# Patient Record
Sex: Female | Born: 1950 | Race: White | Hispanic: No | Marital: Married | Smoking: Never smoker
Health system: Southern US, Community
[De-identification: ages and names within clinical notes are randomized; demographics above are authoritative.]

## PROBLEM LIST (undated history)

## (undated) DIAGNOSIS — N184 Chronic kidney disease, stage 4 (severe): Secondary | ICD-10-CM

## (undated) DIAGNOSIS — K529 Noninfective gastroenteritis and colitis, unspecified: Secondary | ICD-10-CM

## (undated) DIAGNOSIS — G2401 Drug induced subacute dyskinesia: Secondary | ICD-10-CM

## (undated) DIAGNOSIS — R569 Unspecified convulsions: Secondary | ICD-10-CM

## (undated) DIAGNOSIS — N183 Chronic kidney disease, stage 3 unspecified: Secondary | ICD-10-CM

## (undated) DIAGNOSIS — F4310 Post-traumatic stress disorder, unspecified: Secondary | ICD-10-CM

## (undated) DIAGNOSIS — E785 Hyperlipidemia, unspecified: Secondary | ICD-10-CM

## (undated) DIAGNOSIS — Z853 Personal history of malignant neoplasm of breast: Secondary | ICD-10-CM

## (undated) DIAGNOSIS — F319 Bipolar disorder, unspecified: Secondary | ICD-10-CM

## (undated) DIAGNOSIS — E079 Disorder of thyroid, unspecified: Secondary | ICD-10-CM

## (undated) DIAGNOSIS — H269 Unspecified cataract: Secondary | ICD-10-CM

## (undated) DIAGNOSIS — Z803 Family history of malignant neoplasm of breast: Secondary | ICD-10-CM

## (undated) DIAGNOSIS — G20C Parkinsonism, unspecified: Secondary | ICD-10-CM

## (undated) DIAGNOSIS — N2581 Secondary hyperparathyroidism of renal origin: Secondary | ICD-10-CM

## (undated) DIAGNOSIS — C50919 Malignant neoplasm of unspecified site of unspecified female breast: Secondary | ICD-10-CM

## (undated) DIAGNOSIS — L4050 Arthropathic psoriasis, unspecified: Secondary | ICD-10-CM

## (undated) DIAGNOSIS — K635 Polyp of colon: Secondary | ICD-10-CM

## (undated) HISTORY — DX: Hyperlipidemia, unspecified: E78.5

## (undated) HISTORY — DX: Arthropathic psoriasis, unspecified: L40.50

## (undated) HISTORY — DX: Parkinsonism, unspecified: G20.C

## (undated) HISTORY — DX: Unspecified cataract: H26.9

## (undated) HISTORY — DX: Unspecified convulsions: R56.9

## (undated) HISTORY — DX: Family history of malignant neoplasm of breast: Z80.3

## (undated) HISTORY — DX: Polyp of colon: K63.5

## (undated) HISTORY — DX: Personal history of malignant neoplasm of breast: Z85.3

## (undated) HISTORY — DX: Bipolar disorder, unspecified: F31.9

## (undated) HISTORY — DX: Chronic kidney disease, stage 4 (severe): N18.4

## (undated) HISTORY — PX: CARDIAC CATHETERIZATION: SHX172

## (undated) HISTORY — PX: BREAST SURGERY: SHX581

## (undated) HISTORY — DX: Post-traumatic stress disorder, unspecified: F43.10

## (undated) HISTORY — DX: Disorder of thyroid, unspecified: E07.9

## (undated) HISTORY — PX: EYE SURGERY: SHX253

---

## 1968-03-11 HISTORY — PX: TONSILLECTOMY: SUR1361

## 1971-03-12 HISTORY — PX: DILATION AND CURETTAGE OF UTERUS: SHX78

## 1972-03-11 HISTORY — PX: URETERAL REIMPLANTION: SHX2611

## 1977-03-11 HISTORY — PX: CHOLECYSTECTOMY: SHX55

## 1985-03-11 DIAGNOSIS — F32A Depression, unspecified: Secondary | ICD-10-CM

## 1985-03-11 HISTORY — DX: Depression, unspecified: F32.A

## 1985-03-11 HISTORY — PX: ABDOMINAL HYSTERECTOMY: SHX81

## 1996-04-03 DIAGNOSIS — E039 Hypothyroidism, unspecified: Secondary | ICD-10-CM | POA: Insufficient documentation

## 2004-08-16 DIAGNOSIS — F3160 Bipolar disorder, current episode mixed, unspecified: Secondary | ICD-10-CM | POA: Insufficient documentation

## 2007-05-21 DIAGNOSIS — L719 Rosacea, unspecified: Secondary | ICD-10-CM | POA: Insufficient documentation

## 2010-03-11 DIAGNOSIS — G20 Parkinson's disease: Secondary | ICD-10-CM

## 2010-03-11 HISTORY — DX: Parkinson's disease: G20

## 2014-09-19 HISTORY — PX: MASTECTOMY: SHX3

## 2014-10-31 DIAGNOSIS — M8580 Other specified disorders of bone density and structure, unspecified site: Secondary | ICD-10-CM | POA: Insufficient documentation

## 2014-12-27 DIAGNOSIS — Z853 Personal history of malignant neoplasm of breast: Secondary | ICD-10-CM | POA: Insufficient documentation

## 2015-10-18 DIAGNOSIS — G2401 Drug induced subacute dyskinesia: Secondary | ICD-10-CM | POA: Insufficient documentation

## 2016-01-19 DIAGNOSIS — N183 Chronic kidney disease, stage 3 unspecified: Secondary | ICD-10-CM | POA: Insufficient documentation

## 2016-03-14 DIAGNOSIS — C50812 Malignant neoplasm of overlapping sites of left female breast: Secondary | ICD-10-CM | POA: Insufficient documentation

## 2016-03-14 HISTORY — DX: Estrogen receptor positive status (ER+): C50.812

## 2016-09-12 DIAGNOSIS — R7612 Nonspecific reaction to cell mediated immunity measurement of gamma interferon antigen response without active tuberculosis: Secondary | ICD-10-CM | POA: Insufficient documentation

## 2017-05-28 DIAGNOSIS — Z86010 Personal history of colonic polyps: Secondary | ICD-10-CM | POA: Insufficient documentation

## 2017-05-28 DIAGNOSIS — D126 Benign neoplasm of colon, unspecified: Secondary | ICD-10-CM | POA: Insufficient documentation

## 2018-06-22 ENCOUNTER — Ambulatory Visit: Payer: Self-pay | Admitting: Family Medicine

## 2018-09-01 ENCOUNTER — Ambulatory Visit: Payer: Self-pay | Admitting: Family Medicine

## 2018-09-04 ENCOUNTER — Other Ambulatory Visit: Payer: Self-pay

## 2018-09-04 ENCOUNTER — Encounter: Payer: Self-pay | Admitting: Family Medicine

## 2018-09-04 ENCOUNTER — Ambulatory Visit (INDEPENDENT_AMBULATORY_CARE_PROVIDER_SITE_OTHER): Payer: Medicare Other | Admitting: Family Medicine

## 2018-09-04 VITALS — BP 122/66 | HR 66 | Temp 98.5°F | Resp 16 | Ht 63.5 in | Wt 237.2 lb

## 2018-09-04 DIAGNOSIS — E039 Hypothyroidism, unspecified: Secondary | ICD-10-CM

## 2018-09-04 DIAGNOSIS — C50812 Malignant neoplasm of overlapping sites of left female breast: Secondary | ICD-10-CM

## 2018-09-04 DIAGNOSIS — D696 Thrombocytopenia, unspecified: Secondary | ICD-10-CM | POA: Insufficient documentation

## 2018-09-04 DIAGNOSIS — G2401 Drug induced subacute dyskinesia: Secondary | ICD-10-CM

## 2018-09-04 DIAGNOSIS — K529 Noninfective gastroenteritis and colitis, unspecified: Secondary | ICD-10-CM | POA: Diagnosis not present

## 2018-09-04 DIAGNOSIS — L4050 Arthropathic psoriasis, unspecified: Secondary | ICD-10-CM | POA: Insufficient documentation

## 2018-09-04 DIAGNOSIS — F3160 Bipolar disorder, current episode mixed, unspecified: Secondary | ICD-10-CM | POA: Diagnosis not present

## 2018-09-04 DIAGNOSIS — Z170 Estrogen receptor positive status [ER+]: Secondary | ICD-10-CM

## 2018-09-04 DIAGNOSIS — N183 Chronic kidney disease, stage 3 unspecified: Secondary | ICD-10-CM

## 2018-09-04 DIAGNOSIS — E559 Vitamin D deficiency, unspecified: Secondary | ICD-10-CM | POA: Insufficient documentation

## 2018-09-04 NOTE — Progress Notes (Signed)
Subjective  CC:  Chief Complaint  Patient presents with  . Establish Care    Recent moved to the area from [place]  . Diarrhea    She takes Imodium with relief, wants GI referral  . Referrals    Psychiatrist, Oncologist, Nephrologist, Rheumologist    HPI: Tiffany [name] is a 68 y.o. female who presents to [facility] today to establish care with me as a new patient.   She has the following concerns or needs:  Pleasant 69 year old female who recently relocated from [place] with her husband to be closer to family members with a complicated past medical history.  Fortunately she has not documented her history well and we have updated her chart.  As well records from care everywhere have been reviewed.  Her main problems include history of left breast cancer diagnosed in 2016.  She would like to see her oncologist for this.  She needs a referral.  She has mixed bipolar affective disorder for which she is on multiple mood medications and she needs to see a psychiatrist for this.  Currently she is stable.  She has psoriatic arthritis managed by rheumatology.  Chronic kidney disease, stage IV by report managed by renal.  This is in part due to the coordination of care due to her mood medicines including lithium.  She also has hypothyroidism.  Labs from January were reviewed.  All of her medications are reviewed and updated.  She is in need of nothing new.  Most of her chronic problems are well controlled.  She does have chronic diarrhea since childhood she reports.  This is never been thoroughly evaluated.  She takes Imodium on an as-needed basis.  She has had a colonoscopy which was reportedly normal.  She has no infectious symptoms.  No history of mucoid stool or symptoms consistent with inflammatory disorder.  However, she would like to see a gastroenterologist for further treatment options.  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  She had significant tremor while on Depakote.  She currently has a tremor.  Assessment  1. Chronic diarrhea   2. Psoriatic arthritis ([place])   3. Acquired hypothyroidism   4. Bipolar affective disorder, mixed ([place])   5. Chronic kidney disease, stage III (moderate) (HCC)   6. Malignant neoplasm of overlapping sites of left breast in female, estrogen receptor positive ([place])   7. Thrombocytopenia ([place])   8. Tardive dyskinesia      Plan   Multiple referrals placed.  Education regarding psychiatric follow-up given.  Patient to follow-up with specialist.  She will follow-up with me for her annual physical and acute care needs.  I will review with her records via epic.  No medication changes are made at this time.  Health maintenance screenings are currently up-to-date.  Follow up:  No follow-ups on file. Orders Placed This Encounter  Procedures  . Ambulatory referral to Rheumatology  . Ambulatory referral to Oncology  . Ambulatory referral to Gastroenterology   No orders of the defined types were placed in this encounter.    No flowsheet data found.  We updated and reviewed the patient's past history in detail and it is documented below.  Patient Active Problem List   Diagnosis Date Noted  . Thrombocytopenia ([place]) 09/04/2018    Dr Eugene Garnet - normal on repeat; B12 low   . Vitamin D deficiency 09/04/2018  .  Psoriatic arthritis ([place]) 09/04/2018  . History of colonic polyps 05/28/2017    [id number] [facility] Non-Malignant Neoplastic Polyp Data Region/ Operator [1]: [place]/ Luci Bank, MD Date of Procedure [2]: 05/28/2017 Tubular adenoma(s) [3]: 15 Tubulovillous adenoma(s) [4]: 0 Villous adenoma(s) [5]: 0 Sessile serrated adenoma(s) [6]: 0 High risk hyperplastic polyp(s) [7]: 0 Other (specify) [8]: N/A Colorectal cancer found (Yes/No) [9]: No END [id number]   . Reaction to QuantiFERON-TB test (QFT)  without active tuberculosis 09/12/2016    PPD negative 12/17 CXR 07/22/14   . Malignant neoplasm of overlapping sites of left breast in female, estrogen receptor positive ([place]) 03/14/2016    Dx in 09/2014, s/p bilateral mastectomies and ALND, 0/10 LN. 1.4 cm  Grade I invasive lobular, ER and PR +/Her--, Ki 67 <5% Tried anastrozole for one month, but developed suicidal idea   . Chronic kidney disease, stage III (moderate) ([place]) 01/19/2016  . Tardive dyskinesia 10/18/2015  . HX: breast cancer 12/27/2014  . Osteopenia determined by x-ray 10/31/2014    DEXA at [facility] 08/26/14: T score AP spine 1.8, L femoral neck -1.6, R femoral neck -1.4   . Rosacea 05/21/2007  . Bipolar affective disorder, mixed ([place]) 08/16/2004  . Acquired hypothyroidism 04/03/1996   Health Maintenance  Topic Date Due  . Hepatitis C Screening  1950/05/23  . MAMMOGRAM  09/24/1998  . COLONOSCOPY  09/24/1998  . DEXA SCAN  09/23/2013  . INFLUENZA VACCINE  10/10/2018  . TETANUS/TDAP  05/25/2025  . PNA vac Low Risk Adult  Completed   Immunization History  Administered Date(s) Administered  . Influenza, High Dose Seasonal PF 12/13/2014, 01/22/2016, 11/25/2016, 11/18/2017  . Influenza, Seasonal, Injecte, Preservative Fre 01/22/2016  . PPD Test 02/27/2016  . Pneumococcal Conjugate-13 12/13/2014  . Pneumococcal Polysaccharide-23 02/07/2016  . Tdap 05/26/2015   Current Meds  Medication Sig  . acetaminophen (TYLENOL) 500 MG tablet Take 0.5 tablets by mouth as needed for pain.  . Azelaic Acid 15 % cream Apply topically daily.  . folic acid (FOLVITE) 1 MG tablet Take 2 tablets by mouth daily.    Allergies: Patient is allergic to aripiprazole; methotrexate; amoxicillin-pot clavulanate; cefdinir; etanercept; exemestane; fluoxetine; methylprednisolone acetate; epinephrine; and nitrofurantoin. Past Medical History Patient  has a past medical history of Bipolar 1 disorder ([place]), Depression (1987), Parkinson's disease ([place])  (2012), Psoriatic arthritis ([place]), and PTSD (post-traumatic stress disorder). Past Surgical History Patient  has a past surgical history that includes Tonsillectomy (1970); Dilation and curettage of uterus (1973); Ureteral reimplantion (Bilateral, 1974); Cholecystectomy (1979); Abdominal hysterectomy (1987); and Mastectomy (Bilateral, 09/19/2014). Family History: Patient family history includes Cancer in her mother, sister, and sister; Diabetes in her paternal grandfather; Stroke in her maternal grandfather. Social History:  Patient  reports that she has never smoked. She has never used smokeless tobacco. She reports that she does not drink alcohol or use drugs.  Review of Systems: Constitutional: negative for fever or malaise Ophthalmic: negative for photophobia, double vision or loss of vision Cardiovascular: negative for chest pain, dyspnea on exertion, or new LE swelling Respiratory: negative for SOB or persistent cough Gastrointestinal: negative for abdominal pain, change in bowel habits or melena Genitourinary: negative for dysuria or gross hematuria Musculoskeletal: negative for new gait disturbance or muscular weakness Integumentary: negative for new or persistent rashes Neurological: negative for [name] or stroke symptoms Psychiatric: negative for SI or delusions Allergic/Immunologic: negative for hives  Patient Care Team    Relationship Specialty Notifications Start End  Leamon Arnt, MD PCP - General  Family Medicine  09/04/18     Objective  Vitals: BP 122/66   Pulse 66   Temp 98.5 F (36.9 C) (Oral)   Resp 16   Ht 5' 3.5" (1.613 m)   Wt 237 lb 3.2 oz (107.6 kg)   SpO2 95%   BMI 41.36 kg/m  General:  Well developed, well nourished, no acute distress  Psych:  Alert and oriented, flat mood and affect HEENT:  Normocephalic, atraumatic, non-icteric sclera, PERRL, oropharynx is without mass or exudate, supple neck without adenopathy, mass or thyromegaly Cardiovascular:   RRR without gallop, rub or murmur, nondisplaced PMI Respiratory:  Good breath sounds bilaterally, CTAB with normal respiratory effort Gastrointestinal: normal bowel sounds, soft, non-tender, no noted masses. No HSM MSK: no deformities, contusions. Joints are without erythema or swelling Skin:  Warm, no rashes or suspicious lesions noted Neurologic:    Tremor present, unsteady gait  Commons side effects, risks, benefits, and alternatives for medications and treatment plan prescribed today were discussed, and the patient expressed understanding of the given instructions. Patient is instructed to call or message via MyChart if he/she has any questions or concerns regarding our treatment plan. No barriers to understanding were identified. We discussed Red Flag symptoms and signs in detail. Patient expressed understanding regarding what to do in case of urgent or emergency type symptoms.   Medication list was reconciled, printed and provided to the patient in AVS. Patient instructions and summary information was reviewed with the patient as documented in the AVS. This note was prepared with assistance of Dragon voice recognition software. Occasional wrong-word or sound-a-like substitutions may have occurred due to the inherent limitations of voice recognition software

## 2018-09-04 NOTE — Patient Instructions (Signed)
Please return when it is convenient for you annual physical with lab work. This would be due before January to keep your labs up to date.   It was a pleasure meeting you today! Thank you for choosing [place] to meet your healthcare needs! I truly look forward to working with you. If you have any questions or concerns, please send me a message via Mychart or call the office at [phone].  We will call you with information regarding your referral appointment. Rheumatology and oncology and gastroenterolgy.  If you do not hear from [place] within the next 2 weeks, please let me know. It can take 1-2 weeks to get appointments set up with the specialists.   Psychiatrists  [facility] [address] [place], [place] [address] [phone]  [place], NP [address], [place], [place] [address] [place], [place] [address] [phone]  [facility] Pauline Good, NP Magda Paganini NP [address] [place], [place] [address] [phone]  Counseling centers only:  [facility] [address] [place], [place] Outpatient Services: [facility] [address] [name] [address] [place], [place] [phone]   [facility] [address] [place], [place] [address] [place], [place] [address] [place]  [facility] [address] [place],  [address] [phone]

## 2018-09-07 ENCOUNTER — Encounter: Payer: Self-pay | Admitting: Family Medicine

## 2018-09-10 ENCOUNTER — Telehealth: Payer: Self-pay | Admitting: Oncology

## 2018-09-10 NOTE — Telephone Encounter (Signed)
Received a new patient referral from Dr. Jonni Sanger for hx of brca. Tiffany [name] has beeen cld and scheduled to see Dr. Jana Hakim on 7/27 at 4pm. She's been made aware to arrive 20 minutes early. Address and location to our facility has been provided.

## 2018-09-16 ENCOUNTER — Encounter: Payer: Self-pay | Admitting: Family Medicine

## 2018-09-16 ENCOUNTER — Telehealth: Payer: Self-pay | Admitting: Physical Therapy

## 2018-09-16 ENCOUNTER — Telehealth: Payer: Self-pay | Admitting: Oncology

## 2018-09-16 NOTE — Telephone Encounter (Signed)
Returned call to patient re message left to cancel 7/27 new patient appointment with GM. Left message re receiving request to cancel and asked that patient return call to office if she would like to reschedule.  Also called referring office, Dr. Jonni Sanger and spoke with Stanton Kidney to inform her of patient's cancellation.

## 2018-09-16 NOTE — Telephone Encounter (Signed)
Copied from [place] [id number]. Topic: General - Inquiry >> Sep 16, 2018  2:36 PM Mathis Bud wrote: Reason for CRM: Lenna Sciara from [facility] [place], Virgie Dad, MD office, stating patient canceled her appt for 7/27.  Office wanted PCP to know. Melissa call back [phone]

## 2018-09-17 NOTE — Telephone Encounter (Signed)
Pt has sent a MyChart message, wants to see a female. Tiffany [name] is working on referral

## 2018-09-29 LAB — BASIC METABOLIC PANEL
BUN: 23 — AB (ref 4–21)
Creatinine: 2.4 — AB (ref 0.5–1.1)
Glucose: 86
Potassium: 4.6 (ref 3.4–5.3)
Sodium: 143 (ref 137–147)

## 2018-09-29 LAB — IRON,TIBC AND FERRITIN PANEL
Ferritin: 168
Iron: 65
TIBC: 283
UIBC: 218

## 2018-09-29 LAB — TSH: TSH: 0.92 (ref 0.41–5.90)

## 2018-09-29 LAB — CBC AND DIFFERENTIAL: Hemoglobin: 14.1 (ref 12.0–16.0)

## 2018-10-02 ENCOUNTER — Encounter: Payer: Self-pay | Admitting: Gastroenterology

## 2018-10-02 ENCOUNTER — Ambulatory Visit (INDEPENDENT_AMBULATORY_CARE_PROVIDER_SITE_OTHER): Payer: Medicare Other | Admitting: Gastroenterology

## 2018-10-02 DIAGNOSIS — K529 Noninfective gastroenteritis and colitis, unspecified: Secondary | ICD-10-CM

## 2018-10-02 DIAGNOSIS — Z86010 Personal history of colonic polyps: Secondary | ICD-10-CM

## 2018-10-02 MED ORDER — PLENVU 140 G PO SOLR: 1.0000 | ORAL | 0 refills | Status: DC

## 2018-10-02 NOTE — Progress Notes (Signed)
TELEHEALTH VISIT  Referring Provider: Leamon Arnt, MD Primary Care Physician:  Leamon Arnt, MD   Tele-visit due to COVID-19 pandemic Patient requested visit virtually, consented to the virtual encounter via video enabled telemedicine application Contact made at: 15:00 10/02/18 Patient verified by name and date of birth Location of patient: Hotel while home is being built Location provider: [facility] medical office Names of persons participating: Me, patient, Tinnie Gens CMA Time spent on telehealth visit: 25 minutes I discussed the limitations of evaluation and management by telemedicine. The patient expressed understanding and agreed to proceed.  Reason for Consultation: Chronic Diarrhea   IMPRESSION:  Chronic diarrhea since childhood    - normal colon biopsies 2019    - celiac testing, stool studies for fat, pancreatic elastase, giardia, culture, and O&P performed in 2018 History of colon polyps    - 17 tubular adenomas removed on colonoscopy 05/21/17    - surveillance recommended in 2020 Left-sided diverticulosis Lactose intolerance Tortuous sigmoid colon on prior colonoscopy History of intolerance to conscious sedation in 2004 Family history of colon polyps (sister) Cholecystectomy for cholelithiasis  Given her colonoscopy findings last year, the differential diagnosis of her chronic diarrhea without alarm features includes: irritable bowel syndrome, celiac disease, missed infection (such as giardia), food intolerance, thyroid disorder, post-cholecystectomy/bile acid diarrhea, and other functional GI disease. Functional diarrhea +/- post-cholecystectomy diarrhea seems most likely. She is due a colonoscopy for surveillance given the 17 tubular adenomas removed in 2019.   PLAN: Colonoscopy  PlenVue prep as this was well tolerated in the past Zofran for nausea-induced by the prep Children should start colon cancer screening at age 75 Consider referral to genetic  counseling Continue use of Imodium PRN Consider addition of colestipol if treatment of diarrhea is requested Obtain results from lab and stool studies in [place], [place] in 2018  I consented the patient discussing the risks, benefits, and alternatives to endoscopic evaluation. In particular, we discussed the risks that include, but are not limited to, reaction to medication, cardiopulmonary compromise, bleeding requiring blood transfusion, aspiration resulting in pneumonia, perforation requiring surgery, lack of diagnosis, severe illness requiring hospitalization, and even death. We reviewed the risk of missed lesion including polyps or even cancer. The patient acknowledges these risks and asks that we proceed.  HPI: Tiffany [name] is a 68 y.o. female referred by Dr. Jonni Sanger for further evaluation of diarrhea.  The history is obtained through the patient and review of her electronic health record including [place].  She has psoriatic arthritis, acquired hypothyroidism, bipolar affective disorder, stage III chronic kidney disease, history of breast cancer in 2016status post bilateral mastectomies vitamin D deficiency and thrombocytopenia.  She had a cholecystectomy for symptomatic cholelithiasis 40 years ago. She has a history of colon polyps.  She is recently moved from [place], [place].   Chronic diarrhea since childhood, predating cholecystectomy. Diarrhea described as strong urgency with poorly formed stools. May have no bowel movement for 3-4 days or 3 BM a daily. Used Lomotil as a child.  Uses Imodium as needed as an adult. Stool has been an orange color over the last year. No blood or mucous. No abdominal pain.  No identified triggers except for lactose intolerant with dairy causing diarrhea. No other associated symptoms. No identified exacerbating or relieving features.   Reports normal colonoscopy in 2006, although she was uncomfortable during the exam.   Through CareEverywhere I am  able to see that she saw a gastroenterologist in 2018 and 2019 in  [place] for a change in bowel habits. At that time she developed constipation relieved with MiraLAX despite normal bowel habits more like diarrhea. She had celiac testing, stool studies for fat, pancreatic elastase, giardia, culture, and O&P. The results of the tests are not available in [place]. Occult blood immunoassay stool test was positive x 2.  He recommended a trial of lactose free diet and low FODMAP diet. He recommended fiber supplements for stool bulk. Cholestyramine was considered. Colonoscopy was recommended but she was unable to complete the prep due to nausea. Ultimately had a colonoscopy 05/21/17 at [facility] with Dr. Luci Bank that revealed 17 tubular adenomas (2 transverse, 2 cecal, 5 ascending colon, 4 descending colon, 1 sigmoid, 1 rectum), severe left sided diverticulosis, tortuous sigmoid colon.  Random colon biopsies were negative at that time.  Surveillance colonoscopy recommended in 1 year.  Labs 03/20/2013 showed a TSH of 1.52  Three children. Sister with colon polyps. Sister with Hodgkins Lymphoma and subsequently breast cancer. Sister with lung cancer. Mother with non-hodgkins lymphoma.   No known family history of colon cancer or polyps. No family history of uterine/endometrial cancer, pancreatic cancer or gastric/stomach cancer.  Past Medical History:  Diagnosis Date  . Bipolar 1 disorder ([place])   . Depression 1987  . Parkinson's disease ([place]) 2012  . Psoriatic arthritis ([place])   . PTSD (post-traumatic stress disorder)     Past Surgical History:  Procedure Laterality Date  . ABDOMINAL HYSTERECTOMY  1987  . CHOLECYSTECTOMY  1979  . DILATION AND CURETTAGE OF UTERUS  1973  . MASTECTOMY Bilateral 09/19/2014  . TONSILLECTOMY  1970  . URETERAL REIMPLANTION Bilateral 1974    Current Outpatient Medications  Medication Sig Dispense Refill  . acetaminophen (TYLENOL) 500 MG tablet  Take 0.5 tablets by mouth as needed for pain.    . Azelaic Acid 15 % cream Apply topically daily.    . calcitRIOL (ROCALTROL) 0.25 MCG capsule Take 1 capsule by mouth 3 (three) times a week.    . Certolizumab Pegol 2 X 200 MG KIT Inject 2 each into the skin every 30 (thirty) days.    . Cholecalciferol 50 MCG (2000 UT) TABS Take 1-2 tablets by mouth daily.    . clonazePAM (KLONOPIN) 0.5 MG tablet Take 1-2 tablets by mouth at bedtime.    . cyanocobalamin 100 MCG tablet Take 1 tablet by mouth daily.    . INGREZZA 40 MG CAPS Take 1 capsule by mouth at bedtime.    Marland Kitchen LAMICTAL 100 MG tablet Take 1 tablet by mouth daily as needed.    . lamoTRIgine (LAMICTAL) 25 MG tablet Take 1 tablet by mouth daily as needed.    Marland Kitchen levothyroxine (SYNTHROID) 125 MCG tablet Take 1 tablet by mouth daily.    Marland Kitchen lidocaine (LIDODERM) 5 % Place 1 patch onto the skin every 3 (three) days as needed.    . lithium carbonate (LITHOBID) 300 MG CR tablet Take 1 tablet by mouth daily.    Marland Kitchen LORazepam (ATIVAN) 0.5 MG tablet Take 1 tablet by mouth 2 (two) times daily as needed.    . Multiple Vitamins-Minerals (CENTRUM SILVER 50+WOMEN) TABS Take 1 tablet by mouth daily.    Marland Kitchen pyridOXINE (VITAMIN B-6) 100 MG tablet Take 1 tablet by mouth 2 (two) times a day.    . sertraline (ZOLOFT) 25 MG tablet Take 1 tablet by mouth. 1 tab in the am and 1/2 tab at dinner     No current facility-administered medications for this  visit.     Allergies as of 10/02/2018 - Review Complete 10/02/2018  Allergen Reaction Noted  . Aripiprazole  03/12/2017  . Methotrexate  09/25/2017  . Amoxicillin-pot clavulanate  08/24/2010  . Cefdinir  06/19/2017  . Etanercept  11/24/2017  . Exemestane  04/03/2016  . Fluoxetine  03/12/2017  . Methylprednisolone acetate  10/05/2014  . Epinephrine Palpitations 10/05/2014  . Nitrofurantoin Nausea And Vomiting and Rash 08/24/2010    Family History  Problem Relation Age of Onset  . Cancer Mother   . Cancer Sister   .  Stroke Maternal Grandfather   . Diabetes Paternal Grandfather   . Cancer Sister     Social History   Socioeconomic History  . Marital status: Married    Spouse name: Not on file  . Number of children: Not on file  . Years of education: Not on file  . Highest education level: Not on file  Occupational History  . Not on file  Social Needs  . Financial resource strain: Not on file  . Food insecurity    Worry: Not on file    Inability: Not on file  . Transportation needs    Medical: Not on file    Non-medical: Not on file  Tobacco Use  . Smoking status: Never Smoker  . Smokeless tobacco: Never Used  Substance and Sexual Activity  . Alcohol use: Never    Frequency: Never  . Drug use: Never  . Sexual activity: Not Currently  Lifestyle  . Physical activity    Days per week: Not on file    Minutes per session: Not on file  . Stress: Not on file  Relationships  . Social Herbalist on phone: Not on file    Gets together: Not on file    Attends religious service: Not on file    Active member of club or organization: Not on file    Attends meetings of clubs or organizations: Not on file    Relationship status: Not on file  . Intimate partner violence    Fear of current or ex partner: Not on file    Emotionally abused: Not on file    Physically abused: Not on file    Forced sexual activity: Not on file  Other Topics Concern  . Not on file  Social History Narrative  . Not on file    Review of Systems: ALL ROS discussed and all others negative except listed in HPI.  Physical Exam: Complete physical exam not performed due to the limits inherent in a telehealth encounter.  General: Awake, alert, and oriented, and well communicative. In no acute distress.  HEENT: EOMI, non-icteric sclera, NCAT, MMM  Neck: Normal movement of head and neck  Pulm: No labored breathing, speaking in full sentences without conversational dyspnea  Derm: No apparent lesions or bruising  in visible field  MS: Moves all visible extremities without noticeable abnormality  Psych: Pleasant, cooperative, normal speech, normal affect and normal insight Neuro: Alert and appropriate   Silviano Neuser L. Tarri Glenn, MD, MPH [facility] 10/02/2018, 12:42 PM

## 2018-10-05 ENCOUNTER — Ambulatory Visit: Payer: Medicare Other | Admitting: Oncology

## 2018-10-08 ENCOUNTER — Encounter: Payer: Self-pay | Admitting: Family Medicine

## 2018-11-18 ENCOUNTER — Other Ambulatory Visit: Payer: Self-pay

## 2018-11-18 ENCOUNTER — Ambulatory Visit (INDEPENDENT_AMBULATORY_CARE_PROVIDER_SITE_OTHER): Payer: Medicare Other | Admitting: Neurology

## 2018-11-18 ENCOUNTER — Encounter: Payer: Self-pay | Admitting: Neurology

## 2018-11-18 VITALS — BP 142/85 | HR 66 | Ht 64.0 in | Wt 231.0 lb

## 2018-11-18 DIAGNOSIS — G2119 Other drug induced secondary parkinsonism: Secondary | ICD-10-CM | POA: Diagnosis not present

## 2018-11-18 DIAGNOSIS — G251 Drug-induced tremor: Secondary | ICD-10-CM

## 2018-11-18 NOTE — Progress Notes (Signed)
Subjective:    Patient ID: Tiffany [name] is a 68 y.o. female.  HPI     Star Age, MD, PhD [facility] [address] [place], [place] [address]  Dear Ginny Forth,   I saw your patient, Tiffany [name], upon your kind request to my neurologic clinic today for initial consultation of her tremors.  The patient is accompanied by her husband today. As you know, Tiffany [name] is a 68 year old right-handed woman with an underlying medical history of chronic kidney disease, bipolar disorder, arthritis, hypothyroidism, breast cancer, and obesity, who has had a tremor for the past several years, she has also noticed progressive balance problems and gait problems for the past 8+ years.  I reviewed your office records from 10/16/2018, which you kindly included for her tremor she has tried multiple medications including propranolol which caused bradycardia as understand, she has been on antiparkinsonian medication including benztropine, trihexyphenidyl, Sinemet, selegiline, Requip XL, and Azilect.  She has tried multiple psychotropic medications as well.  She is currently on several psychotropic medications including sertraline, lorazepam, lithium, clonazepam, Ingrezza, Lamictal.  Her BUN was 23, creatinine 2.4 on 09/29/2018.  She reports that she saw a neurologist several years ago, probably in 2012, she was thought to have Parkinson's disease, she was tried on practically all Parkinson's medications at the time and was sent for a DBS evaluation even.  She had the DBS consult at [facility] and then neurologist told her that she has parkinsonism and surgery would not be indicated for this and she also met with a surgeon who told her that for drug-induced parkinsonism there is no recommendation for surgical intervention.  It was recommended at the time that she be taken off of Abilify and Prozac and after she came off of these medications her symptoms improved.  In particular, her gait and  balance improved and she no longer felt the need to use a walker.  Of note, she has been on lithium for the past 15 or 20 years and has also had tardive dyskinesia around her mouth for several years.  She admits that she does not drink water, maybe averages about 2 glasses of water per day, she drinks about 1 cup of iced tea per day, no coffee, occasional soda.  She has not had any recent falls.  Her Past Medical History Is Significant For: Past Medical History:  Diagnosis Date  . Bipolar 1 disorder ([place])   . Depression 1987  . Parkinson's disease ([place]) 2012  . Psoriatic arthritis ([place])   . PTSD (post-traumatic stress disorder)     Her Past Surgical History Is Significant For: Past Surgical History:  Procedure Laterality Date  . ABDOMINAL HYSTERECTOMY  1987  . CHOLECYSTECTOMY  1979  . DILATION AND CURETTAGE OF UTERUS  1973  . MASTECTOMY Bilateral 09/19/2014  . TONSILLECTOMY  1970  . URETERAL REIMPLANTION Bilateral 1974    Her Family History Is Significant For: Family History  Problem Relation Age of Onset  . Cancer Mother   . Cancer Sister   . Stroke Maternal Grandfather   . Diabetes Paternal Grandfather   . Cancer Sister     Her Social History Is Significant For: Social History   Socioeconomic History  . Marital status: Married    Spouse name: Not on file  . Number of children: Not on file  . Years of education: Not on file  . Highest education level: Not on file  Occupational History  . Not on file  Social Needs  . Financial resource strain: Not on file  . Food insecurity    Worry: Not on file    Inability: Not on file  . Transportation needs    Medical: Not on file    Non-medical: Not on file  Tobacco Use  . Smoking status: Never Smoker  . Smokeless tobacco: Never Used  Substance and Sexual Activity  . Alcohol use: Never    Frequency: Never  . Drug use: Never  . Sexual activity: Not Currently  Lifestyle  . Physical activity    Days per week: Not on  file    Minutes per session: Not on file  . Stress: Not on file  Relationships  . Social Herbalist on phone: Not on file    Gets together: Not on file    Attends religious service: Not on file    Active member of club or organization: Not on file    Attends meetings of clubs or organizations: Not on file    Relationship status: Not on file  Other Topics Concern  . Not on file  Social History Narrative  . Not on file    Her Allergies Are:  Allergies  Allergen Reactions  . Aripiprazole     Parkinsonism Parkinsonism   . Methotrexate     Hair loss, severe stomatitis Hair loss, severe stomatitis   . Amoxicillin-Pot Clavulanate     Other reaction(s): Unknown Other reaction(s): Not Known   . Cefdinir     Other reaction(s): Diarrhea Yeast infection and fever; negative c diff  . Etanercept     Headaches Headaches   . Exemestane     Suicidal thoughts with medication Suicidal thoughts with medication   . Fluoxetine   . Methylprednisolone Acetate     Other reaction(s): Other (Specify with Comments) Agitated mania  . Epinephrine Palpitations    Other reaction(s): _0    . Nitrofurantoin Nausea And Vomiting and Rash    Other reaction(s): Rash, non-urticarial Allergy   :   Her Current Medications Are:  Outpatient Encounter Medications as of 11/18/2018  Medication Sig  . acetaminophen (TYLENOL) 500 MG tablet Take 0.5 tablets by mouth as needed for pain.  . Azelaic Acid 15 % cream Apply topically daily.  . calcitRIOL (ROCALTROL) 0.25 MCG capsule Take 1 capsule by mouth 3 (three) times a week.  . Certolizumab Pegol 2 X 200 MG KIT Inject 2 each into the skin every 30 (thirty) days.  . Cholecalciferol 50 MCG (2000 UT) TABS Take 1-2 tablets by mouth daily.  . clonazePAM (KLONOPIN) 0.5 MG tablet Take 1-2 tablets by mouth at bedtime.  . cyanocobalamin 100 MCG tablet Take 1 tablet by mouth daily.  . INGREZZA  40 MG CAPS Take 1 capsule by mouth at bedtime.  Marland Kitchen LAMICTAL 100 MG tablet Take 300 mg by mouth daily as needed.   Marland Kitchen levothyroxine (SYNTHROID) 125 MCG tablet Take 1 tablet by mouth daily.  Marland Kitchen lidocaine 4 % dressing Apply topically.  Marland Kitchen lithium carbonate (LITHOBID) 300 MG CR tablet Take 1 tablet by mouth daily.  Marland Kitchen LORazepam (ATIVAN) 0.5 MG tablet Take 1 tablet by mouth 2 (two) times daily as needed.  Marland Kitchen PEG-KCl-NaCl-NaSulf-Na Asc-C (PLENVU) 140 g SOLR Take 1 kit by mouth as directed.  . sertraline (ZOLOFT) 25 MG tablet Take 2 tablets by mouth daily.   . [DISCONTINUED] lamoTRIgine (LAMICTAL) 25 MG tablet Take 1 tablet by mouth daily as needed.  . [DISCONTINUED] lidocaine (  LIDODERM) 5 % Place 1 patch onto the skin every 3 (three) days as needed.  . [DISCONTINUED] Multiple Vitamins-Minerals (CENTRUM SILVER 50+WOMEN) TABS Take 1 tablet by mouth daily.  . [DISCONTINUED] pyridOXINE (VITAMIN B-6) 100 MG tablet Take 1 tablet by mouth 2 (two) times a day.   No facility-administered encounter medications on file as of 11/18/2018.   :   Review of Systems:  Out of a complete 14 point review of systems, all are reviewed and negative with the exception of these symptoms as listed below:  Review of Systems  Neurological:       Pt presents today to discuss her bilateral tremors. She is right handed. She has tried PD drugs but they didn't help her tremor.    Objective:  Neurological Exam  Physical Exam Physical Examination:   Vitals:   11/18/18 1451  BP: (!) 142/85  Pulse: 66    General Examination: The patient is a very pleasant 68 y.o. female in no acute distress. She appears well-developed and well-nourished and well groomed.   HEENT: Normocephalic, atraumatic, pupils are equal, round and reactive to light and accommodation. Extraocular tracking shows no significant saccadic breakdown.  She has minimal facial masking, perhaps slight hypophonia, no dysarthria, no obvious orofacial dyskinesias.   Airway examination reveals moderate to severe mouth dryness, tongue protrudes centrally in palate elevates symmetrically.  Neck is fairly supple, No significant nuchal rigidity, no carotid bruits.  Hearing is grossly intact.  Chest: Clear to auscultation without wheezing, rhonchi or crackles noted.  Heart: S1+S2+0, regular and normal without murmurs, rubs or gallops noted.   Abdomen: Soft, non-tender and non-distended with normal bowel sounds appreciated on auscultation.  Extremities: There is no pitting edema in the distal lower extremities bilaterally.   Skin: Warm and dry without trophic changes noted.  Musculoskeletal: exam reveals no obvious joint deformities, tenderness or joint swelling or erythema.   Neurologically:  Mental status: The patient is awake, alert and oriented in all 4 spheres. She is somewhat slow in responding at times, otherwise language skills and knowledge are fairly appropriate.  She has a mildly blunt affect.  Cranial nerves II - XII are as described above under HEENT exam.   Motor exam: On Archimedes spiral drawing, she right and left hand, handwriting is legible, not particularly micrographic, mildly tremulous  Normal bulk, strength and tone is noted. No significant rigidity noted, she has a mild and intermittent resting tremor in both upper extremities.  She has a very slight Postural tremor in the left upper extremity, no significant action tremor.  Fine motor skills are fairly well-preserved, very slight slowness noted but otherwise no significant decrement in amplitude with finger taps, hand movements or foot taps.Reflexes are 1+ in the upper extremities, diminished in the lower extremities.Romberg is not tested secondary to safety concerns.  Cerebellar testing: No dysmetria or intention tremor on finger to nose testing. Heel to shin is unremarkable bilaterally, Slightly limited however secondary to decreased range of motion, slightly better with the left leg  than right peer. There is no truncal or gait ataxia.  Sensory exam: intact to light touch in the upper and lower extremities.  Gait, station and balance: She stands with mild difficulty, posture is mildly stooped for age, she walks slowly and cautiously, mild reduction in arm swing noted, no festination, no freezing, no actual shuffling in her gait. She turns slowly.  Balance is mildly impaired  Assessment and Plan:  In summary, Tiffany [name] is a very pleasant 68  y.o.-year old female with an underlying medical history of chronic kidney disease, bipolar disorder, arthritis, hypothyroidism, breast cancer, and obesity, who Presents for evaluation of her tremor and gait disorder, concern for parkinsonism.  Her history and examination suggest drug-induced parkinsonism, she does not have a telltale history or examination findings in keeping with idiopathic Parkinson's disease.  She has tried and failed practically all Parkinson's symptomatic medications and has a whole list of medications she has tried and failed also for her psychiatric disorder.  She has had even DBS consultation in the past and was not deemed an appropriate candidate for DBS.  Unfortunately, there is not a whole lot I can offer her.  Her tremor and parkinsonism can still be secondary to current medications including the lithium and sertraline.  Nevertheless, I do not recommend a change in her psychiatric medication regimen, I will leave this up to you and your recommendations. I would not recommend any symptomatic medication for tremor control for fear of drug interactions and side effects as well as possibility of worsening her balance with something like Mysoline.  She is advised to try to exercise and increase her water intake.  She is currently not hydrating well.  Better hydration can help her balance as well, weight loss may help her mobility too.  I talked to her a little bit about considering a DaTscan but her impaired kidney function does  not make it completely safe for her to have a DaTscan at this time.  For gait safety, she is advised to start using a walker. I will see her back as needed.  I answered all their questions today and the patient and her husband were in agreement. Thank you very much for allowing me to participate in the care of this nice patient. If I can be of any further assistance to you please do not hesitate to call me at [phone].  Sincerely,   Star Age, MD, PhD

## 2018-11-18 NOTE — Patient Instructions (Signed)
You do have mild signs of parkinsonism, likely drug-induced, no obvious signs or history in keeping with idiopathic ("real") Parkinson's disease.  You have unfortunately tried and failed practically all Parkinson's medications and even had surgical evaluation for DBS, for which you were not considered a good candidate in the past.  I would not favor another DBS evaluation at this time. We sometimes consider a special brain scan, called a DaT scan: This is a specialized brain scan designed to help with diagnosis of tremor disorders. A radioactive marker gets injected and the uptake is measured in the brain and compared to normal controls and right side is compared to the left, a change in uptake can help with diagnosis of certain tremor disorders. A brain MRI on the other hand is a brain scan that helps look at the brain structure in more detail overall and look for age-related changes, blood vessel related changes and look for stroke and volume loss which we call atrophy.  However, given your kidney impairment and creatinine level of 2.4 in July, I do not think it is fully safe for you to have the scan done.  For gait safety I recommend that you start using a walker again.  Please continue to exercise on a regular basis.  Please try to lose weight as this may Help with your gait and balance.  In addition, please try to increase your fluid intake, particularly water intake. This can help your balance too. Unfortunately, there is not a whole lot more I can offer you.

## 2018-12-21 ENCOUNTER — Ambulatory Visit (INDEPENDENT_AMBULATORY_CARE_PROVIDER_SITE_OTHER): Payer: Medicare Other | Admitting: Family Medicine

## 2018-12-21 ENCOUNTER — Encounter: Payer: Self-pay | Admitting: Family Medicine

## 2018-12-21 ENCOUNTER — Ambulatory Visit: Payer: Self-pay | Admitting: *Deleted

## 2018-12-21 ENCOUNTER — Ambulatory Visit: Payer: Medicare Other | Admitting: Podiatry

## 2018-12-21 VITALS — Temp 98.6°F

## 2018-12-21 DIAGNOSIS — J069 Acute upper respiratory infection, unspecified: Secondary | ICD-10-CM | POA: Diagnosis not present

## 2018-12-21 NOTE — Telephone Encounter (Signed)
Patient calls with cough for 24 hours with small amount of thick phlegm. Kept her awake last night. Woke up this morning with soreness in the upper center area of chest. Has congestion and fatigue also. No fever. No wheezing. The chest soreness does not radiate but is more prominent with deep breathing. No fever no dizziness No sweating.  Mild nausea yesterday. Has loose stools today but stated is her normal. No contacts/no travels reported but she does have her 76 year old grandchild. Reviewed care advice and call back sxs. Stated she understood. Spoke with [employer]. Has a   Reason for Disposition . [1] Chest pain lasting < 5 minutes AND [2] NO chest pain or cardiac symptoms (e.g., breathing difficulty, sweating) now  Answer Assessment - Initial Assessment Questions 1. LOCATION: "Where does it hurt?"       Center of upper chest 2. RADIATION: "Does the pain go anywhere else?" (e.g., into neck, jaw, arms, back no 3. ONSET: "When did the chest pain begin?" (Minutes, hours or days)      Yesterday  4. PATTERN "Does the pain come and go, or has it been constant since it started?"  "Does it get worse with exertion?"      Comes and goes. 5. DURATION: "How long does it last" (e.g., seconds, minutes, hours)     Longer than one minute-maybe five minutes 6. SEVERITY: "How bad is the pain?"  (e.g., Scale 1-10; mild, moderate, or severe)    - MILD (1-3): doesn't interfere with normal activities     - MODERATE (4-7): interferes with normal activities or awakens from sleep    - SEVERE (8-10): excruciating pain, unable to do any normal activities       Coughing kept her awake only 7. CARDIAC RISK FACTORS: "Do you have any history of heart problems or risk factors for heart disease?" (e.g., angina, prior heart attack; diabetes, high blood pressure, high cholesterol, smoker, or strong family history of heart disease)     None- 8. PULMONARY RISK FACTORS: "Do you have any history of lung disease?"  (e.g., blood  clots in lung, asthma, emphysema, birth control pills)     no 9. CAUSE: "What do you think is causing the chest pain?"     unsure 10. OTHER SYMPTOMS: "Do you have any other symptoms?" (e.g., dizziness, nausea, vomiting, sweating, fever, difficulty breathing, cough)       Mild nausea, diarrhea started yesterday, not completely water. Cough, congestion 11. PREGNANCY: "Is there any chance you are pregnant?" "When was your last menstrual period?"       na  Protocols used: CHEST PAIN-A-AH

## 2018-12-21 NOTE — Progress Notes (Signed)
Virtual Visit via Video Note  Subjective  CC:  Chief Complaint  Patient presents with  . Cough    Mild cough started 12/20/18.Marland Kitchen Reports fatigue and sore throat.. Denies fever, body aches and headaches     I connected with Renato Battles on 12/21/18 at  2:20 PM EDT by a video enabled telemedicine application and verified that I am speaking with the correct person using two identifiers. Location patient: Home Location provider: [facility] [place], Office Persons participating in the virtual visit: Edona Schreffler, Leamon Arnt, MD Lilli Light, [place] discussed the limitations of evaluation and management by telemedicine and the availability of in person appointments. The patient expressed understanding and agreed to proceed. HPI: Tiffany [name] is a 68 y.o. female who was contacted today to address the problems listed above in the chief complaint. . 68 yo female c/o about a 24 hour h/o mild ST, cough, and malaise. No sob, loss of taste nor smell, myalgias, n/v but does have decreased appetite and loose stools w/o blood or mucous. No sick contacts. Has been staying home. Lives with daughter and husband; both are well. Feels risk of covid exposure is low.  Ros: neg urinary sxs Assessment  1. Viral URI      Plan   Viral infection:  Early onset; will monitor sxs; if worsen or increase in cough/fever or sob, will need to be tested for covid. Treat supportively. Home isolate. F/u if needed. Patient understands and agrees with care plan.   I discussed the assessment and treatment plan with the patient. The patient was provided an opportunity to ask questions and all were answered. The patient agreed with the plan and demonstrated an understanding of the instructions.   The patient was advised to call back or seek an in-person evaluation if the symptoms worsen or if the condition fails to improve as anticipated. Follow up: cpe in next 1-3 months.   Visit date not found  No  orders of the defined types were placed in this encounter.     I reviewed the patients updated PMH, FH, and SocHx.    Patient Active Problem List   Diagnosis Date Noted  . Thrombocytopenia ([place]) 09/04/2018  . Vitamin D deficiency 09/04/2018  . Psoriatic arthritis ([place]) 09/04/2018  . History of colonic polyps 05/28/2017  . 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 Malignant neoplasm of overlapping sites of left breast in female, estrogen receptor positive ([place]) 03/14/2016  . Chronic kidney disease, stage III (moderate) 01/19/2016  . Tardive dyskinesia 10/18/2015  . HX: breast cancer 12/27/2014  . Osteopenia determined by x-ray 10/31/2014  . Rosacea 05/21/2007  . Bipolar affective disorder, mixed ([place]) 08/16/2004  . Acquired hypothyroidism 04/03/1996   Current Meds  Medication Sig  . acetaminophen (TYLENOL) 500 MG tablet Take 0.5 tablets by mouth as needed for pain.  . Azelaic Acid 15 % cream Apply topically daily.  . calcitRIOL (ROCALTROL) 0.25 MCG capsule Take 1 capsule by mouth 3 (three) times a week.  . Certolizumab Pegol 2 X 200 MG KIT Inject 2 each into the skin every 30 (thirty) days.  . Cholecalciferol 50 MCG (2000 UT) TABS Take 1-2 tablets by mouth daily.  . clonazePAM (KLONOPIN) 0.5 MG tablet Take 1-2 tablets by mouth at bedtime.  . cyanocobalamin 100 MCG tablet Take 1 tablet by mouth daily.  . INGREZZA 40 MG CAPS Take 1 capsule by mouth at bedtime.  Marland Kitchen  LAMICTAL 100 MG tablet Take 300 mg by mouth daily as needed.   Marland Kitchen levothyroxine (SYNTHROID) 125 MCG tablet Take 1 tablet by mouth daily.  Marland Kitchen lidocaine 4 % dressing Apply topically.  Marland Kitchen lithium carbonate (LITHOBID) 300 MG CR tablet Take 1 tablet by mouth daily.  Marland Kitchen LORazepam (ATIVAN) 0.5 MG tablet Take 1 tablet by mouth 2 (two) times daily as needed.  Marland Kitchen PEG-KCl-NaCl-NaSulf-Na Asc-C (PLENVU) 140 g SOLR Take 1 kit by mouth as directed.  . sertraline (ZOLOFT) 25 MG tablet Take 2 tablets by  mouth daily.     Allergies: Patient is allergic to aripiprazole; methotrexate; amoxicillin-pot clavulanate; cefdinir; etanercept; exemestane; fluoxetine; methylprednisolone acetate; epinephrine; and nitrofurantoin. Family History: Patient family history includes Cancer in her mother, sister, and sister; Diabetes in her paternal grandfather; Stroke in her maternal grandfather. Social History:  Patient  reports that she has never smoked. She has never used smokeless tobacco. She reports that she does not drink alcohol or use drugs.  Review of Systems: Constitutional: Negative for fever malaise or anorexia Cardiovascular: negative for chest pain Respiratory: negative for SOB or persistent cough Gastrointestinal: negative for abdominal pain  OBJECTIVE Vitals: Temp 98.6 F (37 C) (Oral)  General: no acute distress , A&Ox3, no respiratory distress. Appears well  Leamon Arnt, MD

## 2018-12-21 NOTE — Telephone Encounter (Signed)
See note

## 2018-12-23 ENCOUNTER — Ambulatory Visit (INDEPENDENT_AMBULATORY_CARE_PROVIDER_SITE_OTHER): Payer: Medicare Other | Admitting: Podiatry

## 2018-12-23 ENCOUNTER — Other Ambulatory Visit: Payer: Self-pay

## 2018-12-23 ENCOUNTER — Ambulatory Visit (INDEPENDENT_AMBULATORY_CARE_PROVIDER_SITE_OTHER): Payer: Medicare Other

## 2018-12-23 DIAGNOSIS — M79672 Pain in left foot: Secondary | ICD-10-CM

## 2018-12-23 DIAGNOSIS — M778 Other enthesopathies, not elsewhere classified: Secondary | ICD-10-CM

## 2018-12-23 DIAGNOSIS — L989 Disorder of the skin and subcutaneous tissue, unspecified: Secondary | ICD-10-CM

## 2018-12-27 NOTE — Progress Notes (Signed)
   Subjective: 68 y.o. female presenting to the office today as a new patient, referred by Dr. Gershon Mussel, with a chief complaint of an interdigital corn on the left fourth digit that has been present for the past several months. Wearing shoes and walking increases the pain. She has not done anything at home for treatment. Patient is here for further evaluation and treatment.    Past Medical History:  Diagnosis Date  . Bipolar 1 disorder ([place])   . Depression 1987  . Parkinson's disease ([place]) 2012  . Psoriatic arthritis ([place])   . PTSD (post-traumatic stress disorder)      Objective:  Physical Exam General: Alert and oriented x3 in no acute distress  Dermatology: Hyperkeratotic lesion(s) present on the left fourth toe. Pain on palpation with a central nucleated core noted. Skin is warm, dry and supple bilateral lower extremities. Negative for open lesions or macerations.  Vascular: Palpable pedal pulses bilaterally. No edema or erythema noted. Capillary refill within normal limits.  Neurological: Epicritic and protective threshold grossly intact bilaterally.   Musculoskeletal Exam: Pain on palpation at the keratotic lesion(s) noted. Range of motion within normal limits bilateral. Muscle strength 5/5 in all groups bilateral.  Radiographic Exam: Hypertrophic head of proximal phalanx 4th toe left.   Assessment: 1. 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 Patient evaluated. X-Rays reviewed.  2. Excisional debridement of keratoic lesion(s) using a chisel blade was performed without incident.  3. Dressed area with light dressing. 4. Recommended OTC corn and callus remover.  5. Discussed possible 4th toe PIPJ arthroplasty if symptoms recur.  6. Patient is to return to the clinic PRN.   Son works for [employer].   Edrick Kins, DPM [facility]  Dr. Edrick Kins, [address]                                        [place], [place]  [address]                Office [phone]  Fax [phone]

## 2018-12-29 ENCOUNTER — Other Ambulatory Visit: Payer: Self-pay | Admitting: Podiatry

## 2018-12-29 DIAGNOSIS — M778 Other enthesopathies, not elsewhere classified: Secondary | ICD-10-CM

## 2018-12-31 ENCOUNTER — Ambulatory Visit (INDEPENDENT_AMBULATORY_CARE_PROVIDER_SITE_OTHER): Payer: Medicare Other | Admitting: Adult Health

## 2018-12-31 ENCOUNTER — Encounter: Payer: Self-pay | Admitting: Adult Health

## 2018-12-31 ENCOUNTER — Other Ambulatory Visit: Payer: Self-pay

## 2018-12-31 VITALS — BP 137/76 | HR 74 | Ht 64.0 in | Wt 225.0 lb

## 2018-12-31 DIAGNOSIS — F319 Bipolar disorder, unspecified: Secondary | ICD-10-CM

## 2018-12-31 DIAGNOSIS — Z79899 Other long term (current) drug therapy: Secondary | ICD-10-CM | POA: Diagnosis not present

## 2018-12-31 DIAGNOSIS — G4700 Insomnia, unspecified: Secondary | ICD-10-CM | POA: Diagnosis not present

## 2018-12-31 DIAGNOSIS — F4310 Post-traumatic stress disorder, unspecified: Secondary | ICD-10-CM | POA: Diagnosis not present

## 2018-12-31 DIAGNOSIS — F411 Generalized anxiety disorder: Secondary | ICD-10-CM

## 2018-12-31 NOTE — Progress Notes (Signed)
[facility] MD/PA/NP Initial Note  01/01/2019 7:12 PM Tiffany [name]  MRN:  [id number]  Chief Complaint:   HPI:   Accompanied by husband  Describes mood today as "so-so". Pleasant. Tearful at times throughout interview. Mood symptoms - reports depression. Getting anxious and irritable at times. Stating "I'm not doing as well as I had been". Stating "I have had a major breakdown over the past week". Crying "a lot". Stating "I battle with suicidal thoughts". Is usually able to work through the thoughts. Her previous psychiatrist was also her therapist and was able to work with her during those times. Would like to establish a therapist and work on trauma issues from her past/ Feels like symptoms may be related to moving from [place] to [place]. She and husband moved to [place] to be closer to their children. They have recently built a house and she would normally be excited about decorating it - but has no "desire". Due to Covid-19, she hasn't been able to see some of her children. Stating "I want to see my children and grandchildren. Stating I haven't seen them in over a year. Feels like current medications may need to be "tweaked". Has records from previous psychiatrist. Concerned about seeing a new psychiatric provider due to her history. Has been "through a lot" and has experienced a lot of complications over the years. Feels like current medication regimen needs to be "tweaked". Decreased interest and motivation. Taking medications as prescribed.  Energy levels lower.  Active, does not have a regular exercise routine. Disabled Enjoys some usual interests and activities. Spending time with family - husband. Appetite adequate. Weight stable.  Sleeps well most nights. Averages 6 to 8 hours. Focus and concentration difficulties at times. Completing tasks. Managing aspects of household.  Denies SI or HI. Denies AH or VH.  Previous medications: Celexa, Zyprexa, Tegretol, Depakote,  Serzone, Topamax, Seroquel, Effexor, Lexapro, Desipramine, Neurontin, Abilify, Geodon, Propanolo, Cymbalta, Cogentin, Trihexyphenadyl, Simet, Provigil, Selegiline, Requip, Amantadine, Prozac, Mirapex, Azilect, Mteoclopramide, Baclofen, Artane, Namenda  Visit Diagnosis:    ICD-10-CM   1. High risk medication use  Z79.899 Lithium level  2. Insomnia, unspecified type  G47.00   3. PTSD (post-traumatic stress disorder)  F43.10   4. Generalized anxiety disorder  F41.1   5. Bipolar I disorder ([place])  F31.9     Past Psychiatric History:   Past Medical History:  Past Medical History:  Diagnosis Date  . Bipolar 1 disorder ([place])   . Depression 1987  . Parkinson's disease ([place]) 2012  . Psoriatic arthritis ([place])   . PTSD (post-traumatic stress disorder)     Past Surgical History:  Procedure Laterality Date  . ABDOMINAL HYSTERECTOMY  1987  . CHOLECYSTECTOMY  1979  . DILATION AND CURETTAGE OF UTERUS  1973  . MASTECTOMY Bilateral 09/19/2014  . TONSILLECTOMY  1970  . URETERAL REIMPLANTION Bilateral 1974    Family Psychiatric History: Undiagnosed   Family History:  Family History  Problem Relation Age of Onset  . Cancer Mother   . Cancer Sister   . Stroke Maternal Grandfather   . Diabetes Paternal Grandfather   . Cancer Sister     Social History:  Social History   Socioeconomic History  . Marital status: Married    Spouse name: Not on file  . Number of children: Not on file  . Years of education: Not on file  . Highest education level: Not on file  Occupational History  . Not on file  Social Needs  .  Financial resource strain: Not on file  . Food insecurity    Worry: Not on file    Inability: Not on file  . Transportation needs    Medical: Not on file    Non-medical: Not on file  Tobacco Use  . Smoking status: Never Smoker  . Smokeless tobacco: Never Used  Substance and Sexual Activity  . Alcohol use: Never    Frequency: Never  . Drug use: Never  . Sexual activity:  Not Currently  Lifestyle  . Physical activity    Days per week: Not on file    Minutes per session: Not on file  . Stress: Not on file  Relationships  . Social Herbalist on phone: Not on file    Gets together: Not on file    Attends religious service: Not on file    Active member of club or organization: Not on file    Attends meetings of clubs or organizations: Not on file    Relationship status: Not on file  Other Topics Concern  . Not on file  Social History Narrative  . Not on file    Allergies:  Allergies  Allergen Reactions  . Aripiprazole     Parkinsonism Parkinsonism   . Methotrexate     Hair loss, severe stomatitis Hair loss, severe stomatitis   . Amoxicillin-Pot Clavulanate     Other reaction(s): Unknown Other reaction(s): Not Known   . Cefdinir     Other reaction(s): Diarrhea Yeast infection and fever; negative c diff  . Etanercept     Headaches Headaches   . Exemestane     Suicidal thoughts with medication Suicidal thoughts with medication   . Fluoxetine   . Methylprednisolone Acetate     Other reaction(s): Other (Specify with Comments) Agitated mania  . Epinephrine Palpitations    Other reaction(s): _0    . Nitrofurantoin Nausea And Vomiting and Rash    Other reaction(s): Rash, non-urticarial Allergy     Metabolic Disorder Labs: No results found for: HGBA1C, MPG No results found for: PROLACTIN No results found for: CHOL, TRIG, HDL, CHOLHDL, VLDL, LDLCALC Lab Results  Component Value Date   TSH 0.92 09/29/2018    Therapeutic Level Labs: No results found for: LITHIUM No results found for: VALPROATE No components found for:  CBMZ  Current Medications: Current Outpatient Medications  Medication Sig Dispense Refill  . acetaminophen (TYLENOL) 500 MG tablet Take 0.5 tablets by mouth as needed for pain.    . Azelaic Acid 15 % cream Apply topically daily.    .  calcitRIOL (ROCALTROL) 0.25 MCG capsule Take 1 capsule by mouth 3 (three) times a week.    . Certolizumab Pegol 2 X 200 MG KIT Inject 2 each into the skin every 30 (thirty) days.    . Cholecalciferol 50 MCG (2000 UT) TABS Take 1-2 tablets by mouth daily.    . clonazePAM (KLONOPIN) 0.5 MG tablet Take 1-2 tablets by mouth at bedtime.    . cyanocobalamin 100 MCG tablet Take 1 tablet by mouth daily.    . INGREZZA 40 MG CAPS Take 1 capsule by mouth at bedtime.    Marland Kitchen LAMICTAL 100 MG tablet Take 300 mg by mouth daily as needed.     Marland Kitchen levothyroxine (SYNTHROID) 125 MCG tablet Take 1 tablet by mouth daily.    Marland Kitchen lidocaine 4 % dressing Apply topically.    Marland Kitchen lithium carbonate (LITHOBID) 300 MG CR tablet Take 1 tablet by  mouth daily.    Marland Kitchen LORazepam (ATIVAN) 0.5 MG tablet Take 1 tablet by mouth 2 (two) times daily as needed.    Marland Kitchen PEG-KCl-NaCl-NaSulf-Na Asc-C (PLENVU) 140 g SOLR Take 1 kit by mouth as directed. 1 each 0  . sertraline (ZOLOFT) 25 MG tablet Take 2 tablets by mouth daily.      No current facility-administered medications for this visit.     Medication Side Effects: none  Orders placed this visit:   Orders Placed This Encounter  Procedures  . Lithium level    Psychiatric Specialty Exam:  ROS  Blood pressure 137/76, pulse 74, height _0  (1.626 m), weight 225 lb (102.1 kg).Body mass index is 38.62 kg/m.  General Appearance: Neat and Well Groomed  Eye Contact:  Good  Speech:  Clear and Coherent  Volume:  Normal  Mood:  Anxious, Depressed and Irritable  Affect:  Congruent  Thought Process:  Coherent  Orientation:  Full (Time, Place, and Person)  Thought Content: Logical   Suicidal Thoughts:  No  Homicidal Thoughts:  No  Memory:  WNL  Judgement:  Intact  Insight:  Good  Psychomotor Activity:  Normal  Concentration:  Concentration: Fair  Recall:  [place] of Knowledge: Good  Language: Good  Assets:  Communication Skills Desire for Improvement Financial  Resources/Insurance Housing Intimacy Leisure Time Physical Health Resilience Social Support Talents/Skills Transportation Vocational/Educational  ADL's:  Intact  Cognition: WNL  Prognosis:  Good   Screenings: None  Receiving Psychotherapy: No   Treatment Plan/Recommendations:  Plan:  1. Lamictal 373m daily 2. Lithium Carbonate 3068mdaily 3. Increase Zoloft 5062mo 62.5mg68mily for worsening mood symptoms. 4. Clonazepam 1mg 7mhs 5. Lorazepam 0.5mg d41my for anxiety  Order lithium level - lab slip given  Husband to call with medications needed and pharmacy information.   RTC 4 weeks  Patient advised to contact office with any questions, adverse effects, or acute worsening in signs and symptoms.  Counseled patient regarding potential benefits, risks, and side effects of Lamictal to include potential risk of Stevens-Johnson syndrome. Advised patient to stop taking Lamictal and contact office immediately if rash develops and to seek urgent medical attention if rash is severe and/or spreading quickly.  Discussed potential benefits, risk, and side effects of benzodiazepines to include potential risk of tolerance and dependence, as well as possible drowsiness.  Advised patient not to drive if experiencing drowsiness and to take lowest possible effective dose to minimize risk of dependence and tolerance.     Tiffany [name]

## 2019-01-01 ENCOUNTER — Telehealth: Payer: Self-pay | Admitting: Family Medicine

## 2019-01-01 ENCOUNTER — Encounter: Payer: Self-pay | Admitting: *Deleted

## 2019-01-01 DIAGNOSIS — Z79899 Other long term (current) drug therapy: Secondary | ICD-10-CM

## 2019-01-01 NOTE — Telephone Encounter (Signed)
Yes, that is fine. thanks 

## 2019-01-01 NOTE — Telephone Encounter (Signed)
Orders are in but want cross over, is it ok to reorder and schedule nurse visit?

## 2019-01-01 NOTE — Telephone Encounter (Signed)
Patient husband is calling to request an order for lithum level blood test to be ordered. [facility] will like testing completed. Patient's spouse with like to come to [facility].  Please advise with the spouse, [place], [place]- [phone] Or can send a message in the patient's MyChart account. Thank you

## 2019-01-01 NOTE — Telephone Encounter (Signed)
See note

## 2019-01-01 NOTE — Telephone Encounter (Signed)
MyChart sent to pt.

## 2019-01-04 ENCOUNTER — Other Ambulatory Visit: Payer: Self-pay | Admitting: Adult Health

## 2019-01-04 ENCOUNTER — Telehealth: Payer: Self-pay | Admitting: Adult Health

## 2019-01-04 DIAGNOSIS — G4700 Insomnia, unspecified: Secondary | ICD-10-CM

## 2019-01-04 DIAGNOSIS — F319 Bipolar disorder, unspecified: Secondary | ICD-10-CM

## 2019-01-04 DIAGNOSIS — F4310 Post-traumatic stress disorder, unspecified: Secondary | ICD-10-CM

## 2019-01-04 DIAGNOSIS — F411 Generalized anxiety disorder: Secondary | ICD-10-CM

## 2019-01-04 MED ORDER — LORAZEPAM 0.5 MG PO TABS: 0.5000 mg | ORAL_TABLET | Freq: Every day | ORAL | 2 refills | Status: DC | PRN

## 2019-01-04 MED ORDER — SERTRALINE HCL 25 MG PO TABS: 50.0000 mg | ORAL_TABLET | Freq: Every day | ORAL | 1 refills | Status: DC

## 2019-01-04 MED ORDER — LAMOTRIGINE 100 MG PO TABS: ORAL_TABLET | ORAL | 1 refills | Status: DC

## 2019-01-04 MED ORDER — CLONAZEPAM 0.5 MG PO TABS: ORAL_TABLET | ORAL | 2 refills | Status: DC

## 2019-01-04 NOTE — Telephone Encounter (Signed)
Left detailed information and to call back with worsening or no improvement with her symptoms.

## 2019-01-04 NOTE — Telephone Encounter (Signed)
Patient called and said that you wanted you to know that she is no longer sucidal but she is depressed and is crying . Her phone number is [phone]

## 2019-01-05 ENCOUNTER — Other Ambulatory Visit: Payer: Self-pay

## 2019-01-05 MED ORDER — INGREZZA 40 MG PO CAPS: 1.0000 | ORAL_CAPSULE | Freq: Every day | ORAL | 2 refills | Status: DC

## 2019-01-08 ENCOUNTER — Telehealth: Payer: Self-pay | Admitting: Adult Health

## 2019-01-08 LAB — LITHIUM LEVEL: Lithium Lvl: 0.6 mmol/L (ref 0.6–1.2)

## 2019-01-08 NOTE — Telephone Encounter (Signed)
Patient called to check the staus of seeing holly ingram. However, holly can't see her due to her insurance so she wants to know what gina thinks about debbie seeing her since debbie takes her insurance. However, we do need to check with debbie

## 2019-01-08 NOTE — Telephone Encounter (Signed)
Error

## 2019-01-11 NOTE — Telephone Encounter (Signed)
Tiffany [name] would be fine. I was hoping to get her in with one of the therapists that does EMDR.

## 2019-01-14 ENCOUNTER — Telehealth: Payer: Self-pay | Admitting: Adult Health

## 2019-01-14 NOTE — Telephone Encounter (Signed)
Pt called to request increase in Sertraline. Not feeling well. Not getting any better. Also, checking status on therapist. Need therapist ASAP.

## 2019-01-14 NOTE — Telephone Encounter (Signed)
I talked to pt about referrals for therapy today. She has the names.

## 2019-01-15 NOTE — Telephone Encounter (Signed)
Relayed information to [place] and she does have apt with Rollene Fare on Thursday, 12th. She understood to keep same dose and it can be discussed more.  She's mentioned she's not heard back yet from [place] office about scheduling and advised to follow up, not sure their procedure for apts.

## 2019-01-15 NOTE — Telephone Encounter (Signed)
She needs to wait 4 weeks in between dosage changes of an antidepressant to get benefit as a general rule.  I will pass this message on to [facility] and she can give her opinion on the matter.  It does not need to be addressed today.  Patient should wait until next week to get an answer on this from her provider.

## 2019-01-21 ENCOUNTER — Ambulatory Visit (INDEPENDENT_AMBULATORY_CARE_PROVIDER_SITE_OTHER): Payer: Medicare Other | Admitting: Adult Health

## 2019-01-21 ENCOUNTER — Other Ambulatory Visit: Payer: Self-pay

## 2019-01-21 ENCOUNTER — Encounter: Payer: Self-pay | Admitting: Adult Health

## 2019-01-21 DIAGNOSIS — F4310 Post-traumatic stress disorder, unspecified: Secondary | ICD-10-CM

## 2019-01-21 DIAGNOSIS — F319 Bipolar disorder, unspecified: Secondary | ICD-10-CM

## 2019-01-21 DIAGNOSIS — F411 Generalized anxiety disorder: Secondary | ICD-10-CM

## 2019-01-21 DIAGNOSIS — Z79899 Other long term (current) drug therapy: Secondary | ICD-10-CM

## 2019-01-21 DIAGNOSIS — G4700 Insomnia, unspecified: Secondary | ICD-10-CM

## 2019-01-21 NOTE — Progress Notes (Signed)
[facility] MD/PA/NP Medication Check  01/21/2019 1:14 PM Tiffany [name]  MRN:  [id number]  Chief Complaint:   HPI:   Accompanied by husband  Describes mood today as "alright". Pleasant. Tearful at times throughout interview. Mood symptoms - reports decreased depression with increase in Zoloft. Doing "pretty good today - not great, but ok". Had an "upsetting" time a week ago. Had worsening suicidal thoughts last week. Was trying to think of ways to do it. Husband found her in kitchen looking for "pills and knives". Son advised her to go to [facility]. Has been trying to get an appointment set up since last visit. Has been referred to [facility] - has an appointment on December 3rd. Did not feel upset coming to the appointment - now all coming back - tearful. Made not sheets for today - crying and depression, said goodbye to sister and son, episode on 01/15/2019 lasted 3 days, had forgotten to take zoloft on 11/02 and 11/03, was very upset with last appointment with Dr. Matthew Saras. Refused an appointment. Not suicidal today - feeling better. Feels like going up on on the Zoloft was helpful for her. Husband feels like the most recent visit with her previous Psychiatrist and forgetting two days of Sertraline has precipitated the most recent episode. Also thinks her not getting set up with a therapist also contributed. Husband feels like things have "settled down". Improved interest and motivation.Taking medications as prescribed.  Energy levels "a little better".  Active, does not have a regular exercise routine. Disabled Enjoys some usual interests and activities. Spending time with family - husband. Talking to sister and son. Appetite adequate. Weight stable.  Sleeps well most nights. Averages 10 to 12 hours. Increased sleep when depressed. Focus and concentration difficulties "sometimes". Completing tasks. Managing aspects of household.  Denies SI or HI "currently". Denies AH or VH.  Previous  medications: Celexa, Zyprexa, Tegretol, Depakote, Serzone, Topamax, Seroquel, Effexor, Lexapro, Desipramine, Neurontin, Abilify, Geodon, Propanolo, Cymbalta, Cogentin, Trihexyphenadyl, Simet, Provigil, Selegiline, Requip, Amantadine, Prozac, Mirapex, Azilect, Mteoclopramide, Baclofen, Artane, Namenda  Visit Diagnosis:    ICD-10-CM   1. Insomnia, unspecified type  G47.00   2. PTSD (post-traumatic stress disorder)  F43.10   3. Generalized anxiety disorder  F41.1   4. Bipolar I disorder ([place])  F31.9   5. High risk medication use  Z79.899     Past Psychiatric History: Noted in chart. Brought in by patient from previous provider.   Past Medical History:  Past Medical History:  Diagnosis Date  . Bipolar 1 disorder ([place])   . Depression 1987  . Parkinson's disease ([place]) 2012  . Psoriatic arthritis ([place])   . PTSD (post-traumatic stress disorder)     Past Surgical History:  Procedure Laterality Date  . ABDOMINAL HYSTERECTOMY  1987  . CHOLECYSTECTOMY  1979  . DILATION AND CURETTAGE OF UTERUS  1973  . MASTECTOMY Bilateral 09/19/2014  . TONSILLECTOMY  1970  . URETERAL REIMPLANTION Bilateral 1974    Family Psychiatric History: Undiagnosed.   Family History:  Family History  Problem Relation Age of Onset  . Cancer Mother   . Cancer Sister   . Stroke Maternal Grandfather   . Diabetes Paternal Grandfather   . Cancer Sister     Social History:  Social History   Socioeconomic History  . Marital status: Married    Spouse name: Not on file  . Number of children: Not on file  . Years of education: Not on file  . Highest education level: Not on  file  Occupational History  . Not on file  Social Needs  . Financial resource strain: Not on file  . Food insecurity    Worry: Not on file    Inability: Not on file  . Transportation needs    Medical: Not on file    Non-medical: Not on file  Tobacco Use  . Smoking status: Never Smoker  . Smokeless tobacco: Never Used  Substance and  Sexual Activity  . Alcohol use: Never    Frequency: Never  . Drug use: Never  . Sexual activity: Not Currently  Lifestyle  . Physical activity    Days per week: Not on file    Minutes per session: Not on file  . Stress: Not on file  Relationships  . Social Herbalist on phone: Not on file    Gets together: Not on file    Attends religious service: Not on file    Active member of club or organization: Not on file    Attends meetings of clubs or organizations: Not on file    Relationship status: Not on file  Other Topics Concern  . Not on file  Social History Narrative  . Not on file    Allergies:  Allergies  Allergen Reactions  . Aripiprazole     Parkinsonism Parkinsonism   . Methotrexate     Hair loss, severe stomatitis Hair loss, severe stomatitis   . Amoxicillin-Pot Clavulanate     Other reaction(s): Unknown Other reaction(s): Not Known   . Cefdinir     Other reaction(s): Diarrhea Yeast infection and fever; negative c diff  . Etanercept     Headaches Headaches   . Exemestane     Suicidal thoughts with medication Suicidal thoughts with medication   . Fluoxetine   . Methylprednisolone Sodium Succ     Other reaction(s): Other (Specify with Comments) Agitated mania  . Epinephrine Palpitations    Other reaction(s): _0    . Nitrofurantoin Nausea And Vomiting and Rash    Other reaction(s): Rash, non-urticarial Allergy     Metabolic Disorder Labs: No results found for: HGBA1C, MPG No results found for: PROLACTIN No results found for: CHOL, TRIG, HDL, CHOLHDL, VLDL, LDLCALC Lab Results  Component Value Date   TSH 0.92 09/29/2018    Therapeutic Level Labs: Lab Results  Component Value Date   LITHIUM 0.6 01/07/2019   No results found for: VALPROATE No components found for:  CBMZ  Current Medications: Current Outpatient Medications  Medication Sig Dispense Refill  .  acetaminophen (TYLENOL) 500 MG tablet Take 0.5 tablets by mouth as needed for pain.    . Azelaic Acid 15 % cream Apply topically daily.    . calcitRIOL (ROCALTROL) 0.25 MCG capsule Take 1 capsule by mouth 3 (three) times a week.    . Certolizumab Pegol 2 X 200 MG KIT Inject 2 each into the skin every 30 (thirty) days.    . Cholecalciferol 50 MCG (2000 UT) TABS Take 1-2 tablets by mouth daily.    . clonazePAM (KLONOPIN) 0.5 MG tablet Take 1 to 2 tablets at bedtime. 60 tablet 2  . cyanocobalamin 100 MCG tablet Take 1 tablet by mouth daily.    . INGREZZA 40 MG CAPS Take 1 capsule by mouth at bedtime. 30 capsule 2  . lamoTRIgine (LAMICTAL) 100 MG tablet Take three tablets daily. 270 tablet 1  . levothyroxine (SYNTHROID) 125 MCG tablet Take 1 tablet by mouth daily.    Marland Kitchen  lidocaine 4 % dressing Apply topically.    Marland Kitchen lithium carbonate (LITHOBID) 300 MG CR tablet Take 1 tablet by mouth daily.    Marland Kitchen LORazepam (ATIVAN) 0.5 MG tablet Take 1 tablet (0.5 mg total) by mouth daily as needed. 30 tablet 2  . PEG-KCl-NaCl-NaSulf-Na Asc-C (PLENVU) 140 g SOLR Take 1 kit by mouth as directed. 1 each 0  . sertraline (ZOLOFT) 25 MG tablet Take 2 tablets (50 mg total) by mouth daily. 180 tablet 1   No current facility-administered medications for this visit.     Medication Side Effects: none  Orders placed this visit:   No orders of the defined types were placed in this encounter.   Psychiatric Specialty Exam:  ROS  There were no vitals taken for this visit.There is no height or weight on file to calculate BMI.  General Appearance: Neat and Well Groomed  Eye Contact:  Good  Speech:  Clear and Coherent  Volume:  Normal  Mood:  Anxious, Depressed and Irritable - doing better  Affect:  Congruent  Thought Process:  Coherent  Orientation:  Full (Time, Place, and Person)  Thought Content: Logical   Suicidal Thoughts:  No  Homicidal Thoughts:  No  Memory:  WNL  Judgement:  Intact  Insight:  Good   Psychomotor Activity:  Normal  Concentration:  Concentration: Fair  Recall:  [place] of Knowledge: Good  Language: Good  Assets:  Communication Skills Desire for Improvement Financial Resources/Insurance Housing Intimacy Leisure Time Physical Health Resilience Social Support Talents/Skills Transportation Vocational/Educational  ADL's:  Intact  Cognition: WNL  Prognosis:  Good   Screenings: None  Receiving Psychotherapy: No   Treatment Plan/Recommendations:  Plan:  1. Lamictal 352m daily 2. Lithium Carbonate 3030mdaily 3. Increase Zoloft 5034mo 62.5mg33mily for worsening mood symptoms. 4. Clonazepam 1mg 30mhs 5. Lorazepam 0.5mg d32my for anxiety  Lithium level - 0.6 on 01/11/2019  RTC 4 weeks  Patient advised to contact office with any questions, adverse effects, or acute worsening in signs and symptoms.  Counseled patient regarding potential benefits, risks, and side effects of Lamictal to include potential risk of Stevens-Johnson syndrome. Advised patient to stop taking Lamictal and contact office immediately if rash develops and to seek urgent medical attention if rash is severe and/or spreading quickly.  Discussed potential benefits, risk, and side effects of benzodiazepines to include potential risk of tolerance and dependence, as well as possible drowsiness.  Advised patient not to drive if experiencing drowsiness and to take lowest possible effective dose to minimize risk of dependence and tolerance.   ReginaAloha Gell

## 2019-01-29 ENCOUNTER — Other Ambulatory Visit: Payer: Self-pay

## 2019-01-29 ENCOUNTER — Ambulatory Visit (INDEPENDENT_AMBULATORY_CARE_PROVIDER_SITE_OTHER): Payer: Medicare Other

## 2019-01-29 DIAGNOSIS — Z0000 Encounter for general adult medical examination without abnormal findings: Secondary | ICD-10-CM

## 2019-01-29 NOTE — Progress Notes (Signed)
This visit is being conducted via phone call due to the COVID-19 pandemic. This patient has given me verbal consent via phone to conduct this visit, patient states they are participating from their home address. Some vital signs may be absent or patient reported.   Patient identification: identified by name, DOB, and current address.  Location provider: [facility], Office Persons participating in the virtual visit: Denman George LPN and Dr. Billey Chang     Subjective:   Tiffany [name] is a 67 y.o. female who presents for an Initial Medicare Annual Wellness Visit.  Review of Systems     Cardiac Risk Factors include: advanced age (>54mn, >>58women)     Objective:    There were no vitals filed for this visit. There is no height or weight on file to calculate BMI.  Advanced Directives 01/29/2019  Does Patient Have a Medical Advance Directive? Yes  Type of [place]  Does patient want to make changes to medical advance directive? No - Patient declined  Copy of [place] Chart? No - copy requested    Current Medications (verified) Outpatient Encounter Medications as of 01/29/2019  Medication Sig  . acetaminophen (TYLENOL) 500 MG tablet Take 0.5 tablets by mouth as needed for pain.  . Azelaic Acid 15 % cream Apply topically daily.  . calcitRIOL (ROCALTROL) 0.25 MCG capsule Take 1 capsule by mouth 3 (three) times a week.  . Certolizumab Pegol 2 X 200 MG KIT Inject 2 each into the skin every 30 (thirty) days.  . Cholecalciferol 50 MCG (2000 UT) TABS Take 1-2 tablets by mouth daily.  . clonazePAM (KLONOPIN) 0.5 MG tablet Take 1 to 2 tablets at bedtime.  . cyanocobalamin 100 MCG tablet Take 1 tablet by mouth daily.  . INGREZZA 40 MG CAPS Take 1 capsule by mouth at bedtime.  . lamoTRIgine (LAMICTAL) 100 MG tablet Take three tablets daily.  .Marland Kitchenlevothyroxine (SYNTHROID) 125 MCG tablet Take 1 tablet by mouth daily.  .Marland Kitchen lidocaine 4 % dressing Apply topically.  .Marland Kitchenlithium carbonate (LITHOBID) 300 MG CR tablet Take 1 tablet by mouth daily.  .Marland KitchenLORazepam (ATIVAN) 0.5 MG tablet Take 1 tablet (0.5 mg total) by mouth daily as needed.  .Marland KitchenPEG-KCl-NaCl-NaSulf-Na Asc-C (PLENVU) 140 g SOLR Take 1 kit by mouth as directed.  . sertraline (ZOLOFT) 25 MG tablet Take 2 tablets (50 mg total) by mouth daily.   No facility-administered encounter medications on file as of 01/29/2019.     Allergies (verified) Aripiprazole, Methotrexate, Amoxicillin-pot clavulanate, Cefdinir, Etanercept, Exemestane, Fluoxetine, Methylprednisolone sodium succ, Epinephrine, and Nitrofurantoin   History: Past Medical History:  Diagnosis Date  . Bipolar 1 disorder ([place]   . Depression 1987  . Parkinson's disease ([place] 2012  . Psoriatic arthritis ([place]   . PTSD (post-traumatic stress disorder)    Past Surgical History:  Procedure Laterality Date  . ABDOMINAL HYSTERECTOMY  1987  . CHOLECYSTECTOMY  1979  . DILATION AND CURETTAGE OF UTERUS  1973  . MASTECTOMY Bilateral 09/19/2014  . TONSILLECTOMY  1970  . URETERAL REIMPLANTION Bilateral 1974   Family History  Problem Relation Age of Onset  . Cancer Mother   . Cancer Sister   . Stroke Maternal Grandfather   . Diabetes Paternal Grandfather   . Cancer Sister    Social History   Socioeconomic History  . Marital status: Married    Spouse name: Not on file  . Number of children: Not on  file  . Years of education: Not on file  . Highest education level: Not on file  Occupational History  . Not on file  Social Needs  . Financial resource strain: Not on file  . Food insecurity    Worry: Not on file    Inability: Not on file  . Transportation needs    Medical: Not on file    Non-medical: Not on file  Tobacco Use  . Smoking status: Never Smoker  . Smokeless tobacco: Never Used  Substance and Sexual Activity  . Alcohol use: Never    Frequency: Never  . Drug use: Never  . Sexual  activity: Not Currently  Lifestyle  . Physical activity    Days per week: Not on file    Minutes per session: Not on file  . Stress: Not on file  Relationships  . Social Herbalist on phone: Not on file    Gets together: Not on file    Attends religious service: Not on file    Active member of club or organization: Not on file    Attends meetings of clubs or organizations: Not on file    Relationship status: Not on file  Other Topics Concern  . Not on file  Social History Narrative   Moved to area from [place] 08/2018    Tobacco Counseling Counseling given: Not Answered   Clinical Intake:  Pre-visit preparation completed: Yes  Pain : No/denies pain  Diabetes: No  How often do you need to have someone help you when you read instructions, pamphlets, or other written materials from your doctor or pharmacy?: 1 - Never  Interpreter Needed?: No  Information entered by :: Denman George LPN   Activities of Daily Living In your present state of health, do you have any difficulty performing the following activities: 01/29/2019  Hearing? N  Vision? N  Difficulty concentrating or making decisions? N  Walking or climbing stairs? Y  Dressing or bathing? N  Doing errands, shopping? N  Preparing Food and eating ? N  Using the Toilet? N  In the past six months, have you accidently leaked urine? N  Do you have problems with loss of bowel control? N  Managing your Medications? N  Managing your Finances? N  Housekeeping or managing your Housekeeping? N     Immunizations and Health Maintenance Immunization History  Administered Date(s) Administered  . Influenza, High Dose Seasonal PF 12/13/2014, 01/22/2016, 11/25/2016, 11/18/2017  . Influenza, Seasonal, Injecte, Preservative Fre 01/22/2016  . PPD Test 02/27/2016  . Pneumococcal Conjugate-13 12/13/2014  . Pneumococcal Polysaccharide-23 02/07/2016  . Tdap 05/26/2015   Health Maintenance Due  Topic Date Due   . Hepatitis C Screening  1950-12-08  . MAMMOGRAM  09/24/1998  . COLONOSCOPY  09/24/1998  . DEXA SCAN  09/23/2013  . INFLUENZA VACCINE  10/10/2018    Patient Care Team: Leamon Arnt, MD as PCP - General (Family Medicine) Mozingo, Berdie Ogren, NP as Nurse Practitioner (Psychiatry) Thornton Park, MD as Consulting Physician (Gastroenterology) Star Age, MD as Consulting Physician (Neurology) Rhematology, [facility] as Consulting Physician (Rheumatology)  Indicate any recent [place] you may have received from other than [facility] providers in the past year (date may be approximate).     Assessment:   This is a routine wellness examination for [place].  Hearing/Vision screen No exam data present  Dietary issues and exercise activities discussed: Current Exercise Habits: The patient does not participate in regular exercise at present, Exercise limited  by: orthopedic condition(s)  Goals   None    Depression Screen No flowsheet data found.  Fall Risk Fall Risk  01/29/2019 09/04/2018  Falls in the past year? 0 0  Number falls in past yr: - 0  Injury with Fall? 0 0  Risk for fall due to : Impaired mobility -  Follow up Falls evaluation completed;Education provided;Falls prevention discussed Falls evaluation completed    Is the patient's home free of loose throw rugs in walkways, pet beds, electrical cords, etc?   yes      Grab bars in the bathroom? yes      Handrails on the stairs?   yes      Adequate lighting?   yes  Cognitive Function: no cognitive concerns at this time      Cognitive Testing  Alert? Yes         Normal Appearance? N/a  Oriented to person? Yes           Place? Yes  Time? Yes  Recall of three objects? Yes  Can perform simple calculations? Yes  Displays appropriate judgment? Yes  Can read the correct time from a watch face? Yes      Screening Tests Health Maintenance  Topic Date Due  . Hepatitis C Screening  1950/11/21  . MAMMOGRAM   09/24/1998  . COLONOSCOPY  09/24/1998  . DEXA SCAN  09/23/2013  . INFLUENZA VACCINE  10/10/2018  . TETANUS/TDAP  05/25/2025  . PNA vac Low Risk Adult  Completed    Qualifies for Shingles Vaccine? Patient states that she was advised by rheumatology to not have Shingrix vaccine   Cancer Screenings: Lung: Low Dose CT Chest recommended if Age 63-80 years, 30 pack-year currently smoking OR have quit w/in 15years. Patient does not qualify. Breast: Up to date on Mammogram? Yes; no longer indicated due to mastectomy  Up to date of Bone Density/Dexa? Yes Colorectal: last colonoscopy in 2019 with recommendations for repeat in 2020; patient will contact GI     Plan:  I have personally reviewed and addressed the Medicare Annual Wellness questionnaire and have noted the following in the patient's chart:  A. Medical and social history B. Use of alcohol, tobacco or illicit drugs  C. Current medications and supplements D. Functional ability and status E.  Nutritional status F.  Physical activity G. Advance directives H. List of other physicians I.  Hospitalizations, surgeries, and ER visits in previous 12 months J.  [place] such as hearing and vision if needed, cognitive and depression L. Referrals, records requested, and appointments- none   In addition, I have reviewed and discussed with patient certain preventive protocols, quality metrics, and best practice recommendations. A written personalized care plan for preventive services as well as general preventive health recommendations were provided to patient.   Signed,  Denman George, LPN  Nurse Health Advisor   Nurse Notes: no additional

## 2019-01-29 NOTE — Patient Instructions (Signed)
Tiffany [name] , Thank you for taking time to come for your Medicare Wellness Visit. I appreciate your ongoing commitment to your health goals. Please review the following plan we discussed and let me know if I can assist you in the future.   Screening recommendations/referrals: Colorectal Screening: please contact GI to schedule follow up  Mammogram: Not indicated  Bone Density: we will request records   Vision and Dental Exams: Recommended annual ophthalmology exams for early detection of glaucoma and other disorders of the eye Recommended annual dental exams for proper oral hygiene  Vaccinations: Influenza vaccine: completed 01/05/19 Pneumococcal vaccine: up to date; last 02/07/16 Tdap vaccine: up to date; last 05/26/15  Shingles vaccine: Follow recommendations of rheumatologist   Advanced directives: Please bring a copy of your POA (Power of [place]) and/or Living Will to your next appointment.  Goals: Recommend to drink at least 6-8 8oz glasses of water per day and consume a balanced diet rich in fresh fruits and vegetables.   Next appointment: Please schedule your Annual Wellness Visit with your Nurse Health Advisor in one year.  Preventive Care 70 Years and Older, Female Preventive care refers to lifestyle choices and visits with your health care provider that can promote health and wellness. What does preventive care include?  A yearly physical exam. This is also called an annual well check.  Dental exams once or twice a year.  Routine eye exams. Ask your health care provider how often you should have your eyes checked.  Personal lifestyle choices, including:  Daily care of your teeth and gums.  Regular physical activity.  Eating a healthy diet.  Avoiding tobacco and drug use.  Limiting alcohol use.  Practicing safe sex.  Taking low-dose aspirin every day if recommended by your health care provider.  Taking vitamin and mineral supplements as recommended by your  health care provider. What happens during an annual well check? The services and screenings done by your health care provider during your annual well check will depend on your age, overall health, lifestyle risk factors, and family history of disease. Counseling  Your health care provider may ask you questions about your:  Alcohol use.  Tobacco use.  Drug use.  Emotional well-being.  Home and relationship well-being.  Sexual activity.  Eating habits.  History of falls.  Memory and ability to understand (cognition).  Work and work Statistician.  Reproductive health. Screening  You may have the following tests or measurements:  Height, weight, and BMI.  Blood pressure.  Lipid and cholesterol levels. These may be checked every 5 years, or more frequently if you are over 70 years old.  Skin check.  Lung cancer screening. You may have this screening every year starting at age 70 if you have a 30-pack-year history of smoking and currently smoke or have quit within the past 15 years.  Fecal occult blood test (FOBT) of the stool. You may have this test every year starting at age 70.  Flexible sigmoidoscopy or colonoscopy. You may have a sigmoidoscopy every 5 years or a colonoscopy every 10 years starting at age 70.  Hepatitis C blood test.  Hepatitis B blood test.  Sexually transmitted disease (STD) testing.  Diabetes screening. This is done by checking your blood sugar (glucose) after you have not eaten for a while (fasting). You may have this done every 1-3 years.  Bone density scan. This is done to screen for osteoporosis. You may have this done starting at age 65.  Mammogram. This may  be done every 1-2 years. Talk to your health care provider about how often you should have regular mammograms. Talk with your health care provider about your test results, treatment options, and if necessary, the need for more tests. Vaccines  Your health care provider may recommend  certain vaccines, such as:  Influenza vaccine. This is recommended every year.  Tetanus, diphtheria, and acellular pertussis (Tdap, Td) vaccine. You may need a Td booster every 10 years.  Zoster vaccine. You may need this after age 70.  Pneumococcal 13-valent conjugate (PCV13) vaccine. One dose is recommended after age 70.  Pneumococcal polysaccharide (PPSV23) vaccine. One dose is recommended after age 70. Talk to your health care provider about which screenings and vaccines you need and how often you need them. This information is not intended to replace advice given to you by your health care provider. Make sure you discuss any questions you have with your health care provider. Document Released: 03/24/2015 Document Revised: 11/15/2015 Document Reviewed: 12/27/2014 Elsevier Interactive Patient Education  2017 [place] Prevention in the Home Falls can cause injuries. They can happen to people of all ages. There are many things you can do to make your home safe and to help prevent falls. What can I do on the outside of my home?  Regularly fix the edges of walkways and driveways and fix any cracks.  Remove anything that might make you trip as you walk through a door, such as a raised step or threshold.  Trim any bushes or trees on the path to your home.  Use bright outdoor lighting.  Clear any walking paths of anything that might make someone trip, such as rocks or tools.  Regularly check to see if handrails are loose or broken. Make sure that both sides of any steps have handrails.  Any raised decks and porches should have guardrails on the edges.  Have any leaves, snow, or ice cleared regularly.  Use sand or salt on walking paths during winter.  Clean up any spills in your garage right away. This includes oil or grease spills. What can I do in the bathroom?  Use night lights.  Install grab bars by the toilet and in the tub and shower. Do not use towel bars as  grab bars.  Use non-skid mats or decals in the tub or shower.  If you need to sit down in the shower, use a plastic, non-slip stool.  Keep the floor dry. Clean up any water that spills on the floor as soon as it happens.  Remove soap buildup in the tub or shower regularly.  Attach bath mats securely with double-sided non-slip rug tape.  Do not have throw rugs and other things on the floor that can make you trip. What can I do in the bedroom?  Use night lights.  Make sure that you have a light by your bed that is easy to reach.  Do not use any sheets or blankets that are too big for your bed. They should not hang down onto the floor.  Have a firm chair that has side arms. You can use this for support while you get dressed.  Do not have throw rugs and other things on the floor that can make you trip. What can I do in the kitchen?  Clean up any spills right away.  Avoid walking on wet floors.  Keep items that you use a lot in easy-to-reach places.  If you need to reach something above you,  use a strong step stool that has a grab bar.  Keep electrical cords out of the way.  Do not use floor polish or wax that makes floors slippery. If you must use wax, use non-skid floor wax.  Do not have throw rugs and other things on the floor that can make you trip. What can I do with my stairs?  Do not leave any items on the stairs.  Make sure that there are handrails on both sides of the stairs and use them. Fix handrails that are broken or loose. Make sure that handrails are as long as the stairways.  Check any carpeting to make sure that it is firmly attached to the stairs. Fix any carpet that is loose or worn.  Avoid having throw rugs at the top or bottom of the stairs. If you do have throw rugs, attach them to the floor with carpet tape.  Make sure that you have a light switch at the top of the stairs and the bottom of the stairs. If you do not have them, ask someone to add them  for you. What else can I do to help prevent falls?  Wear shoes that:  Do not have high heels.  Have rubber bottoms.  Are comfortable and fit you well.  Are closed at the toe. Do not wear sandals.  If you use a stepladder:  Make sure that it is fully opened. Do not climb a closed stepladder.  Make sure that both sides of the stepladder are locked into place.  Ask someone to hold it for you, if possible.  Clearly mark and make sure that you can see:  Any grab bars or handrails.  First and last steps.  Where the edge of each step is.  Use tools that help you move around (mobility aids) if they are needed. These include:  Canes.  Walkers.  Scooters.  Crutches.  Turn on the lights when you go into a dark area. Replace any light bulbs as soon as they burn out.  Set up your furniture so you have a clear path. Avoid moving your furniture around.  If any of your floors are uneven, fix them.  If there are any pets around you, be aware of where they are.  Review your medicines with your doctor. Some medicines can make you feel dizzy. This can increase your chance of falling. Ask your doctor what other things that you can do to help prevent falls. This information is not intended to replace advice given to you by your health care provider. Make sure you discuss any questions you have with your health care provider. Document Released: 12/22/2008 Document Revised: 08/03/2015 Document Reviewed: 04/01/2014 Elsevier Interactive Patient Education  2017 [employer].

## 2019-02-11 ENCOUNTER — Ambulatory Visit (INDEPENDENT_AMBULATORY_CARE_PROVIDER_SITE_OTHER): Payer: Medicare Other | Admitting: Psychology

## 2019-02-11 ENCOUNTER — Other Ambulatory Visit: Payer: Self-pay

## 2019-02-11 ENCOUNTER — Ambulatory Visit (INDEPENDENT_AMBULATORY_CARE_PROVIDER_SITE_OTHER): Payer: Medicare Other | Admitting: Adult Health

## 2019-02-11 ENCOUNTER — Encounter: Payer: Self-pay | Admitting: Adult Health

## 2019-02-11 DIAGNOSIS — F4310 Post-traumatic stress disorder, unspecified: Secondary | ICD-10-CM

## 2019-02-11 DIAGNOSIS — G4700 Insomnia, unspecified: Secondary | ICD-10-CM

## 2019-02-11 DIAGNOSIS — Z79899 Other long term (current) drug therapy: Secondary | ICD-10-CM

## 2019-02-11 DIAGNOSIS — F319 Bipolar disorder, unspecified: Secondary | ICD-10-CM

## 2019-02-11 DIAGNOSIS — F314 Bipolar disorder, current episode depressed, severe, without psychotic features: Secondary | ICD-10-CM

## 2019-02-11 DIAGNOSIS — F411 Generalized anxiety disorder: Secondary | ICD-10-CM | POA: Diagnosis not present

## 2019-02-11 NOTE — Progress Notes (Signed)
[facility] MD/PA/NP Medication Check  02/11/2019 8:05 PM Tiffany [name]  MRN:  [id number]  Chief Complaint:   HPI:   Accompanied by husband  Describes mood today as "ok". Pleasant. Tearful. Mood symptoms - reports decreased depression with increase in Zoloft, but feels like she is having side effects. Brought in a list she printed out from [place] with side effects circled for review. Wanting to reduce Zoloft from 62.53m to 51m Met with new therapist - [name] - today and feels they are going to "work well" together. Feels more confident about her mental health care. Stating "I'm very hopeful" about things. Denies any suicidal thoughts. Husband feels like she is making progress. Improved interest and motivation. Taking medications as prescribed.  Energy levels "a little better".  Active, does not have a regular exercise routine. Disabled Enjoys some usual interests and activities. Spending time with family - husband. Talking to sister and son. Appetite adequate. Weight loss 10 pounds.  Sleeps well most nights. Averages 10 to 12 hours.  Focus and concentration difficulties "cognitive issues". Completing tasks. Managing aspects of household.  Denies SI or HI. Denies AH or VH.  Previous medications: Celexa, Zyprexa, Tegretol, Depakote, Serzone, Topamax, Seroquel, Effexor, Lexapro, Desipramine, Neurontin, Abilify, Geodon, Propanolo, Cymbalta, Cogentin, Trihexyphenadyl, Simet, Provigil, Selegiline, Requip, Amantadine, Prozac, Mirapex, Azilect, Mteoclopramide, Baclofen, Artane, Namenda  Visit Diagnosis:  No diagnosis found.  Past Psychiatric History: Noted in chart. Brought in by patient from previous provider.   Past Medical History:  Past Medical History:  Diagnosis Date  . Bipolar 1 disorder ([facility]  . Depression 1987  . Parkinson's disease ([facility]  . Psoriatic arthritis ([facility]  . PTSD (post-traumatic stress disorder)     Past Surgical History:  Procedure Laterality Date  .  ABDOMINAL HYSTERECTOMY  1987  . CHOLECYSTECTOMY  1979  . DILATION AND CURETTAGE OF UTERUS  1973  . MASTECTOMY Bilateral 09/19/2014  . TONSILLECTOMY  1970  . URETERAL REIMPLANTION Bilateral 1974    Family Psychiatric History: Undiagnosed.   Family History:  Family History  Problem Relation Age of Onset  . Cancer Mother   . Cancer Sister   . Stroke Maternal Grandfather   . Diabetes Paternal Grandfather   . Cancer Sister     Social History:  Social History   Socioeconomic History  . Marital status: Married    Spouse name: Not on file  . Number of children: Not on file  . Years of education: Not on file  . Highest education level: Not on file  Occupational History  . Not on file  Social Needs  . Financial resource strain: Not on file  . Food insecurity    Worry: Not on file    Inability: Not on file  . Transportation needs    Medical: Not on file    Non-medical: Not on file  Tobacco Use  . Smoking status: Never Smoker  . Smokeless tobacco: Never Used  Substance and Sexual Activity  . Alcohol use: Never    Frequency: Never  . Drug use: Never  . Sexual activity: Not Currently  Lifestyle  . Physical activity    Days per week: Not on file    Minutes per session: Not on file  . Stress: Not on file  Relationships  . Social coHerbalistn phone: Not on file    Gets together: Not on file    Attends religious service: Not on file    Active member of club  or organization: Not on file    Attends meetings of clubs or organizations: Not on file    Relationship status: Not on file  Other Topics Concern  . Not on file  Social History Narrative   Moved to area from [place] 08/2018    Allergies:  Allergies  Allergen Reactions  . Aripiprazole     Parkinsonism Parkinsonism   . Methotrexate     Hair loss, severe stomatitis Hair loss, severe stomatitis   . Amoxicillin-Pot Clavulanate     Other reaction(s): Unknown Other reaction(s): Not Known   .  Cefdinir     Other reaction(s): Diarrhea Yeast infection and fever; negative c diff  . Etanercept     Headaches Headaches   . Exemestane     Suicidal thoughts with medication Suicidal thoughts with medication   . Fluoxetine   . Methylprednisolone Sodium Succ     Other reaction(s): Other (Specify with Comments) Agitated mania  . Epinephrine Palpitations    Other reaction(s): Tachycardia Tachycardia Tachycardia Tachycardia Tachycardia   . Nitrofurantoin Nausea And Vomiting and Rash    Other reaction(s): Rash, non-urticarial Allergy     Metabolic Disorder Labs: No results found for: HGBA1C, MPG No results found for: PROLACTIN No results found for: CHOL, TRIG, HDL, CHOLHDL, VLDL, LDLCALC Lab Results  Component Value Date   TSH 0.92 09/29/2018    Therapeutic Level Labs: Lab Results  Component Value Date   LITHIUM 0.6 01/07/2019   No results found for: VALPROATE No components found for:  CBMZ  Current Medications: Current Outpatient Medications  Medication Sig Dispense Refill  . acetaminophen (TYLENOL) 500 MG tablet Take 0.5 tablets by mouth as needed for pain.    . Azelaic Acid 15 % cream Apply topically daily.    . calcitRIOL (ROCALTROL) 0.25 MCG capsule Take 1 capsule by mouth 3 (three) times a week.    . Certolizumab Pegol 2 X 200 MG KIT Inject 2 each into the skin every 30 (thirty) days.    . Cholecalciferol 50 MCG (2000 UT) TABS Take 1-2 tablets by mouth daily.    . clonazePAM (KLONOPIN) 0.5 MG tablet Take 1 to 2 tablets at bedtime. 60 tablet 2  . cyanocobalamin 100 MCG tablet Take 1 tablet by mouth daily.    . INGREZZA 40 MG CAPS Take 1 capsule by mouth at bedtime. 30 capsule 2  . lamoTRIgine (LAMICTAL) 100 MG tablet Take three tablets daily. 270 tablet 1  . levothyroxine (SYNTHROID) 125 MCG tablet Take 1 tablet by mouth daily.    Marland Kitchen lidocaine 4 % dressing Apply topically.    Marland Kitchen lithium carbonate (LITHOBID) 300 MG CR tablet Take 1 tablet by mouth daily.    Marland Kitchen  LORazepam (ATIVAN) 0.5 MG tablet Take 1 tablet (0.5 mg total) by mouth daily as needed. 30 tablet 2  . PEG-KCl-NaCl-NaSulf-Na Asc-C (PLENVU) 140 g SOLR Take 1 kit by mouth as directed. 1 each 0  . sertraline (ZOLOFT) 25 MG tablet Take 2 tablets (50 mg total) by mouth daily. 180 tablet 1   No current facility-administered medications for this visit.     Medication Side Effects: none  Orders placed this visit:   No orders of the defined types were placed in this encounter.   Psychiatric Specialty Exam:  ROS  There were no vitals taken for this visit.There is no height or weight on file to calculate BMI.  General Appearance: Neat and Well Groomed  Eye Contact:  Good  Speech:  Clear and Coherent  Volume:  Normal  Mood:  Anxious, Depressed and Irritable - doing better  Affect:  Congruent  Thought Process:  Coherent  Orientation:  Full (Time, Place, and Person)  Thought Content: Logical   Suicidal Thoughts:  No  Homicidal Thoughts:  No  Memory:  WNL  Judgement:  Intact  Insight:  Good  Psychomotor Activity:  Normal  Concentration:  Concentration: Fair  Recall:  [place] of Knowledge: Good  Language: Good  Assets:  Communication Skills Desire for Improvement Financial Resources/Insurance Housing Intimacy Leisure Time Physical Health Resilience Social Support Talents/Skills Transportation Vocational/Educational  ADL's:  Intact  Cognition: WNL  Prognosis:  Good   Screenings: None  Receiving Psychotherapy: No   Treatment Plan/Recommendations:  Plan:  1. Lamictal 335m daily 2. Lithium Carbonate 3076mdaily 3. Increase Zoloft 5057mo 62.5mg32mily for worsening mood symptoms. 4. Clonazepam 1mg 65mhs 5. Lorazepam 0.5mg d38my for anxiety 6. Ingrezza 40mg d88m - cannot take a higher dose  Lithium level - 0.6 on 01/11/2019  Met with [name] today - plans to see weekly.   RTC 4 weeks  Patient advised to contact office with any questions, adverse effects,  or acute worsening in signs and symptoms.  Counseled patient regarding potential benefits, risks, and side effects of Lamictal to include potential risk of Stevens-Johnson syndrome. Advised patient to stop taking Lamictal and contact office immediately if rash develops and to seek urgent medical attention if rash is severe and/or spreading quickly.  Discussed potential benefits, risk, and side effects of benzodiazepines to include potential risk of tolerance and dependence, as well as possible drowsiness.  Advised patient not to drive if experiencing drowsiness and to take lowest possible effective dose to minimize risk of dependence and tolerance.   Tiffany [name]

## 2019-02-17 ENCOUNTER — Other Ambulatory Visit: Payer: Self-pay

## 2019-02-17 ENCOUNTER — Ambulatory Visit (INDEPENDENT_AMBULATORY_CARE_PROVIDER_SITE_OTHER): Payer: Medicare Other | Admitting: Podiatry

## 2019-02-17 DIAGNOSIS — L989 Disorder of the skin and subcutaneous tissue, unspecified: Secondary | ICD-10-CM | POA: Diagnosis not present

## 2019-02-17 DIAGNOSIS — M2042 Other hammer toe(s) (acquired), left foot: Secondary | ICD-10-CM | POA: Diagnosis not present

## 2019-02-17 NOTE — Patient Instructions (Addendum)
Pre-Operative Instructions  Congratulations, you have decided to take an important step towards improving your quality of life.  You can be assured that the doctors and staff at [facility] will be with you every step of the way.  Here are some important things you should know:  1. Plan to be at the surgery center/hospital at least 1 (one) hour prior to your scheduled time, unless otherwise directed by the surgical center/hospital staff.  You must have a responsible adult accompany you, remain during the surgery and drive you home.  Make sure you have directions to the surgical center/hospital to ensure you arrive on time. 2. If you are having surgery at [facility] or [facility] hospitals, you will need a copy of your medical history and physical form from your family physician within one month prior to the date of surgery. We will give you a form for your primary physician to complete.  3. We make every effort to accommodate the date you request for surgery.  However, there are times where surgery dates or times have to be moved.  We will contact you as soon as possible if a change in schedule is required.   4. No aspirin/ibuprofen for one week before surgery.  If you are on aspirin, any non-steroidal anti-inflammatory medications (Mobic, Aleve, Ibuprofen) should not be taken seven (7) days prior to your surgery.  You make take Tylenol for pain prior to surgery.  5. Medications - If you are taking daily heart and blood pressure medications, seizure, reflux, allergy, asthma, anxiety, pain or diabetes medications, make sure you notify the surgery center/hospital before the day of surgery so they can tell you which medications you should take or avoid the day of surgery. 6. No food or drink after midnight the night before surgery unless directed otherwise by surgical center/hospital staff. 7. No alcoholic beverages 24-hours prior to surgery.  No smoking 24-hours prior or 24-hours after  surgery. 8. Wear loose pants or shorts. They should be loose enough to fit over bandages, boots, and casts. 9. Don't wear slip-on shoes. Sneakers are preferred. 10. Bring your boot with you to the surgery center/hospital.  Also bring crutches or a walker if your physician has prescribed it for you.  If you do not have this equipment, it will be provided for you after surgery. 11. If you have not been contacted by the surgery center/hospital by the day before your surgery, call to confirm the date and time of your surgery. 12. Leave-time from work may vary depending on the type of surgery you have.  Appropriate arrangements should be made prior to surgery with your employer. 13. Prescriptions will be provided immediately following surgery by your doctor.  Fill these as soon as possible after surgery and take the medication as directed. Pain medications will not be refilled on weekends and must be approved by the doctor. 14. Remove nail polish on the operative foot and avoid getting pedicures prior to surgery. 15. Wash the night before surgery.  The night before surgery wash the foot and leg well with water and the antibacterial soap provided. Be sure to pay special attention to beneath the toenails and in between the toes.  Wash for at least three (3) minutes. Rinse thoroughly with water and dry well with a towel.  Perform this wash unless told not to do so by your physician.  Enclosed: 1 Ice pack (please put in freezer the night before surgery)   1 Hibiclens skin cleaner     Pre-op instructions  If you have any questions regarding the instructions, please do not hesitate to call our office.  [place]: [address], [place], [place] [address] -- [phone]  [place]: [address] [place], [place] [address] -- [phone]  [place]: [address]  [place], [place]  [address] -- [phone]   Website: [email or web address] 

## 2019-02-20 NOTE — Progress Notes (Signed)
   Subjective: 68 y.o. female presenting to the office today for follow up evaluation of left fourth toe pain. She reports continued pain and difficulty with ambulation. She has tried OTC corn and callus remover with no significant relief. Wearing shoes and walking increases her pain. Patient is here for further evaluation and treatment.   Past Medical History:  Diagnosis Date  . Bipolar 1 disorder ([place])   . Depression 1987  . Parkinson's disease ([place]) 2012  . Psoriatic arthritis ([place])   . PTSD (post-traumatic stress disorder)      Objective:  Physical Exam General: Alert and oriented x3 in no acute distress  Dermatology: Hyperkeratotic lesion(s) present on the left fourth toe. Pain on palpation with a central nucleated core noted. Skin is warm, dry and supple bilateral lower extremities. Negative for open lesions or macerations.  Vascular: Palpable pedal pulses bilaterally. No edema or erythema noted. Capillary refill within normal limits.  Neurological: Epicritic and protective threshold grossly intact bilaterally.   Musculoskeletal Exam: Pain on palpation at the keratotic lesion(s) noted. Hammertoe contracture deformity noted to the 4th digits of the left foot. Range of motion within normal limits bilateral. Muscle strength 5/5 in all groups bilateral.   Assessment: 1. 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 Hammertoe contracture fourth digit left    Plan of Care:  1. Patient evaluated.   2. Excisional debridement of keratoic lesion(s) using a chisel blade was performed without incident.  3. Dressed area with light dressing. 4. Today we discussed the conservative versus surgical management of the presenting pathology. The patient opts for surgical management. All possible complications and details of the procedure were explained. All patient questions were answered. No guarantees were expressed or implied. 5. Authorization for in-office surgery was initiated today. Surgery will  consist of PIPJ arthroplasty 4th digit left foot.  6. Return to clinic on the morning of in-office surgery.    Son works for [employer].    Edrick Kins, DPM [facility]  Dr. Edrick Kins, [address]                                        [place], [place] [address]                Office [phone]  Fax [phone]

## 2019-02-24 ENCOUNTER — Ambulatory Visit (INDEPENDENT_AMBULATORY_CARE_PROVIDER_SITE_OTHER): Payer: Medicare Other | Admitting: Psychology

## 2019-02-24 DIAGNOSIS — F314 Bipolar disorder, current episode depressed, severe, without psychotic features: Secondary | ICD-10-CM | POA: Diagnosis not present

## 2019-02-25 ENCOUNTER — Ambulatory Visit: Payer: Medicare Other | Admitting: Adult Health

## 2019-02-25 ENCOUNTER — Encounter: Payer: Self-pay | Admitting: Adult Health

## 2019-02-25 ENCOUNTER — Ambulatory Visit (INDEPENDENT_AMBULATORY_CARE_PROVIDER_SITE_OTHER): Payer: Medicare Other | Admitting: Adult Health

## 2019-02-25 ENCOUNTER — Other Ambulatory Visit: Payer: Self-pay

## 2019-02-25 DIAGNOSIS — F411 Generalized anxiety disorder: Secondary | ICD-10-CM | POA: Diagnosis not present

## 2019-02-25 DIAGNOSIS — G4700 Insomnia, unspecified: Secondary | ICD-10-CM | POA: Diagnosis not present

## 2019-02-25 DIAGNOSIS — F4310 Post-traumatic stress disorder, unspecified: Secondary | ICD-10-CM

## 2019-02-25 DIAGNOSIS — F319 Bipolar disorder, unspecified: Secondary | ICD-10-CM

## 2019-02-25 NOTE — Progress Notes (Signed)
Tiffany [name] [id number] 1951-01-05 68 y.o.  Subjective:   Patient ID:  Tiffany [name] is a 68 y.o. (DOB 1950-05-15) female.  Chief Complaint: No chief complaint on file.   HPI Capria Cartaya presents to the office today for follow-up of PTSD, insomnia,GAD, BPD 1.  Accompanied by husband  Describes mood today as "ok". Pleasant. Tearful. Mood symptoms - feels depressed and anxious - "more so lately". Wanting to try a medication or increase current medications. Mood fluctuates. Wanting to "take poinsetta and throw it against the wall". Stating "I'm mixing things up". Has been crying a lot. Having suicidal thinking - feels like she can keep herself safe. Has no plan to harm herself.  Stating "it's an extension of the way things were going". Talked with therapist yesterday. Plans to see therapist weekly.  Improved interest and motivation. Taking medications as prescribed.  Energy levels low. Active, does not have a regular exercise routine. Disabled Enjoys some usual interests and activities. Married. Lives with husband. Talking to sister and son. Son with recent job promotion. Getting out of the house some.  Appetite adequate. Weight loss  - 10 pounds last visit Sleeps well most nights. Averages 10 to 12 hours.  Focus and concentration difficulties. Stating "it's hard to complete sentences or remember words". Completing tasks. Managing aspects of household - laundry and straightening up.  Denies SI or HI. Denies AH or VH. Sees things out of her periphery.   Previous medications: Celexa, Zyprexa, Tegretol, Depakote, Serzone, Topamax, Seroquel, Effexor, Lexapro, Desipramine, Neurontin, Abilify, Geodon, Propanolol, Cymbalta, Cogentin, Trihexyphenadyl, Simet, Provigil, Selegiline, Requip, Amantadine, Prozac, Mirapex, Azilect, Metoclopramide, Baclofen, Artane, Namenda   Review of Systems:  Review of Systems  Musculoskeletal: Negative for gait problem.  Neurological: Negative for tremors.   Psychiatric/Behavioral:       Please refer to HPI    Medications: I have reviewed the patient's current medications.  Current Outpatient Medications  Medication Sig Dispense Refill  . acetaminophen (TYLENOL) 500 MG tablet Take 0.5 tablets by mouth as needed for pain.    . Azelaic Acid 15 % cream Apply topically daily.    . calcitRIOL (ROCALTROL) 0.25 MCG capsule Take 1 capsule by mouth 3 (three) times a week.    . Certolizumab Pegol 2 X 200 MG KIT Inject 2 each into the skin every 30 (thirty) days.    . Cholecalciferol 50 MCG (2000 UT) TABS Take 1-2 tablets by mouth daily.    . clonazePAM (KLONOPIN) 0.5 MG tablet Take 1 to 2 tablets at bedtime. 60 tablet 2  . cyanocobalamin 100 MCG tablet Take 1 tablet by mouth daily.    . INGREZZA 40 MG CAPS Take 1 capsule by mouth at bedtime. 30 capsule 2  . lamoTRIgine (LAMICTAL) 100 MG tablet Take three tablets daily. 270 tablet 1  . levothyroxine (SYNTHROID) 125 MCG tablet Take 1 tablet by mouth daily.    Marland Kitchen lidocaine 4 % dressing Apply topically.    Marland Kitchen lithium carbonate (LITHOBID) 300 MG CR tablet Take 1 tablet by mouth daily.    Marland Kitchen LORazepam (ATIVAN) 0.5 MG tablet Take 1 tablet (0.5 mg total) by mouth daily as needed. 30 tablet 2  . PEG-KCl-NaCl-NaSulf-Na Asc-C (PLENVU) 140 g SOLR Take 1 kit by mouth as directed. 1 each 0  . sertraline (ZOLOFT) 25 MG tablet Take 2 tablets (50 mg total) by mouth daily. 180 tablet 1   No current facility-administered medications for this visit.    Medication Side Effects: None  Allergies:  Allergies  Allergen Reactions  . Aripiprazole     Parkinsonism Parkinsonism   . Methotrexate     Hair loss, severe stomatitis Hair loss, severe stomatitis   . Amoxicillin-Pot Clavulanate     Other reaction(s): Unknown Other reaction(s): Not Known   . Cefdinir     Other reaction(s): Diarrhea Yeast infection and fever; negative c diff  . Etanercept     Headaches Headaches   . Exemestane     Suicidal thoughts  with medication Suicidal thoughts with medication   . Fluoxetine   . Methylprednisolone Sodium Succ     Other reaction(s): Other (Specify with Comments) Agitated mania  . Epinephrine Palpitations    Other reaction(s): Tachycardia Tachycardia Tachycardia Tachycardia Tachycardia   . Nitrofurantoin Nausea And Vomiting and Rash    Other reaction(s): Rash, non-urticarial Allergy     Past Medical History:  Diagnosis Date  . Bipolar 1 disorder ([place])   . Depression 1987  . Parkinson's disease ([place]) 2012  . Psoriatic arthritis ([place])   . PTSD (post-traumatic stress disorder)     Family History  Problem Relation Age of Onset  . Cancer Mother   . Cancer Sister   . Stroke Maternal Grandfather   . Diabetes Paternal Grandfather   . Cancer Sister     Social History   Socioeconomic History  . Marital status: Married    Spouse name: Not on file  . Number of children: Not on file  . Years of education: Not on file  . Highest education level: Not on file  Occupational History  . Not on file  Tobacco Use  . Smoking status: Never Smoker  . Smokeless tobacco: Never Used  Substance and Sexual Activity  . Alcohol use: Never  . Drug use: Never  . Sexual activity: Not Currently  Other Topics Concern  . Not on file  Social History Narrative   Moved to area from [place] 08/2018   Social Determinants of Health   Financial Resource Strain:   . Difficulty of Paying Living Expenses: Not on file  Food Insecurity:   . Worried About Charity fundraiser in the Last Year: Not on file  . Ran Out of Food in the Last Year: Not on file  Transportation Needs:   . Lack of Transportation (Medical): Not on file  . Lack of Transportation (Non-Medical): Not on file  Physical Activity:   . Days of Exercise per Week: Not on file  . Minutes of Exercise per Session: Not on file  Stress:   . Feeling of Stress : Not on file  Social Connections:   . Frequency of Communication with Friends and  Family: Not on file  . Frequency of Social Gatherings with Friends and Family: Not on file  . Attends Religious Services: Not on file  . Active Member of Clubs or Organizations: Not on file  . Attends Archivist Meetings: Not on file  . Marital Status: Not on file  Intimate Partner Violence:   . Fear of Current or Ex-Partner: Not on file  . Emotionally Abused: Not on file  . Physically Abused: Not on file  . Sexually Abused: Not on file    Past Medical History, Surgical history, Social history, and Family history were reviewed and updated as appropriate.   Please see review of systems for further details on the patient's review from today.   Objective:   Physical Exam:  There were no vitals taken for this visit.  Physical Exam Constitutional:  General: She is not in acute distress.    Appearance: She is well-developed.  Musculoskeletal:        General: No deformity.  Neurological:     Mental Status: She is alert and oriented to person, place, and time.     Coordination: Coordination normal.  Psychiatric:        Attention and Perception: Attention and perception normal. She does not perceive auditory or visual hallucinations.        Mood and Affect: Mood is anxious and depressed. Affect is not labile, blunt, angry or inappropriate.        Speech: Speech normal.        Behavior: Behavior normal.        Thought Content: Thought content is not paranoid or delusional. Thought content includes suicidal ideation. Thought content does not include homicidal ideation. Thought content does not include suicidal plan.        Cognition and Memory: Cognition and memory normal.        Judgment: Judgment normal.     Comments: Insight intact     Lab Review:     Component Value Date/Time   NA 143 09/29/2018 0000   K 4.6 09/29/2018 0000   BUN 23 (A) 09/29/2018 0000   CREATININE 2.4 (A) 09/29/2018 0000       Component Value Date/Time   HGB 14.1 09/29/2018 0000     Lithium Lvl  Date Value Ref Range Status  01/07/2019 0.6 0.6 - 1.2 mmol/L Final     No results found for: PHENYTOIN, PHENOBARB, VALPROATE, CBMZ   .res Assessment: Plan:    Plan:  1. Lamictal 37m daily 2. Lithium Carbonate 3032mdaily 3. Zoloft 5089maily for worsening mood symptoms. 4. Clonazepam 1mg30m hs 5. Lorazepam 0.5mg 3mly for anxiety 6. Ingrezza 40mg 27my - cannot take a higher dose  Add Latuda 20mg -66m tab daily for 3 days, then one tablet daily - 2 weeks of samples given.   Consider Genetic testing  Lithium level - 0.6 on 01/07/2019  Therapist - [name]   RTC 4 weeks  Patient advised to contact office with any questions, adverse effects, or acute worsening in signs and symptoms.  Counseled patient regarding potential benefits, risks, and side effects of Lamictal to include potential risk of Stevens-Johnson syndrome. Advised patient to stop taking Lamictal and contact office immediately if rash develops and to seek urgent medical attention if rash is severe and/or spreading quickly.  Discussed potential benefits, risk, and side effects of benzodiazepines to include potential risk of tolerance and dependence, as well as possible drowsiness.  Advised patient not to drive if experiencing drowsiness and to take lowest possible effective dose to minimize risk of dependence and tolerance.  Diagnoses and all orders for this visit:  Insomnia, unspecified type  PTSD (post-traumatic stress disorder)  Generalized anxiety disorder  Bipolar I disorder (HCC)   [place] see After Visit Summary for patient specific instructions.  Future Appointments  Date Time [place]  1:00 PM [place] None  03/08/2019  7:45 AM [place], Edrick [facility]  03/10/2019 12:00 PM Cottle, Bambi G, LCSW [facility] None  03/15/2019  3:15 PM [place], Edrick [facility]  03/17/2019  1:00 PM Cottle, Bambi  G, LCSW [facility] None  03/22/2019  8:15 AM [place], [facility]  04/05/2019  1:15 PM [place], [facility]  04/27/2019  1:00 PM  Leamon Arnt, MD [facility]    No orders of the defined types were placed in this encounter.   -------------------------------

## 2019-03-01 ENCOUNTER — Ambulatory Visit: Payer: Medicare Other | Admitting: Podiatry

## 2019-03-03 ENCOUNTER — Ambulatory Visit (INDEPENDENT_AMBULATORY_CARE_PROVIDER_SITE_OTHER): Payer: Medicare Other | Admitting: Psychology

## 2019-03-03 DIAGNOSIS — F314 Bipolar disorder, current episode depressed, severe, without psychotic features: Secondary | ICD-10-CM

## 2019-03-08 ENCOUNTER — Encounter: Payer: Self-pay | Admitting: Podiatry

## 2019-03-08 ENCOUNTER — Telehealth: Payer: Self-pay | Admitting: Adult Health

## 2019-03-08 ENCOUNTER — Other Ambulatory Visit: Payer: Self-pay

## 2019-03-08 ENCOUNTER — Ambulatory Visit (INDEPENDENT_AMBULATORY_CARE_PROVIDER_SITE_OTHER): Payer: Medicare Other | Admitting: Podiatry

## 2019-03-08 VITALS — BP 175/92 | HR 67 | Temp 98.0°F | Resp 16

## 2019-03-08 DIAGNOSIS — M2042 Other hammer toe(s) (acquired), left foot: Secondary | ICD-10-CM | POA: Diagnosis not present

## 2019-03-08 MED ORDER — HYDROCODONE-ACETAMINOPHEN 5-325 MG PO TABS: 1.0000 | ORAL_TABLET | Freq: Four times a day (QID) | ORAL | 0 refills | Status: DC | PRN

## 2019-03-08 NOTE — Telephone Encounter (Signed)
Noted  

## 2019-03-08 NOTE — Telephone Encounter (Signed)
Pt left message stating you called to check on her. She is doing okay and Bambi will send you a text.

## 2019-03-09 ENCOUNTER — Telehealth: Payer: Self-pay | Admitting: Adult Health

## 2019-03-09 ENCOUNTER — Other Ambulatory Visit: Payer: Self-pay | Admitting: Podiatry

## 2019-03-09 ENCOUNTER — Telehealth: Payer: Self-pay

## 2019-03-09 ENCOUNTER — Other Ambulatory Visit: Payer: Self-pay

## 2019-03-09 MED ORDER — LATUDA 20 MG PO TABS: ORAL_TABLET | ORAL | 0 refills | Status: DC

## 2019-03-09 MED ORDER — OXYCODONE-ACETAMINOPHEN 5-325 MG PO TABS: 1.0000 | ORAL_TABLET | Freq: Four times a day (QID) | ORAL | 0 refills | Status: DC | PRN

## 2019-03-09 NOTE — Telephone Encounter (Signed)
Pt samples of Latuda 20mg  will run out on Thursday. Pt would like a rx sent in at [facility] in [place].

## 2019-03-09 NOTE — Telephone Encounter (Signed)
Patient's husband is calling back, patient is still in a lot of pain and current pain medication is not helping. They are requesting stronger medication. Please call to advise Thanks  Nira Conn

## 2019-03-09 NOTE — Telephone Encounter (Signed)
Noted  

## 2019-03-09 NOTE — Progress Notes (Signed)
PRN postop pain 

## 2019-03-09 NOTE — Telephone Encounter (Signed)
Patient has called 4 times this morning, she is in extreme pain and states that the hydrocodone 1 every 6 hours is not touching the pain. Yesterday it was helping some. Patient is very tearful and in extreme pain. Please call ASAP to advise  Thanks, Nira Conn

## 2019-03-10 ENCOUNTER — Ambulatory Visit: Payer: Medicare Other | Admitting: Psychology

## 2019-03-15 ENCOUNTER — Ambulatory Visit (INDEPENDENT_AMBULATORY_CARE_PROVIDER_SITE_OTHER): Payer: Medicare Other | Admitting: Podiatry

## 2019-03-15 ENCOUNTER — Ambulatory Visit (INDEPENDENT_AMBULATORY_CARE_PROVIDER_SITE_OTHER): Payer: Medicare Other

## 2019-03-15 ENCOUNTER — Other Ambulatory Visit: Payer: Self-pay

## 2019-03-15 DIAGNOSIS — M2042 Other hammer toe(s) (acquired), left foot: Secondary | ICD-10-CM

## 2019-03-15 DIAGNOSIS — Z9889 Other specified postprocedural states: Secondary | ICD-10-CM

## 2019-03-16 ENCOUNTER — Telehealth: Payer: Self-pay | Admitting: *Deleted

## 2019-03-16 NOTE — Telephone Encounter (Signed)
Pt's husband, [name] states pt was seen for a post op appt yesterday and Dr. Amalia Hailey states he would send an antibiotic to their pharmacy after removing the dressing and checking the surgery foot, there is no antibiotic at their pharmacy.

## 2019-03-16 NOTE — Progress Notes (Signed)
   OPERATIVE REPORT Patient name: Tiffany [name] MRN: [id number] DOB: 04/29/1950  DOS:  03/16/19  Preop Dx: Postop Dx: same  Procedure:  1.  Fourth toe arthroplasty left foot  Surgeon: Edrick Kins DPM  Anesthesia: 50-50 mixture of 2% lidocaine plain with 0.5% Marcaine plain totaling 6 mL infiltrated in the patient's left fourth toe  Hemostasis: Ankle tourniquet inflated to a pressure of 243mmHg after esmarch exsanguination   EBL: Minimal mL Materials: None Injectables: None Pathology: None  Condition: The patient tolerated the procedure and anesthesia well. No complications noted or reported   Justification for procedure: The patient is a 69 y.o. 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 All conservative modalities of been unsuccessful in providing any sort of satisfactory alleviation of symptoms with the patient. The patient was told benefits as well as possible side effects of the surgery. The patient consented for surgical correction.   Procedure in Detail: The patient was brought to the procedure room, placed in the procedure chair in the supine position at which time an aseptic scrub and drape were performed about the patient's respective lower extremity after anesthesia was induced as described above. Attention was then directed to the surgical area where procedure number one commenced.  Procedure #1: Fourth toe arthroplasty left foot A 1.5 cm elliptical incision was planned and made overlying the PIPJ of the fourth toe left foot.  Incision was carried down to the level of bone with care taken to cut clamp ligate and retract away all small neurovascular structures traversing the incision site as well as a transverse tenotomy of the overlying tendon.  The head of the proximal phalanx was freed from surrounding soft tissue and a bone cutter was utilized to resect away the head of the proximal  phalanx as well as the middle phalanx of the respective digit.  Irrigation was then utilized in preparation for primary closure.  4-0 Prolene suture was utilized to reapproximate superficial skin edges.  Dry sterile compressive dressings were then applied to all previously mentioned incision sites about the patient's lower extremity. The tourniquet which was used for hemostasis was deflated. All normal neurovascular responses including pink color and warmth returned all the digits of patient's lower extremity.  The patient was then transferred from the operating room to the recovery room having tolerated the procedure and anesthesia well. All vital signs are stable.  Verbal as well as written instructions were provided for the patient regarding wound care. The patient is to keep the dressings clean dry and intact until they are to follow surgeon Dr. Daylene Katayama in the office upon discharge.   Edrick Kins, DPM [facility]  Dr. Edrick Kins, [address]                                        [place], [place] [address]                Office [phone]  Fax [phone]

## 2019-03-17 ENCOUNTER — Ambulatory Visit (INDEPENDENT_AMBULATORY_CARE_PROVIDER_SITE_OTHER): Payer: Medicare Other | Admitting: Psychology

## 2019-03-17 DIAGNOSIS — F314 Bipolar disorder, current episode depressed, severe, without psychotic features: Secondary | ICD-10-CM | POA: Diagnosis not present

## 2019-03-17 MED ORDER — DOXYCYCLINE HYCLATE 100 MG PO TABS: 100.0000 mg | ORAL_TABLET | Freq: Two times a day (BID) | ORAL | 0 refills | Status: DC

## 2019-03-18 NOTE — Progress Notes (Signed)
   Subjective:  Patient presents today status post 4th toe arthroplasty left. DOS: 03/08/2019. She reports some mild throbbing pain. She reports associated erythema and swelling of the foot. She states she has not been able to elevate the foot which is causing the pain. She has been using the post op shoe as directed. Patient is here for further evaluation and treatment.    Past Medical History:  Diagnosis Date  . Bipolar 1 disorder ([place])   . Depression 1987  . Parkinson's disease ([place]) 2012  . Psoriatic arthritis ([place])   . PTSD (post-traumatic stress disorder)       Objective/Physical Exam Neurovascular status intact.  Skin incisions appear to be well coapted with sutures and staples intact. No sign of infectious process noted. No dehiscence. No active bleeding noted. Moderate edema noted to the surgical extremity.  Radiographic Exam:  Osteotomies sites appear to be stable with routine healing.  Assessment: 1. 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 DOS: 03/08/2019   Plan of Care:  1. Patient was evaluated. X-rays reviewed 2. Recommended Iodine ointment daily with a bandage.  3. Continue using post op shoe.  4. Prescription for Doxycycline 100 mg #14 provided to patient.  5. Return to clinic in one week for suture removal.   Son works for [employer].    Edrick Kins, DPM [facility]  Dr. Edrick Kins, [address]                                        [place], [place] [address]                Office [phone]  Fax [phone]

## 2019-03-19 ENCOUNTER — Telehealth: Payer: Self-pay | Admitting: Podiatry

## 2019-03-19 NOTE — Telephone Encounter (Signed)
"  Im calling about a medication that Dr. Amalia Hailey prescribed for me. The Antibiotic made me sick and I stopped taking it, it gave me uncontrollable diarreha. I was wondering if he could give me something else. I am having my sutures removed on Monday."

## 2019-03-22 ENCOUNTER — Ambulatory Visit (INDEPENDENT_AMBULATORY_CARE_PROVIDER_SITE_OTHER): Payer: Medicare Other | Admitting: Podiatry

## 2019-03-22 ENCOUNTER — Ambulatory Visit (INDEPENDENT_AMBULATORY_CARE_PROVIDER_SITE_OTHER): Payer: Medicare Other

## 2019-03-22 ENCOUNTER — Other Ambulatory Visit: Payer: Self-pay

## 2019-03-22 DIAGNOSIS — M2042 Other hammer toe(s) (acquired), left foot: Secondary | ICD-10-CM

## 2019-03-22 DIAGNOSIS — M792 Neuralgia and neuritis, unspecified: Secondary | ICD-10-CM

## 2019-03-22 DIAGNOSIS — G5792 Unspecified mononeuropathy of left lower limb: Secondary | ICD-10-CM

## 2019-03-22 DIAGNOSIS — Z9889 Other specified postprocedural states: Secondary | ICD-10-CM

## 2019-03-22 DIAGNOSIS — M79672 Pain in left foot: Secondary | ICD-10-CM

## 2019-03-23 ENCOUNTER — Other Ambulatory Visit: Payer: Self-pay | Admitting: Podiatry

## 2019-03-23 DIAGNOSIS — M2042 Other hammer toe(s) (acquired), left foot: Secondary | ICD-10-CM

## 2019-03-23 DIAGNOSIS — M792 Neuralgia and neuritis, unspecified: Secondary | ICD-10-CM

## 2019-03-24 ENCOUNTER — Ambulatory Visit (INDEPENDENT_AMBULATORY_CARE_PROVIDER_SITE_OTHER): Payer: Medicare Other | Admitting: Psychology

## 2019-03-24 DIAGNOSIS — F314 Bipolar disorder, current episode depressed, severe, without psychotic features: Secondary | ICD-10-CM

## 2019-03-25 ENCOUNTER — Other Ambulatory Visit: Payer: Self-pay

## 2019-03-25 ENCOUNTER — Ambulatory Visit (INDEPENDENT_AMBULATORY_CARE_PROVIDER_SITE_OTHER): Payer: Medicare Other | Admitting: Adult Health

## 2019-03-25 ENCOUNTER — Encounter: Payer: Self-pay | Admitting: Adult Health

## 2019-03-25 DIAGNOSIS — G4700 Insomnia, unspecified: Secondary | ICD-10-CM

## 2019-03-25 DIAGNOSIS — F411 Generalized anxiety disorder: Secondary | ICD-10-CM

## 2019-03-25 DIAGNOSIS — F319 Bipolar disorder, unspecified: Secondary | ICD-10-CM | POA: Diagnosis not present

## 2019-03-25 DIAGNOSIS — F4310 Post-traumatic stress disorder, unspecified: Secondary | ICD-10-CM | POA: Diagnosis not present

## 2019-03-25 NOTE — Progress Notes (Signed)
Tiffany [name] [id number] [birth year] 69 y.o.  Subjective:   Patient ID:  Tiffany [name] is a 69 y.o. (DOB February 09, 1951) female.  Chief Complaint: No chief complaint on file.   HPI   Tiffany [name] presents to the office today for follow-up of PTSD, insomnia,GAD, BPD 1.  Accompanied by husband  Describes mood today as "ok". Pleasant. Tearful throughout interview. Mood symptoms - feels depressed and anxious - "more so lately".Felt better with the Latuda in the first few weeks. Then after that - "not so much". Feels like her tremors of hand and feet are worse. Having "severe memory issues". Forgetting how to use my phone and the computer. Forgot who prescribed the Latuda. Severe tremors, can't write, difficulty walking. Husbands notes less depression, though occasional crying, but can't remember why. Having sadness and crying - feels like Latuda is causing side effects. Denies any suicidal thinking. Husband notes tremor and forgetfulness improved and then worsened. Talked with therapist yesterday. Spent Christmas with family, although she doesn't remember much about it. Decreased interest and motivation - not physically able to do anything. Taking medications as prescribed.  Energy levels low. Active, does not have a regular exercise routine. Disabled Enjoys some usual interests and activities. Married. Lives with husband. Talking to sister and son. Not getting out as much.  Appetite adequate. Weight stable. Upcoming GI visit.  Sleeps better some nights than others. Averages 10 to 12 hours.  Focus and concentration difficulties. Worsened with addition of Latuda. Completing tasks. Managing aspects of household.  Denies SI or HI. Denies AH or VH.   Previous medications: Celexa, Zyprexa, Tegretol, Depakote, Serzone, Topamax, Seroquel, Effexor, Lexapro, Desipramine, Neurontin, Abilify, Geodon, Propanolol, Cymbalta, Cogentin, Trihexyphenadyl, Simet, Provigil, Selegiline, Requip, Amantadine, Prozac, Mirapex,  Azilect, Metoclopramide, Baclofen, Artane, Namenda, Latuda.   Review of Systems:  Review of Systems  Musculoskeletal: Negative for gait problem.  Neurological: Negative for tremors.  Psychiatric/Behavioral:       Please refer to HPI    Medications: I have reviewed the patient's current medications.  Current Outpatient Medications  Medication Sig Dispense Refill  . acetaminophen (TYLENOL) 500 MG tablet Take 0.5 tablets by mouth as needed for pain.    . Azelaic Acid 15 % cream Apply topically daily.    . calcitRIOL (ROCALTROL) 0.25 MCG capsule Take 1 capsule by mouth 3 (three) times a week.    . Certolizumab Pegol 2 X 200 MG KIT Inject 2 each into the skin every 30 (thirty) days.    . Cholecalciferol 50 MCG (2000 UT) TABS Take 1-2 tablets by mouth daily.    . clonazePAM (KLONOPIN) 0.5 MG tablet Take 1 to 2 tablets at bedtime. 60 tablet 2  . cyanocobalamin 100 MCG tablet Take 1 tablet by mouth daily.    Marland Kitchen doxycycline (VIBRA-TABS) 100 MG tablet Take 1 tablet (100 mg total) by mouth 2 (two) times daily. 14 tablet 0  . HYDROcodone-acetaminophen (NORCO/VICODIN) 5-325 MG tablet Take 1 tablet by mouth every 6 (six) hours as needed for moderate pain. 30 tablet 0  . INGREZZA 40 MG CAPS Take 1 capsule by mouth at bedtime. 30 capsule 2  . lamoTRIgine (LAMICTAL) 100 MG tablet Take three tablets daily. 270 tablet 1  . levothyroxine (SYNTHROID) 125 MCG tablet Take 1 tablet by mouth daily.    Marland Kitchen lidocaine 4 % dressing Apply topically.    Marland Kitchen lithium carbonate (LITHOBID) 300 MG CR tablet Take 1 tablet by mouth daily.    Marland Kitchen LORazepam (ATIVAN) 0.5 MG tablet Take 1 tablet (  0.5 mg total) by mouth daily as needed. 30 tablet 2  . lurasidone (LATUDA) 20 MG TABS tablet Take 1 (20 mg) tablet by mouth every pm with at least 350 calories 30 tablet 0  . Lurasidone HCl (LATUDA PO) Take 1 tablet by mouth.    . oxyCODONE-acetaminophen (PERCOCET) 5-325 MG tablet Take 1 tablet by mouth every 6 (six) hours as needed for  severe pain. 30 tablet 0  . PEG-KCl-NaCl-NaSulf-Na Asc-C (PLENVU) 140 g SOLR Take 1 kit by mouth as directed. 1 each 0  . sertraline (ZOLOFT) 25 MG tablet Take 2 tablets (50 mg total) by mouth daily. 180 tablet 1   No current facility-administered medications for this visit.    Medication Side Effects: None  Allergies:  Allergies  Allergen Reactions  . Aripiprazole     Parkinsonism Parkinsonism   . Methotrexate     Hair loss, severe stomatitis Hair loss, severe stomatitis   . Amoxicillin-Pot Clavulanate     Other reaction(s): Unknown Other reaction(s): Not Known   . Cefdinir     Other reaction(s): Diarrhea Yeast infection and fever; negative c diff  . Etanercept     Headaches Headaches   . Exemestane     Suicidal thoughts with medication Suicidal thoughts with medication   . Fluoxetine   . Methylprednisolone Sodium Succ     Other reaction(s): Other (Specify with Comments) Agitated mania  . Epinephrine Palpitations    Other reaction(s): Tachycardia Tachycardia Tachycardia Tachycardia Tachycardia   . Nitrofurantoin Nausea And Vomiting and Rash    Other reaction(s): Rash, non-urticarial Allergy     Past Medical History:  Diagnosis Date  . Bipolar 1 disorder ([place])   . Depression 1987  . Parkinson's disease ([place]) 2012  . Psoriatic arthritis ([place])   . PTSD (post-traumatic stress disorder)     Family History  Problem Relation Age of Onset  . Cancer Mother   . Cancer Sister   . Stroke Maternal Grandfather   . Diabetes Paternal Grandfather   . Cancer Sister     Social History   Socioeconomic History  . Marital status: Married    Spouse name: Not on file  . Number of children: Not on file  . Years of education: Not on file  . Highest education level: Not on file  Occupational History  . Not on file  Tobacco Use  . Smoking status: Never Smoker  . Smokeless tobacco: Never Used  Substance and Sexual Activity  . Alcohol use: Never  . Drug use:  Never  . Sexual activity: Not Currently  Other Topics Concern  . Not on file  Social History Narrative   Moved to area from [place] 08/2018   Social Determinants of Health   Financial Resource Strain:   . Difficulty of Paying Living Expenses: Not on file  Food Insecurity:   . Worried About Charity fundraiser in the Last Year: Not on file  . Ran Out of Food in the Last Year: Not on file  Transportation Needs:   . Lack of Transportation (Medical): Not on file  . Lack of Transportation (Non-Medical): Not on file  Physical Activity:   . Days of Exercise per Week: Not on file  . Minutes of Exercise per Session: Not on file  Stress:   . Feeling of Stress : Not on file  Social Connections:   . Frequency of Communication with Friends and Family: Not on file  . Frequency of Social Gatherings with Friends and Family: Not  on file  . Attends Religious Services: Not on file  . Active Member of Clubs or Organizations: Not on file  . Attends Archivist Meetings: Not on file  . Marital Status: Not on file  Intimate Partner Violence:   . Fear of Current or Ex-Partner: Not on file  . Emotionally Abused: Not on file  . Physically Abused: Not on file  . Sexually Abused: Not on file    Past Medical History, Surgical history, Social history, and Family history were reviewed and updated as appropriate.   Please see review of systems for further details on the patient's review from today.   Objective:   Physical Exam:  There were no vitals taken for this visit.  Physical Exam Constitutional:      General: She is not in acute distress.    Appearance: She is well-developed.  Musculoskeletal:        General: No deformity.  Neurological:     Mental Status: She is alert and oriented to person, place, and time.     Coordination: Coordination normal.  Psychiatric:        Attention and Perception: Attention and perception normal. She does not perceive auditory or visual  hallucinations.        Mood and Affect: Mood normal. Mood is not anxious or depressed. Affect is not labile, blunt, angry or inappropriate.        Speech: Speech normal.        Behavior: Behavior normal.        Thought Content: Thought content normal. Thought content is not paranoid or delusional. Thought content does not include homicidal or suicidal ideation. Thought content does not include homicidal or suicidal plan.        Cognition and Memory: Cognition and memory normal.        Judgment: Judgment normal.     Comments: Insight intact     Lab Review:     Component Value Date/Time   NA 143 09/29/2018 0000   K 4.6 09/29/2018 0000   BUN 23 (A) 09/29/2018 0000   CREATININE 2.4 (A) 09/29/2018 0000       Component Value Date/Time   HGB 14.1 09/29/2018 0000    Lithium Lvl  Date Value Ref Range Status  01/07/2019 0.6 0.6 - 1.2 mmol/L Final     No results found for: PHENYTOIN, PHENOBARB, VALPROATE, CBMZ   .res Assessment: Plan:    Plan:  1. Lamictal 354m daily 2. Lithium Carbonate 3015mdaily 3. Zoloft 5028maily for worsening mood symptoms. 4. Clonazepam 1mg66m hs 5. Lorazepam 0.5mg 26mly for anxiety 6. Ingrezza 40mg 46my - cannot take a higher dose 7. Discontinue Latuda 20mg  47msider Genetic testing  Lithium level - 0.6 on 01/07/2019  Therapist - [name]   RTC 4 weeks  Patient advised to contact office with any questions, adverse effects, or acute worsening in signs and symptoms.  Counseled patient regarding potential benefits, risks, and side effects of Lamictal to include potential risk of Stevens-Johnson syndrome. Advised patient to stop taking Lamictal and contact office immediately if rash develops and to seek urgent medical attention if rash is severe and/or spreading quickly.  Discussed potential benefits, risk, and side effects of benzodiazepines to include potential risk of tolerance and dependence, as well as possible drowsiness.  Advised  patient not to drive if experiencing drowsiness and to take lowest possible effective dose to minimize risk of dependence and tolerance.   Diagnoses and all orders for  this visit:  Bipolar I disorder ([place])  Generalized anxiety disorder  PTSD (post-traumatic stress disorder)  Insomnia, unspecified type     Please see After Visit Summary for patient specific instructions.  Future Appointments  Date Time Provider [place]  03/30/2019 12:00 PM Cottle, Bambi G, LCSW [facility] None  04/05/2019  1:15 PM Edrick Kins, DPM [facility]  04/08/2019  2:00 PM Cottle, Bambi G, LCSW [facility] None  04/13/2019  9:50 AM [place], MD [facility]  04/14/2019 11:00 AM Cottle, Lucious [place], LCSW [facility] None  04/22/2019  2:00 PM Cottle, Bambi G, LCSW [facility] None  04/27/2019  1:00 PM Leamon Arnt, MD [facility]    No orders of the defined types were placed in this encounter.   -------------------------------

## 2019-03-25 NOTE — Progress Notes (Signed)
   Subjective:  Patient presents today status post 4th toe arthroplasty left. DOS: 03/08/2019. She reports a fall this morning while getting into the bed that resulted in immediate severe pain between the 1st and 2nd toes. She reports some associated swelling of the foot. She states she took a Vicodin after the incident and states the pain is getting better at this time. There are no worsening factors noted. Patient is here for further evaluation and treatment.   Past Medical History:  Diagnosis Date  . Bipolar 1 disorder ([place])   . Depression 1987  . Parkinson's disease ([place]) 2012  . Psoriatic arthritis ([place])   . PTSD (post-traumatic stress disorder)       Objective/Physical Exam Neurovascular status intact.  Skin incisions appear to be well coapted with sutures and staples intact. No sign of infectious process noted. No dehiscence. No active bleeding noted. Moderate edema noted to the surgical extremity. Pain with palpation noted to the 1st intermetatarsal space of the left foot.   Assessment: 1. 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 DOS: 03/08/2019 2. Neuritis 1st intermetatarsal space left    Plan of Care:  1. Patient was evaluated.  2. Sutures removed.  3. Transition out of post op shoe into good sneakers.  4. Continue taking Vicodin 5/325 mg as needed.  5. Return to clinic in 2 weeks for final follow up visit.   Son works for [employer].    Edrick Kins, DPM [facility]  Dr. Edrick Kins, [address]                                        [place], [place] [address]                Office [phone]  Fax [phone]

## 2019-03-30 ENCOUNTER — Telehealth: Payer: Self-pay | Admitting: Adult Health

## 2019-03-30 ENCOUNTER — Ambulatory Visit (INDEPENDENT_AMBULATORY_CARE_PROVIDER_SITE_OTHER): Payer: Medicare Other | Admitting: Psychology

## 2019-03-30 ENCOUNTER — Other Ambulatory Visit: Payer: Self-pay

## 2019-03-30 DIAGNOSIS — F314 Bipolar disorder, current episode depressed, severe, without psychotic features: Secondary | ICD-10-CM

## 2019-03-30 MED ORDER — INGREZZA 40 MG PO CAPS: 1.0000 | ORAL_CAPSULE | Freq: Every day | ORAL | 2 refills | Status: DC

## 2019-03-30 NOTE — Telephone Encounter (Signed)
[facility] called stating they need to update the info for [employer] and need a new RX for [employer] 40mg  sent to them. Apparently they are trying to get a PA but have her old doctor in [place]. On file. Please call [facility] [phone]

## 2019-03-31 NOTE — Telephone Encounter (Signed)
Rx for Ingrezza 40 mg submitted again to [facility], previously was submitted in October for 3 month refills. Will start a PA for her medication as well.

## 2019-04-01 NOTE — Telephone Encounter (Signed)
Spoke with [facility] this morning,  they did receive refill on her Ingrezza 40 mg, they said they faxed the key to initiate her PA through cover my meds a few minutes ago to be submitted.

## 2019-04-01 NOTE — Telephone Encounter (Signed)
Prior authorization submitted with [facility] for Ingrezza 40 mg daily  KEY#[id number] Submitted through cover my meds, pending response

## 2019-04-01 NOTE — Telephone Encounter (Signed)
Received approval for Ingrezza 40 mg through [employer] Part D effective through 03/10/2020 PA# [id number]

## 2019-04-03 ENCOUNTER — Emergency Department (HOSPITAL_COMMUNITY)
Admission: EM | Admit: 2019-04-03 | Discharge: 2019-04-03 | Disposition: A | Discharge status: Home / Self Care | Payer: Medicare Other | Attending: Emergency Medicine | Admitting: Emergency Medicine

## 2019-04-03 ENCOUNTER — Other Ambulatory Visit: Payer: Self-pay

## 2019-04-03 ENCOUNTER — Encounter (HOSPITAL_COMMUNITY): Payer: Self-pay

## 2019-04-03 ENCOUNTER — Emergency Department (HOSPITAL_COMMUNITY): Payer: Medicare Other

## 2019-04-03 DIAGNOSIS — D75839 Thrombocytosis, unspecified: Secondary | ICD-10-CM

## 2019-04-03 DIAGNOSIS — R509 Fever, unspecified: Secondary | ICD-10-CM | POA: Diagnosis present

## 2019-04-03 DIAGNOSIS — D473 Essential (hemorrhagic) thrombocythemia: Secondary | ICD-10-CM | POA: Diagnosis not present

## 2019-04-03 DIAGNOSIS — U071 COVID-19: Secondary | ICD-10-CM

## 2019-04-03 DIAGNOSIS — N183 Chronic kidney disease, stage 3 unspecified: Secondary | ICD-10-CM | POA: Insufficient documentation

## 2019-04-03 DIAGNOSIS — E039 Hypothyroidism, unspecified: Secondary | ICD-10-CM | POA: Diagnosis not present

## 2019-04-03 DIAGNOSIS — Z79899 Other long term (current) drug therapy: Secondary | ICD-10-CM | POA: Insufficient documentation

## 2019-04-03 LAB — COMPREHENSIVE METABOLIC PANEL
ALT: 15 U/L (ref 0–44)
AST: 22 U/L (ref 15–41)
Albumin: 4.4 g/dL (ref 3.5–5.0)
Alkaline Phosphatase: 94 U/L (ref 38–126)
Anion gap: 8 (ref 5–15)
BUN: 24 mg/dL — ABNORMAL HIGH (ref 8–23)
CO2: 25 mmol/L (ref 22–32)
Calcium: 9 mg/dL (ref 8.9–10.3)
Chloride: 107 mmol/L (ref 98–111)
Creatinine, Ser: 2.66 mg/dL — ABNORMAL HIGH (ref 0.44–1.00)
GFR calc Af Amer: 20 mL/min — ABNORMAL LOW (ref >60)
GFR calc non Af Amer: 17 mL/min — ABNORMAL LOW (ref >60)
Glucose, Bld: 100 mg/dL — ABNORMAL HIGH (ref 70–99)
Potassium: 4 mmol/L (ref 3.5–5.1)
Sodium: 140 mmol/L (ref 135–145)
Total Bilirubin: 0.8 mg/dL (ref 0.3–1.2)
Total Protein: 7.8 g/dL (ref 6.5–8.1)

## 2019-04-03 LAB — CBC
HCT: 46.5 % — ABNORMAL HIGH (ref 36.0–46.0)
Hemoglobin: 14.6 g/dL (ref 12.0–15.0)
MCH: 30.4 pg (ref 26.0–34.0)
MCHC: 31.4 g/dL (ref 30.0–36.0)
MCV: 96.7 fL (ref 80.0–100.0)
Platelets: 136 10*3/uL — ABNORMAL LOW (ref 150–400)
RBC: 4.81 MIL/uL (ref 3.87–5.11)
RDW: 14.2 % (ref 11.5–15.5)
WBC: 7 10*3/uL (ref 4.0–10.5)
nRBC: 0 % (ref 0.0–0.2)

## 2019-04-03 LAB — POC SARS CORONAVIRUS 2 AG -  ED: SARS Coronavirus 2 Ag: POSITIVE — AB

## 2019-04-03 LAB — LIPASE, BLOOD: Lipase: 41 U/L (ref 11–51)

## 2019-04-03 MED ORDER — SODIUM CHLORIDE 0.9 % IV BOLUS
1000.0000 mL | Freq: Once | INTRAVENOUS | Status: AC
  Administered 2019-04-03: 1000 mL via INTRAVENOUS

## 2019-04-03 MED ORDER — SODIUM CHLORIDE 0.9% FLUSH
3.0000 mL | Freq: Once | INTRAVENOUS | Status: AC
  Administered 2019-04-03: 3 mL via INTRAVENOUS

## 2019-04-03 NOTE — ED Notes (Signed)
Patient's SPO2 remained at 96% while ambulating in room.

## 2019-04-03 NOTE — ED Provider Notes (Signed)
[facility] DEPT Provider Note   CSN: [id number] Arrival date & time: 04/03/19  1458     History Chief Complaint  Patient presents with  . Fever  . Diarrhea    Tiffany [name] is a 69 y.o. female with PMH/o Depression, PTSD, Parkinson's who presents for evaluation of 5 days of fever and diarrhea. She states fever has gone up to 103F and her last fever was this AM. She has not been taking any meds for the fever. She also reports she has been having 2-3 episodes of non-bloody diarrhea a day. She denies any black or tarry stools but states it has been slightly frothy. No associated abdominal pain. She does report she has been more fatigued and tired. She had a cholecystectomy but does not recall any other abdominal surgeries. She denies an recent travel or known COVID 19 exposure. She denies any CP, cough, SOB, nausea/vomiting, urinary complaints. No recent antibiotics.   The history is provided by the patient.       Past Medical History:  Diagnosis Date  . Bipolar 1 disorder ([place])   . Depression 1987  . Parkinson's disease ([place]) 2012  . Psoriatic arthritis ([place])   . PTSD (post-traumatic stress disorder)     Patient Active Problem List   Diagnosis Date Noted  . Thrombocytopenia ([place]) 09/04/2018  . Vitamin D deficiency 09/04/2018  . Psoriatic arthritis ([place]) 09/04/2018  . History of colonic polyps 05/28/2017  . 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 Malignant neoplasm of overlapping sites of left breast in female, estrogen receptor positive ([place]) 03/14/2016  . Chronic kidney disease, stage III (moderate) 01/19/2016  . Tardive dyskinesia 10/18/2015  . HX: breast cancer 12/27/2014  . Osteopenia determined by x-ray 10/31/2014  . Rosacea 05/21/2007  . Bipolar affective disorder, mixed ([place]) 08/16/2004  . Acquired hypothyroidism 04/03/1996    Past Surgical History:  Procedure Laterality Date  . ABDOMINAL  HYSTERECTOMY  1987  . CHOLECYSTECTOMY  1979  . DILATION AND CURETTAGE OF UTERUS  1973  . MASTECTOMY Bilateral 09/19/2014  . TONSILLECTOMY  1970  . URETERAL REIMPLANTION Bilateral 1974     OB History   No obstetric history on file.     Family History  Problem Relation Age of Onset  . Cancer Mother   . Cancer Sister   . Stroke Maternal Grandfather   . Diabetes Paternal Grandfather   . Cancer Sister     Social History   Tobacco Use  . Smoking status: Never Smoker  . Smokeless tobacco: Never Used  Substance Use Topics  . Alcohol use: Never  . Drug use: Never    Home Medications Prior to Admission medications   Medication Sig Start Date End Date Taking? Authorizing Provider  acetaminophen (TYLENOL) 500 MG tablet Take 0.5 tablets by mouth as needed for pain.    [provider]  Azelaic Acid 15 % cream Apply topically daily. 04/11/17   [provider]  calcitRIOL (ROCALTROL) 0.25 MCG capsule Take 1 capsule by mouth 3 (three) times a week. 06/18/18   [provider]  Certolizumab Pegol 2 X 200 MG KIT Inject 2 each into the skin every 30 (thirty) days.    [provider]  Cholecalciferol 50 MCG (2000 UT) TABS Take 1-2 tablets by mouth daily.    [provider]  clonazePAM (KLONOPIN) 0.5 MG tablet Take 1 to 2 tablets at bedtime. 01/04/19   Mozingo, Berdie Ogren, NP  cyanocobalamin 100 MCG  tablet Take 1 tablet by mouth daily.    [provider]  doxycycline (VIBRA-TABS) 100 MG tablet Take 1 tablet (100 mg total) by mouth 2 (two) times daily. 03/17/19   Edrick Kins, DPM  HYDROcodone-acetaminophen (NORCO/VICODIN) 5-325 MG tablet Take 1 tablet by mouth every 6 (six) hours as needed for moderate pain. 03/08/19   Edrick Kins, DPM  INGREZZA 40 MG CAPS Take 1 capsule by mouth at bedtime. 03/30/19   Mozingo, Berdie Ogren, NP  lamoTRIgine (LAMICTAL) 100 MG tablet Take three tablets daily. 01/04/19   Mozingo, Berdie Ogren, NP   levothyroxine (SYNTHROID) 125 MCG tablet Take 1 tablet by mouth daily. 08/24/18   [provider]  lidocaine 4 % dressing Apply topically.    [provider]  lithium carbonate (LITHOBID) 300 MG CR tablet Take 1 tablet by mouth daily. 08/21/18   [provider]  LORazepam (ATIVAN) 0.5 MG tablet Take 1 tablet (0.5 mg total) by mouth daily as needed. 01/04/19   Mozingo, Berdie Ogren, NP  lurasidone (LATUDA) 20 MG TABS tablet Take 1 (20 mg) tablet by mouth every pm with at least 350 calories 03/09/19   Mozingo, Berdie Ogren, NP  Lurasidone HCl (LATUDA PO) Take 1 tablet by mouth.    [provider]  oxyCODONE-acetaminophen (PERCOCET) 5-325 MG tablet Take 1 tablet by mouth every 6 (six) hours as needed for severe pain. 03/09/19   Edrick Kins, DPM  PEG-KCl-NaCl-NaSulf-Na Asc-C (PLENVU) 140 g SOLR Take 1 kit by mouth as directed. 10/02/18   Thornton Park, MD  pyridoxine (B-6) 100 MG tablet Take by mouth.    [provider]  sertraline (ZOLOFT) 25 MG tablet Take 2 tablets (50 mg total) by mouth daily. 01/04/19   Mozingo, Berdie Ogren, NP    Allergies    Aripiprazole, Methotrexate, Amoxicillin-pot clavulanate, Cefdinir, Etanercept, Exemestane, Fluoxetine, Methylprednisolone sodium succ, Epinephrine, and Nitrofurantoin  Review of Systems   Review of Systems  Constitutional: Positive for fatigue and fever.  Respiratory: Negative for cough and shortness of breath.   Cardiovascular: Negative for chest pain.  Gastrointestinal: Positive for diarrhea. Negative for abdominal pain, blood in stool, nausea and vomiting.  Genitourinary: Negative for dysuria and hematuria.  Neurological: Negative for headaches.  All other systems reviewed and are negative.   Physical Exam Updated Vital Signs BP 140/75 (BP Location: Left Arm)   Pulse 94   Temp 98 F (36.7 C) (Oral)   Resp 15   Ht 5' 4"  (1.626 m)   Wt 99.8 kg   SpO2 99%   BMI 37.76 kg/m    Physical Exam Vitals and nursing note reviewed.  Constitutional:      Appearance: Normal appearance. She is well-developed.  HENT:     Head: Normocephalic and atraumatic.  Eyes:     General: Lids are normal.     Conjunctiva/sclera: Conjunctivae normal.     Pupils: Pupils are equal, round, and reactive to light.  Cardiovascular:     Rate and Rhythm: Normal rate and regular rhythm.     Pulses: Normal pulses.     Heart sounds: Normal heart sounds. No murmur. No friction rub. No gallop.   Pulmonary:     Effort: Pulmonary effort is normal.     Breath sounds: Normal breath sounds.     Comments: Lungs clear to auscultation bilaterally.  Symmetric chest rise.  No wheezing, rales, rhonchi. Abdominal:     General: Bowel sounds are normal.     Palpations: Abdomen is  soft. Abdomen is not rigid.     Tenderness: There is no abdominal tenderness. There is no right CVA tenderness, left CVA tenderness or guarding.     Comments: Abdomen is soft, non-distended, non-tender. No rigidity, No guarding. No peritoneal signs. No CVA tenderness noted bilaterally.   Musculoskeletal:        General: Normal range of motion.     Cervical back: Full passive range of motion without pain.  Skin:    General: Skin is warm and dry.     Capillary Refill: Capillary refill takes less than 2 seconds.  Neurological:     Mental Status: She is alert and oriented to person, place, and time.     Motor: Tremor present.     Comments: Baseline tremor  Psychiatric:        Speech: Speech normal.     ED Results / Procedures / Treatments   Labs (all labs ordered are listed, but only abnormal results are displayed) Labs Reviewed  COMPREHENSIVE METABOLIC PANEL - Abnormal; Notable for the following components:      Result Value   Glucose, Bld 100 (*)    BUN 24 (*)    Creatinine, Ser 2.66 (*)    GFR calc non Af Amer 17 (*)    GFR calc Af Amer 20 (*)    All other components within normal limits  CBC - Abnormal; Notable for  the following components:   HCT 46.5 (*)    Platelets 136 (*)    All other components within normal limits  POC SARS CORONAVIRUS 2 AG -  ED - Abnormal; Notable for the following components:   SARS Coronavirus 2 Ag POSITIVE (*)    All other components within normal limits  LIPASE, BLOOD    EKG None  Radiology DG Chest Portable 1 View  Result Date: 04/03/2019 CLINICAL DATA:  Altered mental status.  Fever. EXAM: PORTABLE CHEST 1 VIEW COMPARISON:  None. FINDINGS: The lungs are clear. Heart size is normal. No pneumothorax or pleural fluid. No acute or focal bony abnormality. IMPRESSION: No acute disease. Electronically Signed   By: Inge Rise M.D.   On: 04/03/2019 15:55    Procedures Procedures (including critical care time)  Medications Ordered in ED Medications  sodium chloride flush (NS) 0.9 % injection 3 mL (3 mLs Intravenous Given 04/03/19 1601)  sodium chloride 0.9 % bolus 1,000 mL (0 mLs Intravenous Stopped 04/03/19 1741)    ED Course  I have reviewed the triage vital signs and the nursing notes.  Pertinent labs & imaging results that were available during my care of the patient were reviewed by me and considered in my medical decision making (see chart for details).    MDM Rules/Calculators/A&P                      69 y.o. F who presents with 5 days of fever, diarrhea as well as some fatigue.  She reports fever has been going up to 103.  No blood noted in stools.  No abdominal pain, nausea/vomiting.  On initial ED arrival, she is afebrile, nontoxic-appearing.  Vital signs are stable.  No abdominal tenderness.  Concern for infectious process versus viral GI process.  Also consider over 19.  Plan check labs, chest x-ray.  CXR negative for any infectious etiology. COVID is positive. CBC shows no leukocytosis or anemia. Platelets are 136.  CMP shows BUN of 24, creatinine of 2.66.  Patient does have history of CKD and  has had elevations in her creatinine before.  Looks like  about a year and a half ago, was between 1.7-1.9.  She had blood work done in July 2020 that showed a creatinine of 2.4.  Patient given fluids here in the ED.  I discussed results with patient.  She again denies any abdominal pain.  At this time, given lack of abdominal pain, no bloody stools, my suspicion for diverticulosis colitis is low.  Additionally, her abdominal exam is benign.  Do not feel that she needs CT scan at this time as she do not suspect surgical abdomen.  I suspect this is all related to Covid.  With patient's permission, I updated her husband.  Patient was able to ambulate in the ED with maintaining O2 sats of 96% on room.  RN inform me that patient was very anxious and was wanting to leave.  I discussed with patient and she was tearful at the time.  Patient states that she feels okay and wants to go home.  She is very nervous and anxious about staying here in the emergency department any longer.  I discussed supportive care measures at home. Discussed patient with Dr. Tamera Punt who is agreeable to plan. At this time, patient exhibits no emergent life-threatening condition that require further evaluation in ED or admission. Patient had ample opportunity for questions and discussion. All patient's questions were answered with full understanding. Strict return precautions discussed. Patient expresses understanding and agreement to plan.   T[place]qua Klimas was evaluated in Emergency Department on 04/03/2019 for the symptoms described in the history of present illness. She was evaluated in the context of the global COVID-19 pandemic, which necessitated consideration that the patient might be at risk for infection with the SARS-CoV-2 virus that causes COVID-19. Institutional protocols and algorithms that pertain to the evaluation of patients at risk for COVID-19 are in a state of rapid change based on information released by regulatory bodies including the CDC and federal and state organizations. These  policies and algorithms were followed during the patient's care in the ED.  Portions of this note were generated with [employer]. Dictation errors may occur despite best attempts at proofreading.   Final Clinical Impression(s) / ED Diagnoses Final diagnoses:  COVID-19  Thrombocythemia [facility])    Rx / DC Orders ED Discharge Orders    None       Desma Mcgregor 04/03/19 2355    Malvin Johns, MD 04/04/19 [phone]

## 2019-04-03 NOTE — ED Notes (Signed)
Patient was unable to sign for discharge due to having "panic attack. Patient verbalized understanding for discharge instructions. Patient stable upon discharge.

## 2019-04-03 NOTE — Discharge Instructions (Signed)
As we discussed, your Covid test did come back positive.  This is likely the cause of her symptoms.  You will need to quarantine for 2 weeks.  Make sure you are staying hydrated drinking plenty of fluids.  You can take Tylenol for fevers. You can take 1000 mg of Tylenol.  Do not exceed 4000 mg of Tylenol a day.  Your blood work today showed that your platelets were slightly low.  This just needs to be rechecked by a primary care doctor in a few weeks.  Closely monitor your symptoms and return for any difficulty breathing, chest pain or any other worsening or concerning symptoms.

## 2019-04-03 NOTE — ED Triage Notes (Signed)
Patient c/o fever and diarrhea x 5 days.  Patient denies any N/V or abdominal pain

## 2019-04-04 ENCOUNTER — Telehealth: Payer: Self-pay | Admitting: Unknown Physician Specialty

## 2019-04-04 ENCOUNTER — Other Ambulatory Visit: Payer: Self-pay | Admitting: Unknown Physician Specialty

## 2019-04-04 DIAGNOSIS — U071 COVID-19: Secondary | ICD-10-CM

## 2019-04-04 NOTE — Telephone Encounter (Signed)
Discussed with patient's husband about Covid symptoms and the use of bamlanivimab, a monoclonal antibody infusion for those with mild to moderate Covid symptoms and at a high risk of hospitalization.  Pt is qualified for this infusion at the [facility] infusion center due to Age > 65   Discussed benefits and risks of treatment and side effects.  She is asleep but he knows she has been studying the treatment.   Sx onset: sx onset 1/19 fever.  Breathing well

## 2019-04-04 NOTE — Progress Notes (Signed)
  I connected by phone with Tiffany [name] husband on 04/04/2019 at 3:45 PM to discuss the potential use of an new treatment for mild to moderate COVID-19 viral infection in non-hospitalized patients.  This patient is a 69 y.o. female that meets the FDA criteria for Emergency Use Authorization of bamlanivimab or casirivimab\imdevimab.  Has a (+) direct SARS-CoV-2 viral test result  Has mild or moderate COVID-19   Is ? 69 years of age and weighs ? 40 kg  Is NOT hospitalized due to COVID-19  Is NOT requiring oxygen therapy or requiring an increase in baseline oxygen flow rate due to COVID-19  Is within 10 days of symptom onset  Has at least one of the high risk factor(s) for progression to severe COVID-19 and/or hospitalization as defined in EUA.  Specific high risk criteria : >/= 71 yo   I have spoken and communicated the following to the patient or parent/caregiver:  1. FDA has authorized the emergency use of bamlanivimab and casirivimab\imdevimab for the treatment of mild to moderate COVID-19 in adults and pediatric patients with positive results of direct SARS-CoV-2 viral testing who are 31 years of age and older weighing at least 40 kg, and who are at high risk for progressing to severe COVID-19 and/or hospitalization.  2. The significant known and potential risks and benefits of bamlanivimab and casirivimab\imdevimab, and the extent to which such potential risks and benefits are unknown.  3. Information on available alternative treatments and the risks and benefits of those alternatives, including clinical trials.  4. Patients treated with bamlanivimab and casirivimab\imdevimab should continue to self-isolate and use infection control measures (e.g., wear mask, isolate, social distance, avoid sharing personal items, clean and disinfect "high touch" surfaces, and frequent handwashing) according to CDC guidelines.   5. The patient or parent/caregiver has the option to accept or refuse  bamlanivimab or casirivimab\imdevimab .  After reviewing this information with the patient, The patient agreed to proceed with receiving the bamlanimivab infusion and will be provided a copy of the Fact sheet prior to receiving the infusion.[name] 04/04/2019 3:45 PM

## 2019-04-05 ENCOUNTER — Encounter: Payer: Medicare Other | Admitting: Podiatry

## 2019-04-06 ENCOUNTER — Encounter (HOSPITAL_COMMUNITY): Payer: Self-pay | Admitting: Emergency Medicine

## 2019-04-06 ENCOUNTER — Emergency Department (HOSPITAL_COMMUNITY): Payer: Medicare Other

## 2019-04-06 ENCOUNTER — Other Ambulatory Visit: Payer: Self-pay

## 2019-04-06 ENCOUNTER — Emergency Department (HOSPITAL_COMMUNITY)
Admission: EM | Admit: 2019-04-06 | Discharge: 2019-04-06 | Disposition: A | Discharge status: Home / Self Care | Payer: Medicare Other | Attending: Emergency Medicine | Admitting: Emergency Medicine

## 2019-04-06 DIAGNOSIS — Z79899 Other long term (current) drug therapy: Secondary | ICD-10-CM | POA: Insufficient documentation

## 2019-04-06 DIAGNOSIS — Z853 Personal history of malignant neoplasm of breast: Secondary | ICD-10-CM | POA: Diagnosis not present

## 2019-04-06 DIAGNOSIS — G20 Parkinson's disease: Secondary | ICD-10-CM | POA: Insufficient documentation

## 2019-04-06 DIAGNOSIS — U071 COVID-19: Secondary | ICD-10-CM | POA: Diagnosis not present

## 2019-04-06 DIAGNOSIS — R8290 Unspecified abnormal findings in urine: Secondary | ICD-10-CM | POA: Insufficient documentation

## 2019-04-06 DIAGNOSIS — E039 Hypothyroidism, unspecified: Secondary | ICD-10-CM | POA: Diagnosis not present

## 2019-04-06 DIAGNOSIS — N183 Chronic kidney disease, stage 3 unspecified: Secondary | ICD-10-CM | POA: Diagnosis not present

## 2019-04-06 LAB — BASIC METABOLIC PANEL
Anion gap: 8 (ref 5–15)
BUN: 13 mg/dL (ref 8–23)
CO2: 22 mmol/L (ref 22–32)
Calcium: 9.3 mg/dL (ref 8.9–10.3)
Chloride: 112 mmol/L — ABNORMAL HIGH (ref 98–111)
Creatinine, Ser: 2.18 mg/dL — ABNORMAL HIGH (ref 0.44–1.00)
GFR calc Af Amer: 26 mL/min — ABNORMAL LOW (ref >60)
GFR calc non Af Amer: 22 mL/min — ABNORMAL LOW (ref >60)
Glucose, Bld: 107 mg/dL — ABNORMAL HIGH (ref 70–99)
Potassium: 4.5 mmol/L (ref 3.5–5.1)
Sodium: 142 mmol/L (ref 135–145)

## 2019-04-06 LAB — URINALYSIS, ROUTINE W REFLEX MICROSCOPIC
Bilirubin Urine: NEGATIVE
Glucose, UA: NEGATIVE mg/dL
Hgb urine dipstick: NEGATIVE
Ketones, ur: NEGATIVE mg/dL
Nitrite: NEGATIVE
Protein, ur: 30 mg/dL — AB
Specific Gravity, Urine: 1.01 (ref 1.005–1.030)
pH: 6 (ref 5.0–8.0)

## 2019-04-06 LAB — PROCALCITONIN: Procalcitonin: <0.1 ng/mL

## 2019-04-06 LAB — CBC
HCT: 47.3 % — ABNORMAL HIGH (ref 36.0–46.0)
Hemoglobin: 14.7 g/dL (ref 12.0–15.0)
MCH: 29.6 pg (ref 26.0–34.0)
MCHC: 31.1 g/dL (ref 30.0–36.0)
MCV: 95.4 fL (ref 80.0–100.0)
Platelets: 180 10*3/uL (ref 150–400)
RBC: 4.96 MIL/uL (ref 3.87–5.11)
RDW: 13.8 % (ref 11.5–15.5)
WBC: 7.8 10*3/uL (ref 4.0–10.5)
nRBC: 0 % (ref 0.0–0.2)

## 2019-04-06 LAB — LACTATE DEHYDROGENASE: LDH: 159 U/L (ref 98–192)

## 2019-04-06 LAB — HEPATIC FUNCTION PANEL
ALT: 14 U/L (ref 0–44)
AST: 18 U/L (ref 15–41)
Albumin: 3.8 g/dL (ref 3.5–5.0)
Alkaline Phosphatase: 91 U/L (ref 38–126)
Bilirubin, Direct: 0.2 mg/dL (ref 0.0–0.2)
Indirect Bilirubin: 0.7 mg/dL (ref 0.3–0.9)
Total Bilirubin: 0.9 mg/dL (ref 0.3–1.2)
Total Protein: 6.9 g/dL (ref 6.5–8.1)

## 2019-04-06 LAB — LACTIC ACID, PLASMA: Lactic Acid, Venous: 1 mmol/L (ref 0.5–1.9)

## 2019-04-06 LAB — TRIGLYCERIDES: Triglycerides: 117 mg/dL (ref <150)

## 2019-04-06 LAB — FERRITIN: Ferritin: 324 ng/mL — ABNORMAL HIGH (ref 11–307)

## 2019-04-06 LAB — C-REACTIVE PROTEIN: CRP: 0.6 mg/dL (ref <1.0)

## 2019-04-06 LAB — FIBRINOGEN: Fibrinogen: 481 mg/dL — ABNORMAL HIGH (ref 210–475)

## 2019-04-06 LAB — D-DIMER, QUANTITATIVE: D-Dimer, Quant: 0.98 ug/mL-FEU — ABNORMAL HIGH (ref 0.00–0.50)

## 2019-04-06 NOTE — ED Notes (Signed)
PT verbalized understanding of discharge instructions. Follow up care and pain management, pt had no further questions.

## 2019-04-06 NOTE — ED Notes (Signed)
Pt ambulatory in room; sats stayed at 94% RA; pt denies increased sob with ambulation

## 2019-04-06 NOTE — ED Notes (Signed)
Urine culture sent down with urinalysis sample.

## 2019-04-06 NOTE — ED Triage Notes (Signed)
Pt endorses SOB since last night. Covid + and continues to have diarrhea. Endorses weakness and dehydration.

## 2019-04-06 NOTE — ED Provider Notes (Signed)
[facility] [facility] EMERGENCY DEPARTMENT Provider Note   CSN: [id number] Arrival date & time: 04/06/19  1159     History Chief Complaint  Patient presents with  . Shortness of Breath    Tiffany [name] is a 69 y.o. female.  The history is provided by the patient and medical records. No language interpreter was used.  Shortness of Breath  Tiffany [name] is a 69 y.o. female who presents to the Emergency Department complaining of shortness of breath. She presents the emergency department from home complaining of shortness of breath and COVID-19 infection. She was diagnosed with COVID-19 on January 23. She states that she began feeling poorly about eight days ago with cough, body aches, nausea, diarrhea. Yesterday she reports increased shortness of breath but is able to ambulate the home without difficulty. She presents for evaluation due to progressive shortness of breath. She does have a cough that is not productive.   She has experienced fevers to 104 at home. She lives at home with her husband and her daughter. Symptoms are moderate, constant, worsening.    Past Medical History:  Diagnosis Date  . Bipolar 1 disorder ([name])   . Depression 1987  . Parkinson's disease ([place]) 2012  . Psoriatic arthritis ([place])   . PTSD (post-traumatic stress disorder)     Patient Active Problem List   Diagnosis Date Noted  . Thrombocytopenia ([place]) 09/04/2018  . Vitamin D deficiency 09/04/2018  . Psoriatic arthritis ([place]) 09/04/2018  . History of colonic polyps 05/28/2017  . 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 Malignant neoplasm of overlapping sites of left breast in female, estrogen receptor positive ([place]) 03/14/2016  . Chronic kidney disease, stage III (moderate) 01/19/2016  . Tardive dyskinesia 10/18/2015  . HX: breast cancer 12/27/2014  . Osteopenia determined by x-ray 10/31/2014  . Rosacea 05/21/2007  . Bipolar affective disorder, mixed ([place])  08/16/2004  . Acquired hypothyroidism 04/03/1996    Past Surgical History:  Procedure Laterality Date  . ABDOMINAL HYSTERECTOMY  1987  . CHOLECYSTECTOMY  1979  . DILATION AND CURETTAGE OF UTERUS  1973  . MASTECTOMY Bilateral 09/19/2014  . TONSILLECTOMY  1970  . URETERAL REIMPLANTION Bilateral 1974     OB History   No obstetric history on file.     Family History  Problem Relation Age of Onset  . Cancer Mother   . Cancer Sister   . Stroke Maternal Grandfather   . Diabetes Paternal Grandfather   . Cancer Sister     Social History   Tobacco Use  . Smoking status: Never Smoker  . Smokeless tobacco: Never Used  Substance Use Topics  . Alcohol use: Never  . Drug use: Never    Home Medications Prior to Admission medications   Medication Sig Start Date End Date Taking? Authorizing Provider  acetaminophen (TYLENOL) 500 MG tablet Take 0.5 tablets by mouth as needed for pain.    [provider]  Azelaic Acid 15 % cream Apply topically daily. 04/11/17   [provider]  calcitRIOL (ROCALTROL) 0.25 MCG capsule Take 1 capsule by mouth 3 (three) times a week. 06/18/18   [provider]  Certolizumab Pegol 2 X 200 MG KIT Inject 2 each into the skin every 30 (thirty) days.    [provider]  Cholecalciferol 50 MCG (2000 UT) TABS Take 1-2 tablets by mouth daily.    [provider]  clonazePAM (KLONOPIN) 0.5 MG tablet Take 1 to 2 tablets at bedtime. 01/04/19  Mozingo, Berdie Ogren, NP  cyanocobalamin 100 MCG tablet Take 1 tablet by mouth daily.    [provider]  doxycycline (VIBRA-TABS) 100 MG tablet Take 1 tablet (100 mg total) by mouth 2 (two) times daily. 03/17/19   Edrick Kins, DPM  HYDROcodone-acetaminophen (NORCO/VICODIN) 5-325 MG tablet Take 1 tablet by mouth every 6 (six) hours as needed for moderate pain. 03/08/19   Edrick Kins, DPM  INGREZZA 40 MG CAPS Take 1 capsule by mouth at bedtime. 03/30/19   Mozingo, Berdie Ogren, NP  lamoTRIgine (LAMICTAL) 100 MG tablet Take three tablets daily. 01/04/19   Mozingo, Berdie Ogren, NP  levothyroxine (SYNTHROID) 125 MCG tablet Take 1 tablet by mouth daily. 08/24/18   [provider]  lidocaine 4 % dressing Apply topically.    [provider]  lithium carbonate (LITHOBID) 300 MG CR tablet Take 1 tablet by mouth daily. 08/21/18   [provider]  LORazepam (ATIVAN) 0.5 MG tablet Take 1 tablet (0.5 mg total) by mouth daily as needed. 01/04/19   Mozingo, Berdie Ogren, NP  lurasidone (LATUDA) 20 MG TABS tablet Take 1 (20 mg) tablet by mouth every pm with at least 350 calories 03/09/19   Mozingo, Berdie Ogren, NP  Lurasidone HCl (LATUDA PO) Take 1 tablet by mouth.    [provider]  oxyCODONE-acetaminophen (PERCOCET) 5-325 MG tablet Take 1 tablet by mouth every 6 (six) hours as needed for severe pain. 03/09/19   Edrick Kins, DPM  PEG-KCl-NaCl-NaSulf-Na Asc-C (PLENVU) 140 g SOLR Take 1 kit by mouth as directed. 10/02/18   Thornton Park, MD  pyridoxine (B-6) 100 MG tablet Take by mouth.    [provider]  sertraline (ZOLOFT) 25 MG tablet Take 2 tablets (50 mg total) by mouth daily. 01/04/19   Mozingo, Berdie Ogren, NP    Allergies    Aripiprazole, Methotrexate, Amoxicillin-pot clavulanate, Cefdinir, Etanercept, Exemestane, Fluoxetine, Methylprednisolone sodium succ, Epinephrine, and Nitrofurantoin  Review of Systems   Review of Systems  Respiratory: Positive for shortness of breath.   All other systems reviewed and are negative.   Physical Exam Updated Vital Signs BP 137/67   Pulse 67   Temp 99.1 F (37.3 C) (Oral)   Resp 16   Ht _0  (1.626 m)   Wt 99.8 kg   SpO2 96%   BMI 37.76 kg/m   Physical Exam Vitals and nursing note reviewed.  Constitutional:      Appearance: She is well-developed.  HENT:     Head: Normocephalic and atraumatic.  Cardiovascular:     Rate and Rhythm: Normal rate and  regular rhythm.     Heart sounds: No murmur.  Pulmonary:     Effort: Pulmonary effort is normal. No respiratory distress.     Breath sounds: Normal breath sounds.  Abdominal:     Palpations: Abdomen is soft.     Tenderness: There is no abdominal tenderness. There is no guarding or rebound.  Musculoskeletal:        General: No swelling or tenderness.  Skin:    General: Skin is warm and dry.  Neurological:     Mental Status: She is alert and oriented to person, place, and time.     Comments: Resting tremor to all four extremities  Psychiatric:        Behavior: Behavior normal.     ED Results / Procedures / Treatments   Labs (all labs ordered are listed, but only abnormal results are displayed) Labs Reviewed  BASIC METABOLIC PANEL - Abnormal; Notable for the following components:      Result Value   Chloride 112 (*)    Glucose, Bld 107 (*)    Creatinine, Ser 2.18 (*)    GFR calc non Af Amer 22 (*)    GFR calc Af Amer 26 (*)    All other components within normal limits  CBC - Abnormal; Notable for the following components:   HCT 47.3 (*)    All other components within normal limits  URINALYSIS, ROUTINE W REFLEX MICROSCOPIC - Abnormal; Notable for the following components:   Protein, ur 30 (*)    Leukocytes,Ua TRACE (*)    Bacteria, UA RARE (*)    All other components within normal limits  D-DIMER, QUANTITATIVE (NOT AT [facility]) - Abnormal; Notable for the following components:   D-Dimer, Quant 0.98 (*)    All other components within normal limits  FERRITIN - Abnormal; Notable for the following components:   Ferritin 324 (*)    All other components within normal limits  FIBRINOGEN - Abnormal; Notable for the following components:   Fibrinogen 481 (*)    All other components within normal limits  PROCALCITONIN  LACTATE DEHYDROGENASE  C-REACTIVE PROTEIN  LACTIC ACID, PLASMA  HEPATIC FUNCTION PANEL  TRIGLYCERIDES    EKG EKG Interpretation  Date/Time:  Tuesday April 06 2019 12:10:17 EST Ventricular Rate:  97 PR Interval:  146 QRS Duration: 80 QT Interval:  362 QTC Calculation: 459 R Axis:   21 Text Interpretation: interpretation is limited secondary to artifact Nonspecific ST and T wave abnormality Abnormal ECG no prior available for comparison Confirmed by Quintella Reichert [id number]) on 04/06/2019 1:11:33 PM   Radiology DG Chest Port 1 View  Result Date: 04/06/2019 CLINICAL DATA:  Shortness of breath. COVID positive. EXAM: PORTABLE CHEST 1 VIEW COMPARISON:  04/03/2019 FINDINGS: The cardiac silhouette, mediastinal and hilar contours are within normal limits and stable. Mild tortuosity of the thoracic aorta. No infiltrates or effusions. No pulmonary lesions. The bony thorax is intact. IMPRESSION: No acute cardiopulmonary findings. Electronically Signed   By: Marijo Sanes M.D.   On: 04/06/2019 13:07    Procedures Procedures (including critical care time)  Medications Ordered in ED Medications - No data to display  ED Course  I have reviewed the triage vital signs and the nursing notes.  Pertinent labs & imaging results that were available during my care of the patient were reviewed by me and considered in my medical decision making (see chart for details).    MDM Rules/Calculators/A&P                     Patient with recent diagnosis of COVID-19 infection here for evaluation of shortness of breath. She is anxious but non-toxic appearing on evaluation with no respiratory distress. Lungs are clear bilaterally. Chest x-ray without acute infiltrate. Labs with stable renal insufficiency. D dimer is minimally elevated, this is a common finding in the setting of COVID-19 infection it is not consistent with PE. Patient with ambulatory stats of 94%. She did have one documented pulse oximetry of 92% but the majority of her oxygen saturations are in the mid to upper 90s. During her ED stay she denies any shortness of breath and asks to go home. Discussed this with  patient and husband, feel that she can safely be discharged home but she will need to return if she has any progressive symptoms. Discussed outpatient follow-up and return precautions.  Final Clinical  Impression(s) / ED Diagnoses Final diagnoses:  [id number]    Rx / DC Orders ED Discharge Orders    None       Quintella Reichert, MD 04/06/19 1725

## 2019-04-08 ENCOUNTER — Ambulatory Visit: Payer: Medicare Other | Admitting: Psychology

## 2019-04-08 ENCOUNTER — Ambulatory Visit (HOSPITAL_COMMUNITY): Payer: Medicare Other

## 2019-04-08 ENCOUNTER — Ambulatory Visit (HOSPITAL_COMMUNITY)
Admission: RE | Admit: 2019-04-08 | Discharge: 2019-04-08 | Disposition: A | Discharge status: Home / Self Care | Payer: Medicare Other | Source: Ambulatory Visit | Attending: Pulmonary Disease | Admitting: Pulmonary Disease

## 2019-04-08 DIAGNOSIS — Z23 Encounter for immunization: Secondary | ICD-10-CM | POA: Insufficient documentation

## 2019-04-08 DIAGNOSIS — U071 COVID-19: Secondary | ICD-10-CM | POA: Diagnosis present

## 2019-04-08 MED ORDER — SODIUM CHLORIDE 0.9 % IV SOLN
700.0000 mg | Freq: Once | INTRAVENOUS | Status: AC
  Administered 2019-04-08: 13:00:00 700 mg via INTRAVENOUS
  Filled 2019-04-08: qty 20

## 2019-04-08 MED ORDER — DIPHENHYDRAMINE HCL 50 MG/ML IJ SOLN: 50.0000 mg | Freq: Once | INTRAMUSCULAR | Status: DC | PRN

## 2019-04-08 MED ORDER — ALBUTEROL SULFATE HFA 108 (90 BASE) MCG/ACT IN AERS: 2.0000 | INHALATION_SPRAY | Freq: Once | RESPIRATORY_TRACT | Status: DC | PRN

## 2019-04-08 MED ORDER — SODIUM CHLORIDE 0.9 % IV SOLN
INTRAVENOUS | Status: DC | PRN
  Administered 2019-04-08: 13:00:00 250 mL via INTRAVENOUS

## 2019-04-08 MED ORDER — FAMOTIDINE IN NACL 20-0.9 MG/50ML-% IV SOLN: 20.0000 mg | Freq: Once | INTRAVENOUS | Status: DC | PRN

## 2019-04-08 MED ORDER — EPINEPHRINE 0.3 MG/0.3ML IJ SOAJ: 0.3000 mg | Freq: Once | INTRAMUSCULAR | Status: DC | PRN

## 2019-04-08 MED ORDER — METHYLPREDNISOLONE SODIUM SUCC 125 MG IJ SOLR: 125.0000 mg | Freq: Once | INTRAMUSCULAR | Status: DC | PRN

## 2019-04-08 NOTE — Progress Notes (Signed)
  Diagnosis: COVID-19  Physician:  Procedure: [facility] Med: bamlanivimab infusion - Provided patient with bamlanimivab fact sheet for patients, parents and caregivers prior to infusion.  Complications: No immediate complications noted.  Discharge: Discharged home   [place], [place] 04/08/2019

## 2019-04-08 NOTE — Discharge Instructions (Signed)
10 Things You Can Do to Manage Your COVID-19 Symptoms at Home If you have possible or confirmed COVID-19: 1. Stay home from work and school. And stay away from other public places. If you must go out, avoid using any kind of public transportation, ridesharing, or taxis. 2. Monitor your symptoms carefully. If your symptoms get worse, call your healthcare provider immediately. 3. Get rest and stay hydrated. 4. If you have a medical appointment, call the healthcare provider ahead of time and tell them that you have or may have COVID-19. 5. For medical emergencies, call 911 and notify the dispatch personnel that you have or may have COVID-19. 6. Cover your cough and sneezes with a tissue or use the inside of your elbow. 7. Wash your hands often with soap and water for at least 20 seconds or clean your hands with an alcohol-based hand sanitizer that contains at least 60% alcohol. 8. As much as possible, stay in a specific room and away from other people in your home. Also, you should use a separate bathroom, if available. If you need to be around other people in or outside of the home, wear a mask. 9. Avoid sharing personal items with other people in your household, like dishes, towels, and bedding. 10. Clean all surfaces that are touched often, like counters, tabletops, and doorknobs. Use household cleaning sprays or wipes according to the label instructions. cdc.gov/coronavirus 09/09/2018 This information is not intended to replace advice given to you by your health care provider. Make sure you discuss any questions you have with your health care provider. Document Revised: 02/11/2019 Document Reviewed: 02/11/2019 Elsevier Patient Education  2020 Elsevier Inc.   COVID-19 COVID-19 is a respiratory infection that is caused by a virus called severe acute respiratory syndrome coronavirus 2 (SARS-CoV-2). The disease is also known as coronavirus disease or novel coronavirus. In some people, the virus may  not cause any symptoms. In others, it may cause a serious infection. The infection can get worse quickly and can lead to complications, such as:  Pneumonia, or infection of the lungs.  Acute respiratory distress syndrome or ARDS. This is a condition in which fluid build-up in the lungs prevents the lungs from filling with air and passing oxygen into the blood.  Acute respiratory failure. This is a condition in which there is not enough oxygen passing from the lungs to the body or when carbon dioxide is not passing from the lungs out of the body.  Sepsis or septic shock. This is a serious bodily reaction to an infection.  Blood clotting problems.  Secondary infections due to bacteria or fungus.  Organ failure. This is when your body's organs stop working. The virus that causes COVID-19 is contagious. This means that it can spread from person to person through droplets from coughs and sneezes (respiratory secretions). What are the causes? This illness is caused by a virus. You may catch the virus by:  Breathing in droplets from an infected person. Droplets can be spread by a person breathing, speaking, singing, coughing, or sneezing.  Touching something, like a table or a doorknob, that was exposed to the virus (contaminated) and then touching your mouth, nose, or eyes. What increases the risk? Risk for infection You are more likely to be infected with this virus if you:  Are within 6 feet (2 meters) of a person with COVID-19.  Provide care for or live with a person who is infected with COVID-19.  Spend time in crowded indoor spaces or   live in shared housing. Risk for serious illness You are more likely to become seriously ill from the virus if you:  Are 50 years of age or older. The higher your age, the more you are at risk for serious illness.  Live in a nursing home or long-term care facility.  Have cancer.  Have a long-term (chronic) disease such as: ? Chronic lung disease,  including chronic obstructive pulmonary disease or asthma. ? A long-term disease that lowers your body's ability to fight infection (immunocompromised). ? Heart disease, including heart failure, a condition in which the arteries that lead to the heart become narrow or blocked (coronary artery disease), a disease which makes the heart muscle thick, weak, or stiff (cardiomyopathy). ? Diabetes. ? Chronic kidney disease. ? Sickle cell disease, a condition in which red blood cells have an abnormal "sickle" shape. ? Liver disease.  Are obese. What are the signs or symptoms? Symptoms of this condition can range from mild to severe. Symptoms may appear any time from 2 to 14 days after being exposed to the virus. They include:  A fever or chills.  A cough.  Difficulty breathing.  Headaches, body aches, or muscle aches.  Runny or stuffy (congested) nose.  A sore throat.  New loss of taste or smell. Some people may also have stomach problems, such as nausea, vomiting, or diarrhea. Other people may not have any symptoms of COVID-19. How is this diagnosed? This condition may be diagnosed based on:  Your signs and symptoms, especially if: ? You live in an area with a COVID-19 outbreak. ? You recently traveled to or from an area where the virus is common. ? You provide care for or live with a person who was diagnosed with COVID-19. ? You were exposed to a person who was diagnosed with COVID-19.  A physical exam.  Lab tests, which may include: ? Taking a sample of fluid from the back of your nose and throat (nasopharyngeal fluid), your nose, or your throat using a swab. ? A sample of mucus from your lungs (sputum). ? Blood tests.  Imaging tests, which may include, X-rays, CT scan, or ultrasound. How is this treated? At present, there is no medicine to treat COVID-19. Medicines that treat other diseases are being used on a trial basis to see if they are effective against COVID-19. Your  health care provider will talk with you about ways to treat your symptoms. For most people, the infection is mild and can be managed at home with rest, fluids, and over-the-counter medicines. Treatment for a serious infection usually takes places in a hospital intensive care unit (ICU). It may include one or more of the following treatments. These treatments are given until your symptoms improve.  Receiving fluids and medicines through an IV.  Supplemental oxygen. Extra oxygen is given through a tube in the nose, a face mask, or a hood.  Positioning you to lie on your stomach (prone position). This makes it easier for oxygen to get into the lungs.  Continuous positive airway pressure (CPAP) or bi-level positive airway pressure (BPAP) machine. This treatment uses mild air pressure to keep the airways open. A tube that is connected to a motor delivers oxygen to the body.  Ventilator. This treatment moves air into and out of the lungs by using a tube that is placed in your windpipe.  Tracheostomy. This is a procedure to create a hole in the neck so that a breathing tube can be inserted.  Extracorporeal membrane   oxygenation (ECMO). This procedure gives the lungs a chance to recover by taking over the functions of the heart and lungs. It supplies oxygen to the body and removes carbon dioxide. Follow these instructions at home: Lifestyle  If you are sick, stay home except to get medical care. Your health care provider will tell you how long to stay home. Call your health care provider before you go for medical care.  Rest at home as told by your health care provider.  Do not use any products that contain nicotine or tobacco, such as cigarettes, e-cigarettes, and chewing tobacco. If you need help quitting, ask your health care provider.  Return to your normal activities as told by your health care provider. Ask your health care provider what activities are safe for you. General  instructions  Take over-the-counter and prescription medicines only as told by your health care provider.  Drink enough fluid to keep your urine pale yellow.  Keep all follow-up visits as told by your health care provider. This is important. How is this prevented?  There is no vaccine to help prevent COVID-19 infection. However, there are steps you can take to protect yourself and others from this virus. To protect yourself:   Do not travel to areas where COVID-19 is a risk. The areas where COVID-19 is reported change often. To identify high-risk areas and travel restrictions, check the CDC travel website: wwwnc.cdc.gov/travel/notices  If you live in, or must travel to, an area where COVID-19 is a risk, take precautions to avoid infection. ? Stay away from people who are sick. ? Wash your hands often with soap and water for 20 seconds. If soap and water are not available, use an alcohol-based hand sanitizer. ? Avoid touching your mouth, face, eyes, or nose. ? Avoid going out in public, follow guidance from your state and local health authorities. ? If you must go out in public, wear a cloth face covering or face mask. Make sure your mask covers your nose and mouth. ? Avoid crowded indoor spaces. Stay at least 6 feet (2 meters) away from others. ? Disinfect objects and surfaces that are frequently touched every day. This may include:  Counters and tables.  Doorknobs and light switches.  Sinks and faucets.  Electronics, such as phones, remote controls, keyboards, computers, and tablets. To protect others: If you have symptoms of COVID-19, take steps to prevent the virus from spreading to others.  If you think you have a COVID-19 infection, contact your health care provider right away. Tell your health care team that you think you may have a COVID-19 infection.  Stay home. Leave your house only to seek medical care. Do not use public transport.  Do not travel while you are  sick.  Wash your hands often with soap and water for 20 seconds. If soap and water are not available, use alcohol-based hand sanitizer.  Stay away from other members of your household. Let healthy household members care for children and pets, if possible. If you have to care for children or pets, wash your hands often and wear a mask. If possible, stay in your own room, separate from others. Use a different bathroom.  Make sure that all people in your household wash their hands well and often.  Cough or sneeze into a tissue or your sleeve or elbow. Do not cough or sneeze into your hand or into the air.  Wear a cloth face covering or face mask. Make sure your mask covers your nose   and mouth. Where to find more information  Centers for Disease Control and Prevention: www.cdc.gov/coronavirus/2019-ncov/index.html  World Health Organization: www.who.int/health-topics/coronavirus Contact a health care provider if:  You live in or have traveled to an area where COVID-19 is a risk and you have symptoms of the infection.  You have had contact with someone who has COVID-19 and you have symptoms of the infection. Get help right away if:  You have trouble breathing.  You have pain or pressure in your chest.  You have confusion.  You have bluish lips and fingernails.  You have difficulty waking from sleep.  You have symptoms that get worse. These symptoms may represent a serious problem that is an emergency. Do not wait to see if the symptoms will go away. Get medical help right away. Call your local emergency services (911 in the [place] Do not drive yourself to the hospital. Let the emergency medical personnel know if you think you have COVID-19. Summary  COVID-19 is a respiratory infection that is caused by a virus. It is also known as coronavirus disease or novel coronavirus. It can cause serious infections, such as pneumonia, acute respiratory distress syndrome, acute respiratory failure,  or sepsis.  The virus that causes COVID-19 is contagious. This means that it can spread from person to person through droplets from breathing, speaking, singing, coughing, or sneezing.  You are more likely to develop a serious illness if you are 50 years of age or older, have a weak immune system, live in a nursing home, or have chronic disease.  There is no medicine to treat COVID-19. Your health care provider will talk with you about ways to treat your symptoms.  Take steps to protect yourself and others from infection. Wash your hands often and disinfect objects and surfaces that are frequently touched every day. Stay away from people who are sick and wear a mask if you are sick. This information is not intended to replace advice given to you by your health care provider. Make sure you discuss any questions you have with your health care provider. Document Revised: 12/25/2018 Document Reviewed: 04/02/2018 Elsevier Patient Education  2020 Elsevier Inc.  What types of side effects do monoclonal antibody drugs cause?  Common side effects  In general, the more common side effects caused by monoclonal antibody drugs include: . Allergic reactions, such as hives or itching . Flu-like signs and symptoms, including chills, fatigue, fever, and muscle aches and pains . Nausea, vomiting . Diarrhea . Skin rashes . Low blood pressure   The CDC is recommending patients who receive monoclonal antibody treatments wait at least 90 days before being vaccinated.  Currently, there are no data on the safety and efficacy of mRNA COVID-19 vaccines in persons who received monoclonal antibodies or convalescent plasma as part of COVID-19 treatment. Based on the estimated half-life of such therapies as well as evidence suggesting that reinfection is uncommon in the 90 days after initial infection, vaccination should be deferred for at least 90 days, as a precautionary measure until additional information becomes  available, to avoid interference of the antibody treatment with vaccine-induced immune responses.  

## 2019-04-09 ENCOUNTER — Telehealth: Payer: Self-pay | Admitting: Emergency Medicine

## 2019-04-09 NOTE — Telephone Encounter (Signed)
Spoke with patient, she has been diagnosed with COVID19 so she rescheduled her appointment for 04-13-19. She would like to schedule her colonoscopy but is afraid due to health problems and wanted to discuss with you. Does she need an office appointment or can she be a direct schedule?

## 2019-04-09 NOTE — Telephone Encounter (Signed)
Noted, patient rescheduled for 05/07/19.

## 2019-04-09 NOTE — Telephone Encounter (Signed)
If she would like to discuss it, she will need an office visit first. Thanks.

## 2019-04-12 ENCOUNTER — Emergency Department (HOSPITAL_COMMUNITY)
Admission: EM | Admit: 2019-04-12 | Discharge: 2019-04-13 | Disposition: A | Discharge status: Home / Self Care | Payer: Medicare Other | Attending: Emergency Medicine | Admitting: Emergency Medicine

## 2019-04-12 ENCOUNTER — Emergency Department (HOSPITAL_COMMUNITY): Payer: Medicare Other

## 2019-04-12 ENCOUNTER — Encounter: Payer: Self-pay | Admitting: Family Medicine

## 2019-04-12 ENCOUNTER — Ambulatory Visit (INDEPENDENT_AMBULATORY_CARE_PROVIDER_SITE_OTHER): Payer: Medicare Other | Admitting: Family Medicine

## 2019-04-12 ENCOUNTER — Other Ambulatory Visit: Payer: Self-pay

## 2019-04-12 VITALS — BP 163/95 | HR 75 | Temp 98.3°F | Ht 64.0 in | Wt 199.0 lb

## 2019-04-12 DIAGNOSIS — U071 COVID-19: Secondary | ICD-10-CM

## 2019-04-12 DIAGNOSIS — R4182 Altered mental status, unspecified: Secondary | ICD-10-CM | POA: Diagnosis not present

## 2019-04-12 DIAGNOSIS — N183 Chronic kidney disease, stage 3 unspecified: Secondary | ICD-10-CM | POA: Diagnosis not present

## 2019-04-12 DIAGNOSIS — R531 Weakness: Secondary | ICD-10-CM | POA: Diagnosis present

## 2019-04-12 DIAGNOSIS — R251 Tremor, unspecified: Secondary | ICD-10-CM | POA: Diagnosis not present

## 2019-04-12 DIAGNOSIS — E039 Hypothyroidism, unspecified: Secondary | ICD-10-CM | POA: Insufficient documentation

## 2019-04-12 DIAGNOSIS — Z9289 Personal history of other medical treatment: Secondary | ICD-10-CM

## 2019-04-12 DIAGNOSIS — Z853 Personal history of malignant neoplasm of breast: Secondary | ICD-10-CM | POA: Diagnosis not present

## 2019-04-12 HISTORY — DX: Personal history of other medical treatment: Z92.89

## 2019-04-12 LAB — CBC WITH DIFFERENTIAL/PLATELET
Abs Immature Granulocytes: 0.04 10*3/uL (ref 0.00–0.07)
Basophils Absolute: 0.1 10*3/uL (ref 0.0–0.1)
Basophils Relative: 0 %
Eosinophils Absolute: 0.1 10*3/uL (ref 0.0–0.5)
Eosinophils Relative: 1 %
HCT: 46.1 % — ABNORMAL HIGH (ref 36.0–46.0)
Hemoglobin: 14.4 g/dL (ref 12.0–15.0)
Immature Granulocytes: 0 %
Lymphocytes Relative: 25 %
Lymphs Abs: 3 10*3/uL (ref 0.7–4.0)
MCH: 30 pg (ref 26.0–34.0)
MCHC: 31.2 g/dL (ref 30.0–36.0)
MCV: 96 fL (ref 80.0–100.0)
Monocytes Absolute: 1 10*3/uL (ref 0.1–1.0)
Monocytes Relative: 8 %
Neutro Abs: 8.1 10*3/uL — ABNORMAL HIGH (ref 1.7–7.7)
Neutrophils Relative %: 66 %
Platelets: 249 10*3/uL (ref 150–400)
RBC: 4.8 MIL/uL (ref 3.87–5.11)
RDW: 13.8 % (ref 11.5–15.5)
WBC: 12.2 10*3/uL — ABNORMAL HIGH (ref 4.0–10.5)
nRBC: 0 % (ref 0.0–0.2)

## 2019-04-12 LAB — URINALYSIS, ROUTINE W REFLEX MICROSCOPIC
Bilirubin Urine: NEGATIVE
Glucose, UA: NEGATIVE mg/dL
Hgb urine dipstick: NEGATIVE
Ketones, ur: NEGATIVE mg/dL
Leukocytes,Ua: NEGATIVE
Nitrite: NEGATIVE
Protein, ur: NEGATIVE mg/dL
Specific Gravity, Urine: 1.009 (ref 1.005–1.030)
pH: 6 (ref 5.0–8.0)

## 2019-04-12 LAB — POCT I-STAT EG7
Acid-base deficit: 3 mmol/L — ABNORMAL HIGH (ref 0.0–2.0)
Bicarbonate: 21.5 mmol/L (ref 20.0–28.0)
Calcium, Ion: 1.23 mmol/L (ref 1.15–1.40)
HCT: 44 % (ref 36.0–46.0)
Hemoglobin: 15 g/dL (ref 12.0–15.0)
O2 Saturation: 58 %
Potassium: 3.7 mmol/L (ref 3.5–5.1)
Sodium: 141 mmol/L (ref 135–145)
TCO2: 23 mmol/L (ref 22–32)
pCO2, Ven: 36.2 mmHg — ABNORMAL LOW (ref 44.0–60.0)
pH, Ven: 7.383 (ref 7.250–7.430)
pO2, Ven: 30 mmHg — CL (ref 32.0–45.0)

## 2019-04-12 LAB — COMPREHENSIVE METABOLIC PANEL
ALT: 20 U/L (ref 0–44)
AST: 19 U/L (ref 15–41)
Albumin: 4.3 g/dL (ref 3.5–5.0)
Alkaline Phosphatase: 105 U/L (ref 38–126)
Anion gap: 11 (ref 5–15)
BUN: 23 mg/dL (ref 8–23)
CO2: 21 mmol/L — ABNORMAL LOW (ref 22–32)
Calcium: 9.8 mg/dL (ref 8.9–10.3)
Chloride: 109 mmol/L (ref 98–111)
Creatinine, Ser: 2.67 mg/dL — ABNORMAL HIGH (ref 0.44–1.00)
GFR calc Af Amer: 20 mL/min — ABNORMAL LOW (ref >60)
GFR calc non Af Amer: 17 mL/min — ABNORMAL LOW (ref >60)
Glucose, Bld: 102 mg/dL — ABNORMAL HIGH (ref 70–99)
Potassium: 3.7 mmol/L (ref 3.5–5.1)
Sodium: 141 mmol/L (ref 135–145)
Total Bilirubin: 1.3 mg/dL — ABNORMAL HIGH (ref 0.3–1.2)
Total Protein: 7.7 g/dL (ref 6.5–8.1)

## 2019-04-12 LAB — LITHIUM LEVEL: Lithium Lvl: 0.87 mmol/L (ref 0.60–1.20)

## 2019-04-12 MED ORDER — LACTATED RINGERS IV BOLUS
1000.0000 mL | Freq: Once | INTRAVENOUS | Status: AC
  Administered 2019-04-12: 1000 mL via INTRAVENOUS

## 2019-04-12 MED ORDER — LACTATED RINGERS IV BOLUS
500.0000 mL | Freq: Once | INTRAVENOUS | Status: AC
  Administered 2019-04-12: 500 mL via INTRAVENOUS

## 2019-04-12 NOTE — ED Notes (Signed)
This RN performed an In-and-Out catheterization with verbal order from Dr. Dayna Barker

## 2019-04-12 NOTE — ED Notes (Addendum)
Per Lab, UA did not have enough urine to be tested; will reassess to collect more for another UA. Pt made aware as well.

## 2019-04-12 NOTE — Progress Notes (Signed)
Virtual Visit via Video Note  Subjective  CC:  Chief Complaint  Patient presents with  . [id number] positive    Patient was seen in the ER and was tested positive on 04/03/19 and went back to the Er on 04/06/2019 for SOB.  . Nasal Congestion  . Altered Mental Status    Patient's confusion is worsening since Covid.  . Weight Loss    no appetite     I connected with Tiffany [name] on 04/12/19 at  4:20 PM EST by a video enabled telemedicine application and verified that I am speaking with the correct person using two identifiers. Location patient: Home Location provider: [facility] [place], Office Persons participating in the virtual visit: Tiffany [name], Tiffany Arnt, MD Tiffany [name], [place] discussed the limitations of evaluation and management by telemedicine and the availability of in person appointments. The patient expressed understanding and agreed to proceed. HPI: Tiffany [name] is a 69 y.o. female who was contacted today to address the problems listed above in the chief complaint. . Complicated pmh w/ h/o tardive dyskinesia, mood disorders on several mood medications including lithium, and recent dx of covid. Fortunately, her respiratory status has improved since her most recent Er visit for same. However, her mental status and speech have changed. She is hesitant with her speech and having problems with getting the words out. She is confused; her husband is with her and reports she is not making sense at times: " I need a straw" when the straw is right in front of her. Her appetite is down and she is not drinking well. She denies problems swallowing, double vision, paresis or headache; however she feels mentally slowed.  . I reviewed both recent ER visits. Labs and cxr were reassuring. She did receive an antibody infusion therapy last week due to her high risk status.  Assessment  1. COVID-19   2. Altered mental status, unspecified altered mental status type        Plan   Due to change in mental status from baseline, needs further evaluation:  i'm concerned about possible lithium toxicity given poor hydration in setting of covid. Stroke is also in differential but needs neuro exam. Recommend ER evaluation for blood work and CT of brain. This could be worsening tardive dyskinesia in setting of multiple mood meds and acute on chronic renal failure if dehydrated. Again, need labs to evaluate. Also could have electrolyte abnormalities. Pt's husband will take to ER for evaluation. She appears hemodynamically stable on video now. Her mental status is different from her baseline.  I discussed the assessment and treatment plan with the patient. The patient was provided an opportunity to ask questions and all were answered. The patient agreed with the plan and demonstrated an understanding of the instructions.   The patient was advised to call back or seek an in-person evaluation if the symptoms worsen or if the condition fails to improve as anticipated. Follow up: No follow-ups on file.  04/27/2019  No orders of the defined types were placed in this encounter.     I reviewed the patients updated PMH, FH, and SocHx.    Patient Active Problem List   Diagnosis Date Noted  . Thrombocytopenia ([place]) 09/04/2018  . Vitamin D deficiency 09/04/2018  . Psoriatic arthritis ([place]) 09/04/2018  . History of colonic polyps 05/28/2017  . 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 Malignant neoplasm of overlapping sites of  left breast in female, estrogen receptor positive ([place]) 03/14/2016  . Chronic kidney disease, stage III (moderate) 01/19/2016  . Tardive dyskinesia 10/18/2015  . HX: breast cancer 12/27/2014  . Osteopenia determined by x-ray 10/31/2014  . Rosacea 05/21/2007  . Bipolar affective disorder, mixed ([place]) 08/16/2004  . Acquired hypothyroidism 04/03/1996   Current Meds  Medication Sig  . acetaminophen (TYLENOL) 500 MG  tablet Take 0.5 tablets by mouth as needed for pain.  . Azelaic Acid 15 % cream Apply topically daily.  . calcitRIOL (ROCALTROL) 0.25 MCG capsule Take 1 capsule by mouth 3 (three) times a week.  . Certolizumab Pegol 2 X 200 MG KIT Inject 2 each into the skin every 30 (thirty) days.  . Cholecalciferol 50 MCG (2000 UT) TABS Take 1-2 tablets by mouth daily.  . clonazePAM (KLONOPIN) 0.5 MG tablet Take 1 to 2 tablets at bedtime.  . cyanocobalamin 100 MCG tablet Take 1 tablet by mouth daily.  . INGREZZA 40 MG CAPS Take 1 capsule by mouth at bedtime.  . lamoTRIgine (LAMICTAL) 100 MG tablet Take three tablets daily.  Marland Kitchen levothyroxine (SYNTHROID) 125 MCG tablet Take 1 tablet by mouth daily.  Marland Kitchen lidocaine 4 % dressing Apply topically.  Marland Kitchen lithium carbonate (LITHOBID) 300 MG CR tablet Take 1 tablet by mouth daily.  Marland Kitchen LORazepam (ATIVAN) 0.5 MG tablet Take 1 tablet (0.5 mg total) by mouth daily as needed.  Marland Kitchen PEG-KCl-NaCl-NaSulf-Na Asc-C (PLENVU) 140 g SOLR Take 1 kit by mouth as directed.  . sertraline (ZOLOFT) 25 MG tablet Take 2 tablets (50 mg total) by mouth daily.    Allergies: Patient is allergic to aripiprazole; methotrexate; amoxicillin-pot clavulanate; cefdinir; etanercept; exemestane; fluoxetine; methylprednisolone sodium succ; epinephrine; and nitrofurantoin. Family History: Patient family history includes Cancer in her mother, sister, and sister; Diabetes in her paternal grandfather; Stroke in her maternal grandfather. Social History:  Patient  reports that she has never smoked. She has never used smokeless tobacco. She reports that she does not drink alcohol or use drugs.  Review of Systems: Constitutional: + for fever malaise or anorexia Cardiovascular: negative for chest pain Respiratory: negative for SOB or persistent cough Gastrointestinal: negative for abdominal pain  OBJECTIVE Vitals: BP (!) 163/95 (BP Location: Right Arm, Patient Position: Sitting)   Pulse 75   Temp 98.3 F (36.8  C) (Temporal)   Ht 5' 4"  (1.626 m)   Wt 199 lb (90.3 kg)   SpO2 96%   BMI 34.16 kg/m  General:appears sluggish. Speech is slow she is able to answer questions but processing is slow. Lip smacking present.  Mental status : appears sedated.   Tiffany Arnt, MD

## 2019-04-12 NOTE — ED Notes (Signed)
Pt transported back from CT.  

## 2019-04-12 NOTE — ED Notes (Signed)
Pt BIB Family with AMS. Pt has been COVID+ for 1-2 weeks and has been experiencing Generalized Aches, Fever, Diarrhea  Pt is now experiencing AMS with confusion related to self, and time.   Family at bedside.

## 2019-04-12 NOTE — ED Provider Notes (Signed)
Emergency Department Provider Note   I have reviewed the triage vital signs and the nursing notes.   HISTORY  Chief Complaint No chief complaint on file.   HPI Tiffany [name] is a 69 y.o. female who presents the emergency department today after an ED visit with her primary doctor.  Patient was diagnosed with coronavirus about a week ago and then over the last few days has had increasing weakness and decreased p.o. intake.  Had a visit with primary care doctor who wanted her to be evaluated for stroke and for dehydration.  Husband states that she also has some diarrhea last week.  No fevers.  He thought she might have been some slurred speech sometime recently but think she is speaking clear now.  She also has had some word finding difficulties has been intermittent though.  No focal weakness.  She does have what sounds like parkinsonism versus tardive dyskinesia that is more chronic in nature.  Recently she is been afebrile without a cough.  No urinary symptoms.   No other associated or modifying symptoms.    Past Medical History:  Diagnosis Date  . Bipolar 1 disorder ([place])   . Depression 1987  . Parkinson's disease ([place]) 2012  . Psoriatic arthritis ([place])   . PTSD (post-traumatic stress disorder)     Patient Active Problem List   Diagnosis Date Noted  . Thrombocytopenia ([place]) 09/04/2018  . Vitamin D deficiency 09/04/2018  . Psoriatic arthritis ([place]) 09/04/2018  . History of colonic polyps 05/28/2017  . 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 Malignant neoplasm of overlapping sites of left breast in female, estrogen receptor positive ([place]) 03/14/2016  . Chronic kidney disease, stage III (moderate) 01/19/2016  . Tardive dyskinesia 10/18/2015  . HX: breast cancer 12/27/2014  . Osteopenia determined by x-ray 10/31/2014  . Rosacea 05/21/2007  . Bipolar affective disorder, mixed ([place]) 08/16/2004  . Acquired hypothyroidism 04/03/1996     Past Surgical History:  Procedure Laterality Date  . ABDOMINAL HYSTERECTOMY  1987  . CHOLECYSTECTOMY  1979  . DILATION AND CURETTAGE OF UTERUS  1973  . MASTECTOMY Bilateral 09/19/2014  . TONSILLECTOMY  1970  . URETERAL REIMPLANTION Bilateral 1974    Current Outpatient Rx  . Order #: [id number] Class: Historical Med  . Order #: [id number] Class: Historical Med  . Order #: [id number] Class: Historical Med  . Order #: [id number] Class: Historical Med  . Order #: [id number] Class: Normal  . Order #: [id number] Class: Historical Med  . Order #: [id number] Class: Normal  . Order #: [id number] Class: Normal  . Order #: [id number] Class: Historical Med  . Order #: [id number] Class: Historical Med  . Order #: [id number] Class: Normal  . Order #: [id number] Class: Normal  . Order #: [id number] Class: Historical Med    Allergies Aripiprazole, Methotrexate, Amoxicillin-pot clavulanate, Cefdinir, Etanercept, Exemestane, Fluoxetine, Methylprednisolone sodium succ, Epinephrine, and Nitrofurantoin  Family History  Problem Relation Age of Onset  . Cancer Mother   . Cancer Sister   . Stroke Maternal Grandfather   . Diabetes Paternal Grandfather   . Cancer Sister     Social History Social History   Tobacco Use  . Smoking status: Never Smoker  . Smokeless tobacco: Never Used  Substance Use Topics  . Alcohol use: Never  . Drug use: Never    Review of Systems  All other systems negative except as documented in the HPI. All pertinent positives and negatives as reviewed in the HPI. ____________________________________________   PHYSICAL EXAM:  VITAL SIGNS: Vitals:  04/12/19 2000 04/12/19 2045 04/12/19 2100 04/13/19 0052  BP: (!) 157/84 (!) 169/88 (!) 159/84 (!) 158/72  Pulse: 77 82 75 76  Resp: 20 17 14 18   Temp:    98.2 F (36.8 C)  TempSrc:    Oral  SpO2: 95% 95% 95% 96%    Constitutional: Alert and oriented. Well appearing and in no acute distress. Eyes: Conjunctivae are normal. PERRL.  EOMI. Head: Atraumatic. Nose: No congestion/rhinnorhea. Mouth/Throat: Mucous membranes are dry.  Oropharynx non-erythematous. Neck: No stridor.  No meningeal signs.   Cardiovascular: Normal rate, regular rhythm. Good peripheral circulation. Grossly normal heart sounds.   Respiratory: Normal respiratory effort.  No retractions. Lungs CTAB. Gastrointestinal: Soft and nontender. No distention.  Musculoskeletal: No lower extremity tenderness nor edema. No gross deformities of extremities. Neurologic:  Diffuse tremors. Clear speech. Possibly left facial droop. Symmetric motor and sensation in BUE/BLE.  Skin:  Skin is warm, dry and intact. No rash noted.  ____________________________________________   LABS (all labs ordered are listed, but only abnormal results are displayed)  Labs Reviewed  CBC WITH DIFFERENTIAL/PLATELET - Abnormal; Notable for the following components:      Result Value   WBC 12.2 (*)    HCT 46.1 (*)    Neutro Abs 8.1 (*)    All other components within normal limits  COMPREHENSIVE METABOLIC PANEL - Abnormal; Notable for the following components:   CO2 21 (*)    Glucose, Bld 102 (*)    Creatinine, Ser 2.67 (*)    Total Bilirubin 1.3 (*)    GFR calc non Af Amer 17 (*)    GFR calc Af Amer 20 (*)    All other components within normal limits  POCT I-STAT EG7 - Abnormal; Notable for the following components:   pCO2, Ven 36.2 (*)    pO2, Ven 30.0 (*)    Acid-base deficit 3.0 (*)    All other components within normal limits  LITHIUM LEVEL  URINALYSIS, ROUTINE W REFLEX MICROSCOPIC  I-STAT VENOUS BLOOD GAS, ED   ____________________________________________  EKG   EKG Interpretation  Date/Time:  Monday April 12 2019 18:23:49 EST Ventricular Rate:  81 PR Interval:    QRS Duration: 95 QT Interval:  407 QTC Calculation: 473 R Axis:   6 Text Interpretation: Sinus rhythm Probable left atrial enlargement given artifact in both ecss there is no obvious changes  Confirmed by Merrily Pew [phone]) on 04/12/2019 7:06:59 PM      ____________________________________________  RADIOLOGY  No results found. ____________________________________________   PROCEDURES  Procedure(s) performed:   Procedures   ____________________________________________   INITIAL IMPRESSION / ASSESSMENT AND PLAN / ED COURSE  eval for lithium toxicity vs uremia vs stroke.   Overall her work-up is unremarkable.  After discussion with multiple family members it appears that the patient might actually be similar to baseline with maybe some mild dehydration.  Patient with some fluids here no recurrence of symptoms.  Overall patient appears to be at a reported baseline stable.  Will have her follow-up with neurologist for further management.  Pertinent labs & imaging results that were available during my care of the patient were reviewed by me and considered in my medical decision making (see chart for details).   A medical screening exam was performed and I feel the patient has had an appropriate workup for their chief complaint at this time and likelihood of emergent condition existing is low. They have been counseled on decision, discharge, follow up and which symptoms necessitate  immediate return to the emergency department. They or their family verbally stated understanding and agreement with plan and discharged in stable condition.   ____________________________________________  FINAL CLINICAL IMPRESSION(S) / ED DIAGNOSES  Final diagnoses:  Tremors of nervous system  COVID-19     MEDICATIONS GIVEN DURING THIS VISIT:  Medications  lactated ringers bolus 1,000 mL (0 mLs Intravenous Stopped 04/12/19 1936)  lactated ringers bolus 500 mL (0 mLs Intravenous Stopped 04/12/19 2122)     NEW OUTPATIENT MEDICATIONS STARTED DURING THIS VISIT:  Discharge Medication List as of 04/12/2019 11:42 PM      Note:  This note was prepared with assistance of Dragon voice  recognition software. Occasional wrong-word or sound-a-like substitutions may have occurred due to the inherent limitations of voice recognition software.   Merrily Pew, MD 04/14/19 2329

## 2019-04-12 NOTE — Telephone Encounter (Signed)
Pt had OV today. See notes. Sent to ER for evaluation due to AMS.

## 2019-04-13 ENCOUNTER — Ambulatory Visit: Payer: Medicare Other | Admitting: Gastroenterology

## 2019-04-13 NOTE — ED Notes (Signed)
Pt was discharged from the ED. Pt read and understood discharge paperwork. Pt had vital signs completed. Pt conscious, breathing, and A&Ox4. No distress noted. Pt speaking in complete sentences. Pt pushed out of the ED in a wheelchair by the husband.

## 2019-04-14 ENCOUNTER — Ambulatory Visit: Payer: Medicare Other | Admitting: Psychology

## 2019-04-14 ENCOUNTER — Ambulatory Visit (INDEPENDENT_AMBULATORY_CARE_PROVIDER_SITE_OTHER): Payer: Medicare Other | Admitting: Adult Health

## 2019-04-14 ENCOUNTER — Other Ambulatory Visit: Payer: Self-pay

## 2019-04-14 ENCOUNTER — Encounter: Payer: Self-pay | Admitting: Adult Health

## 2019-04-14 DIAGNOSIS — F319 Bipolar disorder, unspecified: Secondary | ICD-10-CM | POA: Diagnosis not present

## 2019-04-14 DIAGNOSIS — G4700 Insomnia, unspecified: Secondary | ICD-10-CM

## 2019-04-14 DIAGNOSIS — F411 Generalized anxiety disorder: Secondary | ICD-10-CM

## 2019-04-14 DIAGNOSIS — F4310 Post-traumatic stress disorder, unspecified: Secondary | ICD-10-CM

## 2019-04-14 NOTE — Progress Notes (Signed)
Tiffany [name] [id number] 1951/01/12 69 y.o.  Subjective:   Patient ID:  Tiffany [name] is a 69 y.o. (DOB April 06, 1950) female.  Chief Complaint: No chief complaint on file.   HPI   [name] presents to the office today for follow-up of PTSD, insomnia,GAD, BPD 1.  Accompanied by husband  Describes mood today as "not the best. Pleasant. Flat Mood symptoms - feels depressed, anxious and irritable. Recently diagnosed with Covid -19 - "recovering". Reports having memory problems, severe tremors, difficulty walking and difficulty writing. Symptoms have worsened over last 3 weeks. Feels like her symptoms are similar to medication induced Parkinsonism. Having balance problems, shuffling gait, tremors, difficulties feeding herself, finishing sentences. Feels like Sertraline is the "culprit" as it has caused similar symptoms at various dosages. Wanting to reduce Sertaline to 37.5mg  daily to see if it helps with symptoms. Talked with therapist yesterday. Varying interest and motivation. Taking medications as prescribed.  Energy levels low. Active, does not have a regular exercise routine with current physical disabilities.   Enjoys some usual interests and activities. Married. Lives with husband of 65 years. Talking to sister and son.  Appetite adequate. Weight stable. Husband helping her stay hydrated and fed. Sleeps better some nights than others. Averages 12 to 16 hours. Focus and concentration difficulties. Completing tasks. Managing aspects of household.  Denies SI or HI. Denies AH or VH.   Previous medications: Celexa, Zyprexa, Tegretol, Depakote, Serzone, Topamax, Seroquel, Effexor, Lexapro, Desipramine, Neurontin, Abilify, Geodon, Propanolol, Cymbalta, Cogentin, Trihexyphenadyl, Simet, Provigil, Selegiline, Requip, Amantadine, Prozac, Mirapex, Azilect, Metoclopramide, Baclofen, Artane, Namenda, Latuda.  Review of Systems:  Review of Systems  Musculoskeletal: Positive for gait problem.   Neurological: Positive for tremors and weakness.  Psychiatric/Behavioral: Positive for confusion. The patient is nervous/anxious.        Please refer to HPI    Medications: I have reviewed the patient's current medications.  Current Outpatient Medications  Medication Sig Dispense Refill  . acetaminophen (TYLENOL) 500 MG tablet Take 500 mg by mouth daily as needed for headache (pain).     . calcitRIOL (ROCALTROL) 0.25 MCG capsule Take 0.25 mcg by mouth every Monday, Wednesday, and Friday.     . Certolizumab Pegol (CIMZIA [place]) Inject into the skin every 28 (twenty-eight) days. Administered by [facility] - for psoriatic arthritis    . CHOLECALCIFEROL PO Take 1-2 tablets by mouth See admin instructions. Take 1 tablet by mouth 1st day, then take 2 tablets by mouth 2nd day, then repeat    . clonazePAM (KLONOPIN) 0.5 MG tablet Take 1 to 2 tablets at bedtime. (Patient taking differently: Take 0.5 mg by mouth at bedtime. ) 60 tablet 2  . Cyanocobalamin (VITAMIN B-12 PO) Take 1 tablet by mouth daily with breakfast.    . INGREZZA 40 MG CAPS Take 1 capsule by mouth at bedtime. (Patient taking differently: Take 40 mg by mouth at bedtime. ) 30 capsule 2  . lamoTRIgine (LAMICTAL) 100 MG tablet Take three tablets daily. (Patient taking differently: Take 300 mg by mouth at bedtime. Take three tablets daily.) 270 tablet 1  . levothyroxine (SYNTHROID) 125 MCG tablet Take 125 mcg by mouth daily before breakfast.     . lithium carbonate (LITHOBID) 300 MG CR tablet Take 300 mg by mouth at bedtime.     Marland Kitchen LORazepam (ATIVAN) 0.5 MG tablet Take 1 tablet (0.5 mg total) by mouth daily as needed. (Patient taking differently: Take 0.5 mg by mouth daily as needed for anxiety. ) 30 tablet 2  .  sertraline (ZOLOFT) 25 MG tablet Take 2 tablets (50 mg total) by mouth daily. (Patient taking differently: Take 50 mg by mouth daily with breakfast. ) 180 tablet 1  . sodium bicarbonate 650 MG tablet Take 650 mg by mouth 2  (two) times daily.     No current facility-administered medications for this visit.    Medication Side Effects: None  Allergies:  Allergies  Allergen Reactions  . Aripiprazole Other (See Comments)    Parkinsonism     . Methotrexate Other (See Comments)    Hair loss, severe stomatitis    . Amoxicillin-Pot Clavulanate Other (See Comments)    Unknown reaction   . Cefdinir Other (See Comments)    Other reaction(s): Diarrhea Yeast infection and fever; negative c diff  . Etanercept Other (See Comments)    Headaches    . Exemestane Other (See Comments)    Suicidal thoughts with medication    . Fluoxetine Other (See Comments)    Parkinsonism  . Methylprednisolone Sodium Succ Other (See Comments)    Agitated mania  . Epinephrine Palpitations    tachycardia   . Nitrofurantoin Nausea And Vomiting and Rash         Past Medical History:  Diagnosis Date  . Bipolar 1 disorder ([place])   . Depression 1987  . Parkinson's disease ([place]) 2012  . Psoriatic arthritis ([place])   . PTSD (post-traumatic stress disorder)     Family History  Problem Relation Age of Onset  . Cancer Mother   . Cancer Sister   . Stroke Maternal Grandfather   . Diabetes Paternal Grandfather   . Cancer Sister     Social History   Socioeconomic History  . Marital status: Married    Spouse name: Not on file  . Number of children: Not on file  . Years of education: Not on file  . Highest education level: Not on file  Occupational History  . Not on file  Tobacco Use  . Smoking status: Never Smoker  . Smokeless tobacco: Never Used  Substance and Sexual Activity  . Alcohol use: Never  . Drug use: Never  . Sexual activity: Not Currently  Other Topics Concern  . Not on file  Social History Narrative   Moved to area from [place] 08/2018   Social Determinants of Health   Financial Resource Strain:   . Difficulty of Paying Living Expenses: Not on file  Food Insecurity:   . Worried About  Charity fundraiser in the Last Year: Not on file  . Ran Out of Food in the Last Year: Not on file  Transportation Needs:   . Lack of Transportation (Medical): Not on file  . Lack of Transportation (Non-Medical): Not on file  Physical Activity:   . Days of Exercise per Week: Not on file  . Minutes of Exercise per Session: Not on file  Stress:   . Feeling of Stress : Not on file  Social Connections:   . Frequency of Communication with Friends and Family: Not on file  . Frequency of Social Gatherings with Friends and Family: Not on file  . Attends Religious Services: Not on file  . Active Member of Clubs or Organizations: Not on file  . Attends Archivist Meetings: Not on file  . Marital Status: Not on file  Intimate Partner Violence:   . Fear of Current or Ex-Partner: Not on file  . Emotionally Abused: Not on file  . Physically Abused: Not on file  .  Sexually Abused: Not on file    Past Medical History, Surgical history, Social history, and Family history were reviewed and updated as appropriate.   Please see review of systems for further details on the patient's review from today.   Objective:   Physical Exam:  There were no vitals taken for this visit.  Physical Exam Constitutional:      General: She is not in acute distress.    Appearance: She is well-developed.  Musculoskeletal:        General: No deformity.  Neurological:     Mental Status: She is alert and oriented to person, place, and time.     Coordination: Coordination normal.  Psychiatric:        Attention and Perception: Attention and perception normal. She does not perceive auditory or visual hallucinations.        Mood and Affect: Mood is depressed. Mood is not anxious. Affect is not labile, blunt, angry or inappropriate.        Speech: Speech normal.        Behavior: Behavior normal.        Thought Content: Thought content normal. Thought content is not paranoid or delusional. Thought content  does not include homicidal or suicidal ideation. Thought content does not include homicidal or suicidal plan.        Cognition and Memory: Cognition and memory normal.        Judgment: Judgment normal.     Comments: Insight intact     Lab Review:     Component Value Date/Time   NA 141 04/12/2019 1821   NA 143 09/29/2018 0000   K 3.7 04/12/2019 1821   CL 109 04/12/2019 1812   CO2 21 (L) 04/12/2019 1812   GLUCOSE 102 (H) 04/12/2019 1812   BUN 23 04/12/2019 1812   BUN 23 (A) 09/29/2018 0000   CREATININE 2.67 (H) 04/12/2019 1812   CALCIUM 9.8 04/12/2019 1812   PROT 7.7 04/12/2019 1812   ALBUMIN 4.3 04/12/2019 1812   AST 19 04/12/2019 1812   ALT 20 04/12/2019 1812   ALKPHOS 105 04/12/2019 1812   BILITOT 1.3 (H) 04/12/2019 1812   GFRNONAA 17 (L) 04/12/2019 1812   GFRAA 20 (L) 04/12/2019 1812       Component Value Date/Time   WBC 12.2 (H) 04/12/2019 1812   RBC 4.80 04/12/2019 1812   HGB 15.0 04/12/2019 1821   HCT 44.0 04/12/2019 1821   PLT 249 04/12/2019 1812   MCV 96.0 04/12/2019 1812   MCH 30.0 04/12/2019 1812   MCHC 31.2 04/12/2019 1812   RDW 13.8 04/12/2019 1812   LYMPHSABS 3.0 04/12/2019 1812   MONOABS 1.0 04/12/2019 1812   EOSABS 0.1 04/12/2019 1812   BASOSABS 0.1 04/12/2019 1812    Lithium Lvl  Date Value Ref Range Status  04/12/2019 0.87 0.60 - 1.20 mmol/L Final    Comment:    Performed at [facility], [place], [place], [place] [id number]     No results found for: PHENYTOIN, PHENOBARB, VALPROATE, CBMZ   .res Assessment: Plan:    Plan:  Lithium Carbonate 300mg  daily Decrease Zoloft 50mg  to 37.5mg  daily for worsening mood symptoms. Clonazepam 1mg  at hs Lorazepam 0.5mg  daily for anxiety Ingrezza 40mg  daily - cannot take a higher dose  Consider Genetic testing  Lithium level - 0.6 on 01/07/2019, 0.87 on 04/12/2019  Therapist - [name]   RTC 4 weeks  Patient advised to contact office with any questions, adverse effects, or acute  worsening in signs and symptoms.  Counseled patient regarding potential benefits, risks, and side effects of Lamictal to include potential risk of Stevens-Johnson syndrome. Advised patient to stop taking Lamictal and contact office immediately if rash develops and to seek urgent medical attention if rash is severe and/or spreading quickly.  Discussed potential benefits, risk, and side effects of benzodiazepines to include potential risk of tolerance and dependence, as well as possible drowsiness.  Advised patient not to drive if experiencing drowsiness and to take lowest possible effective dose to minimize risk of dependence and tolerance.  There are no diagnoses linked to this encounter.   Please see After Visit Summary for patient specific instructions.  Future Appointments  Date Time Provider [place]  04/22/2019  2:00 PM Cottle, Lucious Groves, LCSW [facility] None  04/27/2019  1:00 PM Leamon Arnt, MD [facility]  05/05/2019  1:00 PM Cottle, Bambi G, LCSW [facility] None  05/07/2019  2:10 PM Thornton Park, MD [facility]  05/12/2019 12:00 PM Cottle, Bambi G, LCSW [facility] None  05/12/2019  2:00 PM Natacia Chaisson, Berdie Ogren, NP [facility] None  05/19/2019  1:00 PM Cottle, Bambi G, LCSW [facility] None    No orders of the defined types were placed in this encounter.   -------------------------------

## 2019-04-20 ENCOUNTER — Telehealth: Payer: Self-pay | Admitting: Family Medicine

## 2019-04-20 NOTE — Telephone Encounter (Signed)
Please advise 

## 2019-04-20 NOTE — Telephone Encounter (Signed)
I have reviewed notes and evaluation.  She has a f/u appointment with me next week and we can discuss.  She has seen neuro last year for tremors.

## 2019-04-20 NOTE — Telephone Encounter (Signed)
Pt husband called and stated he took pt to ED like Dr. Jonni Sanger recommended. ED provider advised pt to get an appointment with neurologist as soon as possible. Pt husband is requesting a referral to neurologist. Please advise.

## 2019-04-20 NOTE — Telephone Encounter (Signed)
Pt's husband notified of message.

## 2019-04-21 ENCOUNTER — Other Ambulatory Visit: Payer: Self-pay

## 2019-04-21 ENCOUNTER — Encounter (HOSPITAL_COMMUNITY): Payer: Self-pay

## 2019-04-21 ENCOUNTER — Ambulatory Visit: Payer: Medicare Other | Admitting: Adult Health

## 2019-04-21 ENCOUNTER — Inpatient Hospital Stay (HOSPITAL_COMMUNITY)
Admission: EM | Admit: 2019-04-21 | Discharge: 2019-04-26 | DRG: 071 | Disposition: A | Discharge status: Rehabilitation Facility | Payer: Medicare Other | Attending: Internal Medicine | Admitting: Internal Medicine

## 2019-04-21 ENCOUNTER — Inpatient Hospital Stay (HOSPITAL_COMMUNITY): Payer: Medicare Other

## 2019-04-21 ENCOUNTER — Emergency Department (HOSPITAL_COMMUNITY): Payer: Medicare Other

## 2019-04-21 DIAGNOSIS — R410 Disorientation, unspecified: Secondary | ICD-10-CM | POA: Diagnosis not present

## 2019-04-21 DIAGNOSIS — R748 Abnormal levels of other serum enzymes: Secondary | ICD-10-CM | POA: Diagnosis not present

## 2019-04-21 DIAGNOSIS — I10 Essential (primary) hypertension: Secondary | ICD-10-CM | POA: Diagnosis not present

## 2019-04-21 DIAGNOSIS — T43595A Adverse effect of other antipsychotics and neuroleptics, initial encounter: Secondary | ICD-10-CM | POA: Diagnosis present

## 2019-04-21 DIAGNOSIS — N179 Acute kidney failure, unspecified: Secondary | ICD-10-CM | POA: Diagnosis present

## 2019-04-21 DIAGNOSIS — Z79890 Hormone replacement therapy (postmenopausal): Secondary | ICD-10-CM

## 2019-04-21 DIAGNOSIS — E860 Dehydration: Secondary | ICD-10-CM | POA: Diagnosis present

## 2019-04-21 DIAGNOSIS — Y92009 Unspecified place in unspecified non-institutional (private) residence as the place of occurrence of the external cause: Secondary | ICD-10-CM | POA: Diagnosis not present

## 2019-04-21 DIAGNOSIS — G40109 Localization-related (focal) (partial) symptomatic epilepsy and epileptic syndromes with simple partial seizures, not intractable, without status epilepticus: Secondary | ICD-10-CM | POA: Diagnosis present

## 2019-04-21 DIAGNOSIS — G479 Sleep disorder, unspecified: Secondary | ICD-10-CM | POA: Diagnosis not present

## 2019-04-21 DIAGNOSIS — Z888 Allergy status to other drugs, medicaments and biological substances status: Secondary | ICD-10-CM | POA: Diagnosis not present

## 2019-04-21 DIAGNOSIS — G2111 Neuroleptic induced parkinsonism: Secondary | ICD-10-CM | POA: Diagnosis present

## 2019-04-21 DIAGNOSIS — Z823 Family history of stroke: Secondary | ICD-10-CM | POA: Diagnosis not present

## 2019-04-21 DIAGNOSIS — G251 Drug-induced tremor: Secondary | ICD-10-CM | POA: Diagnosis present

## 2019-04-21 DIAGNOSIS — M8580 Other specified disorders of bone density and structure, unspecified site: Secondary | ICD-10-CM | POA: Diagnosis present

## 2019-04-21 DIAGNOSIS — F3160 Bipolar disorder, current episode mixed, unspecified: Secondary | ICD-10-CM | POA: Diagnosis present

## 2019-04-21 DIAGNOSIS — R4182 Altered mental status, unspecified: Secondary | ICD-10-CM | POA: Diagnosis present

## 2019-04-21 DIAGNOSIS — N189 Chronic kidney disease, unspecified: Secondary | ICD-10-CM | POA: Diagnosis not present

## 2019-04-21 DIAGNOSIS — F329 Major depressive disorder, single episode, unspecified: Secondary | ICD-10-CM | POA: Diagnosis not present

## 2019-04-21 DIAGNOSIS — D72829 Elevated white blood cell count, unspecified: Secondary | ICD-10-CM | POA: Diagnosis present

## 2019-04-21 DIAGNOSIS — Z9049 Acquired absence of other specified parts of digestive tract: Secondary | ICD-10-CM

## 2019-04-21 DIAGNOSIS — Z833 Family history of diabetes mellitus: Secondary | ICD-10-CM | POA: Diagnosis not present

## 2019-04-21 DIAGNOSIS — R569 Unspecified convulsions: Secondary | ICD-10-CM | POA: Diagnosis not present

## 2019-04-21 DIAGNOSIS — G249 Dystonia, unspecified: Secondary | ICD-10-CM | POA: Diagnosis present

## 2019-04-21 DIAGNOSIS — N2581 Secondary hyperparathyroidism of renal origin: Secondary | ICD-10-CM | POA: Diagnosis present

## 2019-04-21 DIAGNOSIS — T426X5A Adverse effect of other antiepileptic and sedative-hypnotic drugs, initial encounter: Secondary | ICD-10-CM | POA: Diagnosis present

## 2019-04-21 DIAGNOSIS — L4050 Arthropathic psoriasis, unspecified: Secondary | ICD-10-CM | POA: Diagnosis present

## 2019-04-21 DIAGNOSIS — R269 Unspecified abnormalities of gait and mobility: Secondary | ICD-10-CM | POA: Diagnosis present

## 2019-04-21 DIAGNOSIS — Z79899 Other long term (current) drug therapy: Secondary | ICD-10-CM | POA: Diagnosis not present

## 2019-04-21 DIAGNOSIS — U071 COVID-19: Secondary | ICD-10-CM | POA: Diagnosis not present

## 2019-04-21 DIAGNOSIS — E870 Hyperosmolality and hypernatremia: Secondary | ICD-10-CM | POA: Diagnosis present

## 2019-04-21 DIAGNOSIS — F4310 Post-traumatic stress disorder, unspecified: Secondary | ICD-10-CM | POA: Diagnosis present

## 2019-04-21 DIAGNOSIS — B370 Candidal stomatitis: Secondary | ICD-10-CM | POA: Diagnosis present

## 2019-04-21 DIAGNOSIS — F32A Depression, unspecified: Secondary | ICD-10-CM | POA: Diagnosis present

## 2019-04-21 DIAGNOSIS — Z90710 Acquired absence of both cervix and uterus: Secondary | ICD-10-CM

## 2019-04-21 DIAGNOSIS — R4189 Other symptoms and signs involving cognitive functions and awareness: Secondary | ICD-10-CM | POA: Diagnosis present

## 2019-04-21 DIAGNOSIS — I129 Hypertensive chronic kidney disease with stage 1 through stage 4 chronic kidney disease, or unspecified chronic kidney disease: Secondary | ICD-10-CM | POA: Diagnosis present

## 2019-04-21 DIAGNOSIS — Z809 Family history of malignant neoplasm, unspecified: Secondary | ICD-10-CM | POA: Diagnosis not present

## 2019-04-21 DIAGNOSIS — D696 Thrombocytopenia, unspecified: Secondary | ICD-10-CM | POA: Diagnosis present

## 2019-04-21 DIAGNOSIS — R519 Headache, unspecified: Secondary | ICD-10-CM | POA: Diagnosis not present

## 2019-04-21 DIAGNOSIS — G2401 Drug induced subacute dyskinesia: Secondary | ICD-10-CM

## 2019-04-21 DIAGNOSIS — F410 Panic disorder [episodic paroxysmal anxiety] without agoraphobia: Secondary | ICD-10-CM | POA: Diagnosis present

## 2019-04-21 DIAGNOSIS — I739 Peripheral vascular disease, unspecified: Secondary | ICD-10-CM | POA: Diagnosis present

## 2019-04-21 DIAGNOSIS — G9341 Metabolic encephalopathy: Principal | ICD-10-CM

## 2019-04-21 DIAGNOSIS — Z853 Personal history of malignant neoplasm of breast: Secondary | ICD-10-CM

## 2019-04-21 DIAGNOSIS — F419 Anxiety disorder, unspecified: Secondary | ICD-10-CM | POA: Diagnosis present

## 2019-04-21 DIAGNOSIS — R531 Weakness: Secondary | ICD-10-CM | POA: Diagnosis present

## 2019-04-21 DIAGNOSIS — F3170 Bipolar disorder, currently in remission, most recent episode unspecified: Secondary | ICD-10-CM | POA: Diagnosis not present

## 2019-04-21 DIAGNOSIS — Z8616 Personal history of COVID-19: Secondary | ICD-10-CM

## 2019-04-21 DIAGNOSIS — G9340 Encephalopathy, unspecified: Secondary | ICD-10-CM | POA: Diagnosis not present

## 2019-04-21 DIAGNOSIS — R7401 Elevation of levels of liver transaminase levels: Secondary | ICD-10-CM | POA: Diagnosis not present

## 2019-04-21 DIAGNOSIS — G20 Parkinson's disease: Secondary | ICD-10-CM | POA: Diagnosis present

## 2019-04-21 DIAGNOSIS — N183 Chronic kidney disease, stage 3 unspecified: Secondary | ICD-10-CM | POA: Diagnosis present

## 2019-04-21 DIAGNOSIS — J9811 Atelectasis: Secondary | ICD-10-CM | POA: Diagnosis present

## 2019-04-21 DIAGNOSIS — F3130 Bipolar disorder, current episode depressed, mild or moderate severity, unspecified: Secondary | ICD-10-CM | POA: Diagnosis present

## 2019-04-21 DIAGNOSIS — E039 Hypothyroidism, unspecified: Secondary | ICD-10-CM | POA: Diagnosis present

## 2019-04-21 DIAGNOSIS — F319 Bipolar disorder, unspecified: Secondary | ICD-10-CM | POA: Diagnosis present

## 2019-04-21 DIAGNOSIS — W1830XA Fall on same level, unspecified, initial encounter: Secondary | ICD-10-CM | POA: Diagnosis present

## 2019-04-21 DIAGNOSIS — W19XXXA Unspecified fall, initial encounter: Secondary | ICD-10-CM

## 2019-04-21 DIAGNOSIS — B948 Sequelae of other specified infectious and parasitic diseases: Secondary | ICD-10-CM | POA: Diagnosis not present

## 2019-04-21 DIAGNOSIS — Z880 Allergy status to penicillin: Secondary | ICD-10-CM

## 2019-04-21 DIAGNOSIS — Z9013 Acquired absence of bilateral breasts and nipples: Secondary | ICD-10-CM

## 2019-04-21 DIAGNOSIS — G9349 Other encephalopathy: Secondary | ICD-10-CM | POA: Diagnosis present

## 2019-04-21 DIAGNOSIS — Z881 Allergy status to other antibiotic agents status: Secondary | ICD-10-CM

## 2019-04-21 DIAGNOSIS — G40009 Localization-related (focal) (partial) idiopathic epilepsy and epileptic syndromes with seizures of localized onset, not intractable, without status epilepticus: Secondary | ICD-10-CM | POA: Diagnosis not present

## 2019-04-21 DIAGNOSIS — Z7982 Long term (current) use of aspirin: Secondary | ICD-10-CM | POA: Diagnosis not present

## 2019-04-21 HISTORY — DX: Noninfective gastroenteritis and colitis, unspecified: K52.9

## 2019-04-21 HISTORY — DX: Chronic kidney disease, stage 3 unspecified: N18.30

## 2019-04-21 HISTORY — DX: Drug induced subacute dyskinesia: G24.01

## 2019-04-21 HISTORY — DX: Secondary hyperparathyroidism of renal origin: N25.81

## 2019-04-21 HISTORY — DX: Malignant neoplasm of unspecified site of unspecified female breast: C50.919

## 2019-04-21 LAB — URINALYSIS, ROUTINE W REFLEX MICROSCOPIC
Bilirubin Urine: NEGATIVE
Glucose, UA: NEGATIVE mg/dL
Ketones, ur: NEGATIVE mg/dL
Leukocytes,Ua: NEGATIVE
Nitrite: NEGATIVE
Protein, ur: 100 mg/dL — AB
Specific Gravity, Urine: 1.013 (ref 1.005–1.030)
pH: 6 (ref 5.0–8.0)

## 2019-04-21 LAB — COMPREHENSIVE METABOLIC PANEL
ALT: 21 U/L (ref 0–44)
AST: 27 U/L (ref 15–41)
Albumin: 3.9 g/dL (ref 3.5–5.0)
Alkaline Phosphatase: 103 U/L (ref 38–126)
Anion gap: 11 (ref 5–15)
BUN: 17 mg/dL (ref 8–23)
CO2: 24 mmol/L (ref 22–32)
Calcium: 10 mg/dL (ref 8.9–10.3)
Chloride: 107 mmol/L (ref 98–111)
Creatinine, Ser: 2.71 mg/dL — ABNORMAL HIGH (ref 0.44–1.00)
GFR calc Af Amer: 20 mL/min — ABNORMAL LOW (ref >60)
GFR calc non Af Amer: 17 mL/min — ABNORMAL LOW (ref >60)
Glucose, Bld: 107 mg/dL — ABNORMAL HIGH (ref 70–99)
Potassium: 3.5 mmol/L (ref 3.5–5.1)
Sodium: 142 mmol/L (ref 135–145)
Total Bilirubin: 1.2 mg/dL (ref 0.3–1.2)
Total Protein: 7.2 g/dL (ref 6.5–8.1)

## 2019-04-21 LAB — CBC WITH DIFFERENTIAL/PLATELET
Abs Immature Granulocytes: 0.06 10*3/uL (ref 0.00–0.07)
Basophils Absolute: 0 10*3/uL (ref 0.0–0.1)
Basophils Relative: 0 %
Eosinophils Absolute: 0 10*3/uL (ref 0.0–0.5)
Eosinophils Relative: 0 %
HCT: 44.5 % (ref 36.0–46.0)
Hemoglobin: 13.7 g/dL (ref 12.0–15.0)
Immature Granulocytes: 0 %
Lymphocytes Relative: 9 %
Lymphs Abs: 1.6 10*3/uL (ref 0.7–4.0)
MCH: 29.5 pg (ref 26.0–34.0)
MCHC: 30.8 g/dL (ref 30.0–36.0)
MCV: 95.9 fL (ref 80.0–100.0)
Monocytes Absolute: 1.2 10*3/uL — ABNORMAL HIGH (ref 0.1–1.0)
Monocytes Relative: 7 %
Neutro Abs: 15 10*3/uL — ABNORMAL HIGH (ref 1.7–7.7)
Neutrophils Relative %: 84 %
Platelets: 215 10*3/uL (ref 150–400)
RBC: 4.64 MIL/uL (ref 3.87–5.11)
RDW: 14 % (ref 11.5–15.5)
WBC: 17.8 10*3/uL — ABNORMAL HIGH (ref 4.0–10.5)
nRBC: 0 % (ref 0.0–0.2)

## 2019-04-21 LAB — TSH: TSH: 0.095 u[IU]/mL — ABNORMAL LOW (ref 0.350–4.500)

## 2019-04-21 LAB — LITHIUM LEVEL: Lithium Lvl: 0.79 mmol/L (ref 0.60–1.20)

## 2019-04-21 LAB — C-REACTIVE PROTEIN: CRP: 1.7 mg/dL — ABNORMAL HIGH (ref <1.0)

## 2019-04-21 LAB — AMMONIA: Ammonia: 25 umol/L (ref 9–35)

## 2019-04-21 LAB — PROCALCITONIN: Procalcitonin: 0.2 ng/mL

## 2019-04-21 LAB — SEDIMENTATION RATE: Sed Rate: 30 mm/hr — ABNORMAL HIGH (ref 0–22)

## 2019-04-21 MED ORDER — LORAZEPAM 2 MG/ML IJ SOLN: 0.5000 mg | Freq: Once | INTRAMUSCULAR | Status: DC | PRN

## 2019-04-21 MED ORDER — ACETAMINOPHEN 650 MG RE SUPP: 650.0000 mg | Freq: Four times a day (QID) | RECTAL | Status: DC | PRN

## 2019-04-21 MED ORDER — LORAZEPAM 1 MG PO TABS
0.5000 mg | ORAL_TABLET | Freq: Once | ORAL | Status: AC
  Administered 2019-04-21: 0.5 mg via ORAL
  Filled 2019-04-21: qty 1

## 2019-04-21 MED ORDER — VITAMIN B-12 1000 MCG PO TABS
1000.0000 ug | ORAL_TABLET | Freq: Every day | ORAL | Status: DC
  Administered 2019-04-22 – 2019-04-23 (×2): 1000 ug via ORAL
  Filled 2019-04-21 (×2): qty 1

## 2019-04-21 MED ORDER — HEPARIN SODIUM (PORCINE) 5000 UNIT/ML IJ SOLN
5000.0000 [IU] | Freq: Three times a day (TID) | INTRAMUSCULAR | Status: DC
  Administered 2019-04-21 – 2019-04-26 (×14): 5000 [IU] via SUBCUTANEOUS
  Filled 2019-04-21 (×15): qty 1

## 2019-04-21 MED ORDER — CLONAZEPAM 0.5 MG PO TABS
0.5000 mg | ORAL_TABLET | Freq: Every day | ORAL | Status: DC
  Administered 2019-04-21 – 2019-04-25 (×5): 0.5 mg via ORAL
  Filled 2019-04-21 (×5): qty 1

## 2019-04-21 MED ORDER — LIDOCAINE 5 % EX PTCH: 1.0000 | MEDICATED_PATCH | Freq: Every day | CUTANEOUS | Status: DC | PRN

## 2019-04-21 MED ORDER — LORAZEPAM 0.5 MG PO TABS: 0.5000 mg | ORAL_TABLET | Freq: Every day | ORAL | Status: DC | PRN

## 2019-04-21 MED ORDER — ACETAMINOPHEN 325 MG PO TABS: 650.0000 mg | ORAL_TABLET | Freq: Four times a day (QID) | ORAL | Status: DC | PRN

## 2019-04-21 MED ORDER — LORAZEPAM 2 MG/ML IJ SOLN: 0.5000 mg | Freq: Four times a day (QID) | INTRAMUSCULAR | Status: DC | PRN

## 2019-04-21 MED ORDER — ONDANSETRON HCL 4 MG/2ML IJ SOLN: 4.0000 mg | Freq: Four times a day (QID) | INTRAMUSCULAR | Status: DC | PRN

## 2019-04-21 MED ORDER — ASPIRIN EC 81 MG PO TBEC
81.0000 mg | DELAYED_RELEASE_TABLET | Freq: Every day | ORAL | Status: DC
  Administered 2019-04-21 – 2019-04-26 (×6): 81 mg via ORAL
  Filled 2019-04-21 (×6): qty 1

## 2019-04-21 MED ORDER — SODIUM CHLORIDE 0.9 % IV SOLN: INTRAVENOUS | Status: DC

## 2019-04-21 MED ORDER — ONDANSETRON HCL 4 MG PO TABS: 4.0000 mg | ORAL_TABLET | Freq: Four times a day (QID) | ORAL | Status: DC | PRN

## 2019-04-21 MED ORDER — CALCITRIOL 0.25 MCG PO CAPS
0.2500 ug | ORAL_CAPSULE | ORAL | Status: DC
  Administered 2019-04-23 – 2019-04-26 (×2): 0.25 ug via ORAL
  Filled 2019-04-21 (×2): qty 1

## 2019-04-21 MED ORDER — SODIUM BICARBONATE 650 MG PO TABS
650.0000 mg | ORAL_TABLET | Freq: Two times a day (BID) | ORAL | Status: DC
  Administered 2019-04-21 – 2019-04-23 (×4): 650 mg via ORAL
  Filled 2019-04-21 (×4): qty 1

## 2019-04-21 MED ORDER — ALBUTEROL SULFATE (2.5 MG/3ML) 0.083% IN NEBU: 2.5000 mg | INHALATION_SOLUTION | Freq: Four times a day (QID) | RESPIRATORY_TRACT | Status: DC | PRN

## 2019-04-21 MED ORDER — SODIUM CHLORIDE 0.9% FLUSH
3.0000 mL | Freq: Two times a day (BID) | INTRAVENOUS | Status: DC
  Administered 2019-04-21 – 2019-04-26 (×8): 3 mL via INTRAVENOUS

## 2019-04-21 MED ORDER — LEVOTHYROXINE SODIUM 25 MCG PO TABS
125.0000 ug | ORAL_TABLET | Freq: Every day | ORAL | Status: DC
  Administered 2019-04-22: 125 ug via ORAL
  Filled 2019-04-21 (×2): qty 1

## 2019-04-21 NOTE — H&P (Addendum)
History and Physical    Tiffany [name] HUD:[id number] DOB: Dec 17, 1950 DOA: 04/21/2019  Referring MD/NP/PA: Davonna Belling, MD PCP: Tiffany Arnt, MD  Patient coming from: Home via EMS  Chief Complaint: Fall  I have personally briefly reviewed patient's old medical records in [place]   HPI: Tiffany [name] is a 69 y.o. female with medical history significant of hypothyroidism, bipolar 1 disorder, psoriatic arthritis, COVID-19 positive on 1/23.  She presents after having a fall this morning at home unable to get up and family notes worsening confusion.  Patient's husband provides most of her history.  Normally at baseline she is able to ambulate without need of assistance and utilizes a walker when traveling outside for long distances.  She is able to recognize family members, but gets confused frequently.  Notes that she is asked for pillow multiple times since being in the emergency department although she has a pillow behind her that he shown her previously.  Her husband notes that she has been tapered up and down on sertraline secondary to two issues with confusion and tremor that has been going on for several months.  Sertraline was decreased most recently about 1 week ago from 50 mg down to 37.5 mg.   In mid December Latuda was added, but thereafter discontinued due to worsening of her symptoms of confusion and tremor.  On 1/19, patient developed diarrhea and fever.  She tested positive for COVID-19 on 1/23.  Since that time husband notes that the diarrhea and fevers have resolved.  She had lost about 22 pounds during this time and still has a mild intermittent cough.  Her appetite has improved some, but she still not taking in enough fluids daily.  Patient followed up with her primary care provider on 2/1 for her symptoms of confusion and worsening tremor.  At that time they had questioned possibly had a stroke related with Covid.  She was evaluated in the emergency department and had a  negative CT scan of the head at that time.  It was recommended for patient have MRI to further rule out the possibility of stroke, but due to long wait times family decided to go home at that time.   Patient denies any other complaints at this time.   ED Course: Upon admission into the emergency department patient was noted to have a temperature of 99.1 F with blood pressure elevated up to 175/86, but all other vital signs maintained.  Labs significant for WBC 17.8, BUN 17, creatinine 2.71, and lithium level 0.79.  Chest x-ray revealed a mild residual left basilar opacity thought possibly atelectasis.  Urinalysis was unremarkable.  Nursing staff noted while trying to get the patient in Ativan for anxiety that she appeared to get choked and had concern for swallowing.  Neurology was formally consulted.  TRH called to admit.    Review of Systems  Unable to perform ROS: Mental status change  Constitutional: Positive for malaise/fatigue and weight loss.  Eyes: Negative for photophobia and pain.  Respiratory: Positive for cough. Negative for shortness of breath.   Cardiovascular: Negative for chest pain and leg swelling.  Gastrointestinal: Negative for abdominal pain, diarrhea, nausea and vomiting.  Genitourinary: Negative for dysuria and hematuria.  Musculoskeletal: Positive for falls.  Neurological: Positive for tremors.  Psychiatric/Behavioral: Positive for memory loss. The patient is nervous/anxious.     Past Medical History:  Diagnosis Date  . Bipolar 1 disorder ([place])   . Depression 1987  . Parkinson's disease ([place]) 2012  .  Psoriatic arthritis ([place])   . PTSD (post-traumatic stress disorder)     Past Surgical History:  Procedure Laterality Date  . ABDOMINAL HYSTERECTOMY  1987  . CHOLECYSTECTOMY  1979  . DILATION AND CURETTAGE OF UTERUS  1973  . MASTECTOMY Bilateral 09/19/2014  . TONSILLECTOMY  1970  . URETERAL REIMPLANTION Bilateral 1974     reports that she has never smoked.  She has never used smokeless tobacco. She reports that she does not drink alcohol or use drugs.  Allergies  Allergen Reactions  . Aripiprazole Other (See Comments)    Parkinsonism     . Methotrexate Other (See Comments)    Hair loss, severe stomatitis    . Amoxicillin-Pot Clavulanate Other (See Comments)    Unknown reaction   . Cefdinir Other (See Comments)    Other reaction(s): Diarrhea Yeast infection and fever; negative c diff  . Etanercept Other (See Comments)    Headaches    . Exemestane Other (See Comments)    Suicidal thoughts with medication    . Fluoxetine Other (See Comments)    Parkinsonism  . Methylprednisolone Sodium Succ Other (See Comments)    Agitated mania  . Epinephrine Palpitations    tachycardia   . Nitrofurantoin Nausea And Vomiting and Rash         Family History  Problem Relation Age of Onset  . Cancer Mother   . Cancer Sister   . Stroke Maternal Grandfather   . Diabetes Paternal Grandfather   . Cancer Sister     Prior to Admission medications   Medication Sig Start Date End Date Taking? Authorizing Provider  acetaminophen (TYLENOL) 500 MG tablet Take 500 mg by mouth daily as needed for headache (pain).     [provider]  calcitRIOL (ROCALTROL) 0.25 MCG capsule Take 0.25 mcg by mouth every Monday, Wednesday, and Friday.  06/18/18   [provider]  Certolizumab Pegol (CIMZIA ) Inject into the skin every 28 (twenty-eight) days. Administered by [facility] - for psoriatic arthritis    [provider]  CHOLECALCIFEROL PO Take 1-2 tablets by mouth See admin instructions. Take 1 tablet by mouth 1st day, then take 2 tablets by mouth 2nd day, then repeat    [provider]  clonazePAM (KLONOPIN) 0.5 MG tablet Take 1 to 2 tablets at bedtime. Patient taking differently: Take 0.5 mg by mouth at bedtime.  01/04/19   Mozingo, Berdie Ogren, NP  Cyanocobalamin (VITAMIN B-12 PO) Take 1 tablet by mouth  daily with breakfast.    [provider]  INGREZZA 40 MG CAPS Take 1 capsule by mouth at bedtime. Patient taking differently: Take 40 mg by mouth at bedtime.  03/30/19   Mozingo, Berdie Ogren, NP  lamoTRIgine (LAMICTAL) 100 MG tablet Take three tablets daily. Patient taking differently: Take 300 mg by mouth at bedtime. Take three tablets daily. 01/04/19   Mozingo, Berdie Ogren, NP  levothyroxine (SYNTHROID) 125 MCG tablet Take 125 mcg by mouth daily before breakfast.  08/24/18   [provider]  lithium carbonate (LITHOBID) 300 MG CR tablet Take 300 mg by mouth at bedtime.  08/21/18   [provider]  LORazepam (ATIVAN) 0.5 MG tablet Take 1 tablet (0.5 mg total) by mouth daily as needed. Patient taking differently: Take 0.5 mg by mouth daily as needed for anxiety.  01/04/19   Mozingo, Berdie Ogren, NP  sertraline (ZOLOFT) 25 MG tablet Take 2 tablets (50 mg total) by mouth daily. Patient taking differently: Take 50 mg  by mouth daily with breakfast.  01/04/19   Mozingo, Berdie Ogren, NP  sodium bicarbonate 650 MG tablet Take 650 mg by mouth 2 (two) times daily.    [provider]    Physical Exam:  Constitutional: Elderly female who appears anxious Vitals:   04/21/19 0900 04/21/19 0915 04/21/19 0930 04/21/19 0945  BP: (!) 160/84 (!) 157/95 (!) 146/84 (!) 168/87  Pulse: 83 85 83 87  Resp: 16     Temp:      TempSrc:      SpO2: 98% 97% 96% 98%  Weight:      Height:       Eyes: PERRL, lids and conjunctivae normal ENMT: Mucous membranes are moist. Posterior pharynx clear of any exudate or lesions.  Neck: normal, supple, no masses, no thyromegaly Respiratory: clear to auscultation bilaterally, no wheezing, no crackles. Normal respiratory effort. No accessory muscle use.  Cardiovascular: Regular rate and rhythm, no murmurs / rubs / gallops. No extremity edema. 2+ pedal pulses. No carotid bruits.  O2 saturations maintained on room air. Abdomen: no  tenderness, no masses palpated. No hepatosplenomegaly. Bowel sounds positive.  Musculoskeletal: no clubbing / cyanosis. No joint deformity upper and lower extremities. Good ROM, no contractures. Normal muscle tone.  Skin: no rashes, lesions, ulcers. No induration Neurologic: CN 2-12 grossly intact. Sensation intact, DTR normal. Strength 5/5 in all 4.  Tremor present. Psychiatric:  Alert and oriented only to person and place.  Anxious mood.     Labs on Admission: I have personally reviewed following labs and imaging studies  CBC: Recent Labs  Lab 04/21/19 0728  WBC 17.8*  NEUTROABS 15.0*  HGB 13.7  HCT 44.5  MCV 95.9  PLT 601   Basic Metabolic Panel: Recent Labs  Lab 04/21/19 0728  NA 142  K 3.5  CL 107  CO2 24  GLUCOSE 107*  BUN 17  CREATININE 2.71*  CALCIUM 10.0   GFR: Estimated Creatinine Clearance: 21 mL/min (A) (by C-G formula based on SCr of 2.71 mg/dL (H)). Liver Function Tests: Recent Labs  Lab 04/21/19 0728  AST 27  ALT 21  ALKPHOS 103  BILITOT 1.2  PROT 7.2  ALBUMIN 3.9   No results for input(s): LIPASE, AMYLASE in the last 168 hours. No results for input(s): AMMONIA in the last 168 hours. Coagulation Profile: No results for input(s): INR, PROTIME in the last 168 hours. Cardiac Enzymes: No results for input(s): CKTOTAL, CKMB, CKMBINDEX, TROPONINI in the last 168 hours. BNP (last 3 results) No results for input(s): PROBNP in the last 8760 hours. HbA1C: No results for input(s): HGBA1C in the last 72 hours. CBG: No results for input(s): GLUCAP in the last 168 hours. Lipid Profile: No results for input(s): CHOL, HDL, LDLCALC, TRIG, CHOLHDL, LDLDIRECT in the last 72 hours. Thyroid Function Tests: No results for input(s): TSH, T4TOTAL, FREET4, T3FREE, THYROIDAB in the last 72 hours. Anemia Panel: No results for input(s): VITAMINB12, FOLATE, FERRITIN, TIBC, IRON, RETICCTPCT in the last 72 hours. Urine analysis:    Component Value Date/Time    COLORURINE YELLOW 04/21/2019 0916   APPEARANCEUR HAZY (A) 04/21/2019 0916   LABSPEC 1.013 04/21/2019 0916   PHURINE 6.0 04/21/2019 0916   GLUCOSEU NEGATIVE 04/21/2019 0916   HGBUR LARGE (A) 04/21/2019 0916   BILIRUBINUR NEGATIVE 04/21/2019 0916   KETONESUR NEGATIVE 04/21/2019 0916   PROTEINUR 100 (A) 04/21/2019 0916   NITRITE NEGATIVE 04/21/2019 0916   LEUKOCYTESUR NEGATIVE 04/21/2019 0916   Sepsis Labs: No results found for this  or any previous visit (from the past 240 hour(s)).   Radiological Exams on Admission: DG Chest Portable 1 View  Result Date: 04/21/2019 CLINICAL DATA:  Weakness, recent COVID EXAM: PORTABLE CHEST 1 VIEW COMPARISON:  04/06/2019 FINDINGS: Lungs are essentially clear. Mild residual left basilar opacity, possibly atelectasis. No pleural effusion or pneumothorax. The heart is normal in size. IMPRESSION: Mild residual left basilar opacity, possibly atelectasis. Electronically Signed   By: Julian Hy M.D.   On: 04/21/2019 10:14    EKG: Independently reviewed.  Sinus rhythm at 91 beats per  Assessment/Plan Fall at home: Patient had a mechanical fall this a.m. while trying to use the restroom.  Suspecting this could be secondary to weakness patient reported decreased p.o. intake. -Admit to a medical telemetry bed -PT/OT to evaluate and treat  Acute metabolic encephalopathy: Patient has been dealing with confusion now for several months, but had been acutely worse.  Previously thought to be possibly related with her medications which have been being tapered down.  Unclear at this time the exact cause of symptoms including medication, recent Covid virus infection, and/or dehydration. -Neurochecks -Check MRI brain without contrast -Follow-up EEG study  Follow-up lamotrigine level -Appreciate neurology consultative service  Acute kidney injury superimposed chronic kidney disease: Patient baseline creatinine previously noted to be 2.1 on 1/26, but has been  trending up and currently 2.71 with BUN 17. -Continue sodium bicarb -Normal saline IV fluids at 75 mL/h -Recheck creatinine in a.m.  Leukocytosis: Acute.  WBC elevated up to 17.8 and previously had been 12.2 on 2/1.  Urinalysis was negative for any signs of infection hest x-ray noted left lower lung basilar opacity possibly atelectasis.  Patient has had signs of cough. -Check pro-calcitonin, ESR, CRP -Recheck CBC in a.m.  COVID-19 infection: Patient was diagnosed with Covid on 1/23.  Patient no longer having symptoms of diarrhea or fever but still has a mild cough.  Chest x-ray noting left basilar opacity.  Question possibility of a bacterial infection.  Bipolar disorder with anxiety and depression : Home medications include Ingrezza, lamotrigine, lithium, and sertraline.  Lithium levels appear to be within normal limits.  Pharmacy noted multiple medications which can cause disorientation/confusion including Ingrezza, lamotrigine, lithium, and sertraline. -May consider discussing with psychiatry in a.m. in regards to medication adjustment -Continue Ativan as needed   Parkinsonism: Patient reportedly previously diagnosed with Parkinson's disease and was on medication for period time.  However, those medications had been discontinued and it appears symptoms were thought more so medication induced.  Hypothyroidism  -Check TSH -Continue levothyroxine    DVT prophylaxis: Heparin Code Status: Full Family Communication: Discussed this plan of care with the patient husband who is present at bedside Disposition Plan: TBD Consults called: Neurology Admission status: Inpatient Norval Morton MD [facility] Pager [phone]   If 7PM-7AM, please contact night-coverage [email or web address] Password [facility]  04/21/2019, 9:55 AM

## 2019-04-21 NOTE — Evaluation (Signed)
Clinical/Bedside Swallow Evaluation Patient Details  Name: Tiffany [name] MRN: [id number] Date of Birth: Oct 28, 1950  Today's Date: 04/21/2019 Time: SLP Start Time (ACUTE ONLY): 1340 SLP Stop Time (ACUTE ONLY): 1415 SLP Time Calculation (min) (ACUTE ONLY): 35 min  Past Medical History:  Past Medical History:  Diagnosis Date  . Bipolar 1 disorder ([place])   . Depression 1987  . Parkinson's disease ([place]) 2012  . Psoriatic arthritis ([place])   . PTSD (post-traumatic stress disorder)    Past Surgical History:  Past Surgical History:  Procedure Laterality Date  . ABDOMINAL HYSTERECTOMY  1987  . CHOLECYSTECTOMY  1979  . DILATION AND CURETTAGE OF UTERUS  1973  . MASTECTOMY Bilateral 09/19/2014  . TONSILLECTOMY  1970  . URETERAL REIMPLANTION Bilateral 1974   HPI:  Tiffany [name] is a 69 y.o. female with medical history significant of hypothyroidism, bipolar 1 disorder, psoriatic arthritis, COVID-19 positive on 1/23.  She presents after having a fall at home, unable to get up and family notes worsening confusion.  RN reported coughing when medications were consumed with thin liquids.  MRI on 04/21/19 reported: " No evidence of acute intracranial abnormality. 3. 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 4. Mild-to-moderate generalized parenchymal atrophy and chronic small vessel ischemic disease."   Assessment / Plan / Recommendation Clinical Impression  Pt was seen for a bedside swallow evaluation and she presents with oral dysphagia.  Per chart review, pt had a coughing episode in the ED when consuming medications whole in puree.  Pt's husband provided her hx and he stated that pt generally does not have any difficulty with PO consumption if she is sitting upright.  He additionally stated that she has had increased tremors for the past few months and increased difficulty with communicating thoughts/needs in the past few weeks following COVID + diagnosis.  Oral mech exam was  remarkable for generalized oral weakness.  Pt consumed trials of ice chips, thin liquid, and regular solids.  She exhibited prolonged mastication of regular solids with decreased lingual manipulation and bolus formation, resulting in residue on the pt's lingual surface.   Pt cleared residue with a cued liquid wash, but she was unable to verbalize if she had sensed the residue independently.  No clinical s/sx of aspiration were observed with any trials.  Pt's husband also stated that she consumes softer solids at baseline.  Therefore, recommend Dysphagia 3 (mech soft) solids and thin liquids with meds whole in puree and full supervision to assist with self-feeding (secondary to tremors) and to cue for the following compensatory strategies: 1) Small bites/sips 2) Sit upright as possible 3) Slow rate of intake.  SLP will f/u to monitor diet tolerance.    SLP Visit Diagnosis: Dysphagia, oral phase (R13.11)    Aspiration Risk  Mild aspiration risk    Diet Recommendation Dysphagia 3 (Mech soft);Thin liquid   Liquid Administration via: Cup;Straw Medication Administration: Whole meds with puree Supervision: Staff to assist with self feeding;Full supervision/cueing for compensatory strategies Compensations: Minimize environmental distractions;Slow rate;Small sips/bites Postural Changes: Seated upright at 90 degrees    Other  Recommendations Oral Care Recommendations: Oral care BID   Follow up Recommendations (TBD)      Frequency and Duration min 2x/week  2 weeks       Prognosis Prognosis for Safe Diet Advancement: Fair Barriers to Reach Goals: Language deficits      Swallow Study   General Date of Onset: 04/21/19 HPI: Tiffany [name] is a 69 y.o.  female with medical history significant of hypothyroidism, bipolar 1 disorder, psoriatic arthritis, COVID-19 positive on 1/23.  She presents after having a fall at home, unable to get up and family notes worsening confusion.  RN reported coughing when  medications were consumed with thin liquids.   Type of Study: Bedside Swallow Evaluation Previous Swallow Assessment: None  Diet Prior to this Study: NPO Temperature Spikes Noted: Yes Respiratory Status: Room air History of Recent Intubation: No Behavior/Cognition: Alert;Confused;Distractible;Requires cueing Oral Cavity Assessment: Dry Oral Care Completed by SLP: No Oral Cavity - Dentition: Adequate natural dentition Vision: Functional for self-feeding Self-Feeding Abilities: Needs assist(secondary to tremors ) Patient Positioning: Upright in bed Baseline Vocal Quality: Normal Volitional Swallow: Unable to elicit    Oral/Motor/Sensory Function Overall Oral Motor/Sensory Function: Generalized oral weakness Facial ROM: Reduced right;Reduced left Facial Symmetry: Within Functional Limits Facial Sensation: Within Functional Limits Lingual ROM: Reduced right;Reduced left Lingual Symmetry: Within Functional Limits Lingual Strength: Reduced   Ice Chips Ice chips: Within functional limits Presentation: Spoon;Self Fed   Thin Liquid Thin Liquid: Within functional limits Presentation: Straw;Spoon;Cup;Self Fed    Nectar Thick Nectar Thick Liquid: Not tested   Honey Thick Honey Thick Liquid: Not tested   Puree Puree: (Pt refused )   Solid     Solid: Impaired Presentation: Self Fed Oral Phase Impairments: Impaired mastication Oral Phase Functional Implications: Prolonged oral transit;Impaired mastication;Oral residue     Colin Mulders M.S., CCC-SLP Acute Rehabilitation Services Office: [phone]  [place] 04/21/2019,2:41 PM

## 2019-04-21 NOTE — Progress Notes (Signed)
EEG complete - results pending.  vLTM to follow 

## 2019-04-21 NOTE — Consult Note (Signed)
Neurology Consultation Reason for Consult: Weakness Referring Physician: Alvino Chapel, N  CC: Altered mental status  History is obtained from: Husband  HPI: Tanina Barb is a 69 y.o. female with a history of bipolar disorder, drug-induced parkinsonism, PTSD, psoriatic arthritis who presents with worsening gait, confusion, tremors that had been getting worse for a few months prior to contracting Covid and subsequently have gotten worse still since contracting Covid.  She has a history of drug-induced parkinsonism which was attributed to Abilify several years ago.  This was stopped and the patient did have significant improvement.  She has been tried on multiple medications including benztropine, trihexyphenidyl, Sinemet, selegiline, Requip, Azilect and never had much benefit.  He describes her current confusion as waxing/waning, seem to be worse at night.  She has developed tar dive dyskinesia and has been started on Ingrezza.  Her husband notes that she does not have much benefit from this, and in fact he states that she missed a week of it because of insurance issues and he did not notice much difference.  She has been on lithium for about 20 years and has had intermittent episodes of toxicity associated with decreased p.o. intake.  She fell, and due to her worsening with Covid, they brought her into the emergency department to ensure she had not had Covid associated stroke.    ROS: A 14 point ROS was performed and is negative except as noted in the HPI.   Past Medical History:  Diagnosis Date  . Bipolar 1 disorder ([place])   . Depression 1987  . Parkinson's disease ([place]) 2012  . Psoriatic arthritis ([place])   . PTSD (post-traumatic stress disorder)      Family History  Problem Relation Age of Onset  . Cancer Mother   . Cancer Sister   . Stroke Maternal Grandfather   . Diabetes Paternal Grandfather   . Cancer Sister      Social History:  reports that she has never smoked. She has  never used smokeless tobacco. She reports that she does not drink alcohol or use drugs.   Exam: Current vital signs: BP (!) 153/69 (BP Location: Right Leg)   Pulse 78   Temp 99 F (37.2 C) (Oral)   Resp 16   Ht '5\' 4"'$  (1.626 m)   Wt 90.3 kg   SpO2 100%   BMI 34.16 kg/m  Vital signs in last 24 hours: Temp:  [99 F (37.2 C)-99.1 F (37.3 C)] 99 F (37.2 C) (02/10 1328) Pulse Rate:  [78-96] 78 (02/10 1328) Resp:  [16] 16 (02/10 0900) BP: (139-183)/(69-95) 153/69 (02/10 1328) SpO2:  [94 %-100 %] 100 % (02/10 1328) Weight:  [90.3 kg] 90.3 kg (02/10 0700)   Physical Exam  Constitutional: Appears well-developed and well-nourished.  Psych: Affect appropriate to situation Eyes: No scleral injection HENT: No OP obstrucion MSK: no joint deformities.  Cardiovascular: Normal rate and regular rhythm.  Respiratory: Effort normal, non-labored breathing GI: Soft.  No distension. There is no tenderness.  Skin: WDI  Neuro: Mental Status: Patient is awake, alert, oriented she is not oriented to month or year.  She has significant perseveration, both motor and verbal.  World backwards is "W-A-R-A-R-A-R" Cranial Nerves: II: Visual Fields are full. Pupils are equal, round, and reactive to light.   III,IV, VI: She has saccadic pursuit, and she has some perseveration with fixation. V: Facial sensation is symmetric to temperature VII: Facial movement is symmetric.  VIII: hearing is intact to voice X: Uvula elevates symmetrically XI:  Shoulder shrug is symmetric. XII: tongue is midline without atrophy or fasciculations.  Motor  5/5 strength was present in all four extremities, though she has poor effort in her lower extremities bilaterally.  She does have some cogwheeling, but her tone is not markedly elevated. Sensory: Sensation is symmetric to light touch and temperature in the arms and legs. Deep Tendon Reflexes: 3+ and symmetric in the biceps and patellae.  Cerebellar: She has marked  postural, intentional tremor as well as some resting component.    I have reviewed labs in epic and the results pertinent to this consultation are: Cr 2.7, close to baseline(was 2.4 6 months ago) LFTs normal.  Lithium 0.79 UA - no uti ESR 30, CRP 1.7 TSH 0.095 PCT 0.2  I have reviewed the images obtained: MRI brain-chronic ischemic changes, I am not certain of the cortically based infarct, favor chronic small vessel ischemic change.  Impression: 69 year old woman with extensive small vessel ischemic change, long history of psychiatric medication use, who presents with worsening gait, confusion.  The perseveration and waxing/waning character do make me want to further evaluate for seizure disorder and I would favor starting her on long-term EEG to try and capture some of the episodes of worsening.  If this is negative overnight, then I think seizure would be much less likely.  Chronic lithium toxicity does not necessarily correlate with levels and I am concerned that she may be having some contribution from this.  Another possibility would be that her previously diagnosed parkinsonism was actually idiopathic exacerbated by medication use rather than true drug-induced.  Her husband indicates that he would prefer seeing a different neurologist.  I think that a movement disorder subspecialist examination would be beneficial and I will make a referral as an outpatient.  Finally, I think that the worsening in the setting of Covid is not surprising.  Encephalopathy and worsening of underlying neurological conditions have been seen and reported widely.  Recovery from this is likely to be fairly prolonged, in terms of weeks and months rather than days.  Lamotrigine toxicity could be another contributing factor and a level should be checked.  Her chronic ischemic cerebral disease I do think merits antiplatelet therapy for secondary stroke prevention.  She also need her cholesterol controlled by her  primary care physician.  No acute lesion to merit work-up at this time.  Recommendations: 1) lamotrigine level 2) overnight EEG 3) I have requested outpatient follow up with [facility] 4) ASA 94m   MRoland Rack MD [facility] [phone] If 7pm- 7am, please page neurology on call as listed in [place]

## 2019-04-21 NOTE — ED Provider Notes (Addendum)
[facility] EMERGENCY DEPARTMENT Provider Note   CSN: [id number] Arrival date & time: 04/21/19  8119     History Chief Complaint  Patient presents with  . Fall    Tiffany [name] is a 69 y.o. female. Level 5 caveat due to altered mental status. HPI Patient presents with fall and generalized weakness.  Has been going for least the last week with a generalized weakness and confusion.  Seen in the ER recently and found to have some presumed dehydration.  Head CT at that time.  Did have Covid around 3 weeks ago.  Has had weakness since then.  Has had worsening tremors.  History of medication use Parkinson's disease and they have been decreasing her sertraline.  No fevers.  Has had more confusion also.  States she fell today and was unable to get up.  You should be able to get up with some assistance but was unable to do that today.  Patient identifies her husband who is with her as her son.  Her actual son is a physician with [employer].    Past Medical History:  Diagnosis Date  . Bipolar 1 disorder ([place])   . Depression 1987  . Parkinson's disease ([place]) 2012  . Psoriatic arthritis ([place])   . PTSD (post-traumatic stress disorder)     Patient Active Problem List   Diagnosis Date Noted  . Thrombocytopenia ([place]) 09/04/2018  . Vitamin D deficiency 09/04/2018  . Psoriatic arthritis ([place]) 09/04/2018  . History of colonic polyps 05/28/2017  . 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 Malignant neoplasm of overlapping sites of left breast in female, estrogen receptor positive ([place]) 03/14/2016  . Chronic kidney disease, stage III (moderate) 01/19/2016  . Tardive dyskinesia 10/18/2015  . HX: breast cancer 12/27/2014  . Osteopenia determined by x-ray 10/31/2014  . Rosacea 05/21/2007  . Bipolar affective disorder, mixed ([place]) 08/16/2004  . Acquired hypothyroidism 04/03/1996    Past Surgical History:  Procedure Laterality Date  .  ABDOMINAL HYSTERECTOMY  1987  . CHOLECYSTECTOMY  1979  . DILATION AND CURETTAGE OF UTERUS  1973  . MASTECTOMY Bilateral 09/19/2014  . TONSILLECTOMY  1970  . URETERAL REIMPLANTION Bilateral 1974     OB History   No obstetric history on file.     Family History  Problem Relation Age of Onset  . Cancer Mother   . Cancer Sister   . Stroke Maternal Grandfather   . Diabetes Paternal Grandfather   . Cancer Sister     Social History   Tobacco Use  . Smoking status: Never Smoker  . Smokeless tobacco: Never Used  Substance Use Topics  . Alcohol use: Never  . Drug use: Never    Home Medications Prior to Admission medications   Medication Sig Start Date End Date Taking? Authorizing Provider  acetaminophen (TYLENOL) 500 MG tablet Take 500 mg by mouth daily as needed for headache (pain).     [provider]  calcitRIOL (ROCALTROL) 0.25 MCG capsule Take 0.25 mcg by mouth every Monday, Wednesday, and Friday.  06/18/18   [provider]  Certolizumab Pegol (CIMZIA [place]) Inject into the skin every 28 (twenty-eight) days. Administered by [facility] - for psoriatic arthritis    [provider]  CHOLECALCIFEROL PO Take 1-2 tablets by mouth See admin instructions. Take 1 tablet by mouth 1st day, then take 2 tablets by mouth 2nd day, then repeat    [provider]  clonazePAM (KLONOPIN) 0.5 MG  tablet Take 1 to 2 tablets at bedtime. Patient taking differently: Take 0.5 mg by mouth at bedtime.  01/04/19   Mozingo, Berdie Ogren, NP  Cyanocobalamin (VITAMIN B-12 PO) Take 1 tablet by mouth daily with breakfast.    [provider]  INGREZZA 40 MG CAPS Take 1 capsule by mouth at bedtime. Patient taking differently: Take 40 mg by mouth at bedtime.  03/30/19   Mozingo, Berdie Ogren, NP  lamoTRIgine (LAMICTAL) 100 MG tablet Take three tablets daily. Patient taking differently: Take 300 mg by mouth at bedtime. Take three tablets daily. 01/04/19    Mozingo, Berdie Ogren, NP  levothyroxine (SYNTHROID) 125 MCG tablet Take 125 mcg by mouth daily before breakfast.  08/24/18   [provider]  lithium carbonate (LITHOBID) 300 MG CR tablet Take 300 mg by mouth at bedtime.  08/21/18   [provider]  LORazepam (ATIVAN) 0.5 MG tablet Take 1 tablet (0.5 mg total) by mouth daily as needed. Patient taking differently: Take 0.5 mg by mouth daily as needed for anxiety.  01/04/19   Mozingo, Berdie Ogren, NP  sertraline (ZOLOFT) 25 MG tablet Take 2 tablets (50 mg total) by mouth daily. Patient taking differently: Take 50 mg by mouth daily with breakfast.  01/04/19   Mozingo, Berdie Ogren, NP  sodium bicarbonate 650 MG tablet Take 650 mg by mouth 2 (two) times daily.    [provider]    Allergies    Aripiprazole, Methotrexate, Amoxicillin-pot clavulanate, Cefdinir, Etanercept, Exemestane, Fluoxetine, Methylprednisolone sodium succ, Epinephrine, and Nitrofurantoin  Review of Systems   Review of Systems  Unable to perform ROS: Mental status change    Physical Exam Updated Vital Signs BP (!) 160/84   Pulse 83   Temp 99.1 F (37.3 C) (Oral)   Resp 16   Ht 5\' 4"  (1.626 m)   Wt 90.3 kg   SpO2 98%   BMI 34.16 kg/m   Physical Exam Vitals and nursing note reviewed.  HENT:     Head: Atraumatic.  Eyes:     Extraocular Movements: Extraocular movements intact.     Pupils: Pupils are equal, round, and reactive to light.  Cardiovascular:     Rate and Rhythm: Regular rhythm.  Pulmonary:     Breath sounds: No wheezing or rhonchi.  Abdominal:     Tenderness: There is no abdominal tenderness.  Musculoskeletal:     Cervical back: Neck supple.     Right lower leg: Edema present.     Left lower leg: Edema present.  Skin:    General: Skin is warm.     Capillary Refill: Capillary refill takes less than 2 seconds.  Neurological:     Mental Status: She is alert.     Comments: Awake and pleasant but some  confusion.  Does appear to have a tremor.  Identified her husband as her son.  When asked to do finger-nose on the right side she attempted to take her finger towards my nose.  On the left side however she did do finger to finger but did miss the finger.  Has good strength with straight leg raise bilaterally.     ED Results / Procedures / Treatments   Labs (all labs ordered are listed, but only abnormal results are displayed) Labs Reviewed  COMPREHENSIVE METABOLIC PANEL - Abnormal; Notable for the following components:      Result Value   Glucose, Bld 107 (*)    Creatinine, Ser 2.71 (*)    GFR calc non  Af Amer 17 (*)    GFR calc Af Amer 20 (*)    All other components within normal limits  CBC WITH DIFFERENTIAL/PLATELET - Abnormal; Notable for the following components:   WBC 17.8 (*)    Neutro Abs 15.0 (*)    Monocytes Absolute 1.2 (*)    All other components within normal limits  LITHIUM LEVEL  URINALYSIS, ROUTINE W REFLEX MICROSCOPIC    EKG None  Radiology No results found.  Procedures Procedures (including critical care time)  Medications Ordered in ED Medications - No data to display  ED Course  I have reviewed the triage vital signs and the nursing notes.  Pertinent labs & imaging results that were available during my care of the patient were reviewed by me and considered in my medical decision making (see chart for details).    MDM Rules/Calculators/A&P                      Patient with generalized weakness confusion.  Had had some issues before Covid but had Covid around 3 weeks ago.  Has had increasing tremors.  Increasing confusion.  Had a fall today where she was just generally weak and unable to get up.  Normally would be able to get up with pulling on something or with assistance.  Initially unable to identify her husband.  Reviewed labs from today and from visit 9 days ago.  Does have chronic kidney disease.  Think would benefit from further work-up this is an  inpatient.  Cannot ambulate at this time.  She is now able to identify her husband but takes a while to be able to do it.  Will discuss with neurology and hospitalist.  Patient has been greater than 10 days since diagnosis of Covid.  No fevers.  Does not need further testing and appears cleared from a Covid standpoint at this time. Final Clinical Impression(s) / ED Diagnoses Final diagnoses:  Encephalopathy  Weakness    Rx / DC Orders ED Discharge Orders    None       Davonna Belling, MD 04/21/19 5170    Davonna Belling, MD 04/21/19 [phone]

## 2019-04-21 NOTE — Progress Notes (Signed)
vLTM started  Neurology notified   RN instructed on use of event button  Event button tested at start of vLTM

## 2019-04-21 NOTE — ED Notes (Signed)
Pt drinking water without difficulty prior to ativan admin. Pt choking as swallowing ativan and water. Dr. Tamala Julian made aware, SLP eval ordered.

## 2019-04-21 NOTE — Procedures (Signed)
Patient Name: Tiffany [name]  MRN: [id number]  Epilepsy Attending: Lora Havens  Referring Physician/Provider: Dr Roland Rack Date: 04/21/2019  Duration: 21.08 mins  Patient history: 69yo F with drug induced parkinsonism, long history of lithium use and prior COVID infection presented with waxing and waning mental status. EEG to evaluate for seizure  Level of alertness: awake, asleep  AEDs during EEG study: Clonazepam  Technical aspects: This EEG study was done with scalp electrodes positioned according to the 10-20 International system of electrode placement. Electrical activity was acquired at a sampling rate of 500Hz  and reviewed with a high frequency filter of 70Hz  and a low frequency filter of 1Hz . EEG data were recorded continuously and digitally stored.   DESCRIPTION: During awake state, no clear posterior dominant rhythm was seen. Sleep was characterized by vertex waves and generalized 3-5hz  theta-delta slowing. EEG also showed continuous generalized 3-6Hz  theta-delta slowing as well as lef t temporo- parietal sharp transients. Hyperventilation and photic stimulation were not performed.  ABNORMALITY - Continuous slow, generalized   IMPRESSION: This study is suggestive of moderate diffuse encephalopathy, non specific to etiology. No seizures or definite epileptiform discharges were seen throughout the recording.  Tiffany [name]

## 2019-04-21 NOTE — ED Triage Notes (Signed)
Pt BIB [facility] for unwitnessed fall around 0200-0300. Pt is communicatively impaired at baseline, husband to arrive to ED shortly. Pt was diagnosed w COVID @ 1/19, family reports that pt has been "deteriorating since" regarding mobility and stability. Pt has restricted L arm, minor BLE abrasions, but denies significant pain anywhere

## 2019-04-21 NOTE — Progress Notes (Signed)
[place] for review medications for induced effect Indication: Presenting for acute encephalopathy and weakness  Allergies  Allergen Reactions  . Aripiprazole Other (See Comments)    Parkinsonism     . Methotrexate Other (See Comments)    Hair loss, severe stomatitis    . Amoxicillin-Pot Clavulanate Other (See Comments)    Unknown reaction   . Cefdinir Other (See Comments)    Other reaction(s): Diarrhea Yeast infection and fever; negative c diff  . Etanercept Other (See Comments)    Headaches    . Exemestane Other (See Comments)    Suicidal thoughts with medication    . Fluoxetine Other (See Comments)    Parkinsonism  . Methylprednisolone Sodium Succ Other (See Comments)    Agitated mania  . Epinephrine Palpitations    tachycardia   . Nitrofurantoin Nausea And Vomiting and Rash         Patient Measurements: Height: 5' 4"  (162.6 cm) Weight: 199 lb (90.3 kg) IBW/kg (Calculated) : 54.7  Vital Signs: Temp: 99.1 F (37.3 C) (02/10 0658) Temp Source: Oral (02/10 0658) BP: 183/90 (02/10 1015) Pulse Rate: 96 (02/10 1015) Intake/Output from previous day: No intake/output data recorded. Intake/Output from this shift: Total I/O In: -  Out: 14 [Urine:14]  Labs: Recent Labs    04/21/19 0728  WBC 17.8*  HGB 13.7  HCT 44.5  PLT 215  CREATININE 2.71*  ALBUMIN 3.9  PROT 7.2  AST 27  ALT 21  ALKPHOS 103  BILITOT 1.2   Estimated Creatinine Clearance: 21 mL/min (A) (by C-G formula based on SCr of 2.71 mg/dL (H)).   Microbiology: No results found for this or any previous visit (from the past 720 hour(s)).  Medications:  Current Facility-Administered Medications  Medication Dose Route Frequency Provider Last Rate Last Admin  . 0.9 %  sodium chloride infusion   Intravenous Continuous Smith, Rondell A, MD      . acetaminophen (TYLENOL) tablet 650 mg  650 mg Oral Q6H PRN Norval Morton, MD       Or  .  acetaminophen (TYLENOL) suppository 650 mg  650 mg Rectal Q6H PRN Smith, Rondell A, MD      . albuterol (PROVENTIL) (2.5 MG/3ML) 0.083% nebulizer solution 2.5 mg  2.5 mg Nebulization Q6H PRN Tamala Julian, Rondell A, MD      . clonazePAM (KLONOPIN) tablet 0.5 mg  0.5 mg Oral QHS Smith, Rondell A, MD      . heparin injection 5,000 Units  5,000 Units Subcutaneous Q8H Smith, Rondell A, MD      . Derrill Memo ON 04/22/2019] levothyroxine (SYNTHROID) tablet 125 mcg  125 mcg Oral QAC breakfast Smith, Rondell A, MD      . lidocaine (LIDODERM) 5 % 1 patch  1 patch Transdermal Daily PRN Smith, Rondell A, MD      . LORazepam (ATIVAN) tablet 0.5 mg  0.5 mg Oral Daily PRN Fuller Plan A, MD      . ondansetron (ZOFRAN) tablet 4 mg  4 mg Oral Q6H PRN Fuller Plan A, MD       Or  . ondansetron (ZOFRAN) injection 4 mg  4 mg Intravenous Q6H PRN Smith, Rondell A, MD      . sodium chloride flush (NS) 0.9 % injection 3 mL  3 mL Intravenous Q12H Smith, Rondell A, MD   3 mL at 04/21/19 1058  . [START ON 04/22/2019] vitamin B-12 (CYANOCOBALAMIN) tablet 1,000 mcg  1,000 mcg Oral Q breakfast  Norval Morton, MD       Current Outpatient Medications  Medication Sig Dispense Refill Last Dose  . acetaminophen (TYLENOL) 500 MG tablet Take 500 mg by mouth daily as needed for headache (pain).    Past Month at Unknown time  . AZELAIC ACID EX Apply 1 application topically daily. 15% gel, apply to face.   04/20/2019 at Unknown time  . calcitRIOL (ROCALTROL) 0.25 MCG capsule Take 0.25 mcg by mouth every Monday, Wednesday, and Friday.    Past Week at Unknown time  . Certolizumab Pegol (CIMZIA) 2 X 200 MG KIT Inject 200 mg into the skin every 14 (fourteen) days. Administered by [facility] - for psoriatic arthritis    Past Month at Unknown time  . Cholecalciferol 50 MCG (2000 UT) TABS Take 1-2 tablets by mouth See admin instructions. Take 1 tablet by mouth 1st day, then take 2 tablets by mouth 2nd day, then repeat   04/20/2019 at Unknown  time  . clonazePAM (KLONOPIN) 0.5 MG tablet Take 1 to 2 tablets at bedtime. (Patient taking differently: Take 0.5-1 mg by mouth at bedtime. ) 60 tablet 2 04/20/2019 at Unknown time  . INGREZZA 40 MG CAPS Take 1 capsule by mouth at bedtime. (Patient taking differently: Take 40 mg by mouth at bedtime. ) 30 capsule 2 04/20/2019 at Unknown time  . lamoTRIgine (LAMICTAL) 100 MG tablet Take three tablets daily. (Patient taking differently: Take 300 mg by mouth at bedtime. Take three tablets daily.) 270 tablet 1 04/20/2019 at Unknown time  . levothyroxine (SYNTHROID) 125 MCG tablet Take 125 mcg by mouth daily before breakfast.    04/20/2019 at Unknown time  . Lidocaine 4 % PTCH Apply 1 patch topically See admin instructions. Apply up to 3 times daily as needed for pain.   Past Month at Unknown time  . lithium carbonate (LITHOBID) 300 MG CR tablet Take 300 mg by mouth at bedtime.    04/20/2019 at Unknown time  . LORazepam (ATIVAN) 0.5 MG tablet Take 1 tablet (0.5 mg total) by mouth daily as needed. (Patient taking differently: Take 0.5 mg by mouth daily as needed for anxiety. ) 30 tablet 2 Past Month at Unknown time  . sertraline (ZOLOFT) 25 MG tablet Take 2 tablets (50 mg total) by mouth daily. (Patient taking differently: Take 37.5 mg by mouth daily with breakfast. ) 180 tablet 1 04/20/2019 at Unknown time  . sodium bicarbonate 650 MG tablet Take 650 mg by mouth 2 (two) times daily.   04/20/2019 at Unknown time  . vitamin B-12 (CYANOCOBALAMIN) 1000 MCG tablet Take 1 tablet by mouth daily with breakfast.    04/20/2019 at Unknown time     Assessment: 69 yo F presenting to ED with fall and generalized weakness and confusion. PMH significant for recent COVID-19 infection, bipolar 1 disorder, depression, Parkinson's disease, psoriatic arthritis, and PTSD. PTA medications listed above, several of which have known CNS depression adverse effects including drowsiness, sedation, confusion, etc. Notable medications that could be  contributing to acute encephalopathy:   Clonazepam (taken yesterday)  Drowsiness 50%  Ingrezza (taken yesterday)  Drowsiness/sedation 11%  Lamotrigine (taken yesterday)  Confusion, dyspraxia (1-5%)  Lithium (taken yesterday)  Abnormal EEG, drowsiness, disorientation  Lorazepam (taken within past month)  Sertraline   Drowsiness (11%), abnormal gait, confusion   Plan: Discuss with MD   Vertis Kelch, PharmD, [facility] PGY2 Cardiology Pharmacy Resident Phone [phone] 04/21/2019       11:15 AM  Please check [email or web address] for  unit-specific pharmacist phone numbers

## 2019-04-21 NOTE — ED Notes (Signed)
Pt transported to MRI 

## 2019-04-22 ENCOUNTER — Ambulatory Visit: Payer: Medicare Other | Admitting: Psychology

## 2019-04-22 DIAGNOSIS — G40009 Localization-related (focal) (partial) idiopathic epilepsy and epileptic syndromes with seizures of localized onset, not intractable, without status epilepticus: Secondary | ICD-10-CM

## 2019-04-22 LAB — BASIC METABOLIC PANEL
Anion gap: 11 (ref 5–15)
BUN: 16 mg/dL (ref 8–23)
CO2: 20 mmol/L — ABNORMAL LOW (ref 22–32)
Calcium: 9.3 mg/dL (ref 8.9–10.3)
Chloride: 111 mmol/L (ref 98–111)
Creatinine, Ser: 2.18 mg/dL — ABNORMAL HIGH (ref 0.44–1.00)
GFR calc Af Amer: 26 mL/min — ABNORMAL LOW (ref >60)
GFR calc non Af Amer: 22 mL/min — ABNORMAL LOW (ref >60)
Glucose, Bld: 103 mg/dL — ABNORMAL HIGH (ref 70–99)
Potassium: 3.2 mmol/L — ABNORMAL LOW (ref 3.5–5.1)
Sodium: 142 mmol/L (ref 135–145)

## 2019-04-22 LAB — CBC
HCT: 41.1 % (ref 36.0–46.0)
Hemoglobin: 12.9 g/dL (ref 12.0–15.0)
MCH: 29.9 pg (ref 26.0–34.0)
MCHC: 31.4 g/dL (ref 30.0–36.0)
MCV: 95.4 fL (ref 80.0–100.0)
Platelets: 181 10*3/uL (ref 150–400)
RBC: 4.31 MIL/uL (ref 3.87–5.11)
RDW: 14 % (ref 11.5–15.5)
WBC: 11.4 10*3/uL — ABNORMAL HIGH (ref 4.0–10.5)
nRBC: 0 % (ref 0.0–0.2)

## 2019-04-22 LAB — LAMOTRIGINE LEVEL: Lamotrigine Lvl: 15.3 ug/mL (ref 2.0–20.0)

## 2019-04-22 LAB — T4, FREE: Free T4: 1.43 ng/dL — ABNORMAL HIGH (ref 0.61–1.12)

## 2019-04-22 LAB — VITAMIN B12: Vitamin B-12: 1565 pg/mL — ABNORMAL HIGH (ref 180–914)

## 2019-04-22 MED ORDER — POTASSIUM CHLORIDE 10 MEQ/100ML IV SOLN
10.0000 meq | INTRAVENOUS | Status: AC
  Administered 2019-04-22 (×2): 10 meq via INTRAVENOUS
  Filled 2019-04-22 (×2): qty 100

## 2019-04-22 MED ORDER — LEVETIRACETAM IN NACL 500 MG/100ML IV SOLN
500.0000 mg | INTRAVENOUS | Status: AC
  Administered 2019-04-22: 17:00:00 500 mg via INTRAVENOUS
  Filled 2019-04-22: qty 100

## 2019-04-22 MED ORDER — HYDRALAZINE HCL 10 MG PO TABS
10.0000 mg | ORAL_TABLET | Freq: Three times a day (TID) | ORAL | Status: DC | PRN
  Administered 2019-04-23 – 2019-04-25 (×2): 10 mg via ORAL
  Filled 2019-04-22 (×2): qty 1

## 2019-04-22 MED ORDER — AZELAIC ACID 15 % EX GEL: Freq: Every day | CUTANEOUS | Status: DC

## 2019-04-22 MED ORDER — POTASSIUM CHLORIDE 20 MEQ/15ML (10%) PO SOLN
40.0000 meq | Freq: Once | ORAL | Status: AC
  Administered 2019-04-22: 40 meq via ORAL
  Filled 2019-04-22: qty 30

## 2019-04-22 MED ORDER — LEVETIRACETAM 250 MG PO TABS
250.0000 mg | ORAL_TABLET | Freq: Two times a day (BID) | ORAL | Status: DC
  Administered 2019-04-22 – 2019-04-26 (×8): 250 mg via ORAL
  Filled 2019-04-22 (×8): qty 1

## 2019-04-22 MED ORDER — NYSTATIN 100000 UNIT/ML MT SUSP
5.0000 mL | Freq: Four times a day (QID) | OROMUCOSAL | Status: DC
  Administered 2019-04-22 – 2019-04-26 (×15): 500000 [IU] via ORAL
  Filled 2019-04-22 (×15): qty 5

## 2019-04-22 NOTE — Progress Notes (Signed)
Pt refused to swallow PO potassium and PO synthroid. Baltazar Najjar informed.

## 2019-04-22 NOTE — Progress Notes (Signed)
OT Cancellation Note  Patient Details Name: Marcena Dias MRN: [id number] DOB: [birth year]   Cancelled Treatment:    Reason Eval/Treat Not Completed: Patient at procedure or test/ unavailable(Continuous EEG)  [place] MSOT, OTR/L Acute Rehab Pager: [phone] Office: [phone] 04/22/2019, 10:04 AM

## 2019-04-22 NOTE — Procedures (Addendum)
Patient Name: Tiffany [name]  MRN: [id number]  Epilepsy Attending: Lora Havens  Referring Physician/Provider: Dr Roland Rack Duration: 04/21/2019 1905 to 04/22/2019 1905  Patient history: 69yo F with drug induced parkinsonism, long history of lithium use and prior COVID infection presented with waxing and waning mental status. EEG to evaluate for seizure  Level of alertness: awake, asleep  AEDs during EEG study: Clonazepam  Technical aspects: This EEG study was done with scalp electrodes positioned according to the 10-20 International system of electrode placement. Electrical activity was acquired at a sampling rate of 500Hz  and reviewed with a high frequency filter of 70Hz  and a low frequency filter of 1Hz . EEG data were recorded continuously and digitally stored.   DESCRIPTION: During awake state, no clear posterior dominant rhythm was seen. Sleep was characterized by vertex waves and generalized 3-5hz  theta-delta slowing. EEG also showed continuous generalized 3-6Hz  theta-delta slowing, at times with triphasic morphology.  Sharp waves were seen in left temporoparietal region, maximal P7/P3.  These sharp waves are at time periodic, at 0.5-1 Hz and rhythmic without clear evolution, lasting 10 to 15 seconds and without any clinical change.  Hyperventilation and photic stimulation were not performed.  ABNORMALITY -Sharp waves, left temporoparietal region -Continuous slow, generalized   IMPRESSION: This study is suggestive of epileptogenicity arising from left temporoparietal region.  The sharp waves at times appeared rhythmic and periodic without definite clinical changes.  Additionally there is evidence of mild moderate diffuse encephalopathy, non specific to etiology. No clear seizures were seen throughout the recording.    Lothar Prehn Barbra Sarks

## 2019-04-22 NOTE — Progress Notes (Signed)
PROGRESS NOTE    Tiffany [name]  FYB:[id number] DOB: [birth year] DOA: 04/21/2019 PCP: Leamon Arnt, MD   Brief Narrative: 69 year old with past medical history significant for hypothyroidism, bipolar, psoriatic arthritis, COVID-19 positive on 1/23.  She presents after having a fall the morning of admission, patient was not able to get up and family notes worsening confusion.  At baseline patient is able to ambulate without need of assistance and utilize a walker when traveling outside for long distances.  She is able to recognize family members, but get confused very frequently.  Her sertraline dose has been decreased over a week ago.  Patient was admitted with worsening confusion, gait disturbance and worsening chronic tremors, evaluation in the ED show white blood cell at 17 creatinine 2.7 lithium level 0.79, chest x-ray mild residual left basilar opacity thought to be atelectasis.  Assessment & Plan:   Principal Problem:   Acute metabolic encephalopathy Active Problems:   Acquired hypothyroidism   Bipolar affective disorder, mixed ([place])   Acute kidney injury superimposed on chronic kidney disease ([place])   Leukocytosis   Fall at home, initial encounter   Anxiety and depression   NIDPO-24  1-Acute metabolic encephalopathy;  Confusion, gait disturbance, worsening chronic tremors; -Might be related to medications (Lamotrigine level pending) - , Dehydration. Recent covid infection ?  -No significant evidence for infection: chest x ray with atelectasis, Leukocytosis almost resolved. UA negative for infection.  -TSH low and free T 4 elevated. Hold synthroid for now. Await Free T 3. Elevated thyroid function could explain worsening tremors.  -Lithium level was normal. No hypercalcemia, no increase BUN.  -Discussed with Neurology, continue to hold  Lithium,  Lamotrigine, Sertraline and ingrezza.  -MRI negative for stroke. B 12 elevated. Ammonia normal.  -EEG showed generalized 3-6 HZ theta  delta slowing and left temporal parietal sharp transient   Hypothyroidism;  On synthroid.  TSH low at 0.095. Free T 4 elevated at 1.4  Hold synthroid.  Await Free T 3.  Might need to adjust synthroid dose and hold it for several days depending on Free T3 level.   AKI; on CKD stage stage IV Improved with IV fluids.  Cr decreased to 2.2 baseline.  Continue with sodium bicarb tablet.   Fall; mechanical fall, suspect related to weakness and tremors.  PT, OT evaluation.   Leukocytosis;  WBC has decreased to 11. Pro-calcitonin 0.20 afebrile.  UA negative. ESR mildly elevated at 30./  Chest x ray atelectasis.  No indication for antibiotics.  Blood culture no growth to date.   Bipolar, anxiety depression;  On Ingrezza, Lamotrigine, sertraline and lithium.  Discussed with Dr. Hortense Ramal continue to hold lithium, sertraline, lamotrigine and Ingrezza.  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  Dr Tobias Alexander is recommending patient to follow with movement disorder neurology specialist.   Oral thrush; start nystatin.  Elevated BP; monitor. PRN hydralazine ordered.   Estimated body mass index is 34.16 kg/m as calculated from the following:   Height as of this encounter: '5\' 4"'$  (1.626 m).   Weight as of this encounter: 90.3 kg.   DVT prophylaxis: Heparin  Code Status: Full code Family Communication: Care discussed with husband.  Disposition Plan:  Patient is from: Home  Anticipated d/c date: Home in 1 or 2 days.  Barriers to d/c or necessity for inpatient status: plan to continue with EEG for another 24 hours, continue to monitor off multiples medications. Patient is not back to baseline.   Consultants:   Neurology  Procedures:   EEG  Antimicrobials:  none  Subjective: Patient likes to keep her eyes close, she is feeling tired, does not feels well. She was able to recognized her husband who was at bedside. She was able to tell me that  she was at [facility]. She couldn't tell me her age. She denies cough, chest pain or dyspnea.    Objective: Vitals:   04/21/19 1947 04/21/19 1948 04/21/19 2346 04/22/19 0415  BP: (!) 153/88 (!) 153/88 (!) 179/86 (!) 166/77  Pulse: 74 80 71 76  Resp: 18 18 18    Temp: 98.4 F (36.9 C) 98.5 F (36.9 C) 98.4 F (36.9 C) 97.9 F (36.6 C)  TempSrc: Oral Oral Oral Oral  SpO2: 95% 97% 98% 99%  Weight:      Height:        Intake/Output Summary (Last 24 hours) at 04/22/2019 0849 Last data filed at 04/22/2019 0414 Gross per 24 hour  Intake 375 ml  Output 414 ml  Net -39 ml   Filed Weights   04/21/19 0700  Weight: 90.3 kg    Examination:  General exam: Appears calm and comfortable  Respiratory system: Clear to auscultation. Respiratory effort normal. Cardiovascular system: S1 & S2 heard, RRR.  Gastrointestinal system: Abdomen is nondistended, soft and nontender. No organomegaly or masses felt. Normal bowel sounds heard. Central nervous system: Alert and oriented times 2, follows some command,  Extremities: Symmetric 5 x 5 power. Skin: mild redness, skin abrasion  right knee after fall.     Data Reviewed: I have personally reviewed following labs and imaging studies  CBC: Recent Labs  Lab 04/21/19 0728 04/22/19 0258  WBC 17.8* 11.4*  NEUTROABS 15.0*  --   HGB 13.7 12.9  HCT 44.5 41.1  MCV 95.9 95.4  PLT 215 537   Basic Metabolic Panel: Recent Labs  Lab 04/21/19 0728 04/22/19 0258  NA 142 142  K 3.5 3.2*  CL 107 111  CO2 24 20*  GLUCOSE 107* 103*  BUN 17 16  CREATININE 2.71* 2.18*  CALCIUM 10.0 9.3   GFR: Estimated Creatinine Clearance: 26.1 mL/min (A) (by C-G formula based on SCr of 2.18 mg/dL (H)). Liver Function Tests: Recent Labs  Lab 04/21/19 0728  AST 27  ALT 21  ALKPHOS 103  BILITOT 1.2  PROT 7.2  ALBUMIN 3.9   No results for input(s): LIPASE, AMYLASE in the last 168 hours. Recent Labs  Lab 04/21/19 1435  AMMONIA 25   Coagulation Profile: No results  for input(s): INR, PROTIME in the last 168 hours. Cardiac Enzymes: No results for input(s): CKTOTAL, CKMB, CKMBINDEX, TROPONINI in the last 168 hours. BNP (last 3 results) No results for input(s): PROBNP in the last 8760 hours. HbA1C: No results for input(s): HGBA1C in the last 72 hours. CBG: No results for input(s): GLUCAP in the last 168 hours. Lipid Profile: No results for input(s): CHOL, HDL, LDLCALC, TRIG, CHOLHDL, LDLDIRECT in the last 72 hours. Thyroid Function Tests: Recent Labs    04/21/19 1056  TSH 0.095*   Anemia Panel: No results for input(s): VITAMINB12, FOLATE, FERRITIN, TIBC, IRON, RETICCTPCT in the last 72 hours. Sepsis Labs: Recent Labs  Lab 04/21/19 1057  PROCALCITON 0.20    No results found for this or any previous visit (from the past 240 hour(s)).       Radiology Studies: MR BRAIN WO CONTRAST  Result Date: 04/21/2019 CLINICAL DATA:  Encephalopathy. Additional history provided: Fall and generalized weakness, altered mental status, history of  Parkinson's disease EXAM: MRI HEAD WITHOUT CONTRAST TECHNIQUE: Multiplanar, multiecho pulse sequences of the brain and surrounding structures were obtained without intravenous contrast. COMPARISON:  Head CT 04/12/2019 FINDINGS: Brain: Motion degraded examination. Most notably there is mild-to-moderate motion degradation of the axial T2 weighted sequence, moderate/severe motion degradation of the sagittal T1 weighted sequence and moderate/severe motion degradation of the coronal T2 weighted sequence. There is no evidence of acute infarct. No evidence of intracranial mass. No midline shift or extra-axial fluid collection. No chronic intracranial blood products. 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 Mild-to-moderate scattered and confluent T2/FLAIR hyperintensity within the cerebral white matter is nonspecific, but consistent with chronic small vessel ischemic  disease. Mild-to-moderate generalized parenchymal atrophy. Mineralization within the bilateral basal ganglia, midbrain and dentate nuclei. Vascular: Flow voids maintained within the proximal large arterial vessels. Skull and upper cervical spine: Within the limitations of motion degradation, no focal marrow lesion is identified. Incompletely assessed upper cervical spondylosis. Sinuses/Orbits: Visualized orbits demonstrate no acute abnormality. Minimal ethmoid and maxillary sinus mucosal thickening. No significant mastoid effusion. IMPRESSION: 1. Motion degraded examination as described. 2. No evidence of acute intracranial abnormality. 3. 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 4. Mild-to-moderate generalized parenchymal atrophy and chronic small vessel ischemic disease. Electronically Signed   By: Kellie Simmering DO   On: 04/21/2019 12:22   DG Chest Portable 1 View  Result Date: 04/21/2019 CLINICAL DATA:  Weakness, recent COVID EXAM: PORTABLE CHEST 1 VIEW COMPARISON:  04/06/2019 FINDINGS: Lungs are essentially clear. Mild residual left basilar opacity, possibly atelectasis. No pleural effusion or pneumothorax. The heart is normal in size. IMPRESSION: Mild residual left basilar opacity, possibly atelectasis. Electronically Signed   By: Julian Hy M.D.   On: 04/21/2019 10:14        Scheduled Meds:  aspirin EC  81 mg Oral Daily   calcitRIOL  0.25 mcg Oral Q M,W,F   clonazePAM  0.5 mg Oral QHS   heparin  5,000 Units Subcutaneous Q8H   sodium bicarbonate  650 mg Oral BID   sodium chloride flush  3 mL Intravenous Q12H   vitamin B-12  1,000 mcg Oral Q breakfast   Continuous Infusions:  sodium chloride 75 mL/hr at 04/21/19 2202   potassium chloride 10 mEq (04/22/19 0818)     LOS: 1 day    Time spent: 35 minutes    Rubie Ficco A Neilson Oehlert, MD [facility]   If 7PM-7AM, please contact night-coverage [email or web address]  04/22/2019, 8:49 AM

## 2019-04-22 NOTE — Progress Notes (Signed)
LTM maint complete - no skin breakdown under: FP1, FP2, A2 pressed on P7,O1

## 2019-04-22 NOTE — Plan of Care (Signed)

## 2019-04-22 NOTE — Progress Notes (Signed)
Subjective: No clinical seizure-like episodes.  Husband states she appears better than yesterday as in she is having some periods iIntermittently where she is "lucid".  Patient states she is feeling better but is unable to elaborate any further.  ROS: negative except above  Examination  Vital signs in last 24 hours: Temp:  [97.9 F (36.6 C)-98.5 F (36.9 C)] 97.9 F (36.6 C) (02/11 0415) Pulse Rate:  [71-80] 76 (02/11 0415) Resp:  [18] 18 (02/10 2346) BP: (140-179)/(70-88) 166/77 (02/11 0415) SpO2:  [95 %-100 %] 99 % (02/11 0415)  General: lying in bed, not in apparent distress  CVS: pulse-normal rate and rhythm RS: breathing comfortably, clear to auscultation Extremities: normal, warm  Neuro: MS: Alert, oriented to self and place, not to time, follows simple one-step commands only, has word finding difficulties and attention deficit (not able to tell days of the week backwards, subtract 100-7) CN: pupils equal and reactive,  EOMI, face symmetric, tongue midline, normal sensation over face, Motor: 5/5 strength in all 4 extremities  Basic Metabolic Panel: Recent Labs  Lab 04/21/19 0728 04/22/19 0258  NA 142 142  K 3.5 3.2*  CL 107 111  CO2 24 20*  GLUCOSE 107* 103*  BUN 17 16  CREATININE 2.71* 2.18*  CALCIUM 10.0 9.3    CBC: Recent Labs  Lab 04/21/19 0728 04/22/19 0258  WBC 17.8* 11.4*  NEUTROABS 15.0*  --   HGB 13.7 12.9  HCT 44.5 41.1  MCV 95.9 95.4  PLT 215 181     Coagulation Studies: No results for input(s): LABPROT, INR in the last 72 hours.  Imaging  MRI brain without contrast 04/21/2019: No evidence of acute intracranial abnormality. 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 Mild-to-moderate generalized parenchymal atrophy and chronic small vessel ischemic disease.  ASSESSMENT AND PLAN: 69 year old female with extensive small vessel ischemic disease, long history of psychiatric medication use who presented with  worsening gait, confusion and word finding difficulty.  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  We are holding off lamotrigine at this point pending levels. -Due to evidence of sharp waves in EEG, consider multiple antiepileptic drugs.  Patient already on lamotrigine therefore would like to avoid oxcarbazepine (similar mechanism of action), consider Depakote due to mood stabilizing properties however patient already has tremor at baseline and Depakote can worsen tremor.  There is a small risk of worsening psychiatric comorbidities due to Keppra use however would like to start Keppra due to minimal interactions with other medications -Discussed with patient's husband who reports that patient is very sensitive to medications.  Therefore will start IV Keppra 500 mg once followed up 250 mg twice daily, also adjusted for renal dysfunction -Continue LTM EEG for another 24 hours to assess for any further seizures, change in EEG after initiating Keppra -We will consider resuming lamotrigine at home dose depending on levels -Held lithium on admission due to concern for chronic lithium toxicity, recommend psychiatry consult tomorrow to assess and recommend another medication for psychiatric disorder if needed -Continue management of other comorbidities per primary team -Seizure precautions -As needed IV  Ativan 1 mg only for generalized tonic-clonic seizure  lasting more than 2 minutes or focal seizure lasting more than 5 minutes -Delirium precautions, fall precautions  Thank you for allowing [place] to participate in the care of this patient.  Neurology will continue to follow.  Please page neuro hospitalist for any further questions after 5 PM.   I have spent a total of   35 minutes with the patient reviewing hospital notes,  test results, labs and examining the patient as well as establishing an assessment and plan that was discussed personally with the patient, her husband, medicine team and nurse.  > 50% of time was spent in direct patient care.    Ariani Seier Barbra Sarks

## 2019-04-22 NOTE — Progress Notes (Signed)
PT Cancellation Note  Patient Details Name: Tiffany [name] MRN: [id number] DOB: 24-Sep-1950   Cancelled Treatment:    Reason Eval/Treat Not Completed: Patient at procedure or test/unavailable (EEG).    Wyona Almas, PT, DPT Acute Rehabilitation Services Pager [phone] Office [phone]    Deno Etienne 04/22/2019, 10:02 AM

## 2019-04-22 NOTE — Progress Notes (Signed)
Pt able to swallow PO synthroid at 0659, but still needs potassium supplement. Dr. Tyrell Antonio updated.

## 2019-04-22 NOTE — Progress Notes (Signed)
Potassium 3.2, Kirby informed.

## 2019-04-22 NOTE — Progress Notes (Signed)
SLP Cancellation Note  Patient Details Name: Tiffany [name] MRN: [id number] DOB: 05-07-1950   Cancelled treatment:       Reason Eval/Treat Not Completed: Patient at procedure or test/unavailable EEG.  ST will re-attempt as schedule allows.  Marina Goodell, M.Ed., CCC-SLP Speech Therapy Acute Rehabilitation [phone]: Acute Rehab office [phone] - pager    Marina Goodell 04/22/2019, 10:01 AM

## 2019-04-23 LAB — CBC
HCT: 40.3 % (ref 36.0–46.0)
Hemoglobin: 12.6 g/dL (ref 12.0–15.0)
MCH: 30 pg (ref 26.0–34.0)
MCHC: 31.3 g/dL (ref 30.0–36.0)
MCV: 96 fL (ref 80.0–100.0)
Platelets: 144 10*3/uL — ABNORMAL LOW (ref 150–400)
RBC: 4.2 MIL/uL (ref 3.87–5.11)
RDW: 14.3 % (ref 11.5–15.5)
WBC: 11.7 10*3/uL — ABNORMAL HIGH (ref 4.0–10.5)
nRBC: 0 % (ref 0.0–0.2)

## 2019-04-23 LAB — BASIC METABOLIC PANEL
Anion gap: 11 (ref 5–15)
BUN: 15 mg/dL (ref 8–23)
CO2: 20 mmol/L — ABNORMAL LOW (ref 22–32)
Calcium: 9.2 mg/dL (ref 8.9–10.3)
Chloride: 115 mmol/L — ABNORMAL HIGH (ref 98–111)
Creatinine, Ser: 1.86 mg/dL — ABNORMAL HIGH (ref 0.44–1.00)
GFR calc Af Amer: 31 mL/min — ABNORMAL LOW (ref >60)
GFR calc non Af Amer: 27 mL/min — ABNORMAL LOW (ref >60)
Glucose, Bld: 92 mg/dL (ref 70–99)
Potassium: 4.4 mmol/L (ref 3.5–5.1)
Sodium: 146 mmol/L — ABNORMAL HIGH (ref 135–145)

## 2019-04-23 LAB — T3, FREE: T3, Free: 2.4 pg/mL (ref 2.0–4.4)

## 2019-04-23 MED ORDER — PERPHENAZINE 2 MG PO TABS
2.0000 mg | ORAL_TABLET | Freq: Every day | ORAL | Status: DC
  Filled 2019-04-23: qty 1

## 2019-04-23 MED ORDER — SODIUM BICARBONATE 650 MG PO TABS
650.0000 mg | ORAL_TABLET | Freq: Every day | ORAL | Status: DC
  Administered 2019-04-24 – 2019-04-25 (×2): 650 mg via ORAL
  Filled 2019-04-23 (×2): qty 1

## 2019-04-23 MED ORDER — LORAZEPAM 1 MG PO TABS: 1.0000 mg | ORAL_TABLET | ORAL | Status: DC | PRN

## 2019-04-23 MED ORDER — POLYETHYLENE GLYCOL 3350 17 G PO PACK
17.0000 g | PACK | Freq: Every day | ORAL | Status: DC
  Administered 2019-04-23: 17 g via ORAL
  Filled 2019-04-23 (×3): qty 1

## 2019-04-23 MED ORDER — LEVOTHYROXINE SODIUM 112 MCG PO TABS
112.0000 ug | ORAL_TABLET | Freq: Every day | ORAL | Status: DC
  Administered 2019-04-24 – 2019-04-25 (×2): 112 ug via ORAL
  Filled 2019-04-23 (×2): qty 1

## 2019-04-23 MED ORDER — LAMOTRIGINE 100 MG PO TABS
100.0000 mg | ORAL_TABLET | Freq: Every day | ORAL | Status: DC
  Administered 2019-04-23 – 2019-04-25 (×3): 100 mg via ORAL
  Filled 2019-04-23 (×3): qty 1

## 2019-04-23 MED ORDER — DEXTROSE 5 % IV SOLN: INTRAVENOUS | Status: DC

## 2019-04-23 MED ORDER — SERTRALINE HCL 50 MG PO TABS
25.0000 mg | ORAL_TABLET | Freq: Every day | ORAL | Status: DC
  Administered 2019-04-23 – 2019-04-24 (×2): 25 mg via ORAL
  Filled 2019-04-23 (×4): qty 1

## 2019-04-23 MED ORDER — LORAZEPAM 2 MG/ML IJ SOLN: 1.0000 mg | Freq: Four times a day (QID) | INTRAMUSCULAR | Status: DC | PRN

## 2019-04-23 NOTE — Progress Notes (Signed)
LTM EEG discontinued - pt had abrasion from fp2 electrode along w mild pinkness under fp1.

## 2019-04-23 NOTE — Progress Notes (Signed)
LTM maint complete - no skin breakdown under:  F3,F4  pressed CZ

## 2019-04-23 NOTE — Progress Notes (Addendum)
Subjective: No acute events overnight.  Patient states she is feeling significantly better today.  States she walked with physical therapy.  Patient's husband at bedside also states patient's mental status has continued to improve.  Denies any side effects after initiating Keppra yesterday.   ROS: negative except above  Examination  Vital signs in last 24 hours: Temp:  [97.9 F (36.6 C)-98.7 F (37.1 C)] 98.7 F (37.1 C) (02/12 1118) Pulse Rate:  [54-63] 58 (02/12 1118) Resp:  [17-19] 18 (02/12 1118) BP: (140-184)/(59-84) 151/66 (02/12 1118) SpO2:  [97 %-100 %] 99 % (02/12 1118)  General: Sitting in recliner, not in apparent distress CVS: pulse-normal rate and rhythm RS: breathing comfortably Extremities: normal   Neuro: MS: Alert, oriented, follows commands CN: pupils equal and reactive,  EOMI, face symmetric, tongue midline Motor: Antigravity strength in all 4 extremities  Basic Metabolic Panel: Recent Labs  Lab 04/21/19 0728 04/22/19 0258 04/23/19 0308  NA 142 142 146*  K 3.5 3.2* 4.4  CL 107 111 115*  CO2 24 20* 20*  GLUCOSE 107* 103* 92  BUN 17 16 15   CREATININE 2.71* 2.18* 1.86*  CALCIUM 10.0 9.3 9.2    CBC: Recent Labs  Lab 04/21/19 0728 04/22/19 0258 04/23/19 0308  WBC 17.8* 11.4* 11.7*  NEUTROABS 15.0*  --   --   HGB 13.7 12.9 12.6  HCT 44.5 41.1 40.3  MCV 95.9 95.4 96.0  PLT 215 181 144*     Coagulation Studies: No results for input(s): LABPROT, INR in the last 72 hours.  Imaging MRI brain without contrast 04/21/2019: No evidence of acute intracranial abnormality. 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 Mild-to-moderate generalized parenchymal atrophy and chronic small vessel ischemic disease.  ASSESSMENT AND PLAN: 69 year old female with extensive small vessel ischemic disease, long history of psychiatric medication use who presented with worsening gait, confusion and word finding difficulty.  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  Also concern that patient may be developing dementia. -LTM EEG showed left temporoparietal sharp waves  Recommendations -Patient was on lamotrigine 300 mg daily, could not tolerate a higher dose per husband due to significant dizziness.  Lamotrigine level 15.3.  Will start at lower dose of Lamotrigine 100 mg QHS, was used for mood stabilization. -Due to evidence of sharp waves in EEG, consider multiple antiepileptic drugs.  Patient already on lamotrigine therefore would like to avoid oxcarbazepine (similar mechanism of action), consider Depakote due to mood stabilizing properties however patient already has tremor at baseline and Depakote can worsen tremor.  There is a small risk of worsening psychiatric comorbidities due to Keppra use however was started on low-dose Keppra 250 mg twice daily without any side effects.  Can be increased to 500 mg twice daily if needed - DC LTM EEG as patient continues to improve -Held lithium on admission due to concern for chronic lithium toxicity, recommend psychiatry consult to assess and recommend another medication for psychiatric disorder if needed -Continue management of other comorbidities per primary team -Seizure precautions, do not drive after discharge -As needed IV Ativan 1 mg only for generalized tonic-clonic seizure lasting more than 2 minutes or focal seizure lasting more than 5 minutes -Delirium precautions, fall precautions,  continue PT/OT -Patient will need follow-up with Dr. Wells Guiles Tat for movement disorder asap after  discharge. Will also likely benefit from cognitive and neuropsych evaluation.  Thank you for allowing [place] to participate in the care of this patient.  Neurology will continue to follow.  Please page neuro hospitalist for any further questions after 5 PM.  I have spent a total of35 minuteswith the patient reviewing hospitalnotes,  test results, labs and examining the patient as well as establishing an assessment and plan that was discussed personally with the patient, her husband, medicine team and nurse.>50% of time was spent in direct patient care.

## 2019-04-23 NOTE — Progress Notes (Signed)
SLP Cancellation Note  Patient Details Name: Ariba Lehnen MRN: [id number] DOB: 1950/10/04   Cancelled treatment:       Reason Eval/Treat Not Completed: Patient at procedure or test/unavailable.  SLE order received and acknowledged.  Pt was unavailable at this time.  SLP will re-attempt later today as schedule allows.     Elvia Collum Leeland Lovelady 04/23/2019, 10:23 AM

## 2019-04-23 NOTE — Consult Note (Addendum)
Telepsych Consultation   Reason for Consult:  Medications reccommendation Referring Physician:  Internal Medicine  Location of Patient:  [id number] Location of Provider: [facility]  Patient Identification: Tiffany [name] MRN:  [id number] Principal Diagnosis: Acute metabolic encephalopathy Diagnosis:  Principal Problem:   Acute metabolic encephalopathy Active Problems:   Acquired hypothyroidism   Bipolar affective disorder, mixed ([place])   Acute kidney injury superimposed on chronic kidney disease ([place])   Leukocytosis   Fall at home, initial encounter   Anxiety and depression   COVID-19   Total Time spent with patient: 15 minutes  Subjective:   Tiffany [name] is a 69 y.o. female was seen and evaluated via teleassessment.  She is awake alert and oriented x3.  Patient's husband is at bedside [facility] patient provided verbal authorization for her husband to stay during assessment.  She denies suicidal or homicidal ideations.  Denies auditory visual hallucinations.  Reports she was initially admitted due to a fall.  States she is followed by [facility] for medication management with her current diagnosis of bipolar disorder, depression and anxiety.  Reports she is prescribed Lamictal, lithium and Zoloft.  Reports taking and tolerating medications well.  Chart reviewed lithium level 0.79 and Lamictal 15.3.  Husband reports patient's confusion started roughly 2 or 3 months prior with her confusion getting progressively worse.  Reports new onset of imbalance.  Patient reports taking Ingrezza for tremors and EPS symptoms. During this assessment.  Patient reports patient appears to be feeling and doing a lot better than on admission 3 days prior.  He reports concerns with unsteady gait and worsening concentration/confusions.  States Rubye his primary care provider advised patient to follow-up for neurology consult prior to her fall.  Reports her thyroid was recently tested  denied medication adjustments.  Husband denied any medical concerns at this time.  Case staffed with attending psychiatrist Dwyane Dee.  See medication adjustments below.  Support encouragement reassurance was provided.  HPI: Per admission assessment: 69 y.o. female with medical history significant of hypothyroidism, bipolar 1 disorder, psoriatic arthritis, COVID-19 positive on 1/23.  She presents after having a fall this morning at home unable to get up and family notes worsening confusion.  MRI showing no acute findings.  Past Psychiatric History: Bipolar , depression  Risk to Self:   Risk to Others:   Prior Inpatient Therapy:   Prior Outpatient Therapy:    Past Medical History:  Past Medical History:  Diagnosis Date  . Bipolar 1 disorder ([place])   . Depression 1987  . Parkinson's disease ([place]) 2012  . Psoriatic arthritis ([place])   . PTSD (post-traumatic stress disorder)     Past Surgical History:  Procedure Laterality Date  . ABDOMINAL HYSTERECTOMY  1987  . CHOLECYSTECTOMY  1979  . DILATION AND CURETTAGE OF UTERUS  1973  . MASTECTOMY Bilateral 09/19/2014  . TONSILLECTOMY  1970  . URETERAL REIMPLANTION Bilateral 1974   Family History:  Family History  Problem Relation Age of Onset  . Cancer Mother   . Cancer Sister   . Stroke Maternal Grandfather   . Diabetes Paternal Grandfather   . Cancer Sister    Family Psychiatric  History:  Social History:  Social History   Substance and Sexual Activity  Alcohol Use Never     Social History   Substance and Sexual Activity  Drug Use Never    Social History   Socioeconomic History  . Marital status: Married    Spouse name: Not on file  .  Number of children: Not on file  . Years of education: Not on file  . Highest education level: Not on file  Occupational History  . Not on file  Tobacco Use  . Smoking status: Never Smoker  . Smokeless tobacco: Never Used  Substance and Sexual Activity  . Alcohol use: Never  . Drug use:  Never  . Sexual activity: Not Currently  Other Topics Concern  . Not on file  Social History Narrative   Moved to area from [place] 08/2018   Social Determinants of Health   Financial Resource Strain:   . Difficulty of Paying Living Expenses: Not on file  Food Insecurity:   . Worried About Charity fundraiser in the Last Year: Not on file  . Ran Out of Food in the Last Year: Not on file  Transportation Needs:   . Lack of Transportation (Medical): Not on file  . Lack of Transportation (Non-Medical): Not on file  Physical Activity:   . Days of Exercise per Week: Not on file  . Minutes of Exercise per Session: Not on file  Stress:   . Feeling of Stress : Not on file  Social Connections:   . Frequency of Communication with Friends and Family: Not on file  . Frequency of Social Gatherings with Friends and Family: Not on file  . Attends Religious Services: Not on file  . Active Member of Clubs or Organizations: Not on file  . Attends Archivist Meetings: Not on file  . Marital Status: Not on file   Additional Social History:    Allergies:   Allergies  Allergen Reactions  . Aripiprazole Other (See Comments)    Parkinsonism     . Methotrexate Other (See Comments)    Hair loss, severe stomatitis    . Amoxicillin-Pot Clavulanate Other (See Comments)    Unknown reaction   . Cefdinir Other (See Comments)    Other reaction(s): Diarrhea Yeast infection and fever; negative c diff  . Etanercept Other (See Comments)    Headaches    . Exemestane Other (See Comments)    Suicidal thoughts with medication    . Fluoxetine Other (See Comments)    Parkinsonism  . Methylprednisolone Sodium Succ Other (See Comments)    Agitated mania  . Epinephrine Palpitations    tachycardia   . Nitrofurantoin Nausea And Vomiting and Rash         Labs:  Results for orders placed or performed during the hospital encounter of 04/21/19 (from the past 48 hour(s))  Culture, blood  (routine x 2)     Status: None (Preliminary result)   Collection Time: 04/21/19  2:35 PM   Specimen: BLOOD RIGHT HAND  Result Value Ref Range   Specimen Description BLOOD RIGHT HAND    Special Requests      BOTTLES DRAWN AEROBIC AND ANAEROBIC Blood Culture results may not be optimal due to an inadequate volume of blood received in culture bottles   Culture      NO GROWTH 2 DAYS Performed at [facility], [place] [address] [place], [place] [address]    Report Status PENDING   Ammonia     Status: None   Collection Time: 04/21/19  2:35 PM  Result Value Ref Range   Ammonia 25 9 - 35 umol/L    Comment: Performed at [facility], [place] [address] [place], [place] [address]  Lamotrigine level     Status: None   Collection Time: 04/21/19  3:30 PM  Result Value Ref Range   Lamotrigine Lvl 15.3 2.0 - 20.0 ug/mL    Comment: (NOTE)                                Detection Limit = 1.0 Performed At: [facility] [place], [name] [id number] Rush Farmer MD OE:[id number]   CBC     Status: Abnormal   Collection Time: 04/22/19  2:58 AM  Result Value Ref Range   WBC 11.4 (H) 4.0 - 10.5 K/uL   RBC 4.31 3.87 - 5.11 MIL/uL   Hemoglobin 12.9 12.0 - 15.0 g/dL   HCT 41.1 36.0 - 46.0 %   MCV 95.4 80.0 - 100.0 fL   MCH 29.9 26.0 - 34.0 pg   MCHC 31.4 30.0 - 36.0 g/dL   RDW 14.0 11.5 - 15.5 %   Platelets 181 150 - 400 K/uL   nRBC 0.0 0.0 - 0.2 %    Comment: Performed at [facility], [place] [address], [place], [place] [address]  Basic metabolic panel     Status: Abnormal   Collection Time: 04/22/19  2:58 AM  Result Value Ref Range   Sodium 142 135 - 145 mmol/L   Potassium 3.2 (L) 3.5 - 5.1 mmol/L   Chloride 111 98 - 111 mmol/L   CO2 20 (L) 22 - 32 mmol/L   Glucose, Bld 103 (H) 70 - 99 mg/dL   BUN 16 8 - 23 mg/dL   Creatinine, Ser 2.18 (H) 0.44 - 1.00 mg/dL   Calcium 9.3 8.9 - 10.3 mg/dL   GFR calc non Af Amer 22 (L) >60 mL/min   GFR calc Af Amer 26 (L)  >60 mL/min   Anion gap 11 5 - 15    Comment: Performed at [facility], [place] [address], [place], [place] [address]  T3, free     Status: None   Collection Time: 04/22/19  8:38 AM  Result Value Ref Range   T3, Free 2.4 2.0 - 4.4 pg/mL    Comment: (NOTE) Performed At: [facility] [place], [name] [id number] Rush Farmer MD PZ:[id number]   T4, free     Status: Abnormal   Collection Time: 04/22/19  8:38 AM  Result Value Ref Range   Free T4 1.43 (H) 0.61 - 1.12 ng/dL    Comment: (NOTE) Biotin ingestion may interfere with free T4 tests. If the results are inconsistent with the TSH level, previous test results, or the clinical presentation, then consider biotin interference. If needed, order repeat testing after stopping biotin. Performed at [facility], [place] [address] [place], [place] [address]   Vitamin B12     Status: Abnormal   Collection Time: 04/22/19  8:38 AM  Result Value Ref Range   Vitamin B-12 1,565 (H) 180 - 914 pg/mL    Comment: (NOTE) This assay is not validated for testing neonatal or myeloproliferative syndrome specimens for Vitamin B12 levels. Performed at [facility], [place] [address], [place] [address]   CBC     Status: Abnormal   Collection Time: 04/23/19  3:08 AM  Result Value Ref Range   WBC 11.7 (H) 4.0 - 10.5 K/uL   RBC 4.20 3.87 - 5.11 MIL/uL   Hemoglobin 12.6 12.0 - 15.0 g/dL   HCT 40.3 36.0 - 46.0 %   MCV 96.0 80.0 - 100.0 fL  MCH 30.0 26.0 - 34.0 pg   MCHC 31.3 30.0 - 36.0 g/dL   RDW 14.3 11.5 - 15.5 %   Platelets 144 (L) 150 - 400 K/uL   nRBC 0.0 0.0 - 0.2 %    Comment: Performed at [facility], [place] [address] [place], [place] [address]  Basic metabolic panel     Status: Abnormal   Collection Time: 04/23/19  3:08 AM  Result Value Ref Range   Sodium 146 (H) 135 - 145 mmol/L   Potassium 4.4 3.5 - 5.1 mmol/L   Chloride 115 (H) 98 - 111 mmol/L   CO2 20 (L) 22 - 32 mmol/L   Glucose,  Bld 92 70 - 99 mg/dL   BUN 15 8 - 23 mg/dL   Creatinine, Ser 1.86 (H) 0.44 - 1.00 mg/dL   Calcium 9.2 8.9 - 10.3 mg/dL   GFR calc non Af Amer 27 (L) >60 mL/min   GFR calc Af Amer 31 (L) >60 mL/min   Anion gap 11 5 - 15    Comment: Performed at [place] [address] [place], [place] [address]    Medications:  Current Facility-Administered Medications  Medication Dose Route Frequency Provider Last Rate Last Admin  . acetaminophen (TYLENOL) tablet 650 mg  650 mg Oral Q6H PRN Norval Morton, MD       Or  . acetaminophen (TYLENOL) suppository 650 mg  650 mg Rectal Q6H PRN Smith, Rondell A, MD      . albuterol (PROVENTIL) (2.5 MG/3ML) 0.083% nebulizer solution 2.5 mg  2.5 mg Nebulization Q6H PRN Fuller Plan A, MD      . aspirin EC tablet 81 mg  81 mg Oral Daily Greta Doom, MD   81 mg at 04/23/19 0900  . Azelaic Acid 15 %   Apply externally Daily Regalado, Belkys A, MD      . calcitRIOL (ROCALTROL) capsule 0.25 mcg  0.25 mcg Oral Q M,W,F Smith, Rondell A, MD   0.25 mcg at 04/23/19 0859  . clonazePAM (KLONOPIN) tablet 0.5 mg  0.5 mg Oral QHS Smith, Rondell A, MD   0.5 mg at 04/22/19 2208  . dextrose 5 % solution   Intravenous Continuous Regalado, Belkys A, MD 50 mL/hr at 04/23/19 0651 New Bag at 04/23/19 0651  . heparin injection 5,000 Units  5,000 Units Subcutaneous Q8H Smith, Rondell A, MD   5,000 Units at 04/23/19 0545  . hydrALAZINE (APRESOLINE) tablet 10 mg  10 mg Oral Q8H PRN Regalado, Belkys A, MD   10 mg at 04/23/19 0439  . levETIRAcetam (KEPPRA) tablet 250 mg  250 mg Oral BID Lora Havens, MD   250 mg at 04/23/19 0901  . lidocaine (LIDODERM) 5 % 1 patch  1 patch Transdermal Daily PRN Smith, Rondell A, MD      . LORazepam (ATIVAN) tablet 0.5 mg  0.5 mg Oral Daily PRN Smith, Rondell A, MD      . nystatin (MYCOSTATIN) 100000 UNIT/ML suspension 500,000 Units  5 mL Oral QID Regalado, Belkys A, MD   500,000 Units at 04/23/19 0859  . ondansetron (ZOFRAN)  tablet 4 mg  4 mg Oral Q6H PRN Fuller Plan A, MD       Or  . ondansetron (ZOFRAN) injection 4 mg  4 mg Intravenous Q6H PRN Smith, Rondell A, MD      . polyethylene glycol (MIRALAX / GLYCOLAX) packet 17 g  17 g Oral Daily Regalado, Belkys A, MD  17 g at 04/23/19 0901  . [START ON 04/24/2019] sodium bicarbonate tablet 650 mg  650 mg Oral Daily Regalado, Belkys A, MD      . sodium chloride flush (NS) 0.9 % injection 3 mL  3 mL Intravenous Q12H Fuller Plan A, MD   3 mL at 04/23/19 0914    Musculoskeletal:  Psychiatric Specialty Exam: Physical Exam  Neurological: She is alert.  Psychiatric: She has a normal mood and affect. Her behavior is normal.    Review of Systems  Blood pressure (!) 151/66, pulse (!) 58, temperature 98.7 F (37.1 C), temperature source Axillary, resp. rate 18, height 5\' 4"  (1.626 m), weight 90.3 kg, SpO2 99 %.Body mass index is 34.16 kg/m.  General Appearance: Casual  Eye Contact:  Good  Speech:  Clear and Coherent  Volume:  Normal  Mood:  Anxious  Affect:  Congruent  Thought Process:  Coherent  Orientation:  Full (Time, Place, and Person)  Thought Content:  Logical  Suicidal Thoughts:  No  Homicidal Thoughts:  No  Memory:  Immediate;   Fair Recent;   Fair  Judgement:  Fair  Insight:  Fair  Psychomotor Activity:  NA  Concentration:  Concentration: Fair  Recall:  [place] of Knowledge:  Good  Language:  Fair  Akathisia:  No  Handed:  Right  AIMS (if indicated):     Assets:  Communication Skills Desire for Improvement Resilience Social Support  ADL's:  Intact  Cognition:  WNL  Sleep:        Treatment Plan Summary: Daily contact with patient to assess and evaluate symptoms and progress in treatment and Medication management   -Case staffed with attending psychiatrist Guerry Bruin  Discontinue lithium 300 mg p.o. daily  Consider decreasing  Lamictal 100 mg to 50 mg p.o. twice daily Continue/restart Zoloft 25 mg p.o. daily -May use low dose  Perphenazine 2 mg po QHS.   Thank you for contacting [facility]  Disposition: No evidence of imminent risk to self or others at present.   Patient does not meet criteria for psychiatric inpatient admission. Supportive therapy provided about ongoing stressors. Refer to IOP. Discussed crisis plan, support from social network, calling 911, coming to the Emergency Department, and calling Suicide Hotline.  This service was provided via telemedicine using a 2-way, interactive audio and video technology.  Names of all persons participating in this telemedicine service and their role in this encounter. Name: [name] Godette  Role: patient  Name: Farley Ly Smyre Role: husband  Name: T.Tamura Lasky  Role: np       Derrill [place], NP 04/23/2019 1:52 PM

## 2019-04-23 NOTE — Progress Notes (Signed)
PROGRESS NOTE    Tiffany [name]  DBZ:[id number] DOB: 1950/08/17 DOA: 04/21/2019 PCP: Leamon Arnt, MD   Brief Narrative: 69 year old with past medical history significant for hypothyroidism, bipolar, psoriatic arthritis, COVID-19 positive on 1/23.  She presents after having a fall the morning of admission, patient was not able to get up and family notes worsening confusion.  At baseline patient is able to ambulate without need of assistance and utilize a walker when traveling outside for long distances.  She is able to recognize family members, but get confused very frequently.  Her sertraline dose has been decreased over a week ago.  Patient was admitted with worsening confusion, gait disturbance and worsening chronic tremors, evaluation in the ED show white blood cell at 17 creatinine 2.7 lithium level 0.79, chest x-ray mild residual left basilar opacity thought to be atelectasis.  Assessment & Plan:   Principal Problem:   Acute metabolic encephalopathy Active Problems:   Acquired hypothyroidism   Bipolar affective disorder, mixed (Tiffany [name])   Acute kidney injury superimposed on chronic kidney disease (Tiffany [name])   Leukocytosis   Fall at home, initial encounter   Anxiety and depression   [id number]  1-Acute metabolic encephalopathy;  Confusion, gait disturbance, worsening chronic tremors; -Might be related to medications (Lamotrigine level pending) - , Dehydration. Recent covid infection ?  -No significant evidence for infection: chest x ray with atelectasis, Leukocytosis almost resolved. UA negative for infection.  -TSH low and free T 4 elevated. Hold synthroid for now. Await Free T 3. Elevated thyroid function could explain worsening tremors.  -Lithium level was normal. No hypercalcemia, no increase BUN.  -Discussed with Neurology, continue to hold  Lithium,  Lamotrigine, Sertraline and ingrezza.  -MRI negative for stroke. B 12 elevated. Ammonia normal.  -EEG showed generalized 3-6 HZ theta  delta slowing and left temporal parietal sharp transient. Started on Keppra.  Patient has improved some, not back to baseline.  Plan to continue with keppra. Neurology is planning to resume lamotrigine lower dose.  Awaiting Psych evaluation in regards Lithium recommendations.   Hypothyroidism;  On synthroid.  TSH low at 0.095. Free T 4 elevated at 1.4  Free T 3 normal.  Plan to resume lower dose synthroid, 112 mcg daily. Starting tomorrow.   AKI; on CKD stage stage IV Improved with IV fluids. Change fluids to D 5 due to hypernatremia.  Cr decreased to 2.2 baseline.  Will change sodium bicarb tablet to daily for now due to hypernatremia.   Mild hypernatremia; change IV fluids to D 5.    Fall; mechanical fall, suspect related to weakness and tremors.  PT, OT evaluation.   Leukocytosis; trending down.  WBC has decreased to 11. Pro-calcitonin 0.20 afebrile.  UA negative. ESR mildly elevated at 30./  Chest x ray atelectasis.  No indication for antibiotics.  Blood culture no growth to date.   Bipolar, anxiety depression;  On Ingrezza, Lamotrigine, sertraline and lithium.  Discussed with Dr. Hortense Ramal on 2-12; awaiting for psych recommendation in regards lithium. Can we stop lithium indefinitely. What other medication we can use  When to resume sertraline   Plan to resume lower dose Lamotrigine.  Psych consultation for medications recommendations.   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  Dr Tobias Alexander is recommending patient to follow with movement disorder neurology specialist.   Oral thrush; started  nystatin.  Elevated BP; monitor. PRN hydralazine ordered.  B 12 supplement. B 12 level elevated. Will give holiday off B 12 supplement. .  Mild  thrombocytopenia; monitor.   Estimated body mass index is 34.16 kg/m as calculated from the following:   Height as of this encounter: 5' 4"  (1.626 m).   Weight as of this encounter: 90.3 kg.   DVT prophylaxis: Heparin   Code Status: Full code Family Communication: Care discussed with Husband.   Disposition Plan:  Patient is from: Home  Anticipated d/c date: Home in 1 or 2 days.  Barriers to d/c or necessity for inpatient status:   Consultants:   Neurology   Procedures:   EEG  Antimicrobials:  none  Subjective: Alert, she was more conversant. She was able to tell me how many grandchildren she has,. She didn't remember all their names. She is not still at baseline, but appears improved.  She denies headaches, dyspnea,. Chest pain.    Objective: Vitals:   04/23/19 0041 04/23/19 0403 04/23/19 0546 04/23/19 0751  BP: (!) 152/59 (!) 184/81 140/73 (!) 149/69  Pulse: (!) 57 61 60 (!) 54  Resp: 18 19  17   Temp: 98.7 F (37.1 C) 98.6 F (37 C)  98 F (36.7 C)  TempSrc: Oral Oral  Axillary  SpO2: 100% 98%  97%  Weight:      Height:        Intake/Output Summary (Last 24 hours) at 04/23/2019 1123 Last data filed at 04/23/2019 2952 Gross per 24 hour  Intake 1715.53 ml  Output 700 ml  Net 1015.53 ml   Filed Weights   04/21/19 0700  Weight: 90.3 kg    Examination:  General exam: Alert, follows command Respiratory system: CTA Cardiovascular system: S 1, S 2  RRR Gastrointestinal system: BS present, soft, nt Central nervous system: alert and oriented 2, follows command Extremities: no edema. Skin: mild redness, skin abrasion  right knee after fall.     Data Reviewed: I have personally reviewed following labs and imaging studies  CBC: Recent Labs  Lab 04/21/19 0728 04/22/19 0258 04/23/19 0308  WBC 17.8* 11.4* 11.7*  NEUTROABS 15.0*  --   --   HGB 13.7 12.9 12.6  HCT 44.5 41.1 40.3  MCV 95.9 95.4 96.0  PLT 215 181 841*   Basic Metabolic Panel: Recent Labs  Lab 04/21/19 0728 04/22/19 0258 04/23/19 0308  NA 142 142 146*  K 3.5 3.2* 4.4  CL 107 111 115*  CO2 24 20* 20*  GLUCOSE 107* 103* 92  BUN 17 16 15   CREATININE 2.71* 2.18* 1.86*  CALCIUM 10.0 9.3 9.2    GFR: Estimated Creatinine Clearance: 30.6 mL/min (A) (by C-G formula based on SCr of 1.86 mg/dL (H)). Liver Function Tests: Recent Labs  Lab 04/21/19 0728  AST 27  ALT 21  ALKPHOS 103  BILITOT 1.2  PROT 7.2  ALBUMIN 3.9   No results for input(s): LIPASE, AMYLASE in the last 168 hours. Recent Labs  Lab 04/21/19 1435  AMMONIA 25   Coagulation Profile: No results for input(s): INR, PROTIME in the last 168 hours. Cardiac Enzymes: No results for input(s): CKTOTAL, CKMB, CKMBINDEX, TROPONINI in the last 168 hours. BNP (last 3 results) No results for input(s): PROBNP in the last 8760 hours. HbA1C: No results for input(s): HGBA1C in the last 72 hours. CBG: No results for input(s): GLUCAP in the last 168 hours. Lipid Profile: No results for input(s): CHOL, HDL, LDLCALC, TRIG, CHOLHDL, LDLDIRECT in the last 72 hours. Thyroid Function Tests: Recent Labs    04/21/19 1056 04/22/19 0838  TSH 0.095*  --   FREET4  --  1.43*  T3FREE  --  2.4   Anemia Panel: Recent Labs    04/22/19 0838  VITAMINB12 1,565*   Sepsis Labs: Recent Labs  Lab 04/21/19 1057  PROCALCITON 0.20    Recent Results (from the past 240 hour(s))  Culture, blood (routine x 2)     Status: None (Preliminary result)   Collection Time: 04/21/19 10:59 AM   Specimen: BLOOD  Result Value Ref Range Status   Specimen Description BLOOD RIGHT ANTECUBITAL  Final   Special Requests   Final    BOTTLES DRAWN AEROBIC AND ANAEROBIC Blood Culture adequate volume   Culture   Final    NO GROWTH 2 DAYS Performed at [facility], [place] [address], [place], [place] [address]    Report Status PENDING  Incomplete  Culture, blood (routine x 2)     Status: None (Preliminary result)   Collection Time: 04/21/19  2:35 PM   Specimen: BLOOD RIGHT HAND  Result Value Ref Range Status   Specimen Description BLOOD RIGHT HAND  Final   Special Requests   Final    BOTTLES DRAWN AEROBIC AND ANAEROBIC Blood Culture results may  not be optimal due to an inadequate volume of blood received in culture bottles   Culture   Final    NO GROWTH 2 DAYS Performed at [facility], [place] [address], [place], [place] [address]    Report Status PENDING  Incomplete         Radiology Studies: MR BRAIN WO CONTRAST  Result Date: 04/21/2019 CLINICAL DATA:  Encephalopathy. Additional history provided: Fall and generalized weakness, altered mental status, history of Parkinson's disease EXAM: MRI HEAD WITHOUT CONTRAST TECHNIQUE: Multiplanar, multiecho pulse sequences of the brain and surrounding structures were obtained without intravenous contrast. COMPARISON:  Head CT 04/12/2019 FINDINGS: Brain: Motion degraded examination. Most notably there is mild-to-moderate motion degradation of the axial T2 weighted sequence, moderate/severe motion degradation of the sagittal T1 weighted sequence and moderate/severe motion degradation of the coronal T2 weighted sequence. There is no evidence of acute infarct. No evidence of intracranial mass. No midline shift or extra-axial fluid collection. No chronic intracranial blood products. 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 Mild-to-moderate scattered and confluent T2/FLAIR hyperintensity within the cerebral white matter is nonspecific, but consistent with chronic small vessel ischemic disease. Mild-to-moderate generalized parenchymal atrophy. Mineralization within the bilateral basal ganglia, midbrain and dentate nuclei. Vascular: Flow voids maintained within the proximal large arterial vessels. Skull and upper cervical spine: Within the limitations of motion degradation, no focal marrow lesion is identified. Incompletely assessed upper cervical spondylosis. Sinuses/Orbits: Visualized orbits demonstrate no acute abnormality. Minimal ethmoid and maxillary sinus mucosal thickening. No significant mastoid effusion. IMPRESSION: 1. Motion degraded  examination as described. 2. No evidence of acute intracranial abnormality. 3. 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 4. Mild-to-moderate generalized parenchymal atrophy and chronic small vessel ischemic disease. Electronically Signed   By: Kellie Simmering DO   On: 04/21/2019 12:22   Overnight EEG with video  Result Date: 04/22/2019 Lora Havens, MD     04/23/2019  9:39 AM Patient Name: Tiffany [name] MRN: [id number] Epilepsy Attending: Lora Havens Referring Physician/Provider: Dr Roland Rack Duration: 04/21/2019 1905 to 04/22/2019 1905  Patient history: 69yo F with drug induced parkinsonism, long history of lithium use and prior COVID infection presented with waxing and waning mental status. EEG to evaluate for seizure  Level of alertness: awake,  asleep  AEDs during EEG study: Clonazepam  Technical aspects: This EEG study was done with scalp electrodes positioned according to the 10-20 International system of electrode placement. Electrical activity was acquired at a sampling rate of 500Hz  and reviewed with a high frequency filter of 70Hz  and a low frequency filter of 1Hz . EEG data were recorded continuously and digitally stored.  DESCRIPTION: During awake state, no clear posterior dominant rhythm was seen. Sleep was characterized by vertex waves and generalized 3-5hz  theta-delta slowing. EEG also showed continuous generalized 3-6Hz  theta-delta slowing, at times with triphasic morphology.  Sharp waves were seen in left temporoparietal region, maximal P7/P3.  These sharp waves are at time periodic, at 0.5-1 Hz and rhythmic without clear evolution, lasting 10 to 15 seconds and without any clinical change.  Hyperventilation and photic stimulation were not performed.  ABNORMALITY -Sharp waves, left temporoparietal region -Continuous slow, generalized  IMPRESSION: This study is suggestive of epileptogenicity arising from left temporoparietal region.  The sharp  waves at times appeared rhythmic and periodic without definite clinical changes.  Additionally there is evidence of mild moderate diffuse encephalopathy, non specific to etiology. No clear seizures were seen throughout the recording. Priyanka Barbra Sarks         Scheduled Meds: . aspirin EC  81 mg Oral Daily  . Azelaic Acid   Apply externally Daily  . calcitRIOL  0.25 mcg Oral Q M,W,F  . clonazePAM  0.5 mg Oral QHS  . heparin  5,000 Units Subcutaneous Q8H  . levETIRAcetam  250 mg Oral BID  . nystatin  5 mL Oral QID  . polyethylene glycol  17 g Oral Daily  . [START ON 04/24/2019] sodium bicarbonate  650 mg Oral Daily  . sodium chloride flush  3 mL Intravenous Q12H   Continuous Infusions: . dextrose 50 mL/hr at 04/23/19 0651     LOS: 2 days    Time spent: 35 minutes    Dashonda Bonneau A Keliah Harned, MD [facility]   If 7PM-7AM, please contact night-coverage [email or web address]  04/23/2019, 11:23 AM

## 2019-04-23 NOTE — Evaluation (Addendum)
Occupational Therapy Evaluation Patient Details Name: Tiffany [name] MRN: [id number] DOB: Oct 29, 1950 Today's Date: 04/23/2019    History of Present Illness 69 y.o. female with medical history significant of hypothyroidism, bipolar 1 disorder, psoriatic arthritis, COVID-19 positive on 1/23.  She presents after having a fall this morning at home unable to get up and family notes worsening confusion.  MRI showing no acute findings.   Clinical Impression   PTA, pt was living her husband and independent until recent diagnosis of COVID where she then needed increased assistance for BADLs. Pt currently requiring Min A for UB ADLs, Mod A for LB ADLs, and Mod A +2 for functional mobility with RW. Pt presenting with decreased balance, strength, coordination, and cognition. Pt motivated to participate in therapy and has good family support. Pt would benefit from intensive acute OT to facilitate safe dc. Recommend dc to CIR for intensive OT to optimize safety, independence with ADLs, and return to PLOF.      Follow Up Recommendations  CIR;Supervision/Assistance - 24 hour    Equipment Recommendations  Other (comment)(Defer to next venue)    Recommendations for Other Services PT consult;Rehab consult;Speech consult     Precautions / Restrictions Precautions Precautions: Fall Restrictions Weight Bearing Restrictions: No      Mobility Bed Mobility Overal bed mobility: Needs Assistance Bed Mobility: Supine to Sit     Supine to sit: Min assist     General bed mobility comments: MinA for LE negotiation out of bed  Transfers Overall transfer level: Needs assistance Equipment used: Rolling walker (2 wheeled) Transfers: Sit to/from Stand Sit to Stand: Mod assist;+2 physical assistance         General transfer comment: ModA + 2 to stand from edge of bed, cues for hand placement    Balance Overall balance assessment: Needs assistance Sitting-balance support: Feet supported Sitting  balance-Leahy Scale: Fair Sitting balance - Comments: Fair statically, requires minA for donning socks     Standing balance-Leahy Scale: Poor Standing balance comment: reliant on external support                           ADL either performed or assessed with clinical judgement   ADL Overall ADL's : Needs assistance/impaired Eating/Feeding: Set up;Sitting   Grooming: Set up;Supervision/safety;Sitting   Upper Body Bathing: Minimal assistance;Sitting   Lower Body Bathing: Moderate assistance;Sit to/from stand   Upper Body Dressing : Minimal assistance;Sitting   Lower Body Dressing: Moderate assistance;Sit to/from stand;Maximal assistance Lower Body Dressing Details (indicate cue type and reason): Providing support to bring ankle to knee (Max A for holding figure four position), pt then donning socks with Min A to slip sock over right toes.  Toilet Transfer: Moderate assistance;+2 for physical assistance;+2 for safety/equipment;Ambulation;RW           Functional mobility during ADLs: Moderate assistance;+2 for physical assistance;+2 for safety/equipment;Rolling walker General ADL Comments: Pt presenting with decreased balance, strength, cognition, and acitivty tolerance. Motivated to participate in therapy despite fear of falls and pain.      Vision         Perception     Praxis      Pertinent Vitals/Pain Pain Assessment: Faces Faces Pain Scale: Hurts even more Pain Location: bilateral knees with movement Pain Descriptors / Indicators: Discomfort;Grimacing Pain Intervention(s): Monitored during session;Limited activity within patient's tolerance;Repositioned     Hand Dominance     Extremity/Trunk Assessment Upper Extremity Assessment Upper Extremity Assessment: Generalized weakness  Lower Extremity Assessment Lower Extremity Assessment: Defer to PT evaluation RLE Deficits / Details: Grossly 4/5 RLE Coordination: decreased gross motor LLE Deficits /  Details: Grossly 4/5 LLE Coordination: decreased gross motor       Communication Communication Communication: No difficulties   Cognition Arousal/Alertness: Awake/alert Behavior During Therapy: WFL for tasks assessed/performed Overall Cognitive Status: Impaired/Different from baseline Area of Impairment: Orientation;Safety/judgement;Problem solving;Following commands;Awareness                 Orientation Level: Disoriented to;Time     Following Commands: Follows one step commands inconsistently;Follows one step commands with increased time Safety/Judgement: Decreased awareness of safety;Decreased awareness of deficits Awareness: Intellectual Problem Solving: Slow processing;Requires verbal cues General Comments: Pt not oriented to year, able to correctly answer when given options. Decreased following of cues and proccessing. Requiring increased time throughout. Increased fear of falls.    General Comments  Husband present throughout session    Exercises     Shoulder Instructions      Home Living Family/patient expects to be discharged to:: Private residence Living Arrangements: Spouse/significant other;Children(daughter) Available Help at Discharge: Family;Available 24 hours/day Type of Home: House Home Access: Stairs to enter [employer] of Steps: 3   Home Layout: One level     Bathroom Shower/Tub: Occupational psychologist: Standard     Home Equipment: Environmental consultant - 4 wheels;Shower seat;Grab bars - tub/shower          Prior Functioning/Environment Level of Independence: Needs assistance  Gait / Transfers Assistance Needed: uses Rollator for community ambulation, furniture walks indoors ADL's / Homemaking Assistance Needed: In the last couple of weeks, pt requiring increased assist, including feeding, dressing   Comments: history of recent fall        OT Problem List: Decreased strength;Decreased range of motion;Decreased activity  tolerance;Impaired balance (sitting and/or standing);Decreased knowledge of use of DME or AE;Decreased knowledge of precautions;Pain;Decreased cognition;Decreased coordination      OT Treatment/Interventions: Self-care/ADL training;Therapeutic exercise;Energy conservation;DME and/or AE instruction;Therapeutic activities;Patient/family education    OT Goals(Current goals can be found in the care plan section) Acute Rehab OT Goals Patient Stated Goal: pt and pt husband would like her to get stronger OT Goal Formulation: With patient Time For Goal Achievement: 05/07/19 Potential to Achieve Goals: Good  OT Frequency: Min 2X/week   Barriers to D/C:            Co-evaluation PT/OT/SLP Co-Evaluation/Treatment: Yes Reason for Co-Treatment: Complexity of the patient's impairments (multi-system involvement);For patient/therapist safety;To address functional/ADL transfers PT goals addressed during session: Mobility/safety with mobility OT goals addressed during session: ADL's and self-care      AM-PAC OT "6 Clicks" Daily Activity     Outcome Measure Help from another person eating meals?: None Help from another person taking care of personal grooming?: A Little Help from another person toileting, which includes using toliet, bedpan, or urinal?: A Lot Help from another person bathing (including washing, rinsing, drying)?: A Lot Help from another person to put on and taking off regular upper body clothing?: A Little Help from another person to put on and taking off regular lower body clothing?: A Lot 6 Click Score: 16   End of Session Equipment Utilized During Treatment: Gait belt;Rolling walker Nurse Communication: Mobility status  Activity Tolerance: Patient tolerated treatment well Patient left: in chair;with call bell/phone within reach;with chair alarm set;with family/visitor present  OT Visit Diagnosis: Unsteadiness on feet (R26.81);Other abnormalities of gait and mobility  (R26.89);Muscle weakness (generalized) (M62.81);Pain Pain -  Right/Left: (bilateral) Pain - part of body: Knee                Time: 1137-1200 OT Time Calculation (min): 23 min Charges:  OT General Charges $OT Visit: 1 Visit OT Evaluation $OT Eval Moderate Complexity: Kirk, OTR/L Acute Rehab Pager: [phone] Office: [place] 04/23/2019, 2:19 PM

## 2019-04-23 NOTE — Evaluation (Signed)
Physical Therapy Evaluation Patient Details Name: Tiffany [name] MRN: [id number] DOB: 1950-11-11 Today's Date: 04/23/2019   History of Present Illness  69 y.o. female with medical history significant of hypothyroidism, bipolar 1 disorder, psoriatic arthritis, COVID-19 positive on 1/23.  She presents after having a fall this morning at home unable to get up and family notes worsening confusion.  MRI showing no acute findings.  Clinical Impression  In the past several weeks prior to admission, pt has required increased assist with ADL's and mobility in setting of Covid-19 infection. She is typically independent. On PT evaluation, pt presents with cognitive deficits, static/dynamic balance impairments, gross weakness and decreased coordination. Pt requiring two person moderate assist for ambulating 15 feet with a walker (close chair follow utilized). Recommending CIR to address deficits, maximize functional mobility and decrease caregiver burden. Suspect good progress considering pt motivation, PLOF, and family support.     Follow Up Recommendations CIR    Equipment Recommendations  Rolling walker with 5" wheels;Wheelchair (measurements PT);3in1 (PT);Wheelchair cushion (measurements PT)    Recommendations for Other Services Rehab consult     Precautions / Restrictions Precautions Precautions: Fall Restrictions Weight Bearing Restrictions: No      Mobility  Bed Mobility Overal bed mobility: Needs Assistance Bed Mobility: Supine to Sit     Supine to sit: Min assist     General bed mobility comments: MinA for LE negotiation out of bed  Transfers Overall transfer level: Needs assistance Equipment used: Rolling walker (2 wheeled) Transfers: Sit to/from Stand Sit to Stand: Mod assist;+2 physical assistance         General transfer comment: ModA + 2 to stand from edge of bed, cues for hand placement  Ambulation/Gait Ambulation/Gait assistance: Mod assist;+2 physical  assistance Gait Distance (Feet): 15 Feet Assistive device: Rolling walker (2 wheeled) Gait Pattern/deviations: Step-through pattern;Decreased stride length;Wide base of support;Decreased dorsiflexion - right;Decreased dorsiflexion - left Gait velocity: decreased Gait velocity interpretation: <1.8 ft/sec, indicate of risk for recurrent falls General Gait Details: Pt requiring modA + 2 for stability, close chair follow utilized. Cues for closer BOS, walker proximity, stepping initiation. Noted bilateral knee instability  Stairs            Wheelchair Mobility    Modified Rankin (Stroke Patients Only)       Balance Overall balance assessment: Needs assistance Sitting-balance support: Feet supported Sitting balance-Leahy Scale: Fair Sitting balance - Comments: Fair statically, requires minA for donning socks     Standing balance-Leahy Scale: Poor Standing balance comment: reliant on external support                             Pertinent Vitals/Pain Pain Assessment: Faces Faces Pain Scale: Hurts even more Pain Location: bilateral knees with movement Pain Descriptors / Indicators: Discomfort;Grimacing Pain Intervention(s): Monitored during session;Repositioned    Home Living Family/patient expects to be discharged to:: Private residence Living Arrangements: Spouse/significant other;Children(daughter) Available Help at Discharge: Family;Available 24 hours/day Type of Home: House Home Access: Stairs to enter   [employer] of Steps: 3 Home Layout: One level Home Equipment: Walker - 4 wheels;Shower seat;Grab bars - tub/shower      Prior Function Level of Independence: Needs assistance   Gait / Transfers Assistance Needed: uses Rollator for community ambulation, furniture walks indoors  ADL's / Homemaking Assistance Needed: In the last couple of weeks, pt requiring increased assist, including feeding, dressing  Comments: history of recent fall  Hand Dominance        Extremity/Trunk Assessment   Upper Extremity Assessment Upper Extremity Assessment: Defer to OT evaluation    Lower Extremity Assessment Lower Extremity Assessment: RLE deficits/detail;LLE deficits/detail RLE Deficits / Details: Grossly 4/5 RLE Coordination: decreased gross motor LLE Deficits / Details: Grossly 4/5 LLE Coordination: decreased gross motor       Communication   Communication: No difficulties  Cognition Arousal/Alertness: Awake/alert Behavior During Therapy: WFL for tasks assessed/performed Overall Cognitive Status: Impaired/Different from baseline Area of Impairment: Orientation;Safety/judgement;Problem solving                 Orientation Level: Disoriented to;Time       Safety/Judgement: Decreased awareness of safety;Decreased awareness of deficits   Problem Solving: Slow processing;Requires verbal cues General Comments: Pt not oriented to year, able to correctly answer when given options. Decreased psychomotor processing.      General Comments      Exercises     Assessment/Plan    PT Assessment Patient needs continued PT services  PT Problem List Decreased strength;Decreased activity tolerance;Decreased balance;Decreased mobility;Decreased coordination;Decreased cognition;Decreased safety awareness       PT Treatment Interventions DME instruction;Gait training;Stair training;Functional mobility training;Therapeutic activities;Therapeutic exercise;Balance training;Patient/family education    PT Goals (Current goals can be found in the Care Plan section)  Acute Rehab PT Goals Patient Stated Goal: pt and pt husband would like her to get stronger PT Goal Formulation: With patient/family Time For Goal Achievement: 05/07/19 Potential to Achieve Goals: Good    Frequency Min 3X/week   Barriers to discharge        Co-evaluation PT/OT/SLP Co-Evaluation/Treatment: Yes Reason for Co-Treatment: Complexity of the  patient's impairments (multi-system involvement);For patient/therapist safety;To address functional/ADL transfers PT goals addressed during session: Mobility/safety with mobility         AM-PAC PT "6 Clicks" Mobility  Outcome Measure Help needed turning from your back to your side while in a flat bed without using bedrails?: A Little Help needed moving from lying on your back to sitting on the side of a flat bed without using bedrails?: A Little Help needed moving to and from a bed to a chair (including a wheelchair)?: A Lot Help needed standing up from a chair using your arms (e.g., wheelchair or bedside chair)?: A Lot Help needed to walk in hospital room?: A Lot Help needed climbing 3-5 steps with a railing? : Total 6 Click Score: 13    End of Session Equipment Utilized During Treatment: Gait belt Activity Tolerance: Patient tolerated treatment well Patient left: in chair;with call bell/phone within reach;with chair alarm set Nurse Communication: Mobility status PT Visit Diagnosis: Unsteadiness on feet (R26.81);Difficulty in walking, not elsewhere classified (R26.2);Muscle weakness (generalized) (M62.81)    Time: 1157-2620 PT Time Calculation (min) (ACUTE ONLY): 24 min   Charges:   PT Evaluation $PT Eval Moderate Complexity: 1 Mod            Wyona Almas, PT, DPT Acute Rehabilitation Services Pager [phone] Office [phone]   Deno Etienne 04/23/2019, 1:56 PM

## 2019-04-23 NOTE — Plan of Care (Signed)

## 2019-04-23 NOTE — Procedures (Signed)
Patient Name:Tiffany [name] HAF:[id number] Epilepsy Attending:Reggie Welge Barbra Sarks Referring Physician/Provider:Dr Roland Rack Duration: 04/22/2019 1905 to 04/23/2019 0946  Patient VANVBTY:58AY F with drug induced parkinsonism, long history of lithium use and prior COVID infection presented with waxing and waning mental status. EEG to evaluate for seizure  Level of alertness:awake, asleep  AEDs during EEG study:Clonazepam, keppra  Technical aspects: This EEG study was done with scalp electrodes positioned according to the 10-20 International system of electrode placement. Electrical activity was acquired at a sampling rate of 500Hz  and reviewed with a high frequency filter of 70Hz  and a low frequency filter of 1Hz . EEG data were recorded continuously and digitally stored.  DESCRIPTION:During awake state, no clear posterior dominant rhythm was seen. Sleep was characterized by vertex waves, sleep spindles 912-14hz ), maximal frontocentral, slow wave sleep and REM sleep. EEG also showed continuous generalized 3-6Hz  theta-delta slowing, at times with triphasic morphology.  Sharp waves were seen in left temporoparietal region, maximal P7/P3.  These sharp waves are at time periodic, at 0.5-1 Hz and rhythmic without clear evolution, lasting 10 to 15 seconds and without any clinical change.  Hyperventilation and photic stimulation were not performed.  ABNORMALITY -Sharp waves, left temporoparietal region -Continuous slow, generalized  IMPRESSION: This study issuggestive of epileptogenicity arising from left temporoparietal region.  The sharp waves at times appeared rhythmic and periodic without definite clinical changes.  Additionally there is evidence of mild moderate diffuse encephalopathy, non specific to etiology. No clear seizures were seen throughout the recording.  Braxton Weisbecker Barbra Sarks

## 2019-04-23 NOTE — Progress Notes (Signed)
  Speech Language Pathology Treatment: Dysphagia  Patient Details Name: Tiffany [name] MRN: [id number] DOB: 1950/11/25 Today's Date: 04/23/2019 Time: 5537-4827 SLP Time Calculation (min) (ACUTE ONLY): 15 min  Assessment / Plan / Recommendation Clinical Impression  Pt was encountered awake/alert with husband present at bedside and she was agreeable to ST tx session targeting dysphagia.  MD observed coughing with thin liquids this AM and ordered a BSE.  Order was acknowledged and treatment session was completed in place of BSE secondary to pt already being on caseload.  Pt consumed 3 oz of thin liquid via straw sip with continuous sips and she exhibited an immediate throat clear.  This was repeated with thin liquid via cup sip and no throat clear or cough was observed.  Pt additionally consumed nectar-thick liquid via cup sip without overt s/sx of aspiration.  Pt then consumed Dysphagia 3 (soft) solids from her breakfast meal tray and a delayed throat clear was observed.  Pt also reported that she had coughing with one item from her breakfast meal tray this AM.  Suggested downgrade to Dysphagia 2 (fine chop) solids; however, pt reported that she would prefer to remain on Dysphagia 3 solids at this time.  Husband agreed with the pt.  Pt may benefit from an instrumental swallow study if clinical s/sx of aspiration persist.  Spoke with pt and husband about a MBS and how it could be beneficial in helping to determine the safest/least restrictive diet for the pt.  At this time, recommend continuation of Dysphagia 3 (soft) solids per pt request and thin liquids (NO STRAWS) with intermittent supervision to cue for compensatory strategies.  SLP will continue to f/u for diagnostic tx per POC.     HPI HPI: Tiffany [name] is a 69 y.o. female with medical history significant of hypothyroidism, bipolar 1 disorder, psoriatic arthritis, COVID-19 positive on 1/23.  She presents after having a fall at home, unable to get up and  family notes worsening confusion.  RN reported coughing when medications were consumed with thin liquids.        SLP Plan  Continue with current plan of care       Recommendations  Diet recommendations: Dysphagia 3 (mechanical soft);Thin liquid Liquids provided via: Cup;No straw Medication Administration: Whole meds with puree Supervision: Patient able to self feed;Intermittent supervision to cue for compensatory strategies Compensations: Minimize environmental distractions;Slow rate;Small sips/bites Postural Changes and/or Swallow Maneuvers: Seated upright 90 degrees                Oral Care Recommendations: Oral care BID Follow up Recommendations: (TBD) SLP Visit Diagnosis: Dysphagia, unspecified (R13.10) Plan: Continue with current plan of care                      Colin Mulders M.S., [place] Office: [phone]  [place] 04/23/2019, 10:02 AM

## 2019-04-24 DIAGNOSIS — G9341 Metabolic encephalopathy: Principal | ICD-10-CM

## 2019-04-24 LAB — CBC
HCT: 39.4 % (ref 36.0–46.0)
Hemoglobin: 12.4 g/dL (ref 12.0–15.0)
MCH: 30.2 pg (ref 26.0–34.0)
MCHC: 31.5 g/dL (ref 30.0–36.0)
MCV: 95.9 fL (ref 80.0–100.0)
Platelets: 191 10*3/uL (ref 150–400)
RBC: 4.11 MIL/uL (ref 3.87–5.11)
RDW: 14.2 % (ref 11.5–15.5)
WBC: 8.5 10*3/uL (ref 4.0–10.5)
nRBC: 0 % (ref 0.0–0.2)

## 2019-04-24 LAB — BASIC METABOLIC PANEL
Anion gap: 9 (ref 5–15)
BUN: 11 mg/dL (ref 8–23)
CO2: 23 mmol/L (ref 22–32)
Calcium: 9.3 mg/dL (ref 8.9–10.3)
Chloride: 109 mmol/L (ref 98–111)
Creatinine, Ser: 1.82 mg/dL — ABNORMAL HIGH (ref 0.44–1.00)
GFR calc Af Amer: 32 mL/min — ABNORMAL LOW (ref >60)
GFR calc non Af Amer: 28 mL/min — ABNORMAL LOW (ref >60)
Glucose, Bld: 89 mg/dL (ref 70–99)
Potassium: 3.1 mmol/L — ABNORMAL LOW (ref 3.5–5.1)
Sodium: 141 mmol/L (ref 135–145)

## 2019-04-24 MED ORDER — POTASSIUM CHLORIDE 20 MEQ PO PACK
40.0000 meq | PACK | Freq: Once | ORAL | Status: AC
  Administered 2019-04-24: 40 meq via ORAL
  Filled 2019-04-24: qty 2

## 2019-04-24 MED ORDER — AMLODIPINE BESYLATE 2.5 MG PO TABS
2.5000 mg | ORAL_TABLET | Freq: Every day | ORAL | Status: DC
  Administered 2019-04-24 – 2019-04-26 (×3): 2.5 mg via ORAL
  Filled 2019-04-24 (×3): qty 1

## 2019-04-24 NOTE — Consult Note (Signed)
Physical Medicine and Rehabilitation Consult Reason for Consult:metabolic encephalopathy Referring Phsyician: Regalado Tiffany [name] is an 69 y.o. female.   HPI: 69 year old female with history of hyper thyroidism, bipolar disorder psoriatic arthritis.  She has a 1 to 31-monthhistory of increasing confusion.  She did have COVID-19 in January.  She is currently afebrile and her diarrhea has improved although she did have a 22 pound weight loss.  She had a fall on 04/21/2019 which led to ED evaluation Pain on 04/21/2019 demonstrated small chronic left frontal infarct as well as mild to moderate chronic small vessel disease changes bilaterally.  Chest x-ray showed some left lung base atelectasis.  Urine was negative.  Blood chemistries demonstrated low potassium but no other abnormalities.  Patient did initially have mildly elevated white count of 11 K but now is down to 8.5.  Patient evaluated by psychiatry on 04/23/2019.  Meds for bipolar were evaluated and lithium was discontinued. Physical therapy evaluation 04/23/2019 found patient to require 2 person mod assist for ambulation.  OT evaluation on 04/23/2019 demonstrated need for min assist for upper body ADLs and mod assist for lower body ADLs.   Review of Systems -  Review of Systems  Constitutional: Positive for malaise/fatigue and weight loss. Negative for chills and fever.  HENT: Negative for ear discharge, hearing loss and nosebleeds.   Eyes: Negative for discharge and redness.  Respiratory: Negative for cough, hemoptysis, sputum production, shortness of breath, wheezing and stridor.   Cardiovascular: Negative for chest pain.  Gastrointestinal: Positive for diarrhea. Negative for abdominal pain, nausea and vomiting.  Genitourinary: Positive for frequency.  Musculoskeletal: Positive for joint pain.       Ankle pain   Skin: Negative for itching and rash.  Neurological: Positive for tremors. Negative for seizures and headaches.  Endo/Heme/Allergies:  Does not bruise/bleed easily.  Psychiatric/Behavioral:       Hx bipolar d/o    Past Medical History:  Diagnosis Date  . B[place] 1 disorder [place]   . Depression 1987  . [place]'s disease [place]  . [place]   . PTSD (post-traumatic stress disorder)    Past Surgical History:  Procedure Laterality Date  . ABDOMINAL HYSTERECTOMY  1987  . CHOLECYSTECTOMY  1979  . DILATION AND CURETTAGE OF UTERUS  1973  . MASTECTOMY Bilateral 09/19/2014  . TONSILLECTOMY  1970  . URETERAL REIMPLANTION Bilateral 1974   Family History  Problem Relation Age of Onset  . Cancer Mother   . Cancer Sister   . Stroke Maternal Grandfather   . Diabetes Paternal Grandfather   . Cancer Sister    Social History:  reports that she has never smoked. She has never used smokeless tobacco. She reports that she does not drink alcohol or use drugs. Allergies:  Allergies  Allergen Reactions  . Aripiprazole Other (See Comments)    Parkinsonism     . Methotrexate Other (See Comments)    Hair loss, severe stomatitis    . Amoxicillin-Pot Clavulanate Other (See Comments)    Unknown reaction   . Cefdinir Other (See Comments)    Other reaction(s): Diarrhea Yeast infection and fever; negative c diff  . Etanercept Other (See Comments)    Headaches    . Exemestane Other (See Comments)    Suicidal thoughts with medication    . Fluoxetine Other (See Comments)    Parkinsonism  . Methylprednisolone Sodium Succ Other (See Comments)    Agitated mania  . Epinephrine Palpitations    tachycardia   .  Nitrofurantoin Nausea And Vomiting and Rash        Medications Prior to Admission  Medication Sig Dispense Refill  . acetaminophen (TYLENOL) 500 MG tablet Take 500 mg by mouth daily as needed for headache (pain).     . AZELAIC ACID EX Apply 1 application topically daily. 15% gel, apply to face.    . calcitRIOL (ROCALTROL) 0.25 MCG capsule Take 0.25 mcg by mouth every Monday, Wednesday, and  Friday.     . Certolizumab Pegol (CIMZIA) 2 X 200 MG KIT Inject 200 mg into the skin every 14 (fourteen) days. Administered by [facility] - for psoriatic arthritis     . Cholecalciferol 50 MCG (2000 UT) TABS Take 1-2 tablets by mouth See admin instructions. Take 1 tablet by mouth 1st day, then take 2 tablets by mouth 2nd day, then repeat    . clonazePAM (KLONOPIN) 0.5 MG tablet Take 1 to 2 tablets at bedtime. (Patient taking differently: Take 0.5-1 mg by mouth at bedtime. ) 60 tablet 2  . INGREZZA 40 MG CAPS Take 1 capsule by mouth at bedtime. (Patient taking differently: Take 40 mg by mouth at bedtime. ) 30 capsule 2  . lamoTRIgine (LAMICTAL) 100 MG tablet Take three tablets daily. (Patient taking differently: Take 300 mg by mouth at bedtime. Take three tablets daily.) 270 tablet 1  . levothyroxine (SYNTHROID) 125 MCG tablet Take 125 mcg by mouth daily before breakfast.     . Lidocaine 4 % PTCH Apply 1 patch topically See admin instructions. Apply up to 3 times daily as needed for pain.    Marland Kitchen lithium carbonate (LITHOBID) 300 MG CR tablet Take 300 mg by mouth at bedtime.     Marland Kitchen LORazepam (ATIVAN) 0.5 MG tablet Take 1 tablet (0.5 mg total) by mouth daily as needed. (Patient taking differently: Take 0.5 mg by mouth daily as needed for anxiety. ) 30 tablet 2  . sertraline (ZOLOFT) 25 MG tablet Take 2 tablets (50 mg total) by mouth daily. (Patient taking differently: Take 37.5 mg by mouth daily with breakfast. ) 180 tablet 1  . sodium bicarbonate 650 MG tablet Take 650 mg by mouth 2 (two) times daily.    . vitamin B-12 (CYANOCOBALAMIN) 1000 MCG tablet Take 1 tablet by mouth daily with breakfast.       Home: Home Living Family/patient expects to be discharged to:: Private residence Living Arrangements: Spouse/significant other, Children(daughter) Available Help at Discharge: Family, Available 24 hours/day Type of Home: House Home Access: Stairs to enter [employer] of Steps:  3 Home Layout: One level Bathroom Shower/Tub: Multimedia programmer: Standard Home Equipment: Environmental consultant - 4 wheels, Shower seat, Grab bars - tub/shower  Functional History: Prior Function Comments: history of recent fall Functional Status:  Mobility:     Ambulation/Gait Gait Distance (Feet): 15 Feet Gait velocity: decreased General Gait Details: Pt requiring modA + 2 for stability, close chair follow utilized. Cues for closer BOS, walker proximity, stepping initiation. Noted bilateral knee instability    ADL: ADL Lower Body Dressing Details (indicate cue type and reason): Providing support to bring ankle to knee (Max A for holding figure four position), pt then donning socks with Min A to slip sock over right toes.   Cognition: Cognition Overall Cognitive Status: Impaired/Different from baseline Orientation Level: Oriented X4 Cognition Arousal/Alertness: Awake/alert Behavior During Therapy: WFL for tasks assessed/performed Overall Cognitive Status: Impaired/Different from baseline Area of Impairment: Orientation, Safety/judgement, Problem solving, Following commands, Awareness Orientation Level: Disoriented to, Time Following  Commands: Follows one step commands inconsistently, Follows one step commands with increased time Safety/Judgement: Decreased awareness of safety, Decreased awareness of deficits Awareness: Intellectual Problem Solving: Slow processing, Requires verbal cues General Comments: Pt not oriented to year, able to correctly answer when given options. Decreased following of cues and proccessing. Requiring increased time throughout. Increased fear of falls.   Blood pressure (!) 170/68, pulse (!) 59, temperature 98.3 F (36.8 C), temperature source Oral, resp. rate 16, height 5' 4" (1.626 m), weight 90.3 kg, SpO2 98 %. Physical Exam  Nursing note and vitals reviewed. Constitutional: She is oriented to person, place, and time. She appears well-developed and  well-nourished.  HENT:  Head: Normocephalic and atraumatic.  Eyes: Pupils are equal, round, and reactive to light. Conjunctivae and EOM are normal.  Cardiovascular: Normal rate, regular rhythm and normal heart sounds.  No murmur heard. Respiratory: Effort normal and breath sounds normal. No stridor. No respiratory distress. She has no wheezes.  GI: Soft. Bowel sounds are normal. She exhibits no distension.  Musculoskeletal:     Cervical back: Normal range of motion.     Comments: There is decreased ankle dorsiflexion bilaterally no tenderness over the Achilles tendon.  No pain with ankle inversion or eversion. There is no evidence of joint swelling noted.  No erythema.  No tenderness to palpation over the deltoid ligament, anterior talofibular ligament or Achilles tendon.  No tenderness over the joint line.  Neurological: She is alert and oriented to person, place, and time. She displays tremor.  Motor strength is 5/5 bilateral deltoid bicep tricep grip 3 - bilateral hip flexors 4 - bilateral knee extensors and 4 - bilateral ankle dorsiflexors.  There is a fine tremor noted during cerebellar testing she has no past pointing with finger-nose-finger exam bilaterally There is no evidence of nystagmus  Skin: Skin is warm and dry.  No evidence of psoriatic plaques on the lower extremities  Results for orders placed or performed during the hospital encounter of 04/21/19 (from the past 24 hour(s))  Basic metabolic panel     Status: Abnormal   Collection Time: 04/24/19  2:48 AM  Result Value Ref Range   Sodium 141 135 - 145 mmol/L   Potassium 3.1 (L) 3.5 - 5.1 mmol/L   Chloride 109 98 - 111 mmol/L   CO2 23 22 - 32 mmol/L   Glucose, Bld 89 70 - 99 mg/dL   BUN 11 8 - 23 mg/dL   Creatinine, Ser 1.82 (H) 0.44 - 1.00 mg/dL   Calcium 9.3 8.9 - 10.3 mg/dL   GFR calc non Af Amer 28 (L) >60 mL/min   GFR calc Af Amer 32 (L) >60 mL/min   Anion gap 9 5 - 15  CBC     Status: None   Collection Time:  04/24/19  2:48 AM  Result Value Ref Range   WBC 8.5 4.0 - 10.5 K/uL   RBC 4.11 3.87 - 5.11 MIL/uL   Hemoglobin 12.4 12.0 - 15.0 g/dL   HCT 39.4 36.0 - 46.0 %   MCV 95.9 80.0 - 100.0 fL   MCH 30.2 26.0 - 34.0 pg   MCHC 31.5 30.0 - 36.0 g/dL   RDW 14.2 11.5 - 15.5 %   Platelets 191 150 - 400 K/uL   nRBC 0.0 0.0 - 0.2 %   No results found.  Assessment/Plan: Diagnosis: Debility and encephalopathy in a patient with Parkinson's disease with recent Covid infection. 1. Does the need for close, 24 hr/day medical supervision  in concert with the patient's rehab needs make it unreasonable for this patient to be served in a less intensive setting? Yes 2. Co-Morbidities requiring supervision/potential complications: Psoriatic arthritis, bipolar disorder, history of falls, CKD 3. Due to bladder management, bowel management, safety, skin/wound care, disease management, medication administration, pain management and patient education, does the patient require 24 hr/day rehab nursing? Yes 4. Does the patient require coordinated care of a physician, rehab nurse, PT (1-2 hrs/day, 5 days/week), OT (1-2 hrs/day, 5 days/week) and SLP (.5-1 hrs/day, 5 days/week) to address physical and functional deficits in the context of the above medical diagnosis(es)? Yes Addressing deficits in the following areas: balance, endurance, locomotion, strength, transferring, bowel/bladder control, bathing, dressing, feeding, grooming, toileting, cognition, speech, language, swallowing and psychosocial support 5. Can the patient actively participate in an intensive therapy program of at least 3 hrs of therapy per day at least 5 days per week? Yes 6. The potential for patient to make measurable gains while on inpatient rehab is excellent 7. Anticipated functional outcomes upon discharge from inpatients are supervision mobility PT, supervision ADLs OT, supervision with med management SLP 8. Estimated rehab length of stay to reach the  above functional goals is: 10 to 14 days 9. Does the patient have adequate social supports to accommodate these discharge functional goals? Yes 10. Anticipated D/C setting: Home 11. Anticipated post D/C treatments: [place] therapy 12. Overall Rehab/Functional Prognosis: good  RECOMMENDATIONS: This patient's condition is appropriate for continued rehabilitative care in the following setting: CIR Patient has agreed to participate in recommended program. Yes Note that insurance prior authorization may be required for reimbursement for recommended care.  Comment:   Charlett Blake 04/24/2019

## 2019-04-24 NOTE — Progress Notes (Signed)
Physical Therapy Treatment Patient Details Name: Tiffany [name] MRN: [id number] DOB: 04/02/1950 Today's Date: 04/24/2019    History of Present Illness 69 y.o. female with medical history significant of hypothyroidism, bipolar 1 disorder, psoriatic arthritis, COVID-19 positive on 1/23.  She presents after having a fall this morning at home unable to get up and family notes worsening confusion.  MRI showing no acute findings.    PT Comments    Pt performed gt training and functional mobility during session this pm.  Pt continues to present with cognitiive deficits but much improved per husband.  She is improving functionally well but continues to require min-mod assistance.  Based on progress, she will be an excellent candidate to tolerate aggressive CIR therapy placement.  Will continue PT per POC.    Follow Up Recommendations  CIR     Equipment Recommendations  Rolling walker with 5" wheels;Wheelchair (measurements PT);3in1 (PT);Wheelchair cushion (measurements PT)    Recommendations for Other Services       Precautions / Restrictions Precautions Precautions: Fall Restrictions Weight Bearing Restrictions: No    Mobility  Bed Mobility Overal bed mobility: Needs Assistance Bed Mobility: Supine to Sit;Sit to Supine     Supine to sit: Supervision;HOB elevated     General bed mobility comments: Supervision with heavy use of rail and HOB elevated.  Transfers Overall transfer level: Needs assistance Equipment used: Rolling walker (2 wheeled) Transfers: Sit to/from Stand Sit to Stand: Min assist;Mod assist         General transfer comment: Cues for hand placement to push into standing.  On first attempt LOB posterior once in standing and required mod assistance to correct.  Poor hand placement and eccentric load returning to seated surface.  Ambulation/Gait Ambulation/Gait assistance: Mod assist Gait Distance (Feet): 40 Feet Assistive device: Rolling walker (2 wheeled) Gait  Pattern/deviations: Step-through pattern;Decreased stride length;Wide base of support;Decreased dorsiflexion - right;Decreased dorsiflexion - left     General Gait Details: Pt required cues for upper trunk control and increasing stride length.  She required assistance to maintain path of RW and to turn and back.  Mildly insteady gt noted, no knee buckling or LOB but does report pain in B knees.   Stairs             Wheelchair Mobility    Modified Rankin (Stroke Patients Only)       Balance Overall balance assessment: Needs assistance Sitting-balance support: Feet supported Sitting balance-Leahy Scale: Fair       Standing balance-Leahy Scale: Poor                              Cognition Arousal/Alertness: Awake/alert Behavior During Therapy: WFL for tasks assessed/performed Overall Cognitive Status: Impaired/Different from baseline Area of Impairment: Memory;Following commands;Safety/judgement;Problem solving                     Memory: Decreased recall of precautions;Decreased short-term memory Following Commands: Follows one step commands inconsistently;Follows one step commands with increased time Safety/Judgement: Decreased awareness of safety;Decreased awareness of deficits   Problem Solving: Slow processing;Requires verbal cues General Comments: Unable to recall her grand children's names.      Exercises General Exercises - Lower Extremity Long Arc Quad: AROM;Both;10 reps;Seated    General Comments        Pertinent Vitals/Pain Pain Assessment: Faces Faces Pain Scale: Hurts little more Pain Location: bilateral knees with movement R>L Pain Descriptors / Indicators: Discomfort;Grimacing Pain  Intervention(s): Monitored during session;Limited activity within patient's tolerance    Home Living                      Prior Function            PT Goals (current goals can now be found in the care plan section) Acute Rehab PT  Goals Patient Stated Goal: pt and pt husband would like her to get stronger Potential to Achieve Goals: Good Progress towards PT goals: Progressing toward goals    Frequency    Min 3X/week      PT Plan Current plan remains appropriate    Co-evaluation              AM-PAC PT "6 Clicks" Mobility   Outcome Measure  Help needed turning from your back to your side while in a flat bed without using bedrails?: A Little Help needed moving from lying on your back to sitting on the side of a flat bed without using bedrails?: A Little Help needed moving to and from a bed to a chair (including a wheelchair)?: A Little Help needed standing up from a chair using your arms (e.g., wheelchair or bedside chair)?: A Little Help needed to walk in hospital room?: A Lot Help needed climbing 3-5 steps with a railing? : A Lot 6 Click Score: 16    End of Session Equipment Utilized During Treatment: Gait belt Activity Tolerance: Patient tolerated treatment well Patient left: in chair;with call bell/phone within reach;with chair alarm set Nurse Communication: Mobility status PT Visit Diagnosis: Unsteadiness on feet (R26.81);Difficulty in walking, not elsewhere classified (R26.2);Muscle weakness (generalized) (M62.81)     Time: 1740-8144 PT Time Calculation (min) (ACUTE ONLY): 25 min  Charges:  $Gait Training: 8-22 mins $Therapeutic Activity: 8-22 mins                     Erasmo Leventhal , PTA Acute Rehabilitation Services Pager [phone] Office [phone]     Najla Aughenbaugh Eli Hose 04/24/2019, 1:51 PM

## 2019-04-24 NOTE — Progress Notes (Signed)
Reason for consult:   Subjective: Patient awake and alert.  Oriented to herself and place, could not state month or year.  Appeared cheerful, was having lunch.  Stated her tremors were improved, and this is the first time in weeks since she has been eating by herself.    ROS: negative except above   Examination  Vital signs in last 24 hours: Temp:  [97.6 F (36.4 C)-98.6 F (37 C)] 98.3 F (36.8 C) (02/13 0900) Pulse Rate:  [51-59] 59 (02/13 0900) Resp:  [16-17] 16 (02/13 0900) BP: (120-170)/(54-84) 170/68 (02/13 0900) SpO2:  [95 %-100 %] 98 % (02/13 0900)  General: Sitting up in bed CVS: pulse-normal rate and rhythm RS: breathing comfortably Extremities: normal   Neuro: MS: Alert, oriented to herself and place, still confused and has difficulty with memory including remembering names of some family members,  follows commands CN: pupils equal and reactive,  EOMI, face symmetric, tongue midline Motor: normal strength in bilateral upper extremities, wiggles toes antigravity in both legs. Mild postural tremors Coordination: normal Gait: not tested  Basic Metabolic Panel: Recent Labs  Lab 04/21/19 0728 04/21/19 0728 04/22/19 0258 04/23/19 0308 04/24/19 0248  NA 142  --  142 146* 141  K 3.5  --  3.2* 4.4 3.1*  CL 107  --  111 115* 109  CO2 24  --  20* 20* 23  GLUCOSE 107*  --  103* 92 89  BUN 17  --  16 15 11   CREATININE 2.71*  --  2.18* 1.86* 1.82*  CALCIUM 10.0   < > 9.3 9.2 9.3   < > = values in this interval not displayed.    CBC: Recent Labs  Lab 04/21/19 0728 04/22/19 0258 04/23/19 0308 04/24/19 0248  WBC 17.8* 11.4* 11.7* 8.5  NEUTROABS 15.0*  --   --   --   HGB 13.7 12.9 12.6 12.4  HCT 44.5 41.1 40.3 39.4  MCV 95.9 95.4 96.0 95.9  PLT 215 181 144* 191     Coagulation Studies: No results for input(s): LABPROT, INR in the last 72 hours.  Imaging Reviewed: MRI brain: No acute findings  Reviewed cEEG report: Sharp waves arising from the left  temporal parietal region suggestive of epileptogenic city    ASSESSMENT AND PLAN  69 year old female with longstanding history of mood disorder with multiple psychiatric medications, drug-induced tremors presenting with worsening gait and confusion. Work-up included MRI brain, continues EEG monitoring, metabolic work-up including thyroid functions, B12 which was 1565 and lamotrigine level which was at 15.3    #Acute metabolic encephalopathy likely multifactorial: Improving but not back to baseline Etiology includes : Medication used,  chronic lithium toxicity, hypothyroidism with low TSH and elevated free T4, acute kidney injury, COVID-19 encephalopathy as well as delirium with possible underlying dementia # New diagnosis of epilepsy with LTM EEG showing left temporoparietal sharp waves #Drug-induced tremors  Recommendations -Started low-dose Keppra 250 mg twice daily, tolerating it well -Reduce lamotrigine 100 mg nightly, lithium was discontinued. -Psychiatry recommended low-dose Zoloft -Consider outpatient neuropsych eval for cognitive decline  Patient being discharged to [facility],  Neurology will available as needed. Patient will need to follow-up with her outpatient neurologist Dr. Ty Hilts Aroor [facility] Pager Number [phone] For questions after 7pm please refer to AMION to reach the Neurologist on call

## 2019-04-24 NOTE — Progress Notes (Signed)
PROGRESS NOTE    Tiffany [name]  OMA:[id number] DOB: 01-Jul-1950 DOA: 04/21/2019 PCP: Leamon Arnt, MD   Brief Narrative: 69 year old with past medical history significant for hypothyroidism, bipolar, psoriatic arthritis,  breast cancer s/p bilateral mastectomy 2016,  COVID-19 positive on 1/23.  She presents after having a fall the morning of admission, patient was not able to get up and family notes worsening confusion.  At baseline patient is able to ambulate without need of assistance and utilize a walker when traveling outside for long distances.  She is able to recognize family members, but get confused very frequently.  Her sertraline dose has been decreased over a week ago.  Patient was admitted with worsening confusion, gait disturbance and worsening chronic tremors, evaluation in the ED show white blood cell at 17 creatinine 2.7 lithium level 0.79, chest x-ray mild residual left basilar opacity thought to be atelectasis.  Assessment & Plan:   Principal Problem:   Acute metabolic encephalopathy Active Problems:   Acquired hypothyroidism   Bipolar affective disorder, mixed ([place])   Acute kidney injury superimposed on chronic kidney disease ([place])   Leukocytosis   Fall at home, initial encounter   Anxiety and depression   [id number]  1-Acute metabolic encephalopathy; Multifactorial : related to  medications (Chronic lithium toxicity, abnormal thyroid function, Epylepsi new diagnosis).  Confusion, gait disturbance, worsening chronic tremors. -No significant evidence for infection: chest x ray with atelectasis, Leukocytosis almost resolved. UA negative for infection.  -TSH low and free T 4 elevated. Synthroid was initially held. Elevated thyroid function could explain worsening tremors. Synthroid dose decreased.  -Lithium level was normal, per neurology lithium toxicity does not necessarily correlate with lithium level. -MRI negative for stroke. B 12 elevated. Ammonia normal.  -EEG showed  generalized 3-6 HZ theta delta slowing and left temporal parietal sharp transient. -Plan to continue with keppra.  -Psych was ok with stopping lithium. Resume Zoloft low dose. They recommend Perphenazine in substitution of lithium. I discussed with neurology who agree with not starting that medication due to increase risk of tardive dyskinesia. I spoke with Psych team, most antipsychotic has risk of tardive  dyskinesia.  -Patient tremors has improved, cognition with some improvement as well.  -Continue with Lamictal 100 mg at hs.  -needs neuropsych evaluation for cognitive decline.   History of tardive Dyskinesia;  Was on Ingrezza.  Hold per neurology.   Hypothyroidism;  TSH low at 0.095. Free T 4 elevated at 1.4  Free T 3 normal.  Resume lower dose synthroid, 112 mcg on 2-13. Monitor for worsening tremors.  Needs repeat TSH in 4 weeks.   Epilepsy;  New diagnosis.  Sharp transient on EEG.  Started on Keppra.   AKI; on CKD stage stage III Improved with IV fluids. Review records from care-everywhere prior cr at 1.8 GFR 29 in 2018  Stage III  Cr in our records at 2.2,  Stop IV fluids.  Needs referral to nephrology.  Monitor.   Bipolar, anxiety depression;  -On Ingrezza, Lamotrigine, sertraline and lithium prior to admission.  Patient was restarted on lower dose Lamotrigine 100 mg at hs and  Zoloft 25 mg daily.  Psych recommended Perphenazine, but due to increase risk of tardive dyskinesia will hold for now.  Per psych most antipsychotic have increase risk of tardive dyskinesia.  I will try to contact patient primary psychiatrist on Monday to further discussed options.  -discussed with neurology hold ingrezza.   Fall; mechanical fall, suspect related to weakness and tremors.  Needs CIR.   Leukocytosis; resolved.  WBC has decreased to 11. Pro-calcitonin 0.20 afebrile.  UA negative. ESR mildly elevated at 30./  Chest x ray atelectasis.  No indication for antibiotics.  Blood  culture no growth to date.   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  Dr Tobias Alexander is recommending patient to follow with movement disorder neurology specialist.   Tappen; persistently elevated. Will start low dose norvasc. Monitor for any adverse effect.   B 12 supplement. B 12 level elevated. Will give holiday off B 12 supplement. .  Mild thrombocytopenia; resolved  Oral thrush; started  nystatin.  Mild hypernatremia; change IV fluids to D 5. Resolved.    Estimated body mass index is 34.16 kg/m as calculated from the following:   Height as of this encounter: _0  (1.626 m).   Weight as of this encounter: 90.3 kg.   DVT prophylaxis: Heparin  Code Status: Full code Family Communication: Care discussed with Husband.   Disposition Plan:  Patient is from: Home  Anticipated d/c date: CIR when insurance approved it. Patient is a good candidate for CIR, she will need continue physical therapy  to be able to reach prior independence level.  She is on multiple psychiatric medications, and will benefit from continue monitoring adverse effects from those medication.  She may require further adjustment of medication.  Her Synthroid was also recently adjusted.  If she  develop worsening tremors Synthroid may need to be adjusted again.  With her new diagnosis of seizure, will need to monitor for any recurrence.  Barriers to d/c or necessity for inpatient status: Monitor clinical status on recent medication adjustments.  Consultants:   Neurology   Procedures:   EEG  Antimicrobials:  none  Subjective: Alert, in good spirit. She was able to feed herself for first time in 2 weeks.  Her tremors has decreased.  She was able to sleep well last night.  She was able to tell me she moved from [facility] 6 months ago.  Cognition some improvement.   Objective: Vitals:   04/23/19 2333 04/24/19 0430 04/24/19 0900 04/24/19 1312  BP: (!) 154/81 (!) 120/54 (!) 170/68 (!)  163/78  Pulse: (!) 56 (!) 51 (!) 59 (!) 59  Resp: _1 Temp: 97.6 F (36.4 C) 97.6 F (36.4 C) 98.3 F (36.8 C) 98 F (36.7 C)  TempSrc: Oral Oral Oral Oral  SpO2: 95% 97% 98% 100%  Weight:      Height:        Intake/Output Summary (Last 24 hours) at 04/24/2019 1416 Last data filed at 04/24/2019 1020 Gross per 24 hour  Intake 1178.06 ml  Output 1875 ml  Net -696.94 ml   Filed Weights   04/21/19 0700  Weight: 90.3 kg    Examination:  General exam: Alert, good spirit. Follows command Respiratory system: CTA Cardiovascular system: S 1, S 2 RRR Gastrointestinal system: BS present, soft, nt Central nervous system:Alert, conversant follows command. Non focal. Minimal tremors.  Extremities: No edema     Data Reviewed: I have personally reviewed following labs and imaging studies  CBC: Recent Labs  Lab 04/21/19 0728 04/22/19 0258 04/23/19 0308 04/24/19 0248  WBC 17.8* 11.4* 11.7* 8.5  NEUTROABS 15.0*  --   --   --   HGB 13.7 12.9 12.6 12.4  HCT 44.5 41.1 40.3 39.4  MCV 95.9 95.4 96.0 95.9  PLT 215 181 144* 638   Basic Metabolic Panel: Recent Labs  Lab 04/21/19 0728 04/22/19 0258 04/23/19 0308 04/24/19 0248  NA 142 142 146* 141  K 3.5 3.2* 4.4 3.1*  CL 107 111 115* 109  CO2 24 20* 20* 23  GLUCOSE 107* 103* 92 89  BUN _0 CREATININE 2.71* 2.18* 1.86* 1.82*  CALCIUM 10.0 9.3 9.2 9.3   GFR: Estimated Creatinine Clearance: 31.3 mL/min (A) (by C-G formula based on SCr of 1.82 mg/dL (H)). Liver Function Tests: Recent Labs  Lab 04/21/19 0728  AST 27  ALT 21  ALKPHOS 103  BILITOT 1.2  PROT 7.2  ALBUMIN 3.9   No results for input(s): LIPASE, AMYLASE in the last 168 hours. Recent Labs  Lab 04/21/19 1435  AMMONIA 25   Coagulation Profile: No results for input(s): INR, PROTIME in the last 168 hours. Cardiac Enzymes: No results for input(s): CKTOTAL, CKMB, CKMBINDEX, TROPONINI in the last 168 hours. BNP (last 3 results) No results  for input(s): PROBNP in the last 8760 hours. HbA1C: No results for input(s): HGBA1C in the last 72 hours. CBG: No results for input(s): GLUCAP in the last 168 hours. Lipid Profile: No results for input(s): CHOL, HDL, LDLCALC, TRIG, CHOLHDL, LDLDIRECT in the last 72 hours. Thyroid Function Tests: Recent Labs    04/22/19 0838  FREET4 1.43*  T3FREE 2.4   Anemia Panel: Recent Labs    04/22/19 0838  VITAMINB12 1,565*   Sepsis Labs: Recent Labs  Lab 04/21/19 1057  PROCALCITON 0.20    Recent Results (from the past 240 hour(s))  Culture, blood (routine x 2)     Status: None (Preliminary result)   Collection Time: 04/21/19 10:59 AM   Specimen: BLOOD  Result Value Ref Range Status   Specimen Description BLOOD RIGHT ANTECUBITAL  Final   Special Requests   Final    BOTTLES DRAWN AEROBIC AND ANAEROBIC Blood Culture adequate volume   Culture   Final    NO GROWTH 2 DAYS Performed at [facility], [place] [address] [place], [place] [address]    Report Status PENDING  Incomplete  Culture, blood (routine x 2)     Status: None (Preliminary result)   Collection Time: 04/21/19  2:35 PM   Specimen: BLOOD RIGHT HAND  Result Value Ref Range Status   Specimen Description BLOOD RIGHT HAND  Final   Special Requests   Final    BOTTLES DRAWN AEROBIC AND ANAEROBIC Blood Culture results may not be optimal due to an inadequate volume of blood received in culture bottles   Culture   Final    NO GROWTH 2 DAYS Performed at [facility], [place] [address], [place], [place] [address]    Report Status PENDING  Incomplete         Radiology Studies: No results found.      Scheduled Meds: . aspirin EC  81 mg Oral Daily  . calcitRIOL  0.25 mcg Oral Q M,W,F  . clonazePAM  0.5 mg Oral QHS  . heparin  5,000 Units Subcutaneous Q8H  . lamoTRIgine  100 mg Oral QHS  . levETIRAcetam  250 mg Oral BID  . levothyroxine  112 mcg Oral Q0600  . nystatin  5 mL Oral QID  . polyethylene glycol   17 g Oral Daily  . sertraline  25 mg Oral Daily  . sodium bicarbonate  650 mg Oral Daily  . sodium chloride flush  3 mL Intravenous Q12H   Continuous Infusions:    LOS: 3 days  Time spent: 35 minutes    Jeffrie Stander A Tyrell Antonio, MD [facility]   If 7PM-7AM, please contact night-coverage [email or web address]  04/24/2019, 2:16 PM

## 2019-04-25 DIAGNOSIS — U071 COVID-19: Secondary | ICD-10-CM

## 2019-04-25 LAB — BASIC METABOLIC PANEL
Anion gap: 11 (ref 5–15)
BUN: 9 mg/dL (ref 8–23)
CO2: 23 mmol/L (ref 22–32)
Calcium: 9.3 mg/dL (ref 8.9–10.3)
Chloride: 110 mmol/L (ref 98–111)
Creatinine, Ser: 1.79 mg/dL — ABNORMAL HIGH (ref 0.44–1.00)
GFR calc Af Amer: 33 mL/min — ABNORMAL LOW (ref >60)
GFR calc non Af Amer: 28 mL/min — ABNORMAL LOW (ref >60)
Glucose, Bld: 97 mg/dL (ref 70–99)
Potassium: 3.6 mmol/L (ref 3.5–5.1)
Sodium: 144 mmol/L (ref 135–145)

## 2019-04-25 LAB — MAGNESIUM: Magnesium: 1.9 mg/dL (ref 1.7–2.4)

## 2019-04-25 LAB — VITAMIN D 25 HYDROXY (VIT D DEFICIENCY, FRACTURES): Vit D, 25-Hydroxy: 41.27 ng/mL (ref 30–100)

## 2019-04-25 MED ORDER — LORAZEPAM 0.5 MG PO TABS: 0.5000 mg | ORAL_TABLET | Freq: Every day | ORAL | Status: DC | PRN

## 2019-04-25 MED ORDER — LOPERAMIDE HCL 2 MG PO CAPS
2.0000 mg | ORAL_CAPSULE | ORAL | Status: DC | PRN
  Administered 2019-04-25 – 2019-04-26 (×2): 2 mg via ORAL
  Filled 2019-04-25 (×2): qty 1

## 2019-04-25 MED ORDER — SODIUM BICARBONATE 650 MG PO TABS
650.0000 mg | ORAL_TABLET | Freq: Two times a day (BID) | ORAL | Status: DC
  Administered 2019-04-25 – 2019-04-26 (×2): 650 mg via ORAL
  Filled 2019-04-25 (×2): qty 1

## 2019-04-25 MED ORDER — SODIUM CHLORIDE 0.45 % IV SOLN: INTRAVENOUS | Status: DC

## 2019-04-25 NOTE — Consult Note (Addendum)
Telepsych Consultation   Reason for Consult: ''medication management, getting more anxious and crying.'' Referring Physician:  Niel Hummer, MD Location of Patient:  [id number] Location of Provider: [facility]  Patient Identification: Tiffany [name] MRN:  [id number] Principal Diagnosis: Acute metabolic encephalopathy Diagnosis:  Principal Problem:   Acute metabolic encephalopathy Active Problems:   Acquired hypothyroidism   Bipolar affective disorder, mixed ([place])   Acute kidney injury superimposed on chronic kidney disease ([place])   Leukocytosis   Fall at home, initial encounter   Anxiety and depression   COVID-19   Total Time spent with patient: 30 minutes  Subjective:   ''I was having panic attacks this morning but I am doing fine now. I just don't want to be placed on multiple medications.'' Objective: 69 y.o. female assessed via tele health for medication management follow up appointment requested by her hospitalist. Patient reports that she is doing better at the moment but has been getting more anxious every evenings and sometimes has difficulty falling asleep. She denies psychosis, delusions or self harming thoughts. Information obtained from hospitalist revealed that she spoke with patient Neurologist who wants patient off Sertraline for now but will continue Lamictal. Hospitalist placed patient on low dose of Lorazepam as needed for anxiety today which seems to be working well for her.   Past Psychiatric History: Bipolar , depression  Risk to Self:   denies  Risk to Others:  denies Prior Inpatient Therapy:   Prior Outpatient Therapy:    Past Medical History:  Past Medical History:  Diagnosis Date  . Bipolar 1 disorder ([place])   . Depression 1987  . Parkinson's disease ([place]) 2012  . Psoriatic arthritis ([place])   . PTSD (post-traumatic stress disorder)     Past Surgical History:  Procedure Laterality Date  . ABDOMINAL HYSTERECTOMY  1987  . CHOLECYSTECTOMY   1979  . DILATION AND CURETTAGE OF UTERUS  1973  . MASTECTOMY Bilateral 09/19/2014  . TONSILLECTOMY  1970  . URETERAL REIMPLANTION Bilateral 1974   Family History:  Family History  Problem Relation Age of Onset  . Cancer Mother   . Cancer Sister   . Stroke Maternal Grandfather   . Diabetes Paternal Grandfather   . Cancer Sister    Family Psychiatric  History:  Social History:  Social History   Substance and Sexual Activity  Alcohol Use Never     Social History   Substance and Sexual Activity  Drug Use Never    Social History   Socioeconomic History  . Marital status: Married    Spouse name: Not on file  . Number of children: Not on file  . Years of education: Not on file  . Highest education level: Not on file  Occupational History  . Not on file  Tobacco Use  . Smoking status: Never Smoker  . Smokeless tobacco: Never Used  Substance and Sexual Activity  . Alcohol use: Never  . Drug use: Never  . Sexual activity: Not Currently  Other Topics Concern  . Not on file  Social History Narrative   Moved to area from [place] 08/2018   Social Determinants of Health   Financial Resource Strain:   . Difficulty of Paying Living Expenses: Not on file  Food Insecurity:   . Worried About Charity fundraiser in the Last Year: Not on file  . Ran Out of Food in the Last Year: Not on file  Transportation Needs:   . Lack of Transportation (Medical): Not on file  .  Lack of Transportation (Non-Medical): Not on file  Physical Activity:   . Days of Exercise per Week: Not on file  . Minutes of Exercise per Session: Not on file  Stress:   . Feeling of Stress : Not on file  Social Connections:   . Frequency of Communication with Friends and Family: Not on file  . Frequency of Social Gatherings with Friends and Family: Not on file  . Attends Religious Services: Not on file  . Active Member of Clubs or Organizations: Not on file  . Attends Archivist Meetings: Not  on file  . Marital Status: Not on file   Additional Social History:    Allergies:   Allergies  Allergen Reactions  . Aripiprazole Other (See Comments)    Parkinsonism     . Methotrexate Other (See Comments)    Hair loss, severe stomatitis    . Amoxicillin-Pot Clavulanate Other (See Comments)    Unknown reaction   . Cefdinir Other (See Comments)    Other reaction(s): Diarrhea Yeast infection and fever; negative c diff  . Etanercept Other (See Comments)    Headaches    . Exemestane Other (See Comments)    Suicidal thoughts with medication    . Fluoxetine Other (See Comments)    Parkinsonism  . Methylprednisolone Sodium Succ Other (See Comments)    Agitated mania  . Epinephrine Palpitations    tachycardia   . Nitrofurantoin Nausea And Vomiting and Rash         Labs:  Results for orders placed or performed during the hospital encounter of 04/21/19 (from the past 48 hour(s))  Basic metabolic panel     Status: Abnormal   Collection Time: 04/24/19  2:48 AM  Result Value Ref Range   Sodium 141 135 - 145 mmol/L   Potassium 3.1 (L) 3.5 - 5.1 mmol/L   Chloride 109 98 - 111 mmol/L   CO2 23 22 - 32 mmol/L   Glucose, Bld 89 70 - 99 mg/dL   BUN 11 8 - 23 mg/dL   Creatinine, Ser 1.82 (H) 0.44 - 1.00 mg/dL   Calcium 9.3 8.9 - 10.3 mg/dL   GFR calc non Af Amer 28 (L) >60 mL/min   GFR calc Af Amer 32 (L) >60 mL/min   Anion gap 9 5 - 15    Comment: Performed at [facility], [address] [place] [address]  CBC     Status: None   Collection Time: 04/24/19  2:48 AM  Result Value Ref Range   WBC 8.5 4.0 - 10.5 K/uL   RBC 4.11 3.87 - 5.11 MIL/uL   Hemoglobin 12.4 12.0 - 15.0 g/dL   HCT 39.4 36.0 - 46.0 %   MCV 95.9 80.0 - 100.0 fL   MCH 30.2 26.0 - 34.0 pg   MCHC 31.5 30.0 - 36.0 g/dL   RDW 14.2 11.5 - 15.5 %   Platelets 191 150 - 400 K/uL   nRBC 0.0 0.0 - 0.2 %    Comment: Performed at [facility], [place] [address], [place], [place] [address]   VITAMIN D 25 Hydroxy (Vit-D Deficiency, Fractures)     Status: None   Collection Time: 04/25/19  4:11 AM  Result Value Ref Range   Vit D, 25-Hydroxy 41.27 30 - 100 ng/mL    Comment: (NOTE) Vitamin D deficiency has been defined by the Institute of Medicine  and an Endocrine Society practice guideline as a level of serum 25-OH  vitamin D less than 20 ng/mL (1,2). The Endocrine Society went on to  further define vitamin D insufficiency as a level between 21 and 29  ng/mL (2). 1. IOM (Institute of Medicine). 2010. Dietary reference intakes for  calcium and D. [place]: The [employer]. 2. Holick MF, B[place], Bischoff-Ferrari HA, et al. Evaluation,  treatment, and prevention of vitamin D deficiency: an Endocrine  Society clinical practice guideline, JCEM. 2011 Jul; 96(7): 1911-30. Performed at [facility], [place] [address] [place], [place] [address]   Basic metabolic panel     Status: Abnormal   Collection Time: 04/25/19  4:11 AM  Result Value Ref Range   Sodium 144 135 - 145 mmol/L   Potassium 3.6 3.5 - 5.1 mmol/L   Chloride 110 98 - 111 mmol/L   CO2 23 22 - 32 mmol/L   Glucose, Bld 97 70 - 99 mg/dL   BUN 9 8 - 23 mg/dL   Creatinine, Ser 1.79 (H) 0.44 - 1.00 mg/dL   Calcium 9.3 8.9 - 10.3 mg/dL   GFR calc non Af Amer 28 (L) >60 mL/min   GFR calc Af Amer 33 (L) >60 mL/min   Anion gap 11 5 - 15    Comment: Performed at [facility] [address], [place], [place] [address]  Magnesium     Status: None   Collection Time: 04/25/19  4:11 AM  Result Value Ref Range   Magnesium 1.9 1.7 - 2.4 mg/dL    Comment: Performed at [facility] [address] [place],  [address]    Medications:  Current Facility-Administered Medications  Medication Dose Route Frequency Provider Last Rate Last Admin  . 0.45 % sodium chloride infusion   Intravenous Continuous Regalado, Belkys A, MD      . acetaminophen (TYLENOL) tablet 650 mg  650 mg Oral Q6H  PRN Norval Morton, MD       Or  . acetaminophen (TYLENOL) suppository 650 mg  650 mg Rectal Q6H PRN Smith, Rondell A, MD      . albuterol (PROVENTIL) (2.5 MG/3ML) 0.083% nebulizer solution 2.5 mg  2.5 mg Nebulization Q6H PRN Smith, Rondell A, MD      . amLODipine (NORVASC) tablet 2.5 mg  2.5 mg Oral Daily Regalado, Belkys A, MD   2.5 mg at 04/25/19 1046  . aspirin EC tablet 81 mg  81 mg Oral Daily Greta Doom, MD   81 mg at 04/25/19 1046  . calcitRIOL (ROCALTROL) capsule 0.25 mcg  0.25 mcg Oral Q M,W,F Tamala Julian, Rondell A, MD   0.25 mcg at 04/23/19 0859  . clonazePAM (KLONOPIN) tablet 0.5 mg  0.5 mg Oral QHS Smith, Rondell A, MD   0.5 mg at 04/24/19 2150  . heparin injection 5,000 Units  5,000 Units Subcutaneous Q8H Smith, Rondell A, MD   5,000 Units at 04/25/19 1440  . hydrALAZINE (APRESOLINE) tablet 10 mg  10 mg Oral Q8H PRN Regalado, Belkys A, MD   10 mg at 04/23/19 0439  . lamoTRIgine (LAMICTAL) tablet 100 mg  100 mg Oral QHS Lora Havens, MD   100 mg at 04/24/19 2150  . levETIRAcetam (KEPPRA) tablet 250 mg  250 mg Oral BID Lora Havens, MD   250 mg at 04/25/19 1046  . lidocaine (LIDODERM) 5 % 1 patch  1 patch Transdermal Daily PRN Smith, Rondell A, MD      . loperamide (IMODIUM) capsule 2 mg  2 mg  Oral PRN Regalado, Belkys A, MD   2 mg at 04/25/19 1430  . LORazepam (ATIVAN) injection 1 mg  1 mg Intravenous Q6H PRN Regalado, Belkys A, MD      . LORazepam (ATIVAN) tablet 0.5 mg  0.5 mg Oral Daily PRN Regalado, Belkys A, MD      . nystatin (MYCOSTATIN) 100000 UNIT/ML suspension 500,000 Units  5 mL Oral QID Regalado, Belkys A, MD   500,000 Units at 04/25/19 1441  . ondansetron (ZOFRAN) tablet 4 mg  4 mg Oral Q6H PRN Fuller Plan A, MD       Or  . ondansetron (ZOFRAN) injection 4 mg  4 mg Intravenous Q6H PRN Smith, Rondell A, MD      . polyethylene glycol (MIRALAX / GLYCOLAX) packet 17 g  17 g Oral Daily Regalado, Belkys A, MD   17 g at 04/23/19 0901  . sodium bicarbonate  tablet 650 mg  650 mg Oral BID Regalado, Belkys A, MD      . sodium chloride flush (NS) 0.9 % injection 3 mL  3 mL Intravenous Q12H Smith, Rondell A, MD   3 mL at 04/25/19 1046    Musculoskeletal:  Psychiatric Specialty Exam: Physical Exam  Neurological: She is alert.  Psychiatric: She has a normal mood and affect. Her behavior is normal. Judgment and thought content normal. Her speech is delayed.    Review of Systems  Psychiatric/Behavioral: The patient is nervous/anxious.     Blood pressure (!) 158/88, pulse 70, temperature 97.6 F (36.4 C), temperature source Oral, resp. rate 18, height 5\' 4"  (1.626 m), weight 90.3 kg, SpO2 100 %.Body mass index is 34.16 kg/m.  General Appearance: Casual  Eye Contact:  Good  Speech:  Clear and Coherent  Volume:  Decreased  Mood:  Anxious  Affect:  Congruent  Thought Process:  Coherent  Orientation:  Full (Time, Place, and Person)  Thought Content:  Logical  Suicidal Thoughts:  No  Homicidal Thoughts:  No  Memory:  Immediate;   Fair Recent;   Fair  Judgement:  Fair  Insight:  Fair  Psychomotor Activity:  Psychomotor Retardation  Concentration:  Concentration: Fair  Recall:  [place] of Knowledge:  Good  Language:  Fair  Akathisia:  No  Handed:  Right  AIMS (if indicated):     Assets:  Communication Skills Desire for Improvement Resilience Social Support  ADL's:  Intact  Cognition:  WNL  Sleep:        Treatment Plan Summary: -Continue Lorazepam 0.5 mg daily as needed for anxiety attacks. -Continue Lamictal 100 mg qhs for mood. -Input by Neurology service will be appreciated.  Disposition: No evidence of imminent risk to self or others at present.   Patient does not meet criteria for psychiatric inpatient admission. Supportive therapy provided about ongoing stressors. Psychiatric service signing off. Re-consult as needed  This service was provided via telemedicine using a 2-way, interactive audio and video  technology.  Names of all persons participating in this telemedicine service and their role in this encounter. Name: Tiffany [name]  Role: patient  Name: Magnus Sinning Role: RN  Name: Corena Pilgrim, MD Role: Psychiatrist       Corena Pilgrim, MD 04/25/2019 3:59 PM

## 2019-04-25 NOTE — Progress Notes (Addendum)
PROGRESS NOTE    Tiffany [name]  EXB:[id number] DOB: 24-Nov-1950 DOA: 04/21/2019 PCP: Leamon Arnt, MD   Brief Narrative: 69 year old with past medical history significant for hypothyroidism, bipolar, psoriatic arthritis,  breast cancer s/p bilateral mastectomy 2016,  COVID-19 positive on 1/23.  She presents after having a fall the morning of admission, patient was not able to get up and family notes worsening confusion.  At baseline patient is able to ambulate without need of assistance and utilize a walker when traveling outside for long distances.  She is able to recognize family members, but get confused very frequently.  Her sertraline dose has been decreased over a week ago.  Patient was admitted with worsening confusion, gait disturbance and worsening chronic tremors, evaluation in the ED show white blood cell at 17 creatinine 2.7 lithium level 0.79, chest x-ray mild residual left basilar opacity thought to be atelectasis.  Assessment & Plan:   Principal Problem:   Acute metabolic encephalopathy Active Problems:   Acquired hypothyroidism   Bipolar affective disorder, mixed ([place])   Acute kidney injury superimposed on chronic kidney disease ([place])   Leukocytosis   Fall at home, initial encounter   Anxiety and depression   [id number]  1-Acute metabolic encephalopathy; Multifactorial : related to  medications (Chronic lithium toxicity, abnormal thyroid function, Epylepsi new diagnosis).  Confusion, gait disturbance, worsening chronic tremors. -No significant evidence for infection: chest x ray with atelectasis, Leukocytosis almost resolved. UA negative for infection.  -TSH low and free T 4 elevated. Synthroid was initially held. Elevated thyroid function could explain worsening tremors. Synthroid dose decreased. Tremors stable.  -Lithium level was normal, per neurology lithium toxicity does not necessarily correlate with lithium level. -MRI negative for stroke. B 12 elevated. Ammonia  normal.  -EEG showed generalized 3-6 HZ theta delta slowing and left temporal parietal sharp transient. -Plan to continue with keppra.  -Psych was ok with stopping lithium. Resume Zoloft low dose. Per psych we may use  Perphenazine in substitution of lithium. I discussed with neurology who agree with not starting that medication due to increase risk of tardive dyskinesia.  -Patient tremors has improved. -Continue with Lamictal 100 mg at hs.  -Needs neuropsych evaluation for cognitive decline out patient.   -patient report difficulty finding worlds last night, anxious.    Bipolar, anxiety depression;  -On Ingrezza, Lamotrigine, sertraline and lithium prior to admission.  -Patient was restarted on lower dose Lamotrigine 100 mg at hs and  Zoloft 25 mg daily.  -Psych recommended Perphenazine, but due to increase risk of tardive dyskinesia will hold for now.  -Discussed with neurology hold ingrezza.  -Patient with worsening anxiety, feeling sad, tearful, difficulty finding words last night. Discussed with neurology hold Zoloft. Psych consultation. Neurology will see in follow up.   History of tardive Dyskinesia;  Was on Ingrezza.  Hold per neurology.   Hypothyroidism;  TSH low at 0.095. Free T 4 elevated at 1.4  Free T 3 normal.  Resume lower dose synthroid, 112 mcg on 2-13. Monitor for worsening tremors.  Needs repeat TSH in 4 weeks.  Repeat free T 4 tomorrow.   Epilepsy;  New diagnosis.  Sharp transient on EEG.  Started on Keppra.   AKI; on CKD stage stage III Improved with IV fluids. Review records from care-everywhere prior cr at 1.8 GFR 29 in 2018  Stage III  Cr in our records at 2.2,  Stop IV fluids.  Needs referral to nephrology outpatient.   Stable.    Fall; mechanical  fall, suspect related to weakness and tremors.  Needs CIR.   Leukocytosis; resolved.  WBC has decreased to 11. Pro-calcitonin 0.20 afebrile.  UA negative. ESR mildly elevated at 30./  Chest x ray  atelectasis.  No indication for antibiotics.  Blood culture no growth to date.   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  Dr Tobias Alexander is recommending patient to follow with movement disorder neurology specialist.   [place]; persistently elevated. Started dose norvasc.    B 12 supplement. B 12 level elevated. Will give holiday off B 12 supplement. .  Mild thrombocytopenia; resolved  Oral thrush; started  nystatin.  Mild hypernatremia; change IV fluids to D 5. Resolved.    Estimated body mass index is 34.16 kg/m as calculated from the following:   Height as of this encounter: 5' 4"  (1.626 m).   Weight as of this encounter: 90.3 kg.   DVT prophylaxis: Heparin  Code Status: Full code Family Communication: Care discussed with Husband.   Disposition Plan:  Patient is from: Home  Anticipated d/c date: CIR when insurance approved it. Patient is a good candidate for CIR, she will need continue physical therapy  to be able to reach prior independence level.  She is on multiple psychiatric medications, and will benefit from continue monitoring adverse effects from those medication.  She may require further adjustment of medication.  Her Synthroid was also recently adjusted.  If she  develop worsening tremors Synthroid may need to be adjusted again.  With her new diagnosis of seizure, will need to monitor for any recurrence.  Barriers to d/c or necessity for inpatient status: Monitor clinical status on recent medication adjustments.  Consultants:   Neurology   Psych   Procedures:   EEG  Antimicrobials:  none  Subjective: Patient is not feeling well, had an episode last night of  difficulty finding words, staggering. She was feeling like if she was to lock down.  She is feeling anxious this morning as well. Tearful.  We talked about resuming her Ativan as needed, that she takes for anxiety. She feels that her tremors are improved.    Objective: Vitals:    04/25/19 0000 04/25/19 0348 04/25/19 0840 04/25/19 0842  BP: (!) 145/68 (!) 137/57 (!) 183/92 (!) 177/78  Pulse: (!) 48 (!) 58 78 80  Resp: 16 17 14    Temp: 98 F (36.7 C) (!) 97.4 F (36.3 C) (!) 97.3 F (36.3 C)   TempSrc: Oral Oral Oral   SpO2: 99% 95% 99% 97%  Weight:      Height:        Intake/Output Summary (Last 24 hours) at 04/25/2019 1003 Last data filed at 04/25/2019 0348 Gross per 24 hour  Intake --  Output 2525 ml  Net -2525 ml   Filed Weights   04/21/19 0700  Weight: 90.3 kg    Examination:  General exam: Alert, appears  sad Respiratory system: CTA Cardiovascular system: S 1, S 2 RRR Gastrointestinal system:  BS present, soft, nt Central nervous system: Alert, answering questions, no significant tremors Extremities: No edema     Data Reviewed: I have personally reviewed following labs and imaging studies  CBC: Recent Labs  Lab 04/21/19 0728 04/22/19 0258 04/23/19 0308 04/24/19 0248  WBC 17.8* 11.4* 11.7* 8.5  NEUTROABS 15.0*  --   --   --   HGB 13.7 12.9 12.6 12.4  HCT 44.5 41.1 40.3 39.4  MCV 95.9 95.4 96.0 95.9  PLT 215 181 144* 191  Basic Metabolic Panel: Recent Labs  Lab 04/21/19 0728 04/22/19 0258 04/23/19 0308 04/24/19 0248 04/25/19 0411  NA 142 142 146* 141 144  K 3.5 3.2* 4.4 3.1* 3.6  CL 107 111 115* 109 110  CO2 24 20* 20* 23 23  GLUCOSE 107* 103* 92 89 97  BUN 17 16 15 11 9   CREATININE 2.71* 2.18* 1.86* 1.82* 1.79*  CALCIUM 10.0 9.3 9.2 9.3 9.3  MG  --   --   --   --  1.9   GFR: Estimated Creatinine Clearance: 31.8 mL/min (A) (by C-G formula based on SCr of 1.79 mg/dL (H)). Liver Function Tests: Recent Labs  Lab 04/21/19 0728  AST 27  ALT 21  ALKPHOS 103  BILITOT 1.2  PROT 7.2  ALBUMIN 3.9   No results for input(s): LIPASE, AMYLASE in the last 168 hours. Recent Labs  Lab 04/21/19 1435  AMMONIA 25   Coagulation Profile: No results for input(s): INR, PROTIME in the last 168 hours. Cardiac Enzymes: No  results for input(s): CKTOTAL, CKMB, CKMBINDEX, TROPONINI in the last 168 hours. BNP (last 3 results) No results for input(s): PROBNP in the last 8760 hours. HbA1C: No results for input(s): HGBA1C in the last 72 hours. CBG: No results for input(s): GLUCAP in the last 168 hours. Lipid Profile: No results for input(s): CHOL, HDL, LDLCALC, TRIG, CHOLHDL, LDLDIRECT in the last 72 hours. Thyroid Function Tests: No results for input(s): TSH, T4TOTAL, FREET4, T3FREE, THYROIDAB in the last 72 hours. Anemia Panel: No results for input(s): VITAMINB12, FOLATE, FERRITIN, TIBC, IRON, RETICCTPCT in the last 72 hours. Sepsis Labs: Recent Labs  Lab 04/21/19 1057  PROCALCITON 0.20    Recent Results (from the past 240 hour(s))  Culture, blood (routine x 2)     Status: None (Preliminary result)   Collection Time: 04/21/19 10:59 AM   Specimen: BLOOD  Result Value Ref Range Status   Specimen Description BLOOD RIGHT ANTECUBITAL  Final   Special Requests   Final    BOTTLES DRAWN AEROBIC AND ANAEROBIC Blood Culture adequate volume   Culture   Final    NO GROWTH 3 DAYS Performed at [facility], [address] [place], [place] [address]    Report Status PENDING  Incomplete  Culture, blood (routine x 2)     Status: None (Preliminary result)   Collection Time: 04/21/19  2:35 PM   Specimen: BLOOD RIGHT HAND  Result Value Ref Range Status   Specimen Description BLOOD RIGHT HAND  Final   Special Requests   Final    BOTTLES DRAWN AEROBIC AND ANAEROBIC Blood Culture results may not be optimal due to an inadequate volume of blood received in culture bottles   Culture   Final    NO GROWTH 3 DAYS Performed at [facility], [address] [place], [place] [address]    Report Status PENDING  Incomplete         Radiology Studies: No results found.      Scheduled Meds: . amLODipine  2.5 mg Oral Daily  . aspirin EC  81 mg Oral Daily  . calcitRIOL  0.25 mcg Oral Q M,W,F  .  clonazePAM  0.5 mg Oral QHS  . heparin  5,000 Units Subcutaneous Q8H  . lamoTRIgine  100 mg Oral QHS  . levETIRAcetam  250 mg Oral BID  . levothyroxine  112 mcg Oral Q0600  . nystatin  5 mL Oral QID  . polyethylene glycol  17 g Oral  Daily  . sodium bicarbonate  650 mg Oral Daily  . sodium chloride flush  3 mL Intravenous Q12H   Continuous Infusions:    LOS: 4 days    Time spent: 35 minutes    Clotee Schlicker A Talon Regala, MD [facility]   If 7PM-7AM, please contact night-coverage [email or web address]  04/25/2019, 10:03 AM

## 2019-04-25 NOTE — Progress Notes (Signed)
Reason for consult: Altered mental status  Subjective: Patient yesterday and today was having panic attacks and was feeling anxious.  When assessing the patient this morning, she appeared alert.  She is oriented to place, month and year which is an improvement.  Her mentation was better than yesterday and she was able to recall her children and grandchildren's name.  She remembers specifics of some of the conversation we had yesterday.   ROS: negative except above  Examination  Vital signs in last 24 hours: Temp:  [97.3 F (36.3 C)-98 F (36.7 C)] 98 F (36.7 C) (02/14 1636) Pulse Rate:  [48-80] 57 (02/14 1636) Resp:  [14-19] 19 (02/14 1636) BP: (137-183)/(57-92) 149/73 (02/14 1636) SpO2:  [95 %-100 %] 100 % (02/14 1636)  General: sitting in commos CVS: pulse-normal rate and rhythm RS: breathing comfortably Extremities: normal   Neuro: MS: Alert, oriented x4, able to recall names of her children, the president. CN: pupils equal and reactive,  EOMI, face symmetric Gait: not tested  Basic Metabolic Panel: Recent Labs  Lab 04/21/19 0728 04/21/19 0728 04/22/19 0258 04/22/19 0258 04/23/19 0308 04/24/19 0248 04/25/19 0411  NA 142  --  142  --  146* 141 144  K 3.5  --  3.2*  --  4.4 3.1* 3.6  CL 107  --  111  --  115* 109 110  CO2 24  --  20*  --  20* 23 23  GLUCOSE 107*  --  103*  --  92 89 97  BUN 17  --  16  --  15 11 9   CREATININE 2.71*  --  2.18*  --  1.86* 1.82* 1.79*  CALCIUM 10.0   < > 9.3   < > 9.2 9.3 9.3  MG  --   --   --   --   --   --  1.9   < > = values in this interval not displayed.    CBC: Recent Labs  Lab 04/21/19 0728 04/22/19 0258 04/23/19 0308 04/24/19 0248  WBC 17.8* 11.4* 11.7* 8.5  NEUTROABS 15.0*  --   --   --   HGB 13.7 12.9 12.6 12.4  HCT 44.5 41.1 40.3 39.4  MCV 95.9 95.4 96.0 95.9  PLT 215 181 144* 191     Coagulation Studies: No results for input(s): LABPROT, INR in the last 72 hours.  Imaging Reviewed:     ASSESSMENT  AND PLAN  69 year old female with longstanding history of mood disorder with multiple psychiatric medications, drug-induced tremors presenting with worsening gait and confusion. Work-up included MRI brain, continues EEG monitoring, metabolic work-up including thyroid functions, B12 which was 1565 and lamotrigine level which was at 73.5   #Acute metabolic encephalopathy likely multifactorial: Improving but not back to baseline Etiology includes : Medication used,  chronic lithium toxicity, hypothyroidism with low TSH and elevated free T4, acute kidney injury, COVID-19 encephalopathy as well as delirium with possible underlying dementia # New diagnosis of epilepsy with LTM EEG showing left temporoparietal sharp waves #Drug-induced tremors  Neurology called back as patient felt anxious yesterday as well as today morning.  Reviewed her chart, she has been on sertraline for 2 days.  Recommended discontinuation to see how she does and to minimize medications as much as possible for now until she which is closer to her baseline. Ativan 0.5 mg as needed for anxiety has been started and continued by psychiatry   Recommendations -Started low-dose Keppra 250 mg twice daily, tolerating it well -  Reduce lamotrigine 100 mg nightly, lithium was discontinued. -Psychiatry recommended low-dose Zoloft, would actually d/c for now to minimize medications.  If needed, can be restarted in the future once patient's mental status improves. - Outpatient neuropsych eval   Patient being discharged to CIR Patient will need to follow-up with her outpatient neurologist Dr. Ty Hilts Zayna Toste [facility] Pager Number [phone] For questions after 7pm please refer to AMION to reach the Neurologist on call

## 2019-04-26 ENCOUNTER — Telehealth: Payer: Self-pay | Admitting: Family Medicine

## 2019-04-26 ENCOUNTER — Other Ambulatory Visit: Payer: Self-pay

## 2019-04-26 ENCOUNTER — Encounter (HOSPITAL_COMMUNITY): Payer: Self-pay | Admitting: Physical Medicine & Rehabilitation

## 2019-04-26 ENCOUNTER — Telehealth: Payer: Self-pay | Admitting: Adult Health

## 2019-04-26 ENCOUNTER — Inpatient Hospital Stay (HOSPITAL_COMMUNITY)
Admission: RE | Admit: 2019-04-26 | Discharge: 2019-05-05 | DRG: 884 | Disposition: A | Discharge status: Home / Self Care | Payer: Medicare Other | Source: Intra-hospital | Attending: Physical Medicine & Rehabilitation | Admitting: Physical Medicine & Rehabilitation

## 2019-04-26 ENCOUNTER — Encounter (HOSPITAL_COMMUNITY): Payer: Self-pay | Admitting: Internal Medicine

## 2019-04-26 DIAGNOSIS — R748 Abnormal levels of other serum enzymes: Secondary | ICD-10-CM | POA: Diagnosis not present

## 2019-04-26 DIAGNOSIS — R4189 Other symptoms and signs involving cognitive functions and awareness: Principal | ICD-10-CM | POA: Diagnosis present

## 2019-04-26 DIAGNOSIS — Z833 Family history of diabetes mellitus: Secondary | ICD-10-CM | POA: Diagnosis not present

## 2019-04-26 DIAGNOSIS — I129 Hypertensive chronic kidney disease with stage 1 through stage 4 chronic kidney disease, or unspecified chronic kidney disease: Secondary | ICD-10-CM | POA: Diagnosis present

## 2019-04-26 DIAGNOSIS — E870 Hyperosmolality and hypernatremia: Secondary | ICD-10-CM | POA: Diagnosis present

## 2019-04-26 DIAGNOSIS — Z853 Personal history of malignant neoplasm of breast: Secondary | ICD-10-CM | POA: Diagnosis not present

## 2019-04-26 DIAGNOSIS — F4310 Post-traumatic stress disorder, unspecified: Secondary | ICD-10-CM | POA: Diagnosis present

## 2019-04-26 DIAGNOSIS — Z9013 Acquired absence of bilateral breasts and nipples: Secondary | ICD-10-CM

## 2019-04-26 DIAGNOSIS — Z79890 Hormone replacement therapy (postmenopausal): Secondary | ICD-10-CM

## 2019-04-26 DIAGNOSIS — K529 Noninfective gastroenteritis and colitis, unspecified: Secondary | ICD-10-CM | POA: Diagnosis present

## 2019-04-26 DIAGNOSIS — Z888 Allergy status to other drugs, medicaments and biological substances status: Secondary | ICD-10-CM | POA: Diagnosis not present

## 2019-04-26 DIAGNOSIS — R519 Headache, unspecified: Secondary | ICD-10-CM

## 2019-04-26 DIAGNOSIS — Z823 Family history of stroke: Secondary | ICD-10-CM | POA: Diagnosis not present

## 2019-04-26 DIAGNOSIS — G9340 Encephalopathy, unspecified: Secondary | ICD-10-CM | POA: Diagnosis present

## 2019-04-26 DIAGNOSIS — R569 Unspecified convulsions: Secondary | ICD-10-CM | POA: Diagnosis not present

## 2019-04-26 DIAGNOSIS — B948 Sequelae of other specified infectious and parasitic diseases: Secondary | ICD-10-CM | POA: Diagnosis not present

## 2019-04-26 DIAGNOSIS — G2111 Neuroleptic induced parkinsonism: Secondary | ICD-10-CM | POA: Diagnosis present

## 2019-04-26 DIAGNOSIS — N179 Acute kidney failure, unspecified: Secondary | ICD-10-CM | POA: Diagnosis present

## 2019-04-26 DIAGNOSIS — Z7982 Long term (current) use of aspirin: Secondary | ICD-10-CM

## 2019-04-26 DIAGNOSIS — G479 Sleep disorder, unspecified: Secondary | ICD-10-CM | POA: Diagnosis not present

## 2019-04-26 DIAGNOSIS — G20 Parkinson's disease: Secondary | ICD-10-CM | POA: Diagnosis present

## 2019-04-26 DIAGNOSIS — F319 Bipolar disorder, unspecified: Secondary | ICD-10-CM | POA: Diagnosis present

## 2019-04-26 DIAGNOSIS — I10 Essential (primary) hypertension: Secondary | ICD-10-CM

## 2019-04-26 DIAGNOSIS — N2581 Secondary hyperparathyroidism of renal origin: Secondary | ICD-10-CM | POA: Diagnosis present

## 2019-04-26 DIAGNOSIS — L4050 Arthropathic psoriasis, unspecified: Secondary | ICD-10-CM | POA: Diagnosis present

## 2019-04-26 DIAGNOSIS — N183 Chronic kidney disease, stage 3 unspecified: Secondary | ICD-10-CM | POA: Diagnosis present

## 2019-04-26 DIAGNOSIS — Z809 Family history of malignant neoplasm, unspecified: Secondary | ICD-10-CM

## 2019-04-26 DIAGNOSIS — R531 Weakness: Secondary | ICD-10-CM | POA: Diagnosis present

## 2019-04-26 DIAGNOSIS — Z79899 Other long term (current) drug therapy: Secondary | ICD-10-CM | POA: Diagnosis not present

## 2019-04-26 DIAGNOSIS — F3130 Bipolar disorder, current episode depressed, mild or moderate severity, unspecified: Secondary | ICD-10-CM | POA: Diagnosis present

## 2019-04-26 DIAGNOSIS — F3170 Bipolar disorder, currently in remission, most recent episode unspecified: Secondary | ICD-10-CM | POA: Diagnosis not present

## 2019-04-26 DIAGNOSIS — R7401 Elevation of levels of liver transaminase levels: Secondary | ICD-10-CM

## 2019-04-26 DIAGNOSIS — G9349 Other encephalopathy: Secondary | ICD-10-CM | POA: Diagnosis present

## 2019-04-26 DIAGNOSIS — M25552 Pain in left hip: Secondary | ICD-10-CM | POA: Diagnosis not present

## 2019-04-26 DIAGNOSIS — F410 Panic disorder [episodic paroxysmal anxiety] without agoraphobia: Secondary | ICD-10-CM | POA: Diagnosis present

## 2019-04-26 DIAGNOSIS — E039 Hypothyroidism, unspecified: Secondary | ICD-10-CM | POA: Diagnosis present

## 2019-04-26 DIAGNOSIS — G9341 Metabolic encephalopathy: Secondary | ICD-10-CM

## 2019-04-26 DIAGNOSIS — G249 Dystonia, unspecified: Secondary | ICD-10-CM | POA: Diagnosis present

## 2019-04-26 DIAGNOSIS — D696 Thrombocytopenia, unspecified: Secondary | ICD-10-CM | POA: Diagnosis present

## 2019-04-26 LAB — BASIC METABOLIC PANEL
Anion gap: 12 (ref 5–15)
BUN: 9 mg/dL (ref 8–23)
CO2: 23 mmol/L (ref 22–32)
Calcium: 9.1 mg/dL (ref 8.9–10.3)
Chloride: 109 mmol/L (ref 98–111)
Creatinine, Ser: 1.77 mg/dL — ABNORMAL HIGH (ref 0.44–1.00)
GFR calc Af Amer: 33 mL/min — ABNORMAL LOW (ref >60)
GFR calc non Af Amer: 29 mL/min — ABNORMAL LOW (ref >60)
Glucose, Bld: 115 mg/dL — ABNORMAL HIGH (ref 70–99)
Potassium: 3.5 mmol/L (ref 3.5–5.1)
Sodium: 144 mmol/L (ref 135–145)

## 2019-04-26 LAB — T4, FREE: Free T4: 1.01 ng/dL (ref 0.61–1.12)

## 2019-04-26 LAB — CULTURE, BLOOD (ROUTINE X 2)
Culture: NO GROWTH
Culture: NO GROWTH
Special Requests: ADEQUATE

## 2019-04-26 MED ORDER — HEPARIN SODIUM (PORCINE) 5000 UNIT/ML IJ SOLN: 5000.0000 [IU] | Freq: Three times a day (TID) | INTRAMUSCULAR | Status: DC

## 2019-04-26 MED ORDER — FLEET ENEMA 7-19 GM/118ML RE ENEM: 1.0000 | ENEMA | Freq: Once | RECTAL | Status: DC | PRN

## 2019-04-26 MED ORDER — ONDANSETRON HCL 4 MG PO TABS
4.0000 mg | ORAL_TABLET | Freq: Four times a day (QID) | ORAL | Status: DC | PRN
  Administered 2019-04-27: 4 mg via ORAL
  Filled 2019-04-26: qty 1

## 2019-04-26 MED ORDER — DIPHENHYDRAMINE HCL 12.5 MG/5ML PO ELIX: 12.5000 mg | ORAL_SOLUTION | Freq: Four times a day (QID) | ORAL | Status: DC | PRN

## 2019-04-26 MED ORDER — NYSTATIN 100000 UNIT/ML MT SUSP
5.0000 mL | Freq: Four times a day (QID) | OROMUCOSAL | Status: DC
  Administered 2019-04-26 – 2019-05-03 (×24): 500000 [IU] via ORAL
  Filled 2019-04-26 (×26): qty 5

## 2019-04-26 MED ORDER — ALUM & MAG HYDROXIDE-SIMETH 200-200-20 MG/5ML PO SUSP: 30.0000 mL | ORAL | Status: DC | PRN

## 2019-04-26 MED ORDER — LAMOTRIGINE 100 MG PO TABS: 100.0000 mg | ORAL_TABLET | Freq: Every day | ORAL | 0 refills | Status: DC

## 2019-04-26 MED ORDER — LIDOCAINE 5 % EX PTCH: 1.0000 | MEDICATED_PATCH | Freq: Every day | CUTANEOUS | Status: DC | PRN

## 2019-04-26 MED ORDER — LEVETIRACETAM 250 MG PO TABS: 250.0000 mg | ORAL_TABLET | Freq: Two times a day (BID) | ORAL | 0 refills | Status: DC

## 2019-04-26 MED ORDER — LEVETIRACETAM 250 MG PO TABS
250.0000 mg | ORAL_TABLET | Freq: Two times a day (BID) | ORAL | Status: DC
  Administered 2019-04-26 – 2019-05-05 (×18): 250 mg via ORAL
  Filled 2019-04-26 (×18): qty 1

## 2019-04-26 MED ORDER — LEVOTHYROXINE SODIUM 112 MCG PO TABS
112.0000 ug | ORAL_TABLET | Freq: Every day | ORAL | Status: DC
  Administered 2019-04-27 – 2019-05-05 (×9): 112 ug via ORAL
  Filled 2019-04-26 (×9): qty 1

## 2019-04-26 MED ORDER — CLONAZEPAM 0.5 MG PO TABS
0.5000 mg | ORAL_TABLET | Freq: Every day | ORAL | Status: DC
  Administered 2019-04-26 – 2019-05-04 (×9): 0.5 mg via ORAL
  Filled 2019-04-26 (×9): qty 1

## 2019-04-26 MED ORDER — ASPIRIN EC 81 MG PO TBEC
81.0000 mg | DELAYED_RELEASE_TABLET | Freq: Every day | ORAL | Status: DC
  Filled 2019-04-26: qty 1

## 2019-04-26 MED ORDER — LORAZEPAM 2 MG/ML IJ SOLN: 1.0000 mg | Freq: Four times a day (QID) | INTRAMUSCULAR | Status: DC | PRN

## 2019-04-26 MED ORDER — GUAIFENESIN-DM 100-10 MG/5ML PO SYRP: 5.0000 mL | ORAL_SOLUTION | Freq: Four times a day (QID) | ORAL | Status: DC | PRN

## 2019-04-26 MED ORDER — LOPERAMIDE HCL 2 MG PO CAPS: 2.0000 mg | ORAL_CAPSULE | ORAL | 0 refills | Status: DC | PRN

## 2019-04-26 MED ORDER — POLYETHYLENE GLYCOL 3350 17 G PO PACK: 17.0000 g | PACK | Freq: Every day | ORAL | Status: DC | PRN

## 2019-04-26 MED ORDER — ALBUTEROL SULFATE (2.5 MG/3ML) 0.083% IN NEBU: 2.5000 mg | INHALATION_SOLUTION | Freq: Four times a day (QID) | RESPIRATORY_TRACT | Status: DC | PRN

## 2019-04-26 MED ORDER — HEPARIN SODIUM (PORCINE) 5000 UNIT/ML IJ SOLN
5000.0000 [IU] | Freq: Three times a day (TID) | INTRAMUSCULAR | Status: DC
  Administered 2019-04-26 – 2019-05-05 (×26): 5000 [IU] via SUBCUTANEOUS
  Filled 2019-04-26 (×26): qty 1

## 2019-04-26 MED ORDER — ONDANSETRON HCL 4 MG/2ML IJ SOLN: 4.0000 mg | Freq: Four times a day (QID) | INTRAMUSCULAR | Status: DC | PRN

## 2019-04-26 MED ORDER — LEVOTHYROXINE SODIUM 112 MCG PO TABS: 112.0000 ug | ORAL_TABLET | Freq: Every day | ORAL | 0 refills | Status: DC

## 2019-04-26 MED ORDER — LAMOTRIGINE 100 MG PO TABS
100.0000 mg | ORAL_TABLET | Freq: Every day | ORAL | Status: DC
  Administered 2019-04-26 – 2019-05-04 (×9): 100 mg via ORAL
  Filled 2019-04-26 (×9): qty 1

## 2019-04-26 MED ORDER — AMLODIPINE BESYLATE 2.5 MG PO TABS
2.5000 mg | ORAL_TABLET | Freq: Every day | ORAL | Status: DC
  Administered 2019-04-30 – 2019-05-02 (×2): 2.5 mg via ORAL
  Filled 2019-04-26 (×9): qty 1

## 2019-04-26 MED ORDER — CALCITRIOL 0.25 MCG PO CAPS
0.2500 ug | ORAL_CAPSULE | ORAL | Status: DC
  Administered 2019-04-28 – 2019-05-05 (×4): 0.25 ug via ORAL
  Filled 2019-04-26 (×4): qty 1

## 2019-04-26 MED ORDER — AMLODIPINE BESYLATE 2.5 MG PO TABS: 2.5000 mg | ORAL_TABLET | Freq: Every day | ORAL | 0 refills | Status: DC

## 2019-04-26 MED ORDER — ACETAMINOPHEN 325 MG PO TABS
325.0000 mg | ORAL_TABLET | ORAL | Status: DC | PRN
  Administered 2019-04-28 – 2019-05-02 (×5): 650 mg via ORAL
  Filled 2019-04-26 (×5): qty 2

## 2019-04-26 MED ORDER — LORAZEPAM 0.5 MG PO TABS: 0.5000 mg | ORAL_TABLET | Freq: Every day | ORAL | Status: DC | PRN

## 2019-04-26 MED ORDER — SODIUM BICARBONATE 650 MG PO TABS
650.0000 mg | ORAL_TABLET | Freq: Two times a day (BID) | ORAL | Status: DC
  Administered 2019-04-26 – 2019-05-05 (×18): 650 mg via ORAL
  Filled 2019-04-26 (×18): qty 1

## 2019-04-26 MED ORDER — LEVOTHYROXINE SODIUM 112 MCG PO TABS
112.0000 ug | ORAL_TABLET | Freq: Every day | ORAL | Status: DC
  Administered 2019-04-26: 112 ug via ORAL
  Filled 2019-04-26: qty 1

## 2019-04-26 MED ORDER — CLONAZEPAM 0.5 MG PO TABS: 0.5000 mg | ORAL_TABLET | Freq: Every day | ORAL | 0 refills | Status: DC

## 2019-04-26 MED ORDER — LOPERAMIDE HCL 2 MG PO CAPS
2.0000 mg | ORAL_CAPSULE | Freq: Every day | ORAL | Status: DC | PRN
  Administered 2019-05-01 – 2019-05-05 (×4): 2 mg via ORAL
  Filled 2019-04-26 (×4): qty 1

## 2019-04-26 MED ORDER — LORAZEPAM 0.5 MG PO TABS
0.5000 mg | ORAL_TABLET | Freq: Every day | ORAL | Status: DC | PRN
  Administered 2019-05-02 – 2019-05-03 (×2): 0.5 mg via ORAL
  Filled 2019-04-26 (×2): qty 1

## 2019-04-26 MED ORDER — BISACODYL 10 MG RE SUPP: 10.0000 mg | Freq: Every day | RECTAL | Status: DC | PRN

## 2019-04-26 MED ORDER — ASPIRIN 81 MG PO TBEC: 81.0000 mg | DELAYED_RELEASE_TABLET | Freq: Every day | ORAL | 0 refills | Status: DC

## 2019-04-26 NOTE — Telephone Encounter (Signed)
Dr. Tyrell Antonio calling to consult with [facility]

## 2019-04-26 NOTE — Progress Notes (Signed)
Report called to [facility], patient to go to room 4W25.

## 2019-04-26 NOTE — Telephone Encounter (Signed)
Please cancel appt. I'm aware of the hospitalization.  No need to forward these requests to me. Thanks!

## 2019-04-26 NOTE — Telephone Encounter (Signed)
Pt husband called to cancel appointment for 04/27/2019 due to pt being hospitalized. Husband states pt fell last Wednesday and was admitted to the hospital.

## 2019-04-26 NOTE — Telephone Encounter (Signed)
Did he leave a phone number?

## 2019-04-26 NOTE — H&P (Signed)
Physical Medicine and Rehabilitation Admission H&P    Chief Complaint  Patient presents with  . 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  Family reported waxing and waning of confusion that's usually worse at nights and patient with fall with concerns of stroke therefore admitted for work up.  Patient moved from [place] 08/2018 (Son with [facility]) and has history of intermittent lithium toxicity.  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 Dr. Leonel Ramsay consulted and expressed concern of seizures as well as lithium toxicity. Encephalopathy seen in setting of Covid and would likely take weeks/months to recover. Lithium level-0.87 and Lamictal- 15.3. TSH-0.095/T3-2.4/T4-1.43. Synthroid and Lamictal held as question contributing to tremors/encephalopathy. Noted to have leucocytosis at admission with WBC-17.8--> BC/UA negative and acute on chronic renal failure with SCr-2.67.   LT EEG showed left temporoparietal sharp waves in addition to mild to moderate diffuse encephalopathy. Low dose Keppra added in addition to resuming lower dose Lamictal. Acute on chronic renal failure with hypernatremia has resolved with D5. As T4 levels down to 1.4-->synthroid resumed at lower dose on 2/13. Psychiatry consulted for input on medications changes to help manage anxiety and depression. Lamictal decreased to 100 mg/HS and addition of low dose Sertraline. Neurology however recommended discontinuation of latter as mentation improving and to minimize medications. To use lorazepam prn for anxiety/panic attacks. .  Tremors have improved and patient to follow up  with Dr. Carles Collet after discharge. Therapy evaluations completed revealing balance deficits with significant weakness, cognitive deficits with delayed processing and poor awareness of deficits. CIR recommended due to recent functional decline.     ROS Patient denies pain. Says dyskinesias are much improved, and she is now able to eat independently. Having multiple BM per day and was started on Imodium. Has been sleeping well at night except for last night when she was conscious of not dislodging her right IV and so had difficulty sleeping.    Past Medical History:  Diagnosis Date  . Bipolar 1 disorder ([place])   . Breast cancer ([place])   . Chronic diarrhea    loose stools twice a day on average for years  . Chronic kidney disease (CKD), stage III (moderate)   . Depression 1987  . Parkinson's disease ([place]) 2012  . Psoriatic arthritis ([place])   . PTSD (post-traumatic stress disorder)   . Secondary hyperparathyroidism ([place])   . Tardive dyskinesia     Past Surgical History:  Procedure Laterality Date  . ABDOMINAL HYSTERECTOMY  1987  . CHOLECYSTECTOMY  1979  . DILATION AND CURETTAGE OF UTERUS  1973  . MASTECTOMY Bilateral 09/19/2014  . TONSILLECTOMY  1970  . URETERAL REIMPLANTION Bilateral 1974    Family History  Problem Relation Age of Onset  . Cancer Mother   . Cancer Sister   . Stroke Maternal Grandfather   . Diabetes Paternal Grandfather   . Cancer Sister     Social History:  Married. Was independent prior to Covid. Per  reports that she has never smoked. She has never used smokeless tobacco. She reports that she does not drink alcohol or use drugs.    Allergies  Allergen Reactions  . Aripiprazole  Other (See Comments)    Parkinsonism     . Methotrexate Other (See Comments)    Hair loss, severe stomatitis    . Amoxicillin-Pot Clavulanate Other (See Comments)    Unknown reaction   . Cefdinir Other (See Comments)    Other reaction(s): Diarrhea Yeast infection and fever;  negative c diff  . Etanercept Other (See Comments)    Headaches    . Exemestane Other (See Comments)    Suicidal thoughts with medication    . Fluoxetine Other (See Comments)    Parkinsonism  . Methylprednisolone Sodium Succ Other (See Comments)    Agitated mania  . Epinephrine Palpitations    tachycardia   . Nitrofurantoin Nausea And Vomiting and Rash         Medications Prior to Admission  Medication Sig Dispense Refill  . acetaminophen (TYLENOL) 500 MG tablet Take 500 mg by mouth daily as needed for headache (pain).     Marland Kitchen amLODipine (NORVASC) 2.5 MG tablet Take 1 tablet (2.5 mg total) by mouth daily. 30 tablet 0  . aspirin EC 81 MG EC tablet Take 1 tablet (81 mg total) by mouth daily. 30 tablet 0  . AZELAIC ACID EX Apply 1 application topically daily. 15% gel, apply to face.    . calcitRIOL (ROCALTROL) 0.25 MCG capsule Take 0.25 mcg by mouth every Monday, Wednesday, and Friday.     . Certolizumab Pegol (CIMZIA) 2 X 200 MG KIT Inject 200 mg into the skin every 14 (fourteen) days. Administered by [facility] - for psoriatic arthritis     . Cholecalciferol 50 MCG (2000 UT) TABS Take 1-2 tablets by mouth See admin instructions. Take 1 tablet by mouth 1st day, then take 2 tablets by mouth 2nd day, then repeat    . clonazePAM (KLONOPIN) 0.5 MG tablet Take 1 tablet (0.5 mg total) by mouth at bedtime. 30 tablet 0  . lamoTRIgine (LAMICTAL) 100 MG tablet Take 1 tablet (100 mg total) by mouth at bedtime. 30 tablet 0  . levETIRAcetam (KEPPRA) 250 MG tablet Take 1 tablet (250 mg total) by mouth 2 (two) times daily. 60 tablet 0  . levothyroxine (SYNTHROID) 112 MCG tablet Take 1 tablet (112 mcg total) by mouth daily at 6 (six) AM. 30 tablet 0  . Lidocaine 4 % PTCH Apply 1 patch topically See admin instructions. Apply up to 3 times daily as needed for pain.    Marland Kitchen loperamide (IMODIUM) 2 MG capsule Take 1 capsule (2 mg total) by mouth as needed for diarrhea or loose stools. 30 capsule 0   . LORazepam (ATIVAN) 0.5 MG tablet Take 1 tablet (0.5 mg total) by mouth daily as needed. (Patient taking differently: Take 0.5 mg by mouth daily as needed for anxiety. ) 30 tablet 2  . sodium bicarbonate 650 MG tablet Take 650 mg by mouth 2 (two) times daily.      Drug Regimen Review  Drug regimen was reviewed and remains appropriate with no significant issues identified  Home: Home Living Family/patient expects to be discharged to:: Private residence Living Arrangements: Spouse/significant other, Children(daughter) Available Help at Discharge: Family, Available 24 hours/day Type of Home: House Home Access: Stairs to enter [employer] of Steps: 3 Home Layout: One level Bathroom Shower/Tub: Multimedia programmer: Standard Home Equipment: Environmental consultant - 4 wheels, Shower seat, Grab bars - tub/shower   Functional History: Prior Function Level of Independence: Needs assistance Gait / Transfers Assistance Needed: uses Rollator for community ambulation, furniture walks indoors ADL's /  Homemaking Assistance Needed: In the last couple of weeks, pt requiring increased assist, including feeding, dressing Comments: history of recent fall  Functional Status:  Mobility: Bed Mobility Overal bed mobility: Needs Assistance Bed Mobility: Supine to Sit, Sit to Supine Supine to sit: Supervision, HOB elevated Sit to supine: Supervision, HOB elevated General bed mobility comments: Supervision with heavy use of rail and HOB elevated. Transfers Overall transfer level: Needs assistance Equipment used: Rolling walker (2 wheeled) Transfers: Sit to/from Stand Sit to Stand: Min assist General transfer comment: Pt independently recalling hand placement but then requiring min cueing to carryover Ambulation/Gait Ambulation/Gait assistance: Mod assist Gait Distance (Feet): 40 Feet Assistive device: Rolling walker (2 wheeled) Gait Pattern/deviations: Step-through pattern, Decreased  stride length, Wide base of support, Decreased dorsiflexion - right, Decreased dorsiflexion - left General Gait Details: Pt required cues for upper trunk control and increasing stride length.  She required assistance to maintain path of RW and to turn and back.  Mildly insteady gt noted, no knee buckling or LOB but does report pain in B knees. Gait velocity: decreased Gait velocity interpretation: <1.8 ft/sec, indicate of risk for recurrent falls  ADL: ADL Overall ADL's : Needs assistance/impaired Eating/Feeding: Set up, Sitting Grooming: Wash/dry hands, Wash/dry face, Oral care, Set up, Sitting Upper Body Bathing: Minimal assistance, Sitting Lower Body Bathing: Moderate assistance, Sit to/from stand Upper Body Dressing : Minimal assistance, Sitting Lower Body Dressing: Moderate assistance, Sit to/from stand, Maximal assistance Lower Body Dressing Details (indicate cue type and reason): Required assist for threading BLE through underwear Toilet Transfer: Minimal assistance, Ambulation, RW Toilet Transfer Details (indicate cue type and reason): ambulation to [facility] Toileting- Clothing Manipulation and Hygiene: Moderate assistance, Sit to/from stand Toileting - Clothing Manipulation Details (indicate cue type and reason): Mod A for posterior peri hygiene Functional mobility during ADLs: Minimal assistance, Rolling walker General ADL Comments: Pt limited by anxiety (baseline) and balance deficits  Cognition: Cognition Overall Cognitive Status: Impaired/Different from baseline Orientation Level: Oriented X4 Cognition Arousal/Alertness: Awake/alert Behavior During Therapy: WFL for tasks assessed/performed Overall Cognitive Status: Impaired/Different from baseline Area of Impairment: Orientation, Safety/judgement, Awareness Orientation Level: Disoriented to, Time Memory: Decreased short-term memory Following Commands: Follows one step commands inconsistently, Follows one step commands with  increased time Safety/Judgement: Decreased awareness of safety, Decreased awareness of deficits Awareness: Emergent Problem Solving: Slow processing, Requires verbal cues General Comments: More alert and following commands consistently this session, not oriented to year.    There were no vitals taken for this visit.   Physical Exam  Nursing note and vitals reviewed. Constitutional: She is oriented to person, place, and time (off one day with the date). She appears well-developed and well-nourished.  HENT:  Head: Normocephalic and atraumatic.  Eyes: Pupils are equal, round, and reactive to light. Conjunctivae and EOM are normal.  Cardiovascular: Normal rate, regular rhythm and normal heart sounds.  No murmur heard. Respiratory: Effort normal and breath sounds normal. No stridor. No respiratory distress. She has no wheezes.  GI: Soft. Bowel sounds are normal. She exhibits no distension.  Musculoskeletal:     Cervical back: Normal range of motion.     Comments: There is decreased ankle dorsiflexion bilaterally no tenderness over the Achilles tendon.  No pain with ankle inversion or eversion. There is no evidence of joint swelling noted.  No erythema.  No tenderness to palpation over the deltoid ligament, anterior talofibular ligament or Achilles tendon.  No tenderness over the joint line.  Neurological: She is alert and oriented  to person, place, and time. She displays tremor.  Motor strength is 5/5 bilateral deltoid bicep tricep grip 3 - bilateral hip flexors 4 - bilateral knee extensors and 4 - bilateral ankle dorsiflexors. There is a mild fine tremor noted during cerebellar testing she has no past pointing with finger-nose-finger exam bilaterally There is no evidence of nystagmus  Skin: Skin is warm and dry.  No evidence of psoriatic plaques on the lower extremities Psych: very pleasant, cooperative  Results for orders placed or performed during the hospital encounter of 04/21/19  (from the past 48 hour(s))  VITAMIN D 25 Hydroxy (Vit-D Deficiency, Fractures)     Status: None   Collection Time: 04/25/19  4:11 AM  Result Value Ref Range   Vit D, 25-Hydroxy 41.27 30 - 100 ng/mL    Comment: (NOTE) Vitamin D deficiency has been defined by the Institute of Medicine  and an Endocrine Society practice guideline as a level of serum 25-OH  vitamin D less than 20 ng/mL (1,2). The Endocrine Society went on to  further define vitamin D insufficiency as a level between 21 and 29  ng/mL (2). 1. IOM (Institute of Medicine). 2010. Dietary reference intakes for  calcium and D. [place]: The [employer]. 2. Holick MF, Binkley [place], Bischoff-Ferrari HA, et al. Evaluation,  treatment, and prevention of vitamin D deficiency: an Endocrine  Society clinical practice guideline, JCEM. 2011 Jul; 96(7): 1911-30. Performed at [facility], [place] [address], [place], [place] [address]   Basic metabolic panel     Status: Abnormal   Collection Time: 04/25/19  4:11 AM  Result Value Ref Range   Sodium 144 135 - 145 mmol/L   Potassium 3.6 3.5 - 5.1 mmol/L   Chloride 110 98 - 111 mmol/L   CO2 23 22 - 32 mmol/L   Glucose, Bld 97 70 - 99 mg/dL   BUN 9 8 - 23 mg/dL   Creatinine, Ser 1.79 (H) 0.44 - 1.00 mg/dL   Calcium 9.3 8.9 - 10.3 mg/dL   GFR calc non Af Amer 28 (L) >60 mL/min   GFR calc Af Amer 33 (L) >60 mL/min   Anion gap 11 5 - 15    Comment: Performed at [facility] [address] [place], [place] [address]  Magnesium     Status: None   Collection Time: 04/25/19  4:11 AM  Result Value Ref Range   Magnesium 1.9 1.7 - 2.4 mg/dL    Comment: Performed at [facility] [address] [place], [place] [address]  T4, free     Status: None   Collection Time: 04/26/19  7:37 AM  Result Value Ref Range   Free T4 1.01 0.61 - 1.12 ng/dL    Comment: (NOTE) Biotin ingestion may interfere with free T4 tests. If the results are inconsistent with the TSH level,  previous test results, or the clinical presentation, then consider biotin interference. If needed, order repeat testing after stopping biotin. Performed at [facility], [place] [address] [place], [place] [address]   Basic metabolic panel     Status: Abnormal   Collection Time: 04/26/19  7:37 AM  Result Value Ref Range   Sodium 144 135 - 145 mmol/L   Potassium 3.5 3.5 - 5.1 mmol/L   Chloride 109 98 - 111 mmol/L   CO2 23 22 - 32 mmol/L   Glucose, Bld 115 (H) 70 - 99 mg/dL   BUN 9 8 - 23  mg/dL   Creatinine, Ser 1.77 (H) 0.44 - 1.00 mg/dL   Calcium 9.1 8.9 - 10.3 mg/dL   GFR calc non Af Amer 29 (L) >60 mL/min   GFR calc Af Amer 33 (L) >60 mL/min   Anion gap 12 5 - 15    Comment: Performed at [facility] [address], [place], [place] [address]   No results found.   Medical Problem List and Plan: 1.  Impaired mobility and ADLs secondary to Debility and encephalopathy in a patient with Parkinson's disease with recent Covid infection.  -patient may shower  -ELOS/Goals: ModI in PT and OT, I in SLP 2.  Antithrombotics: -DVT/anticoagulation:  Pharmaceutical: Heparin  -antiplatelet therapy: ASA 3. Pain Management: Lidocaine patch for LBP/ tylenol prn.   -well controlled 4. Mood: Team to provide ego support. LCSW to follow for evaluation and support.  -would benefit from neuropsych follow-up. See#9. Also has anxiety and depression.   -antipsychotic agents: Lamictal 5. Neuropsych: This patient is not capable of making decisions on her own behalf. 6. Skin/Wound Care: Routine pressure relief measures.  7. Fluids/Electrolytes/Nutrition: Will discontinue IVF. Monitor I/O and offer fluids during the day.  8. New onset seizures: On Keppra bid 9. Bipolar disorder: Last admission- Stable currently on Lamictal 100 mg/hs with Klonopin at night and prn lorazepam.  10. Parkinsonism/Tardrive dyskinesias: Tremors reported to be improving.  11. Hypothyroidism: Now on low dose synthyroid.  Repeat TSH in 4 weeks. 12. Acute on chronic renal failure: On NaHCo3 bid with calcitriol for secondary hyperparathyroidism. Hypernatremia has resolved with SCr --1.77 at baseline.  13. Leucocytosis/Thrombocytopenia: Have resolved. Recheck labs in am.   14. HTN: Amlodipine 2.29m daily. Flowsheet personally reviewed. BP remains uncontrolled. Can increase Amlodipine to 518mdaily. 15. Chronic diarrhea: Patient has had for 30 years. Currently on Imodium.   PaBary LerichePA-C 04/26/2019   I have personally performed a face to face diagnostic evaluation, including, but not limited to relevant history and physical exam findings, of this patient and developed relevant assessment and plan.  Additionally, I have reviewed and concur with the physician assistant's documentation above.  The patient's status has not changed. The original post admission physician evaluation remains appropriate, and any changes from the pre-admission screening or documentation from the acute chart are noted above.   KrLeeroy ChaMD

## 2019-04-26 NOTE — Discharge Summary (Addendum)
Physician Discharge Summary  Adamaris King IOE:[id number] DOB: 1950/07/11 DOA: 04/21/2019  PCP: Leamon Arnt, MD  Admit date: 04/21/2019 Discharge date: 04/26/2019  Admitted From: Home  Disposition:  CIR  Recommendations for Outpatient Follow-up:  1. Follow up with PCP in 1-2 weeks 2. Please obtain BMP/CBC in one week 3. Please follow up on the following pending results: GI pathogen.  4. Continue BH therapy, support for anxiety.  5. Needs close follow up with Primary psychiatrist.  6. Needs follow up with Dr Tat, neurologist.  7. Needs repeat TSH in 6 weeks, after adjustment of synthroid doses.     Discharge Condition: Stable.  CODE STATUS: Full code Diet recommendation: Heart Healthy   Brief/Interim Summary: 69 year old with past medical history significant for hypothyroidism, bipolar, psoriatic arthritis,  breast cancer s/p bilateral mastectomy 2016,  COVID-19 positive on 1/23.  She presents after having a fall the morning of admission, patient was not able to get up and family notes worsening confusion.  At baseline patient is able to ambulate without need of assistance and utilize a walker when traveling outside for long distances.  She is able to recognize family members, but get confused very frequently.  Her sertraline dose has been decreased over a week ago.  Patient was admitted with worsening confusion, gait disturbance and worsening chronic tremors, evaluation in the ED show white blood cell at 17 creatinine 2.7 lithium level 0.79, chest x-ray mild residual left basilar opacity thought to be atelectasis.   1-Acute metabolic encephalopathy; Multifactorial : related to  medications (Chronic lithium toxicity, abnormal thyroid function, Epylepsi new diagnosis).  Confusion, gait disturbance, worsening chronic tremors. -No significant evidence for infection: chest x ray with atelectasis, Leukocytosis almost resolved. UA negative for infection.  -TSH low and free T 4 elevated.  Synthroid was initially held. Elevated thyroid function could explain worsening tremors. Synthroid dose decreased. Tremors stable.  -Lithium level was normal, per neurology lithium toxicity does not necessarily correlate with lithium level. -MRI negative for stroke. B 12 elevated. Ammonia normal.  -EEG showed generalized 3-6 HZ theta delta slowing and left temporal parietal sharp transient. -Plan to continue with keppra.  -Psych was ok with stopping lithium. Resume Zoloft low dose. Per psych we may use  Perphenazine in substitution of lithium. I discussed with neurology who agree with not starting that medication due to increase risk of tardive dyskinesia.  -Patient tremors has improved. -Continue with Lamictal 100 mg at hs.  -Needs neuropsych evaluation for cognitive decline out patient.   -MS stable , improving.    Bipolar, anxiety depression;  -On Ingrezza, Lamotrigine, sertraline and lithium prior to admission.  -Patient was restarted on lower dose Lamotrigine 100 mg at hs and Zoloft 25 mg daily. Subsequently Zoloft was discontinue due to episode of worsening confusion, anxiety.  -Psych recommended Perphenazine, but due to increase risk of tardive dyskinesia will hold for now.  -Discussed with neurology hold ingrezza.  -Patient with worsening anxiety, feeling sad, tearful, difficulty finding words last night. Discussed with neurology hold Zoloft. Psych recommended to resume low dose ativan PRN for anxiety.  -Per neurology might be able to resume Zoloft in the future once patient's  mental status improved.  -Would recommend psychology follow up at CIR>  -Close follow up with primary Psychiatrist is recommended.  -Discussed events with ANP, BH, she agree with currents changes, if patient is stable, continue to monitor on currents meds. Agree with ativan PRN for anxiety.   History of tardive Dyskinesia;  Was on  Ingrezza.  Hold per neurology.   Hypothyroidism;  TSH low at 0.095. Free  T 4 elevated at 1.4  Free T 3 normal.  Resume lower dose synthroid, 112 mcg on 2-13. Monitor for worsening tremors.  Needs repeat TSH in 4 weeks.  Repeated Free T 4 normal. Continue with current dose.   Epilepsy;  New diagnosis.  Sharp transient on EEG.  Started on Keppra. Tolerating Keppra so far.   AKI; on CKD stage stage III Improved with IV fluids. Review records from care-everywhere prior cr at 1.8 GFR 29 in 2018  Stage III  Cr in our records at 2.2,  Needs referral to nephrology outpatient.   Stable at baseline, cr 1.8   Fall; mechanical fall, suspect related to weakness and tremors.  Needs CIR.   Leukocytosis; resolved.  WBC has decreased to 11. Pro-calcitonin 0.20 afebrile.  UA negative. ESR mildly elevated at 30./  Chest x ray atelectasis.  No indication for antibiotics.  Blood culture no growth to date.   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  Dr Tobias Alexander is recommending patient to follow with movement disorder neurology specialist.   [place]; persistently elevated. Started low dose norvasc.  tolerating medication so far.   B 12 supplement. B 12 level elevated. Will give holiday off B 12 supplement.  Resume B 12 supplements in 2 weeks.  Mild thrombocytopenia; resolved  Oral thrush: treated with nystatin.  Mild hypernatremia; Resolved with D 5 IV fluids.   Discharge Diagnoses:  Principal Problem:   Acute metabolic encephalopathy Active Problems:   Acquired hypothyroidism   Bipolar affective disorder, mixed ([place])   Acute kidney injury superimposed on chronic kidney disease ([place])   Leukocytosis   Fall at home, initial encounter   Anxiety and depression   COVID-19    Discharge Instructions  Discharge Instructions    Ambulatory referral to Neurology   Complete by: As directed    An appointment is requested in approximately: 2-4 weeks   Diet - low sodium heart healthy   Complete by: As directed    Increase activity slowly    Complete by: As directed    MyChart COVID-19 home monitoring program   Complete by: Apr 26, 2019    Is the patient willing to use the [place] for home monitoring?: Yes   Temperature monitoring   Complete by: Apr 26, 2019    After how many days would you like to receive a notification of this patient's flowsheet entries?: 1     Allergies as of 04/26/2019      Reactions   Aripiprazole Other (See Comments)   Parkinsonism     Methotrexate Other (See Comments)   Hair loss, severe stomatitis   Amoxicillin-pot Clavulanate Other (See Comments)   Unknown reaction   Cefdinir Other (See Comments)   Other reaction(s): Diarrhea Yeast infection and fever; negative c diff   Etanercept Other (See Comments)   Headaches   Exemestane Other (See Comments)   Suicidal thoughts with medication   Fluoxetine Other (See Comments)   Parkinsonism   Methylprednisolone Sodium Succ Other (See Comments)   Agitated mania   Epinephrine Palpitations   tachycardia   Nitrofurantoin Nausea And Vomiting, Rash          Medication List    STOP taking these medications   Ingrezza 40 MG Caps Generic drug: Valbenazine Tosylate   lithium carbonate 300 MG CR tablet Commonly known as: LITHOBID   sertraline 25 MG tablet Commonly known as:  ZOLOFT   vitamin B-12 1000 MCG tablet Commonly known as: CYANOCOBALAMIN     TAKE these medications   amLODipine 2.5 MG tablet Commonly known as: NORVASC Take 1 tablet (2.5 mg total) by mouth daily.   aspirin 81 MG EC tablet Take 1 tablet (81 mg total) by mouth daily.   AZELAIC ACID EX Apply 1 application topically daily. 15% gel, apply to face.   calcitRIOL 0.25 MCG capsule Commonly known as: ROCALTROL Take 0.25 mcg by mouth every Monday, Wednesday, and Friday.   Cholecalciferol 50 MCG (2000 UT) Tabs Take 1-2 tablets by mouth See admin instructions. Take 1 tablet by mouth 1st day, then take 2 tablets by mouth 2nd day, then repeat   Cimzia 2 X 200 MG  Kit Generic drug: Certolizumab Pegol Inject 200 mg into the skin every 14 (fourteen) days. Administered by [facility] - for psoriatic arthritis   clonazePAM 0.5 MG tablet Commonly known as: KLONOPIN Take 1 tablet (0.5 mg total) by mouth at bedtime. What changed:   how much to take  how to take this  when to take this  additional instructions   lamoTRIgine 100 MG tablet Commonly known as: LAMICTAL Take 1 tablet (100 mg total) by mouth at bedtime. What changed:   how much to take  how to take this  when to take this  additional instructions   levETIRAcetam 250 MG tablet Commonly known as: KEPPRA Take 1 tablet (250 mg total) by mouth 2 (two) times daily.   levothyroxine 112 MCG tablet Commonly known as: SYNTHROID Take 1 tablet (112 mcg total) by mouth daily at 6 (six) AM. What changed:   medication strength  how much to take  when to take this   Lidocaine 4 % Ptch Apply 1 patch topically See admin instructions. Apply up to 3 times daily as needed for pain.   loperamide 2 MG capsule Commonly known as: IMODIUM Take 1 capsule (2 mg total) by mouth as needed for diarrhea or loose stools.   LORazepam 0.5 MG tablet Commonly known as: ATIVAN Take 1 tablet (0.5 mg total) by mouth daily as needed. What changed: reasons to take this   sodium bicarbonate 650 MG tablet Take 650 mg by mouth 2 (two) times daily.   TYLENOL 500 MG tablet Generic drug: acetaminophen Take 500 mg by mouth daily as needed for headache (pain).      Follow-up Information    Leamon Arnt, MD Follow up in 1 week(s).   Specialty: Family Medicine Contact information: [address] [place] [address] [phone]          Allergies  Allergen Reactions  . Aripiprazole Other (See Comments)    Parkinsonism     . Methotrexate Other (See Comments)    Hair loss, severe stomatitis    . Amoxicillin-Pot Clavulanate Other (See Comments)    Unknown reaction    . Cefdinir Other (See Comments)    Other reaction(s): Diarrhea Yeast infection and fever; negative c diff  . Etanercept Other (See Comments)    Headaches    . Exemestane Other (See Comments)    Suicidal thoughts with medication    . Fluoxetine Other (See Comments)    Parkinsonism  . Methylprednisolone Sodium Succ Other (See Comments)    Agitated mania  . Epinephrine Palpitations    tachycardia   . Nitrofurantoin Nausea And Vomiting and Rash         Consultations:  Neurology  Psychiatrist    Procedures/Studies: CT  Head Wo Contrast  Result Date: 04/12/2019 CLINICAL DATA:  Altered mental status and COVID-19 positivity EXAM: CT HEAD WITHOUT CONTRAST TECHNIQUE: Contiguous axial images were obtained from the base of the skull through the vertex without intravenous contrast. COMPARISON:  None. FINDINGS: Brain: Mild prominence of the ventricles is noted which may be related to mild atrophy. No findings to suggest acute hemorrhage, acute infarction or space-occupying mass lesion are noted. Vascular: No hyperdense vessel or unexpected calcification. Skull: Normal. Negative for fracture or focal lesion. Sinuses/Orbits: No acute finding. Other: None. IMPRESSION: Mild atrophy with ventricular prominence. No other focal abnormality is noted. Electronically Signed   By: Inez Catalina M.D.   On: 04/12/2019 19:51   MR BRAIN WO CONTRAST  Result Date: 04/21/2019 CLINICAL DATA:  Encephalopathy. Additional history provided: Fall and generalized weakness, altered mental status, history of Parkinson's disease EXAM: MRI HEAD WITHOUT CONTRAST TECHNIQUE: Multiplanar, multiecho pulse sequences of the brain and surrounding structures were obtained without intravenous contrast. COMPARISON:  Head CT 04/12/2019 FINDINGS: Brain: Motion degraded examination. Most notably there is mild-to-moderate motion degradation of the axial T2 weighted sequence, moderate/severe motion degradation of the sagittal T1 weighted  sequence and moderate/severe motion degradation of the coronal T2 weighted sequence. There is no evidence of acute infarct. No evidence of intracranial mass. No midline shift or extra-axial fluid collection. No chronic intracranial blood products. 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 Mild-to-moderate scattered and confluent T2/FLAIR hyperintensity within the cerebral white matter is nonspecific, but consistent with chronic small vessel ischemic disease. Mild-to-moderate generalized parenchymal atrophy. Mineralization within the bilateral basal ganglia, midbrain and dentate nuclei. Vascular: Flow voids maintained within the proximal large arterial vessels. Skull and upper cervical spine: Within the limitations of motion degradation, no focal marrow lesion is identified. Incompletely assessed upper cervical spondylosis. Sinuses/Orbits: Visualized orbits demonstrate no acute abnormality. Minimal ethmoid and maxillary sinus mucosal thickening. No significant mastoid effusion. IMPRESSION: 1. Motion degraded examination as described. 2. No evidence of acute intracranial abnormality. 3. 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 4. Mild-to-moderate generalized parenchymal atrophy and chronic small vessel ischemic disease. Electronically Signed   By: Kellie Simmering DO   On: 04/21/2019 12:22   DG Chest Portable 1 View  Result Date: 04/21/2019 CLINICAL DATA:  Weakness, recent COVID EXAM: PORTABLE CHEST 1 VIEW COMPARISON:  04/06/2019 FINDINGS: Lungs are essentially clear. Mild residual left basilar opacity, possibly atelectasis. No pleural effusion or pneumothorax. The heart is normal in size. IMPRESSION: Mild residual left basilar opacity, possibly atelectasis. Electronically Signed   By: Julian Hy M.D.   On: 04/21/2019 10:14   DG Chest Port 1 View  Result Date: 04/06/2019 CLINICAL DATA:  Shortness of breath. COVID  positive. EXAM: PORTABLE CHEST 1 VIEW COMPARISON:  04/03/2019 FINDINGS: The cardiac silhouette, mediastinal and hilar contours are within normal limits and stable. Mild tortuosity of the thoracic aorta. No infiltrates or effusions. No pulmonary lesions. The bony thorax is intact. IMPRESSION: No acute cardiopulmonary findings. Electronically Signed   By: Marijo Sanes M.D.   On: 04/06/2019 13:07   DG Chest Portable 1 View  Result Date: 04/03/2019 CLINICAL DATA:  Altered mental status.  Fever. EXAM: PORTABLE CHEST 1 VIEW COMPARISON:  None. FINDINGS: The lungs are clear. Heart size is normal. No pneumothorax or pleural fluid. No acute or focal bony abnormality. IMPRESSION: No acute disease. Electronically Signed   By: Inge Rise M.D.   On: 04/03/2019 15:55  Overnight EEG with video  Result Date: 04/22/2019 Lora Havens, MD     04/23/2019  9:39 AM Patient Name: Mayrani Khamis MRN: [id number] Epilepsy Attending: Lora Havens Referring Physician/Provider: Dr Roland Rack Duration: 04/21/2019 1905 to 04/22/2019 1905  Patient history: 69yo F with drug induced parkinsonism, long history of lithium use and prior COVID infection presented with waxing and waning mental status. EEG to evaluate for seizure  Level of alertness: awake, asleep  AEDs during EEG study: Clonazepam  Technical aspects: This EEG study was done with scalp electrodes positioned according to the 10-20 International system of electrode placement. Electrical activity was acquired at a sampling rate of 500Hz  and reviewed with a high frequency filter of 70Hz  and a low frequency filter of 1Hz . EEG data were recorded continuously and digitally stored.  DESCRIPTION: During awake state, no clear posterior dominant rhythm was seen. Sleep was characterized by vertex waves and generalized 3-5hz  theta-delta slowing. EEG also showed continuous generalized 3-6Hz  theta-delta slowing, at times with triphasic morphology.  Sharp waves were seen in  left temporoparietal region, maximal P7/P3.  These sharp waves are at time periodic, at 0.5-1 Hz and rhythmic without clear evolution, lasting 10 to 15 seconds and without any clinical change.  Hyperventilation and photic stimulation were not performed.  ABNORMALITY -Sharp waves, left temporoparietal region -Continuous slow, generalized  IMPRESSION: This study is suggestive of epileptogenicity arising from left temporoparietal region.  The sharp waves at times appeared rhythmic and periodic without definite clinical changes.  Additionally there is evidence of mild moderate diffuse encephalopathy, non specific to etiology. No clear seizures were seen throughout the recording. Priyanka Barbra Sarks      Subjective: She is alert, she at times feels anxious on the edge. I advised her to ask for ativan as needed.   Discharge Exam: Vitals:   04/25/19 2358 04/26/19 0432  BP: (!) 144/75 (!) 152/69  Pulse: 61 (!) 55  Resp: 18 17  Temp: 97.6 F (36.4 C) 98.1 F (36.7 C)  SpO2: 96% 98%     General: Pt is alert, awake, not in acute distress Cardiovascular: RRR, S1/S2 +, no rubs, no gallops Respiratory: CTA bilaterally, no wheezing, no rhonchi Abdominal: Soft, NT, ND, bowel sounds + Extremities: no edema, no cyanosis    The results of significant diagnostics from this hospitalization (including imaging, microbiology, ancillary and laboratory) are listed below for reference.     Microbiology: Recent Results (from the past 240 hour(s))  Culture, blood (routine x 2)     Status: None   Collection Time: 04/21/19 10:59 AM   Specimen: BLOOD  Result Value Ref Range Status   Specimen Description BLOOD RIGHT ANTECUBITAL  Final   Special Requests   Final    BOTTLES DRAWN AEROBIC AND ANAEROBIC Blood Culture adequate volume   Culture   Final    NO GROWTH 5 DAYS Performed at [facility], [address] [place], [place] [address]    Report Status 04/26/2019 FINAL  Final  Culture, blood  (routine x 2)     Status: None   Collection Time: 04/21/19  2:35 PM   Specimen: BLOOD RIGHT HAND  Result Value Ref Range Status   Specimen Description BLOOD RIGHT HAND  Final   Special Requests   Final    BOTTLES DRAWN AEROBIC AND ANAEROBIC Blood Culture results may not be optimal due to an inadequate volume of blood received in culture bottles   Culture   Final    NO  GROWTH 5 DAYS Performed at [facility], [place] [address], [place], [place] [address]    Report Status 04/26/2019 FINAL  Final     Labs: BNP (last 3 results) No results for input(s): BNP in the last 8760 hours. Basic Metabolic Panel: Recent Labs  Lab 04/22/19 0258 04/23/19 0308 04/24/19 0248 04/25/19 0411 04/26/19 0737  NA 142 146* 141 144 144  K 3.2* 4.4 3.1* 3.6 3.5  CL 111 115* 109 110 109  CO2 20* 20* 23 23 23   GLUCOSE 103* 92 89 97 115*  BUN 16 15 11 9 9   CREATININE 2.18* 1.86* 1.82* 1.79* 1.77*  CALCIUM 9.3 9.2 9.3 9.3 9.1  MG  --   --   --  1.9  --    Liver Function Tests: Recent Labs  Lab 04/21/19 0728  AST 27  ALT 21  ALKPHOS 103  BILITOT 1.2  PROT 7.2  ALBUMIN 3.9   No results for input(s): LIPASE, AMYLASE in the last 168 hours. Recent Labs  Lab 04/21/19 1435  AMMONIA 25   CBC: Recent Labs  Lab 04/21/19 0728 04/22/19 0258 04/23/19 0308 04/24/19 0248  WBC 17.8* 11.4* 11.7* 8.5  NEUTROABS 15.0*  --   --   --   HGB 13.7 12.9 12.6 12.4  HCT 44.5 41.1 40.3 39.4  MCV 95.9 95.4 96.0 95.9  PLT 215 181 144* 191   Cardiac Enzymes: No results for input(s): CKTOTAL, CKMB, CKMBINDEX, TROPONINI in the last 168 hours. BNP: Invalid input(s): POCBNP CBG: No results for input(s): GLUCAP in the last 168 hours. D-Dimer No results for input(s): DDIMER in the last 72 hours. Hgb A1c No results for input(s): HGBA1C in the last 72 hours. Lipid Profile No results for input(s): CHOL, HDL, LDLCALC, TRIG, CHOLHDL, LDLDIRECT in the last 72 hours. Thyroid function studies No results for  input(s): TSH, T4TOTAL, T3FREE, THYROIDAB in the last 72 hours.  Invalid input(s): FREET3 Anemia work up No results for input(s): VITAMINB12, FOLATE, FERRITIN, TIBC, IRON, RETICCTPCT in the last 72 hours. Urinalysis    Component Value Date/Time   COLORURINE YELLOW 04/21/2019 0916   APPEARANCEUR HAZY (A) 04/21/2019 0916   LABSPEC 1.013 04/21/2019 0916   PHURINE 6.0 04/21/2019 0916   GLUCOSEU NEGATIVE 04/21/2019 0916   HGBUR LARGE (A) 04/21/2019 0916   BILIRUBINUR NEGATIVE 04/21/2019 0916   KETONESUR NEGATIVE 04/21/2019 0916   PROTEINUR 100 (A) 04/21/2019 0916   NITRITE NEGATIVE 04/21/2019 0916   LEUKOCYTESUR NEGATIVE 04/21/2019 0916   Sepsis Labs Invalid input(s): PROCALCITONIN,  WBC,  LACTICIDVEN Microbiology Recent Results (from the past 240 hour(s))  Culture, blood (routine x 2)     Status: None   Collection Time: 04/21/19 10:59 AM   Specimen: BLOOD  Result Value Ref Range Status   Specimen Description BLOOD RIGHT ANTECUBITAL  Final   Special Requests   Final    BOTTLES DRAWN AEROBIC AND ANAEROBIC Blood Culture adequate volume   Culture   Final    NO GROWTH 5 DAYS Performed at [facility], [place] [address] [place], [place] [address]    Report Status 04/26/2019 FINAL  Final  Culture, blood (routine x 2)     Status: None   Collection Time: 04/21/19  2:35 PM   Specimen: BLOOD RIGHT HAND  Result Value Ref Range Status   Specimen Description BLOOD RIGHT HAND  Final   Special Requests   Final    BOTTLES DRAWN AEROBIC AND ANAEROBIC Blood Culture results may not be  optimal due to an inadequate volume of blood received in culture bottles   Culture   Final    NO GROWTH 5 DAYS Performed at [facility], [place] [address] [place], [place] [address]    Report Status 04/26/2019 FINAL  Final     Time coordinating discharge: 40 minutes  SIGNED:   Elmarie Shiley, MD  [facility]

## 2019-04-26 NOTE — Progress Notes (Signed)
Inpatient Rehabilitation-Admissions Coordinator   Met with pt and her husband as follow up from PM&R Consult. Educated pt on CIR program, expectations, and anticipated LOS. Pt wants to pursue CIR program at this time. Feel pt is a great candidate for IP Rehab program. Received medical clearance from Dr. Tyrell Antonio for admit to CIR today.   AC will notify RN and TOC team. Please call if questions.   Raechel Ache, OTR/L  Rehab Admissions Coordinator  [phone] 04/26/2019 11:18 AM

## 2019-04-26 NOTE — Telephone Encounter (Signed)
Called.

## 2019-04-26 NOTE — Telephone Encounter (Signed)
See below

## 2019-04-26 NOTE — H&P (Addendum)
Physical Medicine and Rehabilitation Admission H&P    Chief Complaint  Patient presents with  . 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  Family reported waxing and waning of confusion that's usually worse at nights and patient with fall with concerns of stroke therefore admitted for work up.  Patient moved from [place] 08/2018 (Son with [facility]) and has history of intermittent lithium toxicity.  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 Dr. Leonel Ramsay consulted and expressed concern of seizures as well as lithium toxicity. Encephalopathy seen in setting of Covid and would likely take weeks/months to recover. Lithium level-0.87 and Lamictal- 15.3. TSH-0.095/T3-2.4/T4-1.43. Synthroid and Lamictal held as question contributing to tremors/encephalopathy. Noted to have leucocytosis at admission with WBC-17.8--> BC/UA negative and acute on chronic renal failure with SCr-2.67.   LT EEG showed left temporoparietal sharp waves in addition to mild to moderate diffuse encephalopathy. Low dose Keppra added in addition to resuming lower dose Lamictal. Acute on chronic renal failure with hypernatremia has resolved with D5. As T4 levels down to 1.4-->synthroid resumed at lower dose on 2/13. Psychiatry consulted for input on medications changes to help manage anxiety and depression. Lamictal decreased to 100 mg/HS and addition of low dose Sertraline. Neurology however recommended discontinuation of latter as mentation improving and to minimize medications. To use lorazepam prn for anxiety/panic attacks. .  Tremors have improved and patient to follow up  with Dr. Carles Collet after discharge. Therapy evaluations completed revealing balance deficits with significant weakness, cognitive deficits with delayed processing and poor awareness of deficits. CIR recommended due to recent functional decline.     ROS Patient denies pain. Says dyskinesias are much improved, and she is now able to eat independently. Having multiple BM per day and was started on Imodium. Has been sleeping well at night except for last night when she was conscious of not dislodging her right IV and so had difficulty sleeping.    Past Medical History:  Diagnosis Date  . Bipolar 1 disorder ([place])   . Breast cancer ([place])   . Chronic kidney disease (CKD), stage III (moderate)   . Depression 1987  . Parkinson's disease ([place]) 2012  . Psoriatic arthritis ([place])   . PTSD (post-traumatic stress disorder)   . Secondary hyperparathyroidism ([place])   . Tardive dyskinesia     Past Surgical History:  Procedure Laterality Date  . ABDOMINAL HYSTERECTOMY  1987  . CHOLECYSTECTOMY  1979  . DILATION AND CURETTAGE OF UTERUS  1973  . MASTECTOMY Bilateral 09/19/2014  . TONSILLECTOMY  1970  . URETERAL REIMPLANTION Bilateral 1974    Family History  Problem Relation Age of Onset  . Cancer Mother   . Cancer Sister   . Stroke Maternal Grandfather   . Diabetes Paternal Grandfather   . Cancer Sister     Social History:  Married. Was independent prior to Covid. Per  reports that she has never smoked. She has never used smokeless tobacco. She reports that she does not drink alcohol or use drugs.    Allergies  Allergen Reactions  . Aripiprazole Other (See Comments)    Parkinsonism     . Methotrexate Other (See Comments)  Hair loss, severe stomatitis    . Amoxicillin-Pot Clavulanate Other (See Comments)    Unknown reaction   . Cefdinir Other (See Comments)    Other reaction(s): Diarrhea Yeast infection and fever; negative c diff  . Etanercept Other (See Comments)    Headaches    .  Exemestane Other (See Comments)    Suicidal thoughts with medication    . Fluoxetine Other (See Comments)    Parkinsonism  . Methylprednisolone Sodium Succ Other (See Comments)    Agitated mania  . Epinephrine Palpitations    tachycardia   . Nitrofurantoin Nausea And Vomiting and Rash         Medications Prior to Admission  Medication Sig Dispense Refill  . acetaminophen (TYLENOL) 500 MG tablet Take 500 mg by mouth daily as needed for headache (pain).     . AZELAIC ACID EX Apply 1 application topically daily. 15% gel, apply to face.    . calcitRIOL (ROCALTROL) 0.25 MCG capsule Take 0.25 mcg by mouth every Monday, Wednesday, and Friday.     . Certolizumab Pegol (CIMZIA) 2 X 200 MG KIT Inject 200 mg into the skin every 14 (fourteen) days. Administered by [facility] - for psoriatic arthritis     . Cholecalciferol 50 MCG (2000 UT) TABS Take 1-2 tablets by mouth See admin instructions. Take 1 tablet by mouth 1st day, then take 2 tablets by mouth 2nd day, then repeat    . clonazePAM (KLONOPIN) 0.5 MG tablet Take 1 to 2 tablets at bedtime. (Patient taking differently: Take 0.5-1 mg by mouth at bedtime. ) 60 tablet 2  . INGREZZA 40 MG CAPS Take 1 capsule by mouth at bedtime. (Patient taking differently: Take 40 mg by mouth at bedtime. ) 30 capsule 2  . lamoTRIgine (LAMICTAL) 100 MG tablet Take three tablets daily. (Patient taking differently: Take 300 mg by mouth at bedtime. Take three tablets daily.) 270 tablet 1  . levothyroxine (SYNTHROID) 125 MCG tablet Take 125 mcg by mouth daily before breakfast.     . Lidocaine 4 % PTCH Apply 1 patch topically See admin instructions. Apply up to 3 times daily as needed for pain.    Marland Kitchen lithium carbonate (LITHOBID) 300 MG CR tablet Take 300 mg by mouth at bedtime.     Marland Kitchen LORazepam (ATIVAN) 0.5 MG tablet Take 1 tablet (0.5 mg total) by mouth daily as needed. (Patient taking differently: Take 0.5 mg by mouth daily as needed for anxiety. ) 30 tablet  2  . sertraline (ZOLOFT) 25 MG tablet Take 2 tablets (50 mg total) by mouth daily. (Patient taking differently: Take 37.5 mg by mouth daily with breakfast. ) 180 tablet 1  . sodium bicarbonate 650 MG tablet Take 650 mg by mouth 2 (two) times daily.    . vitamin B-12 (CYANOCOBALAMIN) 1000 MCG tablet Take 1 tablet by mouth daily with breakfast.       Drug Regimen Review  Drug regimen was reviewed and remains appropriate with no significant issues identified  Home: Home Living Family/patient expects to be discharged to:: Private residence Living Arrangements: Spouse/significant other, Children(daughter) Available Help at Discharge: Family, Available 24 hours/day Type of Home: House Home Access: Stairs to enter [employer] of Steps: 3 Home Layout: One level Bathroom Shower/Tub: Multimedia programmer: Standard Home Equipment: Environmental consultant - 4 wheels, Shower seat, Grab bars - tub/shower   Functional History: Prior Function Level of Independence: Needs assistance Gait / Transfers Assistance Needed: uses Rollator for community ambulation, furniture walks  indoors ADL's / Homemaking Assistance Needed: In the last couple of weeks, pt requiring increased assist, including feeding, dressing Comments: history of recent fall  Functional Status:  Mobility: Bed Mobility Overal bed mobility: Needs Assistance Bed Mobility: Supine to Sit, Sit to Supine Supine to sit: Supervision, HOB elevated Sit to supine: Supervision, HOB elevated General bed mobility comments: Supervision with heavy use of rail and HOB elevated. Transfers Overall transfer level: Needs assistance Equipment used: Rolling walker (2 wheeled) Transfers: Sit to/from Stand Sit to Stand: Min assist General transfer comment: Pt independently recalling hand placement but then requiring min cueing to carryover Ambulation/Gait Ambulation/Gait assistance: Mod assist Gait Distance (Feet): 40 Feet Assistive device:  Rolling walker (2 wheeled) Gait Pattern/deviations: Step-through pattern, Decreased stride length, Wide base of support, Decreased dorsiflexion - right, Decreased dorsiflexion - left General Gait Details: Pt required cues for upper trunk control and increasing stride length.  She required assistance to maintain path of RW and to turn and back.  Mildly insteady gt noted, no knee buckling or LOB but does report pain in B knees. Gait velocity: decreased Gait velocity interpretation: <1.8 ft/sec, indicate of risk for recurrent falls    ADL: ADL Overall ADL's : Needs assistance/impaired Eating/Feeding: Set up, Sitting Grooming: Wash/dry hands, Wash/dry face, Oral care, Set up, Sitting Upper Body Bathing: Minimal assistance, Sitting Lower Body Bathing: Moderate assistance, Sit to/from stand Upper Body Dressing : Minimal assistance, Sitting Lower Body Dressing: Moderate assistance, Sit to/from stand, Maximal assistance Lower Body Dressing Details (indicate cue type and reason): Required assist for threading BLE through underwear Toilet Transfer: Minimal assistance, Ambulation, RW Toilet Transfer Details (indicate cue type and reason): ambulation to [facility] Toileting- Clothing Manipulation and Hygiene: Moderate assistance, Sit to/from stand Toileting - Clothing Manipulation Details (indicate cue type and reason): Mod A for posterior peri hygiene Functional mobility during ADLs: Minimal assistance, Rolling walker General ADL Comments: Pt limited by anxiety (baseline) and balance deficits  Cognition: Cognition Overall Cognitive Status: Impaired/Different from baseline Orientation Level: Oriented X4 Cognition Arousal/Alertness: Awake/alert Behavior During Therapy: WFL for tasks assessed/performed Overall Cognitive Status: Impaired/Different from baseline Area of Impairment: Orientation, Safety/judgement, Awareness Orientation Level: Disoriented to, Time Memory: Decreased short-term  memory Following Commands: Follows one step commands inconsistently, Follows one step commands with increased time Safety/Judgement: Decreased awareness of safety, Decreased awareness of deficits Awareness: Emergent Problem Solving: Slow processing, Requires verbal cues General Comments: More alert and following commands consistently this session, not oriented to year.    Blood pressure (!) 168/80, pulse (!) 57, temperature 97.6 F (36.4 C), temperature source Oral, resp. rate 18, height '5\' 4"'$  (1.626 m), weight 90.3 kg, SpO2 99 %. Physical Exam  Nursing note and vitals reviewed. Constitutional: She is oriented to person, place, and time (off one day with the date). She appears well-developed and well-nourished.  HENT:  Head: Normocephalic and atraumatic.  Eyes: Pupils are equal, round, and reactive to light. Conjunctivae and EOM are normal.  Cardiovascular: Normal rate, regular rhythm and normal heart sounds.  No murmur heard. Respiratory: Effort normal and breath sounds normal. No stridor. No respiratory distress. She has no wheezes.  GI: Soft. Bowel sounds are normal. She exhibits no distension.  Musculoskeletal:     Cervical back: Normal range of motion.     Comments: There is decreased ankle dorsiflexion bilaterally no tenderness over the Achilles tendon.  No pain with ankle inversion or eversion. There is no evidence of joint swelling noted.  No erythema.  No tenderness to palpation  over the deltoid ligament, anterior talofibular ligament or Achilles tendon.  No tenderness over the joint line.  Neurological: She is alert and oriented to person, place, and time. She displays tremor.  Motor strength is 5/5 bilateral deltoid bicep tricep grip 3 - bilateral hip flexors 4 - bilateral knee extensors and 4 - bilateral ankle dorsiflexors. There is a mild fine tremor noted during cerebellar testing she has no past pointing with finger-nose-finger exam bilaterally There is no evidence of  nystagmus  Skin: Skin is warm and dry.  No evidence of psoriatic plaques on the lower extremities Psych: very pleasant, cooperative  Results for orders placed or performed during the hospital encounter of 04/21/19 (from the past 48 hour(s))  VITAMIN D 25 Hydroxy (Vit-D Deficiency, Fractures)     Status: None   Collection Time: 04/25/19  4:11 AM  Result Value Ref Range   Vit D, 25-Hydroxy 41.27 30 - 100 ng/mL    Comment: (NOTE) Vitamin D deficiency has been defined by the Institute of Medicine  and an Endocrine Society practice guideline as a level of serum 25-OH  vitamin D less than 20 ng/mL (1,2). The Endocrine Society went on to  further define vitamin D insufficiency as a level between 21 and 29  ng/mL (2). 1. IOM (Institute of Medicine). 2010. Dietary reference intakes for  calcium and D. [place]: The [employer]. 2. Holick MF, Binkley [place], Bischoff-Ferrari HA, et al. Evaluation,  treatment, and prevention of vitamin D deficiency: an Endocrine  Society clinical practice guideline, JCEM. 2011 Jul; 96(7): 1911-30. Performed at [facility], [place] [address], [place], [place] [address]   Basic metabolic panel     Status: Abnormal   Collection Time: 04/25/19  4:11 AM  Result Value Ref Range   Sodium 144 135 - 145 mmol/L   Potassium 3.6 3.5 - 5.1 mmol/L   Chloride 110 98 - 111 mmol/L   CO2 23 22 - 32 mmol/L   Glucose, Bld 97 70 - 99 mg/dL   BUN 9 8 - 23 mg/dL   Creatinine, Ser 1.79 (H) 0.44 - 1.00 mg/dL   Calcium 9.3 8.9 - 10.3 mg/dL   GFR calc non Af Amer 28 (L) >60 mL/min   GFR calc Af Amer 33 (L) >60 mL/min   Anion gap 11 5 - 15    Comment: Performed at [facility] [address] [place], [place] [address]  Magnesium     Status: None   Collection Time: 04/25/19  4:11 AM  Result Value Ref Range   Magnesium 1.9 1.7 - 2.4 mg/dL    Comment: Performed at [facility] [address], [place], [place] [address]  T4, free     Status: None    Collection Time: 04/26/19  7:37 AM  Result Value Ref Range   Free T4 1.01 0.61 - 1.12 ng/dL    Comment: (NOTE) Biotin ingestion may interfere with free T4 tests. If the results are inconsistent with the TSH level, previous test results, or the clinical presentation, then consider biotin interference. If needed, order repeat testing after stopping biotin. Performed at [facility], [place] [address] [place], [place] [address]   Basic metabolic panel     Status: Abnormal   Collection Time: 04/26/19  7:37 AM  Result Value Ref Range   Sodium 144 135 - 145 mmol/L   Potassium 3.5 3.5 - 5.1 mmol/L   Chloride 109 98 - 111 mmol/L  CO2 23 22 - 32 mmol/L   Glucose, Bld 115 (H) 70 - 99 mg/dL   BUN 9 8 - 23 mg/dL   Creatinine, Ser 1.77 (H) 0.44 - 1.00 mg/dL   Calcium 9.1 8.9 - 10.3 mg/dL   GFR calc non Af Amer 29 (L) >60 mL/min   GFR calc Af Amer 33 (L) >60 mL/min   Anion gap 12 5 - 15    Comment: Performed at [facility] [address] [place], [place] [address]   No results found.     Medical Problem List and Plan: 1.  Impaired mobility and ADLs secondary to Debility and encephalopathy in a patient with Parkinson's disease with recent Covid infection.  -patient may shower  -ELOS/Goals: ModI in PT and OT, I in SLP 2.  Antithrombotics: -DVT/anticoagulation:  Pharmaceutical: Heparin  -antiplatelet therapy: ASA 3. Pain Management: Lidocaine patch for LBP/ tylenol prn.   -well controlled 4. Mood: Team to provide ego support. LCSW to follow for evaluation and support.  -would benefit from neuropsych follow-up. See#9. Also has anxiety and depression.   -antipsychotic agents: Lamictal 5. Neuropsych: This patient is not capable of making decisions on her own behalf. 6. Skin/Wound Care: Routine pressure relief measures.  7. Fluids/Electrolytes/Nutrition: Will discontinue IVF. Monitor I/O and offer fluids during the day.  8. New onset seizures: On Keppra bid 9. Bipolar  disorder: Last admission- Stable currently on Lamictal 100 mg/hs with Klonopin at night and prn lorazepam.  10. Parkinsonism/Tardrive dyskinesias: Tremors reported to be improving.  11. Hypothyroidism: Now on low dose synthyroid. Repeat TSH in 4 weeks. 12. Acute on chronic renal failure: On NaHCo3 bid with calcitriol for secondary hyperparathyroidism. Hypernatremia has resolved with SCr --1.77 at baseline.  13. Leucocytosis/Thrombocytopenia: Have resolved. Recheck labs in am.   14. HTN: Amlodipine 2.23m daily. Flowsheet personally reviewed. BP remains uncontrolled. Can increase Amlodipine to 570mdaily. 15. Chronic diarrhea: Patient has had for 30 years. Currently on Imodium.    PaBary LerichePA-C 04/26/2019   I have personally performed a face to face diagnostic evaluation, including, but not limited to relevant history and physical exam findings, of this patient and developed relevant assessment and plan.  Additionally, I have reviewed and concur with the physician assistant's documentation above.  KrLeeroy ChaMD

## 2019-04-26 NOTE — Evaluation (Signed)
Physical Therapy Assessment and Plan  Patient Details  Name: Tiffany [name] MRN: [id number] Date of Birth: 1950-11-22  PT Diagnosis: Abnormal posture, Abnormality of gait, Difficulty walking and Muscle weakness Rehab Potential: Good ELOS: 7-10 days   Today's Date: 04/27/2019 PT Individual Time: 0805-0900 PT Individual Time Calculation (min): 55 min    Problem List:  Patient Active Problem List   Diagnosis Date Noted  . Encephalopathy 04/26/2019  . Acute encephalopathy 04/21/2019  . Acute kidney injury superimposed on chronic kidney disease ([place]) 04/21/2019  . Leukocytosis 04/21/2019  . Fall at home, initial encounter 04/21/2019  . Acute metabolic encephalopathy 09/32/6712  . Anxiety and depression 04/21/2019  . COVID-19 04/21/2019  . Thrombocytopenia ([place]) 09/04/2018  . Vitamin D deficiency 09/04/2018  . Psoriatic arthritis ([place]) 09/04/2018  . History of colonic polyps 05/28/2017  . 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 Malignant neoplasm of overlapping sites of left breast in female, estrogen receptor positive ([place]) 03/14/2016  . Chronic kidney disease, stage III (moderate) 01/19/2016  . Tardive dyskinesia 10/18/2015  . HX: breast cancer 12/27/2014  . Osteopenia determined by x-ray 10/31/2014  . Rosacea 05/21/2007  . Bipolar affective disorder, mixed ([place]) 08/16/2004  . Acquired hypothyroidism 04/03/1996    Past Medical History:  Past Medical History:  Diagnosis Date  . Bipolar 1 disorder ([place])   . Breast cancer ([place])   . Chronic diarrhea    loose stools twice a day on average for years  . Chronic kidney disease (CKD), stage III (moderate)   . Depression 1987  . Parkinson's disease ([place]) 2012  . Psoriatic arthritis ([place])   . PTSD (post-traumatic stress disorder)   . Secondary hyperparathyroidism ([place])   . Tardive dyskinesia    Past Surgical History:  Past Surgical History:  Procedure Laterality Date  . ABDOMINAL  HYSTERECTOMY  1987  . CHOLECYSTECTOMY  1979  . DILATION AND CURETTAGE OF UTERUS  1973  . MASTECTOMY Bilateral 09/19/2014  . TONSILLECTOMY  1970  . 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  Family reported waxing and waning of confusion that's usually worse at nights and patient with fall with concerns of stroke therefore admitted for work up.  Patient moved from [place] 08/2018 (Son with [employer]) and has history of intermittent lithium toxicity.  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 Dr. Leonel Ramsay consulted and expressed concern of seizures as well as lithium toxicity. Encephalopathy seen in setting of Covid and would likely take weeks/months to recover. Lithium level-0.87 and Lamictal- 15.3. TSH-0.095/T3-2.4/T4-1.43. Synthroid and Lamictal held as question contributing to tremors/encephalopathy. Noted to have leucocytosis at admission with WBC-17.8--> BC/UA negative and acute on chronic renal failure with SCr-2.67.   LT EEG showed left temporoparietal sharp waves in addition to mild to moderate diffuse encephalopathy. Low dose Keppra added in addition to resuming lower dose Lamictal. Acute on chronic renal failure with hypernatremia has resolved with D5. As T4 levels down to 1.4-->synthroid resumed at lower dose on 2/13. Psychiatry consulted for input on medications changes to help manage anxiety and depression. Lamictal decreased to 100 mg/HS and addition of low dose Sertraline.  Neurology however recommended discontinuation of latter as mentation improving and to minimize medications.  To use lorazepam prn for anxiety/panic attacks. .  Tremors have improved and patient to follow up with Dr. Carles Collet after discharge. Therapy evaluations completed revealing balance deficits with significant weakness, cognitive deficits with delayed processing and poor awareness of deficits. CIR recommended due to recent functional decline. Patient transferred to CIR on 04/26/2019 .   Patient currently requires min with mobility secondary to muscle weakness, decreased cardiorespiratoy endurance and decreased standing balance, decreased postural control and decreased balance strategies.  Prior to hospitalization, patient was min with mobility over last few months, otherwise was independent prior to that time, and lived with Spouse, Daughter in a House home.  Home access is 3Stairs to enter.  Patient will benefit from skilled PT intervention to maximize safe functional mobility, minimize fall risk and decrease caregiver burden for planned discharge home with 24 hour supervision.  Anticipate patient will benefit from follow up OP at discharge.  PT - End of Session Activity Tolerance: Tolerates < 10 min activity, no significant change in vital signs Endurance Deficit: Yes Endurance Deficit Description: decreased PT Assessment Rehab Potential (ACUTE/IP ONLY): Good PT Barriers to Discharge: Inaccessible home environment;Medical stability PT Patient demonstrates impairments in the following area(s): Balance;Behavior;Endurance;Motor;Safety PT Transfers Functional Problem(s): Bed Mobility;Bed to Chair;Car;Furniture;Floor PT Locomotion Functional Problem(s): Ambulation;Stairs PT Plan PT Intensity: Minimum of 1-2 x/day ,45 to 90 minutes PT Frequency: 5 out of 7 days PT Duration Estimated Length of Stay: 7-10 days PT Treatment/Interventions: Ambulation/gait training;Cognitive remediation/compensation;Discharge planning;DME/adaptive equipment instruction;Functional mobility training;Pain management;Psychosocial  support;Splinting/orthotics;Therapeutic Activities;UE/LE Strength taining/ROM;UE/LE Coordination activities;Therapeutic Exercise;Stair training;Skin care/wound management;Patient/family education;Disease management/prevention;Functional electrical stimulation;Neuromuscular re-education;Community reintegration;Balance/vestibular training PT Transfers Anticipated Outcome(s): supervision PT Locomotion Anticipated Outcome(s): supervision w/ LRAD PT Recommendation Recommendations for Other Services: Neuropsych consult Follow Up Recommendations: Outpatient PT Patient destination: Home Equipment Recommended: To be determined  Skilled Therapeutic Intervention  Pt in supine and agreeable to therapy, no c/o pain. Performed functional mobility as detailed below. Pt needed frequent rest breaks 2/2 c/o nausea (premedicated). Practiced gait w/ and w/o AD, gait pattern and speed of gait improves w/o AD. Total assist w/c transport to/from therapy gym and around unit. Returned to room and pt reported she needed to toilet. Stand pivot to toilet w/ min assist and pt continent of bowel and bladder. Set-up assist to wash hands and brush teeth from seated level at sink.   Instructed pt in results of PT evaluation as detailed below, PT POC, rehab potential, rehab goals, and discharge recommendations. Additionally discussed CIR's policies regarding fall safety and use of chair alarm and/or quick release belt. Pt verbalized understanding and in agreement. Ended session in w/c, all needs in reach.  PT Evaluation Precautions/Restrictions Precautions Precautions: Fall Restrictions Weight Bearing Restrictions: No General   Vital SignsTherapy Vitals Pulse Rate: 66 BP: 126/69 Oxygen Therapy SpO2: 97 % Pain Pain Assessment Pain Scale: 0-10 Pain Score: 0-No pain Home Living/Prior Functioning Home Living Available Help at Discharge: Family;Available 24 hours/day Type of Home: House Home Access: Stairs to  enter [employer] of Steps: 3 Entrance Stairs-Rails: Can reach both Home Layout: One level  Lives With: Spouse;Daughter Prior Function Level of Independence: Needs assistance with ADLs;Needs assistance with homemaking;Needs assistance with gait(last few months, husband needed to provide physical assist for all mobility, pt did not use AD in house but used rollator in community)  Able to Take Stairs?: Yes(needed physical assist) Driving: Yes Vocation: Retired Comments: Pt w/ multiple falls recently, she  attributes this to "instability and weakness". Family needed EMS help to get pt off floor. Vision/Perception  Vision - Assessment Eye Alignment: Within Functional Limits Ocular Range of Motion: Within Functional Limits Alignment/Gaze Preference: Within Defined Limits Tracking/Visual Pursuits: Able to track stimulus in all quads without difficulty Saccades: Undershoots Perception Perception: Within Functional Limits Praxis Praxis: Intact  Cognition   Sensation Sensation Light Touch: Appears Intact Coordination Gross Motor Movements are Fluid and Coordinated: Yes Motor  Motor Motor - Skilled Clinical Observations: Generalized weakness, pt reports tremors in B hands prior to hospitalization. Reports she was unable to hold utensils to eat. Now pt is without tremors, she is unsure if it's due to medication changes or not. WIll continue to assess. No other motor impairments observed.  Mobility Bed Mobility Bed Mobility: Rolling Right;Rolling Left;Supine to Sit;Sit to Supine Rolling Right: Supervision/verbal cueing Rolling Left: Supervision/Verbal cueing Supine to Sit: Supervision/Verbal cueing Sit to Supine: Supervision/Verbal cueing Transfers Transfers: Sit to Stand;Stand to Sit;Stand Pivot Transfers Sit to Stand: Minimal Assistance - Patient > 75% Stand to Sit: Minimal Assistance - Patient > 75% Stand Pivot Transfers: Minimal Assistance - Patient > 75% Stand Pivot  Transfer Details: Tactile cues for weight shifting;Manual facilitation for placement;Verbal cues for precautions/safety Transfer (Assistive device): None Locomotion  Gait Ambulation: Yes Gait Assistance: Minimal Assistance - Patient > 75% Gait Distance (Feet): 75 Feet Assistive device: None Gait Gait: Yes Gait Pattern: Impaired Gait Pattern: Shuffle;Festinating;Poor foot clearance - right;Poor foot clearance - left;Trunk flexed(mildly parkinsonian) Gait velocity: decreased Stairs / Additional Locomotion Stairs: Yes Stairs Assistance: Minimal Assistance - Patient > 75% Stair Management Technique: Two rails Number of Stairs: 4 Height of Stairs: 6 Wheelchair Mobility Wheelchair Mobility: No  Trunk/Postural Assessment  Cervical Assessment Cervical Assessment: Within Functional Limits Thoracic Assessment Thoracic Assessment: Within Functional Limits Lumbar Assessment Lumbar Assessment: Exceptions to WFL(posterior pelvic tilt) Postural Control Postural Control: Deficits on evaluation(rigidity, delayed/absent)  Balance Balance Balance Assessed: Yes Static Sitting Balance Static Sitting - Level of Assistance: 5: Stand by assistance Dynamic Sitting Balance Dynamic Sitting - Level of Assistance: 5: Stand by assistance Static Standing Balance Static Standing - Level of Assistance: 4: Min assist Dynamic Standing Balance Dynamic Standing - Level of Assistance: 4: Min assist Extremity Assessment  RLE Assessment RLE Assessment: Exceptions to [facility] General Strength Comments: Globally 4/5 LLE Assessment LLE Assessment: Exceptions to [facility] General Strength Comments: Globally 4/5    Refer to Care Plan for Long Term Goals  Recommendations for other services: Neuropsych  Discharge Criteria: Patient will be discharged from PT if patient refuses treatment 3 consecutive times without medical reason, if treatment goals not met, if there is a change in medical status, if patient makes no  progress towards goals or if patient is discharged from hospital.  The above assessment, treatment plan, treatment alternatives and goals were discussed and mutually agreed upon: by patient  Trustin Chapa K Charlii Yost 04/27/2019, 10:18 AM

## 2019-04-26 NOTE — Progress Notes (Signed)
Patient ID: Tiffany [name], female   DOB: 1951/03/02, 69 y.o.   MRN: [id number] Patient arrived from 3W10 with RN, spouse, and patient belongings. Patient and spouse oriented to room, rehab process, schedule, safety plan, fall prevention plan, and health resource notebook. Patient was left resting in bed with spouse at the bedside, nurse call bell at side, and bed alarm on. Patient has not complaints of pain.

## 2019-04-26 NOTE — TOC Transition Note (Signed)
Transition of Care [facility]) - CM/SW Discharge Note   Patient Details  Name: Tiffany [name] MRN: [id number] Date of Birth: 08-Aug-1950  [facility]) CM/SW Contact:  Pollie Friar, RN Phone Number: 04/26/2019, 10:43 AM   Clinical Narrative:    Pt discharging to CIR today. CM signing off.    Final next level of care: IP Rehab Facility Barriers to Discharge: No Barriers Identified   Patient Goals and CMS Choice        Discharge Placement                       Discharge Plan and Services                                     Social Determinants of Health (SDOH) Interventions     Readmission Risk Interventions No flowsheet data found.

## 2019-04-26 NOTE — PMR Pre-admission (Signed)
PMR Admission Coordinator Pre-Admission Assessment  Patient: Tiffany [name] is an 69 y.o., female MRN: [id number] DOB: 09/06/1950 Height: 5' 4" (162.6 cm) Weight: 90.3 kg              Insurance Information HMO:     PPO:      PCP:      IPA:      80/20: yes     OTHER:  PRIMARY: Medicare part A and B      Policy#: [id number]      Subscriber: Patient CM Name:       Phone#:      Fax#:  Pre-Cert#:       Employer:  Benefits:  Phone #: verified online     Name: verified eligibility via [employer] on 04/26/19 Eff. Date: Part A effective 03/12/2015; part B effective 12/10/2015     Deduct: $1,484      Out of Pocket Max: NA      Life Max: NA CIR: Covered per Medicare guidelines once yearly deductible has been met.       SNF: days 1-20, 100%, days 21-100, 80% Outpatient: 80%     Co-Pay: 20% Home Health: 100%      Co-Pay:  DME: 80%     Co-Pay: 20% Providers: Pt's choice SECONDARY: [employer]       Policy#: [id number]      Subscriber: Patient CM Name:       Phone#:      Fax#:  Pre-Cert#:       Employer:  Benefits:  Phone #: [phone]     Name:  Eff. Date:      Deduct:       Out of Pocket Max:       Life Max:  CIR:       SNF:  Outpatient:      Co-Pay:  Home Health:       Co-Pay:  DME:      Co-Pay:   Medicaid Application Date:       Case Manager:  Disability Application Date:       Case Worker:   The "Data Collection Information Summary" for patients in Inpatient Rehabilitation Facilities with attached "Privacy Act Statement-Health Care Records" was provided and verbally reviewed with: Patient and Family  Emergency Contact Information Contact Information    Name Relation Home Work Mobile   Fritchman, Walter Spouse   [phone]     Current Medical History  Patient Admitting Diagnosis: Debility and encephalopathy in a patient with Parkinson's disease with recent Covid infection.  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  Family reported waxing and waning of confusion that's usually worse at nights and patient with fall with concerns of stroke therefore admitted for work up.  Patient moved from [place] 08/2018 (Son with [facility]) and has history of intermittent lithium toxicity.  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 Dr. Kirkpatrick consulted and expressed concern of seizures as well as lithium toxicity. Encephalopathy seen in setting of Covid and would likely take weeks/months to recover. Lithium level-0.87 and Lamictal- 15.3. TSH-0.095/T3-2.4/T4-1.43. Synthroid and Lamictal held as question contributing to tremors/encephalopathy. Noted to have leucocytosis at admission with   WBC-17.8--> BC/UA negative and acute on chronic renal failure with SCr-2.67.   LT EEG showed left temporoparietal sharp waves in addition to mild to moderate diffuse encephalopathy. Low dose Keppra added in addition to resuming lower dose Lamictal. Acute on chronic renal failure with hypernatremia has resolved with D5. As T4 levels down to 1.4-->synthroid resumed at lower dose on 2/13. Psychiatry consulted for input on medications changes to help manage anxiety and depression. Lamictal decreased to 100 mg/HS and addition of low dose Sertraline. Neurology however recommended discontinuation of latter as mentation improving and to minimize medications. To use lorazepam prn for anxiety/panic attacks. .  Tremors have improved and patient to follow up with Dr. Carles Collet after discharge. Therapy evaluations completed revealing balance deficits with significant weakness, cognitive deficits with delayed processing and poor awareness of deficits. CIR recommended due to recent functional  decline. Pt is to admit to CIR on 04/26/19.      Glasgow Coma Scale Score: 15  Past Medical History  Past Medical History:  Diagnosis Date  . Bipolar 1 disorder ([place])   . Breast cancer ([place])   . Chronic diarrhea    loose stools twice a day on average for years  . Chronic kidney disease (CKD), stage III (moderate)   . Depression 1987  . Parkinson's disease ([place]) 2012  . Psoriatic arthritis ([place])   . PTSD (post-traumatic stress disorder)   . Secondary hyperparathyroidism ([place])   . Tardive dyskinesia     Family History  family history includes Cancer in her mother, sister, and sister; Diabetes in her paternal grandfather; Stroke in her maternal grandfather.  Prior Rehab/Hospitalizations:  Has the patient had prior rehab or hospitalizations prior to admission? No  Has the patient had major surgery during 100 days prior to admission? No  Current Medications   Current Facility-Administered Medications:  .  0.45 % sodium chloride infusion, , Intravenous, Continuous, Regalado, Belkys A, MD, Last Rate: 50 mL/hr at 04/25/19 1601, New Bag at 04/25/19 1601 .  acetaminophen (TYLENOL) tablet 650 mg, 650 mg, Oral, Q6H PRN **OR** acetaminophen (TYLENOL) suppository 650 mg, 650 mg, Rectal, Q6H PRN, Smith, Rondell A, MD .  albuterol (PROVENTIL) (2.5 MG/3ML) 0.083% nebulizer solution 2.5 mg, 2.5 mg, Nebulization, Q6H PRN, Smith, Rondell A, MD .  amLODipine (NORVASC) tablet 2.5 mg, 2.5 mg, Oral, Daily, Regalado, Belkys A, MD, 2.5 mg at 04/26/19 1045 .  aspirin EC tablet 81 mg, 81 mg, Oral, Daily, Greta Doom, MD, 81 mg at 04/26/19 1045 .  calcitRIOL (ROCALTROL) capsule 0.25 mcg, 0.25 mcg, Oral, Q M,W,F, Smith, Rondell A, MD, 0.25 mcg at 04/26/19 1046 .  clonazePAM (KLONOPIN) tablet 0.5 mg, 0.5 mg, Oral, QHS, Smith, Rondell A, MD, 0.5 mg at 04/25/19 2248 .  heparin injection 5,000 Units, 5,000 Units, Subcutaneous, Q8H, Smith, Rondell A, MD, 5,000 Units at 04/26/19 [phone] .  hydrALAZINE  (APRESOLINE) tablet 10 mg, 10 mg, Oral, Q8H PRN, Regalado, Belkys A, MD, 10 mg at 04/25/19 2257 .  lamoTRIgine (LAMICTAL) tablet 100 mg, 100 mg, Oral, QHS, Lora Havens, MD, 100 mg at 04/25/19 2247 .  levETIRAcetam (KEPPRA) tablet 250 mg, 250 mg, Oral, BID, Lora Havens, MD, 250 mg at 04/26/19 1045 .  levothyroxine (SYNTHROID) tablet 112 mcg, 112 mcg, Oral, Q0600, Regalado, Belkys A, MD, 112 mcg at 04/26/19 1045 .  lidocaine (LIDODERM) 5 % 1 patch, 1 patch, Transdermal, Daily PRN, Smith, Rondell A, MD .  loperamide (IMODIUM) capsule 2 mg, 2 mg, Oral, PRN, Regalado, Belkys  A, MD, 2 mg at 04/26/19 1048 .  LORazepam (ATIVAN) injection 1 mg, 1 mg, Intravenous, Q6H PRN, Regalado, Belkys A, MD .  LORazepam (ATIVAN) tablet 0.5 mg, 0.5 mg, Oral, Daily PRN, Regalado, Belkys A, MD .  nystatin (MYCOSTATIN) 100000 UNIT/ML suspension 500,000 Units, 5 mL, Oral, QID, Regalado, Belkys A, MD, 500,000 Units at 04/26/19 1000 .  ondansetron (ZOFRAN) tablet 4 mg, 4 mg, Oral, Q6H PRN **OR** ondansetron (ZOFRAN) injection 4 mg, 4 mg, Intravenous, Q6H PRN, Smith, Rondell A, MD .  sodium bicarbonate tablet 650 mg, 650 mg, Oral, BID, Regalado, Belkys A, MD, 650 mg at 04/26/19 1046 .  sodium chloride flush (NS) 0.9 % injection 3 mL, 3 mL, Intravenous, Q12H, Smith, Rondell A, MD, 3 mL at 04/26/19 1050  Current Outpatient Medications:  .  acetaminophen (TYLENOL) 500 MG tablet, Take 500 mg by mouth daily as needed for headache (pain). , Disp: , Rfl:  .  AZELAIC ACID EX, Apply 1 application topically daily. 15% gel, apply to face., Disp: , Rfl:  .  calcitRIOL (ROCALTROL) 0.25 MCG capsule, Take 0.25 mcg by mouth every Monday, Wednesday, and Friday. , Disp: , Rfl:  .  Certolizumab Pegol (CIMZIA) 2 X 200 MG KIT, Inject 200 mg into the skin every 14 (fourteen) days. Administered by [facility] - for psoriatic arthritis , Disp: , Rfl:  .  Cholecalciferol 50 MCG (2000 UT) TABS, Take 1-2 tablets by mouth See admin  instructions. Take 1 tablet by mouth 1st day, then take 2 tablets by mouth 2nd day, then repeat, Disp: , Rfl:  .  Lidocaine 4 % PTCH, Apply 1 patch topically See admin instructions. Apply up to 3 times daily as needed for pain., Disp: , Rfl:  .  LORazepam (ATIVAN) 0.5 MG tablet, Take 1 tablet (0.5 mg total) by mouth daily as needed. (Patient taking differently: Take 0.5 mg by mouth daily as needed for anxiety. ), Disp: 30 tablet, Rfl: 2 .  sodium bicarbonate 650 MG tablet, Take 650 mg by mouth 2 (two) times daily., Disp: , Rfl:  .  amLODipine (NORVASC) 2.5 MG tablet, Take 1 tablet (2.5 mg total) by mouth daily., Disp: 30 tablet, Rfl: 0 .  aspirin EC 81 MG EC tablet, Take 1 tablet (81 mg total) by mouth daily., Disp: 30 tablet, Rfl: 0 .  clonazePAM (KLONOPIN) 0.5 MG tablet, Take 1 tablet (0.5 mg total) by mouth at bedtime., Disp: 30 tablet, Rfl: 0 .  lamoTRIgine (LAMICTAL) 100 MG tablet, Take 1 tablet (100 mg total) by mouth at bedtime., Disp: 30 tablet, Rfl: 0 .  levETIRAcetam (KEPPRA) 250 MG tablet, Take 1 tablet (250 mg total) by mouth 2 (two) times daily., Disp: 60 tablet, Rfl: 0 .  levothyroxine (SYNTHROID) 112 MCG tablet, Take 1 tablet (112 mcg total) by mouth daily at 6 (six) AM., Disp: 30 tablet, Rfl: 0 .  loperamide (IMODIUM) 2 MG capsule, Take 1 capsule (2 mg total) by mouth as needed for diarrhea or loose stools., Disp: 30 capsule, Rfl: 0  Patients Current Diet:  Diet Order            Diet - low sodium heart healthy        DIET DYS 3 Room service appropriate? Yes with Assist; Fluid consistency: Thin  Diet effective now              Precautions / Restrictions Precautions Precautions: Fall Restrictions Weight Bearing Restrictions: No   Has the patient had 2 or more falls   or a fall with injury in the past year?Yes  Prior Activity Level Limited Community (1-2x/wk): not working, did drive some, independent ADLs and ambulation, did use rollator out in the community  Prior  Functional Level Prior Function Level of Independence: Needs assistance Gait / Transfers Assistance Needed: uses Radiation protection practitioner for community ambulation, furniture walks indoors ADL's / Homemaking Assistance Needed: In the last couple of weeks, pt requiring increased assist, including feeding, dressing Comments: history of recent fall  Self Care: Did the patient need help bathing, dressing, using the toilet or eating?  Independent  Indoor Mobility: Did the patient need assistance with walking from room to room (with or without device)? Independent  Stairs: Did the patient need assistance with internal or external stairs (with or without device)? Independent  Functional Cognition: Did the patient need help planning regular tasks such as shopping or remembering to take medications? Needed some help  Home Assistive Devices / Equipment Home Equipment: Walker - 4 wheels, Shower seat, Grab bars - tub/shower  Prior Device Use: Indicate devices/aids used by the patient prior to current illness, exacerbation or injury? rollator only for community ambulation   Current Functional Level Cognition  Overall Cognitive Status: Impaired/Different from baseline Orientation Level: Oriented X4 Following Commands: Follows one step commands inconsistently, Follows one step commands with increased time Safety/Judgement: Decreased awareness of safety, Decreased awareness of deficits General Comments: Not formally assessed but short term memory deficits remain.    Extremity Assessment (includes Sensation/Coordination)  Upper Extremity Assessment: Generalized weakness  Lower Extremity Assessment: Defer to PT evaluation RLE Deficits / Details: Grossly 4/5 RLE Coordination: decreased gross motor LLE Deficits / Details: Grossly 4/5 LLE Coordination: decreased gross motor    ADLs  Overall ADL's : Needs assistance/impaired Eating/Feeding: Set up, Sitting Grooming: Wash/dry hands, Wash/dry face, Oral care, Set  up, Sitting Upper Body Bathing: Minimal assistance, Sitting Lower Body Bathing: Moderate assistance, Sit to/from stand Upper Body Dressing : Minimal assistance, Sitting Lower Body Dressing: Moderate assistance, Sit to/from stand, Maximal assistance Lower Body Dressing Details (indicate cue type and reason): Required assist for threading BLE through underwear Toilet Transfer: Minimal assistance, Ambulation, RW Toilet Transfer Details (indicate cue type and reason): ambulation to [facility] Toileting- Clothing Manipulation and Hygiene: Moderate assistance, Sit to/from stand Toileting - Clothing Manipulation Details (indicate cue type and reason): Mod A for posterior peri hygiene Functional mobility during ADLs: Minimal assistance, Rolling walker General ADL Comments: Pt limited by anxiety (baseline) and balance deficits    Mobility  Overal bed mobility: Needs Assistance Bed Mobility: Supine to Sit, Sit to Supine Supine to sit: Supervision, HOB elevated Sit to supine: Supervision, HOB elevated General bed mobility comments: Supervision with heavy use of rail and HOB elevated.    Transfers  Overall transfer level: Needs assistance Equipment used: Rolling walker (2 wheeled) Transfers: Sit to/from Stand Sit to Stand: Min guard General transfer comment: Cues for hand placement to and from seated surface.  NO LOB noted this session.    Ambulation / Gait / Stairs / Wheelchair Mobility  Ambulation/Gait Ambulation/Gait assistance: Min assist, Mod assist(Initially min assistance, as distance progressed fatigue noted and required increased assistance.) Gait Distance (Feet): 70 Feet Assistive device: Rolling walker (2 wheeled) Gait Pattern/deviations: Step-through pattern, Decreased stride length, Wide base of support, Decreased dorsiflexion - right, Decreased dorsiflexion - left, Shuffle General Gait Details: Pt required cues for upper trunk control and increasing stride length.  She required assistance  to maintain path of RW and to turn and back. Pt  presents with a more shuffling pattern with cues for B foot clearance. Gait velocity: decreased Gait velocity interpretation: <1.8 ft/sec, indicate of risk for recurrent falls    Posture / Balance Dynamic Sitting Balance Sitting balance - Comments: Cueing required for upright posture/core stabilization Balance Overall balance assessment: Needs assistance Sitting-balance support: Feet supported Sitting balance-Leahy Scale: Fair Sitting balance - Comments: Cueing required for upright posture/core stabilization Standing balance support: Bilateral upper extremity supported, During functional activity Standing balance-Leahy Scale: Poor Standing balance comment: reliant on external support    Special needs/care consideration BiPAP/CPAP: no CPM: no Continuous Drip IV: 0.45% sodium chloride infusion Dialysis: no        Days: no Life Vest; No Oxygen: no Special Bed: no Trach Size: no Wound Vac (area): no      Location: no Skin: abrasion to right knee, ecchymosis to right and left knee           Bowel mgmt: last BM: 04/26/19, continent  Bladder mgmt: incontinent, external urinary catheter in place Diabetic mgmt: no Behavioral consideration : some encephalopathy, history of bipolar Chemo/radiation: no     Previous Home Environment (from acute therapy documentation) Living Arrangements: Spouse/significant other, Children(daughter) Available Help at Discharge: Family, Available 24 hours/day Type of Home: House Home Layout: One level Home Access: Stairs to enter Entrance Stairs-Number of Steps: 3 Bathroom Shower/Tub: Walk-in shower Bathroom Toilet: Standard  Discharge Living Setting Plans for Discharge Living Setting: Patient's home, House, Lives with (comment)(husband and adult daugther) Type of Home at Discharge: House Discharge Home Layout: One level Discharge Home Access: Stairs to enter Entrance Stairs-Rails: Can reach  both Entrance Stairs-Number of Steps: 4 Discharge Bathroom Shower/Tub: Walk-in shower(grab bar, 2-3 in step ledge, built in seat) Discharge Bathroom Toilet: Standard Discharge Bathroom Accessibility: Yes How Accessible: Accessible via walker Does the patient have any problems obtaining your medications?: No  Social/Family/Support Systems Patient Roles: Spouse Contact Information: husband (Walter): [phone] Anticipated Caregiver: husband and adult daughter Anticipated Caregiver's Contact Information: see above Ability/Limitations of Caregiver: Min A Caregiver Availability: 24/7 Discharge Plan Discussed with Primary Caregiver: Yes Is Caregiver In Agreement with Plan?: Yes Does Caregiver/Family have Issues with Lodging/Transportation while Pt is in Rehab?: No   Goals/Additional Needs Patient/Family Goal for Rehab: PT/OT: Supervision; SLP: Superviison with med mangement Expected length of stay: 8-12 days Cultural Considerations: NA Dietary Needs: DYS 3, thin liquids. Room service with assist  Equipment Needs: TBD Special Service Needs: neuropsych consult recommended by attending on acute Pt/Family Agrees to Admission and willing to participate: Yes Program Orientation Provided & Reviewed with Pt/Caregiver Including Roles  & Responsibilities: Yes(pt and her husband)  Barriers to Discharge: Home environment access/layout  Barriers to Discharge Comments: steps to enter home.    Decrease burden of Care through IP rehab admission: NA   Possible need for SNF placement upon discharge:Not anticipated. Pt has supportive husband who is able to provide 24/7 A and is able to be at home with pt at DC. Anticipate pt can reach supervision level quickly and can be managed at home after CIR stay.    Patient Condition: This patient's medical and functional status has changed since the consult dated: 04/24/19 in which the Rehabilitation Physician determined and documented that the patient's  condition is appropriate for intensive rehabilitative care in an inpatient rehabilitation facility. Medical changes are: see H&R.  Functional changes are: improvement in gait from 15 feet with Mod A +2 to Min/Mod A for 70 feet.  After evaluating the patient today and   speaking with the Rehabilitation physician and acute team, the patient remains appropriate for inpatient rehab. Will admit to inpatient rehab today.  Preadmission Screen Completed By:   Gentry, OT, 04/26/2019 2:29 PM ______________________________________________________________________   Discussed status with Dr. Raulkar on 04/26/19 at 2:29PM and received approval for admission today.  Admission Coordinator:   Gentry, time 2:29PM/Date 04/26/19  

## 2019-04-26 NOTE — Progress Notes (Signed)
Physical Therapy Treatment Patient Details Name: Tiffany [name] MRN: [id number] DOB: 19-Dec-1950 Today's Date: 04/26/2019    History of Present Illness 69 y.o. female with medical history significant of hypothyroidism, bipolar 1 disorder, psoriatic arthritis, COVID-19 positive on 1/23.  She presents after having a fall this morning at home unable to get up and family notes worsening confusion.  MRI showing no acute findings.    PT Comments    Pt performed gt training and functional mobility with improved ability requiring decreased assistance intially. As patient began to fatigue she required increased assistance to mobilize backl to room.  Continue to recommend aggressive CIR therapies at this time.    Follow Up Recommendations  CIR     Equipment Recommendations  Rolling walker with 5" wheels;Wheelchair (measurements PT);3in1 (PT);Wheelchair cushion (measurements PT)    Recommendations for Other Services Rehab consult     Precautions / Restrictions Precautions Precautions: Fall Restrictions Weight Bearing Restrictions: No    Mobility  Bed Mobility Overal bed mobility: Needs Assistance Bed Mobility: Supine to Sit;Sit to Supine     Supine to sit: Supervision;HOB elevated Sit to supine: Supervision;HOB elevated   General bed mobility comments: Supervision with heavy use of rail and HOB elevated.  Transfers Overall transfer level: Needs assistance Equipment used: Rolling walker (2 wheeled) Transfers: Sit to/from Stand Sit to Stand: Min guard         General transfer comment: Cues for hand placement to and from seated surface.  NO LOB noted this session.  Ambulation/Gait Ambulation/Gait assistance: Min assist;Mod assist(Initially min assistance, as distance progressed fatigue noted and required increased assistance.) Gait Distance (Feet): 70 Feet Assistive device: Rolling walker (2 wheeled) Gait Pattern/deviations: Step-through pattern;Decreased stride length;Wide base  of support;Decreased dorsiflexion - right;Decreased dorsiflexion - left;Shuffle Gait velocity: decreased   General Gait Details: Pt required cues for upper trunk control and increasing stride length.  She required assistance to maintain path of RW and to turn and back. Pt presents with a more shuffling pattern with cues for B foot clearance.   Stairs             Wheelchair Mobility    Modified Rankin (Stroke Patients Only)       Balance Overall balance assessment: Needs assistance Sitting-balance support: Feet supported Sitting balance-Leahy Scale: Fair Sitting balance - Comments: Cueing required for upright posture/core stabilization   Standing balance support: Bilateral upper extremity supported;During functional activity Standing balance-Leahy Scale: Poor Standing balance comment: reliant on external support                            Cognition Arousal/Alertness: Awake/alert Behavior During Therapy: WFL for tasks assessed/performed Overall Cognitive Status: Impaired/Different from baseline Area of Impairment: Safety/judgement;Following commands;Problem solving;Memory                 Orientation Level: Disoriented to;Time   Memory: Decreased short-term memory Following Commands: Follows one step commands inconsistently;Follows one step commands with increased time Safety/Judgement: Decreased awareness of safety;Decreased awareness of deficits Awareness: Emergent Problem Solving: Slow processing;Requires verbal cues General Comments: Not formally assessed but short term memory deficits remain.      Exercises General Exercises - Lower Extremity Hip Flexion/Marching: AROM;Both;10 reps;Supine    General Comments General comments (skin integrity, edema, etc.): Husband present throughout session and very supportive      Pertinent Vitals/Pain Pain Assessment: No/denies pain Faces Pain Scale: Hurts little more Pain Location: bilateral knees with  movement R>L Pain  Descriptors / Indicators: Discomfort;Grimacing Pain Intervention(s): Monitored during session;Repositioned    Home Living                      Prior Function            PT Goals (current goals can now be found in the care plan section) Acute Rehab PT Goals Patient Stated Goal: "To go to rehab" Potential to Achieve Goals: Good Progress towards PT goals: Progressing toward goals    Frequency    Min 3X/week      PT Plan Current plan remains appropriate    Co-evaluation              AM-PAC PT "6 Clicks" Mobility   Outcome Measure  Help needed turning from your back to your side while in a flat bed without using bedrails?: A Little Help needed moving from lying on your back to sitting on the side of a flat bed without using bedrails?: A Little Help needed moving to and from a bed to a chair (including a wheelchair)?: A Little Help needed standing up from a chair using your arms (e.g., wheelchair or bedside chair)?: A Little Help needed to walk in hospital room?: A Lot Help needed climbing 3-5 steps with a railing? : A Lot 6 Click Score: 16    End of Session Equipment Utilized During Treatment: Gait belt Activity Tolerance: Patient tolerated treatment well Patient left: in chair;with call bell/phone within reach;with chair alarm set Nurse Communication: Mobility status PT Visit Diagnosis: Unsteadiness on feet (R26.81);Difficulty in walking, not elsewhere classified (R26.2);Muscle weakness (generalized) (M62.81)     Time: 7062-3762 PT Time Calculation (min) (ACUTE ONLY): 21 min  Charges:  $Gait Training: 8-22 mins                     Erasmo Leventhal , PTA Acute Rehabilitation Services Pager [phone] Office [phone]     Tiffany [name] 04/26/2019, 1:29 PM

## 2019-04-26 NOTE — Progress Notes (Signed)
Patient transported via [place] in stable condition to 4W room 25. Call light given to patient. Bed placed in low position. Husband at bedside.

## 2019-04-26 NOTE — Progress Notes (Signed)
Occupational Therapy Treatment Patient Details Name: Tiffany [name] MRN: [id number] DOB: 28-Aug-1950 Today's Date: 04/26/2019    History of present illness 69 y.o. female with medical history significant of hypothyroidism, bipolar 1 disorder, psoriatic arthritis, COVID-19 positive on 1/23.  She presents after having a fall this morning at home unable to get up and family notes worsening confusion.  MRI showing no acute findings.   OT comments  Pt progressing nicely toward OT goals. Pt able to complete ambulatory transfers around room with RW with min A overall. Mod A still required for toileting tasks and LB dressing. Pt limited by anxiety, dynamic standing balance deficits, and cognitive deficits. Pt remains motivated with a very supportive husband who would like to pursue CIR.    Follow Up Recommendations  CIR;Supervision/Assistance - 24 hour    Equipment Recommendations  None recommended by OT       Precautions / Restrictions Precautions Precautions: Fall Restrictions Weight Bearing Restrictions: No       Mobility Bed Mobility Overal bed mobility: Needs Assistance Bed Mobility: Supine to Sit;Sit to Supine     Supine to sit: Supervision;HOB elevated Sit to supine: Supervision;HOB elevated      Transfers Overall transfer level: Needs assistance Equipment used: Rolling walker (2 wheeled) Transfers: Sit to/from Stand Sit to Stand: Min assist         General transfer comment: Pt independently recalling hand placement but then requiring min cueing to carryover    Balance Overall balance assessment: Needs assistance Sitting-balance support: Feet supported Sitting balance-Leahy Scale: Fair Sitting balance - Comments: Cueing required for upright posture/core stabilization   Standing balance support: Bilateral upper extremity supported;During functional activity Standing balance-Leahy Scale: Poor Standing balance comment: reliant on external support                            ADL either performed or assessed with clinical judgement   ADL Overall ADL's : Needs assistance/impaired     Grooming: Wash/dry hands;Wash/dry face;Oral care;Set up;Sitting               Lower Body Dressing: Moderate assistance;Sit to/from stand;Maximal assistance Lower Body Dressing Details (indicate cue type and reason): Required assist for threading BLE through underwear Toilet Transfer: Minimal assistance;Ambulation;RW Toilet Transfer Details (indicate cue type and reason): ambulation to [facility] Toileting- Clothing Manipulation and Hygiene: Moderate assistance;Sit to/from stand Toileting - Clothing Manipulation Details (indicate cue type and reason): Mod A for posterior peri hygiene     Functional mobility during ADLs: Minimal assistance;Rolling walker General ADL Comments: Pt limited by anxiety (baseline) and balance deficits               Cognition Arousal/Alertness: Awake/alert Behavior During Therapy: WFL for tasks assessed/performed Overall Cognitive Status: Impaired/Different from baseline Area of Impairment: Orientation;Safety/judgement;Awareness                 Orientation Level: Disoriented to;Time   Memory: Decreased short-term memory   Safety/Judgement: Decreased awareness of safety;Decreased awareness of deficits Awareness: Emergent Problem Solving: Slow processing;Requires verbal cues General Comments: More alert and following commands consistently this session, not oriented to year.         Exercises     Shoulder Instructions       General Comments Husband present throughout session and very supportive    Pertinent Vitals/ Pain       Pain Assessment: No/denies pain     Prior Functioning/Environment  Frequency  Min 2X/week        Progress Toward Goals  OT Goals(current goals can now be found in the care plan section)  Progress towards OT goals: Progressing toward goals  Acute Rehab OT  Goals Patient Stated Goal: "to get this diarrhea under control" OT Goal Formulation: With patient Time For Goal Achievement: 05/07/19 Potential to Achieve Goals: Good  Plan Discharge plan remains appropriate       AM-PAC OT "6 Clicks" Daily Activity     Outcome Measure   Help from another person eating meals?: None Help from another person taking care of personal grooming?: A Little Help from another person toileting, which includes using toliet, bedpan, or urinal?: A Lot Help from another person bathing (including washing, rinsing, drying)?: A Lot Help from another person to put on and taking off regular upper body clothing?: A Little Help from another person to put on and taking off regular lower body clothing?: A Lot 6 Click Score: 16    End of Session Equipment Utilized During Treatment: Rolling walker  OT Visit Diagnosis: Unsteadiness on feet (R26.81);Other abnormalities of gait and mobility (R26.89);Muscle weakness (generalized) (M62.81);Pain   Activity Tolerance Patient tolerated treatment well   Patient Left in bed;with call bell/phone within reach;with bed alarm set;with family/visitor present   Nurse Communication Mobility status;Other (comment)(need for immodium)        Time: 2947-6546 OT Time Calculation (min): 43 min  Charges: OT General Charges $OT Visit: 1 Visit OT Treatments $Self Care/Home Management : 38-52 mins    Curtis Sites OTR/L  04/26/2019, 10:19 AM

## 2019-04-26 NOTE — Progress Notes (Signed)
Tiffany [name], OT  Rehab Admission Coordinator  Physical Medicine and Rehabilitation  PMR Pre-admission  Signed  Date of Service:  04/26/2019  2:05 PM      Related encounter: ED to Hosp-Admission (Discharged) from 04/21/2019 in [place]      Signed        PMR Admission Coordinator Pre-Admission Assessment   Patient: Tiffany [name] is an 69 y.o., female MRN: [id number] DOB: 08/12/1950 Height: 5' 4"  (162.6 cm) Weight: 90.3 kg                                                                                                                                                  Insurance Information HMO:     PPO:      PCP:      IPA:      80/20: yes     OTHER:  PRIMARY: Medicare part A and B      Policy#: [id number]      Subscriber: Patient CM Name:       Phone#:      Fax#:  Pre-Cert#:       Employer:  Benefits:  Phone #: verified online     Name: verified eligibility via [place] on 04/26/19 Eff. Date: Part A effective 03/12/2015; part B effective 12/10/2015     Deduct: $1,484      Out of Pocket Max: NA      Life Max: NA CIR: Covered per Medicare guidelines once yearly deductible has been met.       SNF: days 1-20, 100%, days 21-100, 80% Outpatient: 80%     Co-Pay: 20% Home Health: 100%      Co-Pay:  DME: 80%     Co-Pay: 20% Providers: Pt's choice SECONDARY: [employer]       Policy#: [id number]      Subscriber: Patient CM Name:       Phone#:      Fax#:  Pre-Cert#:       Employer:  Benefits:  Phone #: [phone]     Name:  Eff. Date:      Deduct:       Out of Pocket Max:       Life Max:  CIR:       SNF:  Outpatient:      Co-Pay:  Home Health:       Co-Pay:  DME:      Co-Pay:    Medicaid Application Date:       Case Manager:  Disability Application Date:       Case Worker:    The "Data Collection Information Summary" for patients in Inpatient Rehabilitation Facilities with attached "Privacy Act Trainer Records" was provided and verbally reviewed with:  Patient and Family   Emergency Contact Information         Contact Information  Name Relation Home Work [place] Spouse     [phone]       Current Medical History  Patient Admitting Diagnosis: Debility and encephalopathy in a patient with Parkinson's disease with recent Covid infection.   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  Family reported waxing and waning of confusion that's usually worse at nights and patient with fall with concerns of stroke therefore admitted for work up.  Patient moved from [place] 08/2018 (Son with [employer]) and has history of intermittent lithium toxicity.  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 Dr. Leonel Ramsay consulted and expressed concern of seizures as well as lithium toxicity. Encephalopathy seen in setting of Covid and would likely take weeks/months to recover. Lithium level-0.87 and Lamictal- 15.3. TSH-0.095/T3-2.4/T4-1.43. Synthroid and Lamictal held as question contributing to tremors/encephalopathy. Noted to have leucocytosis at admission with WBC-17.8--> BC/UA negative and acute on chronic renal failure with SCr-2.67.    LT EEG showed left temporoparietal sharp waves in addition to mild to moderate diffuse encephalopathy. Low dose Keppra added in addition to resuming lower dose Lamictal. Acute on chronic renal failure with hypernatremia has resolved with D5. As T4 levels down to 1.4-->synthroid resumed at lower dose on 2/13. Psychiatry consulted for input on medications changes to help manage anxiety and depression. Lamictal decreased to 100 mg/HS and addition of low dose  Sertraline. Neurology however recommended discontinuation of latter as mentation improving and to minimize medications. To use lorazepam prn for anxiety/panic attacks. .  Tremors have improved and patient to follow up with Dr. Carles Collet after discharge. Therapy evaluations completed revealing balance deficits with significant weakness, cognitive deficits with delayed processing and poor awareness of deficits. CIR recommended due to recent functional decline. Pt is to admit to CIR on 04/26/19.       Glasgow Coma Scale Score: 15   Past Medical History  Past Medical History:  Diagnosis Date  . Bipolar 1 disorder ([place])    . Breast cancer ([place])    . Chronic diarrhea      loose stools twice a day on average for years  . Chronic kidney disease (CKD), stage III (moderate)    . Depression 1987  . Parkinson's disease ([place]) 2012  . Psoriatic arthritis ([place])    . PTSD (post-traumatic stress disorder)    . Secondary hyperparathyroidism ([place])    . Tardive dyskinesia        Family History  family history includes Cancer in her mother, sister, and sister; Diabetes in her paternal grandfather; Stroke in her maternal grandfather.   Prior Rehab/Hospitalizations:  Has the patient had prior rehab or hospitalizations prior to admission? No   Has the patient had major surgery during 100 days prior to admission? No   Current Medications    Current Facility-Administered Medications:  .  0.45 % sodium chloride infusion, , Intravenous, Continuous, Regalado, Belkys A, MD, Last Rate: 50 mL/hr at 04/25/19 1601, New Bag at 04/25/19 1601 .  acetaminophen (TYLENOL) tablet 650 mg, 650 mg, Oral, Q6H PRN **OR** acetaminophen (TYLENOL) suppository 650 mg, 650 mg, Rectal, Q6H PRN, Smith, Rondell A, MD .  albuterol (PROVENTIL) (2.5 MG/3ML) 0.083%  nebulizer solution 2.5 mg, 2.5 mg, Nebulization, Q6H PRN, Smith, Rondell A, MD .  amLODipine (NORVASC) tablet 2.5 mg, 2.5 mg, Oral, Daily, Regalado, Belkys A, MD, 2.5 mg at 04/26/19  1045 .  aspirin EC tablet 81 mg, 81 mg, Oral, Daily, Greta Doom, MD, 81 mg at 04/26/19 1045 .  calcitRIOL (ROCALTROL) capsule 0.25 mcg, 0.25 mcg, Oral, Q M,W,F, Smith, Rondell A, MD, 0.25 mcg at 04/26/19 1046 .  clonazePAM (KLONOPIN) tablet 0.5 mg, 0.5 mg, Oral, QHS, Smith, Rondell A, MD, 0.5 mg at 04/25/19 2248 .  heparin injection 5,000 Units, 5,000 Units, Subcutaneous, Q8H, Smith, Rondell A, MD, 5,000 Units at 04/26/19 [phone] .  hydrALAZINE (APRESOLINE) tablet 10 mg, 10 mg, Oral, Q8H PRN, Regalado, Belkys A, MD, 10 mg at 04/25/19 2257 .  lamoTRIgine (LAMICTAL) tablet 100 mg, 100 mg, Oral, QHS, Lora Havens, MD, 100 mg at 04/25/19 2247 .  levETIRAcetam (KEPPRA) tablet 250 mg, 250 mg, Oral, BID, Lora Havens, MD, 250 mg at 04/26/19 1045 .  levothyroxine (SYNTHROID) tablet 112 mcg, 112 mcg, Oral, Q0600, Regalado, Belkys A, MD, 112 mcg at 04/26/19 1045 .  lidocaine (LIDODERM) 5 % 1 patch, 1 patch, Transdermal, Daily PRN, Smith, Rondell A, MD .  loperamide (IMODIUM) capsule 2 mg, 2 mg, Oral, PRN, Regalado, Belkys A, MD, 2 mg at 04/26/19 1048 .  LORazepam (ATIVAN) injection 1 mg, 1 mg, Intravenous, Q6H PRN, Regalado, Belkys A, MD .  LORazepam (ATIVAN) tablet 0.5 mg, 0.5 mg, Oral, Daily PRN, Regalado, Belkys A, MD .  nystatin (MYCOSTATIN) 100000 UNIT/ML suspension 500,000 Units, 5 mL, Oral, QID, Regalado, Belkys A, MD, 500,000 Units at 04/26/19 1000 .  ondansetron (ZOFRAN) tablet 4 mg, 4 mg, Oral, Q6H PRN **OR** ondansetron (ZOFRAN) injection 4 mg, 4 mg, Intravenous, Q6H PRN, Smith, Rondell A, MD .  sodium bicarbonate tablet 650 mg, 650 mg, Oral, BID, Regalado, Belkys A, MD, 650 mg at 04/26/19 1046 .  sodium chloride flush (NS) 0.9 % injection 3 mL, 3 mL, Intravenous, Q12H, Smith, Rondell A, MD, 3 mL at 04/26/19 1050   Current Outpatient Medications:  .  acetaminophen (TYLENOL) 500 MG tablet, Take 500 mg by mouth daily as needed for headache (pain). , Disp: , Rfl:  .  AZELAIC ACID  EX, Apply 1 application topically daily. 15% gel, apply to face., Disp: , Rfl:  .  calcitRIOL (ROCALTROL) 0.25 MCG capsule, Take 0.25 mcg by mouth every Monday, Wednesday, and Friday. , Disp: , Rfl:  .  Certolizumab Pegol (CIMZIA) 2 X 200 MG KIT, Inject 200 mg into the skin every 14 (fourteen) days. Administered by [facility] - for psoriatic arthritis , Disp: , Rfl:  .  Cholecalciferol 50 MCG (2000 UT) TABS, Take 1-2 tablets by mouth See admin instructions. Take 1 tablet by mouth 1st day, then take 2 tablets by mouth 2nd day, then repeat, Disp: , Rfl:  .  Lidocaine 4 % PTCH, Apply 1 patch topically See admin instructions. Apply up to 3 times daily as needed for pain., Disp: , Rfl:  .  LORazepam (ATIVAN) 0.5 MG tablet, Take 1 tablet (0.5 mg total) by mouth daily as needed. (Patient taking differently: Take 0.5 mg by mouth daily as needed for anxiety. ), Disp: 30 tablet, Rfl: 2 .  sodium bicarbonate 650 MG tablet, Take 650 mg by mouth 2 (two) times daily., Disp: , Rfl:  .  amLODipine (NORVASC) 2.5 MG tablet, Take 1 tablet (2.5 mg total) by mouth daily., Disp: 30  tablet, Rfl: 0 .  aspirin EC 81 MG EC tablet, Take 1 tablet (81 mg total) by mouth daily., Disp: 30 tablet, Rfl: 0 .  clonazePAM (KLONOPIN) 0.5 MG tablet, Take 1 tablet (0.5 mg total) by mouth at bedtime., Disp: 30 tablet, Rfl: 0 .  lamoTRIgine (LAMICTAL) 100 MG tablet, Take 1 tablet (100 mg total) by mouth at bedtime., Disp: 30 tablet, Rfl: 0 .  levETIRAcetam (KEPPRA) 250 MG tablet, Take 1 tablet (250 mg total) by mouth 2 (two) times daily., Disp: 60 tablet, Rfl: 0 .  levothyroxine (SYNTHROID) 112 MCG tablet, Take 1 tablet (112 mcg total) by mouth daily at 6 (six) AM., Disp: 30 tablet, Rfl: 0 .  loperamide (IMODIUM) 2 MG capsule, Take 1 capsule (2 mg total) by mouth as needed for diarrhea or loose stools., Disp: 30 capsule, Rfl: 0   Patients Current Diet:     Diet Order                      Diet - low sodium heart healthy            DIET DYS 3 Room service appropriate? Yes with Assist; Fluid consistency: Thin  Diet effective now                   Precautions / Restrictions Precautions Precautions: Fall Restrictions Weight Bearing Restrictions: No    Has the patient had 2 or more falls or a fall with injury in the past year?Yes   Prior Activity Level Limited Community (1-2x/wk): not working, did drive some, independent ADLs and ambulation, did use rollator out in the community   Prior Functional Level Prior Function Level of Independence: Needs assistance Gait / Transfers Assistance Needed: uses Radiation protection practitioner for community ambulation, furniture walks indoors ADL's / Homemaking Assistance Needed: In the last couple of weeks, pt requiring increased assist, including feeding, dressing Comments: history of recent fall   Self Care: Did the patient need help bathing, dressing, using the toilet or eating?  Independent   Indoor Mobility: Did the patient need assistance with walking from room to room (with or without device)? Independent   Stairs: Did the patient need assistance with internal or external stairs (with or without device)? Independent   Functional Cognition: Did the patient need help planning regular tasks such as shopping or remembering to take medications? Needed some help   Home Assistive Devices / Equipment Home Equipment: Walker - 4 wheels, Shower seat, Grab bars - tub/shower   Prior Device Use: Indicate devices/aids used by the patient prior to current illness, exacerbation or injury? rollator only for community ambulation    Current Functional Level Cognition   Overall Cognitive Status: Impaired/Different from baseline Orientation Level: Oriented X4 Following Commands: Follows one step commands inconsistently, Follows one step commands with increased time Safety/Judgement: Decreased awareness of safety, Decreased awareness of deficits General Comments: Not formally assessed but short  term memory deficits remain.    Extremity Assessment (includes Sensation/Coordination)   Upper Extremity Assessment: Generalized weakness  Lower Extremity Assessment: Defer to PT evaluation RLE Deficits / Details: Grossly 4/5 RLE Coordination: decreased gross motor LLE Deficits / Details: Grossly 4/5 LLE Coordination: decreased gross motor     ADLs   Overall ADL's : Needs assistance/impaired Eating/Feeding: Set up, Sitting Grooming: Wash/dry hands, Wash/dry face, Oral care, Set up, Sitting Upper Body Bathing: Minimal assistance, Sitting Lower Body Bathing: Moderate assistance, Sit to/from stand Upper Body Dressing : Minimal assistance, Sitting Lower Body Dressing:  Moderate assistance, Sit to/from stand, Maximal assistance Lower Body Dressing Details (indicate cue type and reason): Required assist for threading BLE through underwear Toilet Transfer: Minimal assistance, Ambulation, RW Toilet Transfer Details (indicate cue type and reason): ambulation to [facility] Toileting- Clothing Manipulation and Hygiene: Moderate assistance, Sit to/from stand Toileting - Clothing Manipulation Details (indicate cue type and reason): Mod A for posterior peri hygiene Functional mobility during ADLs: Minimal assistance, Rolling walker General ADL Comments: Pt limited by anxiety (baseline) and balance deficits     Mobility   Overal bed mobility: Needs Assistance Bed Mobility: Supine to Sit, Sit to Supine Supine to sit: Supervision, HOB elevated Sit to supine: Supervision, HOB elevated General bed mobility comments: Supervision with heavy use of rail and HOB elevated.     Transfers   Overall transfer level: Needs assistance Equipment used: Rolling walker (2 wheeled) Transfers: Sit to/from Stand Sit to Stand: Min guard General transfer comment: Cues for hand placement to and from seated surface.  NO LOB noted this session.     Ambulation / Gait / Stairs / Wheelchair Mobility    Ambulation/Gait Ambulation/Gait assistance: Min assist, Mod assist(Initially min assistance, as distance progressed fatigue noted and required increased assistance.) Gait Distance (Feet): 70 Feet Assistive device: Rolling walker (2 wheeled) Gait Pattern/deviations: Step-through pattern, Decreased stride length, Wide base of support, Decreased dorsiflexion - right, Decreased dorsiflexion - left, Shuffle General Gait Details: Pt required cues for upper trunk control and increasing stride length.  She required assistance to maintain path of RW and to turn and back. Pt presents with a more shuffling pattern with cues for B foot clearance. Gait velocity: decreased Gait velocity interpretation: <1.8 ft/sec, indicate of risk for recurrent falls     Posture / Balance Dynamic Sitting Balance Sitting balance - Comments: Cueing required for upright posture/core stabilization Balance Overall balance assessment: Needs assistance Sitting-balance support: Feet supported Sitting balance-Leahy Scale: Fair Sitting balance - Comments: Cueing required for upright posture/core stabilization Standing balance support: Bilateral upper extremity supported, During functional activity Standing balance-Leahy Scale: Poor Standing balance comment: reliant on external support     Special needs/care consideration BiPAP/CPAP: no CPM: no Continuous Drip IV: 0.45% sodium chloride infusion Dialysis: no        Days: no Life Vest; No Oxygen: no Special Bed: no Trach Size: no Wound Vac (area): no      Location: no Skin: abrasion to right knee, ecchymosis to right and left knee           Bowel mgmt: last BM: 04/26/19, continent  Bladder mgmt: incontinent, external urinary catheter in place Diabetic mgmt: no Behavioral consideration : some encephalopathy, history of bipolar Chemo/radiation: no        Previous Home Environment (from acute therapy documentation) Living Arrangements: Spouse/significant other,  Children(daughter) Available Help at Discharge: Family, Available 24 hours/day Type of Home: House Home Layout: One level Home Access: Stairs to enter Technical brewer of Steps: 3 Bathroom Shower/Tub: Multimedia programmer: Standard   Discharge Living Setting Plans for Discharge Living Setting: Patient's home, House, Lives with (comment)(husband and adult daugther) Type of Home at Discharge: House Discharge Home Layout: One level Discharge Home Access: Stairs to enter Entrance Stairs-Rails: Can reach both Entrance Stairs-Number of Steps: 4 Discharge Bathroom Shower/Tub: Walk-in shower(grab bar, 2-3 in step ledge, built in seat) Discharge Bathroom Toilet: Standard Discharge Bathroom Accessibility: Yes How Accessible: Accessible via walker Does the patient have any problems obtaining your medications?: No   Social/Family/Support Systems Patient Roles:  Spouse Contact Information: husband [name]): [phone] Anticipated Caregiver: husband and adult daughter Anticipated Caregiver's Contact Information: see above Ability/Limitations of Caregiver: Min A Caregiver Availability: 24/7 Discharge Plan Discussed with Primary Caregiver: Yes Is Caregiver In Agreement with Plan?: Yes Does Caregiver/Family have Issues with Lodging/Transportation while Pt is in Rehab?: No     Goals/Additional Needs Patient/Family Goal for Rehab: PT/OT: Supervision; SLP: [place] with med mangement Expected length of stay: 8-12 days Cultural Considerations: NA Dietary Needs: DYS 3, thin liquids. Room service with assist  Equipment Needs: TBD Special Service Needs: neuropsych consult recommended by attending on acute Pt/Family Agrees to Admission and willing to participate: Yes Program Orientation Provided & Reviewed with Pt/Caregiver Including Roles  & Responsibilities: Yes(pt and her husband)  Barriers to Discharge: Home environment access/layout  Barriers to Discharge Comments: steps  to enter home.      Decrease burden of Care through IP rehab admission: NA     Possible need for SNF placement upon discharge:Not anticipated. Pt has supportive husband who is able to provide 24/7 A and is able to be at home with pt at DC. Anticipate pt can reach supervision level quickly and can be managed at home after CIR stay.      Patient Condition: This patient's medical and functional status has changed since the consult dated: 04/24/19 in which the Rehabilitation Physician determined and documented that the patient's condition is appropriate for intensive rehabilitative care in an inpatient rehabilitation facility. Medical changes are: see H&R.  Functional changes are: improvement in gait from 15 feet with Mod A +2 to Min/Mod A for 70 feet.  After evaluating the patient today and speaking with the Rehabilitation physician and acute team, the patient remains appropriate for inpatient rehab. Will admit to inpatient rehab today.   Preadmission Screen Completed By:  Tiffany [name], OT, 04/26/2019 2:29 PM ______________________________________________________________________   Discussed status with Dr. Ranell Patrick on 04/26/19 at 2:29PM and received approval for admission today.   Admission Coordinator:  Tiffany [name], time 2:29PM/Date 04/26/19         Cosigned by: Izora Ribas, MD at 04/26/2019  2:43 PM  Revision History

## 2019-04-26 NOTE — Progress Notes (Signed)
Tiffany Blake, MD  Physician  Physical Medicine and Rehabilitation  Consult Note  Signed  Date of Service:  04/24/2019 11:56 AM      Related encounter: ED to Hosp-Admission (Discharged) from 04/21/2019 in [facility]      Signed      Expand AllCollapse All   Physical Medicine and Rehabilitation Consult Reason for Consult:metabolic encephalopathy Referring Phsyician: Regalado Britt Theard is an 69 y.o. female.   HPI: 69 year old female with history of hyper thyroidism, bipolar disorder psoriatic arthritis.  She has a 1 to 87-monthhistory of increasing confusion.  She did have COVID-19 in January.  She is currently afebrile and her diarrhea has improved although she did have a 22 pound weight loss.  She had a fall on 04/21/2019 which led to ED evaluation Pain on 04/21/2019 demonstrated small chronic left frontal infarct as well as mild to moderate chronic small vessel disease changes bilaterally.  Chest x-ray showed some left lung base atelectasis.  Urine was negative.  Blood chemistries demonstrated low potassium but no other abnormalities.  Patient did initially have mildly elevated white count of 11 K but now is down to 8.5.  Patient evaluated by psychiatry on 04/23/2019.  Meds for bipolar were evaluated and lithium was discontinued. Physical therapy evaluation 04/23/2019 found patient to require 2 person mod assist for ambulation.  OT evaluation on 04/23/2019 demonstrated need for min assist for upper body ADLs and mod assist for lower body ADLs.   Review of Systems -  Review of Systems  Constitutional: Positive for malaise/fatigue and weight loss. Negative for chills and fever.  HENT: Negative for ear discharge, hearing loss and nosebleeds.   Eyes: Negative for discharge and redness.  Respiratory: Negative for cough, hemoptysis, sputum production, shortness of breath, wheezing and stridor.   Cardiovascular: Negative for chest pain.  Gastrointestinal: Positive for  diarrhea. Negative for abdominal pain, nausea and vomiting.  Genitourinary: Positive for frequency.  Musculoskeletal: Positive for joint pain.       Ankle pain   Skin: Negative for itching and rash.  Neurological: Positive for tremors. Negative for seizures and headaches.  Endo/Heme/Allergies: Does not bruise/bleed easily.  Psychiatric/Behavioral:       Hx bipolar d/o          Past Medical History:  Diagnosis Date  . Bipolar 1 disorder ([place]    . Depression 1987  . Parkinson's disease ([place] 2012  . Psoriatic arthritis ([place]    . PTSD (post-traumatic stress disorder)           Past Surgical History:  Procedure Laterality Date  . ABDOMINAL HYSTERECTOMY   1987  . CHOLECYSTECTOMY   1979  . DILATION AND CURETTAGE OF UTERUS   1973  . MASTECTOMY Bilateral 09/19/2014  . TONSILLECTOMY   1970  . URETERAL REIMPLANTION Bilateral 1974         Family History  Problem Relation Age of Onset  . Cancer Mother    . Cancer Sister    . Stroke Maternal Grandfather    . Diabetes Paternal Grandfather    . Cancer Sister      Social History:  reports that she has never smoked. She has never used smokeless tobacco. She reports that she does not drink alcohol or use drugs. Allergies:       Allergies  Allergen Reactions  . Aripiprazole Other (See Comments)      Parkinsonism      . Methotrexate Other (See Comments)  Hair loss, severe stomatitis      . Amoxicillin-Pot Clavulanate Other (See Comments)      Unknown reaction    . Cefdinir Other (See Comments)      Other reaction(s): Diarrhea Yeast infection and fever; negative c diff  . Etanercept Other (See Comments)      Headaches      . Exemestane Other (See Comments)      Suicidal thoughts with medication      . Fluoxetine Other (See Comments)      Parkinsonism  . Methylprednisolone Sodium Succ Other (See Comments)      Agitated mania  . Epinephrine Palpitations      tachycardia    . Nitrofurantoin Nausea And Vomiting  and Rash                 Medications Prior to Admission  Medication Sig Dispense Refill  . acetaminophen (TYLENOL) 500 MG tablet Take 500 mg by mouth daily as needed for headache (pain).       . AZELAIC ACID EX Apply 1 application topically daily. 15% gel, apply to face.      . calcitRIOL (ROCALTROL) 0.25 MCG capsule Take 0.25 mcg by mouth every Monday, Wednesday, and Friday.       . Certolizumab Pegol (CIMZIA) 2 X 200 MG KIT Inject 200 mg into the skin every 14 (fourteen) days. Administered by [facility] - for psoriatic arthritis       . Cholecalciferol 50 MCG (2000 UT) TABS Take 1-2 tablets by mouth See admin instructions. Take 1 tablet by mouth 1st day, then take 2 tablets by mouth 2nd day, then repeat      . clonazePAM (KLONOPIN) 0.5 MG tablet Take 1 to 2 tablets at bedtime. (Patient taking differently: Take 0.5-1 mg by mouth at bedtime. ) 60 tablet 2  . INGREZZA 40 MG CAPS Take 1 capsule by mouth at bedtime. (Patient taking differently: Take 40 mg by mouth at bedtime. ) 30 capsule 2  . lamoTRIgine (LAMICTAL) 100 MG tablet Take three tablets daily. (Patient taking differently: Take 300 mg by mouth at bedtime. Take three tablets daily.) 270 tablet 1  . levothyroxine (SYNTHROID) 125 MCG tablet Take 125 mcg by mouth daily before breakfast.       . Lidocaine 4 % PTCH Apply 1 patch topically See admin instructions. Apply up to 3 times daily as needed for pain.      Marland Kitchen lithium carbonate (LITHOBID) 300 MG CR tablet Take 300 mg by mouth at bedtime.       Marland Kitchen LORazepam (ATIVAN) 0.5 MG tablet Take 1 tablet (0.5 mg total) by mouth daily as needed. (Patient taking differently: Take 0.5 mg by mouth daily as needed for anxiety. ) 30 tablet 2  . sertraline (ZOLOFT) 25 MG tablet Take 2 tablets (50 mg total) by mouth daily. (Patient taking differently: Take 37.5 mg by mouth daily with breakfast. ) 180 tablet 1  . sodium bicarbonate 650 MG tablet Take 650 mg by mouth 2 (two) times daily.      .  vitamin B-12 (CYANOCOBALAMIN) 1000 MCG tablet Take 1 tablet by mouth daily with breakfast.           Home: Home Living Family/patient expects to be discharged to:: Private residence Living Arrangements: Spouse/significant other, Children(daughter) Available Help at Discharge: Family, Available 24 hours/day Type of Home: House Home Access: Stairs to enter [employer] of Steps: 3 Home Layout: One level Bathroom Shower/Tub: Multimedia programmer: Standard  Home Equipment: [place] - 4 wheels, Shower seat, Grab bars - tub/shower  Functional History: Prior Function Comments: history of recent fall Functional Status:  Mobility: Ambulation/Gait Gait Distance (Feet): 15 Feet Gait velocity: decreased General Gait Details: Pt requiring modA + 2 for stability, close chair follow utilized. Cues for closer BOS, walker proximity, stepping initiation. Noted bilateral knee instability   ADL: ADL Lower Body Dressing Details (indicate cue type and reason): Providing support to bring ankle to knee (Max A for holding figure four position), pt then donning socks with Min A to slip sock over right toes.    Cognition: Cognition Overall Cognitive Status: Impaired/Different from baseline Orientation Level: Oriented X4 Cognition Arousal/Alertness: Awake/alert Behavior During Therapy: WFL for tasks assessed/performed Overall Cognitive Status: Impaired/Different from baseline Area of Impairment: Orientation, Safety/judgement, Problem solving, Following commands, Awareness Orientation Level: Disoriented to, Time Following Commands: Follows one step commands inconsistently, Follows one step commands with increased time Safety/Judgement: Decreased awareness of safety, Decreased awareness of deficits Awareness: Intellectual Problem Solving: Slow processing, Requires verbal cues General Comments: Pt not oriented to year, able to correctly answer when given options. Decreased following of  cues and proccessing. Requiring increased time throughout. Increased fear of falls.    Blood pressure (!) 170/68, pulse (!) 59, temperature 98.3 F (36.8 C), temperature source Oral, resp. rate 16, height 5' 4"  (1.626 m), weight 90.3 kg, SpO2 98 %. Physical Exam  Nursing note and vitals reviewed. Constitutional: She is oriented to person, place, and time. She appears well-developed and well-nourished.  HENT:  Head: Normocephalic and atraumatic.  Eyes: Pupils are equal, round, and reactive to light. Conjunctivae and EOM are normal.  Cardiovascular: Normal rate, regular rhythm and normal heart sounds.  No murmur heard. Respiratory: Effort normal and breath sounds normal. No stridor. No respiratory distress. She has no wheezes.  GI: Soft. Bowel sounds are normal. She exhibits no distension.  Musculoskeletal:     Cervical back: Normal range of motion.     Comments: There is decreased ankle dorsiflexion bilaterally no tenderness over the Achilles tendon.  No pain with ankle inversion or eversion. There is no evidence of joint swelling noted.  No erythema.  No tenderness to palpation over the deltoid ligament, anterior talofibular ligament or Achilles tendon.  No tenderness over the joint line.  Neurological: She is alert and oriented to person, place, and time. She displays tremor.  Motor strength is 5/5 bilateral deltoid bicep tricep grip 3 - bilateral hip flexors 4 - bilateral knee extensors and 4 - bilateral ankle dorsiflexors.  There is a fine tremor noted during cerebellar testing she has no past pointing with finger-nose-finger exam bilaterally There is no evidence of nystagmus  Skin: Skin is warm and dry.  No evidence of psoriatic plaques on the lower extremities   Lab Results Last 24 Hours       Results for orders placed or performed during the hospital encounter of 04/21/19 (from the past 24 hour(s))  Basic metabolic panel     Status: Abnormal    Collection Time: 04/24/19  2:48 AM    Result Value Ref Range    Sodium 141 135 - 145 mmol/L    Potassium 3.1 (L) 3.5 - 5.1 mmol/L    Chloride 109 98 - 111 mmol/L    CO2 23 22 - 32 mmol/L    Glucose, Bld 89 70 - 99 mg/dL    BUN 11 8 - 23 mg/dL    Creatinine, Ser 1.82 (H) 0.44 - 1.00  mg/dL    Calcium 9.3 8.9 - 10.3 mg/dL    GFR calc non Af Amer 28 (L) >60 mL/min    GFR calc Af Amer 32 (L) >60 mL/min    Anion gap 9 5 - 15  CBC     Status: None    Collection Time: 04/24/19  2:48 AM  Result Value Ref Range    WBC 8.5 4.0 - 10.5 K/uL    RBC 4.11 3.87 - 5.11 MIL/uL    Hemoglobin 12.4 12.0 - 15.0 g/dL    HCT 39.4 36.0 - 46.0 %    MCV 95.9 80.0 - 100.0 fL    MCH 30.2 26.0 - 34.0 pg    MCHC 31.5 30.0 - 36.0 g/dL    RDW 14.2 11.5 - 15.5 %    Platelets 191 150 - 400 K/uL    nRBC 0.0 0.0 - 0.2 %      Imaging Results (Last 48 hours)  No results found.     Assessment/Plan: Diagnosis: Debility and encephalopathy in a patient with Parkinson's disease with recent Covid infection. 1. Does the need for close, 24 hr/day medical supervision in concert with the patient's rehab needs make it unreasonable for this patient to be served in a less intensive setting? Yes 2. Co-Morbidities requiring supervision/potential complications: Psoriatic arthritis, bipolar disorder, history of falls, CKD 3. Due to bladder management, bowel management, safety, skin/wound care, disease management, medication administration, pain management and patient education, does the patient require 24 hr/day rehab nursing? Yes 4. Does the patient require coordinated care of a physician, rehab nurse, PT (1-2 hrs/day, 5 days/week), OT (1-2 hrs/day, 5 days/week) and SLP (.5-1 hrs/day, 5 days/week) to address physical and functional deficits in the context of the above medical diagnosis(es)? Yes Addressing deficits in the following areas: balance, endurance, locomotion, strength, transferring, bowel/bladder control, bathing, dressing, feeding, grooming, toileting,  cognition, speech, language, swallowing and psychosocial support 5. Can the patient actively participate in an intensive therapy program of at least 3 hrs of therapy per day at least 5 days per week? Yes 6. The potential for patient to make measurable gains while on inpatient rehab is excellent 7. Anticipated functional outcomes upon discharge from inpatients are supervision mobility PT, supervision ADLs OT, supervision with med management SLP 8. Estimated rehab length of stay to reach the above functional goals is: 10 to 14 days 9. Does the patient have adequate social supports to accommodate these discharge functional goals? Yes 10. Anticipated D/C setting: Home 11. Anticipated post D/C treatments: [place] therapy 12. Overall Rehab/Functional Prognosis: good   RECOMMENDATIONS: This patient's condition is appropriate for continued rehabilitative care in the following setting: CIR Patient has agreed to participate in recommended program. Yes Note that insurance prior authorization may be required for reimbursement for recommended care.   Comment:     Tiffany [name] 04/24/2019          Routing History

## 2019-04-27 ENCOUNTER — Inpatient Hospital Stay (HOSPITAL_COMMUNITY): Payer: Medicare Other

## 2019-04-27 ENCOUNTER — Inpatient Hospital Stay (HOSPITAL_COMMUNITY): Payer: Medicare Other | Admitting: Physical Therapy

## 2019-04-27 ENCOUNTER — Encounter: Payer: Medicare Other | Admitting: Family Medicine

## 2019-04-27 ENCOUNTER — Inpatient Hospital Stay (HOSPITAL_COMMUNITY): Payer: Medicare Other | Admitting: Speech Pathology

## 2019-04-27 ENCOUNTER — Ambulatory Visit: Payer: Medicare Other | Admitting: Psychology

## 2019-04-27 DIAGNOSIS — G9340 Encephalopathy, unspecified: Secondary | ICD-10-CM

## 2019-04-27 DIAGNOSIS — F3170 Bipolar disorder, currently in remission, most recent episode unspecified: Secondary | ICD-10-CM

## 2019-04-27 DIAGNOSIS — G20 Parkinson's disease: Secondary | ICD-10-CM

## 2019-04-27 DIAGNOSIS — R748 Abnormal levels of other serum enzymes: Secondary | ICD-10-CM

## 2019-04-27 LAB — CBC WITH DIFFERENTIAL/PLATELET
Abs Immature Granulocytes: 0.02 10*3/uL (ref 0.00–0.07)
Basophils Absolute: 0 10*3/uL (ref 0.0–0.1)
Basophils Relative: 1 %
Eosinophils Absolute: 0.2 10*3/uL (ref 0.0–0.5)
Eosinophils Relative: 3 %
HCT: 41.4 % (ref 36.0–46.0)
Hemoglobin: 12.9 g/dL (ref 12.0–15.0)
Immature Granulocytes: 0 %
Lymphocytes Relative: 36 %
Lymphs Abs: 2.5 10*3/uL (ref 0.7–4.0)
MCH: 29.8 pg (ref 26.0–34.0)
MCHC: 31.2 g/dL (ref 30.0–36.0)
MCV: 95.6 fL (ref 80.0–100.0)
Monocytes Absolute: 0.5 10*3/uL (ref 0.1–1.0)
Monocytes Relative: 7 %
Neutro Abs: 3.8 10*3/uL (ref 1.7–7.7)
Neutrophils Relative %: 53 %
Platelets: 199 10*3/uL (ref 150–400)
RBC: 4.33 MIL/uL (ref 3.87–5.11)
RDW: 14.1 % (ref 11.5–15.5)
WBC: 7 10*3/uL (ref 4.0–10.5)
nRBC: 0 % (ref 0.0–0.2)

## 2019-04-27 LAB — COMPREHENSIVE METABOLIC PANEL
ALT: 78 U/L — ABNORMAL HIGH (ref 0–44)
AST: 82 U/L — ABNORMAL HIGH (ref 15–41)
Albumin: 3.1 g/dL — ABNORMAL LOW (ref 3.5–5.0)
Alkaline Phosphatase: 95 U/L (ref 38–126)
Anion gap: 9 (ref 5–15)
BUN: 10 mg/dL (ref 8–23)
CO2: 21 mmol/L — ABNORMAL LOW (ref 22–32)
Calcium: 9.1 mg/dL (ref 8.9–10.3)
Chloride: 112 mmol/L — ABNORMAL HIGH (ref 98–111)
Creatinine, Ser: 1.88 mg/dL — ABNORMAL HIGH (ref 0.44–1.00)
GFR calc Af Amer: 31 mL/min — ABNORMAL LOW (ref >60)
GFR calc non Af Amer: 27 mL/min — ABNORMAL LOW (ref >60)
Glucose, Bld: 95 mg/dL (ref 70–99)
Potassium: 3.6 mmol/L (ref 3.5–5.1)
Sodium: 142 mmol/L (ref 135–145)
Total Bilirubin: 0.5 mg/dL (ref 0.3–1.2)
Total Protein: 5.9 g/dL — ABNORMAL LOW (ref 6.5–8.1)

## 2019-04-27 NOTE — Evaluation (Signed)
Speech Language Pathology Assessment and Plan  Patient Details  Name: Tiffany [name] MRN: [id number] Date of Birth: 12/27/1950  SLP Diagnosis: Cognitive Impairments;Dysphagia  Rehab Potential: Excellent ELOS: 7-10 days    Today's Date: 04/27/2019 SLP Individual Time: 1100-1200 SLP Individual Time Calculation (min): 60 min   Problem List:  Patient Active Problem List   Diagnosis Date Noted  . Encephalopathy 04/26/2019  . Acute encephalopathy 04/21/2019  . Acute kidney injury superimposed on chronic kidney disease ([place]) 04/21/2019  . Leukocytosis 04/21/2019  . Fall at home, initial encounter 04/21/2019  . Acute metabolic encephalopathy 39/05/90  . Anxiety and depression 04/21/2019  . COVID-19 04/21/2019  . Thrombocytopenia ([place]) 09/04/2018  . Vitamin D deficiency 09/04/2018  . Psoriatic arthritis ([place]) 09/04/2018  . History of colonic polyps 05/28/2017  . 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 Malignant neoplasm of overlapping sites of left breast in female, estrogen receptor positive ([place]) 03/14/2016  . Chronic kidney disease, stage III (moderate) 01/19/2016  . Tardive dyskinesia 10/18/2015  . HX: breast cancer 12/27/2014  . Osteopenia determined by x-ray 10/31/2014  . Rosacea 05/21/2007  . Bipolar affective disorder, mixed ([place]) 08/16/2004  . Acquired hypothyroidism 04/03/1996   Past Medical History:  Past Medical History:  Diagnosis Date  . Bipolar 1 disorder ([place])   . Breast cancer ([place])   . Chronic diarrhea    loose stools twice a day on average for years  . Chronic kidney disease (CKD), stage III (moderate)   . Depression 1987  . Parkinson's disease ([place]) 2012  . Psoriatic arthritis ([place])   . PTSD (post-traumatic stress disorder)   . Secondary hyperparathyroidism ([place])   . Tardive dyskinesia    Past Surgical History:  Past Surgical History:  Procedure Laterality Date  . ABDOMINAL HYSTERECTOMY  1987  . CHOLECYSTECTOMY   1979  . DILATION AND CURETTAGE OF UTERUS  1973  . MASTECTOMY Bilateral 09/19/2014  . TONSILLECTOMY  1970  . 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 Family reported waxing and waning of confusion that's usually worse at nights and patient with fall with concerns of stroke therefore admitted for work up. Patient moved from [place] 08/2018 (Son with [employer]) and has history of intermittent lithium toxicity. 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 Dr. Leonel Ramsay consulted and expressed concern of seizures as well as lithium toxicity. Encephalopathy seen in setting of Covid and would likely take weeks/months to recover. Lithium level-0.87 and Lamictal- 15.3. TSH-0.095/T3-2.4/T4-1.43. Synthroid and Lamictal held as question contributing to tremors/encephalopathy. Noted to have leucocytosis at admission with WBC-17.8-->BC/UA negative and acute on chronic renal failure with SCr-2.67.  LT EEG showed left temporoparietal sharp waves in addition to mild to moderate diffuse encephalopathy. Low dose Keppra added in addition to resuming lower dose Lamictal. Acute on chronic renal failure with hypernatremia has resolved with D5. As T4 levels down to 1.4-->synthroid resumed at lower dose on 2/13. Psychiatry consulted for input on medications changes to help manage anxiety and depression. Lamictal decreased to 100 mg/HS and addition of low dose Sertraline. Neurology however recommended discontinuation of latter as mentation improving  and to minimize medications. To use lorazepam prn for  anxiety/panic attacks. Tremors have improved and patient to follow up with Dr. Carles Collet after discharge. Therapy evaluations completed revealing balance deficits with significant weakness, cognitive deficits with delayed processing and poor awareness of deficits. CIR recommended due to recent functional decline. Pt is to admit to CIR on 04/26/19.   Patient with baseline memory deficits (per report) and demonstrates mild-moderate cognitive impairments impacting functional problem solving, reasoning, recall and overall safety with functional and familiar tasks. Patient was 100% intelligible and did not demonstrate any deficits in word-finding or auditory comprehension throughout the evaluation. Patient with baseline oral dyskinesia (6-7 years per report) which results in patient utilizing prolonged and deliberate mastication of solid textures with mild oral residue that clears with a liquid wash. Patient reports intermittent coughing with meals, however, coughing was not observed with trials of thin liquids or regular textures. Recommend patient remain on Dys. 3 textures with thin liquids to maximize efficiency and overall safety with PO intake. Patient would benefit from skilled SLP intervention to maximize her cognitive and swallowing function prior to discharge.    Skilled Therapeutic Interventions          Administered a cognitive-linguistic evaluation and BSE, please see above for details.   SLP Assessment  Patient will need skilled Speech Lanaguage Pathology Services during CIR admission    Recommendations  SLP Diet Recommendations: Dysphagia 3 (Mech soft);Thin Liquid Administration via: Cup;No straw Medication Administration: Whole meds with puree Supervision: Patient able to self feed;Intermittent supervision to cue for compensatory strategies Compensations: Minimize environmental distractions;Slow rate;Small sips/bites Postural Changes and/or Swallow Maneuvers: Seated upright 90 degrees;Out of bed for  meals Oral Care Recommendations: Oral care BID Recommendations for Other Services: Neuropsych consult Patient destination: Home Follow up Recommendations: 24 hour supervision/assistance(TBD) Equipment Recommended: None recommended by SLP    SLP Frequency 3 to 5 out of 7 days   SLP Duration  SLP Intensity  SLP Treatment/Interventions 7-10 days  Minumum of 1-2 x/day, 30 to 90 minutes  Cognitive remediation/compensation;Dysphagia/aspiration precaution training;Internal/external aids;Cueing hierarchy;Environmental controls;Therapeutic Activities;Patient/family education;Functional tasks    Pain No/Denies Pain  SLP Evaluation Cognition Overall Cognitive Status: History of cognitive impairments - at baseline Arousal/Alertness: Awake/alert Orientation Level: Oriented to person;Oriented to place;Oriented to situation;Oriented to time Attention: Sustained Sustained Attention: Appears intact Memory: Impaired Memory Impairment: Decreased recall of new information;Decreased short term memory Decreased Short Term Memory: Verbal basic;Functional basic Awareness: Appears intact Problem Solving: Impaired Problem Solving Impairment: Verbal basic;Functional basic Safety/Judgment: Impaired  Comprehension Auditory Comprehension Overall Auditory Comprehension: Appears within functional limits for tasks assessed Visual Recognition/Discrimination Discrimination: Not tested Reading Comprehension Reading Status: Not tested Expression Expression Primary Mode of Expression: Verbal Verbal Expression Overall Verbal Expression: Appears within functional limits for tasks assessed Written Expression Written Expression: Not tested Oral Motor Oral Motor/Sensory Function Overall Oral Motor/Sensory Function: Other (comment)(oral dyskinesia) Motor Speech Overall Motor Speech: (oral dyskinesia)  Bedside Swallowing Assessment General Date of Onset: 04/21/19 Previous Swallow Assessment: BSE on  2/10: Recommended Dys. 3 textures with thin liquids via cup Diet Prior to this Study: Dysphagia 3 (soft);Thin liquids Temperature Spikes Noted: No Respiratory Status: Room air History of Recent Intubation: No Behavior/Cognition: Alert;Cooperative;Pleasant mood Oral Cavity - Dentition: Adequate natural dentition Self-Feeding Abilities: Able to feed self Vision: Functional for self-feeding Patient Positioning: Upright in chair/Tumbleform Baseline Vocal Quality: Normal Volitional Cough: Strong Volitional Swallow: Able to elicit  Ice Chips Ice chips: Within functional limits Presentation: Spoon;Self Fed Thin Liquid Thin Liquid: Within  functional limits Presentation: Cup Nectar Thick Nectar Thick Liquid: Not tested Honey Thick Honey Thick Liquid: Not tested Puree Puree: Within functional limits Presentation: Self Fed;Spoon Solid Solid: Impaired Presentation: Self Fed;Spoon Oral Phase Impairments: Impaired mastication Oral Phase Functional Implications: Prolonged oral transit;Impaired mastication;Oral residue Other Comments: Patient reports prolonged mastication at baseline, husband agrees BSE Assessment Risk for Aspiration Impact on safety and function: Mild aspiration risk Other Related Risk Factors: Deconditioning  Short Term Goals: Week 1: SLP Short Term Goal 1 (Week 1): STGs=LTGs due to ELOS  Refer to Care Plan for Long Term Goals  Recommendations for other services: Neuropsych  Discharge Criteria: Patient will be discharged from SLP if patient refuses treatment 3 consecutive times without medical reason, if treatment goals not met, if there is a change in medical status, if patient makes no progress towards goals or if patient is discharged from hospital.  The above assessment, treatment plan, treatment alternatives and goals were discussed and mutually agreed upon: by patient and by family  Jceon Alverio 04/27/2019, 12:41 PM

## 2019-04-27 NOTE — Plan of Care (Signed)
  Problem: Consults Goal: RH GENERAL PATIENT EDUCATION Description: See Patient Education module for education specifics. Outcome: Progressing Goal: Skin Care Protocol Initiated - if Braden Score 18 or less Description: If consults are not indicated, leave blank or document N/A Outcome: Progressing   Problem: RH BOWEL ELIMINATION Goal: RH STG MANAGE BOWEL WITH ASSISTANCE Description: STG Manage Bowel with min Assistance. Outcome: Progressing   Problem: RH BLADDER ELIMINATION Goal: RH STG MANAGE BLADDER WITH ASSISTANCE Description: STG Manage Bladder With min Assistance Outcome: Progressing   Problem: RH SKIN INTEGRITY Goal: RH STG MAINTAIN SKIN INTEGRITY WITH ASSISTANCE Description: STG Maintain Skin Integrity With min Assistance. Outcome: Progressing Goal: RH STG ABLE TO PERFORM INCISION/WOUND CARE W/ASSISTANCE Description: STG Able To Perform Incision/Wound Care With min Assistance. Outcome: Progressing   Problem: RH SAFETY Goal: RH STG ADHERE TO SAFETY PRECAUTIONS W/ASSISTANCE/DEVICE Description: STG Adhere to Safety Precautions With min Assistance and appropriate assistive Device. Outcome: Progressing   Problem: RH PAIN MANAGEMENT Goal: RH STG PAIN MANAGED AT OR BELOW PT'S PAIN GOAL Description: <3 on a 0-10 pain scale Outcome: Progressing   Problem: RH KNOWLEDGE DEFICIT GENERAL Goal: RH STG INCREASE KNOWLEDGE OF SELF CARE AFTER HOSPITALIZATION Description: Patient and spouse with demonstrate knowledge of medication management, dietary management, and follow up care with the MD post discharge with min assist from staff. Outcome: Progressing

## 2019-04-27 NOTE — Evaluation (Signed)
Occupational Therapy Assessment and Plan  Patient Details  Name: Tiffany [name] MRN: [id number] Date of Birth: 1950-08-05  OT Diagnosis: cognitive deficits and muscle weakness (generalized) Rehab Potential: Rehab Potential (ACUTE ONLY): Good ELOS: 7-10 days   Today's Date: 04/27/2019 OT Individual Time: 1300-1410 OT Individual Time Calculation (min): 70 min     Problem List:  Patient Active Problem List   Diagnosis Date Noted  . Encephalopathy 04/26/2019  . Acute encephalopathy 04/21/2019  . Acute kidney injury superimposed on chronic kidney disease ([place]) 04/21/2019  . Leukocytosis 04/21/2019  . Fall at home, initial encounter 04/21/2019  . Acute metabolic encephalopathy 77/41/2878  . Anxiety and depression 04/21/2019  . COVID-19 04/21/2019  . Thrombocytopenia ([place]) 09/04/2018  . Vitamin D deficiency 09/04/2018  . Psoriatic arthritis ([place]) 09/04/2018  . History of colonic polyps 05/28/2017  . 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 Malignant neoplasm of overlapping sites of left breast in female, estrogen receptor positive ([place]) 03/14/2016  . Chronic kidney disease, stage III (moderate) 01/19/2016  . Tardive dyskinesia 10/18/2015  . HX: breast cancer 12/27/2014  . Osteopenia determined by x-ray 10/31/2014  . Rosacea 05/21/2007  . Bipolar affective disorder, mixed ([place]) 08/16/2004  . Acquired hypothyroidism 04/03/1996    Past Medical History:  Past Medical History:  Diagnosis Date  . Bipolar 1 disorder ([place])   . Breast cancer ([place])   . Chronic diarrhea    loose stools twice a day on average for years  . Chronic kidney disease (CKD), stage III (moderate)   . Depression 1987  . Parkinson's disease ([place]) 2012  . Psoriatic arthritis ([place])   . PTSD (post-traumatic stress disorder)   . Secondary hyperparathyroidism ([place])   . Tardive dyskinesia    Past Surgical History:  Past Surgical History:  Procedure Laterality Date  . ABDOMINAL  HYSTERECTOMY  1987  . CHOLECYSTECTOMY  1979  . DILATION AND CURETTAGE OF UTERUS  1973  . MASTECTOMY Bilateral 09/19/2014  . TONSILLECTOMY  1970  . 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  Family reported waxing and waning of confusion that's usually worse at nights and patient with fall with concerns of stroke therefore admitted for work up.  Patient moved from [place] 08/2018 (Son with [employer]) and has history of intermittent lithium toxicity.  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 Dr. Leonel Ramsay consulted and expressed concern of seizures as well as lithium toxicity. Encephalopathy seen in setting of Covid and would likely take weeks/months to recover. Lithium level-0.87 and Lamictal- 15.3. TSH-0.095/T3-2.4/T4-1.43. Synthroid and Lamictal held as question contributing to tremors/encephalopathy. Noted to have leucocytosis at admission with WBC-17.8--> BC/UA negative and acute on chronic renal failure with SCr-2.67.   LT EEG showed left temporoparietal sharp waves in addition to mild to moderate diffuse encephalopathy. Low dose Keppra added in addition to resuming lower dose Lamictal. Acute on chronic renal failure with hypernatremia has resolved with D5. As T4 levels down to 1.4-->synthroid resumed at lower dose on 2/13. Psychiatry consulted for input on medications changes to help manage anxiety and depression. Lamictal decreased to 100 mg/HS and addition of low  dose Sertraline. Neurology however recommended discontinuation of latter as mentation improving and to minimize  medications. To use lorazepam prn for anxiety/panic attacks. .  Tremors have improved and patient to follow up with Dr. Carles Collet after discharge. Therapy evaluations completed revealing balance deficits with significant weakness, cognitive deficits with delayed processing and poor awareness of deficits. CIR recommended due to recent functional decline.  Patient transferred to CIR on 04/26/2019 .    Patient currently requires min with basic self-care skills secondary to muscle weakness, decreased cardiorespiratoy endurance, decreased awareness, decreased problem solving, decreased safety awareness, decreased memory and delayed processing and decreased sitting balance, decreased standing balance and decreased balance strategies.  Prior to hospitalization, patient could complete ADLs with supervision.  Patient will benefit from skilled intervention to decrease level of assist with basic self-care skills prior to discharge home with care partner.  Anticipate patient will require 24 hour supervision and follow up outpatient.  OT - End of Session Activity Tolerance: Tolerates 10 - 20 min activity with multiple rests Endurance Deficit: Yes Endurance Deficit Description: decreased OT Assessment Rehab Potential (ACUTE ONLY): Good OT Patient demonstrates impairments in the following area(s): Balance;Safety;Cognition;Endurance;Motor OT Basic ADL's Functional Problem(s): Bathing;Dressing;Toileting OT Transfers Functional Problem(s): Toilet;Tub/Shower OT Additional Impairment(s): None OT Plan OT Intensity: Minimum of 1-2 x/day, 45 to 90 minutes OT Frequency: 5 out of 7 days OT Duration/Estimated Length of Stay: 7-10 days OT Treatment/Interventions: Balance/vestibular training;Discharge planning;Self Care/advanced ADL retraining;Therapeutic Activities;UE/LE Coordination activities;Cognitive remediation/compensation;Functional mobility training;Patient/family education;Skin care/wound managment;Therapeutic  Exercise;Community reintegration;DME/adaptive equipment instruction;Psychosocial support;UE/LE Strength taining/ROM OT Self Feeding Anticipated Outcome(s): no goal set OT Basic Self-Care Anticipated Outcome(s): (S) OT Toileting Anticipated Outcome(s): (S) OT Bathroom Transfers Anticipated Outcome(s): (S) OT Recommendation Recommendations for Other Services: Neuropsych consult Patient destination: Home Follow Up Recommendations: Outpatient OT Equipment Recommended: To be determined   Skilled Therapeutic Intervention Skilled OT evaluation completed. Pt and her husband (via phone) were edu re OT POC, ELOS, goals, and follow up recommendations. Pt completed ADLs as described below- at shower level. Pt completed transfers at overall min A level without AD. Pt with cognitive deficits, with impairments in processing and memory. Pt was left sitting up in the w/c with all needs met.   OT Evaluation Precautions/Restrictions  Precautions Precautions: Fall Restrictions Weight Bearing Restrictions: No General Chart Reviewed: Yes Family/Caregiver Present: No Vital Signs Therapy Vitals Temp: (!) 97.5 F (36.4 C) Pulse Rate: 63 Resp: 18 BP: 128/77 Patient Position (if appropriate): Sitting Oxygen Therapy SpO2: 93 % O2 Device: Room Air Pain Pain Assessment Pain Scale: 0-10 Pain Score: 0-No pain Home Living/Prior Functioning Home Living Family/patient expects to be discharged to:: Private residence Living Arrangements: Spouse/significant other Available Help at Discharge: Family, Available 24 hours/day Type of Home: House Home Access: Stairs to enter Technical brewer of Steps: 3 Entrance Stairs-Rails: Can reach both Home Layout: One level Bathroom Shower/Tub: Multimedia programmer: Associate Professor Accessibility: Yes  Lives With: Spouse, Daughter IADL History Homemaking Responsibilities: No Current License: No Occupation: Retired Prior Function Level of  Independence: Needs assistance with ADLs, Needs assistance with homemaking, Needs assistance with gait  Able to Take Stairs?: Yes Driving: Yes Vocation: Retired Comments: Pt w/ multiple falls recently, she attributes this to "instability and weakness". Family needed EMS help to get pt off floor. ADL ADL Eating: Set up Where Assessed-Eating: Chair Grooming: Supervision/safety Where Assessed-Grooming: Sitting at sink Upper Body Bathing: Supervision/safety Where Assessed-Upper Body Bathing: Shower Lower Body Bathing: Minimal assistance Where Assessed-Lower Body Bathing: Shower Upper Body Dressing:  Minimal assistance Where Assessed-Upper Body Dressing: Wheelchair Lower Body Dressing: Moderate assistance Where Assessed-Lower Body Dressing: Wheelchair Toileting: Minimal assistance Where Assessed-Toileting: Glass blower/designer: Psychiatric nurse Method: Counselling psychologist: Energy manager: Environmental education officer Method: Heritage manager: Civil engineer, contracting with back Vision Baseline Vision/History: No visual deficits Patient Visual Report: Blurring of vision Vision Assessment?: No apparent visual deficits Perception  Perception: Within Functional Limits Praxis Praxis: Intact Cognition Overall Cognitive Status: History of cognitive impairments - at baseline Arousal/Alertness: Awake/alert Orientation Level: Person;Place;Situation Person: Oriented Place: Oriented Situation: Oriented Year: Other (Comment)(2001) Month: February Day of Week: Correct Memory: Impaired Memory Impairment: Decreased recall of new information;Decreased short term memory Decreased Short Term Memory: Verbal basic;Functional basic Immediate Memory Recall: Sock;Blue;Bed Memory Recall Sock: Not able to recall Memory Recall Blue: Not able to recall Memory Recall Bed: Not able to recall Attention: Sustained Sustained  Attention: Appears intact Awareness: Appears intact Problem Solving: Impaired Problem Solving Impairment: Verbal basic;Functional basic Safety/Judgment: Impaired Sensation Sensation Light Touch: Appears Intact Hot/Cold: Appears Intact Coordination Gross Motor Movements are Fluid and Coordinated: Yes Fine Motor Movements are Fluid and Coordinated: No Finger Nose Finger Test: Limited by tremors Motor  Motor Motor: Other (comment) Motor - Skilled Clinical Observations: Generalized weakness, pt reports tremors in B hands prior to hospitalization. Reports she was unable to hold utensils to eat. Now pt's tremors are significantly improved, she is unsure if it's due to medication changes or not. WIll continue to assess. No other motor impairments observed. Mobility  Bed Mobility Bed Mobility: Rolling Right;Rolling Left;Supine to Sit;Sit to Supine Rolling Right: Supervision/verbal cueing Rolling Left: Supervision/Verbal cueing Supine to Sit: Supervision/Verbal cueing Sit to Supine: Supervision/Verbal cueing Transfers Sit to Stand: Minimal Assistance - Patient > 75% Stand to Sit: Minimal Assistance - Patient > 75%  Trunk/Postural Assessment  Cervical Assessment Cervical Assessment: Within Functional Limits Thoracic Assessment Thoracic Assessment: Within Functional Limits Lumbar Assessment Lumbar Assessment: Exceptions to WFL(posterior pelvic tilt) Postural Control Postural Control: Deficits on evaluation Righting Reactions: delayed  Balance Balance Balance Assessed: Yes Static Sitting Balance Static Sitting - Level of Assistance: 5: Stand by assistance Dynamic Sitting Balance Dynamic Sitting - Level of Assistance: 5: Stand by assistance Static Standing Balance Static Standing - Level of Assistance: 4: Min assist Dynamic Standing Balance Dynamic Standing - Level of Assistance: 4: Min assist Extremity/Trunk Assessment RUE Assessment RUE Assessment: Exceptions to [facility] General  Strength Comments: generalized weakness LUE Assessment LUE Assessment: Exceptions to [facility] General Strength Comments: generalized weakness     Refer to Care Plan for Long Term Goals  Recommendations for other services: Neuropsych   Discharge Criteria: Patient will be discharged from OT if patient refuses treatment 3 consecutive times without medical reason, if treatment goals not met, if there is a change in medical status, if patient makes no progress towards goals or if patient is discharged from hospital.  The above assessment, treatment plan, treatment alternatives and goals were discussed and mutually agreed upon: by patient and by family  Curtis Sites 04/27/2019, 3:13 PM

## 2019-04-27 NOTE — Progress Notes (Signed)
Received call from GI panel that was ordered on 2/14 and still showing as active. I informed him that it was not reordered here and not needed as patient with history of chronic diarrhea for 20+ years. Usually has loose to liquid stools twice a day on average which can occasionally worsen to three times a day with incontinence. This was confirmed by husband and GI symptoms currently at baseline so contact precautions not needed.

## 2019-04-27 NOTE — Progress Notes (Signed)
Inpatient Rehabilitation  Patient information reviewed and entered into eRehab system by Lyssa Hackley M. Lorie Cleckley, M.A., CCC/SLP, PPS Coordinator.  Information including medical coding, functional ability and quality indicators will be reviewed and updated through discharge.    

## 2019-04-27 NOTE — Progress Notes (Addendum)
Tiffany [name] PHYSICAL MEDICINE & REHABILITATION PROGRESS NOTE   Subjective/Complaints: Had a good session with PT this morning. Feels that she's really making progress. Happy to be on rehab  ROS: Patient denies fever, rash, sore throat, blurred vision, nausea, vomiting, diarrhea, cough, shortness of breath or chest pain, joint or back pain, headache, or mood change.   Objective:   No results found. Recent Labs    04/27/19 0544  WBC 7.0  HGB 12.9  HCT 41.4  PLT 199   Recent Labs    04/26/19 0737 04/27/19 0544  NA 144 142  K 3.5 3.6  CL 109 112*  CO2 23 21*  GLUCOSE 115* 95  BUN 9 10  CREATININE 1.77* 1.88*  CALCIUM 9.1 9.1    Intake/Output Summary (Last 24 hours) at 04/27/2019 1107 Last data filed at 04/27/2019 0758 Gross per 24 hour  Intake 462 ml  Output --  Net 462 ml     Physical Exam: Vital Signs Blood pressure 126/69, pulse 66, temperature (!) 97.5 F (36.4 C), temperature source Oral, resp. rate 19, height 5\' 4"  (1.626 m), weight 92.5 kg, SpO2 97 %. Constitutional: No distress . Vital signs reviewed.  HEENT: EOMI, oral membranes moist Neck: supple Cardiovascular: RRR without murmur. No JVD    Respiratory: CTA Bilaterally without wheezes or rales. Normal effort    GI: BS +, non-tender, non-distended  Musculoskeletal:  Cervical back: Normal range of motion.  No ankle tenderness Neurological: She is alertand oriented to person, place, and time. She displays resting/intentional tremor. Masked facies. No rigidity Motor strength is 5/5 bilateral deltoid bicep tricep grip 3 - bilateral hip flexors 4 - bilateral knee extensors and 4 - bilateral ankle dorsiflexors. No ataxia.  Skin: Skin iswarmand dry. Abrasions on both knees. Psych: very pleasant, cooperative    Assessment/Plan: 1. Functional deficits secondary to debility, gait disorder which require 3+ hours per day of interdisciplinary therapy in a comprehensive inpatient rehab  setting.  Physiatrist is providing close team supervision and 24 hour management of active medical problems listed below.  Physiatrist and rehab team continue to assess barriers to discharge/monitor patient progress toward functional and medical goals  Care Tool:  Bathing              Bathing assist       Upper Body Dressing/Undressing Upper body dressing   What is the patient wearing?: Hospital gown only    Upper body assist      Lower Body Dressing/Undressing Lower body dressing      What is the patient wearing?: Underwear/pull up     Lower body assist Assist for lower body dressing: Independent     Toileting Toileting    Toileting assist Assist for toileting: Contact Guard/Touching assist     Transfers Chair/bed transfer  Transfers assist     Chair/bed transfer assist level: Minimal Assistance - Patient > 75%     Locomotion Ambulation   Ambulation assist      Assist level: Minimal Assistance - Patient > 75% Assistive device: No Device Max distance: 75'   Walk 10 feet activity   Assist     Assist level: Minimal Assistance - Patient > 75% Assistive device: No Device   Walk 50 feet activity   Assist    Assist level: Minimal Assistance - Patient > 75% Assistive device: No Device    Walk 150 feet activity   Assist Walk 150 feet activity did not occur: Safety/medical concerns  Walk 10 feet on uneven surface  activity   Assist Walk 10 feet on uneven surfaces activity did not occur: Safety/medical concerns         Wheelchair     Assist Will patient use wheelchair at discharge?: No   Wheelchair activity did not occur: N/A         Wheelchair 50 feet with 2 turns activity    Assist    Wheelchair 50 feet with 2 turns activity did not occur: N/A       Wheelchair 150 feet activity     Assist  Wheelchair 150 feet activity did not occur: N/A       Blood pressure 126/69, pulse 66, temperature  (!) 97.5 F (36.4 C), temperature source Oral, resp. rate 19, height 5\' 4"  (1.626 m), weight 92.5 kg, SpO2 97 %.  Medical Problem List and Plan: 1.  Impaired mobility and ADLs secondary to Debility and encephalopathy in a patient with Parkinson's disease with recent Covid infection.             -patient may shower             -ELOS/Goals: supervision  -beginning therapies today. ELOS 7-11 days 2.  Antithrombotics: -DVT/anticoagulation:  Pharmaceutical: Heparin             -antiplatelet therapy: will dc ASA per pt request. See no obvious need at present 3. Pain Management: Lidocaine patch for LBP/ tylenol prn.              -well controlled 4. Mood: Team to provide ego support. LCSW to follow for evaluation and support.           -would benefit from neuropsych follow-up. See#9. Also has anxiety and depression.              -antipsychotic agents: Lamictal 5. Neuropsych: This patient is not capable of making decisions on her own behalf. 6. Skin/Wound Care: Routine pressure relief measures.  7. Fluids/Electrolytes/Nutrition: encourage PO  -I personally reviewed the patient's labs today.    8. New onset seizures: On Keppra bid 9. Bipolar disorder: Last admission- Stable currently on Lamictal 100 mg/hs with Klonopin at night and prn lorazepam.  10. Parkinsonism/Tardrive dyskinesias: Tremors reported to be improving.  -mild intentional tremors currently  11. Hypothyroidism: Now on low dose synthyroid. Repeat TSH in 4 weeks. 12. Acute on chronic renal failure: On NaHCo3 bid with calcitriol for secondary hyperparathyroidism. Hypernatremia has resolved with SCr --1.77 at baseline  -1.88 Cr 2/16.  13. Leucocytosis/Thrombocytopenia:  199 on 2/16  14. HTN: Amlodipine 2.5mg  daily. Flowsheet personally reviewed. BP remains uncontrolled.    -Increased Amlodipine to 5mg  daily.  2/16 bp's under better control 15. Chronic diarrhea: Patient has had for 30 years. Currently on Imodium.  16. Increased LFT's:    2/16 slight bump from labs last week.   -lamictal can be associated with liver damage  -recheck LFT's Friday to see if there is further increase    LOS: 1 days A FACE TO [place] 04/27/2019, 11:07 AM

## 2019-04-27 NOTE — Patient Care Conference (Signed)
Inpatient RehabilitationTeam Conference and Plan of Care Update Date: 04/27/2019   Time: 10:10 AM    Patient Name: Tiffany [name]      Medical Record Number: [id number]  Date of Birth: 1950-08-04 Sex: Female         Room/Bed: 4W25C/4W25C-01 Payor Info: Payor: MEDICARE / Plan: MEDICARE PART A AND B / Product Type: *No Product type* /    Admit Date/Time:  04/26/2019  2:42 PM  Primary Diagnosis:  Encephalopathy  Patient Active Problem List   Diagnosis Date Noted  . Encephalopathy 04/26/2019  . Acute encephalopathy 04/21/2019  . Acute kidney injury superimposed on chronic kidney disease ([place]) 04/21/2019  . Leukocytosis 04/21/2019  . Fall at home, initial encounter 04/21/2019  . Acute metabolic encephalopathy 21/30/8657  . Anxiety and depression 04/21/2019  . COVID-19 04/21/2019  . Thrombocytopenia ([place]) 09/04/2018  . Vitamin D deficiency 09/04/2018  . Psoriatic arthritis ([place]) 09/04/2018  . History of colonic polyps 05/28/2017  . 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 Malignant neoplasm of overlapping sites of left breast in female, estrogen receptor positive ([place]) 03/14/2016  . Chronic kidney disease, stage III (moderate) 01/19/2016  . Tardive dyskinesia 10/18/2015  . HX: breast cancer 12/27/2014  . Osteopenia determined by x-ray 10/31/2014  . Rosacea 05/21/2007  . Bipolar affective disorder, mixed ([place]) 08/16/2004  . Acquired hypothyroidism 04/03/1996    Expected Discharge Date: Expected Discharge Date: (Estimated LOS 7-10 days.)  Team Members Present: Physician leading conference: Dr. Alger Simons Social Worker Present: Lennart Pall, LCSW(Auria Barbra Sarks, LCSW) Nurse Present: Other (comment)(Susan Truman Hayward, RN) Case Manager: Karene Fry, RN PT Present: Burnard Bunting, PT OT Present: Laverle Hobby, OT SLP Present: Weston Anna, SLP PPS Coordinator present : Gunnar Fusi, SLP     Current Status/Progress Goal Weekly Team Focus   Bowel/Bladder   per report, pt incontinent at times, however pt has been continent since admission onto floor, LBM 2/15  remain continent  assess toileting q shift and prn   Swallow/Nutrition/ Hydration   Eval Pending         ADL's   Eval pending        Mobility   min assist w/o AD, gait 75', 4 steps w/ B rails  supervision overall  endurance, gait, balance, NMR, discharge planning and edu   Communication             Safety/Cognition/ Behavioral Observations  Eval Pending         Pain   no c/o pain  remain pain free  assess pain q shift and prn   Skin   abrasions on right knee and bilateral toes  prevent further skin breakdown  assess toileting q shift and prn    Rehab Goals Patient on target to meet rehab goals: Yes *See Care Plan and progress notes for long and short-term goals.     Barriers to Discharge  Current Status/Progress Possible Resolutions Date Resolved   Nursing  Inaccessible home environment;Incontinence;Home environment access/layout;Medication compliance               PT  Inaccessible home environment;Medical stability                 OT                  SLP                SW  Discharge Planning/Teaching Needs:  Per [facility], pt to return home with spouse and adult daughter providing 24/7 assistance.  Teaching needs still TBD   Team Discussion: h/o parkinsons dz, breast CA, bipolar, had Covid, old CVA, on keppra for seizure prophylaxis, tremors, son is a hospitalist, BP good, no fever.  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  OT eval pending.  PT had BM today, min/CGA overall, amb 75', S goals.  SLP eval pending.   Revisions to Treatment Plan: N/A     Medical Summary Current Status: PD patient with PTSD, hx of bipolar disorder, debility after COVID, seizure disorder, multiple medical Weekly Focus/Goal: improve intake, pain control, bp mgt, tremor control  Barriers to Discharge: Medical stability        Continued Need for Acute Rehabilitation Level of Care: The patient requires daily medical management by a physician with specialized training in physical medicine and rehabilitation for the following reasons: Direction of a multidisciplinary physical rehabilitation program to maximize functional independence : Yes Medical management of patient stability for increased activity during participation in an intensive rehabilitation regime.: Yes Analysis of laboratory values and/or radiology reports with any subsequent need for medication adjustment and/or medical intervention. : Yes   I attest that I was present, lead the team conference, and concur with the assessment and plan of the team.   Jodell Cipro M 04/27/2019, 3:07 PM   Team conference was held via web/ teleconference due to [place] - 19

## 2019-04-27 NOTE — Progress Notes (Addendum)
Notified provider patient refused asa d/t CKD. Patient stated she does not take asa at home. Provider acknowledged.

## 2019-04-28 ENCOUNTER — Inpatient Hospital Stay (HOSPITAL_COMMUNITY): Payer: Medicare Other | Admitting: Speech Pathology

## 2019-04-28 ENCOUNTER — Inpatient Hospital Stay (HOSPITAL_COMMUNITY): Payer: Medicare Other

## 2019-04-28 ENCOUNTER — Inpatient Hospital Stay (HOSPITAL_COMMUNITY): Payer: Medicare Other | Admitting: Physical Therapy

## 2019-04-28 NOTE — Progress Notes (Signed)
Physical Therapy Session Note  Patient Details  Name: Tiffany [name] MRN: [id number] Date of Birth: 1951-02-24  Today's Date: 04/28/2019 PT Individual Time: 1300-1410 PT Individual Time Calculation (min): 70 min   Short Term Goals: Week 1:  PT Short Term Goal 1 (Week 1): =LTGs due to ELOS  Skilled Therapeutic Interventions/Progress Updates:   Pt in supine and agreeable to therapy, denies pain. Reports having increased fatigue but agreeable to therapy w/ encouragement. Supervision bed mobility and CGA stand pivot to w/c. Total assist w/c transport to/from therapy gym. Worked on standing tolerance and balance while standing to perform cognitive tasks. Tasks included simple pipe tree, needed max cues for sequencing, error correction, and for problem solving. Switched to simple and then complex peg board task w/ better success. Performed complex peg board w/ min fading to mod cues w/ fatigue for sequencing and error correction. Pt stood w/ CGA fading to close supervision in multiple 3-4 min bouts. Verbal encouragement to rely on BLEs for balance and not UE support. Blocked practice of sit<>stands on firm surface w/ verbal, visual, and tactile cues for anterior weight shifting at trunk and to control descent, CGA overall. Added in challenge of sit<>stands on airex pad. Needed mod assist to stabilize in stance at first w/ verbal and manual cues for ankle and hip balance strategies, faded w/ min assist w/ 5+ reps. Performed Berg Balance Scale and score 33/56, explained significance of results to pt including high fall risk. Also discussed that her fall risk is likely further increased by her fear of falling. Pt often would lose her balance and immediately sit back in her w/c during standing activity vs utilizing appropriate balance strategies to maintain standing. Worked on ambulation back to room, ambulated 100' x2 w/ seated rest break, CGA-min assist w/o AD and verbal cues to increase upright posture and for  increased foot clearance bilaterally. Pt ended session in supine, all needs in reach.   Therapy Documentation Precautions:  Precautions Precautions: Fall Restrictions Weight Bearing Restrictions: No  Therapy/Group: Individual Therapy  Edell Mesenbrink Clent Demark 04/28/2019, 2:21 PM

## 2019-04-28 NOTE — Care Management (Signed)
Inpatient Rehabilitation Center Individual Statement of Services  Patient Name:  Keyuna Cuthrell  Date:  04/28/2019  Welcome to the [facility].  Our goal is to provide you with an individualized program based on your diagnosis and situation, designed to meet your specific needs.  With this comprehensive rehabilitation program, you will be expected to participate in at least 3 hours of rehabilitation therapies Monday-Friday, with modified therapy programming on the weekends.  Your rehabilitation program will include the following services:  Physical Therapy (PT), Occupational Therapy (OT), Speech Therapy (ST), 24 hour per day rehabilitation nursing, Therapeutic Recreaction (TR), Neuropsychology, Case Management (Social Worker), Rehabilitation Medicine, Nutrition Services and Pharmacy Services  Weekly team conferences will be held on Tuesdays to discuss your progress.  Your Social Worker will talk with you frequently to get your input and to update you on team discussions.  Team conferences with you and your family in attendance may also be held.  Expected length of stay: 7-10 days   Overall anticipated outcome: supervision  Depending on your progress and recovery, your program may change. Your Social Worker will coordinate services and will keep you informed of any changes. Your Social Worker's name and contact numbers are listed  below.  The following services may also be recommended but are not provided by the [facility] will be made to provide these services after discharge if needed.  Arrangements include referral to agencies that provide these services.  Your insurance has been verified to be: Medicare; [place] primary doctor is:  Jonni Sanger  Pertinent information will be shared with your doctor and your insurance company.  Social Worker:   [place], [place] or ([phone]   Information discussed with and copy given to patient by: Lennart Pall, 04/28/2019, 3:59 PM

## 2019-04-28 NOTE — Progress Notes (Signed)
Social Work Assessment and Plan   Patient Details  Name: Tiffany [name] MRN: [id number] Date of Birth: 09/07/1950  Today's Date: 04/28/2019  Problem List:  Patient Active Problem List   Diagnosis Date Noted  . Encephalopathy 04/26/2019  . Acute encephalopathy 04/21/2019  . Acute kidney injury superimposed on chronic kidney disease ([place]) 04/21/2019  . Leukocytosis 04/21/2019  . Fall at home, initial encounter 04/21/2019  . Acute metabolic encephalopathy 02/77/4128  . Anxiety and depression 04/21/2019  . COVID-19 04/21/2019  . Thrombocytopenia ([place]) 09/04/2018  . Vitamin D deficiency 09/04/2018  . Psoriatic arthritis ([place]) 09/04/2018  . History of colonic polyps 05/28/2017  . 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 Malignant neoplasm of overlapping sites of left breast in female, estrogen receptor positive ([place]) 03/14/2016  . Chronic kidney disease, stage III (moderate) 01/19/2016  . Tardive dyskinesia 10/18/2015  . HX: breast cancer 12/27/2014  . Osteopenia determined by x-ray 10/31/2014  . Rosacea 05/21/2007  . Bipolar affective disorder, mixed ([place]) 08/16/2004  . Acquired hypothyroidism 04/03/1996   Past Medical History:  Past Medical History:  Diagnosis Date  . Bipolar 1 disorder ([place])   . Breast cancer ([place])   . Chronic diarrhea    loose stools twice a day on average for years  . Chronic kidney disease (CKD), stage III (moderate)   . Depression 1987  . Parkinson's disease ([place]) 2012  . Psoriatic arthritis ([place])   . PTSD (post-traumatic stress disorder)   . Secondary hyperparathyroidism ([place])   . Tardive dyskinesia    Past Surgical History:  Past Surgical History:  Procedure Laterality Date  . ABDOMINAL HYSTERECTOMY  1987  . CHOLECYSTECTOMY  1979  . DILATION AND CURETTAGE OF UTERUS  1973  . MASTECTOMY Bilateral 09/19/2014  . TONSILLECTOMY  1970  . URETERAL REIMPLANTION Bilateral 1974   Social History:  reports that she has  never smoked. She has never used smokeless tobacco. She reports that she does not drink alcohol or use drugs.  Family / Support Systems Marital Status: Married Patient Roles: Spouse, Parent Spouse/Significant Other: Saliah Crisp @ [phone] Children: adult daughter in the home;  son, [name] Camberos also local Anticipated Caregiver: husband Ability/Limitations of Caregiver: Min A Caregiver Availability: 24/7 Family Dynamics: Spouse here daily and has been providing caregiver support.  Good support from family and local son who is [employer]  Social History Preferred language: English Religion: Christian Cultural Background: NA Read: Yes Write: Yes Employment Status: Retired Public relations account executive Issues: None Guardian/Conservator: none - per MD, pt is not capable of making decisions on her own behalf - defer to spouse   Abuse/Neglect Abuse/Neglect Assessment Can Be Completed: Yes Physical Abuse: Denies Verbal Abuse: Denies Sexual Abuse: Denies Exploitation of patient/patient's resources: Denies Self-Neglect: Denies  Emotional Status Pt's affect, behavior and adjustment status: Pt lying in bed with spouse present.  Very pleasant and completes assessment interview without any difficulty.  She and spouse are very pleased with her progress.  Pt denies any significant emotional distress, however, will monitor and refer for neuropsychology as indicated. Recent Psychosocial Issues: moved to [place] to be closer to son several months ago Psychiatric History: H/O bipolar; PTSD Substance Abuse History: NA  Patient / Family Perceptions, Expectations & Goals Pt/Family understanding of illness & functional limitations: Pt and spouse with good, general understanding of her debility following COVID illness and exacerbation of PD symptoms. Premorbid pt/family roles/activities: Pt was relatively independent.  Spouse would provide assistance as needed. Anticipated changes  in  roles/activities/participation: Little change anticipated if pt able to reach supervision goals. Pt/family expectations/goals: Pt and spouse hopeful that she can meet goals  [employer]: None Premorbid Home Care/DME Agencies: None Transportation available at discharge: yes Resource referrals recommended: Neuropsychology  Discharge Planning Living Arrangements: Spouse/significant other, Children Support Systems: Children, Spouse/significant other Type of Residence: Private residence Insurance Resources: [employer], Multimedia programmer (specify) Financial Resources: Radio broadcast assistant Screen Referred: No Money Management: Spouse Does the patient have any problems obtaining your medications?: No Home Management: pt and spouse Patient/Family Preliminary Plans: Pt to return home with spouse who is able to provide 24/7 assistance Social Work Anticipated Follow Up Needs: HH/OP Expected length of stay: 7-10 days  Clinical Impression Very pleasant woman here for debility following COVID illness and Parkinson's dz.  Spouse at bedside and very encouraging and supportive. Spouse able to provide 24/7 assistance.  Pt without any significant emotional distress.  Will follow for d/c planning and support needs.  Keyerra Lamere 04/28/2019, 3:50 PM

## 2019-04-28 NOTE — Progress Notes (Signed)
Occupational Therapy Session Note  Patient Details  Name: Tiffany [name] MRN: [id number] Date of Birth: 1951-01-24  Today's Date: 04/28/2019 OT Individual Time: 1000-1110 OT Individual Time Calculation (min): 70 min    Short Term Goals: Week 1:  OT Short Term Goal 1 (Week 1): STG= LTG d/t ELOS  Skilled Therapeutic Interventions/Progress Updates:    1:1. Pt received in w/c agreeable to tx but declining shower and dressing. Pt stands at sink with CGA to complete oral care and set up for sequencing. Pt engages in conversation on biographical information with cards with grandkid pictures. Pt able to recall names, ages and hobbies of grandkids/children which is much improved per husband. Pt ambulates to/from ADL apartment with CGA with RW and VC for keeping feet inside walker and decreasing shuffling. Pt completes couch transfer with MIN A and shower transfer using lateral stepping method with RW with CGA as pt [place] fearful of stepping backwards with posterior method. Exited session with pt given notebook for recording dates/information. Exited session with pt setaed in w/c, exit alamr on and call light in reach  Therapy Documentation Precautions:  Precautions Precautions: Fall Restrictions Weight Bearing Restrictions: No General:   Vital Signs: Therapy Vitals Pulse Rate: 67 BP: 116/68 Pain: Pain Assessment Pain Scale: 0-10 Pain Score: 5  Pain Type: Acute pain Pain Location: Head Pain Orientation: Anterior Pain Descriptors / Indicators: Throbbing Pain Frequency: Rarely Pain Onset: Gradual Patients Stated Pain Goal: 2 Pain Intervention(s): Medication (See eMAR) ADL: ADL Eating: Set up Where Assessed-Eating: Chair Grooming: Supervision/safety Where Assessed-Grooming: Sitting at sink Upper Body Bathing: Supervision/safety Where Assessed-Upper Body Bathing: Shower Lower Body Bathing: Minimal assistance Where Assessed-Lower Body Bathing: Shower Upper Body Dressing: Minimal  assistance Where Assessed-Upper Body Dressing: Wheelchair Lower Body Dressing: Moderate assistance Where Assessed-Lower Body Dressing: Wheelchair Toileting: Minimal assistance Where Assessed-Toileting: Glass blower/designer: Psychiatric nurse Method: Counselling psychologist: Energy manager: Environmental education officer Method: Heritage manager: Civil engineer, contracting with back Glass blower/designer   Exercises:   Other Treatments:     Therapy/Group: Individual Therapy  [name] 04/28/2019, 11:14 AM

## 2019-04-28 NOTE — Progress Notes (Signed)
[facility] PHYSICAL MEDICINE & REHABILITATION PROGRESS NOTE   Subjective/Complaints: She has a great attitude and is very optimistic about her care here. Says she is now ambulating from bed to bathroom independently.  Tremor is present in bilateral hands and rest, but is much improved as per patient.  Denies pain, constipation.   ROS: Patient denies fever, rash, sore throat, blurred vision, nausea, vomiting, diarrhea, cough, shortness of breath or chest pain, joint or back pain, headache, or mood change.   Objective:   No results found. Recent Labs    04/27/19 0544  WBC 7.0  HGB 12.9  HCT 41.4  PLT 199   Recent Labs    04/26/19 0737 04/27/19 0544  NA 144 142  K 3.5 3.6  CL 109 112*  CO2 23 21*  GLUCOSE 115* 95  BUN 9 10  CREATININE 1.77* 1.88*  CALCIUM 9.1 9.1    Intake/Output Summary (Last 24 hours) at 04/28/2019 1138 Last data filed at 04/27/2019 2109 Gross per 24 hour  Intake 554 ml  Output --  Net 554 ml     Physical Exam: Vital Signs Blood pressure 116/68, pulse 67, temperature 97.7 F (36.5 C), resp. rate 16, height 5\' 4"  (1.626 m), weight 94.8 kg, SpO2 97 %. Constitutional: No distress . Vital signs reviewed. Sitting up in wheelchair. HEENT: EOMI, oral membranes moist Neck: supple Cardiovascular: RRR without murmur. No JVD    Respiratory: CTA Bilaterally without wheezes or rales. Normal effort    GI: BS +, non-tender, non-distended  Musculoskeletal:  Cervical back: Normal range of motion.  No ankle tenderness Neurological: She is alertand oriented to person, place, and time. She displays resting/intentional tremor in bilateral hands. Masked facies. No rigidity Motor strength is 5/5 bilateral deltoid bicep tricep grip 3 - bilateral hip flexors 4 - bilateral knee extensors and 4 - bilateral ankle dorsiflexors. No ataxia.  Skin: Skin iswarmand dry. Abrasions on both knees. Psych: very pleasant, cooperative    Assessment/Plan: 1. Functional  deficits secondary to debility, gait disorder which require 3+ hours per day of interdisciplinary therapy in a comprehensive inpatient rehab setting.  Physiatrist is providing close team supervision and 24 hour management of active medical problems listed below.  Physiatrist and rehab team continue to assess barriers to discharge/monitor patient progress toward functional and medical goals  Care Tool:  Bathing    Body parts bathed by patient: Right arm, Left arm, Right lower leg, Left lower leg, Face, Chest, Abdomen, Front perineal area, Right upper leg, Buttocks, Left upper leg         Bathing assist Assist Level: Minimal Assistance - Patient > 75%     Upper Body Dressing/Undressing Upper body dressing   What is the patient wearing?: Pull over shirt    Upper body assist Assist Level: Minimal Assistance - Patient > 75%    Lower Body Dressing/Undressing Lower body dressing      What is the patient wearing?: Underwear/pull up, Pants     Lower body assist Assist for lower body dressing: Minimal Assistance - Patient > 75%     Toileting Toileting    Toileting assist Assist for toileting: Minimal Assistance - Patient > 75%     Transfers Chair/bed transfer  Transfers assist     Chair/bed transfer assist level: Minimal Assistance - Patient > 75%     Locomotion Ambulation   Ambulation assist      Assist level: Minimal Assistance - Patient > 75% Assistive device: No Device Max distance: 4'  Walk 10 feet activity   Assist     Assist level: Minimal Assistance - Patient > 75% Assistive device: No Device   Walk 50 feet activity   Assist    Assist level: Minimal Assistance - Patient > 75% Assistive device: No Device    Walk 150 feet activity   Assist Walk 150 feet activity did not occur: Safety/medical concerns         Walk 10 feet on uneven surface  activity   Assist Walk 10 feet on uneven surfaces activity did not occur: Safety/medical  concerns         Wheelchair     Assist Will patient use wheelchair at discharge?: No   Wheelchair activity did not occur: N/A         Wheelchair 50 feet with 2 turns activity    Assist    Wheelchair 50 feet with 2 turns activity did not occur: N/A       Wheelchair 150 feet activity     Assist  Wheelchair 150 feet activity did not occur: N/A       Blood pressure 116/68, pulse 67, temperature 97.7 F (36.5 C), resp. rate 16, height 5\' 4"  (1.626 m), weight 94.8 kg, SpO2 97 %.  Medical Problem List and Plan: 1.  Impaired mobility and ADLs secondary to Debility and encephalopathy in a patient with Parkinson's disease with recent Covid infection.             -patient may shower             -ELOS/Goals: supervision  -Continue PT, OT, SLP. ELOS 7-11 days 2.  Antithrombotics: -DVT/anticoagulation:  Pharmaceutical: Heparin             -antiplatelet therapy: will dc ASA per pt request. See no obvious need at present 3. Pain Management: Lidocaine patch for LBP/ tylenol prn.              -well controlled  2/17: Has mild headache, will be receiving tylenol now.  4. Mood: Team to provide ego support. LCSW to follow for evaluation and support.           -would benefit from neuropsych follow-up. See#9. Also has anxiety and depression.   2/17: patient states that she will be meeting with neuropsych today             -antipsychotic agents: Lamictal 5. Neuropsych: This patient is not capable of making decisions on her own behalf. 6. Skin/Wound Care: Routine pressure relief measures.  7. Fluids/Electrolytes/Nutrition: encourage PO  -I personally reviewed the patient's labs    8. New onset seizures: On Keppra bid 9. Bipolar disorder: Last admission- Stable currently on Lamictal 100 mg/hs with Klonopin at night and prn lorazepam.  10. Parkinsonism/Tardrive dyskinesias:   -mild intentional tremors currently, much improved as per patient.  11. Hypothyroidism: Now on low dose  synthyroid. Repeat TSH in 4 weeks. 12. Acute on chronic renal failure: On NaHCo3 bid with calcitriol for secondary hyperparathyroidism. Hypernatremia has resolved with SCr --1.77 at baseline  -1.88 Cr 2/16.  13. Leucocytosis/Thrombocytopenia:  199 on 2/16  14. HTN: Amlodipine 2.5mg  daily. Flowsheet personally reviewed. BP remains uncontrolled.    -Increased Amlodipine to 5mg  daily.  2/16 bp's under better control  2/17: flowsheet reviewed. BP labile, highest at night. Continue to monitor.  15. Chronic diarrhea: Patient has had for 30 years. Currently on Imodium.  16. Increased LFT's:   2/16 slight bump from labs last week.   -lamictal can  be associated with liver damage  -recheck LFT's Friday to see if there is further increase 17. Impaired memory: Appears to be improving. Patient states she was able to recall all her grandchildren's names today, which she was having trouble with before.     LOS: 2 days A FACE TO FACE EVALUATION WAS PERFORMED  Clide Deutscher Sarah Baez 04/28/2019, 11:38 AM

## 2019-04-28 NOTE — Progress Notes (Signed)
Speech Language Pathology Daily Session Note  Patient Details  Name: Tiffany [name] MRN: [id number] Date of Birth: Jul 30, 1950  Today's Date: 04/28/2019 SLP Individual Time: 0715-0810 SLP Individual Time Calculation (min): 55 min  Short Term Goals: Week 1: SLP Short Term Goal 1 (Week 1): STGs=LTGs due to ELOS  Skilled Therapeutic Interventions: Skilled treatment session focused on dysphagia and cognitive goals. Upon arrival, patient requested to use the bathroom and required extra time for ambulation but was overall Mod I for safety. SLP facilitated session by providing intermittent set-up assist with breakfast meal of Dys. 3 textures with thin liquids. Patient consumed meal with overall Mod I for use of swallowing compensatory strategies and without overt s/s of aspiration. Recommend patient continue current diet. SLP also facilitated session by providing education about external aids in her room that she can utilize on a daily basis to maximize recall of daily information (baord in room, schedule). Patient utilized these external aids to locate daily information with overall extra time and Min verbal and visual cues. Patient reported she was having difficulty recalling the names of her grandchildren, however, patient independently recalled all 5 names when looking at pictures. Patient reported a headache towards end of session and utilized the call bell to request medications with overall supervision verbal cues. Patient left supine in bed with alarm on and all needs within reach. Continue with current plan of care.      Pain Pain Assessment Pain Scale: 0-10 Pain Score: 5  Pain Type: Acute pain Pain Location: Head Pain Descriptors / Indicators: Aching Pain Onset: Gradual Patients Stated Pain Goal: 1 Pain Intervention(s): RN made aware  Therapy/Group: Individual Therapy  Tiffany [name] 04/28/2019, 8:15 AM

## 2019-04-29 ENCOUNTER — Inpatient Hospital Stay (HOSPITAL_COMMUNITY): Payer: Medicare Other | Admitting: Speech Pathology

## 2019-04-29 ENCOUNTER — Inpatient Hospital Stay (HOSPITAL_COMMUNITY): Payer: Medicare Other | Admitting: Physical Therapy

## 2019-04-29 ENCOUNTER — Inpatient Hospital Stay (HOSPITAL_COMMUNITY): Payer: Medicare Other

## 2019-04-29 NOTE — Progress Notes (Signed)
Physical Therapy Session Note  Patient Details  Name: Tiffany [name] MRN: [id number] Date of Birth: 05/28/1950  Today's Date: 04/29/2019 PT Individual Time: 0865-7846 PT Individual Time Calculation (min): 44 min   Short Term Goals: Week 1:  PT Short Term Goal 1 (Week 1): =LTGs due to ELOS  Skilled Therapeutic Interventions/Progress Updates:   Pt in supine and agreeable to therapy, 5/10 pain in L hip. She attributes this to arthritis. Supine>sit w/ supervision and ambulated to/from therapy gym w/ CGA using RW. Notable increase in gait speed this session. Worked on endurance and global strengthening in decreased WB position on NuStep. Performed 5 min x2 @ level 4 w/ all extremities, pt rates it 7/10 on modified-RPE. Worked on memory and recall of events during previous therapy sessions. Pt able to accurately recall working on calendar task w/ ST and playing cornhole w/ OT. Pt w/ many questions about what exactly happened to her brain to have cognitive impairments. Educated on effects of encephalopathy and encouraged pt that she can make improvements in her cognition though it will not happen overnight. Pt verbalized understanding. Ambulated back to room w/ CGA. Ended session in supine, all needs in reach. Assisted pt w/ calling her husband on her personal phone. Needed min cues to problem solve. Also added her husband to her "favorites" for ease of calling as she was overwhelmed by a list of contacts.   Therapy Documentation Precautions:  Precautions Precautions: Fall Restrictions Weight Bearing Restrictions: No Vital Signs: Therapy Vitals Temp: 97.7 F (36.5 C) Temp Source: Oral Pulse Rate: 70 Resp: 16 BP: 134/81 Patient Position (if appropriate): Sitting Oxygen Therapy SpO2: 99 % O2 Device: Room Air  Therapy/Group: Individual Therapy  Lamica Mccart Clent Demark 04/29/2019, 3:29 PM

## 2019-04-29 NOTE — Progress Notes (Signed)
Physical Therapy Session Note  Patient Details  Name: Tiffany [name] MRN: [id number] Date of Birth: May 16, 1950  Today's Date: 04/29/2019 PT Individual Time: 0800-0900 PT Individual Time Calculation (min): 60 min   Short Term Goals: Week 1:  PT Short Term Goal 1 (Week 1): =LTGs due to ELOS  Skilled Therapeutic Interventions/Progress Updates: Pt presents sitting EOB eating breakfast w/ c/o arthritis pain.  Pt agreeable to participate to the extent she can.  Pt performed seated there ex at EOB.  Pt performed 3 x 10-15 calf raises, LAQ, hip flexion and abd/add. W/o trunk or UE support.  Pt performed multiple sit to stand transfers w/ min A.  Pt amb in room multiple trials, including crossing threshhold to BR w/o LOB.  Pt educated on speed and safety as well as w/ posture.  Pt performed standing and eating at raised table, sidestepping and marching to challenge balance.  Pt left seated at EOB w/ all needs within reach and bed alarm on.     Therapy Documentation Precautions:  Precautions Precautions: Fall Restrictions Weight Bearing Restrictions: No General:   Vital Signs: Therapy Vitals Pulse Rate: 60 BP: 111/65 Patient Position (if appropriate): Lying Oxygen Therapy SpO2: 96 % Pain: Pain Assessment Pain Scale: 0-10 Pain Score: 7  Pain Type: Chronic pain Pain Location: Hip Pain Orientation: Left Pain Descriptors / Indicators: Throbbing Pain Frequency: Intermittent Pain Onset: On-going Patients Stated Pain Goal: 2 Pain Intervention(s): Medication (See eMAR) 7/10 pain generally, but mostly left hip. Hot packs given by nursing w/ relief.   Therapy/Group: Individual Therapy  Ladoris Gene 04/29/2019, 9:03 AM

## 2019-04-29 NOTE — Plan of Care (Signed)
  Problem: Consults Goal: RH GENERAL PATIENT EDUCATION Description: See Patient Education module for education specifics. Outcome: Progressing Goal: Skin Care Protocol Initiated - if Braden Score 18 or less Description: If consults are not indicated, leave blank or document N/A Outcome: Progressing   Problem: RH BOWEL ELIMINATION Goal: RH STG MANAGE BOWEL WITH ASSISTANCE Description: STG Manage Bowel with min Assistance. Outcome: Progressing   Problem: RH BLADDER ELIMINATION Goal: RH STG MANAGE BLADDER WITH ASSISTANCE Description: STG Manage Bladder With min Assistance Outcome: Progressing   Problem: RH SKIN INTEGRITY Goal: RH STG MAINTAIN SKIN INTEGRITY WITH ASSISTANCE Description: STG Maintain Skin Integrity With min Assistance. Outcome: Progressing Goal: RH STG ABLE TO PERFORM INCISION/WOUND CARE W/ASSISTANCE Description: STG Able To Perform Incision/Wound Care With min Assistance. Outcome: Progressing   Problem: RH SAFETY Goal: RH STG ADHERE TO SAFETY PRECAUTIONS W/ASSISTANCE/DEVICE Description: STG Adhere to Safety Precautions With min Assistance and appropriate assistive Device. Outcome: Progressing   Problem: RH PAIN MANAGEMENT Goal: RH STG PAIN MANAGED AT OR BELOW PT'S PAIN GOAL Description: <3 on a 0-10 pain scale Outcome: Progressing   Problem: RH KNOWLEDGE DEFICIT GENERAL Goal: RH STG INCREASE KNOWLEDGE OF SELF CARE AFTER HOSPITALIZATION Description: Patient and spouse with demonstrate knowledge of medication management, dietary management, and follow up care with the MD post discharge with min assist from staff. Outcome: Progressing

## 2019-04-29 NOTE — Progress Notes (Addendum)
[place] PHYSICAL MEDICINE & REHABILITATION PROGRESS NOTE   Subjective/Complaints: Having some pain in her left hip. Continues to make progress with therapies. Tremor is better but remains present. Able to feed self without too many problems  ROS: Patient denies fever, rash, sore throat, blurred vision, nausea, vomiting, diarrhea, cough, shortness of breath or chest pain,  headache, or mood change.    Objective:   No results found. Recent Labs    04/27/19 0544  WBC 7.0  HGB 12.9  HCT 41.4  PLT 199   Recent Labs    04/27/19 0544  NA 142  K 3.6  CL 112*  CO2 21*  GLUCOSE 95  BUN 10  CREATININE 1.88*  CALCIUM 9.1    Intake/Output Summary (Last 24 hours) at 04/29/2019 1022 Last data filed at 04/29/2019 0900 Gross per 24 hour  Intake 360 ml  Output --  Net 360 ml     Physical Exam: Vital Signs Blood pressure 111/65, pulse 60, temperature 97.8 F (36.6 C), temperature source Oral, resp. rate 16, height 5\' 4"  (1.626 m), weight 91.3 kg, SpO2 96 %. Constitutional: No distress . Vital signs reviewed. HEENT: EOMI, oral membranes moist Neck: supple Cardiovascular: RRR without murmur. No JVD    Respiratory: CTA Bilaterally without wheezes or rales. Normal effort    GI: BS +, non-tender, non-distended  Musculoskeletal:  Cervical back: Normal range of motion.  No ankle tenderness. Mild pain left greater troch Neurological: She is very alertand oriented to person, place, and time. Good insight. She displays resting low frequency tremor in bilateral hands, legs. A little better with volitional movement. Masked facies. No rigidity. Good sitting balance Motor strength is 5/5 bilateral deltoid bicep tricep grip 3 - bilateral hip flexors 4 - bilateral knee extensors and 4 - bilateral ankle dorsiflexors. No ataxia.  Skin: Skin iswarmand dry. Abrasions on both knees are healing. Psych: very pleasant, cooperative    Assessment/Plan: 1. Functional deficits secondary to  debility, gait disorder which require 3+ hours per day of interdisciplinary therapy in a comprehensive inpatient rehab setting.  Physiatrist is providing close team supervision and 24 hour management of active medical problems listed below.  Physiatrist and rehab team continue to assess barriers to discharge/monitor patient progress toward functional and medical goals  Care Tool:  Bathing    Body parts bathed by patient: Right arm, Left arm, Right lower leg, Left lower leg, Face, Chest, Abdomen, Front perineal area, Right upper leg, Buttocks, Left upper leg         Bathing assist Assist Level: Minimal Assistance - Patient > 75%     Upper Body Dressing/Undressing Upper body dressing   What is the patient wearing?: (Jacket)    Upper body assist Assist Level: Moderate Assistance - Patient 50 - 74%    Lower Body Dressing/Undressing Lower body dressing      What is the patient wearing?: Underwear/pull up, Pants     Lower body assist Assist for lower body dressing: Minimal Assistance - Patient > 75%     Toileting Toileting    Toileting assist Assist for toileting: Minimal Assistance - Patient > 75%     Transfers Chair/bed transfer  Transfers assist     Chair/bed transfer assist level: Minimal Assistance - Patient > 75%     Locomotion Ambulation   Ambulation assist      Assist level: Minimal Assistance - Patient > 75% Assistive device: No Device Max distance: 100'   Walk 10 feet activity   Assist  Assist level: Minimal Assistance - Patient > 75% Assistive device: No Device   Walk 50 feet activity   Assist    Assist level: Minimal Assistance - Patient > 75% Assistive device: No Device    Walk 150 feet activity   Assist Walk 150 feet activity did not occur: Safety/medical concerns         Walk 10 feet on uneven surface  activity   Assist Walk 10 feet on uneven surfaces activity did not occur: Safety/medical concerns          Wheelchair     Assist Will patient use wheelchair at discharge?: No   Wheelchair activity did not occur: N/A         Wheelchair 50 feet with 2 turns activity    Assist    Wheelchair 50 feet with 2 turns activity did not occur: N/A       Wheelchair 150 feet activity     Assist  Wheelchair 150 feet activity did not occur: N/A       Blood pressure 111/65, pulse 60, temperature 97.8 F (36.6 C), temperature source Oral, resp. rate 16, height 5\' 4"  (1.626 m), weight 91.3 kg, SpO2 96 %.  Medical Problem List and Plan: 1.  Impaired mobility and ADLs secondary to Debility and encephalopathy in a patient with Parkinson's disease with recent Covid infection.             -patient may shower             -ELOS/Goals: supervision  -Continue PT, OT, SLP. ELOS 7-11 days 2.  Antithrombotics: -DVT/anticoagulation:  Pharmaceutical: Heparin             -antiplatelet therapy: will dc ASA per pt request. See no obvious need at present 3. Pain Management: Lidocaine patch for LBP/ tylenol prn.              -well controlled  2/17: tylenol for headache  -added kpad for left hip pain, mild troch bursitis?.  4. Mood: fairly up beat. Team providing ego support.            -would benefit from neuropsych follow-up. See#9. Also has anxiety and depression.   2/18 neuropsych eval pending             -antipsychotic agents: Lamictal 5. Neuropsych: This patient is not capable of making decisions on her own behalf.  -memory improving since hospital admission 6. Skin/Wound Care: Routine pressure relief measures.  7. Fluids/Electrolytes/Nutrition: encourage PO  -appetite reasonable  8. New onset seizures: On Keppra bid 9. Bipolar disorder: Last admission- Stable currently on Lamictal 100 mg/hs with Klonopin at night and prn lorazepam.  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  11. Hypothyroidism: Now on low dose  synthyroid. Repeat TSH in 4 weeks. 12. Acute on chronic renal failure: On NaHCo3 bid with calcitriol for secondary hyperparathyroidism. Hypernatremia has resolved with SCr --1.77 at baseline  -1.88 Cr 2/16.  13. Leucocytosis/Thrombocytopenia:  199 on 2/16  14. HTN: Amlodipine 2.5mg  daily. Flowsheet personally reviewed. BP remains uncontrolled.    -Increased Amlodipine to 5mg  daily.  2/18 bp's normalizing, more consistent. Continue to follow 15. Chronic diarrhea: Patient has had for 30 years. Currently on Imodium.  16. Increased LFT's:   2/16 slight bump from labs last week.   -lamictal can be associated with liver damage  -recheck LFT's Friday to see if there is further increase      LOS: 3  days A FACE TO FACE EVALUATION WAS PERFORMED  Meredith Staggers 04/29/2019, 10:22 AM

## 2019-04-29 NOTE — IPOC Note (Signed)
Overall Plan of Care [facility]) Patient Details Name: Tiffany [name] MRN: [id number] DOB: March 07, 1951  Admitting Diagnosis: Encephalopathy  Hospital Problems: Principal Problem:   Encephalopathy     Functional Problem List: Nursing Bladder, Bowel, Endurance, Medication Management, Safety, Nutrition  PT Balance, Behavior, Endurance, Motor, Safety  OT Balance, Safety, Cognition, Endurance, Motor  SLP Cognition, Nutrition  TR         Basic ADL's: OT Bathing, Dressing, Toileting     Advanced  ADL's: OT       Transfers: PT Bed Mobility, Bed to Chair, Car, Furniture, Floor  OT Toilet, Tub/Shower     Locomotion: PT Ambulation, Stairs     Additional Impairments: OT None  SLP Swallowing, Social Cognition   Problem Solving, Memory  TR      Anticipated Outcomes Item Anticipated Outcome  Self Feeding no goal set  Swallowing  Mod I   Basic self-care  (S)  Toileting  (S)   Bathroom Transfers (S)  Bowel/Bladder  Min assist with transfers to [employer]  Transfers  supervision  Locomotion  supervision w/ LRAD  Communication     Cognition  Supervision-Min A  Pain  <3 on a 0-10 pain scale  Safety/Judgment  min assist with transfers   Therapy Plan: PT Intensity: Minimum of 1-2 x/day ,45 to 90 minutes PT Frequency: 5 out of 7 days PT Duration Estimated Length of Stay: 7-10 days OT Intensity: Minimum of 1-2 x/day, 45 to 90 minutes OT Frequency: 5 out of 7 days OT Duration/Estimated Length of Stay: 7-10 days SLP Intensity: Minumum of 1-2 x/day, 30 to 90 minutes SLP Frequency: 3 to 5 out of 7 days SLP Duration/Estimated Length of Stay: 7-10 days   Due to the current state of emergency, patients may not be receiving their 3-hours of Medicare-mandated therapy.   Team Interventions: Nursing Interventions Patient/Family Education, Pain Management, Bladder Management, Medication Management, Discharge Planning, Psychosocial Support, Bowel Management, Disease  Management/Prevention  PT interventions Ambulation/gait training, Cognitive remediation/compensation, Discharge planning, DME/adaptive equipment instruction, Functional mobility training, Pain management, Psychosocial support, Splinting/orthotics, Therapeutic Activities, UE/LE Strength taining/ROM, UE/LE Coordination activities, Therapeutic Exercise, Stair training, Skin care/wound management, Patient/family education, Disease management/prevention, Functional electrical stimulation, Neuromuscular re-education, Community reintegration, Training and development officer  OT Interventions Training and development officer, Discharge planning, Self Care/advanced ADL retraining, Therapeutic Activities, UE/LE Coordination activities, Cognitive remediation/compensation, Functional mobility training, Patient/family education, Skin care/wound managment, Therapeutic Exercise, Community reintegration, Engineer, drilling, Psychosocial support, UE/LE Strength taining/ROM  SLP Interventions Cognitive remediation/compensation, Dysphagia/aspiration precaution training, Internal/external aids, English as a second language teacher, Environmental controls, Therapeutic Activities, Patient/family education, Functional tasks  TR Interventions    SW/CM Interventions Discharge Planning, Psychosocial Support, Patient/Family Education   Barriers to Discharge MD  Medical stability  Nursing Inaccessible home environment, Incontinence, Home environment access/layout, Medication compliance    PT Inaccessible home environment, Medical stability    OT      SLP      SW       Team Discharge Planning: Destination: PT-Home ,OT- Home , SLP-Home Projected Follow-up: PT-Outpatient PT, OT-  Outpatient OT, SLP-24 hour supervision/assistance(TBD) Projected Equipment Needs: PT-To be determined, OT- To be determined, SLP-None recommended by SLP Equipment Details: PT- , OT-  Patient/family involved in discharge planning: PT- Patient,  OT-Patient,  Patient unable/family or caregiver not available, SLP-Patient, Family member/caregiver  MD ELOS: 7-10 days Medical Rehab Prognosis:  Excellent Assessment: The patient has been admitted for CIR therapies with the diagnosis of encephalopathy and debility. The team will be addressing functional mobility, strength, stamina, balance, safety,  adaptive techniques and equipment, self-care, bowel and bladder mgt, patient and caregiver education, NMR, fine motor coordination, cognition and communication. Goals have been set at supervision for self-care and mobility and supervision to min assist for cognition.   Due to the current state of emergency, patients may not be receiving their 3 hours per day of Medicare-mandated therapy.    Meredith Staggers, MD, FAAPMR      See Team Conference Notes for weekly updates to the plan of care

## 2019-04-29 NOTE — Progress Notes (Signed)
Dry area on LLE with redness. C/O irritation to area. Moisturizer cream applied. Pt stated "relief."

## 2019-04-29 NOTE — Progress Notes (Signed)
Speech Language Pathology Daily Session Note  Patient Details  Name: Kaileigh Viswanathan MRN: [id number] Date of Birth: 09-18-1950  Today's Date: 04/29/2019 SLP Individual Time: 1000-1055 SLP Individual Time Calculation (min): 55 min  Short Term Goals: Week 1: SLP Short Term Goal 1 (Week 1): STGs=LTGs due to ELOS  Skilled Therapeutic Interventions: Skilled treatment session focused on cognitive goals. SLP facilitated session by providing supervision verbal cues to self-monitor and correct errors during a mildly complex calendar organizational task. SLP also provided Min A verbal cues for reasoning while filling out a questionnaire that focused on emergent awareness of balance and safety. Patient left supine in bed with alarm on and all needs within reach. Continue with current plan of care.      Pain No/Denies Pain   Therapy/Group: Individual Therapy  Jadelin Eng 04/29/2019, 12:15 PM

## 2019-04-29 NOTE — Progress Notes (Signed)
Occupational Therapy Session Note  Patient Details  Name: Tiffany [name] MRN: [id number] Date of Birth: 05-03-1950  Today's Date: 04/29/2019 OT Individual Time: 1130-1157 OT Individual Time Calculation (min): 27 min    Short Term Goals: Week 1:  OT Short Term Goal 1 (Week 1): STG= LTG d/t ELOS  Skilled Therapeutic Interventions/Progress Updates:    1:1. Pt received in bed declining bahting and dressing. Pt completes ambulation with RW to dayroom with no rest break demo improved endurance and CGA. Pt plays 2 trials of cornhole with AD first round and second no AD and CGA. Pt able to retrieve items off floor with CGA and no LOB. Exited session with pt seated in w/c, call light tin reach and all needs met.   Therapy Documentation Precautions:  Precautions Precautions: Fall Restrictions Weight Bearing Restrictions: No General:   Vital Signs: Therapy Vitals Pulse Rate: 60 BP: 111/65 Patient Position (if appropriate): Lying Oxygen Therapy SpO2: 96 % Pain: Pain Assessment Pain Scale: 0-10 Pain Score: 7  Pain Type: Chronic pain Pain Location: Hip Pain Orientation: Left Pain Descriptors / Indicators: Throbbing Pain Frequency: Intermittent Pain Onset: On-going Patients Stated Pain Goal: 2 Pain Intervention(s): Medication (See eMAR) ADL: ADL Eating: Set up Where Assessed-Eating: Chair Grooming: Supervision/safety Where Assessed-Grooming: Sitting at sink Upper Body Bathing: Supervision/safety Where Assessed-Upper Body Bathing: Shower Lower Body Bathing: Minimal assistance Where Assessed-Lower Body Bathing: Shower Upper Body Dressing: Minimal assistance Where Assessed-Upper Body Dressing: Wheelchair Lower Body Dressing: Moderate assistance Where Assessed-Lower Body Dressing: Wheelchair Toileting: Minimal assistance Where Assessed-Toileting: Glass blower/designer: Psychiatric nurse Method: Counselling psychologist: Statistician: Environmental education officer Method: Heritage manager: Civil engineer, contracting with back Glass blower/designer   Exercises:   Other Treatments:     Therapy/Group: Individual Therapy  [name] 04/29/2019, 11:47 AM

## 2019-04-30 ENCOUNTER — Inpatient Hospital Stay (HOSPITAL_COMMUNITY): Payer: Medicare Other | Admitting: Physical Therapy

## 2019-04-30 ENCOUNTER — Inpatient Hospital Stay (HOSPITAL_COMMUNITY): Payer: Medicare Other | Admitting: Speech Pathology

## 2019-04-30 ENCOUNTER — Inpatient Hospital Stay (HOSPITAL_COMMUNITY): Payer: Medicare Other

## 2019-04-30 LAB — HEPATIC FUNCTION PANEL
ALT: 57 U/L — ABNORMAL HIGH (ref 0–44)
AST: 34 U/L (ref 15–41)
Albumin: 3 g/dL — ABNORMAL LOW (ref 3.5–5.0)
Alkaline Phosphatase: 93 U/L (ref 38–126)
Bilirubin, Direct: <0.1 mg/dL (ref 0.0–0.2)
Total Bilirubin: 0.4 mg/dL (ref 0.3–1.2)
Total Protein: 5.9 g/dL — ABNORMAL LOW (ref 6.5–8.1)

## 2019-04-30 NOTE — Plan of Care (Signed)
  Problem: Consults Goal: RH GENERAL PATIENT EDUCATION Description: See Patient Education module for education specifics. Outcome: Progressing Goal: Skin Care Protocol Initiated - if Braden Score 18 or less Description: If consults are not indicated, leave blank or document N/A Outcome: Progressing   Problem: RH BOWEL ELIMINATION Goal: RH STG MANAGE BOWEL WITH ASSISTANCE Description: STG Manage Bowel with min Assistance. Outcome: Progressing   Problem: RH BLADDER ELIMINATION Goal: RH STG MANAGE BLADDER WITH ASSISTANCE Description: STG Manage Bladder With min Assistance Outcome: Progressing   Problem: RH SKIN INTEGRITY Goal: RH STG MAINTAIN SKIN INTEGRITY WITH ASSISTANCE Description: STG Maintain Skin Integrity With min Assistance. Outcome: Progressing Goal: RH STG ABLE TO PERFORM INCISION/WOUND CARE W/ASSISTANCE Description: STG Able To Perform Incision/Wound Care With min Assistance. Outcome: Progressing   Problem: RH SAFETY Goal: RH STG ADHERE TO SAFETY PRECAUTIONS W/ASSISTANCE/DEVICE Description: STG Adhere to Safety Precautions With min Assistance and appropriate assistive Device. Outcome: Progressing   Problem: RH PAIN MANAGEMENT Goal: RH STG PAIN MANAGED AT OR BELOW PT'S PAIN GOAL Description: <3 on a 0-10 pain scale Outcome: Progressing   Problem: RH KNOWLEDGE DEFICIT GENERAL Goal: RH STG INCREASE KNOWLEDGE OF SELF CARE AFTER HOSPITALIZATION Description: Patient and spouse with demonstrate knowledge of medication management, dietary management, and follow up care with the MD post discharge with min assist from staff. Outcome: Progressing

## 2019-04-30 NOTE — Progress Notes (Signed)
Physical Therapy Session Note  Patient Details  Name: Tiffany [name] MRN: [id number] Date of Birth: January 28, 1951  Today's Date: 04/30/2019 PT Individual Time: 4098-1191 AND 1430-1510 PT Individual Time Calculation (min): 69 min AND 40 min  Short Term Goals: Week 1:  PT Short Term Goal 1 (Week 1): =LTGs due to ELOS  Skilled Therapeutic Interventions/Progress Updates:   Session 1:  Pt in supine and agreeable to therapy, pain as detailed below. Supervision bed mobility and ambulated to therapy gym w/ CGA using RW. Practiced gait w/ rollator as this is what she has at home. Gait speed increased to more functional speed and no increased in instability/balance impairments, CGA as well. She does need verbal and visual cues for brake management. Practiced using rollator to collect horseshoes in hallway, min cues to stop completely before reaching for one, and practiced negotiating cones. Pt needs frequent verbal cues to keep rollator closer to her. Pt c/o dizziness throughout session, BP 136/80 and 148/88 when checked which is WNL for her. Pt denied HA or vision changes. Needed a few extended seated rest breaks during session 2/2 dizziness. Worked on cognitive tasks in seated emphasizing problem solving, error correction, and visuospatial awareness including simple to complex pipe trees and complex peg board task. Needed min verbal/tactile cues to correct 4-5 errors throughout but overall much improved since last attempt these tasks w/ this therapist. Ambulated to day room w/ CGA-close supervision using rollator. Pt reported feeling overall "not right", denies pain and reports ongoing dizziness. Vitals continued to stay WNL and pt agreeable to continue. NuStep 5 min @ level 4 w/ no increase in her overall ill feelings, it remained constant.   Ended session in supine, all needs in reach. Made PA aware of symptoms.   Session 2:  Pt in supine and agreeable to therapy, denies pain. Supervision bed mobility.  Ambulated to/from therapy gym w/ CGA-close supervision using rollator. Min cues for rollator management. Worked on dynamic standing balance while pinning clothespins to basketball hoop. Performed on firm surface, progressing to feet together on firm, and then on foam surface. Reaching emphasized facilitating lateral weight shifting, trunk rotations, and squatting. Performed w/ CGA on firm, min assist on foam surface. Stood in multiple 2-3 min bouts. Also performed multiple reps of sit<>stands on foam surface w/ min assist to stabilize in stance, verbal and tactile cues for weight shifting strategies. Ambulated back to room and ended session in supine, all needs in reach. Provided pt w/ written handout of safety checklist for home for pt's husband to review.   Therapy Documentation Precautions:  Precautions Precautions: Fall Restrictions Weight Bearing Restrictions: No Vital Signs: Therapy Vitals Pulse Rate: (!) 59 BP: 138/81 Patient Position (if appropriate): Sitting Pain: Pain Assessment Pain Scale: 0-10 Pain Score: 7  Pain Type: Acute pain Pain Location: Ankle Pain Orientation: Right;Left Pain Descriptors / Indicators: Aching Pain Frequency: Intermittent Patients Stated Pain Goal: 4 Pain Intervention(s): Medication (See eMAR)  Therapy/Group: Individual Therapy  Reannon Candella Clent Demark 04/30/2019, 11:54 AM

## 2019-04-30 NOTE — Progress Notes (Signed)
Speech Language Pathology Daily Session Note  Patient Details  Name: Tiffany [name] MRN: [id number] Date of Birth: 1950/08/24  Today's Date: 04/30/2019 SLP Individual Time: 1330-1416 SLP Individual Time Calculation (min): 46 min  Short Term Goals: Week 1: SLP Short Term Goal 1 (Week 1): STGs=LTGs due to ELOS  Skilled Therapeutic Interventions: Pt was seen for skilled ST targeting cognitive goals. Pt reported difficulty with recall for use of her cell phone, and given that PT created aid for pt to recall how to reach her husband, SLP provided opportunity for pt to demonstrate recall of this information, which she did with 50% accuracy (use aid to enter passcode for phone but unaware how to locate husband's #) without Moderate verbal and visual cueing. Pt did use aids to recall names of family members in pictures Mod I. Min A verbal and visual cues as well as extra time for processing provided in order for pt to complete functional semi-complex math scenarios from [employer]. Pt requested to use restroom during session, during which only Supervision A verbal cueing required for safety awareness with use of walker (and she was continent of bowel). Pt left sitting in wheelchair with alarm set and needs within reach. Continue per current plan of care.       Pain Pain Assessment Pain Scale: 0-10 Pain Score: 0-No pain  Therapy/Group: Individual Therapy  Arbutus Leas 04/30/2019, 2:23 PM

## 2019-04-30 NOTE — Progress Notes (Signed)
[place] PHYSICAL MEDICINE & REHABILITATION PROGRESS NOTE   Subjective/Complaints: Some thigh/leg soreness from therapy. Happy with progress. Slept well  ROS: Patient denies fever, rash, sore throat, blurred vision, nausea, vomiting, diarrhea, cough, shortness of breath or chest pain, joint or back pain, headache, or mood change.    Objective:   No results found. No results for input(s): WBC, HGB, HCT, PLT in the last 72 hours. No results for input(s): NA, K, CL, CO2, GLUCOSE, BUN, CREATININE, CALCIUM in the last 72 hours.  Intake/Output Summary (Last 24 hours) at 04/30/2019 1029 Last data filed at 04/30/2019 0800 Gross per 24 hour  Intake 624 ml  Output --  Net 624 ml     Physical Exam: Vital Signs Blood pressure 138/81, pulse (!) 59, temperature 98.4 F (36.9 C), temperature source Oral, resp. rate 16, height 5\' 4"  (1.626 m), weight 93.1 kg, SpO2 97 %. Constitutional: No distress . Vital signs reviewed. HEENT: EOMI, oral membranes moist Neck: supple Cardiovascular: RRR without murmur. No JVD    Respiratory: CTA Bilaterally without wheezes or rales. Normal effort    GI: BS +, non-tender, non-distended  Musculoskeletal:  Cervical back: Normal range of motion.  No ankle tenderness. Mild pain left TFL, right side too to lesser extent Neurological: She is very alertand oriented to person, place, and time. Good insight. Low frequency resting tremor.  Motor strength is 5/5 bilateral deltoid bicep tricep grip 3 - bilateral hip flexors 4 - bilateral knee extensors and 4 - bilateral ankle dorsiflexors. No limb ataxia Skin: Skin iswarmand dry. Abrasions on both knees are healing. Psych: pleasant, reasonable insight    Assessment/Plan: 1. Functional deficits secondary to debility, gait disorder which require 3+ hours per day of interdisciplinary therapy in a comprehensive inpatient rehab setting.  Physiatrist is providing close team supervision and 24 hour management of  active medical problems listed below.  Physiatrist and rehab team continue to assess barriers to discharge/monitor patient progress toward functional and medical goals  Care Tool:  Bathing    Body parts bathed by patient: Right arm, Left arm, Right lower leg, Left lower leg, Face, Chest, Abdomen, Front perineal area, Right upper leg, Buttocks, Left upper leg         Bathing assist Assist Level: Minimal Assistance - Patient > 75%     Upper Body Dressing/Undressing Upper body dressing   What is the patient wearing?: Pull over shirt    Upper body assist Assist Level: Minimal Assistance - Patient > 75%    Lower Body Dressing/Undressing Lower body dressing      What is the patient wearing?: Pants, Underwear/pull up     Lower body assist Assist for lower body dressing: Minimal Assistance - Patient > 75%     Toileting Toileting    Toileting assist Assist for toileting: Independent with assistive device Assistive Device Comment: Front wheel walker   Transfers Chair/bed transfer  Transfers assist     Chair/bed transfer assist level: Minimal Assistance - Patient > 75%     Locomotion Ambulation   Ambulation assist      Assist level: Contact Guard/Touching assist Assistive device: Walker-rolling Max distance: 150'   Walk 10 feet activity   Assist     Assist level: Contact Guard/Touching assist Assistive device: Walker-rolling   Walk 50 feet activity   Assist Walk 50 feet with 2 turns activity did not occur: Safety/medical concerns  Assist level: Contact Guard/Touching assist Assistive device: Walker-rolling    Walk 150 feet activity  Assist Walk 150 feet activity did not occur: Safety/medical concerns  Assist level: Contact Guard/Touching assist Assistive device: Walker-rolling    Walk 10 feet on uneven surface  activity   Assist Walk 10 feet on uneven surfaces activity did not occur: Safety/medical concerns          Wheelchair     Assist Will patient use wheelchair at discharge?: No   Wheelchair activity did not occur: N/A         Wheelchair 50 feet with 2 turns activity    Assist    Wheelchair 50 feet with 2 turns activity did not occur: N/A       Wheelchair 150 feet activity     Assist  Wheelchair 150 feet activity did not occur: N/A       Blood pressure 138/81, pulse (!) 59, temperature 98.4 F (36.9 C), temperature source Oral, resp. rate 16, height 5\' 4"  (1.626 m), weight 93.1 kg, SpO2 97 %.  Medical Problem List and Plan: 1.  Impaired mobility and ADLs secondary to Debility and encephalopathy in a patient with Parkinson's disease with recent Covid infection.             -patient may shower             -ELOS/Goals: supervision  -Continue PT, OT, SLP. ELOS 2/24 2.  Antithrombotics: -DVT/anticoagulation:  Pharmaceutical: Heparin             -antiplatelet therapy: dc'ed ASA per pt request. See no obvious need at present 3. Pain Management: Lidocaine patch for LBP/ tylenol prn.              -well controlled  2/17: tylenol for headache  -added kpad for left hip pain  2/19-encouraged her to use tylenol if needed for pain 4. Mood: fairly up beat. Team providing ego support.            -would benefit from neuropsych follow-up. See#9. Also has anxiety and depression.   2/18 neuropsych eval pending             -antipsychotic agents: Lamictal 5. Neuropsych: This patient is not capable of making decisions on her own behalf.  -memory improving since hospital admission 6. Skin/Wound Care: Routine pressure relief measures.  7. Fluids/Electrolytes/Nutrition: encourage PO  -appetite reasonable  8. New onset seizures: On Keppra bid 9. Bipolar disorder: Last admission- Stable currently on Lamictal 100 mg/hs with Klonopin at night and prn lorazepam.  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  11.  Hypothyroidism: Now on low dose synthyroid. Repeat TSH in 4 weeks. 12. Acute on chronic renal failure: On NaHCo3 bid with calcitriol for secondary hyperparathyroidism. Hypernatremia has resolved with SCr --1.77 at baseline  -1.88 Cr 2/16.   2/19 recheck next week 13. Leucocytosis/Thrombocytopenia:  199 on 2/16  14. HTN: Amlodipine 2.5mg  daily. Flowsheet personally reviewed. BP remains uncontrolled.    -Increased Amlodipine to 5mg  daily.  2/18 bp's normalizing, more consistent. Continue to follow 15. Chronic diarrhea: Patient has had for 30 years. Currently on Imodium.  16. Increased LFT's:   2/16 slight bump from labs last week.    -lamictal can be associated with liver damage  2/19 AST and ALT  improved   -safe for tylenol prn   -recheck next week      LOS: 4 days A FACE TO FACE EVALUATION WAS PERFORMED  Meredith Staggers 04/30/2019, 10:29 AM

## 2019-04-30 NOTE — Progress Notes (Signed)
Social Work Patient ID: Tiffany [name], female   DOB: 07/15/1950, 69 y.o.   MRN: [id number]  Pt and husband aware and agreeable with targeted d/c date of 2/24 and plans for OP therapies.  Referral placed with [facility].  Loralee Pacas, LCSW-A to follow through to discharge.  Andrw Mcguirt, LCSW

## 2019-04-30 NOTE — Progress Notes (Signed)
Occupational Therapy Session Note  Patient Details  Name: Tiffany [name] MRN: [id number] Date of Birth: 1950/05/05  Today's Date: 04/30/2019 OT Individual Time: 0700-0800 OT Individual Time Calculation (min): 60 min     Short Term Goals: Week 1:  OT Short Term Goal 1 (Week 1): STG= LTG d/t ELOS  Skilled Therapeutic Interventions/Progress Updates:    1:1. Pt received in bed slowly agreeable to getting out of bed with encouragement. Pt completes all mobility with S overall and no AD. Pt with no LOB but short shuffling gait with VC for upright posture. Pt completes bathing at sit to stand at sink from w/c with S and VC for hand placement during transitional movements. Pt completes dressing with set up for UB and S for LB dressing. Pt toilets with S overall and no use of grab bars. Exited session with pt seated in w/c, breakfast set up, eixt alarm on  and call light in reach  Therapy Documentation Precautions:  Precautions Precautions: Fall Restrictions Weight Bearing Restrictions: No General:   Vital Signs: Therapy Vitals Temp: 98.4 F (36.9 C) Temp Source: Oral Pulse Rate: 61 Resp: 16 BP: 139/79 Patient Position (if appropriate): Lying Oxygen Therapy SpO2: 97 % Pain:   ADL: ADL Eating: Set up Where Assessed-Eating: Chair Grooming: Supervision/safety Where Assessed-Grooming: Sitting at sink Upper Body Bathing: Supervision/safety Where Assessed-Upper Body Bathing: Shower Lower Body Bathing: Minimal assistance Where Assessed-Lower Body Bathing: Shower Upper Body Dressing: Minimal assistance Where Assessed-Upper Body Dressing: Wheelchair Lower Body Dressing: Moderate assistance Where Assessed-Lower Body Dressing: Wheelchair Toileting: Minimal assistance Where Assessed-Toileting: Glass blower/designer: Psychiatric nurse Method: Counselling psychologist: Energy manager: Environmental education officer Method:  Heritage manager: Civil engineer, contracting with back Glass blower/designer   Exercises:   Other Treatments:     Therapy/Group: Individual Therapy  [name] 04/30/2019, 7:58 AM

## 2019-05-01 ENCOUNTER — Inpatient Hospital Stay (HOSPITAL_COMMUNITY): Payer: Medicare Other | Admitting: Speech Pathology

## 2019-05-01 ENCOUNTER — Inpatient Hospital Stay (HOSPITAL_COMMUNITY): Payer: Medicare Other

## 2019-05-01 DIAGNOSIS — R7401 Elevation of levels of liver transaminase levels: Secondary | ICD-10-CM

## 2019-05-01 DIAGNOSIS — R569 Unspecified convulsions: Secondary | ICD-10-CM

## 2019-05-01 DIAGNOSIS — I10 Essential (primary) hypertension: Secondary | ICD-10-CM

## 2019-05-01 DIAGNOSIS — N179 Acute kidney failure, unspecified: Secondary | ICD-10-CM | POA: Insufficient documentation

## 2019-05-01 DIAGNOSIS — G479 Sleep disorder, unspecified: Secondary | ICD-10-CM | POA: Diagnosis present

## 2019-05-01 NOTE — Progress Notes (Addendum)
Physical Therapy Session Note  Patient Details  Name: Tiffany [name] MRN: [id number] Date of Birth: Jun 02, 1950  Today's Date: 05/01/2019 PT Individual Time: 0900-1014 PT Individual Time Calculation (min): 74 min   Short Term Goals: Week 1:  PT Short Term Goal 1 (Week 1): =LTGs due to ELOS Week 2:    Week 3:     Skilled Therapeutic Interventions/Progress Updates:     PAIN denies pain this am  Pt initially seated on edge of bed talking on phone.  Pt agreeable to rx session focusing on gait endurance, functional gait, and balance.  Pt bed to wc SPT w/cga, cues for safety. Pt transported to gym for continued session. Gait trials as follows:  STS from wc w/cues for hand placement and for locking brakes of rollator. Gait w/Rollator x 262ft w/cga and cues to stay safe distance within walker and for increased step height due to mild shuffling tendency. Gait thru obstacle course requiring tight turns and directional changes using RW w/cga, cues as above, total distance 150ft. Gait 156ft with stops to retrieve cones from floor every 97ft, cga, pt requiring verbal cues for locking brakes and cues for process to lock brakes.    Dynamic balance - standing alternating L/R to retrieve horseshoe from chair placed to each side then tossing forward x 10total, repeated x 2 performed W/rollator w/cga to close supervision Without rollator w/cga to close supervision.  Standing no AD w/3lb bar dynamic balance challenge: Overhead press x 5  Forward press x 5 Trunk rotation w/bar x 5 Performed W/supervison no balance loss.  Gait 262ft without AD initially w/cga, w fatigue pt w/increased shuffling tendency and mild ant tendency, increased verbal and tactile cueing required for clearance and posture, rested in standing x 1 at approx 253ft.  Turn/sit to chair w/cga.  Rested several min then short distance gait to wc w/rollator as above/turn sit to wc w/cga. Pt left oob in wc w/alarm belt set and needs in  reach    Therapy Documentation Precautions:  Precautions Precautions: Fall Restrictions Weight Bearing Restrictions: No    Therapy/Group: Individual Therapy  Callie Fielding, [place] 05/01/2019, 4:29 PM

## 2019-05-01 NOTE — Progress Notes (Signed)
Occupational Therapy Session Note  Patient Details  Name: Tiffany [name] MRN: [id number] Date of Birth: 1950/07/27  Today's Date: 05/01/2019 OT Individual Time: 1400-1510 OT Individual Time Calculation (min): 70 min    Short Term Goals: Week 1:  OT Short Term Goal 1 (Week 1): STG= LTG d/t ELOS  Skilled Therapeutic Interventions/Progress Updates:    1:1. Pt with no pain throughout session, and agreeable to bathing and dressing at shower level. Pt uses rolator with supervision and mod VC for brake management and safety during functional transfers into shower/funcitonal mobility in hallway. Pt completes bathing at sit to stand level with S, dressing with set up for shirt and supervision/VC for problem solving threading LEs into pants. Pt grooms at sink seated for energy conservation. Pt completes mobility with rolatory >333ft to gift shop with no rest breaks and pt able to transfer onto restaurant booth in food court with S. Pt requires min question cues to locate external aides to pathfind back to CIR. Exited session with pt seated in bed, exit alarm on and call light in reach  Therapy Documentation Precautions:  Precautions Precautions: Fall Restrictions Weight Bearing Restrictions: No General:   Vital Signs:  Pain:   ADL: ADL Eating: Set up Where Assessed-Eating: Chair Grooming: Supervision/safety Where Assessed-Grooming: Sitting at sink Upper Body Bathing: Supervision/safety Where Assessed-Upper Body Bathing: Shower Lower Body Bathing: Minimal assistance Where Assessed-Lower Body Bathing: Shower Upper Body Dressing: Minimal assistance Where Assessed-Upper Body Dressing: Wheelchair Lower Body Dressing: Moderate assistance Where Assessed-Lower Body Dressing: Wheelchair Toileting: Minimal assistance Where Assessed-Toileting: Glass blower/designer: Psychiatric nurse Method: Counselling psychologist: Energy manager: Pension scheme manager Method: Heritage manager: Civil engineer, contracting with back Glass blower/designer   Exercises:   Other Treatments:     Therapy/Group: Individual Therapy  [name] 05/01/2019, 3:11 PM

## 2019-05-01 NOTE — Progress Notes (Signed)
Speech Language Pathology Daily Session Note  Patient Details  Name: Gerianne Simonet MRN: [id number] Date of Birth: 06/11/1950  Today's Date: 05/01/2019 SLP Individual Time: 4888-9169 SLP Individual Time Calculation (min): 30 min  Short Term Goals: Week 1: SLP Short Term Goal 1 (Week 1): STGs=LTGs due to ELOS  Skilled Therapeutic Interventions: Skilled treatment session focused on cognitive goals. SLP facilitated session by providing extra time and overall Min A verbal cues for problem solving and organization during a complex scheduling task. Patient demonstrated sustained attention to task for ~25 minutes with overall Mod I. Patient left upright in wheelchair with alarm on and all needs within reach. Continue with current plan of care.      Pain No/Denies Pain   Therapy/Group: Individual Therapy  Keylin Podolsky 05/01/2019, 12:31 PM

## 2019-05-01 NOTE — Progress Notes (Signed)
[place] PHYSICAL MEDICINE & REHABILITATION PROGRESS NOTE   Subjective/Complaints: Patient seen sitting up at the edge of her bed this morning.  Good sitting balance noted.  She states she slept fairly overnight, but does not want any additional medications to help her sleep..  ROS: Denies CP, SOB, N/V/D  Objective:   No results found. No results for input(s): WBC, HGB, HCT, PLT in the last 72 hours. No results for input(s): NA, K, CL, CO2, GLUCOSE, BUN, CREATININE, CALCIUM in the last 72 hours.  Intake/Output Summary (Last 24 hours) at 05/01/2019 1357 Last data filed at 05/01/2019 1239 Gross per 24 hour  Intake 820 ml  Output -  Net 820 ml     Physical Exam: Vital Signs Blood pressure 126/74, pulse (!) 57, temperature 98.4 F (36.9 C), temperature source Oral, resp. rate 19, height 5\' 4"  (1.626 m), weight 93.1 kg, SpO2 97 %. Constitutional: No distress . Vital signs reviewed. HENT: Normocephalic.  Atraumatic. Eyes: EOMI. No discharge. Cardiovascular: No JVD. Respiratory: Normal effort.  No stridor. GI: Non-distended. Skin: Warm and dry.  Intact. Psych: Slightly slowed Musc: No edema in extremities.  No tenderness in extremities. Neurological: Alert Motor: Grossly 4+/5 throughout  Assessment/Plan: 1. Functional deficits secondary to debility, gait disorder which require 3+ hours per day of interdisciplinary therapy in a comprehensive inpatient rehab setting.  Physiatrist is providing close team supervision and 24 hour management of active medical problems listed below.  Physiatrist and rehab team continue to assess barriers to discharge/monitor patient progress toward functional and medical goals  Care Tool:  Bathing    Body parts bathed by patient: Right arm, Left arm, Right lower leg, Left lower leg, Face, Chest, Abdomen, Front perineal area, Right upper leg, Buttocks, Left upper leg         Bathing assist Assist Level: Supervision/Verbal cueing     Upper  Body Dressing/Undressing Upper body dressing   What is the patient wearing?: Pull over shirt    Upper body assist Assist Level: Supervision/Verbal cueing    Lower Body Dressing/Undressing Lower body dressing      What is the patient wearing?: Pants, Underwear/pull up     Lower body assist Assist for lower body dressing: Supervision/Verbal cueing     Toileting Toileting    Toileting assist Assist for toileting: Independent with assistive device Assistive Device Comment: Front wheel walker   Transfers Chair/bed transfer  Transfers assist     Chair/bed transfer assist level: Minimal Assistance - Patient > 75%     Locomotion Ambulation   Ambulation assist      Assist level: Contact Guard/Touching assist Assistive device: Cane-straight Max distance: 150'   Walk 10 feet activity   Assist     Assist level: Contact Guard/Touching assist Assistive device: Rollator   Walk 50 feet activity   Assist Walk 50 feet with 2 turns activity did not occur: Safety/medical concerns  Assist level: Contact Guard/Touching assist Assistive device: Rollator    Walk 150 feet activity   Assist Walk 150 feet activity did not occur: Safety/medical concerns  Assist level: Contact Guard/Touching assist Assistive device: Rollator    Walk 10 feet on uneven surface  activity   Assist Walk 10 feet on uneven surfaces activity did not occur: Safety/medical concerns         Wheelchair     Assist Will patient use wheelchair at discharge?: No   Wheelchair activity did not occur: N/A         Wheelchair 50 feet  with 2 turns activity    Assist    Wheelchair 50 feet with 2 turns activity did not occur: N/A       Wheelchair 150 feet activity     Assist  Wheelchair 150 feet activity did not occur: N/A       Blood pressure 126/74, pulse (!) 57, temperature 98.4 F (36.9 C), temperature source Oral, resp. rate 19, height 5\' 4"  (1.626 m), weight 93.1  kg, SpO2 97 %.  Medical Problem List and Plan: 1.  Impaired mobility and ADLs secondary to Debility and encephalopathy in a patient with Parkinson's disease with recent Covid infection.  Continue CIR  2.  Antithrombotics: -DVT/anticoagulation:  Pharmaceutical: Heparin             -antiplatelet therapy: dc'ed ASA per pt request. See no obvious need at present 3. Pain Management: Lidocaine patch for LBP/ tylenol prn.   -added kpad for left hip pain  Controlled on 2/20 4. Mood: fairly up beat. Team providing ego support.            -would benefit from neuropsych follow-up. See#9. Also has anxiety and depression.   Neuropsych eval pending             -antipsychotic agents: Lamictal 5. Neuropsych: This patient is not capable of making decisions on her own behalf.  -memory improving since hospital admission 6. Skin/Wound Care: Routine pressure relief measures.  7. Fluids/Electrolytes/Nutrition: encourage PO  -appetite reasonable  8. New onset seizures: On Keppra bid  No breakthrough seizures while in rehab 9. Bipolar disorder: Last admission- Stable currently on Lamictal 100 mg/hs with Klonopin at night and prn lorazepam.  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 Hypothyroidism: Now on low dose synthyroid. Repeat TSH in 4 weeks. 12. Acute on chronic renal failure: Baseline creatinine ~1.77  On NaHCo3 bid with calcitriol for secondary hyperparathyroidism.   Creatinine 1.88 on 2/16 13. Leucocytosis/Thrombocytopenia: Resolved 14. HTN:    -Increased Amlodipine to 5mg  daily.  Controlled on 2/20 15. Chronic diarrhea: Patient has had for 30 years. Currently on Imodium.  16. Increased LFT's:   LFTs elevated, but improving on 2/19 17.  Sleep disturbance  Patient would like to hold off on medications at present   LOS: 5 days A FACE TO FACE EVALUATION WAS PERFORMED  Tiffany [name] 05/01/2019, 1:57 PM

## 2019-05-02 DIAGNOSIS — I10 Essential (primary) hypertension: Secondary | ICD-10-CM

## 2019-05-02 DIAGNOSIS — R519 Headache, unspecified: Secondary | ICD-10-CM

## 2019-05-02 NOTE — Progress Notes (Signed)
[place] PHYSICAL MEDICINE & REHABILITATION PROGRESS NOTE  Subjective/Complaints: Patient seen laying in bed this AM.  She states she slept well overnight.  She states that she had a headache overnight, but it was relieved with Tylenol.  ROS: Denies CP, SOB, N/V/D  Objective:   No results found. No results for input(s): WBC, HGB, HCT, PLT in the last 72 hours. No results for input(s): NA, K, CL, CO2, GLUCOSE, BUN, CREATININE, CALCIUM in the last 72 hours.  Intake/Output Summary (Last 24 hours) at 05/02/2019 1321 Last data filed at 05/02/2019 1300 Gross per 24 hour  Intake 560 ml  Output --  Net 560 ml     Physical Exam: Vital Signs Blood pressure 134/83, pulse 60, temperature 97.6 F (36.4 C), temperature source Oral, resp. rate 18, height 5\' 4"  (1.626 m), weight 90.7 kg, SpO2 99 %. Constitutional: No distress . Vital signs reviewed. HENT: Normocephalic.  Atraumatic. Eyes: EOMI. No discharge. Cardiovascular: No JVD. Respiratory: Normal effort.  No stridor. GI: Non-distended. Skin: Warm and dry.  Intact. Psych: Slightly delayed. Musc: No edema in extremities.  No tenderness in extremities. Neurological: Alert Motor: Grossly 4+/5 throughout, unchanged  Assessment/Plan: 1. Functional deficits secondary to debility, gait disorder which require 3+ hours per day of interdisciplinary therapy in a comprehensive inpatient rehab setting.  Physiatrist is providing close team supervision and 24 hour management of active medical problems listed below.  Physiatrist and rehab team continue to assess barriers to discharge/monitor patient progress toward functional and medical goals  Care Tool:  Bathing    Body parts bathed by patient: Right arm, Left arm, Right lower leg, Left lower leg, Face, Chest, Abdomen, Front perineal area, Right upper leg, Buttocks, Left upper leg         Bathing assist Assist Level: Supervision/Verbal cueing     Upper Body Dressing/Undressing Upper  body dressing   What is the patient wearing?: Pull over shirt    Upper body assist Assist Level: Supervision/Verbal cueing    Lower Body Dressing/Undressing Lower body dressing      What is the patient wearing?: Pants, Underwear/pull up     Lower body assist Assist for lower body dressing: Supervision/Verbal cueing     Toileting Toileting    Toileting assist Assist for toileting: Independent with assistive device Assistive Device Comment: Front wheel walker   Transfers Chair/bed transfer  Transfers assist     Chair/bed transfer assist level: Contact Guard/Touching assist     Locomotion Ambulation   Ambulation assist      Assist level: Contact Guard/Touching assist Assistive device: Rollator Max distance: 200   Walk 10 feet activity   Assist     Assist level: Contact Guard/Touching assist Assistive device: Rollator   Walk 50 feet activity   Assist Walk 50 feet with 2 turns activity did not occur: Safety/medical concerns  Assist level: Contact Guard/Touching assist Assistive device: Rollator    Walk 150 feet activity   Assist Walk 150 feet activity did not occur: Safety/medical concerns  Assist level: Contact Guard/Touching assist Assistive device: Rollator    Walk 10 feet on uneven surface  activity   Assist Walk 10 feet on uneven surfaces activity did not occur: Safety/medical concerns         Wheelchair     Assist Will patient use wheelchair at discharge?: No   Wheelchair activity did not occur: N/A         Wheelchair 50 feet with 2 turns activity    Assist  Wheelchair 50 feet with 2 turns activity did not occur: N/A       Wheelchair 150 feet activity     Assist  Wheelchair 150 feet activity did not occur: N/A       Blood pressure 134/83, pulse 60, temperature 97.6 F (36.4 C), temperature source Oral, resp. rate 18, height 5\' 4"  (1.626 m), weight 90.7 kg, SpO2 99 %.  Medical Problem List and  Plan: 1.  Impaired mobility and ADLs secondary to Debility and encephalopathy in a patient with Parkinson's disease with recent Covid infection.  Continue CIR  2.  Antithrombotics: -DVT/anticoagulation:  Pharmaceutical: Heparin             -antiplatelet therapy: dc'ed ASA per pt request. See no obvious need at present 3. Pain Management: Lidocaine patch for LBP/ tylenol prn.   -added kpad for left hip pain  Tylenol for headaches  Controlled on 2/21 4. Mood: fairly up beat. Team providing ego support.            -would benefit from neuropsych follow-up. See#9. Also has anxiety and depression.   Neuropsych eval             -antipsychotic agents: Lamictal 5. Neuropsych: This patient is not capable of making decisions on her own behalf.  -memory improving since hospital admission 6. Skin/Wound Care: Routine pressure relief measures.  7. Fluids/Electrolytes/Nutrition: encourage PO  -appetite reasonable  8. New onset seizures: On Keppra bid  No breakthrough seizures while in rehab to date. 9. Bipolar disorder: Last admission- Stable currently on Lamictal 100 mg/hs with Klonopin at night and prn lorazepam.  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 Hypothyroidism: Now on low dose synthyroid. Repeat TSH in 4 weeks. 12. Acute on chronic renal failure: Baseline creatinine ~1.77  On NaHCo3 bid with calcitriol for secondary hyperparathyroidism.   Creatinine 1.88 on 2/16 13. Leucocytosis/Thrombocytopenia: Resolved 14. HTN:    -Increased Amlodipine to 5mg  daily.  Controlled on 2/21 15. Chronic diarrhea: Patient has had for 30 years. Currently on Imodium.  16. Increased LFT's:   LFTs elevated, but improving on 2/19 17.  Sleep disturbance  Patient would like to hold off on medications at present   LOS: 6 days A FACE TO FACE EVALUATION WAS PERFORMED  Hokulani Rogel Lorie Phenix 05/02/2019, 1:21 PM

## 2019-05-03 ENCOUNTER — Inpatient Hospital Stay (HOSPITAL_COMMUNITY): Payer: Medicare Other | Admitting: Physical Therapy

## 2019-05-03 ENCOUNTER — Inpatient Hospital Stay (HOSPITAL_COMMUNITY): Payer: Medicare Other | Admitting: Speech Pathology

## 2019-05-03 ENCOUNTER — Inpatient Hospital Stay (HOSPITAL_COMMUNITY): Payer: Medicare Other

## 2019-05-03 LAB — CBC
HCT: 37.8 % (ref 36.0–46.0)
Hemoglobin: 11.7 g/dL — ABNORMAL LOW (ref 12.0–15.0)
MCH: 29.9 pg (ref 26.0–34.0)
MCHC: 31 g/dL (ref 30.0–36.0)
MCV: 96.7 fL (ref 80.0–100.0)
Platelets: 177 10*3/uL (ref 150–400)
RBC: 3.91 MIL/uL (ref 3.87–5.11)
RDW: 14.3 % (ref 11.5–15.5)
WBC: 8.3 10*3/uL (ref 4.0–10.5)
nRBC: 0 % (ref 0.0–0.2)

## 2019-05-03 LAB — BASIC METABOLIC PANEL
Anion gap: 11 (ref 5–15)
BUN: 13 mg/dL (ref 8–23)
CO2: 24 mmol/L (ref 22–32)
Calcium: 9 mg/dL (ref 8.9–10.3)
Chloride: 107 mmol/L (ref 98–111)
Creatinine, Ser: 1.86 mg/dL — ABNORMAL HIGH (ref 0.44–1.00)
GFR calc Af Amer: 31 mL/min — ABNORMAL LOW (ref >60)
GFR calc non Af Amer: 27 mL/min — ABNORMAL LOW (ref >60)
Glucose, Bld: 98 mg/dL (ref 70–99)
Potassium: 4 mmol/L (ref 3.5–5.1)
Sodium: 142 mmol/L (ref 135–145)

## 2019-05-03 NOTE — Progress Notes (Signed)
[place] PHYSICAL MEDICINE & REHABILITATION PROGRESS NOTE  Subjective/Complaints: Says she's a little down today. Realizing what's ahead of her at home. Some anxiety associated with feeling of being overwhelmed.   ROS: Patient denies fever, rash, sore throat, blurred vision, nausea, vomiting, diarrhea, cough, shortness of breath or chest pain, joint or back pain, headache,  .    Objective:   No results found. Recent Labs    05/03/19 0553  WBC 8.3  HGB 11.7*  HCT 37.8  PLT 177   Recent Labs    05/03/19 0553  NA 142  K 4.0  CL 107  CO2 24  GLUCOSE 98  BUN 13  CREATININE 1.86*  CALCIUM 9.0    Intake/Output Summary (Last 24 hours) at 05/03/2019 1052 Last data filed at 05/03/2019 0800 Gross per 24 hour  Intake 240 ml  Output --  Net 240 ml     Physical Exam: Vital Signs Blood pressure 127/86, pulse 69, temperature 97.6 F (36.4 C), temperature source Oral, resp. rate 18, height 5\' 4"  (1.626 m), weight 91.6 kg, SpO2 95 %. Constitutional: No distress . Vital signs reviewed. HEENT: EOMI, oral membranes moist Neck: supple Cardiovascular: RRR without murmur. No JVD    Respiratory: CTA Bilaterally without wheezes or rales. Normal effort    GI: BS +, non-tender, non-distended  Skin: Warm and dry.  Intact. Psych: pleasant Musc: No edema in extremities.  No tenderness in extremities. Neurological: Alert, masked facies, resting tremor Motor: Grossly 4+/5 throughout, unchanged  Assessment/Plan: 1. Functional deficits secondary to debility, gait disorder which require 3+ hours per day of interdisciplinary therapy in a comprehensive inpatient rehab setting.  Physiatrist is providing close team supervision and 24 hour management of active medical problems listed below.  Physiatrist and rehab team continue to assess barriers to discharge/monitor patient progress toward functional and medical goals  Care Tool:  Bathing    Body parts bathed by patient: Right arm, Left  arm, Right lower leg, Left lower leg, Face, Chest, Abdomen, Front perineal area, Right upper leg, Buttocks, Left upper leg         Bathing assist Assist Level: Supervision/Verbal cueing     Upper Body Dressing/Undressing Upper body dressing   What is the patient wearing?: Pull over shirt    Upper body assist Assist Level: Supervision/Verbal cueing    Lower Body Dressing/Undressing Lower body dressing      What is the patient wearing?: Pants, Underwear/pull up     Lower body assist Assist for lower body dressing: Supervision/Verbal cueing     Toileting Toileting    Toileting assist Assist for toileting: Independent with assistive device Assistive Device Comment: Front wheel walker   Transfers Chair/bed transfer  Transfers assist     Chair/bed transfer assist level: Contact Guard/Touching assist     Locomotion Ambulation   Ambulation assist      Assist level: Contact Guard/Touching assist Assistive device: Rollator Max distance: 200   Walk 10 feet activity   Assist     Assist level: Contact Guard/Touching assist Assistive device: Rollator   Walk 50 feet activity   Assist Walk 50 feet with 2 turns activity did not occur: Safety/medical concerns  Assist level: Contact Guard/Touching assist Assistive device: Rollator    Walk 150 feet activity   Assist Walk 150 feet activity did not occur: Safety/medical concerns  Assist level: Contact Guard/Touching assist Assistive device: Rollator    Walk 10 feet on uneven surface  activity   Assist Walk 10 feet on  uneven surfaces activity did not occur: Safety/medical concerns         Wheelchair     Assist Will patient use wheelchair at discharge?: No   Wheelchair activity did not occur: N/A         Wheelchair 50 feet with 2 turns activity    Assist    Wheelchair 50 feet with 2 turns activity did not occur: N/A       Wheelchair 150 feet activity     Assist  Wheelchair  150 feet activity did not occur: N/A       Blood pressure 127/86, pulse 69, temperature 97.6 F (36.4 C), temperature source Oral, resp. rate 18, height 5\' 4"  (1.626 m), weight 91.6 kg, SpO2 95 %.  Medical Problem List and Plan: 1.  Impaired mobility and ADLs secondary to Debility and encephalopathy in a patient with Parkinson's disease with recent Covid infection.  Continue CIR   ELOS 2/24 2.  Antithrombotics: -DVT/anticoagulation:  Pharmaceutical: Heparin             -antiplatelet therapy: dc'ed ASA per pt request. See no obvious need at present 3. Pain Management: Lidocaine patch for LBP/ tylenol prn.   -added kpad for left hip pain  Tylenol for headaches  Controlled on 2/22 4. Mood: fairly up beat. Team providing ego support.            -would benefit from neuropsych follow-up. See#9. Also has anxiety and depression.   Neuropsych eval             -antipsychotic agents: n/a  2/22-had discussion with her about her emotions as well as what lies ahead for her. We discussed that she has made a lot of progress but still has challenges ahead of her. Also discussed that she will have support of family, medical providers, therapists to help her after discharge. Anxiety is common when preparing to go home as well.  5. Neuropsych: This patient is not capable of making decisions on her own behalf.  -memory improving since hospital admission 6. Skin/Wound Care: Routine pressure relief measures.  7. Fluids/Electrolytes/Nutrition: encourage PO  I personally reviewed the patient's labs today.   8. New onset seizures: On Keppra bid  No breakthrough seizures while in rehab to date. 9. Bipolar disorder: Last admission- Stable currently on Lamictal 100 mg/hs with Klonopin at night and prn lorazepam.  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 Hypothyroidism: Now on low dose synthyroid. Repeat TSH in 4 weeks. 12. Acute on chronic renal failure: Baseline creatinine  ~1.77  On NaHCo3 bid with calcitriol for secondary hyperparathyroidism.   Creatinine 1.86 2/22 13. Leucocytosis/Thrombocytopenia: Resolved 14. HTN:    -Increased Amlodipine to 5mg  daily.  Controlled on 2/22 15. Chronic diarrhea: Patient has had for 30 years. Currently on Imodium.  16. Increased LFT's:   LFTs elevated, but improving on 2/19 17.  Sleep disturbance  Patient would like to hold off on medications at present   LOS: 7 days A FACE TO FACE EVALUATION WAS PERFORMED  Meredith Staggers 05/03/2019, 10:52 AM

## 2019-05-03 NOTE — Progress Notes (Signed)
Speech Language Pathology Daily Session Note  Patient Details  Name: Tiffany [name] MRN: [id number] Date of Birth: [birth year]  Today's Date: 05/03/2019 SLP Individual Time: 0800-0840 SLP Individual Time Calculation (min): 40 min  Short Term Goals: Week 1: SLP Short Term Goal 1 (Week 1): STGs=LTGs due to ELOS  Skilled Therapeutic Interventions: Skilled treatment session focused on cognitive goals. Upon arrival, patient was tearful while laying in bed. Patient reported she felt depressed, PA made aware and SLP provided emotional support. SLP facilitated session by providing trials of regular textures. Patient demonstrated efficient mastication with complete oral clearance without overt s/s of aspiration, therefore, recommend trial tray prior to upgrade. SLP also facilitated session by beginning education in regards to memory compensatory strategies and how to incorporate strategies at home. Patient left supine in bed with alarm on and all needs within reach. Continue with current plan of care.      Pain No/Denies Pain   Therapy/Group: Individual Therapy  Chimere Klingensmith 05/03/2019, 11:35 AM

## 2019-05-03 NOTE — Plan of Care (Signed)
  Problem: Consults Goal: RH GENERAL PATIENT EDUCATION Description: See Patient Education module for education specifics. Outcome: Progressing Goal: Skin Care Protocol Initiated - if Braden Score 18 or less Description: If consults are not indicated, leave blank or document N/A Outcome: Progressing   Problem: RH BOWEL ELIMINATION Goal: RH STG MANAGE BOWEL WITH ASSISTANCE Description: STG Manage Bowel with min Assistance. Outcome: Progressing   Problem: RH BLADDER ELIMINATION Goal: RH STG MANAGE BLADDER WITH ASSISTANCE Description: STG Manage Bladder With min Assistance Outcome: Progressing   Problem: RH SKIN INTEGRITY Goal: RH STG MAINTAIN SKIN INTEGRITY WITH ASSISTANCE Description: STG Maintain Skin Integrity With min Assistance. Outcome: Progressing Goal: RH STG ABLE TO PERFORM INCISION/WOUND CARE W/ASSISTANCE Description: STG Able To Perform Incision/Wound Care With min Assistance. Outcome: Progressing   Problem: RH SAFETY Goal: RH STG ADHERE TO SAFETY PRECAUTIONS W/ASSISTANCE/DEVICE Description: STG Adhere to Safety Precautions With min Assistance and appropriate assistive Device. Outcome: Progressing   Problem: RH PAIN MANAGEMENT Goal: RH STG PAIN MANAGED AT OR BELOW PT'S PAIN GOAL Description: <3 on a 0-10 pain scale Outcome: Progressing   Problem: RH KNOWLEDGE DEFICIT GENERAL Goal: RH STG INCREASE KNOWLEDGE OF SELF CARE AFTER HOSPITALIZATION Description: Patient and spouse with demonstrate knowledge of medication management, dietary management, and follow up care with the MD post discharge with min assist from staff. Outcome: Progressing

## 2019-05-03 NOTE — Progress Notes (Signed)
Active FYI dated 05/03/2019 stating pt is covid positive. I contacted infectious disease to inquire, infectious disease stated they would call back. I asked pt if she received a covid test today and pt stated they did not. I advised pt that I was still waiting for answers and would return. Infectious disease returned my call and stated that she was unaware of the active FYI and that the pt should not be put on precaution at this time. No other concerns to report.

## 2019-05-03 NOTE — Progress Notes (Signed)
Occupational Therapy Session Note  Patient Details  Name: Tiffany [name] MRN: [id number] Date of Birth: May 03, 1950  Today's Date: 05/03/2019 OT Individual Time: 7366-8159 OT Individual Time Calculation (min): 70 min    Short Term Goals: Week 1:  OT Short Term Goal 1 (Week 1): STG= LTG d/t ELOS  Skilled Therapeutic Interventions/Progress Updates:    Pt received supine in bed c/o feeling depressed. Discussed medication and participation in ADLs/IADLs and hx of depression. Pt completed sit > stand from EOB with (S) using rollator, good recall of components of transfer. Pt completed oral care and grooming tasks at the sink with (S) in standing. Pt completed community level functional mobility, over 300+ft with (S) using rollator. Discussed safety and accessibility at community level. Dual processing cognitive task completed with working/ST memory with min cueing required. Pt navigated rollator around giftshop to practice community level negotiation with no LOB. Min cueing for rollator management to use seat for rest break. Pt then completed blocked practice sit <> stand activity with graded level of UE support on compliant surface to challenge dynamic standing balance 3x 5 repetitions. Pt completed floor item retrieval with rollator with cueing for rollator management with CGA. Pt returned to her room and was left sitting up in the recliner with all needs met.    Therapy Documentation Precautions:  Precautions Precautions: Fall Restrictions Weight Bearing Restrictions: No Therapy/Group: Individual Therapy  Curtis Sites 05/03/2019, 6:44 AM

## 2019-05-03 NOTE — Progress Notes (Signed)
Physical Therapy Session Note  Patient Details  Name: Tiffany [name] MRN: [id number] Date of Birth: [birth year]  Today's Date: 05/03/2019 PT Individual Time: 2671-2458 PT Individual Time Calculation (min): 70 min   Short Term Goals: Week 1:  PT Short Term Goal 1 (Week 1): =LTGs due to ELOS  Skilled Therapeutic Interventions/Progress Updates:  Pt received in recliner & agreeable to tx. Pt voices anxiety re: cooking & things out of her control at home, relating it to PTSD. LCSW made aware & neuropsych consult requested. Therapist educated pt on need for family to assist with cooking tasks & provided pt with relaxation techniques (guided imagery, progressive muscle relaxation, distraction, and identifying 5 things related to 5 senses) with therapist providing pt with written handout. Sit<>stand from recliner, EOM with supervision with min cuing for rollator brake management. Gait room>dayroom>ortho gym>room with supervision and cuing for increased heel strike BLE as pt with shuffled gait and occasional cuing to not push AD too far out in front of her. Berg Balance Test with pt scoring [phone]; educated pt on interpretation of score & current fall risk. Patient demonstrates increased fall risk as noted by score of 43/56 on Berg Balance Scale.  (<36= high risk for falls, close to 100%; 37-45 significant >80%; 46-51 moderate >50%; 52-55 lower >25%). Discussed practicing floor transfer but pt declining 2/2 fear of activity despite education re: purpose & safety of activity. Pt completes car transfer at small SUV simulated height with supervision and step by step instructions for sequencing & rollator management. Back in room, pt's husband [name]) present & therapist educated him on pt's Berg score & current fall risk. Pt brushes teeth standing at sink with supervision. Pt left sitting on EOB with bed alarm set, call bell & all needs in reach, husband present in room.   Therapy Documentation Precautions:   Precautions Precautions: Fall Restrictions Weight Bearing Restrictions: No  Pain: Pt denies c/o pain.   Balance: Balance Balance Assessed: Yes Standardized Balance Assessment Standardized Balance Assessment: Berg Balance Test Berg Balance Test Sit to Stand: Able to stand without using hands and stabilize independently Standing Unsupported: Able to stand safely 2 minutes Sitting with Back Unsupported but Feet Supported on Floor or Stool: Able to sit safely and securely 2 minutes Stand to Sit: Sits safely with minimal use of hands Transfers: Able to transfer safely, minor use of hands Standing Unsupported with Eyes Closed: Able to stand 10 seconds safely Standing Ubsupported with Feet Together: Able to place feet together independently and stand for 1 minute with supervision From Standing, Reach Forward with Outstretched Arm: Can reach forward >12 cm safely (5") From Standing Position, Pick up Object from Floor: Able to pick up shoe, needs supervision From Standing Position, Turn to Look Behind Over each Shoulder: Looks behind one side only/other side shows less weight shift Turn 360 Degrees: Able to turn 360 degrees safely but slowly Standing Unsupported, Alternately Place Feet on Step/Stool: Able to complete 4 steps without aid or supervision Standing Unsupported, One Foot in Front: Able to take small step independently and hold 30 seconds Standing on One Leg: Tries to lift leg/unable to hold 3 seconds but remains standing independently Total Score: 43   Berg Balance Test = 33/56 on 04/28/19      Therapy/Group: Individual Therapy  Waunita Schooner 05/03/2019, 2:15 PM

## 2019-05-04 ENCOUNTER — Ambulatory Visit: Payer: Medicare Other | Admitting: Psychology

## 2019-05-04 ENCOUNTER — Encounter (HOSPITAL_COMMUNITY): Payer: Medicare Other | Admitting: Psychology

## 2019-05-04 ENCOUNTER — Inpatient Hospital Stay (HOSPITAL_COMMUNITY): Payer: Medicare Other | Admitting: Physical Therapy

## 2019-05-04 ENCOUNTER — Inpatient Hospital Stay (HOSPITAL_COMMUNITY): Payer: Medicare Other | Admitting: Speech Pathology

## 2019-05-04 ENCOUNTER — Inpatient Hospital Stay (HOSPITAL_COMMUNITY): Payer: Medicare Other

## 2019-05-04 DIAGNOSIS — F3130 Bipolar disorder, current episode depressed, mild or moderate severity, unspecified: Secondary | ICD-10-CM

## 2019-05-04 NOTE — Progress Notes (Addendum)
Physical Therapy Discharge Summary  Patient Details  Name: Tiffany [name] MRN: [id number] Date of Birth: May 19, 1950  Today's Date: 05/04/2019 PT Individual Time: 0800-0855 PT Individual Time Calculation (min): 55 min   Pt in supine and agreeable to therapy, no c/o pain. Independent bed mobility and ambulated to/from therapy gym and around unit w/ supervision using rollator, >150' at a time. No cues needed for safe management of rollator and brakes. Practiced negotiating 4 steps w/ unilateral rail as she does no believe she will be able to reach both rails at home, performed sideways w/ supervision and no cues needed for technique. Reviewed all fall risk education including Berg score, importance of rollator use, 24/7 supervision, and contributing factors to her impaired balance. Wrote down why pt's balance was impaired for her to remember 2/2 memory deficits, including decreased balance confidence, delayed reaction/response time, and reliance on UE support in standing. Reviewed HEP for balance including romberg stance w/o UE support 30 sec x5, knee marches in stance w/ LUE support only 3x10, and sit<>stands w/o UE support to stabilize 3x5 reps. Instructed pt to keep rollator in front of her and a chair behind her for safety. Provided w/ written handout of HEP and precautions. Pt demo-ed all exercises correctly and safely. Ambulated back to room, performed toilet transfer and stood at sink to wash hands and brush teeth, both w/ supervision. Ended session in w/c, all needs in reach.   Patient has met 8 of 8 long term goals due to improved activity tolerance, improved balance, improved postural control, ability to compensate for deficits, improved awareness and improved coordination.  Patient to discharge at an ambulatory level Supervision.   Patient's care partner is independent to provide the necessary physical assistance at discharge. Pt's husband as been providing up to min assist prior to admission.    Reasons goals not met: n/a  Recommendation:  Patient will benefit from ongoing skilled PT services in outpatient setting to continue to advance safe functional mobility, address ongoing impairments in balance, endurance, safety awareness, and global strength, and minimize fall risk.  Equipment: No equipment provided (has rollator already)   Reasons for discharge: treatment goals met and discharge from hospital  Patient/family agrees with progress made and goals achieved: Yes  PT Discharge Precautions/Restrictions Precautions Precautions: Fall Restrictions Weight Bearing Restrictions: No Vital Signs Therapy Vitals Pulse Rate: 63 BP: 113/61 Pain Pain Assessment Pain Scale: 0-10 Pain Score: 0-No pain Vision/Perception  Perception Perception: Within Functional Limits Praxis Praxis: Intact  Cognition Overall Cognitive Status: History of cognitive impairments - at baseline Arousal/Alertness: Awake/alert Orientation Level: Oriented X4 Attention: Sustained Sustained Attention: Appears intact Memory: Impaired Memory Impairment: Decreased recall of new information;Decreased short term memory Decreased Short Term Memory: Verbal basic;Functional basic Awareness: Appears intact Problem Solving: Impaired Problem Solving Impairment: Functional complex Safety/Judgment: Appears intact Sensation Sensation Light Touch: Appears Intact Hot/Cold: Appears Intact Proprioception: Appears Intact Stereognosis: Appears Intact Coordination Gross Motor Movements are Fluid and Coordinated: Yes Fine Motor Movements are Fluid and Coordinated: No Finger Nose Finger Test: Limited by tremors at baseline, however greatly improved as reported by family and pt Heel Shin Test: [facility] Motor  Motor Motor: Other (comment) Motor - Discharge Observations: Generalized weakness. Tremors present at baseline in BUE but pt and family report great improvement in these symptoms. Tremors increase w/ anxiety  and fatigue, primarily in jaw and distal UEs.  Mobility Bed Mobility Bed Mobility: Rolling Right;Rolling Left;Supine to Sit;Sit to Supine Rolling Right: Independent Rolling Left: Independent Supine to Sit:  Independent Sit to Supine: Independent Transfers Transfers: Sit to Stand;Stand to Sit;Stand Pivot Transfers Sit to Stand: Supervision/Verbal cueing Stand to Sit: Supervision/Verbal cueing Stand Pivot Transfers: Supervision/Verbal cueing Transfer (Assistive device): Rollator Locomotion  Gait Ambulation: Yes Gait Assistance: Supervision/Verbal cueing Gait Distance (Feet): 150 Feet Assistive device: Rollator Gait Gait: Yes Gait Pattern: Impaired Gait Pattern: Shuffle;Festinating;Poor foot clearance - right;Poor foot clearance - left;Trunk flexed(all improved since eval) Gait velocity: decreased Stairs / Additional Locomotion Stairs: Yes Stairs Assistance: Supervision/Verbal cueing Stair Management Technique: One rail Left Number of Stairs: 4 Height of Stairs: 6 Wheelchair Mobility Wheelchair Mobility: No  Trunk/Postural Assessment  Cervical Assessment Cervical Assessment: Within Functional Limits Thoracic Assessment Thoracic Assessment: Within Functional Limits Lumbar Assessment Lumbar Assessment: Within Functional Limits Postural Control Postural Control: Deficits on evaluation Righting Reactions: delayed  Balance Balance Balance Assessed: Yes Static Sitting Balance Static Sitting - Level of Assistance: 6: Modified independent (Device/Increase time) Dynamic Sitting Balance Dynamic Sitting - Level of Assistance: 6: Modified independent (Device/Increase time) Static Standing Balance Static Standing - Level of Assistance: 5: Stand by assistance Dynamic Standing Balance Dynamic Standing - Level of Assistance: 5: Stand by assistance   Berg Balance Scale on 05/03/19 - 43/56   Extremity Assessment  RUE Assessment RUE Assessment: Exceptions to [facility] General Strength  Comments: generalized weakness LUE Assessment LUE Assessment: Exceptions to [facility] General Strength Comments: generalized weakness RLE Assessment RLE Assessment: Exceptions to [facility] General Strength Comments: Hip musculature 4/5, knee and ankle 5/5 LLE Assessment LLE Assessment: Exceptions to [facility] General Strength Comments: Hip musculature 4/5, knee and ankle musculature 5/5    Jerrine Urschel K Mirenda Baltazar 05/04/2019, 9:24 AM

## 2019-05-04 NOTE — Progress Notes (Signed)
Occupational Therapy Discharge Summary  Patient Details  Name: Tiffany [name] MRN: [id number] Date of Birth: 29-Mar-1950  Today's Date: 05/04/2019 OT Individual Time: 7425-9563 OT Individual Time Calculation (min): 45 min    Patient has met 9 of 9 long term goals due to improved activity tolerance, improved balance, postural control, ability to compensate for deficits, improved attention, improved awareness and improved coordination.  Patient to discharge at overall Supervision level.  Patient's care partner is independent to provide the necessary physical and cognitive assistance at discharge. Pt's husband Tiffany [name] has been present for multiple sessions and is comfortable supervision for cognitive baseline deficits, as well as occasional physical assist related to tremors and generalized weakness.   Reasons goals not met: All treatment goals met.   Recommendation:  Patient will benefit from ongoing skilled OT services in outpatient setting to continue to advance functional skills in the area of BADL, iADL and Reduce care partner burden.  Equipment: 3 in 1 BSC  Reasons for discharge: treatment goals met and discharge from hospital  Patient/family agrees with progress made and goals achieved: Yes   Skilled OT Intervention: Pt's husband present for beginning of session and engaged in education, reviewing shower transfer and engagement in IADLs at home. Pt transferred into shower with rollator with (S). Pt completed shower level ADLs at (S) level overall, min cueing for safety awareness at times. Pt able to dress with (S) overall. Pt sat at the sink and completed hair care, using blow dryer and brushing hair with set up assist. Pt was left supine with all needs met, bed alarm set.   OT Discharge Precautions/Restrictions  Precautions Precautions: Fall Restrictions Weight Bearing Restrictions: No Pain Pain Assessment Pain Scale: 0-10 Pain Score: 0-No pain ADL ADL Eating: Set up Where  Assessed-Eating: Chair Grooming: Supervision/safety Where Assessed-Grooming: Standing at sink Upper Body Bathing: Supervision/safety Where Assessed-Upper Body Bathing: Shower Lower Body Bathing: Supervision/safety Where Assessed-Lower Body Bathing: Shower Upper Body Dressing: Supervision/safety Where Assessed-Upper Body Dressing: Sitting at sink Lower Body Dressing: Supervision/safety Where Assessed-Lower Body Dressing: Sitting at sink Toileting: Supervision/safety Where Assessed-Toileting: Glass blower/designer: Close supervision Toilet Transfer Method: Counselling psychologist: Energy manager: Close supervision Social research officer, government Method: Heritage manager: Civil engineer, contracting with back Vision Baseline Vision/History: No visual deficits Patient Visual Report: Blurring of vision Vision Assessment?: No apparent visual deficits Perception  Perception: Within Functional Limits Praxis Praxis: Intact Cognition Overall Cognitive Status: History of cognitive impairments - at baseline Arousal/Alertness: Awake/alert Orientation Level: Oriented X4 Attention: Sustained Sustained Attention: Appears intact Memory: Impaired Memory Impairment: Decreased recall of new information;Decreased short term memory Decreased Short Term Memory: Verbal basic;Functional basic Awareness: Appears intact Problem Solving: Impaired Problem Solving Impairment: Functional complex Safety/Judgment: Appears intact Sensation Sensation Light Touch: Appears Intact Hot/Cold: Appears Intact Proprioception: Appears Intact Stereognosis: Appears Intact Coordination Gross Motor Movements are Fluid and Coordinated: Yes Fine Motor Movements are Fluid and Coordinated: No Finger Nose Finger Test: Limited by tremors at baseline, however greatly improved as reported by family Motor  Motor Motor: Other (comment) Motor - Discharge Observations: Generalized weakness.  Tremors present at baseline in BUE but pt and family report great improvement in these symptoms Mobility  Bed Mobility Bed Mobility: Rolling Right;Rolling Left;Supine to Sit;Sit to Supine Rolling Right: Independent Rolling Left: Independent Supine to Sit: Independent with assistive device Sit to Supine: Independent with assistive device Transfers Sit to Stand: Supervision/Verbal cueing Stand to Sit: Supervision/Verbal cueing  Trunk/Postural Assessment  Cervical Assessment Cervical Assessment:  Within Functional Limits Thoracic Assessment Thoracic Assessment: Within Functional Limits Lumbar Assessment Lumbar Assessment: Within Functional Limits Postural Control Postural Control: Deficits on evaluation Righting Reactions: delayed  Balance Balance Balance Assessed: Yes Static Sitting Balance Static Sitting - Level of Assistance: 6: Modified independent (Device/Increase time) Dynamic Sitting Balance Dynamic Sitting - Level of Assistance: 6: Modified independent (Device/Increase time) Static Standing Balance Static Standing - Level of Assistance: 5: Stand by assistance Dynamic Standing Balance Dynamic Standing - Level of Assistance: 5: Stand by assistance Extremity/Trunk Assessment RUE Assessment RUE Assessment: Exceptions to [facility] General Strength Comments: generalized weakness LUE Assessment LUE Assessment: Exceptions to [facility] General Strength Comments: generalized weakness   Curtis Sites 05/04/2019, 7:27 AM

## 2019-05-04 NOTE — Progress Notes (Signed)
Speech Language Pathology Discharge Summary  Patient Details  Name: Tiffany [name] MRN: [id number] Date of Birth: 05/18/1950  Today's Date: 05/04/2019 SLP Individual Time: 1100-1155 SLP Individual Time Calculation (min): 55 min   Skilled Therapeutic Interventions:  Skilled treatment session focused on dysphagia and cognitive goals. SLP facilitated session by providing skilled observation with upgraded lunch meal of regular textures with thin liquids via straw. Patient demonstrated mildly prolonged mastication (baseline) with overt cough X 1, suspect due to mixed consistencies. Recommend patient upgrade to regular textures with thin liquids via straw. SLP also facilitated session by completing education with the patient and her husband in regard to patient's current cognitive functioning and strategies to utilize at home to maximize recall, attention and overall safety. Both verbalized understanding and a handout was also given to reinforce information. Patient left upright in bed with alarm on and husband present. Continue with current plan of care.   Patient has met 6 of 6 long term goals.  Patient to discharge at overall Supervision level.   Reasons goals not met: N/A   Clinical Impression/Discharge Summary: Patient has made excellent gains and has met 6 of 6 LTGs this admission. Currently, patient is consuming regular textures with thin liquids with minimal overt s/s of aspiration and overall Mod I for use of swallowing compensatory strategies. Patient also requires overall supervision-Min A verbal cues to complete functional and familiar tasks safely in regards to problem solving and recall with use of strategies. Patient and family education is complete and patient will discharge home with 24 hour supervision from family. Patient would benefit from f/u SLP services to maximize her cognitive functioning and overall functional independence.   Care Partner:  Caregiver Able to Provide Assistance: Yes      Recommendation:  24 hour supervision/assistance;Outpatient SLP  Rationale for SLP Follow Up: Maximize cognitive function and independence;Reduce caregiver burden   Equipment: N/A   Reasons for discharge: Discharged from hospital;Treatment goals met   Patient/Family Agrees with Progress Made and Goals Achieved: Yes    ,  05/04/2019, 6:27 AM    

## 2019-05-04 NOTE — Plan of Care (Signed)
  Problem: RH BLADDER ELIMINATION Goal: RH STG MANAGE BLADDER WITH ASSISTANCE Description: STG Manage Bladder With min Assistance 05/04/2019 1027 by Malachi Paradise, RN Outcome: Completed/Met 05/04/2019 1019 by Malachi Paradise, RN Outcome: Progressing   Problem: RH SKIN INTEGRITY Goal: RH STG MAINTAIN SKIN INTEGRITY WITH ASSISTANCE Description: STG Maintain Skin Integrity With min Assistance. 05/04/2019 1027 by Malachi Paradise, RN Outcome: Completed/Met 05/04/2019 1019 by Malachi Paradise, RN Outcome: Progressing Goal: RH STG ABLE TO PERFORM INCISION/WOUND CARE W/ASSISTANCE Description: STG Able To Perform Incision/Wound Care With min Assistance. 05/04/2019 1027 by Malachi Paradise, RN Outcome: Completed/Met 05/04/2019 1019 by Malachi Paradise, RN Outcome: Progressing   Problem: RH SAFETY Goal: RH STG ADHERE TO SAFETY PRECAUTIONS W/ASSISTANCE/DEVICE Description: STG Adhere to Safety Precautions With min Assistance and appropriate assistive Device. 05/04/2019 1027 by Malachi Paradise, RN Outcome: Completed/Met 05/04/2019 1019 by Malachi Paradise, RN Outcome: Progressing   Problem: RH PAIN MANAGEMENT Goal: RH STG PAIN MANAGED AT OR BELOW PT'S PAIN GOAL Description: <3 on a 0-10 pain scale 05/04/2019 1027 by Malachi Paradise, RN Outcome: Completed/Met 05/04/2019 1019 by Malachi Paradise, RN Outcome: Progressing   Problem: RH KNOWLEDGE DEFICIT GENERAL Goal: RH STG INCREASE KNOWLEDGE OF SELF CARE AFTER HOSPITALIZATION Description: Patient and spouse with demonstrate knowledge of medication management, dietary management, and follow up care with the MD post discharge with min assist from staff. 05/04/2019 1027 by Malachi Paradise, RN Outcome: Completed/Met 05/04/2019 1019 by Malachi Paradise, RN Outcome: Progressing

## 2019-05-04 NOTE — Progress Notes (Signed)
[place] PHYSICAL MEDICINE & REHABILITATION PROGRESS NOTE  Subjective/Complaints: A little anxious about going home but otherwise doing alright. Pain improved. Nursing reported a little emotional lability  ROS: Patient denies fever, rash, sore throat, blurred vision, nausea, vomiting, diarrhea, cough, shortness of breath or chest pain,   headache, or mood change.     Objective:   DG Foot Complete Left  Result Date: 05/03/2019 Please see detailed radiograph report in office note.  Recent Labs    05/03/19 0553  WBC 8.3  HGB 11.7*  HCT 37.8  PLT 177   Recent Labs    05/03/19 0553  NA 142  K 4.0  CL 107  CO2 24  GLUCOSE 98  BUN 13  CREATININE 1.86*  CALCIUM 9.0    Intake/Output Summary (Last 24 hours) at 05/04/2019 1203 Last data filed at 05/04/2019 0735 Gross per 24 hour  Intake 720 ml  Output --  Net 720 ml     Physical Exam: Vital Signs Blood pressure 113/61, pulse 63, temperature 97.6 F (36.4 C), temperature source Oral, resp. rate 18, height 5\' 4"  (1.626 m), weight 92.5 kg, SpO2 96 %. Constitutional: No distress . Vital signs reviewed. HEENT: EOMI, oral membranes moist Neck: supple Cardiovascular: RRR without murmur. No JVD    Respiratory: CTA Bilaterally without wheezes or rales. Normal effort    GI: BS +, non-tender, non-distended  Skin: Warm and dry.  Intact. Psych: pleasant Musc: No edema in extremities.  No tenderness in extremities. Neurological: Alert, masked facies, resting tremor stable Motor: Grossly 4+/5 throughout, unchanged  Assessment/Plan: 1. Functional deficits secondary to debility, gait disorder which require 3+ hours per day of interdisciplinary therapy in a comprehensive inpatient rehab setting.  Physiatrist is providing close team supervision and 24 hour management of active medical problems listed below.  Physiatrist and rehab team continue to assess barriers to discharge/monitor patient progress toward functional and medical  goals  Care Tool:  Bathing    Body parts bathed by patient: Right arm, Left arm, Right lower leg, Left lower leg, Face, Chest, Abdomen, Front perineal area, Right upper leg, Buttocks, Left upper leg         Bathing assist Assist Level: Supervision/Verbal cueing     Upper Body Dressing/Undressing Upper body dressing   What is the patient wearing?: Pull over shirt    Upper body assist Assist Level: Supervision/Verbal cueing    Lower Body Dressing/Undressing Lower body dressing      What is the patient wearing?: Pants, Underwear/pull up     Lower body assist Assist for lower body dressing: Supervision/Verbal cueing     Toileting Toileting    Toileting assist Assist for toileting: Supervision/Verbal cueing Assistive Device Comment: Front wheel walker   Transfers Chair/bed transfer  Transfers assist     Chair/bed transfer assist level: Supervision/Verbal cueing     Locomotion Ambulation   Ambulation assist      Assist level: Supervision/Verbal cueing Assistive device: Rollator Max distance: >150 ft   Walk 10 feet activity   Assist     Assist level: Supervision/Verbal cueing Assistive device: Rollator   Walk 50 feet activity   Assist Walk 50 feet with 2 turns activity did not occur: Safety/medical concerns  Assist level: Supervision/Verbal cueing Assistive device: Rollator    Walk 150 feet activity   Assist Walk 150 feet activity did not occur: Safety/medical concerns  Assist level: Supervision/Verbal cueing Assistive device: Rollator    Walk 10 feet on uneven surface  activity  Assist Walk 10 feet on uneven surfaces activity did not occur: Safety/medical concerns         Wheelchair     Assist Will patient use wheelchair at discharge?: No   Wheelchair activity did not occur: N/A         Wheelchair 50 feet with 2 turns activity    Assist    Wheelchair 50 feet with 2 turns activity did not occur: N/A        Wheelchair 150 feet activity     Assist  Wheelchair 150 feet activity did not occur: N/A       Blood pressure 113/61, pulse 63, temperature 97.6 F (36.4 C), temperature source Oral, resp. rate 18, height 5\' 4"  (1.626 m), weight 92.5 kg, SpO2 96 %.  Medical Problem List and Plan: 1.  Impaired mobility and ADLs secondary to Debility and encephalopathy in a patient with Parkinson's disease with recent Covid infection.  Continue CIR   ELOS 2/24  -Patient to see Dr. Ranell Patrick in the office for transitional care encounter in 1-2 weeks.  2.  Antithrombotics: -DVT/anticoagulation:  Pharmaceutical: Heparin             -antiplatelet therapy: dc'ed ASA per pt request. See no obvious need at present 3. Pain Management: Lidocaine patch for LBP/ tylenol prn.   -added kpad for left hip pain  Tylenol for headaches  Controlled on 2/23 4. Mood: fairly up beat             -would benefit from neuropsych follow-up. See#9. Also has anxiety and depression.   -can refer to Neuropsych as outpt as needed             -antipsychotic agents: n/a  -ongoing ego support  5. Neuropsych: This patient is not capable of making decisions on her own behalf.  -memory improving since hospital admission 6. Skin/Wound Care: Routine pressure relief measures.  7. Fluids/Electrolytes/Nutrition: encourage PO      8. New onset seizures: On Keppra bid  No breakthrough seizures while in rehab to date. 9. Bipolar disorder: Last admission- Stable currently on Lamictal 100 mg/hs with Klonopin at night and prn lorazepam.  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 Hypothyroidism: Now on low dose synthyroid. Repeat TSH in 4 weeks. 12. Acute on chronic renal failure: Baseline creatinine ~1.77  On NaHCo3 bid with calcitriol for secondary hyperparathyroidism.   Creatinine 1.86 2/22 13. Leucocytosis/Thrombocytopenia: Resolved 14. HTN:    -Increased Amlodipine to 5mg  daily.  Controlled on  2/23 15. Chronic diarrhea: Patient has had for 30 years. Currently on Imodium.  16. Increased LFT's:   LFTs elevated, but improving on 2/19 17.  Sleep disturbance  Patient would like to hold off on medications at present   LOS: 8 days A FACE TO FACE EVALUATION WAS PERFORMED  Meredith Staggers 05/04/2019, 12:03 PM

## 2019-05-04 NOTE — Progress Notes (Signed)
Physical Therapy Session Note  Patient Details  Name: Tiffany [name] MRN: [id number] Date of Birth: September 03, 1950  Today's Date: 05/04/2019 PT Individual Time: 8469-6295 PT Individual Time Calculation (min): 38 min   Short Term Goals: Week 1:  PT Short Term Goal 1 (Week 1): =LTGs due to ELOS  Skilled Therapeutic Interventions/Progress Updates:    Pt received supine in bed and agreeable to therapy session. When inquiring about discharge pt states "everyone has been very thorough..I feel very well prepared to go home." Supine>sitting EOB independently. Sit<>stands using rollator with supervision for safety and min cuing for proper management of rollator breaks. Gait training ~163ft to day room using rollator with supervision for safety - demonstrates slightly decreased B LE step length and decreased B LE foot clearance with decreased gait speed. Performed B UE and B LE reciprocal movement patterns on Nustep for cardiovascular exercise against level 4 resistance for 5 minutes totaling 285 steps - cuing to maintain moderate pace. Gait training ~264ft to main therapy gym using rollator with supervision for safety and pt demonstrating improving of the above gait impairments.  Performed the following dynamic balance tasks: - standing without UE support alternate B LE foot taps on 4" step with CGA for steayding - progressed to alternate B LE foot taps with simultaneous opposite arm shoulder flexion with CGA/min assist for balance to challenge motor planning  - in // bars with B UE support performed [place] walking with visual demonstration and max cuing for sequencing of task Gait training ~254ft back to her room using rollator with supervision. Pt left seated EOB with bed alarm on and NT arriving to assist pt with toileting.    Therapy Documentation Precautions:  Precautions Precautions: Fall Restrictions Weight Bearing Restrictions: No  Pain:   Denies pain during session.   Therapy/Group:  Individual Therapy  Tawana Scale, PT, DPT 05/04/2019, 3:48 PM

## 2019-05-04 NOTE — Progress Notes (Signed)
Social Work Patient ID: Renato Battles, female   DOB: 1950-09-04, 69 y.o.   MRN: [id number]   Previous LCSW Lennart Pall established Outpatient PT/OT/ST with [facility]. SW confirmed with [facility] [phone]) referral was received and pt scheduled on 3/9 for PT/OT/ST. D/c instructions updated. SW confirmed with pt that DME 3in1 BSC was received.   Loralee Pacas, MSW, [place] Office: [phone] Cell: [phone] Fax: [phone]

## 2019-05-04 NOTE — Consult Note (Signed)
Neuropsychological Consultation   Patient:   Tiffany [name]   DOB:   06/13/1950  MR Number:  [id number]  Location:  [place] [id number] [name] [address] Dept: [place]: [phone]           Date of Service:   05/04/2019  Start Time:   1 PM End Time:   2 PM  Provider/Observer:  Ilean Skill, Psy.D.       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  Past medications have included Prozac, atypicals, and Lithium.  Patient was admitted on 04/21/19 with a few month history of confusion, worsening of tremors and gait disturbance.  Family reports of waxing and waining of confusion that's usually worse at night and patient with fall with concerns of stroke.  Patient has past history of Lithium toxicity.  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  Initial concerns of Lithium toxicity.  Encephalopathy seen in setting of COVID with potentially extending recovery time.  Synthroid and Lamictal held with question of possible contribution to tremors/encephalopathy.  Tremors have been improving.  Patient has also had EEG showing left temporoparietal sharp waves in addition to mild to moderate diffuse encephalopathy.    Reason for Service:  Patient referred for neuropsychological consultation due to coping and adjustment issues and review cognitive functioning.  Below is the HPI for the current admission.    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  Family reported waxing and waning of confusion that's usually worse at nights and patient with fall with concerns of stroke therefore admitted for work up.  Patient moved from [place] 08/2018 (Son with [employer]) and has history of intermittent lithium toxicity.  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 Dr. Leonel Ramsay consulted and expressed concern of seizures as well as lithium toxicity. Encephalopathy seen in setting of Covid and would likely take weeks/months to recover. Lithium level-0.87 and Lamictal- 15.3. TSH-0.095/T3-2.4/T4-1.43. Synthroid and Lamictal held as question contributing to tremors/encephalopathy. Noted to have leucocytosis at admission with WBC-17.8--> BC/UA negative and acute on chronic renal failure with SCr-2.67.   LT EEG showed left temporoparietal sharp waves in addition to mild to moderate diffuse encephalopathy. Low dose Keppra added in addition to resuming lower dose Lamictal. Acute on chronic renal failure with hypernatremia has resolved with D5. As T4 levels down to 1.4-->synthroid resumed at lower dose on 2/13. Psychiatry consulted for input on medications changes to help manage anxiety and depression. Lamictal decreased to 100 mg/HS and addition of low dose Sertraline. Neurology however recommended discontinuation of latter as mentation improving and to minimize medications. To use lorazepam prn for anxiety/panic attacks. .  Tremors have improved and patient to follow up with Dr. Carles Collet after discharge. Therapy evaluations completed revealing balance deficits with significant weakness, cognitive deficits with delayed processing and  poor awareness of deficits. CIR recommended due to recent functional decline.  Current Status:  Patient and husband reports that cognition and  tremors have both improved.  Not sure what change is responsible (I.e. meds, improvements form COVID etc).  Patient reports that mood is stable without significant depressive or manic symptoms.  Continued new learning/memory issues but improving.  Tremors have significantly improved.  Were there before COVID.    Behavioral Observation: Tiffany [name]  presents as a 69 y.o.-year-old Right Caucasian Female who appeared her stated age. her dress was Appropriate and she was Well Groomed and her manners were Appropriate to the situation.  her participation was indicative of Appropriate and Redirectable behaviors.  There were any physical disabilities noted.  she displayed an appropriate level of cooperation and motivation.     Interactions:    Active Appropriate and Redirectable  Attention:   abnormal and attention span appeared shorter than expected for age  Memory:   abnormal; remote memory intact, recent memory impaired  Visuo-spatial:  not examined  Speech (Volume):  low  Speech:   normal; some word finding and fluency issues noted  Thought Process:  Coherent and Relevant  Though Content:  WNL; not suicidal and not homicidal  Orientation:   person, place, time/date and situation  Judgment:   Fair  Planning:   Poor  Affect:    Appropriate  Mood:    Anxious  Insight:   Fair  Intelligence:   high  Medical History:   Past Medical History:  Diagnosis Date  . Bipolar 1 disorder ([place])   . Breast cancer ([place])   . Chronic diarrhea    loose stools twice a day on average for years  . Chronic kidney disease (CKD), stage III (moderate)   . Depression 1987  . Parkinson's disease ([place]) 2012  . Psoriatic arthritis ([place])   . PTSD (post-traumatic stress disorder)   . Secondary hyperparathyroidism ([place])   . Tardive dyskinesia        Abuse/Trauma History: Patient with past history of PTSD, did not review today.  Psychiatric History:  Patient with long history of Bipolar 1 disorder,  depression and PTSD.  Tardive Dyskinesia and motor changes attributed to Ability in past.  Family Med/Psych History:  Family History  Problem Relation Age of Onset  . Cancer Mother   . Cancer Sister   . Stroke Maternal Grandfather   . Diabetes Paternal Grandfather   . 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  Past medications have included Prozac, atypicals, and Lithium.  Patient was admitted on 04/21/19 with a few month history of confusion, worsening of tremors and gait disturbance.  Family reports of waxing and waining of confusion that's usually worse at night and patient with fall with concerns of stroke.  Patient has past history of Lithium toxicity.  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  Initial concerns of Lithium toxicity.  Encephalopathy seen in setting of COVID with potentially extending recovery time.  Synthroid and Lamictal held with question of possible contribution to tremors/encephalopathy.  Tremors have been improving.  Patient has also had EEG showing left temporoparietal sharp waves in addition to mild to moderate diffuse encephalopathy.   Patient and husband reports that cognition and tremors  have both improved.  Not sure what change is responsible (I.e. meds, improvements form COVID etc).  Patient reports that mood is stable without significant depressive or manic symptoms.  Continued new learning/memory issues but improving.  Tremors have significantly improved.  Were there before COVID.  Disposition/Plan:  Worked on coping and patient understanding variables that could have played role in her acute exacerbation.  Diagnosis:   Holiday representative   _______________________ Ilean Skill, Psy.D.

## 2019-05-04 NOTE — Patient Care Conference (Signed)
Inpatient RehabilitationTeam Conference and Plan of Care Update Date: 05/04/2019   Time: 10:10 AM   Patient Name: Tiffany [name]      Medical Record Number: [id number]  Date of Birth: Jun 11, 1950 Sex: Female         Room/Bed: 4W25C/4W25C-01 Payor Info: Payor: MEDICARE / Plan: MEDICARE PART A AND B / Product Type: *No Product type* /    Admit Date/Time:  04/26/2019  2:42 PM  Primary Diagnosis:  Encephalopathy  Patient Active Problem List   Diagnosis Date Noted  . Bipolar I disorder, most recent episode depressed ([place])   . Benign essential HTN   . Acute nonintractable headache   . Sleep disturbance   . Transaminitis   . Essential hypertension   . AKI (acute kidney injury) ([place])   . Seizures ([place])   . Encephalopathy 04/26/2019  . Acute encephalopathy 04/21/2019  . Acute kidney injury superimposed on chronic kidney disease ([place]) 04/21/2019  . Leukocytosis 04/21/2019  . Fall at home, initial encounter 04/21/2019  . Acute metabolic encephalopathy 29/92/4268  . Anxiety and depression 04/21/2019  . COVID-19 04/21/2019  . Thrombocytopenia ([place]) 09/04/2018  . Vitamin D deficiency 09/04/2018  . Psoriatic arthritis ([place]) 09/04/2018  . History of colonic polyps 05/28/2017  . 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 Malignant neoplasm of overlapping sites of left breast in female, estrogen receptor positive ([place]) 03/14/2016  . Chronic kidney disease, stage III (moderate) 01/19/2016  . Tardive dyskinesia 10/18/2015  . HX: breast cancer 12/27/2014  . Osteopenia determined by x-ray 10/31/2014  . Rosacea 05/21/2007  . Bipolar affective disorder, mixed ([place]) 08/16/2004  . Acquired hypothyroidism 04/03/1996    Expected Discharge Date: Expected Discharge Date: 05/05/19  Team Members Present: Physician leading conference: Dr. Alger Simons Social Worker Present: Loralee Pacas, [place]) Nurse Present: Other (comment)(Susan Truman Hayward, RN) Case Manager: Karene Fry, RN PT Present: Burnard Bunting, PT OT Present: Laverle Hobby, OT SLP Present: Weston Anna, SLP PPS Coordinator present : Gunnar Fusi, SLP     Current Status/Progress Goal Weekly Team Focus  Bowel/Bladder   Pt continent of B/B, LBM 2/23  Remain continent  Assess toileting q shift and prn   Swallow/Nutrition/ Hydration   Regular textures with thin liquids, Mod i  Mod I  Family Education   ADL's   (S) UB ADLS, (S) LB, (S) transfers, improved carryover of therapy concepts  Supervision overall  ADL retraining, functional activity tolerance, rollator management, d/c planning   Mobility   supervision overall w/ rollator >150'  supervision overall  discharge planning, fall risk edu, balance, endurance   Communication             Safety/Cognition/ Behavioral Observations  Supervision-Min A  Supervision-Min A  Family education   Pain   No c/o pain  Remain pain free  Assess pain q shift and prn   Skin   Abrasions on bilateral knees  Prevent skin breakdown  Assess skin q shift and as needed    Rehab Goals Patient on target to meet rehab goals: Yes *See Care Plan and progress notes for long and short-term goals.     Barriers to Discharge  Current Status/Progress Possible Resolutions Date Resolved   Nursing                  PT                    OT  SLP                SW                Discharge Planning/Teaching Needs:  Discharge to home with her husband who will provide 24/7 care.  Family education as recommended by therapy   Team Discussion: Minor aches and pains, baseline tremor, BP improved.  RN on D3thins, bipolar outbursts, cont B/B, MD says can DC nystatin.  OT goal level S ADLs, S transfers and rollator.  PT S, amb with rollator, fam ed done.  SLP memory improved, trials of regular texture foods.   Revisions to Treatment Plan: N/A     Medical Summary Current Status: improved tremor and funcitonal mobility. bp and renal status normal. mood  more stable although anxious about dc Weekly Focus/Goal: finalize medical mgt for discharge         Continued Need for Acute Rehabilitation Level of Care: The patient requires daily medical management by a physician with specialized training in physical medicine and rehabilitation for the following reasons: Direction of a multidisciplinary physical rehabilitation program to maximize functional independence : Yes Medical management of patient stability for increased activity during participation in an intensive rehabilitation regime.: Yes Analysis of laboratory values and/or radiology reports with any subsequent need for medication adjustment and/or medical intervention. : Yes   I attest that I was present, lead the team conference, and concur with the assessment and plan of the team.   Jodell Cipro M 05/05/2019, 9:47 AM   Team conference was held via web/ teleconference due to [place] - 19

## 2019-05-05 ENCOUNTER — Encounter: Payer: Self-pay | Admitting: Neurology

## 2019-05-05 ENCOUNTER — Ambulatory Visit: Payer: Medicare Other | Admitting: Psychology

## 2019-05-05 DIAGNOSIS — K529 Noninfective gastroenteritis and colitis, unspecified: Secondary | ICD-10-CM

## 2019-05-05 MED ORDER — CHOLECALCIFEROL 50 MCG (2000 UT) PO TABS: 1.0000 | ORAL_TABLET | ORAL | 0 refills | Status: DC

## 2019-05-05 MED ORDER — SODIUM BICARBONATE 650 MG PO TABS: 650.0000 mg | ORAL_TABLET | Freq: Two times a day (BID) | ORAL | 0 refills | Status: DC

## 2019-05-05 MED ORDER — LEVOTHYROXINE SODIUM 112 MCG PO TABS: 112.0000 ug | ORAL_TABLET | Freq: Every day | ORAL | 0 refills | Status: DC

## 2019-05-05 MED ORDER — AMLODIPINE BESYLATE 2.5 MG PO TABS: 2.5000 mg | ORAL_TABLET | Freq: Every day | ORAL | 0 refills | Status: DC

## 2019-05-05 MED ORDER — LAMOTRIGINE 100 MG PO TABS: 100.0000 mg | ORAL_TABLET | Freq: Every day | ORAL | 0 refills | Status: DC

## 2019-05-05 MED ORDER — LEVETIRACETAM 250 MG PO TABS: 250.0000 mg | ORAL_TABLET | Freq: Two times a day (BID) | ORAL | 0 refills | Status: DC

## 2019-05-05 MED ORDER — LIDOCAINE 4 % EX PTCH: 1.0000 | MEDICATED_PATCH | CUTANEOUS | Status: DC

## 2019-05-05 NOTE — Progress Notes (Signed)
Social Work Discharge Note   The overall goal for the admission was met for:   Discharge location: Yes. Discharge to home  Length of Stay: Yes. 9 days  Discharge activity level: Yes. Supervision  Home/community participation: Yes. Limited.  Services provided included: MD, RD, PT, OT, SLP, RN, CM, TR, Pharmacy, Neuropsych and SW  Financial Services: Medicare  Follow-up services arranged: Outpatient: [facility] for PT/OT/ST and DME: [place] [phone]; 3in1 BSC  Comments (or additional information): Pt husband [name] primary contact [phone]  Patient/Family verbalized understanding of follow-up arrangements: Yes  Individual responsible for coordination of the follow-up plan: Pt husband and dtr to assist with coordination of pt care needs.   Confirmed correct DME delivered: Rana Snare 05/05/2019     Loralee Pacas, MSW, [place] Office: [phone] Cell: [phone] Fax: [phone]

## 2019-05-05 NOTE — Discharge Summary (Signed)
Physician Discharge Summary  Patient ID: Tiffany [name] MRN: [id number] DOB/AGE: 05-24-1950 69 y.o.  Admit date: 04/26/2019 Discharge date: 05/05/2019  Discharge Diagnoses:  Principal Problem:   Encephalopathy Active Problems:   Acquired hypothyroidism   Chronic kidney disease, stage III (moderate)   Transaminitis   Essential hypertension   Seizures (HCC)   Bipolar I disorder, most recent episode depressed (HCC)   Chronic diarrhea   Discharged Condition: Stable   Significant Diagnostic Studies:  DG Foot Complete Left  Result Date: 05/03/2019 Please see detailed radiograph report in office note.    Labs:  Basic Metabolic Panel: BMP Latest Ref Rng & Units 05/03/2019 04/27/2019 04/26/2019  Glucose 70 - 99 mg/dL 98 95 115(H)  BUN 8 - 23 mg/dL 13 10 9   Creatinine 0.44 - 1.00 mg/dL 1.86(H) 1.88(H) 1.77(H)  Sodium 135 - 145 mmol/L 142 142 144  Potassium 3.5 - 5.1 mmol/L 4.0 3.6 3.5  Chloride 98 - 111 mmol/L 107 112(H) 109  CO2 22 - 32 mmol/L 24 21(L) 23  Calcium 8.9 - 10.3 mg/dL 9.0 9.1 9.1    CBC: CBC Latest Ref Rng & Units 05/03/2019 04/27/2019 04/24/2019  WBC 4.0 - 10.5 K/uL 8.3 7.0 8.5  Hemoglobin 12.0 - 15.0 g/dL 11.7(L) 12.9 12.4  Hematocrit 36.0 - 46.0 % 37.8 41.4 39.4  Platelets 150 - 400 K/uL 177 199 191    CBG: No results for input(s): GLUCAP in the last 168 hours.  Brief HPI:   Tiffany [name] is a 69 y.o. 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  Dr. Leonel Ramsay was consulted for input and expressed concerns of seizures as well as lithium toxicity.  Lithium was discontinued.  Acute on chronic renal failure had  resolved with hydration with dextrose.  Lamictal was held due to concerns of tremors as well as encephalopathy.  Synthroid was briefly held due to TSH of 0.98 with T4 of 1.43 and then resumed at a lower dose.   LT EEG showed left temporoparietal sharp waves and low-dose Keppra was added in addition to resumption of low-dose Lamictal.  Psychiatry was consulted for input on medications to help manage anxiety and depression recommended low-dose sertraline.  Neurology however recommended discontinuation of letter as mentation was improving and in attempt to minimize medications and side effects.  Tremors had greatly improved and almost resolved.  Therapy evaluations done revealing balance deficits with significant weakness, cognitive deficits with delayed in processing as well as poor awareness of deficits.  CIR was recommended due to recent functional decline   Hospital Course: Kaliya Shreiner was admitted to rehab 04/26/2019 for inpatient therapies to consist of PT, ST and OT at least three hours five days a week. Past admission physiatrist, therapy team and rehab RN have worked together to provide customized collaborative inpatient rehab. Her blood pressures were monitored on TID basis and have been controlled.   She is tolerating current doses of Keppra and Lamictal without side effects and has been seizure-free during her stay.  Follow-up BMET shows sodium to be WNL and serum creatinine is at baseline.  Abnormal LFTs are improving.  Dr. Christian Mate was consulted for psychological support/evaluation.  Patient and husband felt that  cognition and tremors were improving and mood was stable.  He has worked with patient on coping strategies.  Issues with chronic diarrhea has been managed with as needed use of Imodium and she has follow-up with GI for work-up past discharge.  Anxiety has been managed with use of low-dose Klonopin at bedtime.  She has made gains during rehab stay and is currently at supervision level.  She  will continue to receive outpatient PT, OT and ST at [facility] after discharge.   Rehab course: During patient's stay in rehab weekly team conferences were held to monitor patient's progress, set goals and discuss barriers to discharge. At admission, patient required min assist with mobility and with ADL task.  She exhibited mild to moderate cognitive deficits affecting functional problem-solving, reasoning, recall and safety with functional and familiar tasks.  Speech was intelligible without word finding deficits.  Dysphagia 3 diet was recommended due to mild oral residue as well as prolonged dental breath mastication. She  has had improvement in activity tolerance, balance, postural control as well as ability to compensate for deficits.  She is able to complete ADL tasks with supervision. She requires supervision for transfers and is able to ambulate 150 feet with rolling walker with shuffling/festinating gait.  She is tolerating regular textures at modified independent level with use of swallow strategies.  She requires supervision to min verbal cues to complete function and familiar tasks safely and recall use of strategies for problem solving.  Family education was completed with husband regarding all aspects of safety and care.     Discharge disposition: 01-Home or Self Care  Diet: Regular  Special Instructions: 1.  No driving for 6 months until seizure-free.  Neurology cleared patient to resume driving. 2.  Recommend repeat LFTs to monitor for further improvement.    Discharge Instructions    Ambulatory referral to Neurology   Complete by: As directed    An appointment is requested in approximately: 2 weeks   Ambulatory referral to Physical Medicine Rehab   Complete by: As directed    1-2 weeks TC appt     Allergies as of 05/05/2019      Reactions   Aripiprazole Other (See Comments)   Parkinsonism     Lactose Intolerance (gi) Diarrhea   Methotrexate Other (See Comments)    Hair loss, severe stomatitis   Cefdinir Diarrhea   Other reaction(s): Diarrhea Yeast infection and fever; negative c diff   Etanercept Other (See Comments)   Headaches   Exemestane Other (See Comments)   Suicidal thoughts with medication   Fluoxetine Other (See Comments)   Parkinsonism   Methylprednisolone Sodium Succ Other (See Comments)   Agitated mania   Epinephrine Palpitations   tachycardia   Nitrofurantoin Nausea And Vomiting, Rash          Medication List    STOP taking these medications   aspirin 81 MG EC tablet   AZELAIC ACID EX   Cimzia 2 X 200 MG Kit Generic drug: Certolizumab Pegol     TAKE these medications   amLODipine 2.5 MG tablet Commonly known as: NORVASC Take 1 tablet (2.5 mg total) by mouth daily.   calcitRIOL 0.25 MCG capsule Commonly known as: ROCALTROL Take 0.25 mcg by mouth every Monday, Wednesday, and Friday.   Cholecalciferol 50 MCG (2000 UT) Tabs Take 1 tablet (2,000 Units total) by mouth See admin instructions. Take 1 tablet by mouth daily What changed:   how much to take  additional instructions  clonazePAM 0.5 MG tablet Commonly known as: KLONOPIN Take 1 tablet (0.5 mg total) by mouth at bedtime.   lamoTRIgine 100 MG tablet Commonly known as: LAMICTAL Take 1 tablet (100 mg total) by mouth at bedtime.   levETIRAcetam 250 MG tablet Commonly known as: KEPPRA Take 1 tablet (250 mg total) by mouth 2 (two) times daily.   levothyroxine 112 MCG tablet Commonly known as: SYNTHROID Take 1 tablet (112 mcg total) by mouth daily at 6 (six) AM.   Lidocaine 4 % Ptch Apply 1 patch topically See admin instructions. Can use up to 3 patches--on for 12 hours and off 12 hours. Available over the counter. What changed: additional instructions   loperamide 2 MG capsule Commonly known as: IMODIUM Take 1 capsule (2 mg total) by mouth as needed for diarrhea or loose stools.   sodium bicarbonate 650 MG tablet Take 1 tablet (650 mg total) by  mouth 2 (two) times daily.   TYLENOL 500 MG tablet Generic drug: acetaminophen Take 500 mg by mouth daily as needed for headache (pain).      Follow-up Information    Meredith Staggers, MD Follow up.   Specialty: Physical Medicine and Rehabilitation Why: Office will call you with follow up appointment Contact information: [address] [name] [address] [phone]        Tat, Eustace Quail, DO. Call on 05/06/2019.   Specialty: Neurology Why: for follow up appointment in 2 weeks Contact information: [place] [address] [phone]        Leamon Arnt, MD Follow up on 05/14/2019.   Specialty: Family Medicine Why: Have appointment at 11 am  Contact information: [place] [address] [phone]           Signed: Bary Leriche 05/06/2019, 10:10 PM

## 2019-05-05 NOTE — Progress Notes (Signed)
Provider discussed discharge instructions with patient and her husband. Patient's husband packed up her belongings. Patient was given Imodium prior to discharge per request. Patient discharged home with husband.

## 2019-05-05 NOTE — Discharge Instructions (Signed)
Inpatient Rehab Discharge Instructions  [name] Discharge date and time: 05/05/19    Activities/Precautions/ Functional Status: Activity: no lifting or strenuous exercise for till cleared by MD Diet: low fat, low cholesterol diet Wound Care: none needed   Functional status:  ___ No restrictions     ___ Walk up steps independently _X__ 24/7 supervision/assistance   ___ Walk up steps with assistance ___ Intermittent supervision/assistance  ___ Bathe/dress independently ___ Walk with walker     _X__ Bathe/dress with assistance ___ Walk Independently    ___ Shower independently ___ Walk with assistance    ___ Shower with assistance _X__ No alcohol     ___ Return to work/school ________   Special Instructions: 1. No driving till seizure free for 6 months.   Per [facility] statutes, patients with seizures are not allowed to drive until  they have been seizure-free for six months. Use caution when using heavy equipment or power tools. Avoid working on ladders or at heights. Take showers instead of baths. Ensure the water temperature is not too high on the home water heater. Do not go swimming alone. When caring for infants or small children, sit down when holding, feeding, or changing them to minimize risk of injury to the child in the event you have a seizure. Also, Maintain good sleep hygiene. Avoid alcohol.   OMMUNITY REFERRALS UPON DISCHARGE:    Outpatient: PT     OT   ST               Agency:    [facility]                            Address:   [place]                        Phone:      [phone]               Appointment Date/Time: Tuesday, May 18, 2019 at 8:45am - Physical Therapy                                                                Tuesday, May 18, 2019 at 9:30am- Occupational Therapy                                                                Tuesday, May 18, 2019 at 10:15am- Speech Therapy *Please arrive to your  appointment 15 minutes early for any new patient paperwork that needs to be completed. Please contact the outpatient center if you need to reschedule this appointment.  Medical Equipment/Items Ordered: 3-in-1 bedside commode                                                 Agency/Supplier: [place] [phone]      My questions  have been answered and I understand these instructions. I will adhere to these goals and the provided educational materials after my discharge from the hospital.  Patient/Caregiver Signature _______________________________ Date __________  Clinician Signature _______________________________________ Date __________  Please bring this form and your medication list with you to all your follow-up doctor's appointments.

## 2019-05-06 ENCOUNTER — Encounter: Payer: Self-pay | Admitting: Adult Health

## 2019-05-06 ENCOUNTER — Ambulatory Visit (INDEPENDENT_AMBULATORY_CARE_PROVIDER_SITE_OTHER): Payer: Medicare Other | Admitting: Adult Health

## 2019-05-06 ENCOUNTER — Telehealth: Payer: Self-pay | Admitting: Adult Health

## 2019-05-06 DIAGNOSIS — F411 Generalized anxiety disorder: Secondary | ICD-10-CM

## 2019-05-06 DIAGNOSIS — F319 Bipolar disorder, unspecified: Secondary | ICD-10-CM

## 2019-05-06 DIAGNOSIS — F4310 Post-traumatic stress disorder, unspecified: Secondary | ICD-10-CM

## 2019-05-06 DIAGNOSIS — G4700 Insomnia, unspecified: Secondary | ICD-10-CM | POA: Diagnosis not present

## 2019-05-06 DIAGNOSIS — F331 Major depressive disorder, recurrent, moderate: Secondary | ICD-10-CM

## 2019-05-06 MED ORDER — LORAZEPAM 1 MG PO TABS: 1.0000 mg | ORAL_TABLET | Freq: Two times a day (BID) | ORAL | 2 refills | Status: DC

## 2019-05-06 NOTE — Telephone Encounter (Signed)
Pt's husband called for [employer]. She was in the hospital for two weeks, and while there, they took her off of her psych meds.  Just got out yesterday. She is experiencing extreme anxiety and having trouble getting out of bed. Should she come in for appt today or can she restart her meds today. Really in trouble.

## 2019-05-06 NOTE — Telephone Encounter (Signed)
I will need to review hospital notes.

## 2019-05-06 NOTE — Progress Notes (Signed)
Tiffany [name] [id number] 1950/12/26 69 y.o.  Virtual Visit via Telephone Note  I connected with pt on 05/06/19 at  5:00 PM EST by telephone and verified that I am speaking with the correct person using two identifiers.   I discussed the limitations, risks, security and privacy concerns of performing an evaluation and management service by telephone and the availability of in person appointments. I also discussed with the patient that there may be a patient responsible charge related to this service. The patient expressed understanding and agreed to proceed.   I discussed the assessment and treatment plan with the patient. The patient was provided an opportunity to ask questions and all were answered. The patient agreed with the plan and demonstrated an understanding of the instructions.   The patient was advised to call back or seek an in-person evaluation if the symptoms worsen or if the condition fails to improve as anticipated.  I provided 30 minutes of non-face-to-face time during this encounter.  The patient was located at home.  The provider was located at [place].   Aloha Gell, NP   Subjective:   Patient ID:  Tiffany [name] is a 69 y.o. (DOB July 08, 1950) female.  Chief Complaint:  Chief Complaint  Patient presents with  . Anxiety  . Depression  . Insomnia  . Trauma    PTSD  . Other    BPD 1    HPI Luka Spillman presents for follow-up of PTSD, insomnia,GAD, BPD 1.   Describes mood today as "not good". Pleasant. Mood symptoms - feels depressed, anxious and irritable. More anxious overall. Recently discharged from hospital and rehab. Medications changed while hospitalized. Feel better overall and has recovered well. Is experiencing increased anxiety and inability to sleep. Took a Clonazepam as directed at bedtime, but it was not helpful. After "hours of not sleeping", she took 2 of the Lorazepam 0.5mg  tablets and was able to sleep. Is concerned that she will have  the same issues tonight and does not want to go through another "horrific night". Has been worried throughout the day and concerned that she won't be able to sleep. Improved interest and motivation. Taking medications as prescribed.  Energy levels improved since hospitalization and rehab. Stating "I feel much stronger". Active, does not have a regular exercise routine.  Enjoys some usual interests and activities. Married. Lives with husband of 23 years. Talking to sister and son.  Appetite adequate. Weight stable. Sleeps better some nights than others. Feels like Ativan is helpful and would like to continue.  Focus and concentration improved. Completing tasks. Managing aspects of household.  Denies SI or HI. Denies AH or VH.   Previous medications: Celexa, Zyprexa, Tegretol, Depakote, Serzone, Topamax, Seroquel, Effexor, Lexapro, Desipramine, Neurontin, Abilify, Geodon, Propanolol, Cymbalta, Cogentin, Trihexyphenadyl, Simet, Provigil, Selegiline, Requip, Amantadine, Prozac, Mirapex, Azilect, Metoclopramide, Baclofen, Artane, Namenda, Latuda.   Review of Systems:  Review of Systems  Musculoskeletal: Negative for gait problem.  Neurological: Negative for tremors.  Psychiatric/Behavioral:       Please refer to HPI    Medications: I have reviewed the patient's current medications.  Current Outpatient Medications  Medication Sig Dispense Refill  . acetaminophen (TYLENOL) 500 MG tablet Take 500 mg by mouth daily as needed for headache (pain).     Marland Kitchen amLODipine (NORVASC) 2.5 MG tablet Take 1 tablet (2.5 mg total) by mouth daily. 30 tablet 0  . calcitRIOL (ROCALTROL) 0.25 MCG capsule Take 0.25 mcg by mouth every Monday, Wednesday, and Friday.     Marland Kitchen  Cholecalciferol 50 MCG (2000 UT) TABS Take 1 tablet (2,000 Units total) by mouth See admin instructions. Take 1 tablet by mouth daily 30 tablet 0  . clonazePAM (KLONOPIN) 0.5 MG tablet Take 1 tablet (0.5 mg total) by mouth at bedtime. 30 tablet 0  .  lamoTRIgine (LAMICTAL) 100 MG tablet Take 1 tablet (100 mg total) by mouth at bedtime. 30 tablet 0  . levETIRAcetam (KEPPRA) 250 MG tablet Take 1 tablet (250 mg total) by mouth 2 (two) times daily. 60 tablet 0  . levothyroxine (SYNTHROID) 112 MCG tablet Take 1 tablet (112 mcg total) by mouth daily at 6 (six) AM. 30 tablet 0  . Lidocaine 4 % PTCH Apply 1 patch topically See admin instructions. Can use up to 3 patches--on for 12 hours and off 12 hours. Available over the counter.    . loperamide (IMODIUM) 2 MG capsule Take 1 capsule (2 mg total) by mouth as needed for diarrhea or loose stools. 30 capsule 0  . LORazepam (ATIVAN) 1 MG tablet Take 1 tablet (1 mg total) by mouth 2 (two) times daily. 60 tablet 2  . sodium bicarbonate 650 MG tablet Take 1 tablet (650 mg total) by mouth 2 (two) times daily. 60 tablet 0   No current facility-administered medications for this visit.    Medication Side Effects: None  Allergies:  Allergies  Allergen Reactions  . Aripiprazole Other (See Comments)    Parkinsonism     . Lactose Intolerance (Gi) Diarrhea  . Methotrexate Other (See Comments)    Hair loss, severe stomatitis    . Cefdinir Diarrhea    Other reaction(s): Diarrhea Yeast infection and fever; negative c diff  . Etanercept Other (See Comments)    Headaches    . Exemestane Other (See Comments)    Suicidal thoughts with medication    . Fluoxetine Other (See Comments)    Parkinsonism  . Methylprednisolone Sodium Succ Other (See Comments)    Agitated mania  . Epinephrine Palpitations    tachycardia   . Nitrofurantoin Nausea And Vomiting and Rash         Past Medical History:  Diagnosis Date  . Bipolar 1 disorder ([place])   . Breast cancer ([place])   . Chronic diarrhea    loose stools twice a day on average for years  . Chronic kidney disease (CKD), stage III (moderate)   . Depression 1987  . Parkinson's disease ([place]) 2012  . Psoriatic arthritis ([place])   . PTSD (post-traumatic  stress disorder)   . Secondary hyperparathyroidism ([place])   . Tardive dyskinesia     Family History  Problem Relation Age of Onset  . Cancer Mother   . Cancer Sister   . Stroke Maternal Grandfather   . Diabetes Paternal Grandfather   . Cancer Sister     Social History   Socioeconomic History  . Marital status: Married    Spouse name: Not on file  . Number of children: Not on file  . Years of education: Not on file  . Highest education level: Not on file  Occupational History  . Not on file  Tobacco Use  . Smoking status: Never Smoker  . Smokeless tobacco: Never Used  Substance and Sexual Activity  . Alcohol use: Never  . Drug use: Never  . Sexual activity: Not Currently  Other Topics Concern  . Not on file  Social History Narrative   Moved to area from [place] 08/2018   Social Determinants of Health   Financial  Resource Strain:   . Difficulty of Paying Living Expenses: Not on file  Food Insecurity:   . Worried About Charity fundraiser in the Last Year: Not on file  . Ran Out of Food in the Last Year: Not on file  Transportation Needs:   . Lack of Transportation (Medical): Not on file  . Lack of Transportation (Non-Medical): Not on file  Physical Activity:   . Days of Exercise per Week: Not on file  . Minutes of Exercise per Session: Not on file  Stress:   . Feeling of Stress : Not on file  Social Connections:   . Frequency of Communication with Friends and Family: Not on file  . Frequency of Social Gatherings with Friends and Family: Not on file  . Attends Religious Services: Not on file  . Active Member of Clubs or Organizations: Not on file  . Attends Archivist Meetings: Not on file  . Marital Status: Not on file  Intimate Partner Violence:   . Fear of Current or Ex-Partner: Not on file  . Emotionally Abused: Not on file  . Physically Abused: Not on file  . Sexually Abused: Not on file    Past Medical History, Surgical history, Social  history, and Family history were reviewed and updated as appropriate.   Please see review of systems for further details on the patient's review from today.   Objective:   Physical Exam:  There were no vitals taken for this visit.  Physical Exam Neurological:     Mental Status: She is alert and oriented to person, place, and time.     Cranial Nerves: No dysarthria.  Psychiatric:        Attention and Perception: Attention and perception normal.        Mood and Affect: Mood is anxious.        Speech: Speech normal.        Behavior: Behavior is cooperative.        Thought Content: Thought content normal. Thought content is not paranoid or delusional. Thought content does not include homicidal or suicidal ideation. Thought content does not include homicidal or suicidal plan.        Cognition and Memory: Cognition and memory normal.        Judgment: Judgment normal.     Comments: Insight intact     Lab Review:     Component Value Date/Time   NA 142 05/03/2019 0553   NA 143 09/29/2018 0000   K 4.0 05/03/2019 0553   CL 107 05/03/2019 0553   CO2 24 05/03/2019 0553   GLUCOSE 98 05/03/2019 0553   BUN 13 05/03/2019 0553   BUN 23 (A) 09/29/2018 0000   CREATININE 1.86 (H) 05/03/2019 0553   CALCIUM 9.0 05/03/2019 0553   PROT 5.9 (L) 04/30/2019 0519   ALBUMIN 3.0 (L) 04/30/2019 0519   AST 34 04/30/2019 0519   ALT 57 (H) 04/30/2019 0519   ALKPHOS 93 04/30/2019 0519   BILITOT 0.4 04/30/2019 0519   GFRNONAA 27 (L) 05/03/2019 0553   GFRAA 31 (L) 05/03/2019 0553       Component Value Date/Time   WBC 8.3 05/03/2019 0553   RBC 3.91 05/03/2019 0553   HGB 11.7 (L) 05/03/2019 0553   HCT 37.8 05/03/2019 0553   PLT 177 05/03/2019 0553   MCV 96.7 05/03/2019 0553   MCH 29.9 05/03/2019 0553   MCHC 31.0 05/03/2019 0553   RDW 14.3 05/03/2019 0553   LYMPHSABS 2.5 04/27/2019 0544  MONOABS 0.5 04/27/2019 0544   EOSABS 0.2 04/27/2019 0544   BASOSABS 0.0 04/27/2019 0544    Lithium Lvl   Date Value Ref Range Status  04/21/2019 0.79 0.60 - 1.20 mmol/L Final    Comment:    Performed at [facility], [place] [address] [place], [place] [address]     No results found for: PHENYTOIN, PHENOBARB, VALPROATE, CBMZ   .res Assessment: Plan:    Plan:  Lamictal 100mg  at hs D/C Clonazepam 1mg  at hs Increase Lorazepam 0.5mg  daily to 1mg  BID for anxiety  Therapist - [name]   RTC 4 weeks  Discussed potential benefits, risk, and side effects of benzodiazepines to include potential risk of tolerance and dependence, as well as possible drowsiness.  Advised patient not to drive if experiencing drowsiness and to take lowest possible effective dose to minimize risk of dependence and tolerance.  Counseled patient regarding potential benefits, risks, and side effects of Lamictal to include potential risk of Stevens-Johnson syndrome. Advised patient to stop taking Lamictal and contact office immediately if rash develops and to seek urgent medical attention if rash is severe and/or spreading quickly.  Guliana was seen today for anxiety, depression, insomnia, trauma and other.  Diagnoses and all orders for this visit:  PTSD (post-traumatic stress disorder)  Insomnia, unspecified type -     LORazepam (ATIVAN) 1 MG tablet; Take 1 tablet (1 mg total) by mouth 2 (two) times daily.  Generalized anxiety disorder -     LORazepam (ATIVAN) 1 MG tablet; Take 1 tablet (1 mg total) by mouth 2 (two) times daily.  Bipolar I disorder ([place])  Major depressive disorder, recurrent episode, moderate ([place])    Please see After Visit Summary for patient specific instructions.  Future Appointments  Date Time Provider [place]  05/07/2019  2:10 PM Thornton Park, MD [facility]  05/12/2019 12:00 PM Cottle, Bambi G, LCSW [facility]-GV[facility] None  05/12/2019  2:00 PM Julio Zappia, Berdie Ogren, NP CP-CP None  05/14/2019 11:00 AM Leamon Arnt, MD [facility]PC PEC  05/14/2019  1:40 PM Raulkar, Clide Deutscher, MD CPR-PRMA CPR  05/18/2019  8:45 AM Frazier Butt, PT [facility]  05/18/2019  9:30 AM Jairo Ben, Selmer Dominion, OT [facility]  05/18/2019 10:15 AM Sharen Counter, CCC-SLP [facility]-NR [facility]  05/19/2019  1:00 PM Cottle, Lucious [place]ves, LCSW [facility] None  05/26/2019  1:00 PM Cottle, Bambi G, LCSW [facility] None  06/02/2019  1:00 PM Cottle, Bambi G, LCSW [facility] None  06/07/2019  1:00 PM Cameron Sprang, MD LBN-LBNG None    No orders of the defined types were placed in this encounter.     -------------------------------

## 2019-05-07 ENCOUNTER — Encounter: Payer: Self-pay | Admitting: Gastroenterology

## 2019-05-07 ENCOUNTER — Telehealth: Payer: Self-pay | Admitting: Registered Nurse

## 2019-05-07 ENCOUNTER — Ambulatory Visit (INDEPENDENT_AMBULATORY_CARE_PROVIDER_SITE_OTHER): Payer: Medicare Other | Admitting: Gastroenterology

## 2019-05-07 ENCOUNTER — Other Ambulatory Visit: Payer: Self-pay

## 2019-05-07 VITALS — BP 118/72 | HR 84 | Temp 98.3°F | Ht 64.0 in | Wt 202.0 lb

## 2019-05-07 DIAGNOSIS — K529 Noninfective gastroenteritis and colitis, unspecified: Secondary | ICD-10-CM

## 2019-05-07 DIAGNOSIS — Z86010 Personal history of colonic polyps: Secondary | ICD-10-CM

## 2019-05-07 NOTE — Patient Instructions (Signed)
Trial of daily Metamucil for stool bulking Use Imodium each morning as needed  We will call you to schedule a colonoscopy after we get clearance from your neurologist.

## 2019-05-07 NOTE — Progress Notes (Signed)
Referring Provider: Leamon Arnt, MD Primary Care Physician:  Leamon Arnt, MD  Chief complaint: Chronic Diarrhea  IMPRESSION:  Chronic diarrhea since childhood    - normal colon biopsies 2019    - celiac testing, stool studies for fat, pancreatic elastase, giardia, culture, and O&P in 2018 History of colon polyps    - 17 tubular adenomas removed on colonoscopy 05/21/17    - surveillance recommended in 2020 Recent hospitalization for neurologic issues  Left-sided diverticulosis Lactose intolerance Tortuous sigmoid colon on prior colonoscopy History of intolerance to conscious sedation in 2004 Family history of colon polyps (sister) Cholecystectomy for cholelithiasis  History of colon polyps: She is due a colonoscopy for surveillance given the 17 tubular adenomas removed in 2019.  However, there is no urgency. Need to allow for full recovery given recent hospitalization for encephalopathy given the new diagnosis of epilepsy. Will await clearance from Neurology prior to proceeding.  We discussed that her 95 year old daughter should start colon cancer screening. Will consider referral to genetic counseling when her health stabilizes.   Diarrhea: Longstanding. No acute changes. No alarm features. Suspected functional diarrhea. I recommended that she add a daily dose of Metamucil for stool bulking properties. Use Imodium every morning, with additional doses as needed.   PLAN: Trial of daily Metamucil for stool bulking Use Imodium each morning as needed PlenVue prep as this was well tolerated in the past Zofran for nausea-induced by the prep 19 year old daughter should have a colonoscopy Consider referral to genetic counseling Consider addition of colestipol if treatment of diarrhea is requested Colonoscopy when cleared by neurology Follow-up in 6 months if we have not received neurology clearance by that time for neurology clearance, earlier with any additional concerns  I  consented the patient discussing the risks, benefits, and alternatives to endoscopic evaluation. In particular, we discussed the risks that include, but are not limited to, reaction to medication, cardiopulmonary compromise, bleeding requiring blood transfusion, aspiration resulting in pneumonia, perforation requiring surgery, lack of diagnosis, severe illness requiring hospitalization, and even death. We reviewed the risk of missed lesion including polyps or even cancer. The patient acknowledges these risks and asks that we proceed.  HPI: Tiffany [name] is a 69 y.o. female referred by Dr. Jonni Sanger last year for diarrhea.  She was seen as a virtual visit 10/02/2018.  Endoscopy was recommended but has not yet been performed.  The interval history is obtained through the patient, review of her electronic health record including [place], and her husband who accompanies her to this appointment and provides the bulk of her history. 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 She had a cholecystectomy for symptomatic cholelithiasis 40 years ago. She has a history of colon polyps.   Hospitalized earlier this month after being admitted for several months of confusion, progressive short term memory deficits and worsening tremors. encephalopathy. Ultimately thought to be complications related to Covid, Lithium toxicity, anxiety and/or epilepsy. Husband concerned about dehydration while hospitalized. Has follow-up with Dr. Ellouise Newer with Neurology in 4 weeks.   At the time of her initial consultation she reported chronic diarrhea since childhood, predating cholecystectomy. Diarrhea described as strong urgency with poorly formed stools. May have no bowel movement for 3-4 days or 3 BM a daily. Used Lomotil as a child.  Uses  Imodium as needed as an adult. Stool has been an orange color over the last year. No blood or mucous. No abdominal pain.  No identified triggers except for lactose intolerant with dairy causing diarrhea. No other associated symptoms. No identified exacerbating or relieving features.   Reports normal colonoscopy in 2006, although she was uncomfortable during the exam.   Through CareEverywhere I am able to see that she saw a gastroenterologist in 2018 and 2019 in [place] for a change in bowel habits. At that time she developed constipation relieved with MiraLAX despite normal bowel habits more like diarrhea. She had celiac testing, stool studies for fat, pancreatic elastase, giardia, culture, and O&P. The results of the tests are not available in [place]. Occult blood immunoassay stool test was positive x 2.  He recommended a trial of lactose free diet and low FODMAP diet. He recommended fiber supplements for stool bulk. Cholestyramine was considered. Colonoscopy was recommended but she was unable to complete the prep due to nausea. Ultimately had a colonoscopy 05/21/17 at [facility] with Dr. Luci Bank that revealed 17 tubular adenomas (2 transverse, 2 cecal, 5 ascending colon, 4 descending colon, 1 sigmoid, 1 rectum), severe left sided diverticulosis, tortuous sigmoid colon.  Random colon biopsies were negative at that time.  Surveillance colonoscopy recommended in 1 year.  Labs 03/20/2013 showed a TSH of 1.52  She was unable to have the colonoscopy as recommended at the time of consultation due to Covid. Now, her procedure has been delayed due to her recent hospitalization. She is concerned about the timing of her colonoscopy.   Having 2-3 BM daily. Will use Imodium as needed.  No blood or mucous in the stool.  No new complaints or concerns.   Past Medical History:  Diagnosis Date  . Bipolar 1 disorder ([place])   . Breast cancer ([place])   . Chronic diarrhea    loose stools  twice a day on average for years  . Chronic kidney disease (CKD), stage III (moderate)   . Depression 1987  . Parkinson's disease ([place]) 2012  . Psoriatic arthritis ([place])   . PTSD (post-traumatic stress disorder)   . Secondary hyperparathyroidism ([place])   . Tardive dyskinesia     Past Surgical History:  Procedure Laterality Date  . ABDOMINAL HYSTERECTOMY  1987  . CHOLECYSTECTOMY  1979  . DILATION AND CURETTAGE OF UTERUS  1973  . MASTECTOMY Bilateral 09/19/2014  . TONSILLECTOMY  1970  . URETERAL REIMPLANTION Bilateral 1974    Current Outpatient Medications  Medication Sig Dispense Refill  . acetaminophen (TYLENOL) 500 MG tablet Take 500 mg by mouth daily as needed for headache (pain).     Marland Kitchen amLODipine (NORVASC) 2.5 MG tablet Take 1 tablet (2.5 mg total) by mouth daily. 30 tablet 0  . calcitRIOL (ROCALTROL) 0.25 MCG capsule Take 0.25 mcg by mouth every Monday, Wednesday, and Friday.     . Cholecalciferol 50 MCG (2000 UT) TABS Take 1 tablet (2,000 Units total) by mouth See admin instructions. Take 1 tablet by mouth daily 30 tablet 0  . clonazePAM (KLONOPIN) 0.5 MG tablet Take 1 tablet (0.5 mg total) by mouth at bedtime. 30 tablet 0  . lamoTRIgine (LAMICTAL) 100 MG tablet Take 1 tablet (100 mg total) by mouth at bedtime. 30 tablet 0  . levETIRAcetam (KEPPRA) 250 MG tablet Take 1 tablet (250 mg total) by mouth 2 (two) times daily. 60 tablet 0  . levothyroxine (SYNTHROID) 112 MCG tablet Take 1 tablet (112 mcg total) by  mouth daily at 6 (six) AM. 30 tablet 0  . Lidocaine 4 % PTCH Apply 1 patch topically See admin instructions. Can use up to 3 patches--on for 12 hours and off 12 hours. Available over the counter.    . loperamide (IMODIUM) 2 MG capsule Take 1 capsule (2 mg total) by mouth as needed for diarrhea or loose stools. 30 capsule 0  . LORazepam (ATIVAN) 1 MG tablet Take 1 tablet (1 mg total) by mouth 2 (two) times daily. 60 tablet 2  . sodium bicarbonate 650 MG tablet Take 1 tablet  (650 mg total) by mouth 2 (two) times daily. 60 tablet 0   No current facility-administered medications for this visit.    Allergies as of 05/07/2019 - Review Complete 04/26/2019  Allergen Reaction Noted  . Aripiprazole Other (See Comments) 03/12/2017  . Lactose intolerance (gi) Diarrhea 04/26/2019  . Methotrexate Other (See Comments) 09/25/2017  . Cefdinir Diarrhea 06/19/2017  . Etanercept Other (See Comments) 11/24/2017  . Exemestane Other (See Comments) 04/03/2016  . Fluoxetine Other (See Comments) 03/12/2017  . Methylprednisolone sodium succ Other (See Comments) 10/05/2014  . Epinephrine Palpitations 10/05/2014  . Nitrofurantoin Nausea And Vomiting and Rash 08/24/2010    Family History  Problem Relation Age of Onset  . Cancer Mother   . Cancer Sister   . Stroke Maternal Grandfather   . Diabetes Paternal Grandfather   . Cancer Sister     Social History   Socioeconomic History  . Marital status: Married    Spouse name: Not on file  . Number of children: Not on file  . Years of education: Not on file  . Highest education level: Not on file  Occupational History  . Not on file  Tobacco Use  . Smoking status: Never Smoker  . Smokeless tobacco: Never Used  Substance and Sexual Activity  . Alcohol use: Never  . Drug use: Never  . Sexual activity: Not Currently  Other Topics Concern  . Not on file  Social History Narrative   Moved to area from [place] 08/2018   Social Determinants of Health   Financial Resource Strain:   . Difficulty of Paying Living Expenses: Not on file  Food Insecurity:   . Worried About Charity fundraiser in the Last Year: Not on file  . Ran Out of Food in the Last Year: Not on file  Transportation Needs:   . Lack of Transportation (Medical): Not on file  . Lack of Transportation (Non-Medical): Not on file  Physical Activity:   . Days of Exercise per Week: Not on file  . Minutes of Exercise per Session: Not on file  Stress:   .  Feeling of Stress : Not on file  Social Connections:   . Frequency of Communication with Friends and Family: Not on file  . Frequency of Social Gatherings with Friends and Family: Not on file  . Attends Religious Services: Not on file  . Active Member of Clubs or Organizations: Not on file  . Attends Archivist Meetings: Not on file  . Marital Status: Not on file  Intimate Partner Violence:   . Fear of Current or Ex-Partner: Not on file  . Emotionally Abused: Not on file  . Physically Abused: Not on file  . Sexually Abused: Not on file     Physical Exam: General: Awake, alert, and oriented, and well communicative. In no acute distress.  HEENT: EOMI, non-icteric sclera, NCAT, MMM  Neck: Normal movement of head and  neck  Pulm: No labored breathing, speaking in full sentences without conversational dyspnea  Derm: No apparent lesions or bruising in visible field  MS: Moves all visible extremities without noticeable abnormality  Psych: Pleasant, cooperative, normal speech, normal affect and normal insight Neuro: Alert and appropriate   Maansi Wike L. Tarri Glenn, MD, MPH [facility] 05/07/2019, 1:41 PM

## 2019-05-07 NOTE — Telephone Encounter (Signed)
Transitional Care call Transitional Questions Answered by [name]  Patient name: Tiffany [name]  DOB: 12-11-1950 1. Are you/is patient experiencing any problems since coming home? Yes, she was having Anxiety on 05/06/2019, she was able to video chat with her psychiatrist. Clonazepam was discontinued and Lorazepam was prescribed a. Are there any questions regarding any aspect of care? No 2. Are there any questions regarding medications administration/dosing? No a. Are meds being taken as prescribed? Yes b. "Patient should review meds with caller to confirm" Medication List Reviewed 3. Have there been any falls? No 4. Has Home Health been to the house and/or have they contacted you? She has a scheduled appointment at [facility].  a. If not, have you tried to contact them? NA b. Can we help you contact them? NA 5. Are bowels and bladder emptying properly? Yes a. Are there any unexpected incontinence issues? No b. If applicable, is patient following bowel/bladder programs? NA 6. Any fevers, problems with breathing, unexpected pain? No 7. Are there any skin problems or new areas of breakdown? No 8. Has the patient/family member arranged specialty MD follow up (ie cardiology/neurology/renal/surgical/etc.)?  HFU appointments are scheduled,  a. Can we help arrange? NA 9. Does the patient need any other services or support that we can help arrange? No 10. Are caregivers following through as expected in assisting the patient? Yes 11. Has the patient quit smoking, drinking alcohol, or using drugs as recommended? (                        )  Appointment date/time 05/14/2019  arrival time 1:20 for 1:40 appointment with Dr. Ranell Patrick. At [place]

## 2019-05-10 NOTE — Telephone Encounter (Signed)
Called and LVM for patient to return call. 

## 2019-05-10 NOTE — Telephone Encounter (Signed)
Husband,Walter, and Hala called back to report the [place] did not sleep last night; she is till very agitated - screaming, throwing things, pounding.  Took an Ativan this morning to try to get some relieve.  Please call with new ideas on how to help.

## 2019-05-11 ENCOUNTER — Telehealth: Payer: Self-pay | Admitting: Adult Health

## 2019-05-11 NOTE — Telephone Encounter (Signed)
Pt's spouse called and stated that Tiffany [name] is feeling a lot better and had a good nights rest. She plans on being here tomorrow 05/12/19 for her appt.

## 2019-05-12 ENCOUNTER — Encounter: Payer: Self-pay | Admitting: Adult Health

## 2019-05-12 ENCOUNTER — Other Ambulatory Visit: Payer: Self-pay

## 2019-05-12 ENCOUNTER — Ambulatory Visit (INDEPENDENT_AMBULATORY_CARE_PROVIDER_SITE_OTHER): Payer: Medicare Other | Admitting: Psychology

## 2019-05-12 ENCOUNTER — Ambulatory Visit (INDEPENDENT_AMBULATORY_CARE_PROVIDER_SITE_OTHER): Payer: Medicare Other | Admitting: Adult Health

## 2019-05-12 DIAGNOSIS — F331 Major depressive disorder, recurrent, moderate: Secondary | ICD-10-CM | POA: Diagnosis not present

## 2019-05-12 DIAGNOSIS — F4310 Post-traumatic stress disorder, unspecified: Secondary | ICD-10-CM | POA: Diagnosis not present

## 2019-05-12 DIAGNOSIS — F319 Bipolar disorder, unspecified: Secondary | ICD-10-CM | POA: Diagnosis not present

## 2019-05-12 DIAGNOSIS — F411 Generalized anxiety disorder: Secondary | ICD-10-CM

## 2019-05-12 DIAGNOSIS — F3181 Bipolar II disorder: Secondary | ICD-10-CM | POA: Diagnosis not present

## 2019-05-12 DIAGNOSIS — G4700 Insomnia, unspecified: Secondary | ICD-10-CM

## 2019-05-12 NOTE — Progress Notes (Signed)
Tiffany [name] [id number] October 19, 1950 69 y.o.  Subjective:   Patient ID:  Tiffany [name] is a 69 y.o. (DOB 11-12-1950) female.  Chief Complaint: No chief complaint on file.   HPI Tiffany [name] presents to the office today for follow-up of PTSD, insomnia,GAD, BPD 1.  Describes mood today as "ok". Pleasant. Mood symptoms - feels less depressed, anxious and irritable. Stating "I'm doing better". Recent hospitalization with rehabilitation. Diagnosed with Covid. Talked with therapist this morning. Has felt more anxious and irritable since hospitalization. Reports some outbursts and slamming of things. Felt like the outbursts were related to a lack of sleep. Doing better now that she is sleeping. Taking Ativan 1 mg at bedtime. Improved interest and motivation. Taking medications as prescribed.  Energy levels improving. Active, does not have a regular exercise routine. Disabled.  Enjoys some usual interests and activities. Married. Lives with husband of 74 years. Talking to family members.  Appetite adequate - no tatste. Weight loss 35 pounds while in the hospital. Sleeps better at night with increase in Ativan. Averages 6 to 8 hours with Ativan.  Focus and concentration improved. Completing tasks. Managing some aspects of household.  Denies SI or HI. Denies AH or VH.   Previous medications: Celexa, Zyprexa, Tegretol, Depakote, Serzone, Topamax, Seroquel, Effexor, Lexapro, Desipramine, Neurontin, Abilify, Geodon, Propanolol, Cymbalta, Cogentin, Trihexyphenadyl, Simet, Provigil, Selegiline, Requip, Amantadine, Prozac, Mirapex, Azilect, Metoclopramide, Baclofen, Artane, Namenda, Latuda.   Review of Systems:  Review of Systems  Musculoskeletal: Negative for gait problem.  Neurological: Negative for tremors.  Psychiatric/Behavioral:       Please refer to HPI    Medications: I have reviewed the patient's current medications.  Current Outpatient Medications  Medication Sig Dispense Refill  . acetaminophen  (TYLENOL) 500 MG tablet Take 500 mg by mouth daily as needed for headache (pain).     Marland Kitchen amLODipine (NORVASC) 2.5 MG tablet Take 1 tablet (2.5 mg total) by mouth daily. 30 tablet 0  . calcitRIOL (ROCALTROL) 0.25 MCG capsule Take 0.25 mcg by mouth every Monday, Wednesday, and Friday.     . Cholecalciferol 50 MCG (2000 UT) TABS Take 1 tablet (2,000 Units total) by mouth See admin instructions. Take 1 tablet by mouth daily 30 tablet 0  . lamoTRIgine (LAMICTAL) 100 MG tablet Take 1 tablet (100 mg total) by mouth at bedtime. 30 tablet 0  . levETIRAcetam (KEPPRA) 250 MG tablet Take 1 tablet (250 mg total) by mouth 2 (two) times daily. 60 tablet 0  . levothyroxine (SYNTHROID) 112 MCG tablet Take 1 tablet (112 mcg total) by mouth daily at 6 (six) AM. 30 tablet 0  . Lidocaine 4 % PTCH Apply 1 patch topically See admin instructions. Can use up to 3 patches--on for 12 hours and off 12 hours. Available over the counter.    . loperamide (IMODIUM) 2 MG capsule Take 1 capsule (2 mg total) by mouth as needed for diarrhea or loose stools. 30 capsule 0  . LORazepam (ATIVAN) 1 MG tablet Take 1 tablet (1 mg total) by mouth 2 (two) times daily. 60 tablet 2  . sodium bicarbonate 650 MG tablet Take 1 tablet (650 mg total) by mouth 2 (two) times daily. 60 tablet 0   No current facility-administered medications for this visit.    Medication Side Effects: None  Allergies:  Allergies  Allergen Reactions  . Aripiprazole Other (See Comments)    Parkinsonism     . Lactose Intolerance (Gi) Diarrhea  . Methotrexate Other (See Comments)    Hair  loss, severe stomatitis    . Cefdinir Diarrhea    Other reaction(s): Diarrhea Yeast infection and fever; negative c diff  . Etanercept Other (See Comments)    Headaches    . Exemestane Other (See Comments)    Suicidal thoughts with medication    . Fluoxetine Other (See Comments)    Parkinsonism  . Methylprednisolone Sodium Succ Other (See Comments)    Agitated mania   . Epinephrine Palpitations    tachycardia   . Nitrofurantoin Nausea And Vomiting and Rash         Past Medical History:  Diagnosis Date  . Bipolar 1 disorder ([place])   . Breast cancer ([place])   . Chronic diarrhea    loose stools twice a day on average for years  . Chronic kidney disease (CKD), stage III (moderate)   . Depression 1987  . Parkinson's disease ([place]) 2012  . Psoriatic arthritis ([place])   . PTSD (post-traumatic stress disorder)   . Secondary hyperparathyroidism ([place])   . Tardive dyskinesia     Family History  Problem Relation Age of Onset  . Cancer Mother   . Cancer Sister   . Stroke Maternal Grandfather   . Diabetes Paternal Grandfather   . Cancer Sister     Social History   Socioeconomic History  . Marital status: Married    Spouse name: Not on file  . Number of children: Not on file  . Years of education: Not on file  . Highest education level: Not on file  Occupational History  . Not on file  Tobacco Use  . Smoking status: Never Smoker  . Smokeless tobacco: Never Used  Substance and Sexual Activity  . Alcohol use: Never  . Drug use: Never  . Sexual activity: Not Currently  Other Topics Concern  . Not on file  Social History Narrative   Moved to area from [place] 08/2018   Social Determinants of Health   Financial Resource Strain:   . Difficulty of Paying Living Expenses: Not on file  Food Insecurity:   . Worried About Charity fundraiser in the Last Year: Not on file  . Ran Out of Food in the Last Year: Not on file  Transportation Needs:   . Lack of Transportation (Medical): Not on file  . Lack of Transportation (Non-Medical): Not on file  Physical Activity:   . Days of Exercise per Week: Not on file  . Minutes of Exercise per Session: Not on file  Stress:   . Feeling of Stress : Not on file  Social Connections:   . Frequency of Communication with Friends and Family: Not on file  . Frequency of Social Gatherings with Friends and  Family: Not on file  . Attends Religious Services: Not on file  . Active Member of Clubs or Organizations: Not on file  . Attends Archivist Meetings: Not on file  . Marital Status: Not on file  Intimate Partner Violence:   . Fear of Current or Ex-Partner: Not on file  . Emotionally Abused: Not on file  . Physically Abused: Not on file  . Sexually Abused: Not on file    Past Medical History, Surgical history, Social history, and Family history were reviewed and updated as appropriate.   Please see review of systems for further details on the patient's review from today.   Objective:   Physical Exam:  There were no vitals taken for this visit.  Physical Exam Constitutional:  General: She is not in acute distress. Musculoskeletal:        General: No deformity.  Neurological:     Mental Status: She is alert and oriented to person, place, and time.     Coordination: Coordination normal.  Psychiatric:        Attention and Perception: Attention and perception normal. She does not perceive auditory or visual hallucinations.        Mood and Affect: Mood normal. Mood is not anxious or depressed. Affect is not labile, blunt, angry or inappropriate.        Speech: Speech normal.        Behavior: Behavior normal.        Thought Content: Thought content normal. Thought content is not paranoid or delusional. Thought content does not include homicidal or suicidal ideation. Thought content does not include homicidal or suicidal plan.        Cognition and Memory: Cognition and memory normal.        Judgment: Judgment normal.     Comments: Insight intact    Lab Review:     Component Value Date/Time   NA 142 05/03/2019 0553   NA 143 09/29/2018 0000   K 4.0 05/03/2019 0553   CL 107 05/03/2019 0553   CO2 24 05/03/2019 0553   GLUCOSE 98 05/03/2019 0553   BUN 13 05/03/2019 0553   BUN 23 (A) 09/29/2018 0000   CREATININE 1.86 (H) 05/03/2019 0553   CALCIUM 9.0 05/03/2019 0553    PROT 5.9 (L) 04/30/2019 0519   ALBUMIN 3.0 (L) 04/30/2019 0519   AST 34 04/30/2019 0519   ALT 57 (H) 04/30/2019 0519   ALKPHOS 93 04/30/2019 0519   BILITOT 0.4 04/30/2019 0519   GFRNONAA 27 (L) 05/03/2019 0553   GFRAA 31 (L) 05/03/2019 0553       Component Value Date/Time   WBC 8.3 05/03/2019 0553   RBC 3.91 05/03/2019 0553   HGB 11.7 (L) 05/03/2019 0553   HCT 37.8 05/03/2019 0553   PLT 177 05/03/2019 0553   MCV 96.7 05/03/2019 0553   MCH 29.9 05/03/2019 0553   MCHC 31.0 05/03/2019 0553   RDW 14.3 05/03/2019 0553   LYMPHSABS 2.5 04/27/2019 0544   MONOABS 0.5 04/27/2019 0544   EOSABS 0.2 04/27/2019 0544   BASOSABS 0.0 04/27/2019 0544    Lithium Lvl  Date Value Ref Range Status  04/21/2019 0.79 0.60 - 1.20 mmol/L Final    Comment:    Performed at [facility], [place] [address], [place], [place] [address]     No results found for: PHENYTOIN, PHENOBARB, VALPROATE, CBMZ   .res Assessment: Plan:    Plan:  Lamictal 100mg  at hs Lorazepam 1mg  BID for anxiety  Also taking Keppra 250mg  BID for seizures  Therapist - [name]   RTC 4 weeks  Discussed potential benefits, risk, and side effects of benzodiazepines to include potential risk of tolerance and dependence, as well as possible drowsiness.  Advised patient not to drive if experiencing drowsiness and to take lowest possible effective dose to minimize risk of dependence and tolerance.  Counseled patient regarding potential benefits, risks, and side effects of Lamictal to include potential risk of Stevens-Johnson syndrome. Advised patient to stop taking Lamictal and contact office immediately if rash develops and to seek urgent medical attention if rash is severe and/or spreading quickly.  There are no diagnoses linked to this encounter.   Please see After Visit Summary for patient specific instructions.  Future Appointments  Date Time Provider [place]  05/14/2019 11:00 AM Leamon Arnt, MD  [facility] PEC  05/14/2019  1:40 PM Raulkar, Clide Deutscher, MD [facility] CPR  05/18/2019  8:45 AM Frazier Butt, PT [facility]  05/18/2019  9:30 AM Jairo Ben, Selmer Dominion, OT [facility]  05/18/2019 10:15 AM Sharen Counter, CCC-SLP [facility]  05/19/2019  1:00 PM Cottle, Lucious [place], LCSW [facility] None  05/26/2019  1:00 PM Cottle, Bambi G, LCSW [facility] None  06/02/2019  1:00 PM Cottle, Bambi G, LCSW [facility] None  06/07/2019  1:00 PM Cameron Sprang, MD [facility] None    No orders of the defined types were placed in this encounter.   -------------------------------

## 2019-05-12 NOTE — Telephone Encounter (Signed)
Noted  

## 2019-05-13 ENCOUNTER — Encounter: Payer: Medicare Other | Admitting: Occupational Therapy

## 2019-05-13 ENCOUNTER — Encounter: Payer: Medicare Other | Admitting: Speech Pathology

## 2019-05-13 ENCOUNTER — Ambulatory Visit: Payer: Medicare Other | Admitting: Physical Therapy

## 2019-05-14 ENCOUNTER — Ambulatory Visit (INDEPENDENT_AMBULATORY_CARE_PROVIDER_SITE_OTHER): Payer: Medicare Other | Admitting: Family Medicine

## 2019-05-14 ENCOUNTER — Encounter
Payer: Medicare Other | Attending: Physical Medicine and Rehabilitation | Admitting: Physical Medicine and Rehabilitation

## 2019-05-14 ENCOUNTER — Encounter: Payer: Self-pay | Admitting: Family Medicine

## 2019-05-14 ENCOUNTER — Encounter: Payer: Self-pay | Admitting: Physical Medicine and Rehabilitation

## 2019-05-14 ENCOUNTER — Other Ambulatory Visit: Payer: Self-pay

## 2019-05-14 VITALS — BP 111/73 | HR 72 | Temp 97.7°F | Ht 64.0 in | Wt 200.0 lb

## 2019-05-14 VITALS — BP 122/78 | HR 67 | Temp 98.2°F | Ht 64.0 in | Wt 202.2 lb

## 2019-05-14 DIAGNOSIS — R569 Unspecified convulsions: Secondary | ICD-10-CM | POA: Diagnosis present

## 2019-05-14 DIAGNOSIS — K529 Noninfective gastroenteritis and colitis, unspecified: Secondary | ICD-10-CM

## 2019-05-14 DIAGNOSIS — F3160 Bipolar disorder, current episode mixed, unspecified: Secondary | ICD-10-CM | POA: Diagnosis not present

## 2019-05-14 DIAGNOSIS — E039 Hypothyroidism, unspecified: Secondary | ICD-10-CM

## 2019-05-14 DIAGNOSIS — G9340 Encephalopathy, unspecified: Secondary | ICD-10-CM | POA: Diagnosis not present

## 2019-05-14 DIAGNOSIS — K5900 Constipation, unspecified: Secondary | ICD-10-CM | POA: Diagnosis present

## 2019-05-14 DIAGNOSIS — I10 Essential (primary) hypertension: Secondary | ICD-10-CM | POA: Diagnosis not present

## 2019-05-14 DIAGNOSIS — F411 Generalized anxiety disorder: Secondary | ICD-10-CM | POA: Diagnosis present

## 2019-05-14 DIAGNOSIS — G4701 Insomnia due to medical condition: Secondary | ICD-10-CM | POA: Diagnosis not present

## 2019-05-14 MED ORDER — LEVETIRACETAM 250 MG PO TABS: 250.0000 mg | ORAL_TABLET | Freq: Two times a day (BID) | ORAL | 1 refills | Status: DC

## 2019-05-14 NOTE — Patient Instructions (Signed)
Please return in 6 weeks for recheck. We will do blood work at that time.   If you have any questions or concerns, please don't hesitate to send me a message via MyChart or call the office at [phone]. Thank you for visiting with [place] today! It's our pleasure caring for you.

## 2019-05-14 NOTE — Progress Notes (Signed)
Subjective:    Patient ID: Tiffany [name], female    DOB: 02/03/1951, 69 y.o.   MRN: [id number]  HPI  Tiffany [name] presents for transitional care appointment after CIR admission for seizures.  Hospital course: Brief HPI:   Tiffany [name] is a 69 y.o. 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  Dr. Leonel Ramsay was consulted for input and expressed concerns of seizures as well as lithium toxicity.  Lithium was discontinued.  Acute on chronic renal failure had resolved with hydration with dextrose.  Lamictal was held due to concerns of tremors as well as encephalopathy.  Synthroid was briefly held due to TSH of 0.98 with T4 of 1.43 and then resumed at a lower dose.   LT EEG showed left temporoparietal sharp waves and low-dose Keppra was added in addition to resumption of low-dose Lamictal.  Psychiatry was consulted for input on medications to help manage anxiety and depression recommended low-dose sertraline.  Neurology however recommended discontinuation of letter as mentation was improving and in attempt to minimize medications and side effects.  Tremors had greatly improved and almost resolved.  Therapy evaluations done revealing balance deficits with significant weakness, cognitive deficits with delayed in processing as well as poor awareness of deficits.  CIR was recommended due to recent functional decline   Hospital Course: Tiffany [name] was admitted to rehab 04/26/2019 for inpatient therapies to consist of PT, ST and OT at least three hours five days a week. Past admission physiatrist, therapy team and rehab RN have worked together to provide customized  collaborative inpatient rehab. Her blood pressures were monitored on TID basis and have been controlled.   She is tolerating current doses of Keppra and Lamictal without side effects and has been seizure-free during her stay.  Follow-up BMET shows sodium to be WNL and serum creatinine is at baseline.  Abnormal LFTs are improving.  Dr. Christian [name] was consulted for psychological support/evaluation.  Patient and husband felt that cognition and tremors were improving and mood was stable.  He has worked with patient on coping strategies.  Issues with chronic diarrhea has been managed with as needed use of Imodium and she has follow-up with GI for work-up past discharge.  Anxiety has been managed with use of low-dose Klonopin at bedtime.  She has made gains during rehab stay and is currently at supervision level.  She will continue to receive outpatient PT, OT and ST at [facility] neuro rehab after discharge.   Rehab course: During patient's stay in rehab weekly team conferences were held to monitor patient's progress, set goals and discuss barriers to discharge. At admission, patient required min assist with mobility and with ADL task.  She exhibited mild to moderate cognitive deficits affecting functional problem-solving, reasoning, recall and safety with functional and familiar tasks.  Speech was intelligible without word finding deficits.  Dysphagia 3 diet was recommended due to mild oral residue as well as prolonged dental breath mastication. She  has had improvement in activity tolerance, balance, postural control as well as ability to compensate for deficits.  She is able to complete ADL tasks with supervision. She requires supervision for transfers and is able to ambulate 150 feet with  rolling walker with shuffling/festinating gait.  She is tolerating regular textures at modified independent level with use of swallow strategies.  She requires supervision to min verbal cues to complete function and familiar tasks  safely and recall use of strategies for problem solving.  Family education was completed with husband regarding all aspects of safety and care.     She is scheduled to start outpatient therapies and has been mobilizing well at home. She has had no falls. Has had minimal outdoor ambulation thus far.   Tremor continues to be well controlled.  All medications were reviewed and she is taking them as prescribed. She only needs refills of her Keppra. She had her PCP follow-up today and has her neurology follow-up scheduled.   She has been having anxiety and takes 1mg  of Ativan at night, also to help her sleep. She was on many more medications prior to her admission and is happy she is off these now. She does not want to add further medications unless she really needs them.   No longer having loose stools. Actually feels she may be constipated now and plans to take Metamucil tomorrow.    Pain Inventory Average Pain 5 Pain Right Now 5 My pain is dull  In the last 24 hours, has pain interfered with the following? General activity 5 Relation with others 0 Enjoyment of life 5 What TIME of day is your pain at its worst? evening Sleep (in general) Good  Pain is worse with: unsure Pain improves with: heat/ice and medication Relief from Meds: 8  Mobility walk with assistance use a walker how many minutes can you walk? 5 ability to climb steps?  yes do you drive?  no Do you have any goals in this area?  yes  Function retired I need assistance with the following:  meal prep, household duties and shopping  Neuro/Psych weakness confusion depression anxiety loss of taste or smell  Prior Studies transitional care  Physicians involved in your care transitional care   Family History  Problem Relation Age of Onset   Cancer Mother    Cancer Sister    Stroke Maternal Grandfather    Diabetes Paternal Grandfather    Cancer Sister    Social History   Socioeconomic History     Marital status: Married    Spouse name: Not on file   Number of children: Not on file   Years of education: Not on file   Highest education level: Not on file  Occupational History   Not on file  Tobacco Use   Smoking status: Never Smoker   Smokeless tobacco: Never Used  Substance and Sexual Activity   Alcohol use: Never   Drug use: Never   Sexual activity: Not Currently  Other Topics Concern   Not on file  Social History Narrative   Moved to area from [place] 08/2018   Social Determinants of Health   Financial Resource Strain:    Difficulty of Paying Living Expenses: Not on file  Food Insecurity:    Worried About Charity fundraiser in the Last Year: Not on file   [employer] of Food in the Last Year: Not on file  Transportation Needs:    Lack of Transportation (Medical): Not on file   Lack of Transportation (Non-Medical): Not on file  Physical Activity:    Days of Exercise per Week: Not on file   Minutes of Exercise per Session: Not on file  Stress:    Feeling of Stress :  Not on file  Social Connections:    Frequency of Communication with Friends and Family: Not on file   Frequency of Social Gatherings with Friends and Family: Not on file   Attends Religious Services: Not on file   Active Member of Clubs or Organizations: Not on file   Attends Archivist Meetings: Not on file   Marital Status: Not on file   Past Surgical History:  Procedure Laterality Date   [place] Bilateral 09/19/2014   [place] Bilateral 1974   Past Medical History:  Diagnosis Date   Bipolar 1 disorder ([place])    Breast cancer ([place])    Chronic diarrhea    loose stools twice a day on average for years   Chronic kidney disease (CKD), stage III (moderate)    Depression 1987   Malignant neoplasm of overlapping  sites of left breast in female, estrogen receptor positive ([place]) 03/14/2016   Dx in 09/2014, s/p bilateral mastectomies and ALND, 0/10 LN. 1.4 cm  Grade I invasive lobular, ER and PR +/Her--, Ki 67 <5% Tried anastrozole for one month, but developed suicidal idea   Parkinson's disease ([place]) 2012   Psoriatic arthritis ([place])    PTSD (post-traumatic stress disorder)    Secondary hyperparathyroidism (H[facility])    Tardive dyskinesia    Temp 97.7 F (36.5 C)    Ht 5\' 4"  (1.626 m)    Wt 200 lb (90.7 kg)    BMI 34.33 kg/m   Opioid Risk Score:   Fall Risk Score:  `1  Depression screen PHQ 2/9  Depression screen PHQ 2/9 05/14/2019  Decreased Interest 0  Down, Depressed, Hopeless 1  PHQ - 2 Score 1  Altered sleeping 1  Tired, decreased energy 2  Change in appetite 1  Feeling bad or failure about yourself  2  Trouble concentrating 1  Moving slowly or fidgety/restless 0  Suicidal thoughts 0  PHQ-9 Score 8  Difficult doing work/chores Somewhat difficult     Review of Systems  Constitutional: Negative.   HENT: Negative.   Eyes: Negative.   Respiratory: Negative.   Cardiovascular: Negative.   Gastrointestinal: Positive for constipation and diarrhea.  Endocrine: Negative.   Genitourinary: Negative.   Musculoskeletal: Positive for arthralgias and gait problem.  Skin: Negative.   Allergic/Immunologic: Negative.   Neurological: Positive for weakness.  Hematological: Negative.   Psychiatric/Behavioral: Positive for confusion and dysphoric mood. The patient is nervous/anxious.        Objective:   Physical Exam Constitutional: No distress . Vital signs reviewed. HEENT: EOMI, oral membranes moist Neck: supple Cardiovascular: RRR without murmur. No JVD    Respiratory: CTA Bilaterally without wheezes or rales. Normal effort    GI: BS +, non-tender, non-distended  Skin: Warm and dry.  Intact. Psych: pleasant Musc: No edema in extremities.  No tenderness in extremities. Neurological:  Alert, masked facies, resting tremor stable Motor: Grossly 5/5 throughout. Ambulating well with walker.        Assessment & Plan:  . Impaired mobility and ADLs secondary to Debility and encephalopathy in a patient with Parkinson's disease with recent Covid infection.             Continue outpatient therapy. Advised that she start walking for 5 minutes daily outside as well for exercise. Should walk with her husband with RW for support, and slowly increase walking  regimen as tolerated.               2. Pain Management: Left hip pain: lidocaine patch no longer helping. Advised heat or ice and tylenol as needed. Tender in greater trochanteric bursa and discussed could benefit from greater trochanter injection in future if pain worsens.  3. Anxiety: Discussed that Ativan should be used more for acute anxiety and that there are other medications that are more ideal for daily anxiety as Ativan causes dependency and increases fall risk. Advised that if she wants to titrate off she should decease dose to 0.5 mg per night and then after a few nights stop taking the medication. Advised there are other safe sleeping aides or strategies we can provide to her.  4. Constipation: Advised prune juice and high fiber foods such as oatmeal and berries to allow her to have daily BM without causing loose stool again.   5. New onset seizures: On Keppra bid. Provided refill of medication. She has enough of all other medications.  All questions answered. RTC PRN.

## 2019-05-14 NOTE — Progress Notes (Signed)
Subjective  CC:  Chief Complaint  Patient presents with  . Annual Exam    not fasting  . Hypertension    does not check readings at home. No HA, dizziness, or visual changes  . Depression    still not better  . Anxiety    symptoms are worse  . Seizures    seen in ED on 04/26/2019. will f/u with neurology    HPI: Tiffany [name] is a 69 y.o. female who presents to the office today to address the problems listed above in the chief complaint. Hospital follow up and annual exam. I've personally reviewed recent office visit notes, hospital notes, associated labs and imaging reports and/or pertinent outside office records via chart review or CareEverywhere. Briefly, covid infection hospitalized and then readmitted due to encephalopathy: bipolar, possible lithium toxicity, new dx seizures, polypharmacy were the main findings. She was discharged to rehab and then eventually home. Seeing behavioral health,and neurology in follow up  She is here with her husband; both agree she is improving. Memory is improved. Speech is back to baseline. Having emotional lability. Working with PT and is getting stronger. Tolerating her medications. Seeing psych as well.  HTN: stable    Assessment  1. Bipolar affective disorder, mixed ([place])   2. Essential hypertension   3. Encephalopathy   4. Seizures ([place])   5. Acquired hypothyroidism   6. Chronic diarrhea      Plan   HTN :  Controlled on low dose amlodipine  Psych: on lamictal. No longer on lithium.   Encephalopathy: clearing  Seizure d/o on keppra and seeing neurology  Low thyroid on appropriate supplementation  Diarrhea: seeing GI  Follow up: 3 months recheck  Visit date not found  No orders of the defined types were placed in this encounter.  No orders of the defined types were placed in this encounter.     I reviewed the patients updated PMH, FH, and SocHx.    Patient Active Problem List   Diagnosis Date Noted  . Chronic diarrhea  05/05/2019    Priority: High  . Essential hypertension     Priority: High  . Seizures ([place])     Priority: High  . Encephalopathy 04/26/2019    Priority: High  . Tubular adenoma of colon 05/28/2017    Priority: High  . Chronic kidney disease, stage III (moderate) 01/19/2016    Priority: High  . Tardive dyskinesia 10/18/2015    Priority: High  . Bipolar affective disorder, mixed ([place]) 08/16/2004    Priority: High  . Acquired hypothyroidism 04/03/1996    Priority: High  . Thrombocytopenia ([place]) 09/04/2018    Priority: Medium  . History of breast cancer left 2016 12/27/2014    Priority: Medium  . Vitamin D deficiency 09/04/2018    Priority: Low  . Psoriatic arthritis ([place]) 09/04/2018    Priority: Low  . 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 Osteopenia determined by x-ray 10/31/2014    Priority: Low  . Rosacea 05/21/2007    Priority: Low  . Bipolar I disorder, most recent episode depressed ([place])   . Transaminitis   . Leukocytosis 04/21/2019   Current Meds  Medication Sig  . acetaminophen (TYLENOL) 500 MG tablet Take 500 mg by mouth daily as needed for headache (pain).   Marland Kitchen amLODipine (NORVASC) 2.5 MG tablet Take 1 tablet (2.5 mg total) by mouth daily.  . calcitRIOL (ROCALTROL) 0.25 MCG capsule Take 0.25 mcg  by mouth every Monday, Wednesday, and Friday.   . Cholecalciferol 50 MCG (2000 UT) TABS Take 1 tablet (2,000 Units total) by mouth See admin instructions. Take 1 tablet by mouth daily  . lamoTRIgine (LAMICTAL) 100 MG tablet Take 1 tablet (100 mg total) by mouth at bedtime.  . levETIRAcetam (KEPPRA) 250 MG tablet Take 1 tablet (250 mg total) by mouth 2 (two) times daily.  Marland Kitchen levothyroxine (SYNTHROID) 112 MCG tablet Take 1 tablet (112 mcg total) by mouth daily at 6 (six) AM.  . loperamide (IMODIUM) 2 MG capsule Take 1 capsule (2 mg total) by mouth as needed for diarrhea or loose stools.  Marland Kitchen LORazepam (ATIVAN) 1 MG  tablet Take 1 tablet (1 mg total) by mouth 2 (two) times daily.  . sodium bicarbonate 650 MG tablet Take 1 tablet (650 mg total) by mouth 2 (two) times daily.    Allergies: Patient is allergic to aripiprazole; lactose intolerance (gi); methotrexate; cefdinir; etanercept; exemestane; fluoxetine; methylprednisolone sodium succ; epinephrine; and nitrofurantoin. Family History: Patient family history includes Cancer in her mother, sister, and sister; Diabetes in her paternal grandfather; Stroke in her maternal grandfather. Social History:  Patient  reports that she has never smoked. She has never used smokeless tobacco. She reports that she does not drink alcohol or use drugs.  Review of Systems: Constitutional: Negative for fever malaise or anorexia Cardiovascular: negative for chest pain Respiratory: negative for SOB or persistent cough Gastrointestinal: negative for abdominal pain  Objective  Vitals: BP 122/78 (BP Location: Right Arm, Patient Position: Sitting, Cuff Size: Large)   Pulse 67   Temp 98.2 F (36.8 C) (Temporal)   Ht 5\' 4"  (1.626 m)   Wt 202 lb 3.2 oz (91.7 kg)   SpO2 98%   BMI 34.71 kg/m  General: no acute distress , A&Ox3 HEENT: neck is supple Cardiovascular:  RRR without murmur or gallop. No edema Respiratory:  Good breath sounds bilaterally, CTAB with normal respiratory effort Skin:  Warm, no rashes     Commons side effects, risks, benefits, and alternatives for medications and treatment plan prescribed today were discussed, and the patient expressed understanding of the given instructions. Patient is instructed to call or message via MyChart if he/she has any questions or concerns regarding our treatment plan. No barriers to understanding were identified. We discussed Red Flag symptoms and signs in detail. Patient expressed understanding regarding what to do in case of urgent or emergency type symptoms.   Medication list was reconciled, printed and provided to  the patient in AVS. Patient instructions and summary information was reviewed with the patient as documented in the AVS. This note was prepared with assistance of Dragon voice recognition software. Occasional wrong-word or sound-a-like substitutions may have occurred due to the inherent limitations of voice recognition software  This visit occurred during the SARS-CoV-2 public health emergency.  Safety protocols were in place, including screening questions prior to the visit, additional usage of staff PPE, and extensive cleaning of exam room while observing appropriate contact time as indicated for disinfecting solutions.

## 2019-05-18 ENCOUNTER — Ambulatory Visit: Payer: Medicare Other

## 2019-05-18 ENCOUNTER — Ambulatory Visit: Payer: Medicare Other | Attending: Family Medicine | Admitting: Physical Therapy

## 2019-05-18 ENCOUNTER — Ambulatory Visit: Payer: Medicare Other | Admitting: Occupational Therapy

## 2019-05-18 ENCOUNTER — Other Ambulatory Visit: Payer: Self-pay

## 2019-05-18 DIAGNOSIS — R41844 Frontal lobe and executive function deficit: Secondary | ICD-10-CM | POA: Insufficient documentation

## 2019-05-18 DIAGNOSIS — R29818 Other symptoms and signs involving the nervous system: Secondary | ICD-10-CM

## 2019-05-18 DIAGNOSIS — M6281 Muscle weakness (generalized): Secondary | ICD-10-CM | POA: Insufficient documentation

## 2019-05-18 DIAGNOSIS — M25612 Stiffness of left shoulder, not elsewhere classified: Secondary | ICD-10-CM

## 2019-05-18 DIAGNOSIS — R278 Other lack of coordination: Secondary | ICD-10-CM | POA: Diagnosis present

## 2019-05-18 DIAGNOSIS — M25611 Stiffness of right shoulder, not elsewhere classified: Secondary | ICD-10-CM

## 2019-05-18 DIAGNOSIS — R2681 Unsteadiness on feet: Secondary | ICD-10-CM | POA: Diagnosis present

## 2019-05-18 DIAGNOSIS — R41841 Cognitive communication deficit: Secondary | ICD-10-CM | POA: Insufficient documentation

## 2019-05-18 DIAGNOSIS — R2689 Other abnormalities of gait and mobility: Secondary | ICD-10-CM | POA: Diagnosis present

## 2019-05-18 NOTE — Therapy (Signed)
[facility] [address] [place], [name], [address] Phone: [phone]   Fax:  [phone]  Occupational Therapy Evaluation  Patient Details  Name: Tiffany [name] MRN: [id number] Date of Birth: 1950/03/24 Referring Provider (OT): Dr. Ranell Patrick   Encounter Date: 05/18/2019  OT End of Session - 05/18/19 1031    Visit Number  1    Number of Visits  17    Date for OT Re-Evaluation  07/17/19    Authorization Type  Medicare    Authorization - Visit Number  1    Authorization - Number of Visits  10    Progress Note Due on Visit  10    OT Start Time  [phone]    OT Stop Time  1015    OT Time Calculation (min)  37 min       Past Medical History:  Diagnosis Date  . Bipolar 1 disorder ([place])   . Breast cancer ([place])   . Chronic diarrhea    loose stools twice a day on average for years  . Chronic kidney disease (CKD), stage III (moderate)   . Depression 1987  . Malignant neoplasm of overlapping sites of left breast in female, estrogen receptor positive ([place]) 03/14/2016   Dx in 09/2014, s/p bilateral mastectomies and ALND, 0/10 LN. 1.4 cm  Grade I invasive lobular, ER and PR +/Her--, Ki 67 <5% Tried anastrozole for one month, but developed suicidal idea  . Parkinson's disease ([place]) 2012  . Psoriatic arthritis ([place])   . PTSD (post-traumatic stress disorder)   . Secondary hyperparathyroidism ([place])   . Tardive dyskinesia     Past Surgical History:  Procedure Laterality Date  . ABDOMINAL HYSTERECTOMY  1987  . CHOLECYSTECTOMY  1979  . DILATION AND CURETTAGE OF UTERUS  1973  . MASTECTOMY Bilateral 09/19/2014  . TONSILLECTOMY  1970  . URETERAL REIMPLANTION Bilateral 1974    Vitals:    Subjective Assessment - 05/18/19 1042    Subjective   Pt reports her handwriting has improved    Pertinent History  69 y.o. female with medical history significant of hypothyroidism, bipolar 1 disorder, psoriatic arthritis, Parkinsonism, COVID-19  positive on 1/23.  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  Pt received therapies at [facility] and she was d/c home 05/05/19.    Patient Stated Goals  increase indepdence and balance    Currently in Pain?  Yes    Pain Score  5     Pain Location  Generalized    Pain Descriptors / Indicators  Aching    Pain Type  Chronic pain    Pain Onset  1 to 4 weeks ago    Pain Frequency  Intermittent    Aggravating Factors   not taking meds    Pain Relieving Factors  tylenol    Effect of Pain on Daily Activities  limits daily activities        [facility] OT Assessment - 05/18/19 0945      Assessment   Medical Diagnosis  Debility, h/o Parkinson's (drug-induced Parkinsonism)    Referring Provider (OT)  Dr. Ranell Patrick    Onset Date/Surgical Date  04/03/19    Hand Dominance  Right      Precautions   Precautions  Fall      Balance Screen   Has the patient fallen in the past 6 months  Yes  How many times?  1    Has the patient had a decrease in activity level because of a fear of falling?   Yes    Is the patient reluctant to leave their home because of a fear of falling?   No      Home  Environment   Family/patient expects to be discharged to:  Private residence    Living Arrangements  Spouse/significant other    [place]  One level    Lives With  Spouse      Prior Function   Level of [place]  Enjoys walks in the neighborhood, church, community activities      ADL   Eating/Feeding  Modified independent   increased spills   Grooming  Modified independent    Upper Body Bathing  Supervision/safety    Lower Body Bathing  Supervision/safety    Upper Body Dressing  Supervision/safety    Lower Body Dressing  Supervision/safety    Toilet Transfer  Supervision/safety    Tub/Shower Transfer  Supervision/safety      IADL    Light Housekeeping  Performs light daily tasks such as dishwashing, bed making    Meal Prep  --   performing light cooking with assist     Mobility   Mobility Status  History of falls   supervision     Written Expression   Dominant Hand  Right    Handwriting  100% legible;Increased time;Mild micrographia      Cognition   Overall Cognitive Status  Impaired/Different from baseline    Area of Impairment  Memory    Memory  Impaired    Memory Impairment  Decreased short term memory    Cognition Comments  slower processing      Observation/Other Assessments   Other Surveys   Select    Physical Performance Test    Yes    Simulated Eating Time (seconds)  13.78      Posture/Postural Control   Posture/Postural Control  No significant limitations    Postural Limitations  Rounded Shoulders;Forward head      Coordination   Gross Motor Movements are Fluid and Coordinated  No    Fine Motor Movements are Fluid and Coordinated  No    9 Hole Peg Test  Right;Left    Right 9 Hole Peg Test  30 secs    Left 9 Hole Peg Test  29.85 secs    Box and Blocks  RUE 41, LUE 48 secs      ROM / Strength   AROM / PROM / Strength  AROM;Strength      AROM   Overall AROM   Deficits    Overall AROM Comments  RUe shoulder flexion 120, elbow extension -10, LUE shoulder flexion 115, elbow extension -10      Strength   Overall Strength  Deficits    Overall Strength Comments  Bilateral shoulders  grossly 4/5, proximal strength bilaterally 4+/5                        OT Short Term Goals - 05/18/19 1300      OT SHORT TERM GOAL #1   Title  I with HEP.    Time  4    Period  Weeks    Status  New    Target Date  06/17/19      OT SHORT TERM GOAL #2  Title  Pt will verbalize understanding of adapted strategies for ADLs/ I ADLs in order to maximize safety and I.    Time  4    Period  Weeks    Status  New      OT SHORT TERM GOAL #3   Title  Pt will demonstrate ability to write a  paragraph with 100% legibility and no significant decrease in letter size.    Baseline  Pt reports handwriting becomes smalller when she writes a paragraph    Time  4    Period  Weeks    Status  New      OT SHORT TERM GOAL #4   Title  Pt will demonstrate improved RUE functional reach/ use for ADLS as evidenced by increasing box/ blocks score by 3 blocks.    Time  4    Period  Weeks    Status  New        OT Long Term Goals - 05/18/19 1303      OT LONG TERM GOAL #1   Title  Pt will demonstrate ability to retieve a lightweight object at 125 shoulder flexion with -5 elbow extension.    Baseline  120, -10    Time  8    Period  Weeks    Status  New    Target Date  07/17/19      OT LONG TERM GOAL #2   Title  Pt will demonstrate ability to retrieve a lightweight object at 120 shoulder flexion with -5 elbow extension.    Baseline  115, -10    Time  8    Period  Weeks    Status  New      OT LONG TERM GOAL #3   Title  Pt will verbalize understanding of ways to keep thinking skills sharp and memory compensations prn.    Time  8    Period  Weeks    Status  New      OT LONG TERM GOAL #4   Title  Pt will perform basic cooking and home management with supervision.    Time  8    Period  Weeks    Status  New            Plan - 05/18/19 1034    Clinical Impression Statement  69 y.o. female with medical history significant of hypothyroidism,hx of breast cancer,  bipolar 1 disorder, psoriatic arthritis, Parkinsonism, COVID-19 positive on 1/23.  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  Pt received therapies at [facility] and she was d/c home 05/05/19.Pt presents with the following deficits: bradykinesia, tremor, decreased ROM, cognitive deficits, decreased balance, decreased functional mobility. decreased coordination, tremor which impedes perfromance of ADLs/  IADLS. Pt can benefit from skilled occupational therapy to address these deficits in order to maximize pt's safety and indpendence with ADls/ IADLs.    OT Occupational Profile and History  Detailed Assessment- Review of Records and additional review of physical, cognitive, psychosocial history related to current functional performance    Occupational performance deficits (Please refer to evaluation for details):  ADL's;IADL's;Leisure;Social Participation    Body Structure / Function / Physical Skills  ADL;Balance;Mobility;Strength;Flexibility;FMC;Coordination;GMC;Decreased knowledge of precautions;ROM;Decreased knowledge of use of DME;IADL;Dexterity;Sensation;UE functional use    Youth worker;Understand;Thought    Rehab Potential  Good    Clinical Decision Making  Limited treatment options, no task  modification necessary    Comorbidities Affecting Occupational Performance:  May have comorbidities impacting occupational performance    Modification or Assistance to Complete Evaluation   No modification of tasks or assist necessary to complete eval    OT Frequency  2x / week   plus eval, may d/c after 6 weeks dependent upon progress   OT Duration  8 weeks    OT Treatment/Interventions  Self-care/ADL training;Therapeutic exercise;Balance training;Functional Mobility Training;Aquatic Therapy;Neuromuscular education;Manual Therapy;Therapeutic activities;Energy conservation;Paraffin;DME and/or AE instruction;Cognitive remediation/compensation;Gait Training;Fluidtherapy;Moist Heat;Passive range of motion;Patient/family education    Plan  initiate HEP for shoulder ROM, can do PWR! supine/ seated  or cane exercises    Consulted and Agree with Plan of Care  Patient;Family member/caregiver    Family Member Consulted  husband       Patient will benefit from skilled therapeutic intervention in order to improve the following deficits and impairments:   Body Structure / Function / Physical Skills:  ADL, Balance, Mobility, Strength, Flexibility, FMC, Coordination, GMC, Decreased knowledge of precautions, ROM, Decreased knowledge of use of DME, IADL, Dexterity, Sensation, UE functional use Cognitive Skills: Memory, Understand, Thought     Visit Diagnosis: Other symptoms and signs involving the nervous system - Plan: Ot plan of care cert/re-cert  Other lack of coordination - Plan: Ot plan of care cert/re-cert  Other abnormalities of gait and mobility - Plan: Ot plan of care cert/re-cert  Stiffness of left shoulder, not elsewhere classified - Plan: Ot plan of care cert/re-cert  Stiffness of right shoulder, not elsewhere classified - Plan: Ot plan of care cert/re-cert  Frontal lobe and executive function deficit - Plan: Ot plan of care cert/re-cert    Problem List Patient Active Problem List   Diagnosis Date Noted  . Chronic diarrhea 05/05/2019  . Bipolar I disorder, most recent episode depressed ([place])   . Transaminitis   . Essential hypertension   . Seizures ([place])   . Encephalopathy 04/26/2019  . Leukocytosis 04/21/2019  . Thrombocytopenia ([place]) 09/04/2018  . Vitamin D deficiency 09/04/2018  . Psoriatic arthritis ([place]) 09/04/2018  . Tubular adenoma of colon 05/28/2017  . 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 Chronic kidney disease, stage III (moderate) 01/19/2016  . Tardive dyskinesia 10/18/2015  . History of breast cancer left 2016 12/27/2014  . Osteopenia determined by x-ray 10/31/2014  . Rosacea 05/21/2007  . Bipolar affective disorder, mixed ([place]) 08/16/2004  . Acquired hypothyroidism 04/03/1996    Tryson Lumley 05/18/2019, 3:49 PM Theone Murdoch, OTR/L [phone] Phone: [phone] 3:49 PM 05/18/19 [facility] [address] [place], [name], [address] Phone: [phone]   Fax:  [phone]  Name: Tiffany [name] MRN: [id number] Date of Birth:  [birth year]

## 2019-05-18 NOTE — Therapy (Signed)
[facility] [address] [place], [name], [address] Phone: [phone]   Fax:  [phone]  Physical Therapy Evaluation  Patient Details  Name: Tiffany [name] MRN: [id number] Date of Birth: July 03, 1950 Referring Provider (PT): Marlowe Shores, [place]   Encounter Date: 05/18/2019  PT End of Session - 05/18/19 1304    Visit Number  1    Number of Visits  17    Date for PT Re-Evaluation  94/85/46   90 day recert for 8 wk POC   Authorization Type  Medicare/[employer]- will need 10th visit progress note    PT Start Time  0856   Pt arrived late   PT Stop Time  0933    PT Time Calculation (min)  37 min    Activity Tolerance  Patient tolerated treatment well    Behavior During Therapy  [facility] for tasks assessed/performed       Past Medical History:  Diagnosis Date  . Bipolar 1 disorder ([place])   . Breast cancer ([place])   . Chronic diarrhea    loose stools twice a day on average for years  . Chronic kidney disease (CKD), stage III (moderate)   . Depression 1987  . Malignant neoplasm of overlapping sites of left breast in female, estrogen receptor positive ([place]) 03/14/2016   Dx in 09/2014, s/p bilateral mastectomies and ALND, 0/10 LN. 1.4 cm  Grade I invasive lobular, ER and PR +/Her--, Ki 67 <5% Tried anastrozole for one month, but developed suicidal idea  . Parkinson's disease ([place]) 2012  . Psoriatic arthritis ([place])   . PTSD (post-traumatic stress disorder)   . Secondary hyperparathyroidism ([place])   . Tardive dyskinesia     Past Surgical History:  Procedure Laterality Date  . ABDOMINAL HYSTERECTOMY  1987  . CHOLECYSTECTOMY  1979  . DILATION AND CURETTAGE OF UTERUS  1973  . MASTECTOMY Bilateral 09/19/2014  . TONSILLECTOMY  1970  . URETERAL REIMPLANTION Bilateral 1974    There were no vitals filed for this visit.   Subjective Assessment - 05/18/19 0902    Subjective  Pt was hospitalized, in rehab d/c home 05/05/2019.  Had Covid at the  end of January, then had a fall 04/21/19, went to [facility].  Prior to hospitalization, had some balance issues and tremors.  No falls in the past 6 months other than the one in February.    Patient is accompained by:  Family member    Pertinent History  EVO:[id number], bipolar 1 disorder, psoriatic arthritis, Parkinsonism (medication-induced), COVID-19 positive on 04/03/2019    Patient Stated Goals  Pt's goals for therapy are to improve strength and balance.    Currently in Pain?  Yes    Pain Score  7    9/10 prior to taking Tylenol   Pain Location  Other (Comment)   R side-wrist, knee, ankle; L hip and L shoulder   Pain Orientation  Right;Left    Pain Descriptors / Indicators  Throbbing;Aching   Intense   Pain Type  Acute pain   Psoriatic arthritis flareup   Pain Onset  1 to 4 weeks ago    Pain Frequency  Constant    Aggravating Factors   unsure    Pain Relieving Factors  Medication, hot shower, heating pad         [facility] PT Assessment - 05/18/19 0910      Assessment   Medical Diagnosis  Debility, h/o Parkinson's (drug-induced Parkinsonism)    Referring Provider (PT)  Marlowe Shores,  [place]    Onset Date/Surgical Date  04/03/19    Hand Dominance  Right      Precautions   Precautions  Fall      Balance Screen   Has the patient fallen in the past 6 months  Yes    How many times?  1    Has the patient had a decrease in activity level because of a fear of falling?   Yes    Is the patient reluctant to leave their home because of a fear of falling?   Yes      [place]  Private residence    Living Arrangements  Spouse/significant other;Children   Daughter   Available Help at Discharge  Family    Type of [place] to enter    Entrance Stairs-Number of Steps  2    [place]  One level    [place] - 4 wheels;Bedside commode;Shower seat;Grab bars - tub/shower    Additional  Comments  Just moved to [place] from [place] several months ago.  Family and grandchildren nearby.      Prior Function   Level of Independence  Independent    Leisure  Enjoys walks in the neighborhood, church, community activities      Observation/Other Assessments   Focus on Therapeutic Outcomes (FOTO)   NA      Posture/Postural Control   Posture/Postural Control  Postural limitations    Postural Limitations  Rounded Shoulders;Forward head      ROM / Strength   AROM / PROM / Strength  Strength      Strength   Overall Strength  Deficits    Strength Assessment Site  Hip;Knee;Ankle    Right/Left Hip  Right;Left    Right Hip Flexion  4/5    Left Hip Flexion  4+/5    Right/Left Knee  Right;Left    Right Knee Flexion  4/5    Right Knee Extension  4/5    Left Knee Flexion  4+/5    Left Knee Extension  4+/5    Right/Left Ankle  Right;Left    Right Ankle Dorsiflexion  4/5    Left Ankle Dorsiflexion  4/5      Transfers   Transfers  Sit to Stand;Stand to Sit    Sit to Stand  5: Supervision;From chair/3-in-1;Without upper extremity assist    Five time sit to stand comments   21.63    Stand to Sit  5: Supervision;Without upper extremity assist;To chair/3-in-1      Ambulation/Gait   Ambulation/Gait  Yes    Ambulation/Gait Assistance  5: Supervision    Ambulation Distance (Feet)  100 Feet    Assistive device  Rollator    Gait Pattern  Step-through pattern;Shuffle;Trunk flexed;Poor foot clearance - left;Poor foot clearance - right    Ambulation Surface  Level;Indoor    Gait velocity  20.59 sec = 1.59 ft/sec      Standardized Balance Assessment   Standardized Balance Assessment  Timed Up and Go Test      Timed Up and Go Test   Normal TUG (seconds)  20.46    Cognitive TUG (seconds)  25.1    TUG Comments  Scores >13.5-15 seconds indicates increased fall risk.                Objective measurements completed on examination: See above findings.  PT  Education - 05/18/19 1139    Education Details  Eval results, POC    Person(s) Educated  Patient;Spouse    Methods  Explanation    Comprehension  Verbalized understanding       PT Short Term Goals - 05/18/19 1445      PT SHORT TERM GOAL #1   Title  Pt will perform HEP with family supervision for improved strength, balance, and gait.  TARGET:  06/18/2019    Time  4    Period  Weeks    Status  New      PT SHORT TERM GOAL #2   Title  Pt will improve TUG to less than or equal to 15 seconds for decreased fall risk.    Time  4    Period  Weeks    Status  New      PT SHORT TERM GOAL #3   Title  Berg Balance test to be assessed, with goal to be written as appropriate.    Time  4    Period  Weeks    Status  New      PT SHORT TERM GOAL #4   Title  Pt will improve 5x sit<>stand to less than or equal to 18 seconds for improved functional strength.    Time  4    Period  Weeks    Status  New      PT SHORT TERM GOAL #5   Title  Pt/husband will verbalize understanding of fall prevention in home environment.    Time  4    Period  Weeks    Status  New        PT Long Term Goals - 05/18/19 1448      PT LONG TERM GOAL #1   Title  Pt will perform HEP with supervision of family for improved balance, strength, gait.  TARGET 07/16/2019    Time  8    Period  Weeks    Status  New      PT LONG TERM GOAL #2   Title  Pt will improve gait velocity to at least 2 ft/sec for improved gait efficiency and safety in the community.    Time  8    Period  Weeks    Status  New      PT LONG TERM GOAL #3   Title  Pt will improve Berg Score by 10 points from initial measure, for decreased fall risk.    Time  8    Period  Weeks    Status  New      PT LONG TERM GOAL #4   Title  Pt will improve 5x sit<>stand to less than or equal to 15 seconds for improved functional strength.    Time  8    Period  Weeks    Status  New      PT LONG TERM GOAL #5   Title  Pt will ambulate at least 1000 ft, modified  independently with rollator, indoor and outdoor surfaces, for return to community mobility.    Time  8    Period  Weeks    Status  New             Plan - 05/18/19 1306    Clinical Impression Statement  Pt is a 69 year old female with medical history significant of hypothyroidism, bipolar 1 disorder, psoriatic arthritis, Parkinsonism, COVID-19 positive on 1/23.  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  Pt received therapies at [facility] and she was d/c home 05/05/19.  She presents to OPPT with decreased strength, decreased balance, decreased gait velocity (limited community ambulator and fall risk at 1.59 ft/sec).  Pt is at fall risk per TUG and gait velocity scores.  Prior to hospitalization, she was independent and she enjoyed walking in her neighborhood.  She will benefit from skilled PT to address the above stated deficits for decreased fall risk and improved overall functional mobility.    Personal Factors and Comorbidities  Comorbidity 3+    Comorbidities  See above    Examination-Activity Limitations  Locomotion Level;Transfers;Stand    Examination-Participation Restrictions  Church;Community Activity    Stability/Clinical Decision Making  Evolving/Moderate complexity    Clinical Decision Making  Moderate    Rehab Potential  Good    PT Frequency  2x / week    PT Duration  8 weeks   plus eval   PT Treatment/Interventions  ADLs/Self Care Home Management;DME Instruction;Neuromuscular re-education;Balance training;Therapeutic activities;Therapeutic exercise;Functional mobility training;Gait training;Patient/family education    PT Next Visit Plan  Initiate HEP for lower extremity strengthening, balance.  Assess Berg Balance test and write goal as appropriate; gait training with rollator    Consulted and Agree with Plan of Care  Patient       Patient will  benefit from skilled therapeutic intervention in order to improve the following deficits and impairments:  Abnormal gait, Decreased balance, Decreased mobility, Decreased strength, Postural dysfunction, Other (comment)(Pain (due to psoriatic arthritis), not to be specifically addressed due to chronic issue)  Visit Diagnosis: Unsteadiness on feet  Other abnormalities of gait and mobility  Muscle weakness (generalized)     Problem List Patient Active Problem List   Diagnosis Date Noted  . Chronic diarrhea 05/05/2019  . Bipolar I disorder, most recent episode depressed ([place])   . Transaminitis   . Essential hypertension   . Seizures ([place])   . Encephalopathy 04/26/2019  . Leukocytosis 04/21/2019  . Thrombocytopenia ([place]) 09/04/2018  . Vitamin D deficiency 09/04/2018  . Psoriatic arthritis ([place]) 09/04/2018  . Tubular adenoma of colon 05/28/2017  . 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 Chronic kidney disease, stage III (moderate) 01/19/2016  . Tardive dyskinesia 10/18/2015  . History of breast cancer left 2016 12/27/2014  . Osteopenia determined by x-ray 10/31/2014  . Rosacea 05/21/2007  . Bipolar affective disorder, mixed ([place]) 08/16/2004  . Acquired hypothyroidism 04/03/1996    Luverne Farone W. 05/18/2019, 3:08 PM Frazier Butt., PT  [facility] [address] [place], [name], [address] Phone: [phone]   Fax:  [phone]  Name: Tiffany [name] MRN: [id number] Date of Birth: January 04, 1951

## 2019-05-19 ENCOUNTER — Ambulatory Visit: Payer: Medicare Other | Admitting: Psychology

## 2019-05-19 ENCOUNTER — Ambulatory Visit (INDEPENDENT_AMBULATORY_CARE_PROVIDER_SITE_OTHER): Payer: Medicare Other | Admitting: Family Medicine

## 2019-05-19 ENCOUNTER — Encounter: Payer: Self-pay | Admitting: Family Medicine

## 2019-05-19 VITALS — BP 122/78 | HR 74 | Temp 98.3°F | Ht 64.0 in | Wt 201.8 lb

## 2019-05-19 DIAGNOSIS — M79609 Pain in unspecified limb: Secondary | ICD-10-CM | POA: Diagnosis not present

## 2019-05-19 DIAGNOSIS — R202 Paresthesia of skin: Secondary | ICD-10-CM

## 2019-05-19 NOTE — Therapy (Signed)
[facility] [address] [place], [name], [address] Phone: [phone]   Fax:  [phone]  Speech Language Pathology Evaluation  Patient Details  Name: Tiffany [name] MRN: [id number] Date of Birth: 28-[name]-1951 No data recorded  Encounter Date: 05/18/2019  End of Session - 05/19/19 1623    Visit Number  1    Number of Visits  17    Date for SLP Re-Evaluation  08/23/19   90 days   SLP Start Time  13    SLP Stop Time   1100    SLP Time Calculation (min)  40 min    Activity Tolerance  Patient tolerated treatment well       Past Medical History:  Diagnosis Date  . Bipolar 1 disorder ([place])   . Breast cancer ([place])   . Chronic diarrhea    loose stools twice a day on average for years  . Chronic kidney disease (CKD), stage III (moderate)   . Depression 1987  . Malignant neoplasm of overlapping sites of left breast in female, estrogen receptor positive ([place]) 03/14/2016   Dx in 09/2014, s/p bilateral mastectomies and ALND, 0/10 LN. 1.4 cm  Grade I invasive lobular, ER and PR +/Her--, Ki 67 <5% Tried anastrozole for one month, but developed suicidal idea  . Parkinson's disease ([place]) 2012  . Psoriatic arthritis ([place])   . PTSD (post-traumatic stress disorder)   . Secondary hyperparathyroidism ([place])   . Tardive dyskinesia     Past Surgical History:  Procedure Laterality Date  . ABDOMINAL HYSTERECTOMY  1987  . CHOLECYSTECTOMY  1979  . DILATION AND CURETTAGE OF UTERUS  1973  . MASTECTOMY Bilateral 09/19/2014  . TONSILLECTOMY  1970  . URETERAL REIMPLANTION Bilateral 1974    There were no vitals filed for this visit.      SLP Evaluation [facility] - 05/19/19 0001      SLP Visit Information   SLP Received On  05/18/19    Onset Date  04-21-19 - acute onset, but some difficulties prior to this date as well      Subjective   Subjective  Pt exacerbated deficits with COVID- 19 at end of January.       Pain Assessment   Currently in Pain?  --      General Information   HPI  PMH: PTSD, bipolar with lithium, COVID-19 in January 2021. 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 Concern of seizures s well as lithium toxicity was addressed. Encephalopathy was seen in setting of COVID and pt told this may take weeks/months to recover. Pt prior to admission, had adult daughter cooking and doing laundry and other household tasks. Pt, currently has been managing her mediction dosage times approx 50% of the time, husband filling med box.       Balance Screen   Has the patient fallen in the past 6 months  Yes    How many times?  1    Has the patient had a decrease in activity level because of a fear of falling?   Yes    Is the patient reluctant to leave their home because of a fear of falling?   No      Prior Functional Status   Cognitive/Linguistic Baseline  Baseline deficits    Baseline deficit details  Memory, processing (per husband, pt)    Type of Home  House  Lives With  Spouse;Daughter    Vocation  Retired      Associate Professor   Overall Cognitive Status  Impaired/Different from baseline    Area of Impairment  --    Memory  Impaired    Memory Impairment  Decreased short term memory;Retrieval deficit    Awareness  Impaired    Awareness Impairment  Emergent impairment    Executive Function  Organizing;Self Correcting;Self Monitoring    Organizing  Impaired    Organizing Impairment  Functional basic    Self Correcting  Impaired    Self Correcting Impairment  Functional basic      Auditory Comprehension   Overall Auditory Comprehension  Appears within functional limits for tasks assessed      Verbal Expression   Overall Verbal Expression  Appears within functional limits for tasks assessed      Oral Motor/Sensory Function   Overall Oral Motor/Sensory Function  Appears within functional limits for tasks  assessed      Motor Speech   Overall Motor Speech  Appears within functional limits for tasks assessed      Standardized Assessments   Standardized Assessments   Cognitive Linguistic Quick Test (CLQT)     Cognitive Linguistic Quick Test (Ages 70+)   Memory  Mild    Language  WNL    Severity Rating Total  7    Composite Severity Rating  7       CLQT to be completed next session.                 SLP Education - 05/19/19 1622    Education Details  eval results, possible goals for attention to detail/attention, organization and awareness, finish evaluation next session    Person(s) Educated  Patient;Spouse    Methods  Explanation    Comprehension  Verbalized understanding;Need further instruction       SLP Short Term Goals - 05/19/19 1631      SLP SHORT TERM GOAL #1   Title  pt will demo WNL organization in simple - mod complex communicative tasks in 3 sessions with modified independence    Time  4    Period  Weeks    Status  New      SLP SHORT TERM GOAL #2   Title  pt will demo error awareness in cognitve linguistic tasks in the therapy session with occaisonal min A in 3 sessions    Time  4    Period  Weeks    Status  New      SLP SHORT TERM GOAL #3   Title  pt will bring a memory compensation device/item to therapy in 5 sessions    Time  4    Period  Weeks    Status  New       SLP Long Term Goals - 05/19/19 1634      SLP LONG TERM GOAL #1   Title  Pt will use a memory compensation device to track meds, appointments, errands, recent events, etc with min nonverbal cues in 8 sessions    Time  8    Period  Weeks   or 17 sessions, for all LTGs   Status  New      SLP LONG TERM GOAL #2   Title  pt will demo [facility] organization skills for mod complex cogntive linguistic tasks with extra time allowed in 3 sessions    Time  8    Period  Weeks    Status  New      SLP LONG TERM GOAL #3   Title  pt will demonstrate anticipatory awareness by spontaneously  double checking a detailed written or auditory task in 5 therapy sessions    Time  8    Period  Weeks    Status  New      SLP LONG TERM GOAL #4   Title  pt will demo [facility] organization in mod complex communicative tasks in 3 sessions with modified independence    Time  8    Period  Weeks    Status  New       Plan - 05/19/19 1623    Clinical Impression Statement  Pt presents with at least mild cognitive communication deficits - CognitiveLinguistic Quick Test to be completed next session. Pt demonstrated dificulty with emergent awareness, memory, attention and attention to detail, and executive function skills such as organization, self monitoring and self correction. As pt had suspected seizures, and bouts of lithium toxicity prior to current CVA, pt's exact premorbid defiicts other than memory are somewhat unknown. Family was doing a lot for pt prior to current admission due to CVA. Pt cooked her first meal last night with some minor assistance, per husband. Reportedly, this was more than pt had done prior to most recent admission. SLP believes pt will beneift from skilled ST targeting cognitive communication deficits to maximize pt's skills to commnicate and interact in the home and community.    Speech Therapy Frequency  2x / week    Duration  --   8 weeks or 17 total sessions   Treatment/Interventions  Environmental controls;Functional tasks;Compensatory techniques;SLP instruction and feedback;Cueing hierarchy;Cognitive reorganization;Internal/external aids;Patient/family education    Potential to Achieve Goals  Good    Potential Considerations  Previous level of function    Consulted and Agree with Plan of Care  Patient;Family member/caregiver    Family Member Consulted  husband       Patient will benefit from skilled therapeutic intervention in order to improve the following deficits and impairments:   Cognitive communication deficit - Plan: SLP plan of care cert/re-cert    Problem  List Patient Active Problem List   Diagnosis Date Noted  . Chronic diarrhea 05/05/2019  . Bipolar I disorder, most recent episode depressed ([place])   . Transaminitis   . Essential hypertension   . Seizures ([place])   . Encephalopathy 04/26/2019  . Leukocytosis 04/21/2019  . Thrombocytopenia ([place]) 09/04/2018  . Vitamin D deficiency 09/04/2018  . Psoriatic arthritis ([place]) 09/04/2018  . Tubular adenoma of colon 05/28/2017  . 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 Chronic kidney disease, stage III (moderate) 01/19/2016  . Tardive dyskinesia 10/18/2015  . History of breast cancer left 2016 12/27/2014  . Osteopenia determined by x-ray 10/31/2014  . Rosacea 05/21/2007  . Bipolar affective disorder, mixed ([place]) 08/16/2004  . Acquired hypothyroidism 04/03/1996    [facility] ,[place], [place]  05/19/2019, 4:41 PM  [facility] [address] [place], [name], [address] Phone: [phone]   Fax:  [phone]  Name: Tiffany [name] MRN: [id number] Date of Birth: 20-Nov-1950

## 2019-05-19 NOTE — Progress Notes (Signed)
Subjective  CC:  Chief Complaint  Patient presents with  . Joint Pain    chronic joint pain due to arthritis. off arthritis injections (cimzia). 10/10 pain this morning on right forearm. took Tylenol to alleviate pain.    HPI: Tiffany [name] is a 69 y.o. female who presents to the office today to address the problems listed above in the chief complaint.  Acute onset of right forearm pain described as unbearable, severe and between the wrist and elbow. Sharp and continuous for about 1-2 hours. Took tylenol and ativan and it subsided slowly with parsthesias afterwards; also and tingling in left lower leg. No muscle cramps, soreness, color changes, swelling or warmth. Arm was completely functional and nothing she did with it made the pain better or worse. This pain is new; different from her psoriatic arthritis pain. Never had it before. Now feels fine. Still anxious though.   Rheum called in some tramadol for her to use if it reoccurs.   Assessment  1. Paresthesia and pain of right extremity      Plan   paresthesia and pain:  Unclear cause. Normal exam now and resolved spontaneously. Will monitor.   Follow up: as scheduled.  06/25/2019  No orders of the defined types were placed in this encounter.  No orders of the defined types were placed in this encounter.     I reviewed the patients updated PMH, FH, and SocHx.    Patient Active Problem List   Diagnosis Date Noted  . Chronic diarrhea 05/05/2019    Priority: High  . Essential hypertension     Priority: High  . Seizures ([place])     Priority: High  . Encephalopathy 04/26/2019    Priority: High  . Tubular adenoma of colon 05/28/2017    Priority: High  . Chronic kidney disease, stage III (moderate) 01/19/2016    Priority: High  . Tardive dyskinesia 10/18/2015    Priority: High  . Bipolar affective disorder, mixed ([place]) 08/16/2004    Priority: High  . Acquired hypothyroidism 04/03/1996    Priority: High  .  Thrombocytopenia ([place]) 09/04/2018    Priority: Medium  . History of breast cancer left 2016 12/27/2014    Priority: Medium  . Vitamin D deficiency 09/04/2018    Priority: Low  . Psoriatic arthritis ([place]) 09/04/2018    Priority: Low  . 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 Osteopenia determined by x-ray 10/31/2014    Priority: Low  . Rosacea 05/21/2007    Priority: Low  . Bipolar I disorder, most recent episode depressed ([place])   . Transaminitis   . Leukocytosis 04/21/2019   Current Meds  Medication Sig  . acetaminophen (TYLENOL) 500 MG tablet Take 500 mg by mouth daily as needed for headache (pain).   Marland Kitchen amLODipine (NORVASC) 2.5 MG tablet Take 1 tablet (2.5 mg total) by mouth daily.  . calcitRIOL (ROCALTROL) 0.25 MCG capsule Take 0.25 mcg by mouth every Monday, Wednesday, and Friday.   . Cholecalciferol 50 MCG (2000 UT) TABS Take 1 tablet (2,000 Units total) by mouth See admin instructions. Take 1 tablet by mouth daily  . lamoTRIgine (LAMICTAL) 100 MG tablet Take 1 tablet (100 mg total) by mouth at bedtime.  . levETIRAcetam (KEPPRA) 250 MG tablet Take 1 tablet (250 mg total) by mouth 2 (two) times daily.  Marland Kitchen levothyroxine (SYNTHROID) 112 MCG tablet Take 1 tablet (112 mcg total) by mouth daily at 6 (six) AM.  .  loperamide (IMODIUM) 2 MG capsule Take 1 capsule (2 mg total) by mouth as needed for diarrhea or loose stools.  Marland Kitchen LORazepam (ATIVAN) 1 MG tablet Take 1 tablet (1 mg total) by mouth 2 (two) times daily.  . sodium bicarbonate 650 MG tablet Take 1 tablet (650 mg total) by mouth 2 (two) times daily.    Allergies: Patient is allergic to aripiprazole; lactose intolerance (gi); methotrexate; cefdinir; etanercept; exemestane; fluoxetine; methylprednisolone sodium succ; epinephrine; and nitrofurantoin. Family History: Patient family history includes Cancer in her mother, sister, and sister; Diabetes in her paternal grandfather;  Stroke in her maternal grandfather. Social History:  Patient  reports that she has never smoked. She has never used smokeless tobacco. She reports that she does not drink alcohol or use drugs.  Review of Systems: Constitutional: Negative for fever malaise or anorexia Cardiovascular: negative for chest pain Respiratory: negative for SOB or persistent cough Gastrointestinal: negative for abdominal pain  Objective  Vitals: BP 122/78 (BP Location: Right Arm, Patient Position: Sitting, Cuff Size: Large)   Pulse 74   Temp 98.3 F (36.8 C) (Temporal)   Ht 5\' 4"  (1.626 m)   Wt 201 lb 12.8 oz (91.5 kg)   SpO2 97%   BMI 34.64 kg/m  General: no acute distress , A&Ox3 Right arm: nl elbow exam. No ttp. FROM, nl strength of right upper extremity. Appears normal w/o joint swelling or skin changes. No mm ttp. Nl sensation   Commons side effects, risks, benefits, and alternatives for medications and treatment plan prescribed today were discussed, and the patient expressed understanding of the given instructions. Patient is instructed to call or message via MyChart if he/she has any questions or concerns regarding our treatment plan. No barriers to understanding were identified. We discussed Red Flag symptoms and signs in detail. Patient expressed understanding regarding what to do in case of urgent or emergency type symptoms.   Medication list was reconciled, printed and provided to the patient in AVS. Patient instructions and summary information was reviewed with the patient as documented in the AVS. This note was prepared with assistance of Dragon voice recognition software. Occasional wrong-word or sound-a-like substitutions may have occurred due to the inherent limitations of voice recognition software  This visit occurred during the SARS-CoV-2 public health emergency.  Safety protocols were in place, including screening questions prior to the visit, additional usage of staff PPE, and extensive  cleaning of exam room while observing appropriate contact time as indicated for disinfecting solutions.

## 2019-05-22 ENCOUNTER — Other Ambulatory Visit: Payer: Self-pay | Admitting: Physical Medicine and Rehabilitation

## 2019-05-26 ENCOUNTER — Ambulatory Visit (INDEPENDENT_AMBULATORY_CARE_PROVIDER_SITE_OTHER): Payer: Medicare Other | Admitting: Psychology

## 2019-05-26 DIAGNOSIS — F314 Bipolar disorder, current episode depressed, severe, without psychotic features: Secondary | ICD-10-CM | POA: Diagnosis not present

## 2019-05-28 ENCOUNTER — Ambulatory Visit: Payer: Medicare Other | Admitting: Physical Therapy

## 2019-05-28 ENCOUNTER — Ambulatory Visit: Payer: Medicare Other | Admitting: Occupational Therapy

## 2019-05-29 ENCOUNTER — Encounter: Payer: Self-pay | Admitting: Family Medicine

## 2019-05-31 MED ORDER — AMLODIPINE BESYLATE 2.5 MG PO TABS: 2.5000 mg | ORAL_TABLET | Freq: Every day | ORAL | 3 refills | Status: DC

## 2019-05-31 MED ORDER — LEVOTHYROXINE SODIUM 112 MCG PO TABS: 112.0000 ug | ORAL_TABLET | Freq: Every day | ORAL | 3 refills | Status: DC

## 2019-05-31 NOTE — Telephone Encounter (Signed)
Ok to send in more refills? Please advise thanks.

## 2019-05-31 NOTE — Telephone Encounter (Signed)
Please refill meds, #90 w/ 3 rf.

## 2019-06-02 ENCOUNTER — Ambulatory Visit: Payer: Medicare Other | Admitting: Occupational Therapy

## 2019-06-02 ENCOUNTER — Ambulatory Visit: Payer: Medicare Other

## 2019-06-02 ENCOUNTER — Encounter: Payer: Self-pay | Admitting: Occupational Therapy

## 2019-06-02 ENCOUNTER — Other Ambulatory Visit: Payer: Self-pay

## 2019-06-02 ENCOUNTER — Ambulatory Visit (INDEPENDENT_AMBULATORY_CARE_PROVIDER_SITE_OTHER): Payer: Medicare Other | Admitting: Psychology

## 2019-06-02 DIAGNOSIS — M6281 Muscle weakness (generalized): Secondary | ICD-10-CM

## 2019-06-02 DIAGNOSIS — R29818 Other symptoms and signs involving the nervous system: Secondary | ICD-10-CM

## 2019-06-02 DIAGNOSIS — R41844 Frontal lobe and executive function deficit: Secondary | ICD-10-CM

## 2019-06-02 DIAGNOSIS — R2689 Other abnormalities of gait and mobility: Secondary | ICD-10-CM

## 2019-06-02 DIAGNOSIS — R2681 Unsteadiness on feet: Secondary | ICD-10-CM

## 2019-06-02 DIAGNOSIS — F314 Bipolar disorder, current episode depressed, severe, without psychotic features: Secondary | ICD-10-CM

## 2019-06-02 DIAGNOSIS — M25611 Stiffness of right shoulder, not elsewhere classified: Secondary | ICD-10-CM

## 2019-06-02 DIAGNOSIS — M25612 Stiffness of left shoulder, not elsewhere classified: Secondary | ICD-10-CM

## 2019-06-02 DIAGNOSIS — R41841 Cognitive communication deficit: Secondary | ICD-10-CM

## 2019-06-02 DIAGNOSIS — R278 Other lack of coordination: Secondary | ICD-10-CM

## 2019-06-02 NOTE — Therapy (Signed)
[facility] [address] [place], [name], [address] Phone: [phone]   Fax:  [phone]  Occupational Therapy Treatment  Patient Details  Name: Tiffany [name] MRN: [id number] Date of Birth: 1950-12-26 Referring Provider (OT): Dr. Ranell Patrick   Encounter Date: 06/02/2019  OT End of Session - 06/02/19 0934    Visit Number  2    Number of Visits  17    Date for OT Re-Evaluation  07/17/19    Authorization Type  Medicare    Authorization - Visit Number  2    Authorization - Number of Visits  10    Progress Note Due on Visit  10    OT Start Time  [phone]    OT Stop Time  1016    OT Time Calculation (min)  39 min    Activity Tolerance  Patient tolerated treatment well    Behavior During Therapy  [facility] for tasks assessed/performed       Past Medical History:  Diagnosis Date  . Bipolar 1 disorder ([place])   . Breast cancer ([place])   . Chronic diarrhea    loose stools twice a day on average for years  . Chronic kidney disease (CKD), stage III (moderate)   . Depression 1987  . Malignant neoplasm of overlapping sites of left breast in female, estrogen receptor positive ([place]) 03/14/2016   Dx in 09/2014, s/p bilateral mastectomies and ALND, 0/10 LN. 1.4 cm  Grade I invasive lobular, ER and PR +/Her--, Ki 67 <5% Tried anastrozole for one month, but developed suicidal idea  . Parkinson's disease ([place]) 2012  . Psoriatic arthritis ([place])   . PTSD (post-traumatic stress disorder)   . Secondary hyperparathyroidism ([place])   . Tardive dyskinesia     Past Surgical History:  Procedure Laterality Date  . ABDOMINAL HYSTERECTOMY  1987  . CHOLECYSTECTOMY  1979  . DILATION AND CURETTAGE OF UTERUS  1973  . MASTECTOMY Bilateral 09/19/2014  . TONSILLECTOMY  1970  . URETERAL REIMPLANTION Bilateral 1974    There were no vitals filed for this visit.  Subjective Assessment - 06/02/19 0938    Subjective   didn't sleep well last night which affects my  day, most concerned about sleep and frustration about not knowing how she is going to feel (physically and mood).  Pt reports feeling overwhelmed in a store.    Pertinent History  69 y.o. female with medical history significant of hypothyroidism, bipolar 1 disorder, psoriatic arthritis, Parkinsonism, COVID-19 positive on 1/23.  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  Pt received therapies at [facility] and she was d/c home 05/05/19.    Patient Stated Goals  increase indepdence and balance    Currently in Pain?  Yes    Pain Score  5     Pain Location  Knee   tops of R toes/foot   Pain Orientation  Right    Pain Descriptors / Indicators  Aching    Pain Type  Chronic pain    Pain Onset  1 to 4 weeks ago    Pain Frequency  Intermittent    Aggravating Factors   fluctuates, pressure    Pain Relieving Factors  unknown         Pt able to write grandchildren's names and copy 3 sentences with good legibility and size with incr time.  Placing small pegs in pegboard with  both hands to copy design with min v. for accuracy, min difficulty with in-hand manipulation R hand (switched to nondominant L hand) and hitting bowl of pegs on R side with R hand multiple times.  Discussed these observations with pt/husband and they will observe for functional difficulties at home.  Ambulation during session with noted hitting doorframe with LUE and min cueing for stepping inside walker/keeping walker close.          OT Education - 06/02/19 0945    Education Details  cane HEP--see pt instructions    Person(s) Educated  Patient;Spouse    Methods  Explanation;Demonstration;Verbal cues;Handout    Comprehension  Verbalized understanding;Returned demonstration       OT Short Term Goals - 05/18/19 1443      OT SHORT TERM GOAL #1   Title  I with HEP.    Time  4    Period  Weeks     Status  New    Target Date  06/17/19      OT SHORT TERM GOAL #2   Title  Pt will verbalize understanding of adpated strategies for ADLs/ I ADLs in order to maximize safety and I.    Time  4    Period  Weeks    Status  New      OT SHORT TERM GOAL #3   Title  Pt will demonstrate ability to write a paragraph with 100% legibility and no significant decrease in letter size.    Baseline  Pt reports handwriting becomes smalller when she writes a paragraph    Time  4    Period  Weeks    Status  New      OT SHORT TERM GOAL #4   Title  Pt will demonstrate improved RUE functional reach/ use for ADLS as evidenced by increasing box/ blocks score by 3 blocks.    Time  4    Period  Weeks    Status  New        OT Long Term Goals - 05/18/19 1303      OT LONG TERM GOAL #1   Title  Pt will demonstrate ability to retieve a lightweight object at 125 shoulder flexion with -5 elbow extension.    Baseline  120, -10    Time  8    Period  Weeks    Status  New    Target Date  07/17/19      OT LONG TERM GOAL #2   Title  Pt will demonstrate ability to retrieve a lightweight object at 120 shoulder flexion with -5 elbow extension.    Baseline  115, -10    Time  8    Period  Weeks    Status  New      OT LONG TERM GOAL #3   Title  Pt will verbalize understanding of ways to keep thinking skills sharp and memory compensations prn.    Time  8    Period  Weeks    Status  New      OT LONG TERM GOAL #4   Title  Pt will perform basic cooking and home management with supervision.    Time  8    Period  Weeks    Status  New            Plan - 06/02/19 1001    Clinical Impression Statement  Pt demo good ability to write with good size/legibility today.  Pt also returned demo HEP after initial  instruction.  Pt demo difficulty with R in-hand manipulation today and was observed to switch to L nondominant hand at times and hit things with R hand.    OT Occupational Profile and History  Detailed  Assessment- Review of Records and additional review of physical, cognitive, psychosocial history related to current functional performance    Occupational performance deficits (Please refer to evaluation for details):  ADL's;IADL's;Leisure;Social Participation    Body Structure / Function / Physical Skills  ADL;Balance;Mobility;Strength;Flexibility;FMC;Coordination;GMC;Decreased knowledge of precautions;ROM;Decreased knowledge of use of DME;IADL;Dexterity;Sensation;UE functional use    Youth worker;Understand;Thought    Rehab Potential  Good    Clinical Decision Making  Limited treatment options, no task modification necessary    Comorbidities Affecting Occupational Performance:  May have comorbidities impacting occupational performance    Modification or Assistance to Complete Evaluation   No modification of tasks or assist necessary to complete eval    OT Frequency  2x / week   plus eval, may d/c after 6 weeks dependent upon progress   OT Duration  8 weeks    OT Treatment/Interventions  Self-care/ADL training;Therapeutic exercise;Balance training;Functional Mobility Training;Aquatic Therapy;Neuromuscular education;Manual Therapy;Therapeutic activities;Energy conservation;Paraffin;DME and/or AE instruction;Cognitive remediation/compensation;Gait Training;Fluidtherapy;Moist Heat;Passive range of motion;Patient/family education    Plan  review cane HEP, strategies for ADLs, standing with functional reach    Consulted and Agree with Plan of Care  Patient;Family member/caregiver    Family Member Consulted  husband       Patient will benefit from skilled therapeutic intervention in order to improve the following deficits and impairments:   Body Structure / Function / Physical Skills: ADL, Balance, Mobility, Strength, Flexibility, FMC, Coordination, GMC, Decreased knowledge of precautions, ROM, Decreased knowledge of use of DME, IADL, Dexterity, Sensation, UE functional use Cognitive  Skills: Memory, Understand, Thought     Visit Diagnosis: Other symptoms and signs involving the nervous system  Other lack of coordination  Stiffness of left shoulder, not elsewhere classified  Other abnormalities of gait and mobility  Stiffness of right shoulder, not elsewhere classified  Frontal lobe and executive function deficit  Unsteadiness on feet  Muscle weakness (generalized)    Problem List Patient Active Problem List   Diagnosis Date Noted  . Chronic diarrhea 05/05/2019  . Bipolar I disorder, most recent episode depressed ([place])   . Transaminitis   . Essential hypertension   . Seizures ([place])   . Encephalopathy 04/26/2019  . Leukocytosis 04/21/2019  . Thrombocytopenia ([place]) 09/04/2018  . Vitamin D deficiency 09/04/2018  . Psoriatic arthritis ([place]) 09/04/2018  . Tubular adenoma of colon 05/28/2017  . 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 Chronic kidney disease, stage III (moderate) 01/19/2016  . Tardive dyskinesia 10/18/2015  . History of breast cancer left 2016 12/27/2014  . Osteopenia determined by x-ray 10/31/2014  . Rosacea 05/21/2007  . Bipolar affective disorder, mixed ([place]) 08/16/2004  . Acquired hypothyroidism 04/03/1996    [facility] 06/02/2019, 11:51 AM  [facility] [address] [place], [name], [address] Phone: [phone]   Fax:  [phone]  Name: Josanne Boerema MRN: [id number] Date of Birth: 28-Dec-1950   Vianne Bulls, OTR/L [facility] [address] [place], [place]  [address] [phone] phone [phone] 06/02/19 11:51 AM

## 2019-06-02 NOTE — Therapy (Signed)
[facility] [address] [place], [name], [address] Phone: [phone]   Fax:  [phone]  Speech Language Pathology Treatment  Patient Details  Name: Tiffany [name] MRN: [id number] Date of Birth: Aug 02, 1950 No data recorded  Encounter Date: 06/02/2019  End of Session - 06/02/19 1357    Visit Number  2    Number of Visits  17    Date for SLP Re-Evaluation  08/23/19   90 days   SLP Start Time  0848    SLP Stop Time   0930    SLP Time Calculation (min)  42 min    Activity Tolerance  Patient tolerated treatment well       Past Medical History:  Diagnosis Date  . Bipolar 1 disorder ([place])   . Breast cancer ([place])   . Chronic diarrhea    loose stools twice a day on average for years  . Chronic kidney disease (CKD), stage III (moderate)   . Depression 1987  . Malignant neoplasm of overlapping sites of left breast in female, estrogen receptor positive ([place]) 03/14/2016   Dx in 09/2014, s/p bilateral mastectomies and ALND, 0/10 LN. 1.4 cm  Grade I invasive lobular, ER and PR +/Her--, Ki 67 <5% Tried anastrozole for one month, but developed suicidal idea  . Parkinson's disease ([place]) 2012  . Psoriatic arthritis ([place])   . PTSD (post-traumatic stress disorder)   . Secondary hyperparathyroidism ([place])   . Tardive dyskinesia     Past Surgical History:  Procedure Laterality Date  . ABDOMINAL HYSTERECTOMY  1987  . CHOLECYSTECTOMY  1979  . DILATION AND CURETTAGE OF UTERUS  1973  . MASTECTOMY Bilateral 09/19/2014  . TONSILLECTOMY  1970  . URETERAL REIMPLANTION Bilateral 1974    There were no vitals filed for this visit.  Subjective Assessment - 06/02/19 0900    Subjective  Pt arrives for first session after eval (on 05-18-19) with husband. Husband: "She's off some of the meds she's always been on and now she's doing better than she has in a long time."    Patient is accompained by:  Family member   [name] - husband   Currently  in Pain?  Yes    Pain Location  Knee    Pain Orientation  Right    Pain Descriptors / Indicators  Aching    Pain Type  Chronic pain    Pain Onset  More than a month ago    Pain Frequency  Intermittent            ADULT SLP TREATMENT - 06/02/19 0901      General Information   Behavior/Cognition  Alert;Cooperative;Pleasant mood   flat affect?     Treatment Provided   Treatment provided  Cognitive-Linquistic      Cognitive-Linquistic Treatment   Treatment focused on  Cognition    Skilled Treatment  Husband reports pt made dinner again last night. Pt also able to write notes again to people, which she did prior. Husband and pt attribute this to rework of pt's meds. SLP suggested to pt and husband that we likely didn't know pt's baseline with a med overhaul pre-hospitalization, and now with a neurological event it may be that we find pt's baseline is reached without much ST. SLP unsure of validity of remainder of CLQT given that last session was 15 days ago. Pt self administered the Self-Administered Gerocognitive Evalation (SAGE). SLP asked pt questions about meds and pain in the midst of filling outthe  SAGE and pt returned to her place without difficulty or extra time. With side conversation pt noted to work a bit slower than without however pt states she would have had difficulty at baseline but severity seems greater now than before. Pt's scores on CLQT are as follows Attention-151 - mild, Memory-140 -mild; executive function;17- mild, language-29-WNL, Visuospatial skills-63-WNL, clock drawing 9- mild deficit. On SAGE, pt scored below the normal range with deficits in executive function (problem solving), and possible deficit in auditory processing/comprehension. SLP may have to assess this at later date.       Assessment / Recommendations / Plan   Plan  Continue with current plan of care;Goals updated      Progression Toward Goals   Progression toward goals  Progressing toward goals        SLP Education - 06/02/19 1356    Education Details  SLP to add goals after analysis of testing today    Person(s) Educated  Patient    Methods  Explanation    Comprehension  Verbalized understanding;Need further instruction       SLP Short Term Goals - 06/02/19 1402      SLP SHORT TERM GOAL #1   Title  pt will demo WNL organization in simple - mod complex communicative tasks in 3 sessions with modified independence    Time  4    Period  Weeks    Status  On-going      SLP SHORT TERM GOAL #2   Title  pt will demo error awareness in cognitve linguistic tasks in the therapy session with occaisonal min A in 3 sessions    Time  4    Period  Weeks    Status  On-going      SLP SHORT TERM GOAL #3   Title  pt will bring a memory compensation device/item to therapy in 5 sessions    Time  4    Period  Weeks    Status  On-going      SLP SHORT TERM GOAL #4   Title  pt will demo selective attention in 5 minutes min-mod noisy environment with rare min A back to task over 3 sessions    Time  4    Period  Weeks    Status  New       SLP Long Term Goals - 06/02/19 1403      SLP LONG TERM GOAL #1   Title  Pt will use a memory compensation device to track meds, appointments, errands, recent events, etc with min nonverbal cues in 8 sessions    Time  8    Period  Weeks   or 17 sessions, for all LTGs   Status  On-going      SLP LONG TERM GOAL #2   Title  pt will demo [facility] organization skills for mod complex cogntive linguistic tasks with extra time allowed in 3 sessions    Time  8    Period  Weeks    Status  On-going      SLP LONG TERM GOAL #3   Title  pt will demonstrate anticipatory awareness by spontaneously double checking a detailed written or auditory task in 5 therapy sessions    Time  8    Period  Weeks    Status  On-going      SLP LONG TERM GOAL #4   Title  pt will demo [facility] organization in mod complex communicative tasks in 3 sessions with modified independence  Time  8    Period  Weeks    Status  On-going      SLP LONG TERM GOAL #5   Title  pt will demo simple divided attention in 8 minutes simple-mod complex tasks over three sessions    Time  8    Period  Weeks    Status  New       Plan - 06/02/19 1358    Clinical Impression Statement  Pt presents with mild cognitive communication deficits as measured by Cognitive Linguistic Quick Test (CLQT). Pt demonstrated dificulty with emergent awareness, memory, attention and attention to detail, and executive function skills such as problem solving, self monitoring and self correction. As pt had suspected seizures, and bouts of lithium toxicity prior to current CVA, pt's exact premorbid defiicts other than memory are somewhat unknown. SLP shared this with pt/husband today re: pt's baseline skills may be two weeks or two months away (or longer). Pt had difficulty with selective attention in a mod complex task but simple alternating and divided attention were functional/normal during eval tasks while pt answering questions. SLP believes pt will beneift from skilled ST targeting cognitive communication deficits to maximize pt's skills to commnicate and interact in the home and community.    Speech Therapy Frequency  2x / week    Duration  --   8 weeks or 17 total sessions   Treatment/Interventions  Environmental controls;Functional tasks;Compensatory techniques;SLP instruction and feedback;Cueing hierarchy;Cognitive reorganization;Internal/external aids;Patient/family education    Potential to Achieve Goals  Good    Potential Considerations  Previous level of function    Consulted and Agree with Plan of Care  Patient;Family member/caregiver    Family Member Consulted  husband       Patient will benefit from skilled therapeutic intervention in order to improve the following deficits and impairments:   Cognitive communication deficit    Problem List Patient Active Problem List   Diagnosis Date Noted  .  Chronic diarrhea 05/05/2019  . Bipolar I disorder, most recent episode depressed ([place])   . Transaminitis   . Essential hypertension   . Seizures ([place])   . Encephalopathy 04/26/2019  . Leukocytosis 04/21/2019  . Thrombocytopenia ([place]) 09/04/2018  . Vitamin D deficiency 09/04/2018  . Psoriatic arthritis ([place]) 09/04/2018  . Tubular adenoma of colon 05/28/2017  . 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 Chronic kidney disease, stage III (moderate) 01/19/2016  . Tardive dyskinesia 10/18/2015  . History of breast cancer left 2016 12/27/2014  . Osteopenia determined by x-ray 10/31/2014  . Rosacea 05/21/2007  . Bipolar affective disorder, mixed ([place]) 08/16/2004  . Acquired hypothyroidism 04/03/1996    [facility] ,[place], [place]  06/02/2019, 2:06 PM  [place] [facility] [place] [address] [place], [name], [address] Phone: [phone]   Fax:  [phone]   Name: Tiffany [name] MRN: [id number] Date of Birth: [birth year]

## 2019-06-02 NOTE — Patient Instructions (Signed)
     Lie on back holding wand. Raise arms over head. Hold 5sec. Repeat 10 times per set.  Do 1-2 sessions per day.   ROM: Abduction - Wand   Holding wand with left hand palm up, push wand directly out to side, leading with other hand palm down, until stretch is felt. Hold 5 seconds. Repeat 10 times per set. Do 1-2 sessions per day.    ROM: Extension - Wand (Standing)   Stand holding wand behind back. Raise arms as far as possible. Repeat 10 times per set.  Do 1-2 sessions per day.    Cane Overhead - Standing   Sit, With arms straight, hold cane forward at waist. Raise cane above head. Hold 2 seconds. Repeat 10 times. Do 1-2 times per day.

## 2019-06-07 ENCOUNTER — Encounter: Payer: Self-pay | Admitting: Neurology

## 2019-06-07 ENCOUNTER — Other Ambulatory Visit: Payer: Self-pay

## 2019-06-07 ENCOUNTER — Ambulatory Visit (INDEPENDENT_AMBULATORY_CARE_PROVIDER_SITE_OTHER): Payer: Medicare Other | Admitting: Neurology

## 2019-06-07 VITALS — BP 141/91 | HR 68 | Ht 64.0 in | Wt 201.0 lb

## 2019-06-07 DIAGNOSIS — G40009 Localization-related (focal) (partial) idiopathic epilepsy and epileptic syndromes with seizures of localized onset, not intractable, without status epilepticus: Secondary | ICD-10-CM

## 2019-06-07 MED ORDER — LEVETIRACETAM 250 MG PO TABS: ORAL_TABLET | ORAL | 3 refills | Status: DC

## 2019-06-07 NOTE — Patient Instructions (Signed)
Great meeting you! Continue all your medications. Ok to restart [place] with Psychiatry and [place] with Rheumatology. Follow-up in 6 months, call for any changes  Seizure Precautions: 1. If medication has been prescribed for you to prevent seizures, take it exactly as directed.  Do not stop taking the medicine without talking to your doctor first, even if you have not had a seizure in a long time.   2. Avoid activities in which a seizure would cause danger to yourself or to others.  Don't operate dangerous machinery, swim alone, or climb in high or dangerous places, such as on ladders, roofs, or girders.  Do not drive unless your doctor says you may.  3. If you have any warning that you may have a seizure, lay down in a safe place where you can't hurt yourself.    4.  No driving for 6 months from last seizure, as per [facility].   Please refer to the following link on the [facility] website for more information: http://[email or web address].org/answerplace/[email or web address]/driving/drivingu.cfm   5.  Maintain good sleep hygiene. Avoid alcohol.  6.  Contact your doctor if you have any problems that may be related to the medicine you are taking.  7.  Call 911 and bring the patient back to the ED if:        A.  The seizure lasts longer than 5 minutes.       B.  The patient doesn't awaken shortly after the seizure  C.  The patient has new problems such as difficulty seeing, speaking or moving  D.  The patient was injured during the seizure  E.  The patient has a temperature over 102 F (39C)  F.  The patient vomited and now is having trouble breathing

## 2019-06-07 NOTE — Progress Notes (Addendum)
NEUROLOGY CONSULTATION NOTE  Tiffany [name] MRN: [id number] DOB: May 15, 1950  Referring provider: Reesa Chew, PA-C Primary care provider: Dr. Billey Chang  Reason for consult:  seizures   Thank you for your kind referral of Tiffany [name] for consultation of the above symptoms. Although her history is well known to you, please allow me to reiterate it for the purpose of our medical record. The patient was accompanied to the clinic by her husband who also provides collateral information. Records and images were personally reviewed where available.  HISTORY OF PRESENT ILLNESS: This is a pleasant 69 year old right-handed woman with a history of bipolar disorder, PTSD, drug-induced parkinsonism, recently admitted for confusion with abnormal EEG, presenting to establish care. She was admitted to [facility] last 04/20/2018 for worsening gait, confusion, and tremors. She contracted COVID and symptoms significantly worsened, leading to a fall. She does not remember a lot of it, she apparently fell and called out to her husband. Her husband reported waxing and waning confusion, seemingly worse at night. She had an MRI brain without contrast which I personally reviewed, no acute changes. 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 CBC showed a WBC of 17.8, acute on chronic renal failure creatinine 2.67. Her Lithium level was 0.87. Lamotrigine level 15.3.Her Synthroid and Lamotrigine were held due to concern this was contributing to tremors/encephalopathy. She had an EEG showing diffuse slowing, at times with triphasic morphology. There were also sharp waves in the left temporoparietal region, maximal at P7/P3, at times periodic for 10-15 seconds without clear evolution and no clinical changes seen. She was restarted on lower dose Lamotrigine 100mg  qhs (previously on 300mg  daily), and low dose Levetiracetam 250mg  BID  was added. Renal failure resolved. Synthroid resumed at lower dose. Sertraline was added by psychiatry initially, then stopped to streamline medications. She had balance deficits and cognitive deficits with delayed processing and poor awareness of deficits.   She has a history of intermittent Lithium toxicity, she has been on this for at least 10 years. 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 She has tried benztropine, trihexyphenidyl, Sinemet, selegiline, Requip, Azilect, with minimal benefit. The tremors had significantly improved with medication changes made in the hospital, she now has minimal tremors. She is happy to report that she can now write legibly. She has a history of tardive dyskinesia and was started on Ingrezza. Initially they felt there was not much benefit after she missed a week due to insurance issues, but they report today that her mouth movements have worsened and would like to resume Ingrezza. She has been mostly dealing with a lot of pain since hospital discharge, she has severe arthritis pain in her feet, knees, wrist/elbow. Tramadol was not helping. She sees Rheumatology and had been on Cimzia which was also held. She reports pain has been daily but she is not in pain today. She still has loss of taste. She had a migraine this morning, she has a remote history of migraines and has not had any in over a year. She used to take Excedrin but had an itching reaction recently. Her husband denies any staring/unresponsive episodes. She denies any olfactory/gustatory hallucinations, deja vu, rising epigastric sensation, focal numbness/tingling/weakness, myoclonic jerks. She denies any dizziness, diplopia, dysarthria/dysphagia, focal numbness/tingling/weakness, bowel/bladder dysfunction. She has lorazepam 1mg  qhs and takes it as needed during the day for anxiety. She reports depression is  better, she denies any suicidal ideation.  Epilepsy Risk  Factors:  Her mother had seizures. Otherwise she had a normal birth and early development.  There is no history of febrile convulsions, CNS infections such as meningitis/encephalitis, significant traumatic brain injury, neurosurgical procedures   PAST MEDICAL HISTORY: Past Medical History:  Diagnosis Date  . Bipolar 1 disorder ([place])   . Breast cancer ([place])   . Chronic diarrhea    loose stools twice a day on average for years  . Chronic kidney disease (CKD), stage III (moderate)   . Depression 1987  . Malignant neoplasm of overlapping sites of left breast in female, estrogen receptor positive ([place]) 03/14/2016   Dx in 09/2014, s/p bilateral mastectomies and ALND, 0/10 LN. 1.4 cm  Grade I invasive lobular, ER and PR +/Her--, Ki 67 <5% Tried anastrozole for one month, but developed suicidal idea  . Parkinson's disease ([place]) 2012  . Psoriatic arthritis ([place])   . PTSD (post-traumatic stress disorder)   . Secondary hyperparathyroidism ([place])   . Tardive dyskinesia     PAST SURGICAL HISTORY: Past Surgical History:  Procedure Laterality Date  . ABDOMINAL HYSTERECTOMY  1987  . CHOLECYSTECTOMY  1979  . DILATION AND CURETTAGE OF UTERUS  1973  . MASTECTOMY Bilateral 09/19/2014  . TONSILLECTOMY  1970  . URETERAL REIMPLANTION Bilateral 1974    MEDICATIONS: Current Outpatient Medications on File Prior to Visit  Medication Sig Dispense Refill  . acetaminophen (TYLENOL) 500 MG tablet Take 500 mg by mouth daily as needed for headache (pain).     Marland Kitchen amLODipine (NORVASC) 2.5 MG tablet Take 1 tablet (2.5 mg total) by mouth daily. 90 tablet 3  . calcitRIOL (ROCALTROL) 0.25 MCG capsule Take 0.25 mcg by mouth every Monday, Wednesday, and Friday.     . Cholecalciferol 50 MCG (2000 UT) TABS Take 1 tablet (2,000 Units total) by mouth See admin instructions. Take 1 tablet by mouth daily 30 tablet 0  . lamoTRIgine (LAMICTAL) 100 MG tablet Take 1 tablet (100 mg total) by mouth at bedtime. 30 tablet 0  .  levETIRAcetam (KEPPRA) 250 MG tablet TAKE 1 TABLET(250 MG) BY MOUTH TWICE DAILY 180 tablet 0  . levothyroxine (SYNTHROID) 112 MCG tablet Take 1 tablet (112 mcg total) by mouth daily at 6 (six) AM. 90 tablet 3  . loperamide (IMODIUM) 2 MG capsule Take 1 capsule (2 mg total) by mouth as needed for diarrhea or loose stools. 30 capsule 0  . LORazepam (ATIVAN) 1 MG tablet Take 1 tablet (1 mg total) by mouth 2 (two) times daily. 60 tablet 2  . sodium bicarbonate 650 MG tablet Take 1 tablet (650 mg total) by mouth 2 (two) times daily. 60 tablet 0   No current facility-administered medications on file prior to visit.    ALLERGIES: Allergies  Allergen Reactions  . Aripiprazole Other (See Comments)    Parkinsonism     . Lactose Intolerance (Gi) Diarrhea  . Methotrexate Other (See Comments)    Hair loss, severe stomatitis    . Cefdinir Diarrhea    Other reaction(s): Diarrhea Yeast infection and fever; negative c diff  . Etanercept Other (See Comments)    Headaches    . Exemestane Other (See Comments)    Suicidal thoughts with medication    . Fluoxetine Other (See Comments)    Parkinsonism  . Methylprednisolone Sodium Succ Other (See Comments)    Agitated mania  . Epinephrine Palpitations    tachycardia   . Nitrofurantoin Nausea And Vomiting  and Rash         FAMILY HISTORY: Family History  Problem Relation Age of Onset  . Cancer Mother   . Cancer Sister   . Stroke Maternal Grandfather   . Diabetes Paternal Grandfather   . Cancer Sister     SOCIAL HISTORY: Social History   Socioeconomic History  . Marital status: Married    Spouse name: Not on file  . Number of children: Not on file  . Years of education: Not on file  . Highest education level: Not on file  Occupational History  . Not on file  Tobacco Use  . Smoking status: Never Smoker  . Smokeless tobacco: Never Used  Substance and Sexual Activity  . Alcohol use: Never  . Drug use: Never  . Sexual activity:  Not Currently  Other Topics Concern  . Not on file  Social History Narrative   Moved to area from [place] 08/2018   Social Determinants of Health   Financial Resource Strain:   . Difficulty of Paying Living Expenses:   Food Insecurity:   . Worried About Charity fundraiser in the Last Year:   . Arboriculturist in the Last Year:   Transportation Needs:   . Film/video editor (Medical):   Marland Kitchen Lack of Transportation (Non-Medical):   Physical Activity:   . Days of Exercise per Week:   . Minutes of Exercise per Session:   Stress:   . Feeling of Stress :   Social Connections:   . Frequency of Communication with Friends and Family:   . Frequency of Social Gatherings with Friends and Family:   . Attends Religious Services:   . Active Member of Clubs or Organizations:   . Attends Archivist Meetings:   Marland Kitchen Marital Status:   Intimate Partner Violence:   . Fear of Current or Ex-Partner:   . Emotionally Abused:   Marland Kitchen Physically Abused:   . Sexually Abused:     REVIEW OF SYSTEMS: Constitutional: No fevers, chills, or sweats, no generalized fatigue, change in appetite Eyes: No visual changes, double vision, eye pain Ear, nose and throat: No hearing loss, ear pain, nasal congestion, sore throat Cardiovascular: No chest pain, palpitations Respiratory:  No shortness of breath at rest or with exertion, wheezes GastrointestinaI: No nausea, vomiting, diarrhea, abdominal pain, fecal incontinence Genitourinary:  No dysuria, urinary retention or frequency Musculoskeletal:  + neck pain, back pain Integumentary: No rash, pruritus, skin lesions Neurological: as above Psychiatric: + depression, anxiety Endocrine: No palpitations, fatigue, diaphoresis, mood swings, change in appetite, change in weight, increased thirst Hematologic/Lymphatic:  No anemia, purpura, petechiae. Allergic/Immunologic: no itchy/runny eyes, nasal congestion, recent allergic reactions, rashes  PHYSICAL  EXAM: Vitals:   06/07/19 1313  BP: (!) 141/91  Pulse: 68  SpO2: 99%   General: No acute distress Head:  Normocephalic/atraumatic Skin/Extremities: No rash, no edema Neurological Exam: Mental status: alert and oriented to person, place, and time, no dysarthria or aphasia, Fund of knowledge is appropriate.  Recent and remote memory are intact.  Attention and concentration are normal.    Able to name objects and repeat phrases.  MMSE - Mini Mental State Exam 06/07/2019  Orientation to time 4  Orientation to Place 5  Registration 3  Attention/ Calculation 5  Recall 3  Language- name 2 objects 2  Language- repeat 1  Language- follow 3 step command 3  Language- read & follow direction 1  Write a sentence 1  Copy design 1  Total score 29   Cranial nerves: CN I: not tested CN II: pupils equal, round and reactive to light, visual fields intact CN III, IV, VI:  full range of motion, no nystagmus, no ptosis CN V: facial sensation intact CN VII: upper and lower face symmetric CN VIII: hearing intact to conversation CN IX, X: gag intact, uvula midline CN XI: sternocleidomastoid and trapezius muscles intact CN XII: tongue midline Bulk & Tone: normal, no fasciculations, no cogwheeling Motor: 5/5 throughout with no pronator drift. Sensation: intact to light touch, cold, pin, vibration and joint position sense.  No extinction to double simultaneous stimulation.  Romberg test negative Deep Tendon Reflexes: +2 throughout, no ankle clonus Cerebellar: no incoordination on finger to nose testing Gait: narrow-based and steady Tremor: no tremor in office today. She has tardive dyskinesia with chewing mouth movements  IMPRESSION: This is a pleasant 69 year old right-handed woman with a history of bipolar disorder, PTSD, drug-induced parkinsonism, recently discharged from the hospital for newly diagnosed epilepsy when she presented with confusion and metabolic derangements (renal failure,  hypothyroidism) with EEG showing left temporoparietal sharp waves. MRI showed chronic left frontal infarct. She is on low dose Levetiracetam 250mg  BID in addition to Lamotrigine 100mg  qhs for mood. Medications were streamlined in the hospital, she has had significant improvement in cognition, as well as significant improvement in drug-induced tremors that have been ongoing for several years. MMSE today 29/30. Exam shows tardive dyskinesia, she would like to resume Ingrezza, no contraindication from a neurologic standpoint, discuss with Psychiatry. She would also like to resume Cimzia, we discussed with her significant pain, benefits outweigh risks, discuss restarting with Rheumatology. We discussed avoiding Tramadol for pain, as this may lower seizure threshold. She was offered Pain Management referral, but declined. [place] driving laws were discussed with the patient, and she knows to stop driving after a seizure, until 6 months seizure-free. Follow-up in 6 months, they know to call for any changes.    Thank you for allowing me to participate in the care of this patient. Please do not hesitate to call for any questions or concerns.   Ellouise Newer, M.D.  CC: Dr. Gavin Pound [facility] Rheum): Marella Chimes, [place], Dr. Jonni Sanger  Addendum 10/13/2019: Correct date for admission was 04/21/2019

## 2019-06-08 ENCOUNTER — Ambulatory Visit: Payer: Medicare Other | Admitting: Speech Pathology

## 2019-06-08 DIAGNOSIS — R2681 Unsteadiness on feet: Secondary | ICD-10-CM | POA: Diagnosis not present

## 2019-06-08 DIAGNOSIS — R41841 Cognitive communication deficit: Secondary | ICD-10-CM

## 2019-06-08 LAB — COMPREHENSIVE METABOLIC PANEL
Albumin: 4 (ref 3.5–5.0)
Calcium: 9.7 (ref 8.7–10.7)
GFR calc Af Amer: 28
GFR calc non Af Amer: 24

## 2019-06-08 LAB — BASIC METABOLIC PANEL
BUN: 29 — AB (ref 4–21)
CO2: 26 — AB (ref 13–22)
Chloride: 105 (ref 99–108)
Creatinine: 2 — AB (ref 0.5–1.1)
Glucose: 95
Potassium: 4.6 (ref 3.4–5.3)
Sodium: 138 (ref 137–147)

## 2019-06-08 NOTE — Patient Instructions (Signed)
Do a brain activity for 30 minutes every day. Some suggestions:  Reading, puzzles, or card games with your grandkids. Look up a recipe in a book or online and print it, and make grocery list. If cooking, gather all supplies (ingredients, tools and utensils) prior to beginning. Use your list as a checklist, mark each step as you complete it.  Make a to-do list for each day at the beginning of the day or the night before. Check things off as you go along. Look at your calendar when you plan your to-do list.  Check your calendar at least twice a day, morning and evening. Read or listen to a news story for 15-20 minutes and then have a conversation with Tiffany [name] to tell him about what you read). While you listen or read, you can take notes to help you remember.   App called Talk[employer] [facility] with activities for language and cognition (free).  Log in: [name]  Password: therapy Click on "Start Working" to see tasks assigned to you.

## 2019-06-08 NOTE — Therapy (Signed)
[facility] [address] [place], [name], [address] Phone: [phone]   Fax:  [phone]  Speech Language Pathology Treatment  Patient Details  Name: Tiffany [name] MRN: [id number] Date of Birth: 25-Aug-1950 No data recorded  Encounter Date: 06/08/2019  End of Session - 06/08/19 1305    Visit Number  3    Number of Visits  17    Date for SLP Re-Evaluation  08/23/19   90 days   SLP Start Time  1150    SLP Stop Time   1230    SLP Time Calculation (min)  40 min    Activity Tolerance  Patient tolerated treatment well       Past Medical History:  Diagnosis Date  . Bipolar 1 disorder ([place])   . Breast cancer ([place])   . Chronic diarrhea    loose stools twice a day on average for years  . Chronic kidney disease (CKD), stage III (moderate)   . Depression 1987  . Malignant neoplasm of overlapping sites of left breast in female, estrogen receptor positive ([place]) 03/14/2016   Dx in 09/2014, s/p bilateral mastectomies and ALND, 0/10 LN. 1.4 cm  Grade I invasive lobular, ER and PR +/Her--, Ki 67 <5% Tried anastrozole for one month, but developed suicidal idea  . Parkinson's disease ([place]) 2012  . Psoriatic arthritis ([place])   . PTSD (post-traumatic stress disorder)   . Secondary hyperparathyroidism ([place])   . Tardive dyskinesia     Past Surgical History:  Procedure Laterality Date  . ABDOMINAL HYSTERECTOMY  1987  . CHOLECYSTECTOMY  1979  . DILATION AND CURETTAGE OF UTERUS  1973  . MASTECTOMY Bilateral 09/19/2014  . TONSILLECTOMY  1970  . URETERAL REIMPLANTION Bilateral 1974    There were no vitals filed for this visit.  Subjective Assessment - 06/08/19 1153    Subjective  "Some drastic changes."    Patient is accompained by:  Family member   [name]- husband           ADULT SLP TREATMENT - 06/08/19 1258      General Information   Behavior/Cognition  Alert;Cooperative;Pleasant mood      Treatment Provided    Treatment provided  Cognitive-Linquistic      Pain Assessment   Pain Assessment  No/denies pain      Cognitive-Linquistic Treatment   Treatment focused on  Cognition    Skilled Treatment  SLP discussed goals and plan of care with pt as she reports now she is doing things she didn't know she could do prior to changes in her medications. Pt stated her priority is physical deficits at this time, and that she finds it difficult to attend 3 appointments in a row. SLP, pt and husband decided to adjust pt's schedule to allow her to focus on PT/OT for the next 2 weeks. Pt and husband will consider her cognitive goals and we will reevaluate on 06/24/19. Therapist worked with pt and husband to ID cognitive activities pt can complete at home during this time (see pt instructions). Also established cognitive home program on TalkPath Therapy app with customized tasks (cognition) for pt and advised pt do 30 minutes of cognitive activities daily (either on app or from list we generated).       Assessment / Recommendations / Plan   Plan  Other (Comment)   hold ST for 2 weeks as pt wants to focus on OT/PT     Progression Toward Goals   Progression toward goals  Progressing toward goals       SLP Education - 06/08/19 1303    Education Details  difficult to determine "baseline" however ST will work with pt and adjust goals as needed depending on her priorities, cognitive activities for home    Person(s) Educated  Patient;Spouse    Methods  Explanation;Handout    Comprehension  Verbalized understanding       SLP Short Term Goals - 06/08/19 1308      SLP SHORT TERM GOAL #1   Title  pt will demo WNL organization in simple - mod complex communicative tasks in 3 sessions with modified independence    Time  3    Period  Weeks    Status  On-going      SLP SHORT TERM GOAL #2   Title  pt will demo error awareness in cognitve linguistic tasks in the therapy session with occaisonal min A in 3 sessions    Time   3    Period  Weeks    Status  On-going      SLP SHORT TERM GOAL #3   Title  pt will bring a memory compensation device/item to therapy in 5 sessions    Time  3    Period  Weeks    Status  On-going      SLP SHORT TERM GOAL #4   Title  pt will demo selective attention in 5 minutes min-mod noisy environment with rare min A back to task over 3 sessions    Time  3    Period  Weeks    Status  New       SLP Long Term Goals - 06/08/19 1309      SLP LONG TERM GOAL #1   Title  Pt will use a memory compensation device to track meds, appointments, errands, recent events, etc with min nonverbal cues in 8 sessions    Time  7    Period  Weeks   or 17 sessions, for all LTGs   Status  On-going      SLP LONG TERM GOAL #2   Title  pt will demo [facility] organization skills for mod complex cogntive linguistic tasks with extra time allowed in 3 sessions    Time  7    Period  Weeks    Status  On-going      SLP LONG TERM GOAL #3   Title  pt will demonstrate anticipatory awareness by spontaneously double checking a detailed written or auditory task in 5 therapy sessions    Time  7    Period  Weeks    Status  On-going      SLP LONG TERM GOAL #4   Title  pt will demo [facility] organization in mod complex communicative tasks in 3 sessions with modified independence    Time  7    Period  Weeks    Status  On-going      SLP LONG TERM GOAL #5   Title  pt will demo simple divided attention in 8 minutes simple-mod complex tasks over three sessions    Time  7    Period  Weeks    Status  New       Plan - 06/08/19 1306    Clinical Impression Statement  Pt presents with mild cognitive communication deficits as measured by Cognitive Linguistic Quick Test (CLQT). Pt demonstrated difficulty with emergent awareness, memory, attention and attention to detail, and executive function skills such as problem solving, self  monitoring and self correction. As pt had suspected seizures, and bouts of lithium toxicity prior  to current CVA, pt's exact premorbid deficts other than memory are somewhat unknown. At this time based on conversation with pt/husband re; goals, pt would like to hold therapy for 2 weeks to focus on PT/OT. We will revisit goals and make adjustments to plan as needed during her next ST visit on 06/24/19. Worked with pt and husband to generate list of cognitive activities and create cognitive home program for pt to complete during the next 2 weeks. SLP believes pt will benefit from skilled ST targeting cognitive communication deficits to maximize pt's skills to commnicate and interact in the home and community.    Speech Therapy Frequency  2x / week   hold for 2 weeks   Duration  --   8 weeks or 17 total sessions   Treatment/Interventions  Environmental controls;Functional tasks;Compensatory techniques;SLP instruction and feedback;Cueing hierarchy;Cognitive reorganization;Internal/external aids;Patient/family education    Potential to Achieve Goals  Good    Potential Considerations  Previous level of function    Consulted and Agree with Plan of Care  Patient;Family member/caregiver    Family Member Consulted  husband       Patient will benefit from skilled therapeutic intervention in order to improve the following deficits and impairments:   Cognitive communication deficit    Problem List Patient Active Problem List   Diagnosis Date Noted  . Chronic diarrhea 05/05/2019  . Bipolar I disorder, most recent episode depressed ([place])   . Transaminitis   . Essential hypertension   . Seizures ([place])   . Encephalopathy 04/26/2019  . Leukocytosis 04/21/2019  . Thrombocytopenia ([place]) 09/04/2018  . Vitamin D deficiency 09/04/2018  . Psoriatic arthritis ([place]) 09/04/2018  . Tubular adenoma of colon 05/28/2017  . 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 Malignant neoplasm of overlapping sites of left breast in female, estrogen receptor positive ([place]) 03/14/2016   . Chronic kidney disease, stage III (moderate) 01/19/2016  . Tardive dyskinesia 10/18/2015  . History of breast cancer left 2016 12/27/2014  . Osteopenia determined by x-ray 10/31/2014  . Rosacea 05/21/2007  . Bipolar affective disorder, mixed ([place]) 08/16/2004  . Acquired hypothyroidism 04/03/1996   Deneise Lever, [place], [place] Speech-Language Pathologist  Aliene Altes 06/08/2019, 1:09 PM  [facility] [address] [place], [name], [address] Phone: [phone]   Fax:  [phone]   Name: Tiffany [name] MRN: [id number] Date of Birth: 06/18/1950

## 2019-06-09 ENCOUNTER — Ambulatory Visit: Payer: Medicare Other | Admitting: Adult Health

## 2019-06-09 ENCOUNTER — Ambulatory Visit (INDEPENDENT_AMBULATORY_CARE_PROVIDER_SITE_OTHER): Payer: Medicare Other | Admitting: Adult Health

## 2019-06-09 ENCOUNTER — Encounter: Payer: Self-pay | Admitting: Adult Health

## 2019-06-09 ENCOUNTER — Ambulatory Visit: Payer: Medicare Other | Admitting: Psychology

## 2019-06-09 ENCOUNTER — Other Ambulatory Visit: Payer: Self-pay

## 2019-06-09 DIAGNOSIS — F319 Bipolar disorder, unspecified: Secondary | ICD-10-CM | POA: Diagnosis not present

## 2019-06-09 DIAGNOSIS — F411 Generalized anxiety disorder: Secondary | ICD-10-CM

## 2019-06-09 DIAGNOSIS — F4310 Post-traumatic stress disorder, unspecified: Secondary | ICD-10-CM | POA: Diagnosis not present

## 2019-06-09 DIAGNOSIS — F331 Major depressive disorder, recurrent, moderate: Secondary | ICD-10-CM | POA: Diagnosis not present

## 2019-06-09 DIAGNOSIS — G4700 Insomnia, unspecified: Secondary | ICD-10-CM

## 2019-06-09 NOTE — Progress Notes (Signed)
Tiffany [name] [id number] 1950-12-24 69 y.o.  Subjective:   Patient ID:  Tiffany [name] is a 69 y.o. (DOB 1950/08/31) female.  Chief Complaint: No chief complaint on file.   HPI Kymia Simi presents to the office today for follow-up of PTSD, insomnia,GAD, BPD 1.  Describes mood today as "ok". Pleasant. Mood symptoms - not feeling as depressed, anxious and irritable. Stating "it's a day to day thing". Reports "weakness and pain". Neurologist doesn't want her taking pain medication. She has received a referral to pain management. Does not want to add another provider to the list she already has. Working with P/T and O/T. Would like to utilize the times of feeling good to do things she wants to do. Reports decreased "outbursts". Trying to "pace herself with food and activity". Feels like mood is better when she "takes care of her bodyf". Minimal amount of hand tremors - able to write legibly again. Improved interest and motivation. Taking medications as prescribed. Working with therapist - Administrator.  Energy levels improving. Active, does not have a regular exercise routine. Disabled.  Enjoys some usual interests and activities. Married. Lives with husband of 93 years. Talking to family members.  Appetite adequate - taste coming back. Weight loss - 35 pounds total.. Sleeps better at night with increase in Ativan. Averages 6 to 8 hours. Focus and concentration improved. Completing tasks. Managing some aspects of household.  Denies SI or HI. Denies AH or VH.   Previous medications: Celexa, Zyprexa, Tegretol, Depakote, Serzone, Topamax, Seroquel, Effexor, Lexapro, Desipramine, Neurontin, Abilify, Geodon, Propanolol, Cymbalta, Cogentin, Trihexyphenadyl, Simet, Provigil, Selegiline, Requip, Amantadine, Prozac, Mirapex, Azilect, Metoclopramide, Baclofen, Artane, Namenda, Latuda.  GAD-7     Office Visit from 05/14/2019 in [place]  Total GAD-7 Score  21    Mini-Mental     Office  Visit from 06/07/2019 in [place]  Total Score (max 30 points )  29    PHQ2-9     Office Visit from 05/14/2019 in [place]  PHQ-2 Total Score  1  PHQ-9 Total Score  8      Review of Systems:  Review of Systems  Musculoskeletal: Negative for gait problem.  Neurological: Negative for tremors.  Psychiatric/Behavioral:       Please refer to HPI    Medications: I have reviewed the patient's current medications.  Current Outpatient Medications  Medication Sig Dispense Refill  . acetaminophen (TYLENOL) 500 MG tablet Take 500 mg by mouth daily as needed for headache (pain).     Marland Kitchen amLODipine (NORVASC) 2.5 MG tablet Take 1 tablet (2.5 mg total) by mouth daily. 90 tablet 3  . calcitRIOL (ROCALTROL) 0.25 MCG capsule Take 0.25 mcg by mouth every Monday, Wednesday, and Friday.     . Cholecalciferol 50 MCG (2000 UT) TABS Take 1 tablet (2,000 Units total) by mouth See admin instructions. Take 1 tablet by mouth daily 30 tablet 0  . lamoTRIgine (LAMICTAL) 100 MG tablet Take 1 tablet (100 mg total) by mouth at bedtime. 30 tablet 0  . levETIRAcetam (KEPPRA) 250 MG tablet TAKE 1 TABLET(250 MG) BY MOUTH TWICE DAILY 180 tablet 3  . levothyroxine (SYNTHROID) 112 MCG tablet Take 1 tablet (112 mcg total) by mouth daily at 6 (six) AM. 90 tablet 3  . loperamide (IMODIUM) 2 MG capsule Take 1 capsule (2 mg total) by mouth as needed for diarrhea or loose stools. 30 capsule 0  . LORazepam (ATIVAN) 1 MG tablet Take 1 tablet (1 mg  total) by mouth 2 (two) times daily. 60 tablet 2  . sodium bicarbonate 650 MG tablet Take 1 tablet (650 mg total) by mouth 2 (two) times daily. 60 tablet 0   No current facility-administered medications for this visit.    Medication Side Effects: None  Allergies:  Allergies  Allergen Reactions  . Aripiprazole Other (See Comments)    Parkinsonism     . Lactose Intolerance (Gi) Diarrhea  . Methotrexate Other (See Comments)    Hair loss,  severe stomatitis    . Cefdinir Diarrhea    Other reaction(s): Diarrhea Yeast infection and fever; negative c diff  . Etanercept Other (See Comments)    Headaches    . Exemestane Other (See Comments)    Suicidal thoughts with medication    . Fluoxetine Other (See Comments)    Parkinsonism  . Methylprednisolone Sodium Succ Other (See Comments)    Agitated mania  . Epinephrine Palpitations    tachycardia   . Nitrofurantoin Nausea And Vomiting and Rash         Past Medical History:  Diagnosis Date  . Bipolar 1 disorder ([place])   . Breast cancer ([place])   . Chronic diarrhea    loose stools twice a day on average for years  . Chronic kidney disease (CKD), stage III (moderate)   . Depression 1987  . Malignant neoplasm of overlapping sites of left breast in female, estrogen receptor positive ([place]) 03/14/2016   Dx in 09/2014, s/p bilateral mastectomies and ALND, 0/10 LN. 1.4 cm  Grade I invasive lobular, ER and PR +/Her--, Ki 67 <5% Tried anastrozole for one month, but developed suicidal idea  . Parkinson's disease ([place]) 2012  . Psoriatic arthritis ([place])   . PTSD (post-traumatic stress disorder)   . Secondary hyperparathyroidism ([place])   . Tardive dyskinesia     Family History  Problem Relation Age of Onset  . Cancer Mother   . Cancer Sister   . Stroke Maternal Grandfather   . Diabetes Paternal Grandfather   . Cancer Sister     Social History   Socioeconomic History  . Marital status: Married    Spouse name: Not on file  . Number of children: Not on file  . Years of education: Not on file  . Highest education level: Not on file  Occupational History  . Not on file  Tobacco Use  . Smoking status: Never Smoker  . Smokeless tobacco: Never Used  Substance and Sexual Activity  . Alcohol use: Never  . Drug use: Never  . Sexual activity: Not Currently  Other Topics Concern  . Not on file  Social History Narrative   Moved to area from [place] 08/2018   Lives one  story home   Right handed.   Social Determinants of Health   Financial Resource Strain:   . Difficulty of Paying Living Expenses:   Food Insecurity:   . Worried About Charity fundraiser in the Last Year:   . Arboriculturist in the Last Year:   Transportation Needs:   . Film/video editor (Medical):   Marland Kitchen Lack of Transportation (Non-Medical):   Physical Activity:   . Days of Exercise per Week:   . Minutes of Exercise per Session:   Stress:   . Feeling of Stress :   Social Connections:   . Frequency of Communication with Friends and Family:   . Frequency of Social Gatherings with Friends and Family:   . Attends Religious Services:   .  Active Member of Clubs or Organizations:   . Attends Archivist Meetings:   Marland Kitchen Marital Status:   Intimate Partner Violence:   . Fear of Current or Ex-Partner:   . Emotionally Abused:   Marland Kitchen Physically Abused:   . Sexually Abused:     Past Medical History, Surgical history, Social history, and Family history were reviewed and updated as appropriate.   Please see review of systems for further details on the patient's review from today.   Objective:   Physical Exam:  There were no vitals taken for this visit.  Physical Exam Constitutional:      General: She is not in acute distress. Musculoskeletal:        General: No deformity.  Neurological:     Mental Status: She is alert and oriented to person, place, and time.     Coordination: Coordination normal.  Psychiatric:        Attention and Perception: Attention and perception normal. She does not perceive auditory or visual hallucinations.        Mood and Affect: Mood normal. Mood is not anxious or depressed. Affect is not labile, blunt, angry or inappropriate.        Speech: Speech normal.        Behavior: Behavior normal.        Thought Content: Thought content normal. Thought content is not paranoid or delusional. Thought content does not include homicidal or suicidal ideation.  Thought content does not include homicidal or suicidal plan.        Cognition and Memory: Cognition and memory normal.        Judgment: Judgment normal.     Comments: Insight intact     Lab Review:     Component Value Date/Time   NA 142 05/03/2019 0553   NA 143 09/29/2018 0000   K 4.0 05/03/2019 0553   CL 107 05/03/2019 0553   CO2 24 05/03/2019 0553   GLUCOSE 98 05/03/2019 0553   BUN 13 05/03/2019 0553   BUN 23 (A) 09/29/2018 0000   CREATININE 1.86 (H) 05/03/2019 0553   CALCIUM 9.0 05/03/2019 0553   PROT 5.9 (L) 04/30/2019 0519   ALBUMIN 3.0 (L) 04/30/2019 0519   AST 34 04/30/2019 0519   ALT 57 (H) 04/30/2019 0519   ALKPHOS 93 04/30/2019 0519   BILITOT 0.4 04/30/2019 0519   GFRNONAA 27 (L) 05/03/2019 0553   GFRAA 31 (L) 05/03/2019 0553       Component Value Date/Time   WBC 8.3 05/03/2019 0553   RBC 3.91 05/03/2019 0553   HGB 11.7 (L) 05/03/2019 0553   HCT 37.8 05/03/2019 0553   PLT 177 05/03/2019 0553   MCV 96.7 05/03/2019 0553   MCH 29.9 05/03/2019 0553   MCHC 31.0 05/03/2019 0553   RDW 14.3 05/03/2019 0553   LYMPHSABS 2.5 04/27/2019 0544   MONOABS 0.5 04/27/2019 0544   EOSABS 0.2 04/27/2019 0544   BASOSABS 0.0 04/27/2019 0544    Lithium Lvl  Date Value Ref Range Status  04/21/2019 0.79 0.60 - 1.20 mmol/L Final    Comment:    Performed at [facility], [place] [address], [place], [place] [address]     No results found for: PHENYTOIN, PHENOBARB, VALPROATE, CBMZ   .res Assessment: Plan:    Plan:  Lamictal 100mg  at hs Lorazepam 1mg  BID for anxiety  Also taking Keppra 250mg  BID for seizures  Therapist - [name]   RTC 4 weeks  Discussed potential benefits, risk, and  side effects of benzodiazepines to include potential risk of tolerance and dependence, as well as possible drowsiness.  Advised patient not to drive if experiencing drowsiness and to take lowest possible effective dose to minimize risk of dependence and tolerance.  Counseled  patient regarding potential benefits, risks, and side effects of Lamictal to include potential risk of Stevens-Johnson syndrome. Advised patient to stop taking Lamictal and contact office immediately if rash develops and to seek urgent medical attention if rash is severe and/or spreading quickly.  There are no diagnoses linked to this encounter.   Please see After Visit Summary for patient specific instructions.  Future Appointments  Date Time Provider [place]  06/09/2019 10:00 AM Karinna Beadles, Berdie Ogren, NP [facility] None  06/10/2019 10:15 AM Arliss Journey, PT [facility]  06/15/2019  9:30 AM Rine, Selmer Dominion, OT [facility]  06/15/2019 10:15 AM Frazier Butt, PT [facility]  06/16/2019 10:45 AM Edrick Kins, DPM [facility]  06/16/2019  1:00 PM Cottle, Bambi G, LCSW [facility] None  06/18/2019 10:15 AM Rine, Selmer Dominion, OT [facility]  06/18/2019 11:00 AM Frazier Butt, PT [facility]  06/22/2019 10:15 AM Rine, Selmer Dominion, OT [facility]  06/22/2019 11:00 AM Frazier Butt, PT [facility]  06/24/2019  9:30 AM Rine, Selmer Dominion, OT [facility]  06/24/2019 10:15 AM Aliene Altes, CCC-SLP [facility]  06/24/2019 11:00 AM Frazier Butt, PT [facility]  06/25/2019  1:00 PM Leamon Arnt, MD [facility]  06/29/2019  9:30 AM Frazier Butt, PT [facility]  06/29/2019 10:15 AM Valentino Saxon, [place], CCC-SLP [facility]  06/29/2019 11:00 AM Rine, Selmer Dominion, OT [facility]  06/30/2019  1:00 PM Cottle, Bambi G, LCSW [facility] None  07/02/2019  9:30 AM Rine, Selmer Dominion, OT [facility]  07/02/2019 10:15 AM Frazier Butt, PT [facility]  07/02/2019 11:00 AM Valentino Saxon, [place], CCC-SLP [facility]  07/06/2019  9:30 AM Frazier Butt, PT [facility]  07/06/2019 10:15 AM Valentino Saxon, [place], CCC-SLP [facility]  07/06/2019 11:00 AM Hulda Marin, OT [facility]  07/09/2019  9:30 AM Frazier Butt, PT [facility]  07/09/2019 10:15 AM Rine, Selmer Dominion, OT  [facility]  07/09/2019 11:00 AM Sharen Counter, CCC-SLP [facility]  07/13/2019  9:30 AM Frazier Butt, PT [facility]  07/13/2019 10:15 AM Rine, Selmer Dominion, OT [facility]  07/13/2019 11:00 AM Sharen Counter, CCC-SLP [facility]  07/16/2019  9:30 AM Arliss Journey, PT [facility]  07/16/2019 10:15 AM Rine, Selmer Dominion, OT [facility]  07/16/2019 11:00 AM Cori Razor [facility]  07/20/2019 10:15 AM Sharen Counter CCC-SLP [facility]  07/23/2019 10:15 AM Claudette Laws [facility]  07/27/2019 10:15 AM Claudette Laws [facility]  07/30/2019 10:15 AM Claudette Laws [facility]  12/14/2019 11:30 AM Cameron Sprang, MD [facility] None    No orders of the defined types were placed in this encounter.   -------------------------------

## 2019-06-10 ENCOUNTER — Ambulatory Visit: Payer: Medicare Other | Attending: Family Medicine | Admitting: Physical Therapy

## 2019-06-10 ENCOUNTER — Other Ambulatory Visit: Payer: Self-pay

## 2019-06-10 ENCOUNTER — Encounter: Payer: Self-pay | Admitting: Physical Therapy

## 2019-06-10 DIAGNOSIS — R41841 Cognitive communication deficit: Secondary | ICD-10-CM | POA: Insufficient documentation

## 2019-06-10 DIAGNOSIS — M25611 Stiffness of right shoulder, not elsewhere classified: Secondary | ICD-10-CM | POA: Diagnosis present

## 2019-06-10 DIAGNOSIS — R2681 Unsteadiness on feet: Secondary | ICD-10-CM

## 2019-06-10 DIAGNOSIS — R41844 Frontal lobe and executive function deficit: Secondary | ICD-10-CM | POA: Diagnosis present

## 2019-06-10 DIAGNOSIS — M25612 Stiffness of left shoulder, not elsewhere classified: Secondary | ICD-10-CM | POA: Insufficient documentation

## 2019-06-10 DIAGNOSIS — R278 Other lack of coordination: Secondary | ICD-10-CM | POA: Diagnosis present

## 2019-06-10 DIAGNOSIS — R29818 Other symptoms and signs involving the nervous system: Secondary | ICD-10-CM | POA: Diagnosis present

## 2019-06-10 DIAGNOSIS — M6281 Muscle weakness (generalized): Secondary | ICD-10-CM

## 2019-06-10 DIAGNOSIS — R2689 Other abnormalities of gait and mobility: Secondary | ICD-10-CM | POA: Diagnosis present

## 2019-06-10 NOTE — Therapy (Signed)
[facility] [address] [place], [name], [address] Phone: [phone]   Fax:  [phone]  Physical Therapy Treatment  Patient Details  Name: Tiffany [name] MRN: [id number] Date of Birth: 13-Oct-1950 Referring Provider (PT): Mar[place]e [place], [place]   Encounter Date: 06/10/2019  PT End of Session - 06/10/19 1156    Visit Number  2    Number of Visits  17    Date for PT Re-Evaluation  32/67/12   90 day recert for 8 wk POC   Authorization Type  Medicare/[employer]- will need 10th visit progress note    PT Start Time  1017    PT Stop Time  1100    PT Time Calculation (min)  43 min    Equipment Utilized During Treatment  Gait belt    Activity Tolerance  Patient tolerated treatment well    Behavior During Therapy  [facility] for tasks assessed/performed       Past Medical History:  Diagnosis Date  . Bipolar 1 disorder ([place])   . Breast cancer ([place])   . Chronic diarrhea    loose stools twice a day on average for years  . Chronic kidney disease (CKD), stage III (moderate)   . Depression 1987  . Malignant neoplasm of overlapping sites of left breast in female, estrogen receptor positive ([place]) 03/14/2016   Dx in 09/2014, s/p bilateral mastectomies and ALND, 0/10 LN. 1.4 cm  Grade I invasive lobular, ER and PR +/Her--, Ki 67 <5% Tried anastrozole for one month, but developed suicidal idea  . Parkinson's disease ([place]) 2012  . Psoriatic arthritis ([place])   . PTSD (post-traumatic stress disorder)   . Secondary hyperparathyroidism ([place])   . Tardive dyskinesia     Past Surgical History:  Procedure Laterality Date  . ABDOMINAL HYSTERECTOMY  1987  . CHOLECYSTECTOMY  1979  . DILATION AND CURETTAGE OF UTERUS  1973  . MASTECTOMY Bilateral 09/19/2014  . TONSILLECTOMY  1970  . URETERAL REIMPLANTION Bilateral 1974    There were no vitals filed for this visit.  Subjective Assessment - 06/10/19 1020    Subjective  Pt reports the initial HEP she was  given from OT were too much, caused a lot of pain and immobility. Has had not falls since she was here last. Was given some exercises from inpatient rehab, but only had did them one day.    Patient is accompained by:  Family member    Pertinent History  WPY:[id number], bipolar 1 disorder, psoriatic arthritis, Parkinsonism (medication-induced), COVID-19 positive on 04/03/2019    Patient Stated Goals  Pt's goals for therapy are to improve strength and balance.    Currently in Pain?  No/denies    Pain Onset  1 to 4 weeks ago         [facility] PT Assessment - 06/10/19 1028      Standardized Balance Assessment   Standardized Balance Assessment  Berg Balance Test      Berg Balance Test   Sit to Stand  Able to stand without using hands and stabilize independently    Standing Unsupported  Able to stand safely 2 minutes    Sitting with Back Unsupported but Feet Supported on Floor or Stool  Able to sit safely and securely 2 minutes    Stand to Sit  Sits safely with minimal use of hands    Transfers  Able to transfer safely, definite need of hands    Standing Unsupported with Eyes Closed  Able to stand  10 seconds safely    Standing Unsupported with Feet Together  Able to place feet together independently and stand for 1 minute with supervision    From Standing, Reach Forward with Outstretched Arm  Can reach forward >12 cm safely (5")    From Standing Position, Pick up Object from [place] to pick up shoe safely and easily    From Standing Position, Turn to Look Behind Over each Shoulder  Looks behind one side only/other side shows less weight shift    Turn 360 Degrees  Needs close supervision or verbal cueing   16 SECONDS TO R, 8 seconds to L   Standing Unsupported, Alternately Place Feet on Step/Stool  Able to stand independently and safely and complete 8 steps in 20 seconds    Standing Unsupported, One Foot in [place] to take small step independently and hold 30 seconds    Standing on One  Leg  Able to lift leg independently and hold > 10 seconds   standing on LLE   Total Score  47    Berg comment:  47/56                   [facility] Adult PT Treatment/Exercise - 06/10/19 0001      Transfers   Transfers  Sit to Stand;Stand to Sit    Sit to Stand  With upper extremity assist;5: Supervision;From chair/3-in-1    Sit to Stand Details (indicate cue type and reason)  needed cues for anterior weight shift prior to coming to stand, 2 x 5 reps - noted pt's RLE anteriorly to LLE and pt more reliant on LLE when performing sit <> stands      Ambulation/Gait   Ambulation/Gait  Yes    Ambulation/Gait Assistance  5: Supervision    Ambulation/Gait Assistance Details  briefly ambulated outdoors during session during fire drill over paved and cement surfaces, cues for foot clearance and staying upright in rollator when going up slight incline. when ambulating out of clinic at end of session noticed decr stance time on RLE, pt reporting it felt weaker    Ambulation Distance (Feet)  100 Feet    Assistive device  Rollator    Gait Pattern  Step-through pattern;Shuffle;Trunk flexed;Poor foot clearance - left;Poor foot clearance - right    Ambulation Surface  Unlevel;Outdoor;Paved;Other (comment)   cement     Therapeutic Activites    Therapeutic Activities  Other Therapeutic Activities    Other Therapeutic Activities  pt reports that her R knee has been bothersome for approx. the past 2 weeks and it is something new that wasn't happening when she was in the hospital. She notices it is harder to put weight on her R side and sometimes it feels like it might give out after prolonged standing. Discussed with pt about following up with her PCP regarding her R knee (pt verbalizes understanding and has an appointment coming up within the next couple of weeks).        Access Code: [email or web address] URL: [email or web address] Date: 06/10/2019 Prepared by: Janann August  Initiated HEP  below: verbal and demonstrative cues for proper technique   Exercises Seated Hamstring Stretch - 1 x daily - 4-5 x weekly - 3 sets - 15 hold Seated March - 1 x daily - 4-5 x weekly - 3 sets - 10 reps Sit to Stand with Armchair - 1-2 x daily - 4-5 x weekly - 2 sets - 5 reps Seated Long Arc Quad - 1-2  x daily - 4-5 x weekly - 2 sets - 5 reps Seated Heel Toe Raises - 1 x daily - 4-5 x weekly - 2 sets - 10 reps      PT Education - 06/10/19 1200    Education Details  initial HEP, see TA    Person(s) Educated  Patient    Methods  Explanation;Demonstration;Handout    Comprehension  Verbalized understanding;Returned demonstration       PT Short Term Goals - 06/10/19 1204      PT SHORT TERM GOAL #1   Title  Pt will perform HEP with family supervision for improved strength, balance, and gait.  TARGET:  06/18/2019    Time  4    Period  Weeks    Status  New      PT SHORT TERM GOAL #2   Title  Pt will improve TUG to less than or equal to 15 seconds for decreased fall risk.    Time  4    Period  Weeks    Status  New      PT SHORT TERM GOAL #3   Title  Berg Balance test to be assessed, with goal to be written as appropriate.    Baseline  scored 47/56 on 06/10/19 - LTG written as appropriate.    Time  4    Period  Weeks    Status  Achieved      PT SHORT TERM GOAL #4   Title  Pt will improve 5x sit<>stand to less than or equal to 18 seconds for improved functional strength.    Time  4    Period  Weeks    Status  New      PT SHORT TERM GOAL #5   Title  Pt/husband will verbalize understanding of fall prevention in home environment.    Time  4    Period  Weeks    Status  New        PT Long Term Goals - 06/10/19 1204      PT LONG TERM GOAL #1   Title  Pt will perform HEP with supervision of family for improved balance, strength, gait.  TARGET 07/16/2019    Time  8    Period  Weeks    Status  New      PT LONG TERM GOAL #2   Title  Pt will improve gait velocity to at least 2  ft/sec for improved gait efficiency and safety in the community.    Time  8    Period  Weeks    Status  New      PT LONG TERM GOAL #3   Title  Pt will improve BERG to at least 50/56 to demo decr fall risk.    Baseline  47/56 on 06/10/19    Time  8    Period  Weeks    Status  Revised      PT LONG TERM GOAL #4   Title  Pt will improve 5x sit<>stand to less than or equal to 15 seconds for improved functional strength.    Time  8    Period  Weeks    Status  New      PT LONG TERM GOAL #5   Title  Pt will ambulate at least 1000 ft, modified independently with rollator, indoor and outdoor surfaces, for return to community mobility.    Time  8    Period  Weeks    Status  New  Plan - 06/10/19 1201    Clinical Impression Statement  Performed the BERG today with pt scoring a 47/56 indicating that pt is at a moderate risk for falls. Pt demonstrating significantly incr time to perform a full 360 degree turn to R (16 seconds) vs. To L (8 seconds). Initiated pt's HEP today for LE strengthening and ROM, pt tolerated well and would prefer to start out doing 5 reps of each exercise due to over-doing it with her OT exercises. Will assess at next session. Pt reporting R discomfort/weakness that has started in the past 2 weeks and states sometimes it feels like it might give out. Therapist noted during session that pt is more reliant on LLE during sit <> stands and had decr stance time on RLE when leaving clinic. Therapist stating to pt to follow up with PCP regarding her R knee, pt verbalized understanding. Will continue to progress towards LTGs.    Personal Factors and Comorbidities  Comorbidity 3+    Comorbidities  See above    Examination-Activity Limitations  Locomotion Level;Transfers;Stand    Examination-Participation Restrictions  Church;Community Activity    Stability/Clinical Decision Making  Evolving/Moderate complexity    Rehab Potential  Good    PT Frequency  2x / week    PT  Duration  8 weeks   plus eval   PT Treatment/Interventions  ADLs/Self Care Home Management;DME Instruction;Neuromuscular re-education;Balance training;Therapeutic activities;Therapeutic exercise;Functional mobility training;Gait training;Patient/family education    PT Next Visit Plan  how was initial HEP? continue with LE strengthening, sit <> stand traning, how is pt's R knee?, gait training with rollator    Consulted and Agree with Plan of Care  Patient       Patient will benefit from skilled therapeutic intervention in order to improve the following deficits and impairments:  Abnormal gait, Decreased balance, Decreased mobility, Decreased strength, Postural dysfunction, Other (comment)(Pain (due to psoriatic arthritis), not to be specifically addressed due to chronic issue)  Visit Diagnosis: Other lack of coordination  Other symptoms and signs involving the nervous system  Other abnormalities of gait and mobility  Unsteadiness on feet  Muscle weakness (generalized)     Problem List Patient Active Problem List   Diagnosis Date Noted  . Chronic diarrhea 05/05/2019  . Bipolar I disorder, most recent episode depressed ([place])   . Transaminitis   . Essential hypertension   . Seizures ([place])   . Encephalopathy 04/26/2019  . Leukocytosis 04/21/2019  . Thrombocytopenia ([place]) 09/04/2018  . Vitamin D deficiency 09/04/2018  . Psoriatic arthritis ([place]) 09/04/2018  . Tubular adenoma of colon 05/28/2017  . 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 Malignant neoplasm of overlapping sites of left breast in female, estrogen receptor positive ([place]) 03/14/2016  . Chronic kidney disease, stage III (moderate) 01/19/2016  . Tardive dyskinesia 10/18/2015  . History of breast cancer left 2016 12/27/2014  . Osteopenia determined by x-ray 10/31/2014  . Rosacea 05/21/2007  . Bipolar affective disorder, mixed ([place]) 08/16/2004  . Acquired hypothyroidism  04/03/1996    Arliss Journey, PT, DPT  06/10/2019, 12:05 PM  [facility] [address] [place], [name], [address] Phone: [phone]   Fax:  [phone]  Name: Tiffany [name] MRN: [id number] Date of Birth: 08/09/1950

## 2019-06-10 NOTE — Patient Instructions (Signed)
Access Code: [email or web address] URL: [email or web address] Date: 06/10/2019 Prepared by: Janann August  Exercises Seated Hamstring Stretch - 1 x daily - 4-5 x weekly - 3 sets - 15 hold Seated March - 1 x daily - 4-5 x weekly - 3 sets - 10 reps Sit to Stand with Armchair - 1-2 x daily - 4-5 x weekly - 2 sets - 5 reps Seated Long Arc Quad - 1-2 x daily - 4-5 x weekly - 2 sets - 5 reps Seated Heel Toe Raises - 1 x daily - 4-5 x weekly - 2 sets - 10 reps

## 2019-06-15 ENCOUNTER — Ambulatory Visit: Payer: Medicare Other | Admitting: Physical Therapy

## 2019-06-15 ENCOUNTER — Ambulatory Visit: Payer: Medicare Other | Admitting: Occupational Therapy

## 2019-06-15 ENCOUNTER — Ambulatory Visit: Payer: Medicare Other

## 2019-06-16 ENCOUNTER — Other Ambulatory Visit: Payer: Self-pay

## 2019-06-16 ENCOUNTER — Ambulatory Visit (INDEPENDENT_AMBULATORY_CARE_PROVIDER_SITE_OTHER): Payer: Medicare Other | Admitting: Podiatry

## 2019-06-16 ENCOUNTER — Ambulatory Visit (INDEPENDENT_AMBULATORY_CARE_PROVIDER_SITE_OTHER): Payer: Medicare Other | Admitting: Psychology

## 2019-06-16 DIAGNOSIS — M2042 Other hammer toe(s) (acquired), left foot: Secondary | ICD-10-CM

## 2019-06-16 DIAGNOSIS — M2041 Other hammer toe(s) (acquired), right foot: Secondary | ICD-10-CM

## 2019-06-16 DIAGNOSIS — L989 Disorder of the skin and subcutaneous tissue, unspecified: Secondary | ICD-10-CM

## 2019-06-16 DIAGNOSIS — F314 Bipolar disorder, current episode depressed, severe, without psychotic features: Secondary | ICD-10-CM | POA: Diagnosis not present

## 2019-06-17 ENCOUNTER — Encounter: Payer: Self-pay | Admitting: Family Medicine

## 2019-06-18 ENCOUNTER — Ambulatory Visit: Payer: Medicare Other | Admitting: Physical Therapy

## 2019-06-18 ENCOUNTER — Ambulatory Visit: Payer: Medicare Other | Admitting: Occupational Therapy

## 2019-06-18 ENCOUNTER — Encounter: Payer: Self-pay | Admitting: Physical Therapy

## 2019-06-18 ENCOUNTER — Other Ambulatory Visit: Payer: Self-pay

## 2019-06-18 DIAGNOSIS — R278 Other lack of coordination: Secondary | ICD-10-CM | POA: Diagnosis not present

## 2019-06-18 DIAGNOSIS — M6281 Muscle weakness (generalized): Secondary | ICD-10-CM

## 2019-06-18 DIAGNOSIS — R41844 Frontal lobe and executive function deficit: Secondary | ICD-10-CM

## 2019-06-18 DIAGNOSIS — M25612 Stiffness of left shoulder, not elsewhere classified: Secondary | ICD-10-CM

## 2019-06-18 DIAGNOSIS — R2689 Other abnormalities of gait and mobility: Secondary | ICD-10-CM

## 2019-06-18 DIAGNOSIS — M25611 Stiffness of right shoulder, not elsewhere classified: Secondary | ICD-10-CM

## 2019-06-18 NOTE — Progress Notes (Signed)
   HPI: 69 y.o. female presenting today status post 4th toe arthroplasty left. DOS: 03/07/2020. She states she is doing well and denies any pain or concerns regarding her surgery. She does report moderate to severe pain to the bilateral 2nd toes that began a few weeks ago. She reports falling which caused the onset of pain. She has been taking Tylenol for treatment. There are no worsening factors noted. Patient is here for further evaluation and treatment.   Past Medical History:  Diagnosis Date  . Bipolar 1 disorder ([place])   . Breast cancer ([place])   . Chronic diarrhea    loose stools twice a day on average for years  . Chronic kidney disease (CKD), stage III (moderate)   . Depression 1987  . Malignant neoplasm of overlapping sites of left breast in female, estrogen receptor positive ([place]) 03/14/2016   Dx in 09/2014, s/p bilateral mastectomies and ALND, 0/10 LN. 1.4 cm  Grade I invasive lobular, ER and PR +/Her--, Ki 67 <5% Tried anastrozole for one month, but developed suicidal idea  . Parkinson's disease ([place]) 2012  . Psoriatic arthritis ([place])   . PTSD (post-traumatic stress disorder)   . Secondary hyperparathyroidism ([place])   . Tardive dyskinesia       Objective: Physical Exam General: The patient is alert and oriented x3 in no acute distress.  Dermatology: Hyperkeratotic lesion(s) present on the bilateral 2nd toes. Pain on palpation with a central nucleated core noted. Skin is cool, dry and supple bilateral lower extremities. Negative for open lesions or macerations.  Vascular: Palpable pedal pulses bilaterally. No edema or erythema noted. Capillary refill within normal limits.  Neurological: Epicritic and protective threshold grossly intact bilaterally.   Musculoskeletal Exam: All pedal and ankle joints range of motion within normal limits bilateral. Muscle strength 5/5 in all groups bilateral. Hammertoe contracture deformity noted to the 2nd digit of the bilateral  feet.  Assessment: 1.  Pre-ulcerative callus lesions noted to bilateral 2nd toes 2. 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 Patient evaluated.  2. Excisional debridement of keratotic lesion(s) using a chisel blade was performed without incident. Light dressing applied.  3. Recommended OTC antibiotic ointment.  4. Return to clinic as needed.     Edrick Kins, DPM [facility]  Dr. Edrick Kins, DPM    [address], [place] [address]                Office [phone]  Fax [phone]

## 2019-06-18 NOTE — Patient Instructions (Signed)
WALKING  Walking is a great form of exercise to increase your strength, endurance and overall fitness.  A walking program can help you start slowly and gradually build endurance as you go.  Everyone's ability is different, so each person's starting point will be different.  You do not have to follow them exactly.  The are just samples. You should simply find out what's right for you and stick to that program.   In the beginning, you'll start off walking 2-3 times a day for short distances.  As you get stronger, you'll be walking further at just 1-2 times per day.  A. You Can Walk For A Certain Length Of Time Each Day    Walk 2 minutes 3 times per day.  Increase 1-2 minutes every 2 days (3 times per day).  Work up to 8-10 minutes (1-2 times per day).   Example:   Day 1-2 2 minutes 3 times per day   Day 7-8 4-6 minutes 3 times per day   Day 13-14 8-10 minutes 1-2 times per day  B. You Can Walk For a Certain Distance Each Day     Distance can be substituted for time.    Example:   2 laps or lengths of the hallway   Then add 1 lap every several days.    Please only do the exercises that your therapist has initialed and dated

## 2019-06-18 NOTE — Therapy (Addendum)
[facility] [address] [place], [name], [address] Phone: [phone]   Fax:  [phone]  Physical Therapy Treatment  Patient Details  Name: Tiffany [name] MRN: [id number] Date of Birth: Dec 30, 1950 Referring Provider (PT): Mar[place]e [place], [place]   Encounter Date: 06/18/2019  PT End of Session - 06/18/19 1634    Visit Number  3    Number of Visits  17    Date for PT Re-Evaluation  09/32/35   90 day recert for 8 wk POC   Authorization Type  Medicare/[employer]- will need 10th visit progress note    PT Start Time  1102    PT Stop Time  1140    PT Time Calculation (min)  38 min    Equipment Utilized During Treatment  Gait belt    Activity Tolerance  Patient tolerated treatment well;Patient limited by fatigue   fatigued by end of session   Behavior During Therapy  [facility] for tasks assessed/performed   Tearful at beginning of session      Past Medical History:  Diagnosis Date  . Bipolar 1 disorder ([place])   . Breast cancer ([place])   . Chronic diarrhea    loose stools twice a day on average for years  . Chronic kidney disease (CKD), stage III (moderate)   . Depression 1987  . Malignant neoplasm of overlapping sites of left breast in female, estrogen receptor positive ([place]) 03/14/2016   Dx in 09/2014, s/p bilateral mastectomies and ALND, 0/10 LN. 1.4 cm  Grade I invasive lobular, ER and PR +/Her--, Ki 67 <5% Tried anastrozole for one month, but developed suicidal idea  . Parkinson's disease ([place]) 2012  . Psoriatic arthritis ([place])   . PTSD (post-traumatic stress disorder)   . Secondary hyperparathyroidism ([place])   . Tardive dyskinesia     Past Surgical History:  Procedure Laterality Date  . ABDOMINAL HYSTERECTOMY  1987  . CHOLECYSTECTOMY  1979  . DILATION AND CURETTAGE OF UTERUS  1973  . MASTECTOMY Bilateral 09/19/2014  . TONSILLECTOMY  1970  . URETERAL REIMPLANTION Bilateral 1974    There were no vitals filed for this  visit.  Subjective Assessment - 06/18/19 1105    Subjective  Was just feeling anxious and a little depressed-mentioned that she sees someone.    Patient is accompained by:  Family member    Pertinent History  TDD:[id number], bipolar 1 disorder, psoriatic arthritis, Parkinsonism (medication-induced), COVID-19 positive on 04/03/2019    Patient Stated Goals  Pt's goals for therapy are to improve strength and balance.    Pain Onset  1 to 4 weeks ago                       [facility] Adult PT Treatment/Exercise - 06/18/19 0001      Ambulation/Gait   Ambulation/Gait  Yes    Ambulation/Gait Assistance  5: Supervision    Ambulation Distance (Feet)  230 Feet   in 2 minutes; additional 240 ft.   Assistive device  Rollator    Gait Pattern  Step-through pattern;Shuffle;Trunk flexed;Poor foot clearance - left;Poor foot clearance - right    Ambulation Surface  Level;Indoor    Gait Comments  Additional 240 ft (noted above) was up and down hallway, 2 laps, simulating hallway at home.  Discussed walking program for home for improved overall endurance for gait and improve general strengthening.      Self-Care   Self-Care  Other Self-Care Comments    Other Self-Care Comments  Discussed pt's tearfulness at beginning of session (reported anxiety and depressed due to less mobility).  Recommended that patient follow up with established care provided (husband reports at [facility]) for counseling/followup as needed.  Discussed overall activity level and thinking of "exercise as medicine" to work on some daily aspects of exercise to help with overall improved strength and endurance.       Discussed importance of consistent HEP performance (at least 3-4 times per week) in conjunction with walking program to help build her endurance and strength for improved overall activity level.  Discussed avoiding trying to "overdo" one day, and then not be able to participate in HEP, walking, or household  activities for day(s) after.    Therapeutic Exercise Reviewed HEP, with pt return demo understanding with picture cues.   Exercises: Seated Hamstring Stretch - 1 x daily - 4-5 x weekly - 3 sets - 15 sec hold Seated March - 1 x daily - 4-5 x weekly - 3 sets - 10 reps (performed 10 reps) Sit to Stand with Armchair - 1-2 x daily - 4-5 x weekly - 2 sets - 5 reps Seated Long Arc Quad - 1-2 x daily - 4-5 x weekly - 2 sets - 5 reps Seated Heel Toe Raises - 1 x daily - 4-5 x weekly - 2 sets - 10 reps -cues to separate out heel raises/toe raises if that is easier to isolate the muscles for each exercise.     PT Education - 06/18/19 1639    Education Details  Walking program for exercise    Person(s) Educated  Patient;Spouse    Methods  Explanation;Handout;Demonstration    Comprehension  Verbalized understanding       PT Short Term Goals - 06/10/19 1204      PT SHORT TERM GOAL #1   Title  Pt will perform HEP with family supervision for improved strength, balance, and gait.  TARGET:  06/18/2019    Time  4    Period  Weeks    Status  New      PT SHORT TERM GOAL #2   Title  Pt will improve TUG to less than or equal to 15 seconds for decreased fall risk.    Time  4    Period  Weeks    Status  New      PT SHORT TERM GOAL #3   Title  Berg Balance test to be assessed, with goal to be written as appropriate.    Baseline  scored 47/56 on 06/10/19 - LTG written as appropriate.    Time  4    Period  Weeks    Status  Achieved      PT SHORT TERM GOAL #4   Title  Pt will improve 5x sit<>stand to less than or equal to 18 seconds for improved functional strength.    Time  4    Period  Weeks    Status  New      PT SHORT TERM GOAL #5   Title  Pt/husband will verbalize understanding of fall prevention in home environment.    Time  4    Period  Weeks    Status  New        PT Long Term Goals - 06/10/19 1204      PT LONG TERM GOAL #1   Title  Pt will perform HEP with supervision of family  for improved balance, strength, gait.  TARGET 07/16/2019    Time  8  Period  Weeks    Status  New      PT LONG TERM GOAL #2   Title  Pt will improve gait velocity to at least 2 ft/sec for improved gait efficiency and safety in the community.    Time  8    Period  Weeks    Status  New      PT LONG TERM GOAL #3   Title  Pt will improve BERG to at least 50/56 to demo decr fall risk.    Baseline  47/56 on 06/10/19    Time  8    Period  Weeks    Status  Revised      PT LONG TERM GOAL #4   Title  Pt will improve 5x sit<>stand to less than or equal to 15 seconds for improved functional strength.    Time  8    Period  Weeks    Status  New      PT LONG TERM GOAL #5   Title  Pt will ambulate at least 1000 ft, modified independently with rollator, indoor and outdoor surfaces, for return to community mobility.    Time  8    Period  Weeks    Status  New            Plan - 06/18/19 1635    Clinical Impression Statement  Reviewed pt's HEP, with pt return demo understanding.  She is able to do 5-10 reps of her exercises in review today, but reports not really having done them at home.  Gait training activities with RW, with pt responding well to cues for improved heelstrike and step length.  Provided instruction for walking program at home as part of exercise program.  Pt will continue to beneift from skilled PT to further address strength, balance, and gait training for improved overall mobility.    Personal Factors and Comorbidities  Comorbidity 3+    Comorbidities  See above    Examination-Activity Limitations  Locomotion Level;Transfers;Stand    Examination-Participation Restrictions  Church;Community Activity    Stability/Clinical Decision Making  Evolving/Moderate complexity    Rehab Potential  Good    PT Frequency  2x / week    PT Duration  8 weeks   plus eval   PT Treatment/Interventions  ADLs/Self Care Home Management;DME Instruction;Neuromuscular re-education;Balance  training;Therapeutic activities;Therapeutic exercise;Functional mobility training;Gait training;Patient/family education    PT Next Visit Plan  Has patient been able to do HEP?  How did walking program go at home?  Gait training with rollator, balance training activities at counter    Consulted and Agree with Plan of Care  Patient;Family member/caregiver    Family Member Consulted  Husband       Patient will benefit from skilled therapeutic intervention in order to improve the following deficits and impairments:  Abnormal gait, Decreased balance, Decreased mobility, Decreased strength, Postural dysfunction, Other (comment)(Pain (due to psoriatic arthritis), not to be specifically addressed due to chronic issue)  Visit Diagnosis: Other abnormalities of gait and mobility  Muscle weakness (generalized)     Problem List Patient Active Problem List   Diagnosis Date Noted  . Chronic diarrhea 05/05/2019  . Bipolar I disorder, most recent episode depressed ([place])   . Transaminitis   . Essential hypertension   . Seizures ([place])   . Encephalopathy 04/26/2019  . Leukocytosis 04/21/2019  . Thrombocytopenia ([place]) 09/04/2018  . Vitamin D deficiency 09/04/2018  . Psoriatic arthritis ([place]) 09/04/2018  . Tubular adenoma of colon 05/28/2017  .  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 Malignant neoplasm of overlapping sites of left breast in female, estrogen receptor positive ([place]) 03/14/2016  . Chronic kidney disease, stage III (moderate) 01/19/2016  . Tardive dyskinesia 10/18/2015  . History of breast cancer left 2016 12/27/2014  . Osteopenia determined by x-ray 10/31/2014  . Rosacea 05/21/2007  . Bipolar affective disorder, mixed ([place]) 08/16/2004  . Acquired hypothyroidism 04/03/1996    Nakari Bracknell W. 06/18/2019, 4:39 PM  Frazier Butt., PT   [facility] [address] [place], [name],  [address] Phone: [phone]   Fax:  [phone]  Name: Yaret Hush MRN: [id number] Date of Birth: Aug 06, 1950

## 2019-06-18 NOTE — Therapy (Addendum)
[facility] [address] [place], [name], [address] Phone: [phone]   Fax:  [phone]  Occupational Therapy Treatment  Patient Details  Name: Tiffany [name] MRN: [id number] Date of Birth: [birth year] Referring Provider (OT): Dr. Ranell Patrick   Encounter Date: 06/18/2019  OT End of Session - 06/18/19 1027    Visit Number  3    Number of Visits  17    Date for OT Re-Evaluation  07/17/19    Authorization Type  Medicare    Authorization - Visit Number  3    Authorization - Number of Visits  10    Progress Note Due on Visit  10    OT Start Time  1022    OT Stop Time  1100    OT Time Calculation (min)  38 min    Activity Tolerance  Patient tolerated treatment well    Behavior During Therapy  [facility] for tasks assessed/performed       Past Medical History:  Diagnosis Date  . Bipolar 1 disorder ([place])   . Breast cancer ([place])   . Chronic diarrhea    loose stools twice a day on average for years  . Chronic kidney disease (CKD), stage III (moderate)   . Depression 1987  . Malignant neoplasm of overlapping sites of left breast in female, estrogen receptor positive ([place]) 03/14/2016   Dx in 09/2014, s/p bilateral mastectomies and ALND, 0/10 LN. 1.4 cm  Grade I invasive lobular, ER and PR +/Her--, Ki 67 <5% Tried anastrozole for one month, but developed suicidal idea  . Parkinson's disease ([place]) 2012  . Psoriatic arthritis ([place])   . PTSD (post-traumatic stress disorder)   . Secondary hyperparathyroidism ([place])   . Tardive dyskinesia     Past Surgical History:  Procedure Laterality Date  . ABDOMINAL HYSTERECTOMY  1987  . CHOLECYSTECTOMY  1979  . DILATION AND CURETTAGE OF UTERUS  1973  . MASTECTOMY Bilateral 09/19/2014  . TONSILLECTOMY  1970  . URETERAL REIMPLANTION Bilateral 1974    There were no vitals filed for this visit.  Subjective Assessment - 06/18/19 1023    Subjective   Denies pain, pt reports she was sore for 3 days  after last OT session, she requests to do only 5 reps each exercise    Pertinent History  69 y.o. female with medical history significant of hypothyroidism, bipolar 1 disorder, psoriatic arthritis, Parkinsonism, COVID-19 positive on 1/23.  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  Pt received therapies at [facility] and she was d/c home 05/05/19.    Patient Stated Goals  increase indepdence and balance    Currently in Pain?  No/denies    Pain Onset  --                           OT Treatment/ Education - 06/18/19 1036    Education Details  PWR! up, rock and twist in supine, 10 reps each, cane ex chest press and shoulder flexion 5 reps each, handwriting strategies, min v.c foam grip issued    Person(s) Educated  Patient;Spouse    Methods  Explanation;Demonstration;Verbal cues;Handout    Comprehension  Verbalized understanding;Returned demonstration       OT Short Term Goals - 05/18/19 1443      OT SHORT TERM GOAL #1   Title  I with HEP.  Time  4    Period  Weeks    Status  New    Target Date  06/17/19      OT SHORT TERM GOAL #2   Title  Pt will verbalize understanding of adpated strategies for ADLs/ I ADLs in order to maximize safety and I.    Time  4    Period  Weeks    Status  New      OT SHORT TERM GOAL #3   Title  Pt will demonstrate ability to write a paragraph with 100% legibility and no significant decrease in letter size.    Baseline  Pt reports handwriting becomes smalller when she writes a paragraph    Time  4    Period  Weeks    Status  New      OT SHORT TERM GOAL #4   Title  Pt will demonstrate improved RUE functional reach/ use for ADLS as evidenced by increasing box/ blocks score by 3 blocks.    Time  4    Period  Weeks    Status  New        OT Long Term Goals - 05/18/19 1303      OT LONG TERM GOAL #1   Title   Pt will demonstrate ability to retieve a lightweight object at 125 shoulder flexion with -5 elbow extension.    Baseline  120, -10    Time  8    Period  Weeks    Status  New    Target Date  07/17/19      OT LONG TERM GOAL #2   Title  Pt will demonstrate ability to retrieve a lightweight object at 120 shoulder flexion with -5 elbow extension.    Baseline  115, -10    Time  8    Period  Weeks    Status  New      OT LONG TERM GOAL #3   Title  Pt will verbalize understanding of ways to keep thinking skills sharp and memory compensations prn.    Time  8    Period  Weeks    Status  New      OT LONG TERM GOAL #4   Title  Pt will perform basic cooking and home management with supervision.    Time  8    Period  Weeks    Status  New            Plan - 06/22/19 3335    Clinical Impression Statement  Pt demonstrates understanding of exercises in supine. Pt requests to decrease to 5 reps eacch exercise as she was overly sore last visit.    OT Occupational Profile and History  Detailed Assessment- Review of Records and additional review of physical, cognitive, psychosocial history related to current functional performance    Occupational performance deficits (Please refer to evaluation for details):  ADL's;IADL's;Leisure;Social Participation    Body Structure / Function / Physical Skills  ADL;Balance;Mobility;Strength;Flexibility;FMC;Coordination;GMC;Decreased knowledge of precautions;ROM;Decreased knowledge of use of DME;IADL;Dexterity;Sensation;UE functional use    Youth worker;Understand;Thought    Rehab Potential  Good    Clinical Decision Making  Limited treatment options, no task modification necessary    Comorbidities Affecting Occupational Performance:  May have comorbidities impacting occupational performance    Modification or Assistance to Complete Evaluation   No modification of tasks or assist necessary to complete eval    OT Frequency  2x / week   plus eval, may  d/c  after 6 weeks dependent upon progress   OT Duration  8 weeks    OT Treatment/Interventions  Self-care/ADL training;Therapeutic exercise;Balance training;Functional Mobility Training;Aquatic Therapy;Neuromuscular education;Manual Therapy;Therapeutic activities;Energy conservation;Paraffin;DME and/or AE instruction;Cognitive remediation/compensation;Gait Training;Fluidtherapy;Moist Heat;Passive range of motion;Patient/family education    Plan  review HEP    Consulted and Agree with Plan of Care  Patient;Family member/caregiver    Family Member Consulted  husband         Patient will benefit from skilled therapeutic intervention in order to improve the following deficits and impairments:  Strength, coordination, UE functional use, balance, cognition         Visit Diagnosis: Muscle weakness (generalized)  Stiffness of left shoulder, not elsewhere classified  Stiffness of right shoulder, not elsewhere classified  Frontal lobe and executive function deficit    Problem List Patient Active Problem List   Diagnosis Date Noted  . Chronic diarrhea 05/05/2019  . Bipolar I disorder, most recent episode depressed ([place])   . Transaminitis   . Essential hypertension   . Seizures ([place])   . Encephalopathy 04/26/2019  . Leukocytosis 04/21/2019  . Thrombocytopenia ([place]) 09/04/2018  . Vitamin D deficiency 09/04/2018  . Psoriatic arthritis ([place]) 09/04/2018  . Tubular adenoma of colon 05/28/2017  . 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 Malignant neoplasm of overlapping sites of left breast in female, estrogen receptor positive ([place]) 03/14/2016  . Chronic kidney disease, stage III (moderate) 01/19/2016  . Tardive dyskinesia 10/18/2015  . History of breast cancer left 2016 12/27/2014  . Osteopenia determined by x-ray 10/31/2014  . Rosacea 05/21/2007  . Bipolar affective disorder, mixed ([place]) 08/16/2004  . Acquired hypothyroidism 04/03/1996     [place] 06/18/2019, 4:09 PM Theone Murdoch, OTR/L [phone] Phone: [phone] 8:34 AM 06/22/19 [facility] [address] [place], [name], [address] Phone: [phone]   Fax:  [phone]  Name: Tiffany [name] MRN: [id number] Date of Birth: 1950-08-10

## 2019-06-22 ENCOUNTER — Ambulatory Visit: Payer: Medicare Other | Admitting: Physical Therapy

## 2019-06-22 ENCOUNTER — Ambulatory Visit: Payer: Medicare Other | Admitting: Occupational Therapy

## 2019-06-22 ENCOUNTER — Other Ambulatory Visit: Payer: Self-pay

## 2019-06-22 DIAGNOSIS — M6281 Muscle weakness (generalized): Secondary | ICD-10-CM

## 2019-06-22 DIAGNOSIS — R41844 Frontal lobe and executive function deficit: Secondary | ICD-10-CM

## 2019-06-22 DIAGNOSIS — M25611 Stiffness of right shoulder, not elsewhere classified: Secondary | ICD-10-CM

## 2019-06-22 DIAGNOSIS — R2681 Unsteadiness on feet: Secondary | ICD-10-CM

## 2019-06-22 DIAGNOSIS — R2689 Other abnormalities of gait and mobility: Secondary | ICD-10-CM

## 2019-06-22 DIAGNOSIS — R278 Other lack of coordination: Secondary | ICD-10-CM | POA: Diagnosis not present

## 2019-06-22 DIAGNOSIS — M25612 Stiffness of left shoulder, not elsewhere classified: Secondary | ICD-10-CM

## 2019-06-22 NOTE — Therapy (Signed)
[facility] [address] [place], [name], [address] Phone: [phone]   Fax:  [phone]  Occupational Therapy Treatment  Patient Details  Name: Tiffany [name] MRN: [id number] Date of Birth: 1950/07/09 Referring Provider (OT): Dr. Ranell Patrick   Encounter Date: 06/22/2019  OT End of Session - 06/22/19 1102    Visit Number  4    Number of Visits  17    Date for OT Re-Evaluation  07/17/19    Authorization Type  Medicare    Authorization - Visit Number  4    Authorization - Number of Visits  10    Progress Note Due on Visit  10    OT Start Time  1018    OT Stop Time  1100    OT Time Calculation (min)  42 min    Activity Tolerance  Patient tolerated treatment well    Behavior During Therapy  [facility] for tasks assessed/performed       Past Medical History:  Diagnosis Date  . Bipolar 1 disorder ([place])   . Breast cancer ([place])   . Chronic diarrhea    loose stools twice a day on average for years  . Chronic kidney disease (CKD), stage III (moderate)   . Depression 1987  . Malignant neoplasm of overlapping sites of left breast in female, estrogen receptor positive ([place]) 03/14/2016   Dx in 09/2014, s/p bilateral mastectomies and ALND, 0/10 LN. 1.4 cm  Grade I invasive lobular, ER and PR +/Her--, Ki 67 <5% Tried anastrozole for one month, but developed suicidal idea  . Parkinson's disease ([place]) 2012  . Psoriatic arthritis ([place])   . PTSD (post-traumatic stress disorder)   . Secondary hyperparathyroidism ([place])   . Tardive dyskinesia     Past Surgical History:  Procedure Laterality Date  . ABDOMINAL HYSTERECTOMY  1987  . CHOLECYSTECTOMY  1979  . DILATION AND CURETTAGE OF UTERUS  1973  . MASTECTOMY Bilateral 09/19/2014  . TONSILLECTOMY  1970  . URETERAL REIMPLANTION Bilateral 1974    There were no vitals filed for this visit.  Subjective Assessment - 06/22/19 1103    Pertinent History  69 y.o. female with medical history  significant of hypothyroidism, bipolar 1 disorder, psoriatic arthritis, Parkinsonism, COVID-19 positive on 1/23.  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  Pt [facility] at [facility] and she was d/c home 05/05/19.    Patient Stated Goals  increase indepdence and balance    Currently in Pain?  No/denies            treatment: functional reaching in seated to copy small peg design, then removing pegs with in hand manipulation, min v.c for correct design for cognitive component               OT Education - 06/22/19 1101    Education Details  PWR! up, rock and twist in supine, 10 reps each, cane ex chest press and shoulder flexion 10 reps each, in supine, then 5 reps in seated, min v.c, education regarding memory compensations- handout provided    Person(s) Educated  Patient;Spouse    Methods  Explanation;Demonstration;Verbal cues;Handout    Comprehension  Verbalized understanding;Returned demonstration       OT Short Term Goals - 05/18/19 1443      OT SHORT TERM GOAL #1   Title  I with HEP.    Time  4    Period  Weeks    Status  New    Target Date  06/17/19      OT SHORT TERM GOAL #2   Title  Pt will verbalize understanding of adpated strategies for ADLs/ I ADLs in order to maximize safety and I.    Time  4    Period  Weeks    Status  New      OT SHORT TERM GOAL #3   Title  Pt will demonstrate ability to write a paragraph with 100% legibility and no significant decrease in letter size.    Baseline  Pt reports handwriting becomes smalller when she writes a paragraph    Time  4    Period  Weeks    Status  New      OT SHORT TERM GOAL #4   Title  Pt will demonstrate improved RUE functional reach/ use for ADLS as evidenced by increasing box/ blocks score by 3 blocks.    Time  4    Period  Weeks    Status  New        OT Long Term  Goals - 05/18/19 1303      OT LONG TERM GOAL #1   Title  Pt will demonstrate ability to retieve a lightweight object at 125 shoulder flexion with -5 elbow extension.    Baseline  120, -10    Time  8    Period  Weeks    Status  New    Target Date  07/17/19      OT LONG TERM GOAL #2   Title  Pt will demonstrate ability to retrieve a lightweight object at 120 shoulder flexion with -5 elbow extension.    Baseline  115, -10    Time  8    Period  Weeks    Status  New      OT LONG TERM GOAL #3   Title  Pt will verbalize understanding of ways to keep thinking skills sharp and memory compensations prn.    Time  8    Period  Weeks    Status  New      OT LONG TERM GOAL #4   Title  Pt will perform basic cooking and home management with supervision.    Time  8    Period  Weeks    Status  New              Patient will benefit from skilled therapeutic intervention in order to improve the following deficits and impairments:           Visit Diagnosis: Muscle weakness (generalized)  Stiffness of left shoulder, not elsewhere classified  Stiffness of right shoulder, not elsewhere classified  Frontal lobe and executive function deficit    Problem List Patient Active Problem List   Diagnosis Date Noted  . Chronic diarrhea 05/05/2019  . Bipolar I disorder, most recent episode depressed ([place])   . Transaminitis   . Essential hypertension   . Seizures ([place])   . Encephalopathy 04/26/2019  . Leukocytosis 04/21/2019  . Thrombocytopenia ([place]) 09/04/2018  . Vitamin D deficiency 09/04/2018  . Psoriatic arthritis ([place]) 09/04/2018  . Tubular adenoma of colon 05/28/2017  . 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 Malignant neoplasm of overlapping sites of left breast in female, estrogen receptor positive ([place]) 03/14/2016  . Chronic kidney disease, stage III (moderate) 01/19/2016  . Tardive dyskinesia 10/18/2015  . History of breast  cancer  left 2016 12/27/2014  . Osteopenia determined by x-ray 10/31/2014  . Rosacea 05/21/2007  . Bipolar affective disorder, mixed ([place]) 08/16/2004  . Acquired hypothyroidism 04/03/1996    Tiffany [name] 06/22/2019, 11:03 AM  [name] [address] [place], [name], [address] Phone: [phone]   Fax:  [phone]  Name: Tiffany [name] MRN: [id number] Date of Birth: Sep 02, 1950

## 2019-06-22 NOTE — Therapy (Signed)
[facility] [address] [place], [name], [address] Phone: [phone]   Fax:  [phone]  Physical Therapy Treatment  Patient Details  Name: Tiffany [name] MRN: [id number] Date of Birth: 12/13/1950 Referring Provider (PT): Mar[place]e [place], [place]   Encounter Date: 06/22/2019  PT End of Session - 06/22/19 1226    Visit Number  4    Number of Visits  17    Date for PT Re-Evaluation  29/51/88   90 day recert for 8 wk POC   Authorization Type  Medicare/AARP- will need 10th visit progress note    PT Start Time  1105    PT Stop Time  1145    PT Time Calculation (min)  40 min    Equipment Utilized During Treatment  Gait belt    Activity Tolerance  Patient tolerated treatment well   several seated rest breaks; fatigued by end of session   Behavior During Therapy  [facility] for tasks assessed/performed   Tearful at beginning of session      Past Medical History:  Diagnosis Date  . Bipolar 1 disorder ([place])   . Breast cancer ([place])   . Chronic diarrhea    loose stools twice a day on average for years  . Chronic kidney disease (CKD), stage III (moderate)   . Depression 1987  . Malignant neoplasm of overlapping sites of left breast in female, estrogen receptor positive ([place]) 03/14/2016   Dx in 09/2014, s/p bilateral mastectomies and ALND, 0/10 LN. 1.4 cm  Grade I invasive lobular, ER and PR +/Her--, Ki 67 <5% Tried anastrozole for one month, but developed suicidal idea  . Parkinson's disease ([place]) 2012  . Psoriatic arthritis ([place])   . PTSD (post-traumatic stress disorder)   . Secondary hyperparathyroidism ([place])   . Tardive dyskinesia     Past Surgical History:  Procedure Laterality Date  . ABDOMINAL HYSTERECTOMY  1987  . CHOLECYSTECTOMY  1979  . DILATION AND CURETTAGE OF UTERUS  1973  . MASTECTOMY Bilateral 09/19/2014  . TONSILLECTOMY  1970  . URETERAL REIMPLANTION Bilateral 1974    There were no vitals filed for this  visit.  Subjective Assessment - 06/22/19 1106    Subjective  Been doing more than 2-3 minutes of walking at a time.  Feels good to move.    Patient is accompained by:  Family member    Pertinent History  CZY:[id number], bipolar 1 disorder, psoriatic arthritis, Parkinsonism (medication-induced), COVID-19 positive on 04/03/2019    Patient Stated Goals  Pt's goals for therapy are to improve strength and balance.    Currently in Pain?  No/denies    Pain Onset  1 to 4 weeks ago                       [facility] Adult PT Treatment/Exercise - 06/22/19 1221      Ambulation/Gait   Ambulation/Gait  Yes    Ambulation/Gait Assistance  5: Supervision    Ambulation Distance (Feet)  345 Feet   then 315 ft   Assistive device  Rollator    Gait Pattern  Step-through pattern;Shuffle;Trunk flexed    Ambulation Surface  Level;Indoor    Gait Comments  Gait with environmental scanning tasks, min guard, occasional cues to stay closer within BOS of rollator.  Pt with slightly slowed gait with environmental scanning; no overt LOB, but she does have good head motions for scanning.  Discussed walking at home, pt reports trying to ambulate short distances  without rollator.  Based on pt's fall risk and instability at times on RLE, advised pt to continue to use rollator at all times.          Balance Exercises - 06/22/19 1138      Balance Exercises: Standing   Standing Eyes Opened  Wide (BOA);Head turns;Solid surface;5 reps   2 sets, head nods, at counter, BUE support>no UE support   Standing Eyes Closed  Wide (BOA);Solid surface;1 rep;10 secs    Partial Tandem Stance  Eyes open;Upper extremity support 1;1 rep;10 secs    Sidestepping  Upper extremity support;2 reps   R and L along counter; Pt with R knee instability to R side   Other Standing Exercises  Alternating hip kicks, side, x 5 reps each leg, with BUE support. Progressed to side step and weightshift x 5 reps each leg, BUE support.  Forward  step and weightshift, BUE support x 10 reps.  Alternating step taps x 10 reps at 4" cabinet shelf, BUE support.  Min guard support provided .        Standing lateral weightshifting, 2 sets x 5 reps, BUE support at counter, c/o knee pain with weightshift to RLE.  PT Education - 06/22/19 1224    Education Details  Additions to HEP for standing exercises (see instructions).  Educated to continue to use rollator at home for safety with gait.    Person(s) Educated  Patient;Spouse    Methods  Explanation;Demonstration;Handout    Comprehension  Verbalized understanding;Returned demonstration       PT Short Term Goals - 06/10/19 1204      PT SHORT TERM GOAL #1   Title  Pt will perform HEP with family supervision for improved strength, balance, and gait.  TARGET:  06/18/2019    Time  4    Period  Weeks    Status  New      PT SHORT TERM GOAL #2   Title  Pt will improve TUG to less than or equal to 15 seconds for decreased fall risk.    Time  4    Period  Weeks    Status  New      PT SHORT TERM GOAL #3   Title  Berg Balance test to be assessed, with goal to be written as appropriate.    Baseline  scored 47/56 on 06/10/19 - LTG written as appropriate.    Time  4    Period  Weeks    Status  Achieved      PT SHORT TERM GOAL #4   Title  Pt will improve 5x sit<>stand to less than or equal to 18 seconds for improved functional strength.    Time  4    Period  Weeks    Status  New      PT SHORT TERM GOAL #5   Title  Pt/husband will verbalize understanding of fall prevention in home environment.    Time  4    Period  Weeks    Status  New        PT Long Term Goals - 06/10/19 1204      PT LONG TERM GOAL #1   Title  Pt will perform HEP with supervision of family for improved balance, strength, gait.  TARGET 07/16/2019    Time  8    Period  Weeks    Status  New      PT LONG TERM GOAL #2   Title  Pt will improve gait velocity  to at least 2 ft/sec for improved gait efficiency and safety in  the community.    Time  8    Period  Weeks    Status  New      PT LONG TERM GOAL #3   Title  Pt will improve BERG to at least 50/56 to demo decr fall risk.    Baseline  47/56 on 06/10/19    Time  8    Period  Weeks    Status  Revised      PT LONG TERM GOAL #4   Title  Pt will improve 5x sit<>stand to less than or equal to 15 seconds for improved functional strength.    Time  8    Period  Weeks    Status  New      PT LONG TERM GOAL #5   Title  Pt will ambulate at least 1000 ft, modified independently with rollator, indoor and outdoor surfaces, for return to community mobility.    Time  8    Period  Weeks    Status  New            Plan - 06/22/19 1226    Clinical Impression Statement  Pt reports walking more than 2-3 minutes at a time at home; in clinic, she is able to walk furhter today with rollator than last visit.  Skilled PT session today focused on standing counter balance exercises, with 2 new standing exercises added to HEP.  Pt has occasional R knee instability with lateral weightshifting and with sidestepping to R side, which she attributes to arthritis.  She will conitnue to benefit from skilled PT to further address strength, balance, and gait training for improved overall functional mobility.    Personal Factors and Comorbidities  Comorbidity 3+    Comorbidities  See above    Examination-Activity Limitations  Locomotion Level;Transfers;Stand    Examination-Participation Restrictions  Church;Community Activity    Stability/Clinical Decision Making  Evolving/Moderate complexity    Rehab Potential  Good    PT Frequency  2x / week    PT Duration  8 weeks   plus eval   PT Treatment/Interventions  ADLs/Self Care Home Management;DME Instruction;Neuromuscular re-education;Balance training;Therapeutic activities;Therapeutic exercise;Functional mobility training;Gait training;Patient/family education    PT Next Visit Plan  Review additions to HEP; gait training with  rollator, further balance activities at counter.    Consulted and Agree with Plan of Care  Patient;Family member/caregiver    Family Member Consulted  Husband       Patient will benefit from skilled therapeutic intervention in order to improve the following deficits and impairments:  Abnormal gait, Decreased balance, Decreased mobility, Decreased strength, Postural dysfunction, Other (comment)(Pain (due to psoriatic arthritis), not to be specifically addressed due to chronic issue)  Visit Diagnosis: Other abnormalities of gait and mobility  Unsteadiness on feet     Problem List Patient Active Problem List   Diagnosis Date Noted  . Chronic diarrhea 05/05/2019  . Bipolar I disorder, most recent episode depressed ([place])   . Transaminitis   . Essential hypertension   . Seizures ([place])   . Encephalopathy 04/26/2019  . Leukocytosis 04/21/2019  . Thrombocytopenia ([place]) 09/04/2018  . Vitamin D deficiency 09/04/2018  . Psoriatic arthritis ([place]) 09/04/2018  . Tubular adenoma of colon 05/28/2017  . 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 Malignant neoplasm of overlapping sites of left breast in female, estrogen receptor positive ([place]) 03/14/2016  . Chronic kidney disease, stage  III (moderate) 01/19/2016  . Tardive dyskinesia 10/18/2015  . History of breast cancer left 2016 12/27/2014  . Osteopenia determined by x-ray 10/31/2014  . Rosacea 05/21/2007  . Bipolar affective disorder, mixed ([place]) 08/16/2004  . Acquired hypothyroidism 04/03/1996    Unita Detamore W. 06/22/2019, 12:30 PM  Frazier Butt., PT   [facility] [address] [place], [name], [address] Phone: [phone]   Fax:  [phone]  Name: Azoria Abbett MRN: [id number] Date of Birth: [birth year]

## 2019-06-22 NOTE — Patient Instructions (Signed)

## 2019-06-22 NOTE — Patient Instructions (Addendum)
Access Code: [email or web address] URL: [email or web address] Date: 06/22/2019 Prepared by: Mady Haagensen  Exercises Seated Hamstring Stretch - 1 x daily - 4-5 x weekly - 3 sets - 15 hold Seated March - 1 x daily - 4-5 x weekly - 3 sets - 10 reps Sit to Stand with Armchair - 1-2 x daily - 4-5 x weekly - 2 sets - 5 reps Seated Long Arc Quad - 1-2 x daily - 4-5 x weekly - 2 sets - 5 reps Seated Heel Toe Raises - 1 x daily - 4-5 x weekly - 2 sets - 10 reps  Added 06/22/2019: Side Stepping (and weightshift) with Counter Support - 1 x daily - 4 x weekly - 1 sets - 10 reps Alternating Step Taps with Counter Support - 1 x daily - 4 x weekly - 1 sets - 10 reps

## 2019-06-23 ENCOUNTER — Ambulatory Visit: Payer: Medicare Other | Admitting: Psychology

## 2019-06-24 ENCOUNTER — Ambulatory Visit: Payer: Medicare Other | Admitting: Speech Pathology

## 2019-06-24 ENCOUNTER — Telehealth: Payer: Self-pay

## 2019-06-24 ENCOUNTER — Ambulatory Visit: Payer: Medicare Other | Admitting: Physical Therapy

## 2019-06-24 ENCOUNTER — Ambulatory Visit: Payer: Medicare Other | Admitting: Occupational Therapy

## 2019-06-24 NOTE — Telephone Encounter (Signed)
Noted  

## 2019-06-24 NOTE — Telephone Encounter (Signed)
Patient called back to say that she doesn't feel sucidal today

## 2019-06-24 NOTE — Telephone Encounter (Signed)
Patient dropped off letter to provider on 06/23/2019 stating her moods have worsened, increase in irritability leading to increased depression and anxiety. Patient taking ativan prn to help with anxiety which has been helpful. Patient was having SI with plans on 06/21/2019 but did not want to go to ER. She spoke with her daughter on Tuesday 06/22/2019 and was starting to feel better. She has not been able to get into see B[facility]i due to therapist moving, patient's husband was also out of town which was causing her symptoms to worsen as well. Has apt on 07/07/2019 with Rollene Fare but asking if she should be seen sooner?    Called patient this morning 04/15 and left her a message to call back on how she's feeling today.

## 2019-06-25 ENCOUNTER — Other Ambulatory Visit: Payer: Self-pay

## 2019-06-25 ENCOUNTER — Encounter: Payer: Self-pay | Admitting: Family Medicine

## 2019-06-25 ENCOUNTER — Ambulatory Visit (INDEPENDENT_AMBULATORY_CARE_PROVIDER_SITE_OTHER): Payer: Medicare Other | Admitting: Family Medicine

## 2019-06-25 VITALS — BP 122/78 | HR 71 | Temp 97.3°F | Resp 14 | Ht 64.0 in | Wt 205.6 lb

## 2019-06-25 DIAGNOSIS — L719 Rosacea, unspecified: Secondary | ICD-10-CM

## 2019-06-25 DIAGNOSIS — I10 Essential (primary) hypertension: Secondary | ICD-10-CM | POA: Diagnosis not present

## 2019-06-25 DIAGNOSIS — M8580 Other specified disorders of bone density and structure, unspecified site: Secondary | ICD-10-CM | POA: Diagnosis not present

## 2019-06-25 DIAGNOSIS — F3160 Bipolar disorder, current episode mixed, unspecified: Secondary | ICD-10-CM | POA: Diagnosis not present

## 2019-06-25 DIAGNOSIS — R5381 Other malaise: Secondary | ICD-10-CM

## 2019-06-25 DIAGNOSIS — E2839 Other primary ovarian failure: Secondary | ICD-10-CM

## 2019-06-25 NOTE — Patient Instructions (Signed)
Please return in 3 months for recheck.   I have placed referrals for dermatology and DEXA bone density screening.  We will call you to get you scheduled for the dermatologist.   I have ordered a mammogram and/or bone density for you as we discussed today: []   Mammogram  [x]   Bone Density  Please call the office checked below to schedule your appointment: Your appointment will at the following location  []   [facility]      [place], [place]         [x]   [facility]  [place], [place]  Make sure to wear two peace clothing  No lotions powders or deodorants the day of the appointment Make sure to bring picture ID and insurance card.  Bring list of medications you are currently taking including any supplements.    If you have any questions or concerns, please don't hesitate to send me a message via MyChart or call the office at [phone]. Thank you for visiting with [place] today! It's our pleasure caring for you.

## 2019-06-25 NOTE — Progress Notes (Signed)
Subjective  CC:  Chief Complaint  Patient presents with  . paraesthesia and pain of right extremity    states that both numbing and pain that she was experiencing in the right arm is now gone  . Referral    dermatologist to handle rosacea, prefers woman  . Arthritis    has restarted Cimzia injections    HPI: Tiffany [name] is a 69 y.o. female who presents to the office today to address the problems listed above in the chief complaint.  Hypertension f/u: Control is good . Pt reports she is doing well. taking medications as instructed, no medication side effects noted, no TIAs, no chest pain on exertion, no dyspnea on exertion, no swelling of ankles. She denies adverse effects from his BP medications. Compliance with medication is good.   Requests derm referral for recurrent rosacea; hasn't seen derm in many years but having more flares and needs help with it. Has failed typical topicals.   Psoriatic arthritis now better controlled back on cimzia  Deconditioning and weakness: progressing with PT/OT  Bipolar: ok, good days and bad days but having more irritability and agitation; psych is using xanax as needed since she had encephalopathy after covid.   Pain in arm and tingling in leg has completely resolved. See last note.   HM: due dexa for f/u osteopenia; no mammograms:h/o left breast cancer s/p double mastectomy   Assessment  1. Rosacea   2. Bipolar affective disorder, mixed ([place])   3. Essential hypertension   4. Osteopenia determined by x-ray   5. Hypoestrogenism   6. Physical deconditioning      Plan    Hypertension f/u: BP control is well controlled. No change in meds  Bipolar f/u: increasing agitation and anxiety: rec f/u with psych and addition of mood stablilizer discussion.  HM: dexa ordered.  Rosacea: refer to derm  Deconditioning: continue PT/ot  Education regarding management of these chronic disease states was given. Management strategies discussed on  successive visits include dietary and exercise recommendations, goals of achieving and maintaining IBW, and lifestyle modifications aiming for adequate sleep and minimizing stressors.   Follow up: No follow-ups on file.  Orders Placed This Encounter  Procedures  . DG Bone Density  . Ambulatory referral to Dermatology   No orders of the defined types were placed in this encounter.     BP Readings from Last 3 Encounters:  06/25/19 122/78  06/07/19 (!) 141/91  05/19/19 122/78   Wt Readings from Last 3 Encounters:  06/25/19 205 lb 9.6 oz (93.3 kg)  06/07/19 201 lb (91.2 kg)  05/19/19 201 lb 12.8 oz (91.5 kg)    No results found for: CHOL No results found for: HDL No results found for: [facility] Lab Results  Component Value Date   TRIG 117 04/06/2019   No results found for: CHOLHDL No results found for: LDLDIRECT Lab Results  Component Value Date   CREATININE 2.0 (A) 06/08/2019   BUN 29 (A) 06/08/2019   NA 138 06/08/2019   K 4.6 06/08/2019   CL 105 06/08/2019   CO2 26 (A) 06/08/2019    The ASCVD Risk score Mikey Bussing DC Jr., et al., 2013) failed to calculate for the following reasons:   Unable to determine if patient is Non-Hispanic African American  I reviewed the patients updated PMH, FH, and SocHx.    Patient Active Problem List   Diagnosis Date Noted  . Chronic diarrhea 05/05/2019    Priority: High  . Essential hypertension  Priority: High  . Seizures ([place])     Priority: High  . Encephalopathy 04/26/2019    Priority: High  . Tubular adenoma of colon 05/28/2017    Priority: High  . Chronic kidney disease, stage III (moderate) 01/19/2016    Priority: High  . Tardive dyskinesia 10/18/2015    Priority: High  . Bipolar affective disorder, mixed ([place]) 08/16/2004    Priority: High  . Acquired hypothyroidism 04/03/1996    Priority: High  . Thrombocytopenia ([place]) 09/04/2018    Priority: Medium  . History of breast cancer left 2016 12/27/2014    Priority:  Medium  . Vitamin D deficiency 09/04/2018    Priority: Low  . Psoriatic arthritis ([place]) 09/04/2018    Priority: Low  . 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 Osteopenia determined by x-ray 10/31/2014    Priority: Low  . Rosacea 05/21/2007    Priority: Low  . Bipolar I disorder, most recent episode depressed ([place])   . Transaminitis   . Leukocytosis 04/21/2019  . Malignant neoplasm of overlapping sites of left breast in female, estrogen receptor positive ([place]) 03/14/2016    Allergies: Aripiprazole, Lactose intolerance (gi), Methotrexate, Cefdinir, Etanercept, Exemestane, Fluoxetine, Methylprednisolone sodium succ, Epinephrine, and Nitrofurantoin  Social History: Patient  reports that she has never smoked. She has never used smokeless tobacco. She reports that she does not drink alcohol or use drugs.  Current Meds  Medication Sig  . acetaminophen (TYLENOL) 500 MG tablet Take 500 mg by mouth daily as needed for headache (pain).   Marland Kitchen amLODipine (NORVASC) 2.5 MG tablet Take 1 tablet (2.5 mg total) by mouth daily.  . calcitRIOL (ROCALTROL) 0.25 MCG capsule Take 0.25 mcg by mouth every Monday, Wednesday, and Friday.   . Certolizumab Pegol (CIMZIA ) Inject 200 mg into the skin every 30 (thirty) days.  . Cholecalciferol 50 MCG (2000 UT) TABS Take 1 tablet (2,000 Units total) by mouth See admin instructions. Take 1 tablet by mouth daily  . lamoTRIgine (LAMICTAL) 100 MG tablet Take 1 tablet (100 mg total) by mouth at bedtime.  . levETIRAcetam (KEPPRA) 250 MG tablet TAKE 1 TABLET(250 MG) BY MOUTH TWICE DAILY  . levothyroxine (SYNTHROID) 112 MCG tablet Take 1 tablet (112 mcg total) by mouth daily at 6 (six) AM.  . loperamide (IMODIUM) 2 MG capsule Take 1 capsule (2 mg total) by mouth as needed for diarrhea or loose stools.  Marland Kitchen LORazepam (ATIVAN) 1 MG tablet Take 1 tablet (1 mg total) by mouth 2 (two) times daily.  . sodium bicarbonate 650  MG tablet Take 1 tablet (650 mg total) by mouth 2 (two) times daily.    Review of Systems: Cardiovascular: negative for chest pain, palpitations, leg swelling, orthopnea Respiratory: negative for SOB, wheezing or persistent cough Gastrointestinal: negative for abdominal pain Genitourinary: negative for dysuria or gross hematuria  Objective  Vitals: BP 122/78   Pulse 71   Temp (!) 97.3 F (36.3 C) (Temporal)   Resp 14   Ht 5\' 4"  (1.626 m)   Wt 205 lb 9.6 oz (93.3 kg)   SpO2 97%   BMI 35.29 kg/m  General: no acute distress  Psych:  Alert and oriented, normal mood and affect   Commons side effects, risks, benefits, and alternatives for medications and treatment plan prescribed today were discussed, and the patient expressed understanding of the given instructions. Patient is instructed to call or message via MyChart if he/she has any questions or  concerns regarding our treatment plan. No barriers to understanding were identified. We discussed Red Flag symptoms and signs in detail. Patient expressed understanding regarding what to do in case of urgent or emergency type symptoms.   Medication list was reconciled, printed and provided to the patient in AVS. Patient instructions and summary information was reviewed with the patient as documented in the AVS. This note was prepared with assistance of Dragon voice recognition software. Occasional wrong-word or sound-a-like substitutions may have occurred due to the inherent limitations of voice recognition software  This visit occurred during the SARS-CoV-2 public health emergency.  Safety protocols were in place, including screening questions prior to the visit, additional usage of staff PPE, and extensive cleaning of exam room while observing appropriate contact time as indicated for disinfecting solutions.

## 2019-06-29 ENCOUNTER — Encounter: Payer: Self-pay | Admitting: Physical Therapy

## 2019-06-29 ENCOUNTER — Ambulatory Visit: Payer: Medicare Other | Admitting: Occupational Therapy

## 2019-06-29 ENCOUNTER — Other Ambulatory Visit: Payer: Self-pay

## 2019-06-29 ENCOUNTER — Ambulatory Visit: Payer: Medicare Other | Admitting: Physical Therapy

## 2019-06-29 ENCOUNTER — Ambulatory Visit: Payer: Medicare Other

## 2019-06-29 DIAGNOSIS — R2689 Other abnormalities of gait and mobility: Secondary | ICD-10-CM

## 2019-06-29 DIAGNOSIS — M25612 Stiffness of left shoulder, not elsewhere classified: Secondary | ICD-10-CM

## 2019-06-29 DIAGNOSIS — M25611 Stiffness of right shoulder, not elsewhere classified: Secondary | ICD-10-CM

## 2019-06-29 DIAGNOSIS — R2681 Unsteadiness on feet: Secondary | ICD-10-CM

## 2019-06-29 DIAGNOSIS — R278 Other lack of coordination: Secondary | ICD-10-CM | POA: Diagnosis not present

## 2019-06-29 DIAGNOSIS — M6281 Muscle weakness (generalized): Secondary | ICD-10-CM

## 2019-06-29 DIAGNOSIS — R41844 Frontal lobe and executive function deficit: Secondary | ICD-10-CM

## 2019-06-29 DIAGNOSIS — R41841 Cognitive communication deficit: Secondary | ICD-10-CM

## 2019-06-29 NOTE — Therapy (Signed)
[facility] [address] [place], [name], [address] Phone: [phone]   Fax:  [phone]  Speech Language Pathology Treatment  Patient Details  Name: Tiffany [name] MRN: [id number] Date of Birth: 1950/11/22 No data recorded  Encounter Date: 06/29/2019  End of Session - 06/29/19 1858    Visit Number  4    Number of Visits  17    Date for SLP Re-Evaluation  08/23/19    SLP Start Time  1021    SLP Stop Time   1101    SLP Time Calculation (min)  40 min    Activity Tolerance  Patient tolerated treatment well       Past Medical History:  Diagnosis Date  . Bipolar 1 disorder ([place])   . Breast cancer ([place])   . Chronic diarrhea    loose stools twice a day on average for years  . Chronic kidney disease (CKD), stage III (moderate)   . Depression 1987  . Malignant neoplasm of overlapping sites of left breast in female, estrogen receptor positive ([place]) 03/14/2016   Dx in 09/2014, s/p bilateral mastectomies and ALND, 0/10 LN. 1.4 cm  Grade I invasive lobular, ER and PR +/Her--, Ki 67 <5% Tried anastrozole for one month, but developed suicidal idea  . Parkinson's disease ([place]) 2012  . Psoriatic arthritis ([place])   . PTSD (post-traumatic stress disorder)   . Secondary hyperparathyroidism ([place])   . Tardive dyskinesia     Past Surgical History:  Procedure Laterality Date  . ABDOMINAL HYSTERECTOMY  1987  . CHOLECYSTECTOMY  1979  . DILATION AND CURETTAGE OF UTERUS  1973  . MASTECTOMY Bilateral 09/19/2014  . TONSILLECTOMY  1970  . URETERAL REIMPLANTION Bilateral 1974    There were no vitals filed for this visit.  Subjective Assessment - 06/29/19 1028    Subjective  "I get so excited about being able to do more things I do too much."    Patient is accompained by:  --   wally- husbnd           ADULT SLP TREATMENT - 06/29/19 1026      General Information   Behavior/Cognition  Alert;Cooperative;Pleasant mood       Treatment Provided   Treatment provided  Cognitive-Linquistic      Cognitive-Linquistic Treatment   Treatment focused on  Cognition    Skilled Treatment  "there's still a slight fog that gets in teh way, but it is getting better." "I do feel overwhelmed about things that are going on." Pt told SLP some compenatory strategies she is using- to do lists, looking at the calendar more now than pre-hospitalization. Pt is doing more check-writing now. Pt told SLP she was tyring to do too much sometiems she is becoming very fatigued. SLP suggested writnng a to-do list for the next day and pt said she is already thinking about this, but just not writing it down. Pt shared with SLP feeling overwhelmed by daily tasks so SLP assisted pt reason through need to do therapay tasks instead of expending energy cleaning her house - doing the PT/OT requirements willl get her to her goal of house cleaning, etc quicker and safer than if she focuses on household things. SLP assisted pt in [place] 3-4 daily things to put into checklist. Pt homework is to develop a checklist or to put the therapy items into her planner/calendar each day.       Assessment / Recommendations / Plan   Plan  --  reduce frequency to once/week per pt/husbnd     Progression Toward Goals   Progression toward goals  Progressing toward goals       SLP Education - 06/29/19 1857    Education Details  planning to-do list to lessen anxiety/overwhelmed feelings    Person(s) Educated  Spouse;Patient    Methods  Explanation    Comprehension  Verbalized understanding       SLP Short Term Goals - 06/29/19 1034      SLP SHORT TERM GOAL #1   Title  pt will demo WNL organization in simple - mod complex communicative tasks in 3 sessions with modified independence    Time  2    Period  Weeks    Status  On-going      SLP SHORT TERM GOAL #2   Title  pt will demo error awareness in cognitve linguistic tasks in the therapy session with occaisonal min A  in 3 sessions    Time  2    Period  Weeks    Status  On-going      SLP SHORT TERM GOAL #3   Title  pt will bring a memory compensation device/item to therapy in 5 sessions    Time  2    Period  Weeks    Status  On-going      SLP SHORT TERM GOAL #4   Title  pt will demo selective attention in 5 minutes min-mod noisy environment with rare min A back to task over 3 sessions    Time  2    Period  Weeks    Status  On-going       SLP Long Term Goals - 06/29/19 1900      SLP LONG TERM GOAL #1   Title  Pt will use a memory compensation device to track meds, appointments, errands, recent events, etc with min nonverbal cues in 8 sessions    Time  6    Period  Weeks   or 17 sessions, for all LTGs   Status  On-going      SLP LONG TERM GOAL #2   Title  pt will demo [facility] organization skills for mod complex cogntive linguistic tasks with extra time allowed in 3 sessions    Time  6    Period  Weeks    Status  On-going      SLP LONG TERM GOAL #3   Title  pt will demonstrate anticipatory awareness by spontaneously double checking a detailed written or auditory task in 5 therapy sessions    Time  6    Period  Weeks    Status  On-going      SLP LONG TERM GOAL #4   Title  pt will demo [facility] organization in mod complex communicative tasks in 3 sessions with modified independence    Time  6    Period  Weeks    Status  On-going      SLP LONG TERM GOAL #5   Title  pt will demo simple divided attention in 8 minutes simple-mod complex tasks over three sessions    Time  7    Period  Weeks    Status  On-going       Plan - 06/29/19 1858    Clinical Impression Statement  Pt presents with mild cognitive communication deficits as measured by Cognitive Linguistic Quick Test (CLQT).Pt's exact premorbid deficts other than memory are somewhat unknown. Adjustments to plan were to reduce freqency to once/week.  SLP believes pt will benefit from skilled ST targeting cognitive communication deficits to  maximize pt's skills to commnicate and interact in the home and community.    Speech Therapy Frequency  1x /week   hold for 2 weeks   Duration  --   8 weeks or 17 total sessions   Treatment/Interventions  Environmental controls;Functional tasks;Compensatory techniques;SLP instruction and feedback;Cueing hierarchy;Cognitive reorganization;Internal/external aids;Patient/family education    Potential to Achieve Goals  Good    Potential Considerations  Previous level of function    Consulted and Agree with Plan of Care  Patient;Family member/caregiver    Family Member Consulted  husband       Patient will benefit from skilled therapeutic intervention in order to improve the following deficits and impairments:   Cognitive communication deficit    Problem List Patient Active Problem List   Diagnosis Date Noted  . Chronic diarrhea 05/05/2019  . Bipolar I disorder, most recent episode depressed ([place])   . Transaminitis   . Essential hypertension   . Seizures ([place])   . Encephalopathy 04/26/2019  . Leukocytosis 04/21/2019  . Thrombocytopenia ([place]) 09/04/2018  . Vitamin D deficiency 09/04/2018  . Psoriatic arthritis ([place]) 09/04/2018  . Tubular adenoma of colon 05/28/2017  . 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 Malignant neoplasm of overlapping sites of left breast in female, estrogen receptor positive ([place]) 03/14/2016  . Chronic kidney disease, stage III (moderate) 01/19/2016  . Tardive dyskinesia 10/18/2015  . History of breast cancer left 2016 12/27/2014  . Osteopenia determined by x-ray 10/31/2014  . Rosacea 05/21/2007  . Bipolar affective disorder, mixed ([place]) 08/16/2004  . Acquired hypothyroidism 04/03/1996    [facility] ,[place], [place]  06/29/2019, 7:00 PM  [facility] [address] [place], [name], [address] Phone: [phone]   Fax:  [phone]   Name: Tiffany [name] MRN: [id number] Date of Birth: 12/02/1950

## 2019-06-29 NOTE — Therapy (Signed)
[facility] [address] [place], [name], [address] Phone: [phone]   Fax:  [phone]  Occupational Therapy Treatment  Patient Details  Name: Tiffany [name] MRN: [id number] Date of Birth: Sep 25, 1950 Referring Provider (OT): Dr. Ranell Patrick   Encounter Date: 06/29/2019  OT End of Session - 06/29/19 1212    Visit Number  5    Number of Visits  17    Date for OT Re-Evaluation  07/17/19    Authorization Type  Medicare    Authorization - Visit Number  5    Authorization - Number of Visits  10    Progress Note Due on Visit  10    OT Start Time  1104    OT Stop Time  1145    OT Time Calculation (min)  41 min    Activity Tolerance  Patient tolerated treatment well    Behavior During Therapy  [facility] for tasks assessed/performed       Past Medical History:  Diagnosis Date  . Bipolar 1 disorder ([place])   . Breast cancer ([place])   . Chronic diarrhea    loose stools twice a day on average for years  . Chronic kidney disease (CKD), stage III (moderate)   . Depression 1987  . Malignant neoplasm of overlapping sites of left breast in female, estrogen receptor positive ([place]) 03/14/2016   Dx in 09/2014, s/p bilateral mastectomies and ALND, 0/10 LN. 1.4 cm  Grade I invasive lobular, ER and PR +/Her--, Ki 67 <5% Tried anastrozole for one month, but developed suicidal idea  . Parkinson's disease ([place]) 2012  . Psoriatic arthritis ([place])   . PTSD (post-traumatic stress disorder)   . Secondary hyperparathyroidism ([place])   . Tardive dyskinesia     Past Surgical History:  Procedure Laterality Date  . ABDOMINAL HYSTERECTOMY  1987  . CHOLECYSTECTOMY  1979  . DILATION AND CURETTAGE OF UTERUS  1973  . MASTECTOMY Bilateral 09/19/2014  . TONSILLECTOMY  1970  . URETERAL REIMPLANTION Bilateral 1974    There were no vitals filed for this visit.  Subjective Assessment - 06/29/19 1107    Subjective   Pt denies pain    Pertinent History  69 y.o.  female with medical history significant of hypothyroidism, bipolar 1 disorder, psoriatic arthritis, Parkinsonism, COVID-19 positive on 1/23.  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  Pt received [facility] at [facility] and she was d/c home 05/05/19.    Patient Stated Goals  increase indepdence and balance    Currently in Pain?  No/denies               Treatment: Arm bike x 5 mins level 1 for conditioning, 1 rest break required.            OT Treatment/ Education - 06/29/19 1108    Education Details  Exercise flow sheet, big movements with ADLs, and coordination HEP, see pt instructions.    Person(s) Educated  Patient;Spouse    Methods  Explanation;Demonstration;Verbal cues;Handout    Comprehension  Verbalized understanding;Returned demonstration       OT Short Term Goals - 05/18/19 1443      OT SHORT TERM GOAL #1   Title  I with HEP.    Time  4    Period  Weeks    Status  New    Target Date  06/17/19  OT SHORT TERM GOAL #2   Title  Pt will verbalize understanding of adpated strategies for ADLs/ I ADLs in order to maximize safety and I.    Time  4    Period  Weeks    Status  New      OT SHORT TERM GOAL #3   Title  Pt will demonstrate ability to write a paragraph with 100% legibility and no significant decrease in letter size.    Baseline  Pt reports handwriting becomes smalller when she writes a paragraph    Time  4    Period  Weeks    Status  New      OT SHORT TERM GOAL #4   Title  Pt will demonstrate improved RUE functional reach/ use for ADLS as evidenced by increasing box/ blocks score by 3 blocks.    Time  4    Period  Weeks    Status  New        OT Long Term Goals - 05/18/19 1303      OT LONG TERM GOAL #1   Title  Pt will demonstrate ability to retieve a lightweight object at 125 shoulder flexion with -5 elbow  extension.    Baseline  120, -10    Time  8    Period  Weeks    Status  New    Target Date  07/17/19      OT LONG TERM GOAL #2   Title  Pt will demonstrate ability to retrieve a lightweight object at 120 shoulder flexion with -5 elbow extension.    Baseline  115, -10    Time  8    Period  Weeks    Status  New      OT LONG TERM GOAL #3   Title  Pt will verbalize understanding of ways to keep thinking skills sharp and memory compensations prn.    Time  8    Period  Weeks    Status  New      OT LONG TERM GOAL #4   Title  Pt will perform basic cooking and home management with supervision.    Time  8    Period  Weeks    Status  New            Plan - 06/29/19 1210    Clinical Impression Statement  Pt is progressing towards goals. She demonstrates understanding of coordination HEP.    OT Occupational Profile and History  Detailed Assessment- Review of Records and additional review of physical, cognitive, psychosocial history related to current functional performance    Occupational performance deficits (Please refer to evaluation for details):  ADL's;IADL's;Leisure;Social Participation    Body Structure / Function / Physical Skills  ADL;Balance;Mobility;Strength;Flexibility;FMC;Coordination;GMC;Decreased knowledge of precautions;ROM;Decreased knowledge of use of DME;IADL;Dexterity;Sensation;UE functional use    Youth worker;Understand;Thought    Rehab Potential  Good    Clinical Decision Making  Limited treatment options, no task modification necessary    Comorbidities Affecting Occupational Performance:  May have comorbidities impacting occupational performance    Modification or Assistance to Complete Evaluation   No modification of tasks or assist necessary to complete eval    OT Frequency  2x / week   plus eval, may d/c after 6 weeks dependent upon progress   OT Duration  8 weeks    OT Treatment/Interventions  Self-care/ADL training;Therapeutic exercise;Balance  training;Functional Mobility Training;Aquatic Therapy;Neuromuscular education;Manual Therapy;Therapeutic activities;Energy conservation;Paraffin;DME and/or AE instruction;Cognitive remediation/compensation;Gait Training;Fluidtherapy;Moist Heat;Passive range of motion;Patient/family  education    Plan  check on how big movements with ADLs, and exercise flow sheet are going    Consulted and Agree with Plan of Care  Patient;Family member/caregiver    Family Member Consulted  husband       Patient will benefit from skilled therapeutic intervention in order to improve the following deficits and impairments:   Body Structure / Function / Physical Skills: ADL, Balance, Mobility, Strength, Flexibility, FMC, Coordination, GMC, Decreased knowledge of precautions, ROM, Decreased knowledge of use of DME, IADL, Dexterity, Sensation, UE functional use Cognitive Skills: Memory, Understand, Thought     Visit Diagnosis: Muscle weakness (generalized)  Stiffness of right shoulder, not elsewhere classified  Frontal lobe and executive function deficit  Stiffness of left shoulder, not elsewhere classified    Problem List Patient Active Problem List   Diagnosis Date Noted  . Chronic diarrhea 05/05/2019  . Bipolar I disorder, most recent episode depressed ([place])   . Transaminitis   . Essential hypertension   . Seizures ([place])   . Encephalopathy 04/26/2019  . Leukocytosis 04/21/2019  . Thrombocytopenia ([place]) 09/04/2018  . Vitamin D deficiency 09/04/2018  . Psoriatic arthritis ([place]) 09/04/2018  . Tubular adenoma of colon 05/28/2017  . 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 Malignant neoplasm of overlapping sites of left breast in female, estrogen receptor positive ([place]) 03/14/2016  . Chronic kidney disease, stage III (moderate) 01/19/2016  . Tardive dyskinesia 10/18/2015  . History of breast cancer left 2016 12/27/2014  . Osteopenia determined by x-ray  10/31/2014  . Rosacea 05/21/2007  . Bipolar affective disorder, mixed ([place]) 08/16/2004  . Acquired hypothyroidism 04/03/1996    Tiffany [name] 06/29/2019, 12:13 PM  [facility] [address] [place], [name], [address] Phone: [phone]   Fax:  [phone]  Name: Tiffany [name] MRN: [id number] Date of Birth: September 19, 1950

## 2019-06-29 NOTE — Patient Instructions (Signed)

## 2019-06-29 NOTE — Patient Instructions (Addendum)
Exercise Genevie Ann Thurs Friday Saturday Sunday                                                                                                        Performing Daily Activities with Big Movements  Pick at least one activity a day and perform with BIG, DELIBERATE movements/effort. This can make the activity easier and turn daily activities into exercise!  If you are standing during the activity, make sure to keep feet apart and stand with good/big/PWR! UP posture.  Examples:  Dressing - Push arms in sleeves, twist when putting on jacket, push foot into pants, open hands to pull down shirt/put on socks/pull up pants  Bathing - Wash/dry with long strokes  Brushing your teeth - Big, slow movements  Cutting food - Long deliberate cuts  Opening jar/bottle - Move as much as you can with each turn  Picking up a cup/bottle - Open hand up big and get object all the way in palm  Hanging up clothes/getting clothes down from closet - Reach with big effort  Putting away groceries/dishes - Reach with big effort  Wiping counter/table - Move in big, long strokes  Stirring while cooking - Exaggerate movement  Vacuuming - Push with big movement  Folding clothes - Exaggerate arm movements  Changing light bulb - Move as much as you can with each turn  Using a screwdriver - Move as much as you can with each turn  Walking into a store/restaurant - Walk with big steps, swing arms if able    Standing up from a chair/recliner/sofa - Scoot forward, lean forward, and stand with big effort                          Coordination Exercises  Perform the following exercises for 20 minutes 1 times per day. Perform with both hand(s). Perform using big movements.   Flipping Cards: Place deck of cards on the table. Flip cards over by opening your hand big to grasp and then turn your palm up big.  Deal cards: Hold 1/2 or whole deck in your hand.  Use thumb to push card off top of deck with one big push.  Rotate ball with fingertips: Pick up with fingers/thumb and move as much as you can with each turn/movement (clockwise and counter-clockwise).  Toss ball from one hand to the other: Toss big/high.  Toss ball in the air and catch with the same hand: Toss big/high.  Pick up coins and place in coin bank or container: Pick up with big, intentional movements. Do not drag coin to the edge.  Pick up coins and stack one at a time: Pick up with big, intentional movements. Do not drag coin to the edge. (5-10 in a stack)  Pick up 5-10 coins one at a time and hold in palm. Then, move coins from palm to fingertips one at time and place in coin bank/container.  Practice writing: Slow down, write big, and focus on forming each letter.  Perform "Flicks"/hand stretches (PWR!  Hands): Close hands then flick out your fingers with focus on opening hands, pulling wrists back, and extending elbows like you are pushing.

## 2019-06-30 ENCOUNTER — Ambulatory Visit (INDEPENDENT_AMBULATORY_CARE_PROVIDER_SITE_OTHER): Payer: Medicare Other | Admitting: Psychology

## 2019-06-30 ENCOUNTER — Encounter: Payer: Self-pay | Admitting: Physical Therapy

## 2019-06-30 DIAGNOSIS — F314 Bipolar disorder, current episode depressed, severe, without psychotic features: Secondary | ICD-10-CM

## 2019-06-30 NOTE — Therapy (Signed)
[facility] [address] [place], [name], [address] Phone: [phone]   Fax:  [phone]  Physical Therapy Treatment  Patient Details  Name: Tiffany [name] MRN: [id number] Date of Birth: 09-May-1950 Referring Provider (PT): Mar[place]e [place], [place]   Encounter Date: 06/29/2019  PT End of Session - 06/29/19 0942    Visit Number  5    Number of Visits  17    Date for PT Re-Evaluation  22/02/54   90 day recert for 8 wk POC   Authorization Type  Medicare/[employer]- will need 10th visit progress note    Progress Note Due on Visit  10    PT Start Time  0940    PT Stop Time  1018    PT Time Calculation (min)  38 min    Equipment Utilized During Treatment  Gait belt    Activity Tolerance  Patient tolerated treatment well   several seated rest breaks; fatigued by end of session   Behavior During Therapy  [facility] for tasks assessed/performed   Tearful at beginning of session      Past Medical History:  Diagnosis Date  . Bipolar 1 disorder ([place])   . Breast cancer ([place])   . Chronic diarrhea    loose stools twice a day on average for years  . Chronic kidney disease (CKD), stage III (moderate)   . Depression 1987  . Malignant neoplasm of overlapping sites of left breast in female, estrogen receptor positive ([place]) 03/14/2016   Dx in 09/2014, s/p bilateral mastectomies and ALND, 0/10 LN. 1.4 cm  Grade I invasive lobular, ER and PR +/Her--, Ki 67 <5% Tried anastrozole for one month, but developed suicidal idea  . Parkinson's disease ([place]) 2012  . Psoriatic arthritis ([place])   . PTSD (post-traumatic stress disorder)   . Secondary hyperparathyroidism ([place])   . Tardive dyskinesia     Past Surgical History:  Procedure Laterality Date  . ABDOMINAL HYSTERECTOMY  1987  . CHOLECYSTECTOMY  1979  . DILATION AND CURETTAGE OF UTERUS  1973  . MASTECTOMY Bilateral 09/19/2014  . TONSILLECTOMY  1970  . URETERAL REIMPLANTION Bilateral 1974    There were  no vitals filed for this visit.  Subjective Assessment - 06/29/19 0943    Subjective  Did alot around the house yesterday, and just don't know when to stop, so that's why I'm sore.    Patient is accompained by:  Family member    Pertinent History  YHC:[id number], bipolar 1 disorder, psoriatic arthritis, Parkinsonism (medication-induced), COVID-19 positive on 04/03/2019    Patient Stated Goals  Pt's goals for therapy are to improve strength and balance.    Currently in Pain?  Yes    Pain Score  5     Pain Location  Hip    Pain Orientation  Right;Left    Pain Descriptors / Indicators  Aching;Sore    Pain Type  Acute pain    Pain Onset  Yesterday    Pain Frequency  Intermittent    Aggravating Factors   too much activity    Pain Relieving Factors  unsure                       [facility] Adult PT Treatment/Exercise - 06/29/19 0950      Transfers   Transfers  Sit to Stand;Stand to Sit    Sit to Stand  With upper extremity assist;5: Supervision;From chair/3-in-1    Five time sit to stand comments  16.93    Stand to Sit  5: Supervision;Without upper extremity assist;To chair/3-in-1      Ambulation/Gait   Ambulation/Gait  Yes    Ambulation/Gait Assistance  5: Supervision    Ambulation Distance (Feet)  400 Feet   additional 230 ft   Assistive device  Rollator    Gait Pattern  Step-through pattern;Trunk flexed    Ambulation Surface  Level;Indoor    Gait Comments  Initial cues for increased step length and posture.  Gait with environmental scanning tasks with rollator, no LOB or veering noted.      Standardized Balance Assessment   Standardized Balance Assessment  Timed Up and Go Test      Timed Up and Go Test   TUG  Normal TUG    Normal TUG (seconds)  17.5      Self-Care   Self-Care  Other Self-Care Comments    Other Self-Care Comments   Provided and discussed fall prevention information in home environment.  Also discussed activity levels and trying not to overdo  one day and be too sore for activitiy the following day.      Exercises   Exercises  --      Ankle Exercises: Standing   Other Standing Ankle Exercises  Heel/toe raises x 10 reps each      Reviewed HEP from last visit:   Side step and weightshift at counter x 10 reps Alternating step taps to cabinet shelf, 2 sets x 10 reps  Educated pt that she can increase reps to 2-3 sets of 10.    Balance Exercises - 06/29/19 0951      Balance Exercises: Standing   Sidestepping  Upper extremity support;2 reps    Other Standing Exercises  Wide BOS lateral weigthshifting, with added reaching x 10 reps (pt has c/o R knee pain towards end of set).  Stagger stance forward/back weightshifting x 10 reps with UE support at counter.  Explained how these positions could be utilized for optimal balance with standing functional activities at home.        PT Education - 06/30/19 0911    Education Details  Fall prevention education, progress towards goals    Person(s) Educated  Patient;Spouse    Methods  Explanation;Handout    Comprehension  Verbalized understanding       PT Short Term Goals - 06/30/19 0912      PT SHORT TERM GOAL #1   Title  Pt will perform HEP with family supervision for improved strength, balance, and gait.  TARGET:  06/18/2019    Time  4    Period  Weeks    Status  Achieved      PT SHORT TERM GOAL #2   Title  Pt will improve TUG to less than or equal to 15 seconds for decreased fall risk.    Baseline  17.5 sec 06/30/2019    Time  4    Period  Weeks    Status  Not Met      PT SHORT TERM GOAL #3   Title  Berg Balance test to be assessed, with goal to be written as appropriate.    Baseline  scored 47/56 on 06/10/19 - LTG written as appropriate.    Time  4    Period  Weeks    Status  Achieved      PT SHORT TERM GOAL #4   Title  Pt will improve 5x sit<>stand to less than or equal to 18 seconds for improved  functional strength.    Baseline  16.93 sec 06/30/2019    Time  4     Period  Weeks    Status  Achieved      PT SHORT TERM GOAL #5   Title  Pt/husband will verbalize understanding of fall prevention in home environment.    Time  4    Period  Weeks    Status  Achieved        PT Long Term Goals - 06/30/19 0919      PT LONG TERM GOAL #1   Title  Pt will perform HEP with supervision of family for improved balance, strength, gait.  Updates TARGET (due to initial delay in scheduline):   07/30/2019    Time  8    Period  Weeks    Status  On-going      PT LONG TERM GOAL #2   Title  Pt will improve gait velocity to at least 2 ft/sec for improved gait efficiency and safety in the community.    Time  8    Period  Weeks    Status  On-going      PT LONG TERM GOAL #3   Title  Pt will improve BERG to at least 50/56 to demo decr fall risk.    Baseline  47/56 on 06/10/19    Time  8    Period  Weeks    Status  On-going      PT LONG TERM GOAL #4   Title  Pt will improve 5x sit<>stand to less than or equal to 15 seconds for improved functional strength.    Time  8    Period  Weeks    Status  On-going      PT LONG TERM GOAL #5   Title  Pt will ambulate at least 1000 ft, modified independently with rollator, indoor and outdoor surfaces, for return to community mobility.    Time  8    Period  Weeks    Status  On-going            Plan - 06/30/19 0915    Clinical Impression Statement  Due to pt's delay in scheduling and missing some appointments due to cancellations, this is week 4 in her POC.  STGs assessed this visit, with pt meeting 4 of 5 STGs.  STG 2 not met for TUG score, but pt has improved, just not to goal level.  She is demonstrating improved functional strength and endurance and appears to be performing HEP as well as other household activities.  She does have soreness when "overdoing it" with activities at home, and pt/husband education in modulating activity level to avoid pain and less activity the following day.  She will continue to beneift from  skilled PT, as she is still at fall risk and demonstrates decreased functional strength.    Personal Factors and Comorbidities  Comorbidity 3+    Comorbidities  See above    Examination-Activity Limitations  Locomotion Level;Transfers;Stand    Examination-Participation Restrictions  Church;Community Activity    Stability/Clinical Decision Making  Evolving/Moderate complexity    Rehab Potential  Good    PT Frequency  2x / week    PT Duration  8 weeks   plus eval   PT Treatment/Interventions  ADLs/Self Care Home Management;DME Instruction;Neuromuscular re-education;Balance training;Therapeutic activities;Therapeutic exercise;Functional mobility training;Gait training;Patient/family education    PT Next Visit Plan  Continue gait training with rollator (outside for longer distances); work on balance at counter and  in corner.  Work towards [employer] and Agree with Plan of Care  Patient;Family member/caregiver    Family Member Consulted  Husband       Patient will benefit from skilled therapeutic intervention in order to improve the following deficits and impairments:  Abnormal gait, Decreased balance, Decreased mobility, Decreased strength, Postural dysfunction, Other (comment)(Pain (due to psoriatic arthritis), not to be specifically addressed due to chronic issue)  Visit Diagnosis: Other abnormalities of gait and mobility  Unsteadiness on feet     Problem List Patient Active Problem List   Diagnosis Date Noted  . Chronic diarrhea 05/05/2019  . Bipolar I disorder, most recent episode depressed ([place])   . Transaminitis   . Essential hypertension   . Seizures ([place])   . Encephalopathy 04/26/2019  . Leukocytosis 04/21/2019  . Thrombocytopenia ([name]) 09/04/2018  . Vitamin D deficiency 09/04/2018  . Psoriatic arthritis ([place]) 09/04/2018  . Tubular adenoma of colon 05/28/2017  . 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 Malignant neoplasm of  overlapping sites of left breast in female, estrogen receptor positive ([place]) 03/14/2016  . Chronic kidney disease, stage III (moderate) 01/19/2016  . Tardive dyskinesia 10/18/2015  . History of breast cancer left 2016 12/27/2014  . Osteopenia determined by x-ray 10/31/2014  . Rosacea 05/21/2007  . Bipolar affective disorder, mixed ([place]) 08/16/2004  . Acquired hypothyroidism 04/03/1996    Chauncy Mangiaracina W. 06/30/2019, 9:21 AM  Frazier Butt., PT   [facility] [address] [place], [name], [address] Phone: [phone]   Fax:  [phone]  Name: Daley Mooradian MRN: [id number] Date of Birth: 08/01/1950

## 2019-07-02 ENCOUNTER — Other Ambulatory Visit: Payer: Self-pay

## 2019-07-02 ENCOUNTER — Ambulatory Visit: Payer: Medicare Other

## 2019-07-02 ENCOUNTER — Encounter: Payer: Self-pay | Admitting: Occupational Therapy

## 2019-07-02 ENCOUNTER — Ambulatory Visit: Payer: Medicare Other | Admitting: Physical Therapy

## 2019-07-02 ENCOUNTER — Ambulatory Visit: Payer: Medicare Other | Admitting: Occupational Therapy

## 2019-07-02 DIAGNOSIS — R29818 Other symptoms and signs involving the nervous system: Secondary | ICD-10-CM

## 2019-07-02 DIAGNOSIS — R278 Other lack of coordination: Secondary | ICD-10-CM | POA: Diagnosis not present

## 2019-07-02 DIAGNOSIS — R2681 Unsteadiness on feet: Secondary | ICD-10-CM

## 2019-07-02 DIAGNOSIS — M25612 Stiffness of left shoulder, not elsewhere classified: Secondary | ICD-10-CM

## 2019-07-02 DIAGNOSIS — M25611 Stiffness of right shoulder, not elsewhere classified: Secondary | ICD-10-CM

## 2019-07-02 DIAGNOSIS — M6281 Muscle weakness (generalized): Secondary | ICD-10-CM

## 2019-07-02 DIAGNOSIS — R2689 Other abnormalities of gait and mobility: Secondary | ICD-10-CM

## 2019-07-02 DIAGNOSIS — R41844 Frontal lobe and executive function deficit: Secondary | ICD-10-CM

## 2019-07-02 NOTE — Therapy (Signed)
[facility] [address] [place], [name], [address] Phone: [phone]   Fax:  [phone]  Occupational Therapy Treatment  Patient Details  Name: Tiffany [name] MRN: [id number] Date of Birth: 1951/01/23 Referring Provider (OT): Dr. Ranell Patrick   Encounter Date: 07/02/2019  OT End of Session - 07/02/19 0956    Visit Number  6    Number of Visits  17    Date for OT Re-Evaluation  07/17/19    Authorization Type  Medicare    Authorization - Visit Number  6    Authorization - Number of Visits  10    Progress Note Due on Visit  10    OT Start Time  0935    OT Stop Time  1013    OT Time Calculation (min)  38 min    Activity Tolerance  Patient tolerated treatment well    Behavior During Therapy  [facility] for tasks assessed/performed       Past Medical History:  Diagnosis Date  . Bipolar 1 disorder ([place])   . Breast cancer ([place])   . Chronic diarrhea    loose stools twice a day on average for years  . Chronic kidney disease (CKD), stage III (moderate)   . Depression 1987  . Malignant neoplasm of overlapping sites of left breast in female, estrogen receptor positive ([place]) 03/14/2016   Dx in 09/2014, s/p bilateral mastectomies and ALND, 0/10 LN. 1.4 cm  Grade I invasive lobular, ER and PR +/Her--, Ki 67 <5% Tried anastrozole for one month, but developed suicidal idea  . Parkinson's disease ([place]) 2012  . Psoriatic arthritis ([place])   . PTSD (post-traumatic stress disorder)   . Secondary hyperparathyroidism ([place])   . Tardive dyskinesia     Past Surgical History:  Procedure Laterality Date  . ABDOMINAL HYSTERECTOMY  1987  . CHOLECYSTECTOMY  1979  . DILATION AND CURETTAGE OF UTERUS  1973  . MASTECTOMY Bilateral 09/19/2014  . TONSILLECTOMY  1970  . URETERAL REIMPLANTION Bilateral 1974    There were no vitals filed for this visit.  Subjective Assessment - 07/02/19 0935    Pertinent History  69 y.o. female with medical history  significant of hypothyroidism, bipolar 1 disorder, psoriatic arthritis, Parkinsonism, COVID-19 positive on 1/23.  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  Pt [facility] at [facility] and she was d/c home 05/05/19.    Patient Stated Goals  increase indepdence and balance             Treatment: Supine closed chain shoulder flexion/ chest press 10 reps each, min v.c then PWR! up, rock and twist in supine x 10 reps, min v.c Standing to perform dynamic step and reach to flip large playing cards min v.c/ min A for balance, several rest breaks required. Discussed use of a chore chart to balance work and rest periods at home, pt/ husband verbalize understanding.                OT Short Term Goals - 05/18/19 1443      OT SHORT TERM GOAL #1   Title  I with HEP.    Time  4    Period  Weeks    Status  New    Target Date  06/17/19      OT SHORT TERM GOAL #2   Title  Pt will verbalize understanding of  adpated strategies for ADLs/ I ADLs in order to maximize safety and I.    Time  4    Period  Weeks    Status  New      OT SHORT TERM GOAL #3   Title  Pt will demonstrate ability to write a paragraph with 100% legibility and no significant decrease in letter size.    Baseline  Pt reports handwriting becomes smalller when she writes a paragraph    Time  4    Period  Weeks    Status  New      OT SHORT TERM GOAL #4   Title  Pt will demonstrate improved RUE functional reach/ use for ADLS as evidenced by increasing box/ blocks score by 3 blocks.    Time  4    Period  Weeks    Status  New        OT Long Term Goals - 07/02/19 1009      OT LONG TERM GOAL #1   Title  Pt will demonstrate ability to retieve a lightweight object at 125 shoulder flexion with -5 elbow extension.    Baseline  120, -10    Time  8    Period  Weeks    Status  On-going       OT LONG TERM GOAL #2   Title  Pt will demonstrate ability to retrieve a lightweight object at 120 shoulder flexion with -5 elbow extension.    Baseline  115, -10    Time  8    Period  Weeks    Status  On-going      OT LONG TERM GOAL #3   Title  Pt will verbalize understanding of ways to keep thinking skills sharp and memory compensations prn.    Time  8    Period  Weeks    Status  On-going      OT LONG TERM GOAL #4   Title  Pt will perform basic cooking and home management with supervision.    Time  8    Period  Weeks    Status  On-going            Plan - 07/02/19 0957    Clinical Impression Statement  Pt is progressing towards goals. She demonstrates improving overall endurance.    OT Occupational Profile and History  Detailed Assessment- Review of Records and additional review of physical, cognitive, psychosocial history related to current functional performance    Occupational performance deficits (Please refer to evaluation for details):  ADL's;IADL's;Leisure;Social Participation    Body Structure / Function / Physical Skills  ADL;Balance;Mobility;Strength;Flexibility;FMC;Coordination;GMC;Decreased knowledge of precautions;ROM;Decreased knowledge of use of DME;IADL;Dexterity;Sensation;UE functional use    Youth worker;Understand;Thought    Rehab Potential  Good    Clinical Decision Making  Limited treatment options, no task modification necessary    Comorbidities Affecting Occupational Performance:  May have comorbidities impacting occupational performance    Modification or Assistance to Complete Evaluation   No modification of tasks or assist necessary to complete eval    OT Frequency  2x / week   plus eval, may d/c after 6 weeks dependent upon progress   OT Duration  8 weeks    OT Treatment/Interventions  Self-care/ADL training;Therapeutic exercise;Balance training;Functional Mobility Training;Aquatic Therapy;Neuromuscular education;Manual  Therapy;Therapeutic activities;Energy conservation;Paraffin;DME and/or AE instruction;Cognitive remediation/compensation;Gait Training;Fluidtherapy;Moist Heat;Passive range of motion;Patient/family education    Plan  functional activity with big movements    Consulted and Agree with Plan of Care  Patient;Family member/caregiver    Family Member Consulted  husband       Patient will benefit from skilled therapeutic intervention in order to improve the following deficits and impairments:   Body Structure / Function / Physical Skills: ADL, Balance, Mobility, Strength, Flexibility, FMC, Coordination, GMC, Decreased knowledge of precautions, ROM, Decreased knowledge of use of DME, IADL, Dexterity, Sensation, UE functional use Cognitive Skills: Memory, Understand, Thought     Visit Diagnosis: Muscle weakness (generalized)  Stiffness of right shoulder, not elsewhere classified  Frontal lobe and executive function deficit  Stiffness of left shoulder, not elsewhere classified  Other lack of coordination  Other symptoms and signs involving the nervous system    Problem List Patient Active Problem List   Diagnosis Date Noted  . Chronic diarrhea 05/05/2019  . Bipolar I disorder, most recent episode depressed ([place])   . Transaminitis   . Essential hypertension   . Seizures ([place])   . Encephalopathy 04/26/2019  . Leukocytosis 04/21/2019  . Thrombocytopenia ([place]) 09/04/2018  . Vitamin D deficiency 09/04/2018  . Psoriatic arthritis ([place]) 09/04/2018  . Tubular adenoma of colon 05/28/2017  . 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 Malignant neoplasm of overlapping sites of left breast in female, estrogen receptor positive ([place]) 03/14/2016  . Chronic kidney disease, stage III (moderate) 01/19/2016  . Tardive dyskinesia 10/18/2015  . History of breast cancer left 2016 12/27/2014  . Osteopenia determined by x-ray 10/31/2014  . Rosacea 05/21/2007  .  Bipolar affective disorder, mixed ([place]) 08/16/2004  . Acquired hypothyroidism 04/03/1996    Jude Naclerio 07/02/2019, 3:42 PM  [facility] [address] [place], [name], [address] Phone: [phone]   Fax:  [phone]  Name: Tiffany [name] MRN: [id number] Date of Birth: 16-Nov-1950

## 2019-07-02 NOTE — Therapy (Signed)
[facility] [address] [place], [name], [address] Phone: [phone]   Fax:  [phone]  Physical Therapy Treatment  Patient Details  Name: Tiffany [name] MRN: [id number] Date of Birth: February 02, 1951 Referring Provider (PT): Marlowe Shores, [place]   Encounter Date: 07/02/2019  PT End of Session - 07/02/19 1451    Visit Number  6    Number of Visits  17    Date for PT Re-Evaluation  57/90/38   90 day recert for 8 wk POC   Authorization Type  Medicare/[employer]ARP- will need 10th visit progress note    Progress Note Due on Visit  10    PT Start Time  1016    PT Stop Time  1057    PT Time Calculation (min)  41 min    Equipment Utilized During Treatment  Gait belt    Activity Tolerance  Patient tolerated treatment well   several seated rest breaks   Behavior During Therapy  [facility] for tasks assessed/performed   Tearful at beginning of session      Past Medical History:  Diagnosis Date  . Bipolar 1 disorder ([place])   . Breast cancer ([place])   . Chronic diarrhea    loose stools twice a day on average for years  . Chronic kidney disease (CKD), stage III (moderate)   . Depression 1987  . Malignant neoplasm of overlapping sites of left breast in female, estrogen receptor positive ([place]) 03/14/2016   Dx in 09/2014, s/p bilateral mastectomies and ALND, 0/10 LN. 1.4 cm  Grade I invasive lobular, ER and PR +/Her--, Ki 67 <5% Tried anastrozole for one month, but developed suicidal idea  . Parkinson's disease ([place]) 2012  . Psoriatic arthritis ([place])   . PTSD (post-traumatic stress disorder)   . Secondary hyperparathyroidism ([place])   . Tardive dyskinesia     Past Surgical History:  Procedure Laterality Date  . ABDOMINAL HYSTERECTOMY  1987  . CHOLECYSTECTOMY  1979  . DILATION AND CURETTAGE OF UTERUS  1973  . MASTECTOMY Bilateral 09/19/2014  . TONSILLECTOMY  1970  . URETERAL REIMPLANTION Bilateral 1974    There were no vitals filed for this  visit.  Subjective Assessment - 07/02/19 1018    Subjective  Had a little back pain earlier today, but with the OT (supine PWR!) exercises, I feel all better.  Need to try to pace myself better.    Patient is accompained by:  Family member    Pertinent History  BFX:OVANVBTYOMAYOK, bipolar 1 disorder, psoriatic arthritis, Parkinsonism (medication-induced), COVID-19 positive on 04/03/2019    Patient Stated Goals  Pt's goals for therapy are to improve strength and balance.    Currently in Pain?  No/denies    Pain Onset  [name] Adult PT Treatment/Exercise - 07/02/19 1019      Transfers   Transfers  Sit to Stand;Stand to Sit    Sit to Stand  With upper extremity assist;5: Supervision;From bed    Stand to Sit  5: Supervision;Without upper extremity assist;To bed    Number of Reps  2 sets;Other reps (comment)   5 reps   Comments  Additional 5 reps of sit<>stand throughout PT session.  Training/education in rollator placement with transfer to NuStep, to avoid mis-stepping and almost tripping over rollator wheel.        Ambulation/Gait   Ambulation/Gait  Yes  Ambulation/Gait Assistance  5: Supervision    Ambulation/Gait Assistance Details  Cues for increased heelstrike and step length with gait    Ambulation Distance (Feet)  80 Feet   x 3 reps   Assistive device  Rollator    Gait Pattern  Step-through pattern;Trunk flexed   decreased heelstrike   Ambulation Surface  Level;Indoor      Knee/Hip Exercises: Aerobic   Nustep  NuStep, Level 1, 4 extremities x 5 minutes for flexibility and lower extremity strengthening.          Balance Exercises - 07/02/19 1028      Balance Exercises: Standing   Wall Bumps  Hip;Eyes opened;10 reps   At counter for hip strategy, with UE support   Stepping Strategy  Anterior;Posterior;Lateral;10 reps;UE support   Forward/back step and weightshifting x 10 reps   Heel Raises  Both;10 reps    Toe Raise   Both;10 reps    Other Standing Exercises  Wide BOS lateral weigthshifting, through hips x 10 reps, then with added reaching x 10 reps.  Stagger stance forward/back weightshifting x 10 reps with UE support at counter.  Explained how these positions could be utilized for optimal balance with standing functional activities at home.        PT Education - 07/02/19 1450    Education Details  Updates to HEP-see instructions    Person(s) Educated  Patient;Spouse    Methods  Explanation;Demonstration;Handout    Comprehension  Verbalized understanding;Returned demonstration;Verbal cues required       PT Short Term Goals - 06/30/19 0912      PT SHORT TERM GOAL #1   Title  Pt will perform HEP with family supervision for improved strength, balance, and gait.  TARGET:  06/18/2019    Time  4    Period  Weeks    Status  Achieved      PT SHORT TERM GOAL #2   Title  Pt will improve TUG to less than or equal to 15 seconds for decreased fall risk.    Baseline  17.5 sec 06/30/2019    Time  4    Period  Weeks    Status  Not Met      PT SHORT TERM GOAL #3   Title  Berg Balance test to be assessed, with goal to be written as appropriate.    Baseline  scored 47/56 on 06/10/19 - LTG written as appropriate.    Time  4    Period  Weeks    Status  Achieved      PT SHORT TERM GOAL #4   Title  Pt will improve 5x sit<>stand to less than or equal to 18 seconds for improved functional strength.    Baseline  16.93 sec 06/30/2019    Time  4    Period  Weeks    Status  Achieved      PT SHORT TERM GOAL #5   Title  Pt/husband will verbalize understanding of fall prevention in home environment.    Time  4    Period  Weeks    Status  Achieved        PT Long Term Goals - 06/30/19 0919      PT LONG TERM GOAL #1   Title  Pt will perform HEP with supervision of family for improved balance, strength, gait.  Updates TARGET (due to initial delay in scheduline):   07/30/2019    Time  8    Period  Weeks  Status   On-going      PT LONG TERM GOAL #2   Title  Pt will improve gait velocity to at least 2 ft/sec for improved gait efficiency and safety in the community.    Time  8    Period  Weeks    Status  On-going      PT LONG TERM GOAL #3   Title  Pt will improve BERG to at least 50/56 to demo decr fall risk.    Baseline  47/56 on 06/10/19    Time  8    Period  Weeks    Status  On-going      PT LONG TERM GOAL #4   Title  Pt will improve 5x sit<>stand to less than or equal to 15 seconds for improved functional strength.    Time  8    Period  Weeks    Status  On-going      PT LONG TERM GOAL #5   Title  Pt will ambulate at least 1000 ft, modified independently with rollator, indoor and outdoor surfaces, for return to community mobility.    Time  8    Period  Weeks    Status  On-going            Plan - 07/02/19 1452    Clinical Impression Statement  Focused session today on standing balance activities, incorporating weigthsfhiting, as well as ankle, hip, step strategies for balance.  Pt continues to require occasional seated rest breaks with fatigue, but overall able to perform activities well through session.  Will continue to work towards [place] for improved overall funcitonal mobility and decreased fall risk.    Personal Factors and Comorbidities  Comorbidity 3+    Comorbidities  See above    Examination-Activity Limitations  Locomotion Level;Transfers;Stand    Examination-Participation Restrictions  Church;Community Activity    Stability/Clinical Decision Making  Evolving/Moderate complexity    Rehab Potential  Good    PT Frequency  2x / week    PT Duration  8 weeks   plus eval   PT Treatment/Interventions  ADLs/Self Care Home Management;DME Instruction;Neuromuscular re-education;Balance training;Therapeutic activities;Therapeutic exercise;Functional mobility training;Gait training;Patient/family education    PT Next Visit Plan  Continue gait training with rollator (outside for longer  distances); work on balance at counter and in corner; review updates to HEP.  Work towards [employer] and Agree with Plan of Care  Patient;Family member/caregiver    Family Member Consulted  Husband       Patient will benefit from skilled therapeutic intervention in order to improve the following deficits and impairments:  Abnormal gait, Decreased balance, Decreased mobility, Decreased strength, Postural dysfunction, Other (comment)(Pain (due to psoriatic arthritis), not to be specifically addressed due to chronic issue)  Visit Diagnosis: Unsteadiness on feet  Other abnormalities of gait and mobility  Muscle weakness (generalized)     Problem List Patient Active Problem List   Diagnosis Date Noted  . Chronic diarrhea 05/05/2019  . Bipolar I disorder, most recent episode depressed ([place])   . Transaminitis   . Essential hypertension   . Seizures ([place])   . Encephalopathy 04/26/2019  . Leukocytosis 04/21/2019  . Thrombocytopenia ([place]) 09/04/2018  . Vitamin D deficiency 09/04/2018  . Psoriatic arthritis ([place]) 09/04/2018  . Tubular adenoma of colon 05/28/2017  . 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 Malignant neoplasm of overlapping sites of left breast in female, estrogen receptor positive ([place]) 03/14/2016  .  Chronic kidney disease, stage III (moderate) 01/19/2016  . Tardive dyskinesia 10/18/2015  . History of breast cancer left 2016 12/27/2014  . Osteopenia determined by x-ray 10/31/2014  . Rosacea 05/21/2007  . Bipolar affective disorder, mixed ([place]) 08/16/2004  . Acquired hypothyroidism 04/03/1996    Sudie Bandel W. 07/02/2019, 2:56 PM  Frazier Butt., PT   [facility] [address] [place], [name], [address] Phone: [phone]   Fax:  [phone]  Name: Tiffany [name] MRN: [id number] Date of Birth: [birth year]

## 2019-07-02 NOTE — Patient Instructions (Addendum)
Access Code: [email or web address] URL: [email or web address] Date: 07/02/2019 Prepared by: Mady Haagensen  Exercises Seated Hamstring Stretch - 1 x daily - 4-5 x weekly - 3 sets - 15 hold Seated March - 1 x daily - 4-5 x weekly - 3 sets - 10 reps Sit to Stand with Armchair - 1-2 x daily - 4-5 x weekly - 2 sets - 5 reps Seated Long Arc Quad - 1-2 x daily - 4-5 x weekly - 2 sets - 5 reps Seated Heel Toe Raises - 1 x daily - 4-5 x weekly - 2 sets - 10 reps Side Stepping with Counter Support - 1 x daily - 4 x weekly - 1 sets - 10 reps Alternating Step Taps with Counter Support - 1 x daily - 4 x weekly - 1 sets - 10 reps  Added 07/02/2019 Lateral Weight Shift with Arm Raise - 1 x daily - 5 x weekly - 1-2 sets - 10 reps Staggered Stance Forward Backward Weight Shift with Counter Support - 1 x daily - 5 x weekly - 1-2 sets - 10 reps

## 2019-07-05 ENCOUNTER — Ambulatory Visit (INDEPENDENT_AMBULATORY_CARE_PROVIDER_SITE_OTHER): Payer: Medicare Other | Admitting: Psychology

## 2019-07-05 DIAGNOSIS — F314 Bipolar disorder, current episode depressed, severe, without psychotic features: Secondary | ICD-10-CM

## 2019-07-06 ENCOUNTER — Ambulatory Visit: Payer: Medicare Other | Admitting: Physical Therapy

## 2019-07-06 ENCOUNTER — Other Ambulatory Visit: Payer: Self-pay

## 2019-07-06 ENCOUNTER — Encounter: Payer: Self-pay | Admitting: Physical Therapy

## 2019-07-06 ENCOUNTER — Ambulatory Visit: Payer: Medicare Other

## 2019-07-06 ENCOUNTER — Ambulatory Visit: Payer: Medicare Other | Admitting: Occupational Therapy

## 2019-07-06 DIAGNOSIS — M25611 Stiffness of right shoulder, not elsewhere classified: Secondary | ICD-10-CM

## 2019-07-06 DIAGNOSIS — M6281 Muscle weakness (generalized): Secondary | ICD-10-CM

## 2019-07-06 DIAGNOSIS — R41844 Frontal lobe and executive function deficit: Secondary | ICD-10-CM

## 2019-07-06 DIAGNOSIS — R2689 Other abnormalities of gait and mobility: Secondary | ICD-10-CM

## 2019-07-06 DIAGNOSIS — R2681 Unsteadiness on feet: Secondary | ICD-10-CM

## 2019-07-06 DIAGNOSIS — M25612 Stiffness of left shoulder, not elsewhere classified: Secondary | ICD-10-CM

## 2019-07-06 DIAGNOSIS — R278 Other lack of coordination: Secondary | ICD-10-CM | POA: Diagnosis not present

## 2019-07-06 DIAGNOSIS — R29818 Other symptoms and signs involving the nervous system: Secondary | ICD-10-CM

## 2019-07-06 DIAGNOSIS — R41841 Cognitive communication deficit: Secondary | ICD-10-CM

## 2019-07-06 NOTE — Therapy (Signed)
[facility] [address] [place], [name], [address] Phone: [phone]   Fax:  [phone]  Physical Therapy Treatment  Patient Details  Name: Tiffany [name] MRN: [id number] Date of Birth: August 10, 1950 Referring Provider (PT): Marlowe Shores, [place]   Encounter Date: 07/06/2019  PT End of Session - 07/06/19 1448    Visit Number  7    Number of Visits  17    Date for PT Re-Evaluation  86/76/19   90 day recert for 8 wk POC   Authorization Type  Medicare/AARP- will need 10th visit progress note    Progress Note Due on Visit  10    PT Start Time  0935    PT Stop Time  1013    PT Time Calculation (min)  38 min    Equipment Utilized During Treatment  Gait belt    Activity Tolerance  Patient tolerated treatment well   several seated rest breaks   Behavior During Therapy  [facility] for tasks assessed/performed   Tearful at beginning of session      Past Medical History:  Diagnosis Date  . Bipolar 1 disorder ([place])   . Breast cancer ([place])   . Chronic diarrhea    loose stools twice a day on average for years  . Chronic kidney disease (CKD), stage III (moderate)   . Depression 1987  . Malignant neoplasm of overlapping sites of left breast in female, estrogen receptor positive ([place]) 03/14/2016   Dx in 09/2014, s/p bilateral mastectomies and ALND, 0/10 LN. 1.4 cm  Grade I invasive lobular, ER and PR +/Her--, Ki 67 <5% Tried anastrozole for one month, but developed suicidal idea  . Parkinson's disease ([place]) 2012  . Psoriatic arthritis ([place])   . PTSD (post-traumatic stress disorder)   . Secondary hyperparathyroidism ([place])   . Tardive dyskinesia     Past Surgical History:  Procedure Laterality Date  . ABDOMINAL HYSTERECTOMY  1987  . CHOLECYSTECTOMY  1979  . DILATION AND CURETTAGE OF UTERUS  1973  . MASTECTOMY Bilateral 09/19/2014  . TONSILLECTOMY  1970  . URETERAL REIMPLANTION Bilateral 1974    There were no vitals filed for this  visit.  Subjective Assessment - 07/06/19 0937    Subjective  Think I'm feeling a little better.    Patient is accompained by:  Family member    Pertinent History  JKD:[id number], bipolar 1 disorder, psoriatic arthritis, Parkinsonism (medication-induced), COVID-19 positive on 04/03/2019    Patient Stated Goals  Pt's goals for therapy are to improve strength and balance.    Currently in Pain?  No/denies    Pain Onset  [name] Adult PT Treatment/Exercise - 07/06/19 0001      Ambulation/Gait   Ambulation/Gait  Yes    Ambulation/Gait Assistance  5: Supervision    Ambulation Distance (Feet)  400 Feet   Additional 500 ft outdoors   Assistive device  Rollator    Gait Pattern  Step-through pattern;Trunk flexed    Ambulation Surface  Level;Indoor;Unlevel;Outdoor;Paved    Pre-Gait Activities  Cues to continue walking at home using rollator, gradually increasing walking distance.    Gait Comments  With outdoor gait, cues for slowed pace, intentional heelstrike with declines, cues for upright posture/foot clearance with inclines.         Neuro Re-education  Reviewed HEP additions given last visit-see below . Lateral Weight  Shift with Arm Raise - 1 x daily - 5 x weekly - 1-2 sets - 10 reps . Staggered Stance Forward Backward Weight Shift with Counter Support - 1 x daily - 5 x weekly - 1-2 sets - 10 reps Pt return demo understanding of HEP.  Balance Exercises - 07/06/19 0943      Balance Exercises: Standing   Standing Eyes Opened  Wide (BOA);Head turns;Solid surface;5 reps;Narrow base of support (BOS)   Head nods-standing at counter   Standing Eyes Closed  Wide (BOA);Solid surface;5 reps;Head turns;Narrow base of support (BOS)   Head nods   Stepping Strategy  Anterior;Posterior;Lateral;10 reps;UE support   Forward/back step and weigthshift x 10 reps at counter   Other Standing Exercises  Pt only requires 1 seated rest break between standing   exercises, additional seated rest break at end of standing, prior to gait.          PT Short Term Goals - 06/30/19 0912      PT SHORT TERM GOAL #1   Title  Pt will perform HEP with family supervision for improved strength, balance, and gait.  TARGET:  06/18/2019    Time  4    Period  Weeks    Status  Achieved      PT SHORT TERM GOAL #2   Title  Pt will improve TUG to less than or equal to 15 seconds for decreased fall risk.    Baseline  17.5 sec 06/30/2019    Time  4    Period  Weeks    Status  Not Met      PT SHORT TERM GOAL #3   Title  Berg Balance test to be assessed, with goal to be written as appropriate.    Baseline  scored 47/56 on 06/10/19 - LTG written as appropriate.    Time  4    Period  Weeks    Status  Achieved      PT SHORT TERM GOAL #4   Title  Pt will improve 5x sit<>stand to less than or equal to 18 seconds for improved functional strength.    Baseline  16.93 sec 06/30/2019    Time  4    Period  Weeks    Status  Achieved      PT SHORT TERM GOAL #5   Title  Pt/husband will verbalize understanding of fall prevention in home environment.    Time  4    Period  Weeks    Status  Achieved        PT Long Term Goals - 06/30/19 0919      PT LONG TERM GOAL #1   Title  Pt will perform HEP with supervision of family for improved balance, strength, gait.  Updates TARGET (due to initial delay in scheduline):   07/30/2019    Time  8    Period  Weeks    Status  On-going      PT LONG TERM GOAL #2   Title  Pt will improve gait velocity to at least 2 ft/sec for improved gait efficiency and safety in the community.    Time  8    Period  Weeks    Status  On-going      PT LONG TERM GOAL #3   Title  Pt will improve BERG to at least 50/56 to demo decr fall risk.    Baseline  47/56 on 06/10/19    Time  8    Period  Weeks  Status  On-going      PT LONG TERM GOAL #4   Title  Pt will improve 5x sit<>stand to less than or equal to 15 seconds for improved functional  strength.    Time  8    Period  Weeks    Status  On-going      PT LONG TERM GOAL #5   Title  Pt will ambulate at least 1000 ft, modified independently with rollator, indoor and outdoor surfaces, for return to community mobility.    Time  8    Period  Weeks    Status  On-going            Plan - 07/06/19 1448    Clinical Impression Statement  Continued to work on standing balance exercises as well as gait, including gait on outdoor surfaces.  Pt needs UE support for most/all of standing counter exercises; did not notice as much fatigue with patient in standing exercises today.  However, she does report fatigue at end of session after outdoor gait.  She will conitnue to benefit from further skilled PT to work towards [place] for improved functional mobility.    Personal Factors and Comorbidities  Comorbidity 3+    Comorbidities  See above    Examination-Activity Limitations  Locomotion Level;Transfers;Stand    Examination-Participation Restrictions  Church;Community Activity    Stability/Clinical Decision Making  Evolving/Moderate complexity    Rehab Potential  Good    PT Frequency  2x / week    PT Duration  8 weeks   plus eval   PT Treatment/Interventions  ADLs/Self Care Home Management;DME Instruction;Neuromuscular re-education;Balance training;Therapeutic activities;Therapeutic exercise;Functional mobility training;Gait training;Patient/family education    PT Next Visit Plan  Continue gait training with rollator (outside for longer distances); work on balance at counter and in corner; review updates to HEP.  Work towards [employer] and Agree with Plan of Care  Patient;Family member/caregiver    Family Member Consulted  Husband       Patient will benefit from skilled therapeutic intervention in order to improve the following deficits and impairments:  Abnormal gait, Decreased balance, Decreased mobility, Decreased strength, Postural dysfunction, Other (comment)(Pain (due to  psoriatic arthritis), not to be specifically addressed due to chronic issue)  Visit Diagnosis: Other abnormalities of gait and mobility  Unsteadiness on feet     Problem List Patient Active Problem List   Diagnosis Date Noted  . Chronic diarrhea 05/05/2019  . Bipolar I disorder, most recent episode depressed ([place])   . Transaminitis   . Essential hypertension   . Seizures ([place])   . Encephalopathy 04/26/2019  . Leukocytosis 04/21/2019  . Thrombocytopenia ([place]) 09/04/2018  . Vitamin D deficiency 09/04/2018  . Psoriatic arthritis ([place]) 09/04/2018  . Tubular adenoma of colon 05/28/2017  . 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 Chronic kidney disease, stage III (moderate) 01/19/2016  . Tardive dyskinesia 10/18/2015  . History of breast cancer left 2016 12/27/2014  . Osteopenia determined by x-ray 10/31/2014  . Rosacea 05/21/2007  . Bipolar affective disorder, mixed ([place]) 08/16/2004  . Acquired hypothyroidism 04/03/1996    Jassiel Flye W. 07/06/2019, 2:51 PM  Frazier Butt., PT   [facility] [address] [place], [name], [address] Phone: [phone]   Fax:  [phone]  Name: Tiffany [name] MRN: [id number] Date of Birth: 01-23-1951

## 2019-07-06 NOTE — Therapy (Signed)
[facility] [address] [place], [name], [address] Phone: [phone]   Fax:  [phone]  Speech Language Pathology Treatment  Patient Details  Name: Tiffany [name] MRN: [id number] Date of Birth: 1950/06/12 No data recorded  Encounter Date: 07/06/2019  End of Session - 07/06/19 1058    Visit Number  5    Number of Visits  17    Date for SLP Re-Evaluation  08/23/19    SLP Start Time  25    SLP Stop Time   1059    SLP Time Calculation (min)  40 min    Activity Tolerance  Patient tolerated treatment well       Past Medical History:  Diagnosis Date  . Bipolar 1 disorder ([place])   . Breast cancer ([place])   . Chronic diarrhea    loose stools twice a day on average for years  . Chronic kidney disease (CKD), stage III (moderate)   . Depression 1987  . 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 1.4 cm  Grade I invasive lobular, ER and PR +/Her--, Ki 67 <5% Tried anastrozole for one month, but developed suicidal idea  . Parkinson's disease ([place]) 2012  . Psoriatic arthritis ([place])   . PTSD (post-traumatic stress disorder)   . Secondary hyperparathyroidism ([place])   . Tardive dyskinesia     Past Surgical History:  Procedure Laterality Date  . ABDOMINAL HYSTERECTOMY  1987  . CHOLECYSTECTOMY  1979  . DILATION AND CURETTAGE OF UTERUS  1973  . MASTECTOMY Bilateral 09/19/2014  . TONSILLECTOMY  1970  . URETERAL REIMPLANTION Bilateral 1974    There were no vitals filed for this visit.  Subjective Assessment - 07/06/19 1029    Subjective  Pt tearful today about wanting to "be normal" and she feels like she is not.    Currently in Pain?  No/denies            ADULT SLP TREATMENT - 07/06/19 1032      General Information   Behavior/Cognition  Alert;Cooperative   tearful initially     Treatment Provided   Treatment provided  Cognitive-Linquistic      Cognitive-Linquistic Treatment   Treatment focused on  Cognition    Skilled Treatment  Pt reports she is keeping more of a schedule and it is better, but she still feels overwhelmed - became tearful. Pt's appointmets with her counselor incr'd to x2/week and she has these in her planner this week and going forward. Pt has made a "to do" list and is doing things on this list routinely but are done each day rather than looking at the next day. Pt's husband is assisting pt PRN with pacing of the to do items.  SLP reinforcing that pt needs to think about the "5K" mentality and not the "marathon" mentality.       Assessment / Recommendations / Plan   Plan  Continue with current plan of care      Progression Toward Goals   Progression toward goals  Progressing toward goals         SLP Short Term Goals - 07/06/19 1053      SLP SHORT TERM GOAL #1   Title  pt will demo WNL organization in simple - mod complex communicative tasks in 3 sessions with modified independence    Baseline  07-06-19    Status  Partially Met      SLP SHORT TERM GOAL #2   Title  pt will demo error awareness in cognitve linguistic tasks in the therapy session with occaisonal min A in 3 sessions    Status  Deferred   pt and husband agree pt is better with error awareness     SLP SHORT TERM GOAL #3   Title  pt will bring a memory compensation device/item to therapy in 5 sessions    Baseline  07-06-19    Status  Partially Met      SLP SHORT TERM GOAL #4   Title  pt will demo selective attention in 5 minutes min-mod noisy environment with rare min A back to task over 3 sessions    Baseline  06-29-19, 07-06-19    Status  Partially Met       SLP Long Term Goals - 07/06/19 1056      SLP LONG TERM GOAL #1   Title  Pt will use a memory compensation device to track meds, appointments, errands, recent events, etc with min nonverbal cues in 8 sessions    Time   5    Period  Weeks   or 17 sessions, for all LTGs   Status  On-going      SLP LONG TERM GOAL #2   Title  pt will demo [facility] organization skills for mod complex cogntive linguistic tasks with extra time allowed in 3 sessions    Time  5    Period  Weeks    Status  On-going      SLP LONG TERM GOAL #3   Title  pt will demonstrate anticipatory awareness by spontaneously double checking a detailed written or auditory task in 5 therapy sessions    Baseline  07-06-19    Time  5    Period  Weeks    Status  On-going      SLP LONG TERM GOAL #4   Title  pt will demo [facility] organization in mod complex communicative tasks in 3 sessions with modified independence    Time  5    Period  Weeks    Status  On-going      SLP LONG TERM GOAL #5   Title  pt will demo simple divided attention in 8 minutes simple-mod complex tasks over three sessions    Time  5    Period  Weeks    Status  On-going       Plan - 07/06/19 1059    Clinical Impression Statement  Pt presents with mild cognitive communication deficits as measured by Cognitive Linguistic Quick Test (CLQT).Pt's exact premorbid deficts other than memory are somewhat unknown. Pt adjusted ST plan to once every other week at this time as she and husband feel she is slowly adjusting to learning her fatigue level, and education from SLP to anticipate fatigue level and/or respond to unexpected fatigue has been helpful in how pt adjusts her schedule for the day. SLP believes pt will benefit from skilled ST targeting cognitive communication deficits to maximize pt's skills to commnicate and interact in the home and community, likely 2-3 more sesions.    Speech Therapy Frequency  --   every other week   Duration  --   8 weeks or 17 total sessions   Treatment/Interventions  Environmental controls;Functional tasks;Compensatory techniques;SLP instruction and feedback;Cueing hierarchy;Cognitive reorganization;Internal/external aids;Patient/family education     Potential to Achieve Goals  Good    Potential Considerations  Previous level of  function    Consulted and Agree with Plan of Care  Patient;Family member/caregiver    Family Member Consulted  husband       Patient will benefit from skilled therapeutic intervention in order to improve the following deficits and impairments:   Cognitive communication deficit    Problem List Patient Active Problem List   Diagnosis Date Noted  . Chronic diarrhea 05/05/2019  . Bipolar I disorder, most recent episode depressed ([place])   . Transaminitis   . Essential hypertension   . Seizures ([place])   . Encephalopathy 04/26/2019  . Leukocytosis 04/21/2019  . Thrombocytopenia ([place]) 09/04/2018  . Vitamin D deficiency 09/04/2018  . Psoriatic arthritis ([place]) 09/04/2018  . Tubular adenoma of colon 05/28/2017  . 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 Malignant neoplasm of overlapping sites of left breast in female, estrogen receptor positive ([place]) 03/14/2016  . Chronic kidney disease, stage III (moderate) 01/19/2016  . Tardive dyskinesia 10/18/2015  . History of breast cancer left 2016 12/27/2014  . Osteopenia determined by x-ray 10/31/2014  . Rosacea 05/21/2007  . Bipolar affective disorder, mixed ([place]) 08/16/2004  . Acquired hypothyroidism 04/03/1996    [facility] ,[place], [place]  07/06/2019, 11:39 AM  [facility] [address] [place], [name], [address] Phone: [phone]   Fax:  [phone]   Name: Tiffany [name] MRN: [id number] Date of Birth: February 15, 1951

## 2019-07-06 NOTE — Therapy (Signed)
[facility] [address] [place], [name], [address] Phone: [phone]   Fax:  [phone]  Occupational Therapy Treatment  Patient Details  Name: Tiffany [name] MRN: [id number] Date of Birth: 09/01/1950 Referring Provider (OT): Dr. Ranell Patrick   Encounter Date: 07/06/2019  OT End of Session - 07/06/19 1206    Visit Number  7    Number of Visits  17    Date for OT Re-Evaluation  07/17/19    Authorization Type  Medicare    Authorization - Visit Number  7    Authorization - Number of Visits  10    Progress Note Due on Visit  10    OT Start Time  1104    OT Stop Time  1145    OT Time Calculation (min)  41 min    Activity Tolerance  Patient tolerated treatment well    Behavior During Therapy  [facility] for tasks assessed/performed       Past Medical History:  Diagnosis Date  . Bipolar 1 disorder ([place])   . Breast cancer ([place])   . Chronic diarrhea    loose stools twice a day on average for years  . Chronic kidney disease (CKD), stage III (moderate)   . Depression 1987  . Malignant neoplasm of overlapping sites of left breast in female, estrogen receptor positive ([place]) 03/14/2016   Dx in 09/2014, s/p bilateral mastectomies and ALND, 0/10 LN. 1.4 cm  Grade I invasive lobular, ER and PR +/Her--, Ki 67 <5% Tried anastrozole for one month, but developed suicidal idea  . Parkinson's disease ([place]) 2012  . Psoriatic arthritis ([place])   . PTSD (post-traumatic stress disorder)   . Secondary hyperparathyroidism ([place])   . Tardive dyskinesia     Past Surgical History:  Procedure Laterality Date  . ABDOMINAL HYSTERECTOMY  1987  . CHOLECYSTECTOMY  1979  . DILATION AND CURETTAGE OF UTERUS  1973  . MASTECTOMY Bilateral 09/19/2014  . TONSILLECTOMY  1970  . URETERAL REIMPLANTION Bilateral 1974    There were no vitals filed for this visit.  Subjective Assessment - 07/06/19 1205    Subjective   Pt reports using her notebook at home for  scheduling.    Pertinent History  69 y.o. female with medical history significant of hypothyroidism, bipolar 1 disorder, psoriatic arthritis, Parkinsonism, COVID-19 positive on 1/23.  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  Pt received therapies at [facility] and she was d/c home 05/05/19.    Patient Stated Goals  increase indepdence and balance    Currently in Pain?  No/denies             Treatment: Dynamic functional reaching with trunk rotation with bilateral UE's, min v.c several rest breaks required. Placing grooved pegs into pegboard with RUE then removing with in hand manipulation for improved fine motor coordination, min difficulty. Arm bike x 5 mins level 1 for conditioning, pt did not require rest breaks today.              OT Education - 07/06/19 1215    Education Details  PWR! moves in seated 5-10 reps each, min v.c and demonstration.    Person(s) Educated  Patient;Spouse    Methods  Explanation;Demonstration;Verbal cues;Handout    Comprehension  Verbalized understanding;Returned demonstration;Verbal cues required       OT Short Term Goals - 05/18/19 1443  OT SHORT TERM GOAL #1   Title  I with HEP.    Time  4    Period  Weeks    Status  New    Target Date  06/17/19      OT SHORT TERM GOAL #2   Title  Pt will verbalize understanding of adpated strategies for ADLs/ I ADLs in order to maximize safety and I.    Time  4    Period  Weeks    Status  New      OT SHORT TERM GOAL #3   Title  Pt will demonstrate ability to write a paragraph with 100% legibility and no significant decrease in letter size.    Baseline  Pt reports handwriting becomes smalller when she writes a paragraph    Time  4    Period  Weeks    Status  New      OT SHORT TERM GOAL #4   Title  Pt will demonstrate improved RUE functional reach/ use for ADLS as  evidenced by increasing box/ blocks score by 3 blocks.    Time  4    Period  Weeks    Status  New        OT Long Term Goals - 07/02/19 1009      OT LONG TERM GOAL #1   Title  Pt will demonstrate ability to retieve a lightweight object at 125 shoulder flexion with -5 elbow extension.    Baseline  120, -10    Time  8    Period  Weeks    Status  On-going      OT LONG TERM GOAL #2   Title  Pt will demonstrate ability to retrieve a lightweight object at 120 shoulder flexion with -5 elbow extension.    Baseline  115, -10    Time  8    Period  Weeks    Status  On-going      OT LONG TERM GOAL #3   Title  Pt will verbalize understanding of ways to keep thinking skills sharp and memory compensations prn.    Time  8    Period  Weeks    Status  On-going      OT LONG TERM GOAL #4   Title  Pt will perform basic cooking and home management with supervision.    Time  8    Period  Weeks    Status  On-going            Plan - 07/06/19 1210    Clinical Impression Statement  Pt is progressing towards goals. Pt demonstrates improved strength and endurance.    OT Occupational Profile and History  Detailed Assessment- Review of Records and additional review of physical, cognitive, psychosocial history related to current functional performance    Occupational performance deficits (Please refer to evaluation for details):  ADL's;IADL's;Leisure;Social Participation    Body Structure / Function / Physical Skills  ADL;Balance;Mobility;Strength;Flexibility;FMC;Coordination;GMC;Decreased knowledge of precautions;ROM;Decreased knowledge of use of DME;IADL;Dexterity;Sensation;UE functional use    Youth worker;Understand;Thought    Rehab Potential  Good    Clinical Decision Making  Limited treatment options, no task modification necessary    Comorbidities Affecting Occupational Performance:  May have comorbidities impacting occupational performance    Modification or Assistance to  Complete Evaluation   No modification of tasks or assist necessary to complete eval    OT Frequency  2x / week   plus eval, may d/c after 6 weeks dependent  upon progress   OT Duration  8 weeks    OT Treatment/Interventions  Self-care/ADL training;Therapeutic exercise;Balance training;Functional Mobility Training;Aquatic Therapy;Neuromuscular education;Manual Therapy;Therapeutic activities;Energy conservation;Paraffin;DME and/or AE instruction;Cognitive remediation/compensation;Gait Training;Fluidtherapy;Moist Heat;Passive range of motion;Patient/family education    Plan  review PWR! seated, functional activity with big movements    Consulted and Agree with Plan of Care  Patient;Family member/caregiver    Family Member Consulted  husband       Patient will benefit from skilled therapeutic intervention in order to improve the following deficits and impairments:   Body Structure / Function / Physical Skills: ADL, Balance, Mobility, Strength, Flexibility, FMC, Coordination, GMC, Decreased knowledge of precautions, ROM, Decreased knowledge of use of DME, IADL, Dexterity, Sensation, UE functional use Cognitive Skills: Memory, Understand, Thought     Visit Diagnosis: Muscle weakness (generalized)  Stiffness of right shoulder, not elsewhere classified  Frontal lobe and executive function deficit  Stiffness of left shoulder, not elsewhere classified  Other lack of coordination  Other symptoms and signs involving the nervous system    Problem List Patient Active Problem List   Diagnosis Date Noted  . Chronic diarrhea 05/05/2019  . Bipolar I disorder, most recent episode depressed ([place])   . Transaminitis   . Essential hypertension   . Seizures ([place])   . Encephalopathy 04/26/2019  . Leukocytosis 04/21/2019  . Thrombocytopenia ([place]) 09/04/2018  . Vitamin D deficiency 09/04/2018  . Psoriatic arthritis ([place]) 09/04/2018  . Tubular adenoma of colon 05/28/2017  . 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 Malignant neoplasm of overlapping sites of left breast in female, estrogen receptor positive ([place]) 03/14/2016  . Chronic kidney disease, stage III (moderate) 01/19/2016  . Tardive dyskinesia 10/18/2015  . History of breast cancer left 2016 12/27/2014  . Osteopenia determined by x-ray 10/31/2014  . Rosacea 05/21/2007  . Bipolar affective disorder, mixed ([place]) 08/16/2004  . Acquired hypothyroidism 04/03/1996    Kateria Cutrona 07/06/2019, 12:15 PM  [facility] [address] [place], [name], [address] Phone: [phone]   Fax:  [phone]  Name: Amayiah Gosnell MRN: [id number] Date of Birth: June 16, 1950

## 2019-07-07 ENCOUNTER — Ambulatory Visit (INDEPENDENT_AMBULATORY_CARE_PROVIDER_SITE_OTHER): Payer: Medicare Other | Admitting: Adult Health

## 2019-07-07 ENCOUNTER — Encounter: Payer: Self-pay | Admitting: Adult Health

## 2019-07-07 DIAGNOSIS — F4310 Post-traumatic stress disorder, unspecified: Secondary | ICD-10-CM | POA: Diagnosis not present

## 2019-07-07 DIAGNOSIS — F319 Bipolar disorder, unspecified: Secondary | ICD-10-CM

## 2019-07-07 DIAGNOSIS — G4700 Insomnia, unspecified: Secondary | ICD-10-CM | POA: Diagnosis not present

## 2019-07-07 DIAGNOSIS — F411 Generalized anxiety disorder: Secondary | ICD-10-CM

## 2019-07-07 MED ORDER — LORAZEPAM 1 MG PO TABS: 1.0000 mg | ORAL_TABLET | Freq: Two times a day (BID) | ORAL | 2 refills | Status: DC

## 2019-07-07 MED ORDER — LAMOTRIGINE 100 MG PO TABS: 100.0000 mg | ORAL_TABLET | Freq: Every day | ORAL | 1 refills | Status: DC

## 2019-07-07 NOTE — Progress Notes (Signed)
Tiffany [name] [id number] 12-Oct-1950 69 y.o.  Subjective:   Patient ID:  Tiffany [name] is a 69 y.o. (DOB Aug 03, 1950) female.  Chief Complaint: No chief complaint on file.   HPI Tiffany [name] presents to the office today for follow-up of  PTSD, insomnia,GAD, BPD 1.  Describes mood today as "ok". Pleasant. Mood symptoms - not feeling depressed. Reports increased anxiety and irritability at times. Stating "I am doing better overall". Continuing outpatient rehab - encouraging and supportive. Stating they helped me get a "plan" together so "I'm not so overwhelmed. Having "ups and downs". Last week was the "worst" week she's had. Talked to [facility] and wants to see her twice a week. Trying to manage "unwanted" thoughts. Decreased Ativan use. Trying to recognize when she feels "agitated" and do different things to manage symptoms. Improved interest and motivation. Taking medications as prescribed.  Energy levels "improving" - has P/T twice a week for 2 hours. Active, exercising regularly. Enjoys some usual interests and activities. Married. Lives with husband of 19 years. Talking to family.  Appetite adequate - taste not at 100%. Weight loss - 35 pounds last visit. Sleeps better at night - using Ativan. Averages 6 to 8 hours. Focus and concentration improved. Completing tasks. Managing some aspects of household.  Denies SI or HI. Denies AH or VH.   Previous medications: Celexa, Zyprexa, Tegretol, Depakote, Serzone, Topamax, Seroquel, Effexor, Lexapro, Desipramine, Neurontin, Abilify, Geodon, Propanolol, Cymbalta, Cogentin, Trihexyphenadyl, Simet, Provigil, Selegiline, Requip, Amantadine, Prozac, Mirapex, Azilect, Metoclopramide, Baclofen, Artane, Namenda, Latuda.   GAD-7     Office Visit from 05/14/2019 in [place]  Total GAD-7 Score  21    Mini-Mental     Office Visit from 06/07/2019 in [place]  Total Score (max 30 points )  29    PHQ2-9     Office Visit from  05/14/2019 in [place]  PHQ-2 Total Score  1  PHQ-9 Total Score  8       Review of Systems:  Review of Systems  Musculoskeletal: Negative for gait problem.  Neurological: Negative for tremors.  Psychiatric/Behavioral:       Please refer to HPI    Medications: I have reviewed the patient's current medications.  Current Outpatient Medications  Medication Sig Dispense Refill  . acetaminophen (TYLENOL) 500 MG tablet Take 500 mg by mouth daily as needed for headache (pain).     Marland Kitchen amLODipine (NORVASC) 2.5 MG tablet Take 1 tablet (2.5 mg total) by mouth daily. 90 tablet 3  . calcitRIOL (ROCALTROL) 0.25 MCG capsule Take 0.25 mcg by mouth every Monday, Wednesday, and Friday.     . Certolizumab Pegol (CIMZIA ) Inject 200 mg into the skin every 30 (thirty) days.    . Cholecalciferol 50 MCG (2000 UT) TABS Take 1 tablet (2,000 Units total) by mouth See admin instructions. Take 1 tablet by mouth daily 30 tablet 0  . lamoTRIgine (LAMICTAL) 100 MG tablet Take 1 tablet (100 mg total) by mouth at bedtime. 30 tablet 0  . levETIRAcetam (KEPPRA) 250 MG tablet TAKE 1 TABLET(250 MG) BY MOUTH TWICE DAILY 180 tablet 3  . levothyroxine (SYNTHROID) 112 MCG tablet Take 1 tablet (112 mcg total) by mouth daily at 6 (six) AM. 90 tablet 3  . loperamide (IMODIUM) 2 MG capsule Take 1 capsule (2 mg total) by mouth as needed for diarrhea or loose stools. 30 capsule 0  . LORazepam (ATIVAN) 1 MG tablet Take 1 tablet (1 mg total) by mouth  2 (two) times daily. 60 tablet 2  . sodium bicarbonate 650 MG tablet Take 1 tablet (650 mg total) by mouth 2 (two) times daily. 60 tablet 0   No current facility-administered medications for this visit.    Medication Side Effects: None  Allergies:  Allergies  Allergen Reactions  . Aripiprazole Other (See Comments)    Parkinsonism     . Lactose Intolerance (Gi) Diarrhea  . Methotrexate Other (See Comments)    Hair loss, severe stomatitis    .  Cefdinir Diarrhea    Other reaction(s): Diarrhea Yeast infection and fever; negative c diff  . Etanercept Other (See Comments)    Headaches    . Exemestane Other (See Comments)    Suicidal thoughts with medication    . Fluoxetine Other (See Comments)    Parkinsonism  . Methylprednisolone Sodium Succ Other (See Comments)    Agitated mania  . Epinephrine Palpitations    tachycardia   . Nitrofurantoin Nausea And Vomiting and Rash         Past Medical History:  Diagnosis Date  . Bipolar 1 disorder ([place])   . Breast cancer ([place])   . Chronic diarrhea    loose stools twice a day on average for years  . Chronic kidney disease (CKD), stage III (moderate)   . Depression 1987  . Malignant neoplasm of overlapping sites of left breast in female, estrogen receptor positive ([place]) 03/14/2016   Dx in 09/2014, s/p bilateral mastectomies and ALND, 0/10 LN. 1.4 cm  Grade I invasive lobular, ER and PR +/Her--, Ki 67 <5% Tried anastrozole for one month, but developed suicidal idea  . Parkinson's disease ([place]) 2012  . Psoriatic arthritis ([place])   . PTSD (post-traumatic stress disorder)   . Secondary hyperparathyroidism ([place])   . Tardive dyskinesia     Family History  Problem Relation Age of Onset  . Cancer Mother   . Cancer Sister   . Stroke Maternal Grandfather   . Diabetes Paternal Grandfather   . Cancer Sister     Social History   Socioeconomic History  . Marital status: Married    Spouse name: Not on file  . Number of children: Not on file  . Years of education: Not on file  . Highest education level: Not on file  Occupational History  . Not on file  Tobacco Use  . Smoking status: Never Smoker  . Smokeless tobacco: Never Used  Substance and Sexual Activity  . Alcohol use: Never  . Drug use: Never  . Sexual activity: Not Currently  Other Topics Concern  . Not on file  Social History Narrative   Moved to area from [place] 08/2018   Lives one story home   Right handed.    Social Determinants of Health   Financial Resource Strain:   . Difficulty of Paying Living Expenses:   Food Insecurity:   . Worried About Charity fundraiser in the Last Year:   . Arboriculturist in the Last Year:   Transportation Needs:   . Film/video editor (Medical):   Marland Kitchen Lack of Transportation (Non-Medical):   Physical Activity:   . Days of Exercise per Week:   . Minutes of Exercise per Session:   Stress:   . Feeling of Stress :   Social Connections:   . Frequency of Communication with Friends and Family:   . Frequency of Social Gatherings with Friends and Family:   . Attends Religious Services:   . Active  Member of Clubs or Organizations:   . Attends Archivist Meetings:   Marland Kitchen Marital Status:   Intimate Partner Violence:   . Fear of Current or Ex-Partner:   . Emotionally Abused:   Marland Kitchen Physically Abused:   . Sexually Abused:     Past Medical History, Surgical history, Social history, and Family history were reviewed and updated as appropriate.   Please see review of systems for further details on the patient's review from today.   Objective:   Physical Exam:  There were no vitals taken for this visit.  Physical Exam Constitutional:      General: She is not in acute distress. Musculoskeletal:        General: No deformity.  Neurological:     Mental Status: She is alert and oriented to person, place, and time.     Coordination: Coordination normal.  Psychiatric:        Attention and Perception: Attention and perception normal. She does not perceive auditory or visual hallucinations.        Mood and Affect: Mood normal. Mood is not anxious or depressed. Affect is not labile, blunt, angry or inappropriate.        Speech: Speech normal.        Behavior: Behavior normal.        Thought Content: Thought content normal. Thought content is not paranoid or delusional. Thought content does not include homicidal or suicidal ideation. Thought content does not  include homicidal or suicidal plan.        Cognition and Memory: Cognition and memory normal.        Judgment: Judgment normal.     Comments: Insight intact     Lab Review:     Component Value Date/Time   NA 138 06/08/2019 0000   K 4.6 06/08/2019 0000   CL 105 06/08/2019 0000   CO2 26 (A) 06/08/2019 0000   GLUCOSE 98 05/03/2019 0553   BUN 29 (A) 06/08/2019 0000   CREATININE 2.0 (A) 06/08/2019 0000   CREATININE 1.86 (H) 05/03/2019 0553   CALCIUM 9.7 06/08/2019 0000   PROT 5.9 (L) 04/30/2019 0519   ALBUMIN 4.0 06/08/2019 0000   AST 34 04/30/2019 0519   ALT 57 (H) 04/30/2019 0519   ALKPHOS 93 04/30/2019 0519   BILITOT 0.4 04/30/2019 0519   GFRNONAA 24 06/08/2019 0000   GFRAA 28 06/08/2019 0000       Component Value Date/Time   WBC 8.3 05/03/2019 0553   RBC 3.91 05/03/2019 0553   HGB 11.7 (L) 05/03/2019 0553   HCT 37.8 05/03/2019 0553   PLT 177 05/03/2019 0553   MCV 96.7 05/03/2019 0553   MCH 29.9 05/03/2019 0553   MCHC 31.0 05/03/2019 0553   RDW 14.3 05/03/2019 0553   LYMPHSABS 2.5 04/27/2019 0544   MONOABS 0.5 04/27/2019 0544   EOSABS 0.2 04/27/2019 0544   BASOSABS 0.0 04/27/2019 0544    Lithium Lvl  Date Value Ref Range Status  04/21/2019 0.79 0.60 - 1.20 mmol/L Final    Comment:    Performed at [facility], [place] [address] [place], [place] [address]     No results found for: PHENYTOIN, PHENOBARB, VALPROATE, CBMZ   .res Assessment: Plan:     Plan:  Lamictal 100mg  at hs Lorazepam 1mg  BID for anxiety  Also taking Keppra 250mg  BID for seizures  Therapist - [name] - seeing every 2 weeks  RTC 4 weeks  Discussed potential benefits, risk, and side effects of  benzodiazepines to include potential risk of tolerance and dependence, as well as possible drowsiness.  Advised patient not to drive if experiencing drowsiness and to take lowest possible effective dose to minimize risk of dependence and tolerance.  Counseled patient regarding potential  benefits, risks, and side effects of Lamictal to include potential risk of Stevens-Johnson syndrome. Advised patient to stop taking Lamictal and contact office immediately if rash develops and to seek urgent medical attention if rash is severe and/or spreading quickly.   There are no diagnoses linked to this encounter.   Please see After Visit Summary for patient specific instructions.  Future Appointments  Date Time Provider [place]  07/09/2019  8:45 AM Hulda Marin, OT [facility]  07/09/2019  9:30 AM Frazier Butt, PT [facility]  07/09/2019 11:00 AM Cottle, Lucious Groves, LCSW [facility] None  07/12/2019  2:00 PM Cottle, Bambi G, LCSW [facility] None  07/13/2019  9:30 AM Frazier Butt, PT [facility]  07/13/2019 10:15 AM Rine, Selmer Dominion, OT [facility]  07/16/2019  9:30 AM Arliss Journey, PT [facility]  07/16/2019 10:15 AM Rine, Selmer Dominion, OT [facility]  07/20/2019 10:15 AM Lovvorn, Hewitt Shorts, CCC-SLP [facility]  07/20/2019 11:00 AM Arliss Journey, PT [facility]  07/21/2019  8:45 AM Delton Prairie, OT [facility]  07/21/2019  1:00 PM Cottle, Lucious Groves, LCSW [facility] None  07/23/2019 12:30 PM Frazier Butt, PT [facility]  07/27/2019  9:30 AM Rine, Selmer Dominion, OT [facility]  07/27/2019 11:00 AM Arliss Journey, PT [facility]  07/28/2019  1:00 PM Cottle, Bambi G, LCSW [facility] None  07/29/2019  9:30 AM Rine, Selmer Dominion, OT [facility]  07/29/2019 10:15 AM Frazier Butt, PT [facility]  08/04/2019  1:15 PM Valentino Saxon [place], CCC-SLP [facility]  09/03/2019 10:30 AM Warren Danes, PA-C [facility] CD[facility]  09/24/2019  1:00 PM Leamon Arnt, MD [facility]  12/14/2019 11:30 AM Cameron Sprang, MD [facility] None    No orders of the defined types were placed in this encounter.   -------------------------------

## 2019-07-09 ENCOUNTER — Ambulatory Visit: Payer: Medicare Other | Admitting: Physical Therapy

## 2019-07-09 ENCOUNTER — Ambulatory Visit (INDEPENDENT_AMBULATORY_CARE_PROVIDER_SITE_OTHER): Payer: Medicare Other | Admitting: Psychology

## 2019-07-09 ENCOUNTER — Encounter: Payer: Medicare Other | Admitting: Occupational Therapy

## 2019-07-09 ENCOUNTER — Encounter: Payer: Self-pay | Admitting: Occupational Therapy

## 2019-07-09 ENCOUNTER — Ambulatory Visit: Payer: Medicare Other | Admitting: Occupational Therapy

## 2019-07-09 ENCOUNTER — Other Ambulatory Visit: Payer: Self-pay

## 2019-07-09 ENCOUNTER — Encounter: Payer: Self-pay | Admitting: Physical Therapy

## 2019-07-09 DIAGNOSIS — R278 Other lack of coordination: Secondary | ICD-10-CM

## 2019-07-09 DIAGNOSIS — F314 Bipolar disorder, current episode depressed, severe, without psychotic features: Secondary | ICD-10-CM

## 2019-07-09 DIAGNOSIS — M6281 Muscle weakness (generalized): Secondary | ICD-10-CM

## 2019-07-09 DIAGNOSIS — R2681 Unsteadiness on feet: Secondary | ICD-10-CM

## 2019-07-09 DIAGNOSIS — R2689 Other abnormalities of gait and mobility: Secondary | ICD-10-CM

## 2019-07-09 DIAGNOSIS — M25612 Stiffness of left shoulder, not elsewhere classified: Secondary | ICD-10-CM

## 2019-07-09 DIAGNOSIS — R41844 Frontal lobe and executive function deficit: Secondary | ICD-10-CM

## 2019-07-09 DIAGNOSIS — M25611 Stiffness of right shoulder, not elsewhere classified: Secondary | ICD-10-CM

## 2019-07-09 DIAGNOSIS — R29818 Other symptoms and signs involving the nervous system: Secondary | ICD-10-CM

## 2019-07-09 NOTE — Therapy (Signed)
[facility] [address] [place], [name], [address] Phone: [phone]   Fax:  [phone]  Occupational Therapy Treatment  Patient Details  Name: Tiffany [name] MRN: [id number] Date of Birth: 05-23-1950 Referring Provider (OT): Dr. Ranell Patrick   Encounter Date: 07/09/2019  OT End of Session - 07/09/19 0853    Visit Number  8    Number of Visits  17    Date for OT Re-Evaluation  07/17/19    Authorization Type  Medicare    Authorization Time Period  auth period 90 days though 08/15/19 week 4/8    Authorization - Visit Number  8    Authorization - Number of Visits  10    Progress Note Due on Visit  10    OT Start Time  0850    OT Stop Time  0930    OT Time Calculation (min)  40 min    Activity Tolerance  Patient tolerated treatment well    Behavior During Therapy  [facility] for tasks assessed/performed       Past Medical History:  Diagnosis Date  . Bipolar 1 disorder ([place])   . Breast cancer ([place])   . Chronic diarrhea    loose stools twice a day on average for years  . Chronic kidney disease (CKD), stage III (moderate)   . Depression 1987  . Malignant neoplasm of overlapping sites of left breast in female, estrogen receptor positive ([place]) 03/14/2016   Dx in 09/2014, s/p bilateral mastectomies and ALND, 0/10 LN. 1.4 cm  Grade I invasive lobular, ER and PR +/Her--, Ki 67 <5% Tried anastrozole for one month, but developed suicidal idea  . Parkinson's disease ([place]) 2012  . Psoriatic arthritis ([place])   . PTSD (post-traumatic stress disorder)   . Secondary hyperparathyroidism ([place])   . Tardive dyskinesia     Past Surgical History:  Procedure Laterality Date  . ABDOMINAL HYSTERECTOMY  1987  . CHOLECYSTECTOMY  1979  . DILATION AND CURETTAGE OF UTERUS  1973  . MASTECTOMY Bilateral 09/19/2014  . TONSILLECTOMY  1970  . URETERAL REIMPLANTION Bilateral 1974    There were no vitals filed for this visit.  Subjective Assessment -  07/09/19 0850    Pertinent History  69 y.o. female with medical history significant of hypothyroidism, bipolar 1 disorder, psoriatic arthritis, Parkinsonism, COVID-19 positive on 1/23.  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  Pt received therapies at [facility] and she was d/c home 05/05/19.    Patient Stated Goals  increase indepdence and balance    Currently in Pain?  No/denies         [facility] OT Assessment - 07/09/19 0001      Coordination   Box and Blocks  RUE 58 blocks LUE 51              Treatment: Supine closed chain chest press and shoulder flexion with ball, 10 reps each, min v.c PWR! Basic 4 in seated 10 reps each, min v.c for larger amplitude movements.  Pt wrote a paragraph with good legibility and letter size . Copying small peg design with RUE for coordination task with cognitive component, pt demonstrates improved coordination and focus. Pt achieved all long term goals.           OT Short Term Goals - 07/09/19 0904      OT SHORT TERM GOAL #1  Title  I with HEP.    Time  4    Period  Weeks    Status  Achieved    Target Date  06/17/19      OT SHORT TERM GOAL #2   Title  Pt will verbalize understanding of adpated strategies for ADLs/ I ADLs in order to maximize safety and I.    Time  4    Period  Weeks    Status  Achieved      OT SHORT TERM GOAL #3   Title  Pt will demonstrate ability to write a paragraph with 100% legibility and no significant decrease in letter size.    Baseline  Pt reports handwriting becomes smalller when she writes a paragraph    Time  4    Period  Weeks    Status  On-going      OT SHORT TERM GOAL #4   Title  Pt will demonstrate improved RUE functional reach/ use for ADLS as evidenced by increasing box/ blocks score by 3 blocks.    Time  4    Period  Weeks    Status  Achieved   met 58 blocks        OT Long Term Goals - 07/09/19 0915      OT LONG TERM GOAL #1   Title  Pt will demonstrate ability to retieve a lightweight object at 125 shoulder flexion with -5 elbow extension.- due 07/17/19    Baseline  120, -10    Time  8    Period  Weeks    Status  On-going      OT LONG TERM GOAL #2   Title  Pt will demonstrate ability to retrieve a lightweight object at 120 shoulder flexion with -5 elbow extension.    Baseline  115, -10    Time  8    Period  Weeks    Status  On-going      OT LONG TERM GOAL #3   Title  Pt will verbalize understanding of ways to keep thinking skills sharp and memory compensations prn.    Time  8    Period  Weeks    Status  On-going      OT LONG TERM GOAL #4   Title  Pt will perform basic cooking and home management with supervision.    Time  8    Period  Weeks    Status  On-going            Plan - 07/09/19 5176    Clinical Impression Statement  Pt is progressing towards goals. Pt demonstrates improving shoulder ROM.    OT Occupational Profile and History  Detailed Assessment- Review of Records and additional review of physical, cognitive, psychosocial history related to current functional performance    Occupational performance deficits (Please refer to evaluation for details):  ADL's;IADL's;Leisure;Social Participation    Body Structure / Function / Physical Skills  ADL;Balance;Mobility;Strength;Flexibility;FMC;Coordination;GMC;Decreased knowledge of precautions;ROM;Decreased knowledge of use of DME;IADL;Dexterity;Sensation;UE functional use    Youth worker;Understand;Thought    Rehab Potential  Good    Clinical Decision Making  Limited treatment options, no task modification necessary    Comorbidities Affecting Occupational Performance:  May have comorbidities impacting occupational performance    Modification or Assistance to Complete Evaluation   No modification of tasks or assist necessary to complete eval    OT Frequency  2x /  week   plus eval, may d/c after 6 weeks dependent upon progress  OT Duration  8 weeks    OT Treatment/Interventions  Self-care/ADL training;Therapeutic exercise;Balance training;Functional Mobility Training;Aquatic Therapy;Neuromuscular education;Manual Therapy;Therapeutic activities;Energy conservation;Paraffin;DME and/or AE instruction;Cognitive remediation/compensation;Gait Training;Fluidtherapy;Moist Heat;Passive range of motion;Patient/family education    Plan  review PWR! seated, functional activity with big movements    Consulted and Agree with Plan of Care  Patient;Family member/caregiver    Family Member Consulted  husband       Patient will benefit from skilled therapeutic intervention in order to improve the following deficits and impairments:   Body Structure / Function / Physical Skills: ADL, Balance, Mobility, Strength, Flexibility, FMC, Coordination, GMC, Decreased knowledge of precautions, ROM, Decreased knowledge of use of DME, IADL, Dexterity, Sensation, UE functional use Cognitive Skills: Memory, Understand, Thought     Visit Diagnosis: Muscle weakness (generalized)  Stiffness of right shoulder, not elsewhere classified  Frontal lobe and executive function deficit  Stiffness of left shoulder, not elsewhere classified  Other lack of coordination  Other symptoms and signs involving the nervous system    Problem List Patient Active Problem List   Diagnosis Date Noted  . Chronic diarrhea 05/05/2019  . Bipolar I disorder, most recent episode depressed ([place])   . Transaminitis   . Essential hypertension   . Seizures ([place])   . Encephalopathy 04/26/2019  . Leukocytosis 04/21/2019  . Thrombocytopenia ([place]) 09/04/2018  . Vitamin D deficiency 09/04/2018  . Psoriatic arthritis ([place]) 09/04/2018  . Tubular adenoma of colon 05/28/2017  . 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 Malignant neoplasm of overlapping sites of left  breast in female, estrogen receptor positive ([place]) 03/14/2016  . Chronic kidney disease, stage III (moderate) 01/19/2016  . Tardive dyskinesia 10/18/2015  . History of breast cancer left 2016 12/27/2014  . Osteopenia determined by x-ray 10/31/2014  . Rosacea 05/21/2007  . Bipolar affective disorder, mixed ([place]) 08/16/2004  . Acquired hypothyroidism 04/03/1996    Tiffany [name] 07/09/2019, 9:39 AM  [facility] [address] [place], [name], [address] Phone: [phone]   Fax:  [phone]  Name: Tiffany [name] MRN: [id number] Date of Birth: 1951-02-26

## 2019-07-09 NOTE — Therapy (Signed)
[facility] [address] [place], [name], [address] Phone: [phone]   Fax:  [phone]  Physical Therapy Treatment  Patient Details  Name: Tiffany [name] MRN: [id number] Date of Birth: 01-22-1951 Referring Provider (PT): Marlowe Shores, [place]   Encounter Date: 07/09/2019  PT End of Session - 07/09/19 0933    Visit Number  8    Number of Visits  17    Date for PT Re-Evaluation  24/26/83   90 day recert for 8 wk POC   Authorization Type  Medicare/[employer]- will need 10th visit progress note    Progress Note Due on Visit  10    PT Start Time  0932    PT Stop Time  1012    PT Time Calculation (min)  40 min    Equipment Utilized During Treatment  Gait belt    Activity Tolerance  Patient tolerated treatment well   several seated rest breaks   Behavior During Therapy  [facility] for tasks assessed/performed   Tearful at beginning of session      Past Medical History:  Diagnosis Date  . Bipolar 1 disorder ([place])   . Breast cancer ([place])   . Chronic diarrhea    loose stools twice a day on average for years  . Chronic kidney disease (CKD), stage III (moderate)   . Depression 1987  . Malignant neoplasm of overlapping sites of left breast in female, estrogen receptor positive ([place]) 03/14/2016   Dx in 09/2014, s/p bilateral mastectomies and ALND, 0/10 LN. 1.4 cm  Grade I invasive lobular, ER and PR +/Her--, Ki 67 <5% Tried anastrozole for one month, but developed suicidal idea  . Parkinson's disease ([place]) 2012  . Psoriatic arthritis ([place])   . PTSD (post-traumatic stress disorder)   . Secondary hyperparathyroidism ([place])   . Tardive dyskinesia     Past Surgical History:  Procedure Laterality Date  . ABDOMINAL HYSTERECTOMY  1987  . CHOLECYSTECTOMY  1979  . DILATION AND CURETTAGE OF UTERUS  1973  . MASTECTOMY Bilateral 09/19/2014  . TONSILLECTOMY  1970  . URETERAL REIMPLANTION Bilateral 1974    There were no vitals filed for this  visit.  Subjective Assessment - 07/09/19 0934    Subjective  I've discovered since I've been here, that moving helps alot.    Patient is accompained by:  Family member    Pertinent History  MHD:[id number], bipolar 1 disorder, psoriatic arthritis, Parkinsonism (medication-induced), COVID-19 positive on 04/03/2019    Patient Stated Goals  Pt's goals for therapy are to improve strength and balance.    Currently in Pain?  No/denies    Pain Onset  [name] Adult PT Treatment/Exercise - 07/09/19 1014      Ambulation/Gait   Ambulation/Gait  Yes    Ambulation/Gait Assistance  5: Supervision    Ambulation Distance (Feet)  780 Feet    Assistive device  Rollator    Gait Pattern  Step-through pattern;Trunk flexed    Ambulation Surface  Unlevel;Outdoor;Paved    Gait Comments  Gait with conversation tasks; pt with mild SOB at end of gait.          Balance Exercises - 07/09/19 0937      Balance Exercises: Standing   Standing Eyes Opened  Wide (BOA);Head turns;5 reps;Narrow base of support (BOS);Foam/compliant surface   Head nods   Standing Eyes Closed  Wide (BOA);5 reps;Head turns;Narrow base of support (BOS);Foam/compliant surface   Head nods   Stepping Strategy  Anterior;Posterior;Lateral;10 reps;UE support   Alternating legs   Tandem Gait  Forward;Retro;Upper extremity support;Foam/compliant surface;3 reps    Retro Gait  Foam/compliant surface;Upper extremity support;4 reps   Forward/back at [employer] support;Upper extremity support;3 reps    Other Standing Exercises  Wide BOS lateral weightshifting through hips x 10, then with UE reach x 10 reps .  Stagger stance weightshifting up and back x 10 reps each leg position.      Other Standing Exercises Comments  Forward/back step and weightshift at counter with 1 UE support, x 10 reps, initial cues for increased step length          PT Short Term Goals -  06/30/19 0912      PT SHORT TERM GOAL #1   Title  Pt will perform HEP with family supervision for improved strength, balance, and gait.  TARGET:  06/18/2019    Time  4    Period  Weeks    Status  Achieved      PT SHORT TERM GOAL #2   Title  Pt will improve TUG to less than or equal to 15 seconds for decreased fall risk.    Baseline  17.5 sec 06/30/2019    Time  4    Period  Weeks    Status  Not Met      PT SHORT TERM GOAL #3   Title  Berg Balance test to be assessed, with goal to be written as appropriate.    Baseline  scored 47/56 on 06/10/19 - LTG written as appropriate.    Time  4    Period  Weeks    Status  Achieved      PT SHORT TERM GOAL #4   Title  Pt will improve 5x sit<>stand to less than or equal to 18 seconds for improved functional strength.    Baseline  16.93 sec 06/30/2019    Time  4    Period  Weeks    Status  Achieved      PT SHORT TERM GOAL #5   Title  Pt/husband will verbalize understanding of fall prevention in home environment.    Time  4    Period  Weeks    Status  Achieved        PT Long Term Goals - 06/30/19 0919      PT LONG TERM GOAL #1   Title  Pt will perform HEP with supervision of family for improved balance, strength, gait.  Updates TARGET (due to initial delay in scheduline):   07/30/2019    Time  8    Period  Weeks    Status  On-going      PT LONG TERM GOAL #2   Title  Pt will improve gait velocity to at least 2 ft/sec for improved gait efficiency and safety in the community.    Time  8    Period  Weeks    Status  On-going      PT LONG TERM GOAL #3   Title  Pt will improve BERG to at least 50/56 to demo decr fall risk.    Baseline  47/56 on 06/10/19    Time  8    Period  Weeks    Status  On-going      PT LONG TERM GOAL #4   Title  Pt will improve 5x sit<>stand  to less than or equal to 15 seconds for improved functional strength.    Time  8    Period  Weeks    Status  On-going      PT LONG TERM GOAL #5   Title  Pt will ambulate  at least 1000 ft, modified independently with rollator, indoor and outdoor surfaces, for return to community mobility.    Time  8    Period  Weeks    Status  On-going            Plan - 07/09/19 1420    Clinical Impression Statement  Standing balance exercises progressed to performing on compliant surfaces today, with UE support, with supervision.  Also progressed gait distance on outdoor surfaces.  Pt noted to take decreased step length with forward/back stepping on compliant surfaces, but overall improving with balance and towards goals.    PT Frequency  2x / week    PT Duration  8 weeks   plus eval   PT Treatment/Interventions  ADLs/Self Care Home Management;DME Instruction;Neuromuscular re-education;Balance training;Therapeutic activities;Therapeutic exercise;Functional mobility training;Gait training;Patient/family education    PT Next Visit Plan  Continue gait training with rollator (outside for longer distances); work on balance at counter and in corner, including compliant surfaces.  Work towards [employer] and Agree with Plan of Care  Patient;Family member/caregiver    Family Member Consulted  Husband       Patient will benefit from skilled therapeutic intervention in order to improve the following deficits and impairments:  Abnormal gait, Decreased balance, Decreased mobility, Decreased strength, Postural dysfunction, Other (comment)  Visit Diagnosis: Unsteadiness on feet  Other abnormalities of gait and mobility     Problem List Patient Active Problem List   Diagnosis Date Noted  . Chronic diarrhea 05/05/2019  . Bipolar I disorder, most recent episode depressed ([place])   . Transaminitis   . Essential hypertension   . Seizures ([place])   . Encephalopathy 04/26/2019  . Leukocytosis 04/21/2019  . Thrombocytopenia ([place]) 09/04/2018  . Vitamin D deficiency 09/04/2018  . Psoriatic arthritis ([place]) 09/04/2018  . Tubular adenoma of colon 05/28/2017  . 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 Malignant neoplasm of overlapping sites of left breast in female, estrogen receptor positive ([place]) 03/14/2016  . Chronic kidney disease, stage III (moderate) 01/19/2016  . Tardive dyskinesia 10/18/2015  . History of breast cancer left 2016 12/27/2014  . Osteopenia determined by x-ray 10/31/2014  . Rosacea 05/21/2007  . Bipolar affective disorder, mixed ([place]) 08/16/2004  . Acquired hypothyroidism 04/03/1996    Frazier Butt. 07/09/2019, 2:23 PM  Frazier Butt., PT   [facility] [address] [place], [name], [address] Phone: [phone]   Fax:  [phone]  Name: Tiffany [name] MRN: [id number] Date of Birth: 02-Dec-1950

## 2019-07-12 ENCOUNTER — Ambulatory Visit (INDEPENDENT_AMBULATORY_CARE_PROVIDER_SITE_OTHER): Payer: Medicare Other | Admitting: Psychology

## 2019-07-12 DIAGNOSIS — F314 Bipolar disorder, current episode depressed, severe, without psychotic features: Secondary | ICD-10-CM | POA: Diagnosis not present

## 2019-07-13 ENCOUNTER — Ambulatory Visit: Payer: Medicare Other | Admitting: Occupational Therapy

## 2019-07-13 ENCOUNTER — Ambulatory Visit: Payer: Medicare Other | Admitting: Physical Therapy

## 2019-07-14 ENCOUNTER — Ambulatory Visit: Payer: Medicare Other | Admitting: Psychology

## 2019-07-15 ENCOUNTER — Ambulatory Visit (INDEPENDENT_AMBULATORY_CARE_PROVIDER_SITE_OTHER): Payer: Medicare Other | Admitting: Psychology

## 2019-07-15 DIAGNOSIS — F314 Bipolar disorder, current episode depressed, severe, without psychotic features: Secondary | ICD-10-CM

## 2019-07-16 ENCOUNTER — Encounter: Payer: Self-pay | Admitting: Occupational Therapy

## 2019-07-16 ENCOUNTER — Ambulatory Visit: Payer: Medicare Other | Admitting: Occupational Therapy

## 2019-07-16 ENCOUNTER — Ambulatory Visit: Payer: Medicare Other | Attending: Family Medicine | Admitting: Physical Therapy

## 2019-07-16 ENCOUNTER — Other Ambulatory Visit: Payer: Self-pay

## 2019-07-16 VITALS — BP 145/96

## 2019-07-16 DIAGNOSIS — M6281 Muscle weakness (generalized): Secondary | ICD-10-CM

## 2019-07-16 DIAGNOSIS — R41841 Cognitive communication deficit: Secondary | ICD-10-CM | POA: Diagnosis present

## 2019-07-16 DIAGNOSIS — R29818 Other symptoms and signs involving the nervous system: Secondary | ICD-10-CM | POA: Diagnosis present

## 2019-07-16 DIAGNOSIS — R278 Other lack of coordination: Secondary | ICD-10-CM

## 2019-07-16 DIAGNOSIS — M25611 Stiffness of right shoulder, not elsewhere classified: Secondary | ICD-10-CM

## 2019-07-16 DIAGNOSIS — M25612 Stiffness of left shoulder, not elsewhere classified: Secondary | ICD-10-CM | POA: Insufficient documentation

## 2019-07-16 DIAGNOSIS — R2681 Unsteadiness on feet: Secondary | ICD-10-CM

## 2019-07-16 DIAGNOSIS — R41844 Frontal lobe and executive function deficit: Secondary | ICD-10-CM | POA: Diagnosis present

## 2019-07-16 DIAGNOSIS — R2689 Other abnormalities of gait and mobility: Secondary | ICD-10-CM | POA: Diagnosis present

## 2019-07-16 NOTE — Therapy (Signed)
[facility] [address] [place], [name], [address] Phone: [phone]   Fax:  [phone]  Physical Therapy Treatment  Patient Details  Name: Tiffany [name] MRN: [id number] Date of Birth: [birth year] Referring Provider (PT): Marlowe Shores, [place]   Encounter Date: 07/16/2019  PT End of Session - 07/16/19 1014    Visit Number  9    Number of Visits  17    Date for PT Re-Evaluation  19/50/93   90 day recert for 8 wk POC   Authorization Type  Medicare/AARP- will need 10th visit progress note    Progress Note Due on Visit  10    PT Start Time  0930    PT Stop Time  1013    PT Time Calculation (min)  43 min    Equipment Utilized During Treatment  Gait belt    Activity Tolerance  Patient tolerated treatment well    Behavior During Therapy  [facility] for tasks assessed/performed       Past Medical History:  Diagnosis Date  . Bipolar 1 disorder ([place])   . Breast cancer ([place])   . Chronic diarrhea    loose stools twice a day on average for years  . Chronic kidney disease (CKD), stage III (moderate)   . Depression 1987  . Malignant neoplasm of overlapping sites of left breast in female, estrogen receptor positive ([place]) 03/14/2016   Dx in 09/2014, s/p bilateral mastectomies and ALND, 0/10 LN. 1.4 cm  Grade I invasive lobular, ER and PR +/Her--, Ki 67 <5% Tried anastrozole for one month, but developed suicidal idea  . Parkinson's disease ([place]) 2012  . Psoriatic arthritis ([place])   . PTSD (post-traumatic stress disorder)   . Secondary hyperparathyroidism ([place])   . Tardive dyskinesia     Past Surgical History:  Procedure Laterality Date  . ABDOMINAL HYSTERECTOMY  1987  . CHOLECYSTECTOMY  1979  . DILATION AND CURETTAGE OF UTERUS  1973  . MASTECTOMY Bilateral 09/19/2014  . TONSILLECTOMY  1970  . URETERAL REIMPLANTION Bilateral 1974    There were no vitals filed for this visit.  Subjective Assessment - 07/16/19 0932    Subjective  Got  her vaccine last Friday - had side effects earlier this week, Feeling better today. Woke up with a migraine, but now it is gone.    Patient is accompained by:  Family member    Pertinent History  OIZ:[id number], bipolar 1 disorder, psoriatic arthritis, Parkinsonism (medication-induced), COVID-19 positive on 04/03/2019    Patient Stated Goals  Pt's goals for therapy are to improve strength and balance.    Currently in Pain?  No/denies    Pain Onset  Yesterday         NMR:    Pt performs PWR! Moves in sitting position     PWR! Up for improved posture x10 reps   PWR! Rock for improved weighshifting x5 reps B, cues to look up at hand   PWR! Twist for improved trunk rotation x5 reps B, cues to look towards hand   PWR! Step for improved step initiation x5 reps B - step out and out and then step in and in   Min verbal/demonstrative cues for technique and intensity.               [facility] Adult PT Treatment/Exercise - 07/16/19 0539      Neuro Re-ed    Neuro Re-ed Details   In corner on foam: wider BOS with eyes closed 1st rep  5 seconds, 2nd rep 20 seconds, last 2 reps 30 seconds, with narrow BOS and eyes open 2 x 10 reps head nods, 2 x 10 reps head turns. With wider BOS and eyes closed 3 x 5 reps head nods, 3 x 5 reps head turns - added to HEP       Knee/Hip Exercises: Aerobic   Nustep  NuStep level 5 with BUE and BLE for strengthening, ROM, activity tolerance x4 minutes              PT Education - 07/16/19 1013    Education Details  standing balance additions in corner to HEP    Person(s) Educated  Patient;Spouse    Methods  Explanation;Demonstration;Handout    Comprehension  Verbalized understanding;Returned demonstration;Verbal cues required       PT Short Term Goals - 06/30/19 0912      PT SHORT TERM GOAL #1   Title  Pt will perform HEP with family supervision for improved strength, balance, and gait.  TARGET:  06/18/2019    Time  4    Period  Weeks     Status  Achieved      PT SHORT TERM GOAL #2   Title  Pt will improve TUG to less than or equal to 15 seconds for decreased fall risk.    Baseline  17.5 sec 06/30/2019    Time  4    Period  Weeks    Status  Not Met      PT SHORT TERM GOAL #3   Title  Berg Balance test to be assessed, with goal to be written as appropriate.    Baseline  scored 47/56 on 06/10/19 - LTG written as appropriate.    Time  4    Period  Weeks    Status  Achieved      PT SHORT TERM GOAL #4   Title  Pt will improve 5x sit<>stand to less than or equal to 18 seconds for improved functional strength.    Baseline  16.93 sec 06/30/2019    Time  4    Period  Weeks    Status  Achieved      PT SHORT TERM GOAL #5   Title  Pt/husband will verbalize understanding of fall prevention in home environment.    Time  4    Period  Weeks    Status  Achieved        PT Long Term Goals - 06/30/19 0919      PT LONG TERM GOAL #1   Title  Pt will perform HEP with supervision of family for improved balance, strength, gait.  Updates TARGET (due to initial delay in scheduline):   07/30/2019    Time  8    Period  Weeks    Status  On-going      PT LONG TERM GOAL #2   Title  Pt will improve gait velocity to at least 2 ft/sec for improved gait efficiency and safety in the community.    Time  8    Period  Weeks    Status  On-going      PT LONG TERM GOAL #3   Title  Pt will improve BERG to at least 50/56 to demo decr fall risk.    Baseline  47/56 on 06/10/19    Time  8    Period  Weeks    Status  On-going      PT LONG TERM GOAL #4   Title  Pt  will improve 5x sit<>stand to less than or equal to 15 seconds for improved functional strength.    Time  8    Period  Weeks    Status  On-going      PT LONG TERM GOAL #5   Title  Pt will ambulate at least 1000 ft, modified independently with rollator, indoor and outdoor surfaces, for return to community mobility.    Time  8    Period  Weeks    Status  On-going             Plan - 07/16/19 1019    Clinical Impression Statement  Performed seated PWR moves today with pt needing min verbal and tactile cues. Progressed corner balance on foam surface with wide BOS and eyes closed with head motions - added to HEP. Pt needing intermittent seated/standing breaks during session due to fatigue. Will continue to progress towards LTGs.    PT Frequency  2x / week    PT Duration  8 weeks   plus eval   PT Treatment/Interventions  ADLs/Self Care Home Management;DME Instruction;Neuromuscular re-education;Balance training;Therapeutic activities;Therapeutic exercise;Functional mobility training;Gait training;Patient/family education    PT Next Visit Plan  needs 10th visit PN, how were corner balance exercises? Continue gait training with rollator (outside for longer distances); work on balance at counter and in corner, including compliant surfaces.  Work towards [employer] and Agree with Plan of Care  Patient;Family member/caregiver    Family Member Consulted  Husband       Patient will benefit from skilled therapeutic intervention in order to improve the following deficits and impairments:  Abnormal gait, Decreased balance, Decreased mobility, Decreased strength, Postural dysfunction, Other (comment)  Visit Diagnosis: Muscle weakness (generalized)  Other lack of coordination  Other symptoms and signs involving the nervous system  Unsteadiness on feet     Problem List Patient Active Problem List   Diagnosis Date Noted  . Chronic diarrhea 05/05/2019  . Bipolar I disorder, most recent episode depressed ([place])   . Transaminitis   . Essential hypertension   . Seizures ([place])   . Encephalopathy 04/26/2019  . Leukocytosis 04/21/2019  . Thrombocytopenia ([place]) 09/04/2018  . Vitamin D deficiency 09/04/2018  . Psoriatic arthritis ([place]) 09/04/2018  . Tubular adenoma of colon 05/28/2017  . 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 Malignant neoplasm of overlapping sites of left breast in female, estrogen receptor positive ([place]) 03/14/2016  . Chronic kidney disease, stage III (moderate) 01/19/2016  . Tardive dyskinesia 10/18/2015  . History of breast cancer left 2016 12/27/2014  . Osteopenia determined by x-ray 10/31/2014  . Rosacea 05/21/2007  . Bipolar affective disorder, mixed ([place]) 08/16/2004  . Acquired hypothyroidism 04/03/1996    Arliss Journey , PT, DPT  07/16/2019, 10:20 AM  [facility] [address] [place], [name], [address] Phone: [phone]   Fax:  [phone]  Name: Tiffany [name] MRN: [id number] Date of Birth: 10-24-1950

## 2019-07-16 NOTE — Therapy (Signed)
[facility] [address] [place], [name], [address] Phone: [phone]   Fax:  [phone]  Occupational Therapy Treatment  Patient Details  Name: Tiffany [name] MRN: [id number] Date of Birth: 1950/06/16 Referring Provider (OT): Dr. Ranell Patrick   Encounter Date: 07/16/2019  OT End of Session - 07/16/19 1046    Visit Number  9    Number of Visits  17    Date for OT Re-Evaluation  07/17/19    Authorization Type  Medicare    Authorization Time Period  auth period 90 days though 08/15/19 week 4/8    Authorization - Visit Number  9    Authorization - Number of Visits  10    Progress Note Due on Visit  10    OT Start Time  1018    OT Stop Time  1056    OT Time Calculation (min)  38 min    Activity Tolerance  Patient tolerated treatment well    Behavior During Therapy  [facility] for tasks assessed/performed       Past Medical History:  Diagnosis Date  . Bipolar 1 disorder ([place])   . Breast cancer ([place])   . Chronic diarrhea    loose stools twice a day on average for years  . Chronic kidney disease (CKD), stage III (moderate)   . Depression 1987  . Malignant neoplasm of overlapping sites of left breast in female, estrogen receptor positive ([place]) 03/14/2016   Dx in 09/2014, s/p bilateral mastectomies and ALND, 0/10 LN. 1.4 cm  Grade I invasive lobular, ER and PR +/Her--, Ki 67 <5% Tried anastrozole for one month, but developed suicidal idea  . Parkinson's disease ([place]) 2012  . Psoriatic arthritis ([place])   . PTSD (post-traumatic stress disorder)   . Secondary hyperparathyroidism ([place])   . Tardive dyskinesia     Past Surgical History:  Procedure Laterality Date  . ABDOMINAL HYSTERECTOMY  1987  . CHOLECYSTECTOMY  1979  . DILATION AND CURETTAGE OF UTERUS  1973  . MASTECTOMY Bilateral 09/19/2014  . TONSILLECTOMY  1970  . URETERAL REIMPLANTION Bilateral 1974    Vitals:   07/16/19 1034  BP: (!) 145/96         Treatment:Bag  exercises for simulated ADLs.( donning socks, donning shirt, pulling down shirt, drying back) Pt attempted simulated donning pants however this was very challenging, therapist recommends tha pt crosses ankle over knee to dress LE's. Pt performed stretching with ankle on opposite knee for simulated LB dressing. Copying small peg design on vertical surface for fine motor coordination and functional reaching with right and left UE's, min difficulty/ drops. Cane exercise for shoulder flexion and abduction, 10 reps each, min v.c                     OT Short Term Goals - 07/09/19 5465      OT SHORT TERM GOAL #1   Title  I with HEP.    Time  4    Period  Weeks    Status  Achieved    Target Date  06/17/19      OT SHORT TERM GOAL #2   Title  Pt will verbalize understanding of adpated strategies for ADLs/ I ADLs in order to maximize safety and I.    Time  4    Period  Weeks    Status  Achieved      OT SHORT TERM GOAL #3   Title  Pt will demonstrate ability  to write a paragraph with 100% legibility and no significant decrease in letter size.    Baseline  Pt reports handwriting becomes smalller when she writes a paragraph    Time  4    Period  Weeks    Status  Achieved      OT SHORT TERM GOAL #4   Title  Pt will demonstrate improved RUE functional reach/ use for ADLS as evidenced by increasing box/ blocks score by 3 blocks.    Time  4    Period  Weeks    Status  Achieved   met 58 blocks       OT Long Term Goals - 07/09/19 0915      OT LONG TERM GOAL #1   Title  Pt will demonstrate ability to retieve a lightweight object at 125 shoulder flexion with -5 elbow extension.- due 07/17/19    Baseline  120, -10    Time  8    Period  Weeks    Status  On-going      OT LONG TERM GOAL #2   Title  Pt will demonstrate ability to retrieve a lightweight object at 120 shoulder flexion with -5 elbow extension.    Baseline  115, -10    Time  8    Period  Weeks    Status  On-going       OT LONG TERM GOAL #3   Title  Pt will verbalize understanding of ways to keep thinking skills sharp and memory compensations prn.    Time  8    Period  Weeks    Status  On-going      OT LONG TERM GOAL #4   Title  Pt will perform basic cooking and home management with supervision.    Time  8    Period  Weeks    Status  On-going            Plan - 07/16/19 1039    Clinical Impression Statement  Pt is progressing towards goals. Pt demonstrates improving strength and endurance    OT Occupational Profile and History  Detailed Assessment- Review of Records and additional review of physical, cognitive, psychosocial history related to current functional performance    Occupational performance deficits (Please refer to evaluation for details):  ADL's;IADL's;Leisure;Social Participation    Body Structure / Function / Physical Skills  ADL;Balance;Mobility;Strength;Flexibility;FMC;Coordination;GMC;Decreased knowledge of precautions;ROM;Decreased knowledge of use of DME;IADL;Dexterity;Sensation;UE functional use    Youth worker;Understand;Thought    Rehab Potential  Good    Clinical Decision Making  Limited treatment options, no task modification necessary    Comorbidities Affecting Occupational Performance:  May have comorbidities impacting occupational performance    Modification or Assistance to Complete Evaluation   No modification of tasks or assist necessary to complete eval    OT Frequency  2x / week   plus eval, may d/c after 6 weeks dependent upon progress   OT Duration  8 weeks    OT Treatment/Interventions  Self-care/ADL training;Therapeutic exercise;Balance training;Functional Mobility Training;Aquatic Therapy;Neuromuscular education;Manual Therapy;Therapeutic activities;Energy conservation;Paraffin;DME and/or AE instruction;Cognitive remediation/compensation;Gait Training;Fluidtherapy;Moist Heat;Passive range of motion;Patient/family education    Plan  functtional  activity with big movements    Consulted and Agree with Plan of Care  Patient;Family member/caregiver    Family Member Consulted  husband       Patient will benefit from skilled therapeutic intervention in order to improve the following deficits and impairments:   Body Structure / Function / Physical Skills: ADL,  Balance, Mobility, Strength, Flexibility, FMC, Coordination, GMC, Decreased knowledge of precautions, ROM, Decreased knowledge of use of DME, IADL, Dexterity, Sensation, UE functional use Cognitive Skills: Memory, Understand, Thought     Visit Diagnosis: Muscle weakness (generalized)  Other lack of coordination  Other symptoms and signs involving the nervous system  Stiffness of right shoulder, not elsewhere classified  Stiffness of left shoulder, not elsewhere classified  Frontal lobe and executive function deficit    Problem List Patient Active Problem List   Diagnosis Date Noted  . Chronic diarrhea 05/05/2019  . Bipolar I disorder, most recent episode depressed ([place])   . Transaminitis   . Essential hypertension   . Seizures ([place])   . Encephalopathy 04/26/2019  . Leukocytosis 04/21/2019  . Thrombocytopenia ([place]) 09/04/2018  . Vitamin D deficiency 09/04/2018  . Psoriatic arthritis ([place]) 09/04/2018  . Tubular adenoma of colon 05/28/2017  . 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 Malignant neoplasm of overlapping sites of left breast in female, estrogen receptor positive ([place]) 03/14/2016  . Chronic kidney disease, stage III (moderate) 01/19/2016  . Tardive dyskinesia 10/18/2015  . History of breast cancer left 2016 12/27/2014  . Osteopenia determined by x-ray 10/31/2014  . Rosacea 05/21/2007  . Bipolar affective disorder, mixed ([place]) 08/16/2004  . Acquired hypothyroidism 04/03/1996    Rylee Nuzum 07/16/2019, 10:46 AM  [facility] [address] [place], [name], [address] Phone: [phone]   Fax:  [phone]  Name: Silvie Obremski MRN: [id number] Date of Birth: Oct 21, 1950

## 2019-07-16 NOTE — Patient Instructions (Signed)
Access Code: [email or web address] URL: [email or web address] Date: 07/16/2019 Prepared by: Janann August  Exercises Seated Hamstring Stretch - 1 x daily - 4-5 x weekly - 3 sets - 15 hold Seated March - 1 x daily - 4-5 x weekly - 3 sets - 10 reps Sit to Stand with Armchair - 1-2 x daily - 4-5 x weekly - 2 sets - 5 reps Seated Long Arc Quad - 1-2 x daily - 4-5 x weekly - 2 sets - 5 reps Seated Heel Toe Raises - 1 x daily - 4-5 x weekly - 2 sets - 10 reps Side Stepping with Counter Support - 1 x daily - 4 x weekly - 1 sets - 10 reps Alternating Step Taps with Counter Support - 1 x daily - 4 x weekly - 1 sets - 10 reps Lateral Weight Shift with Arm Raise - 1 x daily - 5 x weekly - 1-2 sets - 10 reps Staggered Stance Forward Backward Weight Shift with Counter Support - 1 x daily - 5 x weekly - 1-2 sets - 10 reps  New addition on 07/16/19  Standing Balance - Eyes Closed with Head Motions - 1 x daily - 5 x weekly - 3 sets - 5 reps - with wider BOS

## 2019-07-20 ENCOUNTER — Ambulatory Visit: Payer: Medicare Other | Admitting: Physical Therapy

## 2019-07-20 ENCOUNTER — Ambulatory Visit: Payer: Medicare Other | Admitting: Speech Pathology

## 2019-07-20 ENCOUNTER — Other Ambulatory Visit: Payer: Self-pay

## 2019-07-20 ENCOUNTER — Encounter: Payer: Self-pay | Admitting: Speech Pathology

## 2019-07-20 VITALS — BP 118/74 | HR 74

## 2019-07-20 DIAGNOSIS — M6281 Muscle weakness (generalized): Secondary | ICD-10-CM

## 2019-07-20 DIAGNOSIS — R2681 Unsteadiness on feet: Secondary | ICD-10-CM

## 2019-07-20 DIAGNOSIS — R29818 Other symptoms and signs involving the nervous system: Secondary | ICD-10-CM

## 2019-07-20 DIAGNOSIS — R278 Other lack of coordination: Secondary | ICD-10-CM

## 2019-07-20 DIAGNOSIS — R41841 Cognitive communication deficit: Secondary | ICD-10-CM

## 2019-07-20 NOTE — Therapy (Signed)
[facility] [address] [place], [name], [address] Phone: [phone]   Fax:  [phone]  Speech Language Pathology Treatment  Patient Details  Name: Tiffany [name] MRN: [id number] Date of Birth: [birth year] No data recorded  Encounter Date: 07/20/2019  End of Session - 07/20/19 1124    Visit Number  6    Number of Visits  17    Date for SLP Re-Evaluation  08/23/19    SLP Start Time  1017    SLP Stop Time   1100    SLP Time Calculation (min)  43 min    Activity Tolerance  Patient tolerated treatment well       Past Medical History:  Diagnosis Date  . Bipolar 1 disorder ([place])   . Breast cancer ([place])   . Chronic diarrhea    loose stools twice a day on average for years  . Chronic kidney disease (CKD), stage III (moderate)   . Depression 1987  . Malignant neoplasm of overlapping sites of left breast in female, estrogen receptor positive ([place]) 03/14/2016   Dx in 09/2014, s/p bilateral mastectomies and ALND, 0/10 LN. 1.4 cm  Grade I invasive lobular, ER and PR +/Her--, Ki 67 <5% Tried anastrozole for one month, but developed suicidal idea  . Parkinson's disease ([place]) 2012  . Psoriatic arthritis ([place])   . PTSD (post-traumatic stress disorder)   . Secondary hyperparathyroidism ([place])   . Tardive dyskinesia     Past Surgical History:  Procedure Laterality Date  . ABDOMINAL HYSTERECTOMY  1987  . CHOLECYSTECTOMY  1979  . DILATION AND CURETTAGE OF UTERUS  1973  . MASTECTOMY Bilateral 09/19/2014  . TONSILLECTOMY  1970  . URETERAL REIMPLANTION Bilateral 1974    There were no vitals filed for this visit.  Subjective Assessment - 07/20/19 1105    Subjective  "I went into [employer], but I got too fatigued"    Patient is accompained by:  Family member   spouse, [name]   Currently in Pain?  No/denies            ADULT SLP TREATMENT - 07/20/19 1106      General Information   Behavior/Cognition  Alert;Cooperative       Treatment Provided   Treatment provided  Cognitive-Linquistic      Cognitive-Linquistic Treatment   Treatment focused on  Cognition;Patient/family/caregiver education    Skilled Treatment  Danielly continues to report using her to do list and keeping a schedule. Feeling overwhelmed persists. Briya verbalizes increases awareness sensing frustration and realizing that needs to take a break. She and [name] report success cooking and with IADL's. Trained pt and spouse in compensations for attention, and slow processing in group conversation as her family is coming to visit next month. Educated [place] and [place] on strategies to reduce fatigue and brain fog while shopping.       Assessment / Recommendations / Plan   Plan  Continue with current plan of care      Progression Toward Goals   Progression toward goals  Progressing toward goals       SLP Education - 07/20/19 1112    Education Details  strategies to manage sensory processing in stores to reduce fatigue and brain fog, strategies for conversation processing    Person(s) Educated  Patient;Spouse    Methods  Explanation;Demonstration    Comprehension  Verbalized understanding;Verbal cues required       SLP Short Term Goals - 07/20/19 1124  SLP SHORT TERM GOAL #1   Title  pt will demo WNL organization in simple - mod complex communicative tasks in 3 sessions with modified independence    Baseline  07-06-19    Status  Partially Met      SLP SHORT TERM GOAL #2   Title  pt will demo error awareness in cognitve linguistic tasks in the therapy session with occaisonal min A in 3 sessions    Status  Deferred   pt and husband agree pt is better with error awareness     SLP SHORT TERM GOAL #3   Title  pt will bring a memory compensation device/item to therapy in 5 sessions    Baseline  07-06-19    Status  Partially Met      SLP SHORT TERM GOAL #4   Title  pt will demo selective attention in 5 minutes min-mod noisy environment with rare  min A back to task over 3 sessions    Baseline  06-29-19, 07-06-19    Status  Partially Met       SLP Long Term Goals - 07/20/19 1124      SLP LONG TERM GOAL #1   Title  Pt will use a memory compensation device to track meds, appointments, errands, recent events, etc with min nonverbal cues in 8 sessions    Time  4    Period  Weeks   or 17 sessions, for all LTGs   Status  On-going      SLP LONG TERM GOAL #2   Title  pt will demo [facility] organization skills for mod complex cogntive linguistic tasks with extra time allowed in 3 sessions    Time  4    Period  Weeks    Status  On-going      SLP LONG TERM GOAL #3   Title  pt will demonstrate anticipatory awareness by spontaneously double checking a detailed written or auditory task in 5 therapy sessions    Baseline  07-06-19    Time  4    Period  Weeks    Status  On-going      SLP LONG TERM GOAL #4   Title  pt will demo [facility] organization in mod complex communicative tasks in 3 sessions with modified independence    Time  5    Period  Weeks    Status  On-going      SLP LONG TERM GOAL #5   Title  pt will demo simple divided attention in 8 minutes simple-mod complex tasks over three sessions    Time  4    Period  Weeks    Status  On-going       Plan - 07/20/19 1113    Clinical Impression Statement  Pt presents with mild cognitive communication deficits as measured by Cognitive Linguistic Quick Test (CLQT).Pt's exact premorbid deficts other than memory are somewhat unknown. Pt adjusted ST plan to once every other week at this time as she and husband feel she is slowly adjusting to learning her fatigue level, and education from SLP to anticipate fatigue level and/or respond to unexpected fatigue has been helpful in how pt adjusts her schedule for the day. SLP believes pt will benefit from skilled ST targeting cognitive communication deficits to maximize pt's skills to commnicate and interact in the home and community, [place] more sesions.     Speech Therapy Frequency  --   every other week   Duration  --   8 weeks  or 17 visits   Treatment/Interventions  Environmental controls;Functional tasks;Compensatory techniques;SLP instruction and feedback;Cueing hierarchy;Cognitive reorganization;Internal/external aids;Patient/family education    Potential to Achieve Goals  Good    Potential Considerations  Previous level of function       Patient will benefit from skilled therapeutic intervention in order to improve the following deficits and impairments:   Cognitive communication deficit    Problem List Patient Active Problem List   Diagnosis Date Noted  . Chronic diarrhea 05/05/2019  . Bipolar I disorder, most recent episode depressed ([place])   . Transaminitis   . Essential hypertension   . Seizures ([place])   . Encephalopathy 04/26/2019  . Leukocytosis 04/21/2019  . Thrombocytopenia ([place]) 09/04/2018  . Vitamin D deficiency 09/04/2018  . Psoriatic arthritis ([place]) 09/04/2018  . Tubular adenoma of colon 05/28/2017  . 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 Malignant neoplasm of overlapping sites of left breast in female, estrogen receptor positive ([place]) 03/14/2016  . Chronic kidney disease, stage III (moderate) 01/19/2016  . Tardive dyskinesia 10/18/2015  . History of breast cancer left 2016 12/27/2014  . Osteopenia determined by x-ray 10/31/2014  . Rosacea 05/21/2007  . Bipolar affective disorder, mixed ([place]) 08/16/2004  . Acquired hypothyroidism 04/03/1996    Ayssa Bentivegna, Annye Rusk MS, [place] 07/20/2019, 11:25 AM  [facility] [address] [place], [name], [address] Phone: [phone]   Fax:  [phone]   Name: Meda Dudzinski MRN: [id number] Date of Birth: Jul 25, 1950

## 2019-07-20 NOTE — Patient Instructions (Addendum)
   Be aware of how much stimulation is in the environment   When there are crowds, bright lights, noises such as [employer] or [employer] or loud restaurants - your brain is processing and filtering a ton of sensory input  Consider wearing a ball cap or sunglasses to do some of the filtering for your brain  Limit time in a store to 15-20 minutes, then as you are able, increase the time by 5-10 minutes to build endurance and tolerance of all of the sensory input  Go when you are rested and it is not crowded  When you start to run 2-3 errands in a row, take a break in your car in between errands - you can close your eyes and focus on breathing in the parking lot before going to the next place  Group conversation is rapid and topic changes very quickly, you may find that your processing is a touch slower. That is OK and normal when you're brain is healing. If you need to take a break, do.  Limit visits and phone conversations to 15-20 minutes if you are not feeling up to it. Tell the person up front, "The doctor said I can only talk for 20 minutes, then I have to take a break"  Set boundries and let your kids and friends that you need breaks and that your body and brain are still healing. You are in physical therapy and your endurance is not fully back yet.   [employer] [facility] in [place]  If you are feeling frustrated, check your environment for noise or lights that may be annoying your brain  When you start going out and joining groups, pay attention to how your body and brain are doing.  Have a  signal with [name] that you need a break or need to leave a gathering or social situation so he can help you  People do not understand brain injuries - they think if you can walk and talk and you look good on the outside, you should be functioning just as before the injury. They can't see your brain   To help you process speech and conversation, your family can:  Get your attention before speak to  you  Use  eye contact and face  you   Be in close proximity to  you while they are speaking to, rather than talking from another room  Turn down any noise in the environment such as the TV, walk away from loud appliances, air conditioners, fans, dish washers etc  If you need more time to get words out, tell them the ST said they are not to interrupt you, but should give you extra time to speak  [place] job listening to your body and resting or taking breaks when you need to!!

## 2019-07-20 NOTE — Therapy (Signed)
[facility] [address] [place], [name], [address] Phone: [phone]   Fax:  [phone]  Physical Therapy Treatment/10th Visit Progress Note  Patient Details  Name: Tiffany [name] MRN: [id number] Date of Birth: April 12, 1950 Referring Provider (PT): Mar[place]e [place], [place]  10th Visit Physical Therapy Progress Note  Dates of Reporting Period: 05/18/19 to 07/20/19    Encounter Date: 07/20/2019  PT End of Session - 07/20/19 1144    Visit Number  10    Number of Visits  17    Date for PT Re-Evaluation  29/93/71   90 day recert for 8 wk POC   Authorization Type  Medicare/[employer]- will need 10th visit progress note    Progress Note Due on Visit  10    PT Start Time  1103    PT Stop Time  1142    PT Time Calculation (min)  39 min    Equipment Utilized During Treatment  Gait belt    Activity Tolerance  Patient tolerated treatment well   needed seated rest breaks   Behavior During Therapy  WFL for tasks assessed/performed       Past Medical History:  Diagnosis Date  . Bipolar 1 disorder ([place])   . Breast cancer ([place])   . Chronic diarrhea    loose stools twice a day on average for years  . Chronic kidney disease (CKD), stage III (moderate)   . Depression 1987  . Malignant neoplasm of overlapping sites of left breast in female, estrogen receptor positive ([place]) 03/14/2016   Dx in 09/2014, s/p bilateral mastectomies and ALND, 0/10 LN. 1.4 cm  Grade I invasive lobular, ER and PR +/Her--, Ki 67 <5% Tried anastrozole for one month, but developed suicidal idea  . Parkinson's disease ([place]) 2012  . Psoriatic arthritis ([place])   . PTSD (post-traumatic stress disorder)   . Secondary hyperparathyroidism ([place])   . Tardive dyskinesia     Past Surgical History:  Procedure Laterality Date  . ABDOMINAL HYSTERECTOMY  1987  . CHOLECYSTECTOMY  1979  . DILATION AND CURETTAGE OF UTERUS  1973  . MASTECTOMY Bilateral 09/19/2014  . TONSILLECTOMY  1970   . URETERAL REIMPLANTION Bilateral 1974    Vitals:   07/20/19 1141  BP: 118/74  Pulse: 74    Subjective Assessment - 07/20/19 1105    Subjective  Feeling a little more slow today. No falls. Things are steadily getting better.    Patient is accompained by:  Family member    Pertinent History  IRC:[id number], bipolar 1 disorder, psoriatic arthritis, Parkinsonism (medication-induced), COVID-19 positive on 04/03/2019    Patient Stated Goals  Pt's goals for therapy are to improve strength and balance.    Currently in Pain?  No/denies    Pain Onset  [name] Adult PT Treatment/Exercise - 07/20/19 0001      Transfers   Sit to Stand  5: Supervision;Without upper extremity assist;From chair/3-in-1    Five time sit to stand comments   18.78     Stand to Sit  5: Supervision;Without upper extremity assist;To bed    Comments  had incr R knee pain      Ambulation/Gait   Ambulation/Gait  Yes    Ambulation/Gait Assistance  5: Supervision    Ambulation Distance (Feet)  230 Feet   throughout session   Assistive device  Rollator  Gait Pattern  Step-through pattern;Trunk flexed    Ambulation Surface  Level;Indoor    Gait velocity  12.31 seconds = 2.66 ft/sec    with rollator     Therapeutic Activites    Therapeutic Activities  Other Therapeutic Activities    Other Therapeutic Activities  Pt with elevated diastolic BP reading at last session with OT after exercise. Pt's BP WFL today, pt brought in BP log from home with BP WFL. discussed with pt and pt's husband about continuing to log and taking it 2x per day      Neuro Re-ed    Neuro Re-ed Details   Standing at countertop with light fingertip support: standing marching down and back 3 reps with cues for slowed and controlled and for SLS, cues for heel strike. On red compliant mat surface: reciprocal stepping over 3 obstacles - down and back 4 reps with cues for heel strike and single UE  support, min guard for balance. Stepping over small black beam with LLE and then stepping back to meet R x10 reps, Attempted with stepping RLE over, however pt reporting incr knee pain, so discontinued exercise. Retro gait down and back 3 reps with fingertip support, tandem walking down and back 3 reps with fingertip support. Min guard for all activities.              PT Education - 07/20/19 1425    Education Details  progress towards goals    Person(s) Educated  Patient;Spouse    Methods  Explanation    Comprehension  Verbalized understanding       PT Short Term Goals - 06/30/19 0912      PT SHORT TERM GOAL #1   Title  Pt will perform HEP with family supervision for improved strength, balance, and gait.  TARGET:  06/18/2019    Time  4    Period  Weeks    Status  Achieved      PT SHORT TERM GOAL #2   Title  Pt will improve TUG to less than or equal to 15 seconds for decreased fall risk.    Baseline  17.5 sec 06/30/2019    Time  4    Period  Weeks    Status  Not Met      PT SHORT TERM GOAL #3   Title  Berg Balance test to be assessed, with goal to be written as appropriate.    Baseline  scored 47/56 on 06/10/19 - LTG written as appropriate.    Time  4    Period  Weeks    Status  Achieved      PT SHORT TERM GOAL #4   Title  Pt will improve 5x sit<>stand to less than or equal to 18 seconds for improved functional strength.    Baseline  16.93 sec 06/30/2019    Time  4    Period  Weeks    Status  Achieved      PT SHORT TERM GOAL #5   Title  Pt/husband will verbalize understanding of fall prevention in home environment.    Time  4    Period  Weeks    Status  Achieved        PT Long Term Goals - 07/20/19 1117      PT LONG TERM GOAL #1   Title  Pt will perform HEP with supervision of family for improved balance, strength, gait.  Updates TARGET (due to initial delay in scheduline):   07/30/2019  Time  8    Period  Weeks    Status  On-going      PT LONG TERM GOAL #2    Title  Pt will improve gait velocity to at least 2 ft/sec for improved gait efficiency and safety in the community.    Baseline  2.66 ft/sec with rollator.    Time  8    Period  Weeks    Status  Achieved      PT LONG TERM GOAL #3   Title  Pt will improve BERG to at least 50/56 to demo decr fall risk.    Baseline  47/56 on 06/10/19    Time  8    Period  Weeks    Status  On-going      PT LONG TERM GOAL #4   Title  Pt will improve 5x sit<>stand to less than or equal to 15 seconds for improved functional strength.    Baseline  18.78 with no UE support from standard chair on 07/20/19, pt reporting incr R knee pain today.    Time  8    Period  Weeks    Status  On-going      PT LONG TERM GOAL #5   Title  Pt will ambulate at least 1000 ft, modified independently with rollator, indoor and outdoor surfaces, for return to community mobility.    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 Pt performed 5x sit <> stand in 18.78 seconds with no UE support (previously was 16.93 seconds), but pt reporting an incr in R knee pain today when performing. Remainder of session focused on standing balance with decr UE support next to countertop and on compliant surfaces. Pt needing intermittent seated rest breaks throughout session. Will continue to progress towards LTGs.    PT Frequency  2x / week    PT Duration  8 weeks   plus eval   PT Treatment/Interventions  ADLs/Self Care Home Management;DME Instruction;Neuromuscular re-education;Balance training;Therapeutic activities;Therapeutic exercise;Functional mobility training;Gait training;Patient/family education    PT Next Visit Plan  how were corner balance exercises? Continue  gait training with rollator (outside for longer distances); work on balance at counter and in corner, including compliant surfaces.  Work towards [employer] and Agree with Plan of Care  Patient;Family member/caregiver    Family Member Consulted  Husband       Patient will benefit from skilled therapeutic intervention in order to improve the following deficits and impairments:  Abnormal gait, Decreased balance, Decreased mobility, Decreased strength, Postural dysfunction, Other (comment)  Visit Diagnosis: Muscle weakness (generalized)  Other lack of coordination  Other symptoms and signs involving the nervous system  Unsteadiness on feet     Problem List Patient Active Problem List   Diagnosis Date Noted  . Chronic diarrhea 05/05/2019  . Bipolar I disorder, most recent episode depressed ([place])   . Transaminitis   . Essential hypertension   . Seizures ([place])   . Encephalopathy 04/26/2019  . Leukocytosis 04/21/2019  . Thrombocytopenia ([place]) 09/04/2018  . Vitamin D deficiency 09/04/2018  . Psoriatic arthritis ([place]) 09/04/2018  .  Tubular adenoma of colon 05/28/2017  . 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 Malignant neoplasm of overlapping sites of left breast in female, estrogen receptor positive ([place]) 03/14/2016  . Chronic kidney disease, stage III (moderate) 01/19/2016  . Tardive dyskinesia 10/18/2015  . History of breast cancer left 2016 12/27/2014  . Osteopenia determined by x-ray 10/31/2014  . Rosacea 05/21/2007  . Bipolar affective disorder, mixed ([place]) 08/16/2004  . Acquired hypothyroidism 04/03/1996    Arliss Journey, PT, DPT  07/20/2019, 2:34 PM  [facility] [address] [place], [name], [address] Phone: [phone]   Fax:  [phone]  Name: Laurel Harnden MRN: [id number] Date of Birth: [birth year]

## 2019-07-21 ENCOUNTER — Ambulatory Visit (INDEPENDENT_AMBULATORY_CARE_PROVIDER_SITE_OTHER): Payer: Medicare Other | Admitting: Psychology

## 2019-07-21 ENCOUNTER — Ambulatory Visit: Payer: Medicare Other | Admitting: Occupational Therapy

## 2019-07-21 DIAGNOSIS — M6281 Muscle weakness (generalized): Secondary | ICD-10-CM

## 2019-07-21 DIAGNOSIS — F341 Dysthymic disorder: Secondary | ICD-10-CM | POA: Diagnosis not present

## 2019-07-21 DIAGNOSIS — R41844 Frontal lobe and executive function deficit: Secondary | ICD-10-CM

## 2019-07-21 DIAGNOSIS — R2681 Unsteadiness on feet: Secondary | ICD-10-CM

## 2019-07-21 DIAGNOSIS — M25612 Stiffness of left shoulder, not elsewhere classified: Secondary | ICD-10-CM

## 2019-07-21 DIAGNOSIS — R278 Other lack of coordination: Secondary | ICD-10-CM

## 2019-07-21 DIAGNOSIS — M25611 Stiffness of right shoulder, not elsewhere classified: Secondary | ICD-10-CM

## 2019-07-21 DIAGNOSIS — R29818 Other symptoms and signs involving the nervous system: Secondary | ICD-10-CM

## 2019-07-21 NOTE — Therapy (Addendum)
[facility] [address] [place], [name], [address] Phone: [phone]   Fax:  [phone]  Occupational Therapy Treatment  Patient Details  Name: Tiffany [name] MRN: [id number] Date of Birth: 12-Jun-1950 Referring Provider (OT): Dr. Ranell Patrick   Encounter Date: 07/21/2019  OT End of Session - 07/21/19 1600    Visit Number  10    Number of Visits  17    Date for OT Re-Evaluation  07/17/19    Authorization Type  Medicare    Authorization Time Period  auth period 90 days though 08/15/19 week 6/8    Authorization - Visit Number  10    Authorization - Number of Visits  10    Progress Note Due on Visit  20    OT Start Time  0859    OT Stop Time  0938    OT Time Calculation (min)  39 min    Activity Tolerance  Patient tolerated treatment well    Behavior During Therapy  [facility] for tasks assessed/performed       Past Medical History:  Diagnosis Date  . Bipolar 1 disorder ([place])   . Breast cancer ([place])   . Chronic diarrhea    loose stools twice a day on average for years  . Chronic kidney disease (CKD), stage III (moderate)   . Depression 1987  . Malignant neoplasm of overlapping sites of left breast in female, estrogen receptor positive ([place]) 03/14/2016   Dx in 09/2014, s/p bilateral mastectomies and ALND, 0/10 LN. 1.4 cm  Grade I invasive lobular, ER and PR +/Her--, Ki 67 <5% Tried anastrozole for one month, but developed suicidal idea  . Parkinson's disease ([place]) 2012  . Psoriatic arthritis ([place])   . PTSD (post-traumatic stress disorder)   . Secondary hyperparathyroidism ([place])   . Tardive dyskinesia     Past Surgical History:  Procedure Laterality Date  . ABDOMINAL HYSTERECTOMY  1987  . CHOLECYSTECTOMY  1979  . DILATION AND CURETTAGE OF UTERUS  1973  . MASTECTOMY Bilateral 09/19/2014  . TONSILLECTOMY  1970  . URETERAL REIMPLANTION Bilateral 1974    There were no vitals filed for this visit.  Subjective Assessment -  07/21/19 1842    Subjective   pt reports using memory compensation strategies including lists and schedule and feels like she is doing much better and that therapy has helped    Pertinent History  69 y.o. female with medical history significant of hypothyroidism, bipolar 1 disorder, psoriatic arthritis, Parkinsonism, COVID-19 positive on 1/23.  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  Pt received therapies at [facility] and she was d/c home 05/05/19.    Patient Stated Goals  increase indepdence and balance    Currently in Pain?  No/denies         Standing, folding towels with good amplitude movements, coordination, ROM, and balance.  Standing, shoulder flex table slides with each UE for stretch/light wt. Bearing.  Sitting/standing, functional reaching to place/remove clothespins on vertical pole for overhead reaching with each UE and standing balance.  Standing, functional reaching to place small pegs in vertical pegboard to copy design with good accuracy,   min difficulty/drops with each UE.  Arm bike x6 min level 1 for reciprocal movement and activity tolerance without rest (forwards/backwards)  Began checking progress towards remaining goals (see goals section below for updates).  Pt reports that she  is wiping table and doing parts of meal (family cooking together).  Recommended pt begin to incr cognitive challenge including things like coloring, paint, crafts, drawing (she enjoyed drawing previously), card/board games, puzzles and incorporate grandchildren to incr engagement for keeping thinking skills sharp.  Although, pt reports that she was never good at puzzle-type activities.  Also recommended that pt/husband think about activities that she may want to address during remaining OT visits.        OT Short Term Goals - 07/09/19 7867      OT SHORT TERM GOAL  #1   Title  I with HEP.    Time  4    Period  Weeks    Status  Achieved    Target Date  06/17/19      OT SHORT TERM GOAL #2   Title  Pt will verbalize understanding of adpated strategies for ADLs/ I ADLs in order to maximize safety and I.    Time  4    Period  Weeks    Status  Achieved      OT SHORT TERM GOAL #3   Title  Pt will demonstrate ability to write a paragraph with 100% legibility and no significant decrease in letter size.    Baseline  Pt reports handwriting becomes smalller when she writes a paragraph    Time  4    Period  Weeks    Status  Achieved      OT SHORT TERM GOAL #4   Title  Pt will demonstrate improved RUE functional reach/ use for ADLS as evidenced by increasing box/ blocks score by 3 blocks.    Time  4    Period  Weeks    Status  Achieved   met 58 blocks       OT Long Term Goals - 07/21/19 0908      OT LONG TERM GOAL #1   Title  Pt will demonstrate ability to retieve a lightweight object at 125 shoulder flexion with -5 elbow extension.- due 07/17/19    Baseline  120, -10    Time  8    Period  Weeks    Status  Achieved      OT LONG TERM GOAL #2   Title  Pt will demonstrate ability to retrieve a lightweight object at 120 shoulder flexion with -5 elbow extension.    Baseline  115, -10    Time  8    Period  Weeks    Status  Achieved   07/21/19:  150* with -5* elbow extension     OT LONG TERM GOAL #3   Title  Pt will verbalize understanding of ways to keep thinking skills sharp and memory compensations prn.    Time  8    Period  Weeks    Status  On-going   07/21/19:  met with memory compensation strategies, needs continued education for ways to keep thinking skills sharp     OT LONG TERM GOAL #4   Title  Pt will perform basic cooking and home management with supervision.    Time  8    Period  Weeks    Status  On-going   07/21/19:  pt beginning to do more of these tasks           Plan - 07/21/19 1843    Clinical Impression Statement   Progress Note Reporting period 05/18/19-07/21/19.  Pt has made excellent progress towards goals.  Pt has met all STGs and 2/4  LTGs are now met with pt approximating remaining LTGs.  Pt demo improvements with shoulder ROM, use of memory compensation strategies, UE functional use/coordination, balance, and ADL/IADL performance.  Pt would benefit from additional occupational therapy to continue to address remaining goals for improved IADL performance.    OT Occupational Profile and History  Detailed Assessment- Review of Records and additional review of physical, cognitive, psychosocial history related to current functional performance    Occupational performance deficits (Please refer to evaluation for details):  ADL's;IADL's;Leisure;Social Participation    Body Structure / Function / Physical Skills  ADL;Balance;Mobility;Strength;Flexibility;FMC;Coordination;GMC;Decreased knowledge of precautions;ROM;Decreased knowledge of use of DME;IADL;Dexterity;Sensation;UE functional use    Youth worker;Understand;Thought    Rehab Potential  Good    Clinical Decision Making  Limited treatment options, no task modification necessary    Comorbidities Affecting Occupational Performance:  May have comorbidities impacting occupational performance    Modification or Assistance to Complete Evaluation   No modification of tasks or assist necessary to complete eval    OT Frequency  2x / week   plus eval, may d/c after 6 weeks dependent upon progress   OT Duration  8 weeks    OT Treatment/Interventions  Self-care/ADL training;Therapeutic exercise;Balance training;Functional Mobility Training;Aquatic Therapy;Neuromuscular education;Manual Therapy;Therapeutic activities;Energy conservation;Paraffin;DME and/or AE instruction;Cognitive remediation/compensation;Gait Training;Fluidtherapy;Moist Heat;Passive range of motion;Patient/family education    Plan  continue education/issue handout for keeping thinking skills sharp  (pt to try drawing at home); address remaining LTGs and any adaptions for cooking/home maintenance tasks    Consulted and Agree with Plan of Care  Patient;Family member/caregiver    Family Member Consulted  husband       Patient will benefit from skilled therapeutic intervention in order to improve the following deficits and impairments:   Body Structure / Function / Physical Skills: ADL, Balance, Mobility, Strength, Flexibility, FMC, Coordination, GMC, Decreased knowledge of precautions, ROM, Decreased knowledge of use of DME, IADL, Dexterity, Sensation, UE functional use Cognitive Skills: Memory, Understand, Thought     Visit Diagnosis: Muscle weakness (generalized)  Other lack of coordination  Other symptoms and signs involving the nervous system  Unsteadiness on feet  Stiffness of right shoulder, not elsewhere classified  Stiffness of left shoulder, not elsewhere classified  Frontal lobe and executive function deficit    Problem List Patient Active Problem List   Diagnosis Date Noted  . Chronic diarrhea 05/05/2019  . Bipolar I disorder, most recent episode depressed ([place])   . Transaminitis   . Essential hypertension   . Seizures ([place])   . Encephalopathy 04/26/2019  . Leukocytosis 04/21/2019  . Thrombocytopenia ([employer]) 09/04/2018  . Vitamin D deficiency 09/04/2018  . Psoriatic arthritis ([place]) 09/04/2018  . Tubular adenoma of colon 05/28/2017  . 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 Chronic kidney disease, stage III (moderate) 01/19/2016  . Tardive dyskinesia 10/18/2015  . History of breast cancer left 2016 12/27/2014  . Osteopenia determined by x-ray 10/31/2014  . Rosacea 05/21/2007  . Bipolar affective disorder, mixed ([place]) 08/16/2004  . Acquired hypothyroidism 04/03/1996    [facility] 07/21/2019, 7:02  PM  [facility] [address] [place], [name], [address] Phone: [phone]   Fax:  [phone]  Name: Tomeko Scoville MRN: [id number] Date of Birth: 09-04-1950   Vianne Bulls, OTR/L [facility] [address] [place], [place]  [address] [phone]  phone [phone] 07/21/19 7:02 PM

## 2019-07-23 ENCOUNTER — Ambulatory Visit: Payer: Medicare Other | Admitting: Physical Therapy

## 2019-07-23 ENCOUNTER — Ambulatory Visit (INDEPENDENT_AMBULATORY_CARE_PROVIDER_SITE_OTHER): Payer: Medicare Other | Admitting: Psychology

## 2019-07-23 DIAGNOSIS — F341 Dysthymic disorder: Secondary | ICD-10-CM

## 2019-07-26 ENCOUNTER — Ambulatory Visit (INDEPENDENT_AMBULATORY_CARE_PROVIDER_SITE_OTHER): Payer: Medicare Other | Admitting: Psychology

## 2019-07-26 DIAGNOSIS — F341 Dysthymic disorder: Secondary | ICD-10-CM | POA: Diagnosis not present

## 2019-07-27 ENCOUNTER — Ambulatory Visit: Payer: Medicare Other | Admitting: Physical Therapy

## 2019-07-27 ENCOUNTER — Encounter: Payer: Self-pay | Admitting: Occupational Therapy

## 2019-07-27 ENCOUNTER — Other Ambulatory Visit: Payer: Self-pay

## 2019-07-27 ENCOUNTER — Ambulatory Visit: Payer: Medicare Other | Admitting: Speech Pathology

## 2019-07-27 ENCOUNTER — Ambulatory Visit: Payer: Medicare Other | Admitting: Occupational Therapy

## 2019-07-27 DIAGNOSIS — R278 Other lack of coordination: Secondary | ICD-10-CM

## 2019-07-27 DIAGNOSIS — M6281 Muscle weakness (generalized): Secondary | ICD-10-CM

## 2019-07-27 DIAGNOSIS — R29818 Other symptoms and signs involving the nervous system: Secondary | ICD-10-CM

## 2019-07-27 DIAGNOSIS — R41841 Cognitive communication deficit: Secondary | ICD-10-CM

## 2019-07-27 DIAGNOSIS — R41844 Frontal lobe and executive function deficit: Secondary | ICD-10-CM

## 2019-07-27 DIAGNOSIS — R2681 Unsteadiness on feet: Secondary | ICD-10-CM

## 2019-07-27 NOTE — Patient Instructions (Signed)
Keeping Thinking Skills Sharp: 1. Jigsaw puzzles 2. Card/board games 3. Talking on the phone/social events 4. [email or web address] 5. Online games 6. Word searches/crossword puzzles 7.  Logic puzzles 8. Aerobic exercise (stationary bike) 9. Eating balanced diet (fruits & veggies) 10. Drink water 11. Try something new--new recipe, hobby 12. Crafts

## 2019-07-27 NOTE — Patient Instructions (Signed)
Great job trying to be aware of your energy and how much you are spending on your priorities.  Keep up your calendar and to do list; great idea to schedule rest breaks for yourself in advance.  Schedule high-demand activities for times when you are freshest, and make sure to build in rest time afterwards, as well as manage your expectations for the rest of the day.   Continue to prioritize and make time for the things that make you feel good or that are important to you.   Self-advocacy: communicate about your challenges openly. Remember that much of what you are dealing with is "invisible." Help others understand by asking for what you need and communicate your expectations in advance. Think especially about your daughter's upcoming visit and consider talking with her or emailing her.

## 2019-07-27 NOTE — Therapy (Signed)
[facility] [address] [place], [name], [address] Phone: [phone]   Fax:  [phone]  Speech Language Pathology Treatment and Discharge Summary  Patient Details  Name: Tiffany [name] MRN: [id number] Date of Birth: 12-13-1950 No data recorded  Encounter Date: 07/27/2019  End of Session - 07/27/19 1257    Visit Number  7    Number of Visits  17    Date for SLP Re-Evaluation  08/23/19    SLP Start Time  15    SLP Stop Time   1145    SLP Time Calculation (min)  43 min    Activity Tolerance  Patient tolerated treatment well       Past Medical History:  Diagnosis Date  . Bipolar 1 disorder ([place])   . Breast cancer ([place])   . Chronic diarrhea    loose stools twice a day on average for years  . Chronic kidney disease (CKD), stage III (moderate)   . Depression 1987  . Malignant neoplasm of overlapping sites of left breast in female, estrogen receptor positive ([place]) 03/14/2016   Dx in 09/2014, s/p bilateral mastectomies and ALND, 0/10 LN. 1.4 cm  Grade I invasive lobular, ER and PR +/Her--, Ki 67 <5% Tried anastrozole for one month, but developed suicidal idea  . Parkinson's disease ([place]) 2012  . Psoriatic arthritis ([place])   . PTSD (post-traumatic stress disorder)   . Secondary hyperparathyroidism ([place])   . Tardive dyskinesia     Past Surgical History:  Procedure Laterality Date  . ABDOMINAL HYSTERECTOMY  1987  . CHOLECYSTECTOMY  1979  . DILATION AND CURETTAGE OF UTERUS  1973  . MASTECTOMY Bilateral 09/19/2014  . TONSILLECTOMY  1970  . URETERAL REIMPLANTION Bilateral 1974    There were no vitals filed for this visit.  Subjective Assessment - 07/27/19 1019    Subjective  "I remember a lot of the things you told me."    Patient is accompained by:  Family member   spouse, [name]   Currently in Pain?  No/denies            ADULT SLP TREATMENT - 07/27/19 1020      General Information   Behavior/Cognition   Alert;Cooperative      Treatment Provided   Treatment provided  Cognitive-Linquistic      Cognitive-Linquistic Treatment   Treatment focused on  Cognition;Patient/family/caregiver education    Skilled Treatment  Patient reported using strategies when on outings; had a successful trip to [employer] and opted to rest in car during subsequent errands. Reports she is working to manage her energy and cognitive fatigue, and feels this has been successful. SLP encouraged pt to continue using her calendar and lists that have helped her, and to continue prioritizing and scheduling activities that are meaningful/necessary. Reported some anxiety about how she will manage having visitors; SLP encouraged pt to communicate her needs and expectations ahead of time and pt agreed this would be helpful.       Assessment / Recommendations / Plan   Plan  Discharge SLP treatment due to (comment)   pt pleased with progress     Progression Toward Goals   Progression toward goals  --   Goals partially met, pt pleased with current function      SLP Education - 07/27/19 1256    Education Details  continue use of strategies and compensations to manage fatigue, brain fog, sensory and conversation processing    Person(s) Educated  Patient;Spouse  Methods  Explanation;Handout    Comprehension  Verbalized understanding       SLP Short Term Goals - 07/27/19 1024      SLP SHORT TERM GOAL #1   Title  pt will demo WNL organization in simple - mod complex communicative tasks in 3 sessions with modified independence    Baseline  07-06-19    Status  Partially Met      SLP SHORT TERM GOAL #2   Title  pt will demo error awareness in cognitve linguistic tasks in the therapy session with occaisonal min A in 3 sessions    Status  Deferred   pt and husband agree pt is better with error awareness     SLP SHORT TERM GOAL #3   Title  pt will bring a memory compensation device/item to therapy in 5 sessions    Baseline   07-06-19    Status  Partially Met      SLP SHORT TERM GOAL #4   Title  pt will demo selective attention in 5 minutes min-mod noisy environment with rare min A back to task over 3 sessions    Baseline  06-29-19, 07-06-19    Status  Partially Met       SLP Long Term Goals - 07/27/19 1024      SLP LONG TERM GOAL #1   Title  Pt will use a memory compensation device to track meds, appointments, errands, recent events, etc with min nonverbal cues in 8 sessions    Time  3    Period  Weeks   or 17 sessions, for all LTGs   Status  Partially Met      SLP LONG TERM GOAL #2   Title  pt will demo [facility] organization skills for mod complex cogntive linguistic tasks with extra time allowed in 3 sessions    Time  4    Period  Weeks    Status  Not Met      SLP LONG TERM GOAL #3   Title  pt will demonstrate anticipatory awareness by spontaneously double checking a detailed written or auditory task in 5 therapy sessions    Baseline  07-06-19    Time  4    Period  Weeks    Status  Not Met      SLP LONG TERM GOAL #4   Title  pt will demo [facility] organization in mod complex communicative tasks in 3 sessions with modified independence    Time  5    Period  Weeks    Status  Not Met      SLP LONG TERM GOAL #5   Title  pt will demo simple divided attention in 8 minutes simple-mod complex tasks over three sessions    Time  4    Period  Weeks    Status  Not Met       Plan - 07/27/19 1257    Clinical Impression Statement  Patient continues with mild cognitive communication deficits; she is effectively using compensations and strategies to assist with managing her priority tasks at home, have conversations, and run errands in the community with assistance from spouse. At this time she is pleased with her current functional level; pt and husband in agreement with d/c today.    Speech Therapy Frequency  --   d/c   Duration  --   d/c   Treatment/Interventions  Environmental controls;Functional  tasks;Compensatory techniques;SLP instruction and feedback;Cueing hierarchy;Cognitive reorganization;Internal/external aids;Patient/family education    Potential  to Achieve Goals  Good    Potential Considerations  Previous level of function       Patient will benefit from skilled therapeutic intervention in order to improve the following deficits and impairments:   Cognitive communication deficit    Problem List Patient Active Problem List   Diagnosis Date Noted  . Chronic diarrhea 05/05/2019  . Bipolar I disorder, most recent episode depressed ([place])   . Transaminitis   . Essential hypertension   . Seizures ([place])   . Encephalopathy 04/26/2019  . Leukocytosis 04/21/2019  . Thrombocytopenia ([place]) 09/04/2018  . Vitamin D deficiency 09/04/2018  . Psoriatic arthritis ([place]) 09/04/2018  . Tubular adenoma of colon 05/28/2017  . 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 Malignant neoplasm of overlapping sites of left breast in female, estrogen receptor positive ([place]) 03/14/2016  . Chronic kidney disease, stage III (moderate) 01/19/2016  . Tardive dyskinesia 10/18/2015  . History of breast cancer left 2016 12/27/2014  . Osteopenia determined by x-ray 10/31/2014  . Rosacea 05/21/2007  . Bipolar affective disorder, mixed ([place]) 08/16/2004  . Acquired hypothyroidism 04/03/1996   SPEECH THERAPY DISCHARGE SUMMARY  Visits from Start of Care: 7  Current functional level related to goals / functional outcomes: Patient is effectively using compensations and strategies to assist with managing her priority tasks at home, have conversations, and run errands in the community with assistance from spouse. See goals above   Remaining deficits: Patient continues with mild cognitive communication deficits which she is managing with compensations.   Education / Equipment: Strategies/compensations for cognitive fatigue, information and sensory processing,  attention Plan: Patient agrees to discharge.  Patient goals were partially met. Patient is being discharged due to being pleased with the current functional level.  ?????        Deneise Lever, [place], CCC-SLP Speech-Language Pathologist  Aliene Altes 07/27/2019, 12:59 PM  [facility] [address] [place], [name], [address] Phone: [phone]   Fax:  [phone]   Name: Tiffany [name] MRN: [id number] Date of Birth: 1951/01/16

## 2019-07-27 NOTE — Therapy (Signed)
[facility] [address] [place], [name], [address] Phone: [phone]   Fax:  [phone]  Occupational Therapy Treatment  Patient Details  Name: Tiffany [name] MRN: [id number] Date of Birth: 1950-08-30 Referring Provider (OT): Dr. Ranell Patrick   Encounter Date: 07/27/2019  OT End of Session - 07/27/19 0939    Visit Number  11    Number of Visits  17    Date for OT Re-Evaluation  07/17/19    Authorization Type  Medicare    Authorization Time Period  auth period 90 days though 08/15/19 week 6/8    Authorization - Visit Number  11    Authorization - Number of Visits  20    Progress Note Due on Visit  49    OT Start Time  0935    OT Stop Time  1015    OT Time Calculation (min)  40 min    Activity Tolerance  Patient tolerated treatment well    Behavior During Therapy  [facility] for tasks assessed/performed       Past Medical History:  Diagnosis Date  . Bipolar 1 disorder ([place])   . Breast cancer ([place])   . Chronic diarrhea    loose stools twice a day on average for years  . Chronic kidney disease (CKD), stage III (moderate)   . Depression 1987  . Malignant neoplasm of overlapping sites of left breast in female, estrogen receptor positive ([place]) 03/14/2016   Dx in 09/2014, s/p bilateral mastectomies and ALND, 0/10 LN. 1.4 cm  Grade I invasive lobular, ER and PR +/Her--, Ki 67 <5% Tried anastrozole for one month, but developed suicidal idea  . Parkinson's disease ([place]) 2012  . Psoriatic arthritis ([place])   . PTSD (post-traumatic stress disorder)   . Secondary hyperparathyroidism ([place])   . Tardive dyskinesia     Past Surgical History:  Procedure Laterality Date  . ABDOMINAL HYSTERECTOMY  1987  . CHOLECYSTECTOMY  1979  . DILATION AND CURETTAGE OF UTERUS  1973  . MASTECTOMY Bilateral 09/19/2014  . TONSILLECTOMY  1970  . URETERAL REIMPLANTION Bilateral 1974    There were no vitals filed for this visit.  Subjective Assessment -  07/27/19 0938    Subjective   denies pain    Pertinent History  69 y.o. female with medical history significant of hypothyroidism, bipolar 1 disorder, psoriatic arthritis, Parkinsonism, COVID-19 positive on 1/23.  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  Pt received therapies at [facility] and she was d/c home 05/05/19.    Patient Stated Goals  increase indepdence and balance    Currently in Pain?  No/denies               Treatment: Simple cooking task to scramble an egg. Pt completed taks, locating items and cooking egg with supervision and min v.c Pt grasped side of pan on 1 occasion and was cautioned due to risk for burns. Pt was unharmed. Pt turned off the stove without cueing. Therapist recommends continued supervision with cooking at home for safety. Therapist issued Keeping thinking skills sharp handout and reviewed with pt/ husband. Arm bike x 5 mins level 1 for conditioning.              OT Short Term Goals - 07/27/19 4270      OT SHORT TERM GOAL #1   Title  I with HEP.  Time  4    Period  Weeks    Status  Achieved    Target Date  06/17/19      OT SHORT TERM GOAL #2   Title  Pt will verbalize understanding of adapted strategies for ADLs/ I ADLs in order to maximize safety and I.    Time  4    Period  Weeks    Status  Achieved      OT SHORT TERM GOAL #3   Title  Pt will demonstrate ability to write a paragraph with 100% legibility and no significant decrease in letter size.    Baseline  Pt reports handwriting becomes smalller when she writes a paragraph    Time  4    Period  Weeks    Status  Achieved      OT SHORT TERM GOAL #4   Title  Pt will demonstrate improved RUE functional reach/ use for ADLS as evidenced by increasing box/ blocks score by 3 blocks.    Time  4    Period  Weeks    Status  Achieved   met 58 blocks        OT Long Term Goals - 07/27/19 0953      OT LONG TERM GOAL #1   Title  Pt will demonstrate ability to retieve a lightweight object at 125 shoulder flexion with -5 elbow extension.- due 07/17/19    Baseline  120, -10    Time  8    Period  Weeks    Status  Achieved      OT LONG TERM GOAL #2   Title  Pt will demonstrate ability to retrieve a lightweight object at 120 shoulder flexion with -5 elbow extension.    Baseline  115, -10    Time  8    Period  Weeks    Status  Achieved   07/21/19:  150* with -5* elbow extension     OT LONG TERM GOAL #3   Title  Pt will verbalize understanding of ways to keep thinking skills sharp and memory compensations prn.    Time  8    Period  Weeks    Status  Achieved   07/21/19:  met with memory compensation strategies, needs continued education for ways to keep thinking skills sharp     OT LONG TERM GOAL #4   Title  Pt will perform basic cooking and home management with supervision.    Time  8    Period  Weeks    Status  On-going   supervision, min v.c for cooking, min A placing items in oven, does laundry mod !           Plan - 07/27/19 1206    Clinical Impression Statement  Pt demonstrates good overall progress. Anticipate d/c next visit.    OT Occupational Profile and History  Detailed Assessment- Review of Records and additional review of physical, cognitive, psychosocial history related to current functional performance    Occupational performance deficits (Please refer to evaluation for details):  ADL's;IADL's;Leisure;Social Participation    Body Structure / Function / Physical Skills  ADL;Balance;Mobility;Strength;Flexibility;FMC;Coordination;GMC;Decreased knowledge of precautions;ROM;Decreased knowledge of use of DME;IADL;Dexterity;Sensation;UE functional use    Youth worker;Understand;Thought    Rehab Potential  Good    Clinical Decision Making  Limited treatment options, no task modification necessary    Comorbidities  Affecting Occupational Performance:  May have comorbidities impacting occupational performance    Modification or Assistance to Complete Evaluation  No modification of tasks or assist necessary to complete eval    OT Frequency  2x / week   plus eval, may d/c after 6 weeks dependent upon progress   OT Duration  8 weeks    OT Treatment/Interventions  Self-care/ADL training;Therapeutic exercise;Balance training;Functional Mobility Training;Aquatic Therapy;Neuromuscular education;Manual Therapy;Therapeutic activities;Energy conservation;Paraffin;DME and/or AE instruction;Cognitive remediation/compensation;Gait Training;Fluidtherapy;Moist Heat;Passive range of motion;Patient/family education    Plan  address remaining LTGs and d/c next visit    Consulted and Agree with Plan of Care  Patient;Family member/caregiver    Family Member Consulted  husband       Patient will benefit from skilled therapeutic intervention in order to improve the following deficits and impairments:   Body Structure / Function / Physical Skills: ADL, Balance, Mobility, Strength, Flexibility, FMC, Coordination, GMC, Decreased knowledge of precautions, ROM, Decreased knowledge of use of DME, IADL, Dexterity, Sensation, UE functional use Cognitive Skills: Memory, Understand, Thought     Visit Diagnosis: Muscle weakness (generalized)  Other symptoms and signs involving the nervous system  Other lack of coordination  Frontal lobe and executive function deficit    Problem List Patient Active Problem List   Diagnosis Date Noted  . Chronic diarrhea 05/05/2019  . Bipolar I disorder, most recent episode depressed ([place])   . Transaminitis   . Essential hypertension   . Seizures ([place])   . Encephalopathy 04/26/2019  . Leukocytosis 04/21/2019  . Thrombocytopenia ([place]) 09/04/2018  . Vitamin D deficiency 09/04/2018  . Psoriatic arthritis ([place]) 09/04/2018  . Tubular adenoma of colon 05/28/2017  . Reaction to QuantiFERON-TB  test (QFT) without active tuberculosis 09/12/2016  . Malignant neoplasm of overlapping sites of left breast in female, estrogen receptor positive ([place]) 03/14/2016  . Chronic kidney disease, stage III (moderate) 01/19/2016  . Tardive dyskinesia 10/18/2015  . History of breast cancer left 2016 12/27/2014  . Osteopenia determined by x-ray 10/31/2014  . Rosacea 05/21/2007  . Bipolar affective disorder, mixed ([place]) 08/16/2004  . Acquired hypothyroidism 04/03/1996    Raymona Boss 07/27/2019, 12:08 PM  [facility] [address] [place], [name], [address] Phone: [phone]   Fax:  [phone]  Name: Prue Lingenfelter MRN: [id number] Date of Birth: 06-28-1950

## 2019-07-27 NOTE — Therapy (Signed)
[facility] [address] [place], [name], [address] Phone: [phone]   Fax:  [phone]  Physical Therapy Treatment  Patient Details  Name: Tiffany [name] MRN: [id number] Date of Birth: June 14, 1950 Referring Provider (PT): [place], [place]   Encounter Date: 07/27/2019  PT End of Session - 07/27/19 1217    Visit Number  11    Number of Visits  17    Date for PT Re-Evaluation  52/77/82   90 day recert for 8 wk POC   Authorization Type  Medicare/[employer]- will need 10th visit progress note    Progress Note Due on Visit  10    PT Start Time  1101    PT Stop Time  1143    PT Time Calculation (min)  42 min    Equipment Utilized During Treatment  Gait belt    Activity Tolerance  Patient tolerated treatment well   needed seated rest breaks   Behavior During Therapy  [facility] for tasks assessed/performed       Past Medical History:  Diagnosis Date  . Bipolar 1 disorder ([place])   . Breast cancer ([place])   . Chronic diarrhea    loose stools twice a day on average for years  . Chronic kidney disease (CKD), stage III (moderate)   . Depression 1987  . Malignant neoplasm of overlapping sites of left breast in female, estrogen receptor positive ([place]) 03/14/2016   Dx in 09/2014, s/p bilateral mastectomies and ALND, 0/10 LN. 1.4 cm  Grade I invasive lobular, ER and PR +/Her--, Ki 67 <5% Tried anastrozole for one month, but developed suicidal idea  . Parkinson's disease ([place]) 2012  . Psoriatic arthritis ([place])   . PTSD (post-traumatic stress disorder)   . Secondary hyperparathyroidism ([place])   . Tardive dyskinesia     Past Surgical History:  Procedure Laterality Date  . ABDOMINAL HYSTERECTOMY  1987  . CHOLECYSTECTOMY  1979  . DILATION AND CURETTAGE OF UTERUS  1973  . MASTECTOMY Bilateral 09/19/2014  . TONSILLECTOMY  1970  . URETERAL REIMPLANTION Bilateral 1974    There were no vitals filed for this visit.  Subjective Assessment -  07/27/19 1103    Subjective  Doing well - feels like she is really progressing. No falls.    Patient is accompained by:  Family member    Pertinent History  UMP:[id number], bipolar 1 disorder, psoriatic arthritis, Parkinsonism (medication-induced), COVID-19 positive on 04/03/2019    Patient Stated Goals  Pt's goals for therapy are to improve strength and balance.    Currently in Pain?  No/denies    Pain Onset  [name] PT Assessment - 07/27/19 1117      Berg Balance Test   Sit to Stand  Able to stand without using hands and stabilize independently    Standing Unsupported  Able to stand safely 2 minutes    Sitting with Back Unsupported but Feet Supported on Floor or Stool  Able to sit safely and securely 2 minutes    Stand to Sit  Sits safely with minimal use of hands    Transfers  Able to transfer safely, minor use of hands    Standing Unsupported with Eyes Closed  Able to stand 10 seconds safely    Standing Unsupported with Feet Together  Able to place feet together independently and stand 1 minute safely    From Standing, Reach Forward with Outstretched Arm  Can reach confidently >25  cm (10")    From Standing Position, Pick up Object from [place] to pick up shoe safely and easily    From Standing Position, Turn to Look Behind Over each Shoulder  Looks behind one side only/other side shows less weight shift    Turn 360 Degrees  Able to turn 360 degrees safely but slowly   L side 7.78 seconds, R side 8.4 seconds    Standing Unsupported, Alternately Place Feet on Step/Stool  Able to stand independently and safely and complete 8 steps in 20 seconds    Standing Unsupported, One Foot in Front  Able to plae foot ahead of the other independently and hold 30 seconds    Standing on One Leg  Able to lift leg independently and hold > 10 seconds   with LLE    Total Score  52    Berg comment:  52/56                    [facility] Adult PT Treatment/Exercise -  07/27/19 0001      Transfers   Sit to Stand  5: Supervision;Without upper extremity assist;From bed    Five time sit to stand comments   18.9 seconds with no UE support from mat table    Stand to Sit  5: Supervision;Without upper extremity assist;To bed    Comments  reviewed fall recovery strategies - with therapist demonstrating initial technique with turning to bed and using BUE to get down to floor and getting into half kneeling (using RLE to help lower, per pt this is stronger side), remained in tall kneeling position and then cued pt to bring R leg forward into half kneel and to use BUE to help push up to stand, with chair next to pt to turn and sit, min guard/min A for safety - pt tearful after performing due to reporting that she was proud of herself for performing and was initially very fearful to get down on the floor. will continue to practice in future sessions      Therapeutic Activites    Therapeutic Activities  Other Therapeutic Activities    Other Therapeutic Activities  Pt discussing having a lack of motivation at times to perform exercises at home, but knowing that she has to do them and setting aside time throughout the day to perform. Discussed with pt and pt's spouse about using an exercise tracker print out so pt can check off when pt has performed exercises throughout the day so she remembers what she has done. Pt very interested in this idea and same with pt's spouse - printed sheet out from [employer] for pt with current exercise program. Also discussed POC as pt's last visit is this week. Pt stating that she is not fully comfortable to be discharged and would be interested in one more week to finalize and review all of HEP and to continue to practice floor transfers and fall recovery.              PT Education - 07/27/19 1217    Education Details  progress towards goals, fall recovery, printed out exercise tracker for HEP    Person(s) Educated  Patient;Spouse    Methods   Explanation;Demonstration;Handout    Comprehension  Verbalized understanding;Returned demonstration;Need further instruction       PT Short Term Goals - 06/30/19 0912      PT SHORT TERM GOAL #1   Title  Pt will perform HEP with family supervision for  improved strength, balance, and gait.  TARGET:  06/18/2019    Time  4    Period  Weeks    Status  Achieved      PT SHORT TERM GOAL #2   Title  Pt will improve TUG to less than or equal to 15 seconds for decreased fall risk.    Baseline  17.5 sec 06/30/2019    Time  4    Period  Weeks    Status  Not Met      PT SHORT TERM GOAL #3   Title  Berg Balance test to be assessed, with goal to be written as appropriate.    Baseline  scored 47/56 on 06/10/19 - LTG written as appropriate.    Time  4    Period  Weeks    Status  Achieved      PT SHORT TERM GOAL #4   Title  Pt will improve 5x sit<>stand to less than or equal to 18 seconds for improved functional strength.    Baseline  16.93 sec 06/30/2019    Time  4    Period  Weeks    Status  Achieved      PT SHORT TERM GOAL #5   Title  Pt/husband will verbalize understanding of fall prevention in home environment.    Time  4    Period  Weeks    Status  Achieved        PT Long Term Goals - 07/27/19 1115      PT LONG TERM GOAL #1   Title  Pt will perform HEP with supervision of family for improved balance, strength, gait.  Updates TARGET (due to initial delay in scheduline):   07/30/2019    Time  8    Period  Weeks    Status  On-going      PT LONG TERM GOAL #2   Title  Pt will improve gait velocity to at least 2 ft/sec for improved gait efficiency and safety in the community.    Baseline  2.66 ft/sec with rollator.    Time  8    Period  Weeks    Status  Achieved      PT LONG TERM GOAL #3   Title  Pt will improve BERG to at least 50/56 to demo decr fall risk.    Baseline  52/56 on 07/27/19    Time  8    Period  Weeks    Status  Achieved      PT LONG TERM GOAL #4   Title  Pt  will improve 5x sit<>stand to less than or equal to 15 seconds for improved functional strength.    Baseline  18.98 seconds with no UE support from standard chair on 07/27/19    Time  8    Period  Weeks    Status  Not Met      PT LONG TERM GOAL #5   Title  Pt will ambulate at least 1000 ft, modified independently with rollator, indoor and outdoor surfaces, for return to community mobility.    Time  8    Period  Weeks    Status  On-going            Plan - 07/27/19 1338    Clinical Impression Statement  Focus of today's skilled session was beginning to assess pt's LTGs. Pt did not meet LTG #4 in regards to 5x sit <>stand, pt able to perform in 18.98 seconds with no UE  support from mat table, which did not change when last assessed for 10th visit progress note. Pt improved BERG score to 52/56 (previously 47/56) - indicating pt is at a lower risk for falls and met LTG #3. Pt requesting to work on floor transfers today if she were to have another fall. Got down to mat in just tall kneeling position and then stood back up using half kneel position and BUE support - pt will continue to benefit from practice. Pt reporting she is not yet ready to wrap up with PT at the end of this week, pt reports she would feel more comfortable with 2 more sessions next week to continue to practice floor transfers and finalize exercise program. Will check LTGs next session and then plan to re-cert for 1 additional week.    PT Frequency  2x / week    PT Duration  8 weeks   plus eval   PT Treatment/Interventions  ADLs/Self Care Home Management;DME Instruction;Neuromuscular re-education;Balance training;Therapeutic activities;Therapeutic exercise;Functional mobility training;Gait training;Patient/family education    PT Next Visit Plan  will need recert for additional 2x week for 1 week ([facility] to [facility]). finish checking LTGs. review HEP and modify/update as appropriate for final program. floor transfers. Continue gait  training with rollator (outside for longer distances)    Consulted and Agree with Plan of Care  Patient;Family member/caregiver    Family Member Consulted  Husband       Patient will benefit from skilled therapeutic intervention in order to improve the following deficits and impairments:  Abnormal gait, Decreased balance, Decreased mobility, Decreased strength, Postural dysfunction, Other (comment)  Visit Diagnosis: Muscle weakness (generalized)  Other lack of coordination  Other symptoms and signs involving the nervous system  Unsteadiness on feet     Problem List Patient Active Problem List   Diagnosis Date Noted  . Chronic diarrhea 05/05/2019  . Bipolar I disorder, most recent episode depressed ([place])   . Transaminitis   . Essential hypertension   . Seizures ([place])   . Encephalopathy 04/26/2019  . Leukocytosis 04/21/2019  . Thrombocytopenia ([place]) 09/04/2018  . Vitamin D deficiency 09/04/2018  . Psoriatic arthritis ([place]) 09/04/2018  . Tubular adenoma of colon 05/28/2017  . 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 Malignant neoplasm of overlapping sites of left breast in female, estrogen receptor positive ([place]) 03/14/2016  . Chronic kidney disease, stage III (moderate) 01/19/2016  . Tardive dyskinesia 10/18/2015  . History of breast cancer left 2016 12/27/2014  . Osteopenia determined by x-ray 10/31/2014  . Rosacea 05/21/2007  . Bipolar affective disorder, mixed ([place]) 08/16/2004  . Acquired hypothyroidism 04/03/1996    Arliss Journey, PT, DPT  07/27/2019, 1:43 PM  [facility] [address] [place], [name], [address] Phone: [phone]   Fax:  [phone]  Name: Tiffany [name] MRN: [id number] Date of Birth: May 03, 1950

## 2019-07-28 ENCOUNTER — Ambulatory Visit (INDEPENDENT_AMBULATORY_CARE_PROVIDER_SITE_OTHER): Payer: Medicare Other | Admitting: Psychology

## 2019-07-28 DIAGNOSIS — F341 Dysthymic disorder: Secondary | ICD-10-CM

## 2019-07-29 ENCOUNTER — Ambulatory Visit: Payer: Medicare Other | Admitting: Physical Therapy

## 2019-07-29 ENCOUNTER — Encounter: Payer: Self-pay | Admitting: Occupational Therapy

## 2019-07-29 ENCOUNTER — Ambulatory Visit: Payer: Medicare Other | Admitting: Occupational Therapy

## 2019-07-29 ENCOUNTER — Other Ambulatory Visit: Payer: Self-pay

## 2019-07-29 DIAGNOSIS — R2681 Unsteadiness on feet: Secondary | ICD-10-CM

## 2019-07-29 DIAGNOSIS — R29818 Other symptoms and signs involving the nervous system: Secondary | ICD-10-CM

## 2019-07-29 DIAGNOSIS — M6281 Muscle weakness (generalized): Secondary | ICD-10-CM

## 2019-07-29 DIAGNOSIS — R2689 Other abnormalities of gait and mobility: Secondary | ICD-10-CM

## 2019-07-29 DIAGNOSIS — R278 Other lack of coordination: Secondary | ICD-10-CM

## 2019-07-29 DIAGNOSIS — M25612 Stiffness of left shoulder, not elsewhere classified: Secondary | ICD-10-CM

## 2019-07-29 DIAGNOSIS — R41844 Frontal lobe and executive function deficit: Secondary | ICD-10-CM

## 2019-07-29 DIAGNOSIS — M25611 Stiffness of right shoulder, not elsewhere classified: Secondary | ICD-10-CM

## 2019-07-29 NOTE — Therapy (Signed)
[facility] [address] [place], [name], [address] Phone: [phone]   Fax:  [phone]  Physical Therapy Treatment  Patient Details  Name: Tiffany [name] MRN: [id number] Date of Birth: 1950/11/15 Referring Provider (PT): Tiffany [name], [place]   Encounter Date: 07/29/2019  PT End of Session - 07/29/19 1402    Visit Number  12    Number of Visits  17    Date for PT Re-Evaluation  31/51/76   90 day recert for 8 wk POC   Authorization Type  Medicare/AARP- will need 10th visit progress note    Progress Note Due on Visit  10    PT Start Time  1018    PT Stop Time  1056    PT Time Calculation (min)  38 min    Equipment Utilized During Treatment  Gait belt    Activity Tolerance  Patient tolerated treatment well   needed seated rest breaks   Behavior During Therapy  WFL for tasks assessed/performed       Past Medical History:  Diagnosis Date  . Bipolar 1 disorder ([place])   . Breast cancer ([place])   . Chronic diarrhea    loose stools twice a day on average for years  . Chronic kidney disease (CKD), stage III (moderate)   . Depression 1987  . Malignant neoplasm of overlapping sites of left breast in female, estrogen receptor positive ([place]) 03/14/2016   Dx in 09/2014, s/p bilateral mastectomies and ALND, 0/10 LN. 1.4 cm  Grade I invasive lobular, ER and PR +/Her--, Ki 67 <5% Tried anastrozole for one month, but developed suicidal idea  . Parkinson's disease ([place]) 2012  . Psoriatic arthritis ([place])   . PTSD (post-traumatic stress disorder)   . Secondary hyperparathyroidism ([place])   . Tardive dyskinesia     Past Surgical History:  Procedure Laterality Date  . ABDOMINAL HYSTERECTOMY  1987  . CHOLECYSTECTOMY  1979  . DILATION AND CURETTAGE OF UTERUS  1973  . MASTECTOMY Bilateral 09/19/2014  . TONSILLECTOMY  1970  . URETERAL REIMPLANTION Bilateral 1974    There were no vitals filed for this visit.  Subjective Assessment -  07/29/19 1020    Subjective  Doing pretty good today.    Patient is accompained by:  Family member    Pertinent History  [id number], bipolar 1 disorder, psoriatic arthritis, Parkinsonism (medication-induced), COVID-19 positive on 04/03/2019    Patient Stated Goals  Pt's goals for therapy are to improve strength and balance.    Currently in Pain?  No/denies          Therapeutic Activity: Floor >stand transfer, performed x 2 reps.  Pt starts sitting at edge of mat, and turns to go down into tall kneeling facing mat table.  1st rep:  pt performs tall kneeling to 1/2 kneeling then up to stand, turns around and sits at mat; PT provides minimal assistance.  PT provides instructions on fall recovery, in general, including pain, initial mobility of joints prior to movement, scooting or crawling to get to upper extremity support of sturdy object.  Instructed husband on how to assist using gait belt, positioning to provide min assistance for floor>stand.    2nd rep:  Pt goes down to tall kneeling facing mat table, then goes to side sit.  From side sitting, PT provides minimal assistance and cues for rocking momentum to get into quadruped.  From quadruped, pt places hands on mat table for UE support, then goes to 1/2 kneel  to stand, with minimal assistance.              [facility] Adult PT Treatment/Exercise - 07/29/19 0001      Ambulation/Gait   Ambulation/Gait  Yes    Ambulation/Gait Assistance  6: Modified independent (Device/Increase time);5: Supervision    Ambulation/Gait Assistance Details  Cues for posture, step length; pt has one standing rest break.    Ambulation Distance (Feet)  1000 Feet    Assistive device  Rollator    Gait Pattern  Step-through pattern;Trunk flexed    Ambulation Surface  Level;Unlevel;Outdoor;Indoor;Paved;Grass    Pre-Gait Activities  On grass, pt demonstrates increased step height and foot clearance.          Balance Exercises - 07/29/19 1052       Balance Exercises: Standing   Standing Eyes Opened  --    Standing Eyes Closed  5 reps;Head turns;Wide (BOA)   Head nods, standing at locked rollator   Partial Tandem Stance  Eyes open;Upper extremity support 1;Eyes closed   Head nods, head turns x 5 reps each   Other Standing Exercises  Sit<>stand on [place], no UE support, x 5 reps from mat surface        PT Education - 07/29/19 1401    Education Details  POC and progress towards goals; floor>stand transfer    Person(s) Educated  Patient;Spouse    Methods  Explanation;Demonstration;Verbal cues    Comprehension  Verbalized understanding;Returned demonstration       PT Short Term Goals - 06/30/19 0912      PT SHORT TERM GOAL #1   Title  Pt will perform HEP with family supervision for improved strength, balance, and gait.  TARGET:  06/18/2019    Time  4    Period  Weeks    Status  Achieved      PT SHORT TERM GOAL #2   Title  Pt will improve TUG to less than or equal to 15 seconds for decreased fall risk.    Baseline  17.5 sec 06/30/2019    Time  4    Period  Weeks    Status  Not Met      PT SHORT TERM GOAL #3   Title  Berg Balance test to be assessed, with goal to be written as appropriate.    Baseline  scored 47/56 on 06/10/19 - LTG written as appropriate.    Time  4    Period  Weeks    Status  Achieved      PT SHORT TERM GOAL #4   Title  Pt will improve 5x sit<>stand to less than or equal to 18 seconds for improved functional strength.    Baseline  16.93 sec 06/30/2019    Time  4    Period  Weeks    Status  Achieved      PT SHORT TERM GOAL #5   Title  Pt/husband will verbalize understanding of fall prevention in home environment.    Time  4    Period  Weeks    Status  Achieved        PT Long Term Goals - 07/29/19 1046      PT LONG TERM GOAL #1   Title  Pt will perform HEP with supervision of family for improved balance, strength, gait.  Updates TARGET (due to initial delay in scheduline):   07/30/2019    Time  8     Period  Weeks    Status  Achieved  PT LONG TERM GOAL #2   Title  Pt will improve gait velocity to at least 2 ft/sec for improved gait efficiency and safety in the community.    Baseline  2.66 ft/sec with rollator.    Time  8    Period  Weeks    Status  Achieved      PT LONG TERM GOAL #3   Title  Pt will improve BERG to at least 50/56 to demo decr fall risk.    Baseline  52/56 on 07/27/19    Time  8    Period  Weeks    Status  Achieved      PT LONG TERM GOAL #4   Title  Pt will improve 5x sit<>stand to less than or equal to 15 seconds for improved functional strength.    Baseline  18.98 seconds with no UE support from standard chair on 07/27/19    Time  8    Period  Weeks    Status  Not Met      PT LONG TERM GOAL #5   Title  Pt will ambulate at least 1000 ft, modified independently with rollator, indoor and outdoor surfaces, for return to community mobility.    Time  8    Period  Weeks    Status  Achieved            Plan - 07/29/19 1404    Clinical Impression Statement  Assessed remaining LTGs, with pt meeting LTG 1 for HEP and LTG 5 for gait on long distance outdoor surfaces.  Performed floor>stand transfers, this visit, with pt able to perform with minimal assistance.  Despite being fearful initially, she is able to perform with cues.  She has made nice progresswith PT sessions, and she would benefit from further skilled PT for additional 1-2 visits for updates to HEP and for further practice with floor>stand transfers.    PT Frequency  2x / week    PT Duration  8 weeks   plus eval   PT Treatment/Interventions  ADLs/Self Care Home Management;DME Instruction;Neuromuscular re-education;Balance training;Therapeutic activities;Therapeutic exercise;Functional mobility training;Gait training;Patient/family education    PT Next Visit Plan  Recert for 2x/wk for 1 week-updates to HEP, floor>stand transfers.    Consulted and Agree with Plan of Care  Patient;Family  member/caregiver    Family Member Consulted  Husband       Patient will benefit from skilled therapeutic intervention in order to improve the following deficits and impairments:  Abnormal gait, Decreased balance, Decreased mobility, Decreased strength, Postural dysfunction, Other (comment)  Visit Diagnosis: Unsteadiness on feet  Other abnormalities of gait and mobility  Muscle weakness (generalized)     Problem List Patient Active Problem List   Diagnosis Date Noted  . Chronic diarrhea 05/05/2019  . Bipolar I disorder, most recent episode depressed ([place])   . Transaminitis   . Essential hypertension   . Seizures ([place])   . Encephalopathy 04/26/2019  . Leukocytosis 04/21/2019  . Thrombocytopenia ([place]) 09/04/2018  . Vitamin D deficiency 09/04/2018  . Psoriatic arthritis ([place]) 09/04/2018  . Tubular adenoma of colon 05/28/2017  . 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 Malignant neoplasm of overlapping sites of left breast in female, estrogen receptor positive ([place]) 03/14/2016  . Chronic kidney disease, stage III (moderate) 01/19/2016  . Tardive dyskinesia 10/18/2015  . History of breast cancer left 2016 12/27/2014  . Osteopenia determined by x-ray 10/31/2014  . Rosacea 05/21/2007  . Bipolar affective disorder,  mixed ([place]) 08/16/2004  . Acquired hypothyroidism 04/03/1996    Homer Miller W. 07/29/2019, 4:29 PM Frazier Butt., PT [facility] [address] [place], [name], [address] Phone: [phone]   Fax:  [phone]  Name: Tiffany [name] MRN: [id number] Date of Birth: [birth year]  New goals for recert: PT Long Term Goals - 07/29/19 1642      PT LONG TERM GOAL #1   Title  Pt will perform final progression of HEP with supervision of family for improved balance, strength, gait.  Updated TARGET 08/20/2019    Time  1    Period  Weeks    Status  Revised      PT LONG TERM  GOAL #2   Title  Pt will perform floor>stand transfer with minimal assistance, with UE support, for improved fall recovery.    Time  1    Period  Weeks    Status  New      PT LONG TERM GOAL #3   Title  Pt will improve 5x sit<>stand to less than or equal to 15 seconds for improved functional strength.    Baseline  07/27/2019 18.98 sec      Mady Haagensen, PT 07/29/19 4:45 PM Phone: [phone] Fax: [phone]

## 2019-07-29 NOTE — Therapy (Signed)
[facility] [address] [place], [name], [address] Phone: [phone]   Fax:  [phone]  Occupational Therapy Treatment  Patient Details  Name: Tiffany [name] MRN: [id number] Date of Birth: [birth year] Referring Provider (OT): Dr. Ranell Patrick   Encounter Date: 07/29/2019  OT End of Session - 07/29/19 1244    Visit Number  12    Number of Visits  17    Date for OT Re-Evaluation  07/17/19    Authorization Type  Medicare    Authorization Time Period  auth period 90 days though 08/15/19 week 6/8    Authorization - Visit Number  12    Authorization - Number of Visits  20    Progress Note Due on Visit  20    OT Start Time  0935    OT Stop Time  1015    OT Time Calculation (min)  40 min    Activity Tolerance  Patient tolerated treatment well    Behavior During Therapy  [facility] for tasks assessed/performed       Past Medical History:  Diagnosis Date  . Bipolar 1 disorder ([place])   . Breast cancer ([place])   . Chronic diarrhea    loose stools twice a day on average for years  . Chronic kidney disease (CKD), stage III (moderate)   . Depression 1987  . Malignant neoplasm of overlapping sites of left breast in female, estrogen receptor positive ([place]) 03/14/2016   Dx in 09/2014, s/p bilateral mastectomies and ALND, 0/10 LN. 1.4 cm  Grade I invasive lobular, ER and PR +/Her--, Ki 67 <5% Tried anastrozole for one month, but developed suicidal idea  . Parkinson's disease ([place]) 2012  . Psoriatic arthritis ([place])   . PTSD (post-traumatic stress disorder)   . Secondary hyperparathyroidism ([place])   . Tardive dyskinesia     Past Surgical History:  Procedure Laterality Date  . ABDOMINAL HYSTERECTOMY  1987  . CHOLECYSTECTOMY  1979  . DILATION AND CURETTAGE OF UTERUS  1973  . MASTECTOMY Bilateral 09/19/2014  . TONSILLECTOMY  1970  . URETERAL REIMPLANTION Bilateral 1974    There were no vitals filed for this visit.  Subjective Assessment -  07/29/19 1237    Subjective   Pt agrees with d/c    Currently in Pain?  No/denies                 Treatment: Functional reaching in seated with trunk rotation, min v.c Standing to perform dynamic step and reach, min v.c for larger amplitude movements Arm bike x 5 mins level 1 for conditioning.          OT Education - 07/29/19 1245    Education Details  PWR! moves in seated and supine  from HEP, 10 reps each, min v.c and demonstration, reveiwed imporance of balancing activity and rest    Person(s) Educated  Patient;Spouse    Methods  Explanation;Demonstration;Verbal cues    Comprehension  Verbalized understanding;Returned demonstration       OT Short Term Goals - 07/27/19 0953      OT SHORT TERM GOAL #1   Title  I with HEP.    Time  4    Period  Weeks    Status  Achieved    Target Date  06/17/19      OT SHORT TERM GOAL #2   Title  Pt will verbalize understanding of adapted strategies for ADLs/ I ADLs in order to maximize safety and I.  Time  4    Period  Weeks    Status  Achieved      OT SHORT TERM GOAL #3   Title  Pt will demonstrate ability to write a paragraph with 100% legibility and no significant decrease in letter size.    Baseline  Pt reports handwriting becomes smalller when she writes a paragraph    Time  4    Period  Weeks    Status  Achieved      OT SHORT TERM GOAL #4   Title  Pt will demonstrate improved RUE functional reach/ use for ADLS as evidenced by increasing box/ blocks score by 3 blocks.    Time  4    Period  Weeks    Status  Achieved   met 58 blocks       OT Long Term Goals - 07/29/19 0951      OT LONG TERM GOAL #1   Title  Pt will demonstrate ability to retieve a lightweight object at 125 shoulder flexion with -5 elbow extension.- due 07/17/19    Baseline  120, -10    Time  8    Period  Weeks    Status  Achieved      OT LONG TERM GOAL #2   Title  Pt will demonstrate ability to retrieve a lightweight object at 120  shoulder flexion with -5 elbow extension.    Baseline  115, -10    Time  8    Period  Weeks    Status  Achieved   07/21/19:  150* with -5* elbow extension     OT LONG TERM GOAL #3   Title  Pt will verbalize understanding of ways to keep thinking skills sharp and memory compensations prn.    Time  8    Period  Weeks    Status  Achieved   07/21/19:  met with memory compensation strategies, needs continued education for ways to keep thinking skills sharp     OT LONG TERM GOAL #4   Title  Pt will perform basic cooking and home management with supervision.    Time  8    Period  Weeks    Status  Partially Met   supervision, min v.c for cooking, min A placing items in oven, does laundry mod I           Plan - 07/29/19 0955    Clinical Impression Statement  Pt demonstrates good overall progress.She agrees with plans for discharge.    OT Occupational Profile and History  Detailed Assessment- Review of Records and additional review of physical, cognitive, psychosocial history related to current functional performance    Occupational performance deficits (Please refer to evaluation for details):  ADL's;IADL's;Leisure;Social Participation    Body Structure / Function / Physical Skills  ADL;Balance;Mobility;Strength;Flexibility;FMC;Coordination;GMC;Decreased knowledge of precautions;ROM;Decreased knowledge of use of DME;IADL;Dexterity;Sensation;UE functional use    Youth worker;Understand;Thought    Rehab Potential  Good    Clinical Decision Making  Limited treatment options, no task modification necessary    Comorbidities Affecting Occupational Performance:  May have comorbidities impacting occupational performance    Modification or Assistance to Complete Evaluation   No modification of tasks or assist necessary to complete eval    OT Frequency  2x / week   plus eval, may d/c after 6 weeks dependent upon progress   OT Duration  8 weeks    OT Treatment/Interventions   Self-care/ADL training;Therapeutic exercise;Balance training;Functional Mobility Training;Aquatic Therapy;Neuromuscular education;Manual Therapy;Therapeutic  activities;Energy conservation;Paraffin;DME and/or AE instruction;Cognitive remediation/compensation;Gait Training;Fluidtherapy;Moist Heat;Passive range of motion;Patient/family education    Plan  d/c OT    Consulted and Agree with Plan of Care  Patient;Family member/caregiver    Family Member Consulted  husband       Patient will benefit from skilled therapeutic intervention in order to improve the following deficits and impairments:   Body Structure / Function / Physical Skills: ADL, Balance, Mobility, Strength, Flexibility, FMC, Coordination, GMC, Decreased knowledge of precautions, ROM, Decreased knowledge of use of DME, IADL, Dexterity, Sensation, UE functional use Cognitive Skills: Memory, Understand, Thought     Visit Diagnosis: Muscle weakness (generalized)  Other symptoms and signs involving the nervous system  Other lack of coordination  Frontal lobe and executive function deficit  Stiffness of right shoulder, not elsewhere classified  Stiffness of left shoulder, not elsewhere classified  Other abnormalities of gait and mobility  Unsteadiness on feet    Problem List Patient Active Problem List   Diagnosis Date Noted  . Chronic diarrhea 05/05/2019  . Bipolar I disorder, most recent episode depressed ([place])   . Transaminitis   . Essential hypertension   . Seizures ([place])   . Encephalopathy 04/26/2019  . Leukocytosis 04/21/2019  . Thrombocytopenia ([place]) 09/04/2018  . Vitamin D deficiency 09/04/2018  . Psoriatic arthritis ([place]) 09/04/2018  . Tubular adenoma of colon 05/28/2017  . 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 Malignant neoplasm of overlapping sites of left breast in female, estrogen receptor positive ([place]) 03/14/2016  . Chronic kidney disease, stage III  (moderate) 01/19/2016  . Tardive dyskinesia 10/18/2015  . History of breast cancer left 2016 12/27/2014  . Osteopenia determined by x-ray 10/31/2014  . Rosacea 05/21/2007  . Bipolar affective disorder, mixed ([place]) 08/16/2004  . Acquired hypothyroidism 04/03/1996  OCCUPATIONAL THERAPY DISCHARGE SUMMARY    Current functional level related to goals / functional outcomes: Pt made excellent overall progress. See progress towards goals.   Remaining deficits: cognitive deficits, decreased strength, decreased endurance, decreased functional mobility.   Education / Equipment: Pt was educated regarding safety for ADLs/IADLsand HEP. Pt verbalized understanding of all education. Plan: Patient agrees to discharge.  Patient goals were partially met. Patient is being discharged due to being pleased with the current functional level.  ?????      RINE,KATHRYN 07/29/2019, 12:47 PM Theone Murdoch, OTR/L [phone] Phone: [phone] 12:51 PM 07/29/19 [facility] [address] [place], [name], [address] Phone: [phone]   Fax:  [phone]  Name: Tiffany [name] MRN: [id number] Date of Birth: [birth year]

## 2019-08-04 ENCOUNTER — Ambulatory Visit (INDEPENDENT_AMBULATORY_CARE_PROVIDER_SITE_OTHER): Payer: Medicare Other | Admitting: Psychology

## 2019-08-04 ENCOUNTER — Ambulatory Visit: Payer: Medicare Other

## 2019-08-04 ENCOUNTER — Encounter: Payer: Self-pay | Admitting: Family Medicine

## 2019-08-04 DIAGNOSIS — F314 Bipolar disorder, current episode depressed, severe, without psychotic features: Secondary | ICD-10-CM | POA: Diagnosis not present

## 2019-08-05 ENCOUNTER — Encounter: Payer: Self-pay | Admitting: Adult Health

## 2019-08-05 ENCOUNTER — Other Ambulatory Visit: Payer: Self-pay

## 2019-08-05 ENCOUNTER — Ambulatory Visit (INDEPENDENT_AMBULATORY_CARE_PROVIDER_SITE_OTHER): Payer: Medicare Other | Admitting: Adult Health

## 2019-08-05 DIAGNOSIS — G4700 Insomnia, unspecified: Secondary | ICD-10-CM | POA: Diagnosis not present

## 2019-08-05 DIAGNOSIS — F4310 Post-traumatic stress disorder, unspecified: Secondary | ICD-10-CM | POA: Diagnosis not present

## 2019-08-05 DIAGNOSIS — F319 Bipolar disorder, unspecified: Secondary | ICD-10-CM

## 2019-08-05 DIAGNOSIS — F331 Major depressive disorder, recurrent, moderate: Secondary | ICD-10-CM

## 2019-08-05 DIAGNOSIS — F411 Generalized anxiety disorder: Secondary | ICD-10-CM | POA: Diagnosis not present

## 2019-08-05 NOTE — Progress Notes (Signed)
Tiffany [name] [id number] 20-Sep-1950 70 y.o.  Subjective:   Patient ID:  Tiffany [name] is a 69 y.o. (DOB [birth year]) female.  Chief Complaint: No chief complaint on file.   HPI Tiffany [name] presents to the office today for follow-up of PTSD, insomnia,GAD, BPD 1.  Describes mood today as "ok". Pleasant. Mood symptoms - denies depression, anxiety, and irritability.  Stating "II've been doing well, still doing well". Having some situational stressors. Can't function the way she used to. Can't handle things the way she used to. Stating "I can't cope like I want too". Working with rehab to get her balance - 2 to 3 days a week. Too many things happening at one time and "I can't handle it". Keeping a daily mood tracker to monitor ups and downs. Received second vaccine this week and had two days of negative symptoms. Daughter and husband and 2 grandsons coming over the weekend. Has not seen them in 2 years. Talking to friend on the phone. Improved interest and motivation. Taking medications as prescribed.  Energy levels continue to improve. Active, exercising regularly. Enjoys some usual interests and activities. Married. Lives with husband of 66 years and their daughter. Talking to family and friend.  Appetite adequate. Weight stable.   Sleeps better at night - using Ativan. Averages 6 to 8 hours. Rests during the day.  Focus and concentration stable. Completing tasks. Managing some aspects of household.  Denies SI or HI. Denies AH or VH.   Previous medications: Celexa, Zyprexa, Tegretol, Depakote, Serzone, Topamax, Seroquel, Effexor, Lexapro, Desipramine, Neurontin, Abilify, Geodon, Propanolol, Cymbalta, Cogentin, Trihexyphenadyl, Simet, Provigil, Selegiline, Requip, Amantadine, Prozac, Mirapex, Azilect, Metoclopramide, Baclofen, Artane, Namenda, Latuda.    GAD-7     Office Visit from 05/14/2019 in [facility]  Total GAD-7 Score  21    Mini-Mental     Office Visit from 06/07/2019  in [facility]  Total Score (max 30 points )  29    PHQ2-9     Office Visit from 05/14/2019 in [facility]  PHQ-2 Total Score  1  PHQ-9 Total Score  8       Review of Systems:  Review of Systems  Musculoskeletal: Negative for gait problem.  Neurological: Negative for tremors.  Psychiatric/Behavioral:       Please refer to HPI    Medications: I have reviewed the patient's current medications.  Current Outpatient Medications  Medication Sig Dispense Refill  . acetaminophen (TYLENOL) 500 MG tablet Take 500 mg by mouth daily as needed for headache (pain).     Marland Kitchen amLODipine (NORVASC) 2.5 MG tablet Take 1 tablet (2.5 mg total) by mouth daily. 90 tablet 3  . calcitRIOL (ROCALTROL) 0.25 MCG capsule Take 0.25 mcg by mouth every Monday, Wednesday, and Friday.     . Certolizumab Pegol (CIMZIA [place]) Inject 200 mg into the skin every 30 (thirty) days.    . Cholecalciferol 50 MCG (2000 UT) TABS Take 1 tablet (2,000 Units total) by mouth See admin instructions. Take 1 tablet by mouth daily 30 tablet 0  . lamoTRIgine (LAMICTAL) 100 MG tablet Take 1 tablet (100 mg total) by mouth at bedtime. 90 tablet 1  . levETIRAcetam (KEPPRA) 250 MG tablet TAKE 1 TABLET(250 MG) BY MOUTH TWICE DAILY 180 tablet 3  . levothyroxine (SYNTHROID) 112 MCG tablet Take 1 tablet (112 mcg total) by mouth daily at 6 (six) AM. 90 tablet 3  . loperamide (IMODIUM) 2 MG capsule Take 1 capsule (2 mg total) by  mouth as needed for diarrhea or loose stools. 30 capsule 0  . LORazepam (ATIVAN) 1 MG tablet Take 1 tablet (1 mg total) by mouth 2 (two) times daily. 60 tablet 2  . sodium bicarbonate 650 MG tablet Take 1 tablet (650 mg total) by mouth 2 (two) times daily. 60 tablet 0   No current facility-administered medications for this visit.    Medication Side Effects: None  Allergies:  Allergies  Allergen Reactions  . Aripiprazole Other (See Comments)    Parkinsonism     . Lactose  Intolerance (Gi) Diarrhea  . Methotrexate Other (See Comments)    Hair loss, severe stomatitis    . Cefdinir Diarrhea    Other reaction(s): Diarrhea Yeast infection and fever; negative c diff  . Etanercept Other (See Comments)    Headaches    . Exemestane Other (See Comments)    Suicidal thoughts with medication    . Fluoxetine Other (See Comments)    Parkinsonism  . Methylprednisolone Sodium Succ Other (See Comments)    Agitated mania  . Epinephrine Palpitations    tachycardia   . Nitrofurantoin Nausea And Vomiting and Rash         Past Medical History:  Diagnosis Date  . Bipolar 1 disorder ([place])   . Breast cancer ([place])   . Chronic diarrhea    loose stools twice a day on average for years  . Chronic kidney disease (CKD), stage III (moderate)   . Depression 1987  . Malignant neoplasm of overlapping sites of left breast in female, estrogen receptor positive ([place]) 03/14/2016   Dx in 09/2014, s/p bilateral mastectomies and ALND, 0/10 LN. 1.4 cm  Grade I invasive lobular, ER and PR +/Her--, Ki 67 <5% Tried anastrozole for one month, but developed suicidal idea  . Parkinson's disease ([place]) 2012  . Psoriatic arthritis ([place])   . PTSD (post-traumatic stress disorder)   . Secondary hyperparathyroidism ([place])   . Tardive dyskinesia     Family History  Problem Relation Age of Onset  . Cancer Mother   . Cancer Sister   . Stroke Maternal Grandfather   . Diabetes Paternal Grandfather   . Cancer Sister     Social History   Socioeconomic History  . Marital status: Married    Spouse name: Not on file  . Number of children: Not on file  . Years of education: Not on file  . Highest education level: Not on file  Occupational History  . Not on file  Tobacco Use  . Smoking status: Never Smoker  . Smokeless tobacco: Never Used  Substance and Sexual Activity  . Alcohol use: Never  . Drug use: Never  . Sexual activity: Not Currently  Other Topics Concern  . Not on file   Social History Narrative   Moved to area from [place] 08/2018   Lives one story home   Right handed.   Social Determinants of Health   Financial Resource Strain:   . Difficulty of Paying Living Expenses:   Food Insecurity:   . Worried About Charity fundraiser in the Last Year:   . Arboriculturist in the Last Year:   Transportation Needs:   . Film/video editor (Medical):   Marland Kitchen Lack of Transportation (Non-Medical):   Physical Activity:   . Days of Exercise per Week:   . Minutes of Exercise per Session:   Stress:   . Feeling of Stress :   Social Connections:   . Frequency of  Communication with Friends and Family:   . Frequency of Social Gatherings with Friends and Family:   . Attends Religious Services:   . Active Member of Clubs or Organizations:   . Attends Archivist Meetings:   Marland Kitchen Marital Status:   Intimate Partner Violence:   . Fear of Current or Ex-Partner:   . Emotionally Abused:   Marland Kitchen Physically Abused:   . Sexually Abused:     Past Medical History, Surgical history, Social history, and Family history were reviewed and updated as appropriate.   Please see review of systems for further details on the patient's review from today.   Objective:   Physical Exam:  There were no vitals taken for this visit.  Physical Exam Constitutional:      General: She is not in acute distress. Musculoskeletal:        General: No deformity.  Neurological:     Mental Status: She is alert and oriented to person, place, and time.     Coordination: Coordination normal.  Psychiatric:        Attention and Perception: Attention and perception normal. She does not perceive auditory or visual hallucinations.        Mood and Affect: Mood normal. Mood is not anxious or depressed. Affect is not labile, blunt, angry or inappropriate.        Speech: Speech normal.        Behavior: Behavior normal.        Thought Content: Thought content normal. Thought content is not paranoid  or delusional. Thought content does not include homicidal or suicidal ideation. Thought content does not include homicidal or suicidal plan.        Cognition and Memory: Cognition and memory normal.        Judgment: Judgment normal.     Comments: Insight intact     Lab Review:     Component Value Date/Time   NA 138 06/08/2019 0000   K 4.6 06/08/2019 0000   CL 105 06/08/2019 0000   CO2 26 (A) 06/08/2019 0000   GLUCOSE 98 05/03/2019 0553   BUN 29 (A) 06/08/2019 0000   CREATININE 2.0 (A) 06/08/2019 0000   CREATININE 1.86 (H) 05/03/2019 0553   CALCIUM 9.7 06/08/2019 0000   PROT 5.9 (L) 04/30/2019 0519   ALBUMIN 4.0 06/08/2019 0000   AST 34 04/30/2019 0519   ALT 57 (H) 04/30/2019 0519   ALKPHOS 93 04/30/2019 0519   BILITOT 0.4 04/30/2019 0519   GFRNONAA 24 06/08/2019 0000   GFRAA 28 06/08/2019 0000       Component Value Date/Time   WBC 8.3 05/03/2019 0553   RBC 3.91 05/03/2019 0553   HGB 11.7 (L) 05/03/2019 0553   HCT 37.8 05/03/2019 0553   PLT 177 05/03/2019 0553   MCV 96.7 05/03/2019 0553   MCH 29.9 05/03/2019 0553   MCHC 31.0 05/03/2019 0553   RDW 14.3 05/03/2019 0553   LYMPHSABS 2.5 04/27/2019 0544   MONOABS 0.5 04/27/2019 0544   EOSABS 0.2 04/27/2019 0544   BASOSABS 0.0 04/27/2019 0544    Lithium Lvl  Date Value Ref Range Status  04/21/2019 0.79 0.60 - 1.20 mmol/L Final    Comment:    Performed at [facility], [address], [place], [place] [address]     No results found for: PHENYTOIN, PHENOBARB, VALPROATE, CBMZ   .res Assessment: Plan:    Plan:  Lamictal 100mg  at hs Lorazepam 1mg  BID for anxiety  Also taking Keppra 250mg  BID  for seizures  Therapist - Tiffany [name] - seeing every 2 weeks  RTC 4 weeks  Discussed potential benefits, risk, and side effects of benzodiazepines to include potential risk of tolerance and dependence, as well as possible drowsiness.  Advised patient not to drive if experiencing drowsiness and to take lowest  possible effective dose to minimize risk of dependence and tolerance.  Counseled patient regarding potential benefits, risks, and side effects of Lamictal to include potential risk of Stevens-Johnson syndrome. Advised patient to stop taking Lamictal and contact office immediately if rash develops and to seek urgent medical attention if rash is severe and/or spreading quickly.    Diagnoses and all orders for this visit:  Insomnia, unspecified type  Generalized anxiety disorder  PTSD (post-traumatic stress disorder)  Bipolar I disorder ([place])  Major depressive disorder, recurrent episode, moderate ([place])     Please see After Visit Summary for patient specific instructions.  Future Appointments  Date Time Provider [place]  08/11/2019  1:00 PM [name], Tiffany Groves, LCSW [facility] None  08/12/2019  1:15 PM Arliss Journey, PT [facility]  08/13/2019  9:00 AM [name], Tiffany G, LCSW [facility] None  08/17/2019  1:15 PM Gilgannon, Chloe N, PT [facility]  08/18/2019  1:00 PM [name], Tiffany G, LCSW [facility] None  08/25/2019  1:00 PM [name], Tiffany G, LCSW [facility] None  09/01/2019  1:00 PM [name], Tiffany G, LCSW [facility] None  09/03/2019 10:30 AM Sheffield, Vida Roller R, PA-C [facility]  09/08/2019  1:00 PM [name], Tiffany G, LCSW [facility] None  09/24/2019  1:00 PM Leamon Arnt, MD [facility]  12/14/2019 11:30 AM Cameron Sprang, MD [facility] None    No orders of the defined types were placed in this encounter.   -------------------------------

## 2019-08-06 ENCOUNTER — Ambulatory Visit (INDEPENDENT_AMBULATORY_CARE_PROVIDER_SITE_OTHER): Payer: Medicare Other | Admitting: Psychology

## 2019-08-06 DIAGNOSIS — F341 Dysthymic disorder: Secondary | ICD-10-CM

## 2019-08-10 ENCOUNTER — Ambulatory Visit: Payer: Medicare Other | Admitting: Physical Therapy

## 2019-08-11 ENCOUNTER — Ambulatory Visit (INDEPENDENT_AMBULATORY_CARE_PROVIDER_SITE_OTHER): Payer: Medicare Other | Admitting: Psychology

## 2019-08-11 DIAGNOSIS — F341 Dysthymic disorder: Secondary | ICD-10-CM

## 2019-08-12 ENCOUNTER — Ambulatory Visit: Payer: Medicare Other | Attending: Family Medicine | Admitting: Physical Therapy

## 2019-08-12 ENCOUNTER — Other Ambulatory Visit: Payer: Self-pay

## 2019-08-12 ENCOUNTER — Encounter: Payer: Self-pay | Admitting: Physical Therapy

## 2019-08-12 DIAGNOSIS — R29818 Other symptoms and signs involving the nervous system: Secondary | ICD-10-CM

## 2019-08-12 DIAGNOSIS — M6281 Muscle weakness (generalized): Secondary | ICD-10-CM

## 2019-08-12 DIAGNOSIS — R2689 Other abnormalities of gait and mobility: Secondary | ICD-10-CM | POA: Diagnosis present

## 2019-08-12 DIAGNOSIS — R278 Other lack of coordination: Secondary | ICD-10-CM

## 2019-08-12 DIAGNOSIS — R2681 Unsteadiness on feet: Secondary | ICD-10-CM | POA: Diagnosis present

## 2019-08-12 NOTE — Therapy (Signed)
[facility] [address] [place], [name], [address] Phone: [phone]   Fax:  [phone]  Physical Therapy Treatment  Patient Details  Name: Tiffany [name] MRN: [id number] Date of Birth: 10-05-1950 Referring Provider (PT): Mar[place]e [place], [place]   Encounter Date: 08/12/2019  PT End of Session - 08/12/19 1709    Visit Number  13    Number of Visits  17    Date for PT Re-Evaluation  94/76/54   per recert 6/50/3546   Authorization Type  Medicare/[employer]- will need 10th visit progress note    Progress Note Due on Visit  10    PT Start Time  1315    PT Stop Time  1357    PT Time Calculation (min)  42 min    Equipment Utilized During Treatment  Gait belt    Activity Tolerance  Patient tolerated treatment well    Behavior During Therapy  [facility] for tasks assessed/performed       Past Medical History:  Diagnosis Date  . Bipolar 1 disorder ([place])   . Breast cancer ([place])   . Chronic diarrhea    loose stools twice a day on average for years  . Chronic kidney disease (CKD), stage III (moderate)   . Depression 1987  . Malignant neoplasm of overlapping sites of left breast in female, estrogen receptor positive ([place]) 03/14/2016   Dx in 09/2014, s/p bilateral mastectomies and ALND, 0/10 LN. 1.4 cm  Grade I invasive lobular, ER and PR +/Her--, Ki 67 <5% Tried anastrozole for one month, but developed suicidal idea  . Parkinson's disease ([place]) 2012  . Psoriatic arthritis ([place])   . PTSD (post-traumatic stress disorder)   . Secondary hyperparathyroidism ([place])   . Tardive dyskinesia     Past Surgical History:  Procedure Laterality Date  . ABDOMINAL HYSTERECTOMY  1987  . CHOLECYSTECTOMY  1979  . DILATION AND CURETTAGE OF UTERUS  1973  . MASTECTOMY Bilateral 09/19/2014  . TONSILLECTOMY  1970  . URETERAL REIMPLANTION Bilateral 1974    There were no vitals filed for this visit.  Subjective Assessment - 08/12/19 1318    Subjective  Thinks  she over did it the other day - daughter is coming into town this weekend and has been doing a lot to get the house ready. Feeling a little more weak today.    Patient is accompained by:  Family member    Pertinent History  FKC:[id number], bipolar 1 disorder, psoriatic arthritis, Parkinsonism (medication-induced), COVID-19 positive on 04/03/2019    Patient Stated Goals  Pt's goals for therapy are to improve strength and balance.    Currently in Pain?  No/denies                        [facility] Adult PT Treatment/Exercise - 08/12/19 1324      Neuro Re-ed    Neuro Re-ed Details   at countertop - walking marching down and back x3 reps with intermittent UE support, one episode of min A for balance, retro gait x2 reps down and back with intermittent UE support, min guard for balance      Knee/Hip Exercises: Aerobic   Nustep  NuStep level 4 with BLE and BUE for strengthening, ROM, and aerobic warm up x5 minutes          Access Code: [email or web address] URL: [email or web address] Date: 07/16/2019 Prepared by: Janann August  Exercises Seated Hamstring Stretch - 1 x daily - 4-5 x weekly -  3 sets - 15 hold Seated March - 1 x daily - 4-5 x weekly - 3 sets - 10 reps Sit to Stand with Armchair - 1-2 x daily - 4-5 x weekly - 2 sets - 5 reps Seated Long Arc Quad - 1-2 x daily - 4-5 x weekly - 2 sets - 5 reps Seated Heel Toe Raises - 1 x daily - 4-5 x weekly - 2 sets - 10 reps Side Stepping with Counter Support - 1 x daily - 4 x weekly - 1 sets - 10 reps Alternating Step Taps with Counter Support - 1 x daily - 4 x weekly - 1 sets - 10 reps Lateral Weight Shift with Arm Raise - 1 x daily - 5 x weekly - 1-2 sets - 10 reps Staggered Stance Forward Backward Weight Shift with Counter Support - 1 x daily - 5 x weekly - 1-2 sets - 10 reps Standing Balance - Eyes Closed with Head Motions - 1 x daily - 5 x weekly - 3 sets - 5 reps Sit to Stand without Arm Support - 1-2 x daily - 4-5 x  weekly - 2 sets - 5 reps    PT Education - 08/12/19 1709    Education Details  reviewed HEP    Person(s) Educated  Patient;Spouse    Methods  Explanation;Demonstration    Comprehension  Verbalized understanding;Returned demonstration       PT Short Term Goals - 06/30/19 0912      PT SHORT TERM GOAL #1   Title  Pt will perform HEP with family supervision for improved strength, balance, and gait.  TARGET:  06/18/2019    Time  4    Period  Weeks    Status  Achieved      PT SHORT TERM GOAL #2   Title  Pt will improve TUG to less than or equal to 15 seconds for decreased fall risk.    Baseline  17.5 sec 06/30/2019    Time  4    Period  Weeks    Status  Not Met      PT SHORT TERM GOAL #3   Title  Berg Balance test to be assessed, with goal to be written as appropriate.    Baseline  scored 47/56 on 06/10/19 - LTG written as appropriate.    Time  4    Period  Weeks    Status  Achieved      PT SHORT TERM GOAL #4   Title  Pt will improve 5x sit<>stand to less than or equal to 18 seconds for improved functional strength.    Baseline  16.93 sec 06/30/2019    Time  4    Period  Weeks    Status  Achieved      PT SHORT TERM GOAL #5   Title  Pt/husband will verbalize understanding of fall prevention in home environment.    Time  4    Period  Weeks    Status  Achieved        PT Long Term Goals - 07/29/19 1642      PT LONG TERM GOAL #1   Title  Pt will perform final progression of HEP with supervision of family for improved balance, strength, gait.  Updated TARGET 08/20/2019    Time  1    Period  Weeks    Status  Revised      PT LONG TERM GOAL #2   Title  Pt will perform floor>stand transfer  with minimal assistance, with UE support, for improved fall recovery.    Time  1    Period  Weeks    Status  New      PT LONG TERM GOAL #3   Title  Pt will improve 5x sit<>stand to less than or equal to 15 seconds for improved functional strength.    Baseline  07/27/2019 18.98 sec    Time   1    Period  Weeks    Status  New            Plan - 08/12/19 1710    Clinical Impression Statement  Focus of today's skilled session was reviewing pt's HEP for anticipated discharge next visit. Pt needed occassional cues on exercise, but was able to demo understanding for each. Discussed with pt importance of performing exercises consistently after d/c with pt verbalizing understanding. Will finish checking LTGs at next visit and discharge.    PT Frequency  2x / week    PT Duration  Other (comment)   1 additional week, per recert 4/74/2595   PT Treatment/Interventions  ADLs/Self Care Home Management;DME Instruction;Neuromuscular re-education;Balance training;Therapeutic activities;Therapeutic exercise;Functional mobility training;Gait training;Patient/family education    PT Next Visit Plan  Recert for 2x/wk for 1 week-updates to HEP, floor>stand transfers.    Consulted and Agree with Plan of Care  Patient;Family member/caregiver    Family Member Consulted  Husband       Patient will benefit from skilled therapeutic intervention in order to improve the following deficits and impairments:  Abnormal gait, Decreased balance, Decreased mobility, Decreased strength, Postural dysfunction, Other (comment)  Visit Diagnosis: Muscle weakness (generalized)  Other symptoms and signs involving the nervous system  Other lack of coordination  Other abnormalities of gait and mobility     Problem List Patient Active Problem List   Diagnosis Date Noted  . Chronic diarrhea 05/05/2019  . Bipolar I disorder, most recent episode depressed ([place])   . Transaminitis   . Essential hypertension   . Seizures ([place])   . Encephalopathy 04/26/2019  . Leukocytosis 04/21/2019  . Thrombocytopenia ([place]) 09/04/2018  . Vitamin D deficiency 09/04/2018  . Psoriatic arthritis ([place]) 09/04/2018  . Tubular adenoma of colon 05/28/2017  . 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 Malignant neoplasm of overlapping sites of left breast in female, estrogen receptor positive ([place]) 03/14/2016  . Chronic kidney disease, stage III (moderate) 01/19/2016  . Tardive dyskinesia 10/18/2015  . History of breast cancer left 2016 12/27/2014  . Osteopenia determined by x-ray 10/31/2014  . Rosacea 05/21/2007  . Bipolar affective disorder, mixed ([place]) 08/16/2004  . Acquired hypothyroidism 04/03/1996    Arliss Journey, PT, DPT  08/12/2019, 5:12 PM  [facility] [address] [place], [name], [address] Phone: [phone]   Fax:  [phone]  Name: Tiffany [name] MRN: [id number] Date of Birth: 06-20-1950

## 2019-08-12 NOTE — Patient Instructions (Signed)
Access Code: [email or web address] URL: [email or web address] Date: 07/16/2019 Prepared by: Janann August  Exercises Seated Hamstring Stretch - 1 x daily - 4-5 x weekly - 3 sets - 15 hold Seated March - 1 x daily - 4-5 x weekly - 3 sets - 10 reps Sit to Stand with Armchair - 1-2 x daily - 4-5 x weekly - 2 sets - 5 reps Seated Long Arc Quad - 1-2 x daily - 4-5 x weekly - 2 sets - 5 reps Seated Heel Toe Raises - 1 x daily - 4-5 x weekly - 2 sets - 10 reps Side Stepping with Counter Support - 1 x daily - 4 x weekly - 1 sets - 10 reps Alternating Step Taps with Counter Support - 1 x daily - 4 x weekly - 1 sets - 10 reps Lateral Weight Shift with Arm Raise - 1 x daily - 5 x weekly - 1-2 sets - 10 reps Staggered Stance Forward Backward Weight Shift with Counter Support - 1 x daily - 5 x weekly - 1-2 sets - 10 reps Standing Balance - Eyes Closed with Head Motions - 1 x daily - 5 x weekly - 3 sets - 5 reps Sit to Stand without Arm Support - 1-2 x daily - 4-5 x weekly - 2 sets - 5 reps

## 2019-08-13 ENCOUNTER — Ambulatory Visit: Payer: Medicare Other | Admitting: Psychology

## 2019-08-13 ENCOUNTER — Ambulatory Visit: Payer: Medicare Other | Admitting: Physical Therapy

## 2019-08-17 ENCOUNTER — Other Ambulatory Visit: Payer: Self-pay

## 2019-08-17 ENCOUNTER — Ambulatory Visit: Payer: Medicare Other | Admitting: Physical Therapy

## 2019-08-17 ENCOUNTER — Encounter: Payer: Self-pay | Admitting: Physical Therapy

## 2019-08-17 DIAGNOSIS — R29818 Other symptoms and signs involving the nervous system: Secondary | ICD-10-CM

## 2019-08-17 DIAGNOSIS — M6281 Muscle weakness (generalized): Secondary | ICD-10-CM | POA: Diagnosis not present

## 2019-08-17 DIAGNOSIS — R2681 Unsteadiness on feet: Secondary | ICD-10-CM

## 2019-08-17 DIAGNOSIS — R2689 Other abnormalities of gait and mobility: Secondary | ICD-10-CM

## 2019-08-17 DIAGNOSIS — R278 Other lack of coordination: Secondary | ICD-10-CM

## 2019-08-17 NOTE — Patient Instructions (Signed)
Access Code: [email or web address] URL: [email or web address] Date: 08/17/2019 Prepared by: Janann August  Exercises Seated Hamstring Stretch - 1 x daily - 4-5 x weekly - 3 sets - 15 hold Seated March - 1 x daily - 4-5 x weekly - 3 sets - 10 reps Seated Long Arc Quad - 1-2 x daily - 4-5 x weekly - 2 sets - 5 reps Seated Heel Toe Raises - 1 x daily - 4-5 x weekly - 2 sets - 10 reps Side Stepping with Counter Support - 1 x daily - 4 x weekly - 1 sets - 10 reps Alternating Step Taps with Counter Support - 1 x daily - 4 x weekly - 1 sets - 10 reps Lateral Weight Shift with Arm Raise - 1 x daily - 5 x weekly - 1-2 sets - 10 reps Staggered Stance Forward Backward Weight Shift with Counter Support - 1 x daily - 5 x weekly - 1-2 sets - 10 reps Standing Balance - Eyes Closed with Head Motions - 1 x daily - 5 x weekly - 3 sets - 5 reps Sit to Stand without Arm Support - 1-2 x daily - 4-5 x weekly - 2 sets - 5 reps

## 2019-08-17 NOTE — Therapy (Signed)
[facility] [address] [place], [place], [address] Phone: [phone]   Fax:  [phone]  Physical Therapy Treatment/Discharge Summary  Patient Details  Name: Tiffany [name] MRN: [id number] Date of Birth: 01/21/1951 Referring Provider (PT): Dan Angiulli, PA   Encounter Date: 08/17/2019  PT End of Session - 08/17/19 1413    Visit Number  14    Number of Visits  17    Date for PT Re-Evaluation  08/28/19   per recert 07/29/2019   Authorization Type  Medicare/[employer]- will need 10th visit progress note    Progress Note Due on Visit  10    PT Start Time  1319    PT Stop Time  1353   full time not used due to discharge   PT Time Calculation (min)  34 min    Equipment Utilized During Treatment  Gait belt    Activity Tolerance  Patient tolerated treatment well    Behavior During Therapy  WFL for tasks assessed/performed       Past Medical History:  Diagnosis Date  . Bipolar 1 disorder ([facility])   . Breast cancer ([facility])   . Chronic diarrhea    loose stools twice a day on average for years  . Chronic kidney disease (CKD), stage III (moderate)   . Depression 1987  . Malignant neoplasm of overlapping sites of left breast in female, estrogen receptor positive ([facility]) 03/14/2016   Dx in 09/2014, s/p bilateral mastectomies and ALND, 0/10 LN. 1.4 cm  Grade I invasive lobular, ER and PR +/Her--, Ki 67 <5% Tried anastrozole for one month, but developed suicidal idea  . Parkinson's disease ([facility]) 2012  . Psoriatic arthritis ([facility])   . PTSD (post-traumatic stress disorder)   . Secondary hyperparathyroidism ([facility])   . Tardive dyskinesia     Past Surgical History:  Procedure Laterality Date  . ABDOMINAL HYSTERECTOMY  1987  . CHOLECYSTECTOMY  1979  . DILATION AND CURETTAGE OF UTERUS  1973  . MASTECTOMY Bilateral 09/19/2014  . TONSILLECTOMY  1970  . URETERAL REIMPLANTION Bilateral 1974    There were no vitals filed for this  visit.  Subjective Assessment - 08/17/19 1322    Subjective  Was exhausted over the weekend, seeing her grandkids. Was able to fix a big breakfast without much help. Used strategies she learned in speech therapy. Has been doing her exercises.    Patient is accompained by:  Family member    Pertinent History  PMH:hypothyroidism, bipolar 1 disorder, psoriatic arthritis, Parkinsonism (medication-induced), COVID-19 positive on 04/03/2019    Patient Stated Goals  Pt's goals for therapy are to improve strength and balance.    Currently in Pain?  Yes   "arthritic pain in low back and hips"            Therapeutic Activity -reviewed floor transfers, 1st rep performing sit > stand and turning to go down into tall kneeling to face mat table, from tall kneeling back to half kneel and pushing up with BLE and BUE to come to stand, turning around and then sitting back down on mat, therapist providing min A.   2nd rep, pt performing towards mat table, from tall kneel and then going to side sitting then to sidelying on the floor. Reviewed with pt using teach back methods bout steps to take after a fall. Pt able to verbalize understanding of first assessing if pt is in pain or hurt before attempting to move, and if clear then pt crawling   to a sturdy object to get BUE support to come to stand. From sidelying on floor when to side sitting and then quadraped, pt able to crawl to mat table to push up from half kneel with BUE support, min A. Discussed with pt's husband on how to assist at home using gait belt for safety.         Access Code: [email or web address] URL: [email or web address] Date: 08/17/2019 Prepared by:    Verbally reviewed and had pt demo final HEP (per pt request), with pt reporting feeling comfortable with all exercises.    Exercises Seated Hamstring Stretch - 1 x daily - 4-5 x weekly - 3 sets - 15 hold Seated March - 1 x daily - 4-5 x weekly - 3 sets - 10 reps Seated  Long Arc Quad - 1-2 x daily - 4-5 x weekly - 2 sets - 5 reps Seated Heel Toe Raises - 1 x daily - 4-5 x weekly - 2 sets - 10 reps Side Stepping with Counter Support - 1 x daily - 4 x weekly - 1 sets - 10 reps Alternating Step Taps with Counter Support - 1 x daily - 4 x weekly - 1 sets - 10 reps Lateral Weight Shift with Arm Raise - 1 x daily - 5 x weekly - 1-2 sets - 10 reps Staggered Stance Forward Backward Weight Shift with Counter Support - 1 x daily - 5 x weekly - 1-2 sets - 10 reps Standing Balance - Eyes Closed with Head Motions - 1 x daily - 5 x weekly - 3 sets - 5 reps Sit to Stand without Arm Support - 1-2 x daily - 4-5 x weekly - 2 sets - 5 reps            PT Education - 08/17/19 1414    Education Details  reviewed fall recovery and final HEP    Person(s) Educated  Patient;Spouse    Methods  Explanation;Demonstration    Comprehension  Verbalized understanding;Returned demonstration       PT Short Term Goals - 06/30/19 0912      PT SHORT TERM GOAL #1   Title  Pt will perform HEP with family supervision for improved strength, balance, and gait.  TARGET:  06/18/2019    Time  4    Period  Weeks    Status  Achieved      PT SHORT TERM GOAL #2   Title  Pt will improve TUG to less than or equal to 15 seconds for decreased fall risk.    Baseline  17.5 sec 06/30/2019    Time  4    Period  Weeks    Status  Not Met      PT SHORT TERM GOAL #3   Title  Berg Balance test to be assessed, with goal to be written as appropriate.    Baseline  scored 47/56 on 06/10/19 - LTG written as appropriate.    Time  4    Period  Weeks    Status  Achieved      PT SHORT TERM GOAL #4   Title  Pt will improve 5x sit<>stand to less than or equal to 18 seconds for improved functional strength.    Baseline  16.93 sec 06/30/2019    Time  4    Period  Weeks    Status  Achieved      PT SHORT TERM GOAL #5   Title  Pt/husband will verbalize understanding of fall prevention in home   environment.     Time  4    Period  Weeks    Status  Achieved        PT Long Term Goals - 08/17/19 1413      PT LONG TERM GOAL #1   Title  Pt will perform final progression of HEP with supervision of family for improved balance, strength, gait.  Updated TARGET 08/20/2019    Time  1    Period  Weeks    Status  Achieved      PT LONG TERM GOAL #2   Title  Pt will perform floor>stand transfer with minimal assistance, with UE support, for improved fall recovery.    Time  1    Period  Weeks    Status  Achieved      PT LONG TERM GOAL #3   Title  Pt will improve 5x sit<>stand to less than or equal to 15 seconds for improved functional strength.    Baseline  not assessed on 08/17/19    Time  1    Period  Weeks    Status  Deferred         PHYSICAL THERAPY DISCHARGE SUMMARY  Visits from Start of Care: 14  Current functional level related to goals / functional outcomes: See LTGs.   Remaining deficits: Impaired endurance,  impaired dynamic balance.    Education / Equipment: HEP   Plan: Patient agrees to discharge.  Patient goals were met. Patient is being discharged due to meeting the stated rehab goals.  ?????         Plan - 08/17/19 1420    Clinical Impression Statement  focus of today's skilled session was assessing remainder of pt's goals with finalizing HEP and reviewing floor transfers. Pt reports feeling comfortable with HEP and performing fall recovery. Pt subjectively reports feeling much better and is in agreement to discharge. Pt has met 2 out of 3 LTGs (PT did not check LTG today in regards to 5x sit <> stand) and pt will be discharged at this time.    PT Frequency  2x / week    PT Duration  Other (comment)   1 additional week, per recert 07/29/2019   PT Treatment/Interventions  ADLs/Self Care Home Management;DME Instruction;Neuromuscular re-education;Balance training;Therapeutic activities;Therapeutic exercise;Functional mobility training;Gait training;Patient/family education     PT Next Visit Plan  D/C    Consulted and Agree with Plan of Care  Patient;Family member/caregiver    Family Member Consulted  Husband       Patient will benefit from skilled therapeutic intervention in order to improve the following deficits and impairments:  Abnormal gait, Decreased balance, Decreased mobility, Decreased strength, Postural dysfunction, Other (comment)  Visit Diagnosis: Muscle weakness (generalized)  Other symptoms and signs involving the nervous system  Other lack of coordination  Other abnormalities of gait and mobility  Unsteadiness on feet     Problem List Patient Active Problem List   Diagnosis Date Noted  . Chronic diarrhea 05/05/2019  . Bipolar I disorder, most recent episode depressed ([facility])   . Transaminitis   . Essential hypertension   . Seizures ([facility])   . Encephalopathy 04/26/2019  . Leukocytosis 04/21/2019  . Thrombocytopenia ([facility]) 09/04/2018  . Vitamin D deficiency 09/04/2018  . Psoriatic arthritis ([facility]) 09/04/2018  . Tubular adenoma of colon 05/28/2017  . 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 Malignant neoplasm of overlapping sites of left breast in female, estrogen receptor positive ([facility]) 03/14/2016  . Chronic   kidney disease, stage III (moderate) 01/19/2016  . Tardive dyskinesia 10/18/2015  . History of breast cancer left 2016 12/27/2014  . Osteopenia determined by x-ray 10/31/2014  . Rosacea 05/21/2007  . Bipolar affective disorder, mixed ([facility]) 08/16/2004  . Acquired hypothyroidism 04/03/1996     N , PT, DPT  08/17/2019, 2:27 PM  [facility] [address] [place], [place], [address] Phone: [phone]   Fax:  [phone]  Name: Tiffany [name] MRN: [id number] Date of Birth: 12/11/1950   

## 2019-08-18 ENCOUNTER — Ambulatory Visit (INDEPENDENT_AMBULATORY_CARE_PROVIDER_SITE_OTHER): Payer: Medicare Other | Admitting: Psychology

## 2019-08-18 DIAGNOSIS — F341 Dysthymic disorder: Secondary | ICD-10-CM | POA: Diagnosis not present

## 2019-08-19 ENCOUNTER — Telehealth: Payer: Self-pay | Admitting: Family Medicine

## 2019-08-19 NOTE — Telephone Encounter (Signed)
Pt husband called stating pt scheduled bone density exam with [facility]. Pt was told that they need to bring a copy of the order. Pt is requesting for [facility] to print off order so they can come pick it up. Please advise.

## 2019-08-19 NOTE — Telephone Encounter (Signed)
Order has been printed and placed up front for pick up.

## 2019-08-20 ENCOUNTER — Ambulatory Visit (INDEPENDENT_AMBULATORY_CARE_PROVIDER_SITE_OTHER): Payer: Medicare Other | Admitting: Psychology

## 2019-08-20 DIAGNOSIS — F341 Dysthymic disorder: Secondary | ICD-10-CM

## 2019-08-24 ENCOUNTER — Other Ambulatory Visit: Payer: Self-pay

## 2019-08-24 ENCOUNTER — Ambulatory Visit (INDEPENDENT_AMBULATORY_CARE_PROVIDER_SITE_OTHER): Payer: Medicare Other | Admitting: Physician Assistant

## 2019-08-24 ENCOUNTER — Telehealth: Payer: Self-pay | Admitting: Family Medicine

## 2019-08-24 ENCOUNTER — Encounter: Payer: Self-pay | Admitting: Physician Assistant

## 2019-08-24 VITALS — BP 134/82 | HR 72 | Temp 98.6°F | Ht 64.0 in | Wt 208.0 lb

## 2019-08-24 DIAGNOSIS — L03211 Cellulitis of face: Secondary | ICD-10-CM

## 2019-08-24 MED ORDER — DOXYCYCLINE HYCLATE 100 MG PO TABS: 100.0000 mg | ORAL_TABLET | Freq: Two times a day (BID) | ORAL | 0 refills | Status: DC

## 2019-08-24 NOTE — Progress Notes (Signed)
Tiffany [name] is a 69 y.o. female here for a new problem.  I,Tiffany [name],acting as a Education administrator for [employer], PA.,have documented all relevant documentation on the behalf of Tiffany Coke, PA,as directed by  Tiffany Coke, PA while in the presence of Tiffany [name], Tiffany [name].  History of Present Illness:   Chief Complaint  Patient presents with  . eye infection    HPI   Eye infection Patient got bite by gnat 08/10/19. Started having some swelling and has moved down further in eye. She has been using mascara and now spreading to other eye as well. She has been putting ice on it to help with itching. When she talked to nurse they recommended she put some antibiotic ointment.  She has used today.    Denies: painful eye movements, changes in vision, unusual headache    Past Medical History:  Diagnosis Date  . Bipolar 1 disorder ([place])   . Breast cancer ([place])   . Chronic diarrhea    loose stools twice a day on average for years  . Chronic kidney disease (CKD), stage III (moderate)   . Depression 1987  . 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 1.4 cm  Grade I invasive lobular, ER and PR +/Her--, Ki 67 <5% Tried anastrozole for one month, but developed suicidal idea  . Parkinson's disease ([place]) 2012  . Psoriatic arthritis ([place])   . PTSD (post-traumatic stress disorder)   . Secondary hyperparathyroidism ([place])   . Tardive dyskinesia      Social History   Tobacco Use  . Smoking status: Never Smoker  . Smokeless tobacco: Never Used  Vaping Use  . Vaping Use: Never used  Substance Use Topics  . Alcohol use: Never  . Drug use: Never    Past Surgical History:  Procedure Laterality Date  . ABDOMINAL HYSTERECTOMY  1987  . CHOLECYSTECTOMY  1979  . DILATION AND CURETTAGE OF UTERUS  1973  . MASTECTOMY Bilateral 09/19/2014  . TONSILLECTOMY  1970  .  URETERAL REIMPLANTION Bilateral 1974    Family History  Problem Relation Age of Onset  . Cancer Mother   . Cancer Sister   . Stroke Maternal Grandfather   . Diabetes Paternal Grandfather   . Cancer Sister     Allergies  Allergen Reactions  . Aripiprazole Other (See Comments)    Parkinsonism     . Lactose Intolerance (Gi) Diarrhea  . Methotrexate Other (See Comments)    Hair loss, severe stomatitis    . Cefdinir Diarrhea    Other reaction(s): Diarrhea Yeast infection and fever; negative c diff  . Etanercept Other (See Comments)    Headaches    . Exemestane Other (See Comments)    Suicidal thoughts with medication    . Fluoxetine Other (See Comments)    Parkinsonism  . Methylprednisolone Sodium Succ Other (See Comments)    Agitated mania  . Epinephrine Palpitations    tachycardia   . Nitrofurantoin Nausea And Vomiting and Rash         Current Medications:   Current Outpatient Medications:  .  acetaminophen (TYLENOL) 500 MG tablet, Take 500 mg by mouth daily as needed for headache (pain). , Disp: , Rfl:  .  amLODipine (NORVASC) 2.5 MG tablet, Take 1 tablet (2.5 mg total) by mouth daily., Disp: 90 tablet, Rfl: 3 .  calcitRIOL (ROCALTROL) 0.25 MCG capsule, Take 0.25  mcg by mouth every Monday, Wednesday, and Friday. , Disp: , Rfl:  .  Certolizumab Pegol (CIMZIA [place]), Inject 200 mg into the skin every 30 (thirty) days., Disp: , Rfl:  .  Cholecalciferol 50 MCG (2000 UT) TABS, Take 1 tablet (2,000 Units total) by mouth See admin instructions. Take 1 tablet by mouth daily, Disp: 30 tablet, Rfl: 0 .  lamoTRIgine (LAMICTAL) 100 MG tablet, Take 1 tablet (100 mg total) by mouth at bedtime., Disp: 90 tablet, Rfl: 1 .  levETIRAcetam (KEPPRA) 250 MG tablet, TAKE 1 TABLET(250 MG) BY MOUTH TWICE DAILY, Disp: 180 tablet, Rfl: 3 .  levothyroxine (SYNTHROID) 112 MCG tablet, Take 1 tablet (112 mcg total) by mouth daily at 6 (six) AM., Disp: 90 tablet, Rfl: 3 .  loperamide (IMODIUM) 2  MG capsule, Take 1 capsule (2 mg total) by mouth as needed for diarrhea or loose stools., Disp: 30 capsule, Rfl: 0 .  LORazepam (ATIVAN) 1 MG tablet, Take 1 tablet (1 mg total) by mouth 2 (two) times daily., Disp: 60 tablet, Rfl: 2 .  doxycycline (VIBRA-TABS) 100 MG tablet, Take 1 tablet (100 mg total) by mouth 2 (two) times daily., Disp: 20 tablet, Rfl: 0 .  sodium bicarbonate 650 MG tablet, Take 1 tablet (650 mg total) by mouth 2 (two) times daily., Disp: 60 tablet, Rfl: 0   Review of Systems:   ROS  Negative unless otherwise specified per HPI.  Vitals:   Vitals:   08/24/19 1543  BP: 134/82  Pulse: 72  Temp: 98.6 F (37 C)  TempSrc: Temporal  SpO2: 96%  Weight: 208 lb (94.3 kg)  Height: 5\' 4"  (1.626 m)     Body mass index is 35.7 kg/m.  Physical Exam:   Physical Exam Vitals and nursing note reviewed.  Constitutional:      General: She is not in acute distress.    Appearance: She is well-developed. She is not ill-appearing or toxic-appearing.  Eyes:     General: Lids are normal. Vision grossly intact. No visual field deficit.    Extraocular Movements: Extraocular movements intact.     Comments: Lower eyelid swelling R >> L with slight erythema as well No obvious lesions No pain elicited with EOM   Cardiovascular:     Rate and Rhythm: Normal rate and regular rhythm.     Pulses: Normal pulses.     Heart sounds: Normal heart sounds, S1 normal and S2 normal.  Pulmonary:     Effort: Pulmonary effort is normal.     Breath sounds: Normal breath sounds.  Skin:    General: Skin is warm and dry.  Neurological:     Mental Status: She is alert.     GCS: GCS eye subscore is 4. GCS verbal subscore is 5. GCS motor subscore is 6.  Psychiatric:        Speech: Speech normal.        Behavior: Behavior normal. Behavior is cooperative.       Assessment and Plan:   Tiffany [name] was seen today for eye infection.  Diagnoses and all orders for this visit:  Cellulitis of  face  Other orders -     doxycycline (VIBRA-TABS) 100 MG tablet; Take 1 tablet (100 mg total) by mouth 2 (two) times daily.    Vitals stable; patient appears comfortable and without any distress. No red flags on exam. Discouraged further mascara/eye make-up use. Recommended cleaning towels/sheets regularly. Will start oral doxycycline. Reviewed red flags and worsening precautions. Recommend she follow-up  with [place] if she does not have improvement or go to the ER if she has any worsening or new symptoms. Patient verbalized understanding to plan.  . Reviewed expectations re: course of current medical issues. . Discussed self-management of symptoms. . Outlined signs and symptoms indicating need for more acute intervention. . Patient verbalized understanding and all questions were answered. . See orders for this visit as documented in the electronic medical record. . Patient received an After-Visit Summary.  CMA or LPN served as scribe during this visit. History, Physical, and Plan performed by medical provider. The above documentation has been reviewed and is accurate and complete.   Tiffany Coke, PA-C

## 2019-08-24 NOTE — Telephone Encounter (Signed)
Nurse Assessment Nurse: Thad Ranger RN, Denise Date/Time (Eastern Time): 08/24/2019 11:45:37 AM Confirm and document reason for call. If symptomatic, describe symptoms. ---Caller states she has an insect bite in the outter corner of her eye that has been there for 2 weeks. States bit by a flying fungus knat. She states it was red and swollen in the area of the bite. Now the redness and swelling is moving to her under eye area as well. Billey Chang is the provider and office location was verified. Has the patient had close contact with a person known or suspected to have the novel coronavirus illness OR traveled / lives in area with major community spread (including international travel) in the last 14 days from the onset of symptoms? * If Asymptomatic, screen for exposure and travel within the last 14 days. ---No Does the patient have any new or worsening symptoms? ---Yes Will a triage be completed? ---Yes Related visit to physician within the last 2 weeks? ---No Does the PT have any chronic conditions? (i.e. diabetes, asthma, this includes High risk factors for pregnancy, etc.) ---Yes List chronic conditions. ---Kidney dx, Scoriatic Arthritis Is this a behavioral health or substance abuse call? ---NoPLEASE NOTE: All timestamps contained within this report are represented as [place] Standard Time. CONFIDENTIALTY NOTICE: This fax transmission is intended only for the addressee. It contains information that is legally privileged, confidential or otherwise protected from use or disclosure. If you are not the intended recipient, you are strictly prohibited from reviewing, disclosing, copying using or disseminating any of this information or taking any action in reliance on or regarding this information. If you have received this fax in error, please notify us immediately by telephone so that we can arrange for its return to [place]. Phone: [phone], Toll-Free: [phone], Fax: [phone] Page:  2 of 2 Call Id: [id number] Guidelines Guideline Title Affirmed Question Affirmed Notes Nurse Date/Time Eilene Ghazi Time) Insect Bite [1] Red or very tender (to touch) area AND [2] started over 24 hours after the bite Carmon, RN, [facility] 08/24/2019 11:47:50 AM Disp. Time Eilene Ghazi Time) Disposition Final User 08/24/2019 11:50:27 AM See PCP within 24 Hours Yes Carmon, RN, Yevette Edwards Disagree/Comply Comply Caller Understands Yes PreDisposition Call Doctor Care Advice Given Per Guideline SEE PCP WITHIN 24 HOURS: ANTIBIOTIC OINTMENT: * Apply an antibiotic ointment (OTC). * Do this 4 times per day. PAIN MEDICINES: * ACETAMINOPHEN - REGULAR STRENGTH TYLENOL: Take 650 mg (two 325 mg pills) by mouth every 4 to 6 hours as needed. Each Regular Strength Tylenol pill has 325 mg of acetaminophen. The most you should take each day is 3,250 mg (10 pills a day). CALL BACK IF: * Fever occurs * You become worse. CARE ADVICE given per Insect Bite (Adult) guideline.

## 2019-08-24 NOTE — Patient Instructions (Addendum)
It was great to see you!  Start oral doxycycline.  [facility] is my eye doctor recommendation --> [address] [place], [place] [address]  Preseptal Cellulitis, Adult  Preseptal cellulitis is an infection of the eyelid and the tissues around the eye (periorbital area). The infection causes painful swelling and redness. This condition may also be called periorbital cellulitis. In most cases, the condition can be treated with antibiotic medicine at home. It is important to treat preseptal cellulitis right away so that it does not get worse. If it gets worse, it can spread to the eye socket and eye muscles (orbital cellulitis). Orbital cellulitis is a medical emergency. What are the causes? Preseptal cellulitis is most commonly caused by bacteria. In rare cases, it can be caused by a virus or fungus. The germs that cause preseptal cellulitis may come from:  A sinus infection that spreads near the eyes.  An injury near the eye, such as a scratch, animal bite, or insect bite.  A skin rash that becomes infected, such as eczema or poison ivy.  An infected pimple on the eyelid (stye).  Infection after eyelid surgery or injury. What increases the risk? You are more likely to develop this condition if:  You have a weakened disease-fighting system (immune system).  You have a medical condition that raises your risk for sinus infections, such as nasal polyps. What are the signs or symptoms? Symptoms of this condition usually develop suddenly. Symptoms may include:  Eyelids that are red and swollen and feel unusually hot.  Fever.  Difficulty opening the eye.  Headache.  Facial pain. How is this diagnosed? This condition may be diagnosed based on your symptoms, your medical history, and an eye exam. You may have tests, such as:  Blood tests.  CT scan.  MRI. How is this treated? This condition is treated with antibiotic medicines. These may be given by mouth (orally), through an IV,  or as a shot. In rare cases, you may need surgery to drain an infected area. Follow these instructions at home: Medicines  If you were prescribed an antibiotic to take at home, take it as told by your health care provider. Do not stop taking the antibiotic even if you start to feel better.  Take over-the-counter and prescription medicines only as told by your health care provider. Eye Care  Do not use eye drops without first getting approval from your health care provider.  Do not touch or rub your eye. If you wear contact lenses, do not wear them until your health care provider approves.  Keep the eye area clean and dry.  Wash the eye area with a clean washcloth, warm water, and baby shampoo or mild soap.  To help relieve discomfort, place a clean washcloth that is wet with warm water over your eye. Leave the washcloth on for a few minutes, then remove it. General instructions  Wash your hands with soap and water often. If soap and water are not available, use hand sanitizer.  Do not use any products that contain nicotine or tobacco, such as cigarettes and e-cigarettes. If you need help quitting, ask your health care provider.  Drink enough fluid to keep your urine pale yellow.  Ask your health care provider if it is safe for you to drive.  Stay up to date on your vaccinations.  Keep all follow-up visits as told by your health care provider. This includes any visits with an eye specialist (ophthalmologist) or dentist. This is  important. Get help right away if:  You have new symptoms.  Your symptoms get worse or do not get better with treatment.  You have a fever.  Your vision becomes blurry or gets worse in any way.  Your eye looks like it is sticking out or bulging out (proptosis).  You have trouble moving your eyes.  You have a severe headache.  You have neck stiffness or severe neck pain. Summary  Preseptal cellulitis is an infection of the eyelid and the tissues  around the eye.  Symptoms of preseptal cellulitis usually develop suddenly and include red and swollen eyelids, fever, difficulty opening the eye, headache, and facial pain.  This condition is treated with antibiotic medicines. Do not stop taking the antibiotic even if you start to feel better. This information is not intended to replace advice given to you by your health care provider. Make sure you discuss any questions you have with your health care provider. Document Revised: 02/07/2017 Document Reviewed: 12/18/2016 Elsevier Patient Education  2020 [place] care,  Inda Coke PA-C

## 2019-08-25 ENCOUNTER — Encounter: Payer: Self-pay | Admitting: Adult Health

## 2019-08-25 ENCOUNTER — Ambulatory Visit (INDEPENDENT_AMBULATORY_CARE_PROVIDER_SITE_OTHER): Payer: Medicare Other | Admitting: Psychology

## 2019-08-25 ENCOUNTER — Ambulatory Visit (INDEPENDENT_AMBULATORY_CARE_PROVIDER_SITE_OTHER): Payer: Medicare Other | Admitting: Adult Health

## 2019-08-25 DIAGNOSIS — F319 Bipolar disorder, unspecified: Secondary | ICD-10-CM

## 2019-08-25 DIAGNOSIS — F331 Major depressive disorder, recurrent, moderate: Secondary | ICD-10-CM

## 2019-08-25 DIAGNOSIS — F341 Dysthymic disorder: Secondary | ICD-10-CM

## 2019-08-25 DIAGNOSIS — F411 Generalized anxiety disorder: Secondary | ICD-10-CM | POA: Diagnosis not present

## 2019-08-25 DIAGNOSIS — F4310 Post-traumatic stress disorder, unspecified: Secondary | ICD-10-CM

## 2019-08-25 DIAGNOSIS — G4700 Insomnia, unspecified: Secondary | ICD-10-CM

## 2019-08-25 NOTE — Progress Notes (Signed)
Tiffany [name] [id number] [birth year] 69 y.o.  Subjective:   Patient ID:  Tiffany [name] is a 69 y.o. (DOB [birth year]) female.  Chief Complaint: No chief complaint on file.   HPI Tiffany [name] presents to the office today for follow-up of PTSD, insomnia,GAD, BPD 1.  Describes mood today as "ok". Pleasant. Mood symptoms - reports depression, anxiety, and irritability. Stating "I feel all the above. Over sensitized to everything. Some "hyper" agitation. Went to [employer] to get a card and got overstimulated and had to leave. Having to "push" myself. Is wondering if she should change the dosing of Ativan. Feels like a lack of sleep is "fueling symptoms". Having difficulties "coping" with things. Decreased interest and motivation. Taking medications as prescribed.  Energy levels "not up to par". Active, exercising regularly. Walking in the evenings. Has finished with P/T rehab.  Enjoys some usual interests and activities. Married. Lives with husband of 66 years and their daughter. Getting outside more. Daughter and grandchildren in [place] visited recently. Talking to family and friends.  Appetite adequate. Weight stable.   Sleeping difficulties. Ativan not helping as much for sleep. Averages 4 hours. Napping sometimes during the day.  Focus and concentration "lacks". Completing tasks. Managing some aspects of household - cleaning, cooking, laundry.  Denies SI or HI. Denies AH or VH.   Previous medications: Celexa, Zyprexa, Tegretol, Depakote, Serzone, Topamax, Seroquel, Effexor, Lexapro, Desipramine, Neurontin, Abilify, Geodon, Propanolol, Cymbalta, Cogentin, Trihexyphenadyl, Simet, Provigil, Selegiline, Requip, Amantadine, Prozac, Mirapex, Azilect, Metoclopramide, Baclofen, Artane, Namenda, Latuda.   GAD-7     Office Visit from 05/14/2019 in [facility]  Total GAD-7 Score 21    Mini-Mental     Office Visit from 06/07/2019 in [facility]  Total Score (max 30 points  ) 29    PHQ2-9     Office Visit from 08/24/2019 in [facility] Visit from 05/14/2019 in [facility]  PHQ-2 Total Score 0 1  PHQ-9 Total Score 4 8       Review of Systems:  Review of Systems  Musculoskeletal: Negative for gait problem.  Neurological: Negative for tremors.  Psychiatric/Behavioral:       Please refer to HPI    Medications: I have reviewed the patient's current medications.  Current Outpatient Medications  Medication Sig Dispense Refill  . acetaminophen (TYLENOL) 500 MG tablet Take 500 mg by mouth daily as needed for headache (pain).     Marland Kitchen amLODipine (NORVASC) 2.5 MG tablet Take 1 tablet (2.5 mg total) by mouth daily. 90 tablet 3  . calcitRIOL (ROCALTROL) 0.25 MCG capsule Take 0.25 mcg by mouth every Monday, Wednesday, and Friday.     . Certolizumab Pegol (CIMZIA [place]) Inject 200 mg into the skin every 30 (thirty) days.    . Cholecalciferol 50 MCG (2000 UT) TABS Take 1 tablet (2,000 Units total) by mouth See admin instructions. Take 1 tablet by mouth daily 30 tablet 0  . doxycycline (VIBRA-TABS) 100 MG tablet Take 1 tablet (100 mg total) by mouth 2 (two) times daily. 20 tablet 0  . lamoTRIgine (LAMICTAL) 100 MG tablet Take 1 tablet (100 mg total) by mouth at bedtime. 90 tablet 1  . levETIRAcetam (KEPPRA) 250 MG tablet TAKE 1 TABLET(250 MG) BY MOUTH TWICE DAILY 180 tablet 3  . levothyroxine (SYNTHROID) 112 MCG tablet Take 1 tablet (112 mcg total) by mouth daily at 6 (six) AM. 90 tablet 3  . loperamide (IMODIUM) 2 MG capsule Take 1 capsule (2 mg  total) by mouth as needed for diarrhea or loose stools. 30 capsule 0  . LORazepam (ATIVAN) 1 MG tablet Take 1 tablet (1 mg total) by mouth 2 (two) times daily. 60 tablet 2  . sodium bicarbonate 650 MG tablet Take 1 tablet (650 mg total) by mouth 2 (two) times daily. 60 tablet 0   No current facility-administered medications for this visit.    Medication Side Effects:  None  Allergies:  Allergies  Allergen Reactions  . Aripiprazole Other (See Comments)    Parkinsonism     . Lactose Intolerance (Gi) Diarrhea  . Methotrexate Other (See Comments)    Hair loss, severe stomatitis    . Cefdinir Diarrhea    Other reaction(s): Diarrhea Yeast infection and fever; negative c diff  . Etanercept Other (See Comments)    Headaches    . Exemestane Other (See Comments)    Suicidal thoughts with medication    . Fluoxetine Other (See Comments)    Parkinsonism  . Methylprednisolone Sodium Succ Other (See Comments)    Agitated mania  . Epinephrine Palpitations    tachycardia   . Nitrofurantoin Nausea And Vomiting and Rash         Past Medical History:  Diagnosis Date  . Bipolar 1 disorder ([place])   . Breast cancer ([place])   . Chronic diarrhea    loose stools twice a day on average for years  . Chronic kidney disease (CKD), stage III (moderate)   . Depression 1987  . 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 1.4 cm  Grade I invasive lobular, ER and PR +/Her--, Ki 67 <5% Tried anastrozole for one month, but developed suicidal idea  . Parkinson's disease ([place]) 2012  . Psoriatic arthritis ([place])   . PTSD (post-traumatic stress disorder)   . Secondary hyperparathyroidism ([place])   . Tardive dyskinesia     Family History  Problem Relation Age of Onset  . Cancer Mother   . Cancer Sister   . Stroke Maternal Grandfather   . Diabetes Paternal Grandfather   . Cancer Sister     Social History   Socioeconomic History  . Marital status: Married    Spouse name: Not on file  . Number of children: Not on file  . Years of education: Not on file  . Highest education level: Not on file  Occupational History  . Not on file  Tobacco Use  . Smoking status: Never Smoker  . Smokeless tobacco: Never Used  Vaping Use  . Vaping Use:  Never used  Substance and Sexual Activity  . Alcohol use: Never  . Drug use: Never  . Sexual activity: Not Currently  Other Topics Concern  . Not on file  Social History Narrative   Moved to area from [place] 08/2018   Lives one story home   Right handed.   Social Determinants of Health   Financial Resource Strain:   . Difficulty of Paying Living Expenses:   Food Insecurity:   . Worried About Charity fundraiser in the Last Year:   . Arboriculturist in the Last Year:   Transportation Needs:   . Film/video editor (Medical):   Marland Kitchen Lack of Transportation (Non-Medical):   Physical Activity:   . Days of Exercise per Week:   . Minutes of Exercise per Session:   Stress:   . Feeling of  Stress :   Social Connections:   . Frequency of Communication with Friends and Family:   . Frequency of Social Gatherings with Friends and Family:   . Attends Religious Services:   . Active Member of Clubs or Organizations:   . Attends Archivist Meetings:   Marland Kitchen Marital Status:   Intimate Partner Violence:   . Fear of Current or Ex-Partner:   . Emotionally Abused:   Marland Kitchen Physically Abused:   . Sexually Abused:     Past Medical History, Surgical history, Social history, and Family history were reviewed and updated as appropriate.   Please see review of systems for further details on the patient's review from today.   Objective:   Physical Exam:  There were no vitals taken for this visit.  Physical Exam Constitutional:      General: She is not in acute distress. Musculoskeletal:        General: No deformity.  Neurological:     Mental Status: She is alert and oriented to person, place, and time.     Coordination: Coordination normal.  Psychiatric:        Attention and Perception: Attention and perception normal. She does not perceive auditory or visual hallucinations.        Mood and Affect: Mood is anxious and depressed. Affect is not labile, blunt, angry or inappropriate.         Speech: Speech normal.        Behavior: Behavior normal.        Thought Content: Thought content normal. Thought content is not paranoid or delusional. Thought content does not include homicidal or suicidal ideation. Thought content does not include homicidal or suicidal plan.        Cognition and Memory: Cognition and memory normal.        Judgment: Judgment normal.     Comments: Insight intact     Lab Review:     Component Value Date/Time   NA 138 06/08/2019 0000   K 4.6 06/08/2019 0000   CL 105 06/08/2019 0000   CO2 26 (A) 06/08/2019 0000   GLUCOSE 98 05/03/2019 0553   BUN 29 (A) 06/08/2019 0000   CREATININE 2.0 (A) 06/08/2019 0000   CREATININE 1.86 (H) 05/03/2019 0553   CALCIUM 9.7 06/08/2019 0000   PROT 5.9 (L) 04/30/2019 0519   ALBUMIN 4.0 06/08/2019 0000   AST 34 04/30/2019 0519   ALT 57 (H) 04/30/2019 0519   ALKPHOS 93 04/30/2019 0519   BILITOT 0.4 04/30/2019 0519   GFRNONAA 24 06/08/2019 0000   GFRAA 28 06/08/2019 0000       Component Value Date/Time   WBC 8.3 05/03/2019 0553   RBC 3.91 05/03/2019 0553   HGB 11.7 (L) 05/03/2019 0553   HCT 37.8 05/03/2019 0553   PLT 177 05/03/2019 0553   MCV 96.7 05/03/2019 0553   MCH 29.9 05/03/2019 0553   MCHC 31.0 05/03/2019 0553   RDW 14.3 05/03/2019 0553   LYMPHSABS 2.5 04/27/2019 0544   MONOABS 0.5 04/27/2019 0544   EOSABS 0.2 04/27/2019 0544   BASOSABS 0.0 04/27/2019 0544    Lithium Lvl  Date Value Ref Range Status  04/21/2019 0.79 0.60 - 1.20 mmol/L Final    Comment:    Performed at [facility], [place] [address] [place], [place] [address]     No results found for: PHENYTOIN, PHENOBARB, VALPROATE, CBMZ   .res Assessment: Plan:    Plan:  Lamictal 100mg  at hs Lorazepam 1mg  BID  for anxiety  Also taking Keppra 250mg  BID for seizures  Discussed Benadryl for sleep - may take 25mg  to 50mg  daily  Therapist - [name] - seeing every 2 weeks  RTC 4 weeks  Discussed potential benefits, risk,  and side effects of benzodiazepines to include potential risk of tolerance and dependence, as well as possible drowsiness.  Advised patient not to drive if experiencing drowsiness and to take lowest possible effective dose to minimize risk of dependence and tolerance.  Counseled patient regarding potential benefits, risks, and side effects of Lamictal to include potential risk of Stevens-Johnson syndrome. Advised patient to stop taking Lamictal and contact office immediately if rash develops and to seek urgent medical attention if rash is severe and/or spreading quickly.   Diagnoses and all orders for this visit:  Major depressive disorder, recurrent episode, moderate (HCC)  Bipolar I disorder (HCC)  PTSD (post-traumatic stress disorder)  Generalized anxiety disorder  Insomnia, unspecified type     Please see After Visit Summary for patient specific instructions.  Future Appointments  Date Time Provider [place]  08/25/2019  1:00 PM Cottle, Bambi G, LCSW [facility] None  09/01/2019  1:00 PM Cottle, Bambi G, LCSW [facility] None  09/03/2019 10:30 AM Sheffield, Vida Roller R, PA-C [facility]  09/08/2019  1:00 PM Cottle, Bambi G, LCSW [facility] None  09/15/2019  1:00 PM Cottle, Bambi G, LCSW [facility] None  09/22/2019  1:00 PM Cottle, Bambi G, LCSW [facility] None  09/24/2019  1:00 PM Leamon Arnt, MD [facility]  12/14/2019 11:30 AM Cameron Sprang, MD [facility] None    No orders of the defined types were placed in this encounter.   -------------------------------

## 2019-08-26 ENCOUNTER — Telehealth: Payer: Self-pay | Admitting: *Deleted

## 2019-08-26 LAB — HM DEXA SCAN: HM Dexa Scan: NORMAL

## 2019-08-26 NOTE — Telephone Encounter (Signed)
Pt called back, told her I was calling to see how she was doing and follow up on eye swelling. Pt said she is doing okay, swelling still present but no worse, little pressure and red streak still under right eye. Denies blurred vision. Told her I will let Aldona Bar know she is out of the office today and will be back tomorrow. Pt verbalized understanding.

## 2019-08-26 NOTE — Telephone Encounter (Signed)
Left message on voicemail to call office.  

## 2019-08-26 NOTE — Telephone Encounter (Signed)
-----   Message from [place], [place] sent at 08/24/2019  4:32 PM EDT ----- Regarding: please call patient Can we call patient on Wednesday to see how she is doing? I would like to follow-up on her eye swelling.  Low threshold to send her to the ER.

## 2019-08-27 NOTE — Telephone Encounter (Signed)
See message.

## 2019-08-27 NOTE — Telephone Encounter (Signed)
Patient notified and voices understanding 

## 2019-08-27 NOTE — Telephone Encounter (Signed)
Patient stated her I is getting better with antibiotics not sure why she needs to see a Eye Doctor due to symptoms improving.Please advise

## 2019-08-27 NOTE — Telephone Encounter (Signed)
I must have misunderstood message -- I thought that the pressure she was having behind her eye was new. If she feels as though she is improving, we do not need to refer. Thanks!

## 2019-08-27 NOTE — Telephone Encounter (Signed)
Please call and see if patient would like to see an eye doctor today -- I would like for her to be evaluated.  If so -- please put in urgent referral to [facility] and let [facility] know.

## 2019-08-30 ENCOUNTER — Encounter: Payer: Self-pay | Admitting: Family Medicine

## 2019-08-30 ENCOUNTER — Telehealth: Payer: Self-pay | Admitting: Adult Health

## 2019-08-30 ENCOUNTER — Ambulatory Visit (INDEPENDENT_AMBULATORY_CARE_PROVIDER_SITE_OTHER): Payer: Medicare Other | Admitting: Psychology

## 2019-08-30 DIAGNOSIS — F341 Dysthymic disorder: Secondary | ICD-10-CM

## 2019-08-30 NOTE — Telephone Encounter (Signed)
Returned call to patient. Sleeping better. Depressed over the weekend. Feeling more manic this evening. Wanting to wait until tomorrow to make any changes.

## 2019-08-30 NOTE — Telephone Encounter (Signed)
Noted  

## 2019-08-30 NOTE — Telephone Encounter (Addendum)
Pt called to report she is sleeping much better but depressed. [phone] call back #.     Apt 7/14

## 2019-08-31 ENCOUNTER — Telehealth: Payer: Self-pay | Admitting: Adult Health

## 2019-08-31 NOTE — Telephone Encounter (Signed)
Tiffany [name] called back this morning as follow up from your conversation with her yesterday.  She slept 4 hours last night.  But even though she only got 4 hrs of sleep, she is still feeling more stable today.  Not manic or suicidal.  Kind of somewhere in between.

## 2019-08-31 NOTE — Telephone Encounter (Signed)
Noted  

## 2019-09-01 ENCOUNTER — Ambulatory Visit (INDEPENDENT_AMBULATORY_CARE_PROVIDER_SITE_OTHER): Payer: Medicare Other | Admitting: Psychology

## 2019-09-01 DIAGNOSIS — F341 Dysthymic disorder: Secondary | ICD-10-CM

## 2019-09-02 ENCOUNTER — Ambulatory Visit: Payer: Medicare Other | Admitting: Adult Health

## 2019-09-03 ENCOUNTER — Ambulatory Visit: Payer: Medicare Other | Admitting: Physician Assistant

## 2019-09-04 ENCOUNTER — Emergency Department (HOSPITAL_COMMUNITY)
Admission: EM | Admit: 2019-09-04 | Discharge: 2019-09-05 | Disposition: A | Discharge status: Home / Self Care | Payer: Medicare Other | Attending: Emergency Medicine | Admitting: Emergency Medicine

## 2019-09-04 ENCOUNTER — Other Ambulatory Visit: Payer: Self-pay

## 2019-09-04 DIAGNOSIS — N183 Chronic kidney disease, stage 3 unspecified: Secondary | ICD-10-CM | POA: Insufficient documentation

## 2019-09-04 DIAGNOSIS — E039 Hypothyroidism, unspecified: Secondary | ICD-10-CM | POA: Diagnosis not present

## 2019-09-04 DIAGNOSIS — Z853 Personal history of malignant neoplasm of breast: Secondary | ICD-10-CM | POA: Diagnosis not present

## 2019-09-04 DIAGNOSIS — I129 Hypertensive chronic kidney disease with stage 1 through stage 4 chronic kidney disease, or unspecified chronic kidney disease: Secondary | ICD-10-CM | POA: Insufficient documentation

## 2019-09-04 DIAGNOSIS — Z20822 Contact with and (suspected) exposure to covid-19: Secondary | ICD-10-CM | POA: Diagnosis not present

## 2019-09-04 DIAGNOSIS — R45851 Suicidal ideations: Secondary | ICD-10-CM | POA: Diagnosis not present

## 2019-09-04 DIAGNOSIS — G20 Parkinson's disease: Secondary | ICD-10-CM | POA: Insufficient documentation

## 2019-09-04 DIAGNOSIS — Z79899 Other long term (current) drug therapy: Secondary | ICD-10-CM | POA: Diagnosis not present

## 2019-09-04 DIAGNOSIS — F329 Major depressive disorder, single episode, unspecified: Secondary | ICD-10-CM | POA: Diagnosis present

## 2019-09-04 DIAGNOSIS — F3130 Bipolar disorder, current episode depressed, mild or moderate severity, unspecified: Secondary | ICD-10-CM

## 2019-09-04 DIAGNOSIS — F319 Bipolar disorder, unspecified: Secondary | ICD-10-CM | POA: Diagnosis not present

## 2019-09-04 DIAGNOSIS — F419 Anxiety disorder, unspecified: Secondary | ICD-10-CM | POA: Insufficient documentation

## 2019-09-04 LAB — COMPREHENSIVE METABOLIC PANEL
ALT: 15 U/L (ref 0–44)
AST: 15 U/L (ref 15–41)
Albumin: 4.3 g/dL (ref 3.5–5.0)
Alkaline Phosphatase: 131 U/L — ABNORMAL HIGH (ref 38–126)
Anion gap: 9 (ref 5–15)
BUN: 36 mg/dL — ABNORMAL HIGH (ref 8–23)
CO2: 24 mmol/L (ref 22–32)
Calcium: 9.3 mg/dL (ref 8.9–10.3)
Chloride: 109 mmol/L (ref 98–111)
Creatinine, Ser: 1.81 mg/dL — ABNORMAL HIGH (ref 0.44–1.00)
GFR calc Af Amer: 32 mL/min — ABNORMAL LOW (ref >60)
GFR calc non Af Amer: 28 mL/min — ABNORMAL LOW (ref >60)
Glucose, Bld: 101 mg/dL — ABNORMAL HIGH (ref 70–99)
Potassium: 4.3 mmol/L (ref 3.5–5.1)
Sodium: 142 mmol/L (ref 135–145)
Total Bilirubin: 0.7 mg/dL (ref 0.3–1.2)
Total Protein: 8 g/dL (ref 6.5–8.1)

## 2019-09-04 LAB — CBC
HCT: 46.5 % — ABNORMAL HIGH (ref 36.0–46.0)
Hemoglobin: 14.6 g/dL (ref 12.0–15.0)
MCH: 29.9 pg (ref 26.0–34.0)
MCHC: 31.4 g/dL (ref 30.0–36.0)
MCV: 95.3 fL (ref 80.0–100.0)
Platelets: 258 10*3/uL (ref 150–400)
RBC: 4.88 MIL/uL (ref 3.87–5.11)
RDW: 13.2 % (ref 11.5–15.5)
WBC: 11 10*3/uL — ABNORMAL HIGH (ref 4.0–10.5)
nRBC: 0 % (ref 0.0–0.2)

## 2019-09-04 LAB — ACETAMINOPHEN LEVEL: Acetaminophen (Tylenol), Serum: <10 ug/mL — ABNORMAL LOW (ref 10–30)

## 2019-09-04 LAB — SALICYLATE LEVEL: Salicylate Lvl: <7 mg/dL — ABNORMAL LOW (ref 7.0–30.0)

## 2019-09-04 LAB — RAPID URINE DRUG SCREEN, HOSP PERFORMED
Amphetamines: NOT DETECTED
Barbiturates: NOT DETECTED
Benzodiazepines: NOT DETECTED
Cocaine: NOT DETECTED
Opiates: NOT DETECTED
Tetrahydrocannabinol: NOT DETECTED

## 2019-09-04 LAB — ETHANOL: Alcohol, Ethyl (B): <10 mg/dL (ref <10)

## 2019-09-04 MED ORDER — SODIUM BICARBONATE 650 MG PO TABS
650.0000 mg | ORAL_TABLET | Freq: Two times a day (BID) | ORAL | Status: DC
  Administered 2019-09-04: 650 mg via ORAL
  Filled 2019-09-04 (×2): qty 1

## 2019-09-04 MED ORDER — LEVETIRACETAM 250 MG PO TABS
250.0000 mg | ORAL_TABLET | Freq: Two times a day (BID) | ORAL | Status: DC
  Administered 2019-09-04: 250 mg via ORAL
  Filled 2019-09-04: qty 1

## 2019-09-04 MED ORDER — LAMOTRIGINE 100 MG PO TABS
100.0000 mg | ORAL_TABLET | Freq: Every day | ORAL | Status: DC
  Administered 2019-09-04: 100 mg via ORAL
  Filled 2019-09-04: qty 1

## 2019-09-04 MED ORDER — AMLODIPINE BESYLATE 5 MG PO TABS
2.5000 mg | ORAL_TABLET | Freq: Every day | ORAL | Status: DC
  Filled 2019-09-04: qty 1

## 2019-09-04 MED ORDER — LORAZEPAM 1 MG PO TABS
1.0000 mg | ORAL_TABLET | Freq: Two times a day (BID) | ORAL | Status: DC
  Administered 2019-09-04: 1 mg via ORAL
  Filled 2019-09-04 (×2): qty 1

## 2019-09-04 MED ORDER — LEVOTHYROXINE SODIUM 112 MCG PO TABS
112.0000 ug | ORAL_TABLET | Freq: Every day | ORAL | Status: DC
  Administered 2019-09-05: 112 ug via ORAL
  Filled 2019-09-04: qty 1

## 2019-09-04 NOTE — ED Notes (Signed)
Patient reports feeling drowsy due to taking ativan before arrival.

## 2019-09-04 NOTE — ED Provider Notes (Signed)
[facility] DEPT Provider Note   CSN: [id number] Arrival date & time: 09/04/19  1756     History Chief Complaint  Patient presents with  . Suicidal  . Depression    Tiffany [name] is a 69 y.o. female.  HPI     Tiffany [name] is a 69 y.o. female, with a history of bipolar, CKD stage III, Parkinson's, depression, presenting to the ED with increased anxiety, depression, and suicidal ideations. Patient states she had increased stress over the last couple weeks with disappointing and stressful visits with her adult children. She states, "I was doing better and came out of the fog I had been in for the last 40 years, but then everything came crashing down all at once.  I had crushing thoughts about where my life had gone, who was taking care of the kids while I was in my fog, and then after the stressful visits, I just couldn't take it anymore. I decided I probably wouldn't last the night if I had to stay at home without help."  Plan: Patient states she plans to overdose on her medications at home if she does not get any help for her quickly worsened, severe depression and thoughts of suicide that are dominating her mind.  Support system: Patient has support with her husband.  Previous Attempts: Denies previous suicide attempts, however, patient states she does have a distant history of self-harm in the form of cutting.  Drug/alcohol use: Denies use of alcohol or illicit drugs.  Medication compliance: States she has been compliant with her prescribed medications.  Medication changes: Most of her medications were removed from her regimen during her psychiatric admission February 2021.  Her Lamictal dose was also reduced.  During that admission Keppra was also added due to an EEG showing evidence of seizures.  Physical complaints: Denies any physical complaints at this time.          Past Medical History:  Diagnosis Date  . Bipolar 1 disorder ([place])   .  Breast cancer ([place])   . Chronic diarrhea    loose stools twice a day on average for years  . Chronic kidney disease (CKD), stage III (moderate)   . Depression 1987  . Malignant neoplasm of overlapping sites of left breast in female, estrogen receptor positive ([place]) 03/14/2016   Dx in 09/2014, s/p bilateral mastectomies and ALND, 0/10 LN. 1.4 cm  Grade I invasive lobular, ER and PR +/Her--, Ki 67 <5% Tried anastrozole for one month, but developed suicidal idea  . Parkinson's disease ([place]) 2012  . Psoriatic arthritis ([place])   . PTSD (post-traumatic stress disorder)   . Secondary hyperparathyroidism ([place])   . Tardive dyskinesia     Patient Active Problem List   Diagnosis Date Noted  . Chronic diarrhea 05/05/2019  . Bipolar I disorder, most recent episode depressed ([place])   . Transaminitis   . Essential hypertension   . Seizures ([place])   . Encephalopathy 04/26/2019  . Leukocytosis 04/21/2019  . Thrombocytopenia ([place]) 09/04/2018  . Vitamin D deficiency 09/04/2018  . Psoriatic arthritis ([place]) 09/04/2018  . Tubular adenoma of colon 05/28/2017  . 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 Malignant neoplasm of overlapping sites of left breast in female, estrogen receptor positive ([place]) 03/14/2016  . Chronic kidney disease, stage III (moderate) 01/19/2016  . Tardive dyskinesia 10/18/2015  . History of breast cancer left 2016 12/27/2014  . Osteopenia determined by x-ray 10/31/2014  . Rosacea 05/21/2007  .  Bipolar affective disorder, mixed ([place]) 08/16/2004  . Acquired hypothyroidism 04/03/1996    Past Surgical History:  Procedure Laterality Date  . ABDOMINAL HYSTERECTOMY  1987  . CHOLECYSTECTOMY  1979  . DILATION AND CURETTAGE OF UTERUS  1973  . MASTECTOMY Bilateral 09/19/2014  . TONSILLECTOMY  1970  . URETERAL REIMPLANTION Bilateral 1974     OB History   No obstetric history on file.     Family History  Problem Relation Age of Onset  .  Cancer Mother   . Cancer Sister   . Stroke Maternal Grandfather   . Diabetes Paternal Grandfather   . Cancer Sister     Social History   Tobacco Use  . Smoking status: Never Smoker  . Smokeless tobacco: Never Used  Vaping Use  . Vaping Use: Never used  Substance Use Topics  . Alcohol use: Never  . Drug use: Never    Home Medications Prior to Admission medications   Medication Sig Start Date End Date Taking? Authorizing Provider  acetaminophen (TYLENOL) 500 MG tablet Take 500 mg by mouth daily as needed for headache (pain).     [provider]  amLODipine (NORVASC) 2.5 MG tablet Take 1 tablet (2.5 mg total) by mouth daily. 05/31/19   Leamon Arnt, MD  calcitRIOL (ROCALTROL) 0.25 MCG capsule Take 0.25 mcg by mouth every Monday, Wednesday, and Friday.  06/18/18   [provider]  Certolizumab Pegol (CIMZIA [place]) Inject 200 mg into the skin every 30 (thirty) days.    [provider]  Cholecalciferol 50 MCG (2000 UT) TABS Take 1 tablet (2,000 Units total) by mouth See admin instructions. Take 1 tablet by mouth daily 05/05/19   Love, Ivan Anchors, PA-C  doxycycline (VIBRA-TABS) 100 MG tablet Take 1 tablet (100 mg total) by mouth 2 (two) times daily. 08/24/19   Inda Coke, PA  lamoTRIgine (LAMICTAL) 100 MG tablet Take 1 tablet (100 mg total) by mouth at bedtime. 07/07/19   Mozingo, Berdie Ogren, NP  levETIRAcetam (KEPPRA) 250 MG tablet TAKE 1 TABLET(250 MG) BY MOUTH TWICE DAILY 06/07/19   Cameron Sprang, MD  levothyroxine (SYNTHROID) 112 MCG tablet Take 1 tablet (112 mcg total) by mouth daily at 6 (six) AM. 05/31/19   Leamon Arnt, MD  loperamide (IMODIUM) 2 MG capsule Take 1 capsule (2 mg total) by mouth as needed for diarrhea or loose stools. 04/26/19   Regalado, Belkys A, MD  LORazepam (ATIVAN) 1 MG tablet Take 1 tablet (1 mg total) by mouth 2 (two) times daily. 07/07/19   Mozingo, Berdie Ogren, NP  sodium bicarbonate 650 MG tablet Take 1 tablet (650 mg  total) by mouth 2 (two) times daily. 05/05/19   Love, Ivan Anchors, PA-C    Allergies    Aripiprazole, Lactose intolerance (gi), Methotrexate, Cefdinir, Etanercept, Exemestane, Fluoxetine, Methylprednisolone sodium succ, Epinephrine, and Nitrofurantoin  Review of Systems   Review of Systems  Constitutional: Negative for chills, diaphoresis and fever.  Respiratory: Negative for cough and shortness of breath.   Cardiovascular: Negative for chest pain.  Gastrointestinal: Negative for abdominal pain, diarrhea, nausea and vomiting.  Neurological: Negative for weakness and numbness.  Psychiatric/Behavioral: Positive for dysphoric mood and suicidal ideas. Negative for hallucinations. The patient is nervous/anxious.   All other systems reviewed and are negative.   Physical Exam Updated Vital Signs BP (!) 161/88 (BP Location: Right Arm)   Pulse 76   Temp 97.8 F (36.6 C) (Oral)   Resp 18   Ht 5\' 4"  (  1.626 m)   Wt 94.3 kg   SpO2 99%   BMI 35.70 kg/m   Physical Exam Vitals and nursing note reviewed.  Constitutional:      General: She is not in acute distress.    Appearance: She is well-developed. She is not diaphoretic.  HENT:     Head: Normocephalic and atraumatic.     Mouth/Throat:     Mouth: Mucous membranes are moist.     Pharynx: Oropharynx is clear.  Eyes:     Conjunctiva/sclera: Conjunctivae normal.  Cardiovascular:     Rate and Rhythm: Normal rate and regular rhythm.     Pulses: Normal pulses.          Radial pulses are 2+ on the right side and 2+ on the left side.     Heart sounds: Normal heart sounds.  Pulmonary:     Effort: Pulmonary effort is normal. No respiratory distress.     Breath sounds: Normal breath sounds.  Abdominal:     Palpations: Abdomen is soft.     Tenderness: There is no abdominal tenderness. There is no guarding.  Musculoskeletal:     Cervical back: Neck supple.  Lymphadenopathy:     Cervical: No cervical adenopathy.  Skin:    General: Skin is  warm and dry.  Neurological:     Mental Status: She is alert.  Psychiatric:        Mood and Affect: Mood is anxious. Affect is labile and tearful.        Speech: Speech normal.        Behavior: Behavior normal.     ED Results / Procedures / Treatments   Labs (all labs ordered are listed, but only abnormal results are displayed) Labs Reviewed  COMPREHENSIVE METABOLIC PANEL - Abnormal; Notable for the following components:      Result Value   Glucose, Bld 101 (*)    BUN 36 (*)    Creatinine, Ser 1.81 (*)    Alkaline Phosphatase 131 (*)    GFR calc non Af Amer 28 (*)    GFR calc Af Amer 32 (*)    All other components within normal limits  SALICYLATE LEVEL - Abnormal; Notable for the following components:   Salicylate Lvl <8.4 (*)    All other components within normal limits  ACETAMINOPHEN LEVEL - Abnormal; Notable for the following components:   Acetaminophen (Tylenol), Serum <10 (*)    All other components within normal limits  CBC - Abnormal; Notable for the following components:   WBC 11.0 (*)    HCT 46.5 (*)    All other components within normal limits  SARS CORONAVIRUS 2 BY RT PCR (HOSPITAL ORDER, [facility])  ETHANOL  RAPID URINE DRUG SCREEN, HOSP PERFORMED  LAMOTRIGINE LEVEL  LEVETIRACETAM LEVEL    BUN  Date Value Ref Range Status  09/04/2019 36 (H) 8 - 23 mg/dL Final  06/08/2019 29 (A) 4 - 21 Final  05/03/2019 13 8 - 23 mg/dL Final  04/27/2019 10 8 - 23 mg/dL Final  04/26/2019 9 8 - 23 mg/dL Final  09/29/2018 23 (A) 4 - 21 Final   Creatinine  Date Value Ref Range Status  06/08/2019 2.0 (A) 0.5 - 1.1 Final   Creatinine, Ser  Date Value Ref Range Status  09/04/2019 1.81 (H) 0.44 - 1.00 mg/dL Final  05/03/2019 1.86 (H) 0.44 - 1.00 mg/dL Final  04/27/2019 1.88 (H) 0.44 - 1.00 mg/dL Final  04/26/2019 1.77 (H) 0.44 -  1.00 mg/dL Final     EKG None  Radiology No results found.  Procedures Procedures (including critical care  time)  Medications Ordered in ED Medications  amLODipine (NORVASC) tablet 2.5 mg (2.5 mg Oral Not Given 09/04/19 2148)  lamoTRIgine (LAMICTAL) tablet 100 mg (100 mg Oral Given 09/04/19 2149)  levETIRAcetam (KEPPRA) tablet 250 mg (250 mg Oral Given 09/04/19 2149)  LORazepam (ATIVAN) tablet 1 mg (1 mg Oral Given 09/04/19 2149)  sodium bicarbonate tablet 650 mg (650 mg Oral Given 09/04/19 2149)  levothyroxine (SYNTHROID) tablet 112 mcg (has no administration in time range)    ED Course  I have reviewed the triage vital signs and the nursing notes.  Pertinent labs & imaging results that were available during my care of the patient were reviewed by me and considered in my medical decision making (see chart for details).    MDM Rules/Calculators/A&P                          Patient presents with anxiety, depression, and suicidal ideations. Due to the intensity of patient's anxiety, depression, and suicidal ideations as well as the sudden change in her overall demeanor and mood, I suspect patient would benefit from inpatient psychiatric treatment. Patient medically cleared. Home medications ordered. Psych team recommended patient for inpatient treatment.  Final Clinical Impression(s) / ED Diagnoses Final diagnoses:  Suicidal ideations    Rx / DC Orders ED Discharge Orders    None       Layla Maw 09/05/19 0041    Charlesetta Shanks, MD 09/05/19 2348

## 2019-09-04 NOTE — BH Assessment (Addendum)
Comprehensive Clinical Assessment (CCA) Note  09/04/2019 Tiffany [name] [id number]  Visit Diagnosis:  Major depressive disorder  Pt presenting to the [place] due to SI with plan to overdose. Pt reported onset 3 weeks ago, on and off. Pt reported stressors are her "bipolar condition" and family relationships. Pt reported being stressed after moving to [place], during pandemic having to isolate and not having friends or family. Pt reported due to having COVID in the past, hospitalized for 3.5 weeks and being taken off psych meds, pt stated "I am more super sensitive to stimulus and noise, I am always on edge and agitated, I can't cope with choices". Pt denied HI, psychosis and drug/alcohol usage. Pt has very supported husband and 4 adult children.   Pt is receiving medication management at [facility]. Pt was pleasant, cooperative and goal oriented regarding needed inpatient and outpatient resources needed.   Disposition: Lindon Romp, NP, patient meets inpatient criteria. TTS to secure placement.   CCA Screening, Triage and Referral (STR)  Patient Reported Information How did you hear about [place]? Self  Referral name: No data recorded Referral phone number: No data recorded  Whom do you see for routine medical problems? Primary Care  Practice/Facility Name: [facility] PCP  Practice/Facility Phone Number: No data recorded Name of Contact: Dr. Billey Chang  Contact Number: No data recorded Contact Fax Number: No data recorded Prescriber Name: No data recorded Prescriber Address (if known): No data recorded  What Is the Reason for Your Visit/Call Today? SI with plan to overdose, worsening depressive symptoms.  How Long Has This Been Causing You Problems? 1 wk - 1 month  What Do You Feel Would Help You the Most Today? Other (Comment) (inpatient tx)   Have You Recently Been in Any Inpatient Treatment (Hospital/Detox/Crisis Center/28-Day Program)? No  Name/Location of Program/Hospital:No data  recorded How Long Were You There? No data recorded When Were You Discharged? No data recorded  Have You Ever Received Services From [facility] Before? No  Who Do You See at [facility]? No data recorded  Have You Recently Had Any Thoughts About Hurting Yourself? Yes  Are You Planning to Commit Suicide/Harm Yourself At This time? Yes   Have you Recently Had Thoughts About Hurting Someone [name]? No  Explanation: No data recorded  Have You Used Any Alcohol or Drugs in the Past 24 Hours? No  How Long Ago Did You Use Drugs or Alcohol? No data recorded What Did You Use and How Much? No data recorded  Do You Currently Have a Therapist/Psychiatrist? Yes  Name of Therapist/Psychiatrist: [facility], medication management   Have You Been Recently Discharged From Any Office Practice or Programs? No  Explanation of Discharge From Practice/Program: No data recorded    CCA Screening Triage Referral Assessment Type of Contact: Tele-Assessment  Is this Initial or Reassessment? Initial Assessment  Date Telepsych consult ordered in [facility]:  No data recorded Time Telepsych consult ordered in [facility]:  No data recorded  Patient Reported Information Reviewed? Yes  Patient Left Without Being Seen? No data recorded Reason for Not Completing Assessment: No data recorded  Collateral Involvement: No data recorded  Does Patient Have a [place]? No data recorded Name and Contact of Legal Guardian: No data recorded If Minor and Not Living with Parent(s), Who has Custody? No data recorded Is CPS involved or ever been involved? Never  Is APS involved or ever been involved? Never   Patient Determined To Be At Risk for Harm To Self  or Others Based on Review of Patient Reported Information or Presenting Complaint? No data recorded Method: No data recorded Availability of Means: No data recorded Intent: No data recorded Notification Required: No data recorded Additional  Information for Danger to Others Potential: No data recorded Additional Comments for Danger to Others Potential: No data recorded Are There Guns or Other Weapons in Your Home? No data recorded Types of Guns/Weapons: No data recorded Are These Weapons Safely Secured?                            No data recorded Who Could Verify You Are Able To Have These Secured: No data recorded Do You Have any Outstanding Charges, Pending Court Dates, Parole/Probation? No data recorded Contacted To Inform of Risk of Harm To Self or Others: No data recorded  Location of Assessment: [facility]   Does Patient Present under Involuntary Commitment? No  IVC Papers Initial File Date: No data recorded  [place] of Residence: [place]   Patient Currently Receiving the Following Services: Medication Management   Determination of Need: Emergent (2 hours)   Options For Referral: Inpatient Hospitalization     CCA Biopsychosocial  Intake/Chief Complaint:  CCA Intake With Chief Complaint Chief Complaint/Presenting Problem: SI with plan to overdose and worsening depression. Patient's Currently Reported Symptoms/Problems: SI with plan to overdose and worsening depression. Individual's Strengths: goal oriented Individual's Preferences: being around family Individual's Abilities: self-awareness Type of Services Patient Feels Are Needed: inpatient psych treatment  Mental Health Symptoms Depression:  Depression: Change in energy/activity, Irritability, Duration of symptoms greater than two weeks, Sleep (too much or little), Hopelessness, Worthlessness, Tearfulness, Fatigue  Mania:  Mania: N/A  Anxiety:   Anxiety: Irritability, Worrying, Restlessness, Fatigue  Psychosis:  Psychosis: None  Trauma:  Trauma: None  Obsessions:  Obsessions: None  Compulsions:  Compulsions: None  Inattention:  Inattention: None  Hyperactivity/Impulsivity:  Hyperactivity/Impulsivity: N/A  Oppositional/Defiant Behaviors:   Oppositional/Defiant Behaviors: N/A  Emotional Irregularity:  Emotional Irregularity: N/A  Other Mood/Personality Symptoms:  Other Mood/Personality Symptoms: n/a   Mental Status Exam Appearance and self-care  Stature:  Stature:  (uta)  Weight:  Weight:  (uta)  Clothing:  Clothing:  (uta)  Grooming:  Grooming:  (uta)  Cosmetic use:  Cosmetic Use:  (uta)  Posture/gait:  Posture/Gait:  (uta)  Motor activity:  Motor Activity:  (uta)  Sensorium  Attention:  Attention: Normal  Concentration:  Concentration: Normal  Orientation:  Orientation: X5  Recall/memory:  Recall/Memory: Normal  Affect and Mood  Affect:  Affect: Depressed  Mood:  Mood: Depressed  Relating  Eye contact:  Eye Contact:  (uta)  Facial expression:  Facial Expression:  Pincus Badder)  Attitude toward examiner:  Attitude Toward Examiner: Cooperative  Thought and Language  Speech flow: Speech Flow: Normal  Thought content:  Thought Content: Appropriate to Mood and Circumstances  Preoccupation:  Preoccupations: Suicide  Hallucinations:  Hallucinations: None  Organization:     Transport planner of Knowledge:  Fund of Knowledge: Average  Intelligence:  Intelligence: Average  Abstraction:  Abstraction: Normal  Judgement:  Judgement: Normal  Reality Testing:  Reality Testing: Realistic  Insight:  Insight: Good  Decision Making:  Decision Making: Normal  Social Functioning  Social Maturity:  Social Maturity:  (n/a)  Social Judgement:  Social Judgement:  (n/a)  Stress  Stressors:  Stressors: Family conflict, Other (Comment) (mental illness)  Coping Ability:  Coping Ability: English as a second language teacher Deficits:  Skill Deficits:  (  n/a)  Supports:  Supports: Family     Religion: Religion/Spirituality Are You A Religious Person?:  Special educational needs teacher)  Leisure/Recreation: Leisure / Recreation Do You Have Hobbies?:  Pincus Badder)  Exercise/Diet: Exercise/Diet Do You Exercise?:  (uta) Have You Gained or Lost A Significant Amount of Weight in  the Past Six Months?: No Do You Follow a Special Diet?:  (uta) Do You Have Any Trouble Sleeping?: Yes Explanation of Sleeping Difficulties: "up and down"  DSM5 Diagnoses: Patient Active Problem List   Diagnosis Date Noted  . Chronic diarrhea 05/05/2019  . Bipolar I disorder, most recent episode depressed ([place])   . Transaminitis   . Essential hypertension   . Seizures ([place])   . Encephalopathy 04/26/2019  . Leukocytosis 04/21/2019  . Thrombocytopenia ([place]) 09/04/2018  . Vitamin D deficiency 09/04/2018  . Psoriatic arthritis ([place]) 09/04/2018  . Tubular adenoma of colon 05/28/2017  . 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 Malignant neoplasm of overlapping sites of left breast in female, estrogen receptor positive ([place]) 03/14/2016  . Chronic kidney disease, stage III (moderate) 01/19/2016  . Tardive dyskinesia 10/18/2015  . History of breast cancer left 2016 12/27/2014  . Osteopenia determined by x-ray 10/31/2014  . Rosacea 05/21/2007  . Bipolar affective disorder, mixed ([place]) 08/16/2004  . Acquired hypothyroidism 04/03/1996    Referrals to Alternative Service(s): Referred to Alternative Service(s):   Place:   Date:   Time:    Referred to Alternative Service(s):   Place:   Date:   Time:    Referred to Alternative Service(s):   Place:   Date:   Time:    Referred to Alternative Service(s):   Place:   Date:   Time:     Herbert Spires AlstonComprehensive Clinical Assessment (CCA) Screening, Triage and Referral Note  09/04/2019 Tiffany [name] [id number]  Visit Diagnosis: No diagnosis found.  Patient Reported Information How did you hear about [place]? Self   Referral name: No data recorded  Referral phone number: No data recorded Whom do you see for routine medical problems? Primary Care   Practice/Facility Name: [facility] PCP   Practice/Facility Phone Number: No data recorded  Name of Contact: Dr. Billey Chang   Contact Number: No data  recorded  Contact Fax Number: No data recorded  Prescriber Name: No data recorded  Prescriber Address (if known): No data recorded What Is the Reason for Your Visit/Call Today? SI with plan to overdose, worsening depressive symptoms.  How Long Has This Been Causing You Problems? 1 wk - 1 month  Have You Recently Been in Any Inpatient Treatment (Hospital/Detox/Crisis Center/28-Day Program)? No   Name/Location of Program/Hospital:No data recorded  How Long Were You There? No data recorded  When Were You Discharged? No data recorded Have You Ever Received Services From [facility] Before? No   Who Do You See at [facility]? No data recorded Have You Recently Had Any Thoughts About Hurting Yourself? Yes   Are You Planning to Commit Suicide/Harm Yourself At This time?  Yes  Have you Recently Had Thoughts About Hurting Someone [name]? No   Explanation: No data recorded Have You Used Any Alcohol or Drugs in the Past 24 Hours? No   How Long Ago Did You Use Drugs or Alcohol?  No data recorded  What Did You Use and How Much? No data recorded What Do You Feel Would Help You the Most Today? Other (Comment) (inpatient tx)  Do You Currently Have a Therapist/Psychiatrist? Yes   Name  of Therapist/Psychiatrist: [facility], medication management   Have You Been Recently Discharged From Any Office Practice or Programs? No   Explanation of Discharge From Practice/Program:  No data recorded    CCA Screening Triage Referral Assessment Type of Contact: Tele-Assessment   Is this Initial or Reassessment? Initial Assessment   Date Telepsych consult ordered in [facility]:  No data recorded  Time Telepsych consult ordered in [facility]:  No data recorded Patient Reported Information Reviewed? Yes   Patient Left Without Being Seen? No data recorded  Reason for Not Completing Assessment: No data recorded Collateral Involvement: No data recorded Does Patient Have a [place]? No data  recorded  Name and Contact of Legal Guardian:  No data recorded If Minor and Not Living with Parent(s), Who has Custody? No data recorded Is CPS involved or ever been involved? Never  Is APS involved or ever been involved? Never  Patient Determined To Be At Risk for Harm To Self or Others Based on Review of Patient Reported Information or Presenting Complaint? No data recorded  Method: No data recorded  Availability of Means: No data recorded  Intent: No data recorded  Notification Required: No data recorded  Additional Information for Danger to Others Potential:  No data recorded  Additional Comments for Danger to Others Potential:  No data recorded  Are There Guns or Other Weapons in Your Home?  No data recorded   Types of Guns/Weapons: No data recorded   Are These Weapons Safely Secured?                              No data recorded   Who Could Verify You Are Able To Have These Secured:    No data recorded Do You Have any Outstanding Charges, Pending Court Dates, Parole/Probation? No data recorded Contacted To Inform of Risk of Harm To Self or Others: No data recorded Location of [facility]: [facility]  Does Patient Present under Involuntary Commitment? No   IVC Papers Initial File Date: No data recorded  [place] of Residence: [place]  Patient Currently Receiving the Following Services: Medication Management   Determination of Need: Emergent (2 hours)   Options For Referral: Inpatient Hospitalization  Disposition: Lindon Romp, NP, patient meets inpatient criteria. TTS to secure placement.   [name], [facility]

## 2019-09-04 NOTE — ED Triage Notes (Signed)
Patient reports feeling suicidal for 3 weeks off and on. She attributes these feelings to her bipolar condition. Patient planned to OD on medication.

## 2019-09-04 NOTE — ED Notes (Signed)
TTS assessment completed. Lindon Romp, NP, patient meets inpatient criteria. TTS to secure placement.

## 2019-09-05 DIAGNOSIS — F3130 Bipolar disorder, current episode depressed, mild or moderate severity, unspecified: Secondary | ICD-10-CM | POA: Diagnosis not present

## 2019-09-05 DIAGNOSIS — F319 Bipolar disorder, unspecified: Secondary | ICD-10-CM | POA: Diagnosis not present

## 2019-09-05 LAB — SARS CORONAVIRUS 2 BY RT PCR (HOSPITAL ORDER, PERFORMED IN ~~LOC~~ HOSPITAL LAB): SARS Coronavirus 2: NEGATIVE

## 2019-09-05 MED ORDER — HYDROXYZINE HCL 10 MG PO TABS: 10.0000 mg | ORAL_TABLET | Freq: Three times a day (TID) | ORAL | 0 refills | Status: DC | PRN

## 2019-09-05 MED ORDER — LORAZEPAM 0.5 MG PO TABS
0.5000 mg | ORAL_TABLET | Freq: Once | ORAL | Status: AC
  Administered 2019-09-05: 0.5 mg via ORAL

## 2019-09-05 MED ORDER — HYDROXYZINE HCL 10 MG PO TABS: 10.0000 mg | ORAL_TABLET | Freq: Three times a day (TID) | ORAL | Status: DC | PRN

## 2019-09-05 NOTE — ED Notes (Signed)
Patient states she wants to sign herself out-denies SI/HI

## 2019-09-05 NOTE — Consult Note (Signed)
[facility] Psych ED Discharge  09/05/2019 11:01 AM Psalms Olarte  MRN:  [id number] Principal Problem: Bipolar I disorder, most recent episode depressed [facility]) Discharge Diagnoses: Principal Problem:   Bipolar I disorder, most recent episode depressed ([place])  Subjective: "I need an Ativan and to go home."  Patient seen and evaluated in person by this provider.  Her depression returned with complications from Covid earlier this year.  Her neurologist has been managing her medications and restarted her on Lamictal along with Keppra for seizures.  She has Ativan as needed has been using this for anxiety and depression 1 mg twice daily.  She feels like she needs more medications to help with her mood.  Denies current suicidal/homicidal ideations, hallucinations, paranoia, mania, and other concerning psychiatric issues.  She does have a moderate level of depression and moderate to high anxiety.  No substance abuse.  She does desire to have her medications managed by psychiatrist and is interested in intensive outpatient and partial hospitalization.  Resources provided along with the as needed anxiety medication for hydroxyzine 10 mg 3 times daily anxiety when not using her Ativan.  Her husband is at the bedside and does not have any safety concerns and plans on managing her medications which he states he will lock up for precautions, no guns in the home.  She states she feels safe returning home and wants to follow-up with her outpatient provider tomorrow and explore the resources this provider gave her.  Dr. Mallie Darting reviewed this client and concurs with the treatment plan.  Psychiatrically stable for discharge.  HPI per TTS:   Pt presenting to the [facility] due to SI with plan to overdose. Pt reported onset 3 weeks ago, on and off. Pt reported stressors are her "bipolar condition" and family relationships. Pt reported being stressed after moving to , during pandemic having to isolate and not having friends or family. Pt  reported due to having COVID in the past, hospitalized for 3.5 weeks and being taken off psych meds, pt stated "I am more super sensitive to stimulus and noise, I am always on edge and agitated, I can't cope with choices". Pt denied HI, psychosis and drug/alcohol usage. Pt has very supported husband and 4 adult children.   Pt is receiving medication management at [facility]. Pt was pleasant, cooperative and goal oriented regarding needed inpatient and outpatient resources needed.   Total Time spent with patient: 1 hour  Past Psychiatric History: bipolar d/o  Past Medical History:  Past Medical History:  Diagnosis Date  . Bipolar 1 disorder ([place])   . Breast cancer ([place])   . Chronic diarrhea    loose stools twice a day on average for years  . Chronic kidney disease (CKD), stage III (moderate)   . Depression 1987  . Malignant neoplasm of overlapping sites of left breast in female, estrogen receptor positive ([place]) 03/14/2016   Dx in 09/2014, s/p bilateral mastectomies and ALND, 0/10 LN. 1.4 cm  Grade I invasive lobular, ER and PR +/Her--, Ki 67 <5% Tried anastrozole for one month, but developed suicidal idea  . Parkinson's disease ([place]) 2012  . Psoriatic arthritis ([place])   . PTSD (post-traumatic stress disorder)   . Secondary hyperparathyroidism ([place])   . Tardive dyskinesia     Past Surgical History:  Procedure Laterality Date  . ABDOMINAL HYSTERECTOMY  1987  . CHOLECYSTECTOMY  1979  . DILATION AND CURETTAGE OF UTERUS  1973  . MASTECTOMY Bilateral 09/19/2014  . TONSILLECTOMY  1970  . URETERAL  REIMPLANTION Bilateral 1974   Family History:  Family History  Problem Relation Age of Onset  . Cancer Mother   . Cancer Sister   . Stroke Maternal Grandfather   . Diabetes Paternal Grandfather   . Cancer Sister    Family Psychiatric  History: none Social History:  Social History   Substance and Sexual Activity  Alcohol Use Never     Social History   Substance and Sexual Activity   Drug Use Never    Social History   Socioeconomic History  . Marital status: Married    Spouse name: Not on file  . Number of children: Not on file  . Years of education: Not on file  . Highest education level: Not on file  Occupational History  . Not on file  Tobacco Use  . Smoking status: Never Smoker  . Smokeless tobacco: Never Used  Vaping Use  . Vaping Use: Never used  Substance and Sexual Activity  . Alcohol use: Never  . Drug use: Never  . Sexual activity: Not Currently  Other Topics Concern  . Not on file  Social History Narrative   Moved to area from [place] 08/2018   Lives one story home   Right handed.   Social Determinants of Health   Financial Resource Strain:   . Difficulty of Paying Living Expenses:   Food Insecurity:   . Worried About Charity fundraiser in the Last Year:   . Arboriculturist in the Last Year:   Transportation Needs:   . Film/video editor (Medical):   Marland Kitchen Lack of Transportation (Non-Medical):   Physical Activity:   . Days of Exercise per Week:   . Minutes of Exercise per Session:   Stress:   . Feeling of Stress :   Social Connections:   . Frequency of Communication with Friends and Family:   . Frequency of Social Gatherings with Friends and Family:   . Attends Religious Services:   . Active Member of Clubs or Organizations:   . Attends Archivist Meetings:   Marland Kitchen Marital Status:     Has this patient used any form of tobacco in the last 30 days? (Cigarettes, Smokeless Tobacco, Cigars, and/or Pipes) None  Current Medications: Current Facility-Administered Medications  Medication Dose Route Frequency Provider Last Rate Last Admin  . amLODipine (NORVASC) tablet 2.5 mg  2.5 mg Oral Daily Joy, Shawn C, PA-C      . hydrOXYzine (ATARAX/VISTARIL) tablet 10 mg  10 mg Oral TID PRN Patrecia Pour, NP      . lamoTRIgine (LAMICTAL) tablet 100 mg  100 mg Oral QHS Joy, Shawn C, PA-C   100 mg at 09/04/19 2149  . levETIRAcetam  (KEPPRA) tablet 250 mg  250 mg Oral BID Joy, Shawn C, PA-C   250 mg at 09/04/19 2149  . levothyroxine (SYNTHROID) tablet 112 mcg  112 mcg Oral Q0600 Joy, Shawn C, PA-C   112 mcg at 09/05/19 0710  . sodium bicarbonate tablet 650 mg  650 mg Oral BID Joy, Shawn C, PA-C   650 mg at 09/04/19 2149   Current Outpatient Medications  Medication Sig Dispense Refill  . amLODipine (NORVASC) 2.5 MG tablet Take 1 tablet (2.5 mg total) by mouth daily. 90 tablet 3  . calcitRIOL (ROCALTROL) 0.25 MCG capsule Take 0.25 mcg by mouth every Monday, Wednesday, and Friday.     . Certolizumab Pegol (CIMZIA [place]) Inject 200 mg into the skin every 30 (thirty) days.    Marland Kitchen  Cholecalciferol 50 MCG (2000 UT) TABS Take 1 tablet (2,000 Units total) by mouth See admin instructions. Take 1 tablet by mouth daily 30 tablet 0  . doxycycline (VIBRA-TABS) 100 MG tablet Take 1 tablet (100 mg total) by mouth 2 (two) times daily. 20 tablet 0  . hydrOXYzine (ATARAX/VISTARIL) 10 MG tablet Take 1 tablet (10 mg total) by mouth 3 (three) times daily as needed for anxiety. 30 tablet 0  . lamoTRIgine (LAMICTAL) 100 MG tablet Take 1 tablet (100 mg total) by mouth at bedtime. 90 tablet 1  . levETIRAcetam (KEPPRA) 250 MG tablet TAKE 1 TABLET(250 MG) BY MOUTH TWICE DAILY 180 tablet 3  . levothyroxine (SYNTHROID) 112 MCG tablet Take 1 tablet (112 mcg total) by mouth daily at 6 (six) AM. 90 tablet 3  . loperamide (IMODIUM) 2 MG capsule Take 1 capsule (2 mg total) by mouth as needed for diarrhea or loose stools. 30 capsule 0  . LORazepam (ATIVAN) 1 MG tablet Take 1 tablet (1 mg total) by mouth 2 (two) times daily. 60 tablet 2  . sodium bicarbonate 650 MG tablet Take 1 tablet (650 mg total) by mouth 2 (two) times daily. 60 tablet 0   PTA Medications: (Not in a hospital admission)   Musculoskeletal: Strength & Muscle Tone: within normal limits Gait & Station: normal Patient leans: N/A  Psychiatric Specialty Exam: Physical Exam Vitals and nursing  note reviewed.  Constitutional:      Appearance: Normal appearance.  HENT:     Head: Normocephalic.     Nose: Nose normal.  Pulmonary:     Effort: Pulmonary effort is normal.  Musculoskeletal:        General: Normal range of motion.     Cervical back: Normal range of motion.  Neurological:     General: No focal deficit present.     Mental Status: She is alert and oriented to person, place, and time.  Psychiatric:        Attention and Perception: Attention and perception normal.        Mood and Affect: Mood is anxious and depressed.        Speech: Speech normal.        Behavior: Behavior normal. Behavior is cooperative.        Thought Content: Thought content normal.        Cognition and Memory: Cognition and memory normal.        Judgment: Judgment normal.     Review of Systems  Psychiatric/Behavioral: Positive for dysphoric mood. The patient is nervous/anxious.   All other systems reviewed and are negative.   Blood pressure 134/72, pulse 65, temperature 97.8 F (36.6 C), temperature source Oral, resp. rate 14, height 5\' 4"  (1.626 m), weight 94.3 kg, SpO2 100 %.Body mass index is 35.7 kg/m.  General Appearance: Casual  Eye Contact:  Good  Speech:  Normal Rate  Volume:  Normal  Mood:  Anxious and Depressed  Affect:  Congruent  Thought Process:  Coherent and Descriptions of Associations: Intact  Orientation:  Full (Time, Place, and Person)  Thought Content:  Logical  Suicidal Thoughts:  No  Homicidal Thoughts:  No  Memory:  Immediate;   Fair Recent;   Good Remote;   Good  Judgement:  Good  Insight:  Good  Psychomotor Activity:  Normal  Concentration:  Concentration: Fair and Attention Span: Fair  Recall:  Good  Fund of Knowledge:  Good  Language:  Good  Akathisia:  No  Handed:  Right  AIMS (if indicated):     Assets:  Housing Leisure Time Resilience Social Support  ADL's:  Intact  Cognition:  WNL  Sleep:        Demographic Factors:  Age 83 or older and  Caucasian  Loss Factors: Decline in physical health  Historical Factors: NA  Risk Reduction Factors:   Sense of responsibility to family, Living with another person, especially a relative, Positive social support and Positive therapeutic relationship  Continued Clinical Symptoms:  Depression, moderate and anxiety, high  Cognitive Features That Contribute To Risk:  None    Suicide Risk:  Minimal: No identifiable suicidal ideation.  Patients presenting with no risk factors but with morbid ruminations; may be classified as minimal risk based on the severity of the depressive symptoms   Plan Of Care/Follow-up recommendations:  Bipolar affective disorder: -Continue Lamictal 100 mg daily -follow up with regular outpatient provider at [facility] -follow up with intensive outpatient and partial hospitalization--resources provided to the client  Anxiety/panic attack: -Continue Ativan 1 mg BID -Rx hydroxyzine 10  mg TID PRN when not taking her Ativan Activity:  as tolerated Diet:  heart healthy diet  Disposition: discharge with husband Waylan Boga, NP 09/05/2019, 11:01 AM

## 2019-09-05 NOTE — Discharge Instructions (Signed)
[facility] Intensive Outpatient (3 hours/3 times a week) and partial hospitalization programs (6 hours/5 times a week) Mental health clinic in [place], [place] Address: [address], [place], [place] [address] Hours:  Caesar Bookman  [phone]  [facility] Intensive Outpatient (3 hours/3 times a week) Psychiatrist in [place], [place] Address: [place], [place], [place] [address] Hours: [place] Phone: [phone]

## 2019-09-05 NOTE — ED Notes (Signed)
Signature pad in room not working. Pt verbalized understanding;

## 2019-09-06 ENCOUNTER — Other Ambulatory Visit: Payer: Self-pay

## 2019-09-06 ENCOUNTER — Ambulatory Visit (INDEPENDENT_AMBULATORY_CARE_PROVIDER_SITE_OTHER): Payer: Medicare Other | Admitting: Adult Health

## 2019-09-06 ENCOUNTER — Encounter: Payer: Self-pay | Admitting: Adult Health

## 2019-09-06 DIAGNOSIS — F411 Generalized anxiety disorder: Secondary | ICD-10-CM

## 2019-09-06 DIAGNOSIS — F4310 Post-traumatic stress disorder, unspecified: Secondary | ICD-10-CM | POA: Diagnosis not present

## 2019-09-06 DIAGNOSIS — F331 Major depressive disorder, recurrent, moderate: Secondary | ICD-10-CM

## 2019-09-06 DIAGNOSIS — G4700 Insomnia, unspecified: Secondary | ICD-10-CM

## 2019-09-06 DIAGNOSIS — F319 Bipolar disorder, unspecified: Secondary | ICD-10-CM

## 2019-09-06 LAB — LAMOTRIGINE LEVEL: Lamotrigine Lvl: 2.1 ug/mL (ref 2.0–20.0)

## 2019-09-06 MED ORDER — LAMOTRIGINE 150 MG PO TABS: 150.0000 mg | ORAL_TABLET | Freq: Every day | ORAL | 0 refills | Status: DC

## 2019-09-06 NOTE — Progress Notes (Signed)
Tiffany [name] [id number] 04-22-1950 69 y.o.  Subjective:   Patient ID:  Tiffany [name] is a 69 y.o. (DOB 05/30/1950) female.  Chief Complaint: No chief complaint on file.   HPI Tiffany [name] presents to the office today for follow-up of PTSD, insomnia,GAD, BPD 1.  Describes mood today as "ok". Pleasant. Mood symptoms - denies depression, anxiety, and irritability. Stating "I feel fine today". Seen in the ED on Saturday night for SI with a plan to overdose. Spent the night in the ED and was discharged home. Stating "there was no where to go". Feels like she is cycling "in and out". Having highs and lows". Stating "it's erratic". Going from "hypomania to depression within hours". Willing to increase Lamictal from 100mg  to 150mg . Varying interest and motivation. Taking medications as prescribed.  Energy levels stable "for anything I want to do". Active, exercising regularly. Walking in the evenings.  Enjoys some usual interests and activities. Married. Lives with husband of 59 years and their daughter. Getting outside more. Talking to family and friends.  Appetite increased. Weight gain - a few pounds. Sleeping better at nights. Averages 8 to 10 hours hours. Napping during the day some days.  Focus and concentration "good since I'm off the medication". Completing tasks. Managing some aspects of household - cleaning, cooking, laundry.  Denies SI or HI. Denies AH or VH.   Previous medications: Celexa, Zyprexa, Tegretol, Depakote, Serzone, Topamax, Seroquel, Effexor, Lexapro, Desipramine, Neurontin, Abilify, Geodon, Propanolol, Cymbalta, Cogentin, Trihexyphenadyl, Simet, Provigil, Selegiline, Requip, Amantadine, Prozac, Mirapex, Azilect, Metoclopramide, Baclofen, Artane, Namenda, Latuda.   GAD-7     Office Visit from 05/14/2019 in [facility]  Total GAD-7 Score 21    Mini-Mental     Office Visit from 06/07/2019 in [facility]  Total Score (max 30 points ) 29     PHQ2-9     Office Visit from 08/24/2019 in [facility] Visit from 05/14/2019 in [facility]  PHQ-2 Total Score 0 1  PHQ-9 Total Score 4 8       Review of Systems:  Review of Systems  Musculoskeletal: Negative for gait problem.  Neurological: Negative for tremors.  Psychiatric/Behavioral:       Please refer to HPI    Medications: I have reviewed the patient's current medications.  Current Outpatient Medications  Medication Sig Dispense Refill  . amLODipine (NORVASC) 2.5 MG tablet Take 1 tablet (2.5 mg total) by mouth daily. 90 tablet 3  . calcitRIOL (ROCALTROL) 0.25 MCG capsule Take 0.25 mcg by mouth every Monday, Wednesday, and Friday.     . Certolizumab Pegol (CIMZIA [place]) Inject 200 mg into the skin every 30 (thirty) days.    . Cholecalciferol 50 MCG (2000 UT) TABS Take 1 tablet (2,000 Units total) by mouth See admin instructions. Take 1 tablet by mouth daily 30 tablet 0  . doxycycline (VIBRA-TABS) 100 MG tablet Take 1 tablet (100 mg total) by mouth 2 (two) times daily. 20 tablet 0  . hydrOXYzine (ATARAX/VISTARIL) 10 MG tablet Take 1 tablet (10 mg total) by mouth 3 (three) times daily as needed for anxiety. 30 tablet 0  . lamoTRIgine (LAMICTAL) 150 MG tablet Take 1 tablet (150 mg total) by mouth at bedtime. 90 tablet 0  . levETIRAcetam (KEPPRA) 250 MG tablet TAKE 1 TABLET(250 MG) BY MOUTH TWICE DAILY 180 tablet 3  . levothyroxine (SYNTHROID) 112 MCG tablet Take 1 tablet (112 mcg total) by mouth daily at 6 (six) AM. 90 tablet  3  . loperamide (IMODIUM) 2 MG capsule Take 1 capsule (2 mg total) by mouth as needed for diarrhea or loose stools. 30 capsule 0  . LORazepam (ATIVAN) 1 MG tablet Take 1 tablet (1 mg total) by mouth 2 (two) times daily. 60 tablet 2  . sodium bicarbonate 650 MG tablet Take 1 tablet (650 mg total) by mouth 2 (two) times daily. 60 tablet 0   No current facility-administered medications for this visit.     Medication Side Effects: None  Allergies:  Allergies  Allergen Reactions  . Aripiprazole Other (See Comments)    Parkinsonism     . Lactose Intolerance (Gi) Diarrhea  . Methotrexate Other (See Comments)    Hair loss, severe stomatitis    . Cefdinir Diarrhea    Other reaction(s): Diarrhea Yeast infection and fever; negative c diff  . Etanercept Other (See Comments)    Headaches    . Exemestane Other (See Comments)    Suicidal thoughts with medication    . Fluoxetine Other (See Comments)    Parkinsonism  . Methylprednisolone Sodium Succ Other (See Comments)    Agitated mania  . Epinephrine Palpitations    tachycardia   . Nitrofurantoin Nausea And Vomiting and Rash         Past Medical History:  Diagnosis Date  . Bipolar 1 disorder ([place])   . Breast cancer ([place])   . Chronic diarrhea    loose stools twice a day on average for years  . Chronic kidney disease (CKD), stage III (moderate)   . Depression 1987  . Malignant neoplasm of overlapping sites of left breast in female, estrogen receptor positive ([place]) 03/14/2016   Dx in 09/2014, s/p bilateral mastectomies and ALND, 0/10 LN. 1.4 cm  Grade I invasive lobular, ER and PR +/Her--, Ki 67 <5% Tried anastrozole for one month, but developed suicidal idea  . Parkinson's disease ([place]) 2012  . Psoriatic arthritis ([place])   . PTSD (post-traumatic stress disorder)   . Secondary hyperparathyroidism ([place])   . Tardive dyskinesia     Family History  Problem Relation Age of Onset  . Cancer Mother   . Cancer Sister   . Stroke Maternal Grandfather   . Diabetes Paternal Grandfather   . Cancer Sister     Social History   Socioeconomic History  . Marital status: Married    Spouse name: Not on file  . Number of children: Not on file  . Years of education: Not on file  . Highest education level: Not on file  Occupational History  . Not on file  Tobacco Use  . Smoking status: Never Smoker  . Smokeless tobacco: Never Used   Vaping Use  . Vaping Use: Never used  Substance and Sexual Activity  . Alcohol use: Never  . Drug use: Never  . Sexual activity: Not Currently  Other Topics Concern  . Not on file  Social History Narrative   Moved to area from [place] 08/2018   Lives one story home   Right handed.   Social Determinants of Health   Financial Resource Strain:   . Difficulty of Paying Living Expenses:   Food Insecurity:   . Worried About Charity fundraiser in the Last Year:   . Arboriculturist in the Last Year:   Transportation Needs:   . Film/video editor (Medical):   Marland Kitchen Lack of Transportation (Non-Medical):   Physical Activity:   . Days of Exercise per Week:   .  Minutes of Exercise per Session:   Stress:   . Feeling of Stress :   Social Connections:   . Frequency of Communication with Friends and Family:   . Frequency of Social Gatherings with Friends and Family:   . Attends Religious Services:   . Active Member of Clubs or Organizations:   . Attends Archivist Meetings:   Marland Kitchen Marital Status:   Intimate Partner Violence:   . Fear of Current or Ex-Partner:   . Emotionally Abused:   Marland Kitchen Physically Abused:   . Sexually Abused:     Past Medical History, Surgical history, Social history, and Family history were reviewed and updated as appropriate.   Please see review of systems for further details on the patient's review from today.   Objective:   Physical Exam:  There were no vitals taken for this visit.  Physical Exam Constitutional:      General: She is not in acute distress. Musculoskeletal:        General: No deformity.  Neurological:     Mental Status: She is alert and oriented to person, place, and time.     Coordination: Coordination normal.  Psychiatric:        Attention and Perception: Attention and perception normal. She does not perceive auditory or visual hallucinations.        Mood and Affect: Mood normal. Mood is not anxious or depressed. Affect is  not labile, blunt, angry or inappropriate.        Speech: Speech normal.        Behavior: Behavior normal.        Thought Content: Thought content normal. Thought content is not paranoid or delusional. Thought content does not include homicidal or suicidal ideation. Thought content does not include homicidal or suicidal plan.        Cognition and Memory: Cognition and memory normal.        Judgment: Judgment normal.     Comments: Insight intact     Lab Review:     Component Value Date/Time   NA 142 09/04/2019 1824   NA 138 06/08/2019 0000   K 4.3 09/04/2019 1824   CL 109 09/04/2019 1824   CO2 24 09/04/2019 1824   GLUCOSE 101 (H) 09/04/2019 1824   BUN 36 (H) 09/04/2019 1824   BUN 29 (A) 06/08/2019 0000   CREATININE 1.81 (H) 09/04/2019 1824   CALCIUM 9.3 09/04/2019 1824   PROT 8.0 09/04/2019 1824   ALBUMIN 4.3 09/04/2019 1824   AST 15 09/04/2019 1824   ALT 15 09/04/2019 1824   ALKPHOS 131 (H) 09/04/2019 1824   BILITOT 0.7 09/04/2019 1824   GFRNONAA 28 (L) 09/04/2019 1824   GFRAA 32 (L) 09/04/2019 1824       Component Value Date/Time   WBC 11.0 (H) 09/04/2019 1824   RBC 4.88 09/04/2019 1824   HGB 14.6 09/04/2019 1824   HCT 46.5 (H) 09/04/2019 1824   PLT 258 09/04/2019 1824   MCV 95.3 09/04/2019 1824   MCH 29.9 09/04/2019 1824   MCHC 31.4 09/04/2019 1824   RDW 13.2 09/04/2019 1824   LYMPHSABS 2.5 04/27/2019 0544   MONOABS 0.5 04/27/2019 0544   EOSABS 0.2 04/27/2019 0544   BASOSABS 0.0 04/27/2019 0544    Lithium Lvl  Date Value Ref Range Status  04/21/2019 0.79 0.60 - 1.20 mmol/L Final    Comment:    Performed at [facility], [place] [address] [place], [place] [address]     No  results found for: PHENYTOIN, PHENOBARB, VALPROATE, CBMZ   .res Assessment: Plan:    Plan:  Increase Lamictal 100mg  to 150mg  at hs  Discussed IOP - [facility] or [facility].   Lorazepam 1mg  BID for anxiety  Also taking Keppra 250mg  BID for seizures  Discussed Benadryl for sleep  - may take 25mg  to 50mg  daily  Therapist - [name] - seeing every 2 weeks  RTC 4 weeks  Discussed potential benefits, risk, and side effects of benzodiazepines to include potential risk of tolerance and dependence, as well as possible drowsiness.  Advised patient not to drive if experiencing drowsiness and to take lowest possible effective dose to minimize risk of dependence and tolerance.  Diagnoses and all orders for this visit:  Major depressive disorder, recurrent episode, moderate (HCC)  Bipolar I disorder (HCC) -     lamoTRIgine (LAMICTAL) 150 MG tablet; Take 1 tablet (150 mg total) by mouth at bedtime.  PTSD (post-traumatic stress disorder)  Generalized anxiety disorder  Insomnia, unspecified type     Please see After Visit Summary for patient specific instructions.  Future Appointments  Date Time Provider [place]  09/08/2019  1:00 PM Cottle, Lucious Groves, LCSW [facility] None  09/15/2019  1:00 PM Cottle, Bambi G, LCSW [facility] None  09/22/2019  9:40 AM Miyanna Wiersma, Berdie Ogren, NP [facility] None  09/22/2019  1:00 PM Cottle, Bambi G, LCSW [facility] None  09/24/2019  1:00 PM Leamon Arnt, MD [facility]  09/29/2019  1:00 PM Cottle, Bambi G, LCSW [facility] None  11/12/2019  1:00 PM Warren Danes, PA-C [facility]  12/14/2019 11:30 AM Cameron Sprang, MD [facility] None    No orders of the defined types were placed in this encounter.   -------------------------------

## 2019-09-07 ENCOUNTER — Telehealth (INDEPENDENT_AMBULATORY_CARE_PROVIDER_SITE_OTHER): Payer: Medicare Other | Admitting: Physician Assistant

## 2019-09-07 ENCOUNTER — Telehealth: Payer: Self-pay | Admitting: Psychiatry

## 2019-09-07 ENCOUNTER — Encounter: Payer: Self-pay | Admitting: Physician Assistant

## 2019-09-07 VITALS — Ht 64.0 in | Wt 204.0 lb

## 2019-09-07 DIAGNOSIS — L03211 Cellulitis of face: Secondary | ICD-10-CM

## 2019-09-07 LAB — LEVETIRACETAM LEVEL: Levetiracetam Lvl: 9.1 ug/mL — ABNORMAL LOW (ref 10.0–40.0)

## 2019-09-07 NOTE — Telephone Encounter (Signed)
Received after hours call from pt. Pt reports that she has been having SI. Reports that she has PTSD and Bipolar D/O. She reports that she went to the hospital over the weekend due to [place]. She saw Garwin Brothers, NP on Monday. She reports that she has some rapid cycling with brief periods of euthymia and then sudden onset of SI and and anxiety.   Reports that in the past she was "over-medicated" until multiple medications were discontinued when she was hospitalized for COVID.   She reports that she has some middle of the night awakening and typically takes Ativan prn.   She reports that 2 hours ago she took Ativan and benadryl prn and felt calmer afterwards. She reports that her thinking is now clearer. Denies SI at time of call. She reports, "I'm safe tonight." She reports that her husband and daughter are supportive and could assist with transporting her to the hospital if needed. Husband is managing her medications.   Discussed possible inpatient hospitalization options, hospitals that offer direct psychiatric assessment, and PHP/IOP programs. Pt reports that she will consider these options since she has been experiencing recurrent SI. Discussed that information would be relayed to Garwin Brothers, DNP. Advised pt to call back with any questions or worsening s/s.

## 2019-09-07 NOTE — Progress Notes (Signed)
Virtual Visit via Video   I connected with Tiffany [name] on 09/07/19 at  3:30 PM EDT by a video enabled telemedicine application and verified that I am speaking with the correct person using two identifiers. Location patient: Home Location provider: [facility], Office Persons participating in the virtual visit: [place], [place] PA-C  I discussed the limitations of evaluation and management by telemedicine and the availability of in person appointments. The patient expressed understanding and agreed to proceed.  Subjective:   HPI:   Cellulitis of face She saw me on 08/24/19 and she was given doxycycline for her cellulitis of her face.  She states that she is taking the medication as prescribed.  Overall she has had significant improvement of her symptoms.  She denies worsening swelling, redness, changes to vision.  She states that her symptoms are almost completely resolved.  She wanted to follow-up today to make sure that there was nothing else that needed to be done.   Denies fevers, chills, n/v, malaise   ROS: See pertinent positives and negatives per HPI.  Patient Active Problem List   Diagnosis Date Noted  . Chronic diarrhea 05/05/2019  . Bipolar I disorder, most recent episode depressed ([place])   . Transaminitis   . Essential hypertension   . Seizures ([place])   . Encephalopathy 04/26/2019  . Leukocytosis 04/21/2019  . Thrombocytopenia ([place]) 09/04/2018  . Vitamin D deficiency 09/04/2018  . Psoriatic arthritis ([place]) 09/04/2018  . Tubular adenoma of colon 05/28/2017  . History of colonic polyps 05/28/2017  . 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 Malignant neoplasm of overlapping sites of left breast in female, estrogen receptor positive ([place]) 03/14/2016  . Chronic kidney disease, stage III (moderate) 01/19/2016  . Tardive dyskinesia 10/18/2015  . History of breast cancer left 2016 12/27/2014  . Osteopenia determined by x-ray  10/31/2014  . Rosacea 05/21/2007  . Bipolar affective disorder, mixed ([place]) 08/16/2004  . Acquired hypothyroidism 04/03/1996    Social History   Tobacco Use  . Smoking status: Never Smoker  . Smokeless tobacco: Never Used  Substance Use Topics  . Alcohol use: Never    Current Outpatient Medications:  .  amLODipine (NORVASC) 2.5 MG tablet, Take 1 tablet (2.5 mg total) by mouth daily., Disp: 90 tablet, Rfl: 3 .  calcitRIOL (ROCALTROL) 0.25 MCG capsule, Take 0.25 mcg by mouth every Monday, Wednesday, and Friday. , Disp: , Rfl:  .  Certolizumab Pegol (CIMZIA [place]), Inject 200 mg into the skin every 30 (thirty) days., Disp: , Rfl:  .  Cholecalciferol 50 MCG (2000 UT) TABS, Take 1 tablet (2,000 Units total) by mouth See admin instructions. Take 1 tablet by mouth daily, Disp: 30 tablet, Rfl: 0 .  hydrOXYzine (ATARAX/VISTARIL) 10 MG tablet, Take 1 tablet (10 mg total) by mouth 3 (three) times daily as needed for anxiety., Disp: 30 tablet, Rfl: 0 .  lamoTRIgine (LAMICTAL) 150 MG tablet, Take 1 tablet (150 mg total) by mouth at bedtime., Disp: 90 tablet, Rfl: 0 .  levETIRAcetam (KEPPRA) 250 MG tablet, TAKE 1 TABLET(250 MG) BY MOUTH TWICE DAILY, Disp: 180 tablet, Rfl: 3 .  levothyroxine (SYNTHROID) 112 MCG tablet, Take 1 tablet (112 mcg total) by mouth daily at 6 (six) AM., Disp: 90 tablet, Rfl: 3 .  LORazepam (ATIVAN) 1 MG tablet, Take 1 tablet (1 mg total) by mouth 2 (two) times daily., Disp: 60 tablet, Rfl: 2 .  sodium bicarbonate 650 MG tablet, Take 1 tablet (650  mg total) by mouth 2 (two) times daily., Disp: 60 tablet, Rfl: 0 .  doxycycline (VIBRA-TABS) 100 MG tablet, Take 1 tablet (100 mg total) by mouth 2 (two) times daily. (Patient not taking: Reported on 09/07/2019), Disp: 20 tablet, Rfl: 0 .  loperamide (IMODIUM) 2 MG capsule, Take 1 capsule (2 mg total) by mouth as needed for diarrhea or loose stools. (Patient not taking: Reported on 09/07/2019), Disp: 30 capsule, Rfl: 0  Allergies  Allergen  Reactions  . Aripiprazole Other (See Comments)    Parkinsonism     . Lactose Intolerance (Gi) Diarrhea  . Methotrexate Other (See Comments)    Hair loss, severe stomatitis    . Cefdinir Diarrhea    Other reaction(s): Diarrhea Yeast infection and fever; negative c diff  . Etanercept Other (See Comments)    Headaches    . Exemestane Other (See Comments)    Suicidal thoughts with medication    . Fluoxetine Other (See Comments)    Parkinsonism  . Methylprednisolone Sodium Succ Other (See Comments)    Agitated mania  . Epinephrine Palpitations    tachycardia   . Nitrofurantoin Nausea And Vomiting and Rash         Objective:   VITALS: Per patient if applicable, see vitals. GENERAL: Alert, appears well and in no acute distress. HEENT: Atraumatic, conjunctiva clear, no obvious abnormalities on inspection of external nose and ears. Very mild erythema and slight swelling to lateral lower right eyelid NECK: Normal movements of the head and neck. CARDIOPULMONARY: No increased WOB. Speaking in clear sentences. I:E ratio WNL.  MS: Moves all visible extremities without noticeable abnormality. PSYCH: Pleasant and cooperative, well-groomed. Speech normal rate and rhythm. Affect is appropriate. Insight and judgement are appropriate. Attention is focused, linear, and appropriate.  NEURO: CN grossly intact. Oriented as arrived to appointment on time with no prompting. Moves both UE equally.  SKIN: No obvious lesions, wounds, erythema, or cyanosis noted on face or hands.  Assessment and Plan:   Apurva was seen today for eye cellulitis.  Diagnoses and all orders for this visit:  Cellulitis of face   Symptoms significantly improved.  Worsening precautions reviewed.  I do think that we can hold off on starting any further intervention given she is improving each day.  Patient verbalized understanding to plan.  . Reviewed expectations re: course of current medical issues. . Discussed  self-management of symptoms. . Outlined signs and symptoms indicating need for more acute intervention. . Patient verbalized understanding and all questions were answered. Marland Kitchen Health Maintenance issues including appropriate healthy diet, exercise, and smoking avoidance were discussed with patient. . See orders for this visit as documented in the electronic medical record.  I discussed the assessment and treatment plan with the patient. The patient was provided an opportunity to ask questions and all were answered. The patient agreed with the plan and demonstrated an understanding of the instructions.   The patient was advised to call back or seek an in-person evaluation if the symptoms worsen or if the condition fails to improve as anticipated.   CMA or LPN served as scribe during this visit. History, Physical, and Plan performed by medical provider. The above documentation has been reviewed and is accurate and complete.   Julian, [place] 09/07/2019

## 2019-09-08 ENCOUNTER — Telehealth: Payer: Self-pay | Admitting: Adult Health

## 2019-09-08 ENCOUNTER — Emergency Department (HOSPITAL_COMMUNITY)
Admission: EM | Admit: 2019-09-08 | Discharge: 2019-09-09 | Disposition: A | Discharge status: Other Acute Inpatient Hospital | Payer: Medicare Other | Attending: Emergency Medicine | Admitting: Emergency Medicine

## 2019-09-08 ENCOUNTER — Encounter: Payer: Self-pay | Admitting: Family Medicine

## 2019-09-08 ENCOUNTER — Ambulatory Visit: Payer: Medicare Other | Admitting: Psychology

## 2019-09-08 ENCOUNTER — Other Ambulatory Visit: Payer: Self-pay

## 2019-09-08 ENCOUNTER — Encounter (HOSPITAL_COMMUNITY): Payer: Self-pay | Admitting: Emergency Medicine

## 2019-09-08 DIAGNOSIS — Z20822 Contact with and (suspected) exposure to covid-19: Secondary | ICD-10-CM | POA: Insufficient documentation

## 2019-09-08 DIAGNOSIS — R45851 Suicidal ideations: Secondary | ICD-10-CM | POA: Insufficient documentation

## 2019-09-08 DIAGNOSIS — F3130 Bipolar disorder, current episode depressed, mild or moderate severity, unspecified: Secondary | ICD-10-CM | POA: Diagnosis present

## 2019-09-08 DIAGNOSIS — N183 Chronic kidney disease, stage 3 unspecified: Secondary | ICD-10-CM | POA: Insufficient documentation

## 2019-09-08 DIAGNOSIS — I129 Hypertensive chronic kidney disease with stage 1 through stage 4 chronic kidney disease, or unspecified chronic kidney disease: Secondary | ICD-10-CM | POA: Insufficient documentation

## 2019-09-08 LAB — CBC
HCT: 46 % (ref 36.0–46.0)
Hemoglobin: 14.4 g/dL (ref 12.0–15.0)
MCH: 29.1 pg (ref 26.0–34.0)
MCHC: 31.3 g/dL (ref 30.0–36.0)
MCV: 92.9 fL (ref 80.0–100.0)
Platelets: 220 10*3/uL (ref 150–400)
RBC: 4.95 MIL/uL (ref 3.87–5.11)
RDW: 13.1 % (ref 11.5–15.5)
WBC: 13 10*3/uL — ABNORMAL HIGH (ref 4.0–10.5)
nRBC: 0 % (ref 0.0–0.2)

## 2019-09-08 LAB — RAPID URINE DRUG SCREEN, HOSP PERFORMED
Amphetamines: NOT DETECTED
Barbiturates: NOT DETECTED
Benzodiazepines: POSITIVE — AB
Cocaine: NOT DETECTED
Opiates: NOT DETECTED
Tetrahydrocannabinol: NOT DETECTED

## 2019-09-08 LAB — COMPREHENSIVE METABOLIC PANEL
ALT: 16 U/L (ref 0–44)
AST: 16 U/L (ref 15–41)
Albumin: 4.1 g/dL (ref 3.5–5.0)
Alkaline Phosphatase: 128 U/L — ABNORMAL HIGH (ref 38–126)
Anion gap: 11 (ref 5–15)
BUN: 33 mg/dL — ABNORMAL HIGH (ref 8–23)
CO2: 21 mmol/L — ABNORMAL LOW (ref 22–32)
Calcium: 9.2 mg/dL (ref 8.9–10.3)
Chloride: 108 mmol/L (ref 98–111)
Creatinine, Ser: 2.03 mg/dL — ABNORMAL HIGH (ref 0.44–1.00)
GFR calc Af Amer: 28 mL/min — ABNORMAL LOW (ref >60)
GFR calc non Af Amer: 24 mL/min — ABNORMAL LOW (ref >60)
Glucose, Bld: 96 mg/dL (ref 70–99)
Potassium: 4.5 mmol/L (ref 3.5–5.1)
Sodium: 140 mmol/L (ref 135–145)
Total Bilirubin: 0.8 mg/dL (ref 0.3–1.2)
Total Protein: 7.7 g/dL (ref 6.5–8.1)

## 2019-09-08 LAB — ACETAMINOPHEN LEVEL: Acetaminophen (Tylenol), Serum: <10 ug/mL — ABNORMAL LOW (ref 10–30)

## 2019-09-08 LAB — ETHANOL: Alcohol, Ethyl (B): <10 mg/dL (ref <10)

## 2019-09-08 LAB — SARS CORONAVIRUS 2 BY RT PCR (HOSPITAL ORDER, PERFORMED IN ~~LOC~~ HOSPITAL LAB): SARS Coronavirus 2: NEGATIVE

## 2019-09-08 LAB — SALICYLATE LEVEL: Salicylate Lvl: <7 mg/dL — ABNORMAL LOW (ref 7.0–30.0)

## 2019-09-08 MED ORDER — CALCITRIOL 0.25 MCG PO CAPS: 0.2500 ug | ORAL_CAPSULE | ORAL | Status: DC

## 2019-09-08 MED ORDER — LEVETIRACETAM 250 MG PO TABS
250.0000 mg | ORAL_TABLET | Freq: Two times a day (BID) | ORAL | Status: DC
  Administered 2019-09-08 – 2019-09-09 (×2): 250 mg via ORAL
  Filled 2019-09-08 (×2): qty 1

## 2019-09-08 MED ORDER — LORAZEPAM 1 MG PO TABS
1.0000 mg | ORAL_TABLET | Freq: Two times a day (BID) | ORAL | Status: DC | PRN
  Administered 2019-09-08 – 2019-09-09 (×3): 1 mg via ORAL
  Filled 2019-09-08 (×3): qty 1

## 2019-09-08 MED ORDER — LAMOTRIGINE 25 MG PO TABS
150.0000 mg | ORAL_TABLET | Freq: Every day | ORAL | Status: DC
  Administered 2019-09-08: 150 mg via ORAL
  Filled 2019-09-08: qty 2

## 2019-09-08 MED ORDER — HYDROXYZINE HCL 10 MG PO TABS
10.0000 mg | ORAL_TABLET | Freq: Three times a day (TID) | ORAL | Status: DC | PRN
  Filled 2019-09-08 (×2): qty 1

## 2019-09-08 MED ORDER — SODIUM BICARBONATE 650 MG PO TABS
650.0000 mg | ORAL_TABLET | Freq: Two times a day (BID) | ORAL | Status: DC
  Administered 2019-09-08 – 2019-09-09 (×2): 650 mg via ORAL
  Filled 2019-09-08 (×3): qty 1

## 2019-09-08 MED ORDER — LEVOTHYROXINE SODIUM 112 MCG PO TABS
112.0000 ug | ORAL_TABLET | Freq: Every day | ORAL | Status: DC
  Administered 2019-09-09: 112 ug via ORAL
  Filled 2019-09-08: qty 1

## 2019-09-08 MED ORDER — AMLODIPINE BESYLATE 5 MG PO TABS
2.5000 mg | ORAL_TABLET | Freq: Every day | ORAL | Status: DC
  Administered 2019-09-09: 2.5 mg via ORAL
  Filled 2019-09-08: qty 1

## 2019-09-08 NOTE — ED Provider Notes (Signed)
[facility] DEPT Provider Note   CSN: [id number] Arrival date & time: 09/08/19  1257     History No chief complaint on file.   Tiffany [name] is a 69 y.o. female who presents with a cc of suicidal ideation.  She has past medical history of rapid cycling bipolar 1 disorder.  Patient has been to the ER a few times recently.  She states that she has been trying to treat her symptoms at home with Ativan which seemed to interrupt her bouts of severe depression.  She has been having bouts of suicidal ideation with plan to overdose on medicine.  She states that it certain times of day she is very ready to do it.  That is when she tries to take an Ativan and lay down.  Patient had a recent telemetry psych evaluation and was told that her symptoms likely need inpatient management.  She is here voluntarily.  She denies homicidal ideation or audiovisual hallucinations.  She has a previous history of psychiatric hospitalizations for severe depression and suicidal ideation/attempt.  HPI     Past Medical History:  Diagnosis Date  . Bipolar 1 disorder ([place])   . Breast cancer ([place])   . Chronic diarrhea    loose stools twice a day on average for years  . Chronic kidney disease (CKD), stage III (moderate)   . Depression 1987  . Malignant neoplasm of overlapping sites of left breast in female, estrogen receptor positive ([place]) 03/14/2016   Dx in 09/2014, s/p bilateral mastectomies and ALND, 0/10 LN. 1.4 cm  Grade I invasive lobular, ER and PR +/Her--, Ki 67 <5% Tried anastrozole for one month, but developed suicidal idea  . Parkinson's disease ([place]) 2012  . Psoriatic arthritis ([place])   . PTSD (post-traumatic stress disorder)   . Secondary hyperparathyroidism ([employer])   . Tardive dyskinesia     Patient Active Problem List   Diagnosis Date Noted  . Chronic diarrhea 05/05/2019  . Bipolar I disorder, most recent episode depressed ([place])   . Transaminitis   . Essential  hypertension   . Seizures ([place])   . Encephalopathy 04/26/2019  . Leukocytosis 04/21/2019  . Thrombocytopenia ([place]) 09/04/2018  . Vitamin D deficiency 09/04/2018  . Psoriatic arthritis ([place]) 09/04/2018  . Tubular adenoma of colon 05/28/2017  . History of colonic polyps 05/28/2017  . 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 Malignant neoplasm of overlapping sites of left breast in female, estrogen receptor positive ([place]) 03/14/2016  . Chronic kidney disease, stage III (moderate) 01/19/2016  . Tardive dyskinesia 10/18/2015  . History of breast cancer left 2016 12/27/2014  . Osteopenia determined by x-ray 10/31/2014  . Rosacea 05/21/2007  . Bipolar affective disorder, mixed ([place]) 08/16/2004  . Acquired hypothyroidism 04/03/1996    Past Surgical History:  Procedure Laterality Date  . ABDOMINAL HYSTERECTOMY  1987  . CHOLECYSTECTOMY  1979  . DILATION AND CURETTAGE OF UTERUS  1973  . MASTECTOMY Bilateral 09/19/2014  . TONSILLECTOMY  1970  . URETERAL REIMPLANTION Bilateral 1974     OB History   No obstetric history on file.     Family History  Problem Relation Age of Onset  . Cancer Mother   . Cancer Sister   . Stroke Maternal Grandfather   . Diabetes Paternal Grandfather   . Cancer Sister     Social History   Tobacco Use  . Smoking status: Never Smoker  . Smokeless tobacco: Never Used  Vaping Use  .  Vaping Use: Never used  Substance Use Topics  . Alcohol use: Never  . Drug use: Never    Home Medications Prior to Admission medications   Medication Sig Start Date End Date Taking? Authorizing Provider  acetaminophen (TYLENOL) 500 MG tablet Take 1,000 mg by mouth every 6 (six) hours as needed for mild pain or headache.   Yes [provider]  amLODipine (NORVASC) 2.5 MG tablet Take 1 tablet (2.5 mg total) by mouth daily. 05/31/19  Yes Leamon Arnt, MD  calcitRIOL (ROCALTROL) 0.25 MCG capsule Take 0.25 mcg by mouth every  Monday, Wednesday, and Friday.  06/18/18  Yes [provider]  Certolizumab Pegol (CIMZIA [place]) Inject 200 mg into the skin every 30 (thirty) days.   Yes [provider]  Cholecalciferol 50 MCG (2000 UT) TABS Take 1 tablet (2,000 Units total) by mouth See admin instructions. Take 1 tablet by mouth daily Patient taking differently: Take 1 tablet by mouth daily.  05/05/19  Yes Love, Ivan Anchors, PA-C  lamoTRIgine (LAMICTAL) 150 MG tablet Take 1 tablet (150 mg total) by mouth at bedtime. 09/06/19  Yes Mozingo, Berdie Ogren, NP  levETIRAcetam (KEPPRA) 250 MG tablet TAKE 1 TABLET(250 MG) BY MOUTH TWICE DAILY Patient taking differently: Take 250 mg by mouth 2 (two) times daily.  06/07/19  Yes Cameron Sprang, MD  levothyroxine (SYNTHROID) 112 MCG tablet Take 1 tablet (112 mcg total) by mouth daily at 6 (six) AM. 05/31/19  Yes Leamon Arnt, MD  LORazepam (ATIVAN) 1 MG tablet Take 1 tablet (1 mg total) by mouth 2 (two) times daily. 07/07/19  Yes Mozingo, Berdie Ogren, NP  sodium bicarbonate 650 MG tablet Take 1 tablet (650 mg total) by mouth 2 (two) times daily. 05/05/19  Yes Love, Ivan Anchors, PA-C  doxycycline (VIBRA-TABS) 100 MG tablet Take 1 tablet (100 mg total) by mouth 2 (two) times daily. Patient not taking: Reported on 09/07/2019 08/24/19   Inda Coke, PA  hydrOXYzine (ATARAX/VISTARIL) 10 MG tablet Take 1 tablet (10 mg total) by mouth 3 (three) times daily as needed for anxiety. 09/05/19   Patrecia Pour, NP  loperamide (IMODIUM) 2 MG capsule Take 1 capsule (2 mg total) by mouth as needed for diarrhea or loose stools. Patient not taking: Reported on 09/07/2019 04/26/19   Niel Hummer A, MD    Allergies    Aripiprazole, Lactose intolerance (gi), Methotrexate, Cefdinir, Etanercept, Exemestane, Fluoxetine, Methylprednisolone sodium succ, Epinephrine, and Nitrofurantoin  Review of Systems   Review of Systems Ten systems reviewed and are negative for acute change, except as noted  in the HPI.   Physical Exam Updated Vital Signs BP (!) 151/85   Pulse 77   Temp 97.9 F (36.6 C) (Oral)   Resp 16   Ht 5\' 4"  (1.626 m)   Wt 93 kg   SpO2 100%   BMI 35.19 kg/m   Physical Exam Vitals and nursing note reviewed.  Constitutional:      General: She is not in acute distress.    Appearance: She is well-developed. She is not diaphoretic.  HENT:     Head: Normocephalic and atraumatic.  Eyes:     General: No scleral icterus.    Conjunctiva/sclera: Conjunctivae normal.  Cardiovascular:     Rate and Rhythm: Normal rate and regular rhythm.     Heart sounds: Normal heart sounds. No murmur heard.  No friction rub. No gallop.   Pulmonary:     Effort: Pulmonary effort is normal. No respiratory  distress.     Breath sounds: Normal breath sounds.  Abdominal:     General: Bowel sounds are normal. There is no distension.     Palpations: Abdomen is soft. There is no mass.     Tenderness: There is no abdominal tenderness. There is no guarding.  Musculoskeletal:     Cervical back: Normal range of motion.  Skin:    General: Skin is warm and dry.  Neurological:     Mental Status: She is alert and oriented to person, place, and time.  Psychiatric:        Attention and Perception: Attention normal.        Mood and Affect: Affect is tearful.        Speech: Speech normal.        Behavior: Behavior normal. Behavior is cooperative.        Thought Content: Thought content includes suicidal ideation. Thought content includes suicidal plan.     ED Results / Procedures / Treatments   Labs (all labs ordered are listed, but only abnormal results are displayed) Labs Reviewed  COMPREHENSIVE METABOLIC PANEL - Abnormal; Notable for the following components:      Result Value   CO2 21 (*)    BUN 33 (*)    Creatinine, Ser 2.03 (*)    Alkaline Phosphatase 128 (*)    GFR calc non Af Amer 24 (*)    GFR calc Af Amer 28 (*)    All other components within normal limits  SALICYLATE LEVEL  - Abnormal; Notable for the following components:   Salicylate Lvl <4.5 (*)    All other components within normal limits  ACETAMINOPHEN LEVEL - Abnormal; Notable for the following components:   Acetaminophen (Tylenol), Serum <10 (*)    All other components within normal limits  CBC - Abnormal; Notable for the following components:   WBC 13.0 (*)    All other components within normal limits  RAPID URINE DRUG SCREEN, HOSP PERFORMED - Abnormal; Notable for the following components:   Benzodiazepines POSITIVE (*)    All other components within normal limits  SARS CORONAVIRUS 2 BY RT PCR (HOSPITAL ORDER, [facility])  ETHANOL    EKG None  Radiology No results found.  Procedures Procedures (including critical care time)  Medications Ordered in ED Medications  amLODipine (NORVASC) tablet 2.5 mg (2.5 mg Oral Not Given 09/08/19 1650)  calcitRIOL (ROCALTROL) capsule 0.25 mcg (has no administration in time range)  hydrOXYzine (ATARAX/VISTARIL) tablet 10 mg (has no administration in time range)  lamoTRIgine (LAMICTAL) tablet 150 mg (has no administration in time range)  levETIRAcetam (KEPPRA) tablet 250 mg (250 mg Oral Given 09/08/19 1653)  levothyroxine (SYNTHROID) tablet 112 mcg (has no administration in time range)  LORazepam (ATIVAN) tablet 1 mg (has no administration in time range)  sodium bicarbonate tablet 650 mg (650 mg Oral Given 09/08/19 1837)    ED Course  I have reviewed the triage vital signs and the nursing notes.  Pertinent labs & imaging results that were available during my care of the patient were reviewed by me and considered in my medical decision making (see chart for details).    MDM Rules/Calculators/A&P                          Patient is medically clear for PSych evaluation.  Final Clinical Impression(s) / ED Diagnoses Final diagnoses:  Suicidal intent    Rx / DC  Orders ED Discharge Orders    None       Ned Grace 09/08/19 2042    Dorie Rank, MD 09/10/19 [phone]

## 2019-09-08 NOTE — Telephone Encounter (Signed)
Pt called to report she will be checking herself in [facility] this evening. Has apts today that's why will be the evening before she checks in. Any questions call pt [phone]

## 2019-09-08 NOTE — ED Notes (Signed)
TTS ASSESSMENT COMPLETED. Talbot Grumbling, NP, patient meets inpatient criteria. TTS to secure placement.

## 2019-09-08 NOTE — Telephone Encounter (Signed)
Noted  

## 2019-09-08 NOTE — ED Triage Notes (Signed)
Patient reports feeling SI, states she has a plan to overdose on ativan and benadryl. Was seen and evaluated here recently, would like [facility].

## 2019-09-08 NOTE — Telephone Encounter (Signed)
Pt called back to report the increase in Lamictal from 100 mg to 150 mg she did have a lot of itching last night. No rash and not itching today.

## 2019-09-08 NOTE — BH Assessment (Signed)
Comprehensive Clinical Assessment (CCA) Screening, Triage and Referral Note  09/08/2019 Tiffany [name] Cancel [id number]  Patient presenting to [facility] due to [place] with plan to overdose on ativan and benedryl. Patient was seen by psychiatry on 09/04/19 for same presentation and was initially recommended for inpatient treatment, however patient was seen by psychiatry that next morning and discharged. Patient was seen by her psychiatrist at [facility], Ilda Mori today whom felt that patient needs to be managed in inpatient care due to the extreme up and down changes in mood. Patient reported she would have mood swings of being okay in the AM and then SI with plan in the evening. Patient and husband reported the past 2 days has been the worse regarding patients suicidal thoughts. Patient reported ativan was helping with worsening anxiety but felt that her suicidal ideations continues to worsen.   Disposition: Talbot Grumbling, NP, patient meets inpatient criteria. TTS to secure placement.   TTS ASSESSEMENT COMPLETED ON 6/28: Pt presenting to the [facility] due to SI with plan to overdose. Pt reported onset 3 weeks ago, on and off. Pt reported stressors are her "bipolar condition" and family relationships. Pt reported being stressed after moving to [place], during pandemic having to isolate and not having friends or family. Pt reported due to having COVID in the past, hospitalized for 3.5 weeks and being taken off psych meds, pt stated "I am more super sensitive to stimulus and noise, I am always on edge and agitated, I can't cope with choices". Pt denied HI, psychosis and drug/alcohol usage. Pt has very supported husband and 4 adult children. Pt is receiving medication management at [facility]. Pt was pleasant, cooperative and goal oriented regarding needed inpatient and outpatient resources needed.     Visit Diagnosis:    ICD-10-CM   1. Suicidal intent  R45.851     Patient Reported Information How did you hear about [place]?  Other (Comment)   Referral name: Dr. Ilda Mori, psychiatrist   Referral phone number: No data recorded Whom do you see for routine medical problems? Primary Care   Practice/Facility Name: [facility]   Practice/Facility Phone Number: No data recorded  Name of Contact: Dr. Billey Chang   Contact Number: No data recorded  Contact Fax Number: No data recorded  Prescriber Name: No data recorded  Prescriber Address (if known): No data recorded What Is the Reason for Your Visit/Call Today? SI with plan to overdose.  How Long Has This Been Causing You Problems? 1 wk - 1 month  Have You Recently Been in Any Inpatient Treatment (Hospital/Detox/Crisis Center/28-Day Program)? Yes   Name/Location of Program/Hospital:[facility]   How Long Were You There? overnight 1 night   When Were You Discharged? 09/05/19  Have You Ever Received Services From [facility] Before? Yes   Who [facility] See at [facility]? Dr. Billey Chang, PCP  Have You Recently Had Any Thoughts About Hurting Yourself? Yes   Are You Planning to Commit Suicide/Harm Yourself At This time?  Yes  Have you Recently Had Thoughts About Hurting Someone Tiffany [name]? No   Explanation: No data recorded Have You Used Any Alcohol or Drugs in the Past 24 Hours? No   How Long Ago Did You Use Drugs or Alcohol?  No data recorded  What Did You Use and How Much? No data recorded What Do You Feel Would Help You the Most Today? Other (Comment) (inpatient)  Do You Currently Have a Therapist/Psychiatrist? Yes   Name of Therapist/Psychiatrist: Dr. Ilda Mori  Have You Been Recently Discharged From Any Office Practice or Programs? No   Explanation of Discharge From Practice/Program:  No data recorded    CCA Screening Triage Referral Assessment Type of Contact: Tele-Assessment   Is this Initial or Reassessment? Initial Assessment   Date Telepsych consult ordered in [facility]:  No data recorded  Time Telepsych consult ordered in [facility]:  No  data recorded Patient Reported Information Reviewed? Yes   Patient Left Without Being Seen? No data recorded  Reason for Not Completing Assessment: No data recorded Collateral Involvement: [name], husband, was present, patient gave consent to speak with husband  Does Patient Have a Stage manager Guardian? No data recorded  Name and Contact of Legal Guardian:  No data recorded If Minor and Not Living with Parent(s), Who has Custody? No data recorded Is CPS involved or ever been involved? Never  Is APS involved or ever been involved? Never  Patient Determined To Be At Risk for Harm To Self or Others Based on Review of Patient Reported Information or Presenting Complaint? Yes, for Self-Harm   Method: No data recorded  Availability of Means: No data recorded  Intent: No data recorded  Notification Required: No data recorded  Additional Information for Danger to Others Potential:  No data recorded  Additional Comments for Danger to Others Potential:  No data recorded  Are There Guns or Other Weapons in Your Home?  No data recorded   Types of Guns/Weapons: No data recorded   Are These Weapons Safely Secured?                              No data recorded   Who Could Verify You Are Able To Have These Secured:    No data recorded Do You Have any Outstanding Charges, Pending Court Dates, Parole/Probation? No data recorded Contacted To Inform of Risk of Harm To Self or Others: Family/Significant Other:  Location of Assessment: [facility]  Does Patient Present under Involuntary Commitment? No   IVC Papers Initial File Date: No data recorded  [place] of Residence: [place]  Patient Currently Receiving the Following Services: Medication Management   Determination of Need: Emergent (2 hours)   Options For Referral: Inpatient Hospitalization   [name], [facility]

## 2019-09-08 NOTE — ED Notes (Signed)
Pt ambulatory to RR with walker.

## 2019-09-08 NOTE — Telephone Encounter (Signed)
Noted thank you

## 2019-09-08 NOTE — Telephone Encounter (Signed)
Called and spoke with patient - [facility]

## 2019-09-09 ENCOUNTER — Inpatient Hospital Stay
Admission: RE | Admit: 2019-09-09 | Discharge: 2019-09-14 | DRG: 885 | Disposition: A | Discharge status: Home / Self Care | Payer: Medicare Other | Source: Intra-hospital | Attending: Psychiatry | Admitting: Psychiatry

## 2019-09-09 DIAGNOSIS — G4700 Insomnia, unspecified: Secondary | ICD-10-CM | POA: Diagnosis present

## 2019-09-09 DIAGNOSIS — Z170 Estrogen receptor positive status [ER+]: Secondary | ICD-10-CM | POA: Diagnosis not present

## 2019-09-09 DIAGNOSIS — L4050 Arthropathic psoriasis, unspecified: Secondary | ICD-10-CM | POA: Diagnosis present

## 2019-09-09 DIAGNOSIS — Z8616 Personal history of COVID-19: Secondary | ICD-10-CM

## 2019-09-09 DIAGNOSIS — I10 Essential (primary) hypertension: Secondary | ICD-10-CM | POA: Diagnosis present

## 2019-09-09 DIAGNOSIS — I129 Hypertensive chronic kidney disease with stage 1 through stage 4 chronic kidney disease, or unspecified chronic kidney disease: Secondary | ICD-10-CM | POA: Diagnosis present

## 2019-09-09 DIAGNOSIS — F332 Major depressive disorder, recurrent severe without psychotic features: Secondary | ICD-10-CM | POA: Diagnosis present

## 2019-09-09 DIAGNOSIS — F3160 Bipolar disorder, current episode mixed, unspecified: Secondary | ICD-10-CM | POA: Diagnosis present

## 2019-09-09 DIAGNOSIS — G40909 Epilepsy, unspecified, not intractable, without status epilepticus: Secondary | ICD-10-CM | POA: Diagnosis present

## 2019-09-09 DIAGNOSIS — N183 Chronic kidney disease, stage 3 unspecified: Secondary | ICD-10-CM | POA: Diagnosis present

## 2019-09-09 DIAGNOSIS — G20 Parkinson's disease: Secondary | ICD-10-CM | POA: Diagnosis present

## 2019-09-09 DIAGNOSIS — R45851 Suicidal ideations: Secondary | ICD-10-CM | POA: Diagnosis present

## 2019-09-09 DIAGNOSIS — F3130 Bipolar disorder, current episode depressed, mild or moderate severity, unspecified: Secondary | ICD-10-CM | POA: Diagnosis not present

## 2019-09-09 MED ORDER — HYDROXYZINE HCL 10 MG PO TABS
10.0000 mg | ORAL_TABLET | Freq: Three times a day (TID) | ORAL | Status: DC | PRN
  Administered 2019-09-09 – 2019-09-10 (×2): 10 mg via ORAL
  Filled 2019-09-09 (×2): qty 1

## 2019-09-09 MED ORDER — ACETAMINOPHEN 325 MG PO TABS
650.0000 mg | ORAL_TABLET | Freq: Four times a day (QID) | ORAL | Status: DC | PRN
  Administered 2019-09-14: 650 mg via ORAL
  Filled 2019-09-09: qty 2

## 2019-09-09 MED ORDER — SODIUM BICARBONATE 650 MG PO TABS
650.0000 mg | ORAL_TABLET | Freq: Two times a day (BID) | ORAL | Status: DC
  Administered 2019-09-10 – 2019-09-14 (×9): 650 mg via ORAL
  Filled 2019-09-09 (×11): qty 1

## 2019-09-09 MED ORDER — LEVOTHYROXINE SODIUM 112 MCG PO TABS
112.0000 ug | ORAL_TABLET | Freq: Every day | ORAL | Status: DC
  Administered 2019-09-10 – 2019-09-14 (×5): 112 ug via ORAL
  Filled 2019-09-09 (×6): qty 1

## 2019-09-09 MED ORDER — ALUM & MAG HYDROXIDE-SIMETH 200-200-20 MG/5ML PO SUSP: 30.0000 mL | ORAL | Status: DC | PRN

## 2019-09-09 MED ORDER — LEVETIRACETAM 250 MG PO TABS
250.0000 mg | ORAL_TABLET | Freq: Two times a day (BID) | ORAL | Status: DC
  Administered 2019-09-10 – 2019-09-14 (×9): 250 mg via ORAL
  Filled 2019-09-09 (×11): qty 1

## 2019-09-09 MED ORDER — LORAZEPAM 1 MG PO TABS
1.0000 mg | ORAL_TABLET | Freq: Two times a day (BID) | ORAL | Status: DC | PRN
  Administered 2019-09-09: 1 mg via ORAL
  Filled 2019-09-09: qty 1

## 2019-09-09 MED ORDER — AMLODIPINE BESYLATE 5 MG PO TABS
2.5000 mg | ORAL_TABLET | Freq: Every day | ORAL | Status: DC
  Administered 2019-09-10 – 2019-09-14 (×5): 2.5 mg via ORAL
  Filled 2019-09-09 (×5): qty 1

## 2019-09-09 MED ORDER — MAGNESIUM HYDROXIDE 400 MG/5ML PO SUSP: 30.0000 mL | Freq: Every day | ORAL | Status: DC | PRN

## 2019-09-09 MED ORDER — LAMOTRIGINE 25 MG PO TABS
150.0000 mg | ORAL_TABLET | Freq: Every day | ORAL | Status: DC
  Administered 2019-09-09: 150 mg via ORAL
  Filled 2019-09-09: qty 1

## 2019-09-09 MED ORDER — CALCITRIOL 0.25 MCG PO CAPS
0.2500 ug | ORAL_CAPSULE | ORAL | Status: DC
  Administered 2019-09-10 – 2019-09-13 (×2): 0.25 ug via ORAL
  Filled 2019-09-09 (×2): qty 1

## 2019-09-09 NOTE — BH Assessment (Signed)
Patient has been accepted to [facility].  Accepting physician is Dr. Weber Cooks.  Attending  Physician will be Dr.[name]  Patient has been assigned to room 310, by [facility]   Call report to [phone].  Representative/Transfer Coordinator is Brita Romp, TTS Patient pre-admitted by [facility] Patient Access Lutricia Feil)  [facility] ER Staff Gershon Mussel) made aware of acceptance.

## 2019-09-09 NOTE — BH Assessment (Signed)
[facility] Assessment Progress Note  Per Letitia Libra, FNP, this pt requires psychiatric hospitalization.  Jasmine Faulcon, LCAS reports that pt has been accepted to [facility] by Dr Weber Cooks to Rm 310. Pt has signed Voluntary Admission and Consent for Treatment, as well as Consent to Release Information to Jeoffrey Massed, DNP at [facility], as well as her PCP and her husband, and a notification call has been placed to the psychiatry provider.  Signed forms have been faxed to [phone]. Pt's nurse, Nena Jordan, has been notified, and agrees to call report to [phone]. Pt is to be transported via [facility].   Jalene Mullet, [place] Coordinator [phone]

## 2019-09-09 NOTE — Progress Notes (Signed)
Report attempted to [facility]. Do to influx of patient's, [facility] RN requested patient wait until next shift to transfer. Call report at [place].

## 2019-09-09 NOTE — ED Notes (Signed)
[facility] called. Transportation arranged.

## 2019-09-10 ENCOUNTER — Other Ambulatory Visit: Payer: Self-pay

## 2019-09-10 ENCOUNTER — Encounter: Payer: Self-pay | Admitting: Psychiatry

## 2019-09-10 DIAGNOSIS — F3160 Bipolar disorder, current episode mixed, unspecified: Principal | ICD-10-CM

## 2019-09-10 LAB — COMPREHENSIVE METABOLIC PANEL
ALT: 15 U/L (ref 0–44)
AST: 19 U/L (ref 15–41)
Albumin: 4 g/dL (ref 3.5–5.0)
Alkaline Phosphatase: 125 U/L (ref 38–126)
Anion gap: 12 (ref 5–15)
BUN: 31 mg/dL — ABNORMAL HIGH (ref 8–23)
CO2: 24 mmol/L (ref 22–32)
Calcium: 9.4 mg/dL (ref 8.9–10.3)
Chloride: 106 mmol/L (ref 98–111)
Creatinine, Ser: 2.08 mg/dL — ABNORMAL HIGH (ref 0.44–1.00)
GFR calc Af Amer: 27 mL/min — ABNORMAL LOW (ref >60)
GFR calc non Af Amer: 24 mL/min — ABNORMAL LOW (ref >60)
Glucose, Bld: 133 mg/dL — ABNORMAL HIGH (ref 70–99)
Potassium: 4.6 mmol/L (ref 3.5–5.1)
Sodium: 142 mmol/L (ref 135–145)
Total Bilirubin: 1.3 mg/dL — ABNORMAL HIGH (ref 0.3–1.2)
Total Protein: 7.9 g/dL (ref 6.5–8.1)

## 2019-09-10 LAB — LIPID PANEL
Cholesterol: 193 mg/dL (ref 0–200)
HDL: 56 mg/dL (ref >40)
LDL Cholesterol: 118 mg/dL — ABNORMAL HIGH (ref 0–99)
Total CHOL/HDL Ratio: 3.4 RATIO
Triglycerides: 96 mg/dL (ref <150)
VLDL: 19 mg/dL (ref 0–40)

## 2019-09-10 MED ORDER — LORAZEPAM 1 MG PO TABS
1.0000 mg | ORAL_TABLET | Freq: Every evening | ORAL | Status: DC | PRN
  Administered 2019-09-10 – 2019-09-13 (×4): 1 mg via ORAL
  Filled 2019-09-10 (×4): qty 1

## 2019-09-10 MED ORDER — LORAZEPAM 1 MG PO TABS
0.5000 mg | ORAL_TABLET | Freq: Four times a day (QID) | ORAL | Status: DC | PRN
  Administered 2019-09-11 – 2019-09-13 (×3): 0.5 mg via ORAL
  Filled 2019-09-10 (×3): qty 1

## 2019-09-10 MED ORDER — LAMOTRIGINE 100 MG PO TABS
200.0000 mg | ORAL_TABLET | Freq: Every day | ORAL | Status: DC
  Administered 2019-09-10 – 2019-09-13 (×4): 200 mg via ORAL
  Filled 2019-09-10 (×4): qty 2

## 2019-09-10 MED ORDER — LOPERAMIDE HCL 2 MG PO CAPS
2.0000 mg | ORAL_CAPSULE | ORAL | Status: DC | PRN
  Administered 2019-09-10 – 2019-09-11 (×2): 2 mg via ORAL
  Filled 2019-09-10 (×3): qty 1

## 2019-09-10 NOTE — [facility]HH Suicide Risk Assessment (Signed)
[facility] Admission Suicide Risk Assessment   Nursing information obtained from:  Patient Demographic factors:  NA Current Mental Status:  NA Loss Factors:  NA Historical Factors:  NA Risk Reduction Factors:  NA  Total Time spent with patient: 1 hour Principal Problem: Bipolar affective disorder, mixed ([place]) Diagnosis:  Principal Problem:   Bipolar affective disorder, mixed ([place]) Active Problems:   Psoriatic arthritis ([place])   Essential hypertension   Severe recurrent major depression without psychotic features ([place])  Subjective Data: Patient seen chart reviewed.  Patient with a history of bipolar disorder.  Recently with anxiety and depression.  Passive suicidal thoughts.  Had ideas of overdosing on medicine but did not act on it and has no intention currently to act on it.  No psychotic symptoms.  Good insight and cooperative with treatment.  Continued Clinical Symptoms:  Alcohol Use Disorder Identification Test Final Score (AUDIT): 0 The "Alcohol Use Disorders Identification Test", Guidelines for Use in Primary Care, Second Edition.  World Pharmacologist [facility]). Score between 0-7:  no or low risk or alcohol related problems. Score between 8-15:  moderate risk of alcohol related problems. Score between 16-19:  high risk of alcohol related problems. Score 20 or above:  warrants further diagnostic evaluation for alcohol dependence and treatment.   CLINICAL FACTORS:   Bipolar Disorder:   Mixed State Depressive phase   Musculoskeletal: Strength & Muscle Tone: within normal limits Gait & Station: unsteady Patient leans: N/A  Psychiatric Specialty Exam: Physical Exam Vitals and nursing note reviewed.  Constitutional:      Appearance: She is well-developed.  HENT:     Head: Normocephalic and atraumatic.  Eyes:     Conjunctiva/sclera: Conjunctivae normal.     Pupils: Pupils are equal, round, and reactive to light.  Cardiovascular:     Heart sounds: Normal heart sounds.   Pulmonary:     Effort: Pulmonary effort is normal.  Abdominal:     Palpations: Abdomen is soft.  Musculoskeletal:        General: Normal range of motion.     Cervical back: Normal range of motion.     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  Neurological:     General: No focal deficit present.     Mental Status: She is alert.  Psychiatric:        Attention and Perception: Attention normal.        Mood and Affect: Mood is anxious and depressed.        Speech: Speech normal.        Behavior: Behavior normal.        Thought Content: Thought content normal.        Cognition and Memory: Cognition normal.        Judgment: Judgment normal.     Review of Systems  Constitutional: Negative.   HENT: Negative.   Eyes: Negative.   Respiratory: Negative.   Cardiovascular: Negative.   Gastrointestinal: Negative.   Musculoskeletal: Negative.   Skin: Negative.   Neurological: Negative.   Psychiatric/Behavioral: Positive for dysphoric mood and suicidal ideas. The patient is nervous/anxious.     Blood pressure 128/86, pulse 83, temperature 97.8 F (36.6 C), temperature source Oral, resp. rate 18, height 5\' 4"  (1.626 m), weight 93.9 kg, SpO2 97 %.Body mass index is 35.53 kg/m.  General Appearance: Casual  Eye Contact:  Good  Speech:  Clear and Coherent  Volume:  Normal  Mood:  Anxious and Dysphoric  Affect:  Appropriate  Thought Process:  Goal Directed  Orientation:  Full (Time, Place, and Person)  Thought Content:  Logical  Suicidal Thoughts:  Yes.  without intent/plan  Homicidal Thoughts:  No  Memory:  Immediate;   Fair Recent;   Fair Remote;   Fair  Judgement:  Fair  Insight:  Fair  Psychomotor Activity:  Normal  Concentration:  Concentration: Fair  Recall:  [employer] of Knowledge:  Fair  Language:  Fair  Akathisia:  No  Handed:  Right  AIMS (if indicated):     Assets:  Desire for  Improvement Physical Health Resilience Social Support  ADL's:  Intact  Cognition:  WNL  Sleep:  Number of Hours: 6      COGNITIVE FEATURES THAT CONTRIBUTE TO RISK:  None    SUICIDE RISK:   Mild:  Suicidal ideation of limited frequency, intensity, duration, and specificity.  There are no identifiable plans, no associated intent, mild dysphoria and related symptoms, good self-control (both objective and subjective assessment), few other risk factors, and identifiable protective factors, including available and accessible social support.  PLAN OF CARE: Continue 15-minute checks.  Reviewed options for treatment with patient.  Adjust psychiatric meds.  Reassess dangerousness in symptoms prior to discharge planning  I certify that inpatient services furnished can reasonably be expected to improve the patient's condition.   Alethia Berthold, MD 09/10/2019, 5:10 PM

## 2019-09-10 NOTE — Tx Team (Signed)
Initial Treatment Plan 09/10/2019 1:28 AM Pleas Koch Lonigro PLW:[id number]       PATIENT STRESSORS: Medication change or noncompliance   PATIENT STRENGTHS: Capable of independent living Financial means Supportive family/friends   PATIENT IDENTIFIED PROBLEMS: Need for medication change                      DISCHARGE CRITERIA:  Improved stabilization in mood, thinking, and/or behavior Motivation to continue treatment in a less acute level of care Verbal commitment to aftercare and medication compliance  PRELIMINARY DISCHARGE PLAN: Return to previous living arrangement  PATIENT/FAMILY INVOLVEMENT: This treatment plan has been presented to and reviewed with the patient, Tiffany [name].  The patient has been given the opportunity to ask questions and make suggestions.  Tiffany Leavy L Butler-Nicholson, LPN 06/12/6014, 5:80 AM

## 2019-09-10 NOTE — H&P (Signed)
Psychiatric Admission Assessment Adult  Patient Identification: Tiffany [name] MRN:  [id number] Date of Evaluation:  09/10/2019 Chief Complaint:  Severe recurrent major depression without psychotic features ([place]) [F33.2] Principal Diagnosis: Bipolar affective disorder, mixed ([place]) Diagnosis:  Principal Problem:   Bipolar affective disorder, mixed ([place]) Active Problems:   Psoriatic arthritis ([place])   Essential hypertension   Severe recurrent major depression without psychotic features ([place])  History of Present Illness: Patient seen chart reviewed.  69 year old woman with a history of bipolar disorder presented to the hospital because of worsening depression and anxiety.  Also spoke with patient's husband with her consent.  Both of them tell an identical history.  Patient had COVID in January.  During her hospitalization she was taken off of some of her psychiatric medicines because of some concern about mental status changes.  She had previously been on lithium Zoloft and 300 mg a day of lamotrigine.  She was cut down to just 100 mg a day of lamotrigine.  When she recovered from the virus she felt better than ever.  Mood was stable without any bipolar symptoms.  However starting just a few weeks ago the patient noticed her mood was getting worse.  She became anxious throughout the day with panicky-like symptoms.  She was waking up early at night irritable and anxious.  For the past week her mood has become angry irritated and depressed.  She admits to having entertained suicidal thoughts of overdosing but did not act on it and has no plans to act on it.  She denies any hallucinations or psychotic symptoms.  Denies any alcohol or drug use.  She has spoken with her primary psychiatric provider who has increased her lamotrigine from 100 mg to 150 mg just a couple days ago.  No change thus far. Associated Signs/Symptoms: Depression Symptoms:  depressed mood, insomnia, difficulty concentrating, suicidal  thoughts with specific plan, loss of energy/fatigue, disturbed sleep, (Hypo) Manic Symptoms:  Irritable Mood, Anxiety Symptoms:  Excessive Worry, Psychotic Symptoms:  Denies any psychotic symptoms PTSD Symptoms: Negative Total Time spent with patient: 1 hour  Past Psychiatric History: Patient has a longstanding history of bipolar disorder going back years.  Has had both depressed and manic symptoms in the past.  Was stable on lithium and lamotrigine combination for years.  Has had previous hospitalizations most recently 5 years ago.  Denies any past suicide attempts.  Reports that she had been particularly responsive to lithium but in recent years has developed some renal insufficiency and concern was raised about lithium being over sedating.  No history of substance abuse.  Patient recalls multiple medicine she has not tolerated well.  Abilify was poorly tolerated.  Prozac and other antidepressants in the past had induced mania.  Seroquel and Zyprexa are both remembered as being less than effective.  Is the patient at risk to self? Yes.    Has the patient been a risk to self in the past 6 months? No.  Has the patient been a risk to self within the distant past? Yes.    Is the patient a risk to others? No.  Has the patient been a risk to others in the past 6 months? No.  Has the patient been a risk to others within the distant past? No.   Prior Inpatient Therapy:   Prior Outpatient Therapy:    Alcohol Screening: 1. How often do you have a drink containing alcohol?: Never 2. How many drinks containing alcohol do you have on a typical  day when you are drinking?: 1 or 2 3. How often do you have six or more drinks on one occasion?: Never AUDIT-C Score: 0 4. How often during the last year have you found that you were not able to stop drinking once you had started?: Never 5. How often during the last year have you failed to do what was normally expected from you because of drinking?: Never 6.  How often during the last year have you needed a first drink in the morning to get yourself going after a heavy drinking session?: Never 7. How often during the last year have you had a feeling of guilt of remorse after drinking?: Never 8. How often during the last year have you been unable to remember what happened the night before because you had been drinking?: Never 9. Have you or someone else been injured as a result of your drinking?: No 10. Has a relative or friend or a doctor or another health worker been concerned about your drinking or suggested you cut down?: No Alcohol Use Disorder Identification Test Final Score (AUDIT): 0 Alcohol Brief Interventions/Follow-up: AUDIT Score <7 follow-up not indicated Substance Abuse History in the last 12 months:  No. Consequences of Substance Abuse: Negative Previous Psychotropic Medications: Yes  Psychological Evaluations: Yes  Past Medical History:  Past Medical History:  Diagnosis Date  . Bipolar 1 disorder ([place])   . Breast cancer ([place])   . Chronic diarrhea    loose stools twice a day on average for years  . Chronic kidney disease (CKD), stage III (moderate)   . Depression 1987  . Malignant neoplasm of overlapping sites of left breast in female, estrogen receptor positive ([place]) 03/14/2016   Dx in 09/2014, s/p bilateral mastectomies and ALND, 0/10 LN. 1.4 cm  Grade I invasive lobular, ER and PR +/Her--, Ki 67 <5% Tried anastrozole for one month, but developed suicidal idea  . Parkinson's disease ([place]) 2012  . Psoriatic arthritis ([place])   . PTSD (post-traumatic stress disorder)   . Secondary hyperparathyroidism ([place])   . Tardive dyskinesia     Past Surgical History:  Procedure Laterality Date  . ABDOMINAL HYSTERECTOMY  1987  . CHOLECYSTECTOMY  1979  . DILATION AND CURETTAGE OF UTERUS  1973  . MASTECTOMY Bilateral 09/19/2014  . TONSILLECTOMY  1970  . URETERAL REIMPLANTION Bilateral 1974   Family History:  Family History  Problem  Relation Age of Onset  . Cancer Mother   . Cancer Sister   . Stroke Maternal Grandfather   . Diabetes Paternal Grandfather   . Cancer Sister    Family Psychiatric  History: Patient says she believes her mother had bipolar disorder although it was apparently never diagnosed or treated Tobacco Screening: Have you used any form of tobacco in the last 30 days? (Cigarettes, Smokeless Tobacco, Cigars, and/or Pipes): No Social History:  Social History   Substance and Sexual Activity  Alcohol Use Never     Social History   Substance and Sexual Activity  Drug Use Never    Additional Social History: Marital status: Married Number of Years Married: 68 What types of issues is patient dealing with in the relationship?: None reported Does patient have children?: Yes How many children?: 3 How is patient's relationship with their children?: Pt reports having a good relationship with her son and daughter that lives in the home. Pt reports having strained relationship with daughter that lives in [place]  Allergies:   Allergies  Allergen Reactions  . Aripiprazole Other (See Comments)    Parkinsonism     . Lactose Intolerance (Gi) Diarrhea  . Methotrexate Other (See Comments)    Hair loss, severe stomatitis    . Cefdinir Diarrhea    Other reaction(s): Diarrhea Yeast infection and fever; negative c diff  . Etanercept Other (See Comments)    Headaches    . Exemestane Other (See Comments)    Suicidal thoughts with medication    . Fluoxetine Other (See Comments)    Parkinsonism  . Methylprednisolone Sodium Succ Other (See Comments)    Agitated mania  . Epinephrine Palpitations    tachycardia   . Nitrofurantoin Nausea And Vomiting and Rash        Lab Results:  Results for orders placed or performed during the hospital encounter of 09/09/19 (from the past 48 hour(s))  Lipid panel     Status: Abnormal   Collection Time: 09/10/19  8:51 AM  Result  Value Ref Range   Cholesterol 193 0 - 200 mg/dL   Triglycerides 96 <150 mg/dL   HDL 56 >40 mg/dL   Total CHOL/HDL Ratio 3.4 RATIO   VLDL 19 0 - 40 mg/dL   LDL Cholesterol 118 (H) 0 - 99 mg/dL    Comment:        Total Cholesterol/HDL:CHD Risk Coronary Heart Disease Risk Table                     Men   Women  1/2 Average Risk   3.4   3.3  Average Risk       5.0   4.4  2 X Average Risk   9.6   7.1  3 X Average Risk  23.4   11.0        Use the calculated Patient Ratio above and the CHD Risk Table to determine the patient's CHD Risk.        ATP III CLASSIFICATION (LDL):  <100     mg/dL   Optimal  100-129  mg/dL   Near or Above                    Optimal  130-159  mg/dL   Borderline  160-189  mg/dL   High  >190     mg/dL   Very High Performed at [facility], [address] [place], [place] [address]   Comprehensive metabolic panel     Status: Abnormal   Collection Time: 09/10/19  8:51 AM  Result Value Ref Range   Sodium 142 135 - 145 mmol/L   Potassium 4.6 3.5 - 5.1 mmol/L   Chloride 106 98 - 111 mmol/L   CO2 24 22 - 32 mmol/L   Glucose, Bld 133 (H) 70 - 99 mg/dL    Comment: Glucose reference range applies only to samples taken after fasting for at least 8 hours.   BUN 31 (H) 8 - 23 mg/dL   Creatinine, Ser 2.08 (H) 0.44 - 1.00 mg/dL   Calcium 9.4 8.9 - 10.3 mg/dL   Total Protein 7.9 6.5 - 8.1 g/dL   Albumin 4.0 3.5 - 5.0 g/dL   AST 19 15 - 41 U/L   ALT 15 0 - 44 U/L   Alkaline Phosphatase 125 38 - 126 U/L   Total Bilirubin 1.3 (H) 0.3 - 1.2 mg/dL   GFR calc non Af Amer 24 (L) >60 mL/min   GFR calc Af Amer 27 (  L) >60 mL/min   Anion gap 12 5 - 15    Comment: Performed at [facility], [address] [place], [place] [address]    Blood Alcohol level:  Lab Results  Component Value Date   ETH <10 09/08/2019   ETH <10 69/67/8938    Metabolic Disorder Labs:  No results found for: HGBA1C, MPG No results found for: PROLACTIN Lab Results   Component Value Date   CHOL 193 09/10/2019   TRIG 96 09/10/2019   HDL 56 09/10/2019   CHOLHDL 3.4 09/10/2019   VLDL 19 09/10/2019   LDLCALC 118 (H) 09/10/2019    Current Medications: Current Facility-Administered Medications  Medication Dose Route Frequency Provider Last Rate Last Admin  . acetaminophen (TYLENOL) tablet 650 mg  650 mg Oral Q6H PRN Kierstin January T, MD      . alum & mag hydroxide-simeth (MAALOX/MYLANTA) 200-200-20 MG/5ML suspension 30 mL  30 mL Oral Q4H PRN Frandy Basnett T, MD      . amLODipine (NORVASC) tablet 2.5 mg  2.5 mg Oral Daily Djon Tith, [name], MD   2.5 mg at 09/10/19 0936  . calcitRIOL (ROCALTROL) capsule 0.25 mcg  0.25 mcg Oral Q M,W,F Norrine Ballester T, MD   0.25 mcg at 09/10/19 1017  . hydrOXYzine (ATARAX/VISTARIL) tablet 10 mg  10 mg Oral TID PRN Mireille Lacombe, [name], MD   10 mg at 09/10/19 0937  . lamoTRIgine (LAMICTAL) tablet 200 mg  200 mg Oral QHS Tacia Hindley T, MD      . levETIRAcetam (KEPPRA) tablet 250 mg  250 mg Oral BID Malan Werk, [name], MD   250 mg at 09/10/19 0937  . levothyroxine (SYNTHROID) tablet 112 mcg  112 mcg Oral Q0600 Courtnie Brenes, [name], MD   112 mcg at 09/10/19 1105  . loperamide (IMODIUM) capsule 2 mg  2 mg Oral PRN Bonniejean Piano T, MD      . LORazepam (ATIVAN) tablet 0.5 mg  0.5 mg Oral Q6H PRN Rosha Cocker T, MD      . LORazepam (ATIVAN) tablet 1 mg  1 mg Oral QHS PRN Marriana Hibberd T, MD      . magnesium hydroxide (MILK OF MAGNESIA) suspension 30 mL  30 mL Oral Daily PRN Doug Bucklin T, MD      . sodium bicarbonate tablet 650 mg  650 mg Oral BID Bryttani Blew, [name], MD   650 mg at 09/10/19 5102   PTA Medications: Medications Prior to Admission  Medication Sig Dispense Refill Last Dose  . acetaminophen (TYLENOL) 500 MG tablet Take 1,000 mg by mouth every 6 (six) hours as needed for mild pain or headache.     Marland Kitchen amLODipine (NORVASC) 2.5 MG tablet Take 1 tablet (2.5 mg total) by mouth daily. 90 tablet 3   . calcitRIOL (ROCALTROL) 0.25 MCG capsule  Take 0.25 mcg by mouth every Monday, Wednesday, and Friday.      . Certolizumab Pegol (CIMZIA [place]) Inject 200 mg into the skin every 30 (thirty) days.     . Cholecalciferol 50 MCG (2000 UT) TABS Take 1 tablet (2,000 Units total) by mouth See admin instructions. Take 1 tablet by mouth daily (Patient taking differently: Take 1 tablet by mouth daily. ) 30 tablet 0   . doxycycline (VIBRA-TABS) 100 MG tablet Take 1 tablet (100 mg total) by mouth 2 (two) times daily. (Patient not taking: Reported on 09/07/2019) 20 tablet 0   . hydrOXYzine (ATARAX/VISTARIL) 10 MG tablet Take 1 tablet (10 mg total)  by mouth 3 (three) times daily as needed for anxiety. 30 tablet 0   . lamoTRIgine (LAMICTAL) 150 MG tablet Take 1 tablet (150 mg total) by mouth at bedtime. 90 tablet 0   . levETIRAcetam (KEPPRA) 250 MG tablet TAKE 1 TABLET(250 MG) BY MOUTH TWICE DAILY (Patient taking differently: Take 250 mg by mouth 2 (two) times daily. ) 180 tablet 3   . levothyroxine (SYNTHROID) 112 MCG tablet Take 1 tablet (112 mcg total) by mouth daily at 6 (six) AM. 90 tablet 3   . loperamide (IMODIUM) 2 MG capsule Take 1 capsule (2 mg total) by mouth as needed for diarrhea or loose stools. (Patient not taking: Reported on 09/07/2019) 30 capsule 0   . LORazepam (ATIVAN) 1 MG tablet Take 1 tablet (1 mg total) by mouth 2 (two) times daily. 60 tablet 2   . sodium bicarbonate 650 MG tablet Take 1 tablet (650 mg total) by mouth 2 (two) times daily. 60 tablet 0     Musculoskeletal: Strength & Muscle Tone: within normal limits Gait & Station: normal Patient leans: N/A  Psychiatric Specialty Exam: Physical Exam Constitutional:      Appearance: She is well-developed.  HENT:     Head: Normocephalic and atraumatic.  Eyes:     Conjunctiva/sclera: Conjunctivae normal.     Pupils: Pupils are equal, round, and reactive to light.  Cardiovascular:     Heart sounds: Normal heart sounds.  Pulmonary:     Effort: Pulmonary effort is normal.   Abdominal:     Palpations: Abdomen is soft.  Musculoskeletal:        General: Normal range of motion.     Cervical back: Normal range of motion.     Comments: Chronic arthritis of the hips causing a need for a walker  Skin:    General: Skin is warm and dry.  Neurological:     General: No focal deficit present.     Mental Status: She is alert.  Psychiatric:        Attention and Perception: Attention normal.        Mood and Affect: Mood is anxious and depressed.        Speech: Speech normal.        Behavior: Behavior normal.        Thought Content: Thought content includes suicidal ideation. Thought content does not include suicidal plan.        Cognition and Memory: Cognition normal.        Judgment: Judgment normal.     Review of Systems  Constitutional: Negative.   HENT: Negative.   Eyes: Negative.   Respiratory: Negative.   Cardiovascular: Negative.   Gastrointestinal: Negative.   Musculoskeletal: Negative.   Skin: Negative.   Neurological: Negative.   Psychiatric/Behavioral: Positive for dysphoric mood and suicidal ideas. The patient is nervous/anxious.     Blood pressure 128/86, pulse 83, temperature 97.8 F (36.6 C), temperature source Oral, resp. rate 18, height 5\' 4"  (1.626 m), weight 93.9 kg, SpO2 97 %.Body mass index is 35.53 kg/m.  General Appearance: Casual  Eye Contact:  Good  Speech:  Clear and Coherent  Volume:  Normal  Mood:  Euthymic  Affect:  Congruent  Thought Process:  Coherent  Orientation:  Full (Time, Place, and Person)  Thought Content:  Logical  Suicidal Thoughts:  Yes.  without intent/plan  Homicidal Thoughts:  No  Memory:  Immediate;   Fair Recent;   Fair Remote;   Fair  Judgement:  Fair  Insight:  Fair  Psychomotor Activity:  Normal  Concentration:  Concentration: Fair  Recall:  [employer] of Knowledge:  Fair  Language:  Fair  Akathisia:  No  Handed:  Right  AIMS (if indicated):     Assets:  Desire for  Improvement Housing Physical Health Resilience Social Support  ADL's:  Intact  Cognition:  WNL  Sleep:  Number of Hours: 6    Treatment Plan Summary: Daily contact with patient to assess and evaluate symptoms and progress in treatment, Medication management and Plan I reviewed with both the patient and her husband several medication options.  I recommended gentle increases in lamotrigine as the 1 least likely to cause any problems or side effects.  Lithium could potentially be restarted but given her renal function and previous sedation I suggest that not be our first line treatment.  Patient is agreeable.  Lamotrigine dose increased to 200 mg/day.  Continue other outpatient medicines including the lorazepam.  No history of substance abuse problems.  Lorazepam should be able to be used safely at a low dose of 1 mg at night and half milligram as needed for anxiety during the day.  Reassess daily [place] for possible discharge 2 to 3 days.  Observation Level/Precautions:  15 minute checks  Laboratory:  Chemistry Profile  Psychotherapy:    Medications:    Consultations:    Discharge Concerns:    Estimated LOS:  Other:     Physician Treatment Plan for Primary Diagnosis: Bipolar affective disorder, mixed ([place]) Long Term Goal(s): Improvement in symptoms so as ready for discharge  Short Term Goals: Ability to verbalize feelings will improve, Ability to disclose and discuss suicidal ideas and Ability to demonstrate self-control will improve  Physician Treatment Plan for Secondary Diagnosis: Principal Problem:   Bipolar affective disorder, mixed ([place]) Active Problems:   Psoriatic arthritis ([place])   Essential hypertension   Severe recurrent major depression without psychotic features ([place])  Long Term Goal(s): Improvement in symptoms so as ready for discharge  Short Term Goals: Compliance with prescribed medications will improve  I certify that inpatient services furnished can reasonably be  expected to improve the patient's condition.    Alethia Berthold, MD 7/2/20215:14 PM

## 2019-09-10 NOTE — BHH Counselor (Signed)
CSW spoke with receptionist at [facility] who reports a referral for a new in house clinician will need to come from Dr. Bess Kinds. Pt reports she has been meeting with her current clinician for six months and would like to be referred for a new clinician. CSW relayed this information to the pt.
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
## 2019-09-10 NOTE — Progress Notes (Signed)
   Date: 09/10/2019  Time: 9:30 am   Location: Craft room   Behavioral response: N/A   Intervention Topic: Happiness   Discussion/Intervention: Patient did not attend group.   Clinical Observations/Feedback:  Patient did not attend group.   Brnadon Eoff LRT/CTRS

## 2019-09-10 NOTE — Plan of Care (Signed)
Patient is newly admitted to the unit.  Problem: Education: Goal: Knowledge of [place] [facility] Education information/materials will improve Outcome: Not Progressing Goal: Emotional status will improve Outcome: Not Progressing Goal: Mental status will improve Outcome: Not Progressing Goal: Verbalization of understanding the information provided will improve Outcome: Not Progressing   Problem: Activity: Goal: Interest or engagement in activities will improve Outcome: Not Progressing Goal: Sleeping patterns will improve Outcome: Not Progressing   Problem: Coping: Goal: Ability to verbalize frustrations and anger appropriately will improve Outcome: Not Progressing Goal: Ability to demonstrate self-control will improve Outcome: Not Progressing   Problem: Health Behavior/Discharge Planning: Goal: Identification of resources available to assist in meeting health care needs will improve Outcome: Not Progressing Goal: Compliance with treatment plan for underlying cause of condition will improve Outcome: Not Progressing   Problem: Physical Regulation: Goal: Ability to maintain clinical measurements within normal limits will improve Outcome: Not Progressing   Problem: Safety: Goal: Periods of time without injury will increase Outcome: Not Progressing

## 2019-09-10 NOTE — BHH Suicide Risk Assessment (Signed)
[place] INPATIENT:  Family/Significant Other Suicide Prevention Education  Suicide Prevention Education:  Education Completed; Hisae Decoursey, husband [phone] has been identified by the patient as the family member/significant other with whom the patient will be residing, and identified as the person(s) who will aid the patient in the event of a mental health crisis (suicidal ideations/suicide attempt).  With written consent from the patient, the family member/significant other has been provided the following suicide prevention education, prior to the and/or following the discharge of the patient.  The suicide prevention education provided includes the following:  Suicide risk factors  Suicide prevention and interventions  National Suicide Hotline telephone number  [facility] assessment telephone number  [facility] Emergency Assistance [place] and/or Residential Mobile Crisis Unit telephone number  Request made of family/significant other to:  Remove weapons (e.g., guns, rifles, knives), all items previously/currently identified as safety concern.    Remove drugs/medications (over-the-counter, prescriptions, illicit drugs), all items previously/currently identified as a safety concern.  The family member/significant other verbalizes understanding of the suicide prevention education information provided.  The family member/significant other agrees to remove the items of safety concern listed above. Mr. Dillow reports the pt has a history of bipolar disorder and PTSD.  He states, in January the pt contracted Covid and became very weak. He says she was taken to the hospital and her psychiatric medication was stopped. He reports for a period of time the pt was doing well but in June "began cycling, experiencing mania and depression." This past week he says the pt made a comment threatening to overdose on prescription medication so he took and hid the medication. He  denies the pt having access to guns or weapons in the home and does not report any reservation about the pt returning home.  Elie Leppo T Quanita Barona 09/10/2019, 1:47 PM

## 2019-09-10 NOTE — BHH Group Notes (Signed)
LCSW Group Therapy Note  09/10/2019 2:25 PM  Type of Therapy and Topic:  Group Therapy:  Feelings around Relapse and Recovery  Participation Level:  None   Description of Group:    Patients in this group will discuss emotions they experience before and after a relapse. They will process how experiencing these feelings, or avoidance of experiencing them, relates to having a relapse. Facilitator will guide patients to explore emotions they have related to recovery. Patients will be encouraged to process which emotions are more powerful. They will be guided to discuss the emotional reaction significant others in their lives may have to their relapse or recovery. Patients will be assisted in exploring ways to respond to the emotions of others without this contributing to a relapse.  Therapeutic Goals: 1. Patient will identify two or more emotions that lead to a relapse for them 2. Patient will identify two emotions that result when they relapse 3. Patient will identify two emotions related to recovery 4. Patient will demonstrate ability to communicate their needs through discussion and/or role plays   Summary of Patient Progress: Patient attended group at the end, however, the group was about to end.   Therapeutic Modalities:   Cognitive Behavioral Therapy Solution-Focused Therapy Assertiveness Training Relapse Prevention Therapy   Assunta Curtis, MSW, LCSW 09/10/2019 2:25 PM

## 2019-09-10 NOTE — Progress Notes (Signed)
Admission note: 69yr old female admitted to the unit after being treated for SI thoughts.  Patient is calm and cooperative. She answers all questions asked and completed admission paper work. She continues to endorse SI, but says she has no plan at this time. She says that the thoughts come and go and she can not control them. She reports that is why she is here. "To get my thoughts and mood under control" She currently denies HI/AVH depression, but reports having high anxiety. She reports hoping to get her medication regimen adjusted while inpatient and stated she is looking forward to talking with the doctor in the morning. Her skin assessment revealed bilateral mastectomy.  Besides that there was no abnormal marks or bruising on the body. No contraband found on person or in belongings. Patient acclimated to the unit and remains safe at this time with 15 minute safety checks. Patient informed to contact staff with any concerns.

## 2019-09-10 NOTE — Plan of Care (Signed)
D- Patient alert and oriented. Patient presents in a pleasant mood on assessment stating that she slept "very well" last night and had no complaints to voice to this Probation officer. Patient denies any depression, however, she endorsed anxiety, reporting that she "generally has anxiety disorder", in which she did request PRN medication from this Probation officer. Patient also denies SI, HI, AVH, and pain at this time. Patient had no stated goals for today.  A- Scheduled medications administered to patient, per MD orders. Support and encouragement provided.  Routine safety checks conducted every 15 minutes.  Patient informed to notify staff with problems or concerns.  R- No adverse drug reactions noted. Patient contracts for safety at this time. Patient compliant with medications and treatment plan. Patient receptive, calm, and cooperative. Patient interacts well with others on the unit.  Patient remains safe at this time.  Problem: Education: Goal: Knowledge of [facility] Education information/materials will improve Outcome: Progressing Goal: Emotional status will improve Outcome: Progressing Goal: Mental status will improve Outcome: Progressing Goal: Verbalization of understanding the information provided will improve Outcome: Progressing   Problem: Activity: Goal: Interest or engagement in activities will improve Outcome: Progressing Goal: Sleeping patterns will improve Outcome: Progressing   Problem: Coping: Goal: Ability to verbalize frustrations and anger appropriately will improve Outcome: Progressing Goal: Ability to demonstrate self-control will improve Outcome: Progressing   Problem: Health Behavior/Discharge Planning: Goal: Identification of resources available to assist in meeting health care needs will improve Outcome: Progressing Goal: Compliance with treatment plan for underlying cause of condition will improve Outcome: Progressing   Problem: Physical Regulation: Goal: Ability  to maintain clinical measurements within normal limits will improve Outcome: Progressing   Problem: Safety: Goal: Periods of time without injury will increase Outcome: Progressing

## 2019-09-11 LAB — HEMOGLOBIN A1C
Hgb A1c MFr Bld: 5.3 % (ref 4.8–5.6)
Mean Plasma Glucose: 105 mg/dL

## 2019-09-11 NOTE — BHH Group Notes (Signed)
LCSW Group Therapy Note  09/11/2019   1:15 PM-2:15 PM   Type of Therapy and Topic:  Group Therapy: Anger Cues and Responses  Participation Level:  Active   Description of Group:   In this group, patients learned how to recognize the physical, cognitive, emotional, and behavioral responses they have to anger-provoking situations.  They identified a recent time they became angry and how they reacted.  They analyzed how their reaction was possibly beneficial and how it was possibly unhelpful.  The group discussed a variety of healthier coping skills that could help with such a situation in the future.  Focus was placed on how helpful it is to recognize the underlying emotions to our anger, because working on those can lead to a more permanent solution as well as our ability to focus on the important rather than the urgent.  Therapeutic Goals: 1. Patients will remember their last incident of anger and how they felt emotionally and physically, what their thoughts were at the time, and how they behaved. 2. Patients will identify how their behavior at that time worked for them, as well as how it worked against them. 3. Patients will explore possible new behaviors to use in future anger situations. 4. Patients will learn that anger itself is normal and cannot be eliminated, and that healthier reactions can assist with resolving conflict rather than worsening situations.  Summary of Patient Progress:  The patient shared that she recognizes she has anger issues but stated that the others in her family also have problems with their anger. She says she wishes they would deal with their issues at the same time she does hers.  Therapeutic Modalities:   Cognitive Behavioral Therapy  Rolanda Jay

## 2019-09-11 NOTE — Progress Notes (Signed)
Patient pleasant during assessment denying SI/HI/AVH, pain and depression. Patient endorses anxiety. Patient complaint with medication administration per MD orders. Patient observed by this writer interacting appropriately on the unit with staff and peers. Patient given education, support and encouragement to be active in her treatment plan. Patient being monitored Q 15 minutes for safety per unit protocol. Patient remains safe on the unit

## 2019-09-11 NOTE — Plan of Care (Signed)
Patient stated she was feeling better and feels she is ready to discharge soon  Problem: Education: Goal: Emotional status will improve Outcome: Progressing Goal: Mental status will improve Outcome: Progressing

## 2019-09-11 NOTE — Plan of Care (Signed)
D: Patient denies SI / HI / AVH. Patient reports increasing anxiety and depression. Patient is focused on complaint of frequent persistent diarrhea. Patient is a high fall risk and utilizes a four wheel walker for mobility and locomotion.   A:  Q x 15 minute observation checks were completed for safety. Patient was provided with education on medications. Patient was offered support and encouragement. Patient was given scheduled medications. Patient  was encourage to attend groups, participate in unit activities and continue with plan of care.     R: Patient is adherent with scheduled medications. Patient is provided with PRN medication. Patient has no complaints at this time. Patient is receptive to treatment and safety maintained on unit.    Problem: Education: Goal: Knowledge of [place] [facility] Education information/materials will improve Outcome: Progressing Goal: Verbalization of understanding the information provided will improve Outcome: Progressing

## 2019-09-11 NOTE — Plan of Care (Signed)
Patient stated she was feeling better today  Problem: Education: Goal: Emotional status will improve Outcome: Progressing Goal: Mental status will improve Outcome: Progressing

## 2019-09-11 NOTE — Progress Notes (Signed)
[facility] MD Progress Note  09/11/2019 1:29 PM Tiffany [name]  MRN:  [id number] Subjective:   I am doing okay thank you  Principal Problem: Bipolar affective disorder, mixed ([place]) Diagnosis: Principal Problem:   Bipolar affective disorder, mixed ([place]) Active Problems:   Psoriatic arthritis ([place])   Essential hypertension   Severe recurrent major depression without psychotic features ([place])  Total Time spent with patient:   19  '  Past Psychiatric History: previously noted   Past Medical History:  Past Medical History:  Diagnosis Date  . Bipolar 1 disorder ([place])   . Breast cancer ([place])   . Chronic diarrhea    loose stools twice a day on average for years  . Chronic kidney disease (CKD), stage III (moderate)   . Depression 1987  . Malignant neoplasm of overlapping sites of left breast in female, estrogen receptor positive ([place]) 03/14/2016   Dx in 09/2014, s/p bilateral mastectomies and ALND, 0/10 LN. 1.4 cm  Grade I invasive lobular, ER and PR +/Her--, Ki 67 <5% Tried anastrozole for one month, but developed suicidal idea  . Parkinson's disease ([place]) 2012  . Psoriatic arthritis ([place])   . PTSD (post-traumatic stress disorder)   . Secondary hyperparathyroidism ([place])   . Tardive dyskinesia     Past Surgical History:  Procedure Laterality Date  . ABDOMINAL HYSTERECTOMY  1987  . CHOLECYSTECTOMY  1979  . DILATION AND CURETTAGE OF UTERUS  1973  . MASTECTOMY Bilateral 09/19/2014  . TONSILLECTOMY  1970  . URETERAL REIMPLANTION Bilateral 1974   Family History:  Family History  Problem Relation Age of Onset  . Cancer Mother   . Cancer Sister   . Stroke Maternal Grandfather   . Diabetes Paternal Grandfather   . Cancer Sister    Family Psychiatric  History: previously noted  Social History:  Social History   Substance and Sexual Activity  Alcohol Use Never     Social History   Substance and Sexual Activity  Drug Use Never    Social History   Socioeconomic History  . Marital  status: Married    Spouse name: Not on file  . Number of children: Not on file  . Years of education: Not on file  . Highest education level: Not on file  Occupational History  . Not on file  Tobacco Use  . Smoking status: Never Smoker  . Smokeless tobacco: Never Used  Vaping Use  . Vaping Use: Never used  Substance and Sexual Activity  . Alcohol use: Never  . Drug use: Never  . Sexual activity: Not Currently  Other Topics Concern  . Not on file  Social History Narrative   Moved to area from [place] 08/2018   Lives one story home   Right handed.   Social Determinants of Health   Financial Resource Strain:   . Difficulty of Paying Living Expenses:   Food Insecurity:   . Worried About Charity fundraiser in the Last Year:   . Arboriculturist in the Last Year:   Transportation Needs:   . Film/video editor (Medical):   Marland Kitchen Lack of Transportation (Non-Medical):   Physical Activity:   . Days of Exercise per Week:   . Minutes of Exercise per Session:   Stress:   . Feeling of Stress :   Social Connections:   . Frequency of Communication with Friends and Family:   . Frequency of Social Gatherings with Friends and Family:   . Attends Religious Services:   .  Active Member of Clubs or Organizations:   . Attends Archivist Meetings:   Marland Kitchen Marital Status:    Additional Social History:         Patient is vague and avoidant says she is doing well.  Seems to have some oral tongue movements --may need trial of ingrezza    She is strange and odd, and seems dysphoric and distraught   Awaits effects of medications --may need further antipsychotics or mood stabilizers soon   Uses walker  Slow to ambulate                 Sleep:  She says it is improving   Appetite:   Fair she says   Current Medications: Current Facility-Administered Medications  Medication Dose Route Frequency Provider Last Rate Last Admin  . acetaminophen (TYLENOL) tablet 650 mg  650 mg  Oral Q6H PRN [name], [name], MD      . alum & mag hydroxide-simeth (MAALOX/MYLANTA) 200-200-20 MG/5ML suspension 30 mL  30 mL Oral Q4H PRN [name], [name], MD      . amLODipine (NORVASC) tablet 2.5 mg  2.5 mg Oral Daily [name], [name], MD   2.5 mg at 09/11/19 0726  . calcitRIOL (ROCALTROL) capsule 0.25 mcg  0.25 mcg Oral Q M,W,F [name], [name], MD   0.25 mcg at 09/10/19 0630  . hydrOXYzine (ATARAX/VISTARIL) tablet 10 mg  10 mg Oral TID PRN [name], [name], MD   10 mg at 09/10/19 0937  . lamoTRIgine (LAMICTAL) tablet 200 mg  200 mg Oral QHS [name], [name], MD   200 mg at 09/10/19 2131  . levETIRAcetam (KEPPRA) tablet 250 mg  250 mg Oral BID [name], [name], MD   250 mg at 09/11/19 0726  . levothyroxine (SYNTHROID) tablet 112 mcg  112 mcg Oral Q0600 [name], [name], MD   112 mcg at 09/11/19 [phone]  . loperamide (IMODIUM) capsule 2 mg  2 mg Oral PRN [name], [name], MD   2 mg at 09/11/19 0854  . LORazepam (ATIVAN) tablet 0.5 mg  0.5 mg Oral Q6H PRN [name], [name], MD   0.5 mg at 09/11/19 0848  . LORazepam (ATIVAN) tablet 1 mg  1 mg Oral QHS PRN [name], [name], MD   1 mg at 09/10/19 2131  . magnesium hydroxide (MILK OF MAGNESIA) suspension 30 mL  30 mL Oral Daily PRN [name], [name], MD      . sodium bicarbonate tablet 650 mg  650 mg Oral BID [name], [name], MD   650 mg at 09/11/19 0932    Lab Results:  Results for orders placed or performed during the hospital encounter of 09/09/19 (from the past 48 hour(s))  Hemoglobin A1c     Status: None   Collection Time: 09/10/19  8:51 AM  Result Value Ref Range   Hgb A1c MFr Bld 5.3 4.8 - 5.6 %    Comment: (NOTE)         Prediabetes: 5.7 - 6.4         Diabetes: >6.4         Glycemic control for adults with diabetes: <7.0    Mean Plasma Glucose 105 mg/dL    Comment: (NOTE) Performed At: [facility] [place], [name] [id number] Rush Farmer MD RK:[id number]   Lipid panel     Status: Abnormal   Collection Time: 09/10/19   8:51 AM  Result Value Ref Range   Cholesterol 193 0 -  200 mg/dL   Triglycerides 96 <150 mg/dL   HDL 56 >40 mg/dL   Total CHOL/HDL Ratio 3.4 RATIO   VLDL 19 0 - 40 mg/dL   LDL Cholesterol 118 (H) 0 - 99 mg/dL    Comment:        Total Cholesterol/HDL:CHD Risk Coronary Heart Disease Risk Table                     Men   Women  1/2 Average Risk   3.4   3.3  Average Risk       5.0   4.4  2 X Average Risk   9.6   7.1  3 X Average Risk  23.4   11.0        Use the calculated Patient Ratio above and the CHD Risk Table to determine the patient's CHD Risk.        ATP III CLASSIFICATION (LDL):  <100     mg/dL   Optimal  100-129  mg/dL   Near or Above                    Optimal  130-159  mg/dL   Borderline  160-189  mg/dL   High  >190     mg/dL   Very High Performed at [facility], [address] [place], [place] [address]   Comprehensive metabolic panel     Status: Abnormal   Collection Time: 09/10/19  8:51 AM  Result Value Ref Range   Sodium 142 135 - 145 mmol/L   Potassium 4.6 3.5 - 5.1 mmol/L   Chloride 106 98 - 111 mmol/L   CO2 24 22 - 32 mmol/L   Glucose, Bld 133 (H) 70 - 99 mg/dL    Comment: Glucose reference range applies only to samples taken after fasting for at least 8 hours.   BUN 31 (H) 8 - 23 mg/dL   Creatinine, Ser 2.08 (H) 0.44 - 1.00 mg/dL   Calcium 9.4 8.9 - 10.3 mg/dL   Total Protein 7.9 6.5 - 8.1 g/dL   Albumin 4.0 3.5 - 5.0 g/dL   AST 19 15 - 41 U/L   ALT 15 0 - 44 U/L   Alkaline Phosphatase 125 38 - 126 U/L   Total Bilirubin 1.3 (H) 0.3 - 1.2 mg/dL   GFR calc non Af Amer 24 (L) >60 mL/min   GFR calc Af Amer 27 (L) >60 mL/min   Anion gap 12 5 - 15    Comment: Performed at [facility], [address] [place], [place] [address]    Blood Alcohol level:  Lab Results  Component Value Date   [facility] <10 09/08/2019   ETH <10 63/84/5364    Metabolic Disorder Labs: Lab Results  Component Value Date   HGBA1C 5.3 09/10/2019   MPG 105  09/10/2019   No results found for: PROLACTIN Lab Results  Component Value Date   CHOL 193 09/10/2019   TRIG 96 09/10/2019   HDL 56 09/10/2019   CHOLHDL 3.4 09/10/2019   VLDL 19 09/10/2019   LDLCALC 118 (H) 09/10/2019    Physical Findings: AIMS:  , ,  ,  ,    CIWA:    COWS:     Musculoskeletal: Strength & Muscle Tone:  Needs walker  Gait & Station:  Uses walker  Patient leans:  N/a   Psychiatric Specialty Exam: Physical Exam  Review of Systems  Blood pressure 135/81, pulse 84, temperature 98.2  F (36.8 C), temperature source Oral, resp. rate 17, height 5\' 4"  (1.626 m), weight 93.9 kg, SpO2 99 %.Body mass index is 35.53 kg/m.  Mental Status   Appearance --haggard forlorn ---strange [place]ort --vague and limited Concentration and attention --fair Consciousness not clouded or fluctuant Speech normal  Thought process and content --somewhat vague, minimizing frank psychosis not observed Mood depressed dysphoric anxious Affect somewhat flat  Movements ---early buccal tongue movements  Judgement insight reliability fair SI and HI --contracts for safety  Memory ---not cooperative enough  Eye contact --comes and goes Sleep --improving she says  Assets --not known   Abstraction --somewhat concrete with proverbs and topics ADL s --limited when she has depression                                                           Treatment Plan Summary:  continues with current structure and plan   Pending stabilization   Eulas Post, MD 09/11/2019, 1:29 PM

## 2019-09-11 NOTE — Progress Notes (Signed)
Patient pleasant during assessment denying SI/HI/AVH, pain and depression. Patient endorses anxiety. Patient complaint with medication administration per MD orders. Patient observed by this writer interacting appropriately on the unit with staff and peers. Patient given education, support and encouragement to be active in her treatment plan. Patient being monitored Q 15 minutes for safety per unit protocol. Patient remains safe on the unit. Patient stated she had a good day and is hopeful to discharge on Monday.

## 2019-09-12 NOTE — Plan of Care (Signed)
D- Patient alert and oriented. Patient presents in a pleasant mood on assessment stating that she slept "really well" last night and had no complaints to voice to this Probation officer. Patient endorsed both depression and anxiety, stating "there's always underlying depression". In regards to the anxiety, patient stated "not as much that I need to take something for it, I'm usually good in the morning". Patient denies SI, HI, AVH, and pain at this time. Patient's goal for today is to "manage anger", in which she will "go to group-take steps learned at group yesterday", in order to achieve her goal.  A- Scheduled medications administered to patient, per MD orders. Support and encouragement provided.  Routine safety checks conducted every 15 minutes.  Patient informed to notify staff with problems or concerns.  R- No adverse drug reactions noted. Patient contracts for safety at this time. Patient compliant with medications and treatment plan. Patient receptive, calm, and cooperative. Patient interacts well with others on the unit.  Patient remains safe at this time.  Problem: Education: Goal: Knowledge of [place] [facility] Education information/materials will improve Outcome: Progressing Goal: Emotional status will improve Outcome: Progressing Goal: Mental status will improve Outcome: Progressing Goal: Verbalization of understanding the information provided will improve Outcome: Progressing   Problem: Activity: Goal: Interest or engagement in activities will improve Outcome: Progressing Goal: Sleeping patterns will improve Outcome: Progressing   Problem: Coping: Goal: Ability to verbalize frustrations and anger appropriately will improve Outcome: Progressing Goal: Ability to demonstrate self-control will improve Outcome: Progressing   Problem: Health Behavior/Discharge Planning: Goal: Identification of resources available to assist in meeting health care needs will improve Outcome:  Progressing Goal: Compliance with treatment plan for underlying cause of condition will improve Outcome: Progressing   Problem: Physical Regulation: Goal: Ability to maintain clinical measurements within normal limits will improve Outcome: Progressing   Problem: Safety: Goal: Periods of time without injury will increase Outcome: Progressing

## 2019-09-12 NOTE — [facility] Group Notes (Signed)
[facility] LCSW Group Therapy Note  Date/Time:  09/12/2019 1:10 PM- 1:57 PM  Type of Therapy and Topic:  Group Therapy:  Healthy and Unhealthy Supports  Participation Level:  Active   Description of Group:  Patients in this group were introduced to the idea of adding a variety of healthy supports to address the various needs in their lives.Patients discussed what additional healthy supports could be helpful in their recovery and wellness after discharge in order to prevent future hospitalizations.   An emphasis was placed on using counselor, doctor, therapy groups, 12-step groups, and problem-specific support groups to expand supports.  They also worked as a group on developing a specific plan for several patients to deal with unhealthy supports through [place], psychoeducation with loved ones, and even termination of relationships.   Therapeutic Goals:   1)  discuss importance of adding supports to stay well once out of the hospital  2)  compare healthy versus unhealthy supports and identify some examples of each  3)  generate ideas and descriptions of healthy supports that can be added  4)  offer mutual support about how to address unhealthy supports  5)  encourage active participation in and adherence to discharge plan    Summary of Patient Progress:  The patient stated that current healthy supports in her life is her therapist. Patients stated their name and checked in from 1-10 of what their temperature is today. Patient stated that she is a 7 out of 10. Patient stated that she feels she is a 7 because she has been walking around and talking with her peers. Patient stated she feels supportive when someone is nonjudgmental, understanding, and compassionate. Patient did not state her current unhealthy supports. The patient expressed a willingness to add a family therapist as a support to help in her recovery journey.   Therapeutic Modalities:   Motivational  Interviewing Brief Solution-Focused Therapy  Raina Mina, Latanya Presser 09/12/2019  2:32 PM

## 2019-09-12 NOTE — Progress Notes (Signed)
[facility] MD Progress Note  09/12/2019 11:08 AM Tiffany [name]  MRN:  [id number] Subjective:   Can I go home tomorrow  Principal Problem: Bipolar affective disorder, mixed ([place]) Diagnosis: Principal Problem:   Bipolar affective disorder, mixed ([place]) Active Problems:   Psoriatic arthritis ([place])   Essential hypertension   Severe recurrent major depression without psychotic features ([place])  Total Time spent with patient:   15 min  Past Psychiatric History:  Already noted   Past Medical History:  Past Medical History:  Diagnosis Date  . Bipolar 1 disorder ([place])   . Breast cancer ([place])   . Chronic diarrhea    loose stools twice a day on average for years  . Chronic kidney disease (CKD), stage III (moderate)   . Depression 1987  . Malignant neoplasm of overlapping sites of left breast in female, estrogen receptor positive ([place]) 03/14/2016   Dx in 09/2014, s/p bilateral mastectomies and ALND, 0/10 LN. 1.4 cm  Grade I invasive lobular, ER and PR +/Her--, Ki 67 <5% Tried anastrozole for one month, but developed suicidal idea  . Parkinson's disease ([place]) 2012  . Psoriatic arthritis ([place])   . PTSD (post-traumatic stress disorder)   . Secondary hyperparathyroidism ([place])   . Tardive dyskinesia     Past Surgical History:  Procedure Laterality Date  . ABDOMINAL HYSTERECTOMY  1987  . CHOLECYSTECTOMY  1979  . DILATION AND CURETTAGE OF UTERUS  1973  . MASTECTOMY Bilateral 09/19/2014  . TONSILLECTOMY  1970  . URETERAL REIMPLANTION Bilateral 1974   Family History:  Family History  Problem Relation Age of Onset  . Cancer Mother   . Cancer Sister   . Stroke Maternal Grandfather   . Diabetes Paternal Grandfather   . Cancer Sister    Family Psychiatric  History: already written Social History:  Social History   Substance and Sexual Activity  Alcohol Use Never     Social History   Substance and Sexual Activity  Drug Use Never    Social History   Socioeconomic History  . Marital  status: Married    Spouse name: Not on file  . Number of children: Not on file  . Years of education: Not on file  . Highest education level: Not on file  Occupational History  . Not on file  Tobacco Use  . Smoking status: Never Smoker  . Smokeless tobacco: Never Used  Vaping Use  . Vaping Use: Never used  Substance and Sexual Activity  . Alcohol use: Never  . Drug use: Never  . Sexual activity: Not Currently  Other Topics Concern  . Not on file  Social History Narrative   Moved to area from [place] 08/2018   Lives one story home   Right handed.   Social Determinants of Health   Financial Resource Strain:   . Difficulty of Paying Living Expenses:   Food Insecurity:   . Worried About Charity fundraiser in the Last Year:   . Arboriculturist in the Last Year:   Transportation Needs:   . Film/video editor (Medical):   Marland Kitchen Lack of Transportation (Non-Medical):   Physical Activity:   . Days of Exercise per Week:   . Minutes of Exercise per Session:   Stress:   . Feeling of Stress :   Social Connections:   . Frequency of Communication with Friends and Family:   . Frequency of Social Gatherings with Friends and Family:   . Attends Religious Services:   .  Active Member of Clubs or Organizations:   . Attends Archivist Meetings:   Marland Kitchen Marital Status:    Additional Social History:         She uses her wlaker, may have early buccal lingual tongue movements and may need trial of ingrezza vs. Denture and teeth issues   She is vague and superficial she wants to go home in am.  Unclear if meds are working or are enough  She does not want new meds she says  Tried to make supportive statements   Discharge planning this upcoming week                 Sleep:  Improving she says   Appetite:   Fair to better   Current Medications: Current Facility-Administered Medications  Medication Dose Route Frequency Provider Last Rate Last Admin  . acetaminophen  (TYLENOL) tablet 650 mg  650 mg Oral Q6H PRN [name], [name], MD      . alum & mag hydroxide-simeth (MAALOX/MYLANTA) 200-200-20 MG/5ML suspension 30 mL  30 mL Oral Q4H PRN [name], [name], MD      . amLODipine (NORVASC) tablet 2.5 mg  2.5 mg Oral Daily [name], [name], MD   2.5 mg at 09/12/19 0837  . calcitRIOL (ROCALTROL) capsule 0.25 mcg  0.25 mcg Oral Q M,W,F [name], [name], MD   0.25 mcg at 09/10/19 5053  . hydrOXYzine (ATARAX/VISTARIL) tablet 10 mg  10 mg Oral TID PRN [name], [name], MD   10 mg at 09/10/19 0937  . lamoTRIgine (LAMICTAL) tablet 200 mg  200 mg Oral QHS [name], [name], MD   200 mg at 09/11/19 2111  . levETIRAcetam (KEPPRA) tablet 250 mg  250 mg Oral BID [name], [name], MD   250 mg at 09/12/19 0837  . levothyroxine (SYNTHROID) tablet 112 mcg  112 mcg Oral Q0600 [name], [name], MD   112 mcg at 09/12/19 [phone]  . loperamide (IMODIUM) capsule 2 mg  2 mg Oral PRN [name], [name], MD   2 mg at 09/11/19 0854  . LORazepam (ATIVAN) tablet 0.5 mg  0.5 mg Oral Q6H PRN [name], [name], MD   0.5 mg at 09/11/19 0848  . LORazepam (ATIVAN) tablet 1 mg  1 mg Oral QHS PRN [name], [name], MD   1 mg at 09/11/19 2111  . magnesium hydroxide (MILK OF MAGNESIA) suspension 30 mL  30 mL Oral Daily PRN [name], [name], MD      . sodium bicarbonate tablet 650 mg  650 mg Oral BID [name], [name], MD   650 mg at 09/12/19 3419    Lab Results: No results found for this or any previous visit (from the past 48 hour(s)).  Blood Alcohol level:  Lab Results  Component Value Date   ETH <10 09/08/2019   ETH <10 37/90/2409    Metabolic Disorder Labs: Lab Results  Component Value Date   HGBA1C 5.3 09/10/2019   MPG 105 09/10/2019   No results found for: PROLACTIN Lab Results  Component Value Date   CHOL 193 09/10/2019   TRIG 96 09/10/2019   HDL 56 09/10/2019   CHOLHDL 3.4 09/10/2019   VLDL 19 09/10/2019   LDLCALC 118 (H) 09/10/2019    Physical Findings: AIMS:  , ,  ,  ,   not done  CIWA:     COWS:     Musculoskeletal: Strength & Muscle Tone:  Not known but uses walker  Gait &  Station:  Environmental consultant  Patient leans:  Na/a   Psychiatric Specialty Exam: Physical Exam  Review of Systems  Blood pressure 139/67, pulse 86, temperature 98.2 F (36.8 C), temperature source Oral, resp. rate 17, height 5\' 4"  (1.626 m), weight 93.9 kg, SpO2 100 %.Body mass index is 35.53 kg/m.      Brief mental Status pertinents  Alert cooperative oriented times four Poor judgement and insight  Superficial  She is not participating in deeper issues  Oral movements as noted above Mood and affect somewhat anxious and depressed No other new psychosis or mania Contracts for safety                                                    Treatment Plan Summary: Awaits outpatient plan in am and discharge planning post helping with outpatient follow up   Eulas Post, MD 09/12/2019, 11:08 AM

## 2019-09-13 LAB — BASIC METABOLIC PANEL
Anion gap: 10 (ref 5–15)
BUN: 34 mg/dL — ABNORMAL HIGH (ref 8–23)
CO2: 25 mmol/L (ref 22–32)
Calcium: 9.2 mg/dL (ref 8.9–10.3)
Chloride: 104 mmol/L (ref 98–111)
Creatinine, Ser: 2.2 mg/dL — ABNORMAL HIGH (ref 0.44–1.00)
GFR calc Af Amer: 25 mL/min — ABNORMAL LOW (ref >60)
GFR calc non Af Amer: 22 mL/min — ABNORMAL LOW (ref >60)
Glucose, Bld: 100 mg/dL — ABNORMAL HIGH (ref 70–99)
Potassium: 4.2 mmol/L (ref 3.5–5.1)
Sodium: 139 mmol/L (ref 135–145)

## 2019-09-13 MED ORDER — LITHIUM CARBONATE 150 MG PO CAPS
150.0000 mg | ORAL_CAPSULE | Freq: Every day | ORAL | Status: DC
  Administered 2019-09-13: 150 mg via ORAL
  Filled 2019-09-13: qty 1

## 2019-09-13 NOTE — Progress Notes (Signed)
Recreation Therapy Notes  INPATIENT RECREATION THERAPY ASSESSMENT  Patient Details Name: Careen Mauch MRN: [id number] DOB: [birth year] Today's Date: 09/13/2019       Information Obtained From: Patient  Able to Participate in Assessment/Interview: Yes  Patient Presentation: Responsive  Reason for Admission (Per Patient): Active Symptoms, Suicidal Ideation  Patient Stressors: Family  Coping Skills:   Exercise, Talk, Prayer, Other (Comment) (Medication)  Leisure Interests (2+):  Individual - Reading, Music - Listen  Frequency of Recreation/Participation:    Awareness of Community Resources:     [employer]:     Current Use:    If no, Barriers?:    Expressed Interest in [employer] Information:    [place] of Residence:  [place]  Patient Main Form of Transportation: Other (Comment) (Family)  Patient Strengths:  Good listener  Patient Identified Areas of Improvement:  Patience with family  Patient Goal for Hospitalization:  Medication adjustments  Current SI (including self-harm):  No  Current HI:  No  Current AVH: No  Staff Intervention Plan: Group Attendance, Collaborate with Interdisciplinary Treatment Team  Consent to Intern Participation: N/A  Aeric Burnham 09/13/2019, 7:55 AM

## 2019-09-13 NOTE — Progress Notes (Signed)
Patient has been pleasant and cooperative. Has occasional fleeting thoughts of SI but denies any currently. Denies AVH.

## 2019-09-13 NOTE — Progress Notes (Signed)
Recreation Therapy Notes   Date: 09/13/2019  Time: 9:30 am  Location: Craft room   Behavioral response: Appropriate  Intervention Topic: Necessities   Discussion/Intervention:  Group content on today was focused on necessities. The group defined necessities and how they determine their necessities. Individuals expressed how many necessities they have and if it changes from day to day. Patients described the difference between wants and needs. The group explained how they have overspent on wants in the past. Individuals described a reoccurring necessity for them. The intervention "What I need" helped patients differentiate between wants and needs.  Clinical Observations/Feedback:  Patient came to group and defined necessities as something you need to live. Participant identified her necessities as acceptance, protection and showering. Individual was actively social with peers and staff while participating in the intervention.  Kaspian Muccio LRT/CTRS          Makelle Marrone 09/13/2019 10:38 AM

## 2019-09-13 NOTE — Plan of Care (Signed)
  Problem: Education: Goal: Knowledge of [facility] Education information/materials will improve Outcome: Progressing Goal: Emotional status will improve Outcome: Progressing Goal: Mental status will improve Outcome: Progressing Goal: Verbalization of understanding the information provided will improve Outcome: Progressing   Problem: Activity: Goal: Interest or engagement in activities will improve Outcome: Progressing Goal: Sleeping patterns will improve Outcome: Progressing   

## 2019-09-13 NOTE — Plan of Care (Signed)
D- Patient alert and oriented. Patient presents in a pleasant mood on assessment stating that she slept "really well" last night and had no complaints to voice to this Probation officer. Patient rated both depression and anxiety a "4/10", stating that "it's fine until they say something to me that makes it go up", regarding her family members. Patient denies SI, HI, AVH, and pain at this time. Patient's goal for today is "possible discharge", in which she will "talk tot staff in order to achieve her goal".  A- Scheduled medications administered to patient, per MD orders. Support and encouragement provided.  Routine safety checks conducted every 15 minutes.  Patient informed to notify staff with problems or concerns.  R- No adverse drug reactions noted. Patient contracts for safety at this time. Patient compliant with medications and treatment plan. Patient receptive, calm, and cooperative. Patient interacts well with others on the unit.  Patient remains safe at this time.  Problem: Education: Goal: Knowledge of [place] [facility] Education information/materials will improve Outcome: Progressing Goal: Emotional status will improve Outcome: Progressing Goal: Mental status will improve Outcome: Progressing Goal: Verbalization of understanding the information provided will improve Outcome: Progressing   Problem: Activity: Goal: Interest or engagement in activities will improve Outcome: Progressing Goal: Sleeping patterns will improve Outcome: Progressing   Problem: Coping: Goal: Ability to verbalize frustrations and anger appropriately will improve Outcome: Progressing Goal: Ability to demonstrate self-control will improve Outcome: Progressing   Problem: Health Behavior/Discharge Planning: Goal: Identification of resources available to assist in meeting health care needs will improve Outcome: Progressing Goal: Compliance with treatment plan for underlying cause of condition will improve Outcome:  Progressing   Problem: Physical Regulation: Goal: Ability to maintain clinical measurements within normal limits will improve Outcome: Progressing   Problem: Safety: Goal: Periods of time without injury will increase Outcome: Progressing

## 2019-09-13 NOTE — [facility] Group Notes (Signed)
LCSW Group Therapy Note   09/13/2019 1:43 PM  Type of Therapy and Topic:  Group Therapy:  Overcoming Obstacles   Participation Level:  Did Not Attend   Description of Group:    In this group patients will be encouraged to explore what they see as obstacles to their own wellness and recovery. They will be guided to discuss their thoughts, feelings, and behaviors related to these obstacles. The group will process together ways to cope with barriers, with attention given to specific choices patients can make. Each patient will be challenged to identify changes they are motivated to make in order to overcome their obstacles. This group will be process-oriented, with patients participating in exploration of their own experiences as well as giving and receiving support and challenge from other group members.   Therapeutic Goals: 1. Patient will identify personal and current obstacles as they relate to admission. 2. Patient will identify barriers that currently interfere with their wellness or overcoming obstacles.  3. Patient will identify feelings, thought process and behaviors related to these barriers. 4. Patient will identify two changes they are willing to make to overcome these obstacles:      Summary of Patient Progress X   Therapeutic Modalities:   Cognitive Behavioral Therapy Solution Focused Therapy Motivational Interviewing Relapse Prevention Therapy  Assunta Curtis, MSW, LCSW 09/13/2019 1:43 PM

## 2019-09-13 NOTE — Progress Notes (Signed)
[facility] MD Progress Note  09/13/2019 11:06 AM Tiffany [name]  MRN:  [id number] Subjective: Patient seen chart reviewed.  Patient reports she is slightly better but still feels nervous jittery and not quite her normal self.  She spoke with her daughter who feels uncomfortable with the patient coming home in her current condition.  Patient has no specific new physical complaints.  Still feels a little unsteady on her feet.  Still has occasional suicidal thoughts without any specific plan. Principal Problem: Bipolar affective disorder, mixed ([place]) Diagnosis: Principal Problem:   Bipolar affective disorder, mixed ([place]) Active Problems:   Psoriatic arthritis ([place])   Essential hypertension   Severe recurrent major depression without psychotic features ([place])  Total Time spent with patient: 30 minutes  Past Psychiatric History: Past history of bipolar disorder with recent lack of stability  Past Medical History:  Past Medical History:  Diagnosis Date  . Bipolar 1 disorder ([place])   . Breast cancer ([place])   . Chronic diarrhea    loose stools twice a day on average for years  . Chronic kidney disease (CKD), stage III (moderate)   . Depression 1987  . Malignant neoplasm of overlapping sites of left breast in female, estrogen receptor positive ([place]) 03/14/2016   Dx in 09/2014, s/p bilateral mastectomies and ALND, 0/10 LN. 1.4 cm  Grade I invasive lobular, ER and PR +/Her--, Ki 67 <5% Tried anastrozole for one month, but developed suicidal idea  . Parkinson's disease ([place]) 2012  . Psoriatic arthritis ([place])   . PTSD (post-traumatic stress disorder)   . Secondary hyperparathyroidism ([place])   . Tardive dyskinesia     Past Surgical History:  Procedure Laterality Date  . ABDOMINAL HYSTERECTOMY  1987  . CHOLECYSTECTOMY  1979  . DILATION AND CURETTAGE OF UTERUS  1973  . MASTECTOMY Bilateral 09/19/2014  . TONSILLECTOMY  1970  . URETERAL REIMPLANTION Bilateral 1974   Family History:  Family History   Problem Relation Age of Onset  . Cancer Mother   . Cancer Sister   . Stroke Maternal Grandfather   . Diabetes Paternal Grandfather   . Cancer Sister    Family Psychiatric  History: See previous Social History:  Social History   Substance and Sexual Activity  Alcohol Use Never     Social History   Substance and Sexual Activity  Drug Use Never    Social History   Socioeconomic History  . Marital status: Married    Spouse name: Not on file  . Number of children: Not on file  . Years of education: Not on file  . Highest education level: Not on file  Occupational History  . Not on file  Tobacco Use  . Smoking status: Never Smoker  . Smokeless tobacco: Never Used  Vaping Use  . Vaping Use: Never used  Substance and Sexual Activity  . Alcohol use: Never  . Drug use: Never  . Sexual activity: Not Currently  Other Topics Concern  . Not on file  Social History Narrative   Moved to area from [place] 08/2018   Lives one story home   Right handed.   Social Determinants of Health   Financial Resource Strain:   . Difficulty of Paying Living Expenses:   Food Insecurity:   . Worried About Charity fundraiser in the Last Year:   . Arboriculturist in the Last Year:   Transportation Needs:   . Film/video editor (Medical):   Marland Kitchen Lack of Transportation (Non-Medical):  Physical Activity:   . Days of Exercise per Week:   . Minutes of Exercise per Session:   Stress:   . Feeling of Stress :   Social Connections:   . Frequency of Communication with Friends and Family:   . Frequency of Social Gatherings with Friends and Family:   . Attends Religious Services:   . Active Member of Clubs or Organizations:   . Attends Archivist Meetings:   Marland Kitchen Marital Status:    Additional Social History:                         Sleep: Fair  Appetite:  Fair  Current Medications: Current Facility-Administered Medications  Medication Dose Route Frequency Provider  Last Rate Last Admin  . acetaminophen (TYLENOL) tablet 650 mg  650 mg Oral Q6H PRN Maybelline Kolarik T, MD      . alum & mag hydroxide-simeth (MAALOX/MYLANTA) 200-200-20 MG/5ML suspension 30 mL  30 mL Oral Q4H PRN Glendola Friedhoff T, MD      . amLODipine (NORVASC) tablet 2.5 mg  2.5 mg Oral Daily Mabell Esguerra, [name], MD   2.5 mg at 09/13/19 7124  . calcitRIOL (ROCALTROL) capsule 0.25 mcg  0.25 mcg Oral Q M,W,F Linell Meldrum T, MD   0.25 mcg at 09/13/19 1056  . hydrOXYzine (ATARAX/VISTARIL) tablet 10 mg  10 mg Oral TID PRN Aracelys Glade, [name], MD   10 mg at 09/10/19 0937  . lamoTRIgine (LAMICTAL) tablet 200 mg  200 mg Oral QHS Ireta Pullman T, MD   200 mg at 09/12/19 2106  . levETIRAcetam (KEPPRA) tablet 250 mg  250 mg Oral BID Waylin Dorko, [name], MD   250 mg at 09/13/19 5809  . levothyroxine (SYNTHROID) tablet 112 mcg  112 mcg Oral Q0600 Ezreal Turay, [name], MD   112 mcg at 09/13/19 0630  . lithium carbonate capsule 150 mg  150 mg Oral QHS Jakeisha Stricker T, MD      . loperamide (IMODIUM) capsule 2 mg  2 mg Oral PRN Shadee Montoya, [name], MD   2 mg at 09/11/19 0854  . LORazepam (ATIVAN) tablet 0.5 mg  0.5 mg Oral Q6H PRN Dorethea Strubel T, MD   0.5 mg at 09/12/19 1607  . LORazepam (ATIVAN) tablet 1 mg  1 mg Oral QHS PRN Lidiya Reise, [name], MD   1 mg at 09/12/19 2108  . magnesium hydroxide (MILK OF MAGNESIA) suspension 30 mL  30 mL Oral Daily PRN Tyona Nilsen T, MD      . sodium bicarbonate tablet 650 mg  650 mg Oral BID Montrice Montuori, [name], MD   650 mg at 09/13/19 9833    Lab Results: No results found for this or any previous visit (from the past 48 hour(s)).  Blood Alcohol level:  Lab Results  Component Value Date   ETH <10 09/08/2019   ETH <10 82/50/5397    Metabolic Disorder Labs: Lab Results  Component Value Date   HGBA1C 5.3 09/10/2019   MPG 105 09/10/2019   No results found for: PROLACTIN Lab Results  Component Value Date   CHOL 193 09/10/2019   TRIG 96 09/10/2019   HDL 56 09/10/2019   CHOLHDL 3.4 09/10/2019    VLDL 19 09/10/2019   LDLCALC 118 (H) 09/10/2019    Physical Findings: AIMS:  , ,  ,  ,    CIWA:    COWS:     Musculoskeletal: Strength & Muscle Tone: within normal limits Gait &  Station: normal Patient leans: N/A  Psychiatric Specialty Exam: Physical Exam Vitals and nursing note reviewed.  Constitutional:      Appearance: She is well-developed.  HENT:     Head: Normocephalic and atraumatic.  Eyes:     Conjunctiva/sclera: Conjunctivae normal.     Pupils: Pupils are equal, round, and reactive to light.  Cardiovascular:     Heart sounds: Normal heart sounds.  Pulmonary:     Effort: Pulmonary effort is normal.  Abdominal:     Palpations: Abdomen is soft.  Musculoskeletal:        General: Normal range of motion.     Cervical back: Normal range of motion.  Skin:    General: Skin is warm and dry.  Neurological:     General: No focal deficit present.     Mental Status: She is alert.  Psychiatric:        Mood and Affect: Mood is anxious.        Speech: Speech normal.        Behavior: Behavior is cooperative.        Thought Content: Thought content normal.        Cognition and Memory: Cognition normal.        Judgment: Judgment normal.     Review of Systems  Constitutional: Negative.   HENT: Negative.   Eyes: Negative.   Respiratory: Negative.   Cardiovascular: Negative.   Gastrointestinal: Negative.   Musculoskeletal: Negative.   Skin: Negative.   Neurological: Negative.   Psychiatric/Behavioral: The patient is nervous/anxious.     Blood pressure 117/75, pulse 77, temperature 98.2 F (36.8 C), temperature source Oral, resp. rate 18, height 5\' 4"  (1.626 m), weight 93.9 kg, SpO2 100 %.Body mass index is 35.53 kg/m.  General Appearance: Casual  Eye Contact:  Good  Speech:  Clear and Coherent  Volume:  Normal  Mood:  Anxious  Affect:  Congruent  Thought Process:  Coherent  Orientation:  Full (Time, Place, and Person)  Thought Content:  Logical  Suicidal  Thoughts:  Yes.  without intent/plan  Homicidal Thoughts:  No  Memory:  Immediate;   Fair Recent;   Fair Remote;   Fair  Judgement:  Fair  Insight:  Fair  Psychomotor Activity:  Normal  Concentration:  Concentration: Fair  Recall:  [employer] of Knowledge:  Fair  Language:  Fair  Akathisia:  No  Handed:  Right  AIMS (if indicated):     Assets:  Desire for Improvement Housing Resilience Social Support  ADL's:  Intact  Cognition:  WNL  Sleep:  Number of Hours: 8     Treatment Plan Summary: Daily contact with patient to assess and evaluate symptoms and progress in treatment, Medication management and Plan Supportive counseling and review of symptoms with patient.  She does not feel safe enough or confident enough to go home at this point.  I will suggest that we follow through on what we had discussed previously of adding a very low-dose of lithium just 150 mg at night.  Patient agrees to this.  We will recheck a chemistry panel and EKG today.  Side effects reviewed.  Alethia Berthold, MD 09/13/2019, 11:06 AM

## 2019-09-14 ENCOUNTER — Telehealth: Payer: Self-pay | Admitting: Adult Health

## 2019-09-14 MED ORDER — SODIUM BICARBONATE 650 MG PO TABS: 650.0000 mg | ORAL_TABLET | Freq: Two times a day (BID) | ORAL | 0 refills | Status: DC

## 2019-09-14 MED ORDER — LORAZEPAM 1 MG PO TABS: 1.0000 mg | ORAL_TABLET | Freq: Every evening | ORAL | 1 refills | Status: DC | PRN

## 2019-09-14 MED ORDER — LAMOTRIGINE 200 MG PO TABS: 200.0000 mg | ORAL_TABLET | Freq: Every day | ORAL | 1 refills | Status: DC

## 2019-09-14 MED ORDER — CALCITRIOL 0.25 MCG PO CAPS: 0.2500 ug | ORAL_CAPSULE | ORAL | 0 refills | Status: DC

## 2019-09-14 MED ORDER — HYDROXYZINE HCL 10 MG PO TABS: 10.0000 mg | ORAL_TABLET | Freq: Three times a day (TID) | ORAL | 1 refills | Status: DC | PRN

## 2019-09-14 MED ORDER — LITHIUM CARBONATE 150 MG PO CAPS: 150.0000 mg | ORAL_CAPSULE | Freq: Every day | ORAL | 1 refills | Status: DC

## 2019-09-14 MED ORDER — AMLODIPINE BESYLATE 2.5 MG PO TABS: 2.5000 mg | ORAL_TABLET | Freq: Every day | ORAL | 1 refills | Status: DC

## 2019-09-14 MED ORDER — LEVETIRACETAM 250 MG PO TABS: 250.0000 mg | ORAL_TABLET | Freq: Two times a day (BID) | ORAL | 1 refills | Status: DC

## 2019-09-14 MED ORDER — LEVOTHYROXINE SODIUM 112 MCG PO TABS: 112.0000 ug | ORAL_TABLET | Freq: Every day | ORAL | 1 refills | Status: DC

## 2019-09-14 NOTE — Telephone Encounter (Signed)
[name] called to report that [place] will be released from the hospital today.  They changed some of her medications.  She has appt. 7/14 but since there are medication changes should she come in sooner?

## 2019-09-14 NOTE — Progress Notes (Signed)
D: Pt alert and oriented x 4. Pt denies experiencing any pain, SI/HI, or AVH at this time. Pt reports she will be able to keep herself safe when she returns home. Pt was given a survey to fill out.  A: Pt received discharge and medication education/information. Pt belongings were returned upon discharge. Pt confirmed all belongings were present to include one wedding ring.   R: Pt verbalized understanding of discharge and medication education/information.  Pt escorted to medical mall front lobby where husband picked pt up.

## 2019-09-14 NOTE — Progress Notes (Signed)
Recreation Therapy Notes  INPATIENT RECREATION TR PLAN  Patient Details Name: [name] MRN: [id number] DOB: Mar 18, 1950 Today's Date: 09/14/2019  Rec Therapy Plan Is patient appropriate for Therapeutic Recreation?: Yes Treatment times per week: at least 3 Estimated Length of Stay: 5-7days TR Treatment/Interventions: Group participation (Comment)  Discharge Criteria Pt will be discharged from therapy if:: Discharged Treatment plan/goals/alternatives discussed and agreed upon by:: Patient/family  Discharge Summary Short term goals set: Patient will engage in groups without prompting or encouragement from LRT x3 group sessions within 5 recreation therapy group sessions Short term goals met: Complete Progress toward goals comments: Groups attended Which groups?: Social skills, Other (Comment) Quarry manager) Reason goals not met: N/A Therapeutic equipment acquired: N/A Reason patient discharged from therapy: Discharge from hospital Pt/family agrees with progress & goals achieved: Yes Date patient discharged from therapy: 09/14/19   Tiffany [name] 09/14/2019, 11:12 AM

## 2019-09-14 NOTE — Plan of Care (Signed)
Pt presents pleasant, calm and cooperative with POC. She denied SI/HI/AVH or self harm intentions. When asked about her mood and depression she stated "I don't know if what I feel is depression or mania because It feels like a volcano that has come alive. I feel things that I haven't felt in a long time and my family is not used to me being this person, they are used to a zombie." Pt reports feelings of anger towards her daughter but at the same time understands why her daughter doesn't want her home because of the person she is now. She however reports feeling ready to be discharged home and that her husband is very supportive of it. Pt verbalized the need for "family therapy" to help with "my family situation." She reported good appetite, hydration and elimination. She is well groomed, was med compliant and is ambulating with a walker with no falls noted thus far. PRN Ativan was administered for sleep with good effect with no unsafe behavior noted. Q15 minutes observations maintained for safety and support provided as needed.  Problem: Education: Goal: Knowledge of [facility] Education information/materials will improve Outcome: Progressing Goal: Emotional status will improve Outcome: Progressing Goal: Mental status will improve Outcome: Progressing Goal: Verbalization of understanding the information provided will improve Outcome: Progressing  Goal: Sleeping patterns will improve Outcome: Progressing   Problem: Coping: Goal: Ability to verbalize frustrations and anger appropriately will improve Outcome: Progressing Goal: Ability to demonstrate self-control will improve Outcome: Progressing   Problem: Health Behavior/Discharge Planning: Goal: Identification of resources available to assist in meeting health care needs will improve Outcome: Progressing Goal: Compliance with treatment plan for underlying cause of condition will improve Outcome: Progressing   Problem: Physical  Regulation: Goal: Ability to maintain clinical measurements within normal limits will improve Outcome: Progressing   Problem: Safety: Goal: Periods of time without injury will increase Outcome: Progressing

## 2019-09-14 NOTE — Plan of Care (Signed)
D: Pt alert and oriented x 4. Pt rates depression 3/10, hopelessness 3/10, and anxiety 3/10. Pt goal: "discharge." Pt reports energy level as normal and concentration as being good. Pt reports sleep last night as being good. Pt did receive medications for sleep and did find them helpful. Pt reports experiencing 5/10 pain in her left leg, prn meds given. Pt denies experiencing any SI/HI, or AVH at this time.   A: Scheduled medications administered to pt, per MD orders. Support and encouragement provided. Frequent verbal contact made. Routine safety checks conducted q15 minutes.   R: No adverse drug reactions noted. Pt verbally contracts for safety at this time. Pt complaint with medications and treatment plan. Pt interacts well with others on the unit. Pt remains safe at this time. Will continue to monitor.   Problem: Education: Goal: Emotional status will improve Outcome: Progressing Goal: Mental status will improve Outcome: Progressing   Problem: Activity: Goal: Sleeping patterns will improve Outcome: Progressing   Problem: Coping: Goal: Ability to verbalize frustrations and anger appropriately will improve Outcome: Progressing

## 2019-09-14 NOTE — Discharge Summary (Signed)
Physician Discharge Summary Note  Patient:  Tiffany [name] is an 69 y.o., female MRN:  [id number] DOB:  [birth year] Patient phone:  [phone] (home)  Patient address:   [address] [place] [address],  Total Time spent with patient: 30 minutes  Date of Admission:  09/09/2019 Date of Discharge: 09/14/2019  Reason for Admission: Admitted to the hospital because of worsening mood instability passive suicidal ideation anxiety.  History of bipolar disorder  Principal Problem: Bipolar affective disorder, mixed ([place]) Discharge Diagnoses: Principal Problem:   Bipolar affective disorder, mixed ([place]) Active Problems:   Psoriatic arthritis ([place])   Essential hypertension   Severe recurrent major depression without psychotic features ([place])   Past Psychiatric History: History of bipolar disorder with previous stability until recent medication changes  Past Medical History:  Past Medical History:  Diagnosis Date  . Bipolar 1 disorder ([place])   . Breast cancer ([place])   . Chronic diarrhea    loose stools twice a day on average for years  . Chronic kidney disease (CKD), stage III (moderate)   . Depression 1987  . Malignant neoplasm of overlapping sites of left breast in female, estrogen receptor positive ([place]) 03/14/2016   Dx in 09/2014, s/p bilateral mastectomies and ALND, 0/10 LN. 1.4 cm  Grade I invasive lobular, ER and PR +/Her--, Ki 67 <5% Tried anastrozole for one month, but developed suicidal idea  . Parkinson's disease ([place]) 2012  . Psoriatic arthritis ([place])   . PTSD (post-traumatic stress disorder)   . Secondary hyperparathyroidism ([place])   . Tardive dyskinesia     Past Surgical History:  Procedure Laterality Date  . ABDOMINAL HYSTERECTOMY  1987  . CHOLECYSTECTOMY  1979  . DILATION AND CURETTAGE OF UTERUS  1973  . MASTECTOMY Bilateral 09/19/2014  . TONSILLECTOMY  1970  . URETERAL REIMPLANTION Bilateral 1974   Family History:  Family History  Problem Relation Age of Onset   . Cancer Mother   . Cancer Sister   . Stroke Maternal Grandfather   . Diabetes Paternal Grandfather   . Cancer Sister    Family Psychiatric  History: See previous Social History:  Social History   Substance and Sexual Activity  Alcohol Use Never     Social History   Substance and Sexual Activity  Drug Use Never    Social History   Socioeconomic History  . Marital status: Married    Spouse name: Not on file  . Number of children: Not on file  . Years of education: Not on file  . Highest education level: Not on file  Occupational History  . Not on file  Tobacco Use  . Smoking status: Never Smoker  . Smokeless tobacco: Never Used  Vaping Use  . Vaping Use: Never used  Substance and Sexual Activity  . Alcohol use: Never  . Drug use: Never  . Sexual activity: Not Currently  Other Topics Concern  . Not on file  Social History Narrative   Moved to area from [place] 08/2018   Lives one story home   Right handed.   Social Determinants of Health   Financial Resource Strain:   . Difficulty of Paying Living Expenses:   Food Insecurity:   . Worried About Charity fundraiser in the Last Year:   . Arboriculturist in the Last Year:   Transportation Needs:   . Film/video editor (Medical):   Marland Kitchen Lack of Transportation (Non-Medical):   Physical Activity:   . Days of  Exercise per Week:   . Minutes of Exercise per Session:   Stress:   . Feeling of Stress :   Social Connections:   . Frequency of Communication with Friends and Family:   . Frequency of Social Gatherings with Friends and Family:   . Attends Religious Services:   . Active Member of Clubs or Organizations:   . Attends Archivist Meetings:   Marland Kitchen Marital Status:     Hospital Course: Admitted to psychiatric unit.  15-minute checks maintained.  Patient displayed no dangerous aggressive or hostile behavior while in the hospital.  She was cooperative with treatment.  Patient was on lamotrigine which  was gently increased to 200 mg while continuing her previous dose of Keppra.  After a couple days and still feeling unstable she and I discussed the pros and cons of restarting lithium.  Because of some worsening of kidney function we have gone with a very low dose of 150 mg a day only.  Patient is denying any suicidal or homicidal ideation and is not psychotic and agrees to outpatient treatment.  Discharged today with current prescriptions.  Physical Findings: AIMS:  , ,  ,  ,    CIWA:    COWS:     Musculoskeletal: Strength & Muscle Tone: within normal limits Gait & Station: unsteady Patient leans: N/A  Psychiatric Specialty Exam: Physical Exam Vitals and nursing note reviewed.  Constitutional:      Appearance: She is well-developed.  HENT:     Head: Normocephalic and atraumatic.  Eyes:     Conjunctiva/sclera: Conjunctivae normal.     Pupils: Pupils are equal, round, and reactive to light.  Cardiovascular:     Heart sounds: Normal heart sounds.  Pulmonary:     Effort: Pulmonary effort is normal.  Abdominal:     Palpations: Abdomen is soft.  Musculoskeletal:        General: Normal range of motion.     Cervical back: Normal range of motion.  Skin:    General: Skin is warm and dry.  Neurological:     General: No focal deficit present.     Mental Status: She is alert.  Psychiatric:        Mood and Affect: Mood normal.     Review of Systems  Constitutional: Negative.   HENT: Negative.   Eyes: Negative.   Respiratory: Negative.   Cardiovascular: Negative.   Gastrointestinal: Negative.   Musculoskeletal: Negative.   Skin: Negative.   Neurological: Negative.   Psychiatric/Behavioral: Negative.     Blood pressure 135/72, pulse 85, temperature 98.3 F (36.8 C), temperature source Oral, resp. rate 18, height 5\' 4"  (1.626 m), weight 93.9 kg, SpO2 100 %.Body mass index is 35.53 kg/m.  General Appearance: Casual  Eye Contact:  Fair  Speech:  Clear and Coherent  Volume:   Normal  Mood:  Euthymic  Affect:  Constricted  Thought Process:  Goal Directed  Orientation:  Full (Time, Place, and Person)  Thought Content:  Logical  Suicidal Thoughts:  No  Homicidal Thoughts:  No  Memory:  Immediate;   Fair Recent;   Fair Remote;   Fair  Judgement:  Fair  Insight:  Fair  Psychomotor Activity:  Normal  Concentration:  Concentration: Fair  Recall:  [employer] of Knowledge:  Fair  Language:  Fair  Akathisia:  No  Handed:  Right  AIMS (if indicated):     Assets:  Desire for Improvement Housing Resilience Social Support  ADL's:  Intact  Cognition:  WNL  Sleep:  Number of Hours: 8     Have you used any form of tobacco in the last 30 days? (Cigarettes, Smokeless Tobacco, Cigars, and/or Pipes): No  Has this patient used any form of tobacco in the last 30 days? (Cigarettes, Smokeless Tobacco, Cigars, and/or Pipes) Yes, No  Blood Alcohol level:  Lab Results  Component Value Date   ETH <10 09/08/2019   ETH <10 05/39/7673    Metabolic Disorder Labs:  Lab Results  Component Value Date   HGBA1C 5.3 09/10/2019   MPG 105 09/10/2019   No results found for: PROLACTIN Lab Results  Component Value Date   CHOL 193 09/10/2019   TRIG 96 09/10/2019   HDL 56 09/10/2019   CHOLHDL 3.4 09/10/2019   VLDL 19 09/10/2019   LDLCALC 118 (H) 09/10/2019    See Psychiatric Specialty Exam and Suicide Risk Assessment completed by Attending Physician prior to discharge.  Discharge destination:  Home  Is patient on multiple antipsychotic therapies at discharge:  No   Has Patient had three or more failed trials of antipsychotic monotherapy by history:  No  Recommended Plan for Multiple Antipsychotic Therapies: NA  Discharge Instructions    Diet - low sodium heart healthy   Complete by: As directed    Increase activity slowly   Complete by: As directed      Allergies as of 09/14/2019      Reactions   Aripiprazole Other (See Comments)   Parkinsonism     Lactose  Intolerance (gi) Diarrhea   Methotrexate Other (See Comments)   Hair loss, severe stomatitis   Cefdinir Diarrhea   Other reaction(s): Diarrhea Yeast infection and fever; negative c diff   Etanercept Other (See Comments)   Headaches   Exemestane Other (See Comments)   Suicidal thoughts with medication   Fluoxetine Other (See Comments)   Parkinsonism   Methylprednisolone Sodium Succ Other (See Comments)   Agitated mania   Epinephrine Palpitations   tachycardia   Nitrofurantoin Nausea And Vomiting, Rash          Medication List    STOP taking these medications   acetaminophen 500 MG tablet Commonly known as: TYLENOL   Cholecalciferol 50 MCG (2000 UT) Tabs   CIMZIA [place]   doxycycline 100 MG tablet Commonly known as: VIBRA-TABS   loperamide 2 MG capsule Commonly known as: IMODIUM     TAKE these medications     Indication  amLODipine 2.5 MG tablet Commonly known as: NORVASC Take 1 tablet (2.5 mg total) by mouth daily.  Indication: High Blood Pressure Disorder   calcitRIOL 0.25 MCG capsule Commonly known as: ROCALTROL Take 1 capsule (0.25 mcg total) by mouth every Monday, Wednesday, and Friday. Start taking on: September 15, 2019  Indication: Low Amount of Calcium in the Blood   hydrOXYzine 10 MG tablet Commonly known as: ATARAX/VISTARIL Take 1 tablet (10 mg total) by mouth 3 (three) times daily as needed for anxiety.  Indication: Feeling Anxious   lamoTRIgine 200 MG tablet Commonly known as: LAMICTAL Take 1 tablet (200 mg total) by mouth at bedtime. What changed:   medication strength  how much to take  Indication: Depressive Phase of Manic-Depression   levETIRAcetam 250 MG tablet Commonly known as: KEPPRA Take 1 tablet (250 mg total) by mouth 2 (two) times daily.  Indication: Seizure   levothyroxine 112 MCG tablet Commonly known as: SYNTHROID Take 1 tablet (112 mcg total) by mouth daily at 6 (six) AM.  Indication: Underactive Thyroid   lithium carbonate  150 MG capsule Take 1 capsule (150 mg total) by mouth at bedtime.  Indication: Depressive Phase of Manic-Depression   LORazepam 1 MG tablet Commonly known as: ATIVAN Take 1 tablet (1 mg total) by mouth at bedtime as needed for sleep. What changed:   when to take this  reasons to take this  Indication: Feeling Anxious, Trouble Sleeping   sodium bicarbonate 650 MG tablet Take 1 tablet (650 mg total) by mouth 2 (two) times daily.  Indication: Heartburn       Follow-up Information    [facility] up on 09/22/2019.   Specialty: Behavioral Health Why: You are scheduled for an in person appointment with Dr. Bess Kinds on Wednesday, July 14th at 940am. Thank you. Contact information: [place] [address] [phone]               Follow-up recommendations:  Activity:  Activity as tolerated Diet:  Regular diet Other:  Follow-up with [facility]  Comments: Prescriptions given at discharge  Signed: Alethia Berthold, MD 09/14/2019, 10:19 AM

## 2019-09-14 NOTE — Telephone Encounter (Signed)
I don't see a discharge summary yet. Lets keep the current appointment so that any changes to medications have time to help with mood symptoms. Have her call when she gets home and I will call her.

## 2019-09-14 NOTE — BHH Suicide Risk Assessment (Signed)
[facility] Discharge Suicide Risk Assessment   Principal Problem: Bipolar affective disorder, mixed ([place]) Discharge Diagnoses: Principal Problem:   Bipolar affective disorder, mixed ([place]) Active Problems:   Psoriatic arthritis ([place])   Essential hypertension   Severe recurrent major depression without psychotic features ([place])   Total Time spent with patient: 30 minutes  Musculoskeletal: Strength & Muscle Tone: within normal limits Gait & Station: normal Patient leans: Right  Psychiatric Specialty Exam: Review of Systems  Constitutional: Negative.   HENT: Negative.   Eyes: Negative.   Respiratory: Negative.   Cardiovascular: Negative.   Gastrointestinal: Negative.   Musculoskeletal: Negative.   Skin: Negative.   Neurological: Negative.   Psychiatric/Behavioral: Negative.     Blood pressure 135/72, pulse 85, temperature 98.3 F (36.8 C), temperature source Oral, resp. rate 18, height 5\' 4"  (1.626 m), weight 93.9 kg, SpO2 100 %.Body mass index is 35.53 kg/m.  General Appearance: Casual  Eye Contact::  Good  Speech:  Clear and [id number]  Volume:  Normal  Mood:  Euthymic  Affect:  Congruent  Thought Process:  Goal Directed  Orientation:  Full (Time, Place, and Person)  Thought Content:  Logical  Suicidal Thoughts:  No  Homicidal Thoughts:  No  Memory:  Immediate;   Fair Recent;   Fair Remote;   Fair  Judgement:  Fair  Insight:  Fair  Psychomotor Activity:  Normal  Concentration:  Fair  Recall:  [employer] of [place]  Language: Fair  Akathisia:  No  Handed:  Right  AIMS (if indicated):     Assets:  Desire for Improvement Financial Resources/Insurance Housing Resilience Social Support  Sleep:  Number of Hours: 8  Cognition: WNL  ADL's:  Intact   Mental Status Per Nursing Assessment::   On Admission:  NA  Demographic Factors:  Caucasian  Loss Factors: NA  Historical Factors: NA  Risk Reduction Factors:   Living with another person, especially  a relative, Positive social support and Positive therapeutic relationship  Continued Clinical Symptoms:  Bipolar Disorder:   Mixed State  Cognitive Features That Contribute To Risk:  None    Suicide Risk:  Minimal: No identifiable suicidal ideation.  Patients presenting with no risk factors but with morbid ruminations; may be classified as minimal risk based on the severity of the depressive symptoms   Follow-up Information    [facility] up on 09/22/2019.   Specialty: Behavioral Health Why: You are scheduled for an in person appointment with Dr. Bess Kinds on Wednesday, July 14th at 940am. Thank you. Contact information: [place] [address] [phone]               Plan Of Care/Follow-up recommendations:  Activity:  Activity as tolerated Diet:  Regular diet Other:  Follow-up with outpatient treatment as recommended at [facility], MD 09/14/2019, 10:10 AM

## 2019-09-14 NOTE — Telephone Encounter (Signed)
Noted. Will call this evening.

## 2019-09-14 NOTE — Tx Team (Signed)
Interdisciplinary Treatment and Diagnostic Plan Update  09/14/2019 Time of Session: 9:00AM Tiffany [name] MRN: [id number]  Principal Diagnosis: Bipolar affective disorder, mixed ([place])  Secondary Diagnoses: Principal Problem:   Bipolar affective disorder, mixed ([place]) Active Problems:   Psoriatic arthritis ([place])   Essential hypertension   Severe recurrent major depression without psychotic features ([place])   Current Medications:  Current Facility-Administered Medications  Medication Dose Route Frequency Provider Last Rate Last Admin   acetaminophen (TYLENOL) tablet 650 mg  650 mg Oral Q6H PRN [name], Tiffany Reno, MD   650 mg at 09/14/19 0809   alum & mag hydroxide-simeth (MAALOX/MYLANTA) 200-200-20 MG/5ML suspension 30 mL  30 mL Oral Q4H PRN [name], Tiffany Reno, MD       amLODipine (NORVASC) tablet 2.5 mg  2.5 mg Oral Daily [name], Tiffany T, MD   2.5 mg at 09/14/19 6213   calcitRIOL (ROCALTROL) capsule 0.25 mcg  0.25 mcg Oral Q M,W,F [name], Tiffany T, MD   0.25 mcg at 09/13/19 1056   hydrOXYzine (ATARAX/VISTARIL) tablet 10 mg  10 mg Oral TID PRN [name], Tiffany Reno, MD   10 mg at 09/10/19 0865   lamoTRIgine (LAMICTAL) tablet 200 mg  200 mg Oral QHS [name], Tiffany T, MD   200 mg at 09/13/19 2140   levETIRAcetam (KEPPRA) tablet 250 mg  250 mg Oral BID [name], Tiffany T, MD   250 mg at 09/14/19 0809   levothyroxine (SYNTHROID) tablet 112 mcg  112 mcg Oral Q0600 [name], Tiffany Reno, MD   112 mcg at 09/14/19 7846   lithium carbonate capsule 150 mg  150 mg Oral QHS [name], Tiffany T, MD   150 mg at 09/13/19 2140   loperamide (IMODIUM) capsule 2 mg  2 mg Oral PRN [name], Tiffany Reno, MD   2 mg at 09/11/19 0854   LORazepam (ATIVAN) tablet 0.5 mg  0.5 mg Oral Q6H PRN [name], Tiffany T, MD   0.5 mg at 09/13/19 1420   LORazepam (ATIVAN) tablet 1 mg  1 mg Oral QHS PRN [name], Tiffany Reno, MD   1 mg at 09/13/19 2141   magnesium hydroxide (MILK OF MAGNESIA) suspension 30 mL  30 mL Oral Daily PRN [name], Tiffany Reno, MD        sodium bicarbonate tablet 650 mg  650 mg Oral BID [name], Tiffany Reno, MD   650 mg at 09/14/19 0808   PTA Medications: Medications Prior to Admission  Medication Sig Dispense Refill Last Dose   acetaminophen (TYLENOL) 500 MG tablet Take 1,000 mg by mouth every 6 (six) hours as needed for mild pain or headache.      Certolizumab Pegol (CIMZIA [place]) Inject 200 mg into the skin every 30 (thirty) days.      Cholecalciferol 50 MCG (2000 UT) TABS Take 1 tablet (2,000 Units total) by mouth See admin instructions. Take 1 tablet by mouth daily (Patient taking differently: Take 1 tablet by mouth daily. ) 30 tablet 0    doxycycline (VIBRA-TABS) 100 MG tablet Take 1 tablet (100 mg total) by mouth 2 (two) times daily. (Patient not taking: Reported on 09/07/2019) 20 tablet 0    lamoTRIgine (LAMICTAL) 150 MG tablet Take 1 tablet (150 mg total) by mouth at bedtime. 90 tablet 0    levETIRAcetam (KEPPRA) 250 MG tablet TAKE 1 TABLET(250 MG) BY MOUTH TWICE DAILY (Patient taking differently: Take 250 mg by mouth 2 (two) times daily. ) 180 tablet 3    loperamide (IMODIUM) 2 MG capsule Take 1 capsule (2  mg total) by mouth as needed for diarrhea or loose stools. (Patient not taking: Reported on 09/07/2019) 30 capsule 0    LORazepam (ATIVAN) 1 MG tablet Take 1 tablet (1 mg total) by mouth 2 (two) times daily. 60 tablet 2    [DISCONTINUED] amLODipine (NORVASC) 2.5 MG tablet Take 1 tablet (2.5 mg total) by mouth daily. 90 tablet 3    [DISCONTINUED] calcitRIOL (ROCALTROL) 0.25 MCG capsule Take 0.25 mcg by mouth every Monday, Wednesday, and Friday.       [DISCONTINUED] hydrOXYzine (ATARAX/VISTARIL) 10 MG tablet Take 1 tablet (10 mg total) by mouth 3 (three) times daily as needed for anxiety. 30 tablet 0    [DISCONTINUED] levothyroxine (SYNTHROID) 112 MCG tablet Take 1 tablet (112 mcg total) by mouth daily at 6 (six) AM. 90 tablet 3    [DISCONTINUED] sodium bicarbonate 650 MG tablet Take 1 tablet (650 mg total) by mouth 2  (two) times daily. 60 tablet 0     Patient Stressors: Medication change or noncompliance  Patient Strengths: Capable of independent living Scientist, research (life sciences) Supportive family/friends  Treatment Modalities: Medication Management, Group therapy, Case management,  1 to 1 session with clinician, Psychoeducation, Recreational therapy.   Physician Treatment Plan for Primary Diagnosis: Bipolar affective disorder, mixed ([place]) Long Term Goal(s): Improvement in symptoms so as ready for discharge Improvement in symptoms so as ready for discharge   Short Term Goals: Ability to verbalize feelings will improve Ability to disclose and discuss suicidal ideas Ability to demonstrate self-control will improve Compliance with prescribed medications will improve  Medication Management: Evaluate patient's response, side effects, and tolerance of medication regimen.  Therapeutic Interventions: 1 to 1 sessions, Unit Group sessions and Medication administration.  Evaluation of Outcomes: Adequate for Discharge  Physician Treatment Plan for Secondary Diagnosis: Principal Problem:   Bipolar affective disorder, mixed ([place]) Active Problems:   Psoriatic arthritis ([place])   Essential hypertension   Severe recurrent major depression without psychotic features ([place])  Long Term Goal(s): Improvement in symptoms so as ready for discharge Improvement in symptoms so as ready for discharge   Short Term Goals: Ability to verbalize feelings will improve Ability to disclose and discuss suicidal ideas Ability to demonstrate self-control will improve Compliance with prescribed medications will improve     Medication Management: Evaluate patient's response, side effects, and tolerance of medication regimen.  Therapeutic Interventions: 1 to 1 sessions, Unit Group sessions and Medication administration.  Evaluation of Outcomes: Adequate for Discharge   RN Treatment Plan for Primary Diagnosis: Bipolar affective  disorder, mixed ([place]) Long Term Goal(s): Knowledge of disease and therapeutic regimen to maintain health will improve  Short Term Goals: Ability to demonstrate self-control, Ability to participate in decision making will improve, Ability to verbalize feelings will improve, Ability to disclose and discuss suicidal ideas, Ability to identify and develop effective coping behaviors will improve and Compliance with prescribed medications will improve  Medication Management: RN will administer medications as ordered by provider, will assess and evaluate patient's response and provide education to patient for prescribed medication. RN will report any adverse and/or side effects to prescribing provider.  Therapeutic Interventions: 1 on 1 counseling sessions, Psychoeducation, Medication administration, Evaluate responses to treatment, Monitor vital signs and CBGs as ordered, Perform/monitor CIWA, COWS, AIMS and Fall Risk screenings as ordered, Perform wound care treatments as ordered.  Evaluation of Outcomes: Adequate for Discharge   LCSW Treatment Plan for Primary Diagnosis: Bipolar affective disorder, mixed ([place]) Long Term Goal(s): Safe transition to appropriate next level of  care at discharge, Engage patient in therapeutic group addressing interpersonal concerns.  Short Term Goals: Engage patient in aftercare planning with referrals and resources, Increase social support, Increase emotional regulation, Facilitate acceptance of mental health diagnosis and concerns and Increase skills for wellness and recovery  Therapeutic Interventions: Assess for all discharge needs, 1 to 1 time with Social worker, Explore available resources and support systems, Assess for adequacy in community support network, Educate family and significant other(s) on suicide prevention, Complete Psychosocial Assessment, Interpersonal group therapy.  Evaluation of Outcomes: Adequate for Discharge   Progress in  Treatment: Attending groups: No. Participating in groups: No. Taking medication as prescribed: Yes. Toleration medication: Yes. Family/Significant other contact made: Yes, individual(s) contacted:  SPE completed with pt's husband Patient understands diagnosis: Yes. Discussing patient identified problems/goals with staff: Yes. Medical problems stabilized or resolved: Yes. Denies suicidal/homicidal ideation: Yes. Issues/concerns per patient self-inventory: No. Other: none  New problem(s) identified: No, Describe:  none  New Short Term/Long Term Goal(s): detox, elimination of symptoms of psychosis, medication management for mood stabilization; elimination of SI thoughts; development of comprehensive mental wellness/sobriety plan.  Patient Goals:  "stabilize my mood and start the process on getting back on my meds"  Discharge Plan or Barriers: Patient has plans to follow up with her current provider [facility].  Patient reports that she will return to the home with her husband.   Reason for Continuation of Hospitalization: Anxiety Depression Medication stabilization Suicidal ideation  Estimated Length of Stay: 1-7 days  Attendees: Patient: Tiffany [name] 09/14/2019 11:33 AM  Physician: Dr. Weber Cooks, MD 09/14/2019 11:33 AM  Nursing: Collier Bullock, RN 09/14/2019 11:33 AM  RN Care Manager: 09/14/2019 11:33 AM  Social Worker: Assunta Curtis, LCSW 09/14/2019 11:33 AM  Recreational Therapist: Devin Going, LRT 09/14/2019 11:33 AM  Other: Sanjuana Kava, LCSW 09/14/2019 11:33 AM  Other:  09/14/2019 11:33 AM  Other: 09/14/2019 11:33 AM    Scribe for Treatment Team: Rozann Lesches, LCSW 09/14/2019 11:33 AM

## 2019-09-14 NOTE — Plan of Care (Signed)
  Problem: Group Participation Goal: STG - Patient will engage in groups without prompting or encouragement from LRT x3 group sessions within 5 recreation therapy group sessions Description: STG - Patient will engage in groups without prompting or encouragement from LRT x3 group sessions within 5 recreation therapy group sessions Outcome: Completed/Met

## 2019-09-14 NOTE — Progress Notes (Signed)
Recreation Therapy Notes   Date: 09/14/2019  Time: 9:30 am  Location: Courtyard    Behavioral response: Appropriate  Intervention Topic: Social skills  Discussion/Intervention:  Group content on today was focused on social skills. The group defined social skills and identified ways they use social skills. Patients expressed what obstacles they face when trying to be social. Participants described the importance of social skills. The group listed ways to improve social skills and reasons to improve social skills. Individuals had an opportunity to learn new and improve social skills as well as identify their weaknesses. Clinical Observations/Feedback:  Patient came to group and was actively social with peers and staff while participating in the intervention.  Rilen Shukla LRT/CTRS         Kalli Greenfield 09/14/2019 11:00 AM

## 2019-09-14 NOTE — Progress Notes (Signed)
  [facility] Adult Case Management Discharge Plan :  Will you be returning to the same living situation after discharge:  Yes,  pt reports that she is returning home. At discharge, do you have transportation home?: Yes,  pt reports that her husband will provide transportation.  Do you have the ability to pay for your medications: Yes,  Medicare part A and B  Release of information consent forms completed and in the chart;  Patient's signature needed at discharge.  Patient to Follow up at:  Follow-up Information    Group, [facility] Follow up on 09/22/2019.   Specialty: Behavioral Health Why: You are scheduled for an in person appointment with Dr. Bess Kinds on Wednesday, July 14th at 940am. Thank you. Contact information: [place] [address] [phone]               Next level of care provider has access to [place] and Suicide Prevention discussed: Yes,  SPE completed with patient and husband.  Have you used any form of tobacco in the last 30 days? (Cigarettes, Smokeless Tobacco, Cigars, and/or Pipes): No  Has patient been referred to the Quitline?: N/A patient is not a smoker  Patient has been referred for addiction treatment: [place], LCSW 09/14/2019, 11:05 AM

## 2019-09-15 ENCOUNTER — Ambulatory Visit (INDEPENDENT_AMBULATORY_CARE_PROVIDER_SITE_OTHER): Payer: Medicare Other | Admitting: Psychology

## 2019-09-15 DIAGNOSIS — F341 Dysthymic disorder: Secondary | ICD-10-CM

## 2019-09-16 ENCOUNTER — Ambulatory Visit (INDEPENDENT_AMBULATORY_CARE_PROVIDER_SITE_OTHER): Payer: Medicare Other | Admitting: Family Medicine

## 2019-09-16 ENCOUNTER — Encounter: Payer: Self-pay | Admitting: Family Medicine

## 2019-09-16 ENCOUNTER — Ambulatory Visit (INDEPENDENT_AMBULATORY_CARE_PROVIDER_SITE_OTHER)
Admission: RE | Admit: 2019-09-16 | Discharge: 2019-09-16 | Disposition: A | Discharge status: Home / Self Care | Payer: Medicare Other | Source: Ambulatory Visit | Attending: Family Medicine | Admitting: Family Medicine

## 2019-09-16 ENCOUNTER — Other Ambulatory Visit: Payer: Self-pay

## 2019-09-16 VITALS — BP 126/70 | HR 68 | Temp 98.2°F | Ht 64.0 in | Wt 205.4 lb

## 2019-09-16 DIAGNOSIS — M25561 Pain in right knee: Secondary | ICD-10-CM

## 2019-09-16 MED ORDER — ACETAMINOPHEN-CODEINE #3 300-30 MG PO TABS: 1.0000 | ORAL_TABLET | Freq: Four times a day (QID) | ORAL | 0 refills | Status: DC | PRN

## 2019-09-16 NOTE — Progress Notes (Signed)
Patient: Tiffany [name] MRN: [id number] DOB: [birth year] PCP: Leamon Arnt, MD     Subjective:  Chief Complaint  Patient presents with  . Knee Pain    Right    HPI: The patient is a 69 y.o. female who presents today for right side knee pain. She was in ER in a bed and very anxious and was banging her right knee on the side rail of the bed-this was a week ago today. It hurt her the next day and she didn't take any medication. She states she started to have pain with walking and it still hurts her to walk. She has some swelling, but no redness. She can extend and flex her knee. She has only taken tylenol. She states it is getting worse. She is having more pain with walking and can only be on her feet about 10 minutes before she has to sit down. She has not seen anyone for her knee and didn't see anyone during hospital. She does not feel like it's a psoriatic arthritis flair. No history of trauma or injury, but has been told she needs a knee replacement.   Recently discharged on 09/14/19 from behavioral health. lithium restarted on low dose due to CKD.   Review of Systems  Constitutional: Negative for chills and fever.  Musculoskeletal: Positive for arthralgias (RIGHT KNEE PAIN ), gait problem and joint swelling.  Skin: Negative for wound.    Allergies Patient is allergic to aripiprazole, lactose intolerance (gi), methotrexate, cefdinir, etanercept, exemestane, fluoxetine, methylprednisolone sodium succ, epinephrine, and nitrofurantoin.  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  Surgical History Patient  has a past surgical history that includes Tonsillectomy (1970); Dilation and curettage of uterus (1973); Ureteral reimplantion (Bilateral, 1974); Cholecystectomy (1979); Abdominal hysterectomy (1987); and Mastectomy (Bilateral, 09/19/2014).  Family History Pateint's family history includes Cancer in her mother, sister, and sister; Diabetes in her paternal grandfather; Stroke in her maternal grandfather.  Social History Patient  reports that she has never smoked. She has never used smokeless tobacco. She reports that she does not drink alcohol and does not use drugs.    Objective: Vitals:   09/16/19 1430  BP: 126/70  Pulse: 68  Temp: 98.2 F (36.8 C)  TempSrc: Temporal  SpO2: 99%  Weight: 205 lb 6.4 oz (93.2 kg)  Height: 5\' 4"  (1.626 m)    Body mass index is 35.26 kg/m.  Physical Exam Vitals reviewed.  Constitutional:      Appearance: Normal appearance. She is obese.  HENT:     Head: Normocephalic and atraumatic.  Pulmonary:     Effort: Pulmonary effort is normal.  Musculoskeletal:     Comments: Right knee: no erythema or edema. She has TTP over lateral and medial aspects of her knee. Negative draw signs. No pain with varus or valgus strain. Can not fully extend to 180 degrees. Can flex to 90 degrees. mcmurray test performed in chair and limited, but no pain. Possible bakers cyst in popliteal fossa. Gait with limp, walks with walker  Skin:    Findings: No erythema.  Neurological:     Mental Status: She is alert.        Assessment/plan:  1. Acute pain of right knee Apparently known history of severe OA per patient. Unsure how forceful her banging of knee could have been vs. Bakers cyst. Will start with xray today. Recommended voltaren gel QID and RICE therapy. Will send in tylenol 3 for severe pain and discussed tylenol dosing. If no improvement over next several discussed mri vs. Referral.  - DG Knee Complete 4 Views Right;  Future    This visit occurred during the SARS-CoV-2 public health emergency.  Safety protocols were in place, including screening questions prior to the visit, additional usage of staff PPE, and extensive cleaning of exam room while observing appropriate contact time as indicated for disinfecting solutions.     Return if symptoms worsen or fail to improve.   Orma Flaming, MD [place]   09/16/2019

## 2019-09-16 NOTE — Patient Instructions (Addendum)
I have ordered xrays for you. At this time we do not have xrays in our clinic. You will have to go to our [facility] clinic. The address is [address]  xray is located in the basement.  Hours of operation are M-F 8:30am to 5:00pm.  Closed for lunch between 12:30 and 1:00pm.   -get over the counter voltaren gel and can use 4x/day -tylenol 3 for pain (no more than 3000mg /24 hours) for tylenol dosing. DO not take with your lorazepam! -get knee brace  -if xray is normal and continue to hurt please let us know as you may need MRI vs. Ortho referral.    RICE Therapy for Routine Care of Injuries Many injuries can be cared for with rest, ice, compression, and elevation (RICE therapy). This includes:  Resting the injured part.  Putting ice on the injury.  Putting pressure (compression) on the injury.  Raising the injured part (elevation). Using RICE therapy can help to lessen pain and swelling. Supplies needed:  Ice.  Plastic bag.  Towel.  Elastic bandage.  Pillow or pillows to raise (elevate) your injured body part. How to care for your injury with RICE therapy Rest Limit your normal activities, and try not to use the injured part of your body. You can go back to your normal activities when your doctor says it is okay to do them and you feel okay. Ask your doctor if you should do exercises to help your injury get better. Ice Put ice on the injured area. Do not put ice on your bare skin.  Put ice in a plastic bag.  Place a towel between your skin and the bag.  Leave the ice on for 20 minutes, 2-3 times a day. Use ice on as many days as told by your doctor.  Compression Compression means putting pressure on the injured area. This can be done with an elastic bandage. If an elastic bandage has been put on your injury:  Do not wrap the bandage too tight. Wrap the bandage more loosely if part of your body away from the bandage is blue, swollen, cold, painful, or loses feeling (gets  numb).  Take off the bandage and put it on again. Do this every 3-4 hours or as told by your doctor.  See your doctor if the bandage seems to make your problems worse.  Elevation Elevation means keeping the injured area raised. If you can, raise the injured area above your heart or the center of your chest. Contact a doctor if:  You keep having pain and swelling.  Your symptoms get worse. Get help right away if:  You have sudden bad pain at your injury or lower than your injury.  You have redness or more swelling around your injury.  You have tingling or numbness at your injury or lower than your injury, and it does not go away when you take off the bandage. Summary  Many injuries can be cared for using rest, ice, compression, and elevation (RICE therapy).  You can go back to your normal activities when you feel okay and your doctor says it is okay.  Put ice on the injured area as told by your doctor.  Get help if your symptoms get worse or if you keep having pain and swelling. This information is not intended to replace advice given to you by your health care provider. Make sure you discuss any questions you have with your health care provider. Document Revised: 11/15/2016 Document Reviewed: 11/15/2016  Elsevier Patient Education  [employer].

## 2019-09-17 ENCOUNTER — Encounter: Payer: Self-pay | Admitting: Family Medicine

## 2019-09-20 ENCOUNTER — Ambulatory Visit (INDEPENDENT_AMBULATORY_CARE_PROVIDER_SITE_OTHER): Payer: Medicare Other | Admitting: Psychology

## 2019-09-20 DIAGNOSIS — F341 Dysthymic disorder: Secondary | ICD-10-CM | POA: Diagnosis not present

## 2019-09-21 ENCOUNTER — Telehealth: Payer: Self-pay

## 2019-09-21 NOTE — Telephone Encounter (Signed)
We have appt on the 16th; I will evaluate her then and we can then make a plan.  thanks

## 2019-09-21 NOTE — Telephone Encounter (Signed)
Pt was seen on the 8th by Dr Rogers Blocker for knee pain. She was told if it did not get better to call back for a potential MRI. She called this morning stating she has not improved. She wants to know what she should do next.

## 2019-09-22 ENCOUNTER — Ambulatory Visit (INDEPENDENT_AMBULATORY_CARE_PROVIDER_SITE_OTHER): Payer: Medicare Other | Admitting: Psychology

## 2019-09-22 ENCOUNTER — Other Ambulatory Visit: Payer: Self-pay

## 2019-09-22 ENCOUNTER — Encounter: Payer: Self-pay | Admitting: Adult Health

## 2019-09-22 ENCOUNTER — Ambulatory Visit (INDEPENDENT_AMBULATORY_CARE_PROVIDER_SITE_OTHER): Payer: Medicare Other | Admitting: Adult Health

## 2019-09-22 DIAGNOSIS — Z79899 Other long term (current) drug therapy: Secondary | ICD-10-CM

## 2019-09-22 DIAGNOSIS — F411 Generalized anxiety disorder: Secondary | ICD-10-CM | POA: Diagnosis not present

## 2019-09-22 DIAGNOSIS — F314 Bipolar disorder, current episode depressed, severe, without psychotic features: Secondary | ICD-10-CM

## 2019-09-22 DIAGNOSIS — F319 Bipolar disorder, unspecified: Secondary | ICD-10-CM

## 2019-09-22 DIAGNOSIS — F4310 Post-traumatic stress disorder, unspecified: Secondary | ICD-10-CM

## 2019-09-22 DIAGNOSIS — G4700 Insomnia, unspecified: Secondary | ICD-10-CM | POA: Diagnosis not present

## 2019-09-22 DIAGNOSIS — F331 Major depressive disorder, recurrent, moderate: Secondary | ICD-10-CM

## 2019-09-22 NOTE — Progress Notes (Signed)
Skyla Champagne Cham [id number] [birth year] 69 y.o.  Subjective:   Patient ID:  Tiffany [name] is a 69 y.o. (DOB [birth year]) female.  Chief Complaint: No chief complaint on file.   HPI Tiffany [name] presents to the office today for follow-up of PTSD, insomnia,GAD, BPD 1.  Describes mood today as "ok". Pleasant. Mood symptoms - decreased depression, anxiety, and irritability. Stating "I felt like I was doing better until last night". Stating "I had a high anxiety night". Does feel "better" overall since hospitalization. No longer having suicidal thoughts. Frustrated with being in pain - leg and arthritis. Has a yearly check up this coming Friday and plans to have leg re-evaluated. Varying interest and motivation. Taking medications as prescribed.  Energy levels stable. Active, unable to exercise with leg pain. Enjoys some usual interests and activities. Married. Lives with husband of 24 years and their daughter. Talking to family and friends.  Appetite increased. Weight gain - 5 pounds - 205 pounds.  Sleeping better at nights. Averages 8 to 10 hours hours - up and down during the night. Napping during the day some days.  Focus and concentration difficulties. Word finding issues. Completing tasks. Managing some aspects of household -   cleaning, cooking, laundry.  Denies SI or HI. Denies AH or VH.   Previous medications: Celexa, Zyprexa, Tegretol, Depakote, Serzone, Topamax, Seroquel, Effexor, Lexapro, Desipramine, Neurontin, Abilify, Geodon, Propanolol, Cymbalta, Cogentin, Trihexyphenadyl, Simet, Provigil, Selegiline, Requip, Amantadine, Prozac, Mirapex, Azilect, Metoclopramide, Baclofen, Artane, Namenda, Latuda.   GAD-7     Office Visit from 05/14/2019 in [facility]  Total GAD-7 Score 21    Mini-Mental     Office Visit from 06/07/2019 in [facility]  Total Score (max 30 points ) 29    PHQ2-9     Office Visit from 09/16/2019 in [facility] Visit from 08/24/2019 in [facility] Visit from 05/14/2019 in [facility]  PHQ-2 Total Score 0 0 1  PHQ-9 Total Score -- 4 8       Review of Systems:  Review of Systems  Musculoskeletal: Negative for gait problem.  Neurological: Negative for tremors.  Psychiatric/Behavioral:       Please refer to HPI    Medications: I have reviewed the patient's current medications.  Current Outpatient Medications  Medication Sig Dispense Refill  . acetaminophen-codeine (TYLENOL #3) 300-30 MG tablet Take 1 tablet by mouth every 6 (six) hours as needed for severe pain. 15 tablet 0  . amLODipine (NORVASC) 2.5 MG tablet Take 1 tablet (2.5 mg total) by mouth daily. 30 tablet 1  . calcitRIOL (ROCALTROL) 0.25 MCG capsule Take 1 capsule (0.25 mcg total) by mouth every Monday, Wednesday, and Friday. 12 capsule 0  . hydrOXYzine (ATARAX/VISTARIL) 10 MG tablet Take 1 tablet (10 mg total) by mouth 3 (three) times daily as needed for anxiety. (Patient not taking: Reported on 09/16/2019) 60 tablet 1  . lamoTRIgine (LAMICTAL) 200 MG tablet Take 1 tablet (200 mg total) by mouth at bedtime. 30 tablet 1  . levETIRAcetam (KEPPRA) 250 MG tablet Take 1 tablet (250 mg total) by mouth 2 (two) times daily. 60 tablet 1  . levothyroxine (SYNTHROID) 112 MCG tablet Take 1 tablet (112 mcg total) by mouth daily at 6 (six) AM. 30 tablet 1  . lithium carbonate 150 MG capsule Take 1 capsule (150 mg total) by mouth at bedtime. 30 capsule 1  . LORazepam (ATIVAN) 1 MG tablet Take 1  tablet (1 mg total) by mouth at bedtime as needed for sleep. 30 tablet 1  . sodium bicarbonate 650 MG tablet Take 1 tablet (650 mg total) by mouth 2 (two) times daily. 60 tablet 0   No current facility-administered medications for this visit.    Medication Side Effects: None  Allergies:  Allergies  Allergen Reactions  . Aripiprazole Other (See Comments)     Parkinsonism     . Lactose Intolerance (Gi) Diarrhea  . Methotrexate Other (See Comments)    Hair loss, severe stomatitis    . Cefdinir Diarrhea    Other reaction(s): Diarrhea Yeast infection and fever; negative c diff  . Etanercept Other (See Comments)    Headaches    . Exemestane Other (See Comments)    Suicidal thoughts with medication    . Fluoxetine Other (See Comments)    Parkinsonism  . Methylprednisolone Sodium Succ Other (See Comments)    Agitated mania  . Epinephrine Palpitations    tachycardia   . Nitrofurantoin Nausea And Vomiting and Rash         Past Medical History:  Diagnosis Date  . Bipolar 1 disorder ([place])   . Breast cancer ([place])   . Chronic diarrhea    loose stools twice a day on average for years  . Chronic kidney disease (CKD), stage III (moderate)   . Depression 1987  . Malignant neoplasm of overlapping sites of left breast in female, estrogen receptor positive ([place]) 03/14/2016   Dx in 09/2014, s/p bilateral mastectomies and ALND, 0/10 LN. 1.4 cm  Grade I invasive lobular, ER and PR +/Her--, Ki 67 <5% Tried anastrozole for one month, but developed suicidal idea  . Parkinson's disease ([place]) 2012  . Psoriatic arthritis ([place])   . PTSD (post-traumatic stress disorder)   . Secondary hyperparathyroidism ([place])   . Tardive dyskinesia     Family History  Problem Relation Age of Onset  . Cancer Mother   . Cancer Sister   . Stroke Maternal Grandfather   . Diabetes Paternal Grandfather   . Cancer Sister     Social History   Socioeconomic History  . Marital status: Married    Spouse name: Not on file  . Number of children: Not on file  . Years of education: Not on file  . Highest education level: Not on file  Occupational History  . Not on file  Tobacco Use  . Smoking status: Never Smoker  . Smokeless tobacco: Never Used  Vaping Use  . Vaping Use: Never used  Substance and Sexual Activity  . Alcohol use: Never  . Drug use: Never  .  Sexual activity: Not Currently  Other Topics Concern  . Not on file  Social History Narrative   Moved to area from [place] 08/2018   Lives one story home   Right handed.   Social Determinants of Health   Financial Resource Strain:   . Difficulty of Paying Living Expenses:   Food Insecurity:   . Worried About Charity fundraiser in the Last Year:   . Arboriculturist in the Last Year:   Transportation Needs:   . Film/video editor (Medical):   Marland Kitchen Lack of Transportation (Non-Medical):   Physical Activity:   . Days of Exercise per Week:   . Minutes of Exercise per Session:   Stress:   . Feeling of Stress :   Social Connections:   . Frequency of Communication with Friends and Family:   . Frequency  of Social Gatherings with Friends and Family:   . Attends Religious Services:   . Active Member of Clubs or Organizations:   . Attends Archivist Meetings:   Marland Kitchen Marital Status:   Intimate Partner Violence:   . Fear of Current or Ex-Partner:   . Emotionally Abused:   Marland Kitchen Physically Abused:   . Sexually Abused:     Past Medical History, Surgical history, Social history, and Family history were reviewed and updated as appropriate.   Please see review of systems for further details on the patient's review from today.   Objective:   Physical Exam:  There were no vitals taken for this visit.  Physical Exam Constitutional:      General: She is not in acute distress. Musculoskeletal:        General: No deformity.  Neurological:     Mental Status: She is alert and oriented to person, place, and time.     Coordination: Coordination normal.  Psychiatric:        Attention and Perception: Attention and perception normal. She does not perceive auditory or visual hallucinations.        Mood and Affect: Mood normal. Mood is not anxious or depressed. Affect is not labile, blunt, angry or inappropriate.        Speech: Speech normal.        Behavior: Behavior normal.         Thought Content: Thought content normal. Thought content is not paranoid or delusional. Thought content does not include homicidal or suicidal ideation. Thought content does not include homicidal or suicidal plan.        Cognition and Memory: Cognition and memory normal.        Judgment: Judgment normal.     Comments: Insight intact     Lab Review:     Component Value Date/Time   NA 139 09/13/2019 1243   NA 138 06/08/2019 0000   K 4.2 09/13/2019 1243   CL 104 09/13/2019 1243   CO2 25 09/13/2019 1243   GLUCOSE 100 (H) 09/13/2019 1243   BUN 34 (H) 09/13/2019 1243   BUN 29 (A) 06/08/2019 0000   CREATININE 2.20 (H) 09/13/2019 1243   CALCIUM 9.2 09/13/2019 1243   PROT 7.9 09/10/2019 0851   ALBUMIN 4.0 09/10/2019 0851   AST 19 09/10/2019 0851   ALT 15 09/10/2019 0851   ALKPHOS 125 09/10/2019 0851   BILITOT 1.3 (H) 09/10/2019 0851   GFRNONAA 22 (L) 09/13/2019 1243   GFRAA 25 (L) 09/13/2019 1243       Component Value Date/Time   WBC 13.0 (H) 09/08/2019 1347   RBC 4.95 09/08/2019 1347   HGB 14.4 09/08/2019 1347   HCT 46.0 09/08/2019 1347   PLT 220 09/08/2019 1347   MCV 92.9 09/08/2019 1347   MCH 29.1 09/08/2019 1347   MCHC 31.3 09/08/2019 1347   RDW 13.1 09/08/2019 1347   LYMPHSABS 2.5 04/27/2019 0544   MONOABS 0.5 04/27/2019 0544   EOSABS 0.2 04/27/2019 0544   BASOSABS 0.0 04/27/2019 0544    Lithium Lvl  Date Value Ref Range Status  04/21/2019 0.79 0.60 - 1.20 mmol/L Final    Comment:    Performed at [facility], [place] [address] [place], [place] [address]     No results found for: PHENYTOIN, PHENOBARB, VALPROATE, CBMZ   .res Assessment: Plan:    Plan:  Increase Lorazepam 1mg  at HS to BID for anxiety Add Ingrezza 40mg  daily - has medication Hydroxyzine  10mg  TID for anxiety Lamictal 200mg  at hs Lithium 150mg  daily   Lithium level - lab slip given  Also taking Keppra 250mg  BID for seizures  Therapist - [name] - seeing every 2 weeks  RTC 4  weeks   Diagnoses and all orders for this visit:  High risk medication use -     Lithium level  Insomnia, unspecified type  Generalized anxiety disorder  PTSD (post-traumatic stress disorder)  Major depressive disorder, recurrent episode, moderate ([place])  Bipolar I disorder ([place])     Please see After Visit Summary for patient specific instructions.  Future Appointments  Date Time Provider [place]  09/22/2019  1:00 PM Cottle, Bambi G, LCSW [facility] None  09/24/2019  1:00 PM Leamon Arnt, MD [facility]  09/29/2019  1:00 PM Cottle, Bambi G, LCSW [facility] None  10/13/2019  1:00 PM Cottle, Bambi G, LCSW [facility] None  10/20/2019  1:00 PM Cottle, Bambi G, LCSW [facility] None  11/12/2019  1:00 PM Warren Danes, PA-C [facility]  12/14/2019 11:30 AM Cameron Sprang, MD [facility] None    Orders Placed This Encounter  Procedures  . Lithium level    -------------------------------

## 2019-09-24 ENCOUNTER — Encounter: Payer: Self-pay | Admitting: Family Medicine

## 2019-09-24 ENCOUNTER — Telehealth: Payer: Self-pay | Admitting: Family Medicine

## 2019-09-24 ENCOUNTER — Other Ambulatory Visit: Payer: Self-pay

## 2019-09-24 ENCOUNTER — Ambulatory Visit (INDEPENDENT_AMBULATORY_CARE_PROVIDER_SITE_OTHER): Payer: Medicare Other | Admitting: Family Medicine

## 2019-09-24 VITALS — BP 120/76 | HR 72 | Temp 96.4°F | Resp 15 | Ht 64.0 in | Wt 211.2 lb

## 2019-09-24 DIAGNOSIS — I10 Essential (primary) hypertension: Secondary | ICD-10-CM

## 2019-09-24 DIAGNOSIS — F3160 Bipolar disorder, current episode mixed, unspecified: Secondary | ICD-10-CM

## 2019-09-24 DIAGNOSIS — C50812 Malignant neoplasm of overlapping sites of left female breast: Secondary | ICD-10-CM

## 2019-09-24 DIAGNOSIS — R45851 Suicidal ideations: Secondary | ICD-10-CM | POA: Diagnosis not present

## 2019-09-24 DIAGNOSIS — L4050 Arthropathic psoriasis, unspecified: Secondary | ICD-10-CM | POA: Diagnosis not present

## 2019-09-24 DIAGNOSIS — Z86010 Personal history of colonic polyps: Secondary | ICD-10-CM

## 2019-09-24 DIAGNOSIS — M1711 Unilateral primary osteoarthritis, right knee: Secondary | ICD-10-CM | POA: Diagnosis not present

## 2019-09-24 DIAGNOSIS — N184 Chronic kidney disease, stage 4 (severe): Secondary | ICD-10-CM | POA: Insufficient documentation

## 2019-09-24 DIAGNOSIS — M1710 Unilateral primary osteoarthritis, unspecified knee: Secondary | ICD-10-CM | POA: Insufficient documentation

## 2019-09-24 DIAGNOSIS — Z170 Estrogen receptor positive status [ER+]: Secondary | ICD-10-CM

## 2019-09-24 DIAGNOSIS — G2401 Drug induced subacute dyskinesia: Secondary | ICD-10-CM

## 2019-09-24 DIAGNOSIS — M179 Osteoarthritis of knee, unspecified: Secondary | ICD-10-CM

## 2019-09-24 DIAGNOSIS — Z79899 Other long term (current) drug therapy: Secondary | ICD-10-CM

## 2019-09-24 HISTORY — DX: Osteoarthritis of knee, unspecified: M17.9

## 2019-09-24 HISTORY — DX: Unilateral primary osteoarthritis, unspecified knee: M17.10

## 2019-09-24 NOTE — Progress Notes (Signed)
Subjective  CC:  Chief Complaint  Patient presents with  . Manic Behavior  . Numbness  . Knee Pain    HPI: Tiffany [name] is a 69 y.o. female who presents to the office today to address the problems listed above in the chief complaint.  Follow-up right knee pain: Reviewed last office visit and x-ray: DJD right knee with effusion.  Patient with chronic psoriatic arthritis.  Fortunately, over the last several days, pain has resolved.  No locking or give way.  Swelling is mildly decreased.  She does report that her knee was swollen and warm at her last office visit with her rheumatologist and it was attributed to her psoriatic arthritis.  She is no longer requiring pain medicines.  Bipolar disorder and suicidal ideation: Reviewed hospital notes and behavioral health notes from recent hospitalization for suicidal behavior.  She states her fluctuation in moods continue.  Psychiatry we started low-dose lithium.  She is using Xanax as needed for agitation.  She thinks about suicide but will not act on it.  Blood pressure remains controlled.  Chronic kidney disease: Reviewed recent lab works, creatinine is stable.  She has follow-up with nephrology soon.  No lower extremity edema.  History of metabolic encephalopathy due to possible lithium toxicity and other factors.  Patient knows that she needs to be cautious with this medication.  She has a lab appointment for Monday to check levels.  Assessment  1. Psoriatic arthritis ([place])   2. Primary osteoarthritis of right knee   3. Bipolar affective disorder, mixed ([place])   4. Suicidal ideation   5. High risk medication use   6. Essential hypertension   7. Tardive dyskinesia   8. CKD (chronic kidney disease) stage 4, GFR 15-29 ml/min (HCC)      Plan   Psoriatic arthritis and DJD of right knee: Improving.  No new medications needed at this time.  No further evaluation needed at this time.  Mood disorder: Per psychiatry.  Monitor medication  levels closely to avoid side effects and/or adverse effects.  Patient and her husband are aware.  To emergency room for any further worsening of suicidal ideation  Blood pressures well controlled  Chronic kidney disease remains at baseline.  Follow-up with nephrology as scheduled  Health maintenance up-to-date  Follow up: 6 months for complete physical with lab work Visit date not found  No orders of the defined types were placed in this encounter.  No orders of the defined types were placed in this encounter.     I reviewed the patients updated PMH, FH, and SocHx.    Patient Active Problem List   Diagnosis Date Noted  . Chronic diarrhea 05/05/2019    Priority: High  . Essential hypertension     Priority: High  . Seizures ([place])     Priority: High  . Encephalopathy 04/26/2019    Priority: High  . Tubular adenoma of colon 05/28/2017    Priority: High  . Chronic kidney disease, stage III (moderate) 01/19/2016    Priority: High  . Tardive dyskinesia 10/18/2015    Priority: High  . Bipolar affective disorder, mixed ([place]) 08/16/2004    Priority: High  . Acquired hypothyroidism 04/03/1996    Priority: High  . Osteoarthritis, knee 09/24/2019    Priority: Medium  . Thrombocytopenia ([place]) 09/04/2018    Priority: Medium  . History of breast cancer left 2016 12/27/2014    Priority: Medium  . Vitamin D deficiency 09/04/2018    Priority: Low  .  Psoriatic arthritis ([place]) 09/04/2018    Priority: Low  . 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 Osteopenia determined by x-ray 10/31/2014    Priority: Low  . Rosacea 05/21/2007    Priority: Low  . CKD (chronic kidney disease) stage 4, GFR 15-29 ml/min ([place]) 09/24/2019  . Severe recurrent major depression without psychotic features ([place]) 09/09/2019  . Bipolar I disorder, most recent episode depressed ([place])   . Transaminitis   . Leukocytosis 04/21/2019  . History of colonic  polyps 05/28/2017  . 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 acetaminophen-codeine (TYLENOL #3) 300-30 MG tablet Take 1 tablet by mouth every 6 (six) hours as needed for severe pain.  Marland Kitchen amLODipine (NORVASC) 2.5 MG tablet Take 1 tablet (2.5 mg total) by mouth daily.  . calcitRIOL (ROCALTROL) 0.25 MCG capsule Take 1 capsule (0.25 mcg total) by mouth every Monday, Wednesday, and Friday.  . Certolizumab Pegol (CIMZIA) 2 X 200 MG KIT   . hydrOXYzine (ATARAX/VISTARIL) 10 MG tablet Take 1 tablet (10 mg total) by mouth 3 (three) times daily as needed for anxiety.  . lamoTRIgine (LAMICTAL) 200 MG tablet Take 1 tablet (200 mg total) by mouth at bedtime.  . levETIRAcetam (KEPPRA) 250 MG tablet Take 1 tablet (250 mg total) by mouth 2 (two) times daily.  Marland Kitchen levothyroxine (SYNTHROID) 112 MCG tablet Take 1 tablet (112 mcg total) by mouth daily at 6 (six) AM.  . lithium carbonate 150 MG capsule Take 1 capsule (150 mg total) by mouth at bedtime.  Marland Kitchen LORazepam (ATIVAN) 1 MG tablet Take 1 tablet (1 mg total) by mouth at bedtime as needed for sleep.  . sodium bicarbonate 650 MG tablet Take 1 tablet (650 mg total) by mouth 2 (two) times daily.    Allergies: Patient is allergic to aripiprazole, lactose intolerance (gi), methotrexate, cefdinir, etanercept, exemestane, fluoxetine, methylprednisolone sodium succ, epinephrine, and nitrofurantoin. Family History: Patient family history includes Cancer in her mother, sister, and sister; Diabetes in her paternal grandfather; Stroke in her maternal grandfather. Social History:  Patient  reports that she has never smoked. She has never used smokeless tobacco. She reports that she does not drink alcohol and does not use drugs.  Review of Systems: Constitutional: Negative for fever malaise or anorexia Cardiovascular: negative for chest pain Respiratory: negative for SOB  or persistent cough Gastrointestinal: negative for abdominal pain  Objective  Vitals: BP 120/76   Pulse 72   Temp (!) 96.4 F (35.8 C) (Temporal)   Resp 15   Ht '5\' 4"'$  (1.626 m)   Wt 211 lb 3.2 oz (95.8 kg)   SpO2 98%   BMI 36.25 kg/m  General: no acute distress , A&Ox3 Psych: Stable affect, mild lipsmacking, normal speech Right knee: Warm without erythema, mild swelling, full range of motion without crepitus, mild medial joint line tenderness, no laxity, negative Lachman's and McMurray's, normal gait   No visits with results within 1 Day(s) from this visit.  Latest known visit with results is:  Admission on 09/09/2019, Discharged on 09/14/2019  Component Date Value Ref Range Status  . Hgb A1c MFr Bld 09/10/2019 5.3  4.8 - 5.6 % Final  . Mean Plasma Glucose 09/10/2019 105  mg/dL Final  . Cholesterol 09/10/2019 193  0 - 200 mg/dL Final  . Triglycerides 09/10/2019 96  <150 mg/dL Final  . HDL 09/10/2019  56  >40 mg/dL Final  . Total CHOL/HDL Ratio 09/10/2019 3.4  RATIO Final  . VLDL 09/10/2019 19  0 - 40 mg/dL Final  . LDL Cholesterol 09/10/2019 118* 0 - 99 mg/dL Final  . Sodium 09/10/2019 142  135 - 145 mmol/L Final  . Potassium 09/10/2019 4.6  3.5 - 5.1 mmol/L Final  . Chloride 09/10/2019 106  98 - 111 mmol/L Final  . CO2 09/10/2019 24  22 - 32 mmol/L Final  . Glucose, Bld 09/10/2019 133* 70 - 99 mg/dL Final  . BUN 09/10/2019 31* 8 - 23 mg/dL Final  . Creatinine, Ser 09/10/2019 2.08* 0.44 - 1.00 mg/dL Final  . Calcium 09/10/2019 9.4  8.9 - 10.3 mg/dL Final  . Total Protein 09/10/2019 7.9  6.5 - 8.1 g/dL Final  . Albumin 09/10/2019 4.0  3.5 - 5.0 g/dL Final  . AST 09/10/2019 19  15 - 41 U/L Final  . ALT 09/10/2019 15  0 - 44 U/L Final  . Alkaline Phosphatase 09/10/2019 125  38 - 126 U/L Final  . Total Bilirubin 09/10/2019 1.3* 0.3 - 1.2 mg/dL Final  . GFR calc non Af Amer 09/10/2019 24* >60 mL/min Final  . GFR calc Af Amer 09/10/2019 27* >60 mL/min Final  . Anion gap  09/10/2019 12  5 - 15 Final  . Sodium 09/13/2019 139  135 - 145 mmol/L Final  . Potassium 09/13/2019 4.2  3.5 - 5.1 mmol/L Final  . Chloride 09/13/2019 104  98 - 111 mmol/L Final  . CO2 09/13/2019 25  22 - 32 mmol/L Final  . Glucose, Bld 09/13/2019 100* 70 - 99 mg/dL Final  . BUN 09/13/2019 34* 8 - 23 mg/dL Final  . Creatinine, Ser 09/13/2019 2.20* 0.44 - 1.00 mg/dL Final  . Calcium 09/13/2019 9.2  8.9 - 10.3 mg/dL Final  . GFR calc non Af Amer 09/13/2019 22* >60 mL/min Final  . GFR calc Af Amer 09/13/2019 25* >60 mL/min Final  . Anion gap 09/13/2019 10  5 - 15 Final     Commons side effects, risks, benefits, and alternatives for medications and treatment plan prescribed today were discussed, and the patient expressed understanding of the given instructions. Patient is instructed to call or message via MyChart if he/she has any questions or concerns regarding our treatment plan. No barriers to understanding were identified. We discussed Red Flag symptoms and signs in detail. Patient expressed understanding regarding what to do in case of urgent or emergency type symptoms.   Medication list was reconciled, printed and provided to the patient in AVS. Patient instructions and summary information was reviewed with the patient as documented in the AVS. This note was prepared with assistance of Dragon voice recognition software. Occasional wrong-word or sound-a-like substitutions may have occurred due to the inherent limitations of voice recognition software  This visit occurred during the SARS-CoV-2 public health emergency.  Safety protocols were in place, including screening questions prior to the visit, additional usage of staff PPE, and extensive cleaning of exam room while observing appropriate contact time as indicated for disinfecting solutions.

## 2019-09-24 NOTE — Telephone Encounter (Signed)
Pt's husband has a question regarding a myChart message about a Lithium lab from Dr. Jonni Sanger.

## 2019-09-24 NOTE — Patient Instructions (Signed)
Please return in 6 months for your annual complete physical; please come fasting and chronic problem f/u.   If you have any questions or concerns, please don't hesitate to send me a message via MyChart or call the office at [phone]. Thank you for visiting with [place] today! It's our pleasure caring for you.

## 2019-09-24 NOTE — Telephone Encounter (Signed)
Mychart message was sent to the patient's husband.

## 2019-09-28 LAB — LITHIUM LEVEL: Lithium Lvl: 0.3 mmol/L — ABNORMAL LOW (ref 0.6–1.2)

## 2019-09-29 ENCOUNTER — Ambulatory Visit (INDEPENDENT_AMBULATORY_CARE_PROVIDER_SITE_OTHER): Payer: Medicare Other | Admitting: Psychology

## 2019-09-29 DIAGNOSIS — F341 Dysthymic disorder: Secondary | ICD-10-CM | POA: Diagnosis not present

## 2019-10-04 ENCOUNTER — Encounter: Payer: Self-pay | Admitting: Family

## 2019-10-04 ENCOUNTER — Telehealth (INDEPENDENT_AMBULATORY_CARE_PROVIDER_SITE_OTHER): Payer: Medicare Other | Admitting: Family

## 2019-10-04 ENCOUNTER — Encounter: Payer: Self-pay | Admitting: Adult Health

## 2019-10-04 ENCOUNTER — Ambulatory Visit (INDEPENDENT_AMBULATORY_CARE_PROVIDER_SITE_OTHER): Payer: Medicare Other | Admitting: Adult Health

## 2019-10-04 ENCOUNTER — Other Ambulatory Visit: Payer: Self-pay

## 2019-10-04 VITALS — Temp 100.1°F

## 2019-10-04 DIAGNOSIS — F4310 Post-traumatic stress disorder, unspecified: Secondary | ICD-10-CM

## 2019-10-04 DIAGNOSIS — J069 Acute upper respiratory infection, unspecified: Secondary | ICD-10-CM | POA: Diagnosis not present

## 2019-10-04 DIAGNOSIS — F319 Bipolar disorder, unspecified: Secondary | ICD-10-CM | POA: Diagnosis not present

## 2019-10-04 DIAGNOSIS — R059 Cough, unspecified: Secondary | ICD-10-CM

## 2019-10-04 DIAGNOSIS — G4700 Insomnia, unspecified: Secondary | ICD-10-CM

## 2019-10-04 DIAGNOSIS — F331 Major depressive disorder, recurrent, moderate: Secondary | ICD-10-CM

## 2019-10-04 DIAGNOSIS — R05 Cough: Secondary | ICD-10-CM | POA: Diagnosis not present

## 2019-10-04 DIAGNOSIS — F411 Generalized anxiety disorder: Secondary | ICD-10-CM | POA: Diagnosis not present

## 2019-10-04 MED ORDER — AZITHROMYCIN 250 MG PO TABS
250.0000 mg | ORAL_TABLET | Freq: Every day | ORAL | 0 refills | Status: DC
Start: 2019-10-04 — End: 2019-11-16

## 2019-10-04 NOTE — Progress Notes (Signed)
Tiffany [name] [id number] 11-07-1950 69 y.o.  Subjective:   Patient ID:  Tiffany [name] is a 69 y.o. (DOB 1950/03/24) female.  Chief Complaint: No chief complaint on file.   HPI Tiffany [name] presents to the office today for follow-up of PTSD, insomnia, GAD, BPD 1.  Describes mood today as "ok". Pleasant. Mood symptoms - reports depression, anxiety, and irritability - "off and on". Stating "I don't want to make any changes today". Reports "some mood instability". Denies having thoughts of harming herself. Seeing therapist regularly. Varying interest and motivation. Taking medications as prescribed.  Energy levels mostly stable. Active, unable to exercise with current physical disabilities. Enjoys some usual interests and activities. Married. Lives with husband of 56 years and their daughter. Talking to family and friends.  Appetite increased. Weight fluctuates - 205 pounds. Sleeping difficulties. verages 8 to 10 hours hours - up and down during the night. Napping during the day some days.  Focus and concentration "varies". Completing tasks. Managing some aspects of household.   Denies SI or HI. Denies AH or VH.   Previous medications: Celexa, Zyprexa, Tegretol, Depakote, Serzone, Topamax, Seroquel, Effexor, Lexapro, Desipramine, Neurontin, Abilify, Geodon, Propanolol, Cymbalta, Cogentin, Trihexyphenadyl, Simet, Provigil, Selegiline, Requip, Amantadine, Prozac, Mirapex, Azilect, Metoclopramide, Baclofen, Artane, Namenda, Latuda.   GAD-7     Office Visit from 05/14/2019 in [place]  Total GAD-7 Score 21    Mini-Mental     Office Visit from 06/07/2019 in [place]  Total Score (max 30 points ) 29    PHQ2-9     Office Visit from 09/16/2019 in [place] Visit from 08/24/2019 in [place] Visit from 05/14/2019 in [place]  PHQ-2 Total Score 0 0 1  PHQ-9  Total Score -- 4 8       Review of Systems:  Review of Systems  Musculoskeletal: Negative for gait problem.  Neurological: Negative for tremors.  Psychiatric/Behavioral:       Please refer to HPI    Medications: I have reviewed the patient's current medications.  Current Outpatient Medications  Medication Sig Dispense Refill  . acetaminophen-codeine (TYLENOL #3) 300-30 MG tablet Take 1 tablet by mouth every 6 (six) hours as needed for severe pain. 15 tablet 0  . amLODipine (NORVASC) 2.5 MG tablet Take 1 tablet (2.5 mg total) by mouth daily. 30 tablet 1  . calcitRIOL (ROCALTROL) 0.25 MCG capsule Take 1 capsule (0.25 mcg total) by mouth every Monday, Wednesday, and Friday. 12 capsule 0  . Certolizumab Pegol (CIMZIA) 2 X 200 MG KIT     . hydrOXYzine (ATARAX/VISTARIL) 10 MG tablet Take 1 tablet (10 mg total) by mouth 3 (three) times daily as needed for anxiety. 60 tablet 1  . lamoTRIgine (LAMICTAL) 200 MG tablet Take 1 tablet (200 mg total) by mouth at bedtime. 30 tablet 1  . levETIRAcetam (KEPPRA) 250 MG tablet Take 1 tablet (250 mg total) by mouth 2 (two) times daily. 60 tablet 1  . levothyroxine (SYNTHROID) 112 MCG tablet Take 1 tablet (112 mcg total) by mouth daily at 6 (six) AM. 30 tablet 1  . lithium carbonate 150 MG capsule Take 1 capsule (150 mg total) by mouth at bedtime. 30 capsule 1  . LORazepam (ATIVAN) 1 MG tablet Take 1 tablet (1 mg total) by mouth at bedtime as needed for sleep. 30 tablet 1  . sodium bicarbonate 650 MG tablet Take 1 tablet (650 mg total) by mouth  2 (two) times daily. 60 tablet 0   No current facility-administered medications for this visit.    Medication Side Effects: None  Allergies:  Allergies  Allergen Reactions  . Aripiprazole Other (See Comments)    Parkinsonism     . Lactose Intolerance (Gi) Diarrhea  . Methotrexate Other (See Comments)    Hair loss, severe stomatitis    . Cefdinir Diarrhea    Other reaction(s): Diarrhea Yeast infection  and fever; negative c diff  . Etanercept Other (See Comments)    Headaches    . Exemestane Other (See Comments)    Suicidal thoughts with medication    . Fluoxetine Other (See Comments)    Parkinsonism  . Methylprednisolone Sodium Succ Other (See Comments)    Agitated mania  . Epinephrine Palpitations    tachycardia   . Nitrofurantoin Nausea And Vomiting and Rash         Past Medical History:  Diagnosis Date  . Bipolar 1 disorder ([place])   . Breast cancer ([place])   . Chronic diarrhea    loose stools twice a day on average for years  . Chronic kidney disease (CKD), stage III (moderate)   . Depression 1987  . Malignant neoplasm of overlapping sites of left breast in female, estrogen receptor positive ([place]) 03/14/2016   Dx in 09/2014, s/p bilateral mastectomies and ALND, 0/10 LN. 1.4 cm  Grade I invasive lobular, ER and PR +/Her--, Ki 67 <5% Tried anastrozole for one month, but developed suicidal idea  . Osteoarthritis, knee 09/24/2019   Xray 09/2019  . Parkinson's disease ([place]) 2012  . Psoriatic arthritis ([place])   . PTSD (post-traumatic stress disorder)   . Secondary hyperparathyroidism ([place])   . Tardive dyskinesia     Family History  Problem Relation Age of Onset  . Cancer Mother   . Cancer Sister   . Stroke Maternal Grandfather   . Diabetes Paternal Grandfather   . Cancer Sister     Social History   Socioeconomic History  . Marital status: Married    Spouse name: Not on file  . Number of children: Not on file  . Years of education: Not on file  . Highest education level: Not on file  Occupational History  . Not on file  Tobacco Use  . Smoking status: Never Smoker  . Smokeless tobacco: Never Used  Vaping Use  . Vaping Use: Never used  Substance and Sexual Activity  . Alcohol use: Never  . Drug use: Never  . Sexual activity: Not Currently  Other Topics Concern  . Not on file  Social History Narrative   Moved to area from [place] 08/2018   Lives one story  home   Right handed.   Social Determinants of Health   Financial Resource Strain:   . Difficulty of Paying Living Expenses:   Food Insecurity:   . Worried About Charity fundraiser in the Last Year:   . Arboriculturist in the Last Year:   Transportation Needs:   . Film/video editor (Medical):   Marland Kitchen Lack of Transportation (Non-Medical):   Physical Activity:   . Days of Exercise per Week:   . Minutes of Exercise per Session:   Stress:   . Feeling of Stress :   Social Connections:   . Frequency of Communication with Friends and Family:   . Frequency of Social Gatherings with Friends and Family:   . Attends Religious Services:   . Active Member of Clubs or  Organizations:   . Attends Archivist Meetings:   Marland Kitchen Marital Status:   Intimate Partner Violence:   . Fear of Current or Ex-Partner:   . Emotionally Abused:   Marland Kitchen Physically Abused:   . Sexually Abused:     Past Medical History, Surgical history, Social history, and Family history were reviewed and updated as appropriate.   Please see review of systems for further details on the patient's review from today.   Objective:   Physical Exam:  There were no vitals taken for this visit.  Physical Exam Constitutional:      General: She is not in acute distress. Musculoskeletal:        General: No deformity.  Neurological:     Mental Status: She is alert and oriented to person, place, and time.     Coordination: Coordination normal.  Psychiatric:        Attention and Perception: Attention and perception normal. She does not perceive auditory or visual hallucinations.        Mood and Affect: Mood normal. Mood is not anxious or depressed. Affect is not labile, blunt, angry or inappropriate.        Speech: Speech normal.        Behavior: Behavior normal.        Thought Content: Thought content normal. Thought content is not paranoid or delusional. Thought content does not include homicidal or suicidal ideation.  Thought content does not include homicidal or suicidal plan.        Cognition and Memory: Cognition and memory normal.        Judgment: Judgment normal.     Comments: Insight intact     Lab Review:     Component Value Date/Time   NA 139 09/13/2019 1243   NA 138 06/08/2019 0000   K 4.2 09/13/2019 1243   CL 104 09/13/2019 1243   CO2 25 09/13/2019 1243   GLUCOSE 100 (H) 09/13/2019 1243   BUN 34 (H) 09/13/2019 1243   BUN 29 (A) 06/08/2019 0000   CREATININE 2.20 (H) 09/13/2019 1243   CALCIUM 9.2 09/13/2019 1243   PROT 7.9 09/10/2019 0851   ALBUMIN 4.0 09/10/2019 0851   AST 19 09/10/2019 0851   ALT 15 09/10/2019 0851   ALKPHOS 125 09/10/2019 0851   BILITOT 1.3 (H) 09/10/2019 0851   GFRNONAA 22 (L) 09/13/2019 1243   GFRAA 25 (L) 09/13/2019 1243       Component Value Date/Time   WBC 13.0 (H) 09/08/2019 1347   RBC 4.95 09/08/2019 1347   HGB 14.4 09/08/2019 1347   HCT 46.0 09/08/2019 1347   PLT 220 09/08/2019 1347   MCV 92.9 09/08/2019 1347   MCH 29.1 09/08/2019 1347   MCHC 31.3 09/08/2019 1347   RDW 13.1 09/08/2019 1347   LYMPHSABS 2.5 04/27/2019 0544   MONOABS 0.5 04/27/2019 0544   EOSABS 0.2 04/27/2019 0544   BASOSABS 0.0 04/27/2019 0544    Lithium Lvl  Date Value Ref Range Status  09/27/2019 0.3 (L) 0.6 - 1.2 mmol/L Final     No results found for: PHENYTOIN, PHENOBARB, VALPROATE, CBMZ   .res Assessment: Plan:    Plan:  Lorazepam 40m BID for anxiety Ingrezza 458mdaily - has medication Hydroxyzine 10107mID for anxiety Lamictal 200m25m hs Lithium 150mg78mly   Lithium level - lab slip given 0.3 - 09/27/2019  Also taking Keppra 250mg 75mfor seizures  Therapist - [name] - seeing every 2 weeks  RTC 4 weeks  Counseled patient regarding potential benefits, risks, and side effects of Lamictal to include potential risk of Stevens-Johnson syndrome. Advised patient to stop taking Lamictal and contact office immediately if rash develops and to seek  urgent medical attention if rash is severe and/or spreading quickly.  Discussed potential benefits, risk, and side effects of benzodiazepines to include potential risk of tolerance and dependence, as well as possible drowsiness.  Advised patient not to drive if experiencing drowsiness and to take lowest possible effective dose to minimize risk of dependence and tolerance.   Diagnoses and all orders for this visit:  Bipolar I disorder ([place])  Insomnia, unspecified type  Generalized anxiety disorder  PTSD (post-traumatic stress disorder)  Major depressive disorder, recurrent episode, moderate ([place])     Please see After Visit Summary for patient specific instructions.  Future Appointments  Date Time Provider Angie  10/13/2019  1:00 PM Cottle, Lucious Groves, LCSW [facility] None  10/20/2019  1:00 PM Cottle, Bambi G, LCSW [facility] None  10/27/2019  1:00 PM Cottle, Bambi G, LCSW [facility] None  11/03/2019  1:00 PM Cottle, Bambi G, LCSW [facility] None  11/12/2019  1:00 PM Warren Danes, PA-C [facility]  12/14/2019 11:30 AM Cameron Sprang, MD [facility] None  03/28/2020 11:30 AM Leamon Arnt, MD [facility]    No orders of the defined types were placed in this encounter.   -------------------------------

## 2019-10-04 NOTE — Progress Notes (Signed)
Virtual Visit via Video   I connected with patient on 10/04/19 at  2:00 PM EDT by a video enabled telemedicine application and verified that I am speaking with the correct person using two identifiers.  Location patient: Home Location provider: [employer], Office Persons participating in the virtual visit: Patient, Provider, CMA  I discussed the limitations of evaluation and management by telemedicine and the availability of in person appointments. The patient expressed understanding and agreed to proceed.  Subjective:   HPI:   69 year old female has requested a video visit with symptoms for cough, congestion, sore throat x 5 days and worsening.   ROS:   See pertinent positives and negatives per HPI.  Patient Active Problem List   Diagnosis Date Noted  . Osteoarthritis, knee 09/24/2019  . CKD (chronic kidney disease) stage 4, GFR 15-29 ml/min (HCC) 09/24/2019  . Severe recurrent major depression without psychotic features ([place]) 09/09/2019  . Chronic diarrhea 05/05/2019  . Bipolar I disorder, most recent episode depressed ([place])   . Transaminitis   . Essential hypertension   . Seizures ([place])   . Encephalopathy 04/26/2019  . Leukocytosis 04/21/2019  . Thrombocytopenia ([place]) 09/04/2018  . Vitamin D deficiency 09/04/2018  . Psoriatic arthritis ([place]) 09/04/2018  . Tubular adenoma of colon 05/28/2017  . History of colonic polyps 05/28/2017  . 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 Malignant neoplasm of overlapping sites of left breast in female, estrogen receptor positive ([place]) 03/14/2016  . Chronic kidney disease, stage III (moderate) 01/19/2016  . Tardive dyskinesia 10/18/2015  . History of breast cancer left 2016 12/27/2014  . Osteopenia determined by x-ray 10/31/2014  . Rosacea 05/21/2007  . Bipolar affective disorder, mixed ([place]) 08/16/2004  . Acquired hypothyroidism 04/03/1996    Social History   Tobacco Use    . Smoking status: Never Smoker  . Smokeless tobacco: Never Used  Substance Use Topics  . Alcohol use: Never    Current Outpatient Medications:  .  acetaminophen-codeine (TYLENOL #3) 300-30 MG tablet, Take 1 tablet by mouth every 6 (six) hours as needed for severe pain., Disp: 15 tablet, Rfl: 0 .  amLODipine (NORVASC) 2.5 MG tablet, Take 1 tablet (2.5 mg total) by mouth daily., Disp: 30 tablet, Rfl: 1 .  calcitRIOL (ROCALTROL) 0.25 MCG capsule, Take 1 capsule (0.25 mcg total) by mouth every Monday, Wednesday, and Friday., Disp: 12 capsule, Rfl: 0 .  Certolizumab Pegol (CIMZIA) 2 X 200 MG KIT, , Disp: , Rfl:  .  hydrOXYzine (ATARAX/VISTARIL) 10 MG tablet, Take 1 tablet (10 mg total) by mouth 3 (three) times daily as needed for anxiety., Disp: 60 tablet, Rfl: 1 .  lamoTRIgine (LAMICTAL) 200 MG tablet, Take 1 tablet (200 mg total) by mouth at bedtime., Disp: 30 tablet, Rfl: 1 .  levETIRAcetam (KEPPRA) 250 MG tablet, Take 1 tablet (250 mg total) by mouth 2 (two) times daily., Disp: 60 tablet, Rfl: 1 .  levothyroxine (SYNTHROID) 112 MCG tablet, Take 1 tablet (112 mcg total) by mouth daily at 6 (six) AM., Disp: 30 tablet, Rfl: 1 .  lithium carbonate 150 MG capsule, Take 1 capsule (150 mg total) by mouth at bedtime., Disp: 30 capsule, Rfl: 1 .  LORazepam (ATIVAN) 1 MG tablet, Take 1 tablet (1 mg total) by mouth at bedtime as needed for sleep., Disp: 30 tablet, Rfl: 1 .  sodium bicarbonate 650 MG tablet, Take 1 tablet (650 mg total) by mouth 2 (two) times daily., Disp: 60  tablet, Rfl: 0 .  azithromycin (ZITHROMAX Z-PAK) 250 MG tablet, Take 1 tablet (250 mg total) by mouth daily. 2 tabs today, then 1 tab daily x 4 more days, Disp: 6 tablet, Rfl: 0  Allergies  Allergen Reactions  . Aripiprazole Other (See Comments)    Parkinsonism     . Lactose Intolerance (Gi) Diarrhea  . Methotrexate Other (See Comments)    Hair loss, severe stomatitis    . Cefdinir Diarrhea    Other reaction(s):  Diarrhea Yeast infection and fever; negative c diff  . Etanercept Other (See Comments)    Headaches    . Exemestane Other (See Comments)    Suicidal thoughts with medication    . Fluoxetine Other (See Comments)    Parkinsonism  . Methylprednisolone Sodium Succ Other (See Comments)    Agitated mania  . Epinephrine Palpitations    tachycardia   . Nitrofurantoin Nausea And Vomiting and Rash         Objective:   Temp 100.1 F (37.8 C) (Temporal)   Patient is well-developed, well-nourished in no acute distress.  Resting comfortably at home.  Head is normocephalic, atraumatic.  No labored breathing.  Speech is clear and coherent with logical content.  Patient is alert and oriented at baseline.   Assessment and Plan:     Tiffany [name] was seen today for acute visit.  Diagnoses and all orders for this visit:  Cough  Upper respiratory tract infection, unspecified type  Other orders -     azithromycin (ZITHROMAX Z-PAK) 250 MG tablet; Take 1 tablet (250 mg total) by mouth daily. 2 tabs today, then 1 tab daily x 4 more days  Declines Prednisone and cough medication. Call if symptoms worsen or persist.    Kennyth Arnold, FNP 10/04/2019

## 2019-10-08 ENCOUNTER — Ambulatory Visit (HOSPITAL_COMMUNITY)
Admission: EM | Admit: 2019-10-08 | Discharge: 2019-10-08 | Disposition: A | Discharge status: Home / Self Care | Payer: Medicare Other | Attending: Urgent Care | Admitting: Urgent Care

## 2019-10-08 ENCOUNTER — Other Ambulatory Visit: Payer: Self-pay

## 2019-10-08 ENCOUNTER — Ambulatory Visit (INDEPENDENT_AMBULATORY_CARE_PROVIDER_SITE_OTHER): Payer: Medicare Other

## 2019-10-08 ENCOUNTER — Encounter (HOSPITAL_COMMUNITY): Payer: Self-pay

## 2019-10-08 ENCOUNTER — Telehealth: Payer: Medicare Other | Admitting: Physician Assistant

## 2019-10-08 ENCOUNTER — Telehealth: Payer: Self-pay | Admitting: Family Medicine

## 2019-10-08 DIAGNOSIS — F319 Bipolar disorder, unspecified: Secondary | ICD-10-CM | POA: Diagnosis not present

## 2019-10-08 DIAGNOSIS — J22 Unspecified acute lower respiratory infection: Secondary | ICD-10-CM | POA: Diagnosis not present

## 2019-10-08 DIAGNOSIS — Z79890 Hormone replacement therapy (postmenopausal): Secondary | ICD-10-CM | POA: Diagnosis not present

## 2019-10-08 DIAGNOSIS — Z86010 Personal history of colonic polyps: Secondary | ICD-10-CM | POA: Diagnosis not present

## 2019-10-08 DIAGNOSIS — Z7952 Long term (current) use of systemic steroids: Secondary | ICD-10-CM | POA: Insufficient documentation

## 2019-10-08 DIAGNOSIS — Z79899 Other long term (current) drug therapy: Secondary | ICD-10-CM | POA: Diagnosis not present

## 2019-10-08 DIAGNOSIS — Z9049 Acquired absence of other specified parts of digestive tract: Secondary | ICD-10-CM | POA: Insufficient documentation

## 2019-10-08 DIAGNOSIS — Z8616 Personal history of COVID-19: Secondary | ICD-10-CM | POA: Insufficient documentation

## 2019-10-08 DIAGNOSIS — Z881 Allergy status to other antibiotic agents status: Secondary | ICD-10-CM | POA: Diagnosis not present

## 2019-10-08 DIAGNOSIS — N2581 Secondary hyperparathyroidism of renal origin: Secondary | ICD-10-CM | POA: Insufficient documentation

## 2019-10-08 DIAGNOSIS — Z853 Personal history of malignant neoplasm of breast: Secondary | ICD-10-CM | POA: Insufficient documentation

## 2019-10-08 DIAGNOSIS — E559 Vitamin D deficiency, unspecified: Secondary | ICD-10-CM | POA: Insufficient documentation

## 2019-10-08 DIAGNOSIS — R0602 Shortness of breath: Secondary | ICD-10-CM | POA: Diagnosis not present

## 2019-10-08 DIAGNOSIS — I129 Hypertensive chronic kidney disease with stage 1 through stage 4 chronic kidney disease, or unspecified chronic kidney disease: Secondary | ICD-10-CM | POA: Diagnosis not present

## 2019-10-08 DIAGNOSIS — L4050 Arthropathic psoriasis, unspecified: Secondary | ICD-10-CM | POA: Insufficient documentation

## 2019-10-08 DIAGNOSIS — Z888 Allergy status to other drugs, medicaments and biological substances status: Secondary | ICD-10-CM | POA: Diagnosis not present

## 2019-10-08 DIAGNOSIS — M8580 Other specified disorders of bone density and structure, unspecified site: Secondary | ICD-10-CM | POA: Diagnosis not present

## 2019-10-08 DIAGNOSIS — D696 Thrombocytopenia, unspecified: Secondary | ICD-10-CM | POA: Insufficient documentation

## 2019-10-08 DIAGNOSIS — N184 Chronic kidney disease, stage 4 (severe): Secondary | ICD-10-CM | POA: Insufficient documentation

## 2019-10-08 DIAGNOSIS — G20 Parkinson's disease: Secondary | ICD-10-CM | POA: Diagnosis not present

## 2019-10-08 DIAGNOSIS — K529 Noninfective gastroenteritis and colitis, unspecified: Secondary | ICD-10-CM | POA: Diagnosis not present

## 2019-10-08 DIAGNOSIS — Z9013 Acquired absence of bilateral breasts and nipples: Secondary | ICD-10-CM | POA: Diagnosis not present

## 2019-10-08 DIAGNOSIS — R05 Cough: Secondary | ICD-10-CM

## 2019-10-08 DIAGNOSIS — F4310 Post-traumatic stress disorder, unspecified: Secondary | ICD-10-CM | POA: Insufficient documentation

## 2019-10-08 DIAGNOSIS — E039 Hypothyroidism, unspecified: Secondary | ICD-10-CM | POA: Diagnosis not present

## 2019-10-08 DIAGNOSIS — Z20822 Contact with and (suspected) exposure to covid-19: Secondary | ICD-10-CM | POA: Diagnosis not present

## 2019-10-08 LAB — SARS CORONAVIRUS 2 (TAT 6-24 HRS): SARS Coronavirus 2: NEGATIVE

## 2019-10-08 MED ORDER — AMOXICILLIN-POT CLAVULANATE 875-125 MG PO TABS
1.0000 | ORAL_TABLET | Freq: Two times a day (BID) | ORAL | 0 refills | Status: AC
Start: 2019-10-08 — End: 2019-10-15

## 2019-10-08 MED ORDER — ALBUTEROL SULFATE HFA 108 (90 BASE) MCG/ACT IN AERS: 1.0000 | INHALATION_SPRAY | Freq: Four times a day (QID) | RESPIRATORY_TRACT | 0 refills | Status: DC | PRN

## 2019-10-08 MED ORDER — PREDNISONE 20 MG PO TABS: 40.0000 mg | ORAL_TABLET | Freq: Every day | ORAL | 0 refills | Status: AC

## 2019-10-08 NOTE — Telephone Encounter (Signed)
Patient is calling in stating she has been sick with a fever of 101.6 (broke last night) ,cough, on/off chest pain,weakness,and is having some random SOB when talking -Tiffany [name] states that she wants an appointment in office, offered virtual but declined, did [facility].

## 2019-10-08 NOTE — Discharge Instructions (Signed)
Your chest xray is overall reassuring today which is a good start.  With the longevity of your symptoms and the wheezing you are experiencing I would like to add antibiotics as well as steroids.  Start with an inhaler, if it helps with your wheezing and shortness of breath then you do not need to take the steroids.  Steroids can cause some palpitations and excitability.  Use of inhaler as needed for wheezing or shortness of breath.   Additional antibiotics, take with food and may add a probiotic to help with any loose stools.  If symptoms worsen or do not improve in the next week to return to be seen or to follow up with your PCP.

## 2019-10-08 NOTE — Telephone Encounter (Signed)
[facility]:  Patient called in at 8:26am.  Stating she has a fever of 99.4, chest pain and SOB.  She just completed a Zpak.  She seen provider last weekend virtually.  Has had no improvement.    Patient was advised to be seen within 4 hours.    After speaking with nurse at [facility] patient was advised to go to [facility] to be seen in person.

## 2019-10-08 NOTE — Telephone Encounter (Signed)
I will wait to address after I receive triage notes

## 2019-10-08 NOTE — ED Provider Notes (Signed)
[place]    CSN: [id number] Arrival date & time: 10/08/19  0944      History   Chief Complaint Chief Complaint  Patient presents with  . Cough  . Shortness of Breath    HPI Tiffany [name] is a 69 y.o. female.   Tiffany [name] presents with complaints of cough. Noted 1 week ago sore throat, low grade temp around 100.5 . 7/26 video visit with PCP, was started on azithromycin, today is last dose. She feels worse. Now with cough. Wheezing at night. Chest tightness. Shortness of breath with talking. Exertion doesn't worsen shortness of breath . Tylenol as needed has helped some. Originally with nausea. Has had diarrhea for the past two days. No ear pain no facial pressure. Doesn't smoke. Husband has had cough also but he has improved. Their daughter now also with cough. Has had covid in the past and has been vaccinated for covid. Was hospitalized due to covid in February of 2021.    ROS per HPI, negative if not otherwise mentioned.      Past Medical History:  Diagnosis Date  . Bipolar 1 disorder ([place])   . Breast cancer ([place])   . Chronic diarrhea    loose stools twice a day on average for years  . Chronic kidney disease (CKD), stage III (moderate)   . Depression 1987  . Malignant neoplasm of overlapping sites of left breast in female, estrogen receptor positive ([place]) 03/14/2016   Dx in 09/2014, s/p bilateral mastectomies and ALND, 0/10 LN. 1.4 cm  Grade I invasive lobular, ER and PR +/Her--, Ki 67 <5% Tried anastrozole for one month, but developed suicidal idea  . Osteoarthritis, knee 09/24/2019   Xray 09/2019  . Parkinson's disease ([place]) 2012  . Psoriatic arthritis ([place])   . PTSD (post-traumatic stress disorder)   . Secondary hyperparathyroidism ([place])   . Tardive dyskinesia     Patient Active Problem List   Diagnosis Date Noted  . Osteoarthritis, knee 09/24/2019  . CKD (chronic kidney disease) stage 4, GFR 15-29 ml/min (HCC) 09/24/2019  . Severe recurrent  major depression without psychotic features ([place]) 09/09/2019  . Chronic diarrhea 05/05/2019  . Bipolar I disorder, most recent episode depressed ([place])   . Transaminitis   . Essential hypertension   . Seizures ([place])   . Encephalopathy 04/26/2019  . Leukocytosis 04/21/2019  . Thrombocytopenia ([place]) 09/04/2018  . Vitamin D deficiency 09/04/2018  . Psoriatic arthritis ([place]) 09/04/2018  . Tubular adenoma of colon 05/28/2017  . History of colonic polyps 05/28/2017  . 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 Malignant neoplasm of overlapping sites of left breast in female, estrogen receptor positive ([place]) 03/14/2016  . Chronic kidney disease, stage III (moderate) 01/19/2016  . Tardive dyskinesia 10/18/2015  . History of breast cancer left 2016 12/27/2014  . Osteopenia determined by x-ray 10/31/2014  . Rosacea 05/21/2007  . Bipolar affective disorder, mixed ([place]) 08/16/2004  . Acquired hypothyroidism 04/03/1996    Past Surgical History:  Procedure Laterality Date  . ABDOMINAL HYSTERECTOMY  1987  . CHOLECYSTECTOMY  1979  . DILATION AND CURETTAGE OF UTERUS  1973  . MASTECTOMY Bilateral 09/19/2014  . TONSILLECTOMY  1970  . URETERAL REIMPLANTION Bilateral 1974    OB History   No obstetric history on file.      Home Medications    Prior to Admission medications   Medication Sig Start Date End Date Taking? Authorizing Provider  acetaminophen-codeine (TYLENOL #3) 300-30  MG tablet Take 1 tablet by mouth every 6 (six) hours as needed for severe pain. 09/16/19  Yes Orma Flaming, MD  amLODipine (NORVASC) 2.5 MG tablet Take 1 tablet (2.5 mg total) by mouth daily. 09/14/19  Yes [name], [name], MD  azithromycin (ZITHROMAX Z-PAK) 250 MG tablet Take 1 tablet (250 mg total) by mouth daily. 2 tabs today, then 1 tab daily x 4 more days 10/04/19  Yes Dutch Quint B, FNP  calcitRIOL (ROCALTROL) 0.25 MCG capsule Take 1 capsule (0.25 mcg total) by mouth every  Monday, Wednesday, and Friday. 09/15/19  Yes [name], [name], MD  Certolizumab Pegol [facility]) 2 X 200 MG KIT  02/27/16  Yes [provider]  hydrOXYzine (ATARAX/VISTARIL) 10 MG tablet Take 1 tablet (10 mg total) by mouth 3 (three) times daily as needed for anxiety. 09/14/19  Yes [name], [name], MD  lamoTRIgine (LAMICTAL) 200 MG tablet Take 1 tablet (200 mg total) by mouth at bedtime. 09/14/19  Yes [name], [name], MD  levETIRAcetam (KEPPRA) 250 MG tablet Take 1 tablet (250 mg total) by mouth 2 (two) times daily. 09/14/19  Yes [name], [name], MD  levothyroxine (SYNTHROID) 112 MCG tablet Take 1 tablet (112 mcg total) by mouth daily at 6 (six) AM. 09/14/19  Yes [name], [name], MD  lithium carbonate 150 MG capsule Take 1 capsule (150 mg total) by mouth at bedtime. 09/14/19  Yes [name], [name], MD  LORazepam (ATIVAN) 1 MG tablet Take 1 tablet (1 mg total) by mouth at bedtime as needed for sleep. 09/14/19  Yes [name], [name], MD  sodium bicarbonate 650 MG tablet Take 1 tablet (650 mg total) by mouth 2 (two) times daily. 09/14/19  Yes [name], [name], MD  albuterol (PROAIR HFA) 108 (90 Base) MCG/ACT inhaler Inhale 1-2 puffs into the lungs every 6 (six) hours as needed for wheezing or shortness of breath. 10/08/19   Zigmund Gottron, NP  amoxicillin-clavulanate (AUGMENTIN) 875-125 MG tablet Take 1 tablet by mouth 2 (two) times daily for 7 days. 10/08/19 10/15/19  Zigmund Gottron, NP  predniSONE (DELTASONE) 20 MG tablet Take 2 tablets (40 mg total) by mouth daily with breakfast for 5 days. 10/08/19 10/13/19  Zigmund Gottron, NP    Family History Family History  Problem Relation Age of Onset  . Cancer Mother   . Cancer Sister   . Stroke Maternal Grandfather   . Diabetes Paternal Grandfather   . Cancer Sister     Social History Social History   Tobacco Use  . Smoking status: Never Smoker  . Smokeless tobacco: Never Used  Vaping Use  . Vaping Use: Never used  Substance Use Topics  . Alcohol use: Never    . Drug use: Never     Allergies   Aripiprazole, Lactose intolerance (gi), Methotrexate, Cefdinir, Etanercept, Exemestane, Fluoxetine, Methylprednisolone sodium succ, Epinephrine, and Nitrofurantoin   Review of Systems Review of Systems   Physical Exam Triage Vital Signs ED Triage Vitals  Enc Vitals Group     BP 10/08/19 1132 (!) 134/76     Pulse Rate 10/08/19 1132 76     Resp 10/08/19 1132 20     Temp 10/08/19 1132 98.1 F (36.7 C)     Temp Source 10/08/19 1132 Oral     SpO2 10/08/19 1126 98 %     Weight --      Height --      Head Circumference --      Peak Flow --  Pain Score 10/08/19 1126 0     Pain Loc --      Pain Edu? --      Excl. in [place]? --    No data found.  Updated Vital Signs BP (!) 134/76 (BP Location: Right Arm)   Pulse 76   Temp 98.1 F (36.7 C) (Oral)   Resp 20   SpO2 98%   Visual Acuity Right Eye Distance:   Left Eye Distance:   Bilateral Distance:    Right Eye Near:   Left Eye Near:    Bilateral Near:     Physical Exam Constitutional:      General: She is not in acute distress.    Appearance: She is well-developed.  Cardiovascular:     Rate and Rhythm: Normal rate.  Pulmonary:     Effort: Pulmonary effort is normal.     Breath sounds: Wheezing and rhonchi present.  Skin:    General: Skin is warm and dry.  Neurological:     Mental Status: She is alert and oriented to person, place, and time.      UC Treatments / Results  Labs (all labs ordered are listed, but only abnormal results are displayed) Labs Reviewed  SARS CORONAVIRUS 2 (TAT 6-24 HRS)    EKG   Radiology DG Chest 2 View  Result Date: 10/08/2019 CLINICAL DATA:  Cough, shortness of breath. EXAM: CHEST - 2 VIEW COMPARISON:  April 21, 2019. FINDINGS: The heart size and mediastinal contours are within normal limits. Both lungs are clear. No pneumothorax pleural or effusion is noted. The visualized skeletal structures are unremarkable. IMPRESSION: No active  cardiopulmonary disease. Electronically Signed   By: Marijo Conception M.D.   On: 10/08/2019 12:52    Procedures Procedures (including critical care time)  Medications Ordered in UC Medications - No data to display  Initial Impression / Assessment and Plan / UC Course  I have reviewed the triage vital signs and the nursing notes.  Pertinent labs & imaging results that were available during my care of the patient were reviewed by me and considered in my medical decision making (see chart for details).     Chest xray without acute findings but with clinical findings on ausculation and with worsening of shortness of breath  Opted to ad augmentin to regimen. Inhaler provided. Discussed use of prednisone as well, patient with mania history so discussed that if inhaler adequate to avoid prednisone, and use only if needed for shortest amount of time necessary to improve symptoms. Return precautions provided. Patient and husband verbalized understanding and agreeable to plan.    Final Clinical Impressions(s) / UC Diagnoses   Final diagnoses:  Lower respiratory tract infection     Discharge Instructions     Your chest xray is overall reassuring today which is a good start.  With the longevity of your symptoms and the wheezing you are experiencing I would like to add antibiotics as well as steroids.  Start with an inhaler, if it helps with your wheezing and shortness of breath then you do not need to take the steroids.  Steroids can cause some palpitations and excitability.  Use of inhaler as needed for wheezing or shortness of breath.   Additional antibiotics, take with food and may add a probiotic to help with any loose stools.  If symptoms worsen or do not improve in the next week to return to be seen or to follow up with your PCP.      ED  Prescriptions    Medication Sig Dispense Auth. Provider   albuterol (PROAIR HFA) 108 (90 Base) MCG/ACT inhaler Inhale 1-2 puffs into the lungs every  6 (six) hours as needed for wheezing or shortness of breath. 8 g Augusto Gamble B, NP   predniSONE (DELTASONE) 20 MG tablet Take 2 tablets (40 mg total) by mouth daily with breakfast for 5 days. 10 tablet Augusto Gamble B, NP   amoxicillin-clavulanate (AUGMENTIN) 875-125 MG tablet Take 1 tablet by mouth 2 (two) times daily for 7 days. 14 tablet Zigmund Gottron, NP     PDMP not reviewed this encounter.   Zigmund Gottron, NP 10/09/19 2045

## 2019-10-08 NOTE — ED Triage Notes (Signed)
Pt c/o cough, fever, congestion for approx 11 days and was evaluated and tx with Z-pack for same. Pt states she began feeling SOB two days ago. Lung sounds with mild wheezes noted. Last dose tylenol was two days ago.   Pt states she was hospitalized for neuro sx 2/2 COVID in January.

## 2019-10-11 ENCOUNTER — Telehealth: Payer: Self-pay | Admitting: Adult Health

## 2019-10-11 NOTE — Telephone Encounter (Signed)
Called and spoke with patient. Will continue current medication regimen.

## 2019-10-11 NOTE — Telephone Encounter (Signed)
PT needs a change from Lithium. Next appt is 8/9 and she is having unstable mood swings. [facility] at [address].

## 2019-10-11 NOTE — Telephone Encounter (Signed)
Will call and speak w patient.

## 2019-10-11 NOTE — Telephone Encounter (Signed)
Noted thank you

## 2019-10-13 ENCOUNTER — Ambulatory Visit (INDEPENDENT_AMBULATORY_CARE_PROVIDER_SITE_OTHER): Payer: Medicare Other | Admitting: Psychology

## 2019-10-13 DIAGNOSIS — F341 Dysthymic disorder: Secondary | ICD-10-CM

## 2019-10-18 ENCOUNTER — Encounter: Payer: Self-pay | Admitting: Adult Health

## 2019-10-18 ENCOUNTER — Other Ambulatory Visit: Payer: Self-pay

## 2019-10-18 ENCOUNTER — Telehealth: Payer: Self-pay | Admitting: Family Medicine

## 2019-10-18 ENCOUNTER — Ambulatory Visit (INDEPENDENT_AMBULATORY_CARE_PROVIDER_SITE_OTHER): Payer: Medicare Other | Admitting: Adult Health

## 2019-10-18 DIAGNOSIS — F4310 Post-traumatic stress disorder, unspecified: Secondary | ICD-10-CM

## 2019-10-18 DIAGNOSIS — F411 Generalized anxiety disorder: Secondary | ICD-10-CM

## 2019-10-18 DIAGNOSIS — G4700 Insomnia, unspecified: Secondary | ICD-10-CM

## 2019-10-18 DIAGNOSIS — F319 Bipolar disorder, unspecified: Secondary | ICD-10-CM | POA: Diagnosis not present

## 2019-10-18 DIAGNOSIS — F331 Major depressive disorder, recurrent, moderate: Secondary | ICD-10-CM

## 2019-10-18 MED ORDER — FLUCONAZOLE 150 MG PO TABS
150.0000 mg | ORAL_TABLET | Freq: Every day | ORAL | 0 refills | Status: DC
Start: 2019-10-18 — End: 2019-11-16

## 2019-10-18 NOTE — Telephone Encounter (Signed)
Patient called the after-hours service asking for Diflucan.  On chart review, she was recently rx antibiotics. Reportedly is having severe symptoms. After I reviewed the allergies I decided to send a prescription for Diflucan. No interactions with other  medications were flagged.

## 2019-10-18 NOTE — Telephone Encounter (Signed)
Pt is checking on status of Diflucan

## 2019-10-18 NOTE — Telephone Encounter (Signed)
Pt has been on antibiotics and is now experiencing symptoms of a yeast infection. Pt asked if medication could be sent in for her. Please advise.

## 2019-10-18 NOTE — Progress Notes (Signed)
Tiffany [name] Fix [id number] April 06, 1950 69 y.o.  Subjective:   Patient ID:  Tiffany [name] is a 69 y.o. (DOB [birth year]) female.  Chief Complaint: No chief complaint on file.   HPI Tiffany [name] presents to the office today for follow-up of PTSD, insomnia, GAD, BPD 1.  Describes mood today as "ok". Pleasant. Mood symptoms - reports decreased depression, anxiety, and irritability - "having better days". Getting over an infection - 2 antibiotics - inhaler - has steroids, but has not taken. Stating "I can tell I feel better today". Seeing therapist - Bambi. Varying interest and motivation. Taking medications as prescribed.  Energy levels improving. Active, unable to exercise with current physical disabilities. Walking more - no longer using walker. Balance is much better.  Enjoys some usual interests and activities. Married. Lives with husband of 11 years and their daughter. Talking to family and friends.  Appetite increased. Weight fluctuates - 205 pounds. Sleeping difficulties. Averages 9 hours hours. Focus and concentration "better some days than others". Completing tasks. Managing some aspects of household.   Denies SI or HI. Denies AH or VH.   Previous medications: Celexa, Zyprexa, Tegretol, Depakote, Serzone, Topamax, Seroquel, Effexor, Lexapro, Desipramine, Neurontin, Abilify, Geodon, Propanolol, Cymbalta, Cogentin, Trihexyphenadyl, Simet, Provigil, Selegiline, Requip, Amantadine, Prozac, Mirapex, Azilect, Metoclopramide, Baclofen, Artane, Namenda, Latuda.    GAD-7     Office Visit from 05/14/2019 in [facility]  Total GAD-7 Score 21    Mini-Mental     Office Visit from 06/07/2019 in [facility]  Total Score (max 30 points ) 29    PHQ2-9     Office Visit from 09/16/2019 in [facility] Visit from 08/24/2019 in [place] Visit from 05/14/2019 in [facility]   PHQ-2 Total Score 0 0 1  PHQ-9 Total Score -- 4 8       Review of Systems:  Review of Systems  Musculoskeletal: Negative for gait problem.  Neurological: Negative for tremors.  Psychiatric/Behavioral:       Please refer to HPI    Medications: I have reviewed the patient's current medications.  Current Outpatient Medications  Medication Sig Dispense Refill  . acetaminophen-codeine (TYLENOL #3) 300-30 MG tablet Take 1 tablet by mouth every 6 (six) hours as needed for severe pain. 15 tablet 0  . albuterol (PROAIR HFA) 108 (90 Base) MCG/ACT inhaler Inhale 1-2 puffs into the lungs every 6 (six) hours as needed for wheezing or shortness of breath. 8 g 0  . amLODipine (NORVASC) 2.5 MG tablet Take 1 tablet (2.5 mg total) by mouth daily. 30 tablet 1  . azithromycin (ZITHROMAX Z-PAK) 250 MG tablet Take 1 tablet (250 mg total) by mouth daily. 2 tabs today, then 1 tab daily x 4 more days 6 tablet 0  . calcitRIOL (ROCALTROL) 0.25 MCG capsule Take 1 capsule (0.25 mcg total) by mouth every Monday, Wednesday, and Friday. 12 capsule 0  . Certolizumab Pegol (CIMZIA) 2 X 200 MG KIT     . hydrOXYzine (ATARAX/VISTARIL) 10 MG tablet Take 1 tablet (10 mg total) by mouth 3 (three) times daily as needed for anxiety. 60 tablet 1  . lamoTRIgine (LAMICTAL) 200 MG tablet Take 1 tablet (200 mg total) by mouth at bedtime. 30 tablet 1  . levETIRAcetam (KEPPRA) 250 MG tablet Take 1 tablet (250 mg total) by mouth 2 (two) times daily. 60 tablet 1  . levothyroxine (SYNTHROID) 112 MCG tablet Take 1 tablet (112 mcg total)  by mouth daily at 6 (six) AM. 30 tablet 1  . lithium carbonate 150 MG capsule Take 1 capsule (150 mg total) by mouth at bedtime. 30 capsule 1  . LORazepam (ATIVAN) 1 MG tablet Take 1 tablet (1 mg total) by mouth at bedtime as needed for sleep. 30 tablet 1  . sodium bicarbonate 650 MG tablet Take 1 tablet (650 mg total) by mouth 2 (two) times daily. 60 tablet 0   No current facility-administered  medications for this visit.    Medication Side Effects: None  Allergies:  Allergies  Allergen Reactions  . Aripiprazole Other (See Comments)    Parkinsonism     . Lactose Intolerance (Gi) Diarrhea  . Methotrexate Other (See Comments)    Hair loss, severe stomatitis    . Cefdinir Diarrhea    Other reaction(s): Diarrhea Yeast infection and fever; negative c diff  . Etanercept Other (See Comments)    Headaches    . Exemestane Other (See Comments)    Suicidal thoughts with medication    . Fluoxetine Other (See Comments)    Parkinsonism  . Methylprednisolone Sodium Succ Other (See Comments)    Agitated mania  . Epinephrine Palpitations    tachycardia   . Nitrofurantoin Nausea And Vomiting and Rash         Past Medical History:  Diagnosis Date  . Bipolar 1 disorder ([place])   . Breast cancer ([place])   . Chronic diarrhea    loose stools twice a day on average for years  . Chronic kidney disease (CKD), stage III (moderate)   . Depression 1987  . 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 1.4 cm  Grade I invasive lobular, ER and PR +/Her--, Ki 67 <5% Tried anastrozole for one month, but developed suicidal idea  . Osteoarthritis, knee 09/24/2019   Xray 09/2019  . Parkinson's disease ([place]) 2012  . Psoriatic arthritis ([place])   . PTSD (post-traumatic stress disorder)   . Secondary hyperparathyroidism ([place])   . Tardive dyskinesia     Family History  Problem Relation Age of Onset  . Cancer Mother   . Cancer Sister   . Stroke Maternal Grandfather   . Diabetes Paternal Grandfather   . Cancer Sister     Social History   Socioeconomic History  . Marital status: Married    Spouse name: Not on file  . Number of children: Not on file  . Years of education: Not on file  . Highest education level: Not on file  Occupational History  . Not on file   Tobacco Use  . Smoking status: Never Smoker  . Smokeless tobacco: Never Used  Vaping Use  . Vaping Use: Never used  Substance and Sexual Activity  . Alcohol use: Never  . Drug use: Never  . Sexual activity: Not Currently  Other Topics Concern  . Not on file  Social History Narrative   Moved to area from [place] 08/2018   Lives one story home   Right handed.   Social Determinants of Health   Financial Resource Strain:   . Difficulty of Paying Living Expenses:   Food Insecurity:   . Worried About Charity fundraiser in the Last Year:   . Arboriculturist in the Last Year:   Transportation Needs:   . Film/video editor (Medical):   Marland Kitchen Lack of Transportation (Non-Medical):  Physical Activity:   . Days of Exercise per Week:   . Minutes of Exercise per Session:   Stress:   . Feeling of Stress :   Social Connections:   . Frequency of Communication with Friends and Family:   . Frequency of Social Gatherings with Friends and Family:   . Attends Religious Services:   . Active Member of Clubs or Organizations:   . Attends Archivist Meetings:   Marland Kitchen Marital Status:   Intimate Partner Violence:   . Fear of Current or Ex-Partner:   . Emotionally Abused:   Marland Kitchen Physically Abused:   . Sexually Abused:     Past Medical History, Surgical history, Social history, and Family history were reviewed and updated as appropriate.   Please see review of systems for further details on the patient's review from today.   Objective:   Physical Exam:  There were no vitals taken for this visit.  Physical Exam Constitutional:      General: She is not in acute distress. Musculoskeletal:        General: No deformity.  Neurological:     Mental Status: She is alert and oriented to person, place, and time.     Coordination: Coordination normal.  Psychiatric:        Attention and Perception: Attention and perception normal. She does not perceive auditory or visual hallucinations.         Mood and Affect: Mood normal. Mood is not anxious or depressed. Affect is not labile, blunt, angry or inappropriate.        Speech: Speech normal.        Behavior: Behavior normal.        Thought Content: Thought content normal. Thought content is not paranoid or delusional. Thought content does not include homicidal or suicidal ideation. Thought content does not include homicidal or suicidal plan.        Cognition and Memory: Cognition and memory normal.        Judgment: Judgment normal.     Comments: Insight intact     Lab Review:     Component Value Date/Time   NA 139 09/13/2019 1243   NA 138 06/08/2019 0000   K 4.2 09/13/2019 1243   CL 104 09/13/2019 1243   CO2 25 09/13/2019 1243   GLUCOSE 100 (H) 09/13/2019 1243   BUN 34 (H) 09/13/2019 1243   BUN 29 (A) 06/08/2019 0000   CREATININE 2.20 (H) 09/13/2019 1243   CALCIUM 9.2 09/13/2019 1243   PROT 7.9 09/10/2019 0851   ALBUMIN 4.0 09/10/2019 0851   AST 19 09/10/2019 0851   ALT 15 09/10/2019 0851   ALKPHOS 125 09/10/2019 0851   BILITOT 1.3 (H) 09/10/2019 0851   GFRNONAA 22 (L) 09/13/2019 1243   GFRAA 25 (L) 09/13/2019 1243       Component Value Date/Time   WBC 13.0 (H) 09/08/2019 1347   RBC 4.95 09/08/2019 1347   HGB 14.4 09/08/2019 1347   HCT 46.0 09/08/2019 1347   PLT 220 09/08/2019 1347   MCV 92.9 09/08/2019 1347   MCH 29.1 09/08/2019 1347   MCHC 31.3 09/08/2019 1347   RDW 13.1 09/08/2019 1347   LYMPHSABS 2.5 04/27/2019 0544   MONOABS 0.5 04/27/2019 0544   EOSABS 0.2 04/27/2019 0544   BASOSABS 0.0 04/27/2019 0544    Lithium Lvl  Date Value Ref Range Status  09/27/2019 0.3 (L) 0.6 - 1.2 mmol/L Final     No results found for: PHENYTOIN, PHENOBARB, VALPROATE, CBMZ   .  res Assessment: Plan:    Plan:  Lorazepam 44m BID for anxiety Ingrezza 421mdaily - has medication Hydroxyzine 1069mID for anxiety Lamictal 200m72m hs Lithium 150mg33mly   Lithium level - lab slip given 0.3 - 09/27/2019  Also  taking Keppra 250mg 25mfor seizures  Therapist - [name] - seeing every 2 weeks  RTC 4 weeks  Counseled patient regarding potential benefits, risks, and side effects of Lamictal to include potential risk of Stevens-Johnson syndrome. Advised patient to stop taking Lamictal and contact office immediately if rash develops and to seek urgent medical attention if rash is severe and/or spreading quickly.  Discussed potential benefits, risk, and side effects of benzodiazepines to include potential risk of tolerance and dependence, as well as possible drowsiness.  Advised patient not to drive if experiencing drowsiness and to take lowest possible effective dose to minimize risk of dependence and tolerance.     Diagnoses and all orders for this visit:  Bipolar I disorder (HCC)  [place], unspecified type  Generalized anxiety disorder  PTSD (post-traumatic stress disorder)  Major depressive disorder, recurrent episode, moderate (HCC)  [place] see After Visit Summary for patient specific instructions.  Future Appointments  Date Time Provider DepartManson/2021  1:00 PM Cottle, Bambi G, LCSW [facility] None  10/27/2019  1:00 PM Cottle, Bambi G, LCSW [facility] None  11/03/2019  1:00 PM Cottle, Bambi G, LCSW [facility] None  11/12/2019  1:00 PM SheffiWarren Danes [facility]  12/14/2019 11:30 AM AquinoCameron SprangBN-[facility] None  03/28/2020 11:30 AM Andy, Leamon ArntBPC[facility]    No orders of the defined types were placed in this encounter.   -------------------------------

## 2019-10-18 NOTE — Telephone Encounter (Signed)
OK to send in Diflucan? Please advise

## 2019-10-19 NOTE — Telephone Encounter (Signed)
Thanks

## 2019-10-20 ENCOUNTER — Ambulatory Visit: Payer: Medicare Other | Admitting: Adult Health

## 2019-10-20 ENCOUNTER — Ambulatory Visit (INDEPENDENT_AMBULATORY_CARE_PROVIDER_SITE_OTHER): Payer: Medicare Other | Admitting: Psychology

## 2019-10-20 DIAGNOSIS — F341 Dysthymic disorder: Secondary | ICD-10-CM | POA: Diagnosis not present

## 2019-10-27 ENCOUNTER — Ambulatory Visit: Payer: Medicare Other | Admitting: Psychology

## 2019-10-28 ENCOUNTER — Ambulatory Visit (INDEPENDENT_AMBULATORY_CARE_PROVIDER_SITE_OTHER): Payer: Medicare Other | Admitting: Psychology

## 2019-10-28 DIAGNOSIS — F314 Bipolar disorder, current episode depressed, severe, without psychotic features: Secondary | ICD-10-CM

## 2019-11-03 ENCOUNTER — Ambulatory Visit (INDEPENDENT_AMBULATORY_CARE_PROVIDER_SITE_OTHER): Payer: Medicare Other | Admitting: Psychology

## 2019-11-03 DIAGNOSIS — F314 Bipolar disorder, current episode depressed, severe, without psychotic features: Secondary | ICD-10-CM | POA: Diagnosis not present

## 2019-11-08 ENCOUNTER — Other Ambulatory Visit: Payer: Self-pay

## 2019-11-08 ENCOUNTER — Telehealth: Payer: Self-pay | Admitting: Adult Health

## 2019-11-08 MED ORDER — LITHIUM CARBONATE 150 MG PO CAPS: 150.0000 mg | ORAL_CAPSULE | Freq: Every day | ORAL | 1 refills | Status: DC

## 2019-11-08 NOTE — Telephone Encounter (Signed)
[place] called because she was recently in the hospital and they prescribed Lithium 150mg  1 qHS.  She will run out of this prescription in about 4 days and wants to know if it is something you will want to keep her on.  If so, she will need a refill called into the [facility] in [place].  She has appt 9/7 but will run out the the mediation prior to the appt.  Please let her know what you think.

## 2019-11-08 NOTE — Telephone Encounter (Signed)
Rx sent 

## 2019-11-08 NOTE — Telephone Encounter (Signed)
Please review

## 2019-11-08 NOTE — Telephone Encounter (Signed)
Ok to send script ??

## 2019-11-10 ENCOUNTER — Ambulatory Visit (INDEPENDENT_AMBULATORY_CARE_PROVIDER_SITE_OTHER): Payer: Medicare Other | Admitting: Psychology

## 2019-11-10 DIAGNOSIS — F314 Bipolar disorder, current episode depressed, severe, without psychotic features: Secondary | ICD-10-CM

## 2019-11-12 ENCOUNTER — Ambulatory Visit: Payer: Medicare Other | Admitting: Physician Assistant

## 2019-11-12 ENCOUNTER — Telehealth: Payer: Self-pay | Admitting: Adult Health

## 2019-11-12 ENCOUNTER — Ambulatory Visit (INDEPENDENT_AMBULATORY_CARE_PROVIDER_SITE_OTHER): Payer: Medicare Other | Admitting: Psychology

## 2019-11-12 DIAGNOSIS — F314 Bipolar disorder, current episode depressed, severe, without psychotic features: Secondary | ICD-10-CM

## 2019-11-12 NOTE — Telephone Encounter (Signed)
Pt called and she needs direction on how to take her ativan. She was prescribed 2 mg a day. She is in a hypo manic stage and needs to know how much to take and how often. She has an appt on 9/7

## 2019-11-12 NOTE — Telephone Encounter (Signed)
According to last note 1 mg bid?

## 2019-11-12 NOTE — Telephone Encounter (Signed)
Yes - she make take 3 if needed

## 2019-11-16 ENCOUNTER — Ambulatory Visit (INDEPENDENT_AMBULATORY_CARE_PROVIDER_SITE_OTHER): Payer: Medicare Other | Admitting: Adult Health

## 2019-11-16 ENCOUNTER — Other Ambulatory Visit: Payer: Self-pay

## 2019-11-16 ENCOUNTER — Encounter: Payer: Self-pay | Admitting: Adult Health

## 2019-11-16 ENCOUNTER — Encounter: Payer: Self-pay | Admitting: Family Medicine

## 2019-11-16 DIAGNOSIS — F411 Generalized anxiety disorder: Secondary | ICD-10-CM | POA: Diagnosis not present

## 2019-11-16 DIAGNOSIS — F4310 Post-traumatic stress disorder, unspecified: Secondary | ICD-10-CM

## 2019-11-16 DIAGNOSIS — F319 Bipolar disorder, unspecified: Secondary | ICD-10-CM

## 2019-11-16 DIAGNOSIS — G4700 Insomnia, unspecified: Secondary | ICD-10-CM | POA: Diagnosis not present

## 2019-11-16 DIAGNOSIS — F331 Major depressive disorder, recurrent, moderate: Secondary | ICD-10-CM

## 2019-11-16 NOTE — Progress Notes (Signed)
Tiffany [name] [id number] 08-Dec-1950 69 y.o.  Subjective:   Patient ID:  Tiffany [name] is a 69 y.o. (DOB 1950-08-03) female.  Chief Complaint: No chief complaint on file.   HPI Tiffany [name] presents to the office today for follow-up of PTSD, insomnia, GAD, BPD 1.  Describes mood today as "ok". Pleasant. Mood symptoms - reports depression, anxiety, and irritability. Having periods of "hypomania". Feels "highly reactive". More irritable at husband - threw her phone - getting frustrated over little things. Stating - "my life is a white board and I wish I could get rid of some stuff". Reports 7 to 10 days of "hypomania" - aggravated, irritable, racing thoughts and the need to go - "my motor was idling too much". Talking fast, walking fast, getting things done quickly. Symptoms stopped for one day and now "it's starting back up today". Would like to continue current medications for now. Stating "I need to be more aware". Also stating "I know it's just temporary". Seeing therapist - Bambi. Varying interest and motivation. Taking medications as prescribed.  Energy levels "a little too good I think". Active, unable to exercise with current physical disabilities. Walking more. Enjoys some usual interests and activities. Married. Lives with husband of 22 years and their daughter. Talking to family and friends.  Appetite increased. Weight gain 6 pounds - 211 pounds. Sleeping difficulties. Averages 6 hours hours. Having "pain issues". Focus and concentration "as good as it needs to be". Having to write down thoughts. Completing tasks. Managing some aspects of household.   Denies SI or HI. Denies AH or VH.   Previous medications: Celexa, Zyprexa, Tegretol, Depakote, Serzone, Topamax, Seroquel, Effexor, Lexapro, Desipramine, Neurontin, Abilify, Geodon, Propanolol, Cymbalta, Cogentin, Trihexyphenadyl, Simet, Provigil, Selegiline, Requip, Amantadine, Prozac, Mirapex, Azilect, Metoclopramide, Baclofen,  Artane, Namenda, Latuda.    GAD-7     Office Visit from 05/14/2019 in [facility]  Total GAD-7 Score 21    Mini-Mental     Office Visit from 06/07/2019 in [facility]  Total Score (max 30 points ) 29    PHQ2-9     Office Visit from 09/16/2019 in [facility] Visit from 08/24/2019 in [facility] Visit from 05/14/2019 in [facility]  PHQ-2 Total Score 0 0 1  PHQ-9 Total Score -- 4 8       Review of Systems:  Review of Systems  Musculoskeletal: Negative for gait problem.  Neurological: Negative for tremors.  Psychiatric/Behavioral:       Please refer to HPI    Medications: I have reviewed the patient's current medications.  Current Outpatient Medications  Medication Sig Dispense Refill  . albuterol (PROAIR HFA) 108 (90 Base) MCG/ACT inhaler Inhale 1-2 puffs into the lungs every 6 (six) hours as needed for wheezing or shortness of breath. 8 g 0  . amLODipine (NORVASC) 2.5 MG tablet Take 1 tablet (2.5 mg total) by mouth daily. 30 tablet 1  . calcitRIOL (ROCALTROL) 0.25 MCG capsule Take 1 capsule (0.25 mcg total) by mouth every Monday, Wednesday, and Friday. 12 capsule 0  . Certolizumab Pegol (CIMZIA) 2 X 200 MG KIT     . hydrOXYzine (ATARAX/VISTARIL) 10 MG tablet Take 1 tablet (10 mg total) by mouth 3 (three) times daily as needed for anxiety. 60 tablet 1  . lamoTRIgine (LAMICTAL) 200 MG tablet Take 1 tablet (200 mg total) by mouth at bedtime. 30 tablet 1  . levETIRAcetam (KEPPRA) 250 MG tablet Take  1 tablet (250 mg total) by mouth 2 (two) times daily. 60 tablet 1  . levothyroxine (SYNTHROID) 112 MCG tablet Take 1 tablet (112 mcg total) by mouth daily at 6 (six) AM. 30 tablet 1  . lithium carbonate 150 MG capsule Take 1 capsule (150 mg total) by mouth at bedtime. 30 capsule 1  . LORazepam (ATIVAN) 1 MG tablet Take 1 tablet (1 mg total) by mouth at bedtime as needed  for sleep. 30 tablet 1  . sodium bicarbonate 650 MG tablet Take 1 tablet (650 mg total) by mouth 2 (two) times daily. 60 tablet 0   No current facility-administered medications for this visit.    Medication Side Effects: None  Allergies:  Allergies  Allergen Reactions  . Aripiprazole Other (See Comments)    Parkinsonism     . Lactose Intolerance (Gi) Diarrhea  . Methotrexate Other (See Comments)    Hair loss, severe stomatitis    . Cefdinir Diarrhea    Other reaction(s): Diarrhea Yeast infection and fever; negative c diff  . Etanercept Other (See Comments)    Headaches    . Exemestane Other (See Comments)    Suicidal thoughts with medication    . Fluoxetine Other (See Comments)    Parkinsonism  . Methylprednisolone Sodium Succ Other (See Comments)    Agitated mania  . Epinephrine Palpitations    tachycardia   . Nitrofurantoin Nausea And Vomiting and Rash         Past Medical History:  Diagnosis Date  . Bipolar 1 disorder ([place])   . Breast cancer ([place])   . Chronic diarrhea    loose stools twice a day on average for years  . Chronic kidney disease (CKD), stage III (moderate)   . Depression 1987  . 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 1.4 cm  Grade I invasive lobular, ER and PR +/Her--, Ki 67 <5% Tried anastrozole for one month, but developed suicidal idea  . Osteoarthritis, knee 09/24/2019   Xray 09/2019  . Parkinson's disease ([place]) 2012  . Psoriatic arthritis ([place])   . PTSD (post-traumatic stress disorder)   . Secondary hyperparathyroidism ([place])   . Tardive dyskinesia     Family History  Problem Relation Age of Onset  . Cancer Mother   . Cancer Sister   . Stroke Maternal Grandfather   . Diabetes Paternal Grandfather   . Cancer Sister     Social History   Socioeconomic History  . Marital status: Married    Spouse  name: Not on file  . Number of children: Not on file  . Years of education: Not on file  . Highest education level: Not on file  Occupational History  . Not on file  Tobacco Use  . Smoking status: Never Smoker  . Smokeless tobacco: Never Used  Vaping Use  . Vaping Use: Never used  Substance and Sexual Activity  . Alcohol use: Never  . Drug use: Never  . Sexual activity: Not Currently  Other Topics Concern  . Not on file  Social History Narrative   Moved to area from [place] 08/2018   Lives one story home   Right handed.   Social Determinants of Health   Financial Resource Strain:   . Difficulty of Paying Living Expenses: Not on file  Food Insecurity:   . Worried About Charity fundraiser in the Last Year:  Not on file  . Ran Out of Food in the Last Year: Not on file  Transportation Needs:   . Lack of Transportation (Medical): Not on file  . Lack of Transportation (Non-Medical): Not on file  Physical Activity:   . Days of Exercise per Week: Not on file  . Minutes of Exercise per Session: Not on file  Stress:   . Feeling of Stress : Not on file  Social Connections:   . Frequency of Communication with Friends and Family: Not on file  . Frequency of Social Gatherings with Friends and Family: Not on file  . Attends Religious Services: Not on file  . Active Member of Clubs or Organizations: Not on file  . Attends Archivist Meetings: Not on file  . Marital Status: Not on file  Intimate Partner Violence:   . Fear of Current or Ex-Partner: Not on file  . Emotionally Abused: Not on file  . Physically Abused: Not on file  . Sexually Abused: Not on file    Past Medical History, Surgical history, Social history, and Family history were reviewed and updated as appropriate.   Please see review of systems for further details on the patient's review from today.   Objective:   Physical Exam:  There were no vitals taken for this visit.  Physical  Exam Constitutional:      General: She is not in acute distress. Musculoskeletal:        General: No deformity.  Neurological:     Mental Status: She is alert and oriented to person, place, and time.     Coordination: Coordination normal.  Psychiatric:        Attention and Perception: Attention and perception normal. She does not perceive auditory or visual hallucinations.        Mood and Affect: Mood normal. Mood is not anxious or depressed. Affect is not labile, blunt, angry or inappropriate.        Speech: Speech normal.        Behavior: Behavior normal.        Thought Content: Thought content normal. Thought content is not paranoid or delusional. Thought content does not include homicidal or suicidal ideation. Thought content does not include homicidal or suicidal plan.        Cognition and Memory: Cognition and memory normal.        Judgment: Judgment normal.     Comments: Insight intact     Lab Review:     Component Value Date/Time   NA 139 09/13/2019 1243   NA 138 06/08/2019 0000   K 4.2 09/13/2019 1243   CL 104 09/13/2019 1243   CO2 25 09/13/2019 1243   GLUCOSE 100 (H) 09/13/2019 1243   BUN 34 (H) 09/13/2019 1243   BUN 29 (A) 06/08/2019 0000   CREATININE 2.20 (H) 09/13/2019 1243   CALCIUM 9.2 09/13/2019 1243   PROT 7.9 09/10/2019 0851   ALBUMIN 4.0 09/10/2019 0851   AST 19 09/10/2019 0851   ALT 15 09/10/2019 0851   ALKPHOS 125 09/10/2019 0851   BILITOT 1.3 (H) 09/10/2019 0851   GFRNONAA 22 (L) 09/13/2019 1243   GFRAA 25 (L) 09/13/2019 1243       Component Value Date/Time   WBC 13.0 (H) 09/08/2019 1347   RBC 4.95 09/08/2019 1347   HGB 14.4 09/08/2019 1347   HCT 46.0 09/08/2019 1347   PLT 220 09/08/2019 1347   MCV 92.9 09/08/2019 1347   MCH 29.1 09/08/2019 1347   MCHC  31.3 09/08/2019 1347   RDW 13.1 09/08/2019 1347   LYMPHSABS 2.5 04/27/2019 0544   MONOABS 0.5 04/27/2019 0544   EOSABS 0.2 04/27/2019 0544   BASOSABS 0.0 04/27/2019 0544    Lithium Lvl   Date Value Ref Range Status  09/27/2019 0.3 (L) 0.6 - 1.2 mmol/L Final     No results found for: PHENYTOIN, PHENOBARB, VALPROATE, CBMZ   .res Assessment: Plan:    Plan:  Lorazepam 19m BID for anxiety Ingrezza 429mdaily - has medication Hydroxyzine 1076mID for anxiety Lamictal 200m22m hs Lithium 150mg27mly   Lithium level - lab slip given 0.3 - 09/27/2019  Also taking Keppra 250mg 93mfor seizures  Therapist - [name] - seeing every 2 weeks  RTC 4 weeks  Counseled patient regarding potential benefits, risks, and side effects of Lamictal to include potential risk of Stevens-Johnson syndrome. Advised patient to stop taking Lamictal and contact office immediately if rash develops and to seek urgent medical attention if rash is severe and/or spreading quickly.  Discussed potential benefits, risk, and side effects of benzodiazepines to include potential risk of tolerance and dependence, as well as possible drowsiness.  Advised patient not to drive if experiencing drowsiness and to take lowest possible effective dose to minimize risk of dependence and tolerance.     Diagnoses and all orders for this visit:  Bipolar I disorder (HCC)  [place], unspecified type  PTSD (post-traumatic stress disorder)  Generalized anxiety disorder  Major depressive disorder, recurrent episode, moderate (HCC)  [place]Please see After Visit Summary for patient specific instructions.  Future Appointments  Date Time Provider DepartBurney2021  1:00 PM Cottle, Bambi [place] [facility] None  11/24/2019  1:00 PM Cottle, Bambi G, LCSW [facility] None  12/08/2019  1:00 PM Cottle, Bambi G, LCSW [facility] None  12/14/2019 11:30 AM AquinoCameron SprangBN[facility] None  03/28/2020 11:30 AM Andy, Leamon ArntBPC-[facility]    No orders of the defined types were placed in this encounter.   -------------------------------

## 2019-11-17 ENCOUNTER — Ambulatory Visit (INDEPENDENT_AMBULATORY_CARE_PROVIDER_SITE_OTHER): Payer: Medicare Other | Admitting: Psychology

## 2019-11-17 DIAGNOSIS — F314 Bipolar disorder, current episode depressed, severe, without psychotic features: Secondary | ICD-10-CM | POA: Diagnosis not present

## 2019-11-19 ENCOUNTER — Ambulatory Visit (INDEPENDENT_AMBULATORY_CARE_PROVIDER_SITE_OTHER): Payer: Medicare Other | Admitting: Psychology

## 2019-11-19 DIAGNOSIS — F314 Bipolar disorder, current episode depressed, severe, without psychotic features: Secondary | ICD-10-CM | POA: Diagnosis not present

## 2019-11-24 ENCOUNTER — Ambulatory Visit (INDEPENDENT_AMBULATORY_CARE_PROVIDER_SITE_OTHER): Payer: Medicare Other | Admitting: Psychology

## 2019-11-24 DIAGNOSIS — F314 Bipolar disorder, current episode depressed, severe, without psychotic features: Secondary | ICD-10-CM | POA: Diagnosis not present

## 2019-12-01 ENCOUNTER — Other Ambulatory Visit: Payer: Self-pay

## 2019-12-01 ENCOUNTER — Encounter: Payer: Self-pay | Admitting: Adult Health

## 2019-12-01 ENCOUNTER — Ambulatory Visit (INDEPENDENT_AMBULATORY_CARE_PROVIDER_SITE_OTHER): Payer: Medicare Other | Admitting: Adult Health

## 2019-12-01 DIAGNOSIS — F4310 Post-traumatic stress disorder, unspecified: Secondary | ICD-10-CM

## 2019-12-01 DIAGNOSIS — F331 Major depressive disorder, recurrent, moderate: Secondary | ICD-10-CM | POA: Diagnosis not present

## 2019-12-01 DIAGNOSIS — F319 Bipolar disorder, unspecified: Secondary | ICD-10-CM

## 2019-12-01 DIAGNOSIS — F411 Generalized anxiety disorder: Secondary | ICD-10-CM | POA: Diagnosis not present

## 2019-12-01 DIAGNOSIS — Z79899 Other long term (current) drug therapy: Secondary | ICD-10-CM

## 2019-12-01 DIAGNOSIS — G4700 Insomnia, unspecified: Secondary | ICD-10-CM

## 2019-12-01 MED ORDER — LAMOTRIGINE 200 MG PO TABS: 200.0000 mg | ORAL_TABLET | Freq: Every day | ORAL | 3 refills | Status: DC

## 2019-12-01 MED ORDER — LORAZEPAM 1 MG PO TABS: 1.0000 mg | ORAL_TABLET | Freq: Two times a day (BID) | ORAL | 2 refills | Status: DC

## 2019-12-01 NOTE — Progress Notes (Signed)
Tiffany [name] [id number] 09-May-1950 69 y.o.  Subjective:   Patient ID:  Tiffany [name] is a 69 y.o. (DOB 1950/07/28) female.  Chief Complaint: No chief complaint on file.   HPI   Husband present for interview.  Tiffany [name] presents to the office today for follow-up of PTSD, insomnia, GAD, BPD 1.  Describes mood today as "not the best". Pleasant. Tearful at times. Mood symptoms - denies depression. Irrational thinking at times. Feels anxious and irritable. Ativan continues to help with anxiety. Reports mood instability - "rapid cycling". Stating "my moods are changing daily and throughout the day". Racing thoughts "sometimes". Seeing therapist - Tiffany [name]. Varying interest and motivation. Taking medications as prescribed Energy levels increased. Active, unable to exercise with current physical disabilities. Walking. Enjoys some usual interests and activities. Married. Lives with husband of 76 years and their daughter. Talking to family and friends.  Appetite stable. Weight stable 211 pounds. Sleeping difficulties. Stating "it varies drastically".  Focus and concentration "ok". Completing tasks. Managing some aspects of household.   Denies SI or HI. Denies AH or VH.   Previous medications: Celexa, Zyprexa, Tegretol, Depakote, Serzone, Topamax, Seroquel, Effexor, Lexapro, Desipramine, Neurontin, Abilify, Geodon, Propanolol, Cymbalta, Cogentin, Trihexyphenadyl, Simet, Provigil, Selegiline, Requip, Amantadine, Prozac, Mirapex, Azilect, Metoclopramide, Baclofen, Artane, Namenda, Latuda.   GAD-7     Office Visit from 05/14/2019 in [facility]  Total GAD-7 Score 21    Mini-Mental     Office Visit from 06/07/2019 in [facility]  Total Score (max 30 points ) 29    PHQ2-9     Office Visit from 09/16/2019 in [place] Visit from 08/24/2019 in [facility] Visit from 05/14/2019 in [place] [facility]  PHQ-2 Total Score 0 0 1  PHQ-9 Total Score -- 4 8       Review of Systems:  Review of Systems  Musculoskeletal: Negative for gait problem.  Neurological: Negative for tremors.  Psychiatric/Behavioral:       Please refer to HPI    Medications: I have reviewed the patient's current medications.  Current Outpatient Medications  Medication Sig Dispense Refill  . albuterol (PROAIR HFA) 108 (90 Base) MCG/ACT inhaler Inhale 1-2 puffs into the lungs every 6 (six) hours as needed for wheezing or shortness of breath. 8 g 0  . amLODipine (NORVASC) 2.5 MG tablet Take 1 tablet (2.5 mg total) by mouth daily. 30 tablet 1  . calcitRIOL (ROCALTROL) 0.25 MCG capsule Take 1 capsule (0.25 mcg total) by mouth every Monday, Wednesday, and Friday. 12 capsule 0  . Certolizumab Pegol (CIMZIA) 2 X 200 MG KIT     . hydrOXYzine (ATARAX/VISTARIL) 10 MG tablet Take 1 tablet (10 mg total) by mouth 3 (three) times daily as needed for anxiety. 60 tablet 1  . lamoTRIgine (LAMICTAL) 200 MG tablet Take 1 tablet (200 mg total) by mouth at bedtime. 90 tablet 3  . levETIRAcetam (KEPPRA) 250 MG tablet Take 1 tablet (250 mg total) by mouth 2 (two) times daily. 60 tablet 1  . levothyroxine (SYNTHROID) 112 MCG tablet Take 1 tablet (112 mcg total) by mouth daily at 6 (six) AM. 30 tablet 1  . lithium carbonate 150 MG capsule Take 1 capsule (150 mg total) by mouth at bedtime. 30 capsule 1  . LORazepam (ATIVAN) 1 MG tablet Take 1 tablet (1 mg total) by mouth 2 (two) times daily. 60 tablet 2  . sodium bicarbonate 650 MG tablet  Take 1 tablet (650 mg total) by mouth 2 (two) times daily. 60 tablet 0   No current facility-administered medications for this visit.    Medication Side Effects: None  Allergies:  Allergies  Allergen Reactions  . Aripiprazole Other (See Comments)    Parkinsonism     . Lactose Intolerance (Gi) Diarrhea  . Methotrexate Other (See Comments)    Hair loss, severe  stomatitis    . Cefdinir Diarrhea    Other reaction(s): Diarrhea Yeast infection and fever; negative c diff  . Etanercept Other (See Comments)    Headaches    . Exemestane Other (See Comments)    Suicidal thoughts with medication    . Fluoxetine Other (See Comments)    Parkinsonism  . Methylprednisolone Sodium Succ Other (See Comments)    Agitated mania  . Epinephrine Palpitations    tachycardia   . Nitrofurantoin Nausea And Vomiting and Rash         Past Medical History:  Diagnosis Date  . Bipolar 1 disorder ([place])   . Breast cancer ([place])   . Chronic diarrhea    loose stools twice a day on average for years  . Chronic kidney disease (CKD), stage III (moderate)   . Depression 1987  . 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 1.4 cm  Grade I invasive lobular, ER and PR +/Her--, Ki 67 <5% Tried anastrozole for one month, but developed suicidal idea  . Osteoarthritis, knee 09/24/2019   Xray 09/2019  . Parkinson's disease ([place]) 2012  . Psoriatic arthritis ([place])   . PTSD (post-traumatic stress disorder)   . Secondary hyperparathyroidism ([place])   . Tardive dyskinesia     Family History  Problem Relation Age of Onset  . Cancer Mother   . Cancer Sister   . Stroke Maternal Grandfather   . Diabetes Paternal Grandfather   . Cancer Sister     Social History   Socioeconomic History  . Marital status: Married    Spouse name: Not on file  . Number of children: Not on file  . Years of education: Not on file  . Highest education level: Not on file  Occupational History  . Not on file  Tobacco Use  . Smoking status: Never Smoker  . Smokeless tobacco: Never Used  Vaping Use  . Vaping Use: Never used  Substance and Sexual Activity  . Alcohol use: Never  . Drug use: Never  . Sexual activity: Not Currently  Other Topics Concern  . Not on file   Social History Narrative   Moved to area from [place] 08/2018   Lives one story home   Right handed.   Social Determinants of Health   Financial Resource Strain:   . Difficulty of Paying Living Expenses: Not on file  Food Insecurity:   . Worried About Charity fundraiser in the Last Year: Not on file  . Ran Out of Food in the Last Year: Not on file  Transportation Needs:   . Lack of Transportation (Medical): Not on file  . Lack of Transportation (Non-Medical): Not on file  Physical Activity:   . Days of Exercise per Week: Not on file  . Minutes of Exercise per Session: Not on file  Stress:   . Feeling of Stress : Not on file  Social Connections:   . Frequency of Communication with Friends and Family: Not  on file  . Frequency of Social Gatherings with Friends and Family: Not on file  . Attends Religious Services: Not on file  . Active Member of Clubs or Organizations: Not on file  . Attends Archivist Meetings: Not on file  . Marital Status: Not on file  Intimate Partner Violence:   . Fear of Current or Ex-Partner: Not on file  . Emotionally Abused: Not on file  . Physically Abused: Not on file  . Sexually Abused: Not on file    Past Medical History, Surgical history, Social history, and Family history were reviewed and updated as appropriate.   Please see review of systems for further details on the patient's review from today.   Objective:   Physical Exam:  There were no vitals taken for this visit.  Physical Exam Constitutional:      General: She is not in acute distress. Musculoskeletal:        General: No deformity.  Neurological:     Mental Status: She is alert and oriented to person, place, and time.     Coordination: Coordination normal.  Psychiatric:        Attention and Perception: Attention and perception normal. She does not perceive auditory or visual hallucinations.        Mood and Affect: Mood normal. Mood is not anxious or depressed.  Affect is not labile, blunt, angry or inappropriate.        Speech: Speech normal.        Behavior: Behavior normal.        Thought Content: Thought content normal. Thought content is not paranoid or delusional. Thought content does not include homicidal or suicidal ideation. Thought content does not include homicidal or suicidal plan.        Cognition and Memory: Cognition and memory normal.        Judgment: Judgment normal.     Comments: Insight intact     Lab Review:     Component Value Date/Time   NA 139 09/13/2019 1243   NA 138 06/08/2019 0000   K 4.2 09/13/2019 1243   CL 104 09/13/2019 1243   CO2 25 09/13/2019 1243   GLUCOSE 100 (H) 09/13/2019 1243   BUN 34 (H) 09/13/2019 1243   BUN 29 (A) 06/08/2019 0000   CREATININE 2.20 (H) 09/13/2019 1243   CALCIUM 9.2 09/13/2019 1243   PROT 7.9 09/10/2019 0851   ALBUMIN 4.0 09/10/2019 0851   AST 19 09/10/2019 0851   ALT 15 09/10/2019 0851   ALKPHOS 125 09/10/2019 0851   BILITOT 1.3 (H) 09/10/2019 0851   GFRNONAA 22 (L) 09/13/2019 1243   GFRAA 25 (L) 09/13/2019 1243       Component Value Date/Time   WBC 13.0 (H) 09/08/2019 1347   RBC 4.95 09/08/2019 1347   HGB 14.4 09/08/2019 1347   HCT 46.0 09/08/2019 1347   PLT 220 09/08/2019 1347   MCV 92.9 09/08/2019 1347   MCH 29.1 09/08/2019 1347   MCHC 31.3 09/08/2019 1347   RDW 13.1 09/08/2019 1347   LYMPHSABS 2.5 04/27/2019 0544   MONOABS 0.5 04/27/2019 0544   EOSABS 0.2 04/27/2019 0544   BASOSABS 0.0 04/27/2019 0544    Lithium Lvl  Date Value Ref Range Status  09/27/2019 0.3 (L) 0.6 - 1.2 mmol/L Final     No results found for: PHENYTOIN, PHENOBARB, VALPROATE, CBMZ   .res Assessment: Plan:    Plan: Lorazepam $RemoveBeforeD'1mg'[id number]$  BID for anxiety - make take one tablet extra for severe anxiety  symptoms. Ingrezza 40m daily   Hydroxyzine 14mTID for anxiety Lamictal 20053mt hs Lithium 150m83mily   Labs - lithium level, TSH, CMP   Also taking Keppra 250mg76m for  seizures  Therapist - [name] - seeing every 2 weeks  RTC 4 weeks  Counseled patient regarding potential benefits, risks, and side effects of Lamictal to include potential risk of Stevens-Johnson syndrome. Advised patient to stop taking Lamictal and contact office immediately if rash develops and to seek urgent medical attention if rash is severe and/or spreading quickly.  Discussed potential benefits, risk, and side effects of benzodiazepines to include potential risk of tolerance and dependence, as well as possible drowsiness.  Advised patient not to drive if experiencing drowsiness and to take lowest possible effective dose to minimize risk of dependence and tolerance.   Discussed Lithium and potential side effects.  Diagnoses and all orders for this visit:  High risk medication use -     Lithium level -     TSH -     Comprehensive metabolic panel  Major depressive disorder, recurrent episode, moderate (HCC)  Generalized anxiety disorder -     LORazepam (ATIVAN) 1 MG tablet; Take 1 tablet (1 mg total) by mouth 2 (two) times daily.  PTSD (post-traumatic stress disorder)  Bipolar I disorder (HCC) -     lamoTRIgine (LAMICTAL) 200 MG tablet; Take 1 tablet (200 mg total) by mouth at bedtime.  Insomnia, unspecified type     Please see After Visit Summary for patient specific instructions.  Future Appointments  Date Time Provider [place]/2021  1:00 PM Cottle, BambiLucious GrovesW [facility] None  12/14/2019 11:30 AM AquinCameron SprangLBN-LBNG None  12/15/2019  1:00 PM Cottle, Bambi G, LCSW [facility] None  12/22/2019  1:00 PM Cottle, Bambi G, LCSW [facility] None  12/28/2019  2:00 PM Ashyah Quizon, ReginBerdie OgrenCP-CP None  12/29/2019  1:00 PM Cottle, Bambi G, LCSW [facility] None  03/28/2020 11:30 AM Andy,Leamon ArntLBPC-[facility] P[facility]    Orders Placed This Encounter  Procedures  . Lithium level  . TSH  . Comprehensive metabolic panel     -------------------------------

## 2019-12-04 LAB — COMPREHENSIVE METABOLIC PANEL
AG Ratio: 1.4 (calc) (ref 1.0–2.5)
ALT: 13 U/L (ref 6–29)
AST: 14 U/L (ref 10–35)
Albumin: 4.1 g/dL (ref 3.6–5.1)
Alkaline phosphatase (APISO): 104 U/L (ref 37–153)
BUN/Creatinine Ratio: 13 (calc) (ref 6–22)
BUN: 27 mg/dL — ABNORMAL HIGH (ref 7–25)
CO2: 24 mmol/L (ref 20–32)
Calcium: 9.6 mg/dL (ref 8.6–10.4)
Chloride: 109 mmol/L (ref 98–110)
Creat: 2.01 mg/dL — ABNORMAL HIGH (ref 0.60–0.93)
Globulin: 2.9 g/dL (calc) (ref 1.9–3.7)
Glucose, Bld: 93 mg/dL (ref 65–99)
Potassium: 4.5 mmol/L (ref 3.5–5.3)
Sodium: 142 mmol/L (ref 135–146)
Total Bilirubin: 1 mg/dL (ref 0.2–1.2)
Total Protein: 7 g/dL (ref 6.1–8.1)

## 2019-12-04 LAB — TSH: TSH: 1.92 mIU/L (ref 0.40–4.50)

## 2019-12-04 LAB — LITHIUM LEVEL: Lithium Lvl: <0.3 mmol/L — ABNORMAL LOW (ref 0.6–1.2)

## 2019-12-06 ENCOUNTER — Telehealth: Payer: Self-pay | Admitting: Adult Health

## 2019-12-06 NOTE — Telephone Encounter (Signed)
Pls advise patient I willl call sometime today to discuss.

## 2019-12-06 NOTE — Telephone Encounter (Signed)
Pt letting you know she got her labs done on 9/24. Results are in [facility]. Please call to advise.

## 2019-12-06 NOTE — Telephone Encounter (Signed)
Noted thanks °

## 2019-12-08 ENCOUNTER — Telehealth: Payer: Self-pay | Admitting: Adult Health

## 2019-12-08 ENCOUNTER — Ambulatory Visit (INDEPENDENT_AMBULATORY_CARE_PROVIDER_SITE_OTHER): Payer: Medicare Other | Admitting: Psychology

## 2019-12-08 ENCOUNTER — Other Ambulatory Visit: Payer: Self-pay

## 2019-12-08 ENCOUNTER — Other Ambulatory Visit: Payer: Self-pay | Admitting: Adult Health

## 2019-12-08 DIAGNOSIS — F314 Bipolar disorder, current episode depressed, severe, without psychotic features: Secondary | ICD-10-CM

## 2019-12-08 MED ORDER — LITHIUM CARBONATE 300 MG PO CAPS: 300.0000 mg | ORAL_CAPSULE | Freq: Every day | ORAL | 2 refills | Status: DC

## 2019-12-08 NOTE — Telephone Encounter (Signed)
Does she have a 6 month standing order for Lithium ? If not please put order in.

## 2019-12-08 NOTE — Telephone Encounter (Signed)
Please review

## 2019-12-08 NOTE — Telephone Encounter (Signed)
Rx for Lithium 300 mg daily sent per request

## 2019-12-08 NOTE — Telephone Encounter (Signed)
Ok to send in.  

## 2019-12-08 NOTE — Telephone Encounter (Signed)
Pt increased her Lithium and will take her last pill tonight. Pt would like refill sent in at [facility]  in [place]. Pt also will need a order for her Lithium level to be sent to [facility].

## 2019-12-08 NOTE — Telephone Encounter (Signed)
We are doing a lab in a week.

## 2019-12-08 NOTE — Telephone Encounter (Signed)
Reviewed thanks! 

## 2019-12-13 ENCOUNTER — Ambulatory Visit: Payer: Medicare Other | Admitting: Adult Health

## 2019-12-13 ENCOUNTER — Telehealth: Payer: Self-pay | Admitting: Adult Health

## 2019-12-13 DIAGNOSIS — Z79899 Other long term (current) drug therapy: Secondary | ICD-10-CM

## 2019-12-13 NOTE — Telephone Encounter (Signed)
Completed.

## 2019-12-13 NOTE — Telephone Encounter (Signed)
Pt called and is going to [facility] in [place], [name] and needs an order to have her lithium level tomorrow. She would like to pick it up here.  [phone].

## 2019-12-13 NOTE — Telephone Encounter (Signed)
Please put in lab order to print off for patient

## 2019-12-14 ENCOUNTER — Encounter: Payer: Self-pay | Admitting: Neurology

## 2019-12-14 ENCOUNTER — Ambulatory Visit (INDEPENDENT_AMBULATORY_CARE_PROVIDER_SITE_OTHER): Payer: Medicare Other | Admitting: Neurology

## 2019-12-14 ENCOUNTER — Other Ambulatory Visit: Payer: Self-pay

## 2019-12-14 VITALS — BP 128/78 | HR 76 | Ht 64.0 in | Wt 216.8 lb

## 2019-12-14 DIAGNOSIS — R29898 Other symptoms and signs involving the musculoskeletal system: Secondary | ICD-10-CM | POA: Diagnosis not present

## 2019-12-14 DIAGNOSIS — G40009 Localization-related (focal) (partial) idiopathic epilepsy and epileptic syndromes with seizures of localized onset, not intractable, without status epilepticus: Secondary | ICD-10-CM | POA: Diagnosis not present

## 2019-12-14 NOTE — Progress Notes (Signed)
0

## 2019-12-14 NOTE — Patient Instructions (Signed)
Good to see you!  1. Continue all your medications for now  2. Schedule 24-hour EEG. We will call with results and further instructions if we will wean off Keppra  3. Continue Lamictal as prescribed by Psychiatry. Discuss Lithium concerns  4. Referral will be sent to Neurorehab for leg weakness  5. Follow-up in 6 months, call for any changes   Seizure Precautions: 1. If medication has been prescribed for you to prevent seizures, take it exactly as directed.  Do not stop taking the medicine without talking to your doctor first, even if you have not had a seizure in a long time.   2. Avoid activities in which a seizure would cause danger to yourself or to others.  Don't operate dangerous machinery, swim alone, or climb in high or dangerous places, such as on ladders, roofs, or girders.  Do not drive unless your doctor says you may.  3. If you have any warning that you may have a seizure, lay down in a safe place where you can't hurt yourself.    4.  No driving for 6 months from last seizure, as per [facility].   Please refer to the following link on the [place] website for more information: http://[email or web address].org/answerplace/[email or web address]/driving/drivingu.cfm   5.  Maintain good sleep hygiene. Avoid alcohol.  6.  Contact your doctor if you have any problems that may be related to the medicine you are taking.  7.  Call 911 and bring the patient back to the ED if:        A.  The seizure lasts longer than 5 minutes.       B.  The patient doesn't awaken shortly after the seizure  C.  The patient has new problems such as difficulty seeing, speaking or moving  D.  The patient was injured during the seizure  E.  The patient has a temperature over 102 F (39C)  F.  The patient vomited and now is having trouble breathing

## 2019-12-14 NOTE — Progress Notes (Signed)
NEUROLOGY FOLLOW UP OFFICE NOTE  Tiffany [name] [id number] 1950/06/22  HISTORY OF PRESENT ILLNESS: I had the pleasure of seeing Tiffany [name] in follow-up in the neurology clinic on 12/14/2019. Her husband is present to provide additional information. She was last seen 7 months ago after hospitalization in February 2021 for confusion with abnormal EEG. She had tremors, encephalopathy. EEG showed diffuse slowing, at times with triphasic morphology, as well as sharp waves in the left temporoparietal region, at times periodic. She was in renal failure, which resolved. She was discharged on Lamotrigine 134m qhs (she was taking 3039mdaily for psychiatric issues) and low dose Levetiracetam 25024mID. Lamotrigine increased to 200m8ms by her psychiatrist. Since her last visit, her husband reports slight confusion, memory changes, repeating herself, but not like in February. She feels sluggish, writing has changed. Her husband fixes her pillbox, she is able to take them independently and denies forgetting medications. She does not feel comfortable driving. No staring/unresponsive episodes, focal numbness/tingling/weakness, myoclonic jerks. She has had anosmia since COVID infection in January. A few days ago she smelled kerosene for a whole day and could not sleep in that room. She has a lot of weakness in her legs and feels this is due to being unable to do neurorehab exercises. She tries to walk, on occasion she uses her walker. She had a fall 4 months ago and was able to get up by herself, no injuries. She denies any headaches, dizziness. Sleep is off and on.  She has not felt like herself since Lithium was increased 2 weeks ago. Her husband notes the increased cognitive changes seemed to occur with increase in Lithium dose.    History on Initial Assessment 06/07/2019: This is a pleasant 68 y30r old right-handed woman with a history of bipolar disorder, PTSD, drug-induced parkinsonism, recently admitted for  confusion with abnormal EEG, presenting to establish care. She was [facility] for worsening gait, confusion, and tremors. She contracted COVID and symptoms significantly worsened, leading to a fall. She does not remember a lot of it, she apparently fell and called out to her husband. Her husband reported waxing and waning confusion, seemingly worse at night. She had an MRI brain without contrast which I personally reviewed, no acute changes. 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 CBC showed a WBC of 17.8, acute on chronic renal failure creatinine 2.67. Her Lithium level was 0.87. Lamotrigine level 15.3.Her Synthroid and Lamotrigine were held due to concern this was contributing to tremors/encephalopathy. She had an EEG showing diffuse slowing, at times with triphasic morphology. There were also sharp waves in the left temporoparietal region, maximal at P7/P3, at times periodic for 10-15 seconds without clear evolution and no clinical changes seen. She was restarted on lower dose Lamotrigine 100mg58m (previously on 300mg 36my), and low dose Levetiracetam 250mg B87mas added. Renal failure resolved. Synthroid resumed at lower dose. Sertraline was added by psychiatry initially, then stopped to streamline medications. She had balance deficits and cognitive deficits with delayed processing and poor awareness of deficits.   She has a history of intermittent Lithium toxicity, she has been on this for at least 10 years. 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 She has tried benztropine, trihexyphenidyl, Sinemet, selegiline, Requip, Azilect, with minimal benefit. The tremors had significantly improved with medication changes made in the hospital, she  now has minimal tremors. She is happy to report that she can now write legibly. She has a  history of tardive dyskinesia and was started on Ingrezza. Initially they felt there was not much benefit after she missed a week due to insurance issues, but they report today that her mouth movements have worsened and would like to resume Ingrezza. She has been mostly dealing with a lot of pain since hospital discharge, she has severe arthritis pain in her feet, knees, wrist/elbow. Tramadol was not helping. She sees Rheumatology and had been on Cimzia which was also held. She reports pain has been daily but she is not in pain today. She still has loss of taste. She had a migraine this morning, she has a remote history of migraines and has not had any in over a year. She used to take Excedrin but had an itching reaction recently. Her husband denies any staring/unresponsive episodes. She denies any olfactory/gustatory hallucinations, deja vu, rising epigastric sensation, focal numbness/tingling/weakness, myoclonic jerks. She denies any dizziness, diplopia, dysarthria/dysphagia, focal numbness/tingling/weakness, bowel/bladder dysfunction. She has lorazepam 63m qhs and takes it as needed during the day for anxiety. She reports depression is better, she denies any suicidal ideation.  Epilepsy Risk Factors:  Her mother had seizures. Otherwise she had a normal birth and early development.  There is no history of febrile convulsions, CNS infections such as meningitis/encephalitis, significant traumatic brain injury, neurosurgical procedures    PAST MEDICAL HISTORY: Past Medical History:  Diagnosis Date  . Bipolar 1 disorder ([facility]   . Breast cancer ([facility] [place]   . Chronic diarrhea    loose stools twice a day on average for years  . Chronic kidney disease (CKD), stage III (moderate) (HCC)   . Depression 1987  . 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 1.4 cm  Grade I invasive lobular, ER  and PR +/Her--, Ki 67 <5% Tried anastrozole for one month, but developed suicidal idea  . Osteoarthritis, knee 09/24/2019   Xray 09/2019  . Parkinson's disease ([facility] [place]  . Psoriatic arthritis ([facility]   . PTSD (post-traumatic stress disorder)   . Secondary hyperparathyroidism ([facility]   . Tardive dyskinesia     MEDICATIONS: Current Outpatient Medications on File Prior to Visit  Medication Sig Dispense Refill  . amLODipine (NORVASC) 2.5 MG tablet Take 1 tablet (2.5 mg total) by mouth daily. 30 tablet 1  . calcitRIOL (ROCALTROL) 0.25 MCG capsule Take 1 capsule (0.25 mcg total) by mouth every Monday, Wednesday, and Friday. 12 capsule 0  . Certolizumab Pegol (CIMZIA) 2 X 200 MG KIT     . hydrOXYzine (ATARAX/VISTARIL) 10 MG tablet Take 1 tablet (10 mg total) by mouth 3 (three) times daily as needed for anxiety. 60 tablet 1  . lamoTRIgine (LAMICTAL) 200 MG tablet Take 1 tablet (200 mg total) by mouth at bedtime. 90 tablet 3  . levETIRAcetam (KEPPRA) 250 MG tablet Take 1 tablet (250 mg total) by mouth 2 (two) times daily. 60 tablet 1  . levothyroxine (SYNTHROID) 112 MCG tablet Take 1 tablet (112 mcg total) by mouth daily at 6 (six) AM. 30 tablet 1  . lithium carbonate 300 MG capsule Take 1 capsule (300 mg total) by mouth at bedtime. 30 capsule 2  . LORazepam (ATIVAN) 1 MG tablet Take 1 tablet (1 mg total) by mouth 2 (two) times daily. 60 tablet 2  . sodium  bicarbonate 650 MG tablet Take 1 tablet (650 mg total) by mouth 2 (two) times daily. 60 tablet 0   No current facility-administered medications on file prior to visit.    ALLERGIES: Allergies  Allergen Reactions  . Aripiprazole Other (See Comments)    Parkinsonism     . Lactose Intolerance (Gi) Diarrhea  . Methotrexate Other (See Comments)    Hair loss, severe stomatitis    . Cefdinir Diarrhea    Other reaction(s): Diarrhea Yeast infection and fever; negative c diff  . Etanercept Other (See Comments)    Headaches    . Exemestane  Other (See Comments)    Suicidal thoughts with medication    . Fluoxetine Other (See Comments)    Parkinsonism  . Methylprednisolone Sodium Succ Other (See Comments)    Agitated mania  . Epinephrine Palpitations    tachycardia   . Nitrofurantoin Nausea And Vomiting and Rash         FAMILY HISTORY: Family History  Problem Relation Age of Onset  . Cancer Mother   . Cancer Sister   . Stroke Maternal Grandfather   . Diabetes Paternal Grandfather   . Cancer Sister     SOCIAL HISTORY: Social History   Socioeconomic History  . Marital status: Married    Spouse name: Not on file  . Number of children: Not on file  . Years of education: Not on file  . Highest education level: Not on file  Occupational History  . Not on file  Tobacco Use  . Smoking status: Never Smoker  . Smokeless tobacco: Never Used  Vaping Use  . Vaping Use: Never used  Substance and Sexual Activity  . Alcohol use: Never  . Drug use: Never  . Sexual activity: Not Currently  Other Topics Concern  . Not on file  Social History Narrative   Moved to area from [place] 08/2018   Lives one story home   Right handed.   Social Determinants of Health   Financial Resource Strain:   . Difficulty of Paying Living Expenses: Not on file  Food Insecurity:   . Worried About Charity fundraiser in the Last Year: Not on file  . Ran Out of Food in the Last Year: Not on file  Transportation Needs:   . Lack of Transportation (Medical): Not on file  . Lack of Transportation (Non-Medical): Not on file  Physical Activity:   . Days of Exercise per Week: Not on file  . Minutes of Exercise per Session: Not on file  Stress:   . Feeling of Stress : Not on file  Social Connections:   . Frequency of Communication with Friends and Family: Not on file  . Frequency of Social Gatherings with Friends and Family: Not on file  . Attends Religious Services: Not on file  . Active Member of Clubs or Organizations: Not on file   . Attends Archivist Meetings: Not on file  . Marital Status: Not on file  Intimate Partner Violence:   . Fear of Current or Ex-Partner: Not on file  . Emotionally Abused: Not on file  . Physically Abused: Not on file  . Sexually Abused: Not on file     PHYSICAL EXAM: Vitals:   12/14/19 1123  BP: 128/78  Pulse: 76  SpO2: 96%   General: No acute distress. She has tardive dyskinesia with chewing mouth movements Head:  Normocephalic/atraumatic Skin/Extremities: No rash, no edema Neurological Exam: alert and oriented to person, place, and time.  No aphasia or dysarthria. Fund of knowledge is appropriate.  Recent and remote memory are intact.  Attention and concentration are normal. Cranial nerves: Pupils equal, round. Extraocular movements intact with no nystagmus. Visual fields full.  No facial asymmetry.  Motor: Bulk and tone normal, muscle strength 5/5 throughout with no pronator drift.   Finger to nose testing intact.  Gait slow and cautious.   24-hour, wean off kep  IMPRESSION: This is a pleasant 69 yo RH woman with a history of bipolar disorder, PTSD, drug-induced parkinsonism, who had an episode of confusion with metabolic encephalopathy, however EEG showed left temporoparietal sharp waves. MRI showed chronic left frontal infarct. She and her husband deny any further similar symptoms since February, she appears to have had more cognitive changes again with increase in Lithium dose, discuss with Psychiatry. Her Lamotrigine dose has been increased to 268m daily as well. We will try to streamline medications and wean off low dose Levetiracetam if 24-hour EEG is normal. She is reporting more leg weakness and will be referred for PT. She is aware of [place] driving laws to stop driving after an episode of loss of awareness until 6 months seizure-free. Follow-up in 6 months, they know to call for any changes.    Thank you for allowing me to participate in her care.  Please do not  hesitate to call for any questions or concerns.   KEllouise Newer M.D.   CC: Dr. AJonni Sanger

## 2019-12-15 ENCOUNTER — Telehealth: Payer: Self-pay | Admitting: Adult Health

## 2019-12-15 ENCOUNTER — Ambulatory Visit (INDEPENDENT_AMBULATORY_CARE_PROVIDER_SITE_OTHER): Payer: Medicare Other | Admitting: Psychology

## 2019-12-15 DIAGNOSIS — F314 Bipolar disorder, current episode depressed, severe, without psychotic features: Secondary | ICD-10-CM

## 2019-12-15 LAB — BASIC METABOLIC PANEL
BUN/Creatinine Ratio: 15 (calc) (ref 6–22)
BUN: 31 mg/dL — ABNORMAL HIGH (ref 7–25)
CO2: 26 mmol/L (ref 20–32)
Calcium: 9.6 mg/dL (ref 8.6–10.4)
Chloride: 106 mmol/L (ref 98–110)
Creat: 2.02 mg/dL — ABNORMAL HIGH (ref 0.60–0.93)
Glucose, Bld: 94 mg/dL (ref 65–99)
Potassium: 4.5 mmol/L (ref 3.5–5.3)
Sodium: 140 mmol/L (ref 135–146)

## 2019-12-15 LAB — LITHIUM LEVEL: Lithium Lvl: 0.5 mmol/L — ABNORMAL LOW (ref 0.6–1.2)

## 2019-12-15 NOTE — Telephone Encounter (Signed)
Called and spoke with patient to discuss Lithium level - Kidney function tests. Continue Lithium at 300mg  daily. Will recheck labs in 1 month. Patient mood improved with increase.

## 2019-12-16 ENCOUNTER — Telehealth: Payer: Self-pay | Admitting: Adult Health

## 2019-12-16 NOTE — Telephone Encounter (Signed)
Pt wants to know if regina wants her to have her lithium level lab done. Please give her a call and let her know at [phone]. She wants to have it done on tuesday

## 2019-12-20 NOTE — Telephone Encounter (Signed)
Called and spoke with patient.

## 2019-12-20 NOTE — Telephone Encounter (Signed)
Reviewed

## 2019-12-20 NOTE — Telephone Encounter (Signed)
Pt has not heard back about this. Does she need to get lithium level on 10/12?

## 2019-12-20 NOTE — Telephone Encounter (Signed)
Please advise 

## 2019-12-22 ENCOUNTER — Ambulatory Visit (INDEPENDENT_AMBULATORY_CARE_PROVIDER_SITE_OTHER): Payer: Medicare Other | Admitting: Psychology

## 2019-12-22 DIAGNOSIS — F314 Bipolar disorder, current episode depressed, severe, without psychotic features: Secondary | ICD-10-CM

## 2019-12-23 ENCOUNTER — Telehealth: Payer: Self-pay | Admitting: Adult Health

## 2019-12-23 NOTE — Telephone Encounter (Signed)
Sure, we can discuss TMS at next appt.

## 2019-12-23 NOTE — Telephone Encounter (Signed)
Pt called to report, she is not doing that well. For 3 days agitated mania. Now depressed & fatigued. Wants to talk about [place] @ next apt on 10/19. Contact # [phone]

## 2019-12-24 NOTE — Telephone Encounter (Signed)
Left detailed information and call back if she needs something before then

## 2019-12-28 ENCOUNTER — Ambulatory Visit (INDEPENDENT_AMBULATORY_CARE_PROVIDER_SITE_OTHER): Payer: Medicare Other | Admitting: Adult Health

## 2019-12-28 ENCOUNTER — Other Ambulatory Visit: Payer: Self-pay

## 2019-12-28 DIAGNOSIS — F4310 Post-traumatic stress disorder, unspecified: Secondary | ICD-10-CM

## 2019-12-28 DIAGNOSIS — F411 Generalized anxiety disorder: Secondary | ICD-10-CM | POA: Diagnosis not present

## 2019-12-28 DIAGNOSIS — F331 Major depressive disorder, recurrent, moderate: Secondary | ICD-10-CM

## 2019-12-28 DIAGNOSIS — G4700 Insomnia, unspecified: Secondary | ICD-10-CM | POA: Diagnosis not present

## 2019-12-28 DIAGNOSIS — F319 Bipolar disorder, unspecified: Secondary | ICD-10-CM | POA: Diagnosis not present

## 2019-12-28 NOTE — Progress Notes (Signed)
Tiffany [name] °[id number] °01/06/1951 °69 y.o. ° °Subjective:  ° °Patient ID:  Tiffany [name] is a 69 y.o. (DOB 04/16/1950) female. ° °Chief Complaint: No chief complaint on file. ° ° °HPI °Tiffany [name] presents to the office today for follow-up of PTSD, insomnia, GAD, BPD 1. ° °Describes mood today as "not the best". Pleasant. Tearful at times. Mood symptoms - reports depression, anxiety, and irritability. Has decreased Ativan use - "I take it when I really need it". Reports mood instability - "my mood is up and down". °Doesn't feel like the Lithium has helped with the ups and down. Considering TMS therapy. Stating "I wonder if I would be a candidate. Feels like she is having mental and physical side effects of lithium. Feels more aware off the Lithium. Stating "I have to pay a price either way". Chronic pain with psoriatic arthritis - having a "flare". Flares usually last 3 to 4 days. Trying to educate herself on things that will be helpful. Driving more - has some independence. Seeing therapist - [name]. Varying interest and motivation. Taking medications as prescribed. °Energy levels decreased - "lots of fatigue". Active, unable to exercise with current physical disabilities.  °Enjoys some usual interests and activities. Married. Lives with husband of 48 years and their daughter. Talking to family and friends.  °Appetite stable. Weight stable 211 pounds. °Sleeping difficulties. Averages 7 hours on a good night.  °Focus and concentration is "ok". Completing tasks. Managing some aspects of household.   °Denies SI or HI. Denies AH or VH.  ° °Previous medications: Celexa, Zyprexa, Tegretol, Depakote, Serzone, Topamax, Seroquel, Effexor, Lexapro, Desipramine, Neurontin, Abilify, Geodon, Propanolol, Cymbalta, Cogentin, Trihexyphenadyl, Simet, Provigil, Selegiline, Requip, Amantadine, Prozac, Mirapex, Azilect, Metoclopramide, Baclofen, Artane, Namenda, Latuda. ° °GAD-7   °  Office Visit from 05/14/2019 in  [facility]  °Total GAD-7 Score 21  °  °Mini-Mental   °  Office Visit from 06/07/2019 in [facility]  °Total Score (max 30 points ) 29  °  °PHQ2-9   °  Office Visit from 09/16/2019 in [facility] Office Visit from 08/24/2019 in [facility] Office Visit from 05/14/2019 in [facility]  °PHQ-2 Total Score 0 0 1  °PHQ-9 Total Score -- 4 8  °  °  ° °Review of Systems:  °Review of Systems  °Musculoskeletal: Negative for gait problem.  °Neurological: Negative for tremors.  °Psychiatric/Behavioral:  °     Please refer to HPI  ° ° °Medications: I have reviewed the patient's current medications. ° °Current Outpatient Medications  °Medication Sig Dispense Refill  °• amLODipine (NORVASC) 2.5 MG tablet Take 1 tablet (2.5 mg total) by mouth daily. 30 tablet 1  °• calcitRIOL (ROCALTROL) 0.25 MCG capsule Take 1 capsule (0.25 mcg total) by mouth every Monday, Wednesday, and Friday. 12 capsule 0  °• Certolizumab Pegol (CIMZIA) 2 X 200 MG KIT     °• hydrOXYzine (ATARAX/VISTARIL) 10 MG tablet Take 1 tablet (10 mg total) by mouth 3 (three) times daily as needed for anxiety. 60 tablet 1  °• lamoTRIgine (LAMICTAL) 200 MG tablet Take 1 tablet (200 mg total) by mouth at bedtime. 90 tablet 3  °• levETIRAcetam (KEPPRA) 250 MG tablet Take 1 tablet (250 mg total) by mouth 2 (two) times daily. 60 tablet 1  °• levothyroxine (SYNTHROID) 112 MCG tablet Take 1 tablet (112 mcg total) by mouth daily at 6 (six) AM. 30 tablet 1  °• lithium carbonate   300 MG capsule Take 1 capsule (300 mg total) by mouth at bedtime. 30 capsule 2  °• LORazepam (ATIVAN) 1 MG tablet Take 1 tablet (1 mg total) by mouth 2 (two) times daily. 60 tablet 2  °• sodium bicarbonate 650 MG tablet Take 1 tablet (650 mg total) by mouth 2 (two) times daily. 60 tablet 0  ° °No current facility-administered medications for this visit.  ° ° °Medication Side Effects: None ° °Allergies:   °Allergies  °Allergen Reactions  °• Aripiprazole Other (See Comments)  °  Parkinsonism °  °  °• Lactose Intolerance (Gi) Diarrhea  °• Methotrexate Other (See Comments)  °  Hair loss, severe stomatitis ° °  °• Cefdinir Diarrhea  °  Other reaction(s): Diarrhea °Yeast infection and fever; negative c diff  °• Etanercept Other (See Comments)  °  Headaches ° °  °• Exemestane Other (See Comments)  °  Suicidal thoughts with medication ° °  °• Fluoxetine Other (See Comments)  °  Parkinsonism  °• Methylprednisolone Sodium Succ Other (See Comments)  °  Agitated mania  °• Epinephrine Palpitations  °  tachycardia °  °• Nitrofurantoin Nausea And Vomiting and Rash  °     ° ° °Past Medical History:  °Diagnosis Date  °• Bipolar 1 disorder (HCC)   °• Breast cancer (HCC)   °• Chronic diarrhea   ° loose stools twice a day on average for years  °• Chronic kidney disease (CKD), stage III (moderate) (HCC)   °• Depression 1987  °• Malignant neoplasm of overlapping sites of left breast in female, estrogen receptor positive (HCC) 03/14/2016  ° Dx in 09/2014, s/p bilateral mastectomies and ALND, 0/10 LN. 1.4 cm  Grade I invasive lobular, ER and PR +/Her--, Ki 67 <5% Tried anastrozole for one month, but developed suicidal idea  °• Osteoarthritis, knee 09/24/2019  ° Xray 09/2019  °• Parkinson's disease (HCC) 2012  °• Psoriatic arthritis (HCC)   °• PTSD (post-traumatic stress disorder)   °• Secondary hyperparathyroidism (HCC)   °• Tardive dyskinesia   ° ° °Family History  °Problem Relation Age of Onset  °• Cancer Mother   °• Cancer Sister   °• Stroke Maternal Grandfather   °• Diabetes Paternal Grandfather   °• Cancer Sister   ° ° °Social History  ° °Socioeconomic History  °• Marital status: Married  °  Spouse name: Not on file  °• Number of children: Not on file  °• Years of education: Not on file  °• Highest education level: Not on file  °Occupational History  °• Not on file  °Tobacco Use  °• Smoking status: Never Smoker  °• Smokeless tobacco:  Never Used  °Vaping Use  °• Vaping Use: Never used  °Substance and Sexual Activity  °• Alcohol use: Never  °• Drug use: Never  °• Sexual activity: Not Currently  °Other Topics Concern  °• Not on file  °Social History Narrative  ° Moved to area from [place] 08/2018  ° Lives one story home  ° Right handed.  ° °Social Determinants of Health  ° °Financial Resource Strain:   °• Difficulty of Paying Living Expenses: Not on file  °Food Insecurity:   °• Worried About Running Out of Food in the Last Year: Not on file  °• Ran Out of Food in the Last Year: Not on file  °Transportation Needs:   °• Lack of Transportation (Medical): Not on file  °• Lack of Transportation (Non-Medical): Not on file  °Physical Activity:   °•   Days of Exercise per Week: Not on file   Minutes of Exercise per Session: Not on file  Stress:    Feeling of Stress : Not on file  Social Connections:    Frequency of Communication with Friends and Family: Not on file   Frequency of Social Gatherings with Friends and Family: Not on file   Attends Religious Services: Not on file   Active Member of Clubs or Organizations: Not on file   Attends Archivist Meetings: Not on file   Marital Status: Not on file  Intimate Partner Violence:    Fear of Current or Ex-Partner: Not on file   Emotionally Abused: Not on file   Physically Abused: Not on file   Sexually Abused: Not on file    Past Medical History, Surgical history, Social history, and Family history were reviewed and updated as appropriate.   Please see review of systems for further details on the patient's review from today.   Objective:   Physical Exam:  There were no vitals taken for this visit.  Physical Exam Constitutional:      General: She is not in acute distress. Musculoskeletal:        General: No deformity.  Neurological:     Mental Status: She is alert and oriented to person, place, and time.     Coordination: Coordination normal.   Psychiatric:        Attention and Perception: Attention and perception normal. She does not perceive auditory or visual hallucinations.        Mood and Affect: Mood normal. Mood is not anxious or depressed. Affect is not labile, blunt, angry or inappropriate.        Speech: Speech normal.        Behavior: Behavior normal.        Thought Content: Thought content normal. Thought content is not paranoid or delusional. Thought content does not include homicidal or suicidal ideation. Thought content does not include homicidal or suicidal plan.        Cognition and Memory: Cognition and memory normal.        Judgment: Judgment normal.     Comments: Insight intact     Lab Review:     Component Value Date/Time   NA 140 12/14/2019 1018   NA 138 06/08/2019 0000   K 4.5 12/14/2019 1018   CL 106 12/14/2019 1018   CO2 26 12/14/2019 1018   GLUCOSE 94 12/14/2019 1018   BUN 31 (H) 12/14/2019 1018   BUN 29 (A) 06/08/2019 0000   CREATININE 2.02 (H) 12/14/2019 1018   CALCIUM 9.6 12/14/2019 1018   PROT 7.0 12/03/2019 1105   ALBUMIN 4.0 09/10/2019 0851   AST 14 12/03/2019 1105   ALT 13 12/03/2019 1105   ALKPHOS 125 09/10/2019 0851   BILITOT 1.0 12/03/2019 1105   GFRNONAA 22 (L) 09/13/2019 1243   GFRAA 25 (L) 09/13/2019 1243       Component Value Date/Time   WBC 13.0 (H) 09/08/2019 1347   RBC 4.95 09/08/2019 1347   HGB 14.4 09/08/2019 1347   HCT 46.0 09/08/2019 1347   PLT 220 09/08/2019 1347   MCV 92.9 09/08/2019 1347   MCH 29.1 09/08/2019 1347   MCHC 31.3 09/08/2019 1347   RDW 13.1 09/08/2019 1347   LYMPHSABS 2.5 04/27/2019 0544   MONOABS 0.5 04/27/2019 0544   EOSABS 0.2 04/27/2019 0544   BASOSABS 0.0 04/27/2019 0544    Lithium Lvl  Date Value Ref Range Status  12/14/2019  0.5 (L) 0.6 - 1.2 mmol/L Final  °  ° °No results found for: PHENYTOIN, PHENOBARB, VALPROATE, CBMZ  ° °.res °Assessment: Plan:   ° °Plan: ° °Lorazepam 1mg BID for anxiety - make take one tablet extra for severe  anxiety symptoms. °Ingrezza 40mg daily   °Hydroxyzine 10mg TID for anxiety °Lamictal 200mg at hs °Lithium 300mg daily  ° °Labs - lithium level, TSH, CMP in one month ° °Also taking Keppra 250mg BID for seizures ° °Therapist - [name] - seeing every 2 weeks ° °Information give for [facility] ° °RTC 4 weeks ° °Counseled patient regarding potential benefits, risks, and side effects of Lamictal to include potential risk of Stevens-Johnson syndrome. Advised patient to stop taking Lamictal and contact office immediately if rash develops and to seek urgent medical attention if rash is severe and/or spreading quickly. ° °Discussed potential benefits, risk, and side effects of benzodiazepines to include potential risk of tolerance and dependence, as well as possible drowsiness.  Advised patient not to drive if experiencing drowsiness and to take lowest possible effective dose to minimize risk of dependence and tolerance.  ° ° ° °Diagnoses and all orders for this visit: ° °PTSD (post-traumatic stress disorder) ° °Bipolar I disorder (HCC) ° °Insomnia, unspecified type ° °Generalized anxiety disorder ° °Major depressive disorder, recurrent episode, moderate (HCC) ° °  ° °Please see After Visit Summary for patient specific instructions. ° °Future Appointments  °Date Time Provider Department Center  °12/29/2019 11:15 AM Evans, Brent M, DPM [facility]  °12/29/2019  1:00 PM Cottle, Bambi G, LCSW [facility] None  °01/05/2020  1:00 PM Cottle, Bambi G, LCSW [facility] None  °01/12/2020  9:30 AM [facility] EEG TECH [facility] None  °01/12/2020  1:00 PM Cottle, Bambi G, LCSW [facility] None  °01/19/2020  1:00 PM Cottle, Bambi G, LCSW [facility] None  °01/25/2020  1:00 PM ,  Nattalie, NP [facility] None  °03/28/2020 11:30 AM Andy, Camille L, MD [facility]  °08/01/2020  4:00 PM Aquino, Karen M, MD [facility] None  ° ° °No orders of the defined types were placed in this encounter. ° ° °------------------------------- °

## 2019-12-29 ENCOUNTER — Ambulatory Visit (INDEPENDENT_AMBULATORY_CARE_PROVIDER_SITE_OTHER): Payer: Medicare Other | Admitting: Podiatry

## 2019-12-29 ENCOUNTER — Ambulatory Visit: Payer: Medicare Other | Admitting: Psychology

## 2019-12-29 ENCOUNTER — Encounter: Payer: Self-pay | Admitting: Adult Health

## 2019-12-29 ENCOUNTER — Ambulatory Visit (INDEPENDENT_AMBULATORY_CARE_PROVIDER_SITE_OTHER): Payer: Medicare Other

## 2019-12-29 DIAGNOSIS — M2042 Other hammer toe(s) (acquired), left foot: Secondary | ICD-10-CM

## 2019-12-29 DIAGNOSIS — L989 Disorder of the skin and subcutaneous tissue, unspecified: Secondary | ICD-10-CM | POA: Diagnosis not present

## 2019-12-29 NOTE — Progress Notes (Signed)
   HPI: 69 y.o. female presenting today for evaluation of a symptomatic callus overlying the second toe of the left foot.  Patient states that it has been very sore and tender for the last 2-3 weeks.  She does not recall any incident or new shoes or change in activity that would have initiated her pain.  She does have a hammertoe to that toe as well.  So she presents for further treatment evaluation  Past Medical History:  Diagnosis Date  . Bipolar 1 disorder ([place])   . Breast cancer ([place])   . Chronic diarrhea    loose stools twice a day on average for years  . Chronic kidney disease (CKD), stage III (moderate) (HCC)   . Depression 1987  . Malignant neoplasm of overlapping sites of left breast in female, estrogen receptor positive ([place]) 03/14/2016   Dx in 09/2014, s/p bilateral mastectomies and ALND, 0/10 LN. 1.4 cm  Grade I invasive lobular, ER and PR +/Her--, Ki 67 <5% Tried anastrozole for one month, but developed suicidal idea  . Osteoarthritis, knee 09/24/2019   Xray 09/2019  . Parkinson's disease ([place]) 2012  . Psoriatic arthritis ([place])   . PTSD (post-traumatic stress disorder)   . Secondary hyperparathyroidism ([place])   . Tardive dyskinesia      Physical Exam: General: The patient is alert and oriented x3 in no acute distress.  Dermatology: Skin is warm, dry and supple bilateral lower extremities. Negative for open lesions or macerations.  Hyperkeratotic preulcerative callus tissue noted at the dorsal PIPJ of the left second toe.  Hammertoe contracture also noted which is contributory to the callus likely rubbing from shoe gear  Vascular: Palpable pedal pulses bilaterally. No edema or erythema noted. Capillary refill within normal limits.  Neurological: Epicritic and protective threshold grossly intact bilaterally.   Musculoskeletal Exam: Range of motion within normal limits to all pedal and ankle joints bilateral. Muscle strength 5/5 in all groups bilateral.  Rigid hammertoe contracture  noted second digits bilateral left greater than right  Assessment: 1.  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  Preulcerative callus PIPJ second digit left   Plan of Care:  1. Patient evaluated.  2.  Excisional debridement of the callus tissue was performed using a chisel blade and tissue nipper without incident or bleeding.  Band-Aid applied. 3.  Recommend wearing good supportive shoes that do not irritate her of the hammertoe deformity 4.  If conservative measures fail we did discuss possibly performing an in office PIPJ arthroplasty of the second digit.  She has had a fourth toe arthroplasty left which healed very well and it alleviated her symptoms. 5.  Return to clinic as needed      Edrick Kins, DPM [facility]  Dr. Edrick Kins, DPM    [address], [place] [address]                Office [phone]  Fax [phone]

## 2020-01-05 ENCOUNTER — Ambulatory Visit (INDEPENDENT_AMBULATORY_CARE_PROVIDER_SITE_OTHER): Payer: Medicare Other | Admitting: Psychology

## 2020-01-05 DIAGNOSIS — F314 Bipolar disorder, current episode depressed, severe, without psychotic features: Secondary | ICD-10-CM

## 2020-01-12 ENCOUNTER — Other Ambulatory Visit: Payer: Self-pay

## 2020-01-12 ENCOUNTER — Ambulatory Visit (INDEPENDENT_AMBULATORY_CARE_PROVIDER_SITE_OTHER): Payer: Medicare Other | Admitting: Neurology

## 2020-01-12 ENCOUNTER — Ambulatory Visit (INDEPENDENT_AMBULATORY_CARE_PROVIDER_SITE_OTHER): Payer: Medicare Other | Admitting: Psychology

## 2020-01-12 DIAGNOSIS — R29898 Other symptoms and signs involving the musculoskeletal system: Secondary | ICD-10-CM

## 2020-01-12 DIAGNOSIS — G40009 Localization-related (focal) (partial) idiopathic epilepsy and epileptic syndromes with seizures of localized onset, not intractable, without status epilepticus: Secondary | ICD-10-CM

## 2020-01-12 DIAGNOSIS — F314 Bipolar disorder, current episode depressed, severe, without psychotic features: Secondary | ICD-10-CM | POA: Diagnosis not present

## 2020-01-19 ENCOUNTER — Ambulatory Visit (INDEPENDENT_AMBULATORY_CARE_PROVIDER_SITE_OTHER): Payer: Medicare Other | Admitting: Psychology

## 2020-01-19 DIAGNOSIS — F314 Bipolar disorder, current episode depressed, severe, without psychotic features: Secondary | ICD-10-CM

## 2020-01-24 ENCOUNTER — Ambulatory Visit (INDEPENDENT_AMBULATORY_CARE_PROVIDER_SITE_OTHER): Payer: Medicare Other | Admitting: Psychology

## 2020-01-24 ENCOUNTER — Telehealth: Payer: Self-pay

## 2020-01-24 DIAGNOSIS — F314 Bipolar disorder, current episode depressed, severe, without psychotic features: Secondary | ICD-10-CM | POA: Diagnosis not present

## 2020-01-24 NOTE — Telephone Encounter (Signed)
-----   Message from Cameron Sprang, MD sent at 01/24/2020  8:53 AM EST ----- Regarding: FW: Your message may not be read Contact: [phone] I replied to her MyChart message, pls see below, it says there was internet email failure, can you pls call her and let her know results and instructions below:    Thanks! ----- Message ----- From: Mychart, Generic Sent: 01/23/2020  11:53 PM EST To: Cameron Sprang, MD Subject: Your message may not be read                      ----- Delivery failure of internet email alert  Tickler type: Message Message Id(WMG): [id number] SMTP Response: [id number] Patient: Thornsberry,Aadvika KMMN([id number]) Internet alert email: [email or web address]     ----- Original WMG message to the patient ----- Sent: 01/23/2020 11:22 PM From: Cameron Sprang, MD To: Lajuana Matte Message Type: Patient Medical Advice Request Subject: Troubling side effects Hi Ms. Leighton,  The 24-hour EEG was normal. We can start weaning off the Keppra, please reduce Keppra to 1 tablet every night for 1 week, then stop. Let me know if any worsening of symptoms, but I hope it will actually help improve symptoms off Keppra.  Take care, KA   ----- Message -----      From:Mirha Ricki Miller      Sent:01/23/2020  7:25 PM EST        BX:[id number] Jacquenette Shone, MD   Subject:Troubling side effects  Dear Dr. Delice Lesch, Since I have been experiencing what may be troubling side effects of Keppra, I wonder when I might expect the results of my 24-hour EEG, to see if I can be tapered off? One side effect, which I had not encountered prior to Keppra, is instances of rage, involving screaming and throwing things.  I did not mention this to you on October 5th, because I did not know that it could be a side effect of Keppra until October 19th, when I saw Dr. Dwaine Gale, who manages much of my medication. The EEG you ordered started at 9:37am on 01/12/2020. I look forward to finding out if the results indicate that I can be tapered  off of Keppra. Thank you, [name] Malesky

## 2020-01-24 NOTE — Telephone Encounter (Signed)
-----   Message from Cameron Sprang, MD sent at 01/24/2020  8:53 AM EST ----- Regarding: FW: Your message may not be read Contact: [phone] I replied to her MyChart message, pls see below, it says there was internet email failure, can you pls call her and let her know results and instructions below:    Thanks! ----- Message ----- From: Mychart, Generic Sent: 01/23/2020  11:53 PM EST To: Cameron Sprang, MD Subject: Your message may not be read                      ----- Delivery failure of internet email alert  Tickler type: Message Message Id(WMG): [id number] SMTP Response: [id number] Patient: Tiffany [name],Tiffany [name]([id number]) Internet alert email: [email or web address]     ----- Original WMG message to the patient ----- Sent: 01/23/2020 11:22 PM From: Cameron Sprang, MD To: Tiffany [name] Message Type: Patient Medical Advice Request Subject: Troubling side effects Hi [name],  The 24-hour EEG was normal. We can start weaning off the Keppra, please reduce Keppra to 1 tablet every night for 1 week, then stop. Let me know if any worsening of symptoms, but I hope it will actually help improve symptoms off Keppra.  Take care, KA   ----- Message -----      From:Tiffany [name]      Sent:01/23/2020  7:25 PM EST        BC:[id number] Jacquenette Shone, MD   Subject:Troubling side effects  Dear Dr. Delice Lesch, Since I have been experiencing what may be troubling side effects of Keppra, I wonder when I might expect the results of my 24-hour EEG, to see if I can be tapered off? One side effect, which I had not encountered prior to Keppra, is instances of rage, involving screaming and throwing things.  I did not mention this to you on October 5th, because I did not know that it could be a side effect of Keppra until October 19th, when I saw Dr. Dwaine Gale, who manages much of my medication. The EEG you ordered started at 9:37am on 01/12/2020. I look forward to finding out if the results indicate that I can be tapered  off of Keppra. Thank you, Tiffany [name]

## 2020-01-24 NOTE — Telephone Encounter (Signed)
Pt called no answer left a voice mail to call the office back  °

## 2020-01-24 NOTE — Telephone Encounter (Signed)
Pt called and informed that dr Delice Lesch had tried to send her this mychart message:   Hi Ms. Taves,   The 24-hour EEG was normal. We can start weaning off the Keppra, please reduce Keppra to 1 tablet every night for 1 week, then stop. Let me know if any worsening of symptoms, but I hope it will actually help improve symptoms off Keppra.   Take care,  KA   Pt verbalized understanding, another mychart message was sent

## 2020-01-25 ENCOUNTER — Encounter: Payer: Self-pay | Admitting: Adult Health

## 2020-01-25 ENCOUNTER — Ambulatory Visit (INDEPENDENT_AMBULATORY_CARE_PROVIDER_SITE_OTHER): Payer: Medicare Other | Admitting: Adult Health

## 2020-01-25 ENCOUNTER — Other Ambulatory Visit: Payer: Self-pay

## 2020-01-25 DIAGNOSIS — F411 Generalized anxiety disorder: Secondary | ICD-10-CM

## 2020-01-25 DIAGNOSIS — F4310 Post-traumatic stress disorder, unspecified: Secondary | ICD-10-CM | POA: Diagnosis not present

## 2020-01-25 DIAGNOSIS — G4700 Insomnia, unspecified: Secondary | ICD-10-CM

## 2020-01-25 DIAGNOSIS — F331 Major depressive disorder, recurrent, moderate: Secondary | ICD-10-CM | POA: Diagnosis not present

## 2020-01-25 DIAGNOSIS — F319 Bipolar disorder, unspecified: Secondary | ICD-10-CM

## 2020-01-25 NOTE — Progress Notes (Signed)
Analyah Mcconnon [name] [id number] 1951-01-19 69 y.o.  Subjective:   Patient ID:  Tiffany [name] is a 69 y.o. (DOB March 27, 1950) female.  Chief Complaint: No chief complaint on file.   HPI Tiffany [name] presents to the office today for follow-up of PTSD, insomnia, GAD, BPD 1.  Describes mood today as "not the best". Pleasant. Tearful at times. Mood symptoms - reports depression, anxiety, and irritability. Reports mood instability - "I feel mostly down". Stating "I'm not doing well". Not sure if symptoms correlated with sister's recent fall. Got concerned, then worried and then started getting sad. Was getting bits and pieces of what was going on. Feels like she is in a "downward spiral". Has not been sleeping. Getting agitated. Ativan helps for anxiety, but not with sleep issues Feels like she is sleep deprived. Lays in bed for 3 hours and cannot sleep. Thoughts racing at times. Recent EEG did not show any seizure activity. Has started a taper off of the Keppra. Seeing therapist - Administrator. Varying interest and motivation. Taking medications as prescribed. Energy levels decreased. Active, unable to exercise with current physical disabilities.  Enjoys some usual interests and activities. Married. Lives with husband of 46 years and their daughter. Talking to family and friends.  Appetite stable. Weight stable 211 pounds. Sleeping difficulties over past 2 weeks - "this last week has been the worse. Averages 3 hours of broken sleep.  Focus and concentration is "ok". Completing tasks. Managing some aspects of household.   Denies SI or HI. Denies AH or VH.   Previous medications: Celexa, Zyprexa, Tegretol, Depakote, Serzone, Topamax, Seroquel, Effexor, Lexapro, Desipramine, Neurontin, Abilify, Geodon, Propanolol, Cymbalta, Cogentin, Trihexyphenadyl, Sinmmet, Provigil, Selegiline, Requip, Amantadine, Prozac, Mirapex, Azilect, Metoclopramide, Baclofen, Artane, Namenda, Latuda.     GAD-7     Office  Visit from 05/14/2019 in [place]  Total GAD-7 Score 21    Mini-Mental     Office Visit from 06/07/2019 in [place]  Total Score (max 30 points ) 29    PHQ2-9     Office Visit from 09/16/2019 in [place] Visit from 08/24/2019 in [place] Visit from 05/14/2019 in [place]  PHQ-2 Total Score 0 0 1  PHQ-9 Total Score -- 4 8       Review of Systems:  Review of Systems  Musculoskeletal: Negative for gait problem.  Neurological: Negative for tremors.  Psychiatric/Behavioral:       Please refer to HPI    Medications: I have reviewed the patient's current medications.  Current Outpatient Medications  Medication Sig Dispense Refill  . amLODipine (NORVASC) 2.5 MG tablet Take 1 tablet (2.5 mg total) by mouth daily. 30 tablet 1  . calcitRIOL (ROCALTROL) 0.25 MCG capsule Take 1 capsule (0.25 mcg total) by mouth every Monday, Wednesday, and Friday. 12 capsule 0  . Certolizumab Pegol (CIMZIA) 2 X 200 MG KIT     . hydrOXYzine (ATARAX/VISTARIL) 10 MG tablet Take 1 tablet (10 mg total) by mouth 3 (three) times daily as needed for anxiety. 60 tablet 1  . lamoTRIgine (LAMICTAL) 200 MG tablet Take 1 tablet (200 mg total) by mouth at bedtime. 90 tablet 3  . levETIRAcetam (KEPPRA) 250 MG tablet Take 1 tablet (250 mg total) by mouth 2 (two) times daily. 60 tablet 1  . levothyroxine (SYNTHROID) 112 MCG tablet Take 1 tablet (112 mcg total) by mouth daily at 6 (six) AM. 30 tablet 1  .  lithium carbonate 300 MG capsule Take 1 capsule (300 mg total) by mouth at bedtime. 30 capsule 2  . LORazepam (ATIVAN) 1 MG tablet Take 1 tablet (1 mg total) by mouth 2 (two) times daily. 60 tablet 2  . sodium bicarbonate 650 MG tablet Take 1 tablet (650 mg total) by mouth 2 (two) times daily. 60 tablet 0   No current facility-administered medications for this visit.    Medication Side  Effects: None  Allergies:  Allergies  Allergen Reactions  . Aripiprazole Other (See Comments)    Parkinsonism     . Lactose Intolerance (Gi) Diarrhea  . Methotrexate Other (See Comments)    Hair loss, severe stomatitis    . Cefdinir Diarrhea    Other reaction(s): Diarrhea Yeast infection and fever; negative c diff  . Etanercept Other (See Comments)    Headaches    . Exemestane Other (See Comments)    Suicidal thoughts with medication    . Fluoxetine Other (See Comments)    Parkinsonism  . Methylprednisolone Sodium Succ Other (See Comments)    Agitated mania  . Epinephrine Palpitations    tachycardia   . Nitrofurantoin Nausea And Vomiting and Rash         Past Medical History:  Diagnosis Date  . Bipolar 1 disorder ([place])   . Breast cancer ([facility])   . Chronic diarrhea    loose stools twice a day on average for years  . Chronic kidney disease (CKD), stage III (moderate) (HCC)   . Depression 1987  . 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 1.4 cm  Grade I invasive lobular, ER and PR +/Her--, Ki 67 <5% Tried anastrozole for one month, but developed suicidal idea  . Osteoarthritis, knee 09/24/2019   Xray 09/2019  . Parkinson's disease ([place]) 2012  . Psoriatic arthritis ([place])   . PTSD (post-traumatic stress disorder)   . Secondary hyperparathyroidism ([place])   . Tardive dyskinesia     Family History  Problem Relation Age of Onset  . Cancer Mother   . Cancer Sister   . Stroke Maternal Grandfather   . Diabetes Paternal Grandfather   . Cancer Sister     Social History   Socioeconomic History  . Marital status: Married    Spouse name: Not on file  . Number of children: Not on file  . Years of education: Not on file  . Highest education level: Not on file  Occupational History  . Not on file  Tobacco Use  . Smoking status: Never Smoker   . Smokeless tobacco: Never Used  Vaping Use  . Vaping Use: Never used  Substance and Sexual Activity  . Alcohol use: Never  . Drug use: Never  . Sexual activity: Not Currently  Other Topics Concern  . Not on file  Social History Narrative   Moved to area from [place] 08/2018   Lives one story home   Right handed.   Social Determinants of Health   Financial Resource Strain:   . Difficulty of Paying Living Expenses: Not on file  Food Insecurity:   . Worried About Charity fundraiser in the Last Year: Not on file  . Ran Out of Food in the Last Year: Not on file  Transportation Needs:   . Lack of Transportation (Medical): Not on file  . Lack of Transportation (Non-Medical): Not on file  Physical  Activity:   . Days of Exercise per Week: Not on file  . Minutes of Exercise per Session: Not on file  Stress:   . Feeling of Stress : Not on file  Social Connections:   . Frequency of Communication with Friends and Family: Not on file  . Frequency of Social Gatherings with Friends and Family: Not on file  . Attends Religious Services: Not on file  . Active Member of Clubs or Organizations: Not on file  . Attends Archivist Meetings: Not on file  . Marital Status: Not on file  Intimate Partner Violence:   . Fear of Current or Ex-Partner: Not on file  . Emotionally Abused: Not on file  . Physically Abused: Not on file  . Sexually Abused: Not on file    Past Medical History, Surgical history, Social history, and Family history were reviewed and updated as appropriate.   Please see review of systems for further details on the patient's review from today.   Objective:   Physical Exam:  There were no vitals taken for this visit.  Physical Exam Constitutional:      General: She is not in acute distress. Musculoskeletal:        General: No deformity.  Neurological:     Mental Status: She is alert and oriented to person, place, and time.     Coordination:  Coordination normal.  Psychiatric:        Attention and Perception: Attention and perception normal. She does not perceive auditory or visual hallucinations.        Mood and Affect: Mood normal. Mood is not anxious or depressed. Affect is not labile, blunt, angry or inappropriate.        Speech: Speech normal.        Behavior: Behavior normal.        Thought Content: Thought content normal. Thought content is not paranoid or delusional. Thought content does not include homicidal or suicidal ideation. Thought content does not include homicidal or suicidal plan.        Cognition and Memory: Cognition and memory normal.        Judgment: Judgment normal.     Comments: Insight intact     Lab Review:     Component Value Date/Time   NA 140 12/14/2019 1018   NA 138 06/08/2019 0000   K 4.5 12/14/2019 1018   CL 106 12/14/2019 1018   CO2 26 12/14/2019 1018   GLUCOSE 94 12/14/2019 1018   BUN 31 (H) 12/14/2019 1018   BUN 29 (A) 06/08/2019 0000   CREATININE 2.02 (H) 12/14/2019 1018   CALCIUM 9.6 12/14/2019 1018   PROT 7.0 12/03/2019 1105   ALBUMIN 4.0 09/10/2019 0851   AST 14 12/03/2019 1105   ALT 13 12/03/2019 1105   ALKPHOS 125 09/10/2019 0851   BILITOT 1.0 12/03/2019 1105   GFRNONAA 22 (L) 09/13/2019 1243   GFRAA 25 (L) 09/13/2019 1243       Component Value Date/Time   WBC 13.0 (H) 09/08/2019 1347   RBC 4.95 09/08/2019 1347   HGB 14.4 09/08/2019 1347   HCT 46.0 09/08/2019 1347   PLT 220 09/08/2019 1347   MCV 92.9 09/08/2019 1347   MCH 29.1 09/08/2019 1347   MCHC 31.3 09/08/2019 1347   RDW 13.1 09/08/2019 1347   LYMPHSABS 2.5 04/27/2019 0544   MONOABS 0.5 04/27/2019 0544   EOSABS 0.2 04/27/2019 0544   BASOSABS 0.0 04/27/2019 0544    Lithium Lvl  Date Value Ref  Range Status  12/14/2019 0.5 (L) 0.6 - 1.2 mmol/L Final     No results found for: PHENYTOIN, PHENOBARB, VALPROATE, CBMZ   .res Assessment: Plan:    Plan:  Lorazepam 108m BID for anxiety - make take one tablet  extra for severe anxiety symptoms. Ingrezza 458mdaily   Hydroxyzine 101mID for anxiety Lamictal 200m63m hs Lithium 300mg74mly   Consider Seroquel for sleep - 25mg 67mto 2 at hs.  Labs - lithium level, TSH, CMP January  Tapering off Keppra 250mg B5mor seizures  Therapist - [name] - seeing every 2 weeks   RTC 4 weeks  Counseled patient regarding potential benefits, risks, and side effects of Lamictal to include potential risk of Stevens-Johnson syndrome. Advised patient to stop taking Lamictal and contact office immediately if rash develops and to seek urgent medical attention if rash is severe and/or spreading quickly.  Discussed potential benefits, risk, and side effects of benzodiazepines to include potential risk of tolerance and dependence, as well as possible drowsiness.  Advised patient not to drive if experiencing drowsiness and to take lowest possible effective dose to minimize risk of dependence and tolerance.    Diagnoses and all orders for this visit:  PTSD (post-traumatic stress disorder)  Bipolar I disorder (HCC)  Insomnia, unspecified type  Major depressive disorder, recurrent episode, moderate (HCC)  GMaroaralized anxiety disorder     Please see After Visit Summary for patient specific instructions.  Future Appointments  Date Time Provider DepartmToast/2021  1:00 PM Cottle, Bambi GLucious GrovesLBBH-[facility] None  02/09/2020  1:00 PM Cottle, Bambi G, LCSW [facility] None  02/23/2020  1:00 PM Cottle, Bambi G, LCSW [facility] None  02/24/2020  1:00 PM Shayley Medlin Berdie Ogren-CP None  03/28/2020 11:30 AM Andy, CLeamon ArntPC-HPC PEC  06/20/2020  2:00 PM SheffieWarren DanesCD-GSO CDGSO  08/01/2020  4:00 PM Aquino,Cameron SprangN-LBNG None    No orders of the defined types were placed in this encounter.   -------------------------------

## 2020-01-26 ENCOUNTER — Telehealth: Payer: Self-pay | Admitting: Adult Health

## 2020-01-26 ENCOUNTER — Ambulatory Visit: Payer: Medicare Other | Admitting: Psychology

## 2020-01-26 NOTE — Telephone Encounter (Signed)
Murline Holston called to inform you that when she took the Seroquil in the past it didn't help her any. She wants to let you know that she doesn't need you to call in the Seroquel for her now.

## 2020-01-26 NOTE — Telephone Encounter (Signed)
Noted  

## 2020-01-26 NOTE — Procedures (Signed)
ELECTROENCEPHALOGRAM REPORT  Dates of Recording: 01/12/2020 9:36AM to 01/13/2020 9:47AM  Patient's Name: Tiffany [name] MRN: [id number] Date of Birth: 24-Mar-1950  Referring Provider: Dr. Ellouise Newer  Procedure: 24-hour ambulatory video EEG  History: This is a 69 year old woman who had an episode of confusion with metabolic encephalopathy, EEG at that time showed left temporoparietal sharp waves. No further similar symptoms, she is on low dose Levetiracetam and would like to wean off medication.   Medications:  Levetiracetam Lamotrigine Hydroxyzine Norvasc Lithium Ativan  Technical Summary: This is a 24-hour multichannel digital video EEG recording measured by the international 10-20 system with electrodes applied with paste and impedances below 5000 ohms performed as portable with EKG monitoring.  The digital EEG was referentially recorded, reformatted, and digitally filtered in a variety of bipolar and referential montages for optimal display.    DESCRIPTION OF RECORDING: During maximal wakefulness, the background activity consisted of a symmetric 8 Hz posterior dominant rhythm which was reactive to eye opening.  There were no epileptiform discharges or focal slowing seen in wakefulness.  During the recording, the patient progresses through wakefulness, drowsiness, and Stage 2 sleep.  Again, there were no epileptiform discharges seen.  Events: There were no push button events.   There were no electrographic seizures seen.  EKG lead was unremarkable.  IMPRESSION: This 24-hour ambulatory video EEG study is normal.    CLINICAL CORRELATION: A normal EEG does not exclude a clinical diagnosis of epilepsy.  If further clinical questions remain, inpatient video EEG monitoring may be helpful.   Ellouise Newer, M.D.

## 2020-01-27 ENCOUNTER — Telehealth: Payer: Self-pay | Admitting: Adult Health

## 2020-01-27 ENCOUNTER — Ambulatory Visit (INDEPENDENT_AMBULATORY_CARE_PROVIDER_SITE_OTHER): Payer: Medicare Other | Admitting: Psychology

## 2020-01-27 DIAGNOSIS — F314 Bipolar disorder, current episode depressed, severe, without psychotic features: Secondary | ICD-10-CM | POA: Diagnosis not present

## 2020-01-27 NOTE — Telephone Encounter (Signed)
Please review

## 2020-01-27 NOTE — Telephone Encounter (Signed)
Pt called to report Seroquel caused CD in the past. Will it be safe to take now for sleep. Contact # [phone]. Pharmacy is [facility]

## 2020-01-27 NOTE — Telephone Encounter (Signed)
Will call patient to discuss

## 2020-01-28 ENCOUNTER — Telehealth: Payer: Self-pay

## 2020-01-28 NOTE — Telephone Encounter (Signed)
Noted thanks °

## 2020-01-28 NOTE — Telephone Encounter (Signed)
Ok with me 

## 2020-01-28 NOTE — Telephone Encounter (Signed)
Pt would like to transfer care from Dr. Jonni Sanger to [facility].

## 2020-01-31 ENCOUNTER — Other Ambulatory Visit: Payer: Self-pay

## 2020-01-31 ENCOUNTER — Encounter: Payer: Self-pay | Admitting: Physician Assistant

## 2020-01-31 ENCOUNTER — Ambulatory Visit (INDEPENDENT_AMBULATORY_CARE_PROVIDER_SITE_OTHER): Payer: Medicare Other | Admitting: Physician Assistant

## 2020-01-31 VITALS — BP 120/72 | HR 80 | Temp 98.3°F | Ht 64.0 in | Wt 221.0 lb

## 2020-01-31 DIAGNOSIS — E039 Hypothyroidism, unspecified: Secondary | ICD-10-CM | POA: Diagnosis not present

## 2020-01-31 DIAGNOSIS — R21 Rash and other nonspecific skin eruption: Secondary | ICD-10-CM

## 2020-01-31 DIAGNOSIS — I10 Essential (primary) hypertension: Secondary | ICD-10-CM

## 2020-01-31 MED ORDER — FLUCONAZOLE 150 MG PO TABS
ORAL_TABLET | ORAL | 0 refills | Status: DC
Start: 2020-01-31 — End: 2020-02-11

## 2020-01-31 MED ORDER — NYSTATIN 100000 UNIT/GM EX OINT: TOPICAL_OINTMENT | CUTANEOUS | 0 refills | Status: DC

## 2020-01-31 NOTE — Patient Instructions (Signed)
It was great to see you!  Nystatin ointment twice daily.  May take oral diflucan once, and then repeat in 1 week if symptoms.  Let me know if you do not have improvement.  Let's follow-up in 3 months, sooner if you have concerns.  Take care,  Inda Coke PA-C   Intertrigo Intertrigo is skin irritation (inflammation) that happens in warm, moist areas of the body. The irritation can cause a rash and make skin raw and itchy. The rash is usually pink or red. It happens mostly between folds of skin or where skin rubs together, such as:  Between the toes.  In the armpits.  In the groin area.  Under the belly.  Under the breasts.  Around the butt area. This condition is not passed from person to person (is not contagious). What are the causes?  Heat, moisture, rubbing, and not enough air movement.  The condition can be made worse by: ? Sweat. ? Bacteria. ? A fungus, such as yeast. What increases the risk?  Moisture in your skin folds.  You are more likely to develop this condition if you: ? Have diabetes. ? Are overweight. ? Are not able to move around. ? Live in a warm and moist climate. ? Wear splints, braces, or other medical devices. ? Are not able to control your pee (urine) or poop (stool). What are the signs or symptoms?  A pink or red skin rash in the skin fold or near the skin fold.  Raw or scaly skin.  Itching.  A burning feeling.  Bleeding.  Leaking fluid.  A bad smell. How is this treated?  Cleaning and drying your skin.  Taking an antibiotic medicine or using an antibiotic skin cream for a bacterial infection.  Using an antifungal cream on your skin or taking pills for an infection that was caused by a fungus, such as yeast.  Using a steroid ointment to stop the itching and irritation.  Separating the skin fold with a clean cotton cloth to absorb moisture and allow air to flow into the area. Follow these instructions at  home:  Keep the affected area clean and dry.  Do not scratch your skin.  Stay cool as much as you can. Use an air conditioner or a fan, if you have one.  Apply over-the-counter and prescription medicines only as told by your doctor.  If you were prescribed an antibiotic medicine, use it as told by your doctor. Do not stop using the antibiotic even if your condition starts to get better.  Keep all follow-up visits as told by your doctor. This is important. How is this prevented?   Stay at a healthy weight.  Take care of your feet. This is very important if you have diabetes. You should: ? Wear shoes that fit well. ? Keep your feet dry. ? Wear clean cotton or wool socks.  Protect the skin in your groin and butt area as told by your doctor. To do this: ? Follow a regular cleaning routine. ? Use creams, powders, or ointments that protect your skin. ? Change protection pads often.  Do not wear tight clothes. Wear clothes that: ? Are loose. ? Take moisture away from your body. ? Are made of cotton.  Wear a bra that gives good support, if needed.  Shower and dry yourself well after being active. Use a hair dryer on a cool setting to dry between skin folds.  Keep your blood sugar under control if you have diabetes.  Contact a doctor if:  Your symptoms do not get better with treatment.  Your symptoms get worse or they spread.  You notice more redness and warmth.  You have a fever. Summary  Intertrigo is skin irritation that occurs when folds of skin rub together.  This condition is caused by heat, moisture, and rubbing.  This condition may be treated by cleaning and drying your skin and with medicines.  Apply over-the-counter and prescription medicines only as told by your doctor.  Keep all follow-up visits as told by your doctor. This is important. This information is not intended to replace advice given to you by your health care provider. Make sure you discuss any  questions you have with your health care provider. Document Revised: 12/04/2017 Document Reviewed: 12/04/2017 Elsevier Patient Education  2020 [employer].

## 2020-01-31 NOTE — Progress Notes (Signed)
Tiffany [name] is a 69 y.o. female here for a new problem.  I acted as a Education administrator for [employer], PA-C Tiffany Pickler, LPN   History of Present Illness:   Chief Complaint  Patient presents with  . Rash    HPI    Rash Pt c/o red itchy rash right groin area x 1 month. Pt tried Bacitracin few weeks ago did not help. Overall symptoms have slightly improved with time. Denies: pain, fever, chills, purulent discharge.  HTN Currently taking Norvasc 2.5 mg. At home blood pressure readings are: normal. Patient denies chest pain, SOB, blurred vision, dizziness, unusual headaches, lower leg swelling. Patient is compliant with medication. Denies excessive caffeine intake, stimulant usage, excessive alcohol intake, or increase in salt consumption.  BP Readings from Last 3 Encounters:  01/31/20 120/72  12/14/19 128/78  10/08/19 (!) 134/76   Hypothyroidism Currently taking 112 mcg levothyroxine daily, tolerating well. Denies changes in weight, skin, stools.   Past Medical History:  Diagnosis Date  . Bipolar 1 disorder ([place])   . Breast cancer ([place])   . Chronic diarrhea    loose stools twice a day on average for years  . Chronic kidney disease (CKD), stage III (moderate) (HCC)   . Depression 1987  . Malignant neoplasm of overlapping sites of left breast in female, estrogen receptor positive ([place]) 03/14/2016   Dx in 09/2014, s/p bilateral mastectomies and ALND, 0/10 LN. 1.4 cm  Grade I invasive lobular, ER and PR +/Her--, Ki 67 <5% Tried anastrozole for one month, but developed suicidal idea  . Osteoarthritis, knee 09/24/2019   Xray 09/2019  . Parkinson's disease ([place]) 2012  . Psoriatic arthritis ([place])   . PTSD (post-traumatic stress disorder)   . Secondary hyperparathyroidism ([place])   . Tardive dyskinesia      Social History   Tobacco Use  . Smoking status: Never Smoker  . Smokeless tobacco: Never Used  Vaping Use  . Vaping Use: Never used  Substance Use Topics  . Alcohol use:  Never  . Drug use: Never    Past Surgical History:  Procedure Laterality Date  . ABDOMINAL HYSTERECTOMY  1987  . CHOLECYSTECTOMY  1979  . DILATION AND CURETTAGE OF UTERUS  1973  . MASTECTOMY Bilateral 09/19/2014  . TONSILLECTOMY  1970  . URETERAL REIMPLANTION Bilateral 1974    Family History  Problem Relation Age of Onset  . Cancer Mother   . Cancer Sister   . Stroke Maternal Grandfather   . Diabetes Paternal Grandfather   . Cancer Sister     Allergies  Allergen Reactions  . Aripiprazole Other (See Comments)    Parkinsonism     . Lactose Intolerance (Gi) Diarrhea  . Methotrexate Other (See Comments)    Hair loss, severe stomatitis    . Cefdinir Diarrhea    Other reaction(s): Diarrhea Yeast infection and fever; negative c diff  . Etanercept Other (See Comments)    Headaches    . Exemestane Other (See Comments)    Suicidal thoughts with medication    . Fluoxetine Other (See Comments)    Parkinsonism  . Methylprednisolone Sodium Succ Other (See Comments)    Agitated mania  . Epinephrine Palpitations    tachycardia   . Nitrofurantoin Nausea And Vomiting and Rash         Current Medications:   Current Outpatient Medications:  .  amLODipine (NORVASC) 2.5 MG tablet, Take 1 tablet (2.5 mg total) by mouth daily., Disp: 30 tablet, Rfl: 1 .  calcitRIOL (ROCALTROL) 0.25 MCG capsule, Take 1 capsule (0.25 mcg total) by mouth every Monday, Wednesday, and Friday., Disp: 12 capsule, Rfl: 0 .  Certolizumab Pegol (CIMZIA) 2 X 200 MG KIT, , Disp: , Rfl:  .  hydrOXYzine (ATARAX/VISTARIL) 10 MG tablet, Take 1 tablet (10 mg total) by mouth 3 (three) times daily as needed for anxiety., Disp: 60 tablet, Rfl: 1 .  lamoTRIgine (LAMICTAL) 200 MG tablet, Take 1 tablet (200 mg total) by mouth at bedtime., Disp: 90 tablet, Rfl: 3 .  levETIRAcetam (KEPPRA) 250 MG tablet, Take 1 tablet (250 mg total) by mouth 2 (two) times daily., Disp: 60 tablet, Rfl: 1 .  levothyroxine (SYNTHROID)  112 MCG tablet, Take 1 tablet (112 mcg total) by mouth daily at 6 (six) AM., Disp: 30 tablet, Rfl: 1 .  lithium carbonate 300 MG capsule, Take 1 capsule (300 mg total) by mouth at bedtime., Disp: 30 capsule, Rfl: 2 .  LORazepam (ATIVAN) 1 MG tablet, Take 1 tablet (1 mg total) by mouth 2 (two) times daily., Disp: 60 tablet, Rfl: 2 .  sodium bicarbonate 650 MG tablet, Take 1 tablet (650 mg total) by mouth 2 (two) times daily., Disp: 60 tablet, Rfl: 0 .  fluconazole (DIFLUCAN) 150 MG tablet, Take one tablet and then repeat in one week if symptoms persist, Disp: 2 tablet, Rfl: 0 .  nystatin ointment (MYCOSTATIN), Apply to affected area 1-2 times daily, Disp: 30 g, Rfl: 0   Review of Systems:   ROS  Negative unless otherwise specified per HPI.  Vitals:   Vitals:   01/31/20 0735  BP: 120/72  Pulse: 80  Temp: 98.3 F (36.8 C)  TempSrc: Temporal  SpO2: 94%  Weight: 221 lb (100.2 kg)  Height: 5' 4"  (1.626 m)     Body mass index is 37.93 kg/m.  Physical Exam:   Physical Exam Vitals and nursing note reviewed.  Constitutional:      General: She is not in acute distress.    Appearance: She is well-developed. She is not ill-appearing or toxic-appearing.  Cardiovascular:     Rate and Rhythm: Normal rate and regular rhythm.     Pulses: Normal pulses.     Heart sounds: Normal heart sounds, S1 normal and S2 normal.     Comments: No LE edema Pulmonary:     Effort: Pulmonary effort is normal.     Breath sounds: Normal breath sounds.  Skin:    General: Skin is warm and dry.     Comments: Macerated erythematous skin under pannus and in R inguinal area  Neurological:     Mental Status: She is alert.     GCS: GCS eye subscore is 4. GCS verbal subscore is 5. GCS motor subscore is 6.  Psychiatric:        Speech: Speech normal.        Behavior: Behavior normal. Behavior is cooperative.       Assessment and Plan:   Tiffany [name] was seen today for rash.  Diagnoses and all orders for this  visit:  Rash and nonspecific skin eruption Suspect intertrigo. Trial topical nystatin and oral diflucan. Follow-up if symptoms do not improve. She does have upcoming derm appointment scheduled.  Essential hypertension Well controlled presently. Continue Norvasc 2.5 mg daily. Follow-up in 3 months.  Acquired hypothyroidism Well controlled. Continue levothyroxine 112 mcg daily. Follow-up in 3 months.  Other orders -     fluconazole (DIFLUCAN) 150 MG tablet; Take one tablet and then repeat in one week  if symptoms persist -     nystatin ointment (MYCOSTATIN); Apply to affected area 1-2 times daily  CMA or LPN served as scribe during this visit. History, Physical, and Plan performed by medical provider. The above documentation has been reviewed and is accurate and complete.   Inda Coke, PA-C

## 2020-02-02 ENCOUNTER — Ambulatory Visit: Payer: Medicare Other | Admitting: Psychology

## 2020-02-07 ENCOUNTER — Encounter: Payer: Self-pay | Admitting: Physician Assistant

## 2020-02-09 ENCOUNTER — Ambulatory Visit (INDEPENDENT_AMBULATORY_CARE_PROVIDER_SITE_OTHER): Payer: Medicare Other | Admitting: Psychology

## 2020-02-09 DIAGNOSIS — F314 Bipolar disorder, current episode depressed, severe, without psychotic features: Secondary | ICD-10-CM

## 2020-02-11 ENCOUNTER — Ambulatory Visit (INDEPENDENT_AMBULATORY_CARE_PROVIDER_SITE_OTHER)
Admission: RE | Admit: 2020-02-11 | Discharge: 2020-02-11 | Disposition: A | Discharge status: Home / Self Care | Payer: Medicare Other | Source: Ambulatory Visit | Attending: Physician Assistant | Admitting: Physician Assistant

## 2020-02-11 ENCOUNTER — Ambulatory Visit (INDEPENDENT_AMBULATORY_CARE_PROVIDER_SITE_OTHER): Payer: Medicare Other | Admitting: Physician Assistant

## 2020-02-11 ENCOUNTER — Encounter: Payer: Self-pay | Admitting: Physician Assistant

## 2020-02-11 ENCOUNTER — Other Ambulatory Visit: Payer: Self-pay

## 2020-02-11 VITALS — BP 120/64 | HR 71 | Temp 98.7°F | Wt 220.6 lb

## 2020-02-11 DIAGNOSIS — M545 Low back pain, unspecified: Secondary | ICD-10-CM | POA: Diagnosis not present

## 2020-02-11 DIAGNOSIS — M25559 Pain in unspecified hip: Secondary | ICD-10-CM | POA: Diagnosis not present

## 2020-02-11 MED ORDER — SODIUM BICARBONATE 650 MG PO TABS: 650.0000 mg | ORAL_TABLET | Freq: Once | ORAL | 1 refills | Status: AC

## 2020-02-11 NOTE — Progress Notes (Signed)
Tiffany [name] is a 69 y.o. female here for a new problem.   History of Present Illness:   Chief Complaint  Patient presents with  . Hip Pain    left hip, radiating down left leg    HPI   L hip pain Intermittent. Started a few months ago. Radiates down her left leg. This pain significantly worsens when she is on her feet for long periods of time. This was most recent event was when she spent a lot of time barefoot in her kitchen while cooking for Thanksgiving. Tylenol is not helping. Difficulty sleeping due to pain. Last DEXA was normal, in 2020. Even at its worst doesn't last all day. No prior trauma to her left hip that she is aware of.  Denies: fevers, chills, numbness/tingling down leg, saddle anesthesia, new onset bowel/bladder incontinence   Past Medical History:  Diagnosis Date  . Bipolar 1 disorder ([place])   . Breast cancer ([place])   . Chronic diarrhea    loose stools twice a day on average for years  . Chronic kidney disease (CKD), stage III (moderate) (HCC)   . Depression 1987  . 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 1.4 cm  Grade I invasive lobular, ER and PR +/Her--, Ki 67 <5% Tried anastrozole for one month, but developed suicidal idea  . Osteoarthritis, knee 09/24/2019   Xray 09/2019  . Parkinson's disease ([place]) 2012  . Psoriatic arthritis ([place])   . PTSD (post-traumatic stress disorder)   . Secondary hyperparathyroidism ([place])   . Tardive dyskinesia      Social History   Tobacco Use  . Smoking status: Never Smoker  . Smokeless tobacco: Never Used  Vaping Use  . Vaping Use: Never used  Substance Use Topics  . Alcohol use: Never  . Drug use: Never    Past Surgical History:  Procedure Laterality Date  . ABDOMINAL HYSTERECTOMY  1987  . CHOLECYSTECTOMY  1979  . DILATION AND CURETTAGE OF UTERUS  1973  . MASTECTOMY Bilateral  09/19/2014  . TONSILLECTOMY  1970  . URETERAL REIMPLANTION Bilateral 1974    Family History  Problem Relation Age of Onset  . Cancer Mother   . Cancer Sister   . Stroke Maternal Grandfather   . Diabetes Paternal Grandfather   . Cancer Sister     Allergies  Allergen Reactions  . Aripiprazole Other (See Comments)    Parkinsonism     . Lactose Intolerance (Gi) Diarrhea  . Methotrexate Other (See Comments)    Hair loss, severe stomatitis    . Cefdinir Diarrhea    Other reaction(s): Diarrhea Yeast infection and fever; negative c diff  . Etanercept Other (See Comments)    Headaches    . Exemestane Other (See Comments)    Suicidal thoughts with medication    . Fluoxetine Other (See Comments)    Parkinsonism  . Methylprednisolone Sodium Succ Other (See Comments)    Agitated mania  . Epinephrine Palpitations    tachycardia   . Nitrofurantoin Nausea And Vomiting and Rash         Current Medications:   Current Outpatient Medications:  .  amLODipine (NORVASC) 2.5 MG tablet, Take 1 tablet (2.5 mg total) by mouth daily., Disp: 30 tablet, Rfl: 1 .  calcitRIOL (ROCALTROL) 0.25 MCG capsule, Take 1 capsule (0.25 mcg total) by mouth every Monday, Wednesday, and Friday., Disp:  12 capsule, Rfl: 0 .  Certolizumab Pegol (CIMZIA) 2 X 200 MG KIT, , Disp: , Rfl:  .  hydrOXYzine (ATARAX/VISTARIL) 10 MG tablet, Take 1 tablet (10 mg total) by mouth 3 (three) times daily as needed for anxiety., Disp: 60 tablet, Rfl: 1 .  lamoTRIgine (LAMICTAL) 200 MG tablet, Take 1 tablet (200 mg total) by mouth at bedtime., Disp: 90 tablet, Rfl: 3 .  levothyroxine (SYNTHROID) 112 MCG tablet, Take 1 tablet (112 mcg total) by mouth daily at 6 (six) AM., Disp: 30 tablet, Rfl: 1 .  lithium carbonate 300 MG capsule, Take 1 capsule (300 mg total) by mouth at bedtime., Disp: 30 capsule, Rfl: 2 .  LORazepam (ATIVAN) 1 MG tablet, Take 1 tablet (1 mg total) by mouth 2 (two) times daily., Disp: 60 tablet, Rfl: 2 .   nystatin ointment (MYCOSTATIN), Apply to affected area 1-2 times daily, Disp: 30 g, Rfl: 0 .  sodium bicarbonate 650 MG tablet, Take 1 tablet (650 mg total) by mouth once for 1 dose., Disp: 30 tablet, Rfl: 1   Review of Systems:   ROS  Negative unless otherwise specified per HPI.   Vitals:   Vitals:   02/11/20 0929  BP: 120/64  Pulse: 71  Temp: 98.7 F (37.1 C)  TempSrc: Temporal  SpO2: 98%  Weight: 220 lb 9.6 oz (100.1 kg)     Body mass index is 37.87 kg/m.  Physical Exam:   Physical Exam Vitals and nursing note reviewed.  Constitutional:      General: She is not in acute distress.    Appearance: She is well-developed. She is not ill-appearing or toxic-appearing.  Cardiovascular:     Rate and Rhythm: Normal rate and regular rhythm.     Pulses: Normal pulses.     Heart sounds: Normal heart sounds, S1 normal and S2 normal.     Comments: No LE edema Pulmonary:     Effort: Pulmonary effort is normal.     Breath sounds: Normal breath sounds.  Musculoskeletal:     Comments: No decreased ROM 2/2 pain with flexion/extension, lateral side bends, or rotation. Reproducible tenderness with deep palpation to bilateral paraspinal muscles. No bony tenderness.  TTP to lateral left hip  Skin:    General: Skin is warm and dry.  Neurological:     Mental Status: She is alert.     GCS: GCS eye subscore is 4. GCS verbal subscore is 5. GCS motor subscore is 6.  Psychiatric:        Speech: Speech normal.        Behavior: Behavior normal. Behavior is cooperative.     Assessment and Plan:   Tiffany [name] was seen today for hip pain.  Diagnoses and all orders for this visit:  Hip pain; Lumbar pain Does have significant bony tenderness to hip on exam. Will order xray for further evaluation of symptoms. She declines medication at this time.  Trial PT, referral placed. Worsening precautions advised. -     Ambulatory referral to Physical Therapy -     DG Lumbar Spine Complete; Future -      Ambulatory referral to Physical Therapy -     DG HIP UNILAT W OR W/O PELVIS 2-3 VIEWS LEFT; Future  Other orders -     sodium bicarbonate 650 MG tablet; Take 1 tablet (650 mg total) by mouth once for 1 dose.   Inda Coke, PA-C

## 2020-02-11 NOTE — Patient Instructions (Addendum)
It was great to see you!  You can schedule a physical therapy evaluation on your way out.  An order for an xray has been put in for you. To get your xray, you can walk in at the [facility] location without a scheduled appointment.  The address is [address]. It is across the street from [facility] is located in the basement.  Hours of operation are M-F 8:30am to 5:00pm. Please note that they are closed for lunch between 12:30 and 1:00pm.  Contact a health care provider if:  You have pain that is not relieved with rest or medicine.  You have increasing pain going down into your legs or buttocks.  Your pain does not improve after 2 weeks.  You have pain at night.  You lose weight without trying.  You have a fever or chills. Get help right away if:  You develop new bowel or bladder control problems.  You have unusual weakness or numbness in your arms or legs.  You develop nausea or vomiting.  You develop abdominal pain.  You feel faint.  Take care,  Inda Coke PA-C

## 2020-02-16 ENCOUNTER — Ambulatory Visit: Payer: Medicare Other | Admitting: Psychology

## 2020-02-23 ENCOUNTER — Ambulatory Visit (INDEPENDENT_AMBULATORY_CARE_PROVIDER_SITE_OTHER): Payer: Medicare Other | Admitting: Psychology

## 2020-02-23 DIAGNOSIS — F314 Bipolar disorder, current episode depressed, severe, without psychotic features: Secondary | ICD-10-CM | POA: Diagnosis not present

## 2020-02-24 ENCOUNTER — Other Ambulatory Visit: Payer: Self-pay

## 2020-02-24 ENCOUNTER — Encounter: Payer: Self-pay | Admitting: Adult Health

## 2020-02-24 ENCOUNTER — Ambulatory Visit (INDEPENDENT_AMBULATORY_CARE_PROVIDER_SITE_OTHER): Payer: Medicare Other | Admitting: Adult Health

## 2020-02-24 DIAGNOSIS — F319 Bipolar disorder, unspecified: Secondary | ICD-10-CM | POA: Diagnosis not present

## 2020-02-24 DIAGNOSIS — F331 Major depressive disorder, recurrent, moderate: Secondary | ICD-10-CM

## 2020-02-24 DIAGNOSIS — Z79899 Other long term (current) drug therapy: Secondary | ICD-10-CM | POA: Diagnosis not present

## 2020-02-24 DIAGNOSIS — F411 Generalized anxiety disorder: Secondary | ICD-10-CM | POA: Diagnosis not present

## 2020-02-24 DIAGNOSIS — G4700 Insomnia, unspecified: Secondary | ICD-10-CM

## 2020-02-24 DIAGNOSIS — F4310 Post-traumatic stress disorder, unspecified: Secondary | ICD-10-CM

## 2020-02-24 NOTE — Progress Notes (Signed)
Tiffany [name] [id number] [birth year] 69 y.o.  Subjective:   Patient ID:  Tiffany [name] is a 69 y.o. (DOB 11/19/1950) female.  Chief Complaint: No chief complaint on file.   HPI Tiffany [name] presents to the office today for follow-up of PTSD, insomnia, GAD, BPD 1.  Describes mood today as "ok". Pleasant. Tearful at times. Mood symptoms - reports decreased depression, anxiety, and irritability. Some "ups and downs". Stating "things are going good today". Has "agitated mania" - feels like it is related to her need to eat. Stating "I blow up". Feels like she needs to eat every few powers. Has an alarm set to eat every 2 hours to keep herself stable. Seeing therapist - Administrator. Varying interest and motivation. Taking medications as prescribed. Energy levels decreased. Active, unable to exercise with current physical disabilities.  Enjoys some usual interests and activities. Married. Lives with husband of 61 years and their daughter. Talking to family and friends.  Appetite stable. Weight stable. Sleeping better at nights. Averages 7 hours.  Focus and concentration is "pretty good". Completing tasks - driving more. Managing some aspects of household.   Denies SI or HI.  Denies AH or VH.   Previous medications: Celexa, Zyprexa, Tegretol, Depakote, Serzone, Topamax, Seroquel, Effexor, Lexapro, Desipramine, Neurontin, Abilify, Geodon, Propanolol, Cymbalta, Cogentin, Trihexyphenadyl, Sinmmet, Provigil, Selegiline, Requip, Amantadine, Prozac, Mirapex, Azilect, Metoclopramide, Baclofen, Artane, Namenda, Latuda.    GAD-7   Flowsheet Row Office Visit from 05/14/2019 in [facility]  Total GAD-7 Score [place] Office Visit from 06/07/2019 in [facility]  Total Score (max 30 points ) 29    PHQ2-9   [place] Visit from 09/16/2019 in [facility] Visit from 08/24/2019 in [facility] Visit from 05/14/2019 in [facility]  PHQ-2 Total Score 0 0 1  PHQ-9 Total Score -- 4 8       Review of Systems:  Review of Systems  Musculoskeletal: Negative for gait problem.  Neurological: Negative for tremors.  Psychiatric/Behavioral:       Please refer to HPI    Medications: I have reviewed the patient's current medications.  Current Outpatient Medications  Medication Sig Dispense Refill  . amLODipine (NORVASC) 2.5 MG tablet Take 1 tablet (2.5 mg total) by mouth daily. 30 tablet 1  . calcitRIOL (ROCALTROL) 0.25 MCG capsule Take 1 capsule (0.25 mcg total) by mouth every Monday, Wednesday, and Friday. 12 capsule 0  . Certolizumab Pegol (CIMZIA) 2 X 200 MG KIT     . hydrOXYzine (ATARAX/VISTARIL) 10 MG tablet Take 1 tablet (10 mg total) by mouth 3 (three) times daily as needed for anxiety. 60 tablet 1  . lamoTRIgine (LAMICTAL) 200 MG tablet Take 1 tablet (200 mg total) by mouth at bedtime. 90 tablet 3  . levothyroxine (SYNTHROID) 112 MCG tablet Take 1 tablet (112 mcg total) by mouth daily at 6 (six) AM. 30 tablet 1  . lithium carbonate 300 MG capsule Take 1 capsule (300 mg total) by mouth at bedtime. 30 capsule 2  . LORazepam (ATIVAN) 1 MG tablet Take 1 tablet (1 mg total) by mouth 2 (two) times daily. 60 tablet 2  . nystatin ointment (MYCOSTATIN) Apply to affected area 1-2 times daily 30 g 0   No current facility-administered medications for this visit.    Medication Side Effects: None  Allergies:  Allergies  Allergen Reactions  . Aripiprazole Other (  See Comments)    Parkinsonism     . Lactose Intolerance (Gi) Diarrhea  . Methotrexate Other (See Comments)    Hair loss, severe stomatitis    . Cefdinir Diarrhea    Other reaction(s): Diarrhea Yeast infection and fever; negative c diff  . Etanercept Other (See Comments)    Headaches    . Exemestane Other (See Comments)    Suicidal thoughts with medication     . Fluoxetine Other (See Comments)    Parkinsonism  . Methylprednisolone Sodium Succ Other (See Comments)    Agitated mania  . Epinephrine Palpitations    tachycardia   . Nitrofurantoin Nausea And Vomiting and Rash         Past Medical History:  Diagnosis Date  . Bipolar 1 disorder ([place])   . Breast cancer ([place])   . Chronic diarrhea    loose stools twice a day on average for years  . Chronic kidney disease (CKD), stage III (moderate) (HCC)   . Depression 1987  . Malignant neoplasm of overlapping sites of left breast in female, estrogen receptor positive ([place]) 03/14/2016   Dx in 09/2014, s/p bilateral mastectomies and ALND, 0/10 LN. 1.4 cm  Grade I invasive lobular, ER and PR +/Her--, Ki 67 <5% Tried anastrozole for one month, but developed suicidal idea  . Osteoarthritis, knee 09/24/2019   Xray 09/2019  . Parkinson's disease ([place]) 2012  . Psoriatic arthritis ([place])   . PTSD (post-traumatic stress disorder)   . Secondary hyperparathyroidism ([place])   . Tardive dyskinesia     Family History  Problem Relation Age of Onset  . Cancer Mother   . Cancer Sister   . Stroke Maternal Grandfather   . Diabetes Paternal Grandfather   . Cancer Sister     Social History   Socioeconomic History  . Marital status: Married    Spouse name: Not on file  . Number of children: Not on file  . Years of education: Not on file  . Highest education level: Not on file  Occupational History  . Not on file  Tobacco Use  . Smoking status: Never Smoker  . Smokeless tobacco: Never Used  Vaping Use  . Vaping Use: Never used  Substance and Sexual Activity  . Alcohol use: Never  . Drug use: Never  . Sexual activity: Not Currently  Other Topics Concern  . Not on file  Social History Narrative   Moved to area from [place] 08/2018   Lives one story home   Right handed.   Social Determinants of Health   Financial Resource Strain: Not on file  Food Insecurity: Not on file  Transportation Needs:  Not on file  Physical Activity: Not on file  Stress: Not on file  Social Connections: Not on file  Intimate Partner Violence: Not on file    Past Medical History, Surgical history, Social history, and Family history were reviewed and updated as appropriate.   Please see review of systems for further details on the patient's review from today.   Objective:   Physical Exam:  There were no vitals taken for this visit.  Physical Exam Constitutional:      General: She is not in acute distress. Musculoskeletal:        General: No deformity.  Neurological:     Mental Status: She is alert and oriented to person, place, and time.     Coordination: Coordination normal.  Psychiatric:        Attention and Perception: Attention and  perception normal. She does not perceive auditory or visual hallucinations.        Mood and Affect: Mood normal. Mood is not anxious or depressed. Affect is not labile, blunt, angry or inappropriate.        Speech: Speech normal.        Behavior: Behavior normal.        Thought Content: Thought content normal. Thought content is not paranoid or delusional. Thought content does not include homicidal or suicidal ideation. Thought content does not include homicidal or suicidal plan.        Cognition and Memory: Cognition and memory normal.        Judgment: Judgment normal.     Comments: Insight intact     Lab Review:     Component Value Date/Time   NA 140 12/14/2019 1018   NA 138 06/08/2019 0000   K 4.5 12/14/2019 1018   CL 106 12/14/2019 1018   CO2 26 12/14/2019 1018   GLUCOSE 94 12/14/2019 1018   BUN 31 (H) 12/14/2019 1018   BUN 29 (A) 06/08/2019 0000   CREATININE 2.02 (H) 12/14/2019 1018   CALCIUM 9.6 12/14/2019 1018   PROT 7.0 12/03/2019 1105   ALBUMIN 4.0 09/10/2019 0851   AST 14 12/03/2019 1105   ALT 13 12/03/2019 1105   ALKPHOS 125 09/10/2019 0851   BILITOT 1.0 12/03/2019 1105   GFRNONAA 22 (L) 09/13/2019 1243   GFRAA 25 (L) 09/13/2019 1243        Component Value Date/Time   WBC 13.0 (H) 09/08/2019 1347   RBC 4.95 09/08/2019 1347   HGB 14.4 09/08/2019 1347   HCT 46.0 09/08/2019 1347   PLT 220 09/08/2019 1347   MCV 92.9 09/08/2019 1347   MCH 29.1 09/08/2019 1347   MCHC 31.3 09/08/2019 1347   RDW 13.1 09/08/2019 1347   LYMPHSABS 2.5 04/27/2019 0544   MONOABS 0.5 04/27/2019 0544   EOSABS 0.2 04/27/2019 0544   BASOSABS 0.0 04/27/2019 0544    Lithium Lvl  Date Value Ref Range Status  12/14/2019 0.5 (L) 0.6 - 1.2 mmol/L Final     No results found for: PHENYTOIN, PHENOBARB, VALPROATE, CBMZ   .res Assessment: Plan:    Plan:  Lorazepam 71m BID for anxiety - make take one tablet extra for severe anxiety symptoms. Lamictal 2076mat hs Lithium 3004maily   Consider Seroquel for sleep - 26m12m1 to 2 at hs.  Labs - lithium level, TSH, CMP January  Therapist - [name] - seeing every 2 weeks  RTC 4 weeks  Counseled patient regarding potential benefits, risks, and side effects of Lamictal to include potential risk of Stevens-Johnson syndrome. Advised patient to stop taking Lamictal and contact office immediately if rash develops and to seek urgent medical attention if rash is severe and/or spreading quickly.  Discussed potential benefits, risk, and side effects of benzodiazepines to include potential risk of tolerance and dependence, as well as possible drowsiness.  Advised patient not to drive if experiencing drowsiness and to take lowest possible effective dose to minimize risk of dependence and tolerance.      Diagnoses and all orders for this visit:  High risk medication use -     PTH, Intact (ICMA) and Ionized Calcium -     Basic metabolic panel -     TSH -     Lithium level  Generalized anxiety disorder  PTSD (post-traumatic stress disorder)  Bipolar I disorder (HCC)  Insomnia, unspecified type  Major depressive  disorder, recurrent episode, moderate ([place])     Please see After Visit  Summary for patient specific instructions.  Future Appointments  Date Time Provider [place]  03/01/2020  1:00 PM Cottle, Lucious Groves, LCSW [facility] None  03/07/2020 11:00 AM Lyndee Hensen, PT [facility]  03/08/2020  1:00 PM Cottle, Bambi G, LCSW [facility] None  03/15/2020  1:00 PM Cottle, Bambi G, LCSW [facility] None  03/22/2020  1:00 PM Cottle, Bambi G, LCSW [facility] None  03/29/2020  1:00 PM Cottle, Bambi G, LCSW [facility] None  04/05/2020  1:00 PM Cottle, Bambi G, LCSW [facility] None  04/06/2020  1:00 PM Beverlie Kurihara, Berdie Ogren, NP [facility] None  05/02/2020  9:30 AM Inda Coke, PA [facility]  06/20/2020  2:00 PM Warren Danes, PA-C [facility]  08/01/2020  4:00 PM Cameron Sprang, MD [facility] None    Orders Placed This Encounter  Procedures  . PTH, Intact (ICMA) and Ionized Calcium  . Basic metabolic panel  . TSH  . Lithium level    -------------------------------

## 2020-03-01 ENCOUNTER — Other Ambulatory Visit: Payer: Self-pay | Admitting: Adult Health

## 2020-03-01 ENCOUNTER — Ambulatory Visit: Payer: Medicare Other | Admitting: Psychology

## 2020-03-07 ENCOUNTER — Ambulatory Visit (INDEPENDENT_AMBULATORY_CARE_PROVIDER_SITE_OTHER): Payer: Medicare Other | Admitting: Physical Therapy

## 2020-03-07 ENCOUNTER — Other Ambulatory Visit: Payer: Self-pay

## 2020-03-07 ENCOUNTER — Encounter: Payer: Self-pay | Admitting: Physical Therapy

## 2020-03-07 DIAGNOSIS — M25561 Pain in right knee: Secondary | ICD-10-CM | POA: Diagnosis not present

## 2020-03-07 DIAGNOSIS — M25552 Pain in left hip: Secondary | ICD-10-CM | POA: Diagnosis not present

## 2020-03-07 DIAGNOSIS — G8929 Other chronic pain: Secondary | ICD-10-CM

## 2020-03-07 DIAGNOSIS — M5442 Lumbago with sciatica, left side: Secondary | ICD-10-CM

## 2020-03-07 NOTE — Therapy (Signed)
[facility] [address] [place], [name], [address] Phone: [phone]   Fax:  [phone]  Physical Therapy Evaluation  Patient Details  Name: Tiffany [name] MRN: [id number] Date of Birth: 17-Sep-1950 Referring Provider (PT): Inda Coke   Encounter Date: 03/07/2020   PT End of Session - 03/07/20 1149    Visit Number 1    Number of Visits 12    Date for PT Re-Evaluation 05/02/20    Authorization Type Medicare  12/28 eval:  1 visit in 2021 here..    PT Start Time 1100    PT Stop Time 1135    PT Time Calculation (min) 35 min    Activity Tolerance Patient tolerated treatment well    Behavior During Therapy WFL for tasks assessed/performed           Past Medical History:  Diagnosis Date  . Bipolar 1 disorder ([place])   . Breast cancer ([place])   . Chronic diarrhea    loose stools twice a day on average for years  . Chronic kidney disease (CKD), stage III (moderate) (HCC)   . Depression 1987  . Malignant neoplasm of overlapping sites of left breast in female, estrogen receptor positive ([place]) 03/14/2016   Dx in 09/2014, s/p bilateral mastectomies and ALND, 0/10 LN. 1.4 cm  Grade I invasive lobular, ER and PR +/Her--, Ki 67 <5% Tried anastrozole for one month, but developed suicidal idea  . Osteoarthritis, knee 09/24/2019   Xray 09/2019  . Parkinson's disease ([place]) 2012  . Psoriatic arthritis ([place])   . PTSD (post-traumatic stress disorder)   . Secondary hyperparathyroidism ([place])   . Tardive dyskinesia     Past Surgical History:  Procedure Laterality Date  . ABDOMINAL HYSTERECTOMY  1987  . CHOLECYSTECTOMY  1979  . DILATION AND CURETTAGE OF UTERUS  1973  . MASTECTOMY Bilateral 09/19/2014  . TONSILLECTOMY  1970  . URETERAL REIMPLANTION Bilateral 1974    There were no vitals filed for this visit.    Subjective Assessment - 03/07/20 1146    Subjective Pt reports ongoing/worsening pain in L hip, for about 1 yr. Also has mild  pain in low back, states pain goes all the way down to foot laterally, as well as mild R kne epain/OA. Pt was hospitalized last year (january) and spent some time in rehab after this, but focus was more for mobility. States no recent falls. Husband present today at appt. Pt does Have 4 WW with her today, does not always use in house. Pt states not 100 % confident in her balance.    Limitations Standing;Walking;House hold activities    Currently in Pain? Yes    Pain Score 8     Pain Location Hip    Pain Orientation Left    Pain Descriptors / Indicators Aching;Burning;Radiating    Pain Type Chronic pain    Pain Onset More than a month ago    Pain Frequency Intermittent    Aggravating Factors  increased standing, walking, activity    Multiple Pain Sites Yes    Pain Score 8    Pain Location Back    Pain Orientation Left    Pain Descriptors / Indicators Aching;Radiating    Pain Type Chronic pain    Pain Onset More than a month ago    Pain Frequency Intermittent    Aggravating Factors  increased activity, standing, walking.              [facility] PT Assessment -  03/07/20 0001      Assessment   Medical Diagnosis L hip pain/ LBP    Referring Provider (PT) Inda Coke    Hand Dominance Right    Prior Therapy last year, for mobility      Precautions   Precautions Fall      Balance Screen   Has the patient fallen in the past 6 months No      Prior Function   Level of Independence Independent with household mobility with device      Cognition   Overall Cognitive Status Within Functional Limits for tasks assessed      AROM   Overall AROM Comments Lumbar: mod limitation for flex/ext;  Mild for SB and rot;   Hips: WFL;  R Knee: mild limitation for flex/ext from OA;      Strength   Overall Strength Comments Knees: 4+/5,  Hips: 4/5,      Palpation   Palpation comment Soreness at L gr troch, into ITB, Painful trigger points in L glute and piriformis,  tenderness in L SI and L low  lumbar      Ambulation/Gait   Gait Comments Dynamic balance decreased:  good, req use of AD, Will do formal balance testing in future.                      Objective measurements completed on examination: See above findings.       [facility] Adult PT Treatment/Exercise - 03/07/20 0001      Exercises   Exercises Lumbar      Lumbar Exercises: Stretches   Single Knee to Chest Stretch 3 reps;30 seconds    Piriformis Stretch 2 reps;30 seconds    Piriformis Stretch Limitations hip IR across body    Other Lumbar Stretch Exercise ITB with strap 30 sec x 2;                  PT Education - 03/07/20 1149    Education Details PT POC, Exam findings, HEP    Person(s) Educated Patient    Methods Explanation;Demonstration;Tactile cues;Verbal cues;Handout    Comprehension Verbalized understanding;Returned demonstration;Tactile cues required;Need further instruction;Verbal cues required            PT Short Term Goals - 03/07/20 1202      PT SHORT TERM GOAL #1   Title Pt to be independent with initial HEP    Time 2    Period Weeks    Status New    Target Date 03/21/20      PT SHORT TERM GOAL #2   Title Pt to report decreased pain in L hip to 0-5/ 10 with activity    Time 3    Period Weeks    Status New    Target Date 03/28/20             PT Long Term Goals - 03/07/20 1203      PT LONG TERM GOAL #1   Title Pt to be independent with final HEP for hip, back, knee and mobility    Time 8    Period Weeks    Status New    Target Date 05/02/20      PT LONG TERM GOAL #2   Title Pt to report decreased pain in L low back, hip , and LE to 0-3/10 with activity    Time 8    Period Weeks    Status New    Target Date 05/02/20  PT LONG TERM GOAL #3   Title Pt to demo improved hip and quad strength to at least 4+/5 to improve stability and pain    Time 8    Period Weeks    Status New    Target Date 05/02/20      PT LONG TERM GOAL #4   Title Pt to demo  independent/safe ability for ambulation with LRAD, for at least 500 ft, to improve safety with home and community navigatoin, with balance to be [facility] for pt age and dx.    Time 8    Period Weeks    Status New    Target Date 05/02/20                  Plan - 03/07/20 1154    Clinical Impression Statement Pt presents with primary complaint of increased pain in L hip and back. Pt with decreased lumbar ROM, and increased pain with activity. Pt with tenderness in lateral hip consistent with bursitis type pain, but also has pain in LE that is consistent with lumbar radiculopathy. Pt with painful trigger points in L glute with palpation today. She also has pain and ROM limitations in R knee, consistent with OA. Pt does have mild balance deficits and overall decreased mobility since hopsitalization last year. Pt will benefit from skilled PT to improve deficits and pain.    Personal Factors and Comorbidities Comorbidity 1;Fitness    Comorbidities multiple pain locations, back, hip, knee, Decreased mobility post covid and hospitalization last year,    Examination-Activity Limitations Stand;Locomotion Level;Bend;Dressing;Squat;Stairs    Examination-Participation Restrictions Cleaning;Meal Prep;Yard Work;Community Activity;Shop;Laundry    Stability/Clinical Decision Making Evolving/Moderate complexity    Clinical Decision Making Moderate    Rehab Potential Good    PT Frequency 2x / week    PT Duration 8 weeks    PT Treatment/Interventions ADLs/Self Care Home Management;Cryotherapy;Electrical Stimulation;Ultrasound;Traction;Moist Heat;Iontophoresis 4mg /ml Dexamethasone;DME Instruction;Gait training;Stair training;Functional mobility training;Therapeutic activities;Therapeutic exercise;Orthotic Fit/Training;Patient/family education;Neuromuscular re-education;Balance training;Manual techniques;Taping;Dry needling;Passive range of motion;Spinal Manipulations;Joint Manipulations    Consulted and Agree with  Plan of Care Patient           Patient will benefit from skilled therapeutic intervention in order to improve the following deficits and impairments:  Abnormal gait,Pain,Increased muscle spasms,Decreased activity tolerance,Decreased endurance,Decreased range of motion,Decreased strength,Impaired flexibility,Difficulty walking,Decreased balance,Decreased safety awareness  Visit Diagnosis: Pain in left hip  Chronic left-sided low back pain with left-sided sciatica  Chronic pain of right knee     Problem List Patient Active Problem List   Diagnosis Date Noted  . Osteoarthritis, knee 09/24/2019  . CKD (chronic kidney disease) stage 4, GFR 15-29 ml/min (HCC) 09/24/2019  . Severe recurrent major depression without psychotic features ([place]) 09/09/2019  . Bipolar I disorder, most recent episode depressed ([place])   . Transaminitis   . Essential hypertension   . Encephalopathy 04/26/2019  . Leukocytosis 04/21/2019  . Thrombocytopenia ([place]) 09/04/2018  . Vitamin D deficiency 09/04/2018  . Psoriatic arthritis ([place]) 09/04/2018  . Tubular adenoma of colon 05/28/2017  . History of colonic polyps 05/28/2017  . 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 Malignant neoplasm of overlapping sites of left breast in female, estrogen receptor positive ([place]) 03/14/2016  . Chronic kidney disease, stage III (moderate) 01/19/2016  . Tardive dyskinesia 10/18/2015  . History of breast cancer left 2016 12/27/2014  . Osteopenia determined by x-ray 10/31/2014  . Rosacea 05/21/2007  . Bipolar affective disorder, mixed ([place]) 08/16/2004  . Acquired hypothyroidism 04/03/1996  Lyndee Hensen, PT, DPT 1:27 PM  03/07/20    [facility] [place], [name], [address] Phone: [phone]   Fax:  [phone]  Name: Jillene Wehrenberg MRN: [id number] Date of Birth: 10-08-1950

## 2020-03-07 NOTE — Patient Instructions (Signed)
Access Code: [email or web address] URL: [email or web address] Date: 03/07/2020 Prepared by: Lyndee Hensen  Exercises Supine Single Knee to Chest Stretch - 2 x daily - 3 reps - 30 hold Supine Piriformis Stretch with Leg Straight - 2 x daily - 3 reps - 30 hold Supine ITB Stretch with Strap - 2 x daily - 3 reps - 30 hold

## 2020-03-08 ENCOUNTER — Ambulatory Visit: Payer: Medicare Other | Admitting: Psychology

## 2020-03-13 ENCOUNTER — Other Ambulatory Visit: Payer: Self-pay

## 2020-03-13 ENCOUNTER — Encounter: Payer: Self-pay | Admitting: Physical Therapy

## 2020-03-13 ENCOUNTER — Ambulatory Visit (INDEPENDENT_AMBULATORY_CARE_PROVIDER_SITE_OTHER): Payer: Medicare Other | Admitting: Physical Therapy

## 2020-03-13 DIAGNOSIS — G8929 Other chronic pain: Secondary | ICD-10-CM

## 2020-03-13 DIAGNOSIS — M25561 Pain in right knee: Secondary | ICD-10-CM

## 2020-03-13 DIAGNOSIS — M25552 Pain in left hip: Secondary | ICD-10-CM | POA: Diagnosis not present

## 2020-03-13 DIAGNOSIS — M5442 Lumbago with sciatica, left side: Secondary | ICD-10-CM

## 2020-03-13 NOTE — Therapy (Signed)
[facility] [address] [place], [name], [address] Phone: [phone]   Fax:  [phone]  Physical Therapy Treatment  Patient Details  Name: Tiffany [name] MRN: [id number] Date of Birth: 1950-06-23 Referring Provider (PT): Inda Coke   Encounter Date: 03/13/2020   PT End of Session - 03/13/20 1425    Visit Number 2    Number of Visits 12    Date for PT Re-Evaluation 05/02/20    Authorization Type Medicare  12/28 eval:  1 visit in 2021 here..    PT Start Time 1310    PT Stop Time 1352    PT Time Calculation (min) 42 min    Activity Tolerance Patient tolerated treatment well    Behavior During Therapy WFL for tasks assessed/performed           Past Medical History:  Diagnosis Date  . Bipolar 1 disorder ([place])   . Breast cancer ([place])   . Chronic diarrhea    loose stools twice a day on average for years  . Chronic kidney disease (CKD), stage III (moderate) (HCC)   . Depression 1987  . Malignant neoplasm of overlapping sites of left breast in female, estrogen receptor positive ([place]) 03/14/2016   Dx in 09/2014, s/p bilateral mastectomies and ALND, 0/10 LN. 1.4 cm  Grade I invasive lobular, ER and PR +/Her--, Ki 67 <5% Tried anastrozole for one month, but developed suicidal idea  . Osteoarthritis, knee 09/24/2019   Xray 09/2019  . Parkinson's disease ([place]) 2012  . Psoriatic arthritis ([place])   . PTSD (post-traumatic stress disorder)   . Secondary hyperparathyroidism ([place])   . Tardive dyskinesia     Past Surgical History:  Procedure Laterality Date  . ABDOMINAL HYSTERECTOMY  1987  . CHOLECYSTECTOMY  1979  . DILATION AND CURETTAGE OF UTERUS  1973  . MASTECTOMY Bilateral 09/19/2014  . TONSILLECTOMY  1970  . URETERAL REIMPLANTION Bilateral 1974    There were no vitals filed for this visit.   Subjective Assessment - 03/13/20 1334    Subjective Pt states mild soreness, hip  pain comes and goes. Has been doing HEP.     Currently in Pain? Yes    Pain Score 4     Pain Location Hip    Pain Orientation Left    Pain Descriptors / Indicators Aching    Pain Type Chronic pain    Pain Onset More than a month ago    Pain Frequency Intermittent                             [facility] Adult PT Treatment/Exercise - 03/13/20 0001      Ambulation/Gait   Gait Comments Dynamic balance decreased:  good, req use of AD, Will do formal balance testing in future.      Exercises   Exercises Lumbar      Lumbar Exercises: Stretches   Active Hamstring Stretch 3 reps;30 seconds    Active Hamstring Stretch Limitations seated    Single Knee to Chest Stretch 3 reps;30 seconds    Piriformis Stretch 2 reps;30 seconds    Piriformis Stretch Limitations hip IR across body    Other Lumbar Stretch Exercise ITB with strap 30 sec x 2;      Lumbar Exercises: Seated   Long Arc Quad on Chair 15 reps    LAQ on Chair Weights (lbs) 2      Lumbar Exercises: Supine  Clam 20 reps    Clam Limitations GTB    Bent Knee Raise 20 reps    Straight Leg Raise 10 reps    Straight Leg Raises Limitations bil with TA    Other Supine Lumbar Exercises --      Lumbar Exercises: Sidelying   Clam --    Hip Abduction 10 reps;Both      Manual Therapy   Manual Therapy Joint mobilization;Soft tissue mobilization;Manual Traction    Joint Mobilization HIp inf mobs gr 3 , L    Soft tissue mobilization STM/DTM and IASTM to L glute, gr troch and ITB    Manual Traction Long leg distraction on L , for hip pump x 2 min                    PT Short Term Goals - 03/07/20 1202      PT SHORT TERM GOAL #1   Title Pt to be independent with initial HEP    Time 2    Period Weeks    Status New    Target Date 03/21/20      PT SHORT TERM GOAL #2   Title Pt to report decreased pain in L hip to 0-5/ 10 with activity    Time 3    Period Weeks    Status New    Target Date 03/28/20             PT Long Term Goals - 03/07/20 1203       PT LONG TERM GOAL #1   Title Pt to be independent with final HEP for hip, back, knee and mobility    Time 8    Period Weeks    Status New    Target Date 05/02/20      PT LONG TERM GOAL #2   Title Pt to report decreased pain in L low back, hip , and LE to 0-3/10 with activity    Time 8    Period Weeks    Status New    Target Date 05/02/20      PT LONG TERM GOAL #3   Title Pt to demo improved hip and quad strength to at least 4+/5 to improve stability and pain    Time 8    Period Weeks    Status New    Target Date 05/02/20      PT LONG TERM GOAL #4   Title Pt to demo independent/safe ability for ambulation with LRAD, for at least 500 ft, to improve safety with home and community navigatoin, with balance to be [facility] for pt age and dx.    Time 8    Period Weeks    Status New    Target Date 05/02/20                 Plan - 03/13/20 1427    Clinical Impression Statement Pt with soreness in L gr troch, glute, and ITB with manual today. Ther ex reviewed for HEP, added light hip strengthening. Plan to progress as tolerated.    Personal Factors and Comorbidities Comorbidity 1;Fitness    Comorbidities multiple pain locations, back, hip, knee, Decreased mobility post covid and hospitalization last year,    Examination-Activity Limitations Stand;Locomotion Level;Bend;Dressing;Squat;Stairs    Examination-Participation Restrictions Cleaning;Meal Prep;Yard Work;Community Activity;Shop;Laundry    Stability/Clinical Decision Making Evolving/Moderate complexity    Rehab Potential Good    PT Frequency 2x / week    PT Duration 8 weeks  PT Treatment/Interventions ADLs/Self Care Home Management;Cryotherapy;Electrical Stimulation;Ultrasound;Traction;Moist Heat;Iontophoresis 4mg /ml Dexamethasone;DME Instruction;Gait training;Stair training;Functional mobility training;Therapeutic activities;Therapeutic exercise;Orthotic Fit/Training;Patient/family education;Neuromuscular  re-education;Balance training;Manual techniques;Taping;Dry needling;Passive range of motion;Spinal Manipulations;Joint Manipulations    Consulted and Agree with Plan of Care Patient           Patient will benefit from skilled therapeutic intervention in order to improve the following deficits and impairments:  Abnormal gait,Pain,Increased muscle spasms,Decreased activity tolerance,Decreased endurance,Decreased range of motion,Decreased strength,Impaired flexibility,Difficulty walking,Decreased balance,Decreased safety awareness  Visit Diagnosis: Pain in left hip  Chronic left-sided low back pain with left-sided sciatica  Chronic pain of right knee     Problem List Patient Active Problem List   Diagnosis Date Noted  . Osteoarthritis, knee 09/24/2019  . CKD (chronic kidney disease) stage 4, GFR 15-29 ml/min (HCC) 09/24/2019  . Severe recurrent major depression without psychotic features ([place]) 09/09/2019  . Bipolar I disorder, most recent episode depressed ([place])   . Transaminitis   . Essential hypertension   . Encephalopathy 04/26/2019  . Leukocytosis 04/21/2019  . Thrombocytopenia ([place]) 09/04/2018  . Vitamin D deficiency 09/04/2018  . Psoriatic arthritis ([place]) 09/04/2018  . Tubular adenoma of colon 05/28/2017  . History of colonic polyps 05/28/2017  . 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 Malignant neoplasm of overlapping sites of left breast in female, estrogen receptor positive ([place]) 03/14/2016  . Chronic kidney disease, stage III (moderate) 01/19/2016  . Tardive dyskinesia 10/18/2015  . History of breast cancer left 2016 12/27/2014  . Osteopenia determined by x-ray 10/31/2014  . Rosacea 05/21/2007  . Bipolar affective disorder, mixed ([place]) 08/16/2004  . Acquired hypothyroidism 04/03/1996   Lyndee Hensen, PT, DPT 2:29 PM  03/13/20    [facility] [place], [name],  [address] Phone: [phone]   Fax:  [phone]  Name: Rosetta Rupnow MRN: [id number] Date of Birth: [birth year]

## 2020-03-15 ENCOUNTER — Ambulatory Visit: Payer: Medicare Other | Admitting: Psychology

## 2020-03-15 ENCOUNTER — Encounter: Payer: Medicare Other | Admitting: Physical Therapy

## 2020-03-16 ENCOUNTER — Ambulatory Visit (INDEPENDENT_AMBULATORY_CARE_PROVIDER_SITE_OTHER): Payer: Medicare Other

## 2020-03-16 DIAGNOSIS — Z0000 Encounter for general adult medical examination without abnormal findings: Secondary | ICD-10-CM | POA: Diagnosis not present

## 2020-03-16 NOTE — Progress Notes (Signed)
Virtual Visit via Telephone Note  I connected with  Tiffany [name] on 03/16/20 at  2:30 PM EST by telephone and verified that I am speaking with the correct person using two identifiers.  Location: Patient: Home Provider: Office Persons participating in the virtual visit: patient/Nurse Health Advisor and Husband [name]   I discussed the limitations, risks, security and privacy concerns of performing an evaluation and management service by telephone and the availability of in person appointments. The patient expressed understanding and agreed to proceed.  Interactive audio and video telecommunications were attempted between this nurse and patient, however failed, due to patient having technical difficulties OR patient did not have access to video capability.  We continued and completed visit with audio only.  Some vital signs may be absent or patient reported.   Willette Brace, LPN    Subjective:   Tiffany [name] is a 70 y.o. female who presents for Medicare Annual (Subsequent) preventive examination.  Review of Systems     Cardiac Risk Factors include: advanced age (>41men, >14 women);hypertension;obesity (BMI >30kg/m2)     Objective:    Today's Vitals   03/16/20 1425  PainSc: 7    There is no height or weight on file to calculate BMI.  Advanced Directives 03/16/2020 03/07/2020 12/14/2019 09/08/2019 09/05/2019 06/07/2019 06/07/2019  Does Patient Have a Medical Advance Directive? Yes No Yes No No Yes -  Type of Paramedic of [place];Living will - [place];Living will;Out of facility DNR (pink MOST or yellow form) - - Press photographer;Living will [place];Living will  Does patient want to make changes to medical advance directive? - - - - - - -  Copy of [place] in Chart? No - copy requested - - - - - -  Would patient like information on creating a medical advance directive? - No -  Patient declined - No - Patient declined No - Patient declined - -  Some encounter information is confidential and restricted. Go to Review Flowsheets activity to see all data.    Current Medications (verified) Outpatient Encounter Medications as of 03/16/2020  Medication Sig  . amLODipine (NORVASC) 2.5 MG tablet Take 1 tablet (2.5 mg total) by mouth daily.  . calcitRIOL (ROCALTROL) 0.25 MCG capsule Take 1 capsule (0.25 mcg total) by mouth every Monday, Wednesday, and Friday.  . Certolizumab Pegol (CIMZIA) 2 X 200 MG KIT   . lamoTRIgine (LAMICTAL) 200 MG tablet Take 1 tablet (200 mg total) by mouth at bedtime.  Marland Kitchen levothyroxine (SYNTHROID) 112 MCG tablet Take 1 tablet (112 mcg total) by mouth daily at 6 (six) AM.  . lithium carbonate 300 MG capsule TAKE 1 CAPSULE(300 MG) BY MOUTH AT BEDTIME  . LORazepam (ATIVAN) 1 MG tablet Take 1 tablet (1 mg total) by mouth 2 (two) times daily.  Marland Kitchen nystatin ointment (MYCOSTATIN) Apply to affected area 1-2 times daily  . hydrOXYzine (ATARAX/VISTARIL) 10 MG tablet Take 1 tablet (10 mg total) by mouth 3 (three) times daily as needed for anxiety. (Patient not taking: Reported on 03/16/2020)   No facility-administered encounter medications on file as of 03/16/2020.    Allergies (verified) Aripiprazole, Lactose intolerance (gi), Methotrexate, Cefdinir, Etanercept, Exemestane, Fluoxetine, Methylprednisolone sodium succ, Epinephrine, and Nitrofurantoin   History: Past Medical History:  Diagnosis Date  . Bipolar 1 disorder ([place])   . Breast cancer ([place])   . Chronic diarrhea    loose stools twice a day on average for  years  . Chronic kidney disease (CKD), stage III (moderate) ([place])   . Depression 1987  . Hospitalization or health care facility admission within last 6 months 04/2019   for fall/seizures  . Malignant neoplasm of overlapping sites of left breast in female, estrogen receptor positive ([place]) 03/14/2016   Dx in 09/2014, s/p bilateral mastectomies and ALND,  0/10 LN. 1.4 cm  Grade I invasive lobular, ER and PR +/Her--, Ki 67 <5% Tried anastrozole for one month, but developed suicidal idea  . Osteoarthritis, knee 09/24/2019   Xray 09/2019  . Parkinson's disease ([place]) 2012  . Psoriatic arthritis ([place])   . PTSD (post-traumatic stress disorder)   . Secondary hyperparathyroidism ([place])   . Tardive dyskinesia    Past Surgical History:  Procedure Laterality Date  . ABDOMINAL HYSTERECTOMY  1987  . CHOLECYSTECTOMY  1979  . DILATION AND CURETTAGE OF UTERUS  1973  . MASTECTOMY Bilateral 09/19/2014  . TONSILLECTOMY  1970  . URETERAL REIMPLANTION Bilateral 1974   Family History  Problem Relation Age of Onset  . Cancer Mother   . Cancer Sister   . Stroke Maternal Grandfather   . Diabetes Paternal Grandfather   . Cancer Sister    Social History   Socioeconomic History  . Marital status: Married    Spouse name: Not on file  . Number of children: Not on file  . Years of education: Not on file  . Highest education level: Not on file  Occupational History  . Occupation: retired  Tobacco Use  . Smoking status: Never Smoker  . Smokeless tobacco: Never Used  Vaping Use  . Vaping Use: Never used  Substance and Sexual Activity  . Alcohol use: Never  . Drug use: Never  . Sexual activity: Not Currently  Other Topics Concern  . Not on file  Social History Narrative   Moved to area from [place] 08/2018   Lives one story home   Right handed.   Social Determinants of Health   Financial Resource Strain: Low Risk   . Difficulty of Paying Living Expenses: Not hard at all  Food Insecurity: No Food Insecurity  . Worried About Charity fundraiser in the Last Year: Never true  . Ran Out of Food in the Last Year: Never true  Transportation Needs: No Transportation Needs  . Lack of Transportation (Medical): No  . Lack of Transportation (Non-Medical): No  Physical Activity: Inactive  . Days of Exercise per Week: 0 days  . Minutes of Exercise per  Session: 0 min  Stress: Stress Concern Present  . Feeling of Stress : To some extent  Social Connections: Moderately Integrated  . Frequency of Communication with Friends and Family: More than three times a week  . Frequency of Social Gatherings with Friends and Family: Three times a week  . Attends Religious Services: 1 to 4 times per year  . Active Member of Clubs or Organizations: No  . Attends Archivist Meetings: Never  . Marital Status: Married    Tobacco Counseling Counseling given: Not Answered   Clinical Intake:  Pre-visit preparation completed: Yes  Pain : 0-10 Pain Score: 7  Pain Type: Chronic pain Pain Location: Hip (hip ankle and lower back) Pain Orientation: Lower Pain Descriptors / Indicators: Aching,Throbbing,Sharp,Sore Pain Onset: More than a month ago Pain Frequency: Intermittent  Pain Score: 7 Pain Type: Chronic pain  BMI - recorded: 37.87 Nutritional Status: BMI > 30  Obese Nutritional Risks: None Diabetes: No  How often  do you need to have someone help you when you read instructions, pamphlets, or other written materials from your doctor or pharmacy?: 1 - Never  Diabetic?No  Interpreter Needed?: No  Information entered by :: Charlott Rakes, LPN   Activities of Daily Living In your present state of health, do you have any difficulty performing the following activities: 03/16/2020  Hearing? N  Vision? N  Difficulty concentrating or making decisions? Y  Comment memory at times  Walking or climbing stairs? N  Dressing or bathing? N  Doing errands, shopping? N  Preparing Food and eating ? N  Using the Toilet? N  In the past six months, have you accidently leaked urine? N  Do you have problems with loss of bowel control? N  Managing your Medications? Y  Comment HUSBAND ASSIST  Managing your Finances? N  Housekeeping or managing your Housekeeping? N  Some encounter information is confidential and restricted. Go to Review Flowsheets  activity to see all data.  Some recent data might be hidden    Patient Care Team: Inda Coke, [place] as PCP - General (Physician Assistant) [place], Berdie Ogren, NP as Nurse Practitioner (Psychiatry) [place], MD as Consulting Physician (Gastroenterology) Star Age, MD as Consulting Physician (Neurology) Rhe[facility]ology, [facility] as Consulting Physician (Rheumatology) Cameron Sprang, MD as Consulting Physician (Neurology) Associates, [facility] as Consulting Physician (Nephrology) Cameron Sprang, MD as Consulting Physician (Neurology)  Indicate any recent Medical Services you may have received from other than [facility] providers in the past year (date may be approximate).     Assessment:   This is a routine wellness examination for [place].  Hearing/Vision screen  Hearing Screening   _0  _1  _2  _3  _4  _5  _6  _7  _8   Right ear:           Left ear:           Comments: Pt denies any hearing issues   Vision Screening Comments: Pt just moved here and is looking for referal  Dietary issues and exercise activities discussed: Current Exercise Habits: The patient does not participate in regular exercise at present, Exercise limited by: orthopedic condition(s)  Goals    . Patient Stated     Lose weight and get balance exercise       Depression Screen PHQ 2/9 Scores 03/16/2020 09/16/2019 08/24/2019 05/14/2019  PHQ - 2 Score 2 0 0 1  PHQ- 9 Score 3 - 4 8    Fall Risk Fall Risk  03/16/2020 12/14/2019 09/24/2019 06/25/2019 06/07/2019  Falls in the past year? 1 1 0 0 1  Number falls in past yr: _9 0 1  Injury with Fall? 0 0 0 0 1  Risk for fall due to : Impaired vision;Impaired mobility;Impaired balance/gait;History of fall(s) Impaired balance/gait;Impaired mobility - - History of fall(s);Impaired balance/gait;Impaired mobility  Follow up Falls prevention discussed - - - -    FALL RISK PREVENTION PERTAINING TO THE HOME:  Any stairs in  or around the home? Yes  If so, are there any without handrails? No  Home free of loose throw rugs in walkways, pet beds, electrical cords, etc? Yes  Adequate lighting in your home to reduce risk of falls? Yes   ASSISTIVE DEVICES UTILIZED TO PREVENT FALLS:  Life alert? No  Use of a cane, walker or w/c? Yes  Grab bars in the bathroom? Yes  Shower chair or bench in shower? Yes  Elevated toilet seat or a handicapped toilet? Yes   TIMED UP  AND GO:  Was the test performed? No .      Cognitive Function: MMSE - Mini Mental State Exam 06/07/2019  Orientation to time 4  Orientation to Place 5  Registration 3  Attention/ Calculation 5  Recall 3  Language- name 2 objects 2  Language- repeat 1  Language- follow 3 step command 3  Language- read & follow direction 1  Write a sentence 1  Copy design 1  Total score 29     6CIT Screen 03/16/2020  What Year? 0 points  What month? 0 points  Count back from 20 0 points  Months in reverse 0 points  Repeat phrase 0 points    Immunizations Immunization History  Administered Date(s) Administered  . Influenza Inj Mdck Quad Pf 01/05/2019  . Influenza, High Dose Seasonal PF 12/13/2014, 01/22/2016, 11/25/2016, 11/18/2017  . Influenza, Seasonal, Injecte, Preservative Fre 01/22/2016  . Influenza-Unspecified 12/10/2019  . PFIZER SARS-COV-2 Vaccination 07/09/2019, 08/02/2019, 11/09/2019  . PPD Test 02/27/2016  . Pneumococcal Conjugate-13 12/13/2014  . Pneumococcal Polysaccharide-23 02/07/2016  . Tdap 05/26/2015    TDAP status: Up to date  Flu Vaccine status: Up to date  Done 12/10/19 Pneumococcal vaccine status: Up to date  Covid-19 vaccine status: Completed vaccines  Qualifies for Shingles Vaccine? Yes   Zostavax completed No   Shingrix Completed?: No.    Education has been provided regarding the importance of this vaccine. Patient has been advised to call insurance company to determine out of pocket expense if they have not yet  received this vaccine. Advised may also receive vaccine at local pharmacy or Health Dept. Verbalized acceptance and understanding.  Screening Tests Health Maintenance  Topic Date Due  . COVID-19 Vaccine (4 - Booster for Jet series) 05/08/2020  . DEXA SCAN  08/25/2021  . TETANUS/TDAP  05/25/2025  . COLONOSCOPY (Pts 45-67yrs Insurance coverage will need to be confirmed)  03/12/2027  . INFLUENZA VACCINE  Completed  . PNA vac Low Risk Adult  Completed  . MAMMOGRAM  Discontinued  . Hepatitis C Screening  Discontinued    Health Maintenance  There are no preventive care reminders to display for this patient.  Colorectal cancer screening: Type of screening: Colonoscopy. Completed 03/11/17. Repeat every 10 years  Mammogram status: No longer required due to per pt.  Bone Density status: Completed 08/26/19. Results reflect: Bone density results: OSTEOPENIA. Repeat every 2 years.   Additional Screening:  Hepatitis C Screening: Discontinued  Vision Screening: Recommended annual ophthalmology exams for early detection of glaucoma and other disorders of the eye. Is the patient up to date with their annual eye exam?  No  Who is the provider or what is the name of the office in which the patient attends annual eye exams? Pt is requesting a referral    Dental Screening: Recommended annual dental exams for proper oral hygiene  Community Resource Referral / Chronic Care Management: CRR required this visit?  No   CCM required this visit?  No      Plan:     I have personally reviewed and noted the following in the patient's chart:   . Medical and social history . Use of alcohol, tobacco or illicit drugs  . Current medications and supplements . Functional ability and status . Nutritional status . Physical activity . Advanced directives . List of other physicians . Hospitalizations, surgeries, and ER visits in previous 12 months . Vitals . Screenings to include cognitive,  depression, and falls . Referrals and appointments  In addition,  I have reviewed and discussed with patient certain preventive protocols, quality metrics, and best practice recommendations. A written personalized care plan for preventive services as well as general preventive health recommendations were provided to patient.     Willette Brace, LPN   11/09/9800   Nurse Notes: None

## 2020-03-16 NOTE — Patient Instructions (Addendum)
[name] , Thank you for taking time to come for your Medicare Wellness Visit. I appreciate your ongoing commitment to your health goals. Please review the following plan we discussed and let me know if I can assist you in the future.   Screening recommendations/referrals: Colonoscopy: Done 03/11/17 Bone Density: Done 08/26/19 Recommended yearly ophthalmology/optometry visit for glaucoma screening and checkup Recommended yearly dental visit for hygiene and checkup  Vaccinations: Influenza vaccine: Done 12/10/19 Pneumococcal vaccine: Up to date Tdap vaccine: Up to date Shingles vaccine: Shingrix discussed. Please contact your pharmacy for coverage information.    Covid-19:Completed 4/30, 5/24 & 11/09/19  Advanced directives: Please bring a copy of your health care power of attorney and living will to the office at your convenience.  Conditions/risks identified: Get balance back with exercise and lose weight   Next appointment: Follow up in one year for your annual wellness visit    Preventive Care 65 Years and Older, Female Preventive care refers to lifestyle choices and visits with your health care provider that can promote health and wellness. What does preventive care include?  A yearly physical exam. This is also called an annual well check.  Dental exams once or twice a year.  Routine eye exams. Ask your health care provider how often you should have your eyes checked.  Personal lifestyle choices, including:  Daily care of your teeth and gums.  Regular physical activity.  Eating a healthy diet.  Avoiding tobacco and drug use.  Limiting alcohol use.  Practicing safe sex.  Taking low-dose aspirin every day.  Taking vitamin and mineral supplements as recommended by your health care provider. What happens during an annual well check? The services and screenings done by your health care provider during your annual well check will depend on your age, overall health,  lifestyle risk factors, and family history of disease. Counseling  Your health care provider may ask you questions about your:  Alcohol use.  Tobacco use.  Drug use.  Emotional well-being.  Home and relationship well-being.  Sexual activity.  Eating habits.  History of falls.  Memory and ability to understand (cognition).  Work and work Statistician.  Reproductive health. Screening  You may have the following tests or measurements:  Height, weight, and BMI.  Blood pressure.  Lipid and cholesterol levels. These may be checked every 5 years, or more frequently if you are over 48 years old.  Skin check.  Lung cancer screening. You may have this screening every year starting at age 46 if you have a 30-pack-year history of smoking and currently smoke or have quit within the past 15 years.  Fecal occult blood test (FOBT) of the stool. You may have this test every year starting at age 60.  Flexible sigmoidoscopy or colonoscopy. You may have a sigmoidoscopy every 5 years or a colonoscopy every 10 years starting at age 44.  Hepatitis C blood test.  Hepatitis B blood test.  Sexually transmitted disease (STD) testing.  Diabetes screening. This is done by checking your blood sugar (glucose) after you have not eaten for a while (fasting). You may have this done every 1-3 years.  Bone density scan. This is done to screen for osteoporosis. You may have this done starting at age 15.  Mammogram. This may be done every 1-2 years. Talk to your health care provider about how often you should have regular mammograms. Talk with your health care provider about your test results, treatment options, and if necessary, the need for more  tests. Vaccines  Your health care provider may recommend certain vaccines, such as:  Influenza vaccine. This is recommended every year.  Tetanus, diphtheria, and acellular pertussis (Tdap, Td) vaccine. You may need a Td booster every 10 years.  Zoster  vaccine. You may need this after age 82.  Pneumococcal 13-valent conjugate (PCV13) vaccine. One dose is recommended after age 26.  Pneumococcal polysaccharide (PPSV23) vaccine. One dose is recommended after age 35. Talk to your health care provider about which screenings and vaccines you need and how often you need them. This information is not intended to replace advice given to you by your health care provider. Make sure you discuss any questions you have with your health care provider. Document Released: 03/24/2015 Document Revised: 11/15/2015 Document Reviewed: 12/27/2014 Elsevier Interactive Patient Education  2017 [place] Prevention in the Home Falls can cause injuries. They can happen to people of all ages. There are many things you can do to make your home safe and to help prevent falls. What can I do on the outside of my home?  Regularly fix the edges of walkways and driveways and fix any cracks.  Remove anything that might make you trip as you walk through a door, such as a raised step or threshold.  Trim any bushes or trees on the path to your home.  Use bright outdoor lighting.  Clear any walking paths of anything that might make someone trip, such as rocks or tools.  Regularly check to see if handrails are loose or broken. Make sure that both sides of any steps have handrails.  Any raised decks and porches should have guardrails on the edges.  Have any leaves, snow, or ice cleared regularly.  Use sand or salt on walking paths during winter.  Clean up any spills in your garage right away. This includes oil or grease spills. What can I do in the bathroom?  Use night lights.  Install grab bars by the toilet and in the tub and shower. Do not use towel bars as grab bars.  Use non-skid mats or decals in the tub or shower.  If you need to sit down in the shower, use a plastic, non-slip stool.  Keep the floor dry. Clean up any water that spills on the  floor as soon as it happens.  Remove soap buildup in the tub or shower regularly.  Attach bath mats securely with double-sided non-slip rug tape.  Do not have throw rugs and other things on the floor that can make you trip. What can I do in the bedroom?  Use night lights.  Make sure that you have a light by your bed that is easy to reach.  Do not use any sheets or blankets that are too big for your bed. They should not hang down onto the floor.  Have a firm chair that has side arms. You can use this for support while you get dressed.  Do not have throw rugs and other things on the floor that can make you trip. What can I do in the kitchen?  Clean up any spills right away.  Avoid walking on wet floors.  Keep items that you use a lot in easy-to-reach places.  If you need to reach something above you, use a strong step stool that has a grab bar.  Keep electrical cords out of the way.  Do not use floor polish or wax that makes floors slippery. If you must use wax, use non-skid floor  wax.  Do not have throw rugs and other things on the floor that can make you trip. What can I do with my stairs?  Do not leave any items on the stairs.  Make sure that there are handrails on both sides of the stairs and use them. Fix handrails that are broken or loose. Make sure that handrails are as long as the stairways.  Check any carpeting to make sure that it is firmly attached to the stairs. Fix any carpet that is loose or worn.  Avoid having throw rugs at the top or bottom of the stairs. If you do have throw rugs, attach them to the floor with carpet tape.  Make sure that you have a light switch at the top of the stairs and the bottom of the stairs. If you do not have them, ask someone to add them for you. What else can I do to help prevent falls?  Wear shoes that:  Do not have high heels.  Have rubber bottoms.  Are comfortable and fit you well.  Are closed at the toe. Do not wear  sandals.  If you use a stepladder:  Make sure that it is fully opened. Do not climb a closed stepladder.  Make sure that both sides of the stepladder are locked into place.  Ask someone to hold it for you, if possible.  Clearly mark and make sure that you can see:  Any grab bars or handrails.  First and last steps.  Where the edge of each step is.  Use tools that help you move around (mobility aids) if they are needed. These include:  Canes.  Walkers.  Scooters.  Crutches.  Turn on the lights when you go into a dark area. Replace any light bulbs as soon as they burn out.  Set up your furniture so you have a clear path. Avoid moving your furniture around.  If any of your floors are uneven, fix them.  If there are any pets around you, be aware of where they are.  Review your medicines with your doctor. Some medicines can make you feel dizzy. This can increase your chance of falling. Ask your doctor what other things that you can do to help prevent falls. This information is not intended to replace advice given to you by your health care provider. Make sure you discuss any questions you have with your health care provider. Document Released: 12/22/2008 Document Revised: 08/03/2015 Document Reviewed: 04/01/2014 Elsevier Interactive Patient Education  2017 [employer].

## 2020-03-20 ENCOUNTER — Ambulatory Visit (INDEPENDENT_AMBULATORY_CARE_PROVIDER_SITE_OTHER): Payer: Medicare Other | Admitting: Physical Therapy

## 2020-03-20 ENCOUNTER — Other Ambulatory Visit: Payer: Self-pay

## 2020-03-20 ENCOUNTER — Encounter: Payer: Self-pay | Admitting: Physical Therapy

## 2020-03-20 DIAGNOSIS — G8929 Other chronic pain: Secondary | ICD-10-CM | POA: Diagnosis not present

## 2020-03-20 DIAGNOSIS — M25552 Pain in left hip: Secondary | ICD-10-CM | POA: Diagnosis not present

## 2020-03-20 DIAGNOSIS — M25561 Pain in right knee: Secondary | ICD-10-CM | POA: Diagnosis not present

## 2020-03-20 DIAGNOSIS — M5442 Lumbago with sciatica, left side: Secondary | ICD-10-CM | POA: Diagnosis not present

## 2020-03-20 NOTE — Therapy (Addendum)
[facility] [address] [place], [name], [address] Phone: [phone]   Fax:  [phone]  Physical Therapy Treatment  Patient Details  Name: Tiffany [name] MRN: [id number] Date of Birth: 01-18-1951 Referring Provider (PT): Inda Coke   Encounter Date: 03/20/2020   PT End of Session - 03/20/20 1401    Visit Number 3    Number of Visits 12    Date for PT Re-Evaluation 05/02/20    Authorization Type Medicare  12/28 eval:  1 visit in 2021 here..    PT Start Time 1105    PT Stop Time 1145    PT Time Calculation (min) 40 min    Activity Tolerance Patient tolerated treatment well    Behavior During Therapy WFL for tasks assessed/performed           Past Medical History:  Diagnosis Date  . Bipolar 1 disorder ([place])   . Breast cancer ([place])   . Chronic diarrhea    loose stools twice a day on average for years  . Chronic kidney disease (CKD), stage III (moderate) (HCC)   . Depression 1987  . Hospitalization or health care facility admission within last 6 months 04/2019   for fall/seizures  . Malignant neoplasm of overlapping sites of left breast in female, estrogen receptor positive ([place]) 03/14/2016   Dx in 09/2014, s/p bilateral mastectomies and ALND, 0/10 LN. 1.4 cm  Grade I invasive lobular, ER and PR +/Her--, Ki 67 <5% Tried anastrozole for one month, but developed suicidal idea  . Osteoarthritis, knee 09/24/2019   Xray 09/2019  . Parkinson's disease ([place]) 2012  . Psoriatic arthritis ([place])   . PTSD (post-traumatic stress disorder)   . Secondary hyperparathyroidism ([place])   . Tardive dyskinesia     Past Surgical History:  Procedure Laterality Date  . ABDOMINAL HYSTERECTOMY  1987  . CHOLECYSTECTOMY  1979  . DILATION AND CURETTAGE OF UTERUS  1973  . MASTECTOMY Bilateral 09/19/2014  . TONSILLECTOMY  1970  . URETERAL REIMPLANTION Bilateral 1974    There were no vitals filed for this visit.   Subjective Assessment -  03/20/20 1324    Subjective Pt states intermittent pain in hip, back, and into calf.    Currently in Pain? Yes    Pain Score 7     Pain Location Hip    Pain Orientation Lower    Pain Descriptors / Indicators Aching;Throbbing    Pain Type Chronic pain    Pain Onset More than a month ago    Pain Frequency Intermittent    Pain Score 7    Pain Location Back    Pain Orientation Left    Pain Descriptors / Indicators Aching;Radiating    Pain Type Chronic pain    Pain Onset More than a month ago    Pain Frequency Intermittent                             [facility] Adult PT Treatment/Exercise - 03/20/20 0001      Ambulation/Gait   Gait Comments Dynamic balance decreased:  good, req use of AD, Will do formal balance testing in future.      Exercises   Exercises Lumbar      Lumbar Exercises: Stretches   Active Hamstring Stretch 3 reps;30 seconds    Active Hamstring Stretch Limitations seated    Single Knee to Chest Stretch 3 reps;30 seconds    Piriformis Stretch 2  reps;30 seconds    Piriformis Stretch Limitations hip IR across body    Other Lumbar Stretch Exercise --      Lumbar Exercises: Seated   Long Arc Quad on Chair --    LAQ on Chair Weights (lbs) --      Lumbar Exercises: Supine   Clam 20 reps    Clam Limitations GTB    Bent Knee Raise 20 reps    Bridge 15 reps    Straight Leg Raise 10 reps    Straight Leg Raises Limitations bil with TA      Lumbar Exercises: Sidelying   Hip Abduction Both;15 reps      Manual Therapy   Manual Therapy Joint mobilization;Soft tissue mobilization;Manual Traction;Passive ROM    Soft tissue mobilization DTM and IASTM to L glute, and lateral hip.    Passive ROM manual piriformis stretch.    Manual Traction Long leg distraction on L , for hip pump x 2 min                    PT Short Term Goals - 03/07/20 1202      PT SHORT TERM GOAL #1   Title Pt to be independent with initial HEP    Time 2    Period Weeks     Status New    Target Date 03/21/20      PT SHORT TERM GOAL #2   Title Pt to report decreased pain in L hip to 0-5/ 10 with activity    Time 3    Period Weeks    Status New    Target Date 03/28/20             PT Long Term Goals - 03/07/20 1203      PT LONG TERM GOAL #1   Title Pt to be independent with final HEP for hip, back, knee and mobility    Time 8    Period Weeks    Status New    Target Date 05/02/20      PT LONG TERM GOAL #2   Title Pt to report decreased pain in L low back, hip , and LE to 0-3/10 with activity    Time 8    Period Weeks    Status New    Target Date 05/02/20      PT LONG TERM GOAL #3   Title Pt to demo improved hip and quad strength to at least 4+/5 to improve stability and pain    Time 8    Period Weeks    Status New    Target Date 05/02/20      PT LONG TERM GOAL #4   Title Pt to demo independent/safe ability for ambulation with LRAD, for at least 500 ft, to improve safety with home and community navigatoin, with balance to be [facility] for pt age and dx.    Time 8    Period Weeks    Status New    Target Date 05/02/20                 Plan - 03/20/20 1404    Clinical Impression Statement Pt with less soreness in L glute with manual today than previous visit. Pt with little soreness with ther ex and light strengthening. Pt to benefit from progressive strengthening as pain improves.    Personal Factors and Comorbidities Comorbidity 1;Fitness    Comorbidities multiple pain locations, back, hip, knee, Decreased mobility post covid and hospitalization last  year,    Examination-Activity Limitations Stand;Locomotion Level;Bend;Dressing;Squat;Stairs    Examination-Participation Restrictions Cleaning;Meal Prep;Yard Work;Community Activity;Shop;Laundry    Stability/Clinical Decision Making Evolving/Moderate complexity    Rehab Potential Good    PT Frequency 2x / week    PT Duration 8 weeks    PT Treatment/Interventions ADLs/Self Care Home  Management;Cryotherapy;Electrical Stimulation;Ultrasound;Traction;Moist Heat;Iontophoresis 58m/ml Dexamethasone;DME Instruction;Gait training;Stair training;Functional mobility training;Therapeutic activities;Therapeutic exercise;Orthotic Fit/Training;Patient/family education;Neuromuscular re-education;Balance training;Manual techniques;Taping;Dry needling;Passive range of motion;Spinal Manipulations;Joint Manipulations    Consulted and Agree with Plan of Care Patient           Patient will benefit from skilled therapeutic intervention in order to improve the following deficits and impairments:  Abnormal gait,Pain,Increased muscle spasms,Decreased activity tolerance,Decreased endurance,Decreased range of motion,Decreased strength,Impaired flexibility,Difficulty walking,Decreased balance,Decreased safety awareness  Visit Diagnosis: Pain in left hip  Chronic left-sided low back pain with left-sided sciatica  Chronic pain of right knee     Problem List Patient Active Problem List   Diagnosis Date Noted  . Osteoarthritis, knee 09/24/2019  . CKD (chronic kidney disease) stage 4, GFR 15-29 ml/min (HCC) 09/24/2019  . Severe recurrent major depression without psychotic features ([place] 09/09/2019  . Bipolar I disorder, most recent episode depressed ([place]   . Transaminitis   . Essential hypertension   . Encephalopathy 04/26/2019  . Leukocytosis 04/21/2019  . Thrombocytopenia ([place] 09/04/2018  . Vitamin D deficiency 09/04/2018  . Psoriatic arthritis ([place] 09/04/2018  . Tubular adenoma of colon 05/28/2017  . History of colonic polyps 05/28/2017  . 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 Malignant neoplasm of overlapping sites of left breast in female, estrogen receptor positive ([place] 03/14/2016  . Chronic kidney disease, stage III (moderate) 01/19/2016  . Tardive dyskinesia 10/18/2015  . History of breast cancer left 2016 12/27/2014  . Osteopenia  determined by x-ray 10/31/2014  . Rosacea 05/21/2007  . Bipolar affective disorder, mixed ([place] 08/16/2004  . Acquired hypothyroidism 04/03/1996    LLyndee Hensen PT, DPT 2:06 PM  03/20/20    [email or web address] [address]Phone: [phone]  Fax:  [phone] Name: NAniesha HaughnMRN: [id number] of Birth: 712-28-1952  PHYSICAL THERAPY DISCHARGE SUMMARY  Visits from Start of Care: 3 Plan: Patient agrees to discharge.  Patient goals were not met. Patient is being discharged due to not returning since the last visit.  ?????     LLyndee Hensen PT, DPT 3:59 PM  07/27/20

## 2020-03-22 ENCOUNTER — Ambulatory Visit: Payer: Medicare Other | Admitting: Psychology

## 2020-03-22 ENCOUNTER — Encounter: Payer: Medicare Other | Admitting: Physical Therapy

## 2020-03-22 LAB — TSH: TSH: 1.68 mIU/L (ref 0.40–4.50)

## 2020-03-22 LAB — BASIC METABOLIC PANEL
BUN/Creatinine Ratio: 14 (calc) (ref 6–22)
BUN: 27 mg/dL — ABNORMAL HIGH (ref 7–25)
CO2: 22 mmol/L (ref 20–32)
Calcium: 9.7 mg/dL (ref 8.6–10.4)
Chloride: 109 mmol/L (ref 98–110)
Creat: 1.99 mg/dL — ABNORMAL HIGH (ref 0.60–0.93)
Glucose, Bld: 92 mg/dL (ref 65–99)
Potassium: 4.5 mmol/L (ref 3.5–5.3)
Sodium: 140 mmol/L (ref 135–146)

## 2020-03-22 LAB — PTH, INTACT (ICMA) AND IONIZED CALCIUM
Calcium, Ion: 5.3 mg/dL (ref 4.8–5.6)
Calcium: 9.7 mg/dL (ref 8.6–10.4)
PTH: 79 pg/mL — ABNORMAL HIGH (ref 14–64)

## 2020-03-22 LAB — LITHIUM LEVEL: Lithium Lvl: 0.6 mmol/L (ref 0.6–1.2)

## 2020-03-23 ENCOUNTER — Encounter: Payer: Medicare Other | Admitting: Physical Therapy

## 2020-03-27 ENCOUNTER — Encounter: Payer: Medicare Other | Admitting: Physical Therapy

## 2020-03-28 ENCOUNTER — Ambulatory Visit: Payer: Medicare Other | Admitting: Adult Health

## 2020-03-28 ENCOUNTER — Ambulatory Visit: Payer: Medicare Other | Admitting: Family Medicine

## 2020-03-29 ENCOUNTER — Encounter: Payer: Medicare Other | Admitting: Physical Therapy

## 2020-03-29 ENCOUNTER — Ambulatory Visit: Payer: Medicare Other | Admitting: Psychology

## 2020-04-05 ENCOUNTER — Ambulatory Visit: Payer: Medicare Other | Admitting: Psychology

## 2020-04-06 ENCOUNTER — Encounter: Payer: Self-pay | Admitting: Adult Health

## 2020-04-06 ENCOUNTER — Ambulatory Visit (INDEPENDENT_AMBULATORY_CARE_PROVIDER_SITE_OTHER): Payer: Medicare Other | Admitting: Adult Health

## 2020-04-06 ENCOUNTER — Other Ambulatory Visit: Payer: Self-pay

## 2020-04-06 DIAGNOSIS — G4700 Insomnia, unspecified: Secondary | ICD-10-CM

## 2020-04-06 DIAGNOSIS — F319 Bipolar disorder, unspecified: Secondary | ICD-10-CM | POA: Diagnosis not present

## 2020-04-06 DIAGNOSIS — F411 Generalized anxiety disorder: Secondary | ICD-10-CM

## 2020-04-06 DIAGNOSIS — F331 Major depressive disorder, recurrent, moderate: Secondary | ICD-10-CM

## 2020-04-06 DIAGNOSIS — F4310 Post-traumatic stress disorder, unspecified: Secondary | ICD-10-CM

## 2020-04-06 MED ORDER — LITHIUM CARBONATE 300 MG PO CAPS: ORAL_CAPSULE | ORAL | 2 refills | Status: DC

## 2020-04-06 MED ORDER — LORAZEPAM 1 MG PO TABS
1.0000 mg | ORAL_TABLET | Freq: Two times a day (BID) | ORAL | 2 refills | Status: DC
Start: 2020-04-06 — End: 2020-09-14

## 2020-04-06 NOTE — Progress Notes (Signed)
Tiffany [name] [id number] 1950-08-28 70 y.o.  Subjective:   Patient ID:  Tiffany [name] is a 70 y.o. (DOB [birth year]) female.  Chief Complaint: No chief complaint on file.   HPI Tiffany [name] presents to the office today for follow-up of PTSD, insomnia, GAD, BPD 1.  Describes mood today as "ok". Pleasant. Tearful at times. Mood symptoms - reports decreased depression - "gets sad at times", anxiety - "lots", and irritability "at times when triggered". Denies mania. Mood "level". Stating "I'm doing ok - right in the middle". Plans to see a new therapist. Wanting to see a new therapist.  Varying interest and motivation. Taking medications as prescribed. Energy levels stable. Active, walking 4 days a week. Enjoys some usual interests and activities. Married. Lives with husband their daughter. Talking to family and friends.  Appetite stable. Weight gain.. Sleeping well most nights. Averages 8 hours.  Focus and concentration stable. Completing tasks - driving more. Managing some aspects of household.   Denies SI or HI.  Denies AH or VH.   Previous medications: Celexa, Zyprexa, Tegretol, Depakote, Serzone, Topamax, Seroquel, Effexor, Lexapro, Desipramine, Neurontin, Abilify, Geodon, Propanolol, Cymbalta, Cogentin, Trihexyphenadyl, Sinmmet, Provigil, Selegiline, Requip, Amantadine, Prozac, Mirapex, Azilect, Metoclopramide, Baclofen, Artane, Namenda, Latuda.    GAD-7   Flowsheet Row Office Visit from 05/14/2019 in [facility]  Total GAD-7 Score [place] Office Visit from 06/07/2019 in [facility]  Total Score (max 30 points ) 29    PHQ2-9   Flowsheet Row Clinical Support from 03/16/2020 in [facility] Visit from 09/16/2019 in [facility] Visit from 08/24/2019 in [facility] Visit from 05/14/2019 in [facility]  PHQ-2  Total Score 2 0 0 1  PHQ-9 Total Score 3 -- 4 8       Review of Systems:  Review of Systems  Musculoskeletal: Negative for gait problem.  Neurological: Negative for tremors.  Psychiatric/Behavioral:       Please refer to HPI    Medications: I have reviewed the patient's current medications.  Current Outpatient Medications  Medication Sig Dispense Refill  . amLODipine (NORVASC) 2.5 MG tablet Take 1 tablet (2.5 mg total) by mouth daily. 30 tablet 1  . calcitRIOL (ROCALTROL) 0.25 MCG capsule Take 1 capsule (0.25 mcg total) by mouth every Monday, Wednesday, and Friday. 12 capsule 0  . Certolizumab Pegol (CIMZIA) 2 X 200 MG KIT     . hydrOXYzine (ATARAX/VISTARIL) 10 MG tablet Take 1 tablet (10 mg total) by mouth 3 (three) times daily as needed for anxiety. (Patient not taking: Reported on 03/16/2020) 60 tablet 1  . lamoTRIgine (LAMICTAL) 200 MG tablet Take 1 tablet (200 mg total) by mouth at bedtime. 90 tablet 3  . levothyroxine (SYNTHROID) 112 MCG tablet Take 1 tablet (112 mcg total) by mouth daily at 6 (six) AM. 30 tablet 1  . lithium carbonate 300 MG capsule TAKE 1 CAPSULE(300 MG) BY MOUTH AT BEDTIME 30 capsule 2  . LORazepam (ATIVAN) 1 MG tablet Take 1 tablet (1 mg total) by mouth 2 (two) times daily. 60 tablet 2  . nystatin ointment (MYCOSTATIN) Apply to affected area 1-2 times daily 30 g 0   No current facility-administered medications for this visit.    Medication Side Effects: None  Allergies:  Allergies  Allergen Reactions  . Aripiprazole Other (See Comments)    Parkinsonism     . Lactose  Intolerance (Gi) Diarrhea  . Methotrexate Other (See Comments)    Hair loss, severe stomatitis    . Cefdinir Diarrhea    Other reaction(s): Diarrhea Yeast infection and fever; negative c diff  . Etanercept Other (See Comments)    Headaches    . Exemestane Other (See Comments)    Suicidal thoughts with medication    . Fluoxetine Other (See Comments)    Parkinsonism  .  Methylprednisolone Sodium Succ Other (See Comments)    Agitated mania  . Epinephrine Palpitations    tachycardia   . Nitrofurantoin Nausea And Vomiting and Rash         Past Medical History:  Diagnosis Date  . Bipolar 1 disorder ([place])   . Breast cancer ([place])   . Chronic diarrhea    loose stools twice a day on average for years  . Chronic kidney disease (CKD), stage III (moderate) (HCC)   . Depression 1987  . Hospitalization or health care facility admission within last 6 months 04/2019   for fall/seizures  . Malignant neoplasm of overlapping sites of left breast in female, estrogen receptor positive ([place]) 03/14/2016   Dx in 09/2014, s/p bilateral mastectomies and ALND, 0/10 LN. 1.4 cm  Grade I invasive lobular, ER and PR +/Her--, Ki 67 <5% Tried anastrozole for one month, but developed suicidal idea  . Osteoarthritis, knee 09/24/2019   Xray 09/2019  . Parkinson's disease ([place]) 2012  . Psoriatic arthritis ([place])   . PTSD (post-traumatic stress disorder)   . Secondary hyperparathyroidism ([place])   . Tardive dyskinesia     Family History  Problem Relation Age of Onset  . Cancer Mother   . Cancer Sister   . Stroke Maternal Grandfather   . Diabetes Paternal Grandfather   . Cancer Sister     Social History   Socioeconomic History  . Marital status: Married    Spouse name: Not on file  . Number of children: Not on file  . Years of education: Not on file  . Highest education level: Not on file  Occupational History  . Occupation: retired  Tobacco Use  . Smoking status: Never Smoker  . Smokeless tobacco: Never Used  Vaping Use  . Vaping Use: Never used  Substance and Sexual Activity  . Alcohol use: Never  . Drug use: Never  . Sexual activity: Not Currently  Other Topics Concern  . Not on file  Social History Narrative   Moved to area from [place] 08/2018   Lives one story home   Right handed.   Social Determinants of Health   Financial Resource Strain: Low Risk    . Difficulty of Paying Living Expenses: Not hard at all  Food Insecurity: No Food Insecurity  . Worried About Charity fundraiser in the Last Year: Never true  . Ran Out of Food in the Last Year: Never true  Transportation Needs: No Transportation Needs  . Lack of Transportation (Medical): No  . Lack of Transportation (Non-Medical): No  Physical Activity: Inactive  . Days of Exercise per Week: 0 days  . Minutes of Exercise per Session: 0 min  Stress: Stress Concern Present  . Feeling of Stress : To some extent  Social Connections: Moderately Integrated  . Frequency of Communication with Friends and Family: More than three times a week  . Frequency of Social Gatherings with Friends and Family: Three times a week  . Attends Religious Services: 1 to 4 times per year  . Active Member of  Clubs or Organizations: No  . Attends Archivist Meetings: Never  . Marital Status: Married  Human resources officer Violence: Not At Risk  . Fear of Current or Ex-Partner: No  . Emotionally Abused: No  . Physically Abused: No  . Sexually Abused: No    Past Medical History, Surgical history, Social history, and Family history were reviewed and updated as appropriate.   Please see review of systems for further details on the patient's review from today.   Objective:   Physical Exam:  There were no vitals taken for this visit.  Physical Exam Constitutional:      General: She is not in acute distress. Musculoskeletal:        General: No deformity.  Neurological:     Mental Status: She is alert and oriented to person, place, and time.     Coordination: Coordination normal.  Psychiatric:        Attention and Perception: Attention and perception normal. She does not perceive auditory or visual hallucinations.        Mood and Affect: Mood normal. Mood is not anxious or depressed. Affect is not labile, blunt, angry or inappropriate.        Speech: Speech normal.        Behavior: Behavior  normal.        Thought Content: Thought content normal. Thought content is not paranoid or delusional. Thought content does not include homicidal or suicidal ideation. Thought content does not include homicidal or suicidal plan.        Cognition and Memory: Cognition and memory normal.        Judgment: Judgment normal.     Comments: Insight intact     Lab Review:     Component Value Date/Time   NA 140 03/21/2020 0000   NA 138 06/08/2019 0000   K 4.5 03/21/2020 0000   CL 109 03/21/2020 0000   CO2 22 03/21/2020 0000   GLUCOSE 92 03/21/2020 0000   BUN 27 (H) 03/21/2020 0000   BUN 29 (A) 06/08/2019 0000   CREATININE 1.99 (H) 03/21/2020 0000   CALCIUM 9.7 03/21/2020 0000   CALCIUM 9.7 03/21/2020 0000   PROT 7.0 12/03/2019 1105   ALBUMIN 4.0 09/10/2019 0851   AST 14 12/03/2019 1105   ALT 13 12/03/2019 1105   ALKPHOS 125 09/10/2019 0851   BILITOT 1.0 12/03/2019 1105   GFRNONAA 22 (L) 09/13/2019 1243   GFRAA 25 (L) 09/13/2019 1243       Component Value Date/Time   WBC 13.0 (H) 09/08/2019 1347   RBC 4.95 09/08/2019 1347   HGB 14.4 09/08/2019 1347   HCT 46.0 09/08/2019 1347   PLT 220 09/08/2019 1347   MCV 92.9 09/08/2019 1347   MCH 29.1 09/08/2019 1347   MCHC 31.3 09/08/2019 1347   RDW 13.1 09/08/2019 1347   LYMPHSABS 2.5 04/27/2019 0544   MONOABS 0.5 04/27/2019 0544   EOSABS 0.2 04/27/2019 0544   BASOSABS 0.0 04/27/2019 0544    Lithium Lvl  Date Value Ref Range Status  03/21/2020 0.6 0.6 - 1.2 mmol/L Final     No results found for: PHENYTOIN, PHENOBARB, VALPROATE, CBMZ   .res Assessment: Plan:    Plan:  Lorazepam 75m BID for anxiety - make take one tablet extra for severe anxiety symptoms. Lamictal 204mat hs Lithium 30083maily   Labs - lithium level, TSH, CMP January  RTC 4 weeks  Counseled patient regarding potential benefits, risks, and side effects of Lamictal to include potential  risk of Stevens-Johnson syndrome. Advised patient to stop taking  Lamictal and contact office immediately if rash develops and to seek urgent medical attention if rash is severe and/or spreading quickly.  Discussed potential benefits, risk, and side effects of benzodiazepines to include potential risk of tolerance and dependence, as well as possible drowsiness.  Advised patient not to drive if experiencing drowsiness and to take lowest possible effective dose to minimize risk of dependence and tolerance.   Diagnoses and all orders for this visit:  Bipolar I disorder ([place]) -     lithium carbonate 300 MG capsule; TAKE 1 CAPSULE(300 MG) BY MOUTH AT BEDTIME  Generalized anxiety disorder -     LORazepam (ATIVAN) 1 MG tablet; Take 1 tablet (1 mg total) by mouth 2 (two) times daily.  PTSD (post-traumatic stress disorder)  Insomnia, unspecified type  Major depressive disorder, recurrent episode, moderate ([place])     Please see After Visit Summary for patient specific instructions.  Future Appointments  Date Time Provider Comunas  05/02/2020  9:30 AM Inda Coke, [place] [facility]  06/20/2020  2:00 PM Warren Danes, [place] [facility]  08/01/2020  4:00 PM Cameron Sprang, [place] [facility] None  03/22/2021  2:30 PM [facility]    No orders of the defined types were placed in this encounter.   -------------------------------

## 2020-04-10 LAB — HEPATIC FUNCTION PANEL
ALT: 13 (ref 7–35)
AST: 16 (ref 13–35)
Alkaline Phosphatase: 145 — AB (ref 25–125)

## 2020-04-10 LAB — CBC AND DIFFERENTIAL: WBC: 12

## 2020-04-10 LAB — HEPATITIS B SURFACE ANTIGEN: Hepatitis B Surface Ag: NEGATIVE

## 2020-04-10 LAB — BASIC METABOLIC PANEL: Creatinine: 2.2 — AB (ref 0.5–1.1)

## 2020-04-10 LAB — HM HEPATITIS C SCREENING LAB: HM Hepatitis Screen: NEGATIVE

## 2020-04-10 LAB — COMPREHENSIVE METABOLIC PANEL: GFR calc non Af Amer: 22

## 2020-04-11 ENCOUNTER — Encounter: Payer: Self-pay | Admitting: Physician Assistant

## 2020-04-11 ENCOUNTER — Other Ambulatory Visit: Payer: Self-pay

## 2020-04-11 ENCOUNTER — Ambulatory Visit (INDEPENDENT_AMBULATORY_CARE_PROVIDER_SITE_OTHER): Payer: Self-pay | Admitting: Psychiatry

## 2020-04-11 ENCOUNTER — Encounter: Payer: Self-pay | Admitting: Psychiatry

## 2020-04-11 ENCOUNTER — Ambulatory Visit (INDEPENDENT_AMBULATORY_CARE_PROVIDER_SITE_OTHER): Payer: Medicare Other | Admitting: Physician Assistant

## 2020-04-11 VITALS — BP 120/70 | HR 74 | Temp 99.2°F | Resp 16 | Ht 64.0 in | Wt 222.0 lb

## 2020-04-11 DIAGNOSIS — H6011 Cellulitis of right external ear: Secondary | ICD-10-CM

## 2020-04-11 DIAGNOSIS — F319 Bipolar disorder, unspecified: Secondary | ICD-10-CM

## 2020-04-11 MED ORDER — FLUCONAZOLE 150 MG PO TABS: 150.0000 mg | ORAL_TABLET | Freq: Once | ORAL | 0 refills | Status: AC

## 2020-04-11 MED ORDER — SULFAMETHOXAZOLE-TRIMETHOPRIM 800-160 MG PO TABS: 1.0000 | ORAL_TABLET | Freq: Two times a day (BID) | ORAL | 0 refills | Status: AC
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
## 2020-04-11 NOTE — Progress Notes (Signed)
   Subjective:    Patient ID: Tiffany [name], female    DOB: 08-09-1950, 70 y.o.   MRN: [id number]  HPI  70 yo female presents in office today for R outer ear pain. Started 1 week ago. Having right ear pain. Pain on outside of ear. When she lays on that side it makes it worse. Not radiating. Has tried home remedies including Mupirocin Denies any fever.   Review of Systems  Constitutional: Positive for chills and fever.  HENT: Positive for ear pain. Negative for ear discharge, facial swelling, sinus pressure and sinus pain.        Objective:   Physical Exam Vitals and nursing note reviewed.  HENT:     Right Ear: Tympanic membrane and ear canal normal. There is no impacted cerumen.     Left Ear: Tympanic membrane, ear canal and external ear normal.     Ears:   Neurological:     Mental Status: She is alert.         Assessment & Plan:   Cellulitis of R external ear - Rx for Bactrim as directed, warm compresses. Rx diflucan because she is prone to yeast infections. Recheck if worse or no improvement.

## 2020-04-11 NOTE — Patient Instructions (Signed)
Advised to call back directly if there are further questions, or if these symptoms fail to improve as anticipated or worsen.

## 2020-04-14 ENCOUNTER — Encounter: Payer: Self-pay | Admitting: Physician Assistant

## 2020-04-17 ENCOUNTER — Encounter: Payer: Self-pay | Admitting: Physician Assistant

## 2020-04-17 NOTE — Telephone Encounter (Signed)
Please see message below from patient. Pt states her ear is not getting better after using the Bactrium for 5 days.

## 2020-04-17 NOTE — Telephone Encounter (Signed)
Scheduled appt with Inda Coke.

## 2020-04-18 ENCOUNTER — Ambulatory Visit (INDEPENDENT_AMBULATORY_CARE_PROVIDER_SITE_OTHER): Payer: Medicare Other | Admitting: Physician Assistant

## 2020-04-18 ENCOUNTER — Other Ambulatory Visit: Payer: Self-pay

## 2020-04-18 ENCOUNTER — Encounter: Payer: Self-pay | Admitting: Physician Assistant

## 2020-04-18 VITALS — BP 122/72 | HR 78 | Temp 97.9°F | Resp 14 | Ht 64.0 in | Wt 223.0 lb

## 2020-04-18 DIAGNOSIS — L989 Disorder of the skin and subcutaneous tissue, unspecified: Secondary | ICD-10-CM | POA: Diagnosis not present

## 2020-04-18 DIAGNOSIS — M25552 Pain in left hip: Secondary | ICD-10-CM

## 2020-04-18 NOTE — Progress Notes (Signed)
Tiffany [name] is a 70 y.o. female here for a follow up of a pre-existing problem.  History of Present Illness:   Chief Complaint  Patient presents with  . Ear Pain    Finished the antibiotic but has not improved. Does still having pain of the right ear. Worst with touch and laying on that side.     HPI  R ear lesion Was seen by my colleague on 04/12/20 for ear lesion/cellulitis. Prior to this visit, symptoms were present for about a week. She was started on oral bactrim. She took the antibiotic as prescribed but essentially had no change in symptoms. Denies: discharge from ear, fever, chills, severe worsening of pain. Denies prior hx of skin cancer but does have hx of breast cancer.  L hip pain Was seen in our office with Lyndee Hensen. She would like to go to [facility] and see a different therapist as this is more convenient location. L hip pain is actually slightly worse.   Past Medical History:  Diagnosis Date  . Bipolar 1 disorder ([place])   . Breast cancer ([place])   . Chronic diarrhea    loose stools twice a day on average for years  . Chronic kidney disease (CKD), stage III (moderate) (HCC)   . Depression 1987  . Hospitalization or health care facility admission within last 6 months 04/2019   for fall/seizures  . Malignant neoplasm of overlapping sites of left breast in female, estrogen receptor positive ([place]) 03/14/2016   Dx in 09/2014, s/p bilateral mastectomies and ALND, 0/10 LN. 1.4 cm  Grade I invasive lobular, ER and PR +/Her--, Ki 67 <5% Tried anastrozole for one month, but developed suicidal idea  . Osteoarthritis, knee 09/24/2019   Xray 09/2019  . Parkinson's disease ([place]) 2012  . Psoriatic arthritis ([place])   . PTSD (post-traumatic stress disorder)   . Secondary hyperparathyroidism ([place])   . Tardive dyskinesia      Social History   Tobacco Use  . Smoking status: Never Smoker  . Smokeless tobacco: Never Used  Vaping Use  . Vaping Use: Never used  Substance Use  Topics  . Alcohol use: Never  . Drug use: Never    Past Surgical History:  Procedure Laterality Date  . ABDOMINAL HYSTERECTOMY  1987  . CHOLECYSTECTOMY  1979  . DILATION AND CURETTAGE OF UTERUS  1973  . MASTECTOMY Bilateral 09/19/2014  . TONSILLECTOMY  1970  . URETERAL REIMPLANTION Bilateral 1974    Family History  Problem Relation Age of Onset  . Cancer Mother   . Cancer Sister   . Stroke Maternal Grandfather   . Diabetes Paternal Grandfather   . Cancer Sister     Allergies  Allergen Reactions  . Aripiprazole Other (See Comments)    Parkinsonism     . Lactose Intolerance (Gi) Diarrhea  . Methotrexate Other (See Comments)    Hair loss, severe stomatitis    . Cefdinir Diarrhea    Other reaction(s): Diarrhea Yeast infection and fever; negative c diff  . Etanercept Other (See Comments)    Headaches    . Exemestane Other (See Comments)    Suicidal thoughts with medication    . Fluoxetine Other (See Comments)    Parkinsonism  . Methylprednisolone Sodium Succ Other (See Comments)    Agitated mania  . Epinephrine Palpitations    tachycardia   . Nitrofurantoin Nausea And Vomiting and Rash         Current Medications:   Current Outpatient Medications:  .  amLODipine (NORVASC) 2.5 MG tablet, Take 1 tablet (2.5 mg total) by mouth daily., Disp: 30 tablet, Rfl: 1 .  calcitRIOL (ROCALTROL) 0.25 MCG capsule, Take 1 capsule (0.25 mcg total) by mouth every Monday, Wednesday, and Friday., Disp: 12 capsule, Rfl: 0 .  Certolizumab Pegol (CIMZIA) 2 X 200 MG KIT, , Disp: , Rfl:  .  diphenhydrAMINE (BENADRYL) 50 MG tablet, Take 50 mg by mouth at bedtime as needed for itching., Disp: , Rfl:  .  lamoTRIgine (LAMICTAL) 200 MG tablet, Take 1 tablet (200 mg total) by mouth at bedtime., Disp: 90 tablet, Rfl: 3 .  levothyroxine (SYNTHROID) 112 MCG tablet, Take 1 tablet (112 mcg total) by mouth daily at 6 (six) AM., Disp: 30 tablet, Rfl: 1 .  lithium carbonate 300 MG capsule, TAKE  1 CAPSULE(300 MG) BY MOUTH AT BEDTIME, Disp: 30 capsule, Rfl: 2 .  LORazepam (ATIVAN) 1 MG tablet, Take 1 tablet (1 mg total) by mouth 2 (two) times daily., Disp: 60 tablet, Rfl: 2 .  nystatin ointment (MYCOSTATIN), Apply to affected area 1-2 times daily, Disp: 30 g, Rfl: 0   Review of Systems:   ROS  Negative unless otherwise specified per HPI.  Vitals:   Vitals:   04/18/20 0932  BP: 122/72  Pulse: 78  Resp: 14  Temp: 97.9 F (36.6 C)  TempSrc: Temporal  SpO2: 97%  Weight: 223 lb (101.2 kg)  Height: 5' 4"  (1.626 m)     Body mass index is 38.28 kg/m.  Physical Exam:   Physical Exam Vitals and nursing note reviewed.  Constitutional:      General: She is not in acute distress.    Appearance: She is well-developed. She is not ill-appearing, toxic-appearing or sickly-appearing.  HENT:     Ears:   Cardiovascular:     Rate and Rhythm: Normal rate and regular rhythm.     Pulses: Normal pulses.     Heart sounds: Normal heart sounds, S1 normal and S2 normal.     Comments: No LE edema Pulmonary:     Effort: Pulmonary effort is normal.     Breath sounds: Normal breath sounds.  Skin:    General: Skin is warm, dry and intact.  Neurological:     Mental Status: She is alert.     GCS: GCS eye subscore is 4. GCS verbal subscore is 5. GCS motor subscore is 6.  Psychiatric:        Mood and Affect: Mood and affect normal.        Speech: Speech normal.        Behavior: Behavior normal. Behavior is cooperative.       Assessment and Plan:   Cohen was seen today for ear pain.  Diagnoses and all orders for this visit:  Skin lesion Not improved with oral antibiotics. Suspect she needs biopsy for further evaluation. We were able to get into Dermatology in two days at Dr. Juel Burrow office; appreciate coordination of care. -     Ambulatory referral to Dermatology  Pain in left hip Referral to [facility] placed today per patient request. -     Ambulatory referral to Physical  Therapy  Inda Coke, PA-C

## 2020-04-18 NOTE — Patient Instructions (Signed)
It was great to see you!  Referral to physical therapy at [facility] placed today. Someone will contact you about this.  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

## 2020-04-21 ENCOUNTER — Other Ambulatory Visit: Payer: Self-pay

## 2020-04-21 ENCOUNTER — Ambulatory Visit (HOSPITAL_BASED_OUTPATIENT_CLINIC_OR_DEPARTMENT_OTHER): Payer: Medicare Other | Attending: Physician Assistant | Admitting: Physical Therapy

## 2020-04-21 DIAGNOSIS — M25552 Pain in left hip: Secondary | ICD-10-CM | POA: Diagnosis present

## 2020-04-21 DIAGNOSIS — R2689 Other abnormalities of gait and mobility: Secondary | ICD-10-CM | POA: Insufficient documentation

## 2020-04-21 DIAGNOSIS — M25652 Stiffness of left hip, not elsewhere classified: Secondary | ICD-10-CM

## 2020-04-21 DIAGNOSIS — M6281 Muscle weakness (generalized): Secondary | ICD-10-CM | POA: Diagnosis present

## 2020-04-21 DIAGNOSIS — R2681 Unsteadiness on feet: Secondary | ICD-10-CM | POA: Insufficient documentation

## 2020-04-21 NOTE — Therapy (Addendum)
[facility] [address] [place], [name], [address] Phone: [phone]   Fax:  [phone]  Physical Therapy Evaluation  Patient Details  Name: Tiffany [name] MRN: [id number] Date of Birth: [birth year] Referring Provider (PT): Inda Coke, [place]   Encounter Date: 04/21/2020   PT End of Session - 04/21/20 1355    Visit Number 1    Number of Visits 12    Date for PT Re-Evaluation 06/02/20    Authorization Type Medicare  12/28 eval:  1 visit in 2021 here..    PT Start Time 1305    PT Stop Time 1350    PT Time Calculation (min) 45 min    Activity Tolerance Patient tolerated treatment well    Behavior During Therapy WFL for tasks assessed/performed           Past Medical History:  Diagnosis Date  . Bipolar 1 disorder ([place])   . Breast cancer ([place])   . Chronic diarrhea    loose stools twice a day on average for years  . Chronic kidney disease (CKD), stage III (moderate) (HCC)   . Depression 1987  . Hospitalization or health care facility admission within last 6 months 04/2019   for fall/seizures  . Malignant neoplasm of overlapping sites of left breast in female, estrogen receptor positive ([place]) 03/14/2016   Dx in 09/2014, s/p bilateral mastectomies and ALND, 0/10 LN. 1.4 cm  Grade I invasive lobular, ER and PR +/Her--, Ki 67 <5% Tried anastrozole for one month, but developed suicidal idea  . Osteoarthritis, knee 09/24/2019   Xray 09/2019  . Parkinson's disease ([place]) 2012  . Psoriatic arthritis ([place])   . PTSD (post-traumatic stress disorder)   . Secondary hyperparathyroidism ([place])   . Tardive dyskinesia     Past Surgical History:  Procedure Laterality Date  . ABDOMINAL HYSTERECTOMY  1987  . CHOLECYSTECTOMY  1979  . DILATION AND CURETTAGE OF UTERUS  1973  . MASTECTOMY Bilateral 09/19/2014  . TONSILLECTOMY  1970  . URETERAL REIMPLANTION Bilateral 1974    There were no vitals filed for this visit.    Subjective  Assessment - 04/21/20 1307    Subjective Pt reports she was receiving PT in January (last seen 03/20/20). Pt reports she found IASTM painful. Pt states hip pain started off as a lateral L hip pain that started to shoot down her ankle (~1 year ago). Pt states she had x-rays of hip and back and showed degeneration. Pt does not report any significant event that caused her hip pain. Pt reports the pain is intermittent. Pt reports increased use of rollator. Pt sleeps on her right side because of the pain.    Limitations Standing;Walking;House hold activities    How long can you sit comfortably? no issues    How long can you stand comfortably? Could be immediate or an hour. For sure after an hour.    How long can you walk comfortably? Could be    Patient Stated Goals Getting stronger, improving balance, pain free    Currently in Pain? Yes    Pain Score 0-No pain   7/10 if standing or walking too much   Pain Location Hip    Pain Orientation Left    Pain Type Chronic pain    Pain Radiating Towards Foot (sometimes)    Pain Onset More than a month ago    Pain Frequency Intermittent    Pain Onset More than a month ago  [facility] PT Assessment - 04/21/20 0001      Assessment   Medical Diagnosis M25.552 (ICD-10-CM) - Pain in left hip    Referring Provider (PT) Inda Coke, PA    Hand Dominance Right    Prior Therapy Last seen by lauren for hip pain on 03/20/20      Balance Screen   Has the patient fallen in the past 6 months No      [place] residence    Living Arrangements Spouse/significant other;Children    Available Help at Discharge Family    Type of [place] to enter    [place] One level      Prior Function   Level of Independence Independent with household mobility with device      Observation/Other Assessments   Focus on Therapeutic Outcomes (FOTO)  n/a      Functional Tests   Functional tests --    Increased pain when correcting hip shift to right (pt with hips shifted L, with R lateral trunk lean)     Single Leg Stance   Comments 5 sec on R, 8 sec on L      Sit to Stand   Comments --      Posture/Postural Control   Posture/Postural Control Postural limitations    Postural Limitations Rounded Shoulders;Increased thoracic kyphosis;Weight shift right;Left pelvic obliquity      AROM   Overall AROM Comments -15 deg R knee extension from neutral; lumbar ROM mod limitation flex/extension, mild limitation side bend    AROM Assessment Site Hip;Lumbar      Strength   Overall Strength Comments bilat knee 4+/5    Left Hip Flexion 4-/5    Left Hip Extension 4/5    Left Hip ABduction 4-/5      Palpation   Palpation comment Soreness/TTP at L gr troch      Special Tests    Special Tests Lumbar;Sacrolliac Tests;Hip Special Tests    Sacroiliac Tests  Pelvic Compression      Pelvic Compression   Findings Negative      Saralyn Pilar (FABER) Test   Findings Positive    Side Left    Comments anterior thigh and lateral hip pain      Trendelenburg Test   Findings Positive    Comments Bilat; L worse than right      Thomas Test    Findings Negative      Ober's Test   Findings Negative      Transfers   Five time sit to stand comments  10.06 sec                      Objective measurements completed on examination: See above findings.       [facility] Adult PT Treatment/Exercise - 04/21/20 0001      Lumbar Exercises: Stretches   Other Lumbar Stretch Exercise lateral hip shift to left x20 sec; increased pain with R hip shift      Lumbar Exercises: Standing   Other Standing Lumbar Exercises lateral weight shift L<>R x10 reps for improved symmetrical standing    Other Standing Lumbar Exercises hip abduction x10 with counter support      Lumbar Exercises: Supine   Other Supine Lumbar Exercises quad set x10 with 3 sec hold                    PT Short Term Goals  -  04/21/20 1540      PT SHORT TERM GOAL #1   Title Pt to be independent with initial HEP    Time 4    Period Weeks    Status Revised    Target Date 05/19/20      PT SHORT TERM GOAL #2   Title Pt to report decreased pain in L hip to 0-5/ 10 with activity    Time 4    Period Weeks    Status New    Target Date 05/19/20      PT SHORT TERM GOAL #3   Title Pt will be able to maintain SLS with no pain for at least 10 sec bilat    Baseline 5-8 sec tolerated    Time 4    Period Weeks    Status New    Target Date 05/19/20             PT Long Term Goals - 04/21/20 1541      PT LONG TERM GOAL #1   Title Pt to be independent with final HEP for hip, back, knee and mobility    Time 6    Period Weeks    Status Revised    Target Date 06/02/20      PT LONG TERM GOAL #2   Title Pt to report decreased pain in L low back, hip , and LE to 0-3/10 with activity    Time 6    Period Weeks    Status Revised    Target Date 06/02/20      PT LONG TERM GOAL #3   Title Pt to demo improved hip and quad strength to at least 4+/5 to improve stability and pain    Time 6    Period Weeks    Status Revised    Target Date 06/02/20      PT LONG TERM GOAL #4   Title Pt will be able to cook in the kitchen without having to rest due to pain    Time 6    Period Weeks    Status New    Target Date 06/02/20                  Plan - 04/21/20 1411    Clinical Impression Statement Pt re-evaluated for ongoing L hip pain. Ms. Kalb is a 70 y/o F presenting to OPPT due to c/o L lateral hip pain that at times radiates down her leg into her foot. On assessment, pt with chronic R knee OA with decreased knee extension affecting hip positioning in standing and weight bearing. Pt with decreased core, hip abductor and flexor strength with reproducible pain upon shifting trunk and hips into symmetrical standing. Pt's s/s consistent with likely L hip bursitis due to lateral pelvic shift and postural dysfunction  resulting in intermittent sciatica. Pt would benefit from therapy to address these issues for improved comfort performing IADLs such as cooking and cleaning.    Personal Factors and Comorbidities Comorbidity 1;Fitness    Comorbidities multiple pain locations, back, hip, knee, Decreased mobility post covid and hospitalization last year,    Examination-Activity Limitations Stand;Locomotion Level;Bend;Dressing;Squat;Stairs    Examination-Participation Restrictions Cleaning;Meal Prep;Yard Work;Community Activity;Shop;Laundry    Stability/Clinical Decision Making Evolving/Moderate complexity    Rehab Potential Good    PT Frequency 2x / week    PT Duration 6 weeks    PT Treatment/Interventions ADLs/Self Care Home Management;Cryotherapy;Electrical Stimulation;Ultrasound;Traction;Moist Heat;Iontophoresis 4mg /ml Dexamethasone;DME Instruction;Gait training;Stair training;Functional mobility training;Therapeutic activities;Therapeutic exercise;Orthotic Fit/Training;Patient/family education;Neuromuscular  re-education;Balance training;Manual techniques;Taping;Dry needling;Passive range of motion;Spinal Manipulations;Joint Manipulations;Aquatic Therapy;Vasopneumatic Device    PT Next Visit Plan Assess response to HEP. Consider hip strengthening in supine and sidelying if too much pain with standing/weight shifting. Perform hip isometrics.    PT Home Exercise Plan Access Code: [email or web address]    Consulted and Agree with Plan of Care Patient           Patient will benefit from skilled therapeutic intervention in order to improve the following deficits and impairments:  Abnormal gait,Pain,Increased muscle spasms,Decreased activity tolerance,Decreased endurance,Decreased range of motion,Decreased strength,Impaired flexibility,Difficulty walking,Decreased balance,Decreased safety awareness  Visit Diagnosis: Pain in left hip  Muscle weakness (generalized)  Stiffness of left hip, not elsewhere  classified  Unsteadiness on feet  Other abnormalities of gait and mobility     Problem List Patient Active Problem List   Diagnosis Date Noted  . Osteoarthritis, knee 09/24/2019  . CKD (chronic kidney disease) stage 4, GFR 15-29 ml/min (HCC) 09/24/2019  . Severe recurrent major depression without psychotic features ([place]) 09/09/2019  . Bipolar I disorder, most recent episode depressed ([place])   . Transaminitis   . Essential hypertension   . Encephalopathy 04/26/2019  . Leukocytosis 04/21/2019  . Thrombocytopenia ([place]) 09/04/2018  . Vitamin D deficiency 09/04/2018  . Psoriatic arthritis ([place]) 09/04/2018  . Tubular adenoma of colon 05/28/2017  . History of colonic polyps 05/28/2017  . 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 Malignant neoplasm of overlapping sites of left breast in female, estrogen receptor positive ([place]) 03/14/2016  . Chronic kidney disease, stage III (moderate) 01/19/2016  . Tardive dyskinesia 10/18/2015  . History of breast cancer left 2016 12/27/2014  . Osteopenia determined by x-ray 10/31/2014  . Rosacea 05/21/2007  . Bipolar affective disorder, mixed ([place]) 08/16/2004  . Acquired hypothyroidism 04/03/1996    [facility] April Ma L Jamorian Dimaria PT, DPT 04/21/2020, 3:55 PM  [facility] [address] [place], [name], [address] Phone: [phone]   Fax:  [phone]  Name: Makyna Niehoff MRN: [id number] Date of Birth: 02-25-1951

## 2020-04-21 NOTE — Patient Instructions (Signed)
Access Code: [facility] URL: [email or web address] Date: 04/21/2020 Prepared by: Estill Bamberg April Thurnell Garbe  Exercises Side to Side Weight Shift with Counter Support - 1 x daily - 7 x weekly - 1 sets - 10 reps - 2 sec hold Standing Hip Abduction with Counter Support - 1 x daily - 7 x weekly - 2 sets - 10 reps Left Standing Lateral Shift Correction at Wall - Repetitions - 1 x daily - 7 x weekly - 1 sets - 5 reps - 5 sec hold Supine Quad Set - 1 x daily - 7 x weekly - 2 sets - 10 reps - 2-3 sec hold

## 2020-04-21 NOTE — Addendum Note (Signed)
Addended by: Colbert Ewing MARIE L on: 04/21/2020 03:56 PM   Modules accepted: Orders

## 2020-04-26 ENCOUNTER — Ambulatory Visit (HOSPITAL_BASED_OUTPATIENT_CLINIC_OR_DEPARTMENT_OTHER): Payer: Medicare Other | Attending: Physician Assistant | Admitting: Physical Therapy

## 2020-04-26 ENCOUNTER — Other Ambulatory Visit: Payer: Self-pay

## 2020-04-26 DIAGNOSIS — R2689 Other abnormalities of gait and mobility: Secondary | ICD-10-CM

## 2020-04-26 DIAGNOSIS — R2681 Unsteadiness on feet: Secondary | ICD-10-CM | POA: Diagnosis not present

## 2020-04-26 DIAGNOSIS — M25552 Pain in left hip: Secondary | ICD-10-CM | POA: Insufficient documentation

## 2020-04-26 DIAGNOSIS — M6281 Muscle weakness (generalized): Secondary | ICD-10-CM

## 2020-04-26 DIAGNOSIS — R531 Weakness: Secondary | ICD-10-CM | POA: Insufficient documentation

## 2020-04-26 DIAGNOSIS — M25652 Stiffness of left hip, not elsewhere classified: Secondary | ICD-10-CM

## 2020-04-26 NOTE — Therapy (Signed)
[facility] [address] [place], [name], [address] Phone: [phone]   Fax:  [phone]  Physical Therapy Treatment  Patient Details  Name: Tiffany [name] MRN: [id number] Date of Birth: Nov 11, 1950 Referring Provider (PT): Inda Coke, [place]   Encounter Date: 04/26/2020   PT End of Session - 04/26/20 1143    Visit Number 2    Number of Visits 12    Date for PT Re-Evaluation 06/02/20    Authorization Type Medicare    PT Start Time 1100    PT Stop Time 1145    PT Time Calculation (min) 45 min    Activity Tolerance Patient tolerated treatment well    Behavior During Therapy [facility] for tasks assessed/performed           Past Medical History:  Diagnosis Date  . Bipolar 1 disorder ([place])   . Breast cancer ([place])   . Chronic diarrhea    loose stools twice a day on average for years  . Chronic kidney disease (CKD), stage III (moderate) (HCC)   . Depression 1987  . Hospitalization or health care facility admission within last 6 months 04/2019   for fall/seizures  . Malignant neoplasm of overlapping sites of left breast in female, estrogen receptor positive ([place]) 03/14/2016   Dx in 09/2014, s/p bilateral mastectomies and ALND, 0/10 LN. 1.4 cm  Grade I invasive lobular, ER and PR +/Her--, Ki 67 <5% Tried anastrozole for one month, but developed suicidal idea  . Osteoarthritis, knee 09/24/2019   Xray 09/2019  . Parkinson's disease ([place]) 2012  . Psoriatic arthritis ([place])   . PTSD (post-traumatic stress disorder)   . Secondary hyperparathyroidism ([place])   . Tardive dyskinesia     Past Surgical History:  Procedure Laterality Date  . ABDOMINAL HYSTERECTOMY  1987  . CHOLECYSTECTOMY  1979  . DILATION AND CURETTAGE OF UTERUS  1973  . MASTECTOMY Bilateral 09/19/2014  . TONSILLECTOMY  1970  . URETERAL REIMPLANTION Bilateral 1974    There were no vitals filed for this visit.   Subjective Assessment - 04/26/20 1104    Subjective  Pt reports having a good weekend. Pt states that the counter exercises have been okay. No more pain noted with weight shifting exercises. Pt reports interest in aquatics program.    Limitations Standing;Walking;House hold activities    How long can you sit comfortably? no issues    How long can you stand comfortably? Could be immediate or an hour. For sure after an hour.    How long can you walk comfortably? Could be    Patient Stated Goals Getting stronger, improving balance, pain free    Currently in Pain? No/denies    Pain Onset More than a month ago    Pain Onset More than a month ago                             [facility] Adult PT Treatment/Exercise - 04/26/20 0001      Lumbar Exercises: Stretches   Other Lumbar Stretch Exercise L stretch at counter x20 sec forward & side bending      Lumbar Exercises: Aerobic   Nustep L3 x 6 min UE+LE      Lumbar Exercises: Machines for Strengthening   Other Lumbar Machine Exercise Shuttle 50# x10      Lumbar Exercises: Standing   Other Standing Lumbar Exercises lateral weight shift L<>R x10 reps for improved symmetrical standing  Other Standing Lumbar Exercises hip abduction x10 with counter support      Lumbar Exercises: Supine   Pelvic Tilt 10 reps    Bridge 10 reps    Straight Leg Raise 10 reps    Straight Leg Raises Limitations single leg raise with TA    Other Supine Lumbar Exercises quad set x10 with 3 sec hold      Lumbar Exercises: Sidelying   Clam Right;10 reps                    PT Short Term Goals - 04/21/20 1540      PT SHORT TERM GOAL #1   Title Pt to be independent with initial HEP    Time 4    Period Weeks    Status Revised    Target Date 05/19/20      PT SHORT TERM GOAL #2   Title Pt to report decreased pain in L hip to 0-5/ 10 with activity    Time 4    Period Weeks    Status New    Target Date 05/19/20      PT SHORT TERM GOAL #3   Title Pt will be able to maintain SLS with no  pain for at least 10 sec bilat    Baseline 5-8 sec tolerated    Time 4    Period Weeks    Status New    Target Date 05/19/20             PT Long Term Goals - 04/21/20 1541      PT LONG TERM GOAL #1   Title Pt to be independent with final HEP for hip, back, knee and mobility    Time 6    Period Weeks    Status Revised    Target Date 06/02/20      PT LONG TERM GOAL #2   Title Pt to report decreased pain in L low back, hip , and LE to 0-3/10 with activity    Time 6    Period Weeks    Status Revised    Target Date 06/02/20      PT LONG TERM GOAL #3   Title Pt to demo improved hip and quad strength to at least 4+/5 to improve stability and pain    Time 6    Period Weeks    Status Revised    Target Date 06/02/20      PT LONG TERM GOAL #4   Title Pt will be able to cook in the kitchen without having to rest due to pain    Time 6    Period Weeks    Status New    Target Date 06/02/20                 Plan - 04/26/20 1152    Clinical Impression Statement Treatment session focused on pelvic, hip, and core strengthening and stability. Progressed R quad strengthening and endurance exercises. Pt able to tolerate exercises well with improved hip movement without compensation.    Personal Factors and Comorbidities Comorbidity 1;Fitness    Comorbidities multiple pain locations, back, hip, knee, Decreased mobility post covid and hospitalization last year,    Examination-Activity Limitations Stand;Locomotion Level;Bend;Dressing;Squat;Stairs    Examination-Participation Restrictions Cleaning;Meal Prep;Yard Work;Community Activity;Shop;Laundry    Stability/Clinical Decision Making Evolving/Moderate complexity    Rehab Potential Good    PT Frequency 2x / week    PT Duration 6 weeks    PT Treatment/Interventions  ADLs/Self Care Home Management;Cryotherapy;Electrical Stimulation;Ultrasound;Traction;Moist Heat;Iontophoresis 4mg /ml Dexamethasone;DME Instruction;Gait training;Stair  training;Functional mobility training;Therapeutic activities;Therapeutic exercise;Orthotic Fit/Training;Patient/family education;Neuromuscular re-education;Balance training;Manual techniques;Taping;Dry needling;Passive range of motion;Spinal Manipulations;Joint Manipulations;Aquatic Therapy;Vasopneumatic Device    PT Next Visit Plan Assess response to HEP. Consider hip strengthening in supine and sidelying if too much pain with standing/weight shifting. Continue to progress shuttle/squat. Gait training and balance/proprioception exercises.    PT Home Exercise Plan Access Code: [email or web address]    Consulted and Agree with Plan of Care Patient           Patient will benefit from skilled therapeutic intervention in order to improve the following deficits and impairments:  Abnormal gait,Pain,Increased muscle spasms,Decreased activity tolerance,Decreased endurance,Decreased range of motion,Decreased strength,Impaired flexibility,Difficulty walking,Decreased balance,Decreased safety awareness  Visit Diagnosis: Pain in left hip  Muscle weakness (generalized)  Stiffness of left hip, not elsewhere classified  Unsteadiness on feet  Other abnormalities of gait and mobility     Problem List Patient Active Problem List   Diagnosis Date Noted  . Osteoarthritis, knee 09/24/2019  . CKD (chronic kidney disease) stage 4, GFR 15-29 ml/min (HCC) 09/24/2019  . Severe recurrent major depression without psychotic features ([place]) 09/09/2019  . Bipolar I disorder, most recent episode depressed ([place])   . Transaminitis   . Essential hypertension   . Encephalopathy 04/26/2019  . Leukocytosis 04/21/2019  . Thrombocytopenia ([place]) 09/04/2018  . Vitamin D deficiency 09/04/2018  . Psoriatic arthritis ([place]) 09/04/2018  . Tubular adenoma of colon 05/28/2017  . History of colonic polyps 05/28/2017  . 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 Malignant neoplasm of overlapping sites  of left breast in female, estrogen receptor positive ([place]) 03/14/2016  . Chronic kidney disease, stage III (moderate) 01/19/2016  . Tardive dyskinesia 10/18/2015  . History of breast cancer left 2016 12/27/2014  . Osteopenia determined by x-ray 10/31/2014  . Rosacea 05/21/2007  . Bipolar affective disorder, mixed ([place]) 08/16/2004  . Acquired hypothyroidism 04/03/1996    [facility] April Ma L Ramsey Guadamuz PT, DPT 04/26/2020, 1:01 PM  [facility] [address] [place], [name], [address] Phone: [phone]   Fax:  [phone]  Name: Tiffany [name] MRN: [id number] Date of Birth: August 30, 1950

## 2020-04-28 ENCOUNTER — Other Ambulatory Visit: Payer: Self-pay

## 2020-04-28 ENCOUNTER — Ambulatory Visit (HOSPITAL_BASED_OUTPATIENT_CLINIC_OR_DEPARTMENT_OTHER): Payer: Medicare Other | Attending: Physician Assistant | Admitting: Physical Therapy

## 2020-04-28 DIAGNOSIS — R2681 Unsteadiness on feet: Secondary | ICD-10-CM

## 2020-04-28 DIAGNOSIS — M25552 Pain in left hip: Secondary | ICD-10-CM | POA: Diagnosis present

## 2020-04-28 DIAGNOSIS — M6281 Muscle weakness (generalized): Secondary | ICD-10-CM | POA: Diagnosis present

## 2020-04-28 DIAGNOSIS — R2689 Other abnormalities of gait and mobility: Secondary | ICD-10-CM | POA: Diagnosis present

## 2020-04-28 DIAGNOSIS — M25652 Stiffness of left hip, not elsewhere classified: Secondary | ICD-10-CM | POA: Diagnosis present

## 2020-04-28 NOTE — Therapy (Signed)
[facility] [address] [place], [name], [address] Phone: [phone]   Fax:  [phone]  Physical Therapy Treatment  Patient Details  Name: Tiffany [name] MRN: [id number] Date of Birth: 11/03/1950 Referring Provider (PT): Inda Coke, [place]   Encounter Date: 04/28/2020   PT End of Session - 04/28/20 1104    Visit Number 3    Number of Visits 12    Date for PT Re-Evaluation 06/02/20    Authorization Type Medicare    PT Start Time 1104    PT Stop Time 1145    PT Time Calculation (min) 41 min    Activity Tolerance Patient tolerated treatment well    Behavior During Therapy [facility] for tasks assessed/performed           Past Medical History:  Diagnosis Date  . Bipolar 1 disorder ([place])   . Breast cancer ([place])   . Chronic diarrhea    loose stools twice a day on average for years  . Chronic kidney disease (CKD), stage III (moderate) (HCC)   . Depression 1987  . Hospitalization or health care facility admission within last 6 months 04/2019   for fall/seizures  . Malignant neoplasm of overlapping sites of left breast in female, estrogen receptor positive ([place]) 03/14/2016   Dx in 09/2014, s/p bilateral mastectomies and ALND, 0/10 LN. 1.4 cm  Grade I invasive lobular, ER and PR +/Her--, Ki 67 <5% Tried anastrozole for one month, but developed suicidal idea  . Osteoarthritis, knee 09/24/2019   Xray 09/2019  . Parkinson's disease ([place]) 2012  . Psoriatic arthritis ([place])   . PTSD (post-traumatic stress disorder)   . Secondary hyperparathyroidism ([place])   . Tardive dyskinesia     Past Surgical History:  Procedure Laterality Date  . ABDOMINAL HYSTERECTOMY  1987  . CHOLECYSTECTOMY  1979  . DILATION AND CURETTAGE OF UTERUS  1973  . MASTECTOMY Bilateral 09/19/2014  . TONSILLECTOMY  1970  . URETERAL REIMPLANTION Bilateral 1974    There were no vitals filed for this visit.   Subjective Assessment - 04/28/20 1105    Subjective  Pt states she wasn't too sore after last session; however, she did feel sore after peeling potatoes and cutting strawberries. Pt reports mild soreness in her R knee. Pt reports no hip pain within the past week and improved sciatica as well.    Limitations Standing;Walking;House hold activities    How long can you sit comfortably? no issues    How long can you stand comfortably? Could be immediate or an hour. For sure after an hour.    How long can you walk comfortably? Could be    Patient Stated Goals Getting stronger, improving balance, pain free    Currently in Pain? No/denies    Pain Onset More than a month ago    Pain Onset More than a month ago                             [facility] Adult PT Treatment/Exercise - 04/28/20 0001      Lumbar Exercises: Aerobic   Nustep L4 x 6 min      Lumbar Exercises: Machines for Strengthening   Other Lumbar Machine Exercise Shuttle 62# DL x10      Lumbar Exercises: Standing   Other Standing Lumbar Exercises lateral weight shift with quad setting L<>R x10 reps for improved symmetrical standing    Other Standing Lumbar Exercises hip abduction  2x10 orange tband with counter support; hip extension 2x10 orange tband      Lumbar Exercises: Supine   Straight Leg Raise 10 reps                    PT Short Term Goals - 04/21/20 1540      PT SHORT TERM GOAL #1   Title Pt to be independent with initial HEP    Time 4    Period Weeks    Status Revised    Target Date 05/19/20      PT SHORT TERM GOAL #2   Title Pt to report decreased pain in L hip to 0-5/ 10 with activity    Time 4    Period Weeks    Status New    Target Date 05/19/20      PT SHORT TERM GOAL #3   Title Pt will be able to maintain SLS with no pain for at least 10 sec bilat    Baseline 5-8 sec tolerated    Time 4    Period Weeks    Status New    Target Date 05/19/20             PT Long Term Goals - 04/21/20 1541      PT LONG TERM GOAL #1   Title  Pt to be independent with final HEP for hip, back, knee and mobility    Time 6    Period Weeks    Status Revised    Target Date 06/02/20      PT LONG TERM GOAL #2   Title Pt to report decreased pain in L low back, hip , and LE to 0-3/10 with activity    Time 6    Period Weeks    Status Revised    Target Date 06/02/20      PT LONG TERM GOAL #3   Title Pt to demo improved hip and quad strength to at least 4+/5 to improve stability and pain    Time 6    Period Weeks    Status Revised    Target Date 06/02/20      PT LONG TERM GOAL #4   Title Pt will be able to cook in the kitchen without having to rest due to pain    Time 6    Period Weeks    Status New    Target Date 06/02/20                 Plan - 04/28/20 1127    Clinical Impression Statement Treatment focused on progressing hip and pelvic strengthening. Pt with improving hip symmetry in standing and encouraged to make note of her hip symmetry at home while performing tasks. Updated and modified pt's HEP accordingly.    Personal Factors and Comorbidities Comorbidity 1;Fitness    Comorbidities multiple pain locations, back, hip, knee, Decreased mobility post covid and hospitalization last year,    Examination-Activity Limitations Stand;Locomotion Level;Bend;Dressing;Squat;Stairs    Examination-Participation Restrictions Cleaning;Meal Prep;Yard Work;Community Activity;Shop;Laundry    Stability/Clinical Decision Making Evolving/Moderate complexity    Rehab Potential Good    PT Frequency 2x / week    PT Duration 6 weeks    PT Treatment/Interventions ADLs/Self Care Home Management;Cryotherapy;Electrical Stimulation;Ultrasound;Traction;Moist Heat;Iontophoresis 4mg /ml Dexamethasone;DME Instruction;Gait training;Stair training;Functional mobility training;Therapeutic activities;Therapeutic exercise;Orthotic Fit/Training;Patient/family education;Neuromuscular re-education;Balance training;Manual techniques;Taping;Dry  needling;Passive range of motion;Spinal Manipulations;Joint Manipulations;Aquatic Therapy;Vasopneumatic Device    PT Next Visit Plan Assess response to HEP. Continue to progress hip strengthening in  standing/weight shifting. Continue to progress shuttle/squat. Gait training and balance/proprioception exercises.    PT Home Exercise Plan Access Code: [email or web address]    Consulted and Agree with Plan of Care Patient           Patient will benefit from skilled therapeutic intervention in order to improve the following deficits and impairments:  Abnormal gait,Pain,Increased muscle spasms,Decreased activity tolerance,Decreased endurance,Decreased range of motion,Decreased strength,Impaired flexibility,Difficulty walking,Decreased balance,Decreased safety awareness  Visit Diagnosis: Pain in left hip  Muscle weakness (generalized)  Stiffness of left hip, not elsewhere classified  Unsteadiness on feet  Other abnormalities of gait and mobility     Problem List Patient Active Problem List   Diagnosis Date Noted  . Osteoarthritis, knee 09/24/2019  . CKD (chronic kidney disease) stage 4, GFR 15-29 ml/min (HCC) 09/24/2019  . Severe recurrent major depression without psychotic features ([place]) 09/09/2019  . Bipolar I disorder, most recent episode depressed ([place])   . Transaminitis   . Essential hypertension   . Encephalopathy 04/26/2019  . Leukocytosis 04/21/2019  . Thrombocytopenia ([place]) 09/04/2018  . Vitamin D deficiency 09/04/2018  . Psoriatic arthritis ([place]) 09/04/2018  . Tubular adenoma of colon 05/28/2017  . History of colonic polyps 05/28/2017  . 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 Malignant neoplasm of overlapping sites of left breast in female, estrogen receptor positive ([place]) 03/14/2016  . Chronic kidney disease, stage III (moderate) 01/19/2016  . Tardive dyskinesia 10/18/2015  . History of breast cancer left 2016 12/27/2014  . Osteopenia  determined by x-ray 10/31/2014  . Rosacea 05/21/2007  . Bipolar affective disorder, mixed ([place]) 08/16/2004  . Acquired hypothyroidism 04/03/1996    [facility] April Ma L Sinclair Arrazola PT, DPT 04/28/2020, 12:58 PM  [facility] [address] [place], [name], [address] Phone: [phone]   Fax:  [phone]  Name: Tiffany [name] MRN: [id number] Date of Birth: [birth year]

## 2020-05-02 ENCOUNTER — Ambulatory Visit: Payer: Medicare Other | Admitting: Physician Assistant

## 2020-05-03 ENCOUNTER — Ambulatory Visit (HOSPITAL_BASED_OUTPATIENT_CLINIC_OR_DEPARTMENT_OTHER): Payer: Medicare Other | Admitting: Physical Therapy

## 2020-05-04 ENCOUNTER — Ambulatory Visit: Payer: Medicare Other | Admitting: Adult Health

## 2020-05-05 ENCOUNTER — Ambulatory Visit (HOSPITAL_BASED_OUTPATIENT_CLINIC_OR_DEPARTMENT_OTHER): Payer: Medicare Other | Admitting: Physical Therapy

## 2020-05-05 ENCOUNTER — Other Ambulatory Visit: Payer: Self-pay

## 2020-05-05 DIAGNOSIS — M25652 Stiffness of left hip, not elsewhere classified: Secondary | ICD-10-CM

## 2020-05-05 DIAGNOSIS — M25552 Pain in left hip: Secondary | ICD-10-CM | POA: Diagnosis not present

## 2020-05-05 DIAGNOSIS — R2681 Unsteadiness on feet: Secondary | ICD-10-CM

## 2020-05-05 DIAGNOSIS — R2689 Other abnormalities of gait and mobility: Secondary | ICD-10-CM

## 2020-05-05 DIAGNOSIS — M6281 Muscle weakness (generalized): Secondary | ICD-10-CM

## 2020-05-05 NOTE — Therapy (Signed)
[facility] [address] [place], [name], [address] Phone: [phone]   Fax:  [phone]  Physical Therapy Treatment  Patient Details  Name: Tiffany [name] MRN: [id number] Date of Birth: 1950-11-06 Referring Provider (PT): Inda Coke, [place]   Encounter Date: 05/05/2020   PT End of Session - 05/05/20 1231    Visit Number 4    Number of Visits 12    Date for PT Re-Evaluation 06/02/20    Authorization Type Medicare    PT Start Time 1100    PT Stop Time 1145    PT Time Calculation (min) 45 min    Activity Tolerance Patient tolerated treatment well    Behavior During Therapy [name] for tasks assessed/performed           Past Medical History:  Diagnosis Date  . Bipolar 1 disorder ([place])   . Breast cancer ([place])   . Chronic diarrhea    loose stools twice a day on average for years  . Chronic kidney disease (CKD), stage III (moderate) (HCC)   . Depression 1987  . Hospitalization or health care facility admission within last 6 months 04/2019   for fall/seizures  . Malignant neoplasm of overlapping sites of left breast in female, estrogen receptor positive ([place]) 03/14/2016   Dx in 09/2014, s/p bilateral mastectomies and ALND, 0/10 LN. 1.4 cm  Grade I invasive lobular, ER and PR +/Her--, Ki 67 <5% Tried anastrozole for one month, but developed suicidal idea  . Osteoarthritis, knee 09/24/2019   Xray 09/2019  . Parkinson's disease ([place]) 2012  . Psoriatic arthritis ([place])   . PTSD (post-traumatic stress disorder)   . Secondary hyperparathyroidism ([place])   . Tardive dyskinesia     Past Surgical History:  Procedure Laterality Date  . ABDOMINAL HYSTERECTOMY  1987  . CHOLECYSTECTOMY  1979  . DILATION AND CURETTAGE OF UTERUS  1973  . MASTECTOMY Bilateral 09/19/2014  . TONSILLECTOMY  1970  . URETERAL REIMPLANTION Bilateral 1974    There were no vitals filed for this visit.   Subjective Assessment - 05/05/20 1149    Subjective  Pt reports increased soreness since the last session. Pt does note she was additionally assisting her daughter after her surgery and watching her grand children.    Limitations Standing;Walking;House hold activities    How long can you sit comfortably? no issues    How long can you stand comfortably? Could be immediate or an hour. For sure after an hour.    How long can you walk comfortably? Could be    Patient Stated Goals Getting stronger, improving balance, pain free    Currently in Pain? Yes    Pain Score 3     Pain Location Hip    Pain Orientation Left    Pain Descriptors / Indicators Aching;Throbbing    Pain Onset More than a month ago    Pain Onset More than a month ago                             [facility] Adult PT Treatment/Exercise - 05/05/20 0001      Lumbar Exercises: Stretches   Single Knee to Chest Stretch 2 reps;20 seconds    Hip Flexor Stretch Right;30 seconds    Hip Flexor Stretch Limitations Performed with knee bend to include quad stretching (limited due to pt's R knee OA)      Lumbar Exercises: Aerobic   Nustep L4 x  6 min      Lumbar Exercises: Standing   Other Standing Lumbar Exercises hip abduction x10 orange tband around ankles with counter support   Irritated pt's L hip bursa this session; modified to supine clamshell iso     Lumbar Exercises: Supine   Clam 20 reps    Clam Limitations Isometrics    Heel Slides 20 reps    Heel Slides Limitations with PPT    Bent Knee Raise 20 reps    Bent Knee Raise Limitations with PPT      Manual Therapy   Soft tissue mobilization L glute and ITband. Avoided L greater trochanter/bursa as it appeared inflamed                  PT Education - 05/05/20 1153    Education Details Educated pt on reducing pressures on L trochanter bursa -- discussed minimizing L hip adduction/passing midline and modifying exercises as needed.    Person(s) Educated Patient    Methods Explanation;Demonstration;Tactile  cues;Handout;Verbal cues    Comprehension Verbalized understanding;Returned demonstration;Verbal cues required            PT Short Term Goals - 04/21/20 1540      PT SHORT TERM GOAL #1   Title Pt to be independent with initial HEP    Time 4    Period Weeks    Status Revised    Target Date 05/19/20      PT SHORT TERM GOAL #2   Title Pt to report decreased pain in L hip to 0-5/ 10 with activity    Time 4    Period Weeks    Status New    Target Date 05/19/20      PT SHORT TERM GOAL #3   Title Pt will be able to maintain SLS with no pain for at least 10 sec bilat    Baseline 5-8 sec tolerated    Time 4    Period Weeks    Status New    Target Date 05/19/20             PT Long Term Goals - 04/21/20 1541      PT LONG TERM GOAL #1   Title Pt to be independent with final HEP for hip, back, knee and mobility    Time 6    Period Weeks    Status Revised    Target Date 06/02/20      PT LONG TERM GOAL #2   Title Pt to report decreased pain in L low back, hip , and LE to 0-3/10 with activity    Time 6    Period Weeks    Status Revised    Target Date 06/02/20      PT LONG TERM GOAL #3   Title Pt to demo improved hip and quad strength to at least 4+/5 to improve stability and pain    Time 6    Period Weeks    Status Revised    Target Date 06/02/20      PT LONG TERM GOAL #4   Title Pt will be able to cook in the kitchen without having to rest due to pain    Time 6    Period Weeks    Status New    Target Date 06/02/20                 Plan - 05/05/20 1151    Clinical Impression Statement Treatment focused on readjusting and modifying pt's hip  and pelvic exercises without exacerbating her bursa. Pt with TTP L lateral hip and glute -- provided gentle soft tissue mobilization as pt is unable to tolerate IASTM. Pt with increased pain performing standing hip abduction -- modified pt to isometric clam shell.    Personal Factors and Comorbidities Comorbidity  1;Fitness    Comorbidities multiple pain locations, back, hip, knee, Decreased mobility post covid and hospitalization last year,    Examination-Activity Limitations Stand;Locomotion Level;Bend;Dressing;Squat;Stairs    Examination-Participation Restrictions Cleaning;Meal Prep;Yard Work;Community Activity;Shop;Laundry    Stability/Clinical Decision Making Evolving/Moderate complexity    Rehab Potential Good    PT Frequency 2x / week    PT Duration 6 weeks    PT Treatment/Interventions ADLs/Self Care Home Management;Cryotherapy;Electrical Stimulation;Ultrasound;Traction;Moist Heat;Iontophoresis 4mg /ml Dexamethasone;DME Instruction;Gait training;Stair training;Functional mobility training;Therapeutic activities;Therapeutic exercise;Orthotic Fit/Training;Patient/family education;Neuromuscular re-education;Balance training;Manual techniques;Taping;Dry needling;Passive range of motion;Spinal Manipulations;Joint Manipulations;Aquatic Therapy;Vasopneumatic Device    PT Next Visit Plan Assess response to HEP. Continue to progress hip strengthening in standing/weight shifting. Continue to progress shuttle/squat. Gait training and balance/proprioception exercises.    PT Home Exercise Plan Access Code: [email or web address]    Consulted and Agree with Plan of Care Patient           Patient will benefit from skilled therapeutic intervention in order to improve the following deficits and impairments:  Abnormal gait,Pain,Increased muscle spasms,Decreased activity tolerance,Decreased endurance,Decreased range of motion,Decreased strength,Impaired flexibility,Difficulty walking,Decreased balance,Decreased safety awareness  Visit Diagnosis: Pain in left hip  Muscle weakness (generalized)  Stiffness of left hip, not elsewhere classified  Unsteadiness on feet  Other abnormalities of gait and mobility     Problem List Patient Active Problem List   Diagnosis Date Noted  . Osteoarthritis, knee 09/24/2019  . CKD  (chronic kidney disease) stage 4, GFR 15-29 ml/min (HCC) 09/24/2019  . Severe recurrent major depression without psychotic features ([place]) 09/09/2019  . Bipolar I disorder, most recent episode depressed ([place])   . Transaminitis   . Essential hypertension   . Encephalopathy 04/26/2019  . Leukocytosis 04/21/2019  . Thrombocytopenia ([place]) 09/04/2018  . Vitamin D deficiency 09/04/2018  . Psoriatic arthritis ([place]) 09/04/2018  . Tubular adenoma of colon 05/28/2017  . History of colonic polyps 05/28/2017  . 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 Malignant neoplasm of overlapping sites of left breast in female, estrogen receptor positive ([place]) 03/14/2016  . Chronic kidney disease, stage III (moderate) 01/19/2016  . Tardive dyskinesia 10/18/2015  . History of breast cancer left 2016 12/27/2014  . Osteopenia determined by x-ray 10/31/2014  . Rosacea 05/21/2007  . Bipolar affective disorder, mixed ([place]) 08/16/2004  . Acquired hypothyroidism 04/03/1996    [facility] April Ma L Estus Krakowski PT, DPT 05/05/2020, 12:35 PM  [facility] [address] [place], [name], [address] Phone: [phone]   Fax:  [phone]  Name: Tiffany [name] MRN: [id number] Date of Birth: Jul 08, 1950

## 2020-05-09 ENCOUNTER — Encounter: Payer: Self-pay | Admitting: Adult Health

## 2020-05-09 ENCOUNTER — Ambulatory Visit (INDEPENDENT_AMBULATORY_CARE_PROVIDER_SITE_OTHER): Payer: Medicare Other | Admitting: Adult Health

## 2020-05-09 ENCOUNTER — Other Ambulatory Visit: Payer: Self-pay

## 2020-05-09 DIAGNOSIS — F319 Bipolar disorder, unspecified: Secondary | ICD-10-CM

## 2020-05-09 DIAGNOSIS — G4700 Insomnia, unspecified: Secondary | ICD-10-CM | POA: Diagnosis not present

## 2020-05-09 DIAGNOSIS — F411 Generalized anxiety disorder: Secondary | ICD-10-CM | POA: Diagnosis not present

## 2020-05-09 DIAGNOSIS — F4310 Post-traumatic stress disorder, unspecified: Secondary | ICD-10-CM | POA: Diagnosis not present

## 2020-05-09 MED ORDER — LITHIUM CARBONATE 300 MG PO CAPS
ORAL_CAPSULE | ORAL | 3 refills | Status: DC
Start: 2020-05-09 — End: 2021-01-24

## 2020-05-09 NOTE — Progress Notes (Signed)
Tiffany [name] [id number] 1950/07/03 70 y.o.  Subjective:   Patient ID:  Tiffany [name] is a 70 y.o. (DOB November 26, 1950) female.  Chief Complaint: No chief complaint on file.   HPI Tiffany [name] presents to the office today for follow-up of PTSD, insomnia, GAD, BPD 1.  Describes mood today as "ok". Pleasant. Tearful at times. Mood symptoms - denies depression. Feels anxious at times, but is able to manage it with the [facility]. Husband reports reduced irritability. Denies mania. Mood remains "stable". Stating "I think I have been doing really good". Varying interest and motivation. Taking medications as prescribed. Energy levels stable. Active, walking 4 days a week. Enjoys some usual interests and activities. Married. Lives with husband their daughter. Talking to family and friends.  Appetite stable. Weight gain.. Sleeping well most nights. Averages 8 hours.  Focus and concentration stable. Completing tasks - driving more. Managing some aspects of household.   Denies SI or HI.  Denies AH or VH.   Previous medications: Celexa, Zyprexa, Tegretol, Depakote, Serzone, Topamax, Seroquel, Effexor, Lexapro, Desipramine, Neurontin, Abilify, Geodon, Propanolol, Cymbalta, Cogentin, Trihexyphenadyl, Sinmmet, Provigil, Selegiline, Requip, Amantadine, Prozac, Mirapex, Azilect, Metoclopramide, Baclofen, Artane, Namenda, Latuda.   GAD-7   Flowsheet Row Office Visit from 05/14/2019 in [place]  Total GAD-7 Score 21    [place] Office Visit from 06/07/2019 in [place]  Total Score (max 30 points ) 29    PHQ2-9   Flowsheet Row Clinical Support from 03/16/2020 in [place] Visit from 09/16/2019 in [place] Visit from 08/24/2019 in [place] Visit from 05/14/2019 in [place]  PHQ-2 Total Score 2 0 0 1  PHQ-9 Total Score  3 -- 4 8    [place] ED from 09/08/2019 in [place] DEPT ED from 09/04/2019 in [place] DEPT  C-SSRS RISK CATEGORY High Risk High Risk       Review of Systems:  Review of Systems  Musculoskeletal: Negative for gait problem.  Neurological: Negative for tremors.  Psychiatric/Behavioral:       Please refer to HPI    Medications: I have reviewed the patient's current medications.  Current Outpatient Medications  Medication Sig Dispense Refill  . amLODipine (NORVASC) 2.5 MG tablet Take 1 tablet (2.5 mg total) by mouth daily. 30 tablet 1  . calcitRIOL (ROCALTROL) 0.25 MCG capsule Take 1 capsule (0.25 mcg total) by mouth every Monday, Wednesday, and Friday. 12 capsule 0  . Certolizumab Pegol (CIMZIA) 2 X 200 MG KIT     . diphenhydrAMINE (BENADRYL) 50 MG tablet Take 50 mg by mouth at bedtime as needed for itching.    . lamoTRIgine (LAMICTAL) 200 MG tablet Take 1 tablet (200 mg total) by mouth at bedtime. 90 tablet 3  . levothyroxine (SYNTHROID) 112 MCG tablet Take 1 tablet (112 mcg total) by mouth daily at 6 (six) AM. 30 tablet 1  . lithium carbonate 300 MG capsule TAKE 1 CAPSULE(300 MG) BY MOUTH AT BEDTIME 90 capsule 3  . LORazepam (ATIVAN) 1 MG tablet Take 1 tablet (1 mg total) by mouth 2 (two) times daily. 60 tablet 2  . nystatin ointment (MYCOSTATIN) Apply to affected area 1-2 times daily 30 g 0   No current facility-administered medications for this visit.    Medication Side Effects: None  Allergies:  Allergies  Allergen Reactions  . Aripiprazole Other (See Comments)  Parkinsonism     . Lactose Intolerance (Gi) Diarrhea  . Methotrexate Other (See Comments)    Hair loss, severe stomatitis    . Cefdinir Diarrhea    Other reaction(s): Diarrhea Yeast infection and fever; negative c diff  . Etanercept Other (See Comments)    Headaches    . Exemestane Other (See Comments)    Suicidal thoughts with  medication    . Fluoxetine Other (See Comments)    Parkinsonism  . Methylprednisolone Sodium Succ Other (See Comments)    Agitated mania  . Epinephrine Palpitations    tachycardia   . Nitrofurantoin Nausea And Vomiting and Rash         Past Medical History:  Diagnosis Date  . Bipolar 1 disorder ([place])   . Breast cancer ([place])   . Chronic diarrhea    loose stools twice a day on average for years  . Chronic kidney disease (CKD), stage III (moderate) (HCC)   . Depression 1987  . Hospitalization or health care facility admission within last 6 months 04/2019   for fall/seizures  . Malignant neoplasm of overlapping sites of left breast in female, estrogen receptor positive ([place]) 03/14/2016   Dx in 09/2014, s/p bilateral mastectomies and ALND, 0/10 LN. 1.4 cm  Grade I invasive lobular, ER and PR +/Her--, Ki 67 <5% Tried anastrozole for one month, but developed suicidal idea  . Osteoarthritis, knee 09/24/2019   Xray 09/2019  . Parkinson's disease ([place]) 2012  . Psoriatic arthritis ([place])   . PTSD (post-traumatic stress disorder)   . Secondary hyperparathyroidism ([place])   . Tardive dyskinesia     Family History  Problem Relation Age of Onset  . Cancer Mother   . Cancer Sister   . Stroke Maternal Grandfather   . Diabetes Paternal Grandfather   . Cancer Sister     Social History   Socioeconomic History  . Marital status: Married    Spouse name: Not on file  . Number of children: Not on file  . Years of education: Not on file  . Highest education level: Not on file  Occupational History  . Occupation: retired  Tobacco Use  . Smoking status: Never Smoker  . Smokeless tobacco: Never Used  Vaping Use  . Vaping Use: Never used  Substance and Sexual Activity  . Alcohol use: Never  . Drug use: Never  . Sexual activity: Not Currently  Other Topics Concern  . Not on file  Social History Narrative   Moved to area from [place] 08/2018   Lives one story home   Right handed.    Social Determinants of Health   Financial Resource Strain: Low Risk   . Difficulty of Paying Living Expenses: Not hard at all  Food Insecurity: No Food Insecurity  . Worried About Charity fundraiser in the Last Year: Never true  . Ran Out of Food in the Last Year: Never true  Transportation Needs: No Transportation Needs  . Lack of Transportation (Medical): No  . Lack of Transportation (Non-Medical): No  Physical Activity: Inactive  . Days of Exercise per Week: 0 days  . Minutes of Exercise per Session: 0 min  Stress: Stress Concern Present  . Feeling of Stress : To some extent  Social Connections: Moderately Integrated  . Frequency of Communication with Friends and Family: More than three times a week  . Frequency of Social Gatherings with Friends and Family: Three times a week  . Attends Religious Services: 1 to 4 times  per year  . Active Member of Clubs or Organizations: No  . Attends Archivist Meetings: Never  . Marital Status: Married  Human resources officer Violence: Not At Risk  . Fear of Current or Ex-Partner: No  . Emotionally Abused: No  . Physically Abused: No  . Sexually Abused: No    Past Medical History, Surgical history, Social history, and Family history were reviewed and updated as appropriate.   Please see review of systems for further details on the patient's review from today.   Objective:   Physical Exam:  There were no vitals taken for this visit.  Physical Exam Constitutional:      General: She is not in acute distress. Musculoskeletal:        General: No deformity.  Neurological:     Mental Status: She is alert and oriented to person, place, and time.     Coordination: Coordination normal.  Psychiatric:        Attention and Perception: Attention and perception normal. She does not perceive auditory or visual hallucinations.        Mood and Affect: Mood normal. Mood is not anxious or depressed. Affect is not labile, blunt, angry or  inappropriate.        Speech: Speech normal.        Behavior: Behavior normal.        Thought Content: Thought content normal. Thought content is not paranoid or delusional. Thought content does not include homicidal or suicidal ideation. Thought content does not include homicidal or suicidal plan.        Cognition and Memory: Cognition and memory normal.        Judgment: Judgment normal.     Comments: Insight intact     Lab Review:     Component Value Date/Time   NA 140 03/21/2020 0000   NA 138 06/08/2019 0000   K 4.5 03/21/2020 0000   CL 109 03/21/2020 0000   CO2 22 03/21/2020 0000   GLUCOSE 92 03/21/2020 0000   BUN 27 (H) 03/21/2020 0000   BUN 29 (A) 06/08/2019 0000   CREATININE 2.2 (A) 04/10/2020 0000   CREATININE 1.99 (H) 03/21/2020 0000   CALCIUM 9.7 03/21/2020 0000   CALCIUM 9.7 03/21/2020 0000   PROT 7.0 12/03/2019 1105   ALBUMIN 4.0 09/10/2019 0851   AST 16 04/10/2020 0000   ALT 13 04/10/2020 0000   ALKPHOS 145 (A) 04/10/2020 0000   BILITOT 1.0 12/03/2019 1105   GFRNONAA 22 04/10/2020 0000   GFRAA 25 (L) 09/13/2019 1243       Component Value Date/Time   WBC 12.0 04/10/2020 0000   WBC 13.0 (H) 09/08/2019 1347   RBC 4.95 09/08/2019 1347   HGB 14.4 09/08/2019 1347   HCT 46.0 09/08/2019 1347   PLT 220 09/08/2019 1347   MCV 92.9 09/08/2019 1347   MCH 29.1 09/08/2019 1347   MCHC 31.3 09/08/2019 1347   RDW 13.1 09/08/2019 1347   LYMPHSABS 2.5 04/27/2019 0544   MONOABS 0.5 04/27/2019 0544   EOSABS 0.2 04/27/2019 0544   BASOSABS 0.0 04/27/2019 0544    Lithium Lvl  Date Value Ref Range Status  03/21/2020 0.6 0.6 - 1.2 mmol/L Final     No results found for: PHENYTOIN, PHENOBARB, VALPROATE, CBMZ   .res Assessment: Plan:    Plan:  Lorazepam 30m BID for anxiety - make take one tablet extra for severe anxiety symptoms. Lamictal 2011mat hs Lithium 30051maily   Labs - lithium level, TSH, CMP  Reviewed for January draw  RTC 4 weeks  Counseled patient  regarding potential benefits, risks, and side effects of Lamictal to include potential risk of Stevens-Johnson syndrome. Advised patient to stop taking Lamictal and contact office immediately if rash develops and to seek urgent medical attention if rash is severe and/or spreading quickly.  Discussed potential benefits, risk, and side effects of benzodiazepines to include potential risk of tolerance and dependence, as well as possible drowsiness.  Advised patient not to drive if experiencing drowsiness and to take lowest possible effective dose to minimize risk of dependence and tolerance.    Diagnoses and all orders for this visit:  Bipolar I disorder ([place]) -     lithium carbonate 300 MG capsule; TAKE 1 CAPSULE(300 MG) BY MOUTH AT BEDTIME  Generalized anxiety disorder  PTSD (post-traumatic stress disorder)  Insomnia, unspecified type     Please see After Visit Summary for patient specific instructions.  Future Appointments  Date Time Provider [place]  05/10/2020 11:00 AM Bubba Hales April Ma L, PT [facility]  05/12/2020 11:00 AM Bubba Hales April Ma L, PT [facility]  05/17/2020 11:00 AM Bubba Hales April Ma L, PT [facility]  05/19/2020 11:00 AM Bubba Hales April Ma L, PT [facility]  05/24/2020 11:00 AM Bubba Hales April Ma L, PT [facility]  05/26/2020 11:00 AM Bubba Hales April Ma L, PT [facility]  05/31/2020 11:00 AM Bubba Hales April Ma L, PT [facility]  06/02/2020 11:00 AM Bubba Hales April Ma L, PT [facility]  06/05/2020 11:00 AM Inda Coke, [place] [facility]  08/01/2020  4:00 PM Cameron Sprang, MD [facility] None  03/22/2021  2:30 PM [facility] HEALTH COACH [facility]    No orders of the defined types were placed in this encounter.   -------------------------------

## 2020-05-10 ENCOUNTER — Ambulatory Visit (HOSPITAL_BASED_OUTPATIENT_CLINIC_OR_DEPARTMENT_OTHER): Payer: Medicare Other | Attending: Physician Assistant | Admitting: Physical Therapy

## 2020-05-10 DIAGNOSIS — M25652 Stiffness of left hip, not elsewhere classified: Secondary | ICD-10-CM | POA: Diagnosis present

## 2020-05-10 DIAGNOSIS — M25552 Pain in left hip: Secondary | ICD-10-CM

## 2020-05-10 DIAGNOSIS — R2681 Unsteadiness on feet: Secondary | ICD-10-CM | POA: Diagnosis present

## 2020-05-10 DIAGNOSIS — M6281 Muscle weakness (generalized): Secondary | ICD-10-CM | POA: Insufficient documentation

## 2020-05-10 DIAGNOSIS — R2689 Other abnormalities of gait and mobility: Secondary | ICD-10-CM | POA: Insufficient documentation

## 2020-05-10 NOTE — Therapy (Signed)
[facility] [address] [place], [name], [address] Phone: [phone]   Fax:  [phone]  Physical Therapy Treatment  Patient Details  Name: Tiffany [name] MRN: [id number] Date of Birth: [birth year] Referring Provider (PT): Inda Coke, [place]   Encounter Date: 05/10/2020   PT End of Session - 05/10/20 1303    Visit Number 5    Number of Visits 12    Date for PT Re-Evaluation 06/02/20    Authorization Type Medicare    Progress Note Due on Visit 10    PT Start Time 1100    PT Stop Time 1145    PT Time Calculation (min) 45 min    Activity Tolerance Patient tolerated treatment well    Behavior During Therapy [facility] for tasks assessed/performed           Past Medical History:  Diagnosis Date  . Bipolar 1 disorder ([place])   . Breast cancer ([place])   . Chronic diarrhea    loose stools twice a day on average for years  . Chronic kidney disease (CKD), stage III (moderate) (HCC)   . Depression 1987  . Hospitalization or health care facility admission within last 6 months 04/2019   for fall/seizures  . Malignant neoplasm of overlapping sites of left breast in female, estrogen receptor positive ([place]) 03/14/2016   Dx in 09/2014, s/p bilateral mastectomies and ALND, 0/10 LN. 1.4 cm  Grade I invasive lobular, ER and PR +/Her--, Ki 67 <5% Tried anastrozole for one month, but developed suicidal idea  . Osteoarthritis, knee 09/24/2019   Xray 09/2019  . Parkinson's disease ([place]) 2012  . Psoriatic arthritis ([place])   . PTSD (post-traumatic stress disorder)   . Secondary hyperparathyroidism ([place])   . Tardive dyskinesia     Past Surgical History:  Procedure Laterality Date  . ABDOMINAL HYSTERECTOMY  1987  . CHOLECYSTECTOMY  1979  . DILATION AND CURETTAGE OF UTERUS  1973  . MASTECTOMY Bilateral 09/19/2014  . TONSILLECTOMY  1970  . URETERAL REIMPLANTION Bilateral 1974    There were no vitals filed for this visit.   Subjective  Assessment - 05/10/20 1113    Subjective Pt reports more fatigue than soreness. She has continued to assist her daughter and watch her grand children after surgery.    Limitations Standing;Walking;House hold activities    How long can you sit comfortably? no issues    How long can you stand comfortably? Could be immediate or an hour. For sure after an hour.    How long can you walk comfortably? Could be    Patient Stated Goals Getting stronger, improving balance, pain free    Currently in Pain? Yes    Pain Score 3     Pain Location Hip    Pain Orientation Left    Pain Onset More than a month ago    Pain Onset More than a month ago                             [facility] Adult PT Treatment/Exercise - 05/10/20 0001      Lumbar Exercises: Stretches   Hip Flexor Stretch 30 seconds;Right;Left    Quad Stretch Right;Left;30 seconds      Lumbar Exercises: Machines for Strengthening   Other Lumbar Machine Exercise Shuttle 62# DL x10      Lumbar Exercises: Standing   Other Standing Lumbar Exercises Terminal knee extension with orange tband 2x10  Lumbar Exercises: Supine   Clam 20 reps    Clam Limitations green tband    Bridge 5 reps   3 sets   Bridge Limitations With marching    Straight Leg Raise 20 reps                    PT Short Term Goals - 04/21/20 1540      PT SHORT TERM GOAL #1   Title Pt to be independent with initial HEP    Time 4    Period Weeks    Status Revised    Target Date 05/19/20      PT SHORT TERM GOAL #2   Title Pt to report decreased pain in L hip to 0-5/ 10 with activity    Time 4    Period Weeks    Status New    Target Date 05/19/20      PT SHORT TERM GOAL #3   Title Pt will be able to maintain SLS with no pain for at least 10 sec bilat    Baseline 5-8 sec tolerated    Time 4    Period Weeks    Status New    Target Date 05/19/20             PT Long Term Goals - 04/21/20 1541      PT LONG TERM GOAL #1   Title  Pt to be independent with final HEP for hip, back, knee and mobility    Time 6    Period Weeks    Status Revised    Target Date 06/02/20      PT LONG TERM GOAL #2   Title Pt to report decreased pain in L low back, hip , and LE to 0-3/10 with activity    Time 6    Period Weeks    Status Revised    Target Date 06/02/20      PT LONG TERM GOAL #3   Title Pt to demo improved hip and quad strength to at least 4+/5 to improve stability and pain    Time 6    Period Weeks    Status Revised    Target Date 06/02/20      PT LONG TERM GOAL #4   Title Pt will be able to cook in the kitchen without having to rest due to pain    Time 6    Period Weeks    Status New    Target Date 06/02/20                 Plan - 05/10/20 1300    Clinical Impression Statement Pt able to return to clamshells with resistance bands without irritating L hip. Treatment focused on progressing pt's quad, glute, and core strengthening. Modified pt's HEP accordingly to reflect her progress. Pt requires cueing to activate quad with terminal knee extension in standing. Pt continues to have improved hip alignment; requires cueing to decrease trunk flexion in standing.    Personal Factors and Comorbidities Comorbidity 1;Fitness    Comorbidities multiple pain locations, back, hip, knee, Decreased mobility post covid and hospitalization last year,    Examination-Activity Limitations Stand;Locomotion Level;Bend;Dressing;Squat;Stairs    Examination-Participation Restrictions Cleaning;Meal Prep;Yard Work;Community Activity;Shop;Laundry    Stability/Clinical Decision Making Evolving/Moderate complexity    Rehab Potential Good    PT Frequency 2x / week    PT Duration 6 weeks    PT Treatment/Interventions ADLs/Self Care Home Management;Cryotherapy;Electrical Stimulation;Ultrasound;Traction;Moist Heat;Iontophoresis 4mg /ml Dexamethasone;DME Instruction;Gait training;Stair  training;Functional mobility training;Therapeutic  activities;Therapeutic exercise;Orthotic Fit/Training;Patient/family education;Neuromuscular re-education;Balance training;Manual techniques;Taping;Dry needling;Passive range of motion;Spinal Manipulations;Joint Manipulations;Aquatic Therapy;Vasopneumatic Device    PT Next Visit Plan Assess response to HEP. Continue to progress hip strengthening in standing/weight shifting. Continue to progress shuttle/squat. Gait training and balance/proprioception exercises.    PT Home Exercise Plan Access Code: [email or web address]    Consulted and Agree with Plan of Care Patient           Patient will benefit from skilled therapeutic intervention in order to improve the following deficits and impairments:  Abnormal gait,Pain,Increased muscle spasms,Decreased activity tolerance,Decreased endurance,Decreased range of motion,Decreased strength,Impaired flexibility,Difficulty walking,Decreased balance,Decreased safety awareness  Visit Diagnosis: Pain in left hip  Muscle weakness (generalized)  Stiffness of left hip, not elsewhere classified  Unsteadiness on feet  Other abnormalities of gait and mobility     Problem List Patient Active Problem List   Diagnosis Date Noted  . Osteoarthritis, knee 09/24/2019  . CKD (chronic kidney disease) stage 4, GFR 15-29 ml/min (HCC) 09/24/2019  . Severe recurrent major depression without psychotic features ([place]) 09/09/2019  . Bipolar I disorder, most recent episode depressed ([place])   . Transaminitis   . Essential hypertension   . Encephalopathy 04/26/2019  . Leukocytosis 04/21/2019  . Thrombocytopenia ([place]) 09/04/2018  . Vitamin D deficiency 09/04/2018  . Psoriatic arthritis ([place]) 09/04/2018  . Tubular adenoma of colon 05/28/2017  . History of colonic polyps 05/28/2017  . 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 Malignant neoplasm of overlapping sites of left breast in female, estrogen receptor positive ([place]) 03/14/2016  . Chronic  kidney disease, stage III (moderate) 01/19/2016  . Tardive dyskinesia 10/18/2015  . History of breast cancer left 2016 12/27/2014  . Osteopenia determined by x-ray 10/31/2014  . Rosacea 05/21/2007  . Bipolar affective disorder, mixed ([place]) 08/16/2004  . Acquired hypothyroidism 04/03/1996    [facility] April Ma L Sigismund Cross PT, DPT 05/10/2020, 1:08 PM  [facility] [address] [place], [name], [address] Phone: [phone]   Fax:  [phone]  Name: Eyleen Rawlinson MRN: [id number] Date of Birth: 1950/12/23

## 2020-05-11 ENCOUNTER — Ambulatory Visit: Payer: Medicare Other | Admitting: Psychiatry

## 2020-05-12 ENCOUNTER — Ambulatory Visit (HOSPITAL_BASED_OUTPATIENT_CLINIC_OR_DEPARTMENT_OTHER): Payer: Medicare Other | Admitting: Physical Therapy

## 2020-05-17 ENCOUNTER — Other Ambulatory Visit: Payer: Self-pay

## 2020-05-17 ENCOUNTER — Ambulatory Visit (HOSPITAL_BASED_OUTPATIENT_CLINIC_OR_DEPARTMENT_OTHER): Payer: Medicare Other | Admitting: Physical Therapy

## 2020-05-17 DIAGNOSIS — M25552 Pain in left hip: Secondary | ICD-10-CM

## 2020-05-17 DIAGNOSIS — M6281 Muscle weakness (generalized): Secondary | ICD-10-CM

## 2020-05-17 DIAGNOSIS — R2689 Other abnormalities of gait and mobility: Secondary | ICD-10-CM

## 2020-05-17 DIAGNOSIS — M25652 Stiffness of left hip, not elsewhere classified: Secondary | ICD-10-CM

## 2020-05-17 DIAGNOSIS — R2681 Unsteadiness on feet: Secondary | ICD-10-CM

## 2020-05-17 NOTE — Therapy (Signed)
[facility] [address] [place], [name], [address] Phone: [phone]   Fax:  [phone]  Physical Therapy Treatment  Patient Details  Name: Tiffany [name] MRN: [id number] Date of Birth: Dec 14, 1950 Referring Provider (PT): Inda Coke, [place]   Encounter Date: 05/17/2020   PT End of Session - 05/17/20 1144    Visit Number 6    Number of Visits 12    Date for PT Re-Evaluation 06/02/20    Authorization Type Medicare    Progress Note Due on Visit 10    PT Start Time 1100    PT Stop Time 1143    PT Time Calculation (min) 43 min    Activity Tolerance Patient tolerated treatment well    Behavior During Therapy [facility] for tasks assessed/performed           Past Medical History:  Diagnosis Date  . Bipolar 1 disorder ([place])   . Breast cancer ([place])   . Chronic diarrhea    loose stools twice a day on average for years  . Chronic kidney disease (CKD), stage III (moderate) (HCC)   . Depression 1987  . Hospitalization or health care facility admission within last 6 months 04/2019   for fall/seizures  . Malignant neoplasm of overlapping sites of left breast in female, estrogen receptor positive ([place]) 03/14/2016   Dx in 09/2014, s/p bilateral mastectomies and ALND, 0/10 LN. 1.4 cm  Grade I invasive lobular, ER and PR +/Her--, Ki 67 <5% Tried anastrozole for one month, but developed suicidal idea  . Osteoarthritis, knee 09/24/2019   Xray 09/2019  . Parkinson's disease ([place]) 2012  . Psoriatic arthritis ([place])   . PTSD (post-traumatic stress disorder)   . Secondary hyperparathyroidism ([place])   . Tardive dyskinesia     Past Surgical History:  Procedure Laterality Date  . ABDOMINAL HYSTERECTOMY  1987  . CHOLECYSTECTOMY  1979  . DILATION AND CURETTAGE OF UTERUS  1973  . MASTECTOMY Bilateral 09/19/2014  . TONSILLECTOMY  1970  . URETERAL REIMPLANTION Bilateral 1974    There were no vitals filed for this visit.   Subjective  Assessment - 05/17/20 1101    Subjective Pt states she's been sick. Pt reports she's been having a flare up of her arthritis (she's feeling it in her toes, knees, wrist, and fingers). Pt reports her hips have been doing good.    Limitations Standing;Walking;House hold activities    How long can you sit comfortably? no issues    How long can you stand comfortably? Could be immediate or an hour. For sure after an hour.    How long can you walk comfortably? Could be    Patient Stated Goals Getting stronger, improving balance, pain free    Currently in Pain? Yes    Pain Onset More than a month ago    Pain Onset More than a month ago                             [facility] Adult PT Treatment/Exercise - 05/17/20 0001      Lumbar Exercises: Stretches   Gastroc Stretch Right;Left;30 seconds    Gastroc Stretch Limitations Soleus stretch bilat 30 sec    Other Lumbar Stretch Exercise Alternating arm extension for lat stretching 3x20 sec with wrist rotation and hand open/close      Lumbar Exercises: Aerobic   Nustep L4 x 6 min      Lumbar Exercises: Machines for  Strengthening   Other Lumbar Machine Exercise Shuttle 62# DL 2x10; SL 2x10 each      Lumbar Exercises: Standing   Other Standing Lumbar Exercises Tandem stance x30 sec bilat; feet together on foam EO x30 sec & then with head nods/turns x30 sec each; attempted SLS on L for ~8 sec, on R for ~2 sec      Lumbar Exercises: Supine   Heel Slides 5 reps   Slow 10 sec heel slides for stretching and ROM   Bent Knee Raise 20 reps    Bent Knee Raise Limitations with PPT                    PT Short Term Goals - 04/21/20 1540      PT SHORT TERM GOAL #1   Title Pt to be independent with initial HEP    Time 4    Period Weeks    Status Revised    Target Date 05/19/20      PT SHORT TERM GOAL #2   Title Pt to report decreased pain in L hip to 0-5/ 10 with activity    Time 4    Period Weeks    Status New    Target Date  05/19/20      PT SHORT TERM GOAL #3   Title Pt will be able to maintain SLS with no pain for at least 10 sec bilat    Baseline 5-8 sec tolerated    Time 4    Period Weeks    Status New    Target Date 05/19/20             PT Long Term Goals - 04/21/20 1541      PT LONG TERM GOAL #1   Title Pt to be independent with final HEP for hip, back, knee and mobility    Time 6    Period Weeks    Status Revised    Target Date 06/02/20      PT LONG TERM GOAL #2   Title Pt to report decreased pain in L low back, hip , and LE to 0-3/10 with activity    Time 6    Period Weeks    Status Revised    Target Date 06/02/20      PT LONG TERM GOAL #3   Title Pt to demo improved hip and quad strength to at least 4+/5 to improve stability and pain    Time 6    Period Weeks    Status Revised    Target Date 06/02/20      PT LONG TERM GOAL #4   Title Pt will be able to cook in the kitchen without having to rest due to pain    Time 6    Period Weeks    Status New    Target Date 06/02/20                 Plan - 05/17/20 1127    Clinical Impression Statement Pt with increased arthritis flare up today. Session thus primarily focused on gentle strengthening, ROM, and stretching. Pt tolerated treatment well. Pt with decreased ankle DF on R possibly affecting gait and hip alignment. Initiated balance and proprioception exercises    Personal Factors and Comorbidities Comorbidity 1;Fitness    Comorbidities multiple pain locations, back, hip, knee, Decreased mobility post covid and hospitalization last year,    Examination-Activity Limitations Stand;Locomotion Level;Bend;Dressing;Squat;Stairs    Examination-Participation Restrictions Cleaning;Meal Prep;Yard Work;Community Activity;Shop;Laundry  Stability/Clinical Decision Making Evolving/Moderate complexity    Rehab Potential Good    PT Frequency 2x / week    PT Duration 6 weeks    PT Treatment/Interventions ADLs/Self Care Home  Management;Cryotherapy;Electrical Stimulation;Ultrasound;Traction;Moist Heat;Iontophoresis 4mg /ml Dexamethasone;DME Instruction;Gait training;Stair training;Functional mobility training;Therapeutic activities;Therapeutic exercise;Orthotic Fit/Training;Patient/family education;Neuromuscular re-education;Balance training;Manual techniques;Taping;Dry needling;Passive range of motion;Spinal Manipulations;Joint Manipulations;Aquatic Therapy;Vasopneumatic Device    PT Next Visit Plan Assess response to HEP. Continue to progress hip strengthening in standing/weight shifting. Continue to progress shuttle/squat. Gait training and balance/proprioception exercises.    PT Home Exercise Plan Access Code: [email or web address]    Consulted and Agree with Plan of Care Patient           Patient will benefit from skilled therapeutic intervention in order to improve the following deficits and impairments:  Abnormal gait,Pain,Increased muscle spasms,Decreased activity tolerance,Decreased endurance,Decreased range of motion,Decreased strength,Impaired flexibility,Difficulty walking,Decreased balance,Decreased safety awareness  Visit Diagnosis: Pain in left hip  Muscle weakness (generalized)  Stiffness of left hip, not elsewhere classified  Unsteadiness on feet  Other abnormalities of gait and mobility     Problem List Patient Active Problem List   Diagnosis Date Noted  . Osteoarthritis, knee 09/24/2019  . CKD (chronic kidney disease) stage 4, GFR 15-29 ml/min (HCC) 09/24/2019  . Severe recurrent major depression without psychotic features ([place]) 09/09/2019  . Bipolar I disorder, most recent episode depressed ([place])   . Transaminitis   . Essential hypertension   . Encephalopathy 04/26/2019  . Leukocytosis 04/21/2019  . Thrombocytopenia ([place]) 09/04/2018  . Vitamin D deficiency 09/04/2018  . Psoriatic arthritis ([place]) 09/04/2018  . Tubular adenoma of colon 05/28/2017  . History of colonic polyps 05/28/2017  .  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 Malignant neoplasm of overlapping sites of left breast in female, estrogen receptor positive ([place]) 03/14/2016  . Chronic kidney disease, stage III (moderate) 01/19/2016  . Tardive dyskinesia 10/18/2015  . History of breast cancer left 2016 12/27/2014  . Osteopenia determined by x-ray 10/31/2014  . Rosacea 05/21/2007  . Bipolar affective disorder, mixed ([place]) 08/16/2004  . Acquired hypothyroidism 04/03/1996    [facility] April Ma L Caydan Mctavish PT, DPT 05/17/2020, 11:45 AM  [facility] [place], [name], [address] Phone: [phone]   Fax:  [phone]  Name: Tiffany [name] MRN: [id number] Date of Birth: September 24, 1950

## 2020-05-19 ENCOUNTER — Ambulatory Visit (HOSPITAL_BASED_OUTPATIENT_CLINIC_OR_DEPARTMENT_OTHER): Payer: Medicare Other | Admitting: Physical Therapy

## 2020-05-24 ENCOUNTER — Other Ambulatory Visit: Payer: Self-pay

## 2020-05-24 ENCOUNTER — Ambulatory Visit (HOSPITAL_BASED_OUTPATIENT_CLINIC_OR_DEPARTMENT_OTHER): Payer: Medicare Other | Admitting: Physical Therapy

## 2020-05-24 DIAGNOSIS — M25652 Stiffness of left hip, not elsewhere classified: Secondary | ICD-10-CM

## 2020-05-24 DIAGNOSIS — M25552 Pain in left hip: Secondary | ICD-10-CM | POA: Diagnosis not present

## 2020-05-24 DIAGNOSIS — M6281 Muscle weakness (generalized): Secondary | ICD-10-CM

## 2020-05-24 DIAGNOSIS — R2681 Unsteadiness on feet: Secondary | ICD-10-CM

## 2020-05-24 DIAGNOSIS — R2689 Other abnormalities of gait and mobility: Secondary | ICD-10-CM

## 2020-05-24 NOTE — Therapy (Signed)
[facility] [address] [name], [name], [address] Phone: [phone]   Fax:  [phone]  Physical Therapy Treatment  Patient Details  Name: Tiffany [name] MRN: [id number] Date of Birth: 1950-04-26 Referring Provider (PT): Tiffany [name], [place]   Encounter Date: 05/24/2020   PT End of Session - 05/24/20 1354    Visit Number 7    Number of Visits 12    Date for PT Re-Evaluation 06/02/20    Authorization Type Medicare    Progress Note Due on Visit 10    PT Start Time 1100    PT Stop Time 1145    PT Time Calculation (min) 45 min    Activity Tolerance Patient tolerated treatment well    Behavior During Therapy [name] for tasks assessed/performed           Past Medical History:  Diagnosis Date  . Bipolar 1 disorder ([place])   . Breast cancer ([place])   . Chronic diarrhea    loose stools twice a day on average for years  . Chronic kidney disease (CKD), stage III (moderate) (HCC)   . Depression 1987  . Hospitalization or health care facility admission within last 6 months 04/2019   for fall/seizures  . Malignant neoplasm of overlapping sites of left breast in female, estrogen receptor positive ([place]) 03/14/2016   Dx in 09/2014, s/p bilateral mastectomies and ALND, 0/10 LN. 1.4 cm  Grade I invasive lobular, ER and PR +/Her--, Ki 67 <5% Tried anastrozole for one month, but developed suicidal idea  . Osteoarthritis, knee 09/24/2019   Xray 09/2019  . Parkinson's disease ([place]) 2012  . Psoriatic arthritis ([place])   . PTSD (post-traumatic stress disorder)   . Secondary hyperparathyroidism ([name])   . Tardive dyskinesia     Past Surgical History:  Procedure Laterality Date  . ABDOMINAL HYSTERECTOMY  1987  . CHOLECYSTECTOMY  1979  . DILATION AND CURETTAGE OF UTERUS  1973  . MASTECTOMY Bilateral 09/19/2014  . TONSILLECTOMY  1970  . URETERAL REIMPLANTION Bilateral 1974    There were no vitals filed for this visit.   Subjective  Assessment - 05/24/20 1103    Subjective Pt states health wise she has not been doing the best; otherwise, her hips have on and off again good days. Pt reports her arthritis is flaring up today. Pt reports she has been doing the stretching at home.    Limitations Standing;Walking;House hold activities    How long can you sit comfortably? no issues    How long can you stand comfortably? Could be immediate or an hour. For sure after an hour.    How long can you walk comfortably? Could be    Patient Stated Goals Getting stronger, improving balance, pain free    Currently in Pain? Yes    Pain Score 3     Pain Location Hip    Pain Orientation Left    Pain Onset More than a month ago    Pain Onset More than a month ago              Tiffany [name] PT Assessment - 05/24/20 0001      Single Leg Stance   Comments 5 sec on R, 12 sec on [name]                         [facility] Adult PT Treatment/Exercise - 05/24/20 0001      Lumbar Exercises: Stretches   Press photographer  Right;Left;30 seconds      Lumbar Exercises: Machines for Strengthening   Other Lumbar Machine Exercise Shuttle 62# DL 2x10; SL 2x10 each      Lumbar Exercises: Standing   Other Standing Lumbar Exercises Toe raise x10      Lumbar Exercises: Supine   Clam 20 reps    Clam Limitations blue tband    Bridge 20 reps    Straight Leg Raise 20 reps    Straight Leg Raises Limitations with PPT    Other Supine Lumbar Exercises Knees 90/90 alternating foot tap down with PPT 2x10                    PT Short Term Goals - 05/24/20 1105      PT SHORT TERM GOAL #1   Title Pt to be independent with initial HEP    Time 4    Period Weeks    Status Achieved    Target Date 05/19/20      PT SHORT TERM GOAL #2   Title Pt to report decreased pain in [name] hip to 0-5/ 10 with activity    Time 4    Period Weeks    Status Achieved    Target Date 05/19/20      PT SHORT TERM GOAL #3   Title Pt will be able to maintain SLS with  no pain for at least 10 sec bilat    Baseline 5-8 sec tolerated; 5 sec on R, 12 sec on [name]    Time 4    Period Weeks    Status Partially Met    Target Date 05/19/20             PT Long Term Goals - 04/21/20 1541      PT LONG TERM GOAL #1   Title Pt to be independent with final HEP for hip, back, knee and mobility    Time 6    Period Weeks    Status Revised    Target Date 06/02/20      PT LONG TERM GOAL #2   Title Pt to report decreased pain in [name] low back, hip , and LE to 0-3/10 with activity    Time 6    Period Weeks    Status Revised    Target Date 06/02/20      PT LONG TERM GOAL #3   Title Pt to demo improved hip and quad strength to at least 4+/5 to improve stability and pain    Time 6    Period Weeks    Status Revised    Target Date 06/02/20      PT LONG TERM GOAL #4   Title Pt will be able to cook in the kitchen without having to rest due to pain    Time 6    Period Weeks    Status New    Target Date 06/02/20                 Plan - 05/24/20 1151    Clinical Impression Statement Treatment focused on progressing pt's core and hip strengthening exercises. Pt able to tolerate progressions well. Checked pt's goals -- pt has met STG #1 and #2. She has partially met STG #3. Pt able to sustain [name] LE stability in SLS for >10 sec; R LE remains about the same at ~5 sec. Pt found to have decreased R ankle ROM and anterior tibialis strength which is likely affecting her R SLS.  Personal Factors and Comorbidities Comorbidity 1;Fitness    Comorbidities multiple pain locations, back, hip, knee, Decreased mobility post covid and hospitalization last year,    Examination-Activity Limitations Stand;Locomotion Level;Bend;Dressing;Squat;Stairs    Examination-Participation Restrictions Cleaning;Meal Prep;Yard Work;Community Activity;Shop;Laundry    Stability/Clinical Decision Making Evolving/Moderate complexity    Rehab Potential Good    PT Frequency 2x / week    PT Duration  6 weeks    PT Treatment/Interventions ADLs/Self Care Home Management;Cryotherapy;Electrical Stimulation;Ultrasound;Traction;Moist Heat;Iontophoresis 58m/ml Dexamethasone;DME Instruction;Gait training;Stair training;Functional mobility training;Therapeutic activities;Therapeutic exercise;Orthotic Fit/Training;Patient/family education;Neuromuscular re-education;Balance training;Manual techniques;Taping;Dry needling;Passive range of motion;Spinal Manipulations;Joint Manipulations;Aquatic Therapy;Vasopneumatic Device    PT Next Visit Plan Assess response to HEP. R ankle stretching and strengthening anterior tibialis. Continue to progress hip strengthening in standing/weight shifting. Continue to progress shuttle/squat. Gait training and balance/proprioception exercises.    PT Home Exercise Plan Access Code: [email or web address]   Consulted and Agree with Plan of Care Patient           Patient will benefit from skilled therapeutic intervention in order to improve the following deficits and impairments:  Abnormal gait,Pain,Increased muscle spasms,Decreased activity tolerance,Decreased endurance,Decreased range of motion,Decreased strength,Impaired flexibility,Difficulty walking,Decreased balance,Decreased safety awareness  Visit Diagnosis: Pain in left hip  Muscle weakness (generalized)  Stiffness of left hip, not elsewhere classified  Unsteadiness on feet  Other abnormalities of gait and mobility     Problem List Patient Active Problem List   Diagnosis Date Noted  . Osteoarthritis, knee 09/24/2019  . CKD (chronic kidney disease) stage 4, GFR 15-29 ml/min (HCC) 09/24/2019  . Severe recurrent major depression without psychotic features ([place] 09/09/2019  . Bipolar I disorder, most recent episode depressed ([place]   . Transaminitis   . Essential hypertension   . Encephalopathy 04/26/2019  . Leukocytosis 04/21/2019  . Thrombocytopenia ([place] 09/04/2018  . Vitamin D deficiency 09/04/2018  . Psoriatic  arthritis ([place] 09/04/2018  . Tubular adenoma of colon 05/28/2017  . History of colonic polyps 05/28/2017  . 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 Malignant neoplasm of overlapping sites of left breast in female, estrogen receptor positive ([place] 03/14/2016  . Chronic kidney disease, stage III (moderate) 01/19/2016  . Tardive dyskinesia 10/18/2015  . History of breast cancer left 2016 12/27/2014  . Osteopenia determined by x-ray 10/31/2014  . Rosacea 05/21/2007  . Bipolar affective disorder, mixed ([place] 08/16/2004  . Acquired hypothyroidism 04/03/1996    [facility] HospitalApril Ma [name]  PT, DPT 05/24/2020, 1:56 PM  [facility] [address]Phone: [phone]  Fax:  [phone] Name: NKylina VultaggioMRN: [id number] of Birth: [birth year]

## 2020-05-25 ENCOUNTER — Ambulatory Visit: Payer: Medicare Other | Admitting: Psychiatry

## 2020-05-26 ENCOUNTER — Other Ambulatory Visit: Payer: Self-pay

## 2020-05-26 ENCOUNTER — Ambulatory Visit (HOSPITAL_BASED_OUTPATIENT_CLINIC_OR_DEPARTMENT_OTHER): Payer: Medicare Other | Admitting: Physical Therapy

## 2020-05-26 DIAGNOSIS — R2689 Other abnormalities of gait and mobility: Secondary | ICD-10-CM

## 2020-05-26 DIAGNOSIS — R2681 Unsteadiness on feet: Secondary | ICD-10-CM

## 2020-05-26 DIAGNOSIS — M25652 Stiffness of left hip, not elsewhere classified: Secondary | ICD-10-CM

## 2020-05-26 DIAGNOSIS — M6281 Muscle weakness (generalized): Secondary | ICD-10-CM

## 2020-05-26 DIAGNOSIS — M25552 Pain in left hip: Secondary | ICD-10-CM

## 2020-05-26 NOTE — Therapy (Signed)
[facility] [address] [place], [name], [address] Phone: [phone]   Fax:  [phone]  Physical Therapy Treatment  Patient Details  Name: Tiffany [name] MRN: [id number] Date of Birth: Jul 12, 1950 Referring Provider (PT): Inda Coke, [place]   Encounter Date: 05/26/2020   PT End of Session - 05/26/20 1250    Visit Number 8    Number of Visits 12    Date for PT Re-Evaluation 06/02/20    Authorization Type Medicare    Progress Note Due on Visit 10    PT Start Time 1105    PT Stop Time 1145    PT Time Calculation (min) 40 min    Activity Tolerance Patient tolerated treatment well    Behavior During Therapy [facility] for tasks assessed/performed           Past Medical History:  Diagnosis Date  . Bipolar 1 disorder ([place])   . Breast cancer ([place])   . Chronic diarrhea    loose stools twice a day on average for years  . Chronic kidney disease (CKD), stage III (moderate) (HCC)   . Depression 1987  . Hospitalization or health care facility admission within last 6 months 04/2019   for fall/seizures  . Malignant neoplasm of overlapping sites of left breast in female, estrogen receptor positive ([place]) 03/14/2016   Dx in 09/2014, s/p bilateral mastectomies and ALND, 0/10 LN. 1.4 cm  Grade I invasive lobular, ER and PR +/Her--, Ki 67 <5% Tried anastrozole for one month, but developed suicidal idea  . Osteoarthritis, knee 09/24/2019   Xray 09/2019  . Parkinson's disease ([place]) 2012  . Psoriatic arthritis ([place])   . PTSD (post-traumatic stress disorder)   . Secondary hyperparathyroidism ([place])   . Tardive dyskinesia     Past Surgical History:  Procedure Laterality Date  . ABDOMINAL HYSTERECTOMY  1987  . CHOLECYSTECTOMY  1979  . DILATION AND CURETTAGE OF UTERUS  1973  . MASTECTOMY Bilateral 09/19/2014  . TONSILLECTOMY  1970  . URETERAL REIMPLANTION Bilateral 1974    There were no vitals filed for this visit.   Subjective  Assessment - 05/26/20 1110    Subjective Pt reports that she was very sore this morning. She states she took a hot shower and feels less sore now.    Limitations Standing;Walking;House hold activities    How long can you sit comfortably? no issues    How long can you stand comfortably? Could be immediate or an hour. For sure after an hour.    How long can you walk comfortably? Could be    Patient Stated Goals Getting stronger, improving balance, pain free    Pain Onset More than a month ago    Pain Onset More than a month ago                             [facility] Adult PT Treatment/Exercise - 05/26/20 0001      Lumbar Exercises: Stretches   Single Knee to Chest Stretch 2 reps;20 seconds    Double Knee to Chest Stretch 2 reps;20 seconds    Double Knee to Chest Stretch Limitations with small circles    Gastroc Stretch Right;Left;30 seconds    Gastroc Stretch Limitations Soleus stretch bilat 30 sec      Lumbar Exercises: Aerobic   Nustep L4 x 8 min      Lumbar Exercises: Standing   Other Standing Lumbar Exercises Tandem stance  x30 sec bilat; feet together on foam EO x30 sec & then with head nods/turns x30 sec each; on foam EC 2x30 sec head nods/turns;      Lumbar Exercises: Seated   Other Seated Lumbar Exercises Ankle DF x10                    PT Short Term Goals - 05/24/20 1105      PT SHORT TERM GOAL #1   Title Pt to be independent with initial HEP    Time 4    Period Weeks    Status Achieved    Target Date 05/19/20      PT SHORT TERM GOAL #2   Title Pt to report decreased pain in L hip to 0-5/ 10 with activity    Time 4    Period Weeks    Status Achieved    Target Date 05/19/20      PT SHORT TERM GOAL #3   Title Pt will be able to maintain SLS with no pain for at least 10 sec bilat    Baseline 5-8 sec tolerated; 5 sec on R, 12 sec on L    Time 4    Period Weeks    Status Partially Met    Target Date 05/19/20             PT Long Term  Goals - 04/21/20 1541      PT LONG TERM GOAL #1   Title Pt to be independent with final HEP for hip, back, knee and mobility    Time 6    Period Weeks    Status Revised    Target Date 06/02/20      PT LONG TERM GOAL #2   Title Pt to report decreased pain in L low back, hip , and LE to 0-3/10 with activity    Time 6    Period Weeks    Status Revised    Target Date 06/02/20      PT LONG TERM GOAL #3   Title Pt to demo improved hip and quad strength to at least 4+/5 to improve stability and pain    Time 6    Period Weeks    Status Revised    Target Date 06/02/20      PT LONG TERM GOAL #4   Title Pt will be able to cook in the kitchen without having to rest due to pain    Time 6    Period Weeks    Status New    Target Date 06/02/20                 Plan - 05/26/20 1135    Clinical Impression Statement Pt with increased soreness from last session. Pt with increased L ankle edema. Did not increase or readjust HEP -- mostly performed gentle PROM, stretching, and balance this session. Pt tolerated session well and reports decreased pain after treatment.    Personal Factors and Comorbidities Comorbidity 1;Fitness    Comorbidities multiple pain locations, back, hip, knee, Decreased mobility post covid and hospitalization last year,    Examination-Activity Limitations Stand;Locomotion Level;Bend;Dressing;Squat;Stairs    Examination-Participation Restrictions Cleaning;Meal Prep;Yard Work;Community Activity;Shop;Laundry    Stability/Clinical Decision Making Evolving/Moderate complexity    Rehab Potential Good    PT Frequency 2x / week    PT Duration 6 weeks    PT Treatment/Interventions ADLs/Self Care Home Management;Cryotherapy;Electrical Stimulation;Ultrasound;Traction;Moist Heat;Iontophoresis 84m/ml Dexamethasone;DME Instruction;Gait training;Stair training;Functional mobility training;Therapeutic activities;Therapeutic exercise;Orthotic Fit/Training;Patient/family  education;Neuromuscular re-education;Balance training;Manual techniques;Taping;Dry needling;Passive range of motion;Spinal Manipulations;Joint Manipulations;Aquatic Therapy;Vasopneumatic Device    PT Next Visit Plan Assess response to HEP. R ankle stretching and strengthening anterior tibialis. Continue to progress hip strengthening in standing/weight shifting. Continue to progress shuttle/squat. Gait training and balance/proprioception exercises.    PT Home Exercise Plan Access Code: [email or web address]    Consulted and Agree with Plan of Care Patient           Patient will benefit from skilled therapeutic intervention in order to improve the following deficits and impairments:  Abnormal gait,Pain,Increased muscle spasms,Decreased activity tolerance,Decreased endurance,Decreased range of motion,Decreased strength,Impaired flexibility,Difficulty walking,Decreased balance,Decreased safety awareness  Visit Diagnosis: Pain in left hip  Muscle weakness (generalized)  Stiffness of left hip, not elsewhere classified  Unsteadiness on feet  Other abnormalities of gait and mobility     Problem List Patient Active Problem List   Diagnosis Date Noted  . Osteoarthritis, knee 09/24/2019  . CKD (chronic kidney disease) stage 4, GFR 15-29 ml/min (HCC) 09/24/2019  . Severe recurrent major depression without psychotic features ([place]) 09/09/2019  . Bipolar I disorder, most recent episode depressed ([place])   . Transaminitis   . Essential hypertension   . Encephalopathy 04/26/2019  . Leukocytosis 04/21/2019  . Thrombocytopenia ([place]) 09/04/2018  . Vitamin D deficiency 09/04/2018  . Psoriatic arthritis ([place]) 09/04/2018  . Tubular adenoma of colon 05/28/2017  . History of colonic polyps 05/28/2017  . 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 Malignant neoplasm of overlapping sites of left breast in female, estrogen receptor positive ([place]) 03/14/2016  . Chronic kidney  disease, stage III (moderate) 01/19/2016  . Tardive dyskinesia 10/18/2015  . History of breast cancer left 2016 12/27/2014  . Osteopenia determined by x-ray 10/31/2014  . Rosacea 05/21/2007  . Bipolar affective disorder, mixed ([place]) 08/16/2004  . Acquired hypothyroidism 04/03/1996    [facility] April Ma L Maikayla Beggs PT, DPT 05/26/2020, 12:51 PM  [facility] [address] [place], [name], [address] Phone: [phone]   Fax:  [phone]  Name: Tiffany [name] MRN: [id number] Date of Birth: 1951/01/18

## 2020-05-29 ENCOUNTER — Other Ambulatory Visit: Payer: Self-pay | Admitting: Physician Assistant

## 2020-05-31 ENCOUNTER — Other Ambulatory Visit: Payer: Self-pay

## 2020-05-31 ENCOUNTER — Ambulatory Visit (HOSPITAL_BASED_OUTPATIENT_CLINIC_OR_DEPARTMENT_OTHER): Payer: Medicare Other | Admitting: Physical Therapy

## 2020-05-31 DIAGNOSIS — M25552 Pain in left hip: Secondary | ICD-10-CM | POA: Diagnosis not present

## 2020-05-31 DIAGNOSIS — M25652 Stiffness of left hip, not elsewhere classified: Secondary | ICD-10-CM

## 2020-05-31 DIAGNOSIS — R2689 Other abnormalities of gait and mobility: Secondary | ICD-10-CM

## 2020-05-31 DIAGNOSIS — M6281 Muscle weakness (generalized): Secondary | ICD-10-CM

## 2020-05-31 DIAGNOSIS — R2681 Unsteadiness on feet: Secondary | ICD-10-CM

## 2020-05-31 NOTE — Therapy (Signed)
[facility] [address] [place], [name], [address] Phone: [phone]   Fax:  [phone]  Physical Therapy Treatment  Patient Details  Name: Tiffany [name] MRN: [id number] Date of Birth: 11/19/1950 Referring Provider (PT): Inda Coke, [place]   Encounter Date: 05/31/2020   PT End of Session - 05/31/20 1104    Visit Number 9    Number of Visits 12    Date for PT Re-Evaluation 06/02/20    Authorization Type Medicare    Progress Note Due on Visit 10    PT Start Time 1103    PT Stop Time 1145    PT Time Calculation (min) 42 min    Activity Tolerance Patient tolerated treatment well    Behavior During Therapy [facility] for tasks assessed/performed           Past Medical History:  Diagnosis Date  . Bipolar 1 disorder ([place])   . Breast cancer ([place])   . Chronic diarrhea    loose stools twice a day on average for years  . Chronic kidney disease (CKD), stage III (moderate) (HCC)   . Depression 1987  . Hospitalization or health care facility admission within last 6 months 04/2019   for fall/seizures  . Malignant neoplasm of overlapping sites of left breast in female, estrogen receptor positive ([place]) 03/14/2016   Dx in 09/2014, s/p bilateral mastectomies and ALND, 0/10 LN. 1.4 cm  Grade I invasive lobular, ER and PR +/Her--, Ki 67 <5% Tried anastrozole for one month, but developed suicidal idea  . Osteoarthritis, knee 09/24/2019   Xray 09/2019  . Parkinson's disease ([place]) 2012  . Psoriatic arthritis ([place])   . PTSD (post-traumatic stress disorder)   . Secondary hyperparathyroidism ([place])   . Tardive dyskinesia     Past Surgical History:  Procedure Laterality Date  . ABDOMINAL HYSTERECTOMY  1987  . CHOLECYSTECTOMY  1979  . DILATION AND CURETTAGE OF UTERUS  1973  . MASTECTOMY Bilateral 09/19/2014  . TONSILLECTOMY  1970  . URETERAL REIMPLANTION Bilateral 1974    There were no vitals filed for this visit.   Subjective  Assessment - 05/31/20 1105    Subjective Pt reports she is feeling better in general.    Limitations Standing;Walking;House hold activities    How long can you sit comfortably? no issues    How long can you stand comfortably? Could be immediate or an hour. For sure after an hour.    How long can you walk comfortably? Could be    Patient Stated Goals Getting stronger, improving balance, pain free    Currently in Pain? No/denies    Pain Onset More than a month ago    Pain Onset More than a month ago                             [facility] Adult PT Treatment/Exercise - 05/31/20 0001      Ambulation/Gait   Ambulation Distance (Feet) 706 Feet   670' 6 MWT   Assistive device None    Gait Pattern Step-through pattern;Lateral trunk lean to left    Ambulation Surface Level;Indoor      Lumbar Exercises: Stretches   Press photographer Right;Left;30 seconds    Gastroc Stretch Limitations Soleus stretch bilat 30 sec      Lumbar Exercises: Aerobic   Nustep L6 x 5 min      Lumbar Exercises: Standing   Heel Raises 10 reps  with toe raises   Wall Slides 20 reps    Wall Slides Limitations Cues to shift to R    Other Standing Lumbar Exercises Tandem stance x30 sec bilat; tandem 2x10 head nods/turns;                    PT Short Term Goals - 05/24/20 1105      PT SHORT TERM GOAL #1   Title Pt to be independent with initial HEP    Time 4    Period Weeks    Status Achieved    Target Date 05/19/20      PT SHORT TERM GOAL #2   Title Pt to report decreased pain in L hip to 0-5/ 10 with activity    Time 4    Period Weeks    Status Achieved    Target Date 05/19/20      PT SHORT TERM GOAL #3   Title Pt will be able to maintain SLS with no pain for at least 10 sec bilat    Baseline 5-8 sec tolerated; 5 sec on R, 12 sec on L    Time 4    Period Weeks    Status Partially Met    Target Date 05/19/20             PT Long Term Goals - 04/21/20 1541      PT LONG TERM  GOAL #1   Title Pt to be independent with final HEP for hip, back, knee and mobility    Time 6    Period Weeks    Status Revised    Target Date 06/02/20      PT LONG TERM GOAL #2   Title Pt to report decreased pain in L low back, hip , and LE to 0-3/10 with activity    Time 6    Period Weeks    Status Revised    Target Date 06/02/20      PT LONG TERM GOAL #3   Title Pt to demo improved hip and quad strength to at least 4+/5 to improve stability and pain    Time 6    Period Weeks    Status Revised    Target Date 06/02/20      PT LONG TERM GOAL #4   Title Pt will be able to cook in the kitchen without having to rest due to pain    Time 6    Period Weeks    Status New    Target Date 06/02/20                 Plan - 05/31/20 1209    Clinical Impression Statement Session focused on increasing and progressing pt's exercises in standing and walking. Pt tends to shift weight to her L. Pt with less joint pain today. Pt with improving stability with her balance exercises -- able to amb without rollator for 706'. Discussed aquatics.    Personal Factors and Comorbidities Comorbidity 1;Fitness    Comorbidities multiple pain locations, back, hip, knee, Decreased mobility post covid and hospitalization last year,    Examination-Activity Limitations Stand;Locomotion Level;Bend;Dressing;Squat;Stairs    Examination-Participation Restrictions Cleaning;Meal Prep;Yard Work;Community Activity;Shop;Laundry    Stability/Clinical Decision Making Evolving/Moderate complexity    Rehab Potential Good    PT Frequency 2x / week    PT Duration 6 weeks    PT Treatment/Interventions ADLs/Self Care Home Management;Cryotherapy;Electrical Stimulation;Ultrasound;Traction;Moist Heat;Iontophoresis 27m/ml Dexamethasone;DME Instruction;Gait training;Stair training;Functional mobility training;Therapeutic activities;Therapeutic exercise;Orthotic Fit/Training;Patient/family education;Neuromuscular  re-education;Balance training;Manual techniques;Taping;Dry needling;Passive range of motion;Spinal Manipulations;Joint Manipulations;Aquatic Therapy;Vasopneumatic Device    PT Next Visit Plan Assess response to HEP. R ankle stretching and strengthening anterior tibialis. Continue to progress hip strengthening in standing/weight shifting. Progress wall squats. Gait training and balance/proprioception exercises.    PT Home Exercise Plan Access Code: [email or web address]    Consulted and Agree with Plan of Care Patient           Patient will benefit from skilled therapeutic intervention in order to improve the following deficits and impairments:  Abnormal gait,Pain,Increased muscle spasms,Decreased activity tolerance,Decreased endurance,Decreased range of motion,Decreased strength,Impaired flexibility,Difficulty walking,Decreased balance,Decreased safety awareness  Visit Diagnosis: Pain in left hip  Muscle weakness (generalized)  Stiffness of left hip, not elsewhere classified  Unsteadiness on feet  Other abnormalities of gait and mobility     Problem List Patient Active Problem List   Diagnosis Date Noted  . Osteoarthritis, knee 09/24/2019  . CKD (chronic kidney disease) stage 4, GFR 15-29 ml/min (HCC) 09/24/2019  . Severe recurrent major depression without psychotic features ([place]) 09/09/2019  . Bipolar I disorder, most recent episode depressed ([place])   . Transaminitis   . Essential hypertension   . Encephalopathy 04/26/2019  . Leukocytosis 04/21/2019  . Thrombocytopenia ([place]) 09/04/2018  . Vitamin D deficiency 09/04/2018  . Psoriatic arthritis ([place]) 09/04/2018  . Tubular adenoma of colon 05/28/2017  . History of colonic polyps 05/28/2017  . 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 Malignant neoplasm of overlapping sites of left breast in female, estrogen receptor positive ([place]) 03/14/2016  . Chronic kidney disease, stage III (moderate) 01/19/2016   . Tardive dyskinesia 10/18/2015  . History of breast cancer left 2016 12/27/2014  . Osteopenia determined by x-ray 10/31/2014  . Rosacea 05/21/2007  . Bipolar affective disorder, mixed ([place]) 08/16/2004  . Acquired hypothyroidism 04/03/1996    [facility] [address] PT, DPT 05/31/2020, 12:14 PM  [facility] [address] [place], [name], [address] Phone: [phone]   Fax:  [phone]  Name: Tiffany [name] MRN: [id number] Date of Birth: Jun 07, 1950

## 2020-06-02 ENCOUNTER — Ambulatory Visit (HOSPITAL_BASED_OUTPATIENT_CLINIC_OR_DEPARTMENT_OTHER): Payer: Medicare Other | Admitting: Physical Therapy

## 2020-06-05 ENCOUNTER — Encounter: Payer: Self-pay | Admitting: Physician Assistant

## 2020-06-05 ENCOUNTER — Other Ambulatory Visit: Payer: Self-pay

## 2020-06-05 ENCOUNTER — Ambulatory Visit (INDEPENDENT_AMBULATORY_CARE_PROVIDER_SITE_OTHER): Payer: Medicare Other | Admitting: Physician Assistant

## 2020-06-05 VITALS — BP 138/80 | HR 79 | Temp 98.5°F | Wt 222.8 lb

## 2020-06-05 DIAGNOSIS — H61001 Unspecified perichondritis of right external ear: Secondary | ICD-10-CM

## 2020-06-05 DIAGNOSIS — F4310 Post-traumatic stress disorder, unspecified: Secondary | ICD-10-CM | POA: Diagnosis not present

## 2020-06-05 DIAGNOSIS — L719 Rosacea, unspecified: Secondary | ICD-10-CM | POA: Diagnosis not present

## 2020-06-05 DIAGNOSIS — F3160 Bipolar disorder, current episode mixed, unspecified: Secondary | ICD-10-CM

## 2020-06-05 MED ORDER — METRONIDAZOLE 1 % EX GEL: Freq: Every day | CUTANEOUS | 0 refills | Status: DC

## 2020-06-05 NOTE — Progress Notes (Signed)
Tiffany [name] is a 70 y.o. female is here to discuss:   History of Present Illness:   Chief Complaint  Patient presents with  . Follow-up    Dermatology referral went well, will fill out a release form. Would like to know if you could recommend a therapist as well.  . Rosacea    Wants to discuss a medication for this if possible.    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 Symptoms improving. Reports that she has to use a donut pillow for her R ear that does help take some pressure off.   Rosacea Ongoing, chronic issue. Has used topical metronidazole in the past and had good relief with this.   PTSD/Bipolar I Medications managed by Deloria Lair, NP. She is seeing a therapist and is interested in seeing someone else, not really connected to who she is seeing currently. Denies SI/HI.  Depression screen [facility] 2/9 06/05/2020 03/16/2020 09/16/2019 08/24/2019 05/14/2019  Decreased Interest 1 1 0 0 0  Down, Depressed, Hopeless 3 1 0 0 1  PHQ - 2 Score 4 2 0 0 1  Altered sleeping 0 0 - 2 1  Tired, decreased energy 0 0 - 2 2  Change in appetite 0 0 - 0 1  Feeling bad or failure about yourself  3 0 - 0 2  Trouble concentrating 3 0 - 0 1  Moving slowly or fidgety/restless 1 0 - 0 0  Suicidal thoughts 0 1 - 0 0  PHQ-9 Score 11 3 - 4 8  Difficult doing work/chores Somewhat difficult Somewhat difficult - Not difficult at all Somewhat difficult  Some recent data might be hidden      There are no preventive care reminders to display for this patient.  Past Medical History:  Diagnosis Date  . Bipolar 1 disorder ([place])   . Breast cancer ([place])   . Chronic diarrhea    loose stools twice a day on average for years  . Chronic kidney disease (CKD), stage III (moderate) (HCC)   . Depression 1987  . Hospitalization or health care facility admission within last 6 months 04/2019   for fall/seizures  . Malignant neoplasm of overlapping sites of left breast in  female, estrogen receptor positive ([place]) 03/14/2016   Dx in 09/2014, s/p bilateral mastectomies and ALND, 0/10 LN. 1.4 cm  Grade I invasive lobular, ER and PR +/Her--, Ki 67 <5% Tried anastrozole for one month, but developed suicidal idea  . Osteoarthritis, knee 09/24/2019   Xray 09/2019  . Parkinson's disease ([place]) 2012  . Psoriatic arthritis ([place])   . PTSD (post-traumatic stress disorder)   . Secondary hyperparathyroidism ([place])   . Tardive dyskinesia      Social History   Tobacco Use  . Smoking status: Never Smoker  . Smokeless tobacco: Never Used  Vaping Use  . Vaping Use: Never used  Substance Use Topics  . Alcohol use: Never  . Drug use: Never    Past Surgical History:  Procedure Laterality Date  . ABDOMINAL HYSTERECTOMY  1987  . CHOLECYSTECTOMY  1979  . DILATION AND CURETTAGE OF UTERUS  1973  . MASTECTOMY Bilateral 09/19/2014  . TONSILLECTOMY  1970  . URETERAL REIMPLANTION Bilateral 1974    Family History  Problem Relation Age of Onset  . Cancer Mother   . Cancer Sister   . Stroke Maternal Grandfather   . Diabetes Paternal Grandfather   . Cancer Sister     PMHx, SurgHx, SocialHx, FamHx,  Medications, and Allergies were reviewed in the Visit Navigator and updated as appropriate.   Patient Active Problem List   Diagnosis Date Noted  . Osteoarthritis, knee 09/24/2019  . CKD (chronic kidney disease) stage 4, GFR 15-29 ml/min (HCC) 09/24/2019  . Severe recurrent major depression without psychotic features ([place]) 09/09/2019  . Bipolar I disorder, most recent episode depressed ([place])   . Transaminitis   . Essential hypertension   . Encephalopathy 04/26/2019  . Leukocytosis 04/21/2019  . Thrombocytopenia ([place]) 09/04/2018  . Vitamin D deficiency 09/04/2018  . Psoriatic arthritis ([place]) 09/04/2018  . Tubular adenoma of colon 05/28/2017  . History of colonic polyps 05/28/2017  . 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 Malignant  neoplasm of overlapping sites of left breast in female, estrogen receptor positive ([place]) 03/14/2016  . Chronic kidney disease, stage III (moderate) 01/19/2016  . Tardive dyskinesia 10/18/2015  . History of breast cancer left 2016 12/27/2014  . Osteopenia determined by x-ray 10/31/2014  . Rosacea 05/21/2007  . Bipolar affective disorder, mixed ([place]) 08/16/2004  . Acquired hypothyroidism 04/03/1996    Social History   Tobacco Use  . Smoking status: Never Smoker  . Smokeless tobacco: Never Used  Vaping Use  . Vaping Use: Never used  Substance Use Topics  . Alcohol use: Never  . Drug use: Never    Current Medications and Allergies:    Current Outpatient Medications:  .  amLODipine (NORVASC) 2.5 MG tablet, TAKE 1 TABLET(2.5 MG) BY MOUTH DAILY, Disp: 90 tablet, Rfl: 0 .  calcitRIOL (ROCALTROL) 0.25 MCG capsule, Take 1 capsule (0.25 mcg total) by mouth every Monday, Wednesday, and Friday., Disp: 12 capsule, Rfl: 0 .  Certolizumab Pegol (CIMZIA) 2 X 200 MG KIT, , Disp: , Rfl:  .  diphenhydrAMINE (BENADRYL) 50 MG tablet, Take 50 mg by mouth at bedtime as needed for itching., Disp: , Rfl:  .  lamoTRIgine (LAMICTAL) 200 MG tablet, Take 1 tablet (200 mg total) by mouth at bedtime., Disp: 90 tablet, Rfl: 3 .  levothyroxine (SYNTHROID) 112 MCG tablet, TAKE 1 TABLET(112 MCG) BY MOUTH DAILY AT 6 AM, Disp: 90 tablet, Rfl: 0 .  lithium carbonate 300 MG capsule, TAKE 1 CAPSULE(300 MG) BY MOUTH AT BEDTIME, Disp: 90 capsule, Rfl: 3 .  LORazepam (ATIVAN) 1 MG tablet, Take 1 tablet (1 mg total) by mouth 2 (two) times daily. (Patient taking differently: Take 1 mg by mouth 2 (two) times daily as needed.), Disp: 60 tablet, Rfl: 2 .  metroNIDAZOLE (METROGEL) 1 % gel, Apply topically daily., Disp: 45 g, Rfl: 0 .  nystatin ointment (MYCOSTATIN), Apply to affected area 1-2 times daily (Patient not taking: Reported on 06/05/2020), Disp: 30 g, Rfl: 0   Allergies  Allergen Reactions  . Aripiprazole Other (See  Comments)    Parkinsonism     . Lactose Intolerance (Gi) Diarrhea  . Methotrexate Other (See Comments)    Hair loss, severe stomatitis    . Cefdinir Diarrhea    Other reaction(s): Diarrhea Yeast infection and fever; negative c diff  . Etanercept Other (See Comments)    Headaches    . Exemestane Other (See Comments)    Suicidal thoughts with medication    . Fluoxetine Other (See Comments)    Parkinsonism  . Methylprednisolone Sodium Succ Other (See Comments)    Agitated mania  . Epinephrine Palpitations    tachycardia   . Nitrofurantoin Nausea And Vomiting and Rash  Review of Systems   ROS  Negative unless otherwise specified per HPI.  Vitals:   Vitals:   06/05/20 1118  BP: 138/80  Pulse: 79  Temp: 98.5 F (36.9 C)  TempSrc: Temporal  SpO2: 98%  Weight: 222 lb 12.8 oz (101.1 kg)     Body mass index is 38.24 kg/m.   Physical Exam:    Physical Exam Vitals and nursing note reviewed.  Constitutional:      General: She is not in acute distress.    Appearance: She is well-developed. She is not ill-appearing or toxic-appearing.  Cardiovascular:     Rate and Rhythm: Normal rate and regular rhythm.     Pulses: Normal pulses.     Heart sounds: Normal heart sounds, S1 normal and S2 normal.     Comments: No LE edema Pulmonary:     Effort: Pulmonary effort is normal.     Breath sounds: Normal breath sounds.  Skin:    General: Skin is warm and dry.     Comments: Erythema to bilateral cheeks with no obvious pustules noted  Neurological:     Mental Status: She is alert.     GCS: GCS eye subscore is 4. GCS verbal subscore is 5. GCS motor subscore is 6.  Psychiatric:        Speech: Speech normal.        Behavior: Behavior normal. Behavior is cooperative.      Assessment and Plan:    [place] was seen today for follow-up and rosacea.  Diagnoses and all orders for this visit:  Chondrodermatitis nodularis helicis of right ear Reviewed records with  patient. She feels like she is improving.  Rosacea Will trial topical metrogel 1%. Follow-up with derm if lack of improvement.  Bipolar affective disorder, mixed ([place]); PTSD (post-traumatic stress disorder) I discussed with patient that if they develop any SI, to tell someone immediately and seek medical attention. Referral to [employer]. -     Ambulatory referral to Psychology  Other orders -     metroNIDAZOLE (METROGEL) 1 % gel; Apply topically daily.   Inda Coke, PA-C [place] [place] 06/05/2020  Follow-up: No follow-ups on file.

## 2020-06-05 NOTE — Patient Instructions (Signed)
It was great to see you!  I'm glad that your ear is improving and PT is going well.  I have sent in topical antibiotic for your rosacea.  I'm putting in referral for you to see a new therapist.  Let's follow-up in 6 months, sooner if you have concerns.  If a referral was placed today, you will be contacted for an appointment. Please note that routine referrals can sometimes take up to 3-4 weeks to process. Please call our office if you haven't heard anything after this time frame.  Take care,  Inda Coke PA-C

## 2020-06-06 ENCOUNTER — Telehealth: Payer: Self-pay | Admitting: Adult Health

## 2020-06-06 ENCOUNTER — Encounter: Payer: Self-pay | Admitting: Adult Health

## 2020-06-06 ENCOUNTER — Ambulatory Visit (INDEPENDENT_AMBULATORY_CARE_PROVIDER_SITE_OTHER): Payer: Medicare Other | Admitting: Adult Health

## 2020-06-06 DIAGNOSIS — F411 Generalized anxiety disorder: Secondary | ICD-10-CM | POA: Diagnosis not present

## 2020-06-06 DIAGNOSIS — F4310 Post-traumatic stress disorder, unspecified: Secondary | ICD-10-CM

## 2020-06-06 DIAGNOSIS — G4700 Insomnia, unspecified: Secondary | ICD-10-CM | POA: Diagnosis not present

## 2020-06-06 DIAGNOSIS — F319 Bipolar disorder, unspecified: Secondary | ICD-10-CM

## 2020-06-06 NOTE — Telephone Encounter (Signed)
Will do.  Any advance information will be appreciated.

## 2020-06-06 NOTE — Telephone Encounter (Signed)
Noted. Chart history on file. Will also discuss prior to appointment.

## 2020-06-06 NOTE — Progress Notes (Signed)
Tiffany [name] [id number] 1950-06-24 70 y.o.  Subjective:   Patient ID:  Tiffany [name] is a 70 y.o. (DOB 1950-05-23) female.  Chief Complaint: No chief complaint on file.   HPI Blyss Lugar Choyce presents to the office today for follow-up of PTSD, insomnia, GAD, BPD 1.  Describes mood today as "ok". Pleasant. Tearful at times. Mood symptoms - reports some depression over the past 5 days. Frustrated about not being able to find a therapist. Struggling with "PTSD" symptoms. Feels anxious at times, but is able to manage it with the Lorazeapam. Feeling irritable when triggered by something "unexpected". Denies mania. Decreased interest and motivation. Taking medications as prescribed. Energy levels stable. Active, walking 4 days a week. Enjoys some usual interests and activities. Married. Lives with husband their daughter. Talking to family and friends.  Appetite stable. Weight stable. Sleeping well most nights. Averages 8 or more hours.  Focus and concentration stable. Completing tasks - driving more. Managing some aspects of household.   Denies SI or HI. Passive thoughts without intent - will let family no if symptoms worsen. Denies AH or VH.   Previous medications: Celexa, Zyprexa, Tegretol, Depakote, Serzone, Topamax, Seroquel, Effexor, Lexapro, Desipramine, Neurontin, Abilify, Geodon, Propanolol, Cymbalta, Cogentin, Trihexyphenadyl, Sinmmet, Provigil, Selegiline, Requip, Amantadine, Prozac, Mirapex, Azilect, Metoclopramide, Baclofen, Artane, Namenda, Latuda.     GAD-7   Flowsheet Row Office Visit from 05/14/2019 in [facility]  Total GAD-7 Score 21    [place] Office Visit from 06/07/2019 in [facility]  Total Score (max 30 points ) 29    PHQ2-9   [place] Visit from 06/05/2020 in [facility] from 03/16/2020 in [facility] Visit from 09/16/2019 in  [facility] Visit from 08/24/2019 in [facility] Visit from 05/14/2019 in [facility]  PHQ-2 Total Score 4 2 0 0 1  PHQ-9 Total Score 11 3 -- 4 8    [facility] from 09/08/2019 in [facility] ED from 09/04/2019 in [facility]  C-SSRS RISK CATEGORY High Risk High Risk       Review of Systems:  Review of Systems  Musculoskeletal: Negative for gait problem.  Neurological: Negative for tremors.  Psychiatric/Behavioral:       Please refer to HPI    Medications: I have reviewed the patient's current medications.  Current Outpatient Medications  Medication Sig Dispense Refill  . amLODipine (NORVASC) 2.5 MG tablet TAKE 1 TABLET(2.5 MG) BY MOUTH DAILY 90 tablet 0  . calcitRIOL (ROCALTROL) 0.25 MCG capsule Take 1 capsule (0.25 mcg total) by mouth every Monday, Wednesday, and Friday. 12 capsule 0  . Certolizumab Pegol (CIMZIA) 2 X 200 MG KIT     . diphenhydrAMINE (BENADRYL) 50 MG tablet Take 50 mg by mouth at bedtime as needed for itching.    . lamoTRIgine (LAMICTAL) 200 MG tablet Take 1 tablet (200 mg total) by mouth at bedtime. 90 tablet 3  . levothyroxine (SYNTHROID) 112 MCG tablet TAKE 1 TABLET(112 MCG) BY MOUTH DAILY AT 6 AM 90 tablet 0  . lithium carbonate 300 MG capsule TAKE 1 CAPSULE(300 MG) BY MOUTH AT BEDTIME 90 capsule 3  . LORazepam (ATIVAN) 1 MG tablet Take 1 tablet (1 mg total) by mouth 2 (two) times daily. (Patient taking differently: Take 1 mg by mouth 2 (two) times daily as needed.) 60 tablet 2  .  metroNIDAZOLE (METROGEL) 1 % gel Apply topically daily. 45 g 0  . nystatin ointment (MYCOSTATIN) Apply to affected area 1-2 times daily (Patient not taking: Reported on 06/05/2020) 30 g 0   No current facility-administered medications for this visit.    Medication Side Effects: None  Allergies:  Allergies  Allergen Reactions  .  Aripiprazole Other (See Comments)    Parkinsonism     . Lactose Intolerance (Gi) Diarrhea  . Methotrexate Other (See Comments)    Hair loss, severe stomatitis    . Cefdinir Diarrhea    Other reaction(s): Diarrhea Yeast infection and fever; negative c diff  . Etanercept Other (See Comments)    Headaches    . Exemestane Other (See Comments)    Suicidal thoughts with medication    . Fluoxetine Other (See Comments)    Parkinsonism  . Methylprednisolone Sodium Succ Other (See Comments)    Agitated mania  . Epinephrine Palpitations    tachycardia   . Nitrofurantoin Nausea And Vomiting and Rash         Past Medical History:  Diagnosis Date  . Bipolar 1 disorder ([place])   . Breast cancer ([place])   . Chronic diarrhea    loose stools twice a day on average for years  . Chronic kidney disease (CKD), stage III (moderate) (HCC)   . Depression 1987  . Hospitalization or health care facility admission within last 6 months 04/2019   for fall/seizures  . Malignant neoplasm of overlapping sites of left breast in female, estrogen receptor positive ([place]) 03/14/2016   Dx in 09/2014, s/p bilateral mastectomies and ALND, 0/10 LN. 1.4 cm  Grade I invasive lobular, ER and PR +/Her--, Ki 67 <5% Tried anastrozole for one month, but developed suicidal idea  . Osteoarthritis, knee 09/24/2019   Xray 09/2019  . Parkinson's disease ([place]) 2012  . Psoriatic arthritis ([place])   . PTSD (post-traumatic stress disorder)   . Secondary hyperparathyroidism ([place])   . Tardive dyskinesia     Family History  Problem Relation Age of Onset  . Cancer Mother   . Cancer Sister   . Stroke Maternal Grandfather   . Diabetes Paternal Grandfather   . Cancer Sister     Social History   Socioeconomic History  . Marital status: Married    Spouse name: Not on file  . Number of children: Not on file  . Years of education: Not on file  . Highest education level: Not on file  Occupational History  . Occupation: retired   Tobacco Use  . Smoking status: Never Smoker  . Smokeless tobacco: Never Used  Vaping Use  . Vaping Use: Never used  Substance and Sexual Activity  . Alcohol use: Never  . Drug use: Never  . Sexual activity: Not Currently  Other Topics Concern  . Not on file  Social History Narrative   Moved to area from [place] 08/2018   Lives one story home   Right handed.   Social Determinants of Health   Financial Resource Strain: Low Risk   . Difficulty of Paying Living Expenses: Not hard at all  Food Insecurity: No Food Insecurity  . Worried About Charity fundraiser in the Last Year: Never true  . Ran Out of Food in the Last Year: Never true  Transportation Needs: No Transportation Needs  . Lack of Transportation (Medical): No  . Lack of Transportation (Non-Medical): No  Physical Activity: Inactive  . Days of Exercise per Week: 0 days  .  Minutes of Exercise per Session: 0 min  Stress: Stress Concern Present  . Feeling of Stress : To some extent  Social Connections: Moderately Integrated  . Frequency of Communication with Friends and Family: More than three times a week  . Frequency of Social Gatherings with Friends and Family: Three times a week  . Attends Religious Services: 1 to 4 times per year  . Active Member of Clubs or Organizations: No  . Attends Archivist Meetings: Never  . Marital Status: Married  Human resources officer Violence: Not At Risk  . Fear of Current or Ex-Partner: No  . Emotionally Abused: No  . Physically Abused: No  . Sexually Abused: No    Past Medical History, Surgical history, Social history, and Family history were reviewed and updated as appropriate.   Please see review of systems for further details on the patient's review from today.   Objective:   Physical Exam:  There were no vitals taken for this visit.  Physical Exam Constitutional:      General: She is not in acute distress. Musculoskeletal:        General: No deformity.   Neurological:     Mental Status: She is alert and oriented to person, place, and time.     Coordination: Coordination normal.  Psychiatric:        Attention and Perception: Attention and perception normal. She does not perceive auditory or visual hallucinations.        Mood and Affect: Mood normal. Mood is not anxious or depressed. Affect is not labile, blunt, angry or inappropriate.        Speech: Speech normal.        Behavior: Behavior normal.        Thought Content: Thought content normal. Thought content is not paranoid or delusional. Thought content does not include homicidal or suicidal ideation. Thought content does not include homicidal or suicidal plan.        Cognition and Memory: Cognition and memory normal.        Judgment: Judgment normal.     Comments: Insight intact     Lab Review:     Component Value Date/Time   NA 140 03/21/2020 0000   NA 138 06/08/2019 0000   K 4.5 03/21/2020 0000   CL 109 03/21/2020 0000   CO2 22 03/21/2020 0000   GLUCOSE 92 03/21/2020 0000   BUN 27 (H) 03/21/2020 0000   BUN 29 (A) 06/08/2019 0000   CREATININE 2.2 (A) 04/10/2020 0000   CREATININE 1.99 (H) 03/21/2020 0000   CALCIUM 9.7 03/21/2020 0000   CALCIUM 9.7 03/21/2020 0000   PROT 7.0 12/03/2019 1105   ALBUMIN 4.0 09/10/2019 0851   AST 16 04/10/2020 0000   ALT 13 04/10/2020 0000   ALKPHOS 145 (A) 04/10/2020 0000   BILITOT 1.0 12/03/2019 1105   GFRNONAA 22 04/10/2020 0000   GFRAA 25 (L) 09/13/2019 1243       Component Value Date/Time   WBC 12.0 04/10/2020 0000   WBC 13.0 (H) 09/08/2019 1347   RBC 4.95 09/08/2019 1347   HGB 14.4 09/08/2019 1347   HCT 46.0 09/08/2019 1347   PLT 220 09/08/2019 1347   MCV 92.9 09/08/2019 1347   MCH 29.1 09/08/2019 1347   MCHC 31.3 09/08/2019 1347   RDW 13.1 09/08/2019 1347   LYMPHSABS 2.5 04/27/2019 0544   MONOABS 0.5 04/27/2019 0544   EOSABS 0.2 04/27/2019 0544   BASOSABS 0.0 04/27/2019 0544    Lithium Lvl  Date  Value Ref Range Status   03/21/2020 0.6 0.6 - 1.2 mmol/L Final     No results found for: PHENYTOIN, PHENOBARB, VALPROATE, CBMZ   .res Assessment: Plan:    Plan:  Refer to [facility] [place]   Lorazepam 65m BID for anxiety - make take one tablet extra for severe anxiety symptoms. Lamictal 2031mat hs Lithium 30035maily   Labs - lithium level, TSH, CMP Reviewed for January draw  RTC 4 weeks  Counseled patient regarding potential benefits, risks, and side effects of Lamictal to include potential risk of Stevens-Johnson syndrome. Advised patient to stop taking Lamictal and contact office immediately if rash develops and to seek urgent medical attention if rash is severe and/or spreading quickly.  Discussed potential benefits, risk, and side effects of benzodiazepines to include potential risk of tolerance and dependence, as well as possible drowsiness.  Advised patient not to drive if experiencing drowsiness and to take lowest possible effective dose to minimize risk of dependence and tolerance.   Diagnoses and all orders for this visit:  Bipolar I disorder (HCCExmoreGeneralized anxiety disorder  PTSD (post-traumatic stress disorder)  Insomnia, unspecified type     Please see After Visit Summary for patient specific instructions.  Future Appointments  Date Time Provider DepClarkedale/30/2022  3:15 PM EvaEdrick KinsPMConnecticutC[facility]  06/13/2020  2:30 PM NonBubba Halesril Ma L, PT [facility]  07/05/2020 11:40 AM Yakira Duquette, RegBerdie OgrenP CP-CP None  08/01/2020  4:00 PM AquCameron SprangD LBN-[facility] None  03/22/2021  2:30 PM [facility] HEALTH COACH [facility] P[facility]    No orders of the defined types were placed in this encounter.   -------------------------------

## 2020-06-06 NOTE — Telephone Encounter (Signed)
Tiffany [name] just an [place] was scheduled with you as new pt. Ref by Deloria Lair was ASAP needed apt. Pt has one appt with Holly,due to insurance.  Usually notify you before hand so wanted to give you a heads up on this Pt.

## 2020-06-07 ENCOUNTER — Other Ambulatory Visit: Payer: Self-pay | Admitting: Physician Assistant

## 2020-06-07 ENCOUNTER — Ambulatory Visit: Payer: Medicare Other | Admitting: Podiatry

## 2020-06-07 MED ORDER — METRONIDAZOLE 0.75 % EX GEL: CUTANEOUS | 0 refills | Status: DC

## 2020-06-08 ENCOUNTER — Ambulatory Visit (INDEPENDENT_AMBULATORY_CARE_PROVIDER_SITE_OTHER): Payer: Medicare Other | Admitting: Psychiatry

## 2020-06-08 ENCOUNTER — Ambulatory Visit: Payer: Medicare Other | Admitting: Psychiatry

## 2020-06-08 ENCOUNTER — Other Ambulatory Visit: Payer: Self-pay

## 2020-06-08 DIAGNOSIS — F411 Generalized anxiety disorder: Secondary | ICD-10-CM

## 2020-06-08 DIAGNOSIS — G4700 Insomnia, unspecified: Secondary | ICD-10-CM

## 2020-06-08 DIAGNOSIS — F319 Bipolar disorder, unspecified: Secondary | ICD-10-CM | POA: Diagnosis not present

## 2020-06-08 DIAGNOSIS — F4310 Post-traumatic stress disorder, unspecified: Secondary | ICD-10-CM | POA: Diagnosis not present
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
## 2020-06-09 ENCOUNTER — Ambulatory Visit (HOSPITAL_BASED_OUTPATIENT_CLINIC_OR_DEPARTMENT_OTHER): Payer: Medicare Other | Admitting: Physical Therapy

## 2020-06-13 ENCOUNTER — Ambulatory Visit (HOSPITAL_BASED_OUTPATIENT_CLINIC_OR_DEPARTMENT_OTHER): Payer: Medicare Other | Attending: Physician Assistant | Admitting: Physical Therapy

## 2020-06-13 ENCOUNTER — Other Ambulatory Visit: Payer: Self-pay

## 2020-06-13 DIAGNOSIS — M6281 Muscle weakness (generalized): Secondary | ICD-10-CM | POA: Insufficient documentation

## 2020-06-13 DIAGNOSIS — M25652 Stiffness of left hip, not elsewhere classified: Secondary | ICD-10-CM

## 2020-06-13 DIAGNOSIS — M25552 Pain in left hip: Secondary | ICD-10-CM | POA: Diagnosis present

## 2020-06-13 DIAGNOSIS — R2681 Unsteadiness on feet: Secondary | ICD-10-CM | POA: Diagnosis present

## 2020-06-13 DIAGNOSIS — R2689 Other abnormalities of gait and mobility: Secondary | ICD-10-CM

## 2020-06-13 NOTE — Therapy (Signed)
[facility] [address] [place], [name], [address] Phone: [phone]   Fax:  [phone]  Physical Therapy Treatment, Progress Note, and Discharge  Patient Details  Name: Tiffany [name] MRN: [id number] Date of Birth: [birth year] Referring Provider (PT): Inda Coke, [place]   Progress Note Reporting Period 05/08/20 to 06/13/2020  See note below for Objective Data and Assessment of Progress/Goals.    PHYSICAL THERAPY DISCHARGE SUMMARY  Visits from Start of Care: 10  Current functional level related to goals / functional outcomes: Pt has met all current goals for her L hip pain.   Remaining deficits: Pt continues to have R quad weakness, decreased balance and endurance. Pt's hip pain has been resolved;    Education / Equipment: Discussed new PT order as needed to continue to work and progress her towards her new personal goals  Plan: Patient agrees to discharge.  Patient goals were met. Patient is being discharged due to meeting the stated rehab goals.  ?????         Encounter Date: 06/13/2020   PT End of Session - 06/13/20 1512    Visit Number 10    Number of Visits 12    Date for PT Re-Evaluation 06/02/20    Authorization Type Medicare    Progress Note Due on Visit 10    PT Start Time 1430    PT Stop Time 1515    PT Time Calculation (min) 45 min    Activity Tolerance Patient tolerated treatment well    Behavior During Therapy WFL for tasks assessed/performed           Past Medical History:  Diagnosis Date  . Bipolar 1 disorder ([place])   . Breast cancer ([place])   . Chronic diarrhea    loose stools twice a day on average for years  . Chronic kidney disease (CKD), stage III (moderate) (HCC)   . Depression 1987  . Hospitalization or health care facility admission within last 6 months 04/2019   for fall/seizures  . 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 1.4 cm  Grade I invasive lobular, ER and PR +/Her--, Ki 67 <5% Tried anastrozole for one month, but developed suicidal idea  . Osteoarthritis, knee 09/24/2019   Xray 09/2019  . Parkinson's disease ([place]) 2012  . Psoriatic arthritis ([place])   . PTSD (post-traumatic stress disorder)   . Secondary hyperparathyroidism ([place])   . Tardive dyskinesia     Past Surgical History:  Procedure Laterality Date  . ABDOMINAL HYSTERECTOMY  1987  . CHOLECYSTECTOMY  1979  . DILATION AND CURETTAGE OF UTERUS  1973  . MASTECTOMY Bilateral 09/19/2014  . TONSILLECTOMY  1970  . URETERAL REIMPLANTION Bilateral 1974    There were no vitals filed for this visit.   Subjective Assessment - 06/13/20 1439    Subjective Pt reports waking up with swelling this morning. She states it has improved.    Limitations Standing;Walking;House hold activities    How long can you sit comfortably? no issues    How long can you stand comfortably? Could be immediate or an hour. For sure after an hour.    How long can you walk comfortably? Could be    Patient Stated Goals Getting stronger, improving balance, pain free    Currently in Pain? No/denies    Pain Onset More than a month ago  Pain Onset More than a month ago                             [facility] Adult PT Treatment/Exercise - 06/13/20 0001      Ambulation/Gait   Ambulation Distance (Feet) 795 Feet   6 minutes   Assistive device None    Gait Pattern Step-through pattern;Lateral trunk lean to left    Ambulation Surface Level;Indoor      Lumbar Exercises: Machines for Strengthening   Other Lumbar Machine Exercise Shuttle 100# DL 2x10; SL 2x10      Lumbar Exercises: Supine   Straight Leg Raise 20 reps    Straight Leg Raises Limitations 2lb    Other Supine Lumbar Exercises Knees 90/90 alternating foot tap down with PPT 2x10                  PT Education - 06/13/20  1704    Education Details Discussed getting new PT order to work on her R knee, balance, and endurance for pt's new self goals.    Person(s) Educated Patient;Spouse    Methods Explanation;Verbal cues    Comprehension Verbalized understanding;Verbal cues required            PT Short Term Goals - 05/24/20 1105      PT SHORT TERM GOAL #1   Title Pt to be independent with initial HEP    Time 4    Period Weeks    Status Achieved    Target Date 05/19/20      PT SHORT TERM GOAL #2   Title Pt to report decreased pain in L hip to 0-5/ 10 with activity    Time 4    Period Weeks    Status Achieved    Target Date 05/19/20      PT SHORT TERM GOAL #3   Title Pt will be able to maintain SLS with no pain for at least 10 sec bilat    Baseline 5-8 sec tolerated; 5 sec on R, 12 sec on L    Time 4    Period Weeks    Status Partially Met    Target Date 05/19/20             PT Long Term Goals - 06/13/20 1445      PT LONG TERM GOAL #1   Title Pt to be independent with final HEP for hip, back, knee and mobility    Time 6    Period Weeks    Status Achieved      PT LONG TERM GOAL #2   Title Pt to report decreased pain in L low back, hip , and LE to 0-3/10 with activity    Baseline Pt reports "tiredness"/fatigue more than pain    Time 6    Period Weeks    Status Achieved      PT LONG TERM GOAL #3   Title Pt to demo improved hip and quad strength to at least 4+/5 to improve stability and pain    Time 6    Period Weeks    Status Achieved      PT LONG TERM GOAL #4   Title Pt will be able to cook in the kitchen without having to rest due to pain    Time 6    Period Weeks    Status Achieved  Plan - 06/13/20 1702    Clinical Impression Statement Treatment session focused on finalizing pt's d/c for her L hip pain/bursitis. Pt able to amb 6 minutes without rollater or pain. Tolerated all exercises without hip pain. Pt has met all LTGs. Pt given final HEP to  continue her knee and hip strengthening as pt has goals to walk around her neighborhood without rollator.    Personal Factors and Comorbidities Comorbidity 1;Fitness    Comorbidities multiple pain locations, back, hip, knee, Decreased mobility post covid and hospitalization last year,    Examination-Activity Limitations Stand;Locomotion Level;Bend;Dressing;Squat;Stairs    Examination-Participation Restrictions Cleaning;Meal Prep;Yard Work;Community Activity;Shop;Laundry    Stability/Clinical Decision Making Evolving/Moderate complexity    Rehab Potential Good    PT Frequency 2x / week    PT Duration 6 weeks    PT Treatment/Interventions ADLs/Self Care Home Management;Cryotherapy;Electrical Stimulation;Ultrasound;Traction;Moist Heat;Iontophoresis 47m/ml Dexamethasone;DME Instruction;Gait training;Stair training;Functional mobility training;Therapeutic activities;Therapeutic exercise;Orthotic Fit/Training;Patient/family education;Neuromuscular re-education;Balance training;Manual techniques;Taping;Dry needling;Passive range of motion;Spinal Manipulations;Joint Manipulations;Aquatic Therapy;Vasopneumatic Device    PT Next Visit Plan Assess response to HEP. R ankle stretching and strengthening anterior tibialis. Continue to progress hip strengthening in standing/weight shifting. Progress wall squats. Gait training and balance/proprioception exercises.    PT Home Exercise Plan Access Code: [email or web address]   Consulted and Agree with Plan of Care Patient           Patient will benefit from skilled therapeutic intervention in order to improve the following deficits and impairments:  Abnormal gait,Pain,Increased muscle spasms,Decreased activity tolerance,Decreased endurance,Decreased range of motion,Decreased strength,Impaired flexibility,Difficulty walking,Decreased balance,Decreased safety awareness  Visit Diagnosis: Pain in left hip  Muscle weakness (generalized)  Stiffness of left hip, not elsewhere  classified  Unsteadiness on feet  Other abnormalities of gait and mobility     Problem List Patient Active Problem List   Diagnosis Date Noted  . Osteoarthritis, knee 09/24/2019  . CKD (chronic kidney disease) stage 4, GFR 15-29 ml/min (HCC) 09/24/2019  . Severe recurrent major depression without psychotic features ([place] 09/09/2019  . Bipolar I disorder, most recent episode depressed ([place]   . Transaminitis   . Essential hypertension   . Encephalopathy 04/26/2019  . Leukocytosis 04/21/2019  . Thrombocytopenia ([place] 09/04/2018  . Vitamin D deficiency 09/04/2018  . Psoriatic arthritis ([place] 09/04/2018  . Tubular adenoma of colon 05/28/2017  . History of colonic polyps 05/28/2017  . 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 Malignant neoplasm of overlapping sites of left breast in female, estrogen receptor positive ([place] 03/14/2016  . Chronic kidney disease, stage III (moderate) 01/19/2016  . Tardive dyskinesia 10/18/2015  . History of breast cancer left 2016 12/27/2014  . Osteopenia determined by x-ray 10/31/2014  . Rosacea 05/21/2007  . Bipolar affective disorder, mixed ([place] 08/16/2004  . Acquired hypothyroidism 04/03/1996    GDec[facility]rology Surgery CenterA[address]PT, DPT 06/13/2020, 5:04 PM  [facility] [address]Phone: [phone]  Fax:  [phone] Name: NGracey TolleMRN: [id number] of Birth: [birth year]

## 2020-06-19 ENCOUNTER — Encounter: Payer: Self-pay | Admitting: Physician Assistant

## 2020-06-19 ENCOUNTER — Other Ambulatory Visit: Payer: Self-pay | Admitting: Physician Assistant

## 2020-06-19 DIAGNOSIS — R5381 Other malaise: Secondary | ICD-10-CM

## 2020-06-20 ENCOUNTER — Ambulatory Visit: Payer: Medicare Other | Admitting: Physician Assistant

## 2020-07-05 ENCOUNTER — Other Ambulatory Visit: Payer: Self-pay

## 2020-07-05 ENCOUNTER — Encounter: Payer: Self-pay | Admitting: Adult Health

## 2020-07-05 ENCOUNTER — Ambulatory Visit (INDEPENDENT_AMBULATORY_CARE_PROVIDER_SITE_OTHER): Payer: Medicare Other | Admitting: Adult Health

## 2020-07-05 DIAGNOSIS — Z79899 Other long term (current) drug therapy: Secondary | ICD-10-CM | POA: Diagnosis not present

## 2020-07-05 DIAGNOSIS — F411 Generalized anxiety disorder: Secondary | ICD-10-CM | POA: Diagnosis not present

## 2020-07-05 DIAGNOSIS — F319 Bipolar disorder, unspecified: Secondary | ICD-10-CM

## 2020-07-05 DIAGNOSIS — F4310 Post-traumatic stress disorder, unspecified: Secondary | ICD-10-CM | POA: Diagnosis not present

## 2020-07-05 NOTE — Progress Notes (Signed)
Russia Scheiderer Blackstock [id number] 03-10-1951 70 y.o.  Subjective:   Patient ID:  Tiffany [name] is a 70 y.o. (DOB 1951/02/18) female.  Chief Complaint: No chief complaint on file.   HPI Tiffany [name] presents to the office today for follow-up of PTSD, insomnia, GAD, BPD 1.  Describes mood today as "ok". Pleasant. Tearful at times. Mood symptoms - reports depression, anxiety, and irritability. Stating "I'm not myself". Anger outbursts every day. Had one incident of hitting herself when frustrated. Increased response to normal stimuli. Mood swings frequently over past 3 weeks. Denies any known precipitants. Has established care with Dr. Rica Mote and has an upcoming therapy appointment. Possible mania - irritated, some increased spending - "outside of my normal realm". Decreased interest and motivation. Taking medications as prescribed.  Energy levels stable. Active, does not have a regular exercise routine.  Enjoys some usual interests and activities. Married. Lives with husband their daughter. Talking to family and friends.  Appetite increased Weight gain. Sleeping well most nights. Averages 8 or more hours.  Focus and concentration stable. Completing tasks - driving some. Managing some aspects of household.   Denies SI or HI.  Denies AH or VH.   Previous medications: Celexa, Zyprexa, Tegretol, Depakote, Serzone, Topamax, Seroquel, Effexor, Lexapro, Desipramine, Neurontin, Abilify, Geodon, Propanolol, Cymbalta, Cogentin, Trihexyphenadyl, Sinmmet, Provigil, Selegiline, Requip, Amantadine, Prozac, Mirapex, Azilect, Metoclopramide, Baclofen, Artane, Namenda, Latuda.     GAD-7   Flowsheet Row Office Visit from 05/14/2019 in [place]  Total GAD-7 Score 21    [place] Office Visit from 06/07/2019 in [facility] [place]  Total Score (max 30 points ) 29    PHQ2-9   [place] Visit from 06/05/2020 in [place] from 03/16/2020 in [facility] Visit from 09/16/2019 in [facility] Visit from 08/24/2019 in [facility] Visit from 05/14/2019 in [facility]  PHQ-2 Total Score 4 2 0 0 1  PHQ-9 Total Score 11 3 -- 4 8    [facility] from 09/08/2019 in [facility] ED from 09/04/2019 in [facility]  C-SSRS RISK CATEGORY High Risk High Risk       Review of Systems:  Review of Systems  Musculoskeletal: Negative for gait problem.  Neurological: Negative for tremors.  Psychiatric/Behavioral:       Please refer to HPI    Medications: I have reviewed the patient's current medications.  Current Outpatient Medications  Medication Sig Dispense Refill  . amLODipine (NORVASC) 2.5 MG tablet TAKE 1 TABLET(2.5 MG) BY MOUTH DAILY 90 tablet 0  . calcitRIOL (ROCALTROL) 0.25 MCG capsule Take 1 capsule (0.25 mcg total) by mouth every Monday, Wednesday, and Friday. 12 capsule 0  . Certolizumab Pegol (CIMZIA) 2 X 200 MG KIT     . diphenhydrAMINE (BENADRYL) 50 MG tablet Take 50 mg by mouth at bedtime as needed for itching.    . lamoTRIgine (LAMICTAL) 200 MG tablet Take 1 tablet (200 mg total) by mouth at bedtime. 90 tablet 3  . levothyroxine (SYNTHROID) 112 MCG tablet TAKE 1 TABLET(112 MCG) BY MOUTH DAILY AT 6 AM 90 tablet 0  . lithium carbonate 300 MG capsule TAKE 1 CAPSULE(300 MG) BY MOUTH AT BEDTIME 90 capsule 3  . LORazepam (ATIVAN) 1 MG tablet Take 1 tablet (1 mg total) by mouth 2 (two) times daily. (Patient taking differently: Take 1 mg  by mouth 2 (two) times daily as needed.) 60 tablet 2  . metroNIDAZOLE (METROGEL) 0.75 % gel Apply to face 1-2 times daily 45 g 0  . nystatin ointment (MYCOSTATIN) Apply to affected area 1-2 times daily (Patient not taking: Reported on 06/05/2020) 30 g 0   No current facility-administered  medications for this visit.    Medication Side Effects: None  Allergies:  Allergies  Allergen Reactions  . Aripiprazole Other (See Comments)    Parkinsonism     . Lactose Intolerance (Gi) Diarrhea  . Methotrexate Other (See Comments)    Hair loss, severe stomatitis    . Cefdinir Diarrhea    Other reaction(s): Diarrhea Yeast infection and fever; negative c diff  . Etanercept Other (See Comments)    Headaches    . Exemestane Other (See Comments)    Suicidal thoughts with medication    . Fluoxetine Other (See Comments)    Parkinsonism  . Methylprednisolone Sodium Succ Other (See Comments)    Agitated mania  . Epinephrine Palpitations    tachycardia   . Nitrofurantoin Nausea And Vomiting and Rash         Past Medical History:  Diagnosis Date  . Bipolar 1 disorder ([facility])   . Breast cancer ([place])   . Chronic diarrhea    loose stools twice a day on average for years  . Chronic kidney disease (CKD), stage III (moderate) (HCC)   . Depression 1987  . Hospitalization or health care facility admission within last 6 months 04/2019   for fall/seizures  . 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 1.4 cm  Grade I invasive lobular, ER and PR +/Her--, Ki 67 <5% Tried anastrozole for one month, but developed suicidal idea  . Osteoarthritis, knee 09/24/2019   Xray 09/2019  . Parkinson's disease ([place]) 2012  . Psoriatic arthritis ([place])   . PTSD (post-traumatic stress disorder)   . Secondary hyperparathyroidism ([place])   . Tardive dyskinesia     Past Medical History, Surgical history, Social history, and Family history were reviewed and updated as appropriate.   Please see review of systems for further details on the patient's review from today.   Objective:   Physical Exam:  There were no vitals taken for this visit.  Physical Exam Constitutional:       General: She is not in acute distress. Musculoskeletal:        General: No deformity.  Neurological:     Mental Status: She is alert and oriented to person, place, and time.     Coordination: Coordination normal.  Psychiatric:        Attention and Perception: Attention and perception normal. She does not perceive auditory or visual hallucinations.        Mood and Affect: Mood normal. Mood is not anxious or depressed. Affect is not labile, blunt, angry or inappropriate.        Speech: Speech normal.        Behavior: Behavior normal.        Thought Content: Thought content normal. Thought content is not paranoid or delusional. Thought content does not include homicidal or suicidal ideation. Thought content does not include homicidal or suicidal plan.        Cognition and Memory: Cognition and memory normal.        Judgment: Judgment normal.     Comments: Insight intact  Lab Review:     Component Value Date/Time   NA 140 03/21/2020 0000   NA 138 06/08/2019 0000   K 4.5 03/21/2020 0000   CL 109 03/21/2020 0000   CO2 22 03/21/2020 0000   GLUCOSE 92 03/21/2020 0000   BUN 27 (H) 03/21/2020 0000   BUN 29 (A) 06/08/2019 0000   CREATININE 2.2 (A) 04/10/2020 0000   CREATININE 1.99 (H) 03/21/2020 0000   CALCIUM 9.7 03/21/2020 0000   CALCIUM 9.7 03/21/2020 0000   PROT 7.0 12/03/2019 1105   ALBUMIN 4.0 09/10/2019 0851   AST 16 04/10/2020 0000   ALT 13 04/10/2020 0000   ALKPHOS 145 (A) 04/10/2020 0000   BILITOT 1.0 12/03/2019 1105   GFRNONAA 22 04/10/2020 0000   GFRAA 25 (L) 09/13/2019 1243       Component Value Date/Time   WBC 12.0 04/10/2020 0000   WBC 13.0 (H) 09/08/2019 1347   RBC 4.95 09/08/2019 1347   HGB 14.4 09/08/2019 1347   HCT 46.0 09/08/2019 1347   PLT 220 09/08/2019 1347   MCV 92.9 09/08/2019 1347   MCH 29.1 09/08/2019 1347   MCHC 31.3 09/08/2019 1347   RDW 13.1 09/08/2019 1347   LYMPHSABS 2.5 04/27/2019 0544   MONOABS 0.5 04/27/2019 0544   EOSABS 0.2  04/27/2019 0544   BASOSABS 0.0 04/27/2019 0544    Lithium Lvl  Date Value Ref Range Status  03/21/2020 0.6 0.6 - 1.2 mmol/L Final     No results found for: PHENYTOIN, PHENOBARB, VALPROATE, CBMZ   .res Assessment: Plan:     Plan:  Therapist - Andy Mitchum   Lorazepam 79m BID for anxiety - make take one tablet extra for severe anxiety symptoms. Lamictal 2022mat hs Lithium 30032maily   Labs - lithium level, TSH, CMP Reviewed for January draw  RTC 4 weeks  Counseled patient regarding potential benefits, risks, and side effects of Lamictal to include potential risk of Stevens-Johnson syndrome. Advised patient to stop taking Lamictal and contact office immediately if rash develops and to seek urgent medical attention if rash is severe and/or spreading quickly.  Discussed potential benefits, risk, and side effects of benzodiazepines to include potential risk of tolerance and dependence, as well as possible drowsiness.  Advised patient not to drive if experiencing drowsiness and to take lowest possible effective dose to minimize risk of dependence and tolerance.     Diagnoses and all orders for this visit:  High risk medication use -     TSH -     Basic metabolic panel -     Lithium level  Bipolar I disorder (HCC)  Generalized anxiety disorder  PTSD (post-traumatic stress disorder)     Please see After Visit Summary for patient specific instructions.  Future Appointments  Date Time Provider DepDowney/28/2022  2:00 PM MitBlanchie ServehD CP-CP None  07/13/2020  3:00 PM MitBlanchie ServehD CP-CP None  07/20/2020  2:00 PM MitBlanchie ServehD CP-CP None  08/01/2020  4:00 PM AquCameron SprangD LBN-LBNG None  08/02/2020 10:40 AM Jeryl Umholtz, RegBerdie OgrenP CP-CP None  08/03/2020  1:00 PM MitBlanchie ServehD CP-CP None  03/22/2021  2:30 PM [facility] HEALTH COACH [facility] PEC    Orders Placed This Encounter  Procedures  . TSH  . Basic metabolic panel  .  Lithium level    -------------------------------

## 2020-07-06 ENCOUNTER — Ambulatory Visit (INDEPENDENT_AMBULATORY_CARE_PROVIDER_SITE_OTHER): Payer: Medicare Other | Admitting: Psychiatry

## 2020-07-06 DIAGNOSIS — G4700 Insomnia, unspecified: Secondary | ICD-10-CM | POA: Diagnosis not present

## 2020-07-06 DIAGNOSIS — F4310 Post-traumatic stress disorder, unspecified: Secondary | ICD-10-CM

## 2020-07-06 DIAGNOSIS — F319 Bipolar disorder, unspecified: Secondary | ICD-10-CM

## 2020-07-06 NOTE — Progress Notes (Signed)
Psychotherapy Progress Note [facility] [name], PhD LP  Patient ID: Libbie Bartley     MRN: [id number] Therapy format: Individual psychotherapy Date: 07/06/2020      Start: 2:20p     Stop: 3:10p     Time Spent: 50 min Location: In-person   Session narrative (presenting needs, interim history, self-report of stressors and symptoms, applications of prior therapy, status changes, and interventions made in session) Worry control technique helpful.  Saw psychiatry yesterday, labs ordered for thyroid and metabolic issues.  Strongest interest today to tame reactions to certain stimuli -- anger/yelling, hitting self, and feeling the world is coming apart, in response to things like the car stopping abruptly.  Explored learning history for being thrown around physically, interpreted conditioned amygdala response underlying seemingly excessive reactions.  Discussed representing herself to those who might need to know an collaborating with H to be prepared.  Husband [name] has a habit of intruding help, e.g., with computer & phone.  Finds it triggering, and right now ashamed of her reactions.    Validated as reflexes she/they are trying to modify, educated about action of the amygdala, framed ways of approaching [name] about collaborating to reduce triggers (asking consent to touch or to handle items, or to look over her shoulder).  Framed a method of "do-overs" for reactions, e.g., her slamming the phone after he gave it back to her.  Discussed option to practice ahead of time how she would like to react to hard braking, with a trip coming up this weekend, including both valued, socially acceptable ideas (e.g., "Whoa...") and contrasted with ludicrous or socially unacceptable ones (e.g., knife [name], call him names).    Therapeutic modalities: Cognitive Behavioral Therapy, Solution-Oriented/Positive Psychology and Ego-Supportive  Mental Status/Observations:  Appearance:   Casual      Behavior:  Appropriate  Motor:  Normal  Speech/Language:   Clear and Coherent  Affect:  Appropriate  Mood:  anxious and dysthymic, responsive  Thought process:  normal  Thought content:    WNL  Sensory/Perceptual disturbances:    WNL  Orientation:  Fully oriented  Attention:  Good    Concentration:  Good  Memory:  WNL  Insight:    Fair  Judgment:   Good  Impulse Control:  Good   Risk Assessment: Danger to Self: No Self-injurious Behavior: No Danger to Others: No Physical Aggression / Violence: No Duty to Warn: No Access to Firearms a concern: No  Assessment of progress:  progressing  Diagnosis:   ICD-10-CM   1. PTSD (post-traumatic stress disorder)  F43.10   2. Bipolar I disorder ([place])  F31.9   3. Insomnia, unspecified type  G47.00    Plan:  . Share strategies with H for detoxifying reactions to startle . Practice more positive response to startle riding in the car ("Whoa") and facetious ideas as desired . Other recommendations/advice as may be noted above . Continue to utilize previously learned skills ad lib . Maintain medication as prescribed and work faithfully with relevant prescriber(s) if any changes are desired or seem indicated . Call the clinic on-call service, present to ER, or call 911 if any life-threatening psychiatric crisis Return for session(s) already scheduled. . Already scheduled visit in this office 07/13/2020.  [name], PhD [name], PhD LP Clinical Psychologist, [facility] [address] [place], [place] [address] [phone]

## 2020-07-07 LAB — BASIC METABOLIC PANEL
BUN/Creatinine Ratio: 13 (calc) (ref 6–22)
BUN: 27 mg/dL — ABNORMAL HIGH (ref 7–25)
CO2: 23 mmol/L (ref 20–32)
Calcium: 9.4 mg/dL (ref 8.6–10.4)
Chloride: 109 mmol/L (ref 98–110)
Creat: 2.13 mg/dL — ABNORMAL HIGH (ref 0.60–0.93)
Glucose, Bld: 96 mg/dL (ref 65–99)
Potassium: 4.5 mmol/L (ref 3.5–5.3)
Sodium: 141 mmol/L (ref 135–146)

## 2020-07-07 LAB — LITHIUM LEVEL: Lithium Lvl: 0.6 mmol/L (ref 0.6–1.2)

## 2020-07-07 LAB — TSH: TSH: 3.98 mIU/L (ref 0.40–4.50)

## 2020-07-13 ENCOUNTER — Ambulatory Visit (INDEPENDENT_AMBULATORY_CARE_PROVIDER_SITE_OTHER): Payer: Medicare Other | Admitting: Psychiatry

## 2020-07-13 ENCOUNTER — Other Ambulatory Visit: Payer: Self-pay

## 2020-07-13 DIAGNOSIS — F411 Generalized anxiety disorder: Secondary | ICD-10-CM | POA: Diagnosis not present

## 2020-07-13 DIAGNOSIS — F319 Bipolar disorder, unspecified: Secondary | ICD-10-CM | POA: Diagnosis not present

## 2020-07-13 DIAGNOSIS — F4310 Post-traumatic stress disorder, unspecified: Secondary | ICD-10-CM | POA: Diagnosis not present

## 2020-07-13 DIAGNOSIS — G4700 Insomnia, unspecified: Secondary | ICD-10-CM | POA: Diagnosis not present

## 2020-07-13 DIAGNOSIS — Z62820 Parent-biological child conflict: Secondary | ICD-10-CM

## 2020-07-13 NOTE — Progress Notes (Signed)
Psychotherapy Progress Note [facility] [name], PhD LP  Patient ID: Tiffany [name]     MRN: [id number] Therapy format: Individual psychotherapy Date: 07/13/2020      Start: 3:15p     Stop: 4:00p     Time Spent: 45 min Location: In-person   Session narrative (presenting needs, interim history, self-report of stressors and symptoms, applications of prior therapy, status changes, and interventions made in session) Wound up not being able to tolerate the anxiety of traveling, as hoped, but appreciated the ideas.  Realized her high anxiety was all about whether her daughter Tiffany [name] was going to be overanxious and reactionary after she got there, and she just didn't feel ready to respond or cope with it if so.  Turns out long hx since junior high, starting with adolescent sullenness and noncommunicativeness.  Has come to expect Tiffany [name] to be angry, assume things like what she is there to help for.  Has confided anxiety about being judged, and has confided that her husband makes her feel put down as well.  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  Tiffany [name] knows she was in a suicidal, post-hysterectomy depression herself when Tiffany [name] was in early childhood and it likely affected her emotional development.  Knows Tiffany [name] has high self-expectations, both in her work and her childrearing.  Last year Tiffany [name] had a bleeding episode, too, Tiffany [name] Tiffany [name] went up to help, and Tiffany [name] was supposed to do bed rest but got up and dressed for the airport (to pick up Tiffany [name]) -- apparently driving herself too hard to be normal -- and cursed [place] when kindly challenged if that was a good idea.  This trip last weekend would have been the first visit since that episode.    Normalized apprehension, reframed as opportunity next time she goes to retire an anxious memory.  Reviewed observations of daughter, posttraumatic triggers and drafted coping  thoughts, including good reasons it's not likely to happen again, good reasons to trust they have a relationship, and good reasons to trust she could take it if it did somehow happen again.    Other Tiffany [name] Tiffany [name] lives with Tiffany [name] and [name].  Possible rivalry issues there, not elaborated.  Therapeutic modalities: Cognitive Behavioral Therapy, Solution-Oriented/Positive Psychology and Ego-Supportive  Mental Status/Observations:  Appearance:   Casual     Behavior:  Appropriate  Motor:  Normal  Speech/Language:   clearing, more fluent  Affect:  Appropriate and better energy  Mood:  anxious and more responsive  Thought process:  normal  Thought content:    WNL  Sensory/Perceptual disturbances:    WNL  Orientation:  Fully oriented  Attention:  Good    Concentration:  Good  Memory:  WNL  Insight:    Fair  Judgment:   Good  Impulse Control:  Good   Risk Assessment: Danger to Self: No Self-injurious Behavior: No Danger to Others: No Physical Aggression / Violence: No Duty to Warn: No Access to Firearms a concern: No  Assessment of progress:  progressing  Diagnosis:   ICD-10-CM   1. Bipolar I disorder ([place])  F31.9   2. Generalized anxiety disorder  F41.1   3. PTSD (post-traumatic stress disorder)  F43.10   4. Insomnia, unspecified type  G47.00   5. Relationship problem between parent and child  Z62.820    Plan:  . Self-affirm explanations for anxiety states and rehearse good reasons why she can't be  shocked the same whenever she does go back . Keep handy, as needed, startle control measures from last time . Continue insomnia control measures from before . Other recommendations/advice as may be noted above . Continue to utilize previously learned skills ad lib . Maintain medication as prescribed and work faithfully with relevant prescriber(s) if any changes are desired or seem indicated . Call the clinic on-call service, present to ER, or call 911 if any life-threatening psychiatric  crisis Return for session(s) already scheduled. . Already scheduled visit in this office 07/20/2020.  [name], PhD [name], PhD LP Clinical Psychologist, [facility] [address] [place], [place] [address] [phone]

## 2020-07-20 ENCOUNTER — Ambulatory Visit (INDEPENDENT_AMBULATORY_CARE_PROVIDER_SITE_OTHER): Payer: Medicare Other | Admitting: Psychiatry

## 2020-07-20 ENCOUNTER — Other Ambulatory Visit: Payer: Self-pay

## 2020-07-20 DIAGNOSIS — R41840 Attention and concentration deficit: Secondary | ICD-10-CM | POA: Diagnosis not present

## 2020-07-20 DIAGNOSIS — F319 Bipolar disorder, unspecified: Secondary | ICD-10-CM | POA: Diagnosis not present

## 2020-07-20 DIAGNOSIS — U099 Post covid-19 condition, unspecified: Secondary | ICD-10-CM

## 2020-07-20 DIAGNOSIS — F4310 Post-traumatic stress disorder, unspecified: Secondary | ICD-10-CM | POA: Diagnosis not present

## 2020-07-20 DIAGNOSIS — F411 Generalized anxiety disorder: Secondary | ICD-10-CM | POA: Diagnosis not present

## 2020-07-20 NOTE — Progress Notes (Signed)
Psychotherapy Progress Note [facility] [name], PhD LP  Patient ID: Tiffany [name]     MRN: [id number] Therapy format: Individual psychotherapy Date: 07/20/2020      Start: 2:15p     Stop: 3:00p     Time Spent: 45 min Location: In-person   Session narrative (presenting needs, interim history, self-report of stressors and symptoms, applications of prior therapy, status changes, and interventions made in session) [name] called for Mother's Day, [name] answered, bad timing but nothing alarming or suggesting division.  Reviewed coping thoughts from last week, most notably reframing intimidating times as opportunities to get better results than what frightened or burned her before, and exercising trust in [place] to mean well.  Affirmed good news toward her ear of [name] reacting, and good sign toward next opportunity to interact with her.  Ongoing concern about fuzzy thinking and memory.  Reviewed having had COVID and then neuro rehab.  Challenge to solicit [facility] patience and understanding when she falters with memory or fluency.  Suggested a hand gesture ("whoa" signal) to practice as a way of lowering pressure both to speak and to take questions, with recommendations to discuss ahead of time, roleplay how they would like to handle dysfluency.  Notes startle response to loud noises, e.g., yesterday at [employer].  Had a minor outburst with cashier she realizes was for feeling pressure to get [name]'s skin out of the sun (skin cancer history).  Normalized, discussed alternatives if she had it to do over.  Therapeutic modalities: Cognitive Behavioral Therapy and Solution-Oriented/Positive Psychology  Mental Status/Observations:  Appearance:   Casual     Behavior:  Appropriate  Motor:  Normal  Speech/Language:   Clear and Coherent  Affect:  Appropriate  Mood:  anxious and dysthymic  Thought process:  normal  Thought content:    WNL  Sensory/Perceptual disturbances:     WNL  Orientation:  Fully oriented  Attention:  Fair    Concentration:  Good  Memory:  WNL  Insight:    Good  Judgment:   Good  Impulse Control:  Good   Risk Assessment: Danger to Self: No Self-injurious Behavior: No Danger to Others: No Physical Aggression / Violence: No Duty to Warn: No Access to Firearms a concern: No  Assessment of progress:  progressing  Diagnosis:   ICD-10-CM   1. Bipolar I disorder ([place])  F31.9   2. Generalized anxiety disorder  F41.1   3. PTSD (post-traumatic stress disorder)  F43.10   4. 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 Share "whoa" gesture idea with [name] and roleplay handling of dysfluency . For regrettable moments, imagine another response, seek "do-over" . Self-affirm that times she failed to speak or act to protect herself were very likely survival ("freeze") response  . Other recommendations/advice as may be noted above . Continue to utilize previously learned skills ad lib . Maintain medication as prescribed and work faithfully with relevant prescriber(s) if any changes are desired or seem indicated . Call the clinic on-call service, present to ER, or call 911 if any life-threatening psychiatric crisis Return for session(s) already scheduled, put on cancellation list. . Already scheduled visit in this office 08/02/2020.  [name], PhD [name], PhD LP Clinical Psychologist, [facility] [address] [place],  [address] [phone]

## 2020-07-23 ENCOUNTER — Encounter: Payer: Self-pay | Admitting: Physician Assistant

## 2020-07-26 ENCOUNTER — Ambulatory Visit (INDEPENDENT_AMBULATORY_CARE_PROVIDER_SITE_OTHER): Payer: Medicare Other | Admitting: Psychiatry

## 2020-07-26 ENCOUNTER — Other Ambulatory Visit: Payer: Self-pay

## 2020-07-26 DIAGNOSIS — U099 Post covid-19 condition, unspecified: Secondary | ICD-10-CM

## 2020-07-26 DIAGNOSIS — F4310 Post-traumatic stress disorder, unspecified: Secondary | ICD-10-CM | POA: Diagnosis not present

## 2020-07-26 DIAGNOSIS — F3176 Bipolar disorder, in full remission, most recent episode depressed: Secondary | ICD-10-CM

## 2020-07-26 DIAGNOSIS — R41840 Attention and concentration deficit: Secondary | ICD-10-CM | POA: Diagnosis not present

## 2020-07-26 DIAGNOSIS — F411 Generalized anxiety disorder: Secondary | ICD-10-CM

## 2020-07-26 DIAGNOSIS — F319 Bipolar disorder, unspecified: Secondary | ICD-10-CM
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
## 2020-08-01 ENCOUNTER — Other Ambulatory Visit: Payer: Self-pay

## 2020-08-01 ENCOUNTER — Ambulatory Visit (INDEPENDENT_AMBULATORY_CARE_PROVIDER_SITE_OTHER): Payer: Medicare Other | Admitting: Neurology

## 2020-08-01 ENCOUNTER — Encounter: Payer: Self-pay | Admitting: Neurology

## 2020-08-01 VITALS — BP 150/82 | HR 71 | Ht 64.0 in | Wt 231.6 lb

## 2020-08-01 DIAGNOSIS — R410 Disorientation, unspecified: Secondary | ICD-10-CM | POA: Diagnosis not present

## 2020-08-01 NOTE — Patient Instructions (Signed)
Good to see you doing better. Continue follow-up with your PCP and Behavioral Health team. Follow-up as needed, call for any changes

## 2020-08-01 NOTE — Progress Notes (Signed)
NEUROLOGY FOLLOW UP OFFICE NOTE  Tiffany [name] [id number] May 25, 1950  HISTORY OF PRESENT ILLNESS: I had the pleasure of seeing Tiffany [name] in follow-up in the neurology clinic on 08/01/2020.  The patient was last seen 7 months ago. She was admitted in February 2021 for confusion, tremors. EEG at that time showed diffuse slowing, at times with triphasic morphology, as well as sharp waves in the left temporoparietal region, at times periodic. She was in renal failure at that time as well. She was discharged on Lamotrigine 157m qhs (she was taking 3045mdaily for psychiatric issues) and low dose Levetiracetam 25030mID. Lamotrigine had been increased by her psychiatrist to 200m24ms. She and her husband deny any prior history of seizures or seizure-like symptoms, no clear epilepsy risk factors. She had a 24-hour EEG in 01/2020 which was normal. She weaned off Levetiracetam and has been off this for 6 months with no recurrence of similar symptoms since 04/2019. She and her husband deny any staring/unresponsive episodes, gaps in time, olfactory/gustatory hallucinations, focal numbness/tingling/weakness, myoclonic jerks. The tremors have resolved. She denies any headaches, dizziness, no falls. She had been back to driving but stopped again due to cataracts. She still has some mild memory issues such as saying "good morning" instead of "good afternoon," but nothing significant. She still gets easily stimulated but the increased irritability has been better off Levetiracetam.    History on Initial Assessment 06/07/2019: This is a pleasant 68 y70r old right-handed woman with a history of bipolar disorder, PTSD, drug-induced parkinsonism, recently admitted for confusion with abnormal EEG, presenting to establish care. She was [facility] for worsening gait, confusion, and tremors. She contracted COVID and symptoms significantly worsened, leading to a fall. She does not remember a lot of it, she  apparently fell and called out to her husband. Her husband reported waxing and waning confusion, seemingly worse at night. She had an MRI brain without contrast which I personally reviewed, no acute changes. 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 CBC showed a WBC of 17.8, acute on chronic renal failure creatinine 2.67. Her Lithium level was 0.87. Lamotrigine level 15.3.Her Synthroid and Lamotrigine were held due to concern this was contributing to tremors/encephalopathy. She had an EEG showing diffuse slowing, at times with triphasic morphology. There were also sharp waves in the left temporoparietal region, maximal at P7/P3, at times periodic for 10-15 seconds without clear evolution and no clinical changes seen. She was restarted on lower dose Lamotrigine 100mg51m (previously on 300mg 76my), and low dose Levetiracetam 250mg B90mas added. Renal failure resolved. Synthroid resumed at lower dose. Sertraline was added by psychiatry initially, then stopped to streamline medications. She had balance deficits and cognitive deficits with delayed processing and poor awareness of deficits.   She has a history of intermittent Lithium toxicity, she has been on this for at least 10 years. 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 She has tried benztropine, trihexyphenidyl, Sinemet, selegiline, Requip, Azilect, with minimal benefit. The tremors had significantly improved with medication changes made in the hospital, she now has minimal tremors. She is happy to report that she can now write legibly. She has a history of tardive dyskinesia and was started on Ingrezza. Initially they felt there was not much benefit after she missed a week due to insurance issues, but they report today that  her mouth movements have worsened and would like to resume Ingrezza. She has  been mostly dealing with a lot of pain since hospital discharge, she has severe arthritis pain in her feet, knees, wrist/elbow. Tramadol was not helping. She sees Rheumatology and had been on Cimzia which was also held. She reports pain has been daily but she is not in pain today. She still has loss of taste. She had a migraine this morning, she has a remote history of migraines and has not had any in over a year. She used to take Excedrin but had an itching reaction recently. Her husband denies any staring/unresponsive episodes. She denies any olfactory/gustatory hallucinations, deja vu, rising epigastric sensation, focal numbness/tingling/weakness, myoclonic jerks. She denies any dizziness, diplopia, dysarthria/dysphagia, focal numbness/tingling/weakness, bowel/bladder dysfunction. She has lorazepam $RemoveBefor'1mg'[id number]$  qhs and takes it as needed during the day for anxiety. She reports depression is better, she denies any suicidal ideation.  Epilepsy Risk Factors:  Her mother had seizures. Otherwise she had a normal birth and early development.  There is no history of febrile convulsions, CNS infections such as meningitis/encephalitis, significant traumatic brain injury, neurosurgical procedures   PAST MEDICAL HISTORY: Past Medical History:  Diagnosis Date  . Bipolar 1 disorder ([place])   . Breast cancer ([place])   . Chronic diarrhea    loose stools twice a day on average for years  . Chronic kidney disease (CKD), stage III (moderate) ([facility])   . Depression 1987  . Hospitalization or health care facility admission within last 6 months 04/2019   for fall/seizures  . Malignant neoplasm of overlapping sites of left breast in female, estrogen receptor positive ([place]) 03/14/2016   Dx in 09/2014, s/p bilateral mastectomies and ALND, 0/10 LN. 1.4 cm  Grade I invasive lobular, ER and PR +/Her--, Ki 67 <5% Tried anastrozole for one month, but developed suicidal idea  . Osteoarthritis, knee 09/24/2019   Xray 09/2019  . Parkinson's  disease ([place]) 2012  . Psoriatic arthritis ([place])   . PTSD (post-traumatic stress disorder)   . Secondary hyperparathyroidism ([place])   . Tardive dyskinesia     MEDICATIONS: Current Outpatient Medications on File Prior to Visit  Medication Sig Dispense Refill  . amLODipine (NORVASC) 2.5 MG tablet TAKE 1 TABLET(2.5 MG) BY MOUTH DAILY 90 tablet 0  . calcitRIOL (ROCALTROL) 0.25 MCG capsule Take 1 capsule (0.25 mcg total) by mouth every Monday, Wednesday, and Friday. 12 capsule 0  . Certolizumab Pegol (CIMZIA) 2 X 200 MG KIT     . diphenhydrAMINE (BENADRYL) 50 MG tablet Take 50 mg by mouth at bedtime as needed for itching.    . lamoTRIgine (LAMICTAL) 200 MG tablet Take 1 tablet (200 mg total) by mouth at bedtime. 90 tablet 3  . levothyroxine (SYNTHROID) 112 MCG tablet TAKE 1 TABLET(112 MCG) BY MOUTH DAILY AT 6 AM 90 tablet 0  . lithium carbonate 300 MG capsule TAKE 1 CAPSULE(300 MG) BY MOUTH AT BEDTIME 90 capsule 3  . LORazepam (ATIVAN) 1 MG tablet Take 1 tablet (1 mg total) by mouth 2 (two) times daily. (Patient taking differently: Take 1 mg by mouth 2 (two) times daily as needed.) 60 tablet 2  . metroNIDAZOLE (METROGEL) 0.75 % gel Apply to face 1-2 times daily 45 g 0  . nystatin ointment (MYCOSTATIN) Apply to affected area 1-2 times daily (Patient not taking: Reported on 06/05/2020) 30 g 0   No current facility-administered medications on file prior to visit.    ALLERGIES: Allergies  Allergen Reactions  .  Aripiprazole Other (See Comments)    Parkinsonism     . Lactose Intolerance (Gi) Diarrhea  . Methotrexate Other (See Comments)    Hair loss, severe stomatitis    . Cefdinir Diarrhea    Other reaction(s): Diarrhea Yeast infection and fever; negative c diff  . Etanercept Other (See Comments)    Headaches    . Exemestane Other (See Comments)    Suicidal thoughts with medication    . Fluoxetine Other (See Comments)    Parkinsonism  . Methylprednisolone Sodium Succ Other (See  Comments)    Agitated mania  . Epinephrine Palpitations    tachycardia   . Nitrofurantoin Nausea And Vomiting and Rash         FAMILY HISTORY: Family History  Problem Relation Age of Onset  . Cancer Mother   . Cancer Sister   . Stroke Maternal Grandfather   . Diabetes Paternal Grandfather   . Cancer Sister     SOCIAL HISTORY: Social History   Socioeconomic History  . Marital status: Married    Spouse name: Not on file  . Number of children: Not on file  . Years of education: Not on file  . Highest education level: Not on file  Occupational History  . Occupation: retired  Tobacco Use  . Smoking status: Never Smoker  . Smokeless tobacco: Never Used  Vaping Use  . Vaping Use: Never used  Substance and Sexual Activity  . Alcohol use: Never  . Drug use: Never  . Sexual activity: Not Currently  Other Topics Concern  . Not on file  Social History Narrative   Moved to area from [place] 08/2018   Lives one story home   Right handed.   Social Determinants of Health   Financial Resource Strain: Low Risk   . Difficulty of Paying Living Expenses: Not hard at all  Food Insecurity: No Food Insecurity  . Worried About Charity fundraiser in the Last Year: Never true  . Ran Out of Food in the Last Year: Never true  Transportation Needs: No Transportation Needs  . Lack of Transportation (Medical): No  . Lack of Transportation (Non-Medical): No  Physical Activity: Inactive  . Days of Exercise per Week: 0 days  . Minutes of Exercise per Session: 0 min  Stress: Stress Concern Present  . Feeling of Stress : To some extent  Social Connections: Moderately Integrated  . Frequency of Communication with Friends and Family: More than three times a week  . Frequency of Social Gatherings with Friends and Family: Three times a week  . Attends Religious Services: 1 to 4 times per year  . Active Member of Clubs or Organizations: No  . Attends Archivist Meetings: Never   . Marital Status: Married  Human resources officer Violence: Not At Risk  . Fear of Current or Ex-Partner: No  . Emotionally Abused: No  . Physically Abused: No  . Sexually Abused: No     PHYSICAL EXAM: Vitals:   08/01/20 1555  BP: (!) 150/82  Pulse: 71  SpO2: 99%   General: No acute distress Head:  Normocephalic/atraumatic Skin/Extremities: No rash, no edema Neurological Exam: alert and awake. No aphasia or dysarthria. Fund of knowledge is appropriate. Attention and concentration are normal. Cranial nerves: Pupils equal, round. Extraocular movements intact with no nystagmus. Visual fields full.  No facial asymmetry.  Motor: Bulk and tone normal, muscle strength 5/5 throughout with no pronator drift.   Finger to nose testing intact.  Gait narrow-based  and steady, no ataxia. No tremors.   IMPRESSION: This is a pleasant 70 yo RH woman with a history of bipolar disorder, PTSD, drug-induced parkinsonism, who had an episode of confusion with metabolic encephalopathy, EEG showed diffuse slowing with triphasic morphology, she was in renal failure at that time. There was also note of left temporoparietal sharp waves. 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 She had a repeat 24-hour EEG in November 2021 which was normal. No further similar episodes since February 2021. She has been off Levetiracetam for 6 months with no issues. She is on Lamotrigine for mood stabilization. Continue current medications and follow-up with Behavioral Health. She is aware of [place] driving laws to stop driving after an episode of loss of awareness until 6 months event-free. Follow-up as needed, call for any changes.    Thank you for allowing me to participate in her care.  Please do not hesitate to call for any questions or concerns.   Ellouise Newer, M.D.   CC: Inda Coke, [place]

## 2020-08-02 ENCOUNTER — Ambulatory Visit (INDEPENDENT_AMBULATORY_CARE_PROVIDER_SITE_OTHER): Payer: Medicare Other | Admitting: Adult Health

## 2020-08-02 ENCOUNTER — Encounter: Payer: Self-pay | Admitting: Adult Health

## 2020-08-02 DIAGNOSIS — F3176 Bipolar disorder, in full remission, most recent episode depressed: Secondary | ICD-10-CM | POA: Diagnosis not present

## 2020-08-02 DIAGNOSIS — F411 Generalized anxiety disorder: Secondary | ICD-10-CM | POA: Diagnosis not present

## 2020-08-02 DIAGNOSIS — G4700 Insomnia, unspecified: Secondary | ICD-10-CM | POA: Diagnosis not present

## 2020-08-02 DIAGNOSIS — F4310 Post-traumatic stress disorder, unspecified: Secondary | ICD-10-CM

## 2020-08-02 NOTE — Progress Notes (Signed)
Tiffany [name] [id number] 12/08/1950 70 y.o.  Subjective:   Patient ID:  Tiffany [name] is a 70 y.o. (DOB 1951-01-17) female.  Chief Complaint: No chief complaint on file.   HPI Tiffany [name] presents to the office today for follow-up of PTSD, insomnia, GAD, BPD 1.  Describes mood today as "ok". Pleasant. Denies tearfulness. Mood symptoms - denies depression, anxiety, and irritability. Denies anger outbursts. Mood is level. Stating "I'm doing good". Seeing Dr. Rica Mote weekly. Decreased interest and motivation. Taking medications as prescribed.  Energy levels stable. Active, does not have a regular exercise routine. Walking some days. Enjoys some usual interests and activities. Married. Lives with husband their daughter. Talking to family and friends.  Appetite adequate. Weight gain. Sleeping well most nights. Averages 8 or more hours.  Focus and concentration stable. Completing tasks. Managing some aspects of household. Retired.  Denies SI or HI.  Denies AH or VH.   Previous medications: Celexa, Zyprexa, Tegretol, Depakote, Serzone, Topamax, Seroquel, Effexor, Lexapro, Desipramine, Neurontin, Abilify, Geodon, Propanolol, Cymbalta, Cogentin, Trihexyphenadyl, Sinmmet, Provigil, Selegiline, Requip, Amantadine, Prozac, Mirapex, Azilect, Metoclopramide, Baclofen, Artane, Namenda, Latuda.     GAD-7   Flowsheet Row Office Visit from 05/14/2019 in [facility]  Total GAD-7 Score 21    [place] Office Visit from 06/07/2019 in [facility]  Total Score (max 30 points ) 29    PHQ2-9   Flowsheet Row Counselor from 07/26/2020 in [facility] Visit from 06/05/2020 in [facility] from 03/16/2020 in [facility] Visit from 09/16/2019 in [facility] Visit from 08/24/2019 in [facility]  PHQ-2 Total  Score _0 0 0  PHQ-9 Total Score -- 11 3 -- 4    Flowsheet Row ED from 09/08/2019 in [facility] ED from 09/04/2019 in [facility]  C-SSRS RISK CATEGORY High Risk High Risk       Review of Systems:  Review of Systems  Musculoskeletal: Negative for gait problem.  Neurological: Negative for tremors.  Psychiatric/Behavioral:       Please refer to HPI    Medications: I have reviewed the patient's current medications.  Current Outpatient Medications  Medication Sig Dispense Refill  . amLODipine (NORVASC) 2.5 MG tablet TAKE 1 TABLET(2.5 MG) BY MOUTH DAILY 90 tablet 0  . calcitRIOL (ROCALTROL) 0.25 MCG capsule Take 1 capsule (0.25 mcg total) by mouth every Monday, Wednesday, and Friday. 12 capsule 0  . Certolizumab Pegol (CIMZIA) 2 X 200 MG KIT     . cholecalciferol (VITAMIN D3) 25 MCG (1000 UNIT) tablet Take 1,000 Units by mouth daily.    . diphenhydrAMINE (BENADRYL) 50 MG tablet Take 50 mg by mouth at bedtime. Help with sleep    . lamoTRIgine (LAMICTAL) 200 MG tablet Take 1 tablet (200 mg total) by mouth at bedtime. 90 tablet 3  . levothyroxine (SYNTHROID) 112 MCG tablet TAKE 1 TABLET(112 MCG) BY MOUTH DAILY AT 6 AM 90 tablet 0  . lithium carbonate 300 MG capsule TAKE 1 CAPSULE(300 MG) BY MOUTH AT BEDTIME 90 capsule 3  . LORazepam (ATIVAN) 1 MG tablet Take 1 tablet (1 mg total) by mouth 2 (two) times daily. (Patient taking differently: Take 1 mg by mouth 2 (two) times daily as needed.) 60 tablet 2  . metroNIDAZOLE (METROGEL) 0.75 % gel Apply to face 1-2 times daily 45 g 0   No  current facility-administered medications for this visit.    Medication Side Effects: None  Allergies:  Allergies  Allergen Reactions  . Aripiprazole Other (See Comments)    Parkinsonism     . Lactose Intolerance (Gi) Diarrhea  . Methotrexate Other (See Comments)    Hair loss, severe stomatitis    . Cefdinir Diarrhea    Other  reaction(s): Diarrhea Yeast infection and fever; negative c diff  . Etanercept Other (See Comments)    Headaches    . Exemestane Other (See Comments)    Suicidal thoughts with medication    . Fluoxetine Other (See Comments)    Parkinsonism  . Methylprednisolone Sodium Succ Other (See Comments)    Agitated mania  . Epinephrine Palpitations    tachycardia   . Nitrofurantoin Nausea And Vomiting and Rash         Past Medical History:  Diagnosis Date  . Bipolar 1 disorder ([place])   . Breast cancer ([place])   . Chronic diarrhea    loose stools twice a day on average for years  . Chronic kidney disease (CKD), stage III (moderate) (HCC)   . Depression 1987  . Hospitalization or health care facility admission within last 6 months 04/2019   for fall/seizures  . Malignant neoplasm of overlapping sites of left breast in female, estrogen receptor positive ([place]) 03/14/2016   Dx in 09/2014, s/p bilateral mastectomies and ALND, 0/10 LN. 1.4 cm  Grade I invasive lobular, ER and PR +/Her--, Ki 67 <5% Tried anastrozole for one month, but developed suicidal idea  . Osteoarthritis, knee 09/24/2019   Xray 09/2019  . Parkinson's disease ([place]) 2012  . Psoriatic arthritis ([place])   . PTSD (post-traumatic stress disorder)   . Secondary hyperparathyroidism ([place])   . Tardive dyskinesia     Past Medical History, Surgical history, Social history, and Family history were reviewed and updated as appropriate.   Please see review of systems for further details on the patient's review from today.   Objective:   Physical Exam:  There were no vitals taken for this visit.  Physical Exam Constitutional:      General: She is not in acute distress. Musculoskeletal:        General: No deformity.  Neurological:     Mental Status: She is alert and oriented to person, place, and time.     Coordination: Coordination normal.  Psychiatric:        Attention and Perception: Attention and perception normal. She does  not perceive auditory or visual hallucinations.        Mood and Affect: Mood normal. Mood is not anxious or depressed. Affect is not labile, blunt, angry or inappropriate.        Speech: Speech normal.        Behavior: Behavior normal.        Thought Content: Thought content normal. Thought content is not paranoid or delusional. Thought content does not include homicidal or suicidal ideation. Thought content does not include homicidal or suicidal plan.        Cognition and Memory: Cognition and memory normal.        Judgment: Judgment normal.     Comments: Insight intact     Lab Review:     Component Value Date/Time   NA 141 07/06/2020 0738   NA 138 06/08/2019 0000   K 4.5 07/06/2020 0738   CL 109 07/06/2020 0738   CO2 23 07/06/2020 0738   GLUCOSE 96 07/06/2020 0738   BUN 27 (  H) 07/06/2020 0738   BUN 29 (A) 06/08/2019 0000   CREATININE 2.13 (H) 07/06/2020 0738   CALCIUM 9.4 07/06/2020 0738   PROT 7.0 12/03/2019 1105   ALBUMIN 4.0 09/10/2019 0851   AST 16 04/10/2020 0000   ALT 13 04/10/2020 0000   ALKPHOS 145 (A) 04/10/2020 0000   BILITOT 1.0 12/03/2019 1105   GFRNONAA 22 04/10/2020 0000   GFRAA 25 (L) 09/13/2019 1243       Component Value Date/Time   WBC 12.0 04/10/2020 0000   WBC 13.0 (H) 09/08/2019 1347   RBC 4.95 09/08/2019 1347   HGB 14.4 09/08/2019 1347   HCT 46.0 09/08/2019 1347   PLT 220 09/08/2019 1347   MCV 92.9 09/08/2019 1347   MCH 29.1 09/08/2019 1347   MCHC 31.3 09/08/2019 1347   RDW 13.1 09/08/2019 1347   LYMPHSABS 2.5 04/27/2019 0544   MONOABS 0.5 04/27/2019 0544   EOSABS 0.2 04/27/2019 0544   BASOSABS 0.0 04/27/2019 0544    Lithium Lvl  Date Value Ref Range Status  07/06/2020 0.6 0.6 - 1.2 mmol/L Final     No results found for: PHENYTOIN, PHENOBARB, VALPROATE, CBMZ   .res Assessment: Plan:    Plan:  Therapist - Andy Mitchum   Lorazepam 48m BID for anxiety - make take one tablet extra for severe anxiety symptoms. Lamictal 2031mat  hs Lithium 30062maily   Labs - lithium level, TSH, CMP - next visit  RTC 6 weeks  Counseled patient regarding potential benefits, risks, and side effects of Lamictal to include potential risk of Stevens-Johnson syndrome. Advised patient to stop taking Lamictal and contact office immediately if rash develops and to seek urgent medical attention if rash is severe and/or spreading quickly.  Discussed potential benefits, risk, and side effects of benzodiazepines to include potential risk of tolerance and dependence, as well as possible drowsiness.  Advised patient not to drive if experiencing drowsiness and to take lowest possible effective dose to minimize risk of dependence and tolerance.     Diagnoses and all orders for this visit:  Bipolar I disorder, most recent episode depressed, in remission (HCCEast [place]piePTSD (post-traumatic stress disorder)  Generalized anxiety disorder  Insomnia, unspecified type     Please see After Visit Summary for patient specific instructions.  Future Appointments  Date Time Provider DepBabbie/26/2022  1:00 PM MitBlanchie ServehD CP-CP None  08/08/2020  9:00 AM MitBlanchie ServehD CP-CP None  08/18/2020 11:00 AM MitBlanchie ServehD CP-CP None  08/23/2020 10:00 AM MitBlanchie ServehD CP-CP None  08/28/2020 11:00 AM MitBlanchie ServehD CP-CP None  09/04/2020 11:00 AM MitBlanchie ServehD CP-CP None  09/13/2020 11:20 AM Jonathon Tan, RegBerdie OgrenP CP-CP None  03/22/2021  2:30 PM [facility] HEALTH COACH [facility] PEC    No orders of the defined types were placed in this encounter.   -------------------------------

## 2020-08-03 ENCOUNTER — Ambulatory Visit (INDEPENDENT_AMBULATORY_CARE_PROVIDER_SITE_OTHER): Payer: Medicare Other | Admitting: Psychiatry

## 2020-08-03 ENCOUNTER — Other Ambulatory Visit: Payer: Self-pay

## 2020-08-03 DIAGNOSIS — F411 Generalized anxiety disorder: Secondary | ICD-10-CM

## 2020-08-03 DIAGNOSIS — F4310 Post-traumatic stress disorder, unspecified: Secondary | ICD-10-CM

## 2020-08-03 DIAGNOSIS — F3176 Bipolar disorder, in full remission, most recent episode depressed: Secondary | ICD-10-CM | POA: Diagnosis not present

## 2020-08-03 DIAGNOSIS — N184 Chronic kidney disease, stage 4 (severe): Secondary | ICD-10-CM

## 2020-08-03 DIAGNOSIS — U099 Post covid-19 condition, unspecified: Secondary | ICD-10-CM

## 2020-08-03 DIAGNOSIS — R41840 Attention and concentration deficit: Secondary | ICD-10-CM

## 2020-08-03 NOTE — Progress Notes (Signed)
Psychotherapy Progress Note [facility] [name], PhD LP  Patient ID: Tiffany [name]     MRN: [id number] Therapy format: Individual psychotherapy Date: 08/03/2020      Start: 1:05p     Stop: 1:55p     Time Spent: 50 min Location: In-person   Session narrative (presenting needs, interim history, self-report of stressors and symptoms, applications of prior therapy, status changes, and interventions made in session) Med check yesterday reports weight gain, decreased interest, but "doing good".  Latest lab results show significant creatinine buildup.  H has liked the suggestions made for redirecting anxious aggression.  Ironically, has had no cause to use "Whee" or "whoa" for startle in the car, and [name]'s driving has been clearly more considerate since they talked about it.  Worry basket technique continues to work for releasing to sleep.  Encouraged to still practice responding "as if" startled, for fluency's sake.  This morning woke with some sadness about losing relationships.  One friend of over 79 years [name]), who were very close in the past when raising children.  Currently feels behind in coping with crowds and commotion, and last encounter with [name] (phone), [name] was hypomanic.  Interpreted likely post-mania depression, but feel free to check it out.  Also found out yesterday sister [name] just diagnosed with breast CA, renewing fears of loss with two sisters who have batted cancer.    Certainly thought about [name] since last week, but didn't broach the subject for feeling unsettled about how.  Discussed imaginable responses from the kids, acknowledging [name]'s limited capacity to run home (hospitalist) and [name]'s tendency to [employer].  Framed task of making a call list (for self, and [name] if needed) in the event [name] dies.  Discussed other eventualities, like finances, wills, or if both she and [name] went together.  Briefed on crisis procedure if [name] dies  at home (call 911, still).  Brainstormed outreach calls [name] in house, Octavia Bruckner may be occupied at hospital, friendly next-door neighbor Myrtle).    Cleared by neurologist yesterday, after learning she had had epilepsy identified.  Reflexes fine.  Personally, c/o  Rain fog, still, times of low fluency, and not being able to find her words if she needs to explain or ask something.  Interpreted residual suppression from anxiety about fear of negative interactions, as well as residual disruptions from post-COVID syndrome.  After session noticed high creatinine level in most recent labs.  Therapeutic modalities: Cognitive Behavioral Therapy, Solution-Oriented/Positive Psychology and Psycho-education/Bibliotherapy  Mental Status/Observations:  Appearance:   Casual and Neat     Behavior:  Appropriate  Motor:  Normal and slowed somewhat  Speech/Language:   Clear and Coherent  Affect:  Appropriate and mild blunting  Mood:  normal  Thought process:  normal  Thought content:    WNL  Sensory/Perceptual disturbances:    WNL  Orientation:  Fully oriented  Attention:  Good    Concentration:  Good  Memory:  limited working memory  Insight:    Fair  Judgment:   Good  Impulse Control:  Good   Risk Assessment: Danger to Self: No Self-injurious Behavior: No Danger to Others: No Physical Aggression / Violence: No Duty to Warn: No Access to Firearms a concern: No  Assessment of progress:  progressing  Diagnosis:   ICD-10-CM   1. Bipolar I disorder, most recent episode depressed, in remission ([place])  F31.76   2. Generalized anxiety disorder  F41.1   3. Post-COVID chronic concentration and fluency deficit  R41.840    U09.9   4. PTSD (post-traumatic stress disorder)  F43.10   5. CKD (chronic kidney disease) stage 4, GFR 15-29 ml/min (HCC)  N18.4    Plan:  . Practice alternative reaction to startle, even when don't need it -- some "as if" practice, so it's more available under duress . Continue to  discuss ad lib with H alternative responses under duress . Continue use of worry bowl technique unburdening for sleep . Make observation to [name] and ask if there is something personal or she's more simply been down lately . Option children's puzzles, drawing, and poetry writing -- with no "good enough" judgments, just trying them out -- ad lib for memory and mental fluency, suggested Qd or 3/wk.   . Other recommendations/advice as may be noted above . Continue to utilize previously learned skills ad lib . Maintain medication as prescribed and work faithfully with relevant prescriber(s) if any changes are desired or seem indicated . Noted high creatinine level in EHR -- query to psychiatry whether this would be a factor in ongoing mental dullness, memory issues, possibly missed by neurology yesterday, and whether it should be further addressed in lifestyle, e.g., improved hydration for CKD . Call the clinic on-call service, present to ER, or call 911 if any life-threatening psychiatric crisis Return in about 1 week (around 08/10/2020) for session(s) already scheduled. . Already scheduled visit in this office 08/08/2020.  [name], PhD [name], PhD LP Clinical Psychologist, [facility] [address] [place], [place] [address] [phone]

## 2020-08-08 ENCOUNTER — Ambulatory Visit (INDEPENDENT_AMBULATORY_CARE_PROVIDER_SITE_OTHER): Payer: Medicare Other | Admitting: Psychiatry

## 2020-08-08 ENCOUNTER — Other Ambulatory Visit: Payer: Self-pay

## 2020-08-08 DIAGNOSIS — R41840 Attention and concentration deficit: Secondary | ICD-10-CM

## 2020-08-08 DIAGNOSIS — F4310 Post-traumatic stress disorder, unspecified: Secondary | ICD-10-CM | POA: Diagnosis not present

## 2020-08-08 DIAGNOSIS — F3176 Bipolar disorder, in full remission, most recent episode depressed: Secondary | ICD-10-CM

## 2020-08-08 DIAGNOSIS — U099 Post covid-19 condition, unspecified: Secondary | ICD-10-CM

## 2020-08-08 DIAGNOSIS — F411 Generalized anxiety disorder: Secondary | ICD-10-CM

## 2020-08-08 DIAGNOSIS — F4010 Social phobia, unspecified: Secondary | ICD-10-CM | POA: Diagnosis not present

## 2020-08-08 NOTE — Progress Notes (Signed)
Psychotherapy Progress Note [facility] [name], PhD LP  Patient ID: Tiffany [name]     MRN: [id number] Therapy format: Individual psychotherapy Date: 08/08/2020      Start: 9:20a     Stop: 10:09a     Time Spent: 49 min Location: In-person   Session narrative (presenting needs, interim history, self-report of stressors and symptoms, applications of prior therapy, status changes, and interventions made in session) Got some conversation with [name] about how things could go and what their steps would be if something happens to [place].  [name] became anxious about all the unknowns herself, [place] prompted an orientation session where [name] went through documents on a thumb drive he gave her and refreshed knowledge of who to call for what.  Successfully got [name] more aware and [name] understanding of who to call, in what order, where to find helpful information.  Helped all feel more comfortable and fluent should they have to deal with end of life concerns for [facility].  New hx -- turns out [place] spent 5 hours on the floor incapacitated, back when she fell with COVID, before [name] brought up calling an ambulance.  [name] had tried to manage long with first aid.  That incident detracted from confidence that she could be effectively taken care of in another emergency.  Support provided, reviewed likelihood family have learned from the crisis and encouraged in being ready to speak up if she herself feels the need to confirm willingness for more than home care in the future.  Enjoyed a baby shower recently, then woke 5am with worries about how she did socially.  Some apprehension lately about having some family come visit.  Feels more introverted than she used to be, prefers small gatherings, but several family from [place] are on the verge of self-inviting to join a visit.  Discussed messaging to manage feeling overtaxed, and provision for [name] and [name] to  be more on duty for hosting.  Emergency call from sister [name], in [place], about a water leak in her room and 5 hours nobody answering the call bell, got Deondria to call the front desk for her.  Annoyed by tone and position, reminding her of history of [name] feeling entitled, wanting to be served, and reminders of father.  Does see [name] running down goodwill with family, including sister [name], just 20 min from her.  Annoying habit of [name] saying she knows "exactly" how she feels, when she can't possibly.  Habit of minimizing it when [place] reveals emotional vulnerability, and there has been a string of calls made for handling things at the home, in ways that burden and wear [name] out.  Discussed responses to negativity.    Therapeutic modalities: Cognitive Behavioral Therapy, Solution-Oriented/Positive Psychology, Ego-Supportive, and Assertiveness/Communication  Mental Status/Observations:  Appearance:   Neat     Behavior:  Appropriate  Motor:  Normal and some slowing d/t condition  Speech/Language:   Clear and Coherent  Affect:  Appropriate  Mood:  constricted  Thought process:  normal  Thought content:    WNL  Sensory/Perceptual disturbances:    WNL  Orientation:  Fully oriented  Attention:  Good    Concentration:  Fair  Memory:  WNL  Insight:    Good  Judgment:   Good  Impulse Control:  Good   Risk Assessment: Danger to Self: No Self-injurious Behavior: No Danger to Others: No Physical Aggression / Violence: No Duty to Warn: No Access to Firearms a concern: No  Assessment of progress:  progressing  Diagnosis:   ICD-10-CM   1. Bipolar I disorder, most recent episode depressed, in remission ([place])  F31.76     2. PTSD (post-traumatic stress disorder)  F43.10     3. Generalized anxiety disorder  F41.1     4. Social anxiety disorder  F40.10     5. Post-COVID chronic concentration and fluency deficit  R41.840    U09.9      Plan:  Clarify family readiness to act, as needed, for  future medical need Consider assertive responses to [name] imposing remote-service needs Work out with family members steps or conditions hosting family this summer Other recommendations/advice as may be noted above Continue to utilize previously learned skills ad lib Maintain medication as prescribed and work faithfully with relevant prescriber(s) if any changes are desired or seem indicated Call the clinic on-call service, present to ER, or call 911 if any life-threatening psychiatric crisis Return for session(s) already scheduled. Already scheduled visit in this office 08/18/2020.  [name], PhD [name], PhD LP Clinical Psychologist, [facility] [address] [place], [place] [address] [phone]

## 2020-08-18 ENCOUNTER — Ambulatory Visit (INDEPENDENT_AMBULATORY_CARE_PROVIDER_SITE_OTHER): Payer: Medicare Other | Admitting: Psychiatry

## 2020-08-18 ENCOUNTER — Other Ambulatory Visit: Payer: Self-pay

## 2020-08-18 DIAGNOSIS — F319 Bipolar disorder, unspecified: Secondary | ICD-10-CM | POA: Diagnosis not present

## 2020-08-18 DIAGNOSIS — R41840 Attention and concentration deficit: Secondary | ICD-10-CM | POA: Diagnosis not present

## 2020-08-18 DIAGNOSIS — U099 Post covid-19 condition, unspecified: Secondary | ICD-10-CM

## 2020-08-18 DIAGNOSIS — F4310 Post-traumatic stress disorder, unspecified: Secondary | ICD-10-CM | POA: Diagnosis not present

## 2020-08-18 DIAGNOSIS — F411 Generalized anxiety disorder: Secondary | ICD-10-CM

## 2020-08-18 DIAGNOSIS — Z62820 Parent-biological child conflict: Secondary | ICD-10-CM

## 2020-08-18 NOTE — Progress Notes (Signed)
Psychotherapy Progress Note [facility] [name], PhD LP  Patient ID: Tiffany [name]     MRN: [id number] Therapy format: Individual psychotherapy Date: 08/18/2020      Start: 11:23     Stop: 12:13p     Time Spent: 50 min Location: In-person   Session narrative (presenting needs, interim history, self-report of stressors and symptoms, applications of prior therapy, status changes, and interventions made in session) Did help to get orientation to procedures if [name] passes.    Acknowledges feeling drained and annoyed sometimes with [name]'s anxiety.  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  DIL [name] and [name] have never had that warm a relationship, but [name] had thought it would be more familial, as sisters-in-law, while [name] has felt upended by the resignation, even though [name] mentioned it February and twice after.  Back hx that [name] was raised by an alcoholic mother, is about to turn 77, has always shown a stiffness around [place], and she is the only Black family member.  Have had success bonding, [name] and [name], especially during early life of a premature baby.  Notable memory of riding with her and her expressing concern whether [name] and [name] would feel like she was a Careers information officer, since Octavia Bruckner was in a bad marriage and med school when they met, but Shanera has definitely shown her approval and warmth and seen reassurance.  Oddly, [name] bounced a check.  [name], while ostensibly sober, has been revealed to be ordering a lot of things online, and he works as a night shift hospitalist.  Discussed possible needs for clarifying boundary conversations and tone-setting for family getting along and openness to ask for what people need, especially where [name] and Hebert Soho are concerned.    Therapeutic modalities: Cognitive Behavioral Therapy and  Solution-Oriented/Positive Psychology  Mental Status/Observations:  Appearance:   Casual     Behavior:  Appropriate  Motor:  Normal and slowed  Speech/Language:   Clear and Coherent and minor interruptions  Affect:  Constricted  Mood:  anxious  Thought process:  normal  Thought content:    WNL  Sensory/Perceptual disturbances:    WNL  Orientation:  Fully oriented  Attention:  Good    Concentration:  Good  Memory:  WNL  Insight:    Good  Judgment:   Good  Impulse Control:  Good   Risk Assessment: Danger to Self: No Self-injurious Behavior: No Danger to Others: No Physical Aggression / Violence: No Duty to Warn: No Access to Firearms a concern: No  Assessment of progress:  progressing  Diagnosis:   ICD-10-CM   1. Bipolar I disorder ([place])  F31.9     2. PTSD (post-traumatic stress disorder)  F43.10     3. Generalized anxiety disorder  F41.1     4. Post-COVID chronic concentration and fluency deficit  R41.840    U09.9     5. Relationship problem between parent and child  Z62.820      Plan:  Recommend clarifying conversation about boundaries and relations with D and DIL Continue clarifying supports and end-of-life procedures PRN Other recommendations/advice as may be noted above Continue to utilize previously learned skills ad lib Maintain medication as prescribed and work faithfully with relevant prescriber(s) if any changes are desired or seem indicated Call the clinic on-call service, present to ER, or call 911 if any life-threatening  psychiatric crisis Return in about 1 week (around 08/25/2020) for time as available. Already scheduled visit in this office 08/23/2020.  [name], PhD [name], PhD LP Clinical Psychologist, [facility] [address] [place], [place] [address] [phone]

## 2020-08-23 ENCOUNTER — Ambulatory Visit (INDEPENDENT_AMBULATORY_CARE_PROVIDER_SITE_OTHER): Payer: Medicare Other | Admitting: Psychiatry

## 2020-08-23 ENCOUNTER — Other Ambulatory Visit: Payer: Self-pay

## 2020-08-23 DIAGNOSIS — F3132 Bipolar disorder, current episode depressed, moderate: Secondary | ICD-10-CM

## 2020-08-23 DIAGNOSIS — F4310 Post-traumatic stress disorder, unspecified: Secondary | ICD-10-CM

## 2020-08-23 DIAGNOSIS — R41840 Attention and concentration deficit: Secondary | ICD-10-CM

## 2020-08-23 DIAGNOSIS — F4010 Social phobia, unspecified: Secondary | ICD-10-CM

## 2020-08-23 DIAGNOSIS — H269 Unspecified cataract: Secondary | ICD-10-CM

## 2020-08-23 DIAGNOSIS — U099 Post covid-19 condition, unspecified: Secondary | ICD-10-CM
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
## 2020-08-25 ENCOUNTER — Ambulatory Visit: Payer: Medicare Other | Admitting: Psychiatry

## 2020-08-28 ENCOUNTER — Ambulatory Visit: Payer: Medicare Other | Admitting: Psychiatry

## 2020-08-31 ENCOUNTER — Ambulatory Visit (INDEPENDENT_AMBULATORY_CARE_PROVIDER_SITE_OTHER): Payer: Medicare Other | Admitting: Psychiatry

## 2020-08-31 ENCOUNTER — Other Ambulatory Visit: Payer: Self-pay | Admitting: Physician Assistant

## 2020-08-31 ENCOUNTER — Other Ambulatory Visit: Payer: Self-pay

## 2020-08-31 DIAGNOSIS — F319 Bipolar disorder, unspecified: Secondary | ICD-10-CM | POA: Diagnosis not present

## 2020-08-31 DIAGNOSIS — F5105 Insomnia due to other mental disorder: Secondary | ICD-10-CM | POA: Diagnosis not present

## 2020-08-31 DIAGNOSIS — F4310 Post-traumatic stress disorder, unspecified: Secondary | ICD-10-CM

## 2020-08-31 DIAGNOSIS — R41840 Attention and concentration deficit: Secondary | ICD-10-CM

## 2020-08-31 DIAGNOSIS — U099 Post covid-19 condition, unspecified: Secondary | ICD-10-CM

## 2020-08-31 DIAGNOSIS — F99 Mental disorder, not otherwise specified: Secondary | ICD-10-CM
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
## 2020-09-04 ENCOUNTER — Other Ambulatory Visit: Payer: Self-pay

## 2020-09-04 ENCOUNTER — Ambulatory Visit (INDEPENDENT_AMBULATORY_CARE_PROVIDER_SITE_OTHER): Payer: Medicare Other | Admitting: Psychiatry

## 2020-09-04 DIAGNOSIS — F4310 Post-traumatic stress disorder, unspecified: Secondary | ICD-10-CM | POA: Diagnosis not present

## 2020-09-04 DIAGNOSIS — U099 Post covid-19 condition, unspecified: Secondary | ICD-10-CM

## 2020-09-04 DIAGNOSIS — F4010 Social phobia, unspecified: Secondary | ICD-10-CM

## 2020-09-04 DIAGNOSIS — F3132 Bipolar disorder, current episode depressed, moderate: Secondary | ICD-10-CM | POA: Diagnosis not present

## 2020-09-04 DIAGNOSIS — Z638 Other specified problems related to primary support group: Secondary | ICD-10-CM | POA: Diagnosis not present

## 2020-09-04 DIAGNOSIS — R41840 Attention and concentration deficit: Secondary | ICD-10-CM

## 2020-09-04 DIAGNOSIS — H269 Unspecified cataract: Secondary | ICD-10-CM
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
## 2020-09-05 ENCOUNTER — Ambulatory Visit (HOSPITAL_BASED_OUTPATIENT_CLINIC_OR_DEPARTMENT_OTHER): Payer: Medicare Other | Attending: Physician Assistant | Admitting: Physical Therapy

## 2020-09-05 ENCOUNTER — Encounter (HOSPITAL_BASED_OUTPATIENT_CLINIC_OR_DEPARTMENT_OTHER): Payer: Self-pay | Admitting: Physical Therapy

## 2020-09-05 DIAGNOSIS — M6281 Muscle weakness (generalized): Secondary | ICD-10-CM | POA: Diagnosis present

## 2020-09-05 DIAGNOSIS — R2681 Unsteadiness on feet: Secondary | ICD-10-CM | POA: Insufficient documentation

## 2020-09-05 DIAGNOSIS — R2689 Other abnormalities of gait and mobility: Secondary | ICD-10-CM | POA: Insufficient documentation

## 2020-09-06 ENCOUNTER — Encounter (HOSPITAL_BASED_OUTPATIENT_CLINIC_OR_DEPARTMENT_OTHER): Payer: Self-pay | Admitting: Physical Therapy
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
## 2020-09-06 NOTE — Patient Instructions (Signed)
Access Code: [facility] URL: [email or web address] Date: 09/06/2020 Prepared by: Carolyne Littles  Exercises Seated Knee Extension with Resistance - 1 x daily - 7 x weekly - 3 sets - 10 reps Seated March with Resistance - 1 x daily - 7 x weekly - 3 sets - 10 reps Seated Hip Abduction with Resistance - 1 x daily - 7 x weekly - 3 sets - 10 reps

## 2020-09-13 ENCOUNTER — Ambulatory Visit (INDEPENDENT_AMBULATORY_CARE_PROVIDER_SITE_OTHER): Payer: Medicare Other | Admitting: Adult Health

## 2020-09-13 ENCOUNTER — Encounter: Payer: Self-pay | Admitting: Adult Health

## 2020-09-13 ENCOUNTER — Other Ambulatory Visit: Payer: Self-pay

## 2020-09-13 DIAGNOSIS — F411 Generalized anxiety disorder: Secondary | ICD-10-CM

## 2020-09-13 DIAGNOSIS — F99 Mental disorder, not otherwise specified: Secondary | ICD-10-CM

## 2020-09-13 DIAGNOSIS — F4310 Post-traumatic stress disorder, unspecified: Secondary | ICD-10-CM

## 2020-09-13 DIAGNOSIS — F5105 Insomnia due to other mental disorder: Secondary | ICD-10-CM | POA: Diagnosis not present

## 2020-09-13 DIAGNOSIS — F319 Bipolar disorder, unspecified: Secondary | ICD-10-CM

## 2020-09-13 NOTE — Progress Notes (Signed)
Tiffany [name] [id number] 10-Feb-1951 70 y.o.  Subjective:   Patient ID:  Tiffany [name] is a 70 y.o. (DOB 1950-10-20) female.  Chief Complaint: No chief complaint on file.   HPI Jiah Bari Trulock presents to the office today for follow-up of PTSD, insomnia, GAD, BPD 1.  Describes mood today as "ok". Pleasant. Denies tearfulness. Mood symptoms - reports depression, anxiety, and irritability. Reports one anger outburst. Stating "I'm not doing as well as I was". Seeing Dr. Rica Mote weekly - "going very good". Decreased interest and motivation. Taking medications as prescribed.  Energy levels stable. Active, does not have a regular exercise routine. Working with physical therapy. Enjoys some usual interests and activities. Married. Lives with husband their daughter. Talking to family and friends.  Appetite adequate. Weight gain - "creeping up". Sleeping better some nights than others. Sensitive to noises. Averages 8 hours. Focus and concentration stable. Completing tasks. Managing some aspects of household. Retired.  Denies SI or HI.  Denies AH or VH.   Previous medications: Celexa, Zyprexa, Tegretol, Depakote, Serzone, Topamax, Seroquel, Effexor, Lexapro, Desipramine, Neurontin, Abilify, Geodon, Propanolol, Cymbalta, Cogentin, Trihexyphenadyl, Sinmmet, Provigil, Selegiline, Requip, Amantadine, Prozac, Mirapex, Azilect, Metoclopramide, Baclofen, Artane, Namenda, Latuda.   GAD-7    Flowsheet Row Office Visit from 05/14/2019 in [place]  Total GAD-7 Score 21      [place] Office Visit from 06/07/2019 in [facility] [facility]  Total Score (max 30 points ) 29      PHQ2-9    Flowsheet Row Counselor from 07/26/2020 in [place] Visit from 06/05/2020 in [facility] from 03/16/2020 in [facility] Visit from 09/16/2019 in [place] Visit from 08/24/2019 in [facility]  PHQ-2 Total Score 1 4 2  0 0  PHQ-9 Total Score -- 11 3 -- 4      Flowsheet Row ED from 09/08/2019 in [facility] [place] DEPT ED from 09/04/2019 in [facility] DEPT  C-SSRS RISK CATEGORY High Risk High Risk        Review of Systems:  Review of Systems  Musculoskeletal:  Negative for gait problem.  Neurological:  Negative for tremors.  Psychiatric/Behavioral:         Please refer to HPI   Medications: I have reviewed the patient's current medications.  Current Outpatient Medications  Medication Sig Dispense Refill   amLODipine (NORVASC) 2.5 MG tablet TAKE 1 TABLET(2.5 MG) BY MOUTH DAILY 90 tablet 0   calcitRIOL (ROCALTROL) 0.25 MCG capsule Take 1 capsule (0.25 mcg total) by mouth every Monday, Wednesday, and Friday. 12 capsule 0   Certolizumab Pegol (CIMZIA) 2 X 200 MG KIT      cholecalciferol (VITAMIN D3) 25 MCG (1000 UNIT) tablet Take 1,000 Units by mouth daily.     diphenhydrAMINE (BENADRYL) 50 MG tablet Take 50 mg by mouth at bedtime. Help with sleep     lamoTRIgine (LAMICTAL) 200 MG tablet Take 1 tablet (200 mg total) by mouth at bedtime. 90 tablet 3   levothyroxine (SYNTHROID) 112 MCG tablet TAKE 1 TABLET(112 MCG) BY MOUTH DAILY AT 6 AM 90 tablet 0   lithium carbonate 300 MG capsule TAKE 1 CAPSULE(300 MG) BY MOUTH AT BEDTIME 90 capsule 3   LORazepam (ATIVAN) 1 MG tablet Take 1 tablet (1 mg total) by mouth 2 (two) times daily. (Patient taking differently: Take 1 mg by mouth 2 (two) times daily  as needed.) 60 tablet 2   metroNIDAZOLE (METROGEL) 0.75 % gel Apply to face 1-2 times daily 45 g 0   No current facility-administered medications for this visit.    Medication Side Effects: None  Allergies:  Allergies  Allergen Reactions   Aripiprazole Other (See Comments)    Parkinsonism      Lactose Intolerance (Gi) Diarrhea   Methotrexate Other (See  Comments)    Hair loss, severe stomatitis     Cefdinir Diarrhea    Other reaction(s): Diarrhea Yeast infection and fever; negative c diff   Etanercept Other (See Comments)    Headaches     Exemestane Other (See Comments)    Suicidal thoughts with medication     Fluoxetine Other (See Comments)    Parkinsonism   Methylprednisolone Sodium Succ Other (See Comments)    Agitated mania   Epinephrine Palpitations    tachycardia    Nitrofurantoin Nausea And Vomiting and Rash         Past Medical History:  Diagnosis Date   Bipolar 1 disorder ([place])    Breast cancer ([place])    Chronic diarrhea    loose stools twice a day on average for years   Chronic kidney disease (CKD), stage III (moderate) ([place])    Depression 1987   Hospitalization or health care facility admission within last 6 months 04/2019   for fall/seizures   Malignant neoplasm of overlapping sites of left breast in female, estrogen receptor positive ([place]) 03/14/2016   Dx in 09/2014, s/p bilateral mastectomies and ALND, 0/10 LN. 1.4 cm  Grade I invasive lobular, ER and PR +/Her--, Ki 67 <5% Tried anastrozole for one month, but developed suicidal idea   Osteoarthritis, knee 09/24/2019   Xray 09/2019   Parkinson's disease ([place]) 2012   Psoriatic arthritis ([place])    PTSD (post-traumatic stress disorder)    Secondary hyperparathyroidism ([place])    Tardive dyskinesia     Past Medical History, Surgical history, Social history, and Family history were reviewed and updated as appropriate.   Please see review of systems for further details on the patient's review from today.   Objective:   Physical Exam:  There were no vitals taken for this visit.  Physical Exam Constitutional:      General: She is not in acute distress. Musculoskeletal:        General: No deformity.  Neurological:     Mental Status: She is alert and oriented to person, place, and time.     Coordination: Coordination normal.  Psychiatric:        Attention  and Perception: Attention and perception normal. She does not perceive auditory or visual hallucinations.        Mood and Affect: Mood normal. Mood is not anxious or depressed. Affect is not labile, blunt, angry or inappropriate.        Speech: Speech normal.        Behavior: Behavior normal.        Thought Content: Thought content normal. Thought content is not paranoid or delusional. Thought content does not include homicidal or suicidal ideation. Thought content does not include homicidal or suicidal plan.        Cognition and Memory: Cognition and memory normal.        Judgment: Judgment normal.     Comments: Insight intact    Lab Review:     Component Value Date/Time   NA 141 07/06/2020 0738   NA 138 06/08/2019 0000   K 4.5 07/06/2020  0738   CL 109 07/06/2020 0738   CO2 23 07/06/2020 0738   GLUCOSE 96 07/06/2020 0738   BUN 27 (H) 07/06/2020 0738   BUN 29 (A) 06/08/2019 0000   CREATININE 2.13 (H) 07/06/2020 0738   CALCIUM 9.4 07/06/2020 0738   PROT 7.0 12/03/2019 1105   ALBUMIN 4.0 09/10/2019 0851   AST 16 04/10/2020 0000   ALT 13 04/10/2020 0000   ALKPHOS 145 (A) 04/10/2020 0000   BILITOT 1.0 12/03/2019 1105   GFRNONAA 22 04/10/2020 0000   GFRAA 25 (L) 09/13/2019 1243       Component Value Date/Time   WBC 12.0 04/10/2020 0000   WBC 13.0 (H) 09/08/2019 1347   RBC 4.95 09/08/2019 1347   HGB 14.4 09/08/2019 1347   HCT 46.0 09/08/2019 1347   PLT 220 09/08/2019 1347   MCV 92.9 09/08/2019 1347   MCH 29.1 09/08/2019 1347   MCHC 31.3 09/08/2019 1347   RDW 13.1 09/08/2019 1347   LYMPHSABS 2.5 04/27/2019 0544   MONOABS 0.5 04/27/2019 0544   EOSABS 0.2 04/27/2019 0544   BASOSABS 0.0 04/27/2019 0544    Lithium Lvl  Date Value Ref Range Status  07/06/2020 0.6 0.6 - 1.2 mmol/L Final     No results found for: PHENYTOIN, PHENOBARB, VALPROATE, CBMZ   .res Assessment: Plan:     Plan:  Therapist - Andy Mitchum   Lorazepam 67m BID for anxiety - make take one tablet  extra for severe anxiety symptoms. Lamictal 2042mat hs Lithium 30082maily    Labs - lithium level, TSH, CMP - next visit  RTC 6 weeks  Counseled patient regarding potential benefits, risks, and side effects of Lamictal to include potential risk of Stevens-Johnson syndrome. Advised patient to stop taking Lamictal and contact office immediately if rash develops and to seek urgent medical attention if rash is severe and/or spreading quickly.  Discussed potential benefits, risk, and side effects of benzodiazepines to include potential risk of tolerance and dependence, as well as possible drowsiness.  Advised patient not to drive if experiencing drowsiness and to take lowest possible effective dose to minimize risk of dependence and tolerance.     Diagnoses and all orders for this visit:  PTSD (post-traumatic stress disorder)  Insomnia due to other mental disorder  Bipolar I disorder ([place]Generalized anxiety disorder    Please see After Visit Summary for patient specific instructions.  Future Appointments  Date Time Provider DepNumidia/10/2020  1:00 PM MitBlanchie ServehD CP-CP None  09/19/2020  8:00 AM MitBlanchie ServehD CP-CP None  09/27/2020 11:00 AM MitBlanchie ServehD CP-CP None  10/02/2020 10:00 AM MitBlanchie ServehD CP-CP None  10/09/2020 11:00 AM MitBlanchie ServehD CP-CP None  10/16/2020  2:00 PM MitBlanchie ServehD CP-CP None  10/25/2020 10:20 AM Ion Gonnella, RegBerdie OgrenP CP-CP None  03/22/2021  2:30 PM [facility] CO[facility]    No orders of the defined types were placed in this encounter.   -------------------------------

## 2020-09-14 ENCOUNTER — Telehealth: Payer: Self-pay | Admitting: Adult Health

## 2020-09-14 ENCOUNTER — Other Ambulatory Visit: Payer: Self-pay

## 2020-09-14 ENCOUNTER — Other Ambulatory Visit: Payer: Self-pay | Admitting: Psychiatry

## 2020-09-14 DIAGNOSIS — F411 Generalized anxiety disorder: Secondary | ICD-10-CM

## 2020-09-14 MED ORDER — LORAZEPAM 1 MG PO TABS: 1.0000 mg | ORAL_TABLET | Freq: Two times a day (BID) | ORAL | 0 refills | Status: DC | PRN

## 2020-09-14 NOTE — Telephone Encounter (Signed)
Pt called and said that she feels worse today than yesterday. She wanted gina to be aware and if medicines need to be changed. Please call her at [phone]

## 2020-09-14 NOTE — Telephone Encounter (Signed)
Pended.

## 2020-09-14 NOTE — Telephone Encounter (Signed)
No, go ahead and pend.

## 2020-09-14 NOTE — Telephone Encounter (Signed)
Last filled 5/15 had appt with you yesterday.I went to pend it and it showed it is pended by another provider [name] [name], MD.Is she prescribed this by him instead?

## 2020-09-14 NOTE — Telephone Encounter (Signed)
LVM for pt to return call

## 2020-09-14 NOTE — Telephone Encounter (Signed)
Pt called and needs a refill on her lorazapam 1 mg to be sent to the [facility] in [place]

## 2020-09-15 ENCOUNTER — Ambulatory Visit (INDEPENDENT_AMBULATORY_CARE_PROVIDER_SITE_OTHER): Payer: Medicare Other | Admitting: Psychiatry

## 2020-09-15 ENCOUNTER — Other Ambulatory Visit: Payer: Self-pay | Admitting: Adult Health

## 2020-09-15 ENCOUNTER — Ambulatory Visit: Payer: Medicare Other | Admitting: Psychiatry

## 2020-09-15 ENCOUNTER — Other Ambulatory Visit: Payer: Self-pay

## 2020-09-15 DIAGNOSIS — F4010 Social phobia, unspecified: Secondary | ICD-10-CM | POA: Diagnosis not present

## 2020-09-15 DIAGNOSIS — U099 Post covid-19 condition, unspecified: Secondary | ICD-10-CM

## 2020-09-15 DIAGNOSIS — F314 Bipolar disorder, current episode depressed, severe, without psychotic features: Secondary | ICD-10-CM

## 2020-09-15 DIAGNOSIS — F4310 Post-traumatic stress disorder, unspecified: Secondary | ICD-10-CM | POA: Diagnosis not present

## 2020-09-15 DIAGNOSIS — H269 Unspecified cataract: Secondary | ICD-10-CM

## 2020-09-15 DIAGNOSIS — Z638 Other specified problems related to primary support group: Secondary | ICD-10-CM

## 2020-09-15 DIAGNOSIS — R41840 Attention and concentration deficit: Secondary | ICD-10-CM

## 2020-09-15 MED ORDER — PRAZOSIN HCL 1 MG PO CAPS: 1.0000 mg | ORAL_CAPSULE | Freq: Every day | ORAL | 2 refills | Status: DC

## 2020-09-15 NOTE — Telephone Encounter (Signed)
Pt informed

## 2020-09-15 NOTE — Telephone Encounter (Signed)
Will send in a prescription for minipress 0.1mg  at bedtime. Pls advise to call for worsening mood symptoms.

## 2020-09-15 NOTE — Telephone Encounter (Signed)
Pt stated she has been very sad,down and depressed lately.She feels like her meds need to be adjusted.She mentioned a blood pressure med that would help her sleep that you spoke with her about previously.

## 2020-09-15 NOTE — Telephone Encounter (Signed)
Pt returned your call and left a message please call her back at [phone]
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
## 2020-09-18 ENCOUNTER — Other Ambulatory Visit: Payer: Self-pay | Admitting: Adult Health

## 2020-09-18 ENCOUNTER — Telehealth: Payer: Self-pay | Admitting: Adult Health

## 2020-09-18 DIAGNOSIS — Z79899 Other long term (current) drug therapy: Secondary | ICD-10-CM

## 2020-09-18 NOTE — Telephone Encounter (Signed)
You told her to call with worsening mood symptoms after starting prazosin,how ever she did not start it yet due to an eye surgery coming up.She wants to know can something be increased instead to help her depression

## 2020-09-18 NOTE — Telephone Encounter (Signed)
Called and spoke with patient. Sending for labs - TSH, BMP, Lithium level.

## 2020-09-18 NOTE — Telephone Encounter (Signed)
Pt called reporting she has been crying a lot and depression worse since last apt 7/6 with RM. Asking if meds should be increased. Pt has not started Prazosin due to surgery on eyes (cataract) coming up. FYI has apt w/ AM in the morning, she added. Contact # [phone]. Next apt 8/17 w/RM

## 2020-09-19 ENCOUNTER — Ambulatory Visit (INDEPENDENT_AMBULATORY_CARE_PROVIDER_SITE_OTHER): Payer: Medicare Other | Admitting: Psychiatry

## 2020-09-19 ENCOUNTER — Other Ambulatory Visit: Payer: Self-pay

## 2020-09-19 ENCOUNTER — Ambulatory Visit: Payer: Medicare Other | Admitting: Psychiatry

## 2020-09-19 ENCOUNTER — Telehealth: Payer: Self-pay | Admitting: Adult Health

## 2020-09-19 DIAGNOSIS — F4010 Social phobia, unspecified: Secondary | ICD-10-CM

## 2020-09-19 DIAGNOSIS — Z638 Other specified problems related to primary support group: Secondary | ICD-10-CM

## 2020-09-19 DIAGNOSIS — F314 Bipolar disorder, current episode depressed, severe, without psychotic features: Secondary | ICD-10-CM

## 2020-09-19 DIAGNOSIS — Z961 Presence of intraocular lens: Secondary | ICD-10-CM

## 2020-09-19 DIAGNOSIS — F4310 Post-traumatic stress disorder, unspecified: Secondary | ICD-10-CM | POA: Diagnosis not present

## 2020-09-19 DIAGNOSIS — U099 Post covid-19 condition, unspecified: Secondary | ICD-10-CM

## 2020-09-19 DIAGNOSIS — R41840 Attention and concentration deficit: Secondary | ICD-10-CM
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
## 2020-09-19 NOTE — Telephone Encounter (Signed)
FYI

## 2020-09-19 NOTE — Telephone Encounter (Signed)
Ok - I will advise of results when labs received.

## 2020-09-19 NOTE — Telephone Encounter (Signed)
Yes this AM

## 2020-09-19 NOTE — Telephone Encounter (Signed)
Pt called and said that she is feeling much better. She just wanted Tiffany [name] to know

## 2020-09-19 NOTE — Telephone Encounter (Signed)
Noted. Did she get the labs done?

## 2020-09-20 ENCOUNTER — Other Ambulatory Visit: Payer: Self-pay | Admitting: Adult Health

## 2020-09-20 LAB — BASIC METABOLIC PANEL
BUN/Creatinine Ratio: 12 (calc) (ref 6–22)
BUN: 28 mg/dL — ABNORMAL HIGH (ref 7–25)
CO2: 25 mmol/L (ref 20–32)
Calcium: 9.5 mg/dL (ref 8.6–10.4)
Chloride: 108 mmol/L (ref 98–110)
Creat: 2.35 mg/dL — ABNORMAL HIGH (ref 0.60–1.00)
Glucose, Bld: 83 mg/dL (ref 65–99)
Potassium: 4.5 mmol/L (ref 3.5–5.3)
Sodium: 140 mmol/L (ref 135–146)

## 2020-09-20 LAB — TSH: TSH: 2.77 mIU/L (ref 0.40–4.50)

## 2020-09-20 LAB — LITHIUM LEVEL: Lithium Lvl: 0.6 mmol/L (ref 0.6–1.2)

## 2020-09-21 ENCOUNTER — Ambulatory Visit: Payer: Medicare Other | Admitting: Psychiatry

## 2020-09-21 NOTE — Telephone Encounter (Signed)
Reviewed

## 2020-09-27 ENCOUNTER — Other Ambulatory Visit: Payer: Self-pay

## 2020-09-27 ENCOUNTER — Ambulatory Visit (INDEPENDENT_AMBULATORY_CARE_PROVIDER_SITE_OTHER): Payer: Medicare Other | Admitting: Psychiatry

## 2020-09-27 DIAGNOSIS — R41840 Attention and concentration deficit: Secondary | ICD-10-CM

## 2020-09-27 DIAGNOSIS — U099 Post covid-19 condition, unspecified: Secondary | ICD-10-CM

## 2020-09-27 DIAGNOSIS — F314 Bipolar disorder, current episode depressed, severe, without psychotic features: Secondary | ICD-10-CM | POA: Diagnosis not present

## 2020-09-27 DIAGNOSIS — F4010 Social phobia, unspecified: Secondary | ICD-10-CM | POA: Diagnosis not present

## 2020-09-27 DIAGNOSIS — Z961 Presence of intraocular lens: Secondary | ICD-10-CM

## 2020-09-27 DIAGNOSIS — F319 Bipolar disorder, unspecified: Secondary | ICD-10-CM

## 2020-09-27 DIAGNOSIS — F4310 Post-traumatic stress disorder, unspecified: Secondary | ICD-10-CM

## 2020-09-27 DIAGNOSIS — N184 Chronic kidney disease, stage 4 (severe): Secondary | ICD-10-CM

## 2020-09-27 DIAGNOSIS — G2401 Drug induced subacute dyskinesia: Secondary | ICD-10-CM

## 2020-09-27 DIAGNOSIS — Z62820 Parent-biological child conflict: Secondary | ICD-10-CM
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
## 2020-09-28 ENCOUNTER — Other Ambulatory Visit: Payer: Self-pay | Admitting: Adult Health

## 2020-09-28 ENCOUNTER — Ambulatory Visit: Payer: Medicare Other | Admitting: Adult Health

## 2020-09-28 DIAGNOSIS — F319 Bipolar disorder, unspecified: Secondary | ICD-10-CM

## 2020-09-28 MED ORDER — LAMOTRIGINE 25 MG PO TABS: ORAL_TABLET | ORAL | 2 refills | Status: DC

## 2020-09-28 NOTE — Progress Notes (Signed)
Called and spoke with patient about medication change. Reporting worsening mood symptoms Tearful during conversation Does not feel like current medications are enough to manage mood symptoms. Is willing to try an increased dose of Lamictal  in the morning -25 mg every morning for days, then increase to 50mg  in the morning. Patient reports passive SI without plan or intent. Discussed safety plan if mood worsens. Lives at home with husband and daughter and they are aware of plan.

## 2020-10-02 ENCOUNTER — Other Ambulatory Visit: Payer: Self-pay

## 2020-10-02 ENCOUNTER — Ambulatory Visit (INDEPENDENT_AMBULATORY_CARE_PROVIDER_SITE_OTHER): Payer: Medicare Other | Admitting: Psychiatry

## 2020-10-02 DIAGNOSIS — U099 Post covid-19 condition, unspecified: Secondary | ICD-10-CM

## 2020-10-02 DIAGNOSIS — F314 Bipolar disorder, current episode depressed, severe, without psychotic features: Secondary | ICD-10-CM

## 2020-10-02 DIAGNOSIS — R41840 Attention and concentration deficit: Secondary | ICD-10-CM | POA: Diagnosis not present

## 2020-10-02 DIAGNOSIS — F4310 Post-traumatic stress disorder, unspecified: Secondary | ICD-10-CM | POA: Diagnosis not present

## 2020-10-02 DIAGNOSIS — F4010 Social phobia, unspecified: Secondary | ICD-10-CM | POA: Diagnosis not present

## 2020-10-02 DIAGNOSIS — G2401 Drug induced subacute dyskinesia: Secondary | ICD-10-CM

## 2020-10-02 NOTE — Progress Notes (Signed)
Psychotherapy Progress Note [facility] [name], PhD LP  Patient ID: Tiffany [name]     MRN: [id number] Therapy format: Individual psychotherapy Date: 10/02/2020      Start: 10:15a     Stop: 11:05a     Time Spent: 50 min Location: In-person   Session narrative (presenting needs, interim history, self-report of stressors and symptoms, applications of prior therapy, status changes, and interventions made in session) Tired, poor sleep last night, took Ativan this morning.  Feels she was hypomanic last week, then crashed.  Persistent SI returned, enough for her to look up overdose dosages of her meds this weekend.  Started Thursday, called psychiatry, who began a gradual Lamictal increase, but thinks she may need IOP or PHS, something stronger.  Currently remains on lithium, 200mg  lamotrigine qhs, and has added 25mg  qhs (written for qam).  Prazosin not begun due to eye problem and contraindication, and then lack of apparent PTSD sxs, though she was highly triggered no more than month ago.  As of today, does still wants to die, soaked in loneliness, hopelessness, but willing to keep living for now.  Not oppressed by bad memories or having nightmares, but it was only a few weeks ago that she had near-dissociative reaction to trauma content in session.    As support goes, she has confided in Kirk made a rare call of support.  Though disappointed somewhat in how [place] handled support, found it within herself to sincerely express approval and, love, and being impressed with how [name] is managing her family and career life, that it's honestly OK if she has little time.  Discussed support with husband, about which she has been very conflicted, but still acknowledges seeing him more favorably than she did a few weeks ago, when she was focused on religious hypocrisy, tone deaf empathy, and perceived insensitive sexual interest.  Addressed tendency to inhibit herself  asking for support from each of her family members as part of what maintains depression, reaffirmed it is OK to ask for what she needs, how else would she find out what others can do, though they may need more information and guidance to get it more right, if she is willing.  Discussed possibility that husband can be more apt support with some guidance, framed clearly platonic touch (a hand, arm around her shoulder, freedom to cry in his arms) as potentially deep-running help, provided it is clear between them that she has radical choice over receiving it and no pressure to process things verbally or proceed to sex, just the unconditional offer of contact comfort.  With her agreement, briefed husband on the idea after session, which he seemed to understand clearly and agree.  Otherwise, agreed to 2/wk therapy and recommend adding B complex and omega 3 to her vitamin mix on the idea that they will better support neural repair and tone down systemic inflammation, which may slow repair and exacerbate mood.  Confirms also that she has tardive dyskinesia, was on Ingrezza, but she was summarily withdrawn from it by neurologist in her hospitalization and never put back on it.  Chart review shows no restoration by outpatient neuro 5/24, only the mention of Brittanee being in behavioral health care.  Informed it seems to be at her discretion in c/o psychiatry and informed psychiatry.   Therapeutic modalities: Cognitive Behavioral Therapy, Solution-Oriented/Positive Psychology, and Ego-Supportive  Mental Status/Observations:  Appearance:   Casual and Neat     Behavior:  Appropriate  Motor:  TD and slowed gait  Speech/Language:   Clear and Coherent  Affect:  Depressed  Mood:  depressed  Thought process:  normal  Thought content:    Rumination  Sensory/Perceptual disturbances:    WNL  Orientation:  grossly intact  Attention:  Fair    Concentration:  Fair  Memory:  grossly intact  Insight:    Fair  Judgment:    Good  Impulse Control:  Good   Risk Assessment: Danger to Self: Yes.  without intent/plan Self-injurious Behavior: No Danger to Others: No Physical Aggression / Violence: No Duty to Warn: No Access to Firearms a concern: No  Assessment of progress:   regressing  Diagnosis:   ICD-10-CM   1. Bipolar I disorder, most recent episode depressed, severe without psychotic features ([place])  F31.4     2. PTSD (post-traumatic stress disorder)  F43.10     3. 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 Social anxiety disorder  F40.10     5. Tardive dyskinesia  G24.01      Plan:  Maintain pledge of safety Add B complex and omega 3, subject to medical approval.  Maintain vitamin D. Inquire with psychiatry about restoring Ingrezza to tame TD sxs. May look into PHS or IOP. Other recommendations/advice as may be noted above Continue to utilize previously learned skills ad lib Maintain medication as prescribed and work faithfully with relevant prescriber(s) if any changes are desired or seem indicated Call the clinic on-call service, present to ER, or call 911 if any life-threatening psychiatric crisis Return 2/wk, unless engaging hospital program. Already scheduled visit in this office 10/09/2020.  [name], PhD [name], PhD LP Clinical Psychologist, [facility] [name], [place], [place] [address] [phone]

## 2020-10-04 ENCOUNTER — Ambulatory Visit (INDEPENDENT_AMBULATORY_CARE_PROVIDER_SITE_OTHER): Payer: Medicare Other | Admitting: Psychiatry

## 2020-10-04 ENCOUNTER — Other Ambulatory Visit: Payer: Self-pay

## 2020-10-04 DIAGNOSIS — R41840 Attention and concentration deficit: Secondary | ICD-10-CM | POA: Diagnosis not present

## 2020-10-04 DIAGNOSIS — F4010 Social phobia, unspecified: Secondary | ICD-10-CM

## 2020-10-04 DIAGNOSIS — F4310 Post-traumatic stress disorder, unspecified: Secondary | ICD-10-CM | POA: Diagnosis not present

## 2020-10-04 DIAGNOSIS — Z630 Problems in relationship with spouse or partner: Secondary | ICD-10-CM

## 2020-10-04 DIAGNOSIS — N184 Chronic kidney disease, stage 4 (severe): Secondary | ICD-10-CM

## 2020-10-04 DIAGNOSIS — F314 Bipolar disorder, current episode depressed, severe, without psychotic features: Secondary | ICD-10-CM | POA: Diagnosis not present

## 2020-10-04 DIAGNOSIS — U099 Post covid-19 condition, unspecified: Secondary | ICD-10-CM

## 2020-10-04 DIAGNOSIS — G2401 Drug induced subacute dyskinesia: Secondary | ICD-10-CM

## 2020-10-04 DIAGNOSIS — Z961 Presence of intraocular lens: Secondary | ICD-10-CM

## 2020-10-04 NOTE — Progress Notes (Addendum)
Psychotherapy Progress Note [facility] Tiffany [name]  Patient ID: Tiffany [name]     MRN: [id number] Therapy format: Individual psychotherapy Date: 10/04/2020      Start: 6:14p     Stop: 7:03p     Time Spent: 49 min Location: In-person   Session narrative (presenting needs, interim history, self-report of stressors and symptoms, applications of prior therapy, status changes, and interventions made in session) Glad to have 2nd session so close, been rocky of late.  Feels the loneliness's, hopelessness, and suicidal ideation tailing off lately -- was SI q 15 min, now SI q 2hrs.  Knows if she ever had a gun it would have been used many times over, on herself and maybe several others.    Not clear whether there was opportunity for contact comfort with Tiffany [name], but put out with him some today.  When she shared some discouraging news, he seemed to play devil's advocate, trying to change her perceptions instead of just receiving it.  Felt hurt, but did not hole up.  Expressed to Tiffany [name] how it hurt her, and got an apology.  On one level, says she is out of the woods today on SI, but then also says she was just "postponing" it, earlier, until Tiffany [name] got through her job interview, which she has.  Still knows she has an impulse that could just take over, and she has for many years struggled with bouts of suicidal feeling.  Framed personal support and tuning it up as a strong path to feeling worth living, since she does put so much stock in relationships and acceptance.  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    Therapeutic modalities: Cognitive Behavioral Therapy, Solution-Oriented/Positive Psychology, Ego-Supportive, and Assertiveness/Communication  Mental Status/Observations:  Appearance:   Casual and Neat     Behavior:   Appropriate  Motor:  slowed  Speech/Language:   Clear and Coherent  Affect:  Appropriate  Mood:  depressed  Thought process:  normal  Thought content:    Rumination  Sensory/Perceptual disturbances:    WNL  Orientation:  Fully oriented  Attention:  Good    Concentration:  Fair  Memory:  WNL  Insight:    Fair  Judgment:   Good  Impulse Control:  Good   Risk Assessment: Danger to Self: Yes.  without intent/plan Self-injurious Behavior: No Danger to Others: No Physical Aggression / Violence: No Duty to Warn: No Access to Firearms a concern: No  Assessment of progress:  stabilized  Diagnosis:   ICD-10-CM   1. Bipolar I disorder, most recent episode depressed, severe without psychotic features ([place])  F31.4     2. PTSD (post-traumatic stress disorder)  F43.10     3. 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 Social anxiety disorder  F40.10     5. Tardive dyskinesia  G24.01     6. Recent artificial lens replacement  Z96.1     7. Relationship problem between partners  Z63.0     8. CKD (chronic kidney disease) stage 4, GFR 15-29 ml/min (HCC) by history  N18.4      Plan:  Recommend engaging [name] for a "do over" at [facility], see through the  project of assertiveness and teaching/recruiting others before judging  Follow through on eye care and other medical issues for general wellbeing May be able to resume alpha blocker.  Meanwhile, suggest adding omega 3 and B complex for ongoing brain heath support and better resistance to cognitive difficulties and depression. Should probably consider medication adjustment, possibly alpha blocker, even though frank nightmares and dissociative flashback sxs are down Other recommendations/advice as may be noted above Continue to utilize previously learned skills ad lib Maintain medication as prescribed and work faithfully with relevant prescriber(s)  if any changes are desired or seem indicated Call the clinic on-call service, present to ER, or call 911 if any life-threatening psychiatric crisis Return 1-2 per week, for session(s) already scheduled. Already scheduled visit in this office 10/09/2020.  Tiffany Serve, PhD Tiffany [name] Clinical Psychologist, [facility] [address] [place], [place] [address] [phone]

## 2020-10-06 ENCOUNTER — Other Ambulatory Visit: Payer: Self-pay

## 2020-10-06 ENCOUNTER — Ambulatory Visit (INDEPENDENT_AMBULATORY_CARE_PROVIDER_SITE_OTHER): Payer: Medicare Other | Admitting: Psychiatry

## 2020-10-06 DIAGNOSIS — U099 Post covid-19 condition, unspecified: Secondary | ICD-10-CM

## 2020-10-06 DIAGNOSIS — F5105 Insomnia due to other mental disorder: Secondary | ICD-10-CM

## 2020-10-06 DIAGNOSIS — N184 Chronic kidney disease, stage 4 (severe): Secondary | ICD-10-CM

## 2020-10-06 DIAGNOSIS — F314 Bipolar disorder, current episode depressed, severe, without psychotic features: Secondary | ICD-10-CM | POA: Diagnosis not present

## 2020-10-06 DIAGNOSIS — R41840 Attention and concentration deficit: Secondary | ICD-10-CM | POA: Diagnosis not present

## 2020-10-06 DIAGNOSIS — F99 Mental disorder, not otherwise specified: Secondary | ICD-10-CM

## 2020-10-06 DIAGNOSIS — F4010 Social phobia, unspecified: Secondary | ICD-10-CM

## 2020-10-06 DIAGNOSIS — Z630 Problems in relationship with spouse or partner: Secondary | ICD-10-CM

## 2020-10-06 DIAGNOSIS — F4310 Post-traumatic stress disorder, unspecified: Secondary | ICD-10-CM

## 2020-10-06 DIAGNOSIS — G2401 Drug induced subacute dyskinesia: Secondary | ICD-10-CM
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
## 2020-10-09 ENCOUNTER — Telehealth: Payer: Self-pay | Admitting: Adult Health

## 2020-10-09 ENCOUNTER — Ambulatory Visit (INDEPENDENT_AMBULATORY_CARE_PROVIDER_SITE_OTHER): Payer: Medicare Other | Admitting: Psychiatry

## 2020-10-09 ENCOUNTER — Other Ambulatory Visit: Payer: Self-pay

## 2020-10-09 DIAGNOSIS — F4310 Post-traumatic stress disorder, unspecified: Secondary | ICD-10-CM

## 2020-10-09 DIAGNOSIS — F99 Mental disorder, not otherwise specified: Secondary | ICD-10-CM

## 2020-10-09 DIAGNOSIS — F5105 Insomnia due to other mental disorder: Secondary | ICD-10-CM

## 2020-10-09 DIAGNOSIS — R41840 Attention and concentration deficit: Secondary | ICD-10-CM | POA: Diagnosis not present

## 2020-10-09 DIAGNOSIS — F4010 Social phobia, unspecified: Secondary | ICD-10-CM

## 2020-10-09 DIAGNOSIS — F314 Bipolar disorder, current episode depressed, severe, without psychotic features: Secondary | ICD-10-CM | POA: Diagnosis not present

## 2020-10-09 DIAGNOSIS — G2401 Drug induced subacute dyskinesia: Secondary | ICD-10-CM

## 2020-10-09 DIAGNOSIS — U099 Post covid-19 condition, unspecified: Secondary | ICD-10-CM

## 2020-10-09 DIAGNOSIS — Z961 Presence of intraocular lens: Secondary | ICD-10-CM

## 2020-10-09 NOTE — Telephone Encounter (Signed)
She can go ahead and start the medication.

## 2020-10-09 NOTE — Telephone Encounter (Signed)
LVM

## 2020-10-09 NOTE — Telephone Encounter (Signed)
Tiffany [name] came in for appt with AM and had a question about starting a new medication. She would like to know if RM would can start her on Prazosin. Pls RTC to discuss [phone].

## 2020-10-09 NOTE — Telephone Encounter (Signed)
Pt stated you prescribed her this awhile back and she could not take it due to a surgery she had at the time.She wants to know if she can start med now
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
## 2020-10-10 ENCOUNTER — Encounter (HOSPITAL_BASED_OUTPATIENT_CLINIC_OR_DEPARTMENT_OTHER): Payer: Self-pay | Admitting: Physical Therapy

## 2020-10-10 ENCOUNTER — Ambulatory Visit (HOSPITAL_BASED_OUTPATIENT_CLINIC_OR_DEPARTMENT_OTHER): Payer: Medicare Other | Attending: Physician Assistant | Admitting: Physical Therapy

## 2020-10-10 DIAGNOSIS — R2689 Other abnormalities of gait and mobility: Secondary | ICD-10-CM

## 2020-10-10 DIAGNOSIS — M6281 Muscle weakness (generalized): Secondary | ICD-10-CM | POA: Insufficient documentation

## 2020-10-10 DIAGNOSIS — R2681 Unsteadiness on feet: Secondary | ICD-10-CM

## 2020-10-11 ENCOUNTER — Encounter (HOSPITAL_BASED_OUTPATIENT_CLINIC_OR_DEPARTMENT_OTHER): Payer: Self-pay | Admitting: Physical Therapy

## 2020-10-11 NOTE — Therapy (Signed)
[place], [name], [address] Phone: [phone]   Fax:  [phone]  Physical Therapy Treatment  Patient Details  Name: Tiffany [name] MRN: [id number] Date of Birth: 07-23-1950 Referring Provider (PT): Inda Coke, [place]   Encounter Date: 10/10/2020   PT End of Session - 10/10/20 1610     Visit Number 2    Number of Visits 12    Date for PT Re-Evaluation 10/18/20    Authorization Type Medicare    Progress Note Due on Visit 10    PT Start Time 1600    PT Stop Time 1642    PT Time Calculation (min) 42 min    Activity Tolerance Patient tolerated treatment well    Behavior During Therapy [facility] for tasks assessed/performed             Past Medical History:  Diagnosis Date   Bipolar 1 disorder ([place])    Breast cancer ([place])    Chronic diarrhea    loose stools twice a day on average for years   Chronic kidney disease (CKD), stage III (moderate) ([place])    Depression 1987   Hospitalization or health care facility admission within last 6 months 04/2019   for fall/seizures   Malignant neoplasm of overlapping sites of left breast in female, estrogen receptor positive ([place]) 03/14/2016   Dx in 09/2014, s/p bilateral mastectomies and ALND, 0/10 LN. 1.4 cm  Grade I invasive lobular, ER and PR +/Her--, Ki 67 <5% Tried anastrozole for one month, but developed suicidal idea   Osteoarthritis, knee 09/24/2019   Xray 09/2019   Parkinson's disease ([place]) 2012   Psoriatic arthritis ([place])    PTSD (post-traumatic stress disorder)    Secondary hyperparathyroidism ([place])    Tardive dyskinesia     Past Surgical History:  Procedure Laterality Date   [place] Bilateral 09/19/2014   TONSILLECTOMY  1970   URETERAL REIMPLANTION Bilateral 1974    There were no vitals filed for this visit.   Subjective Assessment - 10/10/20  1605     Subjective Patient has had back to back cataract surgeries so she hasn't been to PT. She has been doing her exercises somewhat.    Limitations Standing;Walking;House hold activities    How long can you sit comfortably? no issues    How long can you stand comfortably? Could be immediate or an hour. For sure after an hour.    How long can you walk comfortably? has to use a device for limited distances    Patient Stated Goals Getting stronger, improving balance, pain free    Currently in Pain? No/denies                               [facility] Adult PT Treatment/Exercise - 10/11/20 0001       High Level Balance   High Level Balance Comments rhomberg balance 3 sets by 20 secs, 3 sets by 20 secs eyes closed. tandem gait with eyes open 3 sets by 20 secs. marching in place with emphasis on gait.      Exercises   Exercises Knee/Hip;Shoulder      Lumbar Exercises: Stretches   Active Hamstring Stretch Limitations seated    Passive Hamstring Stretch Left;Right;2 reps;20 seconds    Piriformis Stretch 2 reps;30 seconds  Piriformis Stretch Limitations across body, glute stretch      Lumbar Exercises: Aerobic   Other Aerobic Exercise Lvl 2, 5 mins      Lumbar Exercises: Standing   Row Strengthening;Both;Theraband;20 reps    Theraband Level (Row) Level 3 (Green)    Row Limitations 2x10    Shoulder Extension Strengthening;Both;Theraband;20 reps    Theraband Level (Shoulder Extension) Level 2 (Red)    Shoulder Extension Limitations 2x10      Lumbar Exercises: Seated   Other Seated Lumbar Exercises LAQ 3x10 red; marching 3x10 red; hip abduction 3x10 red.                    PT Education - 10/10/20 1612     Education Details Patient education on HEP and balance excercises.    Person(s) Educated Patient    Methods Demonstration;Explanation;Tactile cues;Verbal cues    Comprehension Verbalized understanding;Returned demonstration;Verbal cues  required;Tactile cues required              PT Short Term Goals - 09/06/20 1030       PT SHORT TERM GOAL #1   Title Pt will improve tug with rollater below 14 secs to show improvement in ambulation.    Baseline 22 seconds    Time 4    Period Weeks    Status New    Target Date 09/27/20      PT SHORT TERM GOAL #2   Title Patient will improve strength for bilateral hip flexion to 5/5.    Baseline 4/5    Time 4    Period Weeks    Status New    Target Date 10/04/20      PT SHORT TERM GOAL #3   Title Patient will performed 5x sit to stand below 16 secs to show improvement is functional strenght.    Baseline 22 seconds    Time 4    Period Weeks    Status New    Target Date 10/04/20               PT Long Term Goals - 09/06/20 1040       PT LONG TERM GOAL #1   Title Pt will be be able to perform six minute walk test and ambulate 700 feet without a device or with a less restricted device such as a cane independently in order to ambualte in the community    Time 8    Period Weeks    Status New    Target Date 11/01/20      PT LONG TERM GOAL #2   Title Pt will be able to ambulate 6 steps decreased fatigue and correct mechanics in order to get in and out of her house safely    Baseline 3 steps    Time 8    Period Weeks    Status New    Target Date 11/01/20      PT LONG TERM GOAL #3   Title Patient will be independent with pool program and general strengthening program in order to continue to progress mobility    Time 8    Period Weeks    Status New    Target Date 11/01/20                   Plan - 10/10/20 1611     Clinical Impression Statement Patient tolerated treatment well. Therapy updated her HEP. She was given stretches for hier hips and lower back. She was also  given UE core exercises for home. She also worked on balance exercises. She tolerated balance exercises well. We will ccontinue to advance difficulty of balance exercises as tolerated.     Personal Factors and Comorbidities Fitness;Time since onset of injury/illness/exacerbation;Comorbidity 3+    Comorbidities multiple pain locations, back, hip, knee, Decreased mobility post covid and hospitalization last year,    Examination-Activity Limitations Stand;Locomotion Level;Bend;Dressing;Squat;Stairs    Examination-Participation Restrictions Cleaning;Meal Prep;[place] Work;Community Activity;Shop;Laundry    Stability/Clinical Decision Making Evolving/Moderate complexity    Clinical Decision Making Moderate    Rehab Potential Good    PT Frequency 2x / week    PT Duration 6 weeks    PT Treatment/Interventions ADLs/Self Care Home Management;Cryotherapy;Electrical Stimulation;Ultrasound;Traction;Moist Heat;Iontophoresis 4mg /ml Dexamethasone;DME Instruction;Gait training;Stair training;Functional mobility training;Therapeutic activities;Therapeutic exercise;Orthotic Fit/Training;Patient/family education;Neuromuscular re-education;Balance training;Manual techniques;Taping;Dry needling;Passive range of motion;Spinal Manipulations;Joint Manipulations;Aquatic Therapy;Vasopneumatic Device    PT Next Visit Plan begin porgresisve strengthening in the pool. Be aware of mobility in and out of the pool. Work on balance exercises. Genral strengthening of the lower extremitys;    PT Home Exercise Plan Access Code: [email or web address] [facility]  URL: [email or web address]  Date: 09/06/2020  Prepared by: Carolyne Littles    Exercises  Seated Knee Extension with Resistance - 1 x daily - 7 x weekly - 3 sets - 10 reps  Seated March with Resistance - 1 x daily - 7 x weekly - 3 sets - 10 reps  Seated Hip Abduction with Resistance - 1 x daily - 7 x weekly - 3 sets - 10 reps    Consulted and Agree with Plan of Care Patient             Patient will benefit from skilled therapeutic intervention in order to improve the following deficits and impairments:  Abnormal gait, Pain, Increased muscle spasms, Decreased activity  tolerance, Decreased endurance, Decreased range of motion, Decreased strength, Impaired flexibility, Difficulty walking, Decreased balance, Decreased safety awareness  Visit Diagnosis: Other abnormalities of gait and mobility  Unsteadiness on feet  Muscle weakness (generalized)     Problem List Patient Active Problem List   Diagnosis Date Noted   Osteoarthritis, knee 09/24/2019   CKD (chronic kidney disease) stage 4, GFR 15-29 ml/min ([place]) 09/24/2019   Severe recurrent major depression without psychotic features ([place]) 09/09/2019   Bipolar I disorder, most recent episode depressed ([place])    Transaminitis    Essential hypertension    Encephalopathy 04/26/2019   Leukocytosis 04/21/2019   Thrombocytopenia ([place]) 09/04/2018   Vitamin D deficiency 09/04/2018   Psoriatic arthritis ([place]) 09/04/2018   Tubular adenoma of colon 05/28/2017   History of colonic polyps 05/28/2017   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    [facility] [address] [place], [name], [address] Phone: [phone]   Fax:  [phone]  Name: Victorian Gunn MRN: [id number] Date of Birth: 1951/02/23

## 2020-10-14 ENCOUNTER — Other Ambulatory Visit: Payer: Self-pay | Admitting: Adult Health

## 2020-10-14 DIAGNOSIS — F411 Generalized anxiety disorder: Secondary | ICD-10-CM

## 2020-10-16 ENCOUNTER — Other Ambulatory Visit: Payer: Self-pay

## 2020-10-16 ENCOUNTER — Ambulatory Visit (INDEPENDENT_AMBULATORY_CARE_PROVIDER_SITE_OTHER): Payer: Medicare Other | Admitting: Psychiatry

## 2020-10-16 DIAGNOSIS — R41840 Attention and concentration deficit: Secondary | ICD-10-CM | POA: Diagnosis not present

## 2020-10-16 DIAGNOSIS — Z961 Presence of intraocular lens: Secondary | ICD-10-CM

## 2020-10-16 DIAGNOSIS — F5105 Insomnia due to other mental disorder: Secondary | ICD-10-CM | POA: Diagnosis not present

## 2020-10-16 DIAGNOSIS — G2401 Drug induced subacute dyskinesia: Secondary | ICD-10-CM

## 2020-10-16 DIAGNOSIS — F4310 Post-traumatic stress disorder, unspecified: Secondary | ICD-10-CM | POA: Diagnosis not present

## 2020-10-16 DIAGNOSIS — Z638 Other specified problems related to primary support group: Secondary | ICD-10-CM

## 2020-10-16 DIAGNOSIS — U099 Post covid-19 condition, unspecified: Secondary | ICD-10-CM

## 2020-10-16 DIAGNOSIS — F99 Mental disorder, not otherwise specified: Secondary | ICD-10-CM

## 2020-10-16 DIAGNOSIS — F319 Bipolar disorder, unspecified: Secondary | ICD-10-CM | POA: Diagnosis not present

## 2020-10-16 NOTE — Progress Notes (Signed)
Psychotherapy Progress Note [facility] [name], PhD LP  Patient ID: Tiffany [name]     MRN: [id number] Therapy format: Individual psychotherapy Date: 10/16/2020      Start: 2:11p     Stop: 3:01p     Time Spent: 50 min Location: In-person   Session narrative (presenting needs, interim history, self-report of stressors and symptoms, applications of prior therapy, status changes, and interventions made in session) Arrives again in sunglasses.  Has developed rebound inflammation in her left eye (the first lens replacement), so 4/day steroid drops.  Continues treating right eye (antibiotic + steroid) for the scratched cornea.  Never asked the eye doctor why she got switched, but then never felt the need to after discussed here.    Felt badly after last week's session, feels she came across overly negative, but this was not apparent.    Has been using the practice of counting blessings before bed, finds herself hardly remembering what it was like to be in the negative mindset.  She asked [name] at what point he remembers her manifesting psychiatric problems -- 70yo, when she had a hysterectomy (sans ovaries) as recommended back then.  Within 4 months became suicidally depressed, saw first psychiatrist, got on an antidepressant, and tipped her hand about suicidality by calling him angry that he had tried to snow her about how much it would take to die.  Got forced to the hospital that day.    Tomorrow, flying to [place] with [name] to visit sister [name] who is in assisted living with terminal lung cancer and has a PET scan coming this week.  First air travel since moving from [place].  Looking forward to the outing and the chance to go help sister.  Joked that she won't have to worry about H's fast driving that way, either, to which she spontaneously said, "Wheee", emulating the idea we had earlier for verbalizing otherwise-suppressed reactions as a passenger.  Re. family, got in  touch with [name] and had the grandchildren over on request this weekend.  Included 3-hour visit with son Octavia Bruckner, pleased he could just visit, they didn't have to focus on household needs or  getting things done.  [name] has now been offered another job, even after turning down the private school that wanted her.  Proud and hopeful.  Sleep has still been difficult, broken up by some vivid dreams, often of responsibilities.  Did start prazosin a week ago, and frank NMs and dread feeling are down.  SI has gone away at this point, having weathered the episode, practiced speaking up for her needs, and adjusted medication.  Therapeutic modalities: Cognitive Behavioral Therapy, Solution-Oriented/Positive Psychology, Ego-Supportive, Faith-sensitive, and Interpersonal  Mental Status/Observations:  Appearance:   Casual and Neat     Behavior:  Appropriate  Motor:  Normal, with some lip smacking  Speech/Language:   Clear and Coherent  Affect:  Appropriate  Mood:  brightening  Thought process:  normal  Thought content:    WNL  Sensory/Perceptual disturbances:    WNL  Orientation:  Fully oriented  Attention:  Good    Concentration:  Good  Memory:  WNL  Insight:    Good  Judgment:   Good  Impulse Control:  Good   Risk Assessment: Danger to Self: No Self-injurious Behavior: No Danger to Others: No Physical Aggression / Violence: No Duty to Warn: No Access to Firearms a concern: No  Assessment of progress:  progressing  Diagnosis:   ICD-10-CM   1.  Bipolar I disorder ([place])  F31.9     2. PTSD (post-traumatic stress disorder)  F43.10     3. Insomnia due to other mental disorder  F51.05    F99     4. 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 Tardive dyskinesia  G24.01     6. Recent artificial lens replacement with complications  D28.9     7. Relationship problem with family member  Z63.8      Plan:  Continue positive morale  practices, including daily acknowledgments and counting blessings before bed Continue constructive communication practices with family members and seeking just-visiting time where able Continue willingness to ask questions where needed of family, health care, and others As needed use positive imagery  Other recommendations/advice as may be noted above Continue to utilize previously learned skills ad lib Maintain medication as prescribed and work faithfully with relevant prescriber(s) if any changes are desired or seem indicated Call the clinic on-call service, present to ER, or call 911 if any life-threatening psychiatric crisis Return in about 1 week (around 10/23/2020) for session(s) already scheduled. Already scheduled visit in this office 10/23/2020.  [name], PhD [name], PhD LP Clinical Psychologist, [facility] [address] [place],  [address] [phone]

## 2020-10-17 ENCOUNTER — Other Ambulatory Visit: Payer: Self-pay

## 2020-10-17 NOTE — Telephone Encounter (Signed)
Last filled 7/7 last seen on 7/6

## 2020-10-19 NOTE — Telephone Encounter (Signed)
Please send this script in ASAP. They asked on 8/ 6 and still not filled. Pt called today. She is out

## 2020-10-20 NOTE — Telephone Encounter (Signed)
Please approve

## 2020-10-23 ENCOUNTER — Ambulatory Visit (INDEPENDENT_AMBULATORY_CARE_PROVIDER_SITE_OTHER): Payer: Medicare Other | Admitting: Psychiatry

## 2020-10-23 ENCOUNTER — Other Ambulatory Visit: Payer: Self-pay

## 2020-10-23 DIAGNOSIS — U099 Post covid-19 condition, unspecified: Secondary | ICD-10-CM

## 2020-10-23 DIAGNOSIS — R41840 Attention and concentration deficit: Secondary | ICD-10-CM

## 2020-10-23 DIAGNOSIS — Z638 Other specified problems related to primary support group: Secondary | ICD-10-CM

## 2020-10-23 DIAGNOSIS — G2401 Drug induced subacute dyskinesia: Secondary | ICD-10-CM

## 2020-10-23 DIAGNOSIS — F4010 Social phobia, unspecified: Secondary | ICD-10-CM

## 2020-10-23 DIAGNOSIS — F4310 Post-traumatic stress disorder, unspecified: Secondary | ICD-10-CM

## 2020-10-23 DIAGNOSIS — F319 Bipolar disorder, unspecified: Secondary | ICD-10-CM | POA: Diagnosis not present

## 2020-10-23 DIAGNOSIS — Z961 Presence of intraocular lens: Secondary | ICD-10-CM

## 2020-10-23 NOTE — Progress Notes (Signed)
Psychotherapy Progress Note [facility] [name], PhD LP  Patient ID: Tiffany [name]     MRN: [id number] Therapy format: Individual psychotherapy Date: 10/23/2020      Start: 11:20a     Stop: 12:10p     Time Spent: 50 min Location: In-person   Session narrative (presenting needs, interim history, self-report of stressors and symptoms, applications of prior therapy, status changes, and interventions made in session) Went to [place] as planned, accompanied [name] to oncology, learned she has a brain tumor that has grown and she will have MRI and combination of radiation, chemo, and immunotherapy.  Witnessed [name] wanting to try to exempt herself from hearing out the doctor and suggest just tell family, but persuaded her to be present.    Travel itself was full of PTSD triggers, with crowded [place] airport, fear of being trampled, knocked over.  Felt "abused" in fact, and angry, by conditions.  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    Last night began to look up [employer].  Good insights on empathizing, wants to put into practice with [name].  Discussed ways to merge "good idea" support (ideas she would offer, suggest) with empathy, primarily by owning perspective ("Seems to me...") and by asking consent to move into ideas or suggestions before offering them.  (No kibitzing standard.)  HX that [name] went through [place], had chemotherapy, c. 17 years ago.  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  HX [place] breast cancer and mastectomy.  Still fears something happening to [facility], e.g., if he is a few minutes late getting back from the store.  Acknowledges fear of abandonment, conditioned by 70yo experience of mother leaving her out of the car.  Currently, [name] is doing a great job of being sensitive, offering listening and touch, and asking consent before touch.  [name] greatly appreciates it.   Affirmed assertiveness that led to it being so, and change of perspective from when she was negative and reading controlling motives.  Discussed further norm-setting for communication with [facility].  Therapeutic modalities: Cognitive Behavioral Therapy, Solution-Oriented/Positive Psychology, Customer service manager, and Faith-sensitive  Mental Status/Observations:  Appearance:   Casual and Neat     Behavior:  Appropriate  Motor:  Slowed, baseline  Speech/Language:   Clear and Coherent  Affect:  Appropriate  Mood:  constricted  Thought process:  normal  Thought content:    WNL  Sensory/Perceptual disturbances:    WNL  Orientation:  Fully oriented  Attention:  Good    Concentration:  Good  Memory:  WNL  Insight:    Fair  Judgment:   Good  Impulse Control:  Good   Risk Assessment: Danger to Self:  recent serious SI  Self-injurious Behavior: No Danger to Others: No Physical Aggression / Violence: No Duty to Warn: No Access to Firearms a concern: No  Assessment of progress:  progressing  Diagnosis:   ICD-10-CM   1. Bipolar I disorder ([place])  F31.9     2. PTSD (post-traumatic stress disorder)  F43.10     3. 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 Tardive dyskinesia  G24.01     5. Recent artificial lens replacement with complications  W41.3     6. Social anxiety disorder  F40.10     7. Relationship problem with family member  Z60.8  Plan:  Continue making use of improved relationship with [name], continue norm-setting As able, make open to H and D about any issues in the household, practice open safe/open exchange to better enact and confirm that family life is substantially safer than childhood and boundaries can be worked out without drama or fear Continue positive morale practices, including daily acknowledgments and counting blessings before bed Continue constructive communication practices with family members  and seeking just-visiting time where able Continue willingness to ask questions where needed of family, health care, and others Other recommendations/advice as may be noted above Continue to utilize previously learned skills ad lib Maintain medication as prescribed and work faithfully with relevant prescriber(s) if any changes are desired or seem indicated Call the clinic on-call service, present to ER, or call 911 if any life-threatening psychiatric crisis Return in about 1 week (around 10/30/2020). Already scheduled visit in this office 10/25/2020.  [name], PhD [name], PhD LP Clinical Psychologist, [facility] [address] [place], [place] [address] [phone]

## 2020-10-25 ENCOUNTER — Ambulatory Visit: Payer: Medicare Other | Admitting: Adult Health

## 2020-11-01 ENCOUNTER — Other Ambulatory Visit: Payer: Self-pay

## 2020-11-01 ENCOUNTER — Ambulatory Visit (INDEPENDENT_AMBULATORY_CARE_PROVIDER_SITE_OTHER): Payer: Medicare Other | Admitting: Psychiatry

## 2020-11-01 DIAGNOSIS — R41840 Attention and concentration deficit: Secondary | ICD-10-CM

## 2020-11-01 DIAGNOSIS — Z961 Presence of intraocular lens: Secondary | ICD-10-CM

## 2020-11-01 DIAGNOSIS — Z638 Other specified problems related to primary support group: Secondary | ICD-10-CM

## 2020-11-01 DIAGNOSIS — F4310 Post-traumatic stress disorder, unspecified: Secondary | ICD-10-CM | POA: Diagnosis not present

## 2020-11-01 DIAGNOSIS — U099 Post covid-19 condition, unspecified: Secondary | ICD-10-CM

## 2020-11-01 DIAGNOSIS — F3132 Bipolar disorder, current episode depressed, moderate: Secondary | ICD-10-CM | POA: Diagnosis not present

## 2020-11-01 NOTE — Progress Notes (Signed)
Psychotherapy Progress Note [facility] [name], PhD LP  Patient ID: Tiffany [name]     MRN: [id number] Therapy format: Family therapy w/ patient -- accompanied by Tiffany [name] Date: 11/01/2020      Start: 9:20a     Stop: 10:07a     Time Spent: 47 min Location: In-person   Session narrative (presenting needs, interim history, self-report of stressors and symptoms, applications of prior therapy, status changes, and interventions made in session) Had another eye procedure.  Wants Tiffany [name] involved today, in the wake of some agitation/irritability she didn't understand much.    Reviewed recent experiences, advising Tiffany [name] to assume that if it's not clear, maybe it is just venting.  Sometimes it is about his hearing deficit.  Yesterday involved triggering experiences like being left long in a dark room at the eye doctor's, and being uninformed before the procedure, which drove up her anxiety and [name].  Felt the eye doctor, who was the original, was artificial and tentative, probably for having heard that she asked the other doctor why the change and had introduced herself as having felt anxious and left in the dark by the change.  Affirmed that it is possible she was projecting, possible the doctor was, too.  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  Serendipitous opportunity for her that her therapist found.  Stress involved listening to her and empathizing, better now that her mission to get work is succeeding.  The couple feel boundaries are clear enough about household responsibilities, and Tiffany [name] has been over POA and executor-relevant items in recent times.  Tiffany [name] has also made clear that she does not have to hold medications for suicide risk.  Affirmed and confirmed with Tiffany [name], who was not fully aware that she had felt that dangerous.  Tiffany [name] spontaneously notes she was sorry to find out that she could not get  Fentanyl to OD on and that the medication she had would not be enough.  Reaffirmed willingness to live at present, and benefit of current strategies.  Overall, is in touch and affirming of reasons to live.  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    Therapeutic modalities: Cognitive Behavioral Therapy, Solution-Oriented/Positive Psychology, Customer service manager, and Faith-sensitive  Mental Status/Observations:  Appearance:   Casual and Neat     Behavior:  Appropriate  Motor:  Normal  Speech/Language:   Clear and Coherent  Affect:  Appropriate and Constricted  Mood:  normal  Thought process:  normal  Thought content:    Rumination  Sensory/Perceptual disturbances:    WNL  Orientation:  Fully oriented  Attention:  Good    Concentration:  Fair  Memory:  grossly intact  Insight:    Fair  Judgment:   Good  Impulse Control:  Good   Risk Assessment: Danger to Self: No at present, but overlearned fantasy of SI as ultimate answer Self-injurious Behavior: No Danger to Others: No Physical Aggression / Violence: No Duty to Warn: No Access to Firearms a concern: No  Assessment of progress:  progressing  Diagnosis:   ICD-10-CM   1. Bipolar I disorder, most recent episode depressed, moderate ([place])  F31.32     2. PTSD (post-traumatic stress disorder)  F43.10  3. 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 Relationship problem with family member  Z63.8     5. Recent artificial lens replacement with complications  F12.5      Plan:  Continue willingness to put lethal means under others' control and to call for need Continue relating more warmly and openly with Tiffany [name] Option to offer  [name] chance to acknowledge any stress of recent episode Other recommendations/advice as may be noted above Continue to utilize previously learned skills ad lib Maintain medication as prescribed and work faithfully with relevant prescriber(s) if any changes are desired or seem indicated Call the clinic on-call service, present to ER, or call 911 if any life-threatening psychiatric crisis Return in about 1 week (around 11/08/2020). Already scheduled visit in this office 11/08/2020.  [name], PhD [name], PhD LP Clinical Psychologist, [facility] [address], [place], [place] [address] [phone]

## 2020-11-08 ENCOUNTER — Ambulatory Visit (INDEPENDENT_AMBULATORY_CARE_PROVIDER_SITE_OTHER): Payer: Medicare Other | Admitting: Adult Health

## 2020-11-08 ENCOUNTER — Other Ambulatory Visit: Payer: Self-pay

## 2020-11-08 ENCOUNTER — Encounter: Payer: Self-pay | Admitting: Adult Health

## 2020-11-08 DIAGNOSIS — F3132 Bipolar disorder, current episode depressed, moderate: Secondary | ICD-10-CM

## 2020-11-08 DIAGNOSIS — F4310 Post-traumatic stress disorder, unspecified: Secondary | ICD-10-CM | POA: Diagnosis not present

## 2020-11-08 DIAGNOSIS — R41840 Attention and concentration deficit: Secondary | ICD-10-CM

## 2020-11-08 DIAGNOSIS — F99 Mental disorder, not otherwise specified: Secondary | ICD-10-CM

## 2020-11-08 DIAGNOSIS — U099 Post covid-19 condition, unspecified: Secondary | ICD-10-CM

## 2020-11-08 DIAGNOSIS — F4010 Social phobia, unspecified: Secondary | ICD-10-CM | POA: Diagnosis not present

## 2020-11-08 DIAGNOSIS — F5105 Insomnia due to other mental disorder: Secondary | ICD-10-CM

## 2020-11-08 NOTE — Progress Notes (Signed)
Tiffany [name] [id number] 05/02/1950 70 y.o.  Subjective:   Patient ID:  Tiffany [name] Planck is a 70 y.o. (DOB [birth year]) female.  Chief Complaint: No chief complaint on file.   HPI  Accompanied by husband.  Tiffany [name] presents to the office today for follow-up of PTSD, insomnia, GAD, BPD 1.  Describes mood today as "ok". Pleasant. Denies tearfulness. Mood symptoms - reports depression - "on and off - 4 days at a time" Stating "this cycle has happened twice. Feels anxious most the time. Feels like Ativan helps when she remembers to take it. Stating "I get over loaded easily". Feels irritable at times. Denies any recent anger outbursts. Having "good and bad days". Having issues with recent eye surgeries. Has a sister with metastatic cancer. Seeing Dr. Rica Mote weekly. Varying interest and motivation. Taking medications as prescribed.  Energy levels vary with mood. Active, does not have a regular exercise routine.  Enjoys some usual interests and activities. Married. Lives with husband their daughter. Talking to family and friends.  Appetite adequate. Weight stable - "the same". Sleeping better some nights than others. Averages 9 hours of broken sleep - waking up around 3 times a night. Has been taking Prazosin 51m at bedtime for the past 6 weeks - has not seen a noticeable difference. Focus and concentration stable. Completing tasks. Managing some aspects of household. Retired.  Denies SI or HI.  Denies AH or VH.   Previous medications: Celexa, Zyprexa, Tegretol, Depakote, Serzone, Topamax, Seroquel, Effexor, Lexapro, Desipramine, Neurontin, Abilify, Geodon, Propanolol, Cymbalta, Cogentin, Trihexyphenadyl, Sinmmet, Provigil, Selegiline, Requip, Amantadine, Prozac, Mirapex, Azilect, Metoclopramide, Baclofen, Artane, Namenda, Latuda.     GAD-7    Flowsheet Row [facility] [place] [facility]/14/2019 in [facility] Total GAD-7 Score 21      MMany Office Visit from [facility]/07/2019 in [facility] Total Score (max 30 points ) 29      PHQ2-9    Flowsheet Row Counselor [facility]/2020 [facility] [place] [facility] [place] [facility] [place] [facility] [place] [facility]2019 [facility] PHQ-2 Total Score _0 0 0  PHQ-9 Total Score -- 11 3 -- 4      Flowsheet Row [facility]2019 [facility]  C-SSRS RISK CATEGORY High Risk High Risk        Review of Systems:  Review of Systems  Musculoskeletal:  Negative for gait problem.  Neurological:  Negative for tremors.  Psychiatric/Behavioral:         Please refer to HPI   Medications: I have reviewed the patient's current medications.  Current Outpatient Medications  Medication Sig Dispense Refill   amLODipine (NORVASC) 2.5 MG tablet TAKE 1 TABLET(2.5 MG) BY MOUTH DAILY 90 tablet 0   calcitRIOL (ROCALTROL) 0.25 MCG capsule Take 1 capsule (0.25 mcg total) by mouth every Monday, Wednesday, and Friday. 12 capsule 0   Certolizumab Pegol (CIMZIA) 2 X 200 MG KIT      cholecalciferol (VITAMIN D3) 25 MCG (1000 UNIT) tablet Take 1,000 Units by mouth daily.     diphenhydrAMINE (BENADRYL) 50 MG tablet Take 50 mg by mouth at bedtime. Help with sleep     lamoTRIgine (LAMICTAL) 200 MG tablet Take 1 tablet (200 mg total)  by mouth at bedtime. 90 tablet 3   lamoTRIgine (LAMICTAL) 25 MG tablet Take one tablet every morning for 7 days, then increase to two tablets every morning. 60 tablet 2   levothyroxine (SYNTHROID) 112 MCG tablet TAKE 1 TABLET(112 MCG) BY MOUTH DAILY AT 6 AM 90 tablet 0   lithium carbonate 300 MG capsule TAKE 1 CAPSULE(300 MG) BY MOUTH AT BEDTIME 90 capsule 3   LORazepam  (ATIVAN) 1 MG tablet TAKE 1 TABLET(1 MG) BY MOUTH TWICE DAILY AS NEEDED 60 tablet 2   metroNIDAZOLE (METROGEL) 0.75 % gel Apply to face 1-2 times daily 45 g 0   prazosin (MINIPRESS) 1 MG capsule Take 1 capsule (1 mg total) by mouth at bedtime. 30 capsule 2   No current facility-administered medications for this visit.    Medication Side Effects: None  Allergies:  Allergies  Allergen Reactions   Aripiprazole Other (See Comments)    Parkinsonism      Lactose Intolerance (Gi) Diarrhea   Methotrexate Other (See Comments)    Hair loss, severe stomatitis     Cefdinir Diarrhea    Other reaction(s): Diarrhea Yeast infection and fever; negative c diff   Etanercept Other (See Comments)    Headaches     Exemestane Other (See Comments)    Suicidal thoughts with medication     Fluoxetine Other (See Comments)    Parkinsonism   Lactose     Other reaction(s): diarrhea   Methylprednisolone Sodium Succ Other (See Comments)    Agitated mania   Epinephrine Palpitations    tachycardia    Nitrofurantoin Nausea And Vomiting and Rash      Other reaction(s): rash, diarrhea    Past Medical History:  Diagnosis Date   Bipolar 1 disorder (HCC)    Breast cancer ([place])    Chronic diarrhea    loose stools twice a day on average for years   Chronic kidney disease (CKD), stage III (moderate) ([place])    Depression 1987   Hospitalization or health care facility admission within last 6 months 04/2019   for fall/seizures   Malignant neoplasm of overlapping sites of left breast in female, estrogen receptor positive ([place]) 03/14/2016   Dx in 09/2014, s/p bilateral mastectomies and ALND, 0/10 LN. 1.4 cm  Grade I invasive lobular, ER and PR +/Her--, Ki 67 <5% Tried anastrozole for one month, but developed suicidal idea   Osteoarthritis, knee 09/24/2019   Xray 09/2019   Parkinson's disease ([place]) 2012   Psoriatic arthritis ([place])    PTSD (post-traumatic stress disorder)    Secondary hyperparathyroidism  ([place])    Tardive dyskinesia     Past Medical History, Surgical history, Social history, and Family history were reviewed and updated as appropriate.   Please see review of systems for further details on the patient's review from today.   Objective:   Physical Exam:  There were no vitals taken for this visit.  Physical Exam Constitutional:      General: She is not in acute distress. Musculoskeletal:        General: No deformity.  Neurological:     Mental Status: She is alert and oriented to person, place, and time.     Coordination: Coordination normal.  Psychiatric:        Attention and Perception: Attention and perception normal. She does not perceive auditory or visual hallucinations.        Mood and Affect: Mood normal. Mood is not anxious or depressed. Affect is not labile, blunt, angry  or inappropriate.        Speech: Speech normal.        Behavior: Behavior normal.        Thought Content: Thought content normal. Thought content is not paranoid or delusional. Thought content does not include homicidal or suicidal ideation. Thought content does not include homicidal or suicidal plan.        Cognition and Memory: Cognition and memory normal.        Judgment: Judgment normal.     Comments: Insight intact    Lab Review:     Component Value Date/Time   NA 140 09/19/2020 1000   NA 138 06/08/2019 0000   K 4.5 09/19/2020 1000   CL 108 09/19/2020 1000   CO2 25 09/19/2020 1000   GLUCOSE 83 09/19/2020 1000   BUN 28 (H) 09/19/2020 1000   BUN 29 (A) 06/08/2019 0000   CREATININE 2.35 (H) 09/19/2020 1000   CALCIUM 9.5 09/19/2020 1000   PROT 7.0 12/03/2019 1105   ALBUMIN 4.0 09/10/2019 0851   AST 16 04/10/2020 0000   ALT 13 04/10/2020 0000   ALKPHOS 145 (A) 04/10/2020 0000   BILITOT 1.0 12/03/2019 1105   GFRNONAA 22 04/10/2020 0000   GFRAA 25 (L) 09/13/2019 1243       Component Value Date/Time   WBC 12.0 04/10/2020 0000   WBC 13.0 (H) 09/08/2019 1347   RBC 4.95  09/08/2019 1347   HGB 14.4 09/08/2019 1347   HCT 46.0 09/08/2019 1347   PLT 220 09/08/2019 1347   MCV 92.9 09/08/2019 1347   MCH 29.1 09/08/2019 1347   MCHC 31.3 09/08/2019 1347   RDW 13.1 09/08/2019 1347   LYMPHSABS 2.5 04/27/2019 0544   MONOABS 0.5 04/27/2019 0544   EOSABS 0.2 04/27/2019 0544   BASOSABS 0.0 04/27/2019 0544    Lithium Lvl  Date Value Ref Range Status  09/19/2020 0.6 0.6 - 1.2 mmol/L Final     No results found for: PHENYTOIN, PHENOBARB, VALPROATE, CBMZ   .res Assessment: Plan:    Plan:  Therapist - Andy Mitchum   Lorazepam 71m BID for anxiety - make take one tablet extra for severe anxiety symptoms. Taking one at night routinely. Lamictal 2069mat hs/5068mn the morning. D/C morning dose of Lamictal feels unsteady since adding it. Will take 68m76m the morning for 7 days, then eave Lithium 300mg68mly  Increase Prazosin 1mg t30mmg at23mdtime  Labs - lithium level, TSH, CMP - October  RTC 4/6 weeks  Counseled patient regarding potential benefits, risks, and side effects of Lamictal to include potential risk of Stevens-Johnson syndrome. Advised patient to stop taking Lamictal and contact office immediately if rash develops and to seek urgent medical attention if rash is severe and/or spreading quickly.  Discussed potential benefits, risk, and side effects of benzodiazepines to include potential risk of tolerance and dependence, as well as possible drowsiness.  Advised patient not to drive if experiencing drowsiness and to take lowest possible effective dose to minimize risk of dependence and tolerance.   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  Future Appointments  Date Time Provider [email or web address] 11:00 AM MitchumBlanchie ServeP-CP None  11/16/2020  11:00 AM [name], PhD CP-CP None  11/23/2020  1:00 PM [name], PhD CP-CP None  11/27/2020 11:00 AM [name], PhD CP-CP None  12/04/2020  1:00 PM [name], PhD CP-CP None  12/13/2020 10:00 AM Ieshia Hatcher, Berdie Ogren, NP CP-CP None  12/18/2020 10:00 AM [name], PhD CP-CP None  03/22/2021  2:30 PM [facility] HE[facility] COACH [facility]    No orders of the defined types were placed in this encounter.   -------------------------------

## 2020-11-09 ENCOUNTER — Ambulatory Visit (INDEPENDENT_AMBULATORY_CARE_PROVIDER_SITE_OTHER): Payer: Medicare Other | Admitting: Psychiatry

## 2020-11-09 DIAGNOSIS — R41840 Attention and concentration deficit: Secondary | ICD-10-CM

## 2020-11-09 DIAGNOSIS — U099 Post covid-19 condition, unspecified: Secondary | ICD-10-CM

## 2020-11-09 DIAGNOSIS — F4310 Post-traumatic stress disorder, unspecified: Secondary | ICD-10-CM

## 2020-11-09 DIAGNOSIS — F4010 Social phobia, unspecified: Secondary | ICD-10-CM | POA: Diagnosis not present

## 2020-11-09 DIAGNOSIS — F3132 Bipolar disorder, current episode depressed, moderate: Secondary | ICD-10-CM

## 2020-11-09 DIAGNOSIS — Z961 Presence of intraocular lens: Secondary | ICD-10-CM

## 2020-11-09 DIAGNOSIS — Z638 Other specified problems related to primary support group: Secondary | ICD-10-CM

## 2020-11-09 NOTE — Progress Notes (Signed)
Psychotherapy Progress Note [facility] [name], PhD LP  Patient ID: Tiffany [name]     MRN: [id number] Therapy format: Individual psychotherapy Date: 11/09/2020      Start: 11:20a     Stop: 12:08p     Time Spent: 48 min Location: In-person   Session narrative (presenting needs, interim history, self-report of stressors and symptoms, applications of prior therapy, status changes, and interventions made in session) Somewhat upset she lost the notebook she's been keeping therapy thoughts in.  Mixed feelings about session with [name] last time.  Secretly felt a little cheated not getting her 1:1 time, but overall positive, did accomplish understandings together and does feel on the whole better known, received, respected, and loved by him.  Med check yesterday, with [name] assisting, who noted she had a depressive turn last week.  OK with him joining and disclosing.  Feeling mood fluctuations largely out of her control, but aware that family stresses and empathic concerns to drive stress, which drives mood.  Had thought maybe she should come off prazosin for it being ineffective so far, but psychiatry educated her better.  Reviewed further today to better understand taper, effective dosing, and the need to control both the threat of abrupt changes setting off her system and snap judgments undermining treatment experiments.  Grateful to understand better the need to taper on and keep faith.  Sees mental overload a precipitant for depressed mood, and being overstimulated in her senses, e.g., with air travel recently.  Sister [name] now known to have brain mets, in aggressive treatment with radiation, chemo, and immunotherapy.  [name]'s cousin's son [name] committed suicide, considering going to a [employer] this weekend.  Backdrop that [name] watched his mother waste away with myotonic dystrophy, was adopted [place] American child.  Healthy revelation in letting [name] acknowledge that  she struggles with depression/SI in phone call that will take the place of offering condolences in person.  Helped for [name] to be understanding of her reluctance and sensitive to her privacy, and validating to hear him speak accurately for her.  Encouraged she make a point of thanking him for these soon as able, on grounds that depression like hers is often maintained by negative interactions with loved ones, and she can help herself further by rewarding helpful behavior in her husband.  Eye situation still not over.  Had the laser treatment (as noted lat week, without recollection), notes how vulnerable and averse she feels to procedures like this.  Normalized as layers of emotional conditioning which amount to trained fear of any moment that seems to start with the proposition "Hold still while I mess with your body."  Reframed as near-heroic any time she doe brave medical procedures, physical exams, and close contact with people because she is at that moment doing the work of reconditioning fear/anxiety, any time she lets it happen for good reasons of choice.  Therapeutic modalities: Cognitive Behavioral Therapy, Solution-Oriented/Positive Psychology, and Ego-Supportive  Mental Status/Observations:  Appearance:   Casual and Neat     Behavior:  Appropriate  Motor:  Normal  Speech/Language:   Clear and Coherent  Affect:  Appropriate and Constricted  Mood:  depressed  Thought process:  normal  Thought content:    WNL  Sensory/Perceptual disturbances:    WNL  Orientation:  Fully oriented  Attention:  Good    Concentration:  Good  Memory:  WNL  Insight:    Good  Judgment:   Good  Impulse Control:  Good   Risk Assessment: Danger to Self:  persistent SI, not noted today  Self-injurious Behavior: No Danger to Others: No Physical Aggression / Violence: No Duty to Warn: No Access to Firearms a concern: No  Assessment of progress:  progressing  Diagnosis:   ICD-10-CM   1. Bipolar I  disorder, most recent episode depressed, moderate ([place])  F31.32     2. PTSD (post-traumatic stress disorder)  F43.10     3. Social anxiety disorder  F40.10     4. 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 Relationship problem with family member  Z63.8     4. Recent artificial lens replacement with complications  Y34.6      Plan:  Keep faith with eye service, most likely through the worst.  Option to do "reverse customer service" with her eye doctor next opportunity, either setting him at ease that she is satisfied with service and grateful she could ask questions to understand better, or gambling to ask him if there is anything about her as a patient that makes him nervous and then put him at ease Encourage thank husband for being understanding of her feelings and sensitive to her autonomy in talking with relatives recently Consider further the idea that she has a learned aversion to things that resemble molestation and self-affirm any time she braves things that resemble the risk for good reasons Other recommendations/advice as may be noted above Continue to utilize previously learned skills ad lib Maintain medication as prescribed and work faithfully with relevant prescriber(s) if any changes are desired or seem indicated Call the clinic on-call service, present to ER, or call 911 if any life-threatening psychiatric crisis Return in about 1 week (around 11/16/2020) for session(s) already scheduled. Already scheduled visit in this office 11/16/2020.  [name], PhD [name], PhD LP Clinical Psychologist, [facility] [address] [place], [place] [address] [phone]

## 2020-11-15 ENCOUNTER — Ambulatory Visit: Payer: Medicare Other | Admitting: Psychiatry

## 2020-11-16 ENCOUNTER — Other Ambulatory Visit: Payer: Self-pay

## 2020-11-16 ENCOUNTER — Ambulatory Visit (INDEPENDENT_AMBULATORY_CARE_PROVIDER_SITE_OTHER): Payer: Medicare Other | Admitting: Psychiatry

## 2020-11-16 DIAGNOSIS — Z8669 Personal history of other diseases of the nervous system and sense organs: Secondary | ICD-10-CM | POA: Diagnosis not present

## 2020-11-16 DIAGNOSIS — Z961 Presence of intraocular lens: Secondary | ICD-10-CM

## 2020-11-16 DIAGNOSIS — Z630 Problems in relationship with spouse or partner: Secondary | ICD-10-CM | POA: Diagnosis not present

## 2020-11-16 DIAGNOSIS — F4310 Post-traumatic stress disorder, unspecified: Secondary | ICD-10-CM

## 2020-11-16 DIAGNOSIS — N184 Chronic kidney disease, stage 4 (severe): Secondary | ICD-10-CM

## 2020-11-16 DIAGNOSIS — F3132 Bipolar disorder, current episode depressed, moderate: Secondary | ICD-10-CM | POA: Diagnosis not present

## 2020-11-16 NOTE — Progress Notes (Signed)
Psychotherapy Progress Note [facility] [name], PhD LP  Patient ID: Tiffany [name]     MRN: [id number] Therapy format: Family therapy w/ patient -- accompanied by [name] Date: 11/16/2020      Start: 11:14a     Stop: 12:04p     Time Spent: 50 min Location: In-person   Session narrative (presenting needs, interim history, self-report of stressors and symptoms, applications of prior therapy, status changes, and interventions made in session) Depressed but not sure why.  Not sleeping so well.  Does identify son [name]'s long hours and needs for supplementary child care, how [name] and [name] are getting up early for pre-school help at their house and then getting the kids for after-school care.  Getting up at 5am, feels spent by midday.  Made recent adjustment to beg off the morning trek herself, do some cooking behind the scenes, and have [name] work by himself for the morning school readiness routine.  Seems to be less tiring that way.  This morning, also found out [name] went to the ER for tests instead of reporting for her hospital shift, thankfully nothing acute, just seems to be stress effects, had day off.  Discussed today's plans, challenging conclusion-jumping that they could not keep their plans with the kids this afternoon because their mother was home.  As for depressed feeling, interpreted as basic brain fatigue, since she is still in prolonged recovery from her neurological insult earlier this year, and encouraged it's OK to still have limits and need rest sometimes.  Addressed preferences for messaging if the kids want to know why she's not there with [name], agreed it's acceptable just to tell them she's resting, and that it wouldn't make her sound or look like an invalid.  Processed incident where [name] took a 3-day offense to [name] being automatically helpful and seeming to take over a task on her behalf.  Discussed ways both of them could address the issue better going  forward, basically with [name] being mindful of [name]'s dignity and asking before doing a favor if he has any question about it, and [name] standing ready to decipher favors as [facility] love language and that she can also ask him to wait up if moved and let her do.  Therapeutic modalities: Cognitive Behavioral Therapy, Solution-Oriented/Positive Psychology, and Ego-Supportive, communication  Mental Status/Observations:  Appearance:   Casual and Neat     Behavior:  Appropriate  Motor:  Normal  Speech/Language:   Clear and Coherent  Affect:  Appropriate, responsive  Mood:  dysthymic and very responsive  to intervention  Thought process:  concrete  Thought content:    WNL  Sensory/Perceptual disturbances:    WNL  Orientation:  Fully oriented  Attention:  Good    Concentration:  Good  Memory:  WNL  Insight:    Fair  Judgment:   Good  Impulse Control:  Good   Risk Assessment: Danger to Self: No Self-injurious Behavior: No Danger to Others: No Physical Aggression / Violence: No Duty to Warn: No Access to Firearms a concern: No  Assessment of progress:  progressing well  Diagnosis:   ICD-10-CM   1. Bipolar I disorder, most recent episode depressed, moderate ([place])  F31.32     2. PTSD (post-traumatic stress disorder)  F43.10     3. Relationship problem between partners  Z63.0     4. History of metabolic encephalopathy  T55.73     5. History of left frontal infarct, as assessed by neurologist  Z86.69     6. Recent artificial lens replacement with complications  W10.2     7. CKD (chronic kidney disease) stage 4, GFR 15-29 ml/min (HCC) by history  N18.4      Plan:  Apply communication recommendations coordinating with family about child care plans Endorse hybrid schedule helping with the kids As a couple, practice mutually translating each other's ways when it come to favors [name] might do -- avail each other of opportunities to ask, consent, and grant benefit of the doubt rather  than harbor ignorance or resentment Other recommendations/advice as may be noted above Continue to utilize previously learned skills ad lib Maintain medication as prescribed and work faithfully with relevant prescriber(s) if any changes are desired or seem indicated Call the clinic on-call service, 988/hotline, present to ER, or call 911 if any life-threatening psychiatric crisis Return in about 1 week (around 11/23/2020) for session(s) already scheduled. Already scheduled visit in this office 11/23/2020.  [name], PhD [name], PhD LP Clinical Psychologist, [facility] [address], [place], [place] [address] [phone]

## 2020-11-23 ENCOUNTER — Ambulatory Visit (INDEPENDENT_AMBULATORY_CARE_PROVIDER_SITE_OTHER): Payer: Medicare Other | Admitting: Psychiatry

## 2020-11-23 ENCOUNTER — Other Ambulatory Visit: Payer: Self-pay

## 2020-11-23 DIAGNOSIS — Z8669 Personal history of other diseases of the nervous system and sense organs: Secondary | ICD-10-CM | POA: Diagnosis not present

## 2020-11-23 DIAGNOSIS — F4310 Post-traumatic stress disorder, unspecified: Secondary | ICD-10-CM | POA: Diagnosis not present

## 2020-11-23 DIAGNOSIS — R41840 Attention and concentration deficit: Secondary | ICD-10-CM

## 2020-11-23 DIAGNOSIS — Z630 Problems in relationship with spouse or partner: Secondary | ICD-10-CM

## 2020-11-23 DIAGNOSIS — F3132 Bipolar disorder, current episode depressed, moderate: Secondary | ICD-10-CM | POA: Diagnosis not present

## 2020-11-23 DIAGNOSIS — U099 Post covid-19 condition, unspecified: Secondary | ICD-10-CM

## 2020-11-23 NOTE — Progress Notes (Signed)
Psychotherapy Progress Note [facility] [name], PhD LP  Patient ID: Tiffany [name]     MRN: [id number] Therapy format: Family therapy w/ patient -- accompanied by [name] Date: 11/23/2020      Start: 1:08p     Stop: 1:57p     Time Spent: 49 min Location: In-person   Session narrative (presenting needs, interim history, self-report of stressors and symptoms, applications of prior therapy, status changes, and interventions made in session) New word sister's metastatic lung CA is more advanced, just 2 months to live now.  Wants to hear again the 5 messages of goodbye (thanks, love, sorry, forgive, see you later).  Been doing some reminiscing together about the fun things of childhood.  Remembers how [name] was adventuresome, dating, tell her too much about it.  Positive news that Octavia Bruckner and [name] are expecting, a girl, now 3 months along.  ER trip was abdominal pain from kicking, it turned out.  Now planning to have the family over this weekend, hopefully celebrate new life, not just prepare to grieve.  Grandson Aston hx of 9wks premature and surprise sudden birth at an OB exam.    Child care schedule working out.  Fared well last week with the adjustment.  [name] now has 25 days off.  Part of why planning some visit time.  Substantially less worried for the family, just aware of a conflict between Octavia Bruckner and [place] about private school plans.    Energy OK.  Went through a tired time following a driving trip to [place] visit .  Mood fluctuations of late seem tied to events.  H notes this week Mannat trying more actively to do things for herself, and more assertive about it.    Confirmed in an interaction in the waiting room [name]'s growing sensitivity and Idalia's unfolding comfort asserting herself.  Processed recent hard feeling about feeling overtaken by [facility] help -- as discussed last week -- and going 2 days without speaking to him.    Therapeutic modalities: Cognitive Behavioral  Therapy, Solution-Oriented/Positive Psychology, and Ego-Supportive  Mental Status/Observations:  Appearance:   Casual     Behavior:  Appropriate  Motor:  Normal and mildly slowed gait  Speech/Language:   Clear and Coherent  Affect:  Constricted, responsive  Mood:  dysthymic, reveals some irritability not showing  Thought process:  normal  Thought content:    Less rumination  Sensory/Perceptual disturbances:    WNL  Orientation:  Fully oriented  Attention:  Good    Concentration:  Good  Memory:  grossly intact  Insight:    Fair  Judgment:   Good  Impulse Control:  Good   Risk Assessment: Danger to Self:  persistent SI, no intent  Self-injurious Behavior: No Danger to Others: No Physical Aggression / Violence: No Duty to Warn: No Access to Firearms a concern: No  Assessment of progress:  progressing  Diagnosis:   ICD-10-CM   1. Bipolar I disorder, most recent episode depressed, moderate ([place])  F31.32     2. PTSD (post-traumatic stress disorder)  F43.10     3. Relationship problem between partners  Z63.0     4. History of metabolic encephalopathy  Z76.73     5. History of small, chronic left frontal infarct, as assessed by neurologist  Z86.69     6. 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 Trianna giving benefit of the doubt and [name] growing in recognizing and asking if sh wants something done or leave it to her Look into video communication with sister Other recommendations/advice as may be noted above Continue to utilize previously learned skills ad lib Maintain medication as prescribed and work faithfully with relevant prescriber(s) if any changes are desired or seem indicated Call the clinic on-call service, 988/hotline, present to ER, or  call 911 if any life-threatening psychiatric crisis Return for session(s) already scheduled. Already scheduled visit in this office 11/27/2020.  [name], PhD [name], PhD LP Clinical Psychologist, [facility] [address] [place], [place] [address] [phone]

## 2020-11-27 ENCOUNTER — Ambulatory Visit (INDEPENDENT_AMBULATORY_CARE_PROVIDER_SITE_OTHER): Payer: Medicare Other | Admitting: Psychiatry

## 2020-11-27 ENCOUNTER — Other Ambulatory Visit: Payer: Self-pay

## 2020-11-27 ENCOUNTER — Telehealth: Payer: Self-pay | Admitting: Adult Health

## 2020-11-27 ENCOUNTER — Telehealth: Payer: Self-pay

## 2020-11-27 DIAGNOSIS — U099 Post covid-19 condition, unspecified: Secondary | ICD-10-CM

## 2020-11-27 DIAGNOSIS — F3132 Bipolar disorder, current episode depressed, moderate: Secondary | ICD-10-CM | POA: Diagnosis not present

## 2020-11-27 DIAGNOSIS — F4310 Post-traumatic stress disorder, unspecified: Secondary | ICD-10-CM

## 2020-11-27 DIAGNOSIS — F4320 Adjustment disorder, unspecified: Secondary | ICD-10-CM

## 2020-11-27 DIAGNOSIS — R41840 Attention and concentration deficit: Secondary | ICD-10-CM | POA: Diagnosis not present

## 2020-11-27 DIAGNOSIS — Z8669 Personal history of other diseases of the nervous system and sense organs: Secondary | ICD-10-CM

## 2020-11-27 MED ORDER — LEVOTHYROXINE SODIUM 112 MCG PO TABS: ORAL_TABLET | ORAL | 0 refills | Status: DC

## 2020-11-27 MED ORDER — PRAZOSIN HCL 2 MG PO CAPS
2.0000 mg | ORAL_CAPSULE | Freq: Every day | ORAL | 0 refills | Status: DC
Start: 2020-11-27 — End: 2020-12-01

## 2020-11-27 NOTE — Telephone Encounter (Signed)
Ok to send

## 2020-11-27 NOTE — Telephone Encounter (Signed)
Pt called and said that she needs a new script of 2 mg of prazosin. She has been doubling up on the one per gina. [facility] [place]

## 2020-11-27 NOTE — Telephone Encounter (Signed)
LAST APPOINTMENT DATE:  06/05/20  NEXT APPOINTMENT DATE: none  MEDICATION:levothyroxine (SYNTHROID) 112 MCG tablet  PHARMACY:[id number] - [place], [place] - [address]

## 2020-11-27 NOTE — Progress Notes (Signed)
Psychotherapy Progress Note [facility] [name], PhD LP  Patient ID: Tiffany [name]     MRN: [id number] Therapy format: Individual psychotherapy Date: 11/27/2020      Start: 11:19a     Stop: 12:10p     Time Spent: 51 min Location: In-person   Session narrative (presenting needs, interim history, self-report of stressors and symptoms, applications of prior therapy, status changes, and interventions made in session) Feeling overwhelmed, less sleep, more busy, enjoying the kids, hosted grandkids overnight Saturday.  Friday had driven to [place] area, saw [name] cousin [name] and her husband Jeneen Rinks, who recently was badly injured by a falling tree.  Quality visit, with truly loved ones, and a beautiful drive but drained.  Knows she had some anxiety on the way, not knowing what to expect, 1st time visiting them, and 1st since his injuries.  Aware that she was apprehensive about seeing morbidity.  Lesser concern now about [name]'s driving, and she is being more patient with little triggers she experiences.   Sleep issues seems to be more about PTSD, with initial resistance to sleep and 4am waking, finds her self squirming, to some extent with pressure to remember bad times.  With [name]'s condition, she is recalling the passing of her mother, maternal grandparents, and eldest sister Meg (hx Hodgkin's, longterm effects of massive radiation, 2nd career as a Transport planner, and late in life suicided over perceived losing her daughter and grandchildren to individuation after 20 years of enmeshment.  Meg eventually suicided, actually, by IM injection of blood thinner, though possible indications she thought better of it too late.  The St[place]ant family was in [place] for [place] wedding when they got the news, flew in for her demise in the hospital in [place], had a little private time.  Gaby was present while nurses disconnected her, witnessing her death.  Insight in session that it was this grief  and helpless experience that precipitated her going to IOP and beginning a psychiatric odyssey that involved heavy medication and lost time.  Support/empathy provided.  Affirmed and encouraged in sharing and allowing the story to be more speakable, pointing out that being free to think and talk about it by day tends to mean it will be less intrusive by night.  Therapeutic modalities: Cognitive Behavioral Therapy, Solution-Oriented/Positive Psychology, Ego-Supportive, and Insight-Oriented  Mental Status/Observations:  Appearance:   Casual and Neat     Behavior:  Appropriate  Motor:  Normal  Speech/Language:   Clear and Coherent  Affect:  Appropriate  Mood:  anxious and dysthymic  Thought process:  normal  Thought content:    WNL  Sensory/Perceptual disturbances:    WNL  Orientation:  Fully oriented  Attention:  Good    Concentration:  Fair  Memory:  WNL With episodes of suppression, clearer  Insight:    Fair  Judgment:   Good  Impulse Control:  Good   Risk Assessment: Danger to Self: No, but chronic SI on watch Self-injurious Behavior: No Danger to Others: No Physical Aggression / Violence: No Duty to Warn: No Access to Firearms a concern: No  Assessment of progress:  progressing  Diagnosis:   ICD-10-CM   1. Bipolar I disorder, most recent episode depressed, moderate ([place])  F31.32     2. 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 Anticipatory grief  F43.20     4. 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 History of metabolic encephalopathy  R37.36     6. History of small, chronic left frontal infarct, as assessed by neurologist  [phone]      Plan:  Re memory suppression -- practice allowing grief history to come to mind by day and acknowledge (internally, in writing, or to a trusted loved one), trust nights to be less pressured Re. passenger anxiety -- practice  self-soothing and trust of [name]'s driving when called for, OK to verbalize and ask him to temper it when called for Re. suicide safety -- continue pledge to notify, seek support Re. couplehood -- continue catching each other trying to improve as asked, esp. Jennae giving benefit of the doubt and on [facility] intentions, [name] growing in asking her preferences before blindly doing for her Re. anticipatory grief with [name] -- consider video calls if feasible and interested, any things otherwise left unsaid between them Other recommendations/advice as may be noted above Continue to utilize previously learned skills ad lib Maintain medication as prescribed and work faithfully with relevant prescriber(s) if any changes are desired or seem indicated Call the clinic on-call service, 988/hotline, present to ER, or call 911 if any life-threatening psychiatric crisis Return in about 1 week (around 12/04/2020). Already scheduled visit in this office 12/04/2020.  [name], PhD [name], PhD LP Clinical Psychologist, [facility] [address] [place], [place] [address] [phone]

## 2020-11-27 NOTE — Telephone Encounter (Signed)
Please review

## 2020-11-27 NOTE — Telephone Encounter (Signed)
Rx sent to pharmacy   

## 2020-11-27 NOTE — Telephone Encounter (Signed)
Pended.

## 2020-11-29 ENCOUNTER — Encounter: Payer: Self-pay | Admitting: Physician Assistant

## 2020-12-01 ENCOUNTER — Other Ambulatory Visit: Payer: Self-pay

## 2020-12-01 DIAGNOSIS — F4310 Post-traumatic stress disorder, unspecified: Secondary | ICD-10-CM

## 2020-12-01 MED ORDER — PRAZOSIN HCL 2 MG PO CAPS: 2.0000 mg | ORAL_CAPSULE | Freq: Every day | ORAL | 0 refills | Status: DC

## 2020-12-04 ENCOUNTER — Other Ambulatory Visit: Payer: Self-pay

## 2020-12-04 ENCOUNTER — Encounter (HOSPITAL_BASED_OUTPATIENT_CLINIC_OR_DEPARTMENT_OTHER): Payer: Self-pay | Admitting: Physical Therapy

## 2020-12-04 ENCOUNTER — Ambulatory Visit (HOSPITAL_BASED_OUTPATIENT_CLINIC_OR_DEPARTMENT_OTHER): Payer: Medicare Other | Attending: Physician Assistant | Admitting: Physical Therapy

## 2020-12-04 ENCOUNTER — Telehealth: Payer: Self-pay | Admitting: Adult Health

## 2020-12-04 ENCOUNTER — Ambulatory Visit: Payer: Medicare Other | Admitting: Psychiatry

## 2020-12-04 DIAGNOSIS — M6281 Muscle weakness (generalized): Secondary | ICD-10-CM | POA: Diagnosis present

## 2020-12-04 DIAGNOSIS — R2681 Unsteadiness on feet: Secondary | ICD-10-CM | POA: Insufficient documentation

## 2020-12-04 DIAGNOSIS — F319 Bipolar disorder, unspecified: Secondary | ICD-10-CM

## 2020-12-04 DIAGNOSIS — R2689 Other abnormalities of gait and mobility: Secondary | ICD-10-CM | POA: Diagnosis present

## 2020-12-04 MED ORDER — LAMOTRIGINE 200 MG PO TABS
200.0000 mg | ORAL_TABLET | Freq: Every day | ORAL | 3 refills | Status: DC
Start: 2020-12-04 — End: 2020-12-09

## 2020-12-04 NOTE — Addendum Note (Signed)
Addended by: Carney Living on: 12/04/2020 09:22 PM   Modules accepted: Orders

## 2020-12-04 NOTE — Therapy (Addendum)
[place] [facility] [address] [place], [name], [address] Phone: [phone]   Fax:  [phone]  Physical Therapy Treatment/progress  Patient Details  Name: Tiffany [name] MRN: [id number] Date of Birth: 09/30/1950 Referring Provider (PT): Inda Coke, [place] Progress Note Reporting Period 06/28/2022to 12/04/2020  See note below for Objective Data and Assessment of Progress/Goals.      Encounter Date: 12/04/2020   PT End of Session - 12/04/20 2058     Visit Number 1    Number of Visits 12    Date for PT Re-Evaluation 01/15/21    Authorization Type Medicare    PT Start Time 1015    PT Stop Time 1059    PT Time Calculation (min) 44 min    Activity Tolerance Patient tolerated treatment well             Past Medical History:  Diagnosis Date   Bipolar 1 disorder ([place])    Breast cancer ([place])    Chronic diarrhea    loose stools twice a day on average for years   Chronic kidney disease (CKD), stage III (moderate) ([place])    Depression 1987   Hospitalization or health care facility admission within last 6 months 04/2019   for fall/seizures   Malignant neoplasm of overlapping sites of left breast in female, estrogen receptor positive ([place]) 03/14/2016   Dx in 09/2014, s/p bilateral mastectomies and ALND, 0/10 LN. 1.4 cm  Grade I invasive lobular, ER and PR +/Her--, Ki 67 <5% Tried anastrozole for one month, but developed suicidal idea   Osteoarthritis, knee 09/24/2019   Xray 09/2019   Parkinson's disease ([place]) 2012   Psoriatic arthritis ([place])    PTSD (post-traumatic stress disorder)    Secondary hyperparathyroidism ([place])    Tardive dyskinesia     Past Surgical History:  Procedure Laterality Date   [place] Bilateral 09/19/2014   TONSILLECTOMY  1970   URETERAL REIMPLANTION Bilateral 1974    There were no vitals filed for  this visit.   Subjective Assessment - 12/04/20 1027     Subjective The patient feels like she had been doing pretty good until about a week ago, when she fell down some stairs. She hit her right knee. She had pain in the right knee but it has improved. She would like to return to therapy to work on her general mobility. She has been able to go and down her drivewa, but going down hill is tougher.    Limitations Standing;Walking;House hold activities    How long can you stand comfortably? Could be immediate or an hour. For sure after an hour.    How long can you walk comfortably? has to use a device for limited distances    Patient Stated Goals Getting stronger, improving balance, pain free    Currently in Pain? No/denies                [facility] PT Assessment - 12/04/20 0001       Assessment   Medical Diagnosis M25.552 (ICD-10-CM) - Pain in left hip    Referring Provider (PT) Inda Coke, [place]      Strength   Right Hip Flexion 4/5    Right Hip ABduction 4+/5    Right Hip ADduction 4+/5    Left Hip Flexion 4/5    Left Hip Extension 4+/5    Left Hip  ABduction 4+/5      Transfers   Five time sit to stand comments  15      Ambulation/Gait   Ambulation Distance (Feet) 900 Feet      High Level Balance   High Level Balance Comments tandem stance eyes open SB assist; eyes closed CGA  narrow base eyes opened CGA; eyes closed                           [facility] Adult PT Treatment/Exercise - 12/04/20 0001       Lumbar Exercises: Seated   Other Seated Lumbar Exercises LAQ 2x10 red; marching 2x10 red; hip abduction 2x10 red.    Other Seated Lumbar Exercises bilateral er 2x10;                     PT Education - 12/04/20 2058     Education Details reviewed HEP and symptom mangement    Person(s) Educated Patient    Methods Explanation;Demonstration;Tactile cues;Verbal cues    Comprehension Verbalized understanding;Returned demonstration;Verbal cues  required;Tactile cues required              PT Short Term Goals - 12/04/20 2104       PT SHORT TERM GOAL #1   Title Pt will improve tug with rollater below 14 secs to show improvement in ambulation.    Baseline 22 seconds (from previous episode)    Time 3    Period Weeks    Status New    Target Date 01/01/21      PT SHORT TERM GOAL #2   Title Patient will improve strength for bilateral hip flexion to 5/5.    Baseline 4+/5 ( improved from previosu episode    Time 3    Period Weeks    Status New    Target Date 12/25/20      PT SHORT TERM GOAL #3   Title Patient will performed 5x sit to stand below 10  secs to show improvement is functional strenght.    Baseline imrpved from baseline    Time 4    Period Weeks    Status On-going    Target Date 10/04/20               PT Long Term Goals - 12/04/20 2112       PT LONG TERM GOAL #1   Title Pt will be be able to perform six minute walk test and ambulate 700 feet without a device or with a less restricted device such as a cane independently in order to ambualte in the community    Time 8    Period Weeks    Status On-going      PT LONG TERM GOAL #2   Title Pt will be able to ambulate 6 steps decreased fatigue and correct mechanics in order to get in and out of her house safely    Baseline 3 steps    Time 8    Period Weeks    Status On-going      PT LONG TERM GOAL #3   Title Patient will be independent with pool program and general strengthening program in order to continue to progress mobility    Baseline not assessed on 08/17/19    Time 8    Period Weeks    Status On-going                   Plan - 12/04/20  2101     Clinical Impression Statement Patient is a 70 year old female S/P a fall down the steps 2 weeks ago. She has no injuries, but is cncerned that her balance is decreasing agian. Despite no having xome to PT, her 6 min walk, 5x sit to stand, and higher level balance exercises have all improved.  She plans on joing the gym, and would like to work in the pool and in the gym on exercises thatwould improve her balance.    Personal Factors and Comorbidities Fitness;Time since onset of injury/illness/exacerbation;Comorbidity 3+    Comorbidities multiple pain locations, back, hip, knee, Decreased mobility post covid and hospitalization last year,    Examination-Activity Limitations Stand;Locomotion Level;Bend;Dressing;Squat;Stairs    Stability/Clinical Decision Making Evolving/Moderate complexity    Clinical Decision Making Moderate    Rehab Potential Good    PT Frequency 2x / week    PT Duration 6 weeks    PT Treatment/Interventions ADLs/Self Care Home Management;Cryotherapy;Electrical Stimulation;Ultrasound;Traction;Moist Heat;Iontophoresis 4mg /ml Dexamethasone;DME Instruction;Gait training;Stair training;Functional mobility training;Therapeutic activities;Therapeutic exercise;Orthotic Fit/Training;Patient/family education;Neuromuscular re-education;Balance training;Manual techniques;Taping;Dry needling;Passive range of motion;Spinal Manipulations;Joint Manipulations;Aquatic Therapy;Vasopneumatic Device    PT Next Visit Plan begin porgresisve strengthening in the pool. Be aware of mobility in and out of the pool. Work on balance exercises. Genral strengthening of the lower extremitys;    PT Home Exercise Plan Access Code: [facility]  URL: [email or web address]  Date: 09/06/2020  Prepared by: Carolyne Littles    Exercises  Seated Knee Extension with Resistance - 1 x daily - 7 x weekly - 3 sets - 10 reps  Seated March with Resistance - 1 x daily - 7 x weekly - 3 sets - 10 reps  Seated Hip Abduction with Resistance - 1 x daily - 7 x weekly - 3 sets - 10 reps    Consulted and Agree with Plan of Care Patient             Patient will benefit from skilled therapeutic intervention in order to improve the following deficits and impairments:  Abnormal gait, Pain, Increased muscle spasms,  Decreased activity tolerance, Decreased endurance, Decreased range of motion, Decreased strength, Impaired flexibility, Difficulty walking, Decreased balance, Decreased safety awareness  Visit Diagnosis: Other abnormalities of gait and mobility  Unsteadiness on feet  Muscle weakness (generalized)     Problem List Patient Active Problem List   Diagnosis Date Noted   Osteoarthritis, knee 09/24/2019   CKD (chronic kidney disease) stage 4, GFR 15-29 ml/min ([place]) 09/24/2019   Severe recurrent major depression without psychotic features ([place]) 09/09/2019   Bipolar I disorder, most recent episode depressed ([place])    Transaminitis    Essential hypertension    Encephalopathy 04/26/2019   Leukocytosis 04/21/2019   Thrombocytopenia ([place]) 09/04/2018   Vitamin D deficiency 09/04/2018   Psoriatic arthritis ([place]) 09/04/2018   Tubular adenoma of colon 05/28/2017   History of colonic polyps 05/28/2017   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 [place], [name], [address] Phone: [phone]   Fax:  [phone]  Name: Astrid Vides MRN: [id number] Date of Birth: 03/21/1950

## 2020-12-04 NOTE — Telephone Encounter (Signed)
Pt called stating that [facility] [place] several requests for the Lamictal.  She has only one day left.  Pls call in the Lamictal 200mg  to [facility].  Next appt 10/5

## 2020-12-04 NOTE — Telephone Encounter (Signed)
No refill request noted yet but will send Rx as requested

## 2020-12-07 ENCOUNTER — Other Ambulatory Visit: Payer: Self-pay

## 2020-12-07 ENCOUNTER — Ambulatory Visit (HOSPITAL_BASED_OUTPATIENT_CLINIC_OR_DEPARTMENT_OTHER): Payer: Medicare Other | Admitting: Physical Therapy

## 2020-12-07 ENCOUNTER — Encounter (HOSPITAL_BASED_OUTPATIENT_CLINIC_OR_DEPARTMENT_OTHER): Payer: Self-pay | Admitting: Physical Therapy

## 2020-12-07 ENCOUNTER — Encounter (HOSPITAL_BASED_OUTPATIENT_CLINIC_OR_DEPARTMENT_OTHER): Payer: Self-pay

## 2020-12-07 DIAGNOSIS — R2681 Unsteadiness on feet: Secondary | ICD-10-CM

## 2020-12-07 DIAGNOSIS — R2689 Other abnormalities of gait and mobility: Secondary | ICD-10-CM

## 2020-12-07 DIAGNOSIS — M6281 Muscle weakness (generalized): Secondary | ICD-10-CM

## 2020-12-08 NOTE — Therapy (Signed)
[facility] [place], [name], [address] Phone: [phone]   Fax:  [phone]  Physical Therapy Treatment  Patient Details  Name: Tiffany [name] MRN: [id number] Date of Birth: November 27, 1950 Referring Provider (PT): Inda Coke, [place]   Encounter Date: 12/07/2020   PT End of Session - 12/07/20 1425     Visit Number 1    Number of Visits 12    Date for PT Re-Evaluation 01/15/21    Authorization Type Medicare    PT Start Time 1100    PT Stop Time 1125    PT Time Calculation (min) 25 min    Activity Tolerance Patient tolerated treatment well    Behavior During Therapy [facility] for tasks assessed/performed             Past Medical History:  Diagnosis Date   Bipolar 1 disorder ([place])    Breast cancer ([place])    Chronic diarrhea    loose stools twice a day on average for years   Chronic kidney disease (CKD), stage III (moderate) ([place])    Depression 1987   Hospitalization or health care facility admission within last 6 months 04/2019   for fall/seizures   Malignant neoplasm of overlapping sites of left breast in female, estrogen receptor positive ([place]) 03/14/2016   Dx in 09/2014, s/p bilateral mastectomies and ALND, 0/10 LN. 1.4 cm  Grade I invasive lobular, ER and PR +/Her--, Ki 67 <5% Tried anastrozole for one month, but developed suicidal idea   Osteoarthritis, knee 09/24/2019   Xray 09/2019   Parkinson's disease ([place]) 2012   Psoriatic arthritis ([place])    PTSD (post-traumatic stress disorder)    Secondary hyperparathyroidism ([place])    Tardive dyskinesia     Past Surgical History:  Procedure Laterality Date   [place] Bilateral 09/19/2014   TONSILLECTOMY  1970   URETERAL REIMPLANTION Bilateral 1974    There were no vitals filed for this visit.   Subjective Assessment - 12/07/20 1108     Subjective see  clinical impression statement                                          PT Short Term Goals - 12/04/20 2104       PT SHORT TERM GOAL #1   Title Pt will improve tug with rollater below 14 secs to show improvement in ambulation.    Baseline 22 seconds (from previous episode)    Time 3    Period Weeks    Status New    Target Date 01/01/21      PT SHORT TERM GOAL #2   Title Patient will improve strength for bilateral hip flexion to 5/5.    Baseline 4+/5 ( improved from previosu episode    Time 3    Period Weeks    Status New    Target Date 12/25/20      PT SHORT TERM GOAL #3   Title Patient will performed 5x sit to stand below 10  secs to show improvement is functional strenght.    Baseline imrpved from baseline    Time 4    Period Weeks    Status On-going    Target Date 10/04/20  PT Long Term Goals - 12/04/20 2112       PT LONG TERM GOAL #1   Title Pt will be be able to perform six minute walk test and ambulate 700 feet without a device or with a less restricted device such as a cane independently in order to ambualte in the community    Time 8    Period Weeks    Status On-going      PT LONG TERM GOAL #2   Title Pt will be able to ambulate 6 steps decreased fatigue and correct mechanics in order to get in and out of her house safely    Baseline 3 steps    Time 8    Period Weeks    Status On-going      PT LONG TERM GOAL #3   Title Patient will be independent with pool program and general strengthening program in order to continue to progress mobility    Baseline not assessed on 08/17/19    Time 8    Period Weeks    Status On-going                   Plan - 12/07/20 1422     Clinical Impression Statement Patient reports she flet OK when she lfet her house. She got to the clinc and reported feeling like she was short of breath and anxious. She took an ativan. Therapy brought her back and checked her bvitals.  Her HR was 76 B/P was 146/90. Her dystolic was hight but not to the level of going to the ED. After several minutes she decided to walk and see if the dyspnea had improved. It had. She reported she will go home and see how she feels. she was strongly advised if she becomes SOB to go to the hospital to be checked out.    Personal Factors and Comorbidities Fitness;Time since onset of injury/illness/exacerbation;Comorbidity 3+    Comorbidities multiple pain locations, back, hip, knee, Decreased mobility post covid and hospitalization last year,    Examination-Activity Limitations Stand;Locomotion Level;Bend;Dressing;Squat;Stairs    Examination-Participation Restrictions Cleaning;Meal Prep;Yard Work;Community Activity;Shop;Laundry    Stability/Clinical Decision Making Evolving/Moderate complexity    Clinical Decision Making Moderate    Rehab Potential Good    PT Frequency 2x / week    PT Duration 6 weeks             Patient will benefit from skilled therapeutic intervention in order to improve the following deficits and impairments:  Abnormal gait, Pain, Increased muscle spasms, Decreased activity tolerance, Decreased endurance, Decreased range of motion, Decreased strength, Impaired flexibility, Difficulty walking, Decreased balance, Decreased safety awareness  Visit Diagnosis: Other abnormalities of gait and mobility  Unsteadiness on feet  Muscle weakness (generalized)     Problem List Patient Active Problem List   Diagnosis Date Noted   Osteoarthritis, knee 09/24/2019   CKD (chronic kidney disease) stage 4, GFR 15-29 ml/min (HCC) 09/24/2019   Severe recurrent major depression without psychotic features ([place]) 09/09/2019   Bipolar I disorder, most recent episode depressed ([place])    Transaminitis    Essential hypertension    Encephalopathy 04/26/2019   Leukocytosis 04/21/2019   Thrombocytopenia ([place]) 09/04/2018   Vitamin D deficiency 09/04/2018   Psoriatic arthritis ([place])  09/04/2018   Tubular adenoma of colon 05/28/2017   History of colonic polyps 05/28/2017   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 09/12/2016   Malignant neoplasm of overlapping sites of left breast in female, estrogen  receptor positive ([place]) 03/14/2016   Chronic kidney disease, stage III (moderate) 01/19/2016   Tardive dyskinesia 10/18/2015   History of breast cancer left 2016 12/27/2014   Osteopenia determined by x-ray 10/31/2014   Rosacea 05/21/2007   Bipolar affective disorder, mixed ([place]) 08/16/2004   Acquired hypothyroidism 04/03/1996    Carney Living, PT 12/08/2020, 1:34 PM  [facility] [place], [name], [address] Phone: [phone]   Fax:  [phone]  Name: Tiffany [name] MRN: [id number] Date of Birth: February 08, 1951

## 2020-12-09 ENCOUNTER — Other Ambulatory Visit: Payer: Self-pay

## 2020-12-09 DIAGNOSIS — F319 Bipolar disorder, unspecified: Secondary | ICD-10-CM

## 2020-12-09 MED ORDER — LAMOTRIGINE 200 MG PO TABS: 200.0000 mg | ORAL_TABLET | Freq: Every day | ORAL | 0 refills | Status: DC

## 2020-12-11 ENCOUNTER — Encounter (HOSPITAL_BASED_OUTPATIENT_CLINIC_OR_DEPARTMENT_OTHER): Payer: Self-pay | Admitting: Physical Therapy

## 2020-12-11 ENCOUNTER — Other Ambulatory Visit: Payer: Self-pay

## 2020-12-11 ENCOUNTER — Ambulatory Visit (HOSPITAL_BASED_OUTPATIENT_CLINIC_OR_DEPARTMENT_OTHER): Payer: Medicare Other | Attending: Physician Assistant | Admitting: Physical Therapy

## 2020-12-11 DIAGNOSIS — R2689 Other abnormalities of gait and mobility: Secondary | ICD-10-CM

## 2020-12-11 DIAGNOSIS — M6281 Muscle weakness (generalized): Secondary | ICD-10-CM

## 2020-12-11 DIAGNOSIS — R2681 Unsteadiness on feet: Secondary | ICD-10-CM

## 2020-12-12 ENCOUNTER — Encounter (HOSPITAL_BASED_OUTPATIENT_CLINIC_OR_DEPARTMENT_OTHER): Payer: Self-pay | Admitting: Physical Therapy

## 2020-12-12 NOTE — Therapy (Signed)
[facility] [address] [place], [name], [address] Phone: [phone]   Fax:  [phone]  Physical Therapy Treatment  Patient Details  Name: Tiffany [name] MRN: [id number] Date of Birth: 05-Jul-1950 Referring Provider (PT): Inda Coke, [place]   Encounter Date: 12/11/2020   PT End of Session - 12/11/20 1534     Visit Number 2    Number of Visits 12    Date for PT Re-Evaluation 01/15/21    Authorization Type Medicare    PT Start Time 1515    PT Stop Time 1558    PT Time Calculation (min) 43 min    Activity Tolerance Patient tolerated treatment well    Behavior During Therapy [facility]FL for tasks assessed/performed             Past Medical History:  Diagnosis Date   Bipolar 1 disorder ([place])    Breast cancer ([place])    Chronic diarrhea    loose stools twice a day on average for years   Chronic kidney disease (CKD), stage III (moderate) ([place])    Depression 1987   Hospitalization or health care facility admission within last 6 months 04/2019   for fall/seizures   Malignant neoplasm of overlapping sites of left breast in female, estrogen receptor positive ([place]) 03/14/2016   Dx in 09/2014, s/p bilateral mastectomies and ALND, 0/10 LN. 1.4 cm  Grade I invasive lobular, ER and PR +/Her--, Ki 67 <5% Tried anastrozole for one month, but developed suicidal idea   Osteoarthritis, knee 09/24/2019   Xray 09/2019   Parkinson's disease ([place]) 2012   Psoriatic arthritis ([place])    PTSD (post-traumatic stress disorder)    Secondary hyperparathyroidism ([place])    Tardive dyskinesia     Past Surgical History:  Procedure Laterality Date   [place] Bilateral 09/19/2014   TONSILLECTOMY  1970   URETERAL REIMPLANTION Bilateral 1974    There were no vitals filed for this visit.   Subjective Assessment - 12/11/20 1524     Subjective Patient  reports she felt much better after going home the last time. She had no SOB or anxiety the next day. She is doing well today.    Limitations Standing;Walking;House hold activities    How long can you sit comfortably? no issues    How long can you stand comfortably? Could be immediate or an hour. For sure after an hour.    How long can you walk comfortably? has to use a device for limited distances    Patient Stated Goals Getting stronger, improving balance, pain free    Currently in Pain? No/denies                               [facility] Adult PT Treatment/Exercise - 12/12/20 0001       High Level Balance   High Level Balance Comments rhomberg balance 3 sets by 20 secs, 3 sets by 20 secs eyes closed. tandem gait with eyes open 3 sets by 20 secs. marching in place with emphasis on gait. Forward and backwards rocking x20; marcing without UE support. step over hurdles 4 laps 3 hurdles with CGA      Lumbar Exercises: Seated   Other Seated Lumbar Exercises bilateral er 2x10 red; shoulder flexion red 2x10; horizontal abduction 2x10 red  PT Education - 12/11/20 1534     Education Details How to train balance    Person(s) Educated Patient    Methods Explanation;Demonstration;Verbal cues;Tactile cues    Comprehension Verbalized understanding;Returned demonstration;Verbal cues required;Tactile cues required              PT Short Term Goals - 12/04/20 2104       PT SHORT TERM GOAL #1   Title Pt will improve tug with rollater below 14 secs to show improvement in ambulation.    Baseline 22 seconds (from previous episode)    Time 3    Period Weeks    Status New    Target Date 01/01/21      PT SHORT TERM GOAL #2   Title Patient will improve strength for bilateral hip flexion to 5/5.    Baseline 4+/5 ( improved from previosu episode    Time 3    Period Weeks    Status New    Target Date 12/25/20      PT SHORT TERM GOAL #3   Title  Patient will performed 5x sit to stand below 10  secs to show improvement is functional strenght.    Baseline imrpved from baseline    Time 4    Period Weeks    Status On-going    Target Date 10/04/20               PT Long Term Goals - 12/04/20 2112       PT LONG TERM GOAL #1   Title Pt will be be able to perform six minute walk test and ambulate 700 feet without a device or with a less restricted device such as a cane independently in order to ambualte in the community    Time 8    Period Weeks    Status On-going      PT LONG TERM GOAL #2   Title Pt will be able to ambulate 6 steps decreased fatigue and correct mechanics in order to get in and out of her house safely    Baseline 3 steps    Time 8    Period Weeks    Status On-going      PT LONG TERM GOAL #3   Title Patient will be independent with pool program and general strengthening program in order to continue to progress mobility    Baseline not assessed on 08/17/19    Time 8    Period Weeks    Status On-going                   Plan - 12/11/20 1551     Clinical Impression Statement Therapy worked on balance with the patient today. She did very well. She required increased assitance with eyes closed but still required CGA. She was able to perfrom hurdles and box stepping as well. She was given UE exercises to work on at home. She was given an updated HEP. Therapy will continue to progress as tolerated.    Personal Factors and Comorbidities Fitness;Time since onset of injury/illness/exacerbation;Comorbidity 3+    Comorbidities multiple pain locations, back, hip, knee, Decreased mobility post covid and hospitalization last year,    Examination-Activity Limitations Stand;Locomotion Level;Bend;Dressing;Squat;Stairs    Examination-Participation Restrictions Cleaning;Meal Prep;Yard Work;Community Activity;Shop;Laundry    Stability/Clinical Decision Making Evolving/Moderate complexity    Clinical Decision Making  Moderate    Rehab Potential Good    PT Frequency 2x / week    PT Duration 6 weeks  PT Treatment/Interventions ADLs/Self Care Home Management;Cryotherapy;Electrical Stimulation;Ultrasound;Traction;Moist Heat;Iontophoresis 4mg /ml Dexamethasone;DME Instruction;Gait training;Stair training;Functional mobility training;Therapeutic activities;Therapeutic exercise;Orthotic Fit/Training;Patient/family education;Neuromuscular re-education;Balance training;Manual techniques;Taping;Dry needling;Passive range of motion;Spinal Manipulations;Joint Manipulations;Aquatic Therapy;Vasopneumatic Device    PT Next Visit Plan begin porgresisve strengthening in the pool. Be aware of mobility in and out of the pool. Work on balance exercises. Genral strengthening of the lower extremitys;    PT Home Exercise Plan Access Code: [facility]  URL: [email or web address]  Date: 09/06/2020  Prepared by: Carolyne Littles    Exercises  Seated Knee Extension with Resistance - 1 x daily - 7 x weekly - 3 sets - 10 reps  Seated March with Resistance - 1 x daily - 7 x weekly - 3 sets - 10 reps  Seated Hip Abduction with Resistance - 1 x daily - 7 x weekly - 3 sets - 10 reps    Consulted and Agree with Plan of Care Patient             Patient will benefit from skilled therapeutic intervention in order to improve the following deficits and impairments:  Abnormal gait, Pain, Increased muscle spasms, Decreased activity tolerance, Decreased endurance, Decreased range of motion, Decreased strength, Impaired flexibility, Difficulty walking, Decreased balance, Decreased safety awareness  Visit Diagnosis: Other abnormalities of gait and mobility  Unsteadiness on feet  Muscle weakness (generalized)     Problem List Patient Active Problem List   Diagnosis Date Noted   Osteoarthritis, knee 09/24/2019   CKD (chronic kidney disease) stage 4, GFR 15-29 ml/min ([place]) 09/24/2019   Severe recurrent major depression without  psychotic features ([place]) 09/09/2019   Bipolar I disorder, most recent episode depressed ([place])    Transaminitis    Essential hypertension    Encephalopathy 04/26/2019   Leukocytosis 04/21/2019   Thrombocytopenia ([place]) 09/04/2018   Vitamin D deficiency 09/04/2018   Psoriatic arthritis ([place]) 09/04/2018   Tubular adenoma of colon 05/28/2017   History of colonic polyps 05/28/2017   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 09/12/2016   Malignant neoplasm of overlapping sites of left breast in female, estrogen receptor positive ([place]) 03/14/2016   Chronic kidney disease, stage III (moderate) 01/19/2016   Tardive dyskinesia 10/18/2015   History of breast cancer left 2016 12/27/2014   Osteopenia determined by x-ray 10/31/2014   Rosacea 05/21/2007   Bipolar affective disorder, mixed ([place]) 08/16/2004   Acquired hypothyroidism 04/03/1996    Carney Living, PT DPT  12/12/2020, 10:08 AM  [facility] [address] [place], [name], [address] Phone: [phone]   Fax:  [phone]  Name: Tiffany [name] MRN: [id number] Date of Birth: 09/16/1950

## 2020-12-13 ENCOUNTER — Ambulatory Visit (INDEPENDENT_AMBULATORY_CARE_PROVIDER_SITE_OTHER): Payer: Medicare Other | Admitting: Adult Health

## 2020-12-13 ENCOUNTER — Encounter: Payer: Self-pay | Admitting: Adult Health

## 2020-12-13 ENCOUNTER — Other Ambulatory Visit: Payer: Self-pay

## 2020-12-13 DIAGNOSIS — F4310 Post-traumatic stress disorder, unspecified: Secondary | ICD-10-CM | POA: Diagnosis not present

## 2020-12-13 DIAGNOSIS — F411 Generalized anxiety disorder: Secondary | ICD-10-CM | POA: Diagnosis not present

## 2020-12-13 DIAGNOSIS — F3132 Bipolar disorder, current episode depressed, moderate: Secondary | ICD-10-CM | POA: Diagnosis not present

## 2020-12-13 DIAGNOSIS — G4700 Insomnia, unspecified: Secondary | ICD-10-CM | POA: Diagnosis not present

## 2020-12-13 NOTE — Progress Notes (Signed)
Tiffany [name] [id number] [birth year] 70 y.o.  Subjective:   Patient ID:  Tiffany [name] is a 70 y.o. (DOB 1950/10/27) female.  Chief Complaint: No chief complaint on file.   HPI Tiffany [name] presents to the office today for follow-up of PTSD, insomnia, GAD, BPD 1.  Describes mood today as "ok". Pleasant. Denies tearfulness. Mood symptoms - reports depression - "feels sad about her sister". Feels anxious at times. Decreased irritability over the past few weeks. Reports some racing thoughts - "mania here and there". Plans to visit sister in [place] - now on hospice care - "I'm sad about that".  Feels like medications are helpful - would like to increase Prazosin when she returns. Seeing Dr. Rica Mote weekly. Varying interest and motivation. Taking medications as prescribed.  Energy levels vary with mood. Active, does not have a regular exercise routine - working physical therapy.  Enjoys some usual interests and activities. Married. Lives with husband their daughter. Talking to family and friends.  Appetite adequate. Weight stable.  Sleeping better some nights than others. Averages 9 hours of broken sleep.  Focus and concentration "less than usual". Completing tasks. Managing some aspects of household. Retired.  Denies SI - "it remains at a distance". Denies HI.  Denies AH or VH.   Previous medications: Celexa, Zyprexa, Tegretol, Depakote, Serzone, Topamax, Seroquel, Effexor, Lexapro, Desipramine, Neurontin, Abilify, Geodon, Propanolol, Cymbalta, Cogentin, Trihexyphenadyl, Sinmmet, Provigil, Selegiline, Requip, Amantadine, Prozac, Mirapex, Azilect, Metoclopramide, Baclofen, Artane, Namenda, Latuda.    GAD-7    Flowsheet Row Office Visit from 05/14/2019 in [facility]  Total GAD-7 Score 21      [place] Office Visit from 06/07/2019 in [facility]  Total Score (max 30 points ) 29      PHQ2-9    Flowsheet Row Counselor  from 07/26/2020 in [facility] Visit from 06/05/2020 in [facility] from 03/16/2020 in [facility] Visit from 09/16/2019 in [facility] Visit from 08/24/2019 in [facility]  PHQ-2 Total Score _0 0 0  PHQ-9 Total Score -- 11 3 -- 4      Flowsheet Row ED from 09/08/2019 in [facility] ED from 09/04/2019 in [facility]  C-SSRS RISK CATEGORY High Risk High Risk        Review of Systems:  Review of Systems  Musculoskeletal:  Negative for gait problem.  Neurological:  Negative for tremors.  Psychiatric/Behavioral:         Please refer to HPI   Medications: I have reviewed the patient's current medications.  Current Outpatient Medications  Medication Sig Dispense Refill   calcitRIOL (ROCALTROL) 0.25 MCG capsule Take 1 capsule (0.25 mcg total) by mouth every Monday, Wednesday, and Friday. 12 capsule 0   Certolizumab Pegol (CIMZIA) 2 X 200 MG KIT      cholecalciferol (VITAMIN D3) 25 MCG (1000 UNIT) tablet Take 1,000 Units by mouth daily.     diphenhydrAMINE (BENADRYL) 50 MG tablet Take 50 mg by mouth at bedtime. Help with sleep     lamoTRIgine (LAMICTAL) 200 MG tablet Take 1 tablet (200 mg total) by mouth at bedtime. 90 tablet 0   levothyroxine (SYNTHROID) 112 MCG tablet TAKE 1 TABLET(112 MCG) BY MOUTH DAILY AT 6 AM 30 tablet 0   lithium carbonate 300 MG capsule TAKE 1 CAPSULE(300 MG) BY MOUTH AT BEDTIME 90 capsule 3  LORazepam (ATIVAN) 1 MG tablet TAKE 1 TABLET(1 MG) BY MOUTH TWICE DAILY AS NEEDED 60 tablet 2   metroNIDAZOLE (METROGEL) 0.75 % gel Apply to face 1-2 times daily 45 g 0   prazosin (MINIPRESS) 2 MG capsule Take 1 capsule (2 mg total) by mouth at bedtime. 90 capsule 0   No current facility-administered medications for this visit.    Medication Side Effects:  None  Allergies:  Allergies  Allergen Reactions   Aripiprazole Other (See Comments)    Parkinsonism      Lactose Intolerance (Gi) Diarrhea   Methotrexate Other (See Comments)    Hair loss, severe stomatitis     Cefdinir Diarrhea    Other reaction(s): Diarrhea Yeast infection and fever; negative c diff   Etanercept Other (See Comments)    Headaches     Exemestane Other (See Comments)    Suicidal thoughts with medication     Fluoxetine Other (See Comments)    Parkinsonism   Lactose     Other reaction(s): diarrhea   Methylprednisolone Sodium Succ Other (See Comments)    Agitated mania   Epinephrine Palpitations    tachycardia    Nitrofurantoin Nausea And Vomiting and Rash      Other reaction(s): rash, diarrhea Other reaction(s): rash, diarrhea    Past Medical History:  Diagnosis Date   Bipolar 1 disorder (HCC)    Breast cancer (HCC)    Chronic diarrhea    loose stools twice a day on average for years   Chronic kidney disease (CKD), stage III (moderate) ([place])    Depression 1987   Hospitalization or health care facility admission within last 6 months 04/2019   for fall/seizures   Malignant neoplasm of overlapping sites of left breast in female, estrogen receptor positive ([place]) 03/14/2016   Dx in 09/2014, s/p bilateral mastectomies and ALND, 0/10 LN. 1.4 cm  Grade I invasive lobular, ER and PR +/Her--, Ki 67 <5% Tried anastrozole for one month, but developed suicidal idea   Osteoarthritis, knee 09/24/2019   Xray 09/2019   Parkinson's disease ([place]) 2012   Psoriatic arthritis ([place])    PTSD (post-traumatic stress disorder)    Secondary hyperparathyroidism ([place])    Tardive dyskinesia     Past Medical History, Surgical history, Social history, and Family history were reviewed and updated as appropriate.   Please see review of systems for further details on the patient's review from today.   Objective:   Physical Exam:  There were no vitals taken for this  visit.  Physical Exam Constitutional:      General: She is not in acute distress. Musculoskeletal:        General: No deformity.  Neurological:     Mental Status: She is alert and oriented to person, place, and time.     Coordination: Coordination normal.  Psychiatric:        Attention and Perception: Attention and perception normal. She does not perceive auditory or visual hallucinations.        Mood and Affect: Mood normal. Mood is not anxious or depressed. Affect is not labile, blunt, angry or inappropriate.        Speech: Speech normal.        Behavior: Behavior normal.        Thought Content: Thought content normal. Thought content is not paranoid or delusional. Thought content does not include homicidal or suicidal ideation. Thought content does not include homicidal or suicidal plan.        Cognition and  Memory: Cognition and memory normal.        Judgment: Judgment normal.     Comments: Insight intact    Lab Review:     Component Value Date/Time   NA 140 09/19/2020 1000   NA 138 06/08/2019 0000   K 4.5 09/19/2020 1000   CL 108 09/19/2020 1000   CO2 25 09/19/2020 1000   GLUCOSE 83 09/19/2020 1000   BUN 28 (H) 09/19/2020 1000   BUN 29 (A) 06/08/2019 0000   CREATININE 2.35 (H) 09/19/2020 1000   CALCIUM 9.5 09/19/2020 1000   PROT 7.0 12/03/2019 1105   ALBUMIN 4.0 09/10/2019 0851   AST 16 04/10/2020 0000   ALT 13 04/10/2020 0000   ALKPHOS 145 (A) 04/10/2020 0000   BILITOT 1.0 12/03/2019 1105   GFRNONAA 22 04/10/2020 0000   GFRAA 25 (L) 09/13/2019 1243       Component Value Date/Time   WBC 12.0 04/10/2020 0000   WBC 13.0 (H) 09/08/2019 1347   RBC 4.95 09/08/2019 1347   HGB 14.4 09/08/2019 1347   HCT 46.0 09/08/2019 1347   PLT 220 09/08/2019 1347   MCV 92.9 09/08/2019 1347   MCH 29.1 09/08/2019 1347   MCHC 31.3 09/08/2019 1347   RDW 13.1 09/08/2019 1347   LYMPHSABS 2.5 04/27/2019 0544   MONOABS 0.5 04/27/2019 0544   EOSABS 0.2 04/27/2019 0544   BASOSABS 0.0  04/27/2019 0544    Lithium Lvl  Date Value Ref Range Status  09/19/2020 0.6 0.6 - 1.2 mmol/L Final     No results found for: PHENYTOIN, PHENOBARB, VALPROATE, CBMZ   .res Assessment: Plan:    Plan:  Therapist - Andy Mitchum   Lorazepam 75m BID for anxiety - make take one tablet extra for severe anxiety symptoms. Taking one at night routinely. Lamictal 2022mat hs/5054mn the morning.  Lithium 300m21mily  Prazosin 2mg 19mbedtime  Labs - lithium level, TSH, CMP - October  RTC 4/6 weeks  Counseled patient regarding potential benefits, risks, and side effects of Lamictal to include potential risk of Stevens-Johnson syndrome. Advised patient to stop taking Lamictal and contact office immediately if rash develops and to seek urgent medical attention if rash is severe and/or spreading quickly.  Discussed potential benefits, risk, and side effects of benzodiazepines to include potential risk of tolerance and dependence, as well as possible drowsiness.  Advised patient not to drive if experiencing drowsiness and to take lowest possible effective dose to minimize risk of dependence and tolerance.   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  Future Appointments  Date Time Provider [place] 11:00 AM [facility]  12/18/2020  1:45 PM [facility]  12/20/2020 11:00 AM [facility]  12/25/2020 11:00 AM [facility]  12/26/2020  4:00 PM MitchBlanchie Serve CP-CP None  12/27/2020 11:00 AM [facility]  01/02/2021 11:00 AM MitchBlanchie Serve CP-CP None  01/12/2021  4:00 PM MitchBlanchie Serve CP-CP None   01/19/2021  3:00 PM MitchBlanchie Serve CP-CP None  01/26/2021  4:00 PM MitchBlanchie Serve CP-CP None  02/06/2021  2:00 PM MitchBlanchie Serve CP-CP None  03/22/2021  2:30  PM [facility] HEALTH COACH [facility]    No orders of the defined types were placed in this encounter.   -------------------------------

## 2020-12-14 ENCOUNTER — Ambulatory Visit (HOSPITAL_BASED_OUTPATIENT_CLINIC_OR_DEPARTMENT_OTHER): Payer: Medicare Other | Admitting: Physical Therapy

## 2020-12-18 ENCOUNTER — Encounter (HOSPITAL_BASED_OUTPATIENT_CLINIC_OR_DEPARTMENT_OTHER): Payer: Self-pay | Admitting: Physical Therapy

## 2020-12-18 ENCOUNTER — Ambulatory Visit: Payer: Medicare Other | Admitting: Psychiatry

## 2020-12-20 ENCOUNTER — Ambulatory Visit (INDEPENDENT_AMBULATORY_CARE_PROVIDER_SITE_OTHER): Payer: Medicare Other | Admitting: Psychiatry

## 2020-12-20 ENCOUNTER — Other Ambulatory Visit: Payer: Self-pay

## 2020-12-20 ENCOUNTER — Encounter (HOSPITAL_BASED_OUTPATIENT_CLINIC_OR_DEPARTMENT_OTHER): Payer: Self-pay | Admitting: Physical Therapy

## 2020-12-20 DIAGNOSIS — F4010 Social phobia, unspecified: Secondary | ICD-10-CM

## 2020-12-20 DIAGNOSIS — F4310 Post-traumatic stress disorder, unspecified: Secondary | ICD-10-CM | POA: Diagnosis not present

## 2020-12-20 DIAGNOSIS — F3132 Bipolar disorder, current episode depressed, moderate: Secondary | ICD-10-CM | POA: Diagnosis not present

## 2020-12-20 DIAGNOSIS — F411 Generalized anxiety disorder: Secondary | ICD-10-CM

## 2020-12-20 DIAGNOSIS — F4320 Adjustment disorder, unspecified: Secondary | ICD-10-CM

## 2020-12-20 DIAGNOSIS — Z8669 Personal history of other diseases of the nervous system and sense organs: Secondary | ICD-10-CM
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
## 2020-12-21 ENCOUNTER — Emergency Department (HOSPITAL_BASED_OUTPATIENT_CLINIC_OR_DEPARTMENT_OTHER)
Admission: EM | Admit: 2020-12-21 | Discharge: 2020-12-21 | Disposition: A | Discharge status: Home / Self Care | Payer: Medicare Other | Attending: Emergency Medicine | Admitting: Emergency Medicine

## 2020-12-21 ENCOUNTER — Encounter (HOSPITAL_BASED_OUTPATIENT_CLINIC_OR_DEPARTMENT_OTHER): Payer: Self-pay | Admitting: *Deleted

## 2020-12-21 ENCOUNTER — Other Ambulatory Visit: Payer: Self-pay

## 2020-12-21 ENCOUNTER — Telehealth: Payer: Medicare Other | Admitting: Family Medicine

## 2020-12-21 DIAGNOSIS — E039 Hypothyroidism, unspecified: Secondary | ICD-10-CM | POA: Insufficient documentation

## 2020-12-21 DIAGNOSIS — G20 Parkinson's disease: Secondary | ICD-10-CM | POA: Diagnosis not present

## 2020-12-21 DIAGNOSIS — U071 COVID-19: Secondary | ICD-10-CM | POA: Diagnosis not present

## 2020-12-21 DIAGNOSIS — R110 Nausea: Secondary | ICD-10-CM | POA: Diagnosis not present

## 2020-12-21 DIAGNOSIS — Z79899 Other long term (current) drug therapy: Secondary | ICD-10-CM | POA: Diagnosis not present

## 2020-12-21 DIAGNOSIS — Z853 Personal history of malignant neoplasm of breast: Secondary | ICD-10-CM | POA: Diagnosis not present

## 2020-12-21 DIAGNOSIS — I129 Hypertensive chronic kidney disease with stage 1 through stage 4 chronic kidney disease, or unspecified chronic kidney disease: Secondary | ICD-10-CM | POA: Diagnosis not present

## 2020-12-21 DIAGNOSIS — N184 Chronic kidney disease, stage 4 (severe): Secondary | ICD-10-CM | POA: Insufficient documentation

## 2020-12-21 DIAGNOSIS — R531 Weakness: Secondary | ICD-10-CM | POA: Diagnosis present

## 2020-12-21 LAB — CBC
HCT: 42.3 % (ref 36.0–46.0)
Hemoglobin: 13.4 g/dL (ref 12.0–15.0)
MCH: 29.8 pg (ref 26.0–34.0)
MCHC: 31.7 g/dL (ref 30.0–36.0)
MCV: 94.2 fL (ref 80.0–100.0)
Platelets: 177 10*3/uL (ref 150–400)
RBC: 4.49 MIL/uL (ref 3.87–5.11)
RDW: 13.4 % (ref 11.5–15.5)
WBC: 8.4 10*3/uL (ref 4.0–10.5)
nRBC: 0 % (ref 0.0–0.2)

## 2020-12-21 LAB — BASIC METABOLIC PANEL
Anion gap: 10 (ref 5–15)
BUN: 25 mg/dL — ABNORMAL HIGH (ref 8–23)
CO2: 25 mmol/L (ref 22–32)
Calcium: 9.5 mg/dL (ref 8.9–10.3)
Chloride: 107 mmol/L (ref 98–111)
Creatinine, Ser: 2.3 mg/dL — ABNORMAL HIGH (ref 0.44–1.00)
GFR, Estimated: 22 mL/min — ABNORMAL LOW (ref >60)
Glucose, Bld: 73 mg/dL (ref 70–99)
Potassium: 3.9 mmol/L (ref 3.5–5.1)
Sodium: 142 mmol/L (ref 135–145)

## 2020-12-21 LAB — CBG MONITORING, ED
Glucose-Capillary: 69 mg/dL — ABNORMAL LOW (ref 70–99)
Glucose-Capillary: 84 mg/dL (ref 70–99)

## 2020-12-21 LAB — RESP PANEL BY RT-PCR (FLU A&B, COVID) ARPGX2
Influenza A by PCR: NEGATIVE
Influenza B by PCR: NEGATIVE
SARS Coronavirus 2 by RT PCR: POSITIVE — AB

## 2020-12-21 MED ORDER — MOLNUPIRAVIR EUA 200MG CAPSULE: 4.0000 | ORAL_CAPSULE | Freq: Two times a day (BID) | ORAL | 0 refills | Status: AC

## 2020-12-21 NOTE — ED Notes (Signed)
Pt ambulated in room, NAD, on pulse ox with room air maintaining at 98-99%.

## 2020-12-21 NOTE — ED Notes (Signed)
ED Provider at bedside. 

## 2020-12-21 NOTE — ED Provider Notes (Signed)
[facility] EMERGENCY DEPT Provider Note   CSN: [id number] Arrival date & time: 12/21/20  1455     History Chief Complaint  Patient presents with   Weakness    Tiffany [name] is a 70 y.o. female with a past medical history of bipolar 1, breast cancer, CKD 3, drug-induced parkinsonian, who presents today for evaluation of generally feeling weak.  She states that yesterday she developed slight headache, felt generally weak with loss, sore throat, and subjective fevers.  Today she developed body aches additionally.  She reports nausea without vomiting, no constipation or diarrhea  She reports that she is vaccinated and has had 1 booster against COVID.  She reports compliance with all of her medications including her antihypertensives.  She denies any leg swelling.  No chest pain or shortness of breath.  She denies any wounds or sores.  No urinary symptoms.  No abdominal pain.  She was recently around a granddaughter and states that she was told that the granddaughter had to be picked up from school recently due to concern for viral infection.  She denies any falls or trauma.  She has not had any recent medication changes per her report.   HPI     Past Medical History:  Diagnosis Date   Bipolar 1 disorder ([place])    Breast cancer ([place])    Chronic diarrhea    loose stools twice a day on average for years   Chronic kidney disease (CKD), stage III (moderate) ([place])    Depression 1987   Hospitalization or health care facility admission within last 6 months 04/2019   for fall/seizures   Malignant neoplasm of overlapping sites of left breast in female, estrogen receptor positive ([place]) 03/14/2016   Dx in 09/2014, s/p bilateral mastectomies and ALND, 0/10 LN. 1.4 cm  Grade I invasive lobular, ER and PR +/Her--, Ki 67 <5% Tried anastrozole for one month, but developed suicidal idea   Osteoarthritis, knee 09/24/2019   Xray 09/2019   Parkinson's disease ([place]) 2012   Psoriatic  arthritis ([place])    PTSD (post-traumatic stress disorder)    Secondary hyperparathyroidism ([place])    Tardive dyskinesia     Patient Active Problem List   Diagnosis Date Noted   Osteoarthritis, knee 09/24/2019   CKD (chronic kidney disease) stage 4, GFR 15-29 ml/min ([place]) 09/24/2019   Severe recurrent major depression without psychotic features ([place]) 09/09/2019   Bipolar I disorder, most recent episode depressed ([place])    Transaminitis    Essential hypertension    Encephalopathy 04/26/2019   Leukocytosis 04/21/2019   Thrombocytopenia ([place]) 09/04/2018   Vitamin D deficiency 09/04/2018   Psoriatic arthritis ([place]) 09/04/2018   Tubular adenoma of colon 05/28/2017   History of colonic polyps 05/28/2017   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 09/12/2016   Malignant neoplasm of overlapping sites of left breast in female, estrogen receptor positive ([place]) 03/14/2016   Chronic kidney disease, stage III (moderate) 01/19/2016   Tardive dyskinesia 10/18/2015   History of breast cancer left 2016 12/27/2014   Osteopenia determined by x-ray 10/31/2014   Rosacea 05/21/2007   Bipolar affective disorder, mixed ([place]) 08/16/2004   Acquired hypothyroidism 04/03/1996    Past Surgical History:  Procedure Laterality Date   ABDOMINAL HYSTERECTOMY  [place]   MASTECTOMY Bilateral 09/19/2014   TONSILLECTOMY  1970   URETERAL REIMPLANTION Bilateral 1974     OB History  No obstetric history on file.     Family History  Problem Relation Age of Onset   Cancer Mother    Cancer Sister    Stroke Maternal Grandfather    Diabetes Paternal Grandfather    Cancer Sister     Social History   Tobacco Use   Smoking status: Never   Smokeless tobacco: Never  Vaping Use   Vaping Use: Never used  Substance Use Topics   Alcohol use: Never   Drug use: Never    Home Medications Prior to Admission medications    Medication Sig Start Date End Date Taking? Authorizing Provider  molnupiravir EUA (LAGEVRIO) 200 mg CAPS capsule Take 4 capsules (800 mg total) by mouth 2 (two) times daily for 5 days. 12/21/20 12/26/20 Yes Lorin Glass, PA-C  calcitRIOL (ROCALTROL) 0.25 MCG capsule Take 1 capsule (0.25 mcg total) by mouth every Monday, Wednesday, and Friday. 09/15/19   [name], [name], MD  Certolizumab Pegol [facility]) 2 X 200 MG KIT  02/27/16   [provider]  cholecalciferol (VITAMIN D3) 25 MCG (1000 UNIT) tablet Take 1,000 Units by mouth daily.    [provider]  diphenhydrAMINE (BENADRYL) 50 MG tablet Take 50 mg by mouth at bedtime. Help with sleep    [provider]  lamoTRIgine (LAMICTAL) 200 MG tablet Take 1 tablet (200 mg total) by mouth at bedtime. 12/09/20   Mozingo, Berdie Ogren, NP  levothyroxine (SYNTHROID) 112 MCG tablet TAKE 1 TABLET(112 MCG) BY MOUTH DAILY AT 6 AM 11/27/20   Inda Coke, PA  lithium carbonate 300 MG capsule TAKE 1 CAPSULE(300 MG) BY MOUTH AT BEDTIME 05/09/20   Mozingo, Berdie Ogren, NP  LORazepam (ATIVAN) 1 MG tablet TAKE 1 TABLET(1 MG) BY MOUTH TWICE DAILY AS NEEDED 10/21/20   Mozingo, Berdie Ogren, NP  metroNIDAZOLE (METROGEL) 0.75 % gel Apply to face 1-2 times daily 06/07/20   Inda Coke, PA  prazosin (MINIPRESS) 2 MG capsule Take 1 capsule (2 mg total) by mouth at bedtime. 12/01/20   Mozingo, Berdie Ogren, NP    Allergies    Aripiprazole, Lactose intolerance (gi), Methotrexate, Cefdinir, Etanercept, Exemestane, Fluoxetine, Lactose, Methylprednisolone sodium succ, Epinephrine, and Nitrofurantoin  Review of Systems   Review of Systems  Constitutional:  Positive for chills, fatigue and fever.  HENT:  Positive for congestion, postnasal drip, rhinorrhea and sore throat. Negative for ear pain, sinus pressure, sinus pain and trouble swallowing.   Eyes:  Negative for visual disturbance.  Respiratory:  Negative for cough, chest  tightness, shortness of breath and wheezing.   Cardiovascular:  Negative for chest pain (Occasional, after coughing started) and leg swelling.  Gastrointestinal:  Positive for nausea. Negative for abdominal pain, diarrhea and vomiting.  Genitourinary:  Negative for dysuria and urgency.  Musculoskeletal:  Positive for myalgias (Mild). Negative for back pain and neck pain.  Skin:  Negative for color change and rash.  Neurological:  Positive for weakness (Global, no localized weakness) and headaches. Negative for facial asymmetry and speech difficulty.  Psychiatric/Behavioral:  Negative for confusion.   All other systems reviewed and are negative.  Physical Exam Updated Vital Signs BP (!) 172/78 (BP Location: Right Arm)   Pulse 85   Temp 98.8 F (37.1 C) (Oral)   Resp 18   Ht 5' 4"  (1.626 m)   Wt 99.8 kg   SpO2 99%   BMI 37.76 kg/m   Physical Exam Vitals and nursing note reviewed.  Constitutional:      General: She is  not in acute distress.    Appearance: She is not ill-appearing.  HENT:     Head: Normocephalic and atraumatic.     Mouth/Throat:     Mouth: Mucous membranes are moist.  Eyes:     Conjunctiva/sclera: Conjunctivae normal.  Cardiovascular:     Rate and Rhythm: Normal rate and regular rhythm.     Pulses: Normal pulses.     Heart sounds: Normal heart sounds. No murmur heard. Pulmonary:     Effort: Pulmonary effort is normal. No respiratory distress.     Breath sounds: Normal breath sounds. No stridor.  Abdominal:     General: There is no distension.  Musculoskeletal:     Cervical back: Normal range of motion and neck supple.     Right lower leg: No edema.     Left lower leg: No edema.     Comments: No obvious acute injury  Skin:    General: Skin is warm.  Neurological:     Mental Status: She is alert.     Motor: No weakness.     Comments: Awake and alert, answers all questions appropriately.  Speech is not slurred.  5/5 grip strength bilaterally.  She is  able to lift both of her legs off the floor without difficulty.  Sensation intact to light touch to bilateral arms and legs facial movements are symmetric.  Psychiatric:        Mood and Affect: Mood normal.        Behavior: Behavior normal.    ED Results / Procedures / Treatments   Labs (all labs ordered are listed, but only abnormal results are displayed) Labs Reviewed  RESP PANEL BY RT-PCR (FLU A&B, COVID) ARPGX2 - Abnormal; Notable for the following components:      Result Value   SARS Coronavirus 2 by RT PCR POSITIVE (*)    All other components within normal limits  BASIC METABOLIC PANEL - Abnormal; Notable for the following components:   BUN 25 (*)    Creatinine, Ser 2.30 (*)    GFR, Estimated 22 (*)    All other components within normal limits  CBG MONITORING, ED - Abnormal; Notable for the following components:   Glucose-Capillary 69 (*)    All other components within normal limits  CBC  URINALYSIS, ROUTINE W REFLEX MICROSCOPIC  CBG MONITORING, ED    EKG EKG Interpretation  Date/Time:  Thursday December 21 2020 15:16:37 EDT Ventricular Rate:  86 PR Interval:  160 QRS Duration: 78 QT Interval:  342 QTC Calculation: 409 R Axis:   19 Text Interpretation: Sinus rhythm with occasional Premature ventricular complexes Abnormal QRS-T angle, consider primary T wave abnormality Abnormal ECG Confirmed by Madalyn Rob [phone]) on 12/21/2020 5:42:19 PM  Radiology No results found.  Procedures Procedures   Medications Ordered in ED Medications - No data to display  ED Course  I have reviewed the triage vital signs and the nursing notes.  Pertinent labs & imaging results that were available during my care of the patient were reviewed by me and considered in my medical decision making (see chart for details).  Clinical Course as of 12/21/20 2230  Thu Dec 21, 2020  1810 Per patient's request I spoke with her son, Dr. Myna Hidalgo.  She gave me permission to discuss her results,  ED visit, plan of care, and symptoms with him. [EH]    Clinical Course User Index [EH] Ollen Gross   MDM Rules/Calculators/A&P  Arlie Posch Guymon was evaluated in Emergency Department on 12/21/2020 for the symptoms described in the history of present illness. She was evaluated in the context of the global COVID-19 pandemic, which necessitated consideration that the patient might be at risk for infection with the SARS-CoV-2 virus that causes COVID-19. Institutional protocols and algorithms that pertain to the evaluation of patients at risk for COVID-19 are in a state of rapid change based on information released by regulatory bodies including the CDC and federal and state organizations. These policies and algorithms were followed during the patient's care in the ED.  Patient is a 70 year old woman who presents today for evaluation of feeling generally weak since yesterday with development of subjective fevers, chills, mild body aches, sore throat, nasal congestion.  She was around a grandchild whom she states was possibly sick at school. CBC is unremarkable, BMP shows her creatinine is elevated at 2.3 with a GFR of 22, this appears to be consistent with her baseline.  She was borderline hypoglycemic with a CBG of 69, however she states she felt herself getting low and ate something and on recheck this was improved.  Here she is afebrile, not tachycardic or tachypneic and 99% on room air.  She was able to ambulate with RN without hypoxia or significant difficulty.  She denies any urinary symptoms.  Here her COVID test is positive.  She has vaccinated and boosted, however has risk factors that make her high risk for progression to severe COVID. Given her kidney function unable to use Paxlovid, will use molnupiravir.  RX is given.  She does not have any chest pain or discomfort, cough, shortness of breath, no indication for chest x-ray at this time.  No indication for  hospitalization at this time.   She does have mild headache with ou neurologic deficit, and this appears to be consistent with COVID infection.  She is slightly hypertensive here, she states that the headache is not severe, I suspect that this is more related to feeling poorly and being in the emergency room as she is compliant with her antihypertensives.  She did not have any trauma, doubt intracranial hemorrhage.   Return precautions were discussed with patient who states their understanding.  At the time of discharge patient denied any unaddressed complaints or concerns.  Patient is agreeable for discharge home.  Note: Portions of this report may have been transcribed using voice recognition software. Every effort was made to ensure accuracy; however, inadvertent computerized transcription errors may be present   Final Clinical Impression(s) / ED Diagnoses Final diagnoses:  COVID-19    Rx / DC Orders ED Discharge Orders          Ordered    molnupiravir EUA (LAGEVRIO) 200 mg CAPS capsule  2 times daily        12/21/20 1824             Lorin Glass, PA-C 12/21/20 2231    Lucrezia Starch, MD 12/22/20 [phone]

## 2020-12-21 NOTE — Discharge Instructions (Addendum)
As we discussed today you tested positive for COVID. If you develop significant shortness of breath, chest pain, rashes, leg swelling, confusion, or have any new/concerning or worsening symptoms please seek additional medical care and evaluation. While in the emergency room your blood pressure was elevated.  This is most likely due to the stress of being in the emergency room.  Please follow-up with your primary care doctor.  If your blood pressure continues to be elevated after you recover from [place] you may require changes in your medications.  You have given a prescription today for medication.  This is medication authorized under emergency use for people who have COVID.

## 2020-12-21 NOTE — ED Triage Notes (Signed)
Yesterday afternoon, developed chills, weakness, sore throat, headache, fever. Today feels weak, shaky, chills, body aches. Some nausea.

## 2020-12-25 ENCOUNTER — Telehealth: Payer: Self-pay | Admitting: Adult Health

## 2020-12-25 ENCOUNTER — Other Ambulatory Visit: Payer: Self-pay

## 2020-12-25 ENCOUNTER — Encounter (HOSPITAL_BASED_OUTPATIENT_CLINIC_OR_DEPARTMENT_OTHER): Payer: Self-pay | Admitting: Physical Therapy

## 2020-12-25 DIAGNOSIS — F4310 Post-traumatic stress disorder, unspecified: Secondary | ICD-10-CM

## 2020-12-25 MED ORDER — PRAZOSIN HCL 5 MG PO CAPS: 5.0000 mg | ORAL_CAPSULE | Freq: Every day | ORAL | 0 refills | Status: DC

## 2020-12-25 NOTE — Telephone Encounter (Signed)
Ok to increase?

## 2020-12-25 NOTE — Telephone Encounter (Signed)
Pt called and wants to increase the prazosin to 5 mg. Please send in a refill for of the increased dose to [facility] in [place]

## 2020-12-25 NOTE — Telephone Encounter (Signed)
Script sent  

## 2020-12-25 NOTE — Telephone Encounter (Signed)
Pended.

## 2020-12-25 NOTE — Telephone Encounter (Signed)
Yes, that is fine. 

## 2020-12-26 ENCOUNTER — Ambulatory Visit (INDEPENDENT_AMBULATORY_CARE_PROVIDER_SITE_OTHER): Payer: Medicare Other | Admitting: Psychiatry

## 2020-12-26 DIAGNOSIS — F4310 Post-traumatic stress disorder, unspecified: Secondary | ICD-10-CM

## 2020-12-26 DIAGNOSIS — Z630 Problems in relationship with spouse or partner: Secondary | ICD-10-CM

## 2020-12-26 DIAGNOSIS — F3132 Bipolar disorder, current episode depressed, moderate: Secondary | ICD-10-CM

## 2020-12-26 DIAGNOSIS — Z634 Disappearance and death of family member: Secondary | ICD-10-CM

## 2020-12-26 DIAGNOSIS — Z8669 Personal history of other diseases of the nervous system and sense organs: Secondary | ICD-10-CM

## 2020-12-26 DIAGNOSIS — U071 COVID-19: Secondary | ICD-10-CM

## 2020-12-26 DIAGNOSIS — F4010 Social phobia, unspecified: Secondary | ICD-10-CM

## 2020-12-26 NOTE — Progress Notes (Signed)
Psychotherapy Progress Note [facility] [name], PhD LP  Patient ID: Tiffany [name])    MRN: [id number] Therapy format: Individual psychotherapy Date: 12/26/2020      Start: 4:17p     Stop: 5:06p     Time Spent: 49 min Location: Telehealth visit -- I connected with this patient by an approved telecommunication method (video), with her informed consent, and verifying identity and patient privacy.  I was located at my office and patient at her home.  As needed, we discussed the limitations, risks, and security and privacy concerns associated with telehealth service, including the availability and conditions which currently govern in-person appointments and the possibility that 3rd-party payment may not be fully guaranteed and she may be responsible for charges.  After she indicated understanding, we proceeded with the session.  Also discussed treatment planning, as needed, including ongoing verbal agreement with the plan, the opportunity to ask and answer all questions, her demonstrated understanding of instructions, and her readiness to call the office should symptoms worsen or she feels she is in a crisis state and needs more immediate and tangible assistance.   Session narrative (presenting needs, interim history, self-report of stressors and symptoms, applications of prior therapy, status changes, and interventions made in session) Session converted to video after she developed COVID late last week, and thankfully notified the office.  Was seen at [facility] and confirmed Friday.  Went on 5-day antiviral, today last day.  Had interesting experience being recognized as a [facility] doctor's mother and conspicuously elevated attention.  Fever, fatigue, residual partial loss of taste from previous COVID infection.    Starting 51m prazosin tonight for better control of NMs, etc.  It's been agreeing with her, no ill effects.    Sister passed away yesterday.   Hands sweating while telling of it.  Sister BRaford Pitcherwas there for the end, which included transfer to Hospice home.  News arrived by text 8am, first reaction anger for learning it was 8 hours prior, when [place] been up a lot thinking about her.  Then felt guilty for feeling angry.  While discussing it, discerned that she felt left out once she knew she wasn't informed immediately; able to see possibility that BRaford Pitcherwas being considerate of her, and the rest of the group, not necessarily hogging the role of being in the know.  Still, felt like maybe BRaford Pitchershould have known she would be up and available and informed immediately.    No mention yet of a family gathering, which [name] said she did not want, but we agreed last week is a matter for the living to decide for themselves.  Charming family story of how [name] and MTerrial Rhodesmet [place], and MTerrial Rhodesran out, no common language but love at first sight.  Relating with WDuard Bradymuch better since last week.  Obviously no chances to practice fair notice if he is going to be unavailable, but does trust he recognizes the wish/need since the vivid argument about abandoning her in the hotel.  Affirmed and encouraged.  Therapeutic modalities: Cognitive Behavioral Therapy, Solution-Oriented/Positive Psychology, and Grief Therapy  Mental Status/Observations:  Appearance:   Casual     Behavior:  Appropriate  Motor:  Normal  Speech/Language:   Clear and Coherent  Affect:  Appropriate  Mood:  sad  Thought process:  normal  Thought content:    WNL  Sensory/Perceptual disturbances:    WNL  Orientation:  Fully oriented  Attention:  Good    Concentration:  Good  Memory:  WNL  Insight:    Fair  Judgment:   Good  Impulse Control:  Fair   Risk Assessment: Danger to Self: No Self-injurious Behavior: No Danger to Others: No Physical Aggression / Violence: No Duty to Warn: No Access to Firearms a concern: No  Assessment  of progress:  stabilized  Diagnosis:   ICD-10-CM   1. Bipolar I disorder, most recent episode depressed, moderate ([place])  F31.32     2. Bereavement  Z63.4     3. 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 Social anxiety disorder  F40.10     5. History of metabolic encephalopathy  E03.35     6. Relationship problem between partners  Z63.0     7. COVID-19  U07.1      Plan:  Appropriate self-care for virus Self-affirm normal mix of emotions for the time and practice acknowledging them to self in progress or soon after upset times Word to [facility] as needed that there are many layers to her feelings at times like this, and just empathizing or comforting works better than expressing how he doesn't know why Option to ask directly if she can have a say in celebration of life, or express directly that it felt disrespectful to do that in front of [name] Other recommendations/advice as may be noted above Continue to utilize previously learned skills ad lib Maintain medication as prescribed and work faithfully with relevant prescriber(s) if any changes are desired or seem indicated Call the clinic on-call service, 988/hotline, present to ER, or call 911 if any life-threatening psychiatric crisis Return for session(s) already scheduled. Already scheduled visit in this office 01/02/2021.  [name], PhD [name], PhD LP Clinical Psychologist, [facility] [address], [place], [place] [address] [phone]

## 2020-12-27 ENCOUNTER — Encounter (HOSPITAL_BASED_OUTPATIENT_CLINIC_OR_DEPARTMENT_OTHER): Payer: Self-pay | Admitting: Physical Therapy

## 2020-12-27 ENCOUNTER — Encounter: Payer: Self-pay | Admitting: Physician Assistant

## 2020-12-27 NOTE — Telephone Encounter (Signed)
Patient is scheduled for tomorrow at 9:30am

## 2020-12-27 NOTE — Telephone Encounter (Signed)
Please call pt and schedule a virtual visit per Aldona Bar to discuss her My Chart questions.

## 2020-12-27 NOTE — Telephone Encounter (Signed)
Noted  

## 2020-12-27 NOTE — Telephone Encounter (Signed)
LVM for patient to call back. ?

## 2020-12-28 ENCOUNTER — Encounter: Payer: Self-pay | Admitting: Physician Assistant

## 2020-12-28 ENCOUNTER — Telehealth (INDEPENDENT_AMBULATORY_CARE_PROVIDER_SITE_OTHER): Payer: Medicare Other | Admitting: Physician Assistant

## 2020-12-28 VITALS — BP 111/65

## 2020-12-28 DIAGNOSIS — U071 COVID-19: Secondary | ICD-10-CM | POA: Diagnosis not present

## 2020-12-28 MED ORDER — BENZONATATE 100 MG PO CAPS: 100.0000 mg | ORAL_CAPSULE | Freq: Two times a day (BID) | ORAL | 0 refills | Status: DC | PRN

## 2020-12-28 NOTE — Progress Notes (Signed)
Virtual Visit via Video   I connected with Tiffany [name] on 12/28/20 at  9:30 AM EDT by a video enabled telemedicine application and verified that I am speaking with the correct person using two identifiers. Location patient: Home Location provider: [facility], Office Persons participating in the virtual visit: Rickia, [place] PA-C  I discussed the limitations of evaluation and management by telemedicine and the availability of in person appointments. The patient expressed understanding and agreed to proceed.  Subjective:   HPI:   Patient is requesting evaluation for possible COVID-19.  She presented to the ER on 12/21/2020.  Symptoms started on 12/20/2020.  Initial symptoms were generalized weakness, slight headache and subjective fevers.  Blood pressure was elevated at the emergency room 172/78.  Her blood pressure since returning home has normalized, today was 111/65.  She was prescribed molnupiravir and she took this as prescribed.  This is her second time having COVID.  Denies: Chest pain, shortness of breath, vomiting, lower extremity pain and swelling  She has not returned to physical therapy due to her symptoms and recent illness.  She would like to resume this when she is improved.  She has not had any fevers in the past 3 days.  Patient risk factors: Current LAGTX-64 risk of complications score: 4 Smoking status: Tiffany [name]  reports that she has never smoked. She has never used smokeless tobacco. If female, currently pregnant? []   Yes [x]   No  ROS: See pertinent positives and negatives per HPI.  Patient Active Problem List   Diagnosis Date Noted   Osteoarthritis, knee 09/24/2019   CKD (chronic kidney disease) stage 4, GFR 15-29 ml/min (HCC) 09/24/2019   Severe recurrent major depression without psychotic features (Tiffany [name]) 09/09/2019   Bipolar I disorder, most recent episode depressed (Tiffany [name])    Transaminitis    Essential hypertension     Encephalopathy 04/26/2019   Leukocytosis 04/21/2019   Thrombocytopenia ([name]) 09/04/2018   Vitamin D deficiency 09/04/2018   Psoriatic arthritis ([place]) 09/04/2018   Tubular adenoma of colon 05/28/2017   History of colonic polyps 05/28/2017   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 09/12/2016   Malignant neoplasm of overlapping sites of left breast in female, estrogen receptor positive (Tiffany [name]) 03/14/2016   Chronic kidney disease, stage III (moderate) 01/19/2016   Tardive dyskinesia 10/18/2015   History of breast cancer left 2016 12/27/2014   Osteopenia determined by x-ray 10/31/2014   Rosacea 05/21/2007   Bipolar affective disorder, mixed ([name]) 08/16/2004   Acquired hypothyroidism 04/03/1996    Social History   Tobacco Use   Smoking status: Never   Smokeless tobacco: Never  Substance Use Topics   Alcohol use: Never    Current Outpatient Medications:    benzonatate (TESSALON) 100 MG capsule, Take 1 capsule (100 mg total) by mouth 2 (two) times daily as needed for cough., Disp: 30 capsule, Rfl: 0   amLODipine (NORVASC) 5 MG tablet, Take 5 mg by mouth daily., Disp: , Rfl:    calcitRIOL (ROCALTROL) 0.25 MCG capsule, Take 1 capsule (0.25 mcg total) by mouth every Monday, Wednesday, and Friday., Disp: 12 capsule, Rfl: 0   Certolizumab Pegol (CIMZIA) 2 X 200 MG KIT, , Disp: , Rfl:    Certolizumab Pegol (CIMZIA) 2 X 200 MG KIT, See admin instructions., Disp: , Rfl:    cholecalciferol (VITAMIN D3) 25 MCG (1000 UNIT) tablet, Take 1,000 Units by mouth daily., Disp: , Rfl:    diphenhydrAMINE (BENADRYL) 50 MG  tablet, Take 50 mg by mouth at bedtime. Help with sleep, Disp: , Rfl:    lamoTRIgine (LAMICTAL) 200 MG tablet, Take 1 tablet (200 mg total) by mouth at bedtime., Disp: 90 tablet, Rfl: 0   levothyroxine (SYNTHROID) 112 MCG tablet, TAKE 1 TABLET(112 MCG) BY MOUTH DAILY AT 6 AM, Disp: 30 tablet, Rfl: 0   lithium carbonate 300 MG capsule, TAKE 1 CAPSULE(300 MG) BY MOUTH  AT BEDTIME, Disp: 90 capsule, Rfl: 3   LORazepam (ATIVAN) 1 MG tablet, TAKE 1 TABLET(1 MG) BY MOUTH TWICE DAILY AS NEEDED, Disp: 60 tablet, Rfl: 2   metroNIDAZOLE (METROGEL) 0.75 % gel, Apply to face 1-2 times daily, Disp: 45 g, Rfl: 0   prazosin (MINIPRESS) 5 MG capsule, Take 1 capsule (5 mg total) by mouth at bedtime., Disp: 90 capsule, Rfl: 0   sodium bicarbonate 650 MG tablet, Take 650 mg by mouth daily., Disp: , Rfl:   Allergies  Allergen Reactions   Aripiprazole Other (See Comments)    Parkinsonism      Lactose Intolerance (Gi) Diarrhea   Methotrexate Other (See Comments)    Hair loss, severe stomatitis     Cefdinir Diarrhea    Other reaction(s): Diarrhea Yeast infection and fever; negative c diff   Etanercept Other (See Comments)    Headaches     Exemestane Other (See Comments)    Suicidal thoughts with medication     Fluoxetine Other (See Comments)    Parkinsonism   Lactose     Other reaction(s): diarrhea   Methylprednisolone Sodium Succ Other (See Comments)    Agitated mania   Epinephrine Palpitations    tachycardia    Nitrofurantoin Nausea And Vomiting and Rash      Other reaction(s): rash, diarrhea Other reaction(s): rash, diarrhea    Objective:   VITALS: Per patient if applicable, see vitals. GENERAL: Alert, appears well and in no acute distress. HEENT: Atraumatic, conjunctiva clear, no obvious abnormalities on inspection of external nose and ears. NECK: Normal movements of the head and neck. CARDIOPULMONARY: No increased WOB. Speaking in clear sentences. I:E ratio WNL.  MS: Moves all visible extremities without noticeable abnormality. PSYCH: Pleasant and cooperative, well-groomed. Speech normal rate and rhythm. Affect is appropriate. Insight and judgement are appropriate. Attention is focused, linear, and appropriate.  NEURO: CN grossly intact. Oriented as arrived to appointment on time with no prompting. Moves both UE equally.  SKIN: No obvious  lesions, wounds, erythema, or cyanosis noted on face or hands.  Assessment and Plan:   Diagnoses and all orders for this visit:  [id number]  Other orders -     benzonatate (TESSALON) 100 MG capsule; Take 1 capsule (100 mg total) by mouth 2 (two) times daily as needed for cough.  No red flags on discussion, patient is not in any obvious distress during our visit. Discussed progression of most viral illnesses, and recommended supportive care at this point in time.  I did prescribe her Tessalon Perles to see if this can help with her cough. Discussed over the counter supportive care options, including Tylenol 500 mg q 8 hours, with recommendations to push fluids and rest.  I encouraged her to get her bi-valent booster in about 90 days.  I encourage patient to continue masking protocols until day 10.  Reviewed return precautions including new/worsening fever, SOB, new/worsening cough, sudden onset changes of symptoms. Recommended need to self-quarantine and practice social distancing until symptoms resolve. I recommend that patient follow-up if symptoms worsen or persist despite  treatment x 7-10 days, sooner if needed.  I discussed the assessment and treatment plan with the patient. The patient was provided an opportunity to ask questions and all were answered. The patient agreed with the plan and demonstrated an understanding of the instructions.   The patient was advised to call back or seek an in-person evaluation if the symptoms worsen or if the condition fails to improve as anticipated.   [place], [place] 12/28/2020

## 2021-01-02 ENCOUNTER — Ambulatory Visit (INDEPENDENT_AMBULATORY_CARE_PROVIDER_SITE_OTHER): Payer: Medicare Other | Admitting: Psychiatry

## 2021-01-02 ENCOUNTER — Other Ambulatory Visit: Payer: Self-pay

## 2021-01-02 DIAGNOSIS — F4310 Post-traumatic stress disorder, unspecified: Secondary | ICD-10-CM

## 2021-01-02 DIAGNOSIS — Z8669 Personal history of other diseases of the nervous system and sense organs: Secondary | ICD-10-CM | POA: Diagnosis not present

## 2021-01-02 DIAGNOSIS — Z634 Disappearance and death of family member: Secondary | ICD-10-CM

## 2021-01-02 DIAGNOSIS — F3132 Bipolar disorder, current episode depressed, moderate: Secondary | ICD-10-CM | POA: Diagnosis not present

## 2021-01-02 NOTE — Progress Notes (Signed)
Psychotherapy Progress Note [facility] [name], PhD LP  Patient ID: Tiffany [name])    MRN: [id number] Therapy format: Family therapy w/ patient -- accompanied by Tiffany [name] Date: 01/02/2021      Start: 11:15a     Stop: 12:02p     Time Spent: 47 min Location: In-person   Session narrative (presenting needs, interim history, self-report of stressors and symptoms, applications of prior therapy, status changes, and interventions made in session) Over COVID, H clean of it.  Looking forward to attending [employer] at her church (starts tomorrow), to help process grief for [name].  Misses the daily talks with [name], though now daily with [name], who is handling disposition of her things.  Somewhat eerie to be receiving gifts [name] ordered while living.  Switches to [name], who is now working at [employer], struggling with things that don't seem to be going right, feeling a little shut out by her 3 coteachers, underinformed by the school.  Has decidedly stopped trying to offer unsolicited advice to [name], but naturally concerned.  Worries for her fitting in and finding contentment here after relocating over a year ago.  No further issue with [name] from the trip to [name]'s.    Therapeutic modalities: Cognitive Behavioral Therapy and Solution-Oriented/Positive Psychology  Mental Status/Observations:  Appearance:   Casual and Neat     Behavior:  Appropriate  Motor:  Normal  Speech/Language:   Clear and Coherent  Affect:  Appropriate  Mood:  anxious  Thought process:  normal  Thought content:    WNL  Sensory/Perceptual disturbances:    WNL  Orientation:  Fully oriented  Attention:  Good    Concentration:  Good  Memory:  grossly intact  Insight:    Good  Judgment:   Good  Impulse Control:  Good   Risk Assessment: Danger to Self: No Self-injurious Behavior: No Danger to Others: No Physical Aggression / Violence: No Duty to Warn: No Access to Firearms a  concern: No  Assessment of progress:  stabilized  Diagnosis:   ICD-10-CM   1. Bipolar I disorder, most recent episode depressed, moderate ([place])  F31.32     2. Bereavement  Z63.4     3. 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 History of metabolic encephalopathy  F63.84      Plan:  Self-affirm normal mix of emotions in grief and other adjustments Endorse GriefShare Continued encouragement as needed to [place] that empathizing works better than fixing Similar approach to [employer] continuing prazosin for nightmare and panic control Other recommendations/advice as may be noted above Continue to utilize previously learned skills ad lib Maintain medication as prescribed and work faithfully with relevant prescriber(s) if any changes are desired or seem indicated Call the clinic on-call service, 988/hotline, present to ER, or call 911 if any life-threatening psychiatric crisis Return for session(s) already scheduled. Already scheduled visit in this office 01/12/2021.  [name], PhD [name], PhD LP Clinical Psychologist, [facility] [address], [place], [place] [address] [phone]

## 2021-01-03 ENCOUNTER — Encounter (HOSPITAL_BASED_OUTPATIENT_CLINIC_OR_DEPARTMENT_OTHER): Payer: Self-pay | Admitting: Physical Therapy

## 2021-01-12 ENCOUNTER — Other Ambulatory Visit: Payer: Self-pay

## 2021-01-12 ENCOUNTER — Ambulatory Visit (INDEPENDENT_AMBULATORY_CARE_PROVIDER_SITE_OTHER): Payer: Medicare Other | Admitting: Psychiatry

## 2021-01-12 DIAGNOSIS — F4310 Post-traumatic stress disorder, unspecified: Secondary | ICD-10-CM | POA: Diagnosis not present

## 2021-01-12 DIAGNOSIS — F3132 Bipolar disorder, current episode depressed, moderate: Secondary | ICD-10-CM

## 2021-01-12 DIAGNOSIS — Z634 Disappearance and death of family member: Secondary | ICD-10-CM | POA: Diagnosis not present

## 2021-01-12 DIAGNOSIS — Z638 Other specified problems related to primary support group: Secondary | ICD-10-CM

## 2021-01-12 NOTE — Progress Notes (Signed)
Psychotherapy Progress Note [facility] [name], PhD LP  Patient ID: Tiffany [name])    MRN: [id number] Therapy format: Individual psychotherapy Date: 01/12/2021      Start: 4:22p     Stop: 5:12p     Time Spent: 50 min Location: In-person   Session narrative (presenting needs, interim history, self-report of stressors and symptoms, applications of prior therapy, status changes, and interventions made in session) Although beautiful, weather changes may be affecting her.  Not sleeping well, attributes to developments with sister [name], experienced as condescending, and feeling that she just can't do the proposed burial and gathering in [place].  Discussed possible tactics asserting herself with [name], who tends to teach things unsolicited, has a tendency to patronize, and seems to have taken over planning her memorial.    Maintaining on prazosin.  Therapeutic modalities: Cognitive Behavioral Therapy, Solution-Oriented/Positive Psychology, and Assertiveness/Communication  Mental Status/Observations:  Appearance:   Casual and Neat     Behavior:  Appropriate  Motor:  Normal  Speech/Language:   Clear and Coherent and some halting  Affect:  Appropriate and Constricted  Mood:  dysthymic  Thought process:  normal  Thought content:    worry  Sensory/Perceptual disturbances:    WNL  Orientation:  Fully oriented  Attention:  Good    Concentration:  Good  Memory:  grossly intact  Insight:    Good  Judgment:   Good  Impulse Control:  Good   Risk Assessment: Danger to Self: No Self-injurious Behavior: No Danger to Others: No Physical Aggression / Violence: No Duty to Warn: No Access to Firearms a concern: No  Assessment of progress:  stabilized  Diagnosis: No diagnosis found. Plan:  Assertiveness ideas with Barb Other recommendations/advice as may be noted above Continue to utilize previously learned skills ad lib Maintain medication as prescribed  and work faithfully with relevant prescriber(s) if any changes are desired or seem indicated Call the clinic on-call service, 988/hotline, present to ER, or call 911 if any life-threatening psychiatric crisis No follow-ups on file. Already scheduled visit in this office 01/19/2021.  [name], PhD [name], PhD LP Clinical Psychologist, [facility] [address] [place], [place] [address] [phone]

## 2021-01-15 ENCOUNTER — Ambulatory Visit (HOSPITAL_BASED_OUTPATIENT_CLINIC_OR_DEPARTMENT_OTHER): Payer: Medicare Other | Attending: Physician Assistant | Admitting: Physical Therapy

## 2021-01-15 ENCOUNTER — Other Ambulatory Visit: Payer: Self-pay

## 2021-01-15 ENCOUNTER — Encounter (HOSPITAL_BASED_OUTPATIENT_CLINIC_OR_DEPARTMENT_OTHER): Payer: Self-pay | Admitting: Physical Therapy

## 2021-01-15 DIAGNOSIS — M6281 Muscle weakness (generalized): Secondary | ICD-10-CM | POA: Diagnosis present

## 2021-01-15 DIAGNOSIS — R2689 Other abnormalities of gait and mobility: Secondary | ICD-10-CM | POA: Diagnosis not present

## 2021-01-15 DIAGNOSIS — R2681 Unsteadiness on feet: Secondary | ICD-10-CM | POA: Diagnosis present

## 2021-01-15 DIAGNOSIS — M25552 Pain in left hip: Secondary | ICD-10-CM | POA: Diagnosis present

## 2021-01-15 NOTE — Therapy (Addendum)
[facility] [address] [place], [name], [address] Phone: [phone]   Fax:  [phone]  Physical Therapy Treatment/progress note   Patient Details  Name: Tiffany [name] MRN: [id number] Date of Birth: May 24, 1950 Referring Provider (PT): Inda Coke, [place] Progress Note Reporting Period 12/04/2020 to 01/15/2021  See note below for Objective Data and Assessment of Progress/Goals.      Encounter Date: 01/15/2021   PT End of Session - 01/15/21 1307     Visit Number 3    Number of Visits 12    Date for PT Re-Evaluation 01/15/21    Authorization Type Medicare    PT Start Time 1300    PT Stop Time 8242    PT Time Calculation (min) 43 min    Activity Tolerance Patient tolerated treatment well    Behavior During Therapy WFL for tasks assessed/performed             Past Medical History:  Diagnosis Date   Bipolar 1 disorder ([place])    Breast cancer ([place])    Chronic diarrhea    loose stools twice a day on average for years   Chronic kidney disease (CKD), stage III (moderate) ([place])    Depression 1987   Hospitalization or health care facility admission within last 6 months 04/2019   for fall/seizures   Malignant neoplasm of overlapping sites of left breast in female, estrogen receptor positive ([place]) 03/14/2016   Dx in 09/2014, s/p bilateral mastectomies and ALND, 0/10 LN. 1.4 cm  Grade I invasive lobular, ER and PR +/Her--, Ki 67 <5% Tried anastrozole for one month, but developed suicidal idea   Osteoarthritis, knee 09/24/2019   Xray 09/2019   Parkinson's disease ([place]) 2012   Psoriatic arthritis ([place])    PTSD (post-traumatic stress disorder)    Secondary hyperparathyroidism ([place])    Tardive dyskinesia     Past Surgical History:  Procedure Laterality Date   [place] Bilateral 09/19/2014   TONSILLECTOMY  1970   URETERAL  REIMPLANTION Bilateral 1974    There were no vitals filed for this visit.   Subjective Assessment - 01/15/21 1304     Subjective Patient came down with Covid after the last session and has not been back. She also had an incedent of significant right knee pain. That pain has since resolved. She has had increased pain her left hip. She reports it has been around for 6 months.    Limitations Standing;Walking;House hold activities    How long can you sit comfortably? no issues    How long can you stand comfortably? Could be immediate or an hour. For sure after an hour.    How long can you walk comfortably? has to use a device for limited distances    Patient Stated Goals Getting stronger, improving balance, pain free    Currently in Pain? No/denies   denies pain today               [facility] PT Assessment - 01/15/21 0001       Assessment   Medical Diagnosis M25.552 (ICD-10-CM) - Pain in left hip    Referring Provider (PT) Inda Coke, PA      AROM   Overall AROM Comments -15 deg R knee extension      Strength   Right Hip Flexion 4/5    Right Hip ABduction 4+/5  Right Hip ADduction 4+/5    Left Hip Flexion 4/5    Left Hip Extension 4+/5    Left Hip ABduction 4+/5                           [facility] Adult PT Treatment/Exercise - 01/15/21 0001       High Level Balance   High Level Balance Comments rhomberg balance 3 sets by 20 secs, 3 sets by 20 secs eyes closed. tandem gait with eyes open 3 sets by 20 secs. marching in place with emphasis on gait.  step over hurdles 4 laps 3 hurdles with CGA      Lumbar Exercises: Seated   Other Seated Lumbar Exercises clamshell 3x10 red; seated march 3x10      Knee/Hip Exercises: Supine   Quad Sets Limitations 2x15 5 sec hold    Straight Leg Raises Limitations 3x10                     PT Education - 01/15/21 1307     Education Details reviewed HEP and symptom management    Person(s) Educated Patient     Methods Explanation;Verbal cues;Demonstration;Tactile cues    Comprehension Verbalized understanding;Returned demonstration;Verbal cues required;Tactile cues required              PT Short Term Goals - 01/15/21 1408       PT SHORT TERM GOAL #1   Title Pt will improve tug with rollater below 14 secs to show improvement in ambulation.    Baseline 22 seconds (from previous episode)    Time 3    Period Weeks    Status On-going    Target Date 01/01/21      PT SHORT TERM GOAL #2   Title Patient will improve strength for bilateral hip flexion to 5/5.    Baseline 4+/5 ( improved from previosu episode    Time 3    Period Weeks    Status On-going    Target Date 12/25/20      PT SHORT TERM GOAL #3   Title Patient will performed 5x sit to stand below 10  secs to show improvement is functional strenght.    Baseline 16 sec    Time 4    Period Weeks    Status On-going               PT Long Term Goals - 01/15/21 1409       PT LONG TERM GOAL #1   Title Pt will be be able to perform six minute walk test and ambulate 700 feet without a device or with a less restricted device such as a cane independently in order to ambualte in the community    Time 8    Period Weeks    Status On-going      PT LONG TERM GOAL #2   Title Pt will go up and down 6 steps without increased pain    Baseline 3 steps    Time 6    Period Weeks    Status New      PT LONG TERM GOAL #3   Title Patient will be independent with pool program and general strengthening program in order to continue to progress mobility    Time 6    Period Weeks    Status On-going                   Plan -  01/15/21 1334     Clinical Impression Statement Despite not having come for about a month the patient did not loose much strength. Her knee and hip wern't hurting her today. She had full pain free range of her hip. er knee has a flexion contracture. She was shown some basic supine strengthening for her knee.  Therapy also reviewed some of her seated exercises. She worked on balance exercises but reported mild syncope towards the end. Her B/P was measured at 168/85 which she reports is high for her. She was advised to try to monitor at home. The syncope passed with a seated rest break. Therapy reviewed her HEP. We will continue 1-2W6.    Personal Factors and Comorbidities Fitness;Time since onset of injury/illness/exacerbation;Comorbidity 3+    Comorbidities multiple pain locations, back, hip, knee, Decreased mobility post covid and hospitalization last year,    Examination-Activity Limitations Stand;Locomotion Level;Bend;Dressing;Squat;Stairs    Stability/Clinical Decision Making Evolving/Moderate complexity    Clinical Decision Making Moderate    Rehab Potential Good    PT Frequency 2x / week    PT Duration 6 weeks    PT Treatment/Interventions ADLs/Self Care Home Management;Cryotherapy;Electrical Stimulation;Ultrasound;Traction;Moist Heat;Iontophoresis 4mg /ml Dexamethasone;DME Instruction;Gait training;Stair training;Functional mobility training;Therapeutic activities;Therapeutic exercise;Orthotic Fit/Training;Patient/family education;Neuromuscular re-education;Balance training;Manual techniques;Taping;Dry needling;Passive range of motion;Spinal Manipulations;Joint Manipulations;Aquatic Therapy;Vasopneumatic Device    PT Home Exercise Plan Access Code: [facility]  URL: [email or web address]  Date: 09/06/2020  Prepared by: Carolyne Littles    Exercises  Seated Knee Extension with Resistance - 1 x daily - 7 x weekly - 3 sets - 10 reps  Seated March with Resistance - 1 x daily - 7 x weekly - 3 sets - 10 reps  Seated Hip Abduction with Resistance - 1 x daily - 7 x weekly - 3 sets - 10 reps    Consulted and Agree with Plan of Care Patient             Patient will benefit from skilled therapeutic intervention in order to improve the following deficits and impairments:  Abnormal gait, Pain,  Increased muscle spasms, Decreased activity tolerance, Decreased endurance, Decreased range of motion, Decreased strength, Impaired flexibility, Difficulty walking, Decreased balance, Decreased safety awareness  Visit Diagnosis: Other abnormalities of gait and mobility  Unsteadiness on feet  Muscle weakness (generalized)  Pain in left hip     Problem List Patient Active Problem List   Diagnosis Date Noted   Osteoarthritis, knee 09/24/2019   CKD (chronic kidney disease) stage 4, GFR 15-29 ml/min ([place]) 09/24/2019   Severe recurrent major depression without psychotic features ([place]) 09/09/2019   Bipolar I disorder, most recent episode depressed ([place])    Transaminitis    Essential hypertension    Encephalopathy 04/26/2019   Leukocytosis 04/21/2019   Thrombocytopenia ([place]) 09/04/2018   Vitamin D deficiency 09/04/2018   Psoriatic arthritis ([place]) 09/04/2018   Tubular adenoma of colon 05/28/2017   History of colonic polyps 05/28/2017   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 09/12/2016   Malignant neoplasm of overlapping sites of left breast in female, estrogen receptor positive ([place]) 03/14/2016   Chronic kidney disease, stage III (moderate) 01/19/2016   Tardive dyskinesia 10/18/2015   History of breast cancer left 2016 12/27/2014   Osteopenia determined by x-ray 10/31/2014   Rosacea 05/21/2007   Bipolar affective disorder, mixed ([place]) 08/16/2004   Acquired hypothyroidism 04/03/1996    Carney Living, PT 01/15/2021, 2:13 PM  [facility] [address] [place], [name], [address] Phone: [phone]  Fax:  [phone]  Name: Chanya Chrisley MRN: [id number] Date of Birth: 08-Jun-1950

## 2021-01-19 ENCOUNTER — Other Ambulatory Visit: Payer: Self-pay

## 2021-01-19 ENCOUNTER — Ambulatory Visit (INDEPENDENT_AMBULATORY_CARE_PROVIDER_SITE_OTHER): Payer: Medicare Other | Admitting: Psychiatry

## 2021-01-19 DIAGNOSIS — Z638 Other specified problems related to primary support group: Secondary | ICD-10-CM | POA: Diagnosis not present

## 2021-01-19 DIAGNOSIS — Z8669 Personal history of other diseases of the nervous system and sense organs: Secondary | ICD-10-CM

## 2021-01-19 DIAGNOSIS — F411 Generalized anxiety disorder: Secondary | ICD-10-CM | POA: Diagnosis not present

## 2021-01-19 DIAGNOSIS — Z634 Disappearance and death of family member: Secondary | ICD-10-CM

## 2021-01-19 DIAGNOSIS — F4310 Post-traumatic stress disorder, unspecified: Secondary | ICD-10-CM

## 2021-01-19 DIAGNOSIS — F3132 Bipolar disorder, current episode depressed, moderate: Secondary | ICD-10-CM | POA: Diagnosis not present

## 2021-01-19 NOTE — Progress Notes (Signed)
Psychotherapy Progress Note [facility] [name], PhD LP  Patient ID: Tiffany [name])    MRN: [id number] Therapy format: Individual psychotherapy Date: 01/19/2021      Start: 3:15p     Stop: 3:58p     Time Spent: 43 min Location: In-person   Session narrative (presenting needs, interim history, self-report of stressors and symptoms, applications of prior therapy, status changes, and interventions made in session) Tried communication tips with [name], offering self-awareness that she got off the phone abruptly last time, was warming up to explain, but Barb had no interest, so dropped it.  Feels able to just let it ride, but she has tools if needed to address it if [name] or someone else irritates by speaking wrongly of [name].    [name] has announced she won't be going to [employer].  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  Effectively working out balance of time with grandkids at the house.   [name] continues to bargain with his hearing loss, to her consternation.    Tried out [employer] a couple meetings, decided not to follow through.  Videos and facilitator were good, but put off by hearing too many traumatic losses, including an adult child's suicide, and widows who were voicing one of her worst fears.  Had reaction while telling about the meeting here, felt a bit dissociated.  Oriented to grounding skill, but still feels distressed, notes she has been having more "flashbacks" during the daytime, not necessarily to childhood.  Can be to other unpleasant things, often to regrets she has.  Encouraged in accepting, externalizing, and switching attention to something grounding.  Still shaken, allowed to  make shorter time of it.  Therapeutic modalities: Cognitive Behavioral Therapy and Solution-Oriented/Positive Psychology  Mental Status/Observations:  Appearance:   Casual and Neat     Behavior:  Appropriate  Motor:  Normal  Speech/Language:   Clear and Coherent  Affect:  Appropriate  Mood:  anxious and dysthymic  Thought process:  normal  Thought content:    WNL  Sensory/Perceptual disturbances:    WNL exc pressure to dissociate in session  Orientation:  Fully oriented  Attention:  Good    Concentration:  Fair  Memory:  grossly intact  Insight:    Good  Judgment:   Good  Impulse Control:  Good   Risk Assessment: Danger to Self: No Self-injurious Behavior: No Danger to Others: No Physical Aggression / Violence: No Duty to Warn: No Access to Firearms a concern: No  Assessment of progress:  stabilized  Diagnosis:   ICD-10-CM   1. Generalized anxiety disorder  F41.1     2. Bereavement  Z63.4     3. Bipolar I disorder, most recent episode depressed, moderate ([place])  F31.32     4. Relationship problem with family member  Z63.8     5. 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 History of metabolic encephalopathy  P71.06      Plan:  Self-affirm normal mix of feelings occur in grief.  Option to explore other grief support at interest. Ideas for communicating with [name] about family plans, balancing involvement with need to  manage her own emotional risk Use acknowledgment and grounding skill for any further pressure to dissociate Other recommendations/advice as may be noted above Continue to utilize previously learned skills ad lib Maintain medication as prescribed and work faithfully with relevant prescriber(s) if any changes are desired or seem indicated Call the clinic on-call service, 988/hotline, 911, or present to [facility] or ER if any life-threatening psychiatric crisis Return for put on cancellation list for December. Already  scheduled visit in this office 01/26/2021.  [name], PhD [name], PhD LP Clinical Psychologist, [facility] [address], [place], [place] [address] [phone]

## 2021-01-24 ENCOUNTER — Other Ambulatory Visit: Payer: Self-pay

## 2021-01-24 ENCOUNTER — Encounter: Payer: Self-pay | Admitting: Adult Health

## 2021-01-24 ENCOUNTER — Ambulatory Visit (INDEPENDENT_AMBULATORY_CARE_PROVIDER_SITE_OTHER): Payer: Medicare Other | Admitting: Adult Health

## 2021-01-24 DIAGNOSIS — Z79899 Other long term (current) drug therapy: Secondary | ICD-10-CM | POA: Diagnosis not present

## 2021-01-24 DIAGNOSIS — F4310 Post-traumatic stress disorder, unspecified: Secondary | ICD-10-CM | POA: Diagnosis not present

## 2021-01-24 DIAGNOSIS — F411 Generalized anxiety disorder: Secondary | ICD-10-CM | POA: Diagnosis not present

## 2021-01-24 DIAGNOSIS — F319 Bipolar disorder, unspecified: Secondary | ICD-10-CM | POA: Diagnosis not present

## 2021-01-24 DIAGNOSIS — F3132 Bipolar disorder, current episode depressed, moderate: Secondary | ICD-10-CM

## 2021-01-24 DIAGNOSIS — G4700 Insomnia, unspecified: Secondary | ICD-10-CM

## 2021-01-24 MED ORDER — LITHIUM CARBONATE 300 MG PO CAPS: ORAL_CAPSULE | ORAL | 3 refills | Status: DC

## 2021-01-24 MED ORDER — LAMOTRIGINE 200 MG PO TABS: 200.0000 mg | ORAL_TABLET | Freq: Every day | ORAL | 3 refills | Status: DC

## 2021-01-24 MED ORDER — LORAZEPAM 1 MG PO TABS: ORAL_TABLET | ORAL | 2 refills | Status: DC

## 2021-01-24 MED ORDER — PRAZOSIN HCL 5 MG PO CAPS: 5.0000 mg | ORAL_CAPSULE | Freq: Every day | ORAL | 3 refills | Status: DC

## 2021-01-24 NOTE — Progress Notes (Signed)
Tiffany [name] [id number] Oct 05, 1950 70 y.o.  Subjective:   Patient ID:  Tiffany [name] is a 70 y.o. (DOB May 22, 1950) female.  Chief Complaint: No chief complaint on file.   HPI Tiffany [name] presents to the office today for follow-up of PTSD, insomnia, GAD, BPD 1.  Describes mood today as "ok". Pleasant. Denies tearfulness. Mood symptoms - reports depression - "today I am". Increased anxiety at times. Feels irritable at times. Reports some good days. Decreased racing thoughts. Denies recent mania. Sister recently passed away. Attended grief sessions at church, but did not feel like it had a "good effect" on her. Did not feel like it was helpful for her - "felt very overwhelmed listening to others losses". Seeing Dr. Rica Mote weekly. Varying interest and motivation. Taking medications as prescribed.  Energy levels vary with mood. Active, does not have a regular exercise routine. Going to P/T twice a week. Enjoys some usual interests and activities. Married. Lives with husband their daughter. Talking to family and friends.  Appetite adequate. Weight stable.  Sleeps well most nights. Averages 10 hours of broken sleep. Reports occasional nightmares. Taking Prazosin 46m at hs. Focus and concentration stable. Completing tasks. Managing some aspects of household. Retired.  Denies SI - "no plans or intents". Denies HI.  Denies AH or VH. Reports flashbacks - has a reaction to them.   Previous medications: Celexa, Zyprexa, Tegretol, Depakote, Serzone, Topamax, Seroquel, Effexor, Lexapro, Desipramine, Neurontin, Abilify, Geodon, Propanolol, Cymbalta, Cogentin, Trihexyphenadyl, Sinmmet, Provigil, Selegiline, Requip, Amantadine, Prozac, Mirapex, Azilect, Metoclopramide, Baclofen, Artane, Namenda, Latuda.   GAD-7    Flowsheet Row Office Visit from [place]/14/2019 in [place] Total GAD-7 Score 21      [place] Total Score (max 30 points ) 29      PHQ2-9    Flowsheet Row Counselor [place]2020 [place] PHQ-2 Total Score _0 0 0  PHQ-9 Total Score -- 11 3 -- 4      Flowsheet Row [facility] from [facility]/2019 [facility]/2019 [facility] [place]  C-SSRS RISK CATEGORY No Risk High Risk High Risk        Review of Systems:  Review of Systems  Musculoskeletal:  Negative for gait problem.  Neurological:  Negative for tremors.  Psychiatric/Behavioral:         Please refer to HPI   Medications: I have reviewed the patient's current medications.  Current Outpatient Medications  Medication Sig Dispense Refill   amLODipine (NORVASC) 5 MG tablet Take 5 mg by mouth daily.     benzonatate (TESSALON) 100 MG capsule Take 1 capsule (100 mg total) by mouth 2 (two) times daily as needed for cough. 30 capsule 0   calcitRIOL (ROCALTROL) 0.25 MCG capsule Take 1 capsule (0.25 mcg total) by mouth every Monday, Wednesday, and Friday. 12 capsule 0   Certolizumab Pegol (CIMZIA) 2 X 2537MG KIT      Certolizumab Pegol (CIMZIA) 2 X 200 MG KIT See admin instructions.     cholecalciferol (VITAMIN D3) 25 MCG (1000 UNIT) tablet Take 1,000 Units by mouth  daily.     diphenhydrAMINE (BENADRYL) 50 MG tablet Take 50 mg by mouth at bedtime. Help with sleep     lamoTRIgine (LAMICTAL) 200 MG tablet Take 1 tablet (200 mg total) by mouth at bedtime. 90 tablet 3   levothyroxine (SYNTHROID) 112 MCG tablet TAKE 1 TABLET(112 MCG) BY MOUTH DAILY AT 6 AM 30 tablet 0   lithium carbonate 300 MG  capsule TAKE 1 CAPSULE(300 MG) BY MOUTH AT BEDTIME 90 capsule 3   LORazepam (ATIVAN) 1 MG tablet TAKE 1 TABLET(1 MG) BY MOUTH TWICE DAILY AS NEEDED 60 tablet 2   metroNIDAZOLE (METROGEL) 0.75 % gel Apply to face 1-2 times daily 45 g 0   prazosin (MINIPRESS) 5 MG capsule Take 1 capsule (5 mg total) by mouth at bedtime. 90 capsule 3   sodium bicarbonate 650 MG tablet Take 650 mg by mouth daily.     No current facility-administered medications for this visit.    Medication Side Effects: None  Allergies:  Allergies  Allergen Reactions   Aripiprazole Other (See Comments)    Parkinsonism      Lactose Intolerance (Gi) Diarrhea   Methotrexate Other (See Comments)    Hair loss, severe stomatitis     Cefdinir Diarrhea    Other reaction(s): Diarrhea Yeast infection and fever; negative c diff   Etanercept Other (See Comments)    Headaches     Exemestane Other (See Comments)    Suicidal thoughts with medication     Fluoxetine Other (See Comments)    Parkinsonism   Lactose     Other reaction(s): diarrhea Other reaction(s): diarrhea   Methylprednisolone Sodium Succ Other (See Comments)    Agitated mania   Epinephrine Palpitations    tachycardia    Nitrofurantoin Nausea And Vomiting and Rash      Other reaction(s): rash, diarrhea Other reaction(s): rash, diarrhea Other reaction(s): rash, diarrhea    Past Medical History:  Diagnosis Date   Bipolar 1 disorder (HCC)    Breast cancer (HCC)    Chronic diarrhea    loose stools twice a day on average for years   Chronic kidney disease (CKD), stage III (moderate) ([place])    Depression 1987   Hospitalization or health care facility admission within last 6 months 04/2019   for fall/seizures   Malignant neoplasm of overlapping sites of left breast in female, estrogen receptor positive ([place]) 03/14/2016   Dx in 09/2014, s/p bilateral mastectomies and ALND, 0/10 LN. 1.4 cm  Grade I invasive lobular, ER and PR +/Her--, Ki 67 <5% Tried  anastrozole for one month, but developed suicidal idea   Osteoarthritis, knee 09/24/2019   Xray 09/2019   Parkinson's disease ([place]) 2012   Psoriatic arthritis ([place])    PTSD (post-traumatic stress disorder)    Secondary hyperparathyroidism ([place])    Tardive dyskinesia     Past Medical History, Surgical history, Social history, and Family history were reviewed and updated as appropriate.   Please see review of systems for further details on the patient's review from today.   Objective:   Physical Exam:  There were no vitals taken for this visit.  Physical Exam Constitutional:      General: She is not in acute distress. Musculoskeletal:        General: No deformity.  Neurological:     Mental Status: She is alert and oriented to person, place, and time.     Coordination: Coordination normal.  Psychiatric:        Attention and Perception:  Attention and perception normal. She does not perceive auditory or visual hallucinations.        Mood and Affect: Mood normal. Mood is not anxious or depressed. Affect is not labile, blunt, angry or inappropriate.        Speech: Speech normal.        Behavior: Behavior normal.        Thought Content: Thought content normal. Thought content is not paranoid or delusional. Thought content does not include homicidal or suicidal ideation. Thought content does not include homicidal or suicidal plan.        Cognition and Memory: Cognition and memory normal.        Judgment: Judgment normal.     Comments: Insight intact    Lab Review:     Component Value Date/Time   NA 142 12/21/2020 1525   NA 138 06/08/2019 0000   K 3.9 12/21/2020 1525   CL 107 12/21/2020 1525   CO2 25 12/21/2020 1525   GLUCOSE 73 12/21/2020 1525   BUN 25 (H) 12/21/2020 1525   BUN 29 (A) 06/08/2019 0000   CREATININE 2.30 (H) 12/21/2020 1525   CREATININE 2.35 (H) 09/19/2020 1000   CALCIUM 9.5 12/21/2020 1525   PROT 7.0 12/03/2019 1105   ALBUMIN 4.0 09/10/2019 0851   AST 16  04/10/2020 0000   ALT 13 04/10/2020 0000   ALKPHOS 145 (A) 04/10/2020 0000   BILITOT 1.0 12/03/2019 1105   GFRNONAA 22 (L) 12/21/2020 1525   GFRAA 25 (L) 09/13/2019 1243       Component Value Date/Time   WBC 8.4 12/21/2020 1525   RBC 4.49 12/21/2020 1525   HGB 13.4 12/21/2020 1525   HCT 42.3 12/21/2020 1525   PLT 177 12/21/2020 1525   MCV 94.2 12/21/2020 1525   MCH 29.8 12/21/2020 1525   MCHC 31.7 12/21/2020 1525   RDW 13.4 12/21/2020 1525   LYMPHSABS 2.5 04/27/2019 0544   MONOABS 0.5 04/27/2019 0544   EOSABS 0.2 04/27/2019 0544   BASOSABS 0.0 04/27/2019 0544    Lithium Lvl  Date Value Ref Range Status  09/19/2020 0.6 0.6 - 1.2 mmol/L Final     No results found for: PHENYTOIN, PHENOBARB, VALPROATE, CBMZ   .res Assessment: Plan:     Plan:  Therapist - Andy Mitchum   Lorazepam 62m BID for anxiety - make take one tablet extra for severe anxiety symptoms. Taking one at night routinely. Lamictal 201mat hs/5063mn the morning.  Lithium 300m68mily  Prazosin 5mg 77mbedtime  Labs - lithium level, TSH, CMP - October  RTC 4/6 weeks  Counseled patient regarding potential benefits, risks, and side effects of Lamictal to include potential risk of Stevens-Johnson syndrome. Advised patient to stop taking Lamictal and contact office immediately if rash develops and to seek urgent medical attention if rash is severe and/or spreading quickly.  Discussed potential benefits, risk, and side effects of benzodiazepines to include potential risk of tolerance and dependence, as well as possible drowsiness.  Advised patient not to drive if experiencing drowsiness and to take lowest possible effective dose to minimize risk of dependence and tolerance.   Diagnoses and all orders for this visit:  Bipolar I disorder (HCC) [place]   lamoTRIgine (LAMICTAL) 200 MG tablet; Take 1 tablet (200 mg total) by mouth at bedtime. -     lithium carbonate 300 MG capsule; TAKE 1 CAPSULE(300 MG) BY MOUTH AT  BEDTIME  PTSD (post-traumatic stress disorder) -     prazosin (MINIPRESS)  5 MG capsule; Take 1 capsule (5 mg total) by mouth at bedtime.  Generalized anxiety disorder -     LORazepam (ATIVAN) 1 MG tablet; TAKE 1 TABLET(1 MG) BY MOUTH TWICE DAILY AS NEEDED    Please see After Visit Summary for patient specific instructions.  Future Appointments  Date Time Provider [place]  01/26/2021  4:00 PM [name], PhD CP-CP None  02/05/2021  8:45 AM Carney Living, PT [facility]  02/06/2021  2:00 PM [name], PhD CP-CP None  02/07/2021  9:30 AM Carney Living, PT [facility]  02/12/2021 10:15 AM Carney Living, PT [facility]  02/14/2021 10:15 AM Carney Living, PT [facility]  02/19/2021 10:15 AM Carney Living, PT [facility]  02/21/2021 10:15 AM Carney Living, PT [facility]  02/26/2021 10:15 AM Carney Living, PT [facility]  02/28/2021 10:15 AM Carney Living, PT [facility]  03/06/2021  1:45 PM Carney Living, PT [facility]  03/08/2021 12:30 PM Carney Living, PT [facility]  03/16/2021 11:00 AM [name], PhD CP-CP None  03/22/2021  2:30 PM [facility] HEALTH COACH [facility]  03/23/2021 10:00 AM [name], PhD CP-CP None  03/30/2021 11:00 AM [name], PhD CP-CP None    No orders of the defined types were placed in this encounter.   -------------------------------

## 2021-01-26 ENCOUNTER — Other Ambulatory Visit: Payer: Self-pay

## 2021-01-26 ENCOUNTER — Ambulatory Visit (INDEPENDENT_AMBULATORY_CARE_PROVIDER_SITE_OTHER): Payer: Medicare Other | Admitting: Psychiatry

## 2021-01-26 DIAGNOSIS — F3132 Bipolar disorder, current episode depressed, moderate: Secondary | ICD-10-CM

## 2021-01-26 DIAGNOSIS — Z634 Disappearance and death of family member: Secondary | ICD-10-CM | POA: Diagnosis not present

## 2021-01-26 DIAGNOSIS — F411 Generalized anxiety disorder: Secondary | ICD-10-CM

## 2021-01-26 DIAGNOSIS — F4310 Post-traumatic stress disorder, unspecified: Secondary | ICD-10-CM

## 2021-01-26 DIAGNOSIS — Z8669 Personal history of other diseases of the nervous system and sense organs: Secondary | ICD-10-CM

## 2021-01-26 DIAGNOSIS — N184 Chronic kidney disease, stage 4 (severe): Secondary | ICD-10-CM

## 2021-01-26 NOTE — Progress Notes (Signed)
Psychotherapy Progress Note [facility] [name], PhD LP  Patient ID: OVEDA [id number])    MRN: [id number] Therapy format: Individual psychotherapy Date: 01/26/2021      Start: 4:10p     Stop: 5:00p     Time Spent: 50 min Location: In-person   Session narrative (presenting needs, interim history, self-report of stressors and symptoms, applications of prior therapy, status changes, and interventions made in session) Mood lower lately.  Med check yesterday probed issue of dissociation.  Had a bout of the blue angry reaction with [facility] yesterday.  Realizes she still fears the prospect of [name]'s death and not being able to manage her affairs, despite having gotten questions answered earlier about how it would work if she lost him.  Validated it all as anxiety-provoking, encouraged her in recognizing anxiety underneath anger as soon as she can and trying to speak for it as help to herself and others trying to respond.  For identified anxiety and worry, framed the general practice of "shrinking the catastrophe" -- imagining not only the worst case but how else things could turn out besides, and what she or others would actually do about it if.  Question about prazosin -- still has NMs and daytime startles, usually associated with memories but also vivid or gory images of harm.  These may be associated with a seasonal increase in her fear of loss, death, aloneness, and alienation still rather fresh off [name]'s passing.  Acknowledges she's still depressed, sad a lot, just not motivated to suicide.  Validated grief taking what it takes, though maybe the intrusive images are really more like "daymares" associated with feeling terribly vulnerable.  Discussed how she has not tried the full dose yet, could probably benefit from increase, .  Got in touch with prescriber by chat, willing to try.  Therapeutic modalities: Cognitive Behavioral Therapy and Solution-Oriented/Positive  Psychology  Mental Status/Observations:  Appearance:   Casual     Behavior:  Appropriate  Motor:  Normal  Speech/Language:   Clear and Coherent  Affect:  Appropriate  Mood:  depressed  Thought process:  concrete  Thought content:    WNL  Sensory/Perceptual disturbances:    WNL  Orientation:  Fully oriented  Attention:  Good    Concentration:  Good  Memory:  WNL  Insight:    Fair  Judgment:   Good  Impulse Control:  Fair   Risk Assessment: Danger to Self: No Self-injurious Behavior: No Danger to Others: No Physical Aggression / Violence: No Duty to Warn: No Access to Firearms a concern: No  Assessment of progress:  situational setback(s)  Diagnosis:   ICD-10-CM   1. PTSD (post-traumatic stress disorder)  F43.10     2. Bipolar I disorder, most recent episode depressed, moderate ([place])  F31.32     3. Generalized anxiety disorder  F41.1     4. Bereavement  Z63.4     5. History of metabolic encephalopathy  J00.93     6. History of small, chronic left frontal infarct, as assessed by neurologist  Z86.69     7. CKD (chronic kidney disease) stage 4, GFR 15-29 ml/min (HCC) by history  N18.4      Plan:  In consultation with psychiatry, try more adequate dose of prazosin Practice acknowledging anxiety underneath angry feelings and try to speak for it For unavoidable worries, practice imagining not just the worst case, but lesser, and what she and others might legitimately do about it Try to acknowledge "  daymares" as waking dreams reflecting intimidating feelings, not indications of breakdown Other recommendations/advice as may be noted above Continue to utilize previously learned skills ad lib Maintain medication as prescribed and work faithfully with relevant prescriber(s) if any changes are desired or seem indicated Call the clinic on-call service, 988/hotline, 911, or present to [facility] or ER if any life-threatening psychiatric crisis Return in about 1 week (around  02/02/2021) for time as available, put on cancellation list. Already scheduled visit in this office 02/06/2021.  [name], PhD [name], PhD LP Clinical Psychologist, [facility] [address] [place], [place] [address] [phone]

## 2021-01-29 ENCOUNTER — Other Ambulatory Visit: Payer: Self-pay

## 2021-01-29 ENCOUNTER — Ambulatory Visit (INDEPENDENT_AMBULATORY_CARE_PROVIDER_SITE_OTHER): Payer: Medicare Other | Admitting: Adult Health

## 2021-01-29 DIAGNOSIS — Z79899 Other long term (current) drug therapy: Secondary | ICD-10-CM

## 2021-01-29 DIAGNOSIS — G4700 Insomnia, unspecified: Secondary | ICD-10-CM

## 2021-01-29 DIAGNOSIS — F319 Bipolar disorder, unspecified: Secondary | ICD-10-CM | POA: Diagnosis not present

## 2021-01-29 DIAGNOSIS — F4310 Post-traumatic stress disorder, unspecified: Secondary | ICD-10-CM | POA: Diagnosis not present

## 2021-01-29 DIAGNOSIS — F411 Generalized anxiety disorder: Secondary | ICD-10-CM

## 2021-01-29 MED ORDER — PRAZOSIN HCL 5 MG PO CAPS: 5.0000 mg | ORAL_CAPSULE | Freq: Every day | ORAL | 3 refills | Status: DC

## 2021-01-29 NOTE — Progress Notes (Signed)
Tiffany [name] [id number] 03/18/1950 70 y.o.  Subjective:   Patient ID:  Tiffany [name] is a 70 y.o. (DOB 1950-12-16) female.  Chief Complaint: No chief complaint on file.    HPI Madalaine Portier Mciver presents to the office today for follow-up of PTSD, insomnia, GAD, BPD 1.  Describes mood today as "not too good". Pleasant. Denies tearfulness. Mood symptoms - reports depression and anxiety. Denies irritability. Stating "I've started seeing things". Reports symptoms started after increasing the Prazosin over the weekend. Reports seeing a tall thin woman dancing around in her room, a bowl of macaroni on a table next to her bed, hearing babbling a lot. Seeing Dr. Rica Mote weekly. Varying interest and motivation. Taking medications as prescribed. Energy levels vary with mood. Active, does not have a regular exercise routine. Going to P/T twice a week. Enjoys some usual interests and activities. Married. Lives with husband their daughter. Talking to family and friends.  Appetite adequate. Weight stable.  Sleeps well most nights. Averages 10 hours of broken sleep. Focus and concentration stable. Completing tasks. Managing some aspects of household. Retired.  Denies SI - "no plans or intents". Denies HI.  Denies AH Reports VH  Reports flashbacks - things over the past 10 years.  Previous medications: Celexa, Zyprexa, Tegretol, Depakote, Serzone, Topamax, Seroquel, Effexor, Lexapro, Desipramine, Neurontin, Abilify, Geodon, Propanolol, Cymbalta, Cogentin, Trihexyphenadyl, Sinmmet, Provigil, Selegiline, Requip, Amantadine, Prozac, Mirapex, Azilect, Metoclopramide, Baclofen, Artane, Namenda, Latuda.    GAD-7    Flowsheet Row Office Visit from 05/14/2019 in [facility]  Total GAD-7 Score 21      [place] Office Visit from 06/07/2019 in [facility]  Total Score (max 30 points ) 29      PHQ2-9    Flowsheet Row Counselor from  07/26/2020 in [facility] Visit from 06/05/2020 in [facility] from 03/16/2020 in [facility] Visit from 09/16/2019 in [facility] Visit from 08/24/2019 in [facility]  PHQ-2 Total Score 1 4 2  0 0  PHQ-9 Total Score -- 11 3 -- 4      Flowsheet Row ED from 12/21/2020 in [facility] ED from 09/08/2019 in [facility] ED from 09/04/2019 in [facility]  C-SSRS RISK CATEGORY No Risk High Risk High Risk        Review of Systems:  Review of Systems  Musculoskeletal:  Negative for gait problem.  Neurological:  Negative for tremors.  Psychiatric/Behavioral:         Please refer to HPI   Medications: I have reviewed the patient's current medications.  Current Outpatient Medications  Medication Sig Dispense Refill   amLODipine (NORVASC) 5 MG tablet Take 5 mg by mouth daily.     benzonatate (TESSALON) 100 MG capsule Take 1 capsule (100 mg total) by mouth 2 (two) times daily as needed for cough. 30 capsule 0   calcitRIOL (ROCALTROL) 0.25 MCG capsule Take 1 capsule (0.25 mcg total) by mouth every Monday, Wednesday, and Friday. 12 capsule 0   Certolizumab Pegol (CIMZIA) 2 X 683 MG KIT      Certolizumab Pegol (CIMZIA) 2 X 200 MG KIT See admin instructions.     cholecalciferol (VITAMIN D3) 25 MCG (1000 UNIT) tablet Take 1,000 Units by mouth daily.     diphenhydrAMINE (BENADRYL) 50 MG tablet Take 50 mg by mouth at bedtime. Help with sleep  lamoTRIgine (LAMICTAL) 200 MG tablet Take 1 tablet (200 mg total) by mouth at bedtime. 90 tablet 3   levothyroxine (SYNTHROID) 112 MCG tablet TAKE 1 TABLET(112 MCG) BY MOUTH DAILY AT 6 AM 30 tablet 0   lithium carbonate 300 MG capsule TAKE 1 CAPSULE(300 MG) BY MOUTH AT BEDTIME 90 capsule 3   LORazepam (ATIVAN) 1 MG tablet TAKE 1  TABLET(1 MG) BY MOUTH TWICE DAILY AS NEEDED 60 tablet 2   metroNIDAZOLE (METROGEL) 0.75 % gel Apply to face 1-2 times daily 45 g 0   prazosin (MINIPRESS) 5 MG capsule Take 1 capsule (5 mg total) by mouth at bedtime. 90 capsule 3   sodium bicarbonate 650 MG tablet Take 650 mg by mouth daily.     No current facility-administered medications for this visit.    Medication Side Effects: None  Allergies:  Allergies  Allergen Reactions   Aripiprazole Other (See Comments)    Parkinsonism      Lactose Intolerance (Gi) Diarrhea   Methotrexate Other (See Comments)    Hair loss, severe stomatitis     Cefdinir Diarrhea    Other reaction(s): Diarrhea Yeast infection and fever; negative c diff   Etanercept Other (See Comments)    Headaches     Exemestane Other (See Comments)    Suicidal thoughts with medication     Fluoxetine Other (See Comments)    Parkinsonism   Lactose     Other reaction(s): diarrhea Other reaction(s): diarrhea   Methylprednisolone Sodium Succ Other (See Comments)    Agitated mania   Epinephrine Palpitations    tachycardia    Nitrofurantoin Nausea And Vomiting and Rash      Other reaction(s): rash, diarrhea Other reaction(s): rash, diarrhea Other reaction(s): rash, diarrhea    Past Medical History:  Diagnosis Date   Bipolar 1 disorder (HCC)    Breast cancer (HCC)    Chronic diarrhea    loose stools twice a day on average for years   Chronic kidney disease (CKD), stage III (moderate) ([place])    Depression 1987   Hospitalization or health care facility admission within last 6 months 04/2019   for fall/seizures   Malignant neoplasm 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 1.4 cm  Grade I invasive lobular, ER and PR +/Her--, Ki 67 <5% Tried anastrozole for one month, but developed suicidal idea   Osteoarthritis, knee 09/24/2019   Xray 09/2019    Parkinson's disease ([place]) 2012   Psoriatic arthritis ([place])    PTSD (post-traumatic stress disorder)    Secondary hyperparathyroidism ([place])    Tardive dyskinesia     Past Medical History, Surgical history, Social history, and Family history were reviewed and updated as appropriate.   Please see review of systems for further details on the patient's review from today.   Objective:   Physical Exam:  There were no vitals taken for this visit.  Physical Exam Constitutional:      General: She is not in acute distress. Musculoskeletal:        General: No deformity.  Neurological:     Mental Status: She is alert and oriented to person, place, and time.     Coordination: Coordination normal.  Psychiatric:        Attention and Perception: Attention and perception normal. She does not perceive auditory or visual hallucinations.        Mood and Affect: Mood normal.  Mood is not anxious or depressed. Affect is not labile, blunt, angry or inappropriate.        Speech: Speech normal.        Behavior: Behavior normal.        Thought Content: Thought content normal. Thought content is not paranoid or delusional. Thought content does not include homicidal or suicidal ideation. Thought content does not include homicidal or suicidal plan.        Cognition and Memory: Cognition and memory normal.        Judgment: Judgment normal.     Comments: Insight intact    Lab Review:     Component Value Date/Time   NA 142 12/21/2020 1525   NA 138 06/08/2019 0000   K 3.9 12/21/2020 1525   CL 107 12/21/2020 1525   CO2 25 12/21/2020 1525   GLUCOSE 73 12/21/2020 1525   BUN 25 (H) 12/21/2020 1525   BUN 29 (A) 06/08/2019 0000   CREATININE 2.30 (H) 12/21/2020 1525   CREATININE 2.35 (H) 09/19/2020 1000   CALCIUM 9.5 12/21/2020 1525   PROT 7.0 12/03/2019 1105   ALBUMIN 4.0 09/10/2019 0851   AST 16 04/10/2020 0000   ALT 13 04/10/2020 0000   ALKPHOS 145 (A) 04/10/2020 0000   BILITOT 1.0 12/03/2019 1105    GFRNONAA 22 (L) 12/21/2020 1525   GFRAA 25 (L) 09/13/2019 1243       Component Value Date/Time   WBC 8.4 12/21/2020 1525   RBC 4.49 12/21/2020 1525   HGB 13.4 12/21/2020 1525   HCT 42.3 12/21/2020 1525   PLT 177 12/21/2020 1525   MCV 94.2 12/21/2020 1525   MCH 29.8 12/21/2020 1525   MCHC 31.7 12/21/2020 1525   RDW 13.4 12/21/2020 1525   LYMPHSABS 2.5 04/27/2019 0544   MONOABS 0.5 04/27/2019 0544   EOSABS 0.2 04/27/2019 0544   BASOSABS 0.0 04/27/2019 0544    Lithium Lvl  Date Value Ref Range Status  09/19/2020 0.6 0.6 - 1.2 mmol/L Final     No results found for: PHENYTOIN, PHENOBARB, VALPROATE, CBMZ   .res Assessment: Plan:    Plan:  Therapist - Andy Mitchum   Lorazepam 18m BID for anxiety - make take one tablet extra for severe anxiety symptoms. Taking one at night routinely. Lamictal 2054mat hs/5022mn the morning.  Lithium 300m6mily  Prazosin 5mg 66mbedtime - decreased from 6mg a11ms - dose increased 3 days ago.  Labs - lithium level, TSH, CMP - October  RTC 4/6 weeks  Counseled patient regarding potential benefits, risks, and side effects of Lamictal to include potential risk of Stevens-Johnson syndrome. Advised patient to stop taking Lamictal and contact office immediately if rash develops and to seek urgent medical attention if rash is severe and/or spreading quickly.  Discussed potential benefits, risk, and side effects of benzodiazepines to include potential risk of tolerance and dependence, as well as possible drowsiness.  Advised patient not to drive if experiencing drowsiness and to take lowest possible effective dose to minimize risk of dependence and tolerance.    Diagnoses and all orders for this visit:  High risk medication use -     TSH -     Basic metabolic panel -     Lithium level  PTSD (post-traumatic stress disorder) -     prazosin (MINIPRESS) 5 MG capsule; Take 1 capsule (5 mg total) by mouth at bedtime.  Bipolar I disorder  (HCC)  Generalized anxiety disorder  Insomnia, unspecified type  Please see After Visit Summary for patient specific instructions.  Future Appointments  Date Time Provider [place]  02/05/2021  8:45 AM Carney Living, PT [facility]  02/06/2021  2:00 PM [name], PhD [facility] None  02/07/2021  9:30 AM Carney Living, PT [facility]  02/12/2021 10:15 AM Carney Living, PT [facility]  02/14/2021 10:15 AM Carney Living, PT [facility]  02/19/2021 10:15 AM Carney Living, PT [facility]  02/21/2021 10:15 AM Carney Living, PT [facility]  02/26/2021 10:15 AM Carney Living, PT [facility]  02/28/2021 10:15 AM Carney Living, PT [facility]  03/06/2021  1:45 PM Carney Living, PT [facility]  03/08/2021 12:30 PM Carney Living, PT [facility]  03/16/2021 11:00 AM [name], PhD [facility] None  03/22/2021  2:30 PM [facility] HEALTH COACH [facility]  03/23/2021 10:00 AM [name], PhD [facility] None  03/28/2021  2:00 PM Ruven Corradi, Berdie Ogren, NP [facility] None  03/30/2021 11:00 AM [name], PhD [facility] None    Orders Placed This Encounter  Procedures   TSH   Basic metabolic panel   Lithium level    -------------------------------

## 2021-01-30 ENCOUNTER — Encounter: Payer: Self-pay | Admitting: Adult Health

## 2021-02-03 ENCOUNTER — Other Ambulatory Visit: Payer: Self-pay

## 2021-02-03 ENCOUNTER — Encounter (HOSPITAL_COMMUNITY): Payer: Self-pay

## 2021-02-03 ENCOUNTER — Ambulatory Visit (HOSPITAL_COMMUNITY)
Admission: EM | Admit: 2021-02-03 | Discharge: 2021-02-03 | Disposition: A | Discharge status: Home / Self Care | Payer: Medicare Other | Attending: Emergency Medicine | Admitting: Emergency Medicine

## 2021-02-03 DIAGNOSIS — J069 Acute upper respiratory infection, unspecified: Secondary | ICD-10-CM

## 2021-02-03 LAB — BASIC METABOLIC PANEL
BUN/Creatinine Ratio: 14 (calc) (ref 6–22)
BUN: 28 mg/dL — ABNORMAL HIGH (ref 7–25)
CO2: 24 mmol/L (ref 20–32)
Calcium: 9.3 mg/dL (ref 8.6–10.4)
Chloride: 109 mmol/L (ref 98–110)
Creat: 2.03 mg/dL — ABNORMAL HIGH (ref 0.60–1.00)
Glucose, Bld: 92 mg/dL (ref 65–99)
Potassium: 4.4 mmol/L (ref 3.5–5.3)
Sodium: 142 mmol/L (ref 135–146)

## 2021-02-03 LAB — POC INFLUENZA A AND B ANTIGEN (URGENT CARE ONLY)
INFLUENZA A ANTIGEN, POC: NEGATIVE
INFLUENZA B ANTIGEN, POC: NEGATIVE

## 2021-02-03 LAB — LITHIUM LEVEL: Lithium Lvl: 0.6 mmol/L (ref 0.6–1.2)

## 2021-02-03 LAB — TSH: TSH: 2.1 mIU/L (ref 0.40–4.50)

## 2021-02-03 NOTE — ED Triage Notes (Signed)
3-4 days of braking cough with greenish sputum, greenish nasal drainage, fatigue and burning in chest with cough. Pt states that she has difficulty sleeping and night due to the cough and shortness of breath. Pt had 1 day of chills but unsure of fever.

## 2021-02-03 NOTE — Discharge Instructions (Addendum)
Rapid influenza testing today was negative.  Despite this, based on the history that you provided for me and my physical exam findings, I believe that you are still suffering from an upper respiratory illness that is viral.   do not see any evidence of bacterial infection therefore treatment with antibiotics would be of no benefit.  Please remain home from work, school, public places until you have been fever free for 24 hours without the use of antifever medications such as Tylenol or ibuprofen.  Conservative care is recommended at this time.  This includes rest, pushing clear fluids and activity as tolerated.  You may also noticed that your appetite is reduced, this is okay as long as they are drinking plenty of clear fluids.  Acetaminophen (Tylenol): This is a good fever reducer.  If there body temperature rises above 101.5 as measured with a thermometer, it is recommended that you give them 1,000 mg every 6-8 hours until they are temperature falls below 101.5, please not take more than 3,000 mg of acetaminophen either as a separate medication or as in ingredient in an over-the-counter cold/flu preparation within a 24-hour period  Ibuprofen  (Advil, Motrin): This is a good anti-inflammatory medication which addresses aches and pains and, to some degree, congestion in the nasal passages.  I recommend giving between 400 to 600 mg every 6-8 hours as needed.  Pseudoephedrine (Sudafed): This is a decongestant.  This medication has to be purchased from the pharmacist counter, I recommend giving 2 tablets, 60 mg, 2-3 times a day as needed to relieve runny nose and sinus drainage.  Guaifenesin (Robitussin, Mucinex): This is an expectorant.  This helps break up chest congestion and loosen up thick nasal drainage making phlegm and drainage more liquid and therefore easier to remove.  I recommend being 400 mg three times daily as needed.  Dextromethorphan (any cough medicine with the letters "DM" added to it's  name such as Robitussin DM): This is a cough suppressant.  This is often recommended to be taken at nighttime to suppress cough and help children sleep.  Give dosage as directed on the bottle.   Chloraseptic Throat Spray: Spray 5 sprays into affected area every 2 hours, hold for 15 seconds and either swallow or spit it out.  This is a excellent numbing medication because it is a spray, you can put it right where you needed and so sucking on a lozenge and numbing your entire mouth.  Please follow-up within the next 3 to 5 days either with your primary care provider or urgent care if your symptoms do not resolve.  If you do not have a primary care provider, we will assist you in finding one.

## 2021-02-03 NOTE — ED Provider Notes (Signed)
[facility]    CSN: [id number] Arrival date & time: 02/03/21  1009    HISTORY   Chief Complaint  Patient presents with   Cough   HPI Tiffany [name] is a 70 y.o. female. Patient complains of a 3 to 4-day history of a barking cough productive of green sputum, green nasal discharge, fatigue and burning in her chest when she coughs.  She states she has difficulty sleeping at night due to the cough and is also experiencing shortness of breath with prolonged cough.  Patient states she had chills 1 day but is unsure whether or not she had a fever.  Per EMR, patient was diagnosed with COVID-19 on December 21, 2020.  Patient states she did have a period of recovery between that illness and today.  Patient states the production of sputum is effortless, denies chest tightness, frank shortness of breath with exertion.  Patient states that her grandchildren ages 65 and 16 were at her home this past week.  The history is provided by the patient.  Past Medical History:  Diagnosis Date   Bipolar 1 disorder ([place])    Breast cancer ([place])    Chronic diarrhea    loose stools twice a day on average for years   Chronic kidney disease (CKD), stage III (moderate) ([place])    Depression 1987   Hospitalization or health care facility admission within last 6 months 04/2019   for fall/seizures   Malignant neoplasm of overlapping sites of left breast in female, estrogen receptor positive ([place]) 03/14/2016   Dx in 09/2014, s/p bilateral mastectomies and ALND, 0/10 LN. 1.4 cm  Grade I invasive lobular, ER and PR +/Her--, Ki 67 <5% Tried anastrozole for one month, but developed suicidal idea   Osteoarthritis, knee 09/24/2019   Xray 09/2019   Parkinson's disease ([place]) 2012   Psoriatic arthritis ([place])    PTSD (post-traumatic stress disorder)    Secondary hyperparathyroidism ([place])    Tardive dyskinesia    Patient Active Problem List   Diagnosis Date Noted   Osteoarthritis, knee 09/24/2019   CKD (chronic  kidney disease) stage 4, GFR 15-29 ml/min ([place]) 09/24/2019   Severe recurrent major depression without psychotic features ([place]) 09/09/2019   Bipolar I disorder, most recent episode depressed ([place])    Transaminitis    Essential hypertension    Encephalopathy 04/26/2019   Leukocytosis 04/21/2019   Thrombocytopenia ([place]) 09/04/2018   Vitamin D deficiency 09/04/2018   Psoriatic arthritis ([place]) 09/04/2018   Tubular adenoma of colon 05/28/2017   History of colonic polyps 05/28/2017   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 09/12/2016   Malignant neoplasm of overlapping sites of left breast in female, estrogen receptor positive ([place]) 03/14/2016   Chronic kidney disease, stage III (moderate) 01/19/2016   Tardive dyskinesia 10/18/2015   History of breast cancer left 2016 12/27/2014   Osteopenia determined by x-ray 10/31/2014   Rosacea 05/21/2007   Bipolar affective disorder, mixed ([place]) 08/16/2004   Acquired hypothyroidism 04/03/1996   Past Surgical History:  Procedure Laterality Date   [place]   MASTECTOMY Bilateral 09/19/2014   TONSILLECTOMY  1970   URETERAL REIMPLANTION Bilateral 1974   OB History   No obstetric history on file.    Home Medications    Prior to Admission medications   Medication Sig Start Date End Date Taking? Authorizing Provider  amLODipine (NORVASC) 5 MG tablet Take  5 mg by mouth daily. 11/28/20  Yes [provider]  calcitRIOL (ROCALTROL) 0.25 MCG capsule Take 1 capsule (0.25 mcg total) by mouth every Monday, Wednesday, and Friday. 09/15/19  Yes [name], [name], MD  cholecalciferol (VITAMIN D3) 25 MCG (1000 UNIT) tablet Take 1,000 Units by mouth daily.   Yes [provider]  lamoTRIgine (LAMICTAL) 200 MG tablet Take 1 tablet (200 mg total) by mouth at bedtime. 01/24/21  Yes Mozingo, Berdie Ogren, NP  levothyroxine (SYNTHROID) 112  MCG tablet TAKE 1 TABLET(112 MCG) BY MOUTH DAILY AT 6 AM 11/27/20  Yes Inda Coke, PA  lithium carbonate 300 MG capsule TAKE 1 CAPSULE(300 MG) BY MOUTH AT BEDTIME 01/24/21  Yes Mozingo, Berdie Ogren, NP  LORazepam (ATIVAN) 1 MG tablet TAKE 1 TABLET(1 MG) BY MOUTH TWICE DAILY AS NEEDED 01/24/21  Yes Mozingo, Berdie Ogren, NP  prazosin (MINIPRESS) 5 MG capsule Take 1 capsule (5 mg total) by mouth at bedtime. 01/29/21  Yes Mozingo, Berdie Ogren, NP  sodium bicarbonate 650 MG tablet Take 650 mg by mouth daily. 12/04/20  Yes [provider]  Certolizumab Pegol (CIMZIA) 2 X 200 MG KIT See admin instructions.    [provider]  metroNIDAZOLE (METROGEL) 0.75 % gel Apply to face 1-2 times daily 06/07/20   Inda Coke, PA   Family History Family History  Problem Relation Age of Onset   Cancer Mother    Cancer Sister    Stroke Maternal Grandfather    Diabetes Paternal Grandfather    Cancer Sister    Social History Social History   Tobacco Use   Smoking status: Never   Smokeless tobacco: Never  Vaping Use   Vaping Use: Never used  Substance Use Topics   Alcohol use: Never   Drug use: Never   Allergies   Aripiprazole, Lactose intolerance (gi), Methotrexate, Cefdinir, Etanercept, Exemestane, Fluoxetine, Lactose, Methylprednisolone sodium succ, Epinephrine, and Nitrofurantoin  Review of Systems Review of Systems Pertinent findings noted in history of present illness.   Physical Exam Triage Vital Signs ED Triage Vitals  Enc Vitals Group     BP 01/05/21 0827 (!) 147/82     Pulse Rate 01/05/21 0827 72     Resp 01/05/21 0827 18     Temp 01/05/21 0827 98.3 F (36.8 C)     Temp Source 01/05/21 0827 Oral     SpO2 01/05/21 0827 98 %     Weight --      Height --      Head Circumference --      Peak Flow --      Pain Score 01/05/21 0826 5     Pain Loc --      Pain Edu? --      Excl. in [place]? --   No data found.  Updated Vital Signs BP (!) 147/67 (BP  Location: Right Arm)   Pulse 72   Temp 98.3 F (36.8 C)   Resp 18   SpO2 98%   Physical Exam Vitals and nursing note reviewed.  Constitutional:      General: She is not in acute distress.    Appearance: Normal appearance. She is not ill-appearing.  HENT:     Head: Normocephalic and atraumatic.     Salivary Glands: Right salivary gland is not diffusely enlarged or tender. Left salivary gland is not diffusely enlarged or tender.     Right Ear: Tympanic membrane, ear canal and external ear normal. No drainage. No middle ear effusion. There is  no impacted cerumen. Tympanic membrane is not erythematous or bulging.     Left Ear: Tympanic membrane, ear canal and external ear normal. No drainage.  No middle ear effusion. There is no impacted cerumen. Tympanic membrane is not erythematous or bulging.     Nose: Nose normal. No nasal deformity, septal deviation, mucosal edema, congestion or rhinorrhea.     Right Turbinates: Not enlarged, swollen or pale.     Left Turbinates: Not enlarged, swollen or pale.     Right Sinus: No maxillary sinus tenderness or frontal sinus tenderness.     Left Sinus: No maxillary sinus tenderness or frontal sinus tenderness.     Mouth/Throat:     Lips: Pink. No lesions.     Mouth: Mucous membranes are moist. No oral lesions.     Pharynx: Oropharynx is clear. Uvula midline. No posterior oropharyngeal erythema or uvula swelling.     Tonsils: No tonsillar exudate. 0 on the right. 0 on the left.  Eyes:     General: Lids are normal.        Right eye: No discharge.        Left eye: No discharge.     Extraocular Movements: Extraocular movements intact.     Conjunctiva/sclera: Conjunctivae normal.     Right eye: Right conjunctiva is not injected.     Left eye: Left conjunctiva is not injected.  Neck:     Trachea: Trachea and phonation normal.  Cardiovascular:     Rate and Rhythm: Normal rate and regular rhythm.     Pulses: Normal pulses.     Heart sounds: Normal  heart sounds. No murmur heard.   No friction rub. No gallop.  Pulmonary:     Effort: Pulmonary effort is normal. No accessory muscle usage, prolonged expiration or respiratory distress.     Breath sounds: Normal breath sounds. No stridor, decreased air movement or transmitted upper airway sounds. No decreased breath sounds, wheezing, rhonchi or rales.     Comments: Deep cough has barking quality in lower lung fields bilaterally Chest:     Chest wall: No tenderness.  Musculoskeletal:        General: Normal range of motion.     Cervical back: Normal range of motion and neck supple. Normal range of motion.  Lymphadenopathy:     Cervical: No cervical adenopathy.  Skin:    General: Skin is warm and dry.     Findings: No erythema or rash.  Neurological:     General: No focal deficit present.     Mental Status: She is alert and oriented to person, place, and time.  Psychiatric:        Mood and Affect: Mood normal.        Behavior: Behavior normal.    Visual Acuity Right Eye Distance:   Left Eye Distance:   Bilateral Distance:    Right Eye Near:   Left Eye Near:    Bilateral Near:     UC Couse / Diagnostics / Procedures:    EKG  Radiology No results found.  Procedures Procedures (including critical care time)  UC Diagnoses / Final Clinical Impressions(s)    Final diagnoses:  Viral URI with cough   I have reviewed the triage vital signs and the nursing notes.  Pertinent labs & imaging results that were available during my care of the patient were reviewed by me and considered in my medical decision making (see chart for details).    Patient presents today with symptoms  consistent with viral upper respiratory infection.  Rapid influenza test today was negative.  Further viral testing was politely declined.  Conservative care recommended. Patient/parent/caregiver verbalized understanding and agreement of plan as discussed.  All questions were addressed during visit.   Please see discharge instructions below for further details of plan.  ED Prescriptions   None    PDMP not reviewed this encounter.  Pending results:  Labs Reviewed  POC INFLUENZA A AND B ANTIGEN (URGENT CARE ONLY)     Medications Ordered in UC: Medications - No data to display  Discharge Instructions:   Discharge Instructions      Rapid influenza testing today was negative.  Despite this, based on the history that you provided for me and my physical exam findings, I believe that you are still suffering from an upper respiratory illness that is viral.   do not see any evidence of bacterial infection therefore treatment with antibiotics would be of no benefit.  Please remain home from work, school, public places until you have been fever free for 24 hours without the use of antifever medications such as Tylenol or ibuprofen.  Conservative care is recommended at this time.  This includes rest, pushing clear fluids and activity as tolerated.  You may also noticed that your appetite is reduced, this is okay as long as they are drinking plenty of clear fluids.  Acetaminophen (Tylenol): This is a good fever reducer.  If there body temperature rises above 101.5 as measured with a thermometer, it is recommended that you give them 1,000 mg every 6-8 hours until they are temperature falls below 101.5, please not take more than 3,000 mg of acetaminophen either as a separate medication or as in ingredient in an over-the-counter cold/flu preparation within a 24-hour period  Ibuprofen  (Advil, Motrin): This is a good anti-inflammatory medication which addresses aches and pains and, to some degree, congestion in the nasal passages.  I recommend giving between 400 to 600 mg every 6-8 hours as needed.  Pseudoephedrine (Sudafed): This is a decongestant.  This medication has to be purchased from the pharmacist counter, I recommend giving 2 tablets, 60 mg, 2-3 times a day as needed to relieve runny nose  and sinus drainage.  Guaifenesin (Robitussin, Mucinex): This is an expectorant.  This helps break up chest congestion and loosen up thick nasal drainage making phlegm and drainage more liquid and therefore easier to remove.  I recommend being 400 mg three times daily as needed.  Dextromethorphan (any cough medicine with the letters "DM" added to it's name such as Robitussin DM): This is a cough suppressant.  This is often recommended to be taken at nighttime to suppress cough and help children sleep.  Give dosage as directed on the bottle.   Chloraseptic Throat Spray: Spray 5 sprays into affected area every 2 hours, hold for 15 seconds and either swallow or spit it out.  This is a excellent numbing medication because it is a spray, you can put it right where you needed and so sucking on a lozenge and numbing your entire mouth.  Please follow-up within the next 3 to 5 days either with your primary care provider or urgent care if your symptoms do not resolve.  If you do not have a primary care provider, we will assist you in finding one.       Disposition Upon Discharge:  Patient presented with an acute illness with associated systemic symptoms and significant discomfort requiring urgent management. In my opinion, this  is a condition that a prudent lay person (someone who possesses an average knowledge of health and medicine) may potentially expect to result in complications if not addressed urgently such as respiratory distress, impairment of bodily function or dysfunction of bodily organs.   Routine symptom specific, illness specific and/or disease specific instructions were discussed with the patient and/or caregiver at length.   As such, the patient has been evaluated and assessed, work-up was performed and treatment was provided in alignment with urgent care protocols and evidence based medicine.  Patient/parent/caregiver has been advised that the patient may require follow up for further  testing and treatment if the symptoms continue in spite of treatment, as clinically indicated and appropriate.  The patient was tested for COVID-19, Influenza and/or RSV, then the patient/parent/guardian was advised to isolate at home pending the results of his/her diagnostic coronavirus test and potentially longer if they're positive. I have also advised pt that if his/her COVID-19 test returns positive, it's recommended to self-isolate for at least 10 days after symptoms first appeared AND until fever-free for 24 hours without fever reducer AND other symptoms have improved or resolved. Discussed self-isolation recommendations as well as instructions for household member/close contacts as per the [facility] and [place] [facility], and also gave patient the [place] packet with this information.  Patient/parent/caregiver has been advised to return to the [facility] or PCP in 3-5 days if no better; to PCP or the Emergency Department if new signs and symptoms develop, or if the current signs or symptoms continue to change or worsen for further workup, evaluation and treatment as clinically indicated and appropriate  The patient will follow up with their current PCP if and as advised. If the patient does not currently have a PCP we will assist them in obtaining one.   The patient may need specialty follow up if the symptoms continue, in spite of conservative treatment and management, for further workup, evaluation, consultation and treatment as clinically indicated and appropriate.  Condition: stable for discharge home Home: take medications as prescribed; routine discharge instructions as discussed; follow up as advised.    Lynden Oxford Scales, PA-C 02/03/21 1911

## 2021-02-05 ENCOUNTER — Ambulatory Visit (HOSPITAL_BASED_OUTPATIENT_CLINIC_OR_DEPARTMENT_OTHER): Payer: Medicare Other | Admitting: Physical Therapy

## 2021-02-05 ENCOUNTER — Encounter (HOSPITAL_BASED_OUTPATIENT_CLINIC_OR_DEPARTMENT_OTHER): Payer: Self-pay

## 2021-02-06 ENCOUNTER — Other Ambulatory Visit: Payer: Self-pay

## 2021-02-06 ENCOUNTER — Ambulatory Visit (INDEPENDENT_AMBULATORY_CARE_PROVIDER_SITE_OTHER): Payer: Medicare Other | Admitting: Psychiatry

## 2021-02-06 DIAGNOSIS — F3132 Bipolar disorder, current episode depressed, moderate: Secondary | ICD-10-CM

## 2021-02-06 DIAGNOSIS — N184 Chronic kidney disease, stage 4 (severe): Secondary | ICD-10-CM

## 2021-02-06 DIAGNOSIS — Z8669 Personal history of other diseases of the nervous system and sense organs: Secondary | ICD-10-CM

## 2021-02-06 DIAGNOSIS — Z638 Other specified problems related to primary support group: Secondary | ICD-10-CM

## 2021-02-06 DIAGNOSIS — F4310 Post-traumatic stress disorder, unspecified: Secondary | ICD-10-CM | POA: Diagnosis not present

## 2021-02-06 DIAGNOSIS — F5105 Insomnia due to other mental disorder: Secondary | ICD-10-CM

## 2021-02-06 DIAGNOSIS — Z634 Disappearance and death of family member: Secondary | ICD-10-CM | POA: Diagnosis not present

## 2021-02-06 DIAGNOSIS — F99 Mental disorder, not otherwise specified: Secondary | ICD-10-CM

## 2021-02-06 NOTE — Progress Notes (Signed)
Psychotherapy Progress Note [facility] [name], PhD LP  Patient ID: Tiffany [name])    MRN: [id number] Therapy format: Individual psychotherapy Date: 02/06/2021      Start: 2:25p     Stop: 3:15p     Time Spent: 50 min Location: In-person   Session narrative (presenting needs, interim history, self-report of stressors and symptoms, applications of prior therapy, status changes, and interventions made in session) Both [name] and [name] went through viral illness over the weekend, including an ER trip.  She without benefit of steroids or antibiotics, just a bit weakened at this point.  Here with rollator today d/t knee and leg issues.    Had a more urgent visit with psychiatry last week after 6mg  prazosin prompted hallucinations.  Backing back to 5mg  resolved them, she says.  Still having intrusive thoughts, but clarifies that they are not all remote, abuse memories but from a collection of times and moments in her life.  Thanksgiving happened, and [name] surprised her by galvanizing to go to relatives' house anyway.  Dealt with it, uncomfortably, but impressed and pleased that she did.    Personally, says she is crying more often, briefly and without any specific referent.  May be as simple as her emotional brain finding its equilibrium through a swarm of changes, or trying to calm overstimulation.  Not worried about it, makes sense enough that it's just self-regulating, not a dramatic need or issue.  Apprehensive somewhat about seeing a 3-week delay to next therapy.  Sleep is going acceptably, still relatively brief wakings.  Acknowledges feelings of wishing for death still pass through, but not actively suicidal.    Doing pretty well overall with [name], but some flashes of anger, occasionally enough to, say, throw her phone.  Usually ties to a moment of perceived neglect or feeling too badly misread by him.  Agreed she has no smoldering resentments, nothing  personal, but a somewhat labile reaction to how the moment feels.  Seems to be grace enough with [name] in how they cope and work things out.  Affirmed and encouraged..  Looking ahead to the next 3 weeks, she basically wants just to have positive time with family, see herself host the next holiday, better accept DIL [name], and get a little more commitment from her about their plans.  Affirmed and encouraged.  Therapeutic modalities: Cognitive Behavioral Therapy, Solution-Oriented/Positive Psychology, and Ego-Supportive  Mental Status/Observations:  Appearance:   Casual     Behavior:  Appropriate  Motor:  Need of rollator  Speech/Language:   Clear and Coherent  Affect:  Appropriate  Mood:  depressed  Thought process:  normal  Thought content:    worry , intrusive memories  Sensory/Perceptual disturbances:    WNL, no hallucinations reported, but intrusive "daymares"  Orientation:  Fully oriented  Attention:  Good    Concentration:  Good  Memory:  grossly intact  Insight:    Fair  Judgment:   Good  Impulse Control:  Good   Risk Assessment: Danger to Self: No Self-injurious Behavior: No Danger to Others: No Physical Aggression / Violence: No Duty to Warn: No Access to Firearms a concern: No  Assessment of progress:  stabilized  Diagnosis:   ICD-10-CM   1. PTSD (post-traumatic stress disorder)  F43.10     2. Bipolar I disorder, most recent episode depressed, moderate ([place])  F31.32     3. Insomnia due to other mental disorder  F51.05    F99  4. Bereavement  Z63.4     5. History of metabolic encephalopathy  K44.61     6. History of small, chronic left frontal infarct, as assessed by neurologist  Z86.69     7. CKD (chronic kidney disease) stage 4, GFR 15-29 ml/min ([facility]) by history  N18.4     8. Relationship problem with family member  Z63.8      Plan:  Same advice about recognizing emotions underneath anger (anxiety, sadness, frustration) and trying to acknowledge the  feeling Let's give prazosin 5mg  a solid try and keep working on sleep quality and acknowledgment/resiliency with intrusive memories and "daymares" Still advise identifying worries and thinking through what she/others would do, and better-case scenarios Other recommendations/advice as may be noted above Continue to utilize previously learned skills ad lib Maintain medication as prescribed and work faithfully with relevant prescriber(s) if any changes are desired or seem indicated Call the clinic on-call service, 988/hotline, 911, or present to [name] or ER if any life-threatening psychiatric crisis Return for put on cancellation list. Already scheduled visit in this office 02/14/2021.  [name], PhD [name], PhD LP Clinical Psychologist, [facility] [address] [place], [place] [address] [phone]

## 2021-02-07 ENCOUNTER — Encounter (HOSPITAL_BASED_OUTPATIENT_CLINIC_OR_DEPARTMENT_OTHER): Payer: Self-pay | Admitting: Physical Therapy

## 2021-02-07 ENCOUNTER — Ambulatory Visit (HOSPITAL_BASED_OUTPATIENT_CLINIC_OR_DEPARTMENT_OTHER): Payer: Medicare Other | Admitting: Physical Therapy

## 2021-02-07 DIAGNOSIS — R2689 Other abnormalities of gait and mobility: Secondary | ICD-10-CM | POA: Diagnosis not present

## 2021-02-07 DIAGNOSIS — M6281 Muscle weakness (generalized): Secondary | ICD-10-CM

## 2021-02-07 DIAGNOSIS — R2681 Unsteadiness on feet: Secondary | ICD-10-CM

## 2021-02-07 NOTE — Therapy (Signed)
[facility] [address] [place], [name], [address] Phone: [phone]   Fax:  [phone]  Physical Therapy Treatment  Patient Details  Name: Tiffany [name] MRN: [id number] Date of Birth: June 29, 1950 Referring Provider (PT): Inda Coke, [place]   Encounter Date: 02/07/2021    Past Medical History:  Diagnosis Date   Bipolar 1 disorder ([place])    Breast cancer ([place])    Chronic diarrhea    loose stools twice a day on average for years   Chronic kidney disease (CKD), stage III (moderate) ([place])    Depression 1987   Hospitalization or health care facility admission within last 6 months 04/2019   for fall/seizures   Malignant neoplasm of overlapping sites of left breast in female, estrogen receptor positive ([place]) 03/14/2016   Dx in 09/2014, s/p bilateral mastectomies and ALND, 0/10 LN. 1.4 cm  Grade I invasive lobular, ER and PR +/Her--, Ki 67 <5% Tried anastrozole for one month, but developed suicidal idea   Osteoarthritis, knee 09/24/2019   Xray 09/2019   Parkinson's disease ([place]) 2012   Psoriatic arthritis ([place])    PTSD (post-traumatic stress disorder)    Secondary hyperparathyroidism ([place])    Tardive dyskinesia     Past Surgical History:  Procedure Laterality Date   [place] Bilateral 09/19/2014   TONSILLECTOMY  1970   URETERAL REIMPLANTION Bilateral 1974    There were no vitals filed for this visit.   Subjective Assessment - 02/07/21 0941     Subjective Patient reports that her right leg has been hurting her over the past week or so. The pain is significant today. She also has had a vrial infection. She    Limitations Standing;Walking;House hold activities    How long can you sit comfortably? no issues    How long can you stand comfortably? Could be immediate or an hour. For sure after an hour.    How long can you walk  comfortably? has to use a device for limited distances    Patient Stated Goals Getting stronger, improving balance, pain free    Currently in Pain? Yes    Pain Score 5     Pain Location Leg    Pain Orientation Left    Pain Descriptors / Indicators Aching;Throbbing    Pain Type Chronic pain    Pain Onset More than a month ago    Pain Frequency Intermittent    Aggravating Factors  increased standing and walking    Pain Relieving Factors has been able to control it so far with light stretches    Multiple Pain Sites No               Manual therapy: trigger point release to upper gluteals and lateral hip -> down IT band. Gentle LAD with knee under pillow. Decreased knee extension today.   Seated Hamstring stretch 3x20 sec hold bilateral  LTR: Supine x20   Reviewed self soft tissue mobilization using a tennis ball; thera-cane, and roller. Reviewed where to purchase thera-cane; reviewed referral patterns for trigger points.                             PT Short Term Goals - 01/15/21 1408       PT SHORT TERM GOAL #1   Title Pt will improve tug with rollater below  14 secs to show improvement in ambulation.    Baseline 22 seconds (from previous episode)    Time 3    Period Weeks    Status On-going    Target Date 01/01/21      PT SHORT TERM GOAL #2   Title Patient will improve strength for bilateral hip flexion to 5/5.    Baseline 4+/5 ( improved from previosu episode    Time 3    Period Weeks    Status On-going    Target Date 12/25/20      PT SHORT TERM GOAL #3   Title Patient will performed 5x sit to stand below 10  secs to show improvement is functional strenght.    Baseline 16 sec    Time 4    Period Weeks    Status On-going               PT Long Term Goals - 01/15/21 1409       PT LONG TERM GOAL #1   Title Pt will be be able to perform six minute walk test and ambulate 700 feet without a device or with a less restricted device such as  a cane independently in order to ambualte in the community    Time 8    Period Weeks    Status On-going      PT LONG TERM GOAL #2   Title Pt will go up and down 6 steps without increased pain    Baseline 3 steps    Time 6    Period Weeks    Status New      PT LONG TERM GOAL #3   Title Patient will be independent with pool program and general strengthening program in order to continue to progress mobility    Time 6    Period Weeks    Status On-going                   Plan - 02/07/21 1007     Clinical Impression Statement Patient tolerated treatment well depsite baseline pain. Treatment was geared more towards pain relief. She had significant spasming in her glut medius and glut minimus. Therapy performed manual trigger point release to those areas and the IT band. She was then shown different ways to do it on her own. Therapy also reviewed stretching exercises for home.    Personal Factors and Comorbidities Fitness;Time since onset of injury/illness/exacerbation;Comorbidity 3+    Comorbidities multiple pain locations, back, hip, knee, Decreased mobility post covid and hospitalization last year,    Examination-Activity Limitations Stand;Locomotion Level;Bend;Dressing;Squat;Stairs    Examination-Participation Restrictions Cleaning;Meal Prep;Yard Work;Community Activity;Shop;Laundry    Stability/Clinical Decision Making Evolving/Moderate complexity    Rehab Potential Good    PT Frequency 2x / week    PT Duration 6 weeks    PT Treatment/Interventions ADLs/Self Care Home Management;Cryotherapy;Electrical Stimulation;Ultrasound;Traction;Moist Heat;Iontophoresis 4mg /ml Dexamethasone;DME Instruction;Gait training;Stair training;Functional mobility training;Therapeutic activities;Therapeutic exercise;Orthotic Fit/Training;Patient/family education;Neuromuscular re-education;Balance training;Manual techniques;Taping;Dry needling;Passive range of motion;Spinal Manipulations;Joint  Manipulations;Aquatic Therapy;Vasopneumatic Device    PT Next Visit Plan begin porgresisve strengthening in the pool. Be aware of mobility in and out of the pool. Work on balance exercises. Genral strengthening of the lower extremitys;    PT Home Exercise Plan Access Code: [facility]  URL: [email or web address]  Date: 09/06/2020  Prepared by: Carolyne Littles    Exercises  Seated Knee Extension with Resistance - 1 x daily - 7 x weekly - 3 sets - 10 reps  Seated March with Resistance -  1 x daily - 7 x weekly - 3 sets - 10 reps  Seated Hip Abduction with Resistance - 1 x daily - 7 x weekly - 3 sets - 10 reps    Consulted and Agree with Plan of Care Patient             Patient will benefit from skilled therapeutic intervention in order to improve the following deficits and impairments:  Abnormal gait, Pain, Increased muscle spasms, Decreased activity tolerance, Decreased endurance, Decreased range of motion, Decreased strength, Impaired flexibility, Difficulty walking, Decreased balance, Decreased safety awareness  Visit Diagnosis: Other abnormalities of gait and mobility  Unsteadiness on feet  Muscle weakness (generalized)     Problem List Patient Active Problem List   Diagnosis Date Noted   Osteoarthritis, knee 09/24/2019   CKD (chronic kidney disease) stage 4, GFR 15-29 ml/min ([place]) 09/24/2019   Severe recurrent major depression without psychotic features ([place]) 09/09/2019   Bipolar I disorder, most recent episode depressed ([place])    Transaminitis    Essential hypertension    Encephalopathy 04/26/2019   Leukocytosis 04/21/2019   Thrombocytopenia ([place]) 09/04/2018   Vitamin D deficiency 09/04/2018   Psoriatic arthritis ([place]) 09/04/2018   Tubular adenoma of colon 05/28/2017   History of colonic polyps 05/28/2017   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 [place], [name], [address] Phone: [phone]   Fax:  [phone]  Name: Tiffany [name] MRN: [id number] Date of Birth: April 24, 1950

## 2021-02-10 ENCOUNTER — Encounter: Payer: Self-pay | Admitting: Physician Assistant

## 2021-02-12 ENCOUNTER — Ambulatory Visit (HOSPITAL_BASED_OUTPATIENT_CLINIC_OR_DEPARTMENT_OTHER): Payer: Medicare Other | Attending: Physician Assistant | Admitting: Physical Therapy

## 2021-02-12 ENCOUNTER — Other Ambulatory Visit: Payer: Self-pay

## 2021-02-12 ENCOUNTER — Encounter (HOSPITAL_BASED_OUTPATIENT_CLINIC_OR_DEPARTMENT_OTHER): Payer: Self-pay | Admitting: Physical Therapy

## 2021-02-12 DIAGNOSIS — R2689 Other abnormalities of gait and mobility: Secondary | ICD-10-CM | POA: Diagnosis not present

## 2021-02-12 DIAGNOSIS — R2681 Unsteadiness on feet: Secondary | ICD-10-CM | POA: Insufficient documentation

## 2021-02-12 DIAGNOSIS — M25652 Stiffness of left hip, not elsewhere classified: Secondary | ICD-10-CM | POA: Diagnosis present

## 2021-02-12 DIAGNOSIS — M25552 Pain in left hip: Secondary | ICD-10-CM | POA: Insufficient documentation

## 2021-02-12 DIAGNOSIS — M6281 Muscle weakness (generalized): Secondary | ICD-10-CM | POA: Diagnosis present

## 2021-02-13 ENCOUNTER — Encounter (HOSPITAL_BASED_OUTPATIENT_CLINIC_OR_DEPARTMENT_OTHER): Payer: Self-pay | Admitting: Physical Therapy

## 2021-02-13 ENCOUNTER — Ambulatory Visit (INDEPENDENT_AMBULATORY_CARE_PROVIDER_SITE_OTHER): Payer: Medicare Other | Admitting: Psychiatry

## 2021-02-13 DIAGNOSIS — F5105 Insomnia due to other mental disorder: Secondary | ICD-10-CM

## 2021-02-13 DIAGNOSIS — Z8669 Personal history of other diseases of the nervous system and sense organs: Secondary | ICD-10-CM

## 2021-02-13 DIAGNOSIS — F411 Generalized anxiety disorder: Secondary | ICD-10-CM

## 2021-02-13 DIAGNOSIS — F4310 Post-traumatic stress disorder, unspecified: Secondary | ICD-10-CM

## 2021-02-13 DIAGNOSIS — F3132 Bipolar disorder, current episode depressed, moderate: Secondary | ICD-10-CM

## 2021-02-13 DIAGNOSIS — R41840 Attention and concentration deficit: Secondary | ICD-10-CM

## 2021-02-13 DIAGNOSIS — U099 Post covid-19 condition, unspecified: Secondary | ICD-10-CM

## 2021-02-13 DIAGNOSIS — N184 Chronic kidney disease, stage 4 (severe): Secondary | ICD-10-CM

## 2021-02-13 DIAGNOSIS — F99 Mental disorder, not otherwise specified: Secondary | ICD-10-CM

## 2021-02-13 NOTE — Therapy (Signed)
[facility] [place], [name], [address] Phone: [phone]   Fax:  [phone]  Physical Therapy Treatment  Patient Details  Name: Tiffany [name] MRN: [id number] Date of Birth: 1950-10-10 Referring Provider (PT): Inda Coke, [place]   Encounter Date: 02/12/2021   PT End of Session - 02/12/21 1300     Visit Number 5    Number of Visits 15    Date for PT Re-Evaluation 02/26/21    Authorization Type Medicare    PT Start Time 2423    PT Stop Time 1058    PT Time Calculation (min) 43 min    Activity Tolerance Patient tolerated treatment well    Behavior During Therapy WFL for tasks assessed/performed             Past Medical History:  Diagnosis Date   Bipolar 1 disorder ([place])    Breast cancer ([place])    Chronic diarrhea    loose stools twice a day on average for years   Chronic kidney disease (CKD), stage III (moderate) ([place])    Depression 1987   Hospitalization or health care facility admission within last 6 months 04/2019   for fall/seizures   Malignant neoplasm of overlapping sites of left breast in female, estrogen receptor positive ([place]) 03/14/2016   Dx in 09/2014, s/p bilateral mastectomies and ALND, 0/10 LN. 1.4 cm  Grade I invasive lobular, ER and PR +/Her--, Ki 67 <5% Tried anastrozole for one month, but developed suicidal idea   Osteoarthritis, knee 09/24/2019   Xray 09/2019   Parkinson's disease ([place]) 2012   Psoriatic arthritis ([place])    PTSD (post-traumatic stress disorder)    Secondary hyperparathyroidism ([place])    Tardive dyskinesia     Past Surgical History:  Procedure Laterality Date   [place] Bilateral 09/19/2014   TONSILLECTOMY  1970   URETERAL REIMPLANTION Bilateral 1974    There were no vitals filed for this visit.   Subjective Assessment - 02/12/21 1032     Subjective Patient  reports she has been having increase dpain in her right knee. She reports her hip and back are a little better. She is not sure though if her pain in her knee is coming from her back.    Limitations Standing;Walking;House hold activities    How long can you sit comfortably? no issues    How long can you stand comfortably? Could be immediate or an hour. For sure after an hour.    How long can you walk comfortably? has to use a device for limited distances    Patient Stated Goals Getting stronger, improving balance, pain free    Currently in Pain? Yes    Pain Score 8     Pain Location Knee    Pain Orientation Left    Pain Descriptors / Indicators Aching    Pain Type Chronic pain    Pain Onset More than a month ago    Pain Frequency Intermittent    Aggravating Factors  pain standing and walking    Pain Relieving Factors pain with light stretching.    Effect of Pain on Daily Activities limits daily activity    Multiple Pain Sites No                   Supine Stretches : LTR 2x10  Pirifromis stretch 3x20 sec bilateral  Seated: hamstring stretch 3x20 sec bilateral   Manual: Patient has obtained her own thera-cane: patient shown the spots that will be beneficial for her. Her husband was also present Reviewed self patella mobilization as a potential sorce of her knee catching  Manual PA and AP mobilizations of the right knee  Manual trigger point release to hip and IT band   Exercise:   Nu-step Level 1 with mod cuing for range and pace. Patient going to far into range at first but withcuing she was able to loosen her knee up and had an improvement in pain   Quad set: reviewed for use of home 3x10; SLR 3x5 with cuing for technique  Seated clamshell 3x10                       PT Education - 02/13/21 0634     Education Details reviewed eercises for her knee    Person(s) Educated Patient    Methods Explanation;Demonstration;Verbal cues;Tactile cues     Comprehension Verbalized understanding;Returned demonstration;Verbal cues required;Tactile cues required              PT Short Term Goals - 02/13/21 0639       PT SHORT TERM GOAL #1   Title Pt will improve tug with rollater below 14 secs to show improvement in ambulation.    Baseline 22 seconds (from previous episode)    Time 3    Period Weeks    Status On-going      PT SHORT TERM GOAL #2   Title Patient will improve strength for bilateral hip flexion to 5/5.    Baseline 4+/5 ( improved from previosu episode    Time 3    Period Weeks    Status On-going      PT SHORT TERM GOAL #3   Title Patient will performed 5x sit to stand below 10  secs to show improvement is functional strenght.    Time 4    Period Weeks    Status On-going    Target Date 10/04/20      PT SHORT TERM GOAL #4   Title Pt will improve 5x sit<>stand to less than or equal to 18 seconds for improved functional strength.    Baseline 16.93 sec 06/30/2019    Time 4    Period Weeks    Status Achieved               PT Long Term Goals - 01/15/21 1409       PT LONG TERM GOAL #1   Title Pt will be be able to perform six minute walk test and ambulate 700 feet without a device or with a less restricted device such as a cane independently in order to ambualte in the community    Time 8    Period Weeks    Status On-going      PT LONG TERM GOAL #2   Title Pt will go up and down 6 steps without increased pain    Baseline 3 steps    Time 6    Period Weeks    Status New      PT LONG TERM GOAL #3   Title Patient will be independent with pool program and general strengthening program in order to continue to progress mobility    Time 6    Period Weeks    Status On-going  Plan - 02/12/21 1054     Clinical Impression Statement It was difficult to tell if her pain was from her knee today or if itis radicualr pain down her IT band area into her lateral knee. It is [place] both.  Therapy reviewed exercises she can do to reduce her knee inflamation. She can not straighten her knee out all the way. Therapy attmepted mobilization which did not improve the knee extension much. She has fair patellar mobility for the amount of extension she is missing. She feels like the leg has been buckling which is a problem. She does not use her walker in the house which puts her at a fall risk and likely exacerbates her hip. She was advised to use her walker until she improves her strength and balance. She agrees this is the safest idea. She was shown exercises for her knee and hip and tolerated well> She was advised to focus on 3 exercises andher hip/ low back stretches intil her symptoms improve.    Personal Factors and Comorbidities Fitness;Time since onset of injury/illness/exacerbation;Comorbidity 3+    Comorbidities multiple pain locations, back, hip, knee, Decreased mobility post covid and hospitalization last year,    Examination-Activity Limitations Stand;Locomotion Level;Bend;Dressing;Squat;Stairs    Examination-Participation Restrictions Cleaning;Meal Prep;Yard Work;Community Activity;Shop;Laundry    Stability/Clinical Decision Making Evolving/Moderate complexity    Clinical Decision Making Moderate    Rehab Potential Good    PT Frequency 2x / week    PT Duration 6 weeks    PT Treatment/Interventions ADLs/Self Care Home Management;Cryotherapy;Electrical Stimulation;Ultrasound;Traction;Moist Heat;Iontophoresis 4mg /ml Dexamethasone;DME Instruction;Gait training;Stair training;Functional mobility training;Therapeutic activities;Therapeutic exercise;Orthotic Fit/Training;Patient/family education;Neuromuscular re-education;Balance training;Manual techniques;Taping;Dry needling;Passive range of motion;Spinal Manipulations;Joint Manipulations;Aquatic Therapy;Vasopneumatic Device    PT Next Visit Plan begin porgresisve strengthening in the pool. Be aware of mobility in and out of the pool. Work  on balance exercises. Genral strengthening of the lower extremitys;    PT Home Exercise Plan Access Code: [employer]  URL: [email or web address]  Date: 09/06/2020  Prepared by: Carolyne Littles    Exercises  Seated Knee Extension with Resistance - 1 x daily - 7 x weekly - 3 sets - 10 reps  Seated March with Resistance - 1 x daily - 7 x weekly - 3 sets - 10 reps  Seated Hip Abduction with Resistance - 1 x daily - 7 x weekly - 3 sets - 10 reps    Consulted and Agree with Plan of Care Patient             Patient will benefit from skilled therapeutic intervention in order to improve the following deficits and impairments:  Abnormal gait, Pain, Increased muscle spasms, Decreased activity tolerance, Decreased endurance, Decreased range of motion, Decreased strength, Impaired flexibility, Difficulty walking, Decreased balance, Decreased safety awareness  Visit Diagnosis: Other abnormalities of gait and mobility  Unsteadiness on feet  Muscle weakness (generalized)  Pain in left hip     Problem List Patient Active Problem List   Diagnosis Date Noted   Osteoarthritis, knee 09/24/2019   CKD (chronic kidney disease) stage 4, GFR 15-29 ml/min ([place]) 09/24/2019   Severe recurrent major depression without psychotic features ([place]) 09/09/2019   Bipolar I disorder, most recent episode depressed ([place])    Transaminitis    Essential hypertension    Encephalopathy 04/26/2019   Leukocytosis 04/21/2019   Thrombocytopenia ([place]) 09/04/2018   Vitamin D deficiency 09/04/2018   Psoriatic arthritis ([place]) 09/04/2018   Tubular adenoma of colon 05/28/2017   History of colonic polyps 05/28/2017   Reaction to  QuantiFERON-TB test (QFT) without active tuberculosis 09/12/2016   Malignant neoplasm of overlapping sites of left breast in female, estrogen receptor positive ([place]) 03/14/2016   Chronic kidney disease, stage III (moderate) 01/19/2016   Tardive dyskinesia 10/18/2015   History of breast cancer  left 2016 12/27/2014   Osteopenia determined by x-ray 10/31/2014   Rosacea 05/21/2007   Bipolar affective disorder, mixed ([place]) 08/16/2004   Acquired hypothyroidism 04/03/1996    Carney Living, PT 02/13/2021, 10:39 AM  [facility] [address] [place], [name], [address] Phone: [phone]   Fax:  [phone]  Name: Tiffany [name] MRN: [id number] Date of Birth: Oct 17, 1950

## 2021-02-13 NOTE — Progress Notes (Signed)
Psychotherapy Progress Note [facility] [name], PhD LP  Patient ID: Tiffany [name])    MRN: [id number] Therapy format: Individual psychotherapy Date: 02/13/2021      Start: 4:13p     Stop: 5:03p     Time Spent: 50 min Location: In-person   Session narrative (presenting needs, interim history, self-report of stressors and symptoms, applications of prior therapy, status changes, and interventions made in session) Been feeling some grief and rejection lately, but nothing suicidal.  Disaffected with S Barb making decisions on ways to honor recently d/c S [name] without involving her, but actively keeping herself from personalizing it.  While getting along well, says H lamely told her she should have informed Barb she wanted to be in one such project; as told, she asserted matter of factly how it affected her, and got well listened to.  Encouraged her to thank him and reaffirm as needed what she would prefer to receive, just empathy, when she shares a hurt.  Seems clear she is not stewing.  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  Other considerations lately about going on a family gathering to [place], decided too much uncertainty and hardship.    [address] work stress situation is still painful, both to feel and to hear, including feeling marginalized by coteachers and more recently having to take hostile parents' criticism.  Good counsel given by [name] to reach out to her principal, take the chance to ask for her help, very positive experience.  Otherwise, daily habit of going out to eat and [name] and [name] letting her vent.    No new professions of worry/stress within family, overall getting along well with [name].  Physical therapy for knee proceeding, difficult but with benefit.  No complaints  offered of insomnia or NMs and she seems more rested and responsive today.    Therapeutic modalities: Cognitive Behavioral Therapy, Solution-Oriented/Positive Psychology, Faith-sensitive, and Assertiveness/Communication  Mental Status/Observations:  Appearance:   Casual and Neat     Behavior:  Appropriate  Motor:  rollator  required for knee injury, residual invol mouth movement and hand tremor  Speech/Language:   Clear and Coherent  Affect:  Appropriate  Mood:  dysthymic and more normal  Thought process:  normal  Thought content:    WNL  Sensory/Perceptual disturbances:    WNL  Orientation:  Fully oriented  Attention:  Good    Concentration:  Good  Memory:  WNL  Insight:    Good  Judgment:   Good  Impulse Control:  Good   Risk Assessment: Danger to Self: No Self-injurious Behavior: No Danger to Others: No Physical Aggression / Violence: No Duty to Warn: No Access to Firearms a concern: No  Assessment of progress:  progressing  Diagnosis:   ICD-10-CM   1. Generalized anxiety disorder  F41.1     2. PTSD (post-traumatic stress disorder)  F43.10    more stable     3. Insomnia due to other mental disorder  F51.05    F99    improved     4. Bipolar I disorder, most recent episode depressed, moderate ([place])  F31.32     5. 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 History of metabolic encephalopathy  Z86.69     7. CKD (chronic kidney disease) stage 4, GFR 15-29 ml/min (HCC) by history  N18.4      Plan:  Continue to practice matter-of-fact assertiveness where needed in shaping personal support, and offering it.  Refer to 5-step request for change format as needed, advocate for just empathy as needed.  "I know, and I would like to get the chance to be involved" (broken record technique) as response to [place] defending or martyring if it happens. Continue prior practices for sleep readiness, settling worry and  anti-rumination about hurts in life Continue faithfully with PT for multiple benefits Other recommendations/advice as may be noted above Continue to utilize previously learned skills ad lib Maintain medication as prescribed and work faithfully with relevant prescriber(s) if any changes are desired or seem indicated Call the clinic on-call service, 988/hotline, 911, or present to [facility] or ER if any life-threatening psychiatric crisis Return for session(s) already scheduled, available earlier @ PT's need. Already scheduled visit in this office 02/14/2021.  [name], PhD [name], PhD LP Clinical Psychologist, [facility] [address] [place], [place] [address] [phone]

## 2021-02-14 ENCOUNTER — Encounter (HOSPITAL_BASED_OUTPATIENT_CLINIC_OR_DEPARTMENT_OTHER): Payer: Self-pay

## 2021-02-14 ENCOUNTER — Encounter: Payer: Self-pay | Admitting: Adult Health

## 2021-02-14 ENCOUNTER — Ambulatory Visit (HOSPITAL_BASED_OUTPATIENT_CLINIC_OR_DEPARTMENT_OTHER): Payer: Medicare Other | Admitting: Physical Therapy

## 2021-02-14 ENCOUNTER — Other Ambulatory Visit: Payer: Self-pay

## 2021-02-14 ENCOUNTER — Ambulatory Visit (INDEPENDENT_AMBULATORY_CARE_PROVIDER_SITE_OTHER): Payer: Medicare Other | Admitting: Adult Health

## 2021-02-14 DIAGNOSIS — F4310 Post-traumatic stress disorder, unspecified: Secondary | ICD-10-CM

## 2021-02-14 DIAGNOSIS — F3132 Bipolar disorder, current episode depressed, moderate: Secondary | ICD-10-CM

## 2021-02-14 DIAGNOSIS — G4700 Insomnia, unspecified: Secondary | ICD-10-CM

## 2021-02-14 DIAGNOSIS — F5105 Insomnia due to other mental disorder: Secondary | ICD-10-CM | POA: Diagnosis not present

## 2021-02-14 DIAGNOSIS — F411 Generalized anxiety disorder: Secondary | ICD-10-CM | POA: Diagnosis not present

## 2021-02-14 DIAGNOSIS — F99 Mental disorder, not otherwise specified: Secondary | ICD-10-CM

## 2021-02-14 NOTE — Progress Notes (Signed)
Tiffany [name] [id number] March 04, 1951 70 y.o.  Subjective:   Patient ID:  Tiffany [name] is a 70 y.o. (DOB [birth year]) female.  Chief Complaint: No chief complaint on file.   HPI Tiffany [name] presents to the office today for follow-up of PTSD, insomnia, GAD, BPD 1.  Describes mood today as "so-so". Pleasant. Denies tearfulness. Mood symptoms - reports depression and anxiety. Feels "hopeless" on and off". Stating "I'm trying to stay ahead of it". Denies irritability. Stating "I'm trying to look at the good things". Thinking about sister - Tiffany [name] this weekend in [place]. May need a knee replacement. Seeing Dr. Rica Mote weekly. Varying interest and motivation. Taking medications as prescribed. Energy levels vary. Active, does not have a regular exercise routine. Going to P/T twice a week. Enjoys some usual interests and activities. Married. Lives with husband their daughter. Talking to family and friends.  Appetite adequate. Weight stable.  Sleeps well most nights. Averages 9 hours of broken sleep. Focus and concentration difficulties at times. Completing tasks. Managing some aspects of household. Retired.  Denies SI - "no plans or intents". Denies HI.  Denies AH Denies VH  Reports flashbacks almost daily - not debilitating - things over the past 10 years.  Previous medications: Celexa, Zyprexa, Tegretol, Depakote, Serzone, Topamax, Seroquel, Effexor, Lexapro, Desipramine, Neurontin, Abilify, Geodon, Propanolol, Cymbalta, Cogentin, Trihexyphenadyl, Sinmmet, Provigil, Selegiline, Requip, Amantadine, Prozac, Mirapex, Azilect, Metoclopramide, Baclofen, Artane, Namenda, Latuda.    GAD-7    Flowsheet Row Office Visit from 05/14/2019 in [place]  Total GAD-7 Score 21      [place] Office Visit from 06/07/2019 in [place]  Total Score (max 30 points ) 29      PHQ2-9    Flowsheet Row Counselor from 07/26/2020 in  [place] Visit from 06/05/2020 in [place] from 03/16/2020 in [place] Visit from 09/16/2019 in [place] Visit from 08/24/2019 in [place]  PHQ-2 Total Score _0 0 0  PHQ-9 Total Score -- 11 3 -- 4      Flowsheet Row ED from 02/03/2021 in [facility] ED from 12/21/2020 in [facility] ED from 09/08/2019 in [place] DEPT  C-SSRS RISK CATEGORY No Risk No Risk High Risk        Review of Systems:  Review of Systems  Musculoskeletal:  Negative for gait problem.  Neurological:  Negative for tremors.  Psychiatric/Behavioral:         Please refer to HPI   Medications: I have reviewed the patient's current medications.  Current Outpatient Medications  Medication Sig Dispense Refill   amLODipine (NORVASC) 5 MG tablet Take 5 mg by mouth daily.     calcitRIOL (ROCALTROL) 0.25 MCG capsule Take 1 capsule (0.25 mcg total) by mouth every Monday, Wednesday, and Friday. 12 capsule 0   Certolizumab Pegol (CIMZIA) 2 X 200 MG KIT See admin instructions.     cholecalciferol (VITAMIN D3) 25 MCG (1000 UNIT) tablet Take 1,000 Units by mouth daily.     lamoTRIgine (LAMICTAL) 200 MG tablet Take 1 tablet (200 mg total) by mouth at bedtime. 90 tablet 3   levothyroxine (SYNTHROID) 112 MCG tablet TAKE 1 TABLET(112 MCG) BY MOUTH DAILY AT 6 AM 30 tablet 0   lithium carbonate 300 MG capsule TAKE 1 CAPSULE(300 MG) BY MOUTH AT BEDTIME 90 capsule 3  LORazepam (ATIVAN) 1 MG tablet TAKE 1 TABLET(1 MG) BY MOUTH TWICE DAILY AS NEEDED 60 tablet 2   metroNIDAZOLE (METROGEL) 0.75 % gel Apply to face 1-2 times daily 45 g 0   prazosin (MINIPRESS) 5 MG capsule Take 1 capsule (5 mg total) by mouth at bedtime. 90 capsule 3   sodium bicarbonate 650 MG tablet Take 650 mg by mouth daily.      No current facility-administered medications for this visit.    Medication Side Effects: None  Allergies:  Allergies  Allergen Reactions   Aripiprazole Other (See Comments)    Parkinsonism      Lactose Intolerance (Gi) Diarrhea   Methotrexate Other (See Comments)    Hair loss, severe stomatitis     Cefdinir Diarrhea    Other reaction(s): Diarrhea Yeast infection and fever; negative c diff   Etanercept Other (See Comments)    Headaches     Exemestane Other (See Comments)    Suicidal thoughts with medication     Fluoxetine Other (See Comments)    Parkinsonism   Lactose     Other reaction(s): diarrhea Other reaction(s): diarrhea   Methylprednisolone Sodium Succ Other (See Comments)    Agitated mania   Epinephrine Palpitations    tachycardia    Nitrofurantoin Nausea And Vomiting and Rash      Other reaction(s): rash, diarrhea Other reaction(s): rash, diarrhea Other reaction(s): rash, diarrhea    Past Medical History:  Diagnosis Date   Bipolar 1 disorder (HCC)    Breast cancer (HCC)    Chronic diarrhea    loose stools twice a day on average for years   Chronic kidney disease (CKD), stage III (moderate) ([place])    Depression 1987   Hospitalization or health care facility admission within last 6 months 04/2019   for fall/seizures   Malignant neoplasm of overlapping sites of left breast in female, estrogen receptor positive ([place]) 03/14/2016   Dx in 09/2014, s/p bilateral mastectomies and ALND, 0/10 LN. 1.4 cm  Grade I invasive lobular, ER and PR +/Her--, Ki 67 <5% Tried anastrozole for one month, but developed suicidal idea   Osteoarthritis, knee 09/24/2019   Xray 09/2019   Parkinson's disease ([place]) 2012   Psoriatic arthritis ([place])    PTSD (post-traumatic stress disorder)    Secondary hyperparathyroidism ([place])    Tardive dyskinesia     Past Medical History, Surgical history, Social history, and Family history were reviewed and updated as appropriate.   Please  see review of systems for further details on the patient's review from today.   Objective:   Physical Exam:  There were no vitals taken for this visit.  Physical Exam Constitutional:      General: She is not in acute distress. Musculoskeletal:        General: No deformity.  Neurological:     Mental Status: She is alert and oriented to person, place, and time.     Coordination: Coordination normal.  Psychiatric:        Attention and Perception: Attention and perception normal. She does not perceive auditory or visual hallucinations.        Mood and Affect: Mood normal. Mood is not anxious or depressed. Affect is not labile, blunt, angry or inappropriate.        Speech: Speech normal.        Behavior: Behavior normal.        Thought Content: Thought content normal. Thought content is not paranoid or delusional. Thought content does not include  homicidal or suicidal ideation. Thought content does not include homicidal or suicidal plan.        Cognition and Memory: Cognition and memory normal.        Judgment: Judgment normal.     Comments: Insight intact    Lab Review:     Component Value Date/Time   NA 142 02/02/2021 1012   NA 138 06/08/2019 0000   K 4.4 02/02/2021 1012   CL 109 02/02/2021 1012   CO2 24 02/02/2021 1012   GLUCOSE 92 02/02/2021 1012   BUN 28 (H) 02/02/2021 1012   BUN 29 (A) 06/08/2019 0000   CREATININE 2.03 (H) 02/02/2021 1012   CALCIUM 9.3 02/02/2021 1012   PROT 7.0 12/03/2019 1105   ALBUMIN 4.0 09/10/2019 0851   AST 16 04/10/2020 0000   ALT 13 04/10/2020 0000   ALKPHOS 145 (A) 04/10/2020 0000   BILITOT 1.0 12/03/2019 1105   GFRNONAA 22 (L) 12/21/2020 1525   GFRAA 25 (L) 09/13/2019 1243       Component Value Date/Time   WBC 8.4 12/21/2020 1525   RBC 4.49 12/21/2020 1525   HGB 13.4 12/21/2020 1525   HCT 42.3 12/21/2020 1525   PLT 177 12/21/2020 1525   MCV 94.2 12/21/2020 1525   MCH 29.8 12/21/2020 1525   MCHC 31.7 12/21/2020 1525   RDW 13.4  12/21/2020 1525   LYMPHSABS 2.5 04/27/2019 0544   MONOABS 0.5 04/27/2019 0544   EOSABS 0.2 04/27/2019 0544   BASOSABS 0.0 04/27/2019 0544    Lithium Lvl  Date Value Ref Range Status  02/02/2021 0.6 0.6 - 1.2 mmol/L Final     No results found for: PHENYTOIN, PHENOBARB, VALPROATE, CBMZ   .res Assessment: Plan:    Plan:  Therapist - Andy Mitchum   Lorazepam 20m BID for anxiety - make take one tablet extra for severe anxiety symptoms. Taking one at night routinely. Lamictal 2063mat hs/5022mn the morning.  Lithium 300m43mily  Prazosin 5mg 75mbedtime - decreased from 6mg a45ms - dose increased 3 days ago.  Labs - lithium level, TSH, CMP - October  RTC 4/6 weeks  Counseled patient regarding potential benefits, risks, and side effects of Lamictal to include potential risk of Stevens-Johnson syndrome. Advised patient to stop taking Lamictal and contact office immediately if rash develops and to seek urgent medical attention if rash is severe and/or spreading quickly.  Discussed potential benefits, risk, and side effects of benzodiazepines to include potential risk of tolerance and dependence, as well as possible drowsiness.  Advised patient not to drive if experiencing drowsiness and to take lowest possible effective dose to minimize risk of dependence and tolerance.    There are no diagnoses linked to this encounter.   Please see After Visit Summary for patient specific instructions.  Future Appointments  Date Time Provider DepartHenderson/2022  2:00 PM Wor[facility]Inda CokeBP[facility]  1[facility]2/2022 10:15 AM CarrolCarney LivingWB-REH DWB  02/21/2021 10:15 AM CarrolCarney LivingWB-REH DWB  02/26/2021 10:15 AM CarrolCarney LivingWB-REH DWB  02/28/2021 10:15 AM CarrolCarney LivingWB-REH DWB  03/06/2021  1:45 PM CarrolCarney LivingWB-RE[facility] DWB  03/08/2021 12:30 PM CarrolCarney LivingWB-RE[facility] DW[facility]  03/16/2021 11:00 AM MitchuBlanchie ServeCP-CP None  03/22/2021   2:30 PM [facility] HEALTH COACH [facility] PEC  03/23/2021 10:00 AM MitchuBlanchie ServeCP-CP None  03/28/2021  2:00 PM Breana Litts, ReginaBerdie OgrenP-CP None  03/30/2021 11:00 AM [name], PhD CP-CP None  04/06/2021  2:00 PM [name], PhD CP-CP None  04/13/2021  2:00 PM [name], PhD CP-CP None  04/20/2021  2:00 PM [name], PhD CP-CP None    No orders of the defined types were placed in this encounter.   -------------------------------

## 2021-02-16 ENCOUNTER — Other Ambulatory Visit: Payer: Self-pay

## 2021-02-16 ENCOUNTER — Encounter: Payer: Self-pay | Admitting: Physician Assistant

## 2021-02-16 ENCOUNTER — Ambulatory Visit (INDEPENDENT_AMBULATORY_CARE_PROVIDER_SITE_OTHER): Payer: Medicare Other | Admitting: Physician Assistant

## 2021-02-16 VITALS — BP 126/74 | HR 64 | Temp 98.2°F | Ht 64.0 in | Wt 214.1 lb

## 2021-02-16 DIAGNOSIS — R21 Rash and other nonspecific skin eruption: Secondary | ICD-10-CM

## 2021-02-16 DIAGNOSIS — M7989 Other specified soft tissue disorders: Secondary | ICD-10-CM | POA: Diagnosis not present

## 2021-02-16 MED ORDER — TRIAMCINOLONE ACETONIDE 0.1 % EX CREA: 1.0000 "application " | TOPICAL_CREAM | Freq: Two times a day (BID) | CUTANEOUS | 0 refills | Status: DC

## 2021-02-16 NOTE — Patient Instructions (Signed)
It was great to see you!  Please call [phone] -- to schedule appointment with Dr. Nevada Crane  Apply topical steroid to affected area 1-2 times daily  Follow-up with me in 2-4 weeks, sooner if concerns  Try to keep legs elevated, avoid salt  Take care,  Inda Coke PA-C

## 2021-02-16 NOTE — Progress Notes (Addendum)
Enma Maeda Grillo is a 70 y.o. female here for a rash on leg.  History of Present Illness:   Chief Complaint  Patient presents with   Rash    Rash on left leg, recurrent     Knee Pain    Right knee pain that started 5 days ago Has been elevating it, doing exercises with PT for it     HPI  Rash Pattye presents with c/o rash on her right calf that has been present for 2 and a half years. Upon initially noticing this rash, Annesha had this evaluated and was told it was psoriasis. Following this initial visit, pt decided to not worry about the rash since it wasn't causing her any pain or problems. Due to recently noticing the rash has changed slightly she decided to have this re-evaluated. She has used Aquaphor to keep the area moisturized which has benefited her.    LE swelling Upon further discussion [place] admitted that she has noticed that her legs become swollen at times but soon return to normal. This has been occurring for about a year and doesn't change upon having legs elevated.  Ms. Shuffler also noticed what appeared to be a rash beginning on her right calf that is similar to the one on her left calf, but it hasn't developed. Denies pain, numbness/tingling, drainage, itchiness, chest pain, SOB,or spreading of rash.    Past Medical History:  Diagnosis Date   Bipolar 1 disorder ([place])    Breast cancer ([place])    Chronic diarrhea    loose stools twice a day on average for years   Chronic kidney disease (CKD), stage III (moderate) ([place])    Depression 1987   Hospitalization or health care facility admission within last 6 months 04/2019   for fall/seizures   Malignant neoplasm of overlapping sites of left breast in female, estrogen receptor positive ([place]) 03/14/2016   Dx in 09/2014, s/p bilateral mastectomies and ALND, 0/10 LN. 1.4 cm  Grade I invasive lobular, ER and PR +/Her--, Ki 67 <5% Tried anastrozole for one month, but developed suicidal idea   Osteoarthritis, knee 09/24/2019   Xray  09/2019   Parkinson's disease ([place]) 2012   Psoriatic arthritis ([place])    PTSD (post-traumatic stress disorder)    Secondary hyperparathyroidism ([place])    Tardive dyskinesia      Social History   Tobacco Use   Smoking status: Never   Smokeless tobacco: Never  Vaping Use   Vaping Use: Never used  Substance Use Topics   Alcohol use: Never   Drug use: Never    Past Surgical History:  Procedure Laterality Date   ABDOMINAL HYSTERECTOMY  1987   CHOLECYSTECTOMY  1979   DILATION AND CURETTAGE OF UTERUS  1973   MASTECTOMY Bilateral 09/19/2014   TONSILLECTOMY  1970   URETERAL REIMPLANTION Bilateral 1974    Family History  Problem Relation Age of Onset   Cancer Mother    Cancer Sister    Stroke Maternal Grandfather    Diabetes Paternal Grandfather    Cancer Sister     Allergies  Allergen Reactions   Aripiprazole Other (See Comments)    Parkinsonism      Lactose Intolerance (Gi) Diarrhea   Methotrexate Other (See Comments)    Hair loss, severe stomatitis     Cefdinir Diarrhea    Other reaction(s): Diarrhea Yeast infection and fever; negative c diff   Etanercept Other (See Comments)    Headaches     Exemestane Other (  See Comments)    Suicidal thoughts with medication     Fluoxetine Other (See Comments)    Parkinsonism   Lactose     Other reaction(s): diarrhea Other reaction(s): diarrhea Other reaction(s): diarrhea   Methylprednisolone Sodium Succ Other (See Comments)    Agitated mania   Epinephrine Palpitations    tachycardia    Nitrofurantoin Nausea And Vomiting and Rash      Other reaction(s): rash, diarrhea Other reaction(s): rash, diarrhea Other reaction(s): rash, diarrhea Other reaction(s): rash, diarrhea    Current Medications:   Current Outpatient Medications:    amLODipine (NORVASC) 5 MG tablet, Take 5 mg by mouth daily., Disp: , Rfl:    Certolizumab Pegol (CIMZIA) 2 X 200 MG KIT, See admin instructions., Disp: , Rfl:    cholecalciferol  (VITAMIN D3) 25 MCG (1000 UNIT) tablet, Take 1,000 Units by mouth daily., Disp: , Rfl:    diphenhydrAMINE (BENADRYL) 25 MG tablet, , Disp: , Rfl:    lamoTRIgine (LAMICTAL) 200 MG tablet, Take 1 tablet (200 mg total) by mouth at bedtime., Disp: 90 tablet, Rfl: 3   levothyroxine (SYNTHROID) 112 MCG tablet, TAKE 1 TABLET(112 MCG) BY MOUTH DAILY AT 6 AM, Disp: 30 tablet, Rfl: 0   lithium carbonate 300 MG capsule, TAKE 1 CAPSULE(300 MG) BY MOUTH AT BEDTIME, Disp: 90 capsule, Rfl: 3   LORazepam (ATIVAN) 1 MG tablet, TAKE 1 TABLET(1 MG) BY MOUTH TWICE DAILY AS NEEDED, Disp: 60 tablet, Rfl: 2   metroNIDAZOLE (METROGEL) 0.75 % gel, Apply to face 1-2 times daily, Disp: 45 g, Rfl: 0   prazosin (MINIPRESS) 5 MG capsule, Take 1 capsule (5 mg total) by mouth at bedtime., Disp: 90 capsule, Rfl: 3   sodium bicarbonate 650 MG tablet, Take 650 mg by mouth daily., Disp: , Rfl:    Review of Systems:   ROS Negative unless otherwise specified per HPI. Vitals:   Vitals:   02/16/21 1403  BP: 126/74  Pulse: 64  Temp: 98.2 F (36.8 C)  TempSrc: Temporal  SpO2: 99%  Weight: 214 lb 2 oz (97.1 kg)  Height: _0  (1.626 m)     Body mass index is 36.75 kg/m.  Physical Exam:   Physical Exam Vitals and nursing note reviewed.  Constitutional:      General: She is not in acute distress.    Appearance: She is well-developed. She is not ill-appearing or toxic-appearing.  Cardiovascular:     Rate and Rhythm: Normal rate and regular rhythm.     Pulses: Normal pulses.     Heart sounds: Normal heart sounds, S1 normal and S2 normal.  Pulmonary:     Effort: Pulmonary effort is normal.     Breath sounds: Normal breath sounds.  Musculoskeletal:     Right lower leg: 2+ Edema present.     Left lower leg: 2+ Edema present.     Comments: No calf tenderness/redness/warmth  Skin:    General: Skin is warm and dry.     Findings: Rash present.     Comments: Erythematous plaque to left distal anterior leg without TTP or  discharge  Neurological:     Mental Status: She is alert.     GCS: GCS eye subscore is 4. GCS verbal subscore is 5. GCS motor subscore is 6.  Psychiatric:        Speech: Speech normal.        Behavior: Behavior normal. Behavior is cooperative.    Assessment and Plan:   Rash No red flags;  suspect possible psoriasis or dermatitis Apply triamcinolone cream to affected area 1-2 times daily Recommended follow up to Dr. Nevada Crane, dermatology  -- phone number provided  LE edema No red flags Encouraged patient to keep legs elevated and avoid salt  Consider changing norvasc at follow-up if ongoing to r/o side effect of medication Follow up in 2-4 weeks, sooner if concerns occur  I,Havlyn C Ratchford,acting as a scribe for [employer], PA.,have documented all relevant documentation on the behalf of Inda Coke, PA,as directed by  Inda Coke, PA while in the presence of Inda Coke, [place].  I, Inda Coke, [place], have reviewed all documentation for this visit. The documentation on 02/16/21 for the exam, diagnosis, procedures, and orders are all accurate and complete.   Inda Coke, PA-C

## 2021-02-19 ENCOUNTER — Encounter (HOSPITAL_BASED_OUTPATIENT_CLINIC_OR_DEPARTMENT_OTHER): Payer: Self-pay | Admitting: Physical Therapy

## 2021-02-19 ENCOUNTER — Ambulatory Visit (HOSPITAL_BASED_OUTPATIENT_CLINIC_OR_DEPARTMENT_OTHER): Payer: Medicare Other | Admitting: Physical Therapy

## 2021-02-19 ENCOUNTER — Other Ambulatory Visit: Payer: Self-pay

## 2021-02-19 DIAGNOSIS — M6281 Muscle weakness (generalized): Secondary | ICD-10-CM

## 2021-02-19 DIAGNOSIS — R2689 Other abnormalities of gait and mobility: Secondary | ICD-10-CM | POA: Diagnosis not present

## 2021-02-19 DIAGNOSIS — R2681 Unsteadiness on feet: Secondary | ICD-10-CM

## 2021-02-19 NOTE — Therapy (Signed)
[facility] [address] [place], [name], [address] Phone: [phone]   Fax:  [phone]  Physical Therapy Treatment  Patient Details  Name: Graciana Sessa MRN: [id number] Date of Birth: [birth year] Referring Provider (PT): Inda Coke, [place]   Encounter Date: 02/19/2021   PT End of Session - 02/19/21 1046     Visit Number 6    Number of Visits 15    Date for PT Re-Evaluation 02/26/21    Authorization Type Medicare    PT Start Time 5852    PT Stop Time 1059    PT Time Calculation (min) 44 min    Activity Tolerance Patient tolerated treatment well    Behavior During Therapy WFL for tasks assessed/performed             Past Medical History:  Diagnosis Date   Bipolar 1 disorder ([place])    Breast cancer ([place])    Chronic diarrhea    loose stools twice a day on average for years   Chronic kidney disease (CKD), stage III (moderate) ([place])    Depression 1987   Hospitalization or health care facility admission within last 6 months 04/2019   for fall/seizures   Malignant neoplasm of overlapping sites of left breast in female, estrogen receptor positive ([place]) 03/14/2016   Dx in 09/2014, s/p bilateral mastectomies and ALND, 0/10 LN. 1.4 cm  Grade I invasive lobular, ER and PR +/Her--, Ki 67 <5% Tried anastrozole for one month, but developed suicidal idea   Osteoarthritis, knee 09/24/2019   Xray 09/2019   Parkinson's disease ([place]) 2012   Psoriatic arthritis ([place])    PTSD (post-traumatic stress disorder)    Secondary hyperparathyroidism ([place])    Tardive dyskinesia     Past Surgical History:  Procedure Laterality Date   [place] Bilateral 09/19/2014   TONSILLECTOMY  1970   URETERAL REIMPLANTION Bilateral 1974    There were no vitals filed for this visit.   Subjective Assessment - 02/19/21 1023     Subjective Patient  continues to have issues with her right knee. She feels like it is unstable. She has felt likethe knee has been buckling on her. She has been using the walker more in the house.    Limitations Standing;Walking;House hold activities    How long can you sit comfortably? no issues    How long can you walk comfortably? has to use a device for limited distances    Patient Stated Goals Getting stronger, improving balance, pain free    Currently in Pain? No/denies   Pain can reach a high level   Multiple Pain Sites No                  [place] Therapy: taping: McConnel taping to the right knee. Reviewed self bracing. Reviewed types of braces and benefits of each   Exercises: quad sets 2x15  SLR 3x5 each leg Supine clamshell green 2x15   Standing heel raise with rock back and balance catch 2x10 CGA with gait belt   Narrow base eyes open and closed 3x30 sec each min a with eyes closed                      PT Education - 02/19/21 1045     Education Details reviewed braces and beneftis and risks of taping    Person(s) Educated  Patient;Spouse    Methods Explanation;Demonstration;Tactile cues;Verbal cues    Comprehension Verbalized understanding;Returned demonstration;Verbal cues required;Tactile cues required              PT Short Term Goals - 02/13/21 0639       PT SHORT TERM GOAL #1   Title Pt will improve tug with rollater below 14 secs to show improvement in ambulation.    Baseline 22 seconds (from previous episode)    Time 3    Period Weeks    Status On-going      PT SHORT TERM GOAL #2   Title Patient will improve strength for bilateral hip flexion to 5/5.    Baseline 4+/5 ( improved from previosu episode    Time 3    Period Weeks    Status On-going      PT SHORT TERM GOAL #3   Title Patient will performed 5x sit to stand below 10  secs to show improvement is functional strenght.    Time 4    Period Weeks    Status On-going    Target Date  10/04/20      PT SHORT TERM GOAL #4   Title Pt will improve 5x sit<>stand to less than or equal to 18 seconds for improved functional strength.    Baseline 16.93 sec 06/30/2019    Time 4    Period Weeks    Status Achieved               PT Long Term Goals - 01/15/21 1409       PT LONG TERM GOAL #1   Title Pt will be be able to perform six minute walk test and ambulate 700 feet without a device or with a less restricted device such as a cane independently in order to ambualte in the community    Time 8    Period Weeks    Status On-going      PT LONG TERM GOAL #2   Title Pt will go up and down 6 steps without increased pain    Baseline 3 steps    Time 6    Period Weeks    Status New      PT LONG TERM GOAL #3   Title Patient will be independent with pool program and general strengthening program in order to continue to progress mobility    Time 6    Period Weeks    Status On-going                   Plan - 02/19/21 1046     Clinical Impression Statement Patient feels like her knee cap has been popping and moving around. She did have some more movement in her knee cap which overal is a good thing. Therapy trialed patella stabilization taping. She will see how it feels over the next few days. She was advised on adverse responses to tape. She was also intrested in a brace. Therapy reviewed the different types of bracing and how each can benefit her. Therapy also added balance exercises. She was advised she will be given balance exercises for home next visit.    Personal Factors and Comorbidities Fitness;Time since onset of injury/illness/exacerbation;Comorbidity 3+    Comorbidities multiple pain locations, back, hip, knee, Decreased mobility post covid and hospitalization last year,    Examination-Activity Limitations Stand;Locomotion Level;Bend;Dressing;Squat;Stairs    Examination-Participation Restrictions Cleaning;Meal Prep;Yard Work;Community Activity;Shop;Laundry     Stability/Clinical Decision Making Evolving/Moderate complexity    Clinical Decision  Making Moderate    Rehab Potential Good    PT Frequency 2x / week    PT Duration 6 weeks    PT Treatment/Interventions ADLs/Self Care Home Management;Cryotherapy;Electrical Stimulation;Ultrasound;Traction;Moist Heat;Iontophoresis 4mg /ml Dexamethasone;DME Instruction;Gait training;Stair training;Functional mobility training;Therapeutic activities;Therapeutic exercise;Orthotic Fit/Training;Patient/family education;Neuromuscular re-education;Balance training;Manual techniques;Taping;Dry needling;Passive range of motion;Spinal Manipulations;Joint Manipulations;Aquatic Therapy;Vasopneumatic Device    PT Next Visit Plan begin porgresisve strengthening in the pool. Be aware of mobility in and out of the pool. Work on balance exercises. Genral strengthening of the lower extremitys;    PT Home Exercise Plan Access Code: [facility]  URL: [email or web address]  Date: 09/06/2020  Prepared by: Carolyne Littles    Exercises  Seated Knee Extension with Resistance - 1 x daily - 7 x weekly - 3 sets - 10 reps  Seated March with Resistance - 1 x daily - 7 x weekly - 3 sets - 10 reps  Seated Hip Abduction with Resistance - 1 x daily - 7 x weekly - 3 sets - 10 reps    Consulted and Agree with Plan of Care Patient             Patient will benefit from skilled therapeutic intervention in order to improve the following deficits and impairments:  Abnormal gait, Pain, Increased muscle spasms, Decreased activity tolerance, Decreased endurance, Decreased range of motion, Decreased strength, Impaired flexibility, Difficulty walking, Decreased balance, Decreased safety awareness  Visit Diagnosis: Other abnormalities of gait and mobility  Unsteadiness on feet  Muscle weakness (generalized)     Problem List Patient Active Problem List   Diagnosis Date Noted   Osteoarthritis, knee 09/24/2019   CKD (chronic kidney disease)  stage 4, GFR 15-29 ml/min ([place]) 09/24/2019   Severe recurrent major depression without psychotic features ([place]) 09/09/2019   Bipolar I disorder, most recent episode depressed ([place])    Transaminitis    Essential hypertension    Encephalopathy 04/26/2019   Leukocytosis 04/21/2019   Thrombocytopenia ([place]) 09/04/2018   Vitamin D deficiency 09/04/2018   Psoriatic arthritis ([place]) 09/04/2018   Tubular adenoma of colon 05/28/2017   History of colonic polyps 05/28/2017   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 09/12/2016   Malignant neoplasm of overlapping sites of left breast in female, estrogen receptor positive ([place]) 03/14/2016   Chronic kidney disease, stage III (moderate) 01/19/2016   Tardive dyskinesia 10/18/2015   History of breast cancer left 2016 12/27/2014   Osteopenia determined by x-ray 10/31/2014   Rosacea 05/21/2007   Bipolar affective disorder, mixed ([place]) 08/16/2004   Acquired hypothyroidism 04/03/1996    Carney Living, PT 02/19/2021, 3:43 PM  [facility] [address] [place], [name], [address] Phone: [phone]   Fax:  [phone]  Name: Jamesha Ellsworth MRN: [id number] Date of Birth: 22-Mar-1950

## 2021-02-21 ENCOUNTER — Ambulatory Visit (INDEPENDENT_AMBULATORY_CARE_PROVIDER_SITE_OTHER): Payer: Medicare Other | Admitting: Psychiatry

## 2021-02-21 ENCOUNTER — Other Ambulatory Visit: Payer: Self-pay

## 2021-02-21 ENCOUNTER — Encounter (HOSPITAL_BASED_OUTPATIENT_CLINIC_OR_DEPARTMENT_OTHER): Payer: Self-pay | Admitting: Physical Therapy

## 2021-02-21 ENCOUNTER — Ambulatory Visit (HOSPITAL_BASED_OUTPATIENT_CLINIC_OR_DEPARTMENT_OTHER): Payer: Medicare Other | Admitting: Physical Therapy

## 2021-02-21 DIAGNOSIS — N184 Chronic kidney disease, stage 4 (severe): Secondary | ICD-10-CM

## 2021-02-21 DIAGNOSIS — M6281 Muscle weakness (generalized): Secondary | ICD-10-CM

## 2021-02-21 DIAGNOSIS — R2689 Other abnormalities of gait and mobility: Secondary | ICD-10-CM

## 2021-02-21 DIAGNOSIS — F4310 Post-traumatic stress disorder, unspecified: Secondary | ICD-10-CM | POA: Diagnosis not present

## 2021-02-21 DIAGNOSIS — Z8669 Personal history of other diseases of the nervous system and sense organs: Secondary | ICD-10-CM

## 2021-02-21 DIAGNOSIS — F3132 Bipolar disorder, current episode depressed, moderate: Secondary | ICD-10-CM | POA: Diagnosis not present

## 2021-02-21 DIAGNOSIS — F5105 Insomnia due to other mental disorder: Secondary | ICD-10-CM | POA: Diagnosis not present

## 2021-02-21 DIAGNOSIS — M25652 Stiffness of left hip, not elsewhere classified: Secondary | ICD-10-CM

## 2021-02-21 DIAGNOSIS — M25552 Pain in left hip: Secondary | ICD-10-CM

## 2021-02-21 DIAGNOSIS — R41840 Attention and concentration deficit: Secondary | ICD-10-CM

## 2021-02-21 DIAGNOSIS — F99 Mental disorder, not otherwise specified: Secondary | ICD-10-CM

## 2021-02-21 DIAGNOSIS — F411 Generalized anxiety disorder: Secondary | ICD-10-CM

## 2021-02-21 DIAGNOSIS — R2681 Unsteadiness on feet: Secondary | ICD-10-CM

## 2021-02-21 DIAGNOSIS — U099 Post covid-19 condition, unspecified: Secondary | ICD-10-CM

## 2021-02-21 NOTE — Progress Notes (Incomplete)
Psychotherapy Progress Note [facility] Tiffany Moore, PhD LP  Patient ID: Tiffany [name])    MRN: [id number] Therapy format: Individual psychotherapy Date: 02/21/2021      Start: 2:10p     Stop: ***:***     Time Spent: *** min Location: In-person   Session narrative (presenting needs, interim history, self-report of stressors and symptoms, applications of prior therapy, status changes, and interventions made in session) Again got in a cancelled spot.  Continues work lately rehabbing knee injury.  One agenda to deal with empathic stress hearing from a favorite [place] who used to dump about her life, then started complaining about her work and her employer, threatened to quit without notice, basically fired up sympathies; then on return a couple days later, saw her still working there, went up to her and told her she can't trust the girl any more.  Discussed excessive reaction, interpreted as amygdala's pattern recogntiion for manipulation and engaging survival response to prevent the next one.    Therapeutic modalities: {AM:23362::"Cognitive Behavioral Therapy","Solution-Oriented/Positive Psychology"}  Mental Status/Observations:  Appearance:   {PSY:22683}     Behavior:  {PSY:21022743}  Motor:  {PSY:22302}  Speech/Language:   {PSY:22685}  Affect:  {PSY:22687}  Mood:  {PSY:31886}  Thought process:  {PSY:31888}  Thought content:    {[phone]}  Sensory/Perceptual disturbances:    {[phone]}  Orientation:  {Psych Orientation:23301::"Fully oriented"}  Attention:  {Good-Fair-Poor ratings:23770::"Good"}    Concentration:  {Good-Fair-Poor ratings:23770::"Good"}  Memory:  {PSY:[phone]}  Insight:    {Good-Fair-Poor ratings:23770::"Good"}  Judgment:   {Good-Fair-Poor ratings:23770::"Good"}  Impulse Control:  {Good-Fair-Poor ratings:23770::"Good"}   Risk Assessment: Danger to Self: {Risk:22599::"No"} Self-injurious Behavior:  {Risk:22599::"No"} Danger to Others: {Risk:22599::"No"} Physical Aggression / Violence: {Risk:22599::"No"} Duty to Warn: {AMYesNo:22526::"No"} Access to Firearms a concern: {AMYesNo:22526::"No"}  Assessment of progress:  {Progress:22147::"progressing"}  Diagnosis: No diagnosis found. Plan:  *** Other recommendations/advice as may be noted above Continue to utilize previously learned skills ad lib Maintain medication as prescribed and work faithfully with relevant prescriber(s) if any changes are desired or seem indicated Call the clinic on-call service, 988/hotline, 911, or present to [facility] or ER if any life-threatening psychiatric crisis No follow-ups on file. Already scheduled visit in this office 03/16/2021.  [name], PhD Tiffany Moore, PhD LP Clinical Psychologist, [facility] [address], [place], [place] [address] [phone]

## 2021-02-22 ENCOUNTER — Encounter (HOSPITAL_BASED_OUTPATIENT_CLINIC_OR_DEPARTMENT_OTHER): Payer: Self-pay | Admitting: Physical Therapy

## 2021-02-22 NOTE — Therapy (Signed)
[place], [name], [address] Phone: [phone]   Fax:  [phone]  Physical Therapy Treatment  Patient Details  Name: Tiffany [name] MRN: [id number] Date of Birth: 1950-05-15 Referring Provider (PT): Inda Coke, [place]   Encounter Date: 02/21/2021   PT End of Session - 02/21/21 1120     Visit Number 7    Number of Visits 15    Date for PT Re-Evaluation 02/26/21    Authorization Type Medicare    PT Start Time 9242    PT Stop Time 1057    PT Time Calculation (min) 42 min    Activity Tolerance Patient tolerated treatment well    Behavior During Therapy WFL for tasks assessed/performed             Past Medical History:  Diagnosis Date   Bipolar 1 disorder ([place])    Breast cancer ([place])    Chronic diarrhea    loose stools twice a day on average for years   Chronic kidney disease (CKD), stage III (moderate) ([place])    Depression 1987   Hospitalization or health care facility admission within last 6 months 04/2019   for fall/seizures   Malignant neoplasm of overlapping sites of left breast in female, estrogen receptor positive ([place]) 03/14/2016   Dx in 09/2014, s/p bilateral mastectomies and ALND, 0/10 LN. 1.4 cm  Grade I invasive lobular, ER and PR +/Her--, Ki 67 <5% Tried anastrozole for one month, but developed suicidal idea   Osteoarthritis, knee 09/24/2019   Xray 09/2019   Parkinson's disease ([place]) 2012   Psoriatic arthritis ([place])    PTSD (post-traumatic stress disorder)    Secondary hyperparathyroidism ([place])    Tardive dyskinesia     Past Surgical History:  Procedure Laterality Date   [place] Bilateral 09/19/2014   TONSILLECTOMY  1970   URETERAL REIMPLANTION Bilateral 1974    There were no vitals filed for this visit.   Subjective Assessment - 02/21/21 1035     Subjective Patient  reports her knee is feeling better. She has been working on her exercises and stretches over the past few days.    Limitations Standing;Walking;House hold activities    How long can you sit comfortably? no issues    How long can you stand comfortably? Could be immediate or an hour. For sure after an hour.    How long can you walk comfortably? has to use a device for limited distances    Patient Stated Goals Getting stronger, improving balance, pain free    Currently in Pain? Yes    Pain Score 3     Pain Location Knee    Pain Orientation Left    Pain Descriptors / Indicators Aching    Pain Type Chronic pain    Pain Onset More than a month ago    Pain Frequency Intermittent    Aggravating Factors  pain in standing    Pain Relieving Factors light strethcing and movement    Multiple Pain Sites No               Manual Therapy: grade II and II PA and AP mobilization to increase knee extension.    Exercises: quad sets 2x15  SLR 3x5 each leg Supine clamshell green 2x15    Standing heel raise with rock back and balance catch 2x10 CGA with gait belt  Narrow base eyes open and closed 3x30 sec each min a with eyes closed   Normal stance on air-ex 2x20 sec   Narrow base on air-ex 2x20 sec   Low march on the sink with UE support 2x10 no increase in knee pain                              PT Education - 02/21/21 1052     Education Details reviewed progresison of balance exercises    Person(s) Educated Patient;Spouse;Child(ren);Parent(s)    Methods Explanation;Demonstration;Tactile cues;Verbal cues    Comprehension Verbalized understanding;Returned demonstration;Verbal cues required;Tactile cues required              PT Short Term Goals - 02/13/21 0639       PT SHORT TERM GOAL #1   Title Pt will improve tug with rollater below 14 secs to show improvement in ambulation.    Baseline 22 seconds (from previous episode)    Time 3    Period Weeks     Status On-going      PT SHORT TERM GOAL #2   Title Patient will improve strength for bilateral hip flexion to 5/5.    Baseline 4+/5 ( improved from previosu episode    Time 3    Period Weeks    Status On-going      PT SHORT TERM GOAL #3   Title Patient will performed 5x sit to stand below 10  secs to show improvement is functional strenght.    Time 4    Period Weeks    Status On-going    Target Date 10/04/20      PT SHORT TERM GOAL #4   Title Pt will improve 5x sit<>stand to less than or equal to 18 seconds for improved functional strength.    Baseline 16.93 sec 06/30/2019    Time 4    Period Weeks    Status Achieved               PT Long Term Goals - 01/15/21 1409       PT LONG TERM GOAL #1   Title Pt will be be able to perform six minute walk test and ambulate 700 feet without a device or with a less restricted device such as a cane independently in order to ambualte in the community    Time 8    Period Weeks    Status On-going      PT LONG TERM GOAL #2   Title Pt will go up and down 6 steps without increased pain    Baseline 3 steps    Time 6    Period Weeks    Status New      PT LONG TERM GOAL #3   Title Patient will be independent with pool program and general strengthening program in order to continue to progress mobility    Time 6    Period Weeks    Status On-going                   Plan - 02/21/21 1121     Clinical Impression Statement Therapy was ake to advance balance exercises to the air-ex mat today. She had no significant increase in pain. Therapy perfroemd manual therapy for her knee. Her extension appears to be improving per visual inpsection. Therapy will continue to advance balance and knee strengthening per protocol.    Personal Factors and Comorbidities Fitness;Time since  onset of injury/illness/exacerbation;Comorbidity 3+    Comorbidities multiple pain locations, back, hip, knee, Decreased mobility post covid and hospitalization  last year,    Examination-Activity Limitations Stand;Locomotion Level;Bend;Dressing;Squat;Stairs    Examination-Participation Restrictions Cleaning;Meal Prep;Yard Work;Community Activity;Shop;Laundry    Stability/Clinical Decision Making Evolving/Moderate complexity    Clinical Decision Making Moderate    Rehab Potential Good    PT Frequency 2x / week    PT Duration 6 weeks    PT Treatment/Interventions ADLs/Self Care Home Management;Cryotherapy;Electrical Stimulation;Ultrasound;Traction;Moist Heat;Iontophoresis 4mg /ml Dexamethasone;DME Instruction;Gait training;Stair training;Functional mobility training;Therapeutic activities;Therapeutic exercise;Orthotic Fit/Training;Patient/family education;Neuromuscular re-education;Balance training;Manual techniques;Taping;Dry needling;Passive range of motion;Spinal Manipulations;Joint Manipulations;Aquatic Therapy;Vasopneumatic Device    PT Next Visit Plan begin porgresisve strengthening in the pool. Be aware of mobility in and out of the pool. Work on balance exercises. Genral strengthening of the lower extremitys;    PT Home Exercise Plan Access Code: [facility] [place]  URL: [email or web address]  Date: 09/06/2020  Prepared by: Carolyne Littles    Exercises  Seated Knee Extension with Resistance - 1 x daily - 7 x weekly - 3 sets - 10 reps  Seated March with Resistance - 1 x daily - 7 x weekly - 3 sets - 10 reps  Seated Hip Abduction with Resistance - 1 x daily - 7 x weekly - 3 sets - 10 reps    Consulted and Agree with Plan of Care Patient             Patient will benefit from skilled therapeutic intervention in order to improve the following deficits and impairments:  Abnormal gait, Pain, Increased muscle spasms, Decreased activity tolerance, Decreased endurance, Decreased range of motion, Decreased strength, Impaired flexibility, Difficulty walking, Decreased balance, Decreased safety awareness  Visit Diagnosis: Other abnormalities of gait and  mobility  Unsteadiness on feet  Muscle weakness (generalized)  Pain in left hip  Stiffness of left hip, not elsewhere classified     Problem List Patient Active Problem List   Diagnosis Date Noted   Osteoarthritis, knee 09/24/2019   CKD (chronic kidney disease) stage 4, GFR 15-29 ml/min ([place]) 09/24/2019   Severe recurrent major depression without psychotic features ([place]) 09/09/2019   Bipolar I disorder, most recent episode depressed (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 09/12/2016   Malignant neoplasm of overlapping sites of left breast in female, estrogen receptor positive ([place]) 03/14/2016   Chronic kidney disease, stage III (moderate) 01/19/2016   Tardive dyskinesia 10/18/2015   History of breast cancer left 2016 12/27/2014   Osteopenia determined by x-ray 10/31/2014   Rosacea 05/21/2007   Bipolar affective disorder, mixed ([place]) 08/16/2004   Acquired hypothyroidism 04/03/1996    Carney Living, PT 02/22/2021, 8:50 AM  [facility] [address] [place], [name], [address] Phone: [phone]   Fax:  [phone]  Name: Micca Matura MRN: [id number] Date of Birth: 1950-08-09

## 2021-02-24 ENCOUNTER — Other Ambulatory Visit: Payer: Self-pay | Admitting: Physician Assistant

## 2021-02-26 ENCOUNTER — Encounter (HOSPITAL_BASED_OUTPATIENT_CLINIC_OR_DEPARTMENT_OTHER): Payer: Self-pay | Admitting: Physical Therapy

## 2021-02-26 ENCOUNTER — Other Ambulatory Visit: Payer: Self-pay

## 2021-02-26 ENCOUNTER — Ambulatory Visit (HOSPITAL_BASED_OUTPATIENT_CLINIC_OR_DEPARTMENT_OTHER): Payer: Medicare Other | Admitting: Physical Therapy

## 2021-02-26 DIAGNOSIS — R2689 Other abnormalities of gait and mobility: Secondary | ICD-10-CM | POA: Diagnosis not present

## 2021-02-26 DIAGNOSIS — R2681 Unsteadiness on feet: Secondary | ICD-10-CM

## 2021-02-26 DIAGNOSIS — M6281 Muscle weakness (generalized): Secondary | ICD-10-CM

## 2021-02-26 NOTE — Therapy (Signed)
[name] [address] [place], [name], [address] Phone: [phone]   Fax:  [phone]  Physical Therapy Treatment  Patient Details  Name: Tiffany [name] MRN: [id number] Date of Birth: 1950/06/16 Referring Provider (PT): Inda Coke, [place]   Encounter Date: 02/26/2021   PT End of Session - 02/26/21 1022     Visit Number 8    Number of Visits 15    Date for PT Re-Evaluation 02/26/21    Authorization Type Medicare    PT Start Time 1017    PT Stop Time 1100    PT Time Calculation (min) 43 min    Activity Tolerance Patient tolerated treatment well    Behavior During Therapy WFL for tasks assessed/performed             Past Medical History:  Diagnosis Date   Bipolar 1 disorder ([place])    Breast cancer ([place])    Chronic diarrhea    loose stools twice a day on average for years   Chronic kidney disease (CKD), stage III (moderate) ([place])    Depression 1987   Hospitalization or health care facility admission within last 6 months 04/2019   for fall/seizures   Malignant neoplasm of overlapping sites of left breast in female, estrogen receptor positive ([place]) 03/14/2016   Dx in 09/2014, s/p bilateral mastectomies and ALND, 0/10 LN. 1.4 cm  Grade I invasive lobular, ER and PR +/Her--, Ki 67 <5% Tried anastrozole for one month, but developed suicidal idea   Osteoarthritis, knee 09/24/2019   Xray 09/2019   Parkinson's disease ([place]) 2012   Psoriatic arthritis ([place])    PTSD (post-traumatic stress disorder)    Secondary hyperparathyroidism ([place])    Tardive dyskinesia     Past Surgical History:  Procedure Laterality Date   [place] Bilateral 09/19/2014   TONSILLECTOMY  1970   URETERAL REIMPLANTION Bilateral 1974    There were no vitals filed for this visit.   Subjective Assessment - 02/26/21 1021     Subjective Patient  reports her knee isn't hurting her at all today. She reports it has been feeling much better. She was sick over the weekend.    Limitations Standing;Walking;House hold activities    How long can you stand comfortably? Could be immediate or an hour. For sure after an hour.    How long can you walk comfortably? has to use a device for limited distances    Patient Stated Goals Getting stronger, improving balance, pain free    Currently in Pain? No/denies                 Exercises: quad sets 2x15  SLR 3x5 each leg Supine clamshell green 2x15    Standing heel raise with rock back and balance catch 2x10 CGA with gait belt    Narrow base eyes open and closed 3x30 sec each min a with eyes closed    Normal stance on air-ex 2x20 sec    Narrow base on air-ex 2x20 sec   Tandem stance on air-ex    Low march on the sink without UE support 2x10 no increase in knee pain                          PT Education - 02/26/21 1022     Education Details reviewed HEP and symptom mangement  Person(s) Educated Patient    Methods Explanation;Demonstration;Verbal cues;Tactile cues    Comprehension Verbalized understanding;Returned demonstration;Verbal cues required;Tactile cues required              PT Short Term Goals - 02/13/21 0639       PT SHORT TERM GOAL #1   Title Pt will improve tug with rollater below 14 secs to show improvement in ambulation.    Baseline 22 seconds (from previous episode)    Time 3    Period Weeks    Status On-going      PT SHORT TERM GOAL #2   Title Patient will improve strength for bilateral hip flexion to 5/5.    Baseline 4+/5 ( improved from previosu episode    Time 3    Period Weeks    Status On-going      PT SHORT TERM GOAL #3   Title Patient will performed 5x sit to stand below 10  secs to show improvement is functional strenght.    Time 4    Period Weeks    Status On-going    Target Date 10/04/20      PT SHORT TERM GOAL  #4   Title Pt will improve 5x sit<>stand to less than or equal to 18 seconds for improved functional strength.    Baseline 16.93 sec 06/30/2019    Time 4    Period Weeks    Status Achieved               PT Long Term Goals - 01/15/21 1409       PT LONG TERM GOAL #1   Title Pt will be be able to perform six minute walk test and ambulate 700 feet without a device or with a less restricted device such as a cane independently in order to ambualte in the community    Time 8    Period Weeks    Status On-going      PT LONG TERM GOAL #2   Title Pt will go up and down 6 steps without increased pain    Baseline 3 steps    Time 6    Period Weeks    Status New      PT LONG TERM GOAL #3   Title Patient will be independent with pool program and general strengthening program in order to continue to progress mobility    Time 6    Period Weeks    Status On-going                   Plan - 02/26/21 1138     Clinical Impression Statement Patient is making progress. Her knee was not hurting today. She trialled the exercise bike. She was tight at first but improved motion as she worked on it. Her balance on the air-ex improved. We progressed her to tandem stance on the air-ex. She also perfromed marching without UE support with CGA. She had no pain with treatment today.    Personal Factors and Comorbidities Fitness;Time since onset of injury/illness/exacerbation;Comorbidity 3+    Comorbidities multiple pain locations, back, hip, knee, Decreased mobility post covid and hospitalization last year,    Examination-Activity Limitations Stand;Locomotion Level;Bend;Dressing;Squat;Stairs    Examination-Participation Restrictions Cleaning;Meal Prep;Yard Work;Community Activity;Shop;Laundry    Stability/Clinical Decision Making Evolving/Moderate complexity    Clinical Decision Making Moderate    Rehab Potential Good    PT Frequency 2x / week    PT Duration 6 weeks    PT Treatment/Interventions  ADLs/Self  Care Home Management;Cryotherapy;Electrical Stimulation;Ultrasound;Traction;Moist Heat;Iontophoresis 4mg /ml Dexamethasone;DME Instruction;Gait training;Stair training;Functional mobility training;Therapeutic activities;Therapeutic exercise;Orthotic Fit/Training;Patient/family education;Neuromuscular re-education;Balance training;Manual techniques;Taping;Dry needling;Passive range of motion;Spinal Manipulations;Joint Manipulations;Aquatic Therapy;Vasopneumatic Device    PT Next Visit Plan begin porgresisve strengthening in the pool. Be aware of mobility in and out of the pool. Work on balance exercises. Genral strengthening of the lower extremitys;    PT Home Exercise Plan Access Code: [facility]  URL: [email or web address]  Date: 09/06/2020  Prepared by: Carolyne Littles    Exercises  Seated Knee Extension with Resistance - 1 x daily - 7 x weekly - 3 sets - 10 reps  Seated March with Resistance - 1 x daily - 7 x weekly - 3 sets - 10 reps  Seated Hip Abduction with Resistance - 1 x daily - 7 x weekly - 3 sets - 10 reps    Consulted and Agree with Plan of Care Patient             Patient will benefit from skilled therapeutic intervention in order to improve the following deficits and impairments:  Abnormal gait, Pain, Increased muscle spasms, Decreased activity tolerance, Decreased endurance, Decreased range of motion, Decreased strength, Impaired flexibility, Difficulty walking, Decreased balance, Decreased safety awareness  Visit Diagnosis: Other abnormalities of gait and mobility  Unsteadiness on feet  Muscle weakness (generalized)     Problem List Patient Active Problem List   Diagnosis Date Noted   Osteoarthritis, knee 09/24/2019   CKD (chronic kidney disease) stage 4, GFR 15-29 ml/min ([place]) 09/24/2019   Severe recurrent major depression without psychotic features (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 [address] [place], [name], [address] Phone: [phone]   Fax:  [phone]  Name: Soriah Leeman MRN: [id number] Date of Birth: 1950/06/08

## 2021-02-27 ENCOUNTER — Other Ambulatory Visit: Payer: Self-pay | Admitting: *Deleted

## 2021-02-27 ENCOUNTER — Ambulatory Visit (INDEPENDENT_AMBULATORY_CARE_PROVIDER_SITE_OTHER): Payer: Medicare Other | Admitting: Psychiatry

## 2021-02-27 DIAGNOSIS — F4010 Social phobia, unspecified: Secondary | ICD-10-CM | POA: Diagnosis not present

## 2021-02-27 DIAGNOSIS — F4310 Post-traumatic stress disorder, unspecified: Secondary | ICD-10-CM | POA: Diagnosis not present

## 2021-02-27 DIAGNOSIS — Z630 Problems in relationship with spouse or partner: Secondary | ICD-10-CM | POA: Diagnosis not present

## 2021-02-27 DIAGNOSIS — Z8669 Personal history of other diseases of the nervous system and sense organs: Secondary | ICD-10-CM

## 2021-02-27 DIAGNOSIS — F3132 Bipolar disorder, current episode depressed, moderate: Secondary | ICD-10-CM | POA: Diagnosis not present

## 2021-02-27 MED ORDER — LEVOTHYROXINE SODIUM 112 MCG PO TABS: 112.0000 ug | ORAL_TABLET | Freq: Every day | ORAL | 0 refills | Status: DC

## 2021-02-27 NOTE — Progress Notes (Signed)
Psychotherapy Progress Note [facility] [name], PhD LP  Patient ID: Tiffany [name])    MRN: [id number] Therapy format: Individual psychotherapy Date: 02/27/2021      Start: 11:12a     Stop: 12:02p     Time Spent: 50 min Location: In-person   Session narrative (presenting needs, interim history, self-report of stressors and symptoms, applications of prior therapy, status changes, and interventions made in session) Has had some intrusive memories and "flashback" experiences (anticipatory fear?) going back to the restaurant she'd be avoiding.  Tiffany [name] was not there, but another favorite server was, who disclosed that she herself had just come out of a psychiatric hospitalization.  Hearing about her sensitivity to light and sound and needing less commotion than her loud, heavy-drinking family would provide was evocative and a little triggering.  But notes she managed to stay present and get through the meal without distress.  Did observe having elevated startle response, and having adrenaline reactions to abrupt sounds, like a plate banging on another object being there.  Interpreted autonomic arousal and "safety brain" hard at work, affirmed staying present.  C/o Tiffany [name] missing emotional signals -- recently went to a sporting good store together to get basketball shorts for step-grandson, and his failure to comprehend, then invalidating comments, infuriated her enough to raise her voice, wound up slamming her rollator in the car.  Interpreted as a mix of posttraumatic self-protection against feeling invalidated (perceived, at least, that he was questioning her knowledge) and the inconvenience of having him kibitz when she meant to just do the shopping, competently, herself and he tagged along.   Processed the event, the overall pattern of autistic-like, "engineer" thinking on his part, recharacterizing him as sincerely interested in doing the right thing and helping, if  perceptually impaired.  Affirmed her instincts letting him give her a hug today and telling him good job on a piece of holiday decoration.  Resolved she does want to revisit with him now that anger has cooled, and that rather than ask him to hear critical feedback, she can ask him to receive a "do over" for how she represented herself, where she can say something more direct about not wanting to debate, just please let her do what she came for shopping the gift.  Or, ask him to notice her sore feelings in progress and break off having to settle any facts, treat it as a "yellow light" and slow down.  Therapeutic modalities: Cognitive Behavioral Therapy, Solution-Oriented/Positive Psychology, Insight-Oriented, and Assertiveness/Communication  Mental Status/Observations:  Appearance:   Casual and Neat     Behavior:  Appropriate  Motor:  rollator  Speech/Language:   Clear and Coherent  Affect:  Appropriate  Mood:  even  Thought process:  normal  Thought content:    WNL  Sensory/Perceptual disturbances:    WNL  Orientation:  Fully oriented  Attention:  Good    Concentration:  Good  Memory:  grossly intact  Insight:    Good  Judgment:   Good  Impulse Control:  Fair   Risk Assessment: Danger to Self: No Self-injurious Behavior: No Danger to Others: No Physical Aggression / Violence: No Duty to Warn: No Access to Firearms a concern: No  Assessment of progress:  progressing  Diagnosis:   ICD-10-CM   1. PTSD (post-traumatic stress disorder)  F43.10     2. Bipolar I disorder, most recent episode depressed, moderate ([place])  F31.32     3. Social anxiety disorder  F40.10  4. Relationship problem between partners  Z63.0     5. History of metabolic encephalopathy  W03.79      Plan:  Do-over with Tiffany [name] Continue PT as directed Other recommendations/advice as may be noted above Continue to utilize previously learned skills ad lib Maintain medication as prescribed and work faithfully  with relevant prescriber(s) if any changes are desired or seem indicated Call the clinic on-call service, 988/hotline, 911, or present to [facility] or ER if any life-threatening psychiatric crisis Return for session(s) already scheduled. Already scheduled visit in this office 03/16/2021.  [name], PhD [name], PhD LP Clinical Psychologist, [facility] [address] [place], [place] [address] [phone]

## 2021-02-28 ENCOUNTER — Ambulatory Visit: Payer: Medicare Other | Admitting: Psychiatry

## 2021-02-28 ENCOUNTER — Ambulatory Visit (HOSPITAL_BASED_OUTPATIENT_CLINIC_OR_DEPARTMENT_OTHER): Payer: Medicare Other | Admitting: Physical Therapy

## 2021-02-28 ENCOUNTER — Encounter (HOSPITAL_BASED_OUTPATIENT_CLINIC_OR_DEPARTMENT_OTHER): Payer: Self-pay

## 2021-03-06 ENCOUNTER — Other Ambulatory Visit: Payer: Self-pay

## 2021-03-06 ENCOUNTER — Encounter (HOSPITAL_BASED_OUTPATIENT_CLINIC_OR_DEPARTMENT_OTHER): Payer: Self-pay | Admitting: Physical Therapy

## 2021-03-06 ENCOUNTER — Ambulatory Visit (HOSPITAL_BASED_OUTPATIENT_CLINIC_OR_DEPARTMENT_OTHER): Payer: Medicare Other | Admitting: Physical Therapy

## 2021-03-06 ENCOUNTER — Telehealth: Payer: Self-pay | Admitting: Physician Assistant

## 2021-03-06 DIAGNOSIS — R2681 Unsteadiness on feet: Secondary | ICD-10-CM

## 2021-03-06 DIAGNOSIS — M6281 Muscle weakness (generalized): Secondary | ICD-10-CM

## 2021-03-06 DIAGNOSIS — R2689 Other abnormalities of gait and mobility: Secondary | ICD-10-CM | POA: Diagnosis not present

## 2021-03-06 NOTE — Telephone Encounter (Signed)
pt returned phone call. please advise.

## 2021-03-06 NOTE — Progress Notes (Signed)
°  Care Management   Follow Up Note   03/06/2021 Name: Tiffany [name] MRN: [id number] DOB: 07-23-1950   Referred by: Inda Coke, [place] Reason for referral : No chief complaint on file.   An unsuccessful telephone outreach was attempted today. The patient was referred to the case management team for assistance with care management and care coordination.   Follow Up Plan: No further follow up required:    [place]

## 2021-03-07 ENCOUNTER — Telehealth: Payer: Self-pay | Admitting: Physician Assistant

## 2021-03-07 ENCOUNTER — Encounter (HOSPITAL_BASED_OUTPATIENT_CLINIC_OR_DEPARTMENT_OTHER): Payer: Self-pay | Admitting: Physical Therapy

## 2021-03-07 NOTE — Therapy (Signed)
[facility] [address] [place], [name], [address] Phone: [phone]   Fax:  [phone]  Physical Therapy Treatment/Progress Note  Patient Details  Name: Tiffany [name] MRN: [id number] Date of Birth: 29-Sep-1950 Referring Provider (PT): Inda Coke, [place]  Progress Note Reporting Period 01/15/2021 to 03/07/2021  See note below for Objective Data and Assessment of Progress/Goals.     Encounter Date: 03/06/2021   PT End of Session - 03/06/21 1401     Visit Number 9    Number of Visits 21    Date for PT Re-Evaluation 04/17/21    Authorization Type Medicare    PT Start Time 1345    PT Stop Time 1427    PT Time Calculation (min) 42 min    Activity Tolerance Patient tolerated treatment well    Behavior During Therapy WFL for tasks assessed/performed             Past Medical History:  Diagnosis Date   Bipolar 1 disorder ([place])    Breast cancer ([place])    Chronic diarrhea    loose stools twice a day on average for years   Chronic kidney disease (CKD), stage III (moderate) ([place])    Depression 1987   Hospitalization or health care facility admission within last 6 months 04/2019   for fall/seizures   Malignant neoplasm of overlapping sites of left breast in female, estrogen receptor positive ([place]) 03/14/2016   Dx in 09/2014, s/p bilateral mastectomies and ALND, 0/10 LN. 1.4 cm  Grade I invasive lobular, ER and PR +/Her--, Ki 67 <5% Tried anastrozole for one month, but developed suicidal idea   Osteoarthritis, knee 09/24/2019   Xray 09/2019   Parkinson's disease ([place]) 2012   Psoriatic arthritis ([place])    PTSD (post-traumatic stress disorder)    Secondary hyperparathyroidism ([place])    Tardive dyskinesia     Past Surgical History:  Procedure Laterality Date   [place] Bilateral 09/19/2014   TONSILLECTOMY  1970   URETERAL  REIMPLANTION Bilateral 1974    There were no vitals filed for this visit.   Subjective Assessment - 03/07/21 0737     Subjective Patient reports her knee is doing well. She continues to have a respiratory infection that she can not get rid of.Shewas usy over the holidays but her knee felt good.    Limitations Standing;Walking;House hold activities    How long can you sit comfortably? no issues    How long can you stand comfortably? Could be immediate or an hour. For sure after an hour.    How long can you walk comfortably? has to use a device for limited distances    Patient Stated Goals Getting stronger, improving balance, pain free    Currently in Pain? No/denies                [facility] PT Assessment - 03/07/21 0001       Assessment   Medical Diagnosis M25.552 (ICD-10-CM) - Pain in left hip    Referring Provider (PT) Inda Coke, PA      AROM   Overall AROM Comments -10      Strength   Right Hip Flexion --   14.7   Right Hip ABduction --   22.7   Left Hip Flexion --   14.5   Left Hip ABduction --   21.8   Right Knee  Extension --   32.5   Left Knee Extension --   49.5     Ambulation/Gait   Ambulation Distance (Feet) 1040 Feet   6 min walk test     Timed Up and Go Test   TUG Comments 12.5 average of two trials                           [facility] Adult PT Treatment/Exercise - 03/07/21 0001       Lumbar Exercises: Supine   Other Supine Lumbar Exercises quad set 3x10 5 sec hold      Manual Therapy   Manual Therapy Joint mobilization;Soft tissue mobilization;Manual Traction;Passive ROM    Joint Mobilization grade 1 and II PA and AP mobilization of the right knee to improve extension                     PT Education - 03/07/21 0755     Education Details reviewed tests and measures and POC going forward    Person(s) Educated Patient    Methods Explanation;Demonstration;Tactile cues;Verbal cues    Comprehension Verbalized  understanding;Returned demonstration;Verbal cues required;Tactile cues required              PT Short Term Goals - 03/07/21 0835       PT SHORT TERM GOAL #1   Title Pt will improve tug with rollater below 14 secs to show improvement in ambulation.    Baseline 14 seconds 12/28    Time 3    Period Weeks    Status Achieved      PT SHORT TERM GOAL #2   Title Patient will improve strength for bilateral hip flexion to 5/5.    Baseline tested with hand dyno today    Time 3    Period Weeks    Status On-going      PT SHORT TERM GOAL #3   Title Patient will performed 5x sit to stand below 10  secs to show improvement is functional strenght.    Baseline 16 sec    Time 4    Period Weeks    Status On-going    Target Date 10/04/20      PT SHORT TERM GOAL #4   Title Pt will improve 5x sit<>stand to less than or equal to 18 seconds for improved functional strength.    Baseline 16.93 sec 06/30/2019    Time 4    Period Weeks    Status On-going      PT SHORT TERM GOAL #5   Title Pt/husband will verbalize understanding of fall prevention in home environment.    Time 4    Period Weeks    Status On-going               PT Long Term Goals - 01/15/21 1409       PT LONG TERM GOAL #1   Title Pt will be be able to perform six minute walk test and ambulate 700 feet without a device or with a less restricted device such as a cane independently in order to ambualte in the community    Time 8    Period Weeks    Status On-going      PT LONG TERM GOAL #2   Title Pt will go up and down 6 steps without increased pain    Baseline 3 steps    Time 6    Period Weeks    Status New  PT LONG TERM GOAL #3   Title Patient will be independent with pool program and general strengthening program in order to continue to progress mobility    Time 6    Period Weeks    Status On-going                   Plan - 03/06/21 1431     Clinical Impression Statement Therapy perfromed  progress note on the patient today. She increased her gait distance on the 6 minute walk test by 140 feet. She improved her TUG time by 30%. Therapy perfromed dynometric testing on the patient for her strength. Her strength L vs R is comprable. She is under her age related norms but she is close.on several of her meausrements. She has had a few instances during this episode where she has had respiratory infection. She would benefit from further skilled therapy    Personal Factors and Comorbidities Fitness;Time since onset of injury/illness/exacerbation;Comorbidity 3+    Comorbidities multiple pain locations, back, hip, knee, Decreased mobility post covid and hospitalization last year,    Examination-Activity Limitations Stand;Locomotion Level;Bend;Dressing;Squat;Stairs    Examination-Participation Restrictions Cleaning;Meal Prep;Yard Work;Community Activity;Shop;Laundry    Stability/Clinical Decision Making Evolving/Moderate complexity    Clinical Decision Making Moderate    Rehab Potential Good    PT Frequency 2x / week    PT Duration 6 weeks    PT Treatment/Interventions ADLs/Self Care Home Management;Cryotherapy;Electrical Stimulation;Ultrasound;Traction;Moist Heat;Iontophoresis 4mg /ml Dexamethasone;DME Instruction;Gait training;Stair training;Functional mobility training;Therapeutic activities;Therapeutic exercise;Orthotic Fit/Training;Patient/family education;Neuromuscular re-education;Balance training;Manual techniques;Taping;Dry needling;Passive range of motion;Spinal Manipulations;Joint Manipulations;Aquatic Therapy;Vasopneumatic Device    PT Next Visit Plan begin porgresisve strengthening in the pool. Be aware of mobility in and out of the pool. Work on balance exercises. Genral strengthening of the lower extremitys;    PT Home Exercise Plan Access Code: [facility]  URL: [email or web address]  Date: 09/06/2020  Prepared by: Carolyne Littles    Exercises  Seated Knee Extension with  Resistance - 1 x daily - 7 x weekly - 3 sets - 10 reps  Seated March with Resistance - 1 x daily - 7 x weekly - 3 sets - 10 reps  Seated Hip Abduction with Resistance - 1 x daily - 7 x weekly - 3 sets - 10 reps    Consulted and Agree with Plan of Care Patient             Patient will benefit from skilled therapeutic intervention in order to improve the following deficits and impairments:  Abnormal gait, Pain, Increased muscle spasms, Decreased activity tolerance, Decreased endurance, Decreased range of motion, Decreased strength, Impaired flexibility, Difficulty walking, Decreased balance, Decreased safety awareness  Visit Diagnosis: Other abnormalities of gait and mobility  Unsteadiness on feet  Muscle weakness (generalized)     Problem List Patient Active Problem List   Diagnosis Date Noted   Osteoarthritis, knee 09/24/2019   CKD (chronic kidney disease) stage 4, GFR 15-29 ml/min ([place]) 09/24/2019   Severe recurrent major depression without psychotic features ([place]) 09/09/2019   Bipolar I disorder, most recent episode depressed ([place])    Transaminitis    Essential hypertension    Encephalopathy 04/26/2019   Leukocytosis 04/21/2019   Thrombocytopenia ([place]) 09/04/2018   Vitamin D deficiency 09/04/2018   Psoriatic arthritis ([place]) 09/04/2018   Tubular adenoma of colon 05/28/2017   History of colonic polyps 05/28/2017   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 09/12/2016   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

## 2021-03-07 NOTE — Chronic Care Management (AMB) (Signed)
°  Care Management   Follow Up Note   03/07/2021 Name: Tiffany [name] MRN: [id number] DOB: 30-Aug-1950   Referred by: Inda Coke, [place] Reason for referral : No chief complaint on file.   An unsuccessful telephone outreach was attempted today. The patient was referred to the case management team for assistance with care management and care coordination.   Follow Up Plan:     [place]

## 2021-03-07 NOTE — Progress Notes (Signed)
°  Care Management   Follow Up Note   03/07/2021 Name: Tiffany [name] MRN: [id number] DOB: Aug 03, 1950   Referred by: Inda Coke, [place] Reason for referral : No chief complaint on file.   An unsuccessful telephone outreach was attempted today. The patient was referred to the case management team for assistance with care management and care coordination.   Follow Up Plan: No further follow up required:    [place]

## 2021-03-08 ENCOUNTER — Ambulatory Visit (HOSPITAL_BASED_OUTPATIENT_CLINIC_OR_DEPARTMENT_OTHER): Payer: Medicare Other | Admitting: Physical Therapy

## 2021-03-08 ENCOUNTER — Other Ambulatory Visit: Payer: Self-pay

## 2021-03-08 ENCOUNTER — Encounter (HOSPITAL_BASED_OUTPATIENT_CLINIC_OR_DEPARTMENT_OTHER): Payer: Self-pay | Admitting: Physical Therapy

## 2021-03-08 DIAGNOSIS — R2689 Other abnormalities of gait and mobility: Secondary | ICD-10-CM

## 2021-03-08 DIAGNOSIS — R2681 Unsteadiness on feet: Secondary | ICD-10-CM

## 2021-03-08 DIAGNOSIS — M6281 Muscle weakness (generalized): Secondary | ICD-10-CM

## 2021-03-08 NOTE — Progress Notes (Signed)
Tiffany [name] is a 70 y.o. female here for a follow up of a rash.   History of Present Illness:   Chief Complaint  Patient presents with   Rash    Left leg; topical cream is not helping. Pt may have had an allergic reaction to it. She denies pain.    Tiffany [name] presented to today's visit with her husband, Tiffany [name].   HPI  Rash Tiffany [name] returns for f/u of rash on her left leg that has been present for 2 and a half years. During our previous visit on 02/16/21, Tiffany [name] expressed that when she initially noticed the rash, she had it evaluated and was told it was psoriasis. Although this eased her mind at the time, it wasn't until the rash started changing slightly that she became concerned. At that time she reported she had been using Aquaphor to keep the area moisturized which she found beneficial. Following this visit pt was prescribed a triamcinolone cream to apply to the area 1-2 times daily.   Currently Tiffany [name] states she was initially using the cream, but after noticing her rash was worsening she stopped use. During today's visit, she reports that she also feels as though she was having an allergic reaction to the medication since the rash had become worse upon use. At this time she says since stopping the medication, she has noticed her rash has improved yet it is still noticeable. She has a follow-up appointment with dermatology next Tuesday for this.   LE Swelling  During today's visit, [name] believes her LE swelling has improved, due to elevating them regularly and avoiding high amounts of salt.  She is compliant with taking norvasc 5 mg daily, but is open to changing this medication if found to be cause of LE swelling. She has been on this medication for almost two years.   Chronic Cough Pt expresses that for the past 3 months, she has been experiencing a productive cough. She reports that her husband has experienced this as well but not for as long or as intensely as she has. States she has  had post nasal drip which starts a coughing spell and a fever on occasion. Denies night sweats, CP, SOB, and hemoptysis. She has not used any medication for this. She has had unintentional weight loss, see below.   Unintentional Weight Loss In addition to chronic cough and rash, Tiffany [name] has noticed she has been having unintentional weight loss. Pt does state that her appetite has changed, saying that she finds she is not hungry as often. Denies any abdominal pain or changes to stools. She has not changed her activity level. Thyroid labs are up-to-date and she is compliant with this.  Wt Readings from Last 10 Encounters:  03/09/21 210 lb 6.4 oz (95.4 kg)  02/16/21 214 lb 2 oz (97.1 kg)  12/21/20 220 lb (99.8 kg)  08/01/20 231 lb 9.6 oz (105.1 kg)  06/05/20 222 lb 12.8 oz (101.1 kg)  04/18/20 223 lb (101.2 kg)  04/11/20 222 lb (100.7 kg)  02/11/20 220 lb 9.6 oz (100.1 kg)  01/31/20 221 lb (100.2 kg)  12/14/19 216 lb 12.8 oz (98.3 kg)   Past Medical History:  Diagnosis Date   Bipolar 1 disorder (HCC)    Breast cancer (HCC)    Chronic diarrhea    loose stools twice a day on average for years   Chronic kidney disease (CKD), stage III (moderate) ([place])    Depression 1987   Hospitalization or health care facility  admission within last 6 months 04/2019   for fall/seizures   Malignant neoplasm of overlapping sites of left breast in female, estrogen receptor positive ([place]) 03/14/2016   Dx in 09/2014, s/p bilateral mastectomies and ALND, 0/10 LN. 1.4 cm  Grade I invasive lobular, ER and PR +/Her--, Ki 67 <5% Tried anastrozole for one month, but developed suicidal idea   Osteoarthritis, knee 09/24/2019   Xray 09/2019   Parkinson's disease ([place]) 2012   Psoriatic arthritis ([place])    PTSD (post-traumatic stress disorder)    Secondary hyperparathyroidism ([place])    Tardive dyskinesia      Social History   Tobacco Use   Smoking status: Never   Smokeless tobacco: Never  Vaping Use   Vaping Use: Never  used  Substance Use Topics   Alcohol use: Never   Drug use: Never    Past Surgical History:  Procedure Laterality Date   ABDOMINAL HYSTERECTOMY  1987   CHOLECYSTECTOMY  1979   DILATION AND CURETTAGE OF UTERUS  1973   MASTECTOMY Bilateral 09/19/2014   TONSILLECTOMY  1970   URETERAL REIMPLANTION Bilateral 1974    Family History  Problem Relation Age of Onset   Cancer Mother    Cancer Sister    Stroke Maternal Grandfather    Diabetes Paternal Grandfather    Cancer Sister     Allergies  Allergen Reactions   Aripiprazole Other (See Comments)    Parkinsonism      Lactose Intolerance (Gi) Diarrhea   Methotrexate Other (See Comments)    Hair loss, severe stomatitis     Cefdinir Diarrhea    Other reaction(s): Diarrhea Yeast infection and fever; negative c diff   Etanercept Other (See Comments)    Headaches     Exemestane Other (See Comments)    Suicidal thoughts with medication     Fluoxetine Other (See Comments)    Parkinsonism   Lactose     Other reaction(s): diarrhea Other reaction(s): diarrhea Other reaction(s): diarrhea   Methylprednisolone Sodium Succ Other (See Comments)    Agitated mania   Epinephrine Palpitations    tachycardia    Nitrofurantoin Nausea And Vomiting and Rash      Other reaction(s): rash, diarrhea Other reaction(s): rash, diarrhea Other reaction(s): rash, diarrhea Other reaction(s): rash, diarrhea    Current Medications:   Current Outpatient Medications:    amLODipine (NORVASC) 5 MG tablet, Take 5 mg by mouth daily., Disp: , Rfl:    Certolizumab Pegol (CIMZIA) 2 X 200 MG KIT, See admin instructions., Disp: , Rfl:    cholecalciferol (VITAMIN D3) 25 MCG (1000 UNIT) tablet, Take 1,000 Units by mouth daily., Disp: , Rfl:    diphenhydrAMINE (BENADRYL) 25 MG tablet, , Disp: , Rfl:    lamoTRIgine (LAMICTAL) 200 MG tablet, Take 1 tablet (200 mg total) by mouth at bedtime., Disp: 90 tablet, Rfl: 3   levothyroxine (SYNTHROID) 112 MCG  tablet, Take 1 tablet (112 mcg total) by mouth daily before breakfast., Disp: 90 tablet, Rfl: 0   lithium carbonate 300 MG capsule, TAKE 1 CAPSULE(300 MG) BY MOUTH AT BEDTIME, Disp: 90 capsule, Rfl: 3   LORazepam (ATIVAN) 1 MG tablet, TAKE 1 TABLET(1 MG) BY MOUTH TWICE DAILY AS NEEDED, Disp: 60 tablet, Rfl: 2   metroNIDAZOLE (METROGEL) 0.75 % gel, Apply to face 1-2 times daily, Disp: 45 g, Rfl: 0   prazosin (MINIPRESS) 5 MG capsule, Take 1 capsule (5 mg total) by mouth at bedtime., Disp: 90 capsule, Rfl: 3   sodium bicarbonate  650 MG tablet, Take 650 mg by mouth daily., Disp: , Rfl:    triamcinolone cream (KENALOG) 0.1 %, Apply 1 application topically 2 (two) times daily. (Patient not taking: Reported on 03/09/2021), Disp: 30 g, Rfl: 0   Review of Systems:   ROS Negative unless otherwise specified per HPI. Vitals:   Vitals:   03/09/21 1103  Temp: 98.1 F (36.7 C)  TempSrc: Temporal  Weight: 210 lb 6.4 oz (95.4 kg)  Height: _0  (1.626 m)     Body mass index is 36.12 kg/m.  Physical Exam:   Physical Exam Vitals and nursing note reviewed.  Constitutional:      General: She is not in acute distress.    Appearance: She is well-developed. She is not ill-appearing or toxic-appearing.  Cardiovascular:     Rate and Rhythm: Normal rate and regular rhythm.     Pulses: Normal pulses.     Heart sounds: Normal heart sounds, S1 normal and S2 normal.  Pulmonary:     Effort: Pulmonary effort is normal.     Breath sounds: Normal breath sounds.  Musculoskeletal:     Right lower leg: 1+ Edema present.     Left lower leg: 1+ Edema present.  Skin:    General: Skin is warm and dry.     Comments: Erythematous plaque to anterior aspect of left lower leg without warmth or swelling  Neurological:     Mental Status: She is alert.     GCS: GCS eye subscore is 4. GCS verbal subscore is 5. GCS motor subscore is 6.  Psychiatric:        Speech: Speech normal.        Behavior: Behavior normal.  Behavior is cooperative.    Assessment and Plan:   Rash Uncontrolled Follow-up with dermatology next week  Leg swelling Slightly improved We are can have patient hold her Norvasc --to see if this is contributing Advised patient as follows: Stop the norvasc/amlodopine -- this may be contributing to your swelling Check blood pressure daily If BP is consistently > 140/90, call me and we will start a new low dose blood pressure medication (IF holding the amlodopine has made a difference in your swelling)  Chronic cough We will update chest x-ray given chronicity and unintentional weight loss Advised patient as follows: Consider over-the-counter antihistamines Claritin/Loratadine and Zyrtec/Cetrizine. You can use Robitussin DM Mucinex and Mucinex DM for cough.  Follow-up in 1 month, sooner if concerns  Unintentional weight loss New She is up-to-date on all cancer screenings We will update her blood work for initial CBC and CMP; TSH is uptodate I recommended that she weigh herself weekly Follow-up with me in 1 month  I,Havlyn C Ratchford,acting as a Education administrator for [employer], PA.,have documented all relevant documentation on the behalf of Inda Coke, PA,as directed by  Inda Coke, PA while in the presence of Inda Coke, [place].  I, Inda Coke, [place], have reviewed all documentation for this visit. The documentation on 03/09/21 for the exam, diagnosis, procedures, and orders are all accurate and complete.   Inda Coke, PA-C

## 2021-03-09 ENCOUNTER — Encounter: Payer: Self-pay | Admitting: Physician Assistant

## 2021-03-09 ENCOUNTER — Ambulatory Visit (INDEPENDENT_AMBULATORY_CARE_PROVIDER_SITE_OTHER): Payer: Medicare Other | Admitting: Physician Assistant

## 2021-03-09 ENCOUNTER — Encounter (HOSPITAL_BASED_OUTPATIENT_CLINIC_OR_DEPARTMENT_OTHER): Payer: Self-pay | Admitting: Physical Therapy

## 2021-03-09 VITALS — BP 120/72 | HR 66 | Temp 98.1°F | Ht 64.0 in | Wt 210.4 lb

## 2021-03-09 DIAGNOSIS — R21 Rash and other nonspecific skin eruption: Secondary | ICD-10-CM

## 2021-03-09 DIAGNOSIS — M7989 Other specified soft tissue disorders: Secondary | ICD-10-CM

## 2021-03-09 DIAGNOSIS — R634 Abnormal weight loss: Secondary | ICD-10-CM

## 2021-03-09 DIAGNOSIS — R053 Chronic cough: Secondary | ICD-10-CM | POA: Diagnosis not present

## 2021-03-09 NOTE — Patient Instructions (Addendum)
It was great to see you!  For your weight -Weigh yourself once a week and write down -Get blood work -- this will be at the same location as the chest xray, see below for that info -Follow-up with me in 1 month  For your cough --Consider over-the-counter antihistamines Claritin/Loratadine and Zyrtec/Cetrizine. --You can use Robitussin DM Mucinex and Mucinex DM for cough.  --Get xray An order for an xray has been put in for you. To get your xray, you can walk in at the [facility] location without a scheduled appointment.  The address is [address]. It is across the street from [place] is located in the basement.  Hours of operation are M-F 8:30am to 5:00pm. Please note that they are closed for lunch between 12:30 and 1:00pm.  Consider stopping the benadryl for sleep -- I'm not entirely convinced that you need it.  For your blood pressure Stop the norvasc/amlodopine -- this may be contributing to your swelling Check blood pressure daily If BP is consistently > 140/90, call me and we will start a new low dose blood pressure medication (IF holding the amlodopine has made a difference in your swelling)  Take care,  Inda Coke PA-C

## 2021-03-09 NOTE — Therapy (Signed)
[facility] [address] [place], [name], [address] Phone: [phone]   Fax:  [phone]  Physical Therapy Treatment  Patient Details  Name: Tiffany [name] MRN: [id number] Date of Birth: 1950/05/19 Referring Provider (PT): Inda Coke, [place]   Encounter Date: 03/08/2021   PT End of Session - 03/09/21 1254     Visit Number 10    Number of Visits 21    Date for PT Re-Evaluation 04/17/21    Authorization Type Medicare progress not perfromed on visit 9; next visit at 47    PT Start Time 16    PT Stop Time 1313    PT Time Calculation (min) 43 min    Activity Tolerance Patient tolerated treatment well    Behavior During Therapy [facility] for tasks assessed/performed             Past Medical History:  Diagnosis Date   Bipolar 1 disorder ([place])    Breast cancer ([place])    Chronic diarrhea    loose stools twice a day on average for years   Chronic kidney disease (CKD), stage III (moderate) ([place])    Depression 1987   Hospitalization or health care facility admission within last 6 months 04/2019   for fall/seizures   Malignant neoplasm of overlapping sites of left breast in female, estrogen receptor positive ([place]) 03/14/2016   Dx in 09/2014, s/p bilateral mastectomies and ALND, 0/10 LN. 1.4 cm  Grade I invasive lobular, ER and PR +/Her--, Ki 67 <5% Tried anastrozole for one month, but developed suicidal idea   Osteoarthritis, knee 09/24/2019   Xray 09/2019   Parkinson's disease ([place]) 2012   Psoriatic arthritis ([place])    PTSD (post-traumatic stress disorder)    Secondary hyperparathyroidism ([place])    Tardive dyskinesia     Past Surgical History:  Procedure Laterality Date   [place] Bilateral 09/19/2014   TONSILLECTOMY  1970   URETERAL REIMPLANTION Bilateral 1974    There were no vitals filed for this visit.   Subjective  Assessment - 03/08/21 1241     Subjective Patient became very sore after the last visit. She is having some trouble bending her knee and is having pain in her lateral hip. She has signifcant pain when she bends her knee and when she lies on her side. she had ain last night trying to sleep.    Limitations Standing;Walking;House hold activities    How long can you sit comfortably? no issues    How long can you stand comfortably? Could be immediate or an hour. For sure after an hour.    How long can you walk comfortably? has to use a device for limited distances    Patient Stated Goals Getting stronger, improving balance, pain free    Currently in Pain? Yes    Pain Score 3    just sittin, up to a 9/10 with movement   Pain Location Knee    Pain Orientation Right    Pain Descriptors / Indicators Aching    Pain Type Chronic pain    Pain Onset More than a month ago    Pain Frequency Intermittent    Aggravating Factors  pain standing and walking    Pain Relieving Factors light stretching and movement    Effect of Pain on Daily Activities limits daily activity  Lumbar Exercises: Supine    Other Supine Lumbar Exercises quad set 3x10 5 sec hold  Seated hip abduction yellow 3x10  Seated march 2x10  Nu-step L1 with cuing for pain free motion x5 min           Manual Therapy    Manual Therapy Joint mobilization;Soft tissue mobilization;Manual Traction;Passive ROM     Joint Mobilization grade 1 and II PA and AP mobilization of the right knee to improve extension;   Roller to gluteal and IT band; trigger poin release to back; passive knee extension                        PT Education - 03/08/21 1246     Education Details reviewed time fram for DOMS    Person(s) Educated Patient    Methods Explanation;Demonstration;Verbal cues;Tactile cues    Comprehension Verbalized understanding;Returned demonstration;Verbal cues required               PT Short Term Goals - 03/07/21 0835       PT SHORT TERM GOAL #1   Title Pt will improve tug with rollater below 14 secs to show improvement in ambulation.    Baseline 14 seconds 12/28    Time 3    Period Weeks    Status Achieved      PT SHORT TERM GOAL #2   Title Patient will improve strength for bilateral hip flexion to 5/5.    Baseline tested with hand dyno today    Time 3    Period Weeks    Status On-going      PT SHORT TERM GOAL #3   Title Patient will performed 5x sit to stand below 10  secs to show improvement is functional strenght.    Baseline 16 sec    Time 4    Period Weeks    Status On-going    Target Date 10/04/20      PT SHORT TERM GOAL #4   Title Pt will improve 5x sit<>stand to less than or equal to 18 seconds for improved functional strength.    Baseline 16.93 sec 06/30/2019    Time 4    Period Weeks    Status On-going      PT SHORT TERM GOAL #5   Title Pt/husband will verbalize understanding of fall prevention in home environment.    Time 4    Period Weeks    Status On-going               PT Long Term Goals - 01/15/21 1409       PT LONG TERM GOAL #1   Title Pt will be be able to perform six minute walk test and ambulate 700 feet without a device or with a less restricted device such as a cane independently in order to ambualte in the community    Time 8    Period Weeks    Status On-going      PT LONG TERM GOAL #2   Title Pt will go up and down 6 steps without increased pain    Baseline 3 steps    Time 6    Period Weeks    Status New      PT LONG TERM GOAL #3   Title Patient will be independent with pool program and general strengthening program in order to continue to progress mobility    Time 6    Period Weeks    Status On-going  Plan - 03/09/21 1255     Clinical Impression Statement Patient came in sore today following her last visit. She was educated on delayed onset muslce soreness and ways to improve  it. Therapy focused on manual therapy today including to her hip and IT band. She reported improved pain after treatment. She perfromed light exercises without difficulty. Therapy will continue to progress as tolerated. She reported improved pain and abilit yto bend her knee and hip after treatment.    Personal Factors and Comorbidities Fitness;Time since onset of injury/illness/exacerbation;Comorbidity 3+    Comorbidities multiple pain locations, back, hip, knee, Decreased mobility post covid and hospitalization last year,    Examination-Activity Limitations Stand;Locomotion Level;Bend;Dressing;Squat;Stairs    Examination-Participation Restrictions Cleaning;Meal Prep;Yard Work;Community Activity;Shop;Laundry    Stability/Clinical Decision Making Evolving/Moderate complexity    Clinical Decision Making Moderate    Rehab Potential Good    PT Frequency 2x / week    PT Duration 6 weeks    PT Treatment/Interventions ADLs/Self Care Home Management;Cryotherapy;Electrical Stimulation;Ultrasound;Traction;Moist Heat;Iontophoresis 4mg /ml Dexamethasone;DME Instruction;Gait training;Stair training;Functional mobility training;Therapeutic activities;Therapeutic exercise;Orthotic Fit/Training;Patient/family education;Neuromuscular re-education;Balance training;Manual techniques;Taping;Dry needling;Passive range of motion;Spinal Manipulations;Joint Manipulations;Aquatic Therapy;Vasopneumatic Device    PT Next Visit Plan begin porgresisve strengthening in the pool. Be aware of mobility in and out of the pool. Work on balance exercises. Genral strengthening of the lower extremitys;    PT Home Exercise Plan Access Code: [facility]  URL: [email or web address]  Date: 09/06/2020  Prepared by: Carolyne Littles    Exercises  Seated Knee Extension with Resistance - 1 x daily - 7 x weekly - 3 sets - 10 reps  Seated March with Resistance - 1 x daily - 7 x weekly - 3 sets - 10 reps  Seated Hip Abduction with Resistance -  1 x daily - 7 x weekly - 3 sets - 10 reps    Consulted and Agree with Plan of Care Patient             Patient will benefit from skilled therapeutic intervention in order to improve the following deficits and impairments:  Abnormal gait, Pain, Increased muscle spasms, Decreased activity tolerance, Decreased endurance, Decreased range of motion, Decreased strength, Impaired flexibility, Difficulty walking, Decreased balance, Decreased safety awareness  Visit Diagnosis: Other abnormalities of gait and mobility  Unsteadiness on feet  Muscle weakness (generalized)     Problem List Patient Active Problem List   Diagnosis Date Noted   Osteoarthritis, knee 09/24/2019   CKD (chronic kidney disease) stage 4, GFR 15-29 ml/min ([place]) 09/24/2019   Severe recurrent major depression without psychotic features ([place]) 09/09/2019   Bipolar I disorder, most recent episode depressed ([place])    Transaminitis    Essential hypertension    Encephalopathy 04/26/2019   Leukocytosis 04/21/2019   Thrombocytopenia ([place]) 09/04/2018   Vitamin D deficiency 09/04/2018   Psoriatic arthritis ([place]) 09/04/2018   Tubular adenoma of colon 05/28/2017   History of colonic polyps 05/28/2017   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 09/12/2016   Malignant neoplasm of overlapping sites of left breast in female, estrogen receptor positive ([place]) 03/14/2016   Chronic kidney disease, stage III (moderate) 01/19/2016   Tardive dyskinesia 10/18/2015   History of breast cancer left 2016 12/27/2014   Osteopenia determined by x-ray 10/31/2014   Rosacea 05/21/2007   Bipolar affective disorder, mixed ([place]) 08/16/2004   Acquired hypothyroidism 04/03/1996    Carney Living, PT 03/09/2021, 1:55 PM  [facility] [address] [place], [name], [address] Phone:  [phone]   Fax:  [phone]  Name: Zaylee Cornia MRN: [id number] Date of Birth:  08/29/1950

## 2021-03-13 ENCOUNTER — Other Ambulatory Visit (INDEPENDENT_AMBULATORY_CARE_PROVIDER_SITE_OTHER): Payer: Medicare Other

## 2021-03-13 ENCOUNTER — Ambulatory Visit (INDEPENDENT_AMBULATORY_CARE_PROVIDER_SITE_OTHER)
Admission: RE | Admit: 2021-03-13 | Discharge: 2021-03-13 | Disposition: A | Discharge status: Home / Self Care | Payer: Medicare Other | Source: Ambulatory Visit | Attending: Physician Assistant | Admitting: Physician Assistant

## 2021-03-13 ENCOUNTER — Other Ambulatory Visit: Payer: Self-pay

## 2021-03-13 DIAGNOSIS — R634 Abnormal weight loss: Secondary | ICD-10-CM

## 2021-03-13 DIAGNOSIS — R053 Chronic cough: Secondary | ICD-10-CM

## 2021-03-13 LAB — CBC WITH DIFFERENTIAL/PLATELET
Basophils Absolute: 0.1 10*3/uL (ref 0.0–0.1)
Basophils Relative: 0.6 % (ref 0.0–3.0)
Eosinophils Absolute: 0.3 10*3/uL (ref 0.0–0.7)
Eosinophils Relative: 3 % (ref 0.0–5.0)
HCT: 40.6 % (ref 36.0–46.0)
Hemoglobin: 13.1 g/dL (ref 12.0–15.0)
Lymphocytes Relative: 30.6 % (ref 12.0–46.0)
Lymphs Abs: 2.9 10*3/uL (ref 0.7–4.0)
MCHC: 32.3 g/dL (ref 30.0–36.0)
MCV: 91 fl (ref 78.0–100.0)
Monocytes Absolute: 0.5 10*3/uL (ref 0.1–1.0)
Monocytes Relative: 4.8 % (ref 3.0–12.0)
Neutro Abs: 5.9 10*3/uL (ref 1.4–7.7)
Neutrophils Relative %: 61 % (ref 43.0–77.0)
Platelets: 220 10*3/uL (ref 150.0–400.0)
RBC: 4.46 Mil/uL (ref 3.87–5.11)
RDW: 15 % (ref 11.5–15.5)
WBC: 9.6 10*3/uL (ref 4.0–10.5)

## 2021-03-13 LAB — COMPREHENSIVE METABOLIC PANEL
ALT: 12 U/L (ref 0–35)
AST: 13 U/L (ref 0–37)
Albumin: 4.3 g/dL (ref 3.5–5.2)
Alkaline Phosphatase: 119 U/L — ABNORMAL HIGH (ref 39–117)
BUN: 24 mg/dL — ABNORMAL HIGH (ref 6–23)
CO2: 25 mEq/L (ref 19–32)
Calcium: 9.5 mg/dL (ref 8.4–10.5)
Chloride: 108 mEq/L (ref 96–112)
Creatinine, Ser: 2.3 mg/dL — ABNORMAL HIGH (ref 0.40–1.20)
GFR: 20.72 mL/min — ABNORMAL LOW (ref >60.00)
Glucose, Bld: 84 mg/dL (ref 70–99)
Potassium: 4.6 mEq/L (ref 3.5–5.1)
Sodium: 140 mEq/L (ref 135–145)
Total Bilirubin: 1 mg/dL (ref 0.2–1.2)
Total Protein: 7.6 g/dL (ref 6.0–8.3)

## 2021-03-14 ENCOUNTER — Encounter (HOSPITAL_BASED_OUTPATIENT_CLINIC_OR_DEPARTMENT_OTHER): Payer: Self-pay | Admitting: Physical Therapy

## 2021-03-14 ENCOUNTER — Ambulatory Visit (HOSPITAL_BASED_OUTPATIENT_CLINIC_OR_DEPARTMENT_OTHER): Payer: Medicare Other | Attending: Physician Assistant | Admitting: Physical Therapy

## 2021-03-14 DIAGNOSIS — R2681 Unsteadiness on feet: Secondary | ICD-10-CM | POA: Diagnosis present

## 2021-03-14 DIAGNOSIS — M25652 Stiffness of left hip, not elsewhere classified: Secondary | ICD-10-CM | POA: Insufficient documentation

## 2021-03-14 DIAGNOSIS — R2689 Other abnormalities of gait and mobility: Secondary | ICD-10-CM | POA: Insufficient documentation

## 2021-03-14 DIAGNOSIS — M6281 Muscle weakness (generalized): Secondary | ICD-10-CM | POA: Insufficient documentation

## 2021-03-14 DIAGNOSIS — M25552 Pain in left hip: Secondary | ICD-10-CM | POA: Insufficient documentation

## 2021-03-14 NOTE — Therapy (Signed)
[facility] [address] [place], [name], [address] Phone: [phone]   Fax:  [phone]  Physical Therapy Treatment  Patient Details  Name: Tiffany [name] MRN: [id number] Date of Birth: [birth year] Referring Provider (PT): Inda Coke, [place]   Encounter Date: 03/14/2021   PT End of Session - 03/14/21 1457     Visit Number 11    Number of Visits 21    Date for PT Re-Evaluation 04/17/21    Authorization Type Medicare progress not perfromed on visit 9; next visit at 3    PT Start Time 1500    PT Stop Time 1542    PT Time Calculation (min) 42 min    Activity Tolerance Patient tolerated treatment well    Behavior During Therapy [facility] for tasks assessed/performed             Past Medical History:  Diagnosis Date   Bipolar 1 disorder ([place])    Breast cancer ([place])    Chronic diarrhea    loose stools twice a day on average for years   Chronic kidney disease (CKD), stage III (moderate) ([place])    Depression 1987   Hospitalization or health care facility admission within last 6 months 04/2019   for fall/seizures   Malignant neoplasm of overlapping sites of left breast in female, estrogen receptor positive ([place]) 03/14/2016   Dx in 09/2014, s/p bilateral mastectomies and ALND, 0/10 LN. 1.4 cm  Grade I invasive lobular, ER and PR +/Her--, Ki 67 <5% Tried anastrozole for one month, but developed suicidal idea   Osteoarthritis, knee 09/24/2019   Xray 09/2019   Parkinson's disease ([place]) 2012   Psoriatic arthritis ([place])    PTSD (post-traumatic stress disorder)    Secondary hyperparathyroidism ([place])    Tardive dyskinesia     Past Surgical History:  Procedure Laterality Date   [place] Bilateral 09/19/2014   TONSILLECTOMY  1970   URETERAL REIMPLANTION Bilateral 1974    There were no vitals filed for this visit.   Subjective  Assessment - 03/14/21 1503     Subjective Pt reports being very nervous to get into the pool. She is fidgety.    Currently in Pain? Yes    Pain Score 0-No pain    Pain Location Knee    Pain Orientation Right    Pain Descriptors / Indicators Aching    Pain Type Chronic pain    Pain Onset More than a month ago    Pain Frequency Intermittent    Pain Score 0    Pain Location Back    Pain Orientation Left    Pain Descriptors / Indicators Aching;Radiating    Pain Type Chronic pain    Pain Onset More than a month ago    Pain Frequency Intermittent                                          PT Short Term Goals - 03/07/21 0835       PT SHORT TERM GOAL #1   Title Pt will improve tug with rollater below 14 secs to show improvement in ambulation.    Baseline 14 seconds 12/28    Time 3    Period Weeks    Status Achieved  PT SHORT TERM GOAL #2   Title Patient will improve strength for bilateral hip flexion to 5/5.    Baseline tested with hand dyno today    Time 3    Period Weeks    Status On-going      PT SHORT TERM GOAL #3   Title Patient will performed 5x sit to stand below 10  secs to show improvement is functional strenght.    Baseline 16 sec    Time 4    Period Weeks    Status On-going    Target Date 10/04/20      PT SHORT TERM GOAL #4   Title Pt will improve 5x sit<>stand to less than or equal to 18 seconds for improved functional strength.    Baseline 16.93 sec 06/30/2019    Time 4    Period Weeks    Status On-going      PT SHORT TERM GOAL #5   Title Pt/husband will verbalize understanding of fall prevention in home environment.    Time 4    Period Weeks    Status On-going               PT Long Term Goals - 01/15/21 1409       PT LONG TERM GOAL #1   Title Pt will be be able to perform six minute walk test and ambulate 700 feet without a device or with a less restricted device such as a cane independently in order to  ambualte in the community    Time 8    Period Weeks    Status On-going      PT LONG TERM GOAL #2   Title Pt will go up and down 6 steps without increased pain    Baseline 3 steps    Time 6    Period Weeks    Status New      PT LONG TERM GOAL #3   Title Patient will be independent with pool program and general strengthening program in order to continue to progress mobility    Time 6    Period Weeks    Status On-going             Pt seen for aquatic therapy today.  Treatment took place in water 3.25-4.8 ft in depth at the [facility] pool. Temp of water was 93.  Pt entered/exited the pool via stairs (step to pattern) independently with bilat rail.  Pt introduced to setting hha using yellow noodle walking throughout pool to 4 ft.  Pt afraid to go deeper, started to cry.  Returned to 3 ft pt walking fwd multiple widths.  Seated on bench: TKE 3x15 reps; flutter kicking at hip (SLR) 2x20; add/abd 2x20.  -STS cues for immediate standing balance, weight shifting x 10 Gait training: 71ft back and forth in front of bench where pt feeling confident to maneuver. Cues for heel strike and increased step length encouraging improve knee flex through swing.  Retro amb x 4 HHA cues for technique.  Pt requires buoyancy for support and to offload joints with strengthening exercises. Viscosity of the water is needed for resistance of strengthening; water current perturbations provides challenge to standing balance unsupported, requiring increased core activation.        Plan - 03/14/21 1916     Clinical Impression Statement Pt introduced to setting.  She is nerveous and at one points starts to tear up.  I was able to encourage increasing confidenc by staying close and/or  touching pt.  Over time she became more acclimated and able to complete activitys and benefit from session. We focused on walking and genral LE exercises in sitting and standing.  Able to gait train with pt walking in  front of water bench unsupported.  Pt reports decreased pain while submerged and feeling good to be able to move her legs as easily as she could in the water.  She is  good candidate for aquatic and will benefit from treatment facilitating and enhancing progress towards goals.    Personal Factors and Comorbidities Fitness;Time since onset of injury/illness/exacerbation;Comorbidity 3+    Examination-Activity Limitations Stand;Locomotion Level;Bend;Dressing;Squat;Stairs    Examination-Participation Restrictions Cleaning;Meal Prep;Yard Work;Community Activity;Shop;Laundry    Stability/Clinical Decision Making Evolving/Moderate complexity    Clinical Decision Making Moderate    Rehab Potential Good    PT Frequency 2x / week    PT Duration 6 weeks    PT Treatment/Interventions ADLs/Self Care Home Management;Cryotherapy;Electrical Stimulation;Ultrasound;Traction;Moist Heat;Iontophoresis 4mg /ml Dexamethasone;DME Instruction;Gait training;Stair training;Functional mobility training;Therapeutic activities;Therapeutic exercise;Orthotic Fit/Training;Patient/family education;Neuromuscular re-education;Balance training;Manual techniques;Taping;Dry needling;Passive range of motion;Spinal Manipulations;Joint Manipulations;Aquatic Therapy;Vasopneumatic Device    PT Next Visit Plan begin porgresisve strengthening in the pool. Be aware of mobility in and out of the pool. Work on balance exercises. Genral strengthening of the lower extremitys;    PT Home Exercise Plan Access Code: [facility]  URL: [email or web address]  Date: 09/06/2020  Prepared by: Carolyne Littles    Exercises  Seated Knee Extension with Resistance - 1 x daily - 7 x weekly - 3 sets - 10 reps  Seated March with Resistance - 1 x daily - 7 x weekly - 3 sets - 10 reps  Seated Hip Abduction with Resistance - 1 x daily - 7 x weekly - 3 sets - 10 reps             Patient will benefit from skilled therapeutic intervention in order to improve the  following deficits and impairments:  Abnormal gait, Pain, Increased muscle spasms, Decreased activity tolerance, Decreased endurance, Decreased range of motion, Decreased strength, Impaired flexibility, Difficulty walking, Decreased balance, Decreased safety awareness  Visit Diagnosis: Other abnormalities of gait and mobility  Unsteadiness on feet  Muscle weakness (generalized)     Problem List Patient Active Problem List   Diagnosis Date Noted   Osteoarthritis, knee 09/24/2019   CKD (chronic kidney disease) stage 4, GFR 15-29 ml/min ([place]) 09/24/2019   Severe recurrent major depression without psychotic features ([place]) 09/09/2019   Bipolar I disorder, most recent episode depressed ([place])    Transaminitis    Essential hypertension    Encephalopathy 04/26/2019   Leukocytosis 04/21/2019   Thrombocytopenia ([place]) 09/04/2018   Vitamin D deficiency 09/04/2018   Psoriatic arthritis ([place]) 09/04/2018   Tubular adenoma of colon 05/28/2017   History of colonic polyps 05/28/2017   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 [place], [name], [address] Phone: [phone]   Fax:  [phone]  Name: Stefana Lodico MRN: [id number] Date of Birth: Aug 09, 1950

## 2021-03-15 ENCOUNTER — Encounter: Payer: Self-pay | Admitting: Physician Assistant

## 2021-03-15 NOTE — Telephone Encounter (Signed)
[place], please  see patients message. Thanks

## 2021-03-16 ENCOUNTER — Ambulatory Visit (INDEPENDENT_AMBULATORY_CARE_PROVIDER_SITE_OTHER): Payer: Medicare Other | Admitting: Psychiatry

## 2021-03-16 ENCOUNTER — Other Ambulatory Visit: Payer: Self-pay

## 2021-03-16 DIAGNOSIS — Z8669 Personal history of other diseases of the nervous system and sense organs: Secondary | ICD-10-CM

## 2021-03-16 DIAGNOSIS — F4310 Post-traumatic stress disorder, unspecified: Secondary | ICD-10-CM

## 2021-03-16 DIAGNOSIS — Z630 Problems in relationship with spouse or partner: Secondary | ICD-10-CM

## 2021-03-16 DIAGNOSIS — F3132 Bipolar disorder, current episode depressed, moderate: Secondary | ICD-10-CM

## 2021-03-16 DIAGNOSIS — G2401 Drug induced subacute dyskinesia: Secondary | ICD-10-CM

## 2021-03-16 DIAGNOSIS — F4010 Social phobia, unspecified: Secondary | ICD-10-CM | POA: Diagnosis not present
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
## 2021-03-20 ENCOUNTER — Ambulatory Visit (HOSPITAL_BASED_OUTPATIENT_CLINIC_OR_DEPARTMENT_OTHER): Payer: Medicare Other | Admitting: Physical Therapy

## 2021-03-20 ENCOUNTER — Encounter (HOSPITAL_BASED_OUTPATIENT_CLINIC_OR_DEPARTMENT_OTHER): Payer: Self-pay | Admitting: Physical Therapy

## 2021-03-20 ENCOUNTER — Ambulatory Visit (HOSPITAL_BASED_OUTPATIENT_CLINIC_OR_DEPARTMENT_OTHER): Payer: Self-pay | Admitting: Physical Therapy

## 2021-03-20 ENCOUNTER — Other Ambulatory Visit: Payer: Self-pay

## 2021-03-20 DIAGNOSIS — R2681 Unsteadiness on feet: Secondary | ICD-10-CM

## 2021-03-20 DIAGNOSIS — R2689 Other abnormalities of gait and mobility: Secondary | ICD-10-CM | POA: Diagnosis not present

## 2021-03-20 DIAGNOSIS — M6281 Muscle weakness (generalized): Secondary | ICD-10-CM

## 2021-03-20 NOTE — Therapy (Signed)
[facility] [address] [place], [name], [address] Phone: [phone]   Fax:  [phone]  Physical Therapy Treatment  Patient Details  Name: Tiffany [name] MRN: [id number] Date of Birth: 11-18-1950 Referring Provider (PT): Inda Coke, [place]   Encounter Date: 03/20/2021   PT End of Session - 03/20/21 1704     Visit Number 12    Number of Visits 21    Date for PT Re-Evaluation 04/17/21    Authorization Type Medicare progress not perfromed on visit 9; next visit at 53    PT Start Time 1703    PT Stop Time 1750    PT Time Calculation (min) 47 min    Activity Tolerance Patient tolerated treatment well    Behavior During Therapy [facility] for tasks assessed/performed             Past Medical History:  Diagnosis Date   Bipolar 1 disorder ([place])    Breast cancer ([place])    Chronic diarrhea    loose stools twice a day on average for years   Chronic kidney disease (CKD), stage III (moderate) ([place])    Depression 1987   Hospitalization or health care facility admission within last 6 months 04/2019   for fall/seizures   Malignant neoplasm of overlapping sites of left breast in female, estrogen receptor positive ([place]) 03/14/2016   Dx in 09/2014, s/p bilateral mastectomies and ALND, 0/10 LN. 1.4 cm  Grade I invasive lobular, ER and PR +/Her--, Ki 67 <5% Tried anastrozole for one month, but developed suicidal idea   Osteoarthritis, knee 09/24/2019   Xray 09/2019   Parkinson's disease ([place]) 2012   Psoriatic arthritis ([place])    PTSD (post-traumatic stress disorder)    Secondary hyperparathyroidism ([place])    Tardive dyskinesia     Past Surgical History:  Procedure Laterality Date   [place] Bilateral 09/19/2014   TONSILLECTOMY  1970   URETERAL REIMPLANTION Bilateral 1974    There were no vitals filed for this visit.   Subjective  Assessment - 03/20/21 1809     Subjective "I felt pretty good after last session.  My back has been hurting today but right now it isn't    Currently in Pain? No/denies    Pain Score 0                                           PT Short Term Goals - 03/07/21 0835       PT SHORT TERM GOAL #1   Title Pt will improve tug with rollater below 14 secs to show improvement in ambulation.    Baseline 14 seconds 12/28    Time 3    Period Weeks    Status Achieved      PT SHORT TERM GOAL #2   Title Patient will improve strength for bilateral hip flexion to 5/5.    Baseline tested with hand dyno today    Time 3    Period Weeks    Status On-going      PT SHORT TERM GOAL #3   Title Patient will performed 5x sit to stand below 10  secs to show improvement is functional strenght.    Baseline 16 sec    Time 4  Period Weeks    Status On-going    Target Date 10/04/20      PT SHORT TERM GOAL #4   Title Pt will improve 5x sit<>stand to less than or equal to 18 seconds for improved functional strength.    Baseline 16.93 sec 06/30/2019    Time 4    Period Weeks    Status On-going      PT SHORT TERM GOAL #5   Title Pt/husband will verbalize understanding of fall prevention in home environment.    Time 4    Period Weeks    Status On-going               PT Long Term Goals - 01/15/21 1409       PT LONG TERM GOAL #1   Title Pt will be be able to perform six minute walk test and ambulate 700 feet without a device or with a less restricted device such as a cane independently in order to ambualte in the community    Time 8    Period Weeks    Status On-going      PT LONG TERM GOAL #2   Title Pt will go up and down 6 steps without increased pain    Baseline 3 steps    Time 6    Period Weeks    Status New      PT LONG TERM GOAL #3   Title Patient will be independent with pool program and general strengthening program in order to continue to progress  mobility    Time 6    Period Weeks    Status On-going             Pt seen for aquatic therapy today.  Treatment took place in water 3.25-4.8 ft in depth at the [employer] pool. Temp of water was 93.  Pt entered/exited the pool via stairs (step to pattern) independently with bilat rail.  Walking for warm up and to acclimate pt again to pool, yellow noodle width wise in 3 ft fwd, bwd and sidestepping close supervision multiple reps STS from bench reviewing technique.  Pt able to complete indep gaining immediate standing balance indep. -hip hinges 2x15; lumbar rotation 2x15, core rotation x15  Seated 3rd water step: LAQ; marching; slr 2x10 reps; flutter kicking at hip (SLR) 2x20; add/abd 2x20. Added isometric abdominals for core engagement. Instruction for reassurance, hand placement on grab bars to secure position in sitting. Added aerobic capacity benefit. -STS from water bench with decreasing ue support to pushing up from step   Pt requires buoyancy for support and to offload joints with strengthening exercises. Viscosity of the water is needed for resistance of strengthening; water current perturbations provides challenge to standing balance unsupported, requiring increased core activation.        Plan - 03/20/21 1734     Clinical Impression Statement Pt demonstartes improved confidence in pool setting. Was able to focus on le stregthening and balance.  Remained in the 3 ft for pt's comfort.  With repitition pt demonstrates improved execution of STS transfers from bench progressing to 3rd water step. Pt completing multiple reps of mini squats and practicing immeditate standing balance with STS task. Pt amb with noodle acroos pool x 6 fwd and bwd indep at end of session.    Stability/Clinical Decision Making Evolving/Moderate complexity    Rehab Potential Good    PT Frequency 2x / week    PT Duration 6 weeks    PT  Treatment/Interventions [place]    PT Next Visit Plan  stair climbing    PT Home Exercise Plan Access Code: [facility]  URL: [email or web address]  Date: 09/06/2020  Prepared by: Carolyne Littles    Exercises  Seated Knee Extension with Resistance - 1 x daily - 7 x weekly - 3 sets - 10 reps  Seated March with Resistance - 1 x daily - 7 x weekly - 3 sets - 10 reps  Seated Hip Abduction with Resistance - 1 x daily - 7 x weekly - 3 sets - 10 reps              Patient will benefit from skilled therapeutic intervention in order to improve the following deficits and impairments:  Abnormal gait, Pain, Increased muscle spasms, Decreased activity tolerance, Decreased endurance, Decreased range of motion, Decreased strength, Impaired flexibility, Difficulty walking, Decreased balance, Decreased safety awareness  Visit Diagnosis: Other abnormalities of gait and mobility  Unsteadiness on feet  Muscle weakness (generalized)     Problem List Patient Active Problem List   Diagnosis Date Noted   Osteoarthritis, knee 09/24/2019   CKD (chronic kidney disease) stage 4, GFR 15-29 ml/min ([place]) 09/24/2019   Severe recurrent major depression without psychotic features ([place]) 09/09/2019   Bipolar I disorder, most recent episode depressed ([place])    Transaminitis    Essential hypertension    Encephalopathy 04/26/2019   Leukocytosis 04/21/2019   Thrombocytopenia ([place]) 09/04/2018   Vitamin D deficiency 09/04/2018   Psoriatic arthritis ([place]) 09/04/2018   Tubular adenoma of colon 05/28/2017   History of colonic polyps 05/28/2017   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 09/12/2016   Malignant neoplasm of overlapping sites of left breast in female, estrogen receptor positive ([place]) 03/14/2016   Chronic kidney disease, stage III (moderate) 01/19/2016   Tardive dyskinesia 10/18/2015   History of breast cancer left 2016 12/27/2014   Osteopenia determined by x-ray 10/31/2014   Rosacea 05/21/2007   Bipolar affective disorder,  mixed ([place]) 08/16/2004   Acquired hypothyroidism 04/03/1996    Annamarie Major) Clois Treanor MPT 03/20/2021, 6:15 PM  [facility] [address] [place], [name], [address] Phone: [phone]   Fax:  [phone]  Name: Kacey Dysert MRN: [id number] Date of Birth: [birth year]

## 2021-03-21 ENCOUNTER — Telehealth: Payer: Self-pay | Admitting: Physician Assistant

## 2021-03-21 ENCOUNTER — Other Ambulatory Visit: Payer: Self-pay

## 2021-03-21 ENCOUNTER — Emergency Department (HOSPITAL_BASED_OUTPATIENT_CLINIC_OR_DEPARTMENT_OTHER)
Admission: EM | Admit: 2021-03-21 | Discharge: 2021-03-21 | Disposition: A | Discharge status: Home / Self Care | Payer: Medicare Other | Attending: Emergency Medicine | Admitting: Emergency Medicine

## 2021-03-21 ENCOUNTER — Emergency Department (HOSPITAL_BASED_OUTPATIENT_CLINIC_OR_DEPARTMENT_OTHER): Payer: Medicare Other | Admitting: Radiology

## 2021-03-21 ENCOUNTER — Encounter (HOSPITAL_BASED_OUTPATIENT_CLINIC_OR_DEPARTMENT_OTHER): Payer: Self-pay | Admitting: Emergency Medicine

## 2021-03-21 DIAGNOSIS — E039 Hypothyroidism, unspecified: Secondary | ICD-10-CM | POA: Insufficient documentation

## 2021-03-21 DIAGNOSIS — R079 Chest pain, unspecified: Secondary | ICD-10-CM | POA: Diagnosis not present

## 2021-03-21 DIAGNOSIS — Z79899 Other long term (current) drug therapy: Secondary | ICD-10-CM | POA: Insufficient documentation

## 2021-03-21 DIAGNOSIS — Z853 Personal history of malignant neoplasm of breast: Secondary | ICD-10-CM | POA: Diagnosis not present

## 2021-03-21 DIAGNOSIS — R059 Cough, unspecified: Secondary | ICD-10-CM | POA: Insufficient documentation

## 2021-03-21 LAB — CBC WITH DIFFERENTIAL/PLATELET
Abs Immature Granulocytes: 0.02 10*3/uL (ref 0.00–0.07)
Basophils Absolute: 0.1 10*3/uL (ref 0.0–0.1)
Basophils Relative: 1 %
Eosinophils Absolute: 0.3 10*3/uL (ref 0.0–0.5)
Eosinophils Relative: 3 %
HCT: 43.9 % (ref 36.0–46.0)
Hemoglobin: 13.5 g/dL (ref 12.0–15.0)
Immature Granulocytes: 0 %
Lymphocytes Relative: 35 %
Lymphs Abs: 3.1 10*3/uL (ref 0.7–4.0)
MCH: 29.1 pg (ref 26.0–34.0)
MCHC: 30.8 g/dL (ref 30.0–36.0)
MCV: 94.6 fL (ref 80.0–100.0)
Monocytes Absolute: 0.5 10*3/uL (ref 0.1–1.0)
Monocytes Relative: 5 %
Neutro Abs: 4.8 10*3/uL (ref 1.7–7.7)
Neutrophils Relative %: 56 %
Platelets: 225 10*3/uL (ref 150–400)
RBC: 4.64 MIL/uL (ref 3.87–5.11)
RDW: 14.2 % (ref 11.5–15.5)
WBC: 8.7 10*3/uL (ref 4.0–10.5)
nRBC: 0 % (ref 0.0–0.2)

## 2021-03-21 LAB — COMPREHENSIVE METABOLIC PANEL
ALT: 12 U/L (ref 0–44)
AST: 15 U/L (ref 15–41)
Albumin: 4.5 g/dL (ref 3.5–5.0)
Alkaline Phosphatase: 106 U/L (ref 38–126)
Anion gap: 9 (ref 5–15)
BUN: 30 mg/dL — ABNORMAL HIGH (ref 8–23)
CO2: 24 mmol/L (ref 22–32)
Calcium: 9.7 mg/dL (ref 8.9–10.3)
Chloride: 109 mmol/L (ref 98–111)
Creatinine, Ser: 2.28 mg/dL — ABNORMAL HIGH (ref 0.44–1.00)
GFR, Estimated: 22 mL/min — ABNORMAL LOW (ref >60)
Glucose, Bld: 83 mg/dL (ref 70–99)
Potassium: 4 mmol/L (ref 3.5–5.1)
Sodium: 142 mmol/L (ref 135–145)
Total Bilirubin: 1.2 mg/dL (ref 0.3–1.2)
Total Protein: 7.8 g/dL (ref 6.5–8.1)

## 2021-03-21 LAB — D-DIMER, QUANTITATIVE: D-Dimer, Quant: 0.81 ug/mL-FEU — ABNORMAL HIGH (ref 0.00–0.50)

## 2021-03-21 LAB — TROPONIN I (HIGH SENSITIVITY)
Troponin I (High Sensitivity): 3 ng/L (ref <18)
Troponin I (High Sensitivity): 3 ng/L (ref <18)

## 2021-03-21 LAB — LIPASE, BLOOD: Lipase: 33 U/L (ref 11–51)

## 2021-03-21 MED ORDER — METHOCARBAMOL 500 MG PO TABS: 500.0000 mg | ORAL_TABLET | Freq: Two times a day (BID) | ORAL | 0 refills | Status: DC

## 2021-03-21 MED ORDER — MORPHINE SULFATE (PF) 2 MG/ML IV SOLN
2.0000 mg | Freq: Once | INTRAVENOUS | Status: AC
  Administered 2021-03-21: 2 mg via INTRAVENOUS
  Filled 2021-03-21: qty 1

## 2021-03-21 NOTE — Telephone Encounter (Signed)
FYI, see message. 

## 2021-03-21 NOTE — Discharge Instructions (Addendum)
Return here when she developed worsening chest pain, shortness of breath, or any other problems.

## 2021-03-21 NOTE — ED Triage Notes (Signed)
Rt sided cp x 1 week got worse this am no N/v/sob  no radiation

## 2021-03-21 NOTE — ED Provider Notes (Signed)
Patient signed to me by Dr. Dina Rich pending results of D-dimer.  D-dimer result came back elevated at 0.81.  Patient has no symptoms of PE at this time.  She has no leg pain or swelling.  Patient notes that her symptoms are worse with certain movements.  States that she for last 3 months has been experiencing URI symptoms.  She has pinpoint tenderness at her right anterior chest wall.  Her troponins are negative.  No suspicion for ACS.  She has no pneumothorax.  Long discussion with patient and her husband and shared decision-making was done.  Patient agrees to not have a VQ scan done as she has chronic kidney disease and cannot have a CT of her chest.  She has multiple allergies to anti-inflammatories and therefore will place on Robaxin for muscle relaxant.  Strict return precautions given   Lacretia Leigh, MD 03/21/21 1653

## 2021-03-21 NOTE — ED Provider Notes (Signed)
[facility] EMERGENCY DEPT Provider Note   CSN: [id number] Arrival date & time: 03/21/21  1109     History  Chief Complaint  Patient presents with   Chest Pain    Tiffany [name] is a 71 y.o. female.  HPI  71 year old female with past medical history of remote breast cancer status post bilateral mastectomy, hypothyroidism presents to the emergency department for right-sided chest pain.  Patient states for over a week she has been having sharp right-sided chest pain.  She feels as if it is worsening over the last day which is what prompted her for evaluation.  The pain is mostly with movement or deep inspiration.  No associated cough, shortness of breath, hemoptysis, lower extremity swelling.  Patient has been having an intermittent dry cough but no other injury to the right chest/ribs.  No recent fever.  Home Medications Prior to Admission medications   Medication Sig Start Date End Date Taking? Authorizing Provider  Certolizumab Pegol (CIMZIA) 2 X 200 MG KIT See admin instructions.   Yes [provider]  cholecalciferol (VITAMIN D3) 25 MCG (1000 UNIT) tablet Take 1,000 Units by mouth daily.   Yes [provider]  lamoTRIgine (LAMICTAL) 200 MG tablet Take 1 tablet (200 mg total) by mouth at bedtime. 01/24/21  Yes Mozingo, Berdie Ogren, NP  levothyroxine (SYNTHROID) 112 MCG tablet Take 1 tablet (112 mcg total) by mouth daily before breakfast. 02/27/21  Yes Morene Rankins, [place], PA  lithium carbonate 300 MG capsule TAKE 1 CAPSULE(300 MG) BY MOUTH AT BEDTIME 01/24/21  Yes Mozingo, Berdie Ogren, NP  LORazepam (ATIVAN) 1 MG tablet TAKE 1 TABLET(1 MG) BY MOUTH TWICE DAILY AS NEEDED 01/24/21  Yes Mozingo, Berdie Ogren, NP  metroNIDAZOLE (METROGEL) 0.75 % gel Apply to face 1-2 times daily 06/07/20  Yes Inda Coke, PA  prazosin (MINIPRESS) 5 MG capsule Take 1 capsule (5 mg total) by mouth at bedtime. 01/29/21  Yes Mozingo, Berdie Ogren, NP  sodium  bicarbonate 650 MG tablet Take 650 mg by mouth daily. 12/04/20  Yes [provider]  triamcinolone cream (KENALOG) 0.1 % Apply 1 application topically 2 (two) times daily. Patient not taking: Reported on 03/09/2021 02/16/21   Inda Coke, PA      Allergies    Aripiprazole, Lactose intolerance (gi), Methotrexate, Cefdinir, Etanercept, Exemestane, Fluoxetine, Lactose, Methylprednisolone sodium succ, Epinephrine, and Nitrofurantoin    Review of Systems   Review of Systems  Constitutional:  Positive for fatigue. Negative for fever.  Respiratory:  Positive for cough. Negative for shortness of breath.   Cardiovascular:  Positive for chest pain. Negative for palpitations and leg swelling.  Gastrointestinal:  Negative for abdominal pain, diarrhea and vomiting.  Skin:  Negative for rash.  Neurological:  Negative for headaches.   Physical Exam Updated Vital Signs BP 139/75    Pulse (!) 54    Temp 97.7 F (36.5 C) (Oral)    Resp 16    Ht _0  (1.626 m)    Wt 95.4 kg    SpO2 100%    BMI 36.11 kg/m  Physical Exam Vitals and nursing note reviewed.  Constitutional:      Appearance: Normal appearance. She is not diaphoretic.  HENT:     Head: Normocephalic.     Mouth/Throat:     Mouth: Mucous membranes are moist.  Cardiovascular:     Rate and Rhythm: Normal rate.  Pulmonary:     Effort: Pulmonary effort is normal. No respiratory distress.     Breath  sounds: No decreased breath sounds or rales.  Abdominal:     Palpations: Abdomen is soft.     Tenderness: There is no abdominal tenderness.  Skin:    General: Skin is warm.     Findings: No rash.  Neurological:     Mental Status: She is alert and oriented to person, place, and time. Mental status is at baseline.  Psychiatric:        Mood and Affect: Mood normal.    ED Results / Procedures / Treatments   Labs (all labs ordered are listed, but only abnormal results are displayed) Labs Reviewed  COMPREHENSIVE METABOLIC PANEL -  Abnormal; Notable for the following components:      Result Value   BUN 30 (*)    Creatinine, Ser 2.28 (*)    GFR, Estimated 22 (*)    All other components within normal limits  CBC WITH DIFFERENTIAL/PLATELET  LIPASE, BLOOD  TROPONIN I (HIGH SENSITIVITY)  TROPONIN I (HIGH SENSITIVITY)    EKG None  Radiology DG Ribs Unilateral W/Chest Right  Result Date: 03/21/2021 CLINICAL DATA:  Right-sided chest pain for 1 week. Mastectomy 5 years ago. No trauma. EXAM: RIGHT RIBS AND CHEST - 3+ VIEW COMPARISON:  03/13/2021 chest radiograph FINDINGS: Frontal view of the chest and two views of right-sided ribs. Midline trachea. Normal heart size. Atherosclerosis in the transverse aorta. No pleural effusion or pneumothorax. Right rib radiographs demonstrate cholecystectomy clips. Radiographic marker projects over approximately the eighth through tenth posterolateral right ribs. No rib fracture, callus deposition, or focal osseous lesion identified. IMPRESSION: No acute findings. Aortic Atherosclerosis (ICD10-I70.0). Electronically Signed   By: Abigail Miyamoto M.D.   On: 03/21/2021 13:23    Procedures Procedures    Medications Ordered in ED Medications  morphine 2 MG/ML injection 2 mg (2 mg Intravenous Given 03/21/21 1348)    ED Course/ Medical Decision Making/ A&P                           Medical Decision Making  71 year old female presents emergency department with right-sided chest pain.  She describes it as sharp, intermittent, feels deeper in the chest and not along the ribs.  Intermittent dry cough but no other injury to the chest.  Denies any shortness of breath, mopped assist, lower extremity swelling, previous history of DVT although remote history of breast cancer.  EKG is sinus rhythm, no ischemic changes.  Blood work is reassuring, baseline AKI, first troponin is 3.  X-ray shows no acute abnormality.  After morphine pain persist, specifically with deep inspiration.  Had a long discussion  about risk/benefit of ruling out PE with dimer given her AKI.  Given her history of breast cancer she wants to pursue this evaluation.  She understands if the dimer is positive that this would require a transfer for nuclear med study.  I have a lower suspicion for PE but will pursue D-dimer evaluation.  Patient signed out to Dr. Zenia Resides pending repeat troponin/D-dimer.        Final Clinical Impression(s) / ED Diagnoses Final diagnoses:  None    Rx / DC Orders ED Discharge Orders     None         Lorelle Gibbs, DO 03/21/21 1527

## 2021-03-21 NOTE — Telephone Encounter (Signed)
Pt is currently at ED       Patient Name: Tiffany [name] Gender: Female DOB: 10-Nov-1950 Age: 72 Y 58 M 26 D Return Phone Number: [phone] (Primary), [phone] (Secondary) Address: City/ State/ Zip: [place]  [address] Client [facility] Client Site [facility] Provider Morene Rankins, [place]- PA Contact Type Call Who Is Calling Patient / Member / Family / Caregiver Call Type Triage / Clinical Relationship To Patient Self Return Phone Number [phone] (Primary) Chief Complaint CHEST PAIN - pain, pressure, heaviness or tightness Reason for Call Symptomatic / Request for [place] states she is having right sided chest pain. [place] Not Listed [facility] Translation No Nurse Assessment Nurse: Fredderick Phenix, RN, Lelan Pons Date/Time (Eastern Time): 03/21/2021 10:14:50 AM Confirm and document reason for call. If symptomatic, describe symptoms. ---Caller states she is having right sided chest pain. Located right where she had a mastectomy 6 yrs ago. Started a week ago. Pain depends on her position. At worst, its a 9/10. If she takes a deep breath, its very sharp. Does the patient have any new or worsening symptoms? ---Yes Will a triage be completed? ---Yes Related visit to physician within the last 2 weeks? ---No Does the PT have any chronic conditions? (i.e. diabetes, asthma, this includes High risk factors for pregnancy, etc.) ---Yes List chronic conditions. ---arthritis, ca hx, bipolar, CKD Is this a behavioral health or substance abuse call? ---No Guidelines Guideline Title Affirmed Question Affirmed Notes Nurse Date/Time Eilene Ghazi Time) Chest Pain [1] Chest pain lasts > 5 minutes AND [2] age > 54 Fredderick Phenix, RN, Lelan Pons 03/21/2021 10:18:17 AM Disp. Time Eilene Ghazi Time) Disposition Final User 03/21/2021 10:12:27 AM Send to Urgent Queue Idolina Primer 03/21/2021  10:22:40 AM 911 Outcome Documentation Fredderick Phenix, RN, Lelan Pons Reason: Declines EMS, husband will drive her to the ER 9/32/6712 10:21:55 AM Call EMS 911 Now Yes Fredderick Phenix, RN, [place] Disagree/Comply Comply Caller Understands Yes PreDisposition Did not know what to do Care Advice Given Per Guideline CALL EMS 911 NOW: * Immediate medical attention is needed. You need to hang up and call 911 (or an ambulance). * Triager Discretion: I'll call you back in a few minutes to be sure you were able to reach them. CARE ADVICE given per Chest Pain (Adult) guideline. Referrals GO TO FACILITY OTHER - SPECIFY

## 2021-03-22 ENCOUNTER — Ambulatory Visit (HOSPITAL_BASED_OUTPATIENT_CLINIC_OR_DEPARTMENT_OTHER): Payer: Medicare Other | Admitting: Physical Therapy

## 2021-03-22 ENCOUNTER — Ambulatory Visit: Payer: Medicare Other

## 2021-03-22 ENCOUNTER — Encounter (HOSPITAL_BASED_OUTPATIENT_CLINIC_OR_DEPARTMENT_OTHER): Payer: Self-pay

## 2021-03-23 ENCOUNTER — Telehealth: Payer: Self-pay | Admitting: Physician Assistant

## 2021-03-23 ENCOUNTER — Ambulatory Visit (INDEPENDENT_AMBULATORY_CARE_PROVIDER_SITE_OTHER): Payer: Medicare Other | Admitting: Psychiatry

## 2021-03-23 ENCOUNTER — Other Ambulatory Visit: Payer: Self-pay

## 2021-03-23 DIAGNOSIS — F3132 Bipolar disorder, current episode depressed, moderate: Secondary | ICD-10-CM | POA: Diagnosis not present

## 2021-03-23 DIAGNOSIS — R41840 Attention and concentration deficit: Secondary | ICD-10-CM

## 2021-03-23 DIAGNOSIS — F4010 Social phobia, unspecified: Secondary | ICD-10-CM | POA: Diagnosis not present

## 2021-03-23 DIAGNOSIS — Z8669 Personal history of other diseases of the nervous system and sense organs: Secondary | ICD-10-CM | POA: Diagnosis not present

## 2021-03-23 DIAGNOSIS — F4310 Post-traumatic stress disorder, unspecified: Secondary | ICD-10-CM | POA: Diagnosis not present

## 2021-03-23 DIAGNOSIS — U099 Post covid-19 condition, unspecified: Secondary | ICD-10-CM
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
## 2021-03-23 NOTE — Chronic Care Management (AMB) (Signed)
°  Chronic Care Management   Note  03/23/2021 Name: Tiffany [name] MRN: [id number] DOB: Sep 15, 1950  Tiffany [name] is a 71 y.o. year old female who is a primary care patient of Inda Coke, [place]. I reached out to Pleas Koch Iman by phone today in response to a referral sent by Ms. [name]'s PCP, Inda Coke, [place].   Tiffany [name] was given information about Chronic Care Management services today including:  CCM service includes personalized support from designated clinical staff supervised by her physician, including individualized plan of care and coordination with other care providers 24/7 contact phone numbers for assistance for urgent and routine care needs. Service will only be billed when office clinical staff spend 20 minutes or more in a month to coordinate care. Only one practitioner may furnish and bill the service in a calendar month. The patient may stop CCM services at any time (effective at the end of the month) by phone call to the office staff.   Patient wishes to consider information provided and/or speak with a member of the care team before deciding about enrollment in care management services.   Follow up plan:PT [place]

## 2021-03-28 ENCOUNTER — Ambulatory Visit (HOSPITAL_BASED_OUTPATIENT_CLINIC_OR_DEPARTMENT_OTHER): Payer: Medicare Other | Admitting: Physical Therapy

## 2021-03-28 ENCOUNTER — Other Ambulatory Visit: Payer: Self-pay

## 2021-03-28 ENCOUNTER — Encounter (HOSPITAL_BASED_OUTPATIENT_CLINIC_OR_DEPARTMENT_OTHER): Payer: Self-pay | Admitting: Physical Therapy

## 2021-03-28 ENCOUNTER — Ambulatory Visit (INDEPENDENT_AMBULATORY_CARE_PROVIDER_SITE_OTHER): Payer: Medicare Other | Admitting: Adult Health

## 2021-03-28 ENCOUNTER — Encounter: Payer: Self-pay | Admitting: Adult Health

## 2021-03-28 ENCOUNTER — Ambulatory Visit: Payer: Medicare Other | Admitting: Adult Health

## 2021-03-28 DIAGNOSIS — F3132 Bipolar disorder, current episode depressed, moderate: Secondary | ICD-10-CM | POA: Diagnosis not present

## 2021-03-28 DIAGNOSIS — R2689 Other abnormalities of gait and mobility: Secondary | ICD-10-CM

## 2021-03-28 DIAGNOSIS — F4010 Social phobia, unspecified: Secondary | ICD-10-CM | POA: Diagnosis not present

## 2021-03-28 DIAGNOSIS — F4310 Post-traumatic stress disorder, unspecified: Secondary | ICD-10-CM | POA: Diagnosis not present

## 2021-03-28 DIAGNOSIS — R2681 Unsteadiness on feet: Secondary | ICD-10-CM

## 2021-03-28 DIAGNOSIS — M25652 Stiffness of left hip, not elsewhere classified: Secondary | ICD-10-CM

## 2021-03-28 DIAGNOSIS — F411 Generalized anxiety disorder: Secondary | ICD-10-CM | POA: Diagnosis not present

## 2021-03-28 DIAGNOSIS — M25552 Pain in left hip: Secondary | ICD-10-CM

## 2021-03-28 DIAGNOSIS — M6281 Muscle weakness (generalized): Secondary | ICD-10-CM

## 2021-03-28 MED ORDER — LORAZEPAM 1 MG PO TABS: ORAL_TABLET | ORAL | 2 refills | Status: DC

## 2021-03-28 MED ORDER — PRAZOSIN HCL 2 MG PO CAPS: ORAL_CAPSULE | ORAL | 3 refills | Status: DC

## 2021-03-28 NOTE — Progress Notes (Signed)
[name] [id number] 1950/05/10 71 y.o.  Subjective:   Patient ID:  Tiffany [name] is a 71 y.o. (DOB 17-May-1950) female.  Chief Complaint: No chief complaint on file.   HPI Tiffany [name] presents to the office today for follow-up of PTSD, insomnia, GAD, BPD 1.  Describes mood today as "ok". Pleasant. Tearful at times. Mood symptoms - reports depression, anxiety and irritable "on and off". Mood fluctuates. Stating "I'm doing ok today". Feels like current medications are helpful. Seeing Dr. Rica Mote weekly. Varying interest and motivation. Taking medications as prescribed. Energy levels vary. Active, does not have a regular exercise routine. Going to P/T twice a week. Enjoys some usual interests and activities. Married. Lives with husband their daughter. Talking to family and friends.  Appetite adequate. Weight loss.   Sleeps well most nights. Averages 9 to 10 hours - "broken sleep". Focus and concentration stable. Completing tasks. Managing some aspects of household. Retired.  Denies SI - "no plans or intents". Denies HI.  Denies AH Denies VH  Reports flashbacks daily - not debilitating.  Previous medications: Celexa, Zyprexa, Tegretol, Depakote, Serzone, Topamax, Seroquel, Effexor, Lexapro, Desipramine, Neurontin, Abilify, Geodon, Propanolol, Cymbalta, Cogentin, Trihexyphenadyl, Sinmmet, Provigil, Selegiline, Requip, Amantadine, Prozac, Mirapex, Azilect, Metoclopramide, Baclofen, Artane, Namenda, Latuda.    [place] Office Visit from 02/16/2021 in [place] Visit from 05/14/2019 in [place]  Total GAD-7 Score 13 [facility] Office Visit from 06/07/2019 in [place]  Total Score (max 30 points ) 29      PHQ2-9    [place] Visit from 02/16/2021 in [place] from 07/26/2020 in [place] Visit from 06/05/2020 in [place] from 03/16/2020 in [place] Visit from 09/16/2019 in [place]  PHQ-2 Total Score _0 0  PHQ-9 Total Score 6 -- 11 3 --      Flowsheet Row ED from 03/21/2021 in [facility] ED from 02/03/2021 in [facility] ED from 12/21/2020 in [facility]  C-SSRS RISK CATEGORY No Risk No Risk No Risk        Review of Systems:  Review of Systems  Musculoskeletal:  Negative for gait problem.  Neurological:  Negative for tremors.  Psychiatric/Behavioral:         Please refer to HPI   Medications: I have reviewed the patient's current medications.  Current Outpatient Medications  Medication Sig Dispense Refill   Certolizumab Pegol (CIMZIA) 2 X 200 MG KIT See admin instructions.     cholecalciferol (VITAMIN D3) 25 MCG (1000 UNIT) tablet Take 1,000 Units by mouth daily.     lamoTRIgine (LAMICTAL) 200 MG tablet Take 1 tablet (200 mg total) by mouth at bedtime. 90 tablet 3   levothyroxine (SYNTHROID) 112 MCG tablet Take 1 tablet (112 mcg total) by mouth daily before breakfast. 90 tablet 0   lithium carbonate 300 MG capsule TAKE 1 CAPSULE(300 MG) BY MOUTH AT BEDTIME 90 capsule 3   LORazepam (ATIVAN) 1 MG tablet TAKE 1 TABLET(1 MG) BY MOUTH TWICE DAILY AS NEEDED 60 tablet 2   methocarbamol (ROBAXIN) 500 MG tablet Take 1 tablet (500 mg total) by mouth 2 (two) times daily. 20 tablet 0   metroNIDAZOLE (METROGEL) 0.75 % gel Apply to face 1-2 times daily  45 g 0   prazosin (MINIPRESS) 2 MG capsule Take 1 to 2 capsules at bedtime. 180 capsule 3   sodium bicarbonate 650 MG tablet Take 650 mg by mouth daily.     triamcinolone cream (KENALOG) 0.1 % Apply 1 application topically 2 (two) times daily. (Patient not taking: Reported on 03/09/2021) 30 g 0   No current facility-administered medications  for this visit.    Medication Side Effects: None  Allergies:  Allergies  Allergen Reactions   Aripiprazole Other (See Comments)    Parkinsonism      Lactose Intolerance (Gi) Diarrhea   Methotrexate Other (See Comments)    Hair loss, severe stomatitis     Cefdinir Diarrhea    Other reaction(s): Diarrhea Yeast infection and fever; negative c diff   Etanercept Other (See Comments)    Headaches     Exemestane Other (See Comments)    Suicidal thoughts with medication     Fluoxetine Other (See Comments)    Parkinsonism   Lactose     Other reaction(s): diarrhea Other reaction(s): diarrhea Other reaction(s): diarrhea   Methylprednisolone Sodium Succ Other (See Comments)    Agitated mania   Epinephrine Palpitations    tachycardia    Nitrofurantoin Nausea And Vomiting and Rash      Other reaction(s): rash, diarrhea Other reaction(s): rash, diarrhea Other reaction(s): rash, diarrhea Other reaction(s): rash, diarrhea    Past Medical History:  Diagnosis Date   Bipolar 1 disorder (HCC)    Breast cancer (HCC)    Chronic diarrhea    loose stools twice a day on average for years   Chronic kidney disease (CKD), stage III (moderate) ([place])    Depression 1987   Hospitalization or health care facility admission within last 6 months 04/2019   for fall/seizures   Malignant neoplasm of overlapping sites of left breast in female, estrogen receptor positive ([place]) 03/14/2016   Dx in 09/2014, s/p bilateral mastectomies and ALND, 0/10 LN. 1.4 cm  Grade I invasive lobular, ER and PR +/Her--, Ki 67 <5% Tried anastrozole for one month, but developed suicidal idea   Osteoarthritis, knee 09/24/2019   Xray 09/2019   Parkinson's disease ([place]) 2012   Psoriatic arthritis ([place])    PTSD (post-traumatic stress disorder)    Secondary hyperparathyroidism ([place])    Tardive dyskinesia     Past Medical History, Surgical history, Social history, and Family history were reviewed and updated as  appropriate.   Please see review of systems for further details on the patient's review from today.   Objective:   Physical Exam:  There were no vitals taken for this visit.  Physical Exam Constitutional:      General: She is not in acute distress. Musculoskeletal:        General: No deformity.  Neurological:     Mental Status: She is alert and oriented to person, place, and time.     Coordination: Coordination normal.  Psychiatric:        Attention and Perception: Attention and perception normal. She does not perceive auditory or visual hallucinations.        Mood and Affect: Mood normal. Mood is not anxious or depressed. Affect is not labile, blunt, angry or inappropriate.        Speech: Speech normal.        Behavior: Behavior normal.        Thought Content: Thought content normal. Thought content is not paranoid or delusional. Thought content does not include homicidal or suicidal  ideation. Thought content does not include homicidal or suicidal plan.        Cognition and Memory: Cognition and memory normal.        Judgment: Judgment normal.     Comments: Insight intact    Lab Review:     Component Value Date/Time   NA 142 03/21/2021 1123   NA 138 06/08/2019 0000   K 4.0 03/21/2021 1123   CL 109 03/21/2021 1123   CO2 24 03/21/2021 1123   GLUCOSE 83 03/21/2021 1123   BUN 30 (H) 03/21/2021 1123   BUN 29 (A) 06/08/2019 0000   CREATININE 2.28 (H) 03/21/2021 1123   CREATININE 2.03 (H) 02/02/2021 1012   CALCIUM 9.7 03/21/2021 1123   PROT 7.8 03/21/2021 1123   ALBUMIN 4.5 03/21/2021 1123   AST 15 03/21/2021 1123   ALT 12 03/21/2021 1123   ALKPHOS 106 03/21/2021 1123   BILITOT 1.2 03/21/2021 1123   GFRNONAA 22 (L) 03/21/2021 1123   GFRAA 25 (L) 09/13/2019 1243       Component Value Date/Time   WBC 8.7 03/21/2021 1123   RBC 4.64 03/21/2021 1123   HGB 13.5 03/21/2021 1123   HCT 43.9 03/21/2021 1123   PLT 225 03/21/2021 1123   MCV 94.6 03/21/2021 1123   MCH 29.1  03/21/2021 1123   MCHC 30.8 03/21/2021 1123   RDW 14.2 03/21/2021 1123   LYMPHSABS 3.1 03/21/2021 1123   MONOABS 0.5 03/21/2021 1123   EOSABS 0.3 03/21/2021 1123   BASOSABS 0.1 03/21/2021 1123    Lithium Lvl  Date Value Ref Range Status  02/02/2021 0.6 0.6 - 1.2 mmol/L Final     No results found for: PHENYTOIN, PHENOBARB, VALPROATE, CBMZ   .res Assessment: Plan:     Plan:  Therapist - Andy Mitchum   Lorazepam 54m BID for anxiety - make take one tablet extra for severe anxiety symptoms. Taking one at night routinely. Lamictal 2069mat hs Lithium 30022maily  Prazosin 5mg16m bedtime - decreased from 6mg 20mhs - dose increased 3 days ago.  Labs - lithium level, TSH, CMP - February appt.  RTC 4/6 weeks  Counseled patient regarding potential benefits, risks, and side effects of Lamictal to include potential risk of Stevens-Johnson syndrome. Advised patient to stop taking Lamictal and contact office immediately if rash develops and to seek urgent medical attention if rash is severe and/or spreading quickly.  Discussed potential benefits, risk, and side effects of benzodiazepines to include potential risk of tolerance and dependence, as well as possible drowsiness.  Advised patient not to drive if experiencing drowsiness and to take lowest possible effective dose to minimize risk of dependence and tolerance.   Diagnoses and all orders for this visit:  Social anxiety disorder  PTSD (post-traumatic stress disorder) -     prazosin (MINIPRESS) 2 MG capsule; Take 1 to 2 capsules at bedtime.  Generalized anxiety disorder -     LORazepam (ATIVAN) 1 MG tablet; TAKE 1 TABLET(1 MG) BY MOUTH TWICE DAILY AS NEEDED  Bipolar I disorder, most recent episode depressed, moderate (HCC) [place] Please see After Visit Summary for patient specific instructions.  Future Appointments  Date Time Provider DeparGrimes0/2023 11:00 AM MitchBlanchie Serve CP-CP None  04/02/2021  1:00 PM  [facility] HEALTH COACH [facility] PEC  04/04/2021 11:45 AM CarroCarney LivingDWB-REH DWB  04/06/2021  9:45 AM ZiembVedia PereyraDWB-REH DWB  04/06/2021  2:00 PM MitchBlanchie Serve CP-CP None  04/09/2021 11:00 AM Inda Coke, PA [facility] PEC  04/11/2021 11:45 AM Carney Living, PT DWB-REH DWB  04/13/2021  9:45 AM Vedia Pereyra, PT DWB-REH DWB  04/13/2021  2:00 PM [name], PhD CP-CP None  04/16/2021 12:00 PM Vedia Pereyra, PT DWB-RE[facility] DWB  04/18/2021 11:45 AM Carney Living, PT DWB[facility]RE[facility] DWB  04/20/2021  2:00 PM [name], PhD CP-CP None  04/23/2021 11:15 AM Vedia Pereyra, PT DWB-RE[facility] DWB  04/25/2021 11:40 AM Delila Kuklinski, Berdie Ogren, NP CP-CP None  04/26/2021 11:00 AM Carney Living, PT DW[facility]-RE[facility] DWB  04/26/2021  4:00 PM [name], PhD CP-CP None  04/30/2021 11:15 AM Vedia Pereyra, PT DWB-REH DWB  05/02/2021 11:45 AM Carney Living, PT DWB-REH DWB  05/07/2021 11:15 AM Vedia Pereyra, PT DW[facility]-RE[facility] DWB  05/08/2021 11:00 AM [name], PhD CP-CP None  05/09/2021 11:45 AM Carney Living, PT DWB-REH DWB    No orders of the defined types were placed in this encounter.   -------------------------------

## 2021-03-28 NOTE — Therapy (Signed)
[employer] [address] [place], [name], [address] Phone: [phone]   Fax:  [phone]  Physical Therapy Treatment  Patient Details  Name: Tiffany Hearns MRN: [id number] Date of Birth: Jul 24, 1950 Referring Provider (PT): Inda Coke, [place]   Encounter Date: 03/28/2021   PT End of Session - 03/28/21 1014     Visit Number 13    Number of Visits 21    Date for PT Re-Evaluation 04/17/21    Authorization Type Medicare progress not perfromed on visit 9; next visit at 70    PT Start Time 0930    PT Stop Time 1013    PT Time Calculation (min) 43 min    Activity Tolerance Patient tolerated treatment well    Behavior During Therapy [facility] for tasks assessed/performed             Past Medical History:  Diagnosis Date   Bipolar 1 disorder ([place])    Breast cancer ([place])    Chronic diarrhea    loose stools twice a day on average for years   Chronic kidney disease (CKD), stage III (moderate) ([place])    Depression 1987   Hospitalization or health care facility admission within last 6 months 04/2019   for fall/seizures   Malignant neoplasm of overlapping sites of left breast in female, estrogen receptor positive ([place]) 03/14/2016   Dx in 09/2014, s/p bilateral mastectomies and ALND, 0/10 LN. 1.4 cm  Grade I invasive lobular, ER and PR +/Her--, Ki 67 <5% Tried anastrozole for one month, but developed suicidal idea   Osteoarthritis, knee 09/24/2019   Xray 09/2019   Parkinson's disease ([place]) 2012   Psoriatic arthritis ([place])    PTSD (post-traumatic stress disorder)    Secondary hyperparathyroidism ([place])    Tardive dyskinesia     Past Surgical History:  Procedure Laterality Date   [place] Bilateral 09/19/2014   TONSILLECTOMY  1970   URETERAL REIMPLANTION Bilateral 1974    There were no vitals filed for this visit.   Subjective  Assessment - 03/28/21 0940     Subjective Patient was seen on 1/11 at the [facility] ED for chest pain> She reports it is more of a focal chest pain on the right side. She had several tests that were inconclusinve She comes in today with significant back and leg pain. She is intrested to see if light activity can help that.    Limitations Standing;Walking;House hold activities    How long can you stand comfortably? Could be immediate or an hour. For sure after an hour.    How long can you walk comfortably? has to use a device for limited distances    Patient Stated Goals Getting stronger, improving balance, pain free    Currently in Pain? Yes    Pain Score 8     Pain Location Back    Pain Orientation Right;Left    Pain Descriptors / Indicators Aching    Pain Type Acute pain    Pain Onset More than a month ago    Pain Frequency Intermittent    Aggravating Factors  just painful    Pain Relieving Factors light stretching and movement    Multiple Pain Sites No                  LTR: x20 each way Seated ball roll and stretch forward and lateral  M[place]ul: [place] Knee extension stretching   Knee: quad set 3x10  SAQ 3x10   Seated hip andcution 3x10;   Shoulder AAROM for stretch 3x10                        PT Education - 03/28/21 0944     Education Details reviewed light stretching activity    Person(s) Educated Patient    Methods Explanation;Demonstration;Tactile cues;Verbal cues    Comprehension Verbalized understanding;Verbal cues required;Returned demonstration;Tactile cues required              PT Short Term Goals - 03/07/21 0835       PT SHORT TERM GOAL #1   Title Pt will improve tug with rollater below 14 secs to show improvement in ambulation.    Baseline 14 seconds 12/28    Time 3    Period Weeks    Status Achieved      PT SHORT TERM GOAL #2   Title Patient will improve strength for bilateral hip flexion to 5/5.    Baseline tested  with hand dyno today    Time 3    Period Weeks    Status On-going      PT SHORT TERM GOAL #3   Title Patient will performed 5x sit to stand below 10  secs to show improvement is functional strenght.    Baseline 16 sec    Time 4    Period Weeks    Status On-going    Target Date 10/04/20      PT SHORT TERM GOAL #4   Title Pt will improve 5x sit<>stand to less than or equal to 18 seconds for improved functional strength.    Baseline 16.93 sec 06/30/2019    Time 4    Period Weeks    Status On-going      PT SHORT TERM GOAL #5   Title Pt/husband will verbalize understanding of fall prevention in home environment.    Time 4    Period Weeks    Status On-going               PT Long Term Goals - 01/15/21 1409       PT LONG TERM GOAL #1   Title Pt will be be able to perform six minute walk test and ambulate 700 feet without a device or with a less restricted device such as a cane independently in order to ambualte in the community    Time 8    Period Weeks    Status On-going      PT LONG TERM GOAL #2   Title Pt will go up and down 6 steps without increased pain    Baseline 3 steps    Time 6    Period Weeks    Status New      PT LONG TERM GOAL #3   Title Patient will be independent with pool program and general strengthening program in order to continue to progress mobility    Time 6    Period Weeks    Status On-going                   Plan - 03/28/21 1537     Clinical Impression Statement The patient reported that she would like to try some ligh activity to see if it improved her pain in her back and legs. Therapy foccsued on light stretching movements o loosen her bck and legs. She reported no significant  pain durning treatment. Upon sistting from a supine position she had some pain in her chest. She reported it improved once she sat for a little bit. The therapy session was ended at that time. Later in the day the patient called stating that she was having  more pain and symptoms. Her MD was contacted and notified. She was advised if she has any shortness of breath to return to the ED or any increase in pain.    Personal Factors and Comorbidities Fitness;Time since onset of injury/illness/exacerbation;Comorbidity 3+    Comorbidities multiple pain locations, back, hip, knee, Decreased mobility post covid and hospitalization last year,    Examination-Activity Limitations Stand;Locomotion Level;Bend;Dressing;Squat;Stairs    Examination-Participation Restrictions Cleaning;Meal Prep;Yard Work;Community Activity;Shop;Laundry    Stability/Clinical Decision Making Evolving/Moderate complexity    Clinical Decision Making Moderate    Rehab Potential Good    PT Frequency 2x / week    PT Duration 6 weeks    PT Treatment/Interventions ADLs/Self Care Home Management;Cryotherapy;Electrical Stimulation;Iontophoresis 4mg /ml Dexamethasone;Ultrasound;DME Instruction;Gait training;Stair training;Therapeutic activities;Therapeutic exercise;Neuromuscular re-education;Patient/family education;Manual techniques;Passive range of motion;Dry needling;Taping    PT Home Exercise Plan Access Code: [facility]  URL: [email or web address]  Date: 09/06/2020  Prepared by: Carolyne Littles    Exercises  Seated Knee Extension with Resistance - 1 x daily - 7 x weekly - 3 sets - 10 reps  Seated March with Resistance - 1 x daily - 7 x weekly - 3 sets - 10 reps  Seated Hip Abduction with Resistance - 1 x daily - 7 x weekly - 3 sets - 10 reps    Consulted and Agree with Plan of Care Patient             Patient will benefit from skilled therapeutic intervention in order to improve the following deficits and impairments:  Abnormal gait, Pain, Increased muscle spasms, Decreased activity tolerance, Decreased endurance, Decreased range of motion, Decreased strength, Impaired flexibility, Difficulty walking, Decreased balance, Decreased safety awareness  Visit Diagnosis: Other  abnormalities of gait and mobility  Unsteadiness on feet  Muscle weakness (generalized)  Pain in left hip  Stiffness of left hip, not elsewhere classified     Problem List Patient Active Problem List   Diagnosis Date Noted   Osteoarthritis, knee 09/24/2019   CKD (chronic kidney disease) stage 4, GFR 15-29 ml/min ([place]) 09/24/2019   Severe recurrent major depression without psychotic features ([place]) 09/09/2019   Bipolar I disorder, most recent episode depressed ([place])    Transaminitis    Essential hypertension    Encephalopathy 04/26/2019   Leukocytosis 04/21/2019   Thrombocytopenia ([place]) 09/04/2018   Vitamin D deficiency 09/04/2018   Psoriatic arthritis ([place]) 09/04/2018   Tubular adenoma of colon 05/28/2017   History of colonic polyps 05/28/2017   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 [place], [name], [address] Phone: [phone]   Fax:  [phone]  Name: Tiffany [name] MRN: [id number] Date of Birth: [birth year]

## 2021-03-30 ENCOUNTER — Other Ambulatory Visit: Payer: Self-pay

## 2021-03-30 ENCOUNTER — Ambulatory Visit (INDEPENDENT_AMBULATORY_CARE_PROVIDER_SITE_OTHER): Payer: Medicare Other | Admitting: Psychiatry

## 2021-03-30 DIAGNOSIS — F411 Generalized anxiety disorder: Secondary | ICD-10-CM

## 2021-03-30 DIAGNOSIS — U099 Post covid-19 condition, unspecified: Secondary | ICD-10-CM

## 2021-03-30 DIAGNOSIS — F4310 Post-traumatic stress disorder, unspecified: Secondary | ICD-10-CM

## 2021-03-30 DIAGNOSIS — F3132 Bipolar disorder, current episode depressed, moderate: Secondary | ICD-10-CM | POA: Diagnosis not present

## 2021-03-30 DIAGNOSIS — R41840 Attention and concentration deficit: Secondary | ICD-10-CM

## 2021-03-30 DIAGNOSIS — Z8669 Personal history of other diseases of the nervous system and sense organs: Secondary | ICD-10-CM | POA: Diagnosis not present

## 2021-03-30 DIAGNOSIS — N184 Chronic kidney disease, stage 4 (severe): Secondary | ICD-10-CM

## 2021-03-30 NOTE — Progress Notes (Signed)
Psychotherapy Progress Note [facility] Marliss Czar, PhD LP  Patient ID: Tiffany [name])    MRN: [id number] Therapy format: Family therapy w/ patient -- accompanied by Tonia Brooms Date: 03/30/2021      Start: 11:10a     Stop: 12:00n     Time Spent: 50 min Location: In-person   Session narrative (presenting needs, interim history, self-report of stressors and symptoms, applications of prior therapy, status changes, and interventions made in session) Off walker today.  Koren Bound here with her today.  Water exercise is helping fast.   Contrary to self-report of Koren Bound is seeking guidance on how to help her handle anxiety.  Recommended sheer empathy, emotional recognition, first.  Also discussed grounding skills -- hold ice cube, recall phone number, go outside, walk.  Has been having some delayed effects of revelations.  Offered   Went down on prazosin yesterday from 5mg  to 4mg .  (6mg  had sparked hallucinations.)  Not clear why, but med note only shows 2mg  supply with clearance to take 1 or 2 caps at discretion.  PCP already weaned amlodipine to compensate re. BP strategy and has her checking BP qAM.  Suggested she monitor daily her flashback and daymare experience as well as BPs to best gauge whether med is helpful or importantly losing effectiveness, as an alpha blocker treats both problems.  Psychosocially, Nicholos Johns under tremendous stress now with her school experience.  Still nightly experience of feeling stuck listening to her vent about it.  Processed a telling moment where Nicholos Johns reacted to [place] trying to offer advice.  Discussed possibilities for modifying her habit of venting every night for dinner, which is becoming wearing.  Continued to advise Koren Bound to be mindful of his approach in emotional support, and to ask before switching from Makailyn's emotions to a suggestion he might make.  Therapeutic modalities: Cognitive Behavioral Therapy and Solution-Oriented/Positive  Psychology  Mental Status/Observations:  Appearance:   Casual     Behavior:  Appropriate  Motor:  Inhibited gait, improving  Speech/Language:   Clear and Coherent  Affect:  Appropriate and nonlabile  Mood:  anxious  Thought process:  normal  Thought content:    Worry, some intrusive  memory  Sensory/Perceptual disturbances:    WNL  Orientation:  Fully oriented  Attention:  Good    Concentration:  Good  Memory:  WNL  Insight:    Fair  Judgment:   Good  Impulse Control:  Good   Risk Assessment: Danger to Self: No Self-injurious Behavior: No Danger to Others: No Physical Aggression / Violence: No Duty to Warn: No Access to Firearms a concern: No  Assessment of progress:  progressing  Diagnosis:   ICD-10-CM   1. PTSD (post-traumatic stress disorder)  F43.10     2. Generalized anxiety disorder  F41.1     3. Bipolar I disorder, most recent episode depressed, moderate (HCC)  F31.32     4. History of metabolic encephalopathy  Z86.69     5. History of small, chronic left frontal infarct, as assessed by neurologist  Z86.69     6. 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 CKD (chronic kidney disease) stage 4, GFR 15-29 ml/min (HCC) by history  N18.4      Plan:  Clarify purposes in reducing prazosin -- seemed to be optimally helpful at 5mg  Suggestions to [facility] for responding helpfully to anxiety and emotional distress Suggestions  for gently modifying [name]'s monologuing habit Continue with prior recommendations for worry control, acceptance of intrusive memories, and sleep readiness/acceptance Continue helpful PT Other recommendations/advice as may be noted above Continue to utilize previously learned skills ad lib Maintain medication as prescribed and work faithfully with relevant prescriber(s) if any changes are desired or seem indicated Call the clinic on-call service, 988/hotline, 911, or present to  [facility] or ER if any life-threatening psychiatric crisis No follow-ups on file. Already scheduled visit in this office 04/06/2021.  Robley Fries, PhD Marliss Czar, PhD LP Clinical Psychologist, [facility] [address], [address] [place], [place] [address] [phone]

## 2021-04-02 ENCOUNTER — Other Ambulatory Visit: Payer: Self-pay

## 2021-04-02 ENCOUNTER — Ambulatory Visit (INDEPENDENT_AMBULATORY_CARE_PROVIDER_SITE_OTHER): Payer: Medicare Other

## 2021-04-02 DIAGNOSIS — Z0000 Encounter for general adult medical examination without abnormal findings: Secondary | ICD-10-CM

## 2021-04-02 NOTE — Patient Instructions (Signed)
Tiffany [name] , Thank you for taking time to come for your Medicare Wellness Visit. I appreciate your ongoing commitment to your health goals. Please review the following plan we discussed and let me know if I can assist you in the future.   Screening recommendations/referrals: Colonoscopy: Done 03/11/17 repeat every 10 years Bone density: 11/26/19 repeat every 2 years  Recommended yearly ophthalmology/optometry visit for glaucoma screening and checkup Recommended yearly dental visit for hygiene and checkup  Vaccinations: Influenza vaccine: Done 11/29/20 repeat every year  Pneumococcal vaccine: Up to date Tdap vaccine: Done 05/26/15 repeat every 10 years  Shingles vaccine: postponed until 3/23   Covid-19:Completed 4/3.0, 5/24, 11/09/19 & 01/23/21  Advanced directives: Copies in chart   Conditions/risks identified: continue working with PT   Next appointment: Follow up in one year for your annual wellness visit    Preventive Care 65 Years and Older, Female Preventive care refers to lifestyle choices and visits with your health care provider that can promote health and wellness. What does preventive care include? A yearly physical exam. This is also called an annual well check. Dental exams once or twice a year. Routine eye exams. Ask your health care provider how often you should have your eyes checked. Personal lifestyle choices, including: Daily care of your teeth and gums. Regular physical activity. Eating a healthy diet. Avoiding tobacco and drug use. Limiting alcohol use. Practicing safe sex. Taking low-dose aspirin every day. Taking vitamin and mineral supplements as recommended by your health care provider. What happens during an annual well check? The services and screenings done by your health care provider during your annual well check will depend on your age, overall health, lifestyle risk factors, and family history of disease. Counseling  Your health care provider may ask  you questions about your: Alcohol use. Tobacco use. Drug use. Emotional well-being. Home and relationship well-being. Sexual activity. Eating habits. History of falls. Memory and ability to understand (cognition). Work and work Statistician. Reproductive health. Screening  You may have the following tests or measurements: Height, weight, and BMI. Blood pressure. Lipid and cholesterol levels. These may be checked every 5 years, or more frequently if you are over 45 years old. Skin check. Lung cancer screening. You may have this screening every year starting at age 83 if you have a 30-pack-year history of smoking and currently smoke or have quit within the past 15 years. Fecal occult blood test (FOBT) of the stool. You may have this test every year starting at age 30. Flexible sigmoidoscopy or colonoscopy. You may have a sigmoidoscopy every 5 years or a colonoscopy every 10 years starting at age 34. Hepatitis C blood test. Hepatitis B blood test. Sexually transmitted disease (STD) testing. Diabetes screening. This is done by checking your blood sugar (glucose) after you have not eaten for a while (fasting). You may have this done every 1-3 years. Bone density scan. This is done to screen for osteoporosis. You may have this done starting at age 13. Mammogram. This may be done every 1-2 years. Talk to your health care provider about how often you should have regular mammograms. Talk with your health care provider about your test results, treatment options, and if necessary, the need for more tests. Vaccines  Your health care provider may recommend certain vaccines, such as: Influenza vaccine. This is recommended every year. Tetanus, diphtheria, and acellular pertussis (Tdap, Td) vaccine. You may need a Td booster every 10 years. Zoster vaccine. You may need this after age 17.  Pneumococcal 13-valent conjugate (PCV13) vaccine. One dose is recommended after age 54. Pneumococcal  polysaccharide (PPSV23) vaccine. One dose is recommended after age 48. Talk to your health care provider about which screenings and vaccines you need and how often you need them. This information is not intended to replace advice given to you by your health care provider. Make sure you discuss any questions you have with your health care provider. Document Released: 03/24/2015 Document Revised: 11/15/2015 Document Reviewed: 12/27/2014 Elsevier Interactive Patient Education  2017 [place] Prevention in the Home Falls can cause injuries. They can happen to people of all ages. There are many things you can do to make your home safe and to help prevent falls. What can I do on the outside of my home? Regularly fix the edges of walkways and driveways and fix any cracks. Remove anything that might make you trip as you walk through a door, such as a raised step or threshold. Trim any bushes or trees on the path to your home. Use bright outdoor lighting. Clear any walking paths of anything that might make someone trip, such as rocks or tools. Regularly check to see if handrails are loose or broken. Make sure that both sides of any steps have handrails. Any raised decks and porches should have guardrails on the edges. Have any leaves, snow, or ice cleared regularly. Use sand or salt on walking paths during winter. Clean up any spills in your garage right away. This includes oil or grease spills. What can I do in the bathroom? Use night lights. Install grab bars by the toilet and in the tub and shower. Do not use towel bars as grab bars. Use non-skid mats or decals in the tub or shower. If you need to sit down in the shower, use a plastic, non-slip stool. Keep the floor dry. Clean up any water that spills on the floor as soon as it happens. Remove soap buildup in the tub or shower regularly. Attach bath mats securely with double-sided non-slip rug tape. Do not have throw rugs and other  things on the floor that can make you trip. What can I do in the bedroom? Use night lights. Make sure that you have a light by your bed that is easy to reach. Do not use any sheets or blankets that are too big for your bed. They should not hang down onto the floor. Have a firm chair that has side arms. You can use this for support while you get dressed. Do not have throw rugs and other things on the floor that can make you trip. What can I do in the kitchen? Clean up any spills right away. Avoid walking on wet floors. Keep items that you use a lot in easy-to-reach places. If you need to reach something above you, use a strong step stool that has a grab bar. Keep electrical cords out of the way. Do not use floor polish or wax that makes floors slippery. If you must use wax, use non-skid floor wax. Do not have throw rugs and other things on the floor that can make you trip. What can I do with my stairs? Do not leave any items on the stairs. Make sure that there are handrails on both sides of the stairs and use them. Fix handrails that are broken or loose. Make sure that handrails are as long as the stairways. Check any carpeting to make sure that it is firmly attached to the stairs. Fix any  carpet that is loose or worn. Avoid having throw rugs at the top or bottom of the stairs. If you do have throw rugs, attach them to the floor with carpet tape. Make sure that you have a light switch at the top of the stairs and the bottom of the stairs. If you do not have them, ask someone to add them for you. What else can I do to help prevent falls? Wear shoes that: Do not have high heels. Have rubber bottoms. Are comfortable and fit you well. Are closed at the toe. Do not wear sandals. If you use a stepladder: Make sure that it is fully opened. Do not climb a closed stepladder. Make sure that both sides of the stepladder are locked into place. Ask someone to hold it for you, if possible. Clearly  mark and make sure that you can see: Any grab bars or handrails. First and last steps. Where the edge of each step is. Use tools that help you move around (mobility aids) if they are needed. These include: Canes. Walkers. Scooters. Crutches. Turn on the lights when you go into a dark area. Replace any light bulbs as soon as they burn out. Set up your furniture so you have a clear path. Avoid moving your furniture around. If any of your floors are uneven, fix them. If there are any pets around you, be aware of where they are. Review your medicines with your doctor. Some medicines can make you feel dizzy. This can increase your chance of falling. Ask your doctor what other things that you can do to help prevent falls. This information is not intended to replace advice given to you by your health care provider. Make sure you discuss any questions you have with your health care provider. Document Released: 12/22/2008 Document Revised: 08/03/2015 Document Reviewed: 04/01/2014 Elsevier Interactive Patient Education  2017 [employer].

## 2021-04-02 NOTE — Progress Notes (Addendum)
Virtual Visit via Telephone Note  I connected with  Docie Abramovich Shepheard on 04/02/21 at  1:00 PM EST by telephone and verified that I am speaking with the correct person using two identifiers.  Medicare Annual Wellness visit completed telephonically due to Covid-19 pandemic.   Persons participating in this call: This Health Coach and this patient.   Location: Patient: Home Provider: Office   I discussed the limitations, risks, security and privacy concerns of performing an evaluation and management service by telephone and the availability of in person appointments. The patient expressed understanding and agreed to proceed.  Unable to perform video visit due to video visit attempted and failed and/or patient does not have video capability.   Some vital signs may be absent or patient reported.   Willette Brace, LPN   Subjective:   Lilas Diefendorf Bound is a 71 y.o. female who presents for Medicare Annual (Subsequent) preventive examination.  Review of Systems     Cardiac Risk Factors include: advanced age (>27mn, >>53women);hypertension;obesity (BMI >30kg/m2)     Objective:    Today's Vitals   04/02/21 1257  PainSc: 7    There is no height or weight on file to calculate BMI.  Advanced Directives 04/02/2021 12/21/2020 09/05/2020 08/01/2020 04/21/2020 03/16/2020 03/07/2020  Does Patient Have a Medical Advance Directive? Yes Yes Yes Yes Yes Yes No  Type of AParamedicof AFort LuptonLiving will HAvenue B and CLiving will HA[place] of facility DNR (pink MOST or yellow form);Living will HSebastianLiving will HArtasLiving will -  Does patient want to make changes to medical advance directive? - - - - - - -  Copy of [place]in Chart? Yes - validated most recent copy scanned in chart (See row information) - No - copy requested - No - copy requested No  - copy requested -  Would patient like information on creating a medical advance directive? - - - - - - No - Patient declined  Some encounter information is confidential and restricted. Go to Review Flowsheets activity to see all data.    Current Medications (verified) Outpatient Encounter Medications as of 04/02/2021  Medication Sig   Certolizumab Pegol (CIMZIA) 2 X 200 MG KIT See admin instructions.   cholecalciferol (VITAMIN D3) 25 MCG (1000 UNIT) tablet Take 1,000 Units by mouth daily.   lamoTRIgine (LAMICTAL) 200 MG tablet Take 1 tablet (200 mg total) by mouth at bedtime.   levothyroxine (SYNTHROID) 112 MCG tablet Take 1 tablet (112 mcg total) by mouth daily before breakfast.   lithium carbonate 300 MG capsule TAKE 1 CAPSULE(300 MG) BY MOUTH AT BEDTIME   LORazepam (ATIVAN) 1 MG tablet TAKE 1 TABLET(1 MG) BY MOUTH TWICE DAILY AS NEEDED   metroNIDAZOLE (METROGEL) 0.75 % gel Apply to face 1-2 times daily   prazosin (MINIPRESS) 2 MG capsule Take 1 to 2 capsules at bedtime.   sodium bicarbonate 650 MG tablet Take 650 mg by mouth daily.   [DISCONTINUED] methocarbamol (ROBAXIN) 500 MG tablet Take 1 tablet (500 mg total) by mouth 2 (two) times daily. (Patient not taking: Reported on 04/02/2021)   [DISCONTINUED] triamcinolone cream (KENALOG) 0.1 % Apply 1 application topically 2 (two) times daily. (Patient not taking: Reported on 03/09/2021)   No facility-administered encounter medications on file as of 04/02/2021.    Allergies (verified) Aripiprazole, Lactose intolerance (gi), Methotrexate, Cefdinir, Etanercept, Exemestane, Fluoxetine, Lactose, Methylprednisolone sodium succ, Epinephrine, and  Nitrofurantoin   History: Past Medical History:  Diagnosis Date   Bipolar 1 disorder ([place])    Breast cancer ([place])    Chronic diarrhea    loose stools twice a day on average for years   Chronic kidney disease (CKD), stage III (moderate) ([place])    Depression 1987   Hospitalization or health care  facility admission within last 6 months 04/2019   for fall/seizures   Malignant neoplasm of overlapping sites of left breast in female, estrogen receptor positive ([place]) 03/14/2016   Dx in 09/2014, s/p bilateral mastectomies and ALND, 0/10 LN. 1.4 cm  Grade I invasive lobular, ER and PR +/Her--, Ki 67 <5% Tried anastrozole for one month, but developed suicidal idea   Osteoarthritis, knee 09/24/2019   Xray 09/2019   Parkinson's disease ([place]) 2012   Psoriatic arthritis ([place])    PTSD (post-traumatic stress disorder)    Secondary hyperparathyroidism ([place])    Tardive dyskinesia    Past Surgical History:  Procedure Laterality Date   ABDOMINAL HYSTERECTOMY  [place]   MASTECTOMY Bilateral 09/19/2014   TONSILLECTOMY  1970   URETERAL REIMPLANTION Bilateral 1974   Family History  Problem Relation Age of Onset   Cancer Mother    Cancer Sister    Stroke Maternal Grandfather    Diabetes Paternal Grandfather    Cancer Sister    Social History   Socioeconomic History   Marital status: Married    Spouse name: Not on file   Number of children: Not on file   Years of education: Not on file   Highest education level: Not on file  Occupational History   Occupation: retired  Tobacco Use   Smoking status: Never   Smokeless tobacco: Never  Vaping Use   Vaping Use: Never used  Substance and Sexual Activity   Alcohol use: Never   Drug use: Never   Sexual activity: Not Currently  Other Topics Concern   Not on file  Social History Narrative   Moved to area from [place] 08/2018   Lives one story home   Right handed.   Social Determinants of Health   Financial Resource Strain: Low Risk    Difficulty of Paying Living Expenses: Not hard at all  Food Insecurity: No Food Insecurity   Worried About Charity fundraiser in the Last Year: Never true   [place] in the Last Year: Never true  Transportation Needs: No Transportation  Needs   Lack of Transportation (Medical): No   Lack of Transportation (Non-Medical): No  Physical Activity: Insufficiently Active   Days of Exercise per Week: 2 days   Minutes of Exercise per Session: 50 min  Stress: No Stress Concern Present   Feeling of Stress : Not at all  Social Connections: Moderately Integrated   Frequency of Communication with Friends and Family: Three times a week   Frequency of Social Gatherings with Friends and Family: Three times a week   Attends Religious Services: More than 4 times per year   Active Member of Clubs or Organizations: No   Attends Archivist Meetings: Never   Marital Status: Married    Tobacco Counseling Counseling given: Not Answered   Clinical Intake:  Pre-visit preparation completed: Yes  Pain : 0-10 Pain Score: 7  Pain Type: Chronic pain Pain Location: Generalized Pain Descriptors / Indicators: Aching Pain Onset: More than a month ago Pain Frequency: Constant  BMI - recorded: 36.09 Nutritional Status: BMI > 30  Obese Nutritional Risks: None Diabetes: No  How often do you need to have someone help you when you read instructions, pamphlets, or other written materials from your doctor or pharmacy?: 1 - Never  Diabetic?no  Interpreter Needed?: No  Information entered by :: Charlott Rakes, LPN   Activities of Daily Living In your present state of health, do you have any difficulty performing the following activities: 04/02/2021  Hearing? N  Vision? N  Difficulty concentrating or making decisions? N  Walking or climbing stairs? N  Dressing or bathing? N  Doing errands, shopping? N  Preparing Food and eating ? N  Using the Toilet? N  In the past six months, have you accidently leaked urine? N  Do you have problems with loss of bowel control? N  Managing your Medications? N  Managing your Finances? N  Housekeeping or managing your Housekeeping? N  Some recent data might be hidden    Patient Care  Team: Inda Coke, [place] as PCP - General (Physician Assistant) [place], Berdie Ogren, NP as Nurse Practitioner (Psychiatry) Thornton Park, MD as Consulting Physician (Gastroenterology) Star Age, MD as Consulting Physician (Neurology) Rheumatology, [facility] as Consulting Physician (Rheumatology) Cameron Sprang, MD as Consulting Physician (Neurology) Associates, [facility] as Consulting Physician (Nephrology) Cameron Sprang, MD as Consulting Physician (Neurology)  Indicate any recent Medical Services you may have received from other than [facility] providers in the past year (date may be approximate).     Assessment:   This is a routine wellness examination for [place].  Hearing/Vision screen Hearing Screening - Comments:: Pt denies any hearing issues  Vision Screening - Comments:: [facility]   Dietary issues and exercise activities discussed: Current Exercise Habits: Structured exercise class, Type of exercise: Other - see comments (PT), Time (Minutes): 45, Frequency (Times/Week): 2, Weekly Exercise (Minutes/Week): 90   Goals Addressed             This Visit's Progress    Patient Stated       Continue Working with PT        Depression Screen PHQ 2/9 Scores 04/02/2021 02/16/2021 06/05/2020 03/16/2020 09/16/2019 08/24/2019 05/14/2019  PHQ - 2 Score 1 3 4 2  0 0 1  PHQ- 9 Score 1 6 11 3  - 4 8  Some encounter information is confidential and restricted. Go to Review Flowsheets activity to see all data.    Fall Risk Fall Risk  04/02/2021 03/09/2021 08/01/2020 06/05/2020 03/16/2020  Falls in the past year? 0 0 0 0 1  Number falls in past yr: 0 - 0 0 1  Injury with Fall? 0 - 0 0 0  Risk for fall due to : Impaired vision;Impaired balance/gait - - - Impaired vision;Impaired mobility;Impaired balance/gait;History of fall(s)  Risk for fall due to: Comment working on with PT - - - -  Follow up Falls prevention discussed - - - Falls  prevention discussed    FALL RISK PREVENTION PERTAINING TO THE HOME:  Any stairs in or around the home? Yes  If so, are there any without handrails? No  Home free of loose throw rugs in walkways, pet beds, electrical cords, etc? Yes  Adequate lighting in your home to reduce risk of falls? Yes   ASSISTIVE DEVICES UTILIZED TO PREVENT FALLS:  Life alert? No  Use of a cane, walker or w/c? Yes  Grab bars in the bathroom? Yes  Shower  chair or bench in shower? Yes  Elevated toilet seat or a handicapped toilet? Yes   TIMED UP AND GO:  Was the test performed? Yes .   Cognitive Function: MMSE - Mini Mental State Exam 06/07/2019  Orientation to time 4  Orientation to Place 5  Registration 3  Attention/ Calculation 5  Recall 3  Language- name 2 objects 2  Language- repeat 1  Language- follow 3 step command 3  Language- read & follow direction 1  Write a sentence 1  Copy design 1  Total score 29     6CIT Screen 04/02/2021 03/16/2020  What Year? 0 points 0 points  What month? 0 points 0 points  What time? 0 points -  Count back from 20 0 points 0 points  Months in reverse 0 points 0 points  Repeat phrase 0 points 0 points  Total Score 0 -    Immunizations Immunization History  Administered Date(s) Administered   Influenza Inj Mdck Quad Pf 01/05/2019   Influenza, High Dose Seasonal PF 12/13/2014, 01/22/2016, 11/25/2016, 11/18/2017, 11/29/2020   Influenza, Seasonal, Injecte, Preservative Fre 01/22/2016   Influenza-Unspecified 12/10/2019   PFIZER(Purple Top)SARS-COV-2 Vaccination 07/09/2019, 08/02/2019, 11/09/2019   PPD Test 02/27/2016   Pfizer Covid-19 Vaccine Bivalent Booster 51yr & up 01/23/2021   Pneumococcal Conjugate-13 12/13/2014   Pneumococcal Polysaccharide-23 02/07/2016   Tdap 05/26/2015    TDAP status: Up to date  Flu Vaccine status: Up to date  Pneumococcal vaccine status: Up to date  Covid-19 vaccine status: Completed vaccines  Qualifies for Shingles  Vaccine? Yes   Zostavax completed No   Shingrix Completed?: No.    Education has been provided regarding the importance of this vaccine. Patient has been advised to call insurance company to determine out of pocket expense if they have not yet received this vaccine. Advised may also receive vaccine at local pharmacy or Health Dept. Verbalized acceptance and understanding.  Screening Tests Health Maintenance  Topic Date Due   Zoster Vaccines- Shingrix (1 of 2) 06/07/2021 (Originally 09/24/1969)   DEXA SCAN  08/25/2021   TETANUS/TDAP  05/25/2025   COLONOSCOPY (Pts 45-487yrInsurance coverage will need to be confirmed)  03/12/2027   Pneumonia Vaccine 6551Years old  Completed   INFLUENZA VACCINE  Completed   COVID-19 Vaccine  Completed   HPV VACCINES  Aged Out   MAMMOGRAM  Discontinued   Hepatitis C Screening  Discontinued    Health Maintenance  There are no preventive care reminders to display for this patient.  Colorectal cancer screening: Type of screening: Colonoscopy. Completed 03/11/17. Repeat every 10 years    Bone Density status: Completed 08/26/19. Results reflect: Bone density results: OSTEOPENIA. Repeat every 2 years.  Additional Screening:  Hepatitis C Screening:  Completed 04/10/20  Vision Screening: Recommended annual ophthalmology exams for early detection of glaucoma and other disorders of the eye. Is the patient up to date with their annual eye exam?  Yes  Who is the provider or what is the name of the office in which the patient attends annual eye exams? [facility]  If pt is not established with a provider, would they like to be referred to a provider to establish care? No .   Dental Screening: Recommended annual dental exams for proper oral hygiene  Community Resource Referral / Chronic Care Management: CRR required this visit?  No   CCM required this visit?  No      Plan:     I have personally reviewed and noted the  following in the patients chart:    Medical and social history Use of alcohol, tobacco or illicit drugs  Current medications and supplements including opioid prescriptions.  Functional ability and status Nutritional status Physical activity Advanced directives List of other physicians Hospitalizations, surgeries, and ER visits in previous 12 months Vitals Screenings to include cognitive, depression, and falls Referrals and appointments  In addition, I have reviewed and discussed with patient certain preventive protocols, quality metrics, and best practice recommendations. A written personalized care plan for preventive services as well as general preventive health recommendations were provided to patient.     Willette Brace, LPN   0/22/1798   Nurse Notes: None

## 2021-04-04 ENCOUNTER — Ambulatory Visit (HOSPITAL_BASED_OUTPATIENT_CLINIC_OR_DEPARTMENT_OTHER): Payer: Medicare Other | Admitting: Physical Therapy

## 2021-04-04 ENCOUNTER — Other Ambulatory Visit: Payer: Self-pay

## 2021-04-04 ENCOUNTER — Encounter (HOSPITAL_BASED_OUTPATIENT_CLINIC_OR_DEPARTMENT_OTHER): Payer: Self-pay | Admitting: Physical Therapy

## 2021-04-04 DIAGNOSIS — M6281 Muscle weakness (generalized): Secondary | ICD-10-CM

## 2021-04-04 DIAGNOSIS — M25552 Pain in left hip: Secondary | ICD-10-CM

## 2021-04-04 DIAGNOSIS — R2681 Unsteadiness on feet: Secondary | ICD-10-CM

## 2021-04-04 DIAGNOSIS — R2689 Other abnormalities of gait and mobility: Secondary | ICD-10-CM

## 2021-04-04 DIAGNOSIS — M25652 Stiffness of left hip, not elsewhere classified: Secondary | ICD-10-CM

## 2021-04-04 NOTE — Therapy (Signed)
[facility] [address] [place], [name], [address] Phone: [phone]   Fax:  [phone]  Physical Therapy Treatment  Patient Details  Name: Tiffany [name] MRN: [id number] Date of Birth: [birth year] Referring Provider (PT): Inda Coke, [place]   Encounter Date: 04/04/2021   PT End of Session - 04/04/21 2050     Visit Number 14    Number of Visits 21    Date for PT Re-Evaluation 04/17/21    Authorization Type Medicare progress not perfromed on visit 9; next visit at 54    PT Start Time 1145    PT Stop Time 1227    PT Time Calculation (min) 42 min    Activity Tolerance Patient tolerated treatment well    Behavior During Therapy [facility] for tasks assessed/performed             Past Medical History:  Diagnosis Date   Bipolar 1 disorder ([place])    Breast cancer ([place])    Chronic diarrhea    loose stools twice a day on average for years   Chronic kidney disease (CKD), stage III (moderate) ([place])    Depression 1987   Hospitalization or health care facility admission within last 6 months 04/2019   for fall/seizures   Malignant neoplasm of overlapping sites of left breast in female, estrogen receptor positive ([place]) 03/14/2016   Dx in 09/2014, s/p bilateral mastectomies and ALND, 0/10 LN. 1.4 cm  Grade I invasive lobular, ER and PR +/Her--, Ki 67 <5% Tried anastrozole for one month, but developed suicidal idea   Osteoarthritis, knee 09/24/2019   Xray 09/2019   Parkinson's disease ([place]) 2012   Psoriatic arthritis ([place])    PTSD (post-traumatic stress disorder)    Secondary hyperparathyroidism ([place])    Tardive dyskinesia     Past Surgical History:  Procedure Laterality Date   [place] Bilateral 09/19/2014   TONSILLECTOMY  1970   URETERAL REIMPLANTION Bilateral 1974    There were no vitals filed for this visit.   Subjective  Assessment - 04/04/21 2035     Subjective Patient continues to have intermittent non-specific right sided chest wall pain She will see the doctor next week. She feels it when she takes a deep breath at times but dosen't other times. Her knee is doing OK today> She reports it may be a little stiff. She does not currently have any pain, just sitffness in her knees.    Limitations Standing;Walking;House hold activities    How long can you sit comfortably? no issues    How long can you stand comfortably? Could be immediate or an hour. For sure after an hour.    How long can you walk comfortably? has to use a device for limited distances    Patient Stated Goals Getting stronger, improving balance, pain free    Currently in Pain? No/denies                      LTR: x20 each way Seated ball roll and stretch forward and lateral    [employer]: Gentle Knee extension stretching    Knee: quad set 3x10  SAQ 3x10   Standing heel raise and toe raise with balance cath 2x10 March without UE control 3x10   Tandem stance 3x20 sec hold bilateral  Narrow base 3x20 sec hold bilateral  Narrow base eyes closed 3x20  sec hold bilateral                     PT Education - 04/04/21 2048     Education Details reviewed tehcniuqe with light exercises    Person(s) Educated Patient    Methods Explanation;Demonstration;Tactile cues;Verbal cues    Comprehension Returned demonstration;Verbal cues required;Verbalized understanding;Tactile cues required              PT Short Term Goals - 03/07/21 0835       PT SHORT TERM GOAL #1   Title Pt will improve tug with rollater below 14 secs to show improvement in ambulation.    Baseline 14 seconds 12/28    Time 3    Period Weeks    Status Achieved      PT SHORT TERM GOAL #2   Title Patient will improve strength for bilateral hip flexion to 5/5.    Baseline tested with hand dyno today    Time 3    Period Weeks    Status On-going       PT SHORT TERM GOAL #3   Title Patient will performed 5x sit to stand below 10  secs to show improvement is functional strenght.    Baseline 16 sec    Time 4    Period Weeks    Status On-going    Target Date 10/04/20      PT SHORT TERM GOAL #4   Title Pt will improve 5x sit<>stand to less than or equal to 18 seconds for improved functional strength.    Baseline 16.93 sec 06/30/2019    Time 4    Period Weeks    Status On-going      PT SHORT TERM GOAL #5   Title Pt/husband will verbalize understanding of fall prevention in home environment.    Time 4    Period Weeks    Status On-going               PT Long Term Goals - 01/15/21 1409       PT LONG TERM GOAL #1   Title Pt will be be able to perform six minute walk test and ambulate 700 feet without a device or with a less restricted device such as a cane independently in order to ambualte in the community    Time 8    Period Weeks    Status On-going      PT LONG TERM GOAL #2   Title Pt will go up and down 6 steps without increased pain    Baseline 3 steps    Time 6    Period Weeks    Status New      PT LONG TERM GOAL #3   Title Patient will be independent with pool program and general strengthening program in order to continue to progress mobility    Time 6    Period Weeks    Status On-going                   Plan - 04/04/21 2050     Clinical Impression Statement Therapy focused on lower extremity exercises and balance exercises. She had no reports of any type of chest pain. her knee extension is improving. She tolerated LE exercises well. She also did well with balance exercises. Therapy will continue to progresss as tolerated. She will see the MD next week. We will progress per reccoemndations.    Personal Factors and Comorbidities Fitness;Time since onset of  injury/illness/exacerbation;Comorbidity 3+    Comorbidities multiple pain locations, back, hip, knee, Decreased mobility post covid and  hospitalization last year,    Examination-Activity Limitations Stand;Locomotion Level;Bend;Dressing;Squat;Stairs    Examination-Participation Restrictions Cleaning;Meal Prep;Yard Work;Community Activity;Shop;Laundry    Stability/Clinical Decision Making Evolving/Moderate complexity    Clinical Decision Making Moderate    Rehab Potential Good    PT Frequency 2x / week    PT Duration 6 weeks    PT Treatment/Interventions ADLs/Self Care Home Management;Cryotherapy;Electrical Stimulation;Iontophoresis 4mg /ml Dexamethasone;Ultrasound;DME Instruction;Gait training;Stair training;Therapeutic activities;Therapeutic exercise;Neuromuscular re-education;Patient/family education;Manual techniques;Passive range of motion;Dry needling;Taping    PT Next Visit Plan stair climbing    PT Home Exercise Plan Access Code: [facility]  URL: [email or web address]  Date: 09/06/2020  Prepared by: Carolyne Littles    Exercises  Seated Knee Extension with Resistance - 1 x daily - 7 x weekly - 3 sets - 10 reps  Seated March with Resistance - 1 x daily - 7 x weekly - 3 sets - 10 reps  Seated Hip Abduction with Resistance - 1 x daily - 7 x weekly - 3 sets - 10 reps    Consulted and Agree with Plan of Care Patient             Patient will benefit from skilled therapeutic intervention in order to improve the following deficits and impairments:  Abnormal gait, Pain, Increased muscle spasms, Decreased activity tolerance, Decreased endurance, Decreased range of motion, Decreased strength, Impaired flexibility, Difficulty walking, Decreased balance, Decreased safety awareness  Visit Diagnosis: Other abnormalities of gait and mobility  Unsteadiness on feet  Muscle weakness (generalized)  Pain in left hip  Stiffness of left hip, not elsewhere classified     Problem List Patient Active Problem List   Diagnosis Date Noted   Osteoarthritis, knee 09/24/2019   CKD (chronic kidney disease) stage 4, GFR 15-29  ml/min ([place]) 09/24/2019   Severe recurrent major depression without psychotic features ([place]) 09/09/2019   Bipolar I disorder, most recent episode depressed ([place])    Transaminitis    Essential hypertension    Encephalopathy 04/26/2019   Leukocytosis 04/21/2019   Thrombocytopenia ([place]) 09/04/2018   Vitamin D deficiency 09/04/2018   Psoriatic arthritis ([place]) 09/04/2018   Tubular adenoma of colon 05/28/2017   History of colonic polyps 05/28/2017   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 [place], [name], [address] Phone: [phone]   Fax:  [phone]  Name: Tiffany [name] MRN: [id number] Date of Birth: 1950-03-16

## 2021-04-06 ENCOUNTER — Ambulatory Visit (HOSPITAL_BASED_OUTPATIENT_CLINIC_OR_DEPARTMENT_OTHER): Payer: Medicare Other | Admitting: Physical Therapy

## 2021-04-06 ENCOUNTER — Ambulatory Visit: Payer: Medicare Other | Admitting: Psychiatry

## 2021-04-09 ENCOUNTER — Encounter: Payer: Self-pay | Admitting: Physician Assistant

## 2021-04-09 ENCOUNTER — Ambulatory Visit (INDEPENDENT_AMBULATORY_CARE_PROVIDER_SITE_OTHER): Payer: Medicare Other | Admitting: Physician Assistant

## 2021-04-09 ENCOUNTER — Other Ambulatory Visit: Payer: Self-pay

## 2021-04-09 VITALS — BP 110/60 | HR 74 | Temp 98.7°F | Ht 64.0 in | Wt 205.4 lb

## 2021-04-09 DIAGNOSIS — R079 Chest pain, unspecified: Secondary | ICD-10-CM | POA: Diagnosis not present

## 2021-04-09 DIAGNOSIS — R5383 Other fatigue: Secondary | ICD-10-CM

## 2021-04-09 DIAGNOSIS — R053 Chronic cough: Secondary | ICD-10-CM

## 2021-04-09 DIAGNOSIS — R1084 Generalized abdominal pain: Secondary | ICD-10-CM

## 2021-04-09 DIAGNOSIS — R634 Abnormal weight loss: Secondary | ICD-10-CM | POA: Diagnosis not present

## 2021-04-09 DIAGNOSIS — M7989 Other specified soft tissue disorders: Secondary | ICD-10-CM | POA: Diagnosis not present

## 2021-04-09 NOTE — Progress Notes (Signed)
Tiffany [name] is a 71 y.o. female here for a follow up of unintentional weight loss.   History of Present Illness:   Chief Complaint  Patient presents with   Cough    Pt c/o cough again started on Saturday, coughing and expectorating dark green small amount. Pt is c/o feeling very tired since Saturday. She is feeling cold all the time but no fever.   Fatigue   Abdominal Pain    Pt c/o lower abdominal pain started this morning.Denies nausea and no back pain.    Unintentional Weight Loss As previously discussed, Tiffany [name] is still experiencing a decreased appetite, but isn't sure if there is a cause of concern. She admits that she still indulging in sweets from time to time, but is still noticing that her weight is slowly dropping. Denies changes to stool/ activity level. Thyroid labs are up to date. She has also been compliant with monitoring her weight over the past month, listed below:   03/09/21-210 03/17/21- 205 03/22/21- 203 03/24/21- 204 03/31/21- 203 04/02/21- 205 04/07/21- 206   Generalized Abdominal Pain Upon waking up this morning, Tiffany [name] states she experienced general abdominal pain. Upon further discussion, she described the pain as a sharp and burning sensation mainly within the center of her stomach. She does admit that this is a regular occurrence which doesn't tend to last long.   Pt's last colonoscopy was completed in 2019 which found 17 tubular adenomas, which were removed, as well as left sided- diverticulosis, and tortuous sigmoid colon. Despite this the rest of the procedure found nothing else remarkable, she was recommended to repeat this procedure within 2 years. Since then she has followed up with Dr. Tarri Glenn, gastroenterology, on 05/07/19 due to chronic diarrhea. Following this visit she was advised to trial metamucil for stool bulking and imodium. Additionally she was recommended to repeat her colonoscopy after receiving clearance from neurology. Although she has since been  cleared by neurology to go forward with the procedure, she has not done so. She is ready to proceed with colonoscopy at this time.   LE Swelling Since prior visit, pt has stopped used of norvasc 5 mg daily and has found her LE swelling has improved significantly. Additionally, she has been monitoring her BP at home regularly which have been WNL. Systolic was ranging from 616-073, diastolic 71-06. At this time she is on board with stopping the medication permanently. Patient denies chest pain, SOB, blurred vision, dizziness, unusual headaches, lower leg swelling. Denies excessive caffeine intake, stimulant usage, excessive alcohol intake, or increase in salt consumption.  BP Readings from Last 3 Encounters:  04/09/21 110/60  03/21/21 125/68  03/09/21 120/72    Chronic Cough As discussed at our previous visit on 03/09/21, pt had been experiencing a productive cough for the past 3 months. At that time she stated how she and her husband had similar sx, including the cough, but his resolved shortly after completion of their shared illness. Additionally, she admitted at this time that she would experience an occasional fever as well as post nasal drip which started her coughing spells. Due to sx, I recommended  she trial robitussin DM and Mucinex/Mucinex DM as well as antihistamines as needed.  We also did a chest x-ray which resulted found no active cardiopulmonary diease and was unremarkable.   Currently she expresses she has not taken any otc medicaiton due to sx improving shortly after the visit. She is still experiencing occasional post nasal drip, but is not concerned about  it at this time. Tiffany [name] did recently have visitors, one who was recently sick with a cold, and believes this to be contributing factor for return of cough. Denies fever or chills.    Chest Pain On 03/21/21, pt presented to the ED with c/o right sided chest pain that had been onset for a week, worsening that day. She  described the pain as a stabbing sensation that she mostly felt with movement or deep inhalation. At that time she denied cough, SOB, hemoptysis, or LE swelling. She has been having an intermittent dry cough, but didn't appear to be related to CP.   Following her evaluation, EKG showed sinus rhythm, no ischemic changes. Blood work was reassuring, baseline AKI, first troponin was 3, and X-ray showed no acute abnormality. Due to hx of breast cancer, a d-dimer was completed which was found to be 0.81.   Currently pt states her chest pain has been slowly improving , but she is still concerned due to pain being present in breast area. She has undergone a bilateral mastectomy which left no breast tissue remaining on her R breast -- she is concerned that she may have some underlying abnormalities but isn't wanting u/s just yet.   Fatigue  Tiffany [name] states that she has been experiencing increased feelings of fatigue for the past couple of days. She does admit that she had some visitors this past weekend and was on the move more than usual. While this could be the cause for her onset fatigue, she felt it was worth mentioning due to other sx going on.   Past Medical History:  Diagnosis Date   Bipolar 1 disorder ([place])    Breast cancer ([place])    Chronic diarrhea    loose stools twice a day on average for years   Chronic kidney disease (CKD), stage III (moderate) ([place])    Depression 1987   Hospitalization or health care facility admission within last 6 months 04/2019   for fall/seizures   Malignant neoplasm of overlapping sites of left breast in female, estrogen receptor positive ([place]) 03/14/2016   Dx in 09/2014, s/p bilateral mastectomies and ALND, 0/10 LN. 1.4 cm  Grade I invasive lobular, ER and PR +/Her--, Ki 67 <5% Tried anastrozole for one month, but developed suicidal idea   Osteoarthritis, knee 09/24/2019   Xray 09/2019   Parkinson's disease ([place]) 2012   Psoriatic arthritis ([place])    PTSD (post-traumatic  stress disorder)    Secondary hyperparathyroidism ([place])    Tardive dyskinesia      Social History   Tobacco Use   Smoking status: Never   Smokeless tobacco: Never  Vaping Use   Vaping Use: Never used  Substance Use Topics   Alcohol use: Never   Drug use: Never    Past Surgical History:  Procedure Laterality Date   ABDOMINAL HYSTERECTOMY  1987   CHOLECYSTECTOMY  1979   DILATION AND CURETTAGE OF UTERUS  1973   MASTECTOMY Bilateral 09/19/2014   TONSILLECTOMY  1970   URETERAL REIMPLANTION Bilateral 1974    Family History  Problem Relation Age of Onset   Cancer Mother    Cancer Sister    Stroke Maternal Grandfather    Diabetes Paternal Grandfather    Cancer Sister     Allergies  Allergen Reactions   Aripiprazole Other (See Comments)    Parkinsonism      Lactose Intolerance ([place]) Diarrhea   Methotrexate Other (See Comments)    Hair loss, severe stomatitis  Cefdinir Diarrhea    Other reaction(s): Diarrhea Yeast infection and fever; negative c diff   Etanercept Other (See Comments)    Headaches     Exemestane Other (See Comments)    Suicidal thoughts with medication     Fluoxetine Other (See Comments)    Parkinsonism   Lactose     Other reaction(s): diarrhea Other reaction(s): diarrhea Other reaction(s): diarrhea   Methylprednisolone Sodium Succ Other (See Comments)    Agitated mania   Epinephrine Palpitations    tachycardia    Nitrofurantoin Nausea And Vomiting and Rash      Other reaction(s): rash, diarrhea Other reaction(s): rash, diarrhea Other reaction(s): rash, diarrhea Other reaction(s): rash, diarrhea    Current Medications:   Current Outpatient Medications:    Certolizumab Pegol (CIMZIA) 2 X 200 MG KIT, See admin instructions., Disp: , Rfl:    cholecalciferol (VITAMIN D3) 25 MCG (1000 UNIT) tablet, Take 1,000 Units by mouth daily., Disp: , Rfl:    lamoTRIgine (LAMICTAL) 200 MG tablet, Take 1 tablet (200 mg total) by mouth at  bedtime., Disp: 90 tablet, Rfl: 3   levothyroxine (SYNTHROID) 112 MCG tablet, Take 1 tablet (112 mcg total) by mouth daily before breakfast., Disp: 90 tablet, Rfl: 0   lithium carbonate 300 MG capsule, TAKE 1 CAPSULE(300 MG) BY MOUTH AT BEDTIME, Disp: 90 capsule, Rfl: 3   LORazepam (ATIVAN) 1 MG tablet, TAKE 1 TABLET(1 MG) BY MOUTH TWICE DAILY AS NEEDED, Disp: 60 tablet, Rfl: 2   metroNIDAZOLE (METROGEL) 0.75 % gel, Apply to face 1-2 times daily, Disp: 45 g, Rfl: 0   prazosin (MINIPRESS) 2 MG capsule, Take 1 to 2 capsules at bedtime., Disp: 180 capsule, Rfl: 3   sodium bicarbonate 650 MG tablet, Take 650 mg by mouth daily., Disp: , Rfl:    Review of Systems:   Review of Systems  Respiratory:  Positive for cough.   Gastrointestinal:  Positive for abdominal pain.   Negative unless otherwise specified per HPI.   Vitals:   Vitals:   04/09/21 1107  BP: 110/60  Pulse: 74  Temp: 98.7 F (37.1 C)  TempSrc: Temporal  SpO2: 95%  Weight: 205 lb 6.1 oz (93.2 kg)  Height: _0  (1.626 m)     Body mass index is 35.25 kg/m.  Physical Exam:   Physical Exam Vitals and nursing note reviewed.  Constitutional:      General: She is not in acute distress.    Appearance: She is well-developed. She is not ill-appearing or toxic-appearing.  Cardiovascular:     Rate and Rhythm: Normal rate and regular rhythm.     Pulses: Normal pulses.     Heart sounds: Normal heart sounds, S1 normal and S2 normal.  Pulmonary:     Effort: Pulmonary effort is normal.     Breath sounds: Normal breath sounds.  Abdominal:     General: Abdomen is flat.     Palpations: Abdomen is soft.     Tenderness: There is no abdominal tenderness.  Skin:    General: Skin is warm and dry.  Neurological:     Mental Status: She is alert.     GCS: GCS eye subscore is 4. GCS verbal subscore is 5. GCS motor subscore is 6.  Psychiatric:        Speech: Speech normal.        Behavior: Behavior normal. Behavior is cooperative.     Assessment and Plan:   Chest pain, unspecified type Improving per patient She declines  physical exam Low threshold to ultrasound breast area if symptoms persist/worsen  Leg swelling Resolved Continue to hold norvasc 5 mg daily  Continue to monitor BP at home, 1-2 times a week  If BP is consistently >140/90, reach out to office and we will restart a new low dose blood pressure medication   Chronic cough No red flags  Advised patient as follows: Consider over-the-counter antihistamines Claritin/Loratadine and Zyrtec/Cetrizine. You can use Robitussin DM Mucinex and Mucinex DM for cough.   Unintentional weight loss; Fatigue Update UA today She would like to hold off on any additional blood work at this time -- this was recently completed and overall stable -- kidney function has been stable in the past 3 months but we may need to refer to nephrology I advised patient to follow up with Dr. Tarri Glenn, gastroenterology, for further discussion of future colonoscopy  Follow up in 3 months, sooner if concerns   Generalized abdominal pain With unintentional weight loss, recommended she follow-up with Dr. Tarri Glenn. She is agreeable.   I,Havlyn C Ratchford,acting as a Education administrator for [employer], PA.,have documented all relevant documentation on the behalf of Tiffany Coke, PA,as directed by  Tiffany Coke, PA while in the presence of Tiffany [name], [place].  I, Tiffany [name], [place], have reviewed all documentation for this visit. The documentation on 04/09/21 for the exam, diagnosis, procedures, and orders are all accurate and complete.  Time spent with patient today was 50 minutes which consisted of chart review, discussing diagnosis, work up, treatment answering questions and documentation.   Tiffany Coke, PA-C

## 2021-04-09 NOTE — Patient Instructions (Signed)
It was great to see you!  Please call Dr. Tarri Glenn to discuss getting your colonoscopy  We will check your urine studies today  Low threshold to ultrasound your breast area -- just let me know  Consider over-the-counter antihistamines Claritin/Loratadine and Zyrtec/Cetrizine. You can use Robitussin DM Mucinex and Mucinex DM for cough.   Let's follow-up in 3 months, sooner if you have concerns.  If a referral was placed today, you will be contacted for an appointment. Please note that routine referrals can sometimes take up to 3-4 weeks to process. Please call our office if you haven't heard anything after this time frame.  Take care,  Inda Coke PA-C

## 2021-04-11 ENCOUNTER — Other Ambulatory Visit: Payer: Self-pay

## 2021-04-11 ENCOUNTER — Ambulatory Visit (HOSPITAL_BASED_OUTPATIENT_CLINIC_OR_DEPARTMENT_OTHER): Payer: Medicare Other | Attending: Physician Assistant | Admitting: Physical Therapy

## 2021-04-11 ENCOUNTER — Encounter (HOSPITAL_BASED_OUTPATIENT_CLINIC_OR_DEPARTMENT_OTHER): Payer: Self-pay | Admitting: Physical Therapy

## 2021-04-11 DIAGNOSIS — M25652 Stiffness of left hip, not elsewhere classified: Secondary | ICD-10-CM | POA: Insufficient documentation

## 2021-04-11 DIAGNOSIS — R2689 Other abnormalities of gait and mobility: Secondary | ICD-10-CM | POA: Diagnosis not present

## 2021-04-11 DIAGNOSIS — M6281 Muscle weakness (generalized): Secondary | ICD-10-CM | POA: Diagnosis present

## 2021-04-11 DIAGNOSIS — M25552 Pain in left hip: Secondary | ICD-10-CM | POA: Diagnosis present

## 2021-04-11 DIAGNOSIS — R2681 Unsteadiness on feet: Secondary | ICD-10-CM | POA: Insufficient documentation

## 2021-04-11 NOTE — Therapy (Signed)
[facility] [address] [place], [name], [address] Phone: [phone]   Fax:  [phone]  Physical Therapy Treatment  Patient Details  Name: Tiffany [name] MRN: [id number] Date of Birth: [birth year] Referring Provider (PT): Tiffany [name], [place]   Encounter Date: 04/11/2021   PT End of Session - 04/11/21 1154     Visit Number 15    Number of Visits 21    Date for PT Re-Evaluation 04/17/21    Authorization Type Medicare progress not perfromed on visit 9; next visit at 85    PT Start Time 1145    PT Stop Time 1228    PT Time Calculation (min) 43 min    Activity Tolerance Patient tolerated treatment well    Behavior During Therapy [facility] for tasks assessed/performed             Past Medical History:  Diagnosis Date   Bipolar 1 disorder ([place])    Breast cancer ([place])    Chronic diarrhea    loose stools twice a day on average for years   Chronic kidney disease (CKD), stage III (moderate) ([place])    Depression 1987   Hospitalization or health care facility admission within last 6 months 04/2019   for fall/seizures   Malignant neoplasm of overlapping sites of left breast in female, estrogen receptor positive ([place]) 03/14/2016   Dx in 09/2014, s/p bilateral mastectomies and ALND, 0/10 LN. 1.4 cm  Grade I invasive lobular, ER and PR +/Her--, Ki 67 <5% Tried anastrozole for one month, but developed suicidal idea   Osteoarthritis, knee 09/24/2019   Xray 09/2019   Parkinson's disease ([place]) 2012   Psoriatic arthritis ([place])    PTSD (post-traumatic stress disorder)    Secondary hyperparathyroidism ([place])    Tardive dyskinesia     Past Surgical History:  Procedure Laterality Date   [place] Bilateral 09/19/2014   TONSILLECTOMY  1970   URETERAL REIMPLANTION Bilateral 1974    There were no vitals filed for this visit.   Subjective  Assessment - 04/11/21 1151     Subjective Patient rpeorted her claves were hurting her a bit over the weekend. She had her grandkids with her. She was up on her feet all weekend. He pain is better today. her knee is feeling pretty good.    Limitations Standing;Walking;House hold activities    How long can you stand comfortably? Could be immediate or an hour. For sure after an hour.    How long can you walk comfortably? has to use a device for limited distances    Patient Stated Goals Getting stronger, improving balance, pain free    Currently in Pain? No/denies    Pain Descriptors / Indicators Aching    Pain Type Chronic pain    Pain Radiating Towards foot    Pain Onset More than a month ago    Pain Frequency Constant    Aggravating Factors  just painful    Pain Relieving Factors light stretching    Effect of Pain on Daily Activities limits daily activity    Multiple Pain Sites No                           LTR: x20 each way   [place]: [place] Knee extension stretching    Knee: quad set 3x10  SAQ 3x10 bilateral  Seated hip abduction 3x15; red   Standing:   Scap retraction 2x10 yellow;  Shoulder extension 2x10 yellow. Patient reported minor syncope in standing.    Patient reported minor syncope at the end of the session. Her B/P was measured at 145/87 she reported improvement in sitting.               PT Education - 04/11/21 1153   Technique with exercises   Person(s) Educated Patient    Methods Explanation;Demonstration;Tactile cues;Verbal cues    Comprehension Verbalized understanding;Returned demonstration;Verbal cues required;Tactile cues required              PT Short Term Goals - 03/07/21 0835       PT SHORT TERM GOAL #1   Title Pt will improve tug with rollater below 14 secs to show improvement in ambulation.    Baseline 14 seconds 12/28    Time 3    Period Weeks    Status Achieved      PT SHORT TERM GOAL #2   Title Patient  will improve strength for bilateral hip flexion to 5/5.    Baseline tested with hand dyno today    Time 3    Period Weeks    Status On-going      PT SHORT TERM GOAL #3   Title Patient will performed 5x sit to stand below 10  secs to show improvement is functional strenght.    Baseline 16 sec    Time 4    Period Weeks    Status On-going    Target Date 10/04/20      PT SHORT TERM GOAL #4   Title Pt will improve 5x sit<>stand to less than or equal to 18 seconds for improved functional strength.    Baseline 16.93 sec 06/30/2019    Time 4    Period Weeks    Status On-going      PT SHORT TERM GOAL #5   Title Pt/husband will verbalize understanding of fall prevention in home environment.    Time 4    Period Weeks    Status On-going               PT Long Term Goals - 01/15/21 1409       PT LONG TERM GOAL #1   Title Pt will be be able to perform six minute walk test and ambulate 700 feet without a device or with a less restricted device such as a cane independently in order to ambualte in the community    Time 8    Period Weeks    Status On-going      PT LONG TERM GOAL #2   Title Pt will go up and down 6 steps without increased pain    Baseline 3 steps    Time 6    Period Weeks    Status New      PT LONG TERM GOAL #3   Title Patient will be independent with pool program and general strengthening program in order to continue to progress mobility    Time 6    Period Weeks    Status On-going                   Plan - 04/11/21 1201     Clinical Impression Statement Patients knee extension is improving significantly. She is looking forward to getting back to walking. She tolerated thera--ex well today. She had no significant pain. Therapy added in UE strengthening exercises. She tolerated  well. Therapy updated her HEP.    Personal Factors and Comorbidities Fitness;Time since onset of injury/illness/exacerbation;Comorbidity 3+    Comorbidities multiple pain  locations, back, hip, knee, Decreased mobility post covid and hospitalization last year,    Examination-Activity Limitations Stand;Locomotion Level;Bend;Dressing;Squat;Stairs    Examination-Participation Restrictions Cleaning;Meal Prep;Yard Work;Community Activity;Shop;Laundry    Stability/Clinical Decision Making Evolving/Moderate complexity    Clinical Decision Making Moderate    Rehab Potential Good    PT Frequency 2x / week    PT Duration 6 weeks    PT Treatment/Interventions ADLs/Self Care Home Management;Cryotherapy;Electrical Stimulation;Iontophoresis 4mg /ml Dexamethasone;Ultrasound;DME Instruction;Gait training;Stair training;Therapeutic activities;Therapeutic exercise;Neuromuscular re-education;Patient/family education;Manual techniques;Passive range of motion;Dry needling;Taping    PT Next Visit Plan stair climbing    PT Home Exercise Plan Access Code: [facility]  URL: [email or web address]  Date: 09/06/2020  Prepared by: Carolyne Littles    Exercises  Seated Knee Extension with Resistance - 1 x daily - 7 x weekly - 3 sets - 10 reps  Seated March with Resistance - 1 x daily - 7 x weekly - 3 sets - 10 reps  Seated Hip Abduction with Resistance - 1 x daily - 7 x weekly - 3 sets - 10 reps    Consulted and Agree with Plan of Care Patient             Patient will benefit from skilled therapeutic intervention in order to improve the following deficits and impairments:  Abnormal gait, Pain, Increased muscle spasms, Decreased activity tolerance, Decreased endurance, Decreased range of motion, Decreased strength, Impaired flexibility, Difficulty walking, Decreased balance, Decreased safety awareness, Decreased knowledge of precautions  Visit Diagnosis: Other abnormalities of gait and mobility  Unsteadiness on feet  Muscle weakness (generalized)  Pain in left hip  Stiffness of left hip, not elsewhere classified     Problem List Patient Active Problem List   Diagnosis  Date Noted   Osteoarthritis, knee 09/24/2019   CKD (chronic kidney disease) stage 4, GFR 15-29 ml/min ([place]) 09/24/2019   Severe recurrent major depression without psychotic features ([place]) 09/09/2019   Bipolar I disorder, most recent episode depressed ([place])    Transaminitis    Essential hypertension    Encephalopathy 04/26/2019   Leukocytosis 04/21/2019   Thrombocytopenia ([place]) 09/04/2018   Vitamin D deficiency 09/04/2018   Psoriatic arthritis ([place]) 09/04/2018   Tubular adenoma of colon 05/28/2017   History of colonic polyps 05/28/2017   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 09/12/2016   Malignant neoplasm of overlapping sites of left breast in female, estrogen receptor positive ([place]) 03/14/2016   Chronic kidney disease, stage III (moderate) 01/19/2016   Tardive dyskinesia 10/18/2015   History of breast cancer left 2016 12/27/2014   Osteopenia determined by x-ray 10/31/2014   Rosacea 05/21/2007   Bipolar affective disorder, mixed ([place]) 08/16/2004   Acquired hypothyroidism 04/03/1996    Carney Living, PT 04/11/2021, 12:52 PM  [facility] [address] [place], [name], [address] Phone: [phone]   Fax:  [phone]  Name: Tiffany [name] MRN: [id number] Date of Birth: 1950/12/21

## 2021-04-12 ENCOUNTER — Telehealth: Payer: Self-pay | Admitting: Physician Assistant

## 2021-04-12 NOTE — Telephone Encounter (Signed)
Pt called wanting to speak with MA and worley re ultrasound referral. She is ready to sch and would like on to be put in. Pt wants to be contacted when done.- Tiffany [name]

## 2021-04-13 ENCOUNTER — Ambulatory Visit (INDEPENDENT_AMBULATORY_CARE_PROVIDER_SITE_OTHER): Payer: Medicare Other | Admitting: Psychiatry

## 2021-04-13 ENCOUNTER — Other Ambulatory Visit: Payer: Self-pay

## 2021-04-13 ENCOUNTER — Ambulatory Visit (HOSPITAL_BASED_OUTPATIENT_CLINIC_OR_DEPARTMENT_OTHER): Payer: Medicare Other | Admitting: Physical Therapy

## 2021-04-13 ENCOUNTER — Other Ambulatory Visit: Payer: Self-pay | Admitting: Physician Assistant

## 2021-04-13 DIAGNOSIS — M6281 Muscle weakness (generalized): Secondary | ICD-10-CM

## 2021-04-13 DIAGNOSIS — M25552 Pain in left hip: Secondary | ICD-10-CM

## 2021-04-13 DIAGNOSIS — F411 Generalized anxiety disorder: Secondary | ICD-10-CM | POA: Diagnosis not present

## 2021-04-13 DIAGNOSIS — R2689 Other abnormalities of gait and mobility: Secondary | ICD-10-CM | POA: Diagnosis not present

## 2021-04-13 DIAGNOSIS — F3132 Bipolar disorder, current episode depressed, moderate: Secondary | ICD-10-CM | POA: Diagnosis not present

## 2021-04-13 DIAGNOSIS — F4010 Social phobia, unspecified: Secondary | ICD-10-CM

## 2021-04-13 DIAGNOSIS — R2681 Unsteadiness on feet: Secondary | ICD-10-CM

## 2021-04-13 DIAGNOSIS — F4310 Post-traumatic stress disorder, unspecified: Secondary | ICD-10-CM

## 2021-04-13 DIAGNOSIS — N644 Mastodynia: Secondary | ICD-10-CM

## 2021-04-13 DIAGNOSIS — M25652 Stiffness of left hip, not elsewhere classified: Secondary | ICD-10-CM

## 2021-04-13 DIAGNOSIS — Z8669 Personal history of other diseases of the nervous system and sense organs: Secondary | ICD-10-CM
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
## 2021-04-13 NOTE — Telephone Encounter (Signed)
Spoke to pt told her [facility] has placed orders for mammogram and ultrasound someone will contact you to schedule appointments. Pt verbalized understanding.

## 2021-04-13 NOTE — Therapy (Signed)
[facility] [address] [place], [name], [address] Phone: [phone]   Fax:  [phone]  Physical Therapy Treatment  Patient Details  Name: Tiffany [name] MRN: [id number] Date of Birth: 03-20-1950 Referring Provider (PT): Inda Coke, [place]   Encounter Date: 04/13/2021   PT End of Session - 04/13/21 0948     Visit Number 16    Number of Visits 21    Date for PT Re-Evaluation 04/17/21    Authorization Type Medicare progress not perfromed on visit 9; next visit at 47    PT Start Time 0948    PT Stop Time 1030    PT Time Calculation (min) 42 min    Activity Tolerance Patient tolerated treatment well    Behavior During Therapy [facility] for tasks assessed/performed             Past Medical History:  Diagnosis Date   Bipolar 1 disorder ([place])    Breast cancer ([place])    Chronic diarrhea    loose stools twice a day on average for years   Chronic kidney disease (CKD), stage III (moderate) ([place])    Depression 1987   Hospitalization or health care facility admission within last 6 months 04/2019   for fall/seizures   Malignant neoplasm of overlapping sites of left breast in female, estrogen receptor positive ([place]) 03/14/2016   Dx in 09/2014, s/p bilateral mastectomies and ALND, 0/10 LN. 1.4 cm  Grade I invasive lobular, ER and PR +/Her--, Ki 67 <5% Tried anastrozole for one month, but developed suicidal idea   Osteoarthritis, knee 09/24/2019   Xray 09/2019   Parkinson's disease ([place]) 2012   Psoriatic arthritis ([place])    PTSD (post-traumatic stress disorder)    Secondary hyperparathyroidism ([place])    Tardive dyskinesia     Past Surgical History:  Procedure Laterality Date   [place] Bilateral 09/19/2014   TONSILLECTOMY  1970   URETERAL REIMPLANTION Bilateral 1974    There were no vitals filed for this visit.   Subjective  Assessment - 04/13/21 0949     Subjective Pt reports no kne pain.  Only if twisted does it hurt.  She has learn how to avoid positions that make it hurt. "I am much better, knee is straighter"  Pt reports she is not using walker as much as she had been.  Not using it in home only to carry laundry.             Pt seen for aquatic therapy today.  Treatment took place in water 3.25-4.8 ft in depth at the [employer] pool. Temp of water was 93.  Pt entered/exited the pool via stairs  combination ofstep to/through pattern independently with bilat rail.   Walking for warm up back and forth by bench to improve confidence in ability.  Pt amb fwd, back and side stepping with distance sup.  Cues for increased speed for  increased resistance  Standing holding to pool wall -marching; add/abd, hip extension; hip flex 2x12   STS from bench x10 indep gaining immediate standing balance indep -progressed to 3rd water step x 8.  Pt rising with vc gaining immediate standing balance indep all but 1 try.    Seated 3rd water step: stretching hamstrings and gastroc Flutter kicking 2 x 20 reps Add/abd 2x20      Pt requires buoyancy for support and to  offload joints with strengthening exercises. Viscosity of the water is needed for resistance of strengthening; water current perturbations provides challenge to standing balance unsupported, requiring increased core activation.                          PT Education - 04/11/21 1153   Technique with exercises   Person(s) Educated Patient    Methods Explanation;Demonstration;Tactile cues;Verbal cues    Comprehension Verbalized understanding;Returned demonstration;Verbal cues required;Tactile cues required              PT Short Term Goals - 03/07/21 0835       PT SHORT TERM GOAL #1   Title Pt will improve tug with rollater below 14 secs to show improvement in ambulation.    Baseline 14 seconds 12/28    Time 3     Period Weeks    Status Achieved      PT SHORT TERM GOAL #2   Title Patient will improve strength for bilateral hip flexion to 5/5.    Baseline tested with hand dyno today    Time 3    Period Weeks    Status On-going      PT SHORT TERM GOAL #3   Title Patient will performed 5x sit to stand below 10  secs to show improvement is functional strenght.    Baseline 16 sec    Time 4    Period Weeks    Status On-going    Target Date 10/04/20      PT SHORT TERM GOAL #4   Title Pt will improve 5x sit<>stand to less than or equal to 18 seconds for improved functional strength.    Baseline 16.93 sec 06/30/2019    Time 4    Period Weeks    Status On-going      PT SHORT TERM GOAL #5   Title Pt/husband will verbalize understanding of fall prevention in home environment.    Time 4    Period Weeks    Status On-going               PT Long Term Goals - 01/15/21 1409       PT LONG TERM GOAL #1   Title Pt will be be able to perform six minute walk test and ambulate 700 feet without a device or with a less restricted device such as a cane independently in order to ambualte in the community    Time 8    Period Weeks    Status On-going      PT LONG TERM GOAL #2   Title Pt will go up and down 6 steps without increased pain    Baseline 3 steps    Time 6    Period Weeks    Status New      PT LONG TERM GOAL #3   Title Patient will be independent with pool program and general strengthening program in order to continue to progress mobility    Time 6    Period Weeks    Status On-going                   Plan - 04/13/21 3536     Clinical Impression Statement Pt progressing with amb with decreased need for assistive device. She is also rising from all chairs with improvment, indep without strain.  She reports increased strength and confidence with functional mobility in her home.  Strength and balance improving as evidenced by  improved STS from (low) 3rd step consecutive reps  with improved indep with immedate standing balance. She is completing stair climbing using alternating pattern exiting pool.    Stability/Clinical Decision Making Evolving/Moderate complexity    Clinical Decision Making Moderate    Rehab Potential Good    PT Frequency 2x / week    PT Duration 6 weeks    PT Treatment/Interventions ADLs/Self Care Home Management;Cryotherapy;Electrical Stimulation;Iontophoresis 4mg /ml Dexamethasone;Ultrasound;DME Instruction;Gait training;Stair training;Therapeutic activities;Therapeutic exercise;Neuromuscular re-education;Patient/family education;Manual techniques;Passive range of motion;Dry needling;Taping    PT Home Exercise Plan Access Code: [facility]  URL: [email or web address]  Date: 09/06/2020  Prepared by: Carolyne Littles    Exercises  Seated Knee Extension with Resistance - 1 x daily - 7 x weekly - 3 sets - 10 reps  Seated March with Resistance - 1 x daily - 7 x weekly - 3 sets - 10 reps  Seated Hip Abduction with Resistance - 1 x daily - 7 x weekly - 3 sets - 10 reps              Patient will benefit from skilled therapeutic intervention in order to improve the following deficits and impairments:  Abnormal gait, Pain, Increased muscle spasms, Decreased activity tolerance, Decreased endurance, Decreased range of motion, Decreased strength, Impaired flexibility, Difficulty walking, Decreased balance, Decreased safety awareness, Decreased knowledge of precautions  Visit Diagnosis: Other abnormalities of gait and mobility  Unsteadiness on feet  Muscle weakness (generalized)  Pain in left hip  Stiffness of left hip, not elsewhere classified     Problem List Patient Active Problem List   Diagnosis Date Noted   Osteoarthritis, knee 09/24/2019   CKD (chronic kidney disease) stage 4, GFR 15-29 ml/min ([place]) 09/24/2019   Severe recurrent major depression without psychotic features ([place]) 09/09/2019   Bipolar I disorder, most recent episode  depressed ([place])    Transaminitis    Essential hypertension    Encephalopathy 04/26/2019   Leukocytosis 04/21/2019   Thrombocytopenia ([place]) 09/04/2018   Vitamin D deficiency 09/04/2018   Psoriatic arthritis ([place]) 09/04/2018   Tubular adenoma of colon 05/28/2017   History of colonic polyps 05/28/2017   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 [place], [name], [address] Phone: [phone]   Fax:  [phone]  Name: Aliannah Holstrom MRN: [id number] Date of Birth: [birth year]

## 2021-04-13 NOTE — Telephone Encounter (Signed)
Please see message and advise 

## 2021-04-16 ENCOUNTER — Encounter (HOSPITAL_BASED_OUTPATIENT_CLINIC_OR_DEPARTMENT_OTHER): Payer: Self-pay | Admitting: Physical Therapy

## 2021-04-16 ENCOUNTER — Ambulatory Visit (HOSPITAL_BASED_OUTPATIENT_CLINIC_OR_DEPARTMENT_OTHER): Payer: Medicare Other | Admitting: Physical Therapy

## 2021-04-16 ENCOUNTER — Other Ambulatory Visit: Payer: Self-pay

## 2021-04-16 DIAGNOSIS — M25652 Stiffness of left hip, not elsewhere classified: Secondary | ICD-10-CM

## 2021-04-16 DIAGNOSIS — M25552 Pain in left hip: Secondary | ICD-10-CM

## 2021-04-16 DIAGNOSIS — R2681 Unsteadiness on feet: Secondary | ICD-10-CM

## 2021-04-16 DIAGNOSIS — R2689 Other abnormalities of gait and mobility: Secondary | ICD-10-CM

## 2021-04-16 DIAGNOSIS — M6281 Muscle weakness (generalized): Secondary | ICD-10-CM

## 2021-04-16 NOTE — Therapy (Signed)
[facility] [address] [place], [name], [address] Phone: [phone]   Fax:  [phone]  Physical Therapy Treatment  Patient Details  Name: Tiffany [name] MRN: [id number] Date of Birth: March 07, 1951 Referring Provider (PT): Inda Coke, [place]   Encounter Date: 04/16/2021   PT End of Session - 04/16/21 1215     Visit Number 17    Number of Visits 21    Date for PT Re-Evaluation 04/17/21    Authorization Type Medicare progress not perfromed on visit 9; next visit at 52    PT Start Time 1208    PT Stop Time 1255    PT Time Calculation (min) 47 min    Activity Tolerance Patient tolerated treatment well    Behavior During Therapy [facility] for tasks assessed/performed             Past Medical History:  Diagnosis Date   Bipolar 1 disorder ([place])    Breast cancer ([place])    Chronic diarrhea    loose stools twice a day on average for years   Chronic kidney disease (CKD), stage III (moderate) ([place])    Depression 1987   Hospitalization or health care facility admission within last 6 months 04/2019   for fall/seizures   Malignant neoplasm of overlapping sites of left breast in female, estrogen receptor positive ([place]) 03/14/2016   Dx in 09/2014, s/p bilateral mastectomies and ALND, 0/10 LN. 1.4 cm  Grade I invasive lobular, ER and PR +/Her--, Ki 67 <5% Tried anastrozole for one month, but developed suicidal idea   Osteoarthritis, knee 09/24/2019   Xray 09/2019   Parkinson's disease ([place]) 2012   Psoriatic arthritis ([place])    PTSD (post-traumatic stress disorder)    Secondary hyperparathyroidism ([place])    Tardive dyskinesia     Past Surgical History:  Procedure Laterality Date   [place] Bilateral 09/19/2014   TONSILLECTOMY  1970   URETERAL REIMPLANTION Bilateral 1974    There were no vitals filed for this visit.   Subjective  Assessment - 04/16/21 1211     Subjective No sx after last treatment.  "Feeling good". Have been able to roll over in bed at night with less knee pan    Pain Score 0-No pain    Pain Location Generalized    Pain Score 0                                          PT Short Term Goals - 03/07/21 0835       PT SHORT TERM GOAL #1   Title Pt will improve tug with rollater below 14 secs to show improvement in ambulation.    Baseline 14 seconds 12/28    Time 3    Period Weeks    Status Achieved      PT SHORT TERM GOAL #2   Title Patient will improve strength for bilateral hip flexion to 5/5.    Baseline tested with hand dyno today    Time 3    Period Weeks    Status On-going      PT SHORT TERM GOAL #3   Title Patient will performed 5x sit to stand below 10  secs to show improvement is functional strenght.    Baseline 16 sec  Time 4    Period Weeks    Status On-going    Target Date 10/04/20      PT SHORT TERM GOAL #4   Title Pt will improve 5x sit<>stand to less than or equal to 18 seconds for improved functional strength.    Baseline 16.93 sec 06/30/2019    Time 4    Period Weeks    Status On-going      PT SHORT TERM GOAL #5   Title Pt/husband will verbalize understanding of fall prevention in home environment.    Time 4    Period Weeks    Status On-going               PT Long Term Goals - 01/15/21 1409       PT LONG TERM GOAL #1   Title Pt will be be able to perform six minute walk test and ambulate 700 feet without a device or with a less restricted device such as a cane independently in order to ambualte in the community    Time 8    Period Weeks    Status On-going      PT LONG TERM GOAL #2   Title Pt will go up and down 6 steps without increased pain    Baseline 3 steps    Time 6    Period Weeks    Status New      PT LONG TERM GOAL #3   Title Patient will be independent with pool program and general strengthening program  in order to continue to progress mobility    Time 6    Period Weeks    Status On-going            Pt seen for aquatic therapy today.  Treatment took place in water 3.25-4.8 ft in depth at the [employer] [facility] pool. Temp of water was 93.  Pt entered/exited the pool via stairs  combination ofstep to/through pattern independently with bilat rail.   Walking for warm up back and forth by bench to improve confidence in ability.  Pt amb fwd, back and side stepping with distance sup.  Cues for increased speed for  increased resistance   Standing holding to pool wall unilateral improved balance   -marching; add/abd, hip extension; hip flex; marching 2x12 -forward lunges x 12 -jumping bilateral LE 2x10;unilateral r/l 2x10.  UE supported by blue hand buoys    STS  3rd water step x 10.  Pt rising gaining immediate standing balance indep 10/10 -progressed to 4th step x 8.  Gaining immediate standing balance indep 4/8     Seated 3rd water step: stretching hamstrings and gastroc Flutter kicking 2 x 25 reps Add/abd 2x20       Pt requires buoyancy for support and to offload joints with strengthening exercises. Viscosity of the water is needed for resistance of strengthening; water current perturbations provides challenge to standing balance unsupported, requiring increased core activation.        Plan - 04/16/21 1210     Clinical Impression Statement pt continues to improve reporting little to no pain with stair climbing entering/exiting home (4steps)  She does use handrail and complete with step to pattern. Overall pain decrease to 0/10 today. Pt improving with le strength as evidenced by decreased submersion with STS transfers complete on 4th step.  Pt rising gaining immediate standing balance with increase indep.    Stability/Clinical Decision Making Evolving/Moderate complexity    Clinical Decision Making Moderate  Rehab Potential Good    PT Frequency 2x / week    PT Duration 6  weeks    PT Treatment/Interventions ADLs/Self Care Home Management;Cryotherapy;Electrical Stimulation;Iontophoresis 4mg /ml Dexamethasone;Ultrasound;DME Instruction;Gait training;Stair training;Therapeutic activities;Therapeutic exercise;Neuromuscular re-education;Patient/family education;Manual techniques;Passive range of motion;Dry needling;Taping    PT Next Visit Plan advance strengtheing and aerobic capacity             Patient will benefit from skilled therapeutic intervention in order to improve the following deficits and impairments:  Abnormal gait, Pain, Increased muscle spasms, Decreased activity tolerance, Decreased endurance, Decreased range of motion, Decreased strength, Impaired flexibility, Difficulty walking, Decreased balance, Decreased safety awareness, Decreased knowledge of precautions  Visit Diagnosis: Other abnormalities of gait and mobility  Unsteadiness on feet  Muscle weakness (generalized)  Pain in left hip  Stiffness of left hip, not elsewhere classified     Problem List Patient Active Problem List   Diagnosis Date Noted   Osteoarthritis, knee 09/24/2019   CKD (chronic kidney disease) stage 4, GFR 15-29 ml/min ([place]) 09/24/2019   Severe recurrent major depression without psychotic features ([place]) 09/09/2019   Bipolar I disorder, most recent episode depressed ([place])    Transaminitis    Essential hypertension    Encephalopathy 04/26/2019   Leukocytosis 04/21/2019   Thrombocytopenia ([place]) 09/04/2018   Vitamin D deficiency 09/04/2018   Psoriatic arthritis ([place]) 09/04/2018   Tubular adenoma of colon 05/28/2017   History of colonic polyps 05/28/2017   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 09/12/2016   Malignant neoplasm of overlapping sites of left breast in female, estrogen receptor positive ([place]) 03/14/2016   Chronic kidney disease, stage III (moderate) 01/19/2016   Tardive dyskinesia 10/18/2015   History of breast cancer  left 2016 12/27/2014   Osteopenia determined by x-ray 10/31/2014   Rosacea 05/21/2007   Bipolar affective disorder, mixed ([place]) 08/16/2004   Acquired hypothyroidism 04/03/1996    Annamarie Major) Lorian Yaun MPT  04/16/2021, 1:05 PM  [facility] [address] [place], [name], [address] Phone: [phone]   Fax:  [phone]  Name: Tiffany [name] MRN: [id number] Date of Birth: [birth year]

## 2021-04-18 ENCOUNTER — Encounter (HOSPITAL_BASED_OUTPATIENT_CLINIC_OR_DEPARTMENT_OTHER): Payer: Self-pay | Admitting: Physical Therapy

## 2021-04-18 ENCOUNTER — Other Ambulatory Visit: Payer: Self-pay

## 2021-04-18 ENCOUNTER — Ambulatory Visit (HOSPITAL_BASED_OUTPATIENT_CLINIC_OR_DEPARTMENT_OTHER): Payer: Medicare Other | Admitting: Physical Therapy

## 2021-04-18 DIAGNOSIS — R2689 Other abnormalities of gait and mobility: Secondary | ICD-10-CM | POA: Diagnosis not present

## 2021-04-18 DIAGNOSIS — R2681 Unsteadiness on feet: Secondary | ICD-10-CM

## 2021-04-18 DIAGNOSIS — M6281 Muscle weakness (generalized): Secondary | ICD-10-CM

## 2021-04-18 DIAGNOSIS — M25652 Stiffness of left hip, not elsewhere classified: Secondary | ICD-10-CM

## 2021-04-18 DIAGNOSIS — M25552 Pain in left hip: Secondary | ICD-10-CM

## 2021-04-19 ENCOUNTER — Encounter (HOSPITAL_BASED_OUTPATIENT_CLINIC_OR_DEPARTMENT_OTHER): Payer: Self-pay | Admitting: Physical Therapy

## 2021-04-19 NOTE — Therapy (Signed)
[facility] [address] [place], [name], [address] Phone: [phone]   Fax:  [phone]  Physical Therapy Treatment/Progress Note   Patient Details  Name: Tiffany [name] MRN: [id number] Date of Birth: 01-21-1951 Referring Provider (PT): Inda Coke, [place]  Progress Note Reporting Period 12/27 to 04/18/2021  See note below for Objective Data and Assessment of Progress/Goals.     Encounter Date: 04/18/2021   PT End of Session - 04/18/21 1157     Visit Number 18    Number of Visits 21    Date for PT Re-Evaluation 05/30/21    Authorization Type progress note performed on visit 18    PT Start Time 1145    PT Stop Time 1228    PT Time Calculation (min) 43 min    Activity Tolerance Patient tolerated treatment well    Behavior During Therapy WFL for tasks assessed/performed             Past Medical History:  Diagnosis Date   Bipolar 1 disorder ([place])    Breast cancer ([place])    Chronic diarrhea    loose stools twice a day on average for years   Chronic kidney disease (CKD), stage III (moderate) ([place])    Depression 1987   Hospitalization or health care facility admission within last 6 months 04/2019   for fall/seizures   Malignant neoplasm of overlapping sites of left breast in female, estrogen receptor positive ([place]) 03/14/2016   Dx in 09/2014, s/p bilateral mastectomies and ALND, 0/10 LN. 1.4 cm  Grade I invasive lobular, ER and PR +/Her--, Ki 67 <5% Tried anastrozole for one month, but developed suicidal idea   Osteoarthritis, knee 09/24/2019   Xray 09/2019   Parkinson's disease ([place]) 2012   Psoriatic arthritis ([place])    PTSD (post-traumatic stress disorder)    Secondary hyperparathyroidism ([place])    Tardive dyskinesia     Past Surgical History:  Procedure Laterality Date   [place] Bilateral 09/19/2014    TONSILLECTOMY  1970   URETERAL REIMPLANTION Bilateral 1974    There were no vitals filed for this visit.   Subjective Assessment - 04/18/21 1147     Subjective Patient reports her legs got sore after the last visit, but she took 2 tylenol and now they are better. She is feeling a bit overwhelmed overall today.    Limitations Standing;Walking;House hold activities    How long can you stand comfortably? Could be immediate or an hour. For sure after an hour.    How long can you walk comfortably? has to use a device for limited distances    Patient Stated Goals Getting stronger, improving balance, pain free    Currently in Pain? No/denies   had had minor pain in the knee               [facility] PT Assessment - 04/19/21 0001       Assessment   Medical Diagnosis M25.552 (ICD-10-CM) - Pain in left hip    Referring Provider (PT) Inda Coke, PA      AROM   Overall AROM Comments -7      Strength   Right Hip Flexion --   22.6   Right Hip ABduction --   25.2   Left Hip Flexion --   16.1   Left Hip ABduction --   25.6   Right  Knee Extension --   26.5   Left Knee Extension --   30.7     Transfers   Five time sit to stand comments  11 sec              LTR: x20 each way   [place]: Gentle Knee extension stretching    Knee: quad set 3x10  SAQ 3x10 bilateral   SLR 2x10 bilateral    Seated hip abduction 3x15; red   Cybex leg press 3x10 40 lbs  Laq 2x10                              PT Education - 04/18/21 1157     Education Details reviewed strength measurements and goals going forward    Person(s) Educated Patient    Methods Demonstration;Explanation;Tactile cues;Verbal cues    Comprehension Returned demonstration;Verbal cues required;Tactile cues required;Verbalized understanding              PT Short Term Goals - 04/19/21 1508       PT SHORT TERM GOAL #1   Title Pt will improve tug with rollater below 14 secs to show improvement in  ambulation.    Baseline 14 seconds 12/28    Time 3    Period Weeks    Status Achieved    Target Date 05/17/21      PT SHORT TERM GOAL #2   Title Patient will improve strength for bilateral hip flexion to 5/5.    Baseline tested with hand dyno today improving in most plains    Time 3    Period Weeks    Status Partially Met    Target Date 05/17/21      PT SHORT TERM GOAL #3   Title Patient will performed 5x sit to stand below 10  secs to show improvement is functional strenght.    Baseline 11    Time 4    Period Weeks    Status On-going    Target Date 05/17/21      PT SHORT TERM GOAL #4   Title Pt will improve 5x sit<>stand to less than or equal to 18 seconds for improved functional strength.    Baseline 16.93 sec 06/30/2019    Time 4    Period Weeks    Status On-going      PT SHORT TERM GOAL #5   Title Pt/husband will verbalize understanding of fall prevention in home environment.    Time 4    Period Weeks    Status On-going    Target Date 05/17/21               PT Long Term Goals - 04/19/21 1509       PT LONG TERM GOAL #1   Title Pt will be be able to perform six minute walk test and ambulate 700 feet without a device or with a less restricted device such as a cane independently in order to ambualte in the community    Baseline qill test next visit    Time 8    Period Weeks    Status On-going    Target Date 06/14/21      PT LONG TERM GOAL #2   Title Pt will go up and down 6 steps without increased pain    Baseline qoerking on steps    Time 6    Period Weeks    Status On-going    Target Date 11/01/20  PT LONG TERM GOAL #3   Title Patient will be independent with pool program and general strengthening program in order to continue to progress mobility    Baseline working on her pool program    Time 6    Period Weeks    Status On-going    Target Date 11/01/20                   Plan - 04/18/21 1213     Clinical Impression Statement  Therapy perfoemwd progress notw on the patient today. Overall she is making great progress. Her strength numbers have improved with all movements except left knee extension but that previous number mayhave been off a bit. She has started pool therapy which she enjoys. She was also able to begin xercises in the gym today. We will continue over the next 6 weeks to get her comfortable and safe in the gym and in the pool. She perfroemd the leg press and the hip abduction machine today without pain. Therapy will continue 2W6 with the plan of discharging to the gym afterr that    Personal Factors and Comorbidities Fitness;Time since onset of injury/illness/exacerbation;Comorbidity 3+    Comorbidities multiple pain locations, back, hip, knee, Decreased mobility post covid and hospitalization last year,    Examination-Activity Limitations Stand;Locomotion Level;Bend;Dressing;Squat;Stairs    Examination-Participation Restrictions Cleaning;Meal Prep;Yard Work;Community Activity;Shop;Laundry    Stability/Clinical Decision Making Evolving/Moderate complexity    Clinical Decision Making Moderate    Rehab Potential Good    PT Frequency 2x / week    PT Duration 6 weeks    PT Treatment/Interventions ADLs/Self Care Home Management;Cryotherapy;Electrical Stimulation;Iontophoresis 62m/ml Dexamethasone;Ultrasound;DME Instruction;Gait training;Stair training;Therapeutic activities;Therapeutic exercise;Neuromuscular re-education;Patient/family education;Manual techniques;Passive range of motion;Dry needling;Taping    PT Next Visit Plan advance strengtheing and aerobic capacity    PT Home Exercise Plan Access Code: [facility] URL: [email or web address]  Date: 09/06/2020  Prepared by: DCarolyne Littles   Exercises  Seated Knee Extension with Resistance - 1 x daily - 7 x weekly - 3 sets - 10 reps  Seated [place] with Resistance - 1 x daily - 7 x weekly - 3 sets - 10 reps  Seated Hip Abduction with Resistance - 1 x daily  - 7 x weekly - 3 sets - 10 reps    Consulted and Agree with Plan of Care Patient             Patient will benefit from skilled therapeutic intervention in order to improve the following deficits and impairments:  Abnormal gait, Pain, Increased muscle spasms, Decreased activity tolerance, Decreased endurance, Decreased range of motion, Decreased strength, Impaired flexibility, Difficulty walking, Decreased balance, Decreased safety awareness, Decreased knowledge of precautions  Visit Diagnosis: Other abnormalities of gait and mobility  Unsteadiness on feet  Muscle weakness (generalized)  Pain in left hip  Stiffness of left hip, not elsewhere classified     Problem List Patient Active Problem List   Diagnosis Date Noted   Osteoarthritis, knee 09/24/2019   CKD (chronic kidney disease) stage 4, GFR 15-29 ml/min ([place] 09/24/2019   Severe recurrent major depression without psychotic features ([place] 09/09/2019   Bipolar I disorder, most recent episode depressed ([place]    Transaminitis    Essential hypertension    Encephalopathy 04/26/2019   Leukocytosis 04/21/2019   Thrombocytopenia ([place] 09/04/2018   Vitamin D deficiency 09/04/2018   Psoriatic arthritis ([place] 09/04/2018   Tubular adenoma of colon 05/28/2017   History of colonic polyps 05/28/2017   Reaction to QuantiFERON-TB  test (QFT) without active tuberculosis 09/12/2016   Malignant neoplasm of overlapping sites of left breast in female, estrogen receptor positive ([place]) 03/14/2016   Chronic kidney disease, stage III (moderate) 01/19/2016   Tardive dyskinesia 10/18/2015   History of breast cancer left 2016 12/27/2014   Osteopenia determined by x-ray 10/31/2014   Rosacea 05/21/2007   Bipolar affective disorder, mixed ([place]) 08/16/2004   Acquired hypothyroidism 04/03/1996    Carney Living, PT 04/19/2021, 3:15 PM  [facility] [address] [place], [name],  [address] Phone: [phone]   Fax:  [phone]  Name: Tiffany [name] MRN: [id number] Date of Birth: 09/08/1950

## 2021-04-20 ENCOUNTER — Ambulatory Visit (INDEPENDENT_AMBULATORY_CARE_PROVIDER_SITE_OTHER): Payer: Medicare Other | Admitting: Psychiatry

## 2021-04-20 ENCOUNTER — Other Ambulatory Visit: Payer: Self-pay

## 2021-04-20 DIAGNOSIS — R41840 Attention and concentration deficit: Secondary | ICD-10-CM

## 2021-04-20 DIAGNOSIS — F4310 Post-traumatic stress disorder, unspecified: Secondary | ICD-10-CM | POA: Diagnosis not present

## 2021-04-20 DIAGNOSIS — Z630 Problems in relationship with spouse or partner: Secondary | ICD-10-CM | POA: Diagnosis not present

## 2021-04-20 DIAGNOSIS — U099 Post covid-19 condition, unspecified: Secondary | ICD-10-CM

## 2021-04-20 DIAGNOSIS — F3132 Bipolar disorder, current episode depressed, moderate: Secondary | ICD-10-CM

## 2021-04-20 DIAGNOSIS — F4010 Social phobia, unspecified: Secondary | ICD-10-CM

## 2021-04-20 DIAGNOSIS — Z8669 Personal history of other diseases of the nervous system and sense organs: Secondary | ICD-10-CM

## 2021-04-20 NOTE — Progress Notes (Signed)
Psychotherapy Progress Note [facility] [name], PhD LP  Patient ID: Tiffany [name])    MRN: [id number] Therapy format: Individual psychotherapy Date: 04/20/2021      Start: 2:10p     Stop: 2:58p     Time Spent: 48 min Location: In-person   Session narrative (presenting needs, interim history, self-report of stressors and symptoms, applications of prior therapy, status changes, and interventions made in session) Alone today, back on walker for steadiness.  Tough week with anger.  At its zenith, she was up on the computer a couple nights ago after [name] was in bed (he typically goes down 8pm), and his whispering her name annoyed her.  Became almost dissociatively angry, felt possessed, imagining what knife she would use on her own throat in response.  Happened about 9pm, when she goes to bed 10/11pm.  Eventually reveals he has also been pestering awkwardly for sex, says she thought it was supposed to just disappear by this age and acknowledges discomfort that [name] still wants.  Interpreted PTSD at work again, that it aggravates her internal dilemmas about whether to be available and potentially feel used.  Encouraged she be more forthcoming about her issues with [name], talk about it by day, not under the gun.  Re. sexual policy, validated that she really can only abide intimacy when either (a) fully interested to participate, or (b) fully caring to make it a gift to [place], and that she can explain it as a "deep brain" reaction to feel averse to it and that her deep brain will free her up when it gets signal enough she is safe and free.  Noted that [name]'s attempt to be careful/respectful by whispering actually comes off a little creepy instead, so it may be that a normal tone of voice would help.  Has had a similar sensitivity with church, where sermons have abruptly introduced evocative subjects and made her feel reluctant to be there lest she be jolted again.  Felt  jolted when [place] told her recently she has a different PT for aquatherapy in a couple weeks.  Also female, according to EHR, but nevertheless scared to lose her comfortable presence.  Discussed likely case that regular will be out and went ahead to schedule her with another female so as not to lose service, but the usual process may assume freedom to reassign.  Encouraged to go ahead and ask about it next encounter, despite irrational fear PT might be angry.  Sore feelings noted as well with her female (gym-based) PT.  Says she feels he's "bored" with her, and maybe rushing her to terminate, since he has said things about how the gym is available on her own and she'll have to do it on her own someday.  Discussed likely considerations in PT and how safe it probably is to engage him, let him know how some phrases come across, use perception checking to clarify intent.  Therapeutic modalities: Cognitive Behavioral Therapy and Solution-Oriented/Positive Psychology  Mental Status/Observations:  Appearance:   Casual and Neat     Behavior:  Appropriate  Motor:  Normal and except walker  Speech/Language:   Clear and Coherent  Affect:  Appropriate  Mood:  anxious and dysthymic  Thought process:  normal  Thought content:    worry  Sensory/Perceptual disturbances:    WNL  Orientation:  Fully oriented  Attention:  Good    Concentration:  Good  Memory:  grossly intact  Insight:    Fair  Judgment:   Good  Impulse Control:  Good   Risk Assessment: Danger to Self: No Self-injurious Behavior: No Danger to Others: No Physical Aggression / Violence: No Duty to Warn: No Access to Firearms a concern: No  Assessment of progress:  situational setback(s)  Diagnosis:   ICD-10-CM   1. PTSD (post-traumatic stress disorder)  F43.10     2. Social anxiety disorder  F40.10     3. Bipolar I disorder, most recent episode depressed, moderate ([place])  F31.32     4. Relationship problem between partners  Z63.0      5. 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 History of metabolic encephalopathy  K34.91      Plan:  Encourage perception-checking with both PTs Encourage brave and frank conversation with [name] about sexual expectations Encouraged to spend up time after [name]'s bedtime out of the bedroom out of respect for light, sound, and mixed signals for availability as well as stimulus control for sleep readiness Other recommendations/advice as may be noted above May need further pharmacological consideration to reduce lability under duress Continue to utilize previously learned skills ad lib Maintain medication as prescribed and work faithfully with relevant prescriber(s) if any changes are desired or seem indicated Call the clinic on-call service, 988/hotline, 911, or present to [facility] or ER if any life-threatening psychiatric crisis Return for session(s) already scheduled. Already scheduled visit in this office 04/25/2021.  [name], PhD [name], PhD LP Clinical Psychologist, [facility] [address], [place], [place] [address] [phone]

## 2021-04-23 ENCOUNTER — Ambulatory Visit (HOSPITAL_BASED_OUTPATIENT_CLINIC_OR_DEPARTMENT_OTHER): Payer: Medicare Other | Admitting: Physical Therapy

## 2021-04-23 ENCOUNTER — Encounter (HOSPITAL_BASED_OUTPATIENT_CLINIC_OR_DEPARTMENT_OTHER): Payer: Self-pay | Admitting: Physical Therapy

## 2021-04-23 ENCOUNTER — Other Ambulatory Visit: Payer: Self-pay

## 2021-04-23 DIAGNOSIS — R2681 Unsteadiness on feet: Secondary | ICD-10-CM

## 2021-04-23 DIAGNOSIS — M6281 Muscle weakness (generalized): Secondary | ICD-10-CM

## 2021-04-23 DIAGNOSIS — M25652 Stiffness of left hip, not elsewhere classified: Secondary | ICD-10-CM

## 2021-04-23 DIAGNOSIS — R2689 Other abnormalities of gait and mobility: Secondary | ICD-10-CM

## 2021-04-23 DIAGNOSIS — M25552 Pain in left hip: Secondary | ICD-10-CM

## 2021-04-23 NOTE — Therapy (Signed)
[place], [name], [address] Phone: [phone]   Fax:  [phone]  Physical Therapy Treatment/Progress Note   Patient Details  Name: Tiffany [name] MRN: [id number] Date of Birth: Jul 28, 1950 Referring Provider (PT): Inda Coke, [place]    Encounter Date: 04/23/2021   PT End of Session - 04/23/21 1129     Visit Number 19    Number of Visits 21    Date for PT Re-Evaluation 05/30/21    Authorization Type progress note performed on visit 18    PT Start Time 1116    PT Stop Time 1200    PT Time Calculation (min) 44 min    Activity Tolerance Patient tolerated treatment well    Behavior During Therapy [facility] for tasks assessed/performed             Past Medical History:  Diagnosis Date   Bipolar 1 disorder ([place])    Breast cancer ([place])    Chronic diarrhea    loose stools twice a day on average for years   Chronic kidney disease (CKD), stage III (moderate) ([place])    Depression 1987   Hospitalization or health care facility admission within last 6 months 04/2019   for fall/seizures   Malignant neoplasm of overlapping sites of left breast in female, estrogen receptor positive ([place]) 03/14/2016   Dx in 09/2014, s/p bilateral mastectomies and ALND, 0/10 LN. 1.4 cm  Grade I invasive lobular, ER and PR +/Her--, Ki 67 <5% Tried anastrozole for one month, but developed suicidal idea   Osteoarthritis, knee 09/24/2019   Xray 09/2019   Parkinson's disease ([place]) 2012   Psoriatic arthritis ([place])    PTSD (post-traumatic stress disorder)    Secondary hyperparathyroidism ([place])    Tardive dyskinesia     Past Surgical History:  Procedure Laterality Date   [place] Bilateral 09/19/2014   TONSILLECTOMY  1970   URETERAL REIMPLANTION Bilateral 1974    There were no vitals filed for this visit.   Subjective Assessment  - 04/23/21 1131     Subjective "I am very, very, very greatful for the therapy because my knee is so much better and I can walk where before I could not"    Pain Score 3     Pain Location Generalized    Pain Orientation Right;Left    Pain Descriptors / Indicators Aching    Pain Type Chronic pain    Pain Onset More than a month ago             Pt seen for aquatic therapy today.  Treatment took place in water 3.25-4.8 ft in depth at the [employer] pool. Temp of water was 93.  Pt entered/exited the pool via stairs  combination ofstep to/through pattern independently with bilat rail.   Walking for warm up back and forth by bench to improve confidence in ability.  Pt amb fwd, back and side stepping with distance sup.  Cues for increased speed for  increased resistance Reviewed current function, pain levels, response to prior Rx, and HEP compliance.    Seated 4th water step: stretching hamstrings and gastroc Flutter kicking 3 x 25 reps Add/abd 3x20  STS 4th  water step x 10.  Pt rising gaining immediate standing balance indep 8/10. (Improvement from 4/10 last visit)  Balance challenges holding hand buoys: Tandem stance R/L with then without  ue support able to hold after several tries x 15 seconds SLS with ue support R/L several tries x 12 seconds best.  Without ue support best x 5  Standing -step ups forward R/L x12, backward x 10, side stepping to right x 10. Cues for abdominal bracing and weight shifting. Progressed from ue support to no support.  Stair climbing using alternating pattern.                                          PT Short Term Goals - 04/19/21 1508       PT SHORT TERM GOAL #1   Title Pt will improve tug with rollater below 14 secs to show improvement in ambulation.    Baseline 14 seconds 12/28    Time 3    Period Weeks    Status Achieved    Target Date 05/17/21      PT SHORT TERM GOAL #2   Title Patient will  improve strength for bilateral hip flexion to 5/5.    Baseline tested with hand dyno today improving in most plains    Time 3    Period Weeks    Status Partially Met    Target Date 05/17/21      PT SHORT TERM GOAL #3   Title Patient will performed 5x sit to stand below 10  secs to show improvement is functional strenght.    Baseline 11    Time 4    Period Weeks    Status On-going    Target Date 05/17/21      PT SHORT TERM GOAL #4   Title Pt will improve 5x sit<>stand to less than or equal to 18 seconds for improved functional strength.    Baseline 16.93 sec 06/30/2019    Time 4    Period Weeks    Status On-going      PT SHORT TERM GOAL #5   Title Pt/husband will verbalize understanding of fall prevention in home environment.    Time 4    Period Weeks    Status On-going    Target Date 05/17/21               PT Long Term Goals - 04/19/21 1509       PT LONG TERM GOAL #1   Title Pt will be be able to perform six minute walk test and ambulate 700 feet without a device or with a less restricted device such as a cane independently in order to ambualte in the community    Baseline qill test next visit    Time 8    Period Weeks    Status On-going    Target Date 06/14/21      PT LONG TERM GOAL #2   Title Pt will go up and down 6 steps without increased pain    Baseline qoerking on steps    Time 6    Period Weeks    Status On-going    Target Date 11/01/20      PT LONG TERM GOAL #3   Title Patient will be independent with pool program and general strengthening program in order to continue to progress mobility    Baseline working on her pool program    Time 6    Period Weeks    Status On-going    Target Date 11/01/20  Plan - 04/23/21 1136     Clinical Impression Statement Pt arrives and enters pool with increased confidence.  Amb thorughout with increased pace, no LOB, normal gati pattern indep. She reports slight increase in pain but  believes its from bad weather yesterday. Was able to sit and rise from sofa and church pew over week end without difficulty and indep.  She is making excellent progress  All prescribed activities completed today well and without manual assist. Advanced balance challenges increasing core engagement for decrease in fall risk.    Stability/Clinical Decision Making Evolving/Moderate complexity    Clinical Decision Making Moderate    Rehab Potential Good    PT Duration 6 weeks    PT Next Visit Plan advance strengtheing and aerobic capacity             Patient will benefit from skilled therapeutic intervention in order to improve the following deficits and impairments:  Abnormal gait, Pain, Increased muscle spasms, Decreased activity tolerance, Decreased endurance, Decreased range of motion, Decreased strength, Impaired flexibility, Difficulty walking, Decreased balance, Decreased safety awareness, Decreased knowledge of precautions  Visit Diagnosis: Other abnormalities of gait and mobility  Unsteadiness on feet  Muscle weakness (generalized)  Pain in left hip  Stiffness of left hip, not elsewhere classified     Problem List Patient Active Problem List   Diagnosis Date Noted   Osteoarthritis, knee 09/24/2019   CKD (chronic kidney disease) stage 4, GFR 15-29 ml/min (HCC) 09/24/2019   Severe recurrent major depression without psychotic features ([place]) 09/09/2019   Bipolar I disorder, most recent episode depressed ([place])    Transaminitis    Essential hypertension    Encephalopathy 04/26/2019   Leukocytosis 04/21/2019   Thrombocytopenia ([place]) 09/04/2018   Vitamin D deficiency 09/04/2018   Psoriatic arthritis ([place]) 09/04/2018   Tubular adenoma of colon 05/28/2017   History of colonic polyps 05/28/2017   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, [name], [address] Phone: [phone]   Fax:  [phone]  Name: Tiffany [name] MRN: [id number] Date of Birth: 21-Sep-1950

## 2021-04-25 ENCOUNTER — Ambulatory Visit (INDEPENDENT_AMBULATORY_CARE_PROVIDER_SITE_OTHER): Payer: Medicare Other | Admitting: Adult Health

## 2021-04-25 ENCOUNTER — Encounter: Payer: Self-pay | Admitting: Adult Health

## 2021-04-25 ENCOUNTER — Other Ambulatory Visit: Payer: Self-pay

## 2021-04-25 DIAGNOSIS — F411 Generalized anxiety disorder: Secondary | ICD-10-CM

## 2021-04-25 DIAGNOSIS — F4310 Post-traumatic stress disorder, unspecified: Secondary | ICD-10-CM | POA: Diagnosis not present

## 2021-04-25 DIAGNOSIS — F3132 Bipolar disorder, current episode depressed, moderate: Secondary | ICD-10-CM | POA: Diagnosis not present

## 2021-04-25 DIAGNOSIS — F99 Mental disorder, not otherwise specified: Secondary | ICD-10-CM

## 2021-04-25 DIAGNOSIS — Z79899 Other long term (current) drug therapy: Secondary | ICD-10-CM

## 2021-04-25 DIAGNOSIS — F5105 Insomnia due to other mental disorder: Secondary | ICD-10-CM

## 2021-04-25 MED ORDER — PRAZOSIN HCL 1 MG PO CAPS: ORAL_CAPSULE | ORAL | 0 refills | Status: DC

## 2021-04-25 NOTE — Progress Notes (Signed)
Tiffany [name] [id number] [birth year] 71 y.o.  Subjective:   Patient ID:  Tiffany [name] is a 71 y.o. (DOB 1950/07/27) female.  Chief Complaint: No chief complaint on file.   HPI Tiffany [name] presents to the office today for follow-up of PTSD, insomnia, GAD, BPD 1.  Describes mood today as "ok". Pleasant. Tearful at times. Mood symptoms - denies depression, anxiety and irritability. Mood stable. Reports some negative thoughts. Stating "I'm doing ok". Working with P/T - doing aquatics. Concerned about her daughter. Feels like current medications are helpful. Seeing Dr. Rica Mote weekly. Varying interest and motivation. Taking medications as prescribed. Energy levels vary. Active, does not have a regular exercise routine. Going to P/T twice a week. Enjoys some usual interests and activities. Married. Lives with husband their daughter. Talking to family and friends.  Appetite adequate. Weight loss.   Sleeps well most nights. Averages 9 to 10 hours - "bathroom". Focus and concentration stable. Completing tasks. Managing some aspects of household. Retired.  Denies SI - "no plans or intents". Denies HI.  Denies AH Denies VH  Reports flashbacks daily - not debilitating.  Previous medications: Celexa, Zyprexa, Tegretol, Depakote, Serzone, Topamax, Seroquel, Effexor, Lexapro, Desipramine, Neurontin, Abilify, Geodon, Propanolol, Cymbalta, Cogentin, Trihexyphenadyl, Sinmmet, Provigil, Selegiline, Requip, Amantadine, Prozac, Mirapex, Azilect, Metoclopramide, Baclofen, Artane, Namenda, Latuda.    [place] Office Visit from 02/16/2021 in [place] Visit from 05/14/2019 in [place]  Total GAD-7 Score 13 [place] Office Visit from 06/07/2019 in [facility] [place]  Total Score (max 30 points ) 29      PHQ2-9    Flowsheet Row Clinical Support from 04/02/2021 in [place] Visit from 02/16/2021 in [facility] from 07/26/2020 in [place] Visit from 06/05/2020 in [facility] from 03/16/2020 in [place] [facility]  PHQ-2 Total Score 1 3 1 4 2   PHQ-9 Total Score 1 6 -- 11 3      Flowsheet Row Clinical Support from 04/02/2021 in [facility] from 03/21/2021 in [facility] ED from 02/03/2021 in [facility] [place] No Risk No Risk No Risk        Review of Systems:  Review of Systems  Musculoskeletal:  Negative for gait problem.  Neurological:  Negative for tremors.  Psychiatric/Behavioral:         Please refer to HPI   Medications: I have reviewed the patient's current medications.  Current Outpatient Medications  Medication Sig Dispense Refill   Certolizumab Pegol (CIMZIA) 2 X 200 MG KIT See admin instructions.     cholecalciferol (VITAMIN D3) 25 MCG (1000 UNIT) tablet Take 1,000 Units by mouth daily.     lamoTRIgine (LAMICTAL) 200 MG tablet Take 1 tablet (200 mg total) by mouth at bedtime. 90 tablet 3   levothyroxine (SYNTHROID) 112 MCG tablet Take 1 tablet (112 mcg total) by mouth daily before breakfast. 90 tablet 0   lithium carbonate 300 MG capsule TAKE 1 CAPSULE(300 MG) BY MOUTH AT BEDTIME 90 capsule 3   LORazepam (ATIVAN) 1 MG tablet TAKE 1 TABLET(1 MG) BY MOUTH TWICE DAILY AS NEEDED 60 tablet 2   metroNIDAZOLE (METROGEL) 0.75 % gel Apply to face 1-2 times daily 45 g 0   prazosin (MINIPRESS) 1 MG capsule Take one  capsule at bedtime for two weeks and then discontinue. 14 capsule 0   sodium bicarbonate 650 MG tablet Take 650 mg by mouth daily.     No current facility-administered medications for this visit.    Medication Side Effects: None  Allergies:  Allergies  Allergen Reactions   Aripiprazole Other (See  Comments)    Parkinsonism      Lactose Intolerance (Gi) Diarrhea   Methotrexate Other (See Comments)    Hair loss, severe stomatitis     Cefdinir Diarrhea    Other reaction(s): Diarrhea Yeast infection and fever; negative c diff   Etanercept Other (See Comments)    Headaches     Exemestane Other (See Comments)    Suicidal thoughts with medication     Fluoxetine Other (See Comments)    Parkinsonism   Lactose     Other reaction(s): diarrhea Other reaction(s): diarrhea Other reaction(s): diarrhea   Methylprednisolone Sodium Succ Other (See Comments)    Agitated mania   Epinephrine Palpitations    tachycardia    Nitrofurantoin Nausea And Vomiting and Rash      Other reaction(s): rash, diarrhea Other reaction(s): rash, diarrhea Other reaction(s): rash, diarrhea Other reaction(s): rash, diarrhea Other reaction(s): rash, diarrhea    Past Medical History:  Diagnosis Date   Bipolar 1 disorder (HCC)    Breast cancer (HCC)    Chronic diarrhea    loose stools twice a day on average for years   Chronic kidney disease (CKD), stage III (moderate) ([place])    Depression 1987   Hospitalization or health care facility admission within last 6 months 04/2019   for fall/seizures   Malignant neoplasm of overlapping sites of left breast in female, estrogen receptor positive ([place]) 03/14/2016   Dx in 09/2014, s/p bilateral mastectomies and ALND, 0/10 LN. 1.4 cm  Grade I invasive lobular, ER and PR +/Her--, Ki 67 <5% Tried anastrozole for one month, but developed suicidal idea   Osteoarthritis, knee 09/24/2019   Xray 09/2019   Parkinson's disease ([place]) 2012   Psoriatic arthritis ([place])    PTSD (post-traumatic stress disorder)    Secondary hyperparathyroidism ([place])    Tardive dyskinesia     Past Medical History, Surgical history, Social history, and Family history were reviewed and updated as appropriate.   Please see review of systems for further details on the patient's review from  today.   Objective:   Physical Exam:  There were no vitals taken for this visit.  Physical Exam Constitutional:      General: She is not in acute distress. Musculoskeletal:        General: No deformity.  Neurological:     Mental Status: She is alert and oriented to person, place, and time.     Coordination: Coordination normal.  Psychiatric:        Attention and Perception: Attention and perception normal. She does not perceive auditory or visual hallucinations.        Mood and Affect: Mood normal. Mood is not anxious or depressed. Affect is not labile, blunt, angry or inappropriate.        Speech: Speech normal.        Behavior: Behavior normal.        Thought Content: Thought content normal. Thought content is not paranoid or delusional. Thought content does not include homicidal or suicidal ideation. Thought content does not include homicidal or suicidal plan.        Cognition and Memory: Cognition and memory normal.  Judgment: Judgment normal.     Comments: Insight intact    Lab Review:     Component Value Date/Time   NA 142 03/21/2021 1123   NA 138 06/08/2019 0000   K 4.0 03/21/2021 1123   CL 109 03/21/2021 1123   CO2 24 03/21/2021 1123   GLUCOSE 83 03/21/2021 1123   BUN 30 (H) 03/21/2021 1123   BUN 29 (A) 06/08/2019 0000   CREATININE 2.28 (H) 03/21/2021 1123   CREATININE 2.03 (H) 02/02/2021 1012   CALCIUM 9.7 03/21/2021 1123   PROT 7.8 03/21/2021 1123   ALBUMIN 4.5 03/21/2021 1123   AST 15 03/21/2021 1123   ALT 12 03/21/2021 1123   ALKPHOS 106 03/21/2021 1123   BILITOT 1.2 03/21/2021 1123   GFRNONAA 22 (L) 03/21/2021 1123   GFRAA 25 (L) 09/13/2019 1243       Component Value Date/Time   WBC 8.7 03/21/2021 1123   RBC 4.64 03/21/2021 1123   HGB 13.5 03/21/2021 1123   HCT 43.9 03/21/2021 1123   PLT 225 03/21/2021 1123   MCV 94.6 03/21/2021 1123   MCH 29.1 03/21/2021 1123   MCHC 30.8 03/21/2021 1123   RDW 14.2 03/21/2021 1123   LYMPHSABS 3.1  03/21/2021 1123   MONOABS 0.5 03/21/2021 1123   EOSABS 0.3 03/21/2021 1123   BASOSABS 0.1 03/21/2021 1123    Lithium Lvl  Date Value Ref Range Status  02/02/2021 0.6 0.6 - 1.2 mmol/L Final     No results found for: PHENYTOIN, PHENOBARB, VALPROATE, CBMZ   .res Assessment: Plan:     Plan:  Therapist - Andy Mitchum   Lorazepam 1mg  BID for anxiety - make take one tablet extra for severe anxiety symptoms. Taking one at night routinely. Lamictal 200mg  at hs Lithium 300mg  daily  Prazosin 1mg  at hs x 14 days then leave off.  Labs - lithium level, TSH, CMP  RTC 4/6 weeks  Counseled patient regarding potential benefits, risks, and side effects of Lamictal to include potential risk of Stevens-Johnson syndrome. Advised patient to stop taking Lamictal and contact office immediately if rash develops and to seek urgent medical attention if rash is severe and/or spreading quickly.  Discussed potential benefits, risk, and side effects of benzodiazepines to include potential risk of tolerance and dependence, as well as possible drowsiness.  Advised patient not to drive if experiencing drowsiness and to take lowest possible effective dose to minimize risk of dependence and tolerance.   Diagnoses and all orders for this visit:  Diagnoses and all orders for this visit:  PTSD (post-traumatic stress disorder) -     prazosin (MINIPRESS) 1 MG capsule; Take one capsule at bedtime for two weeks and then discontinue.  Bipolar I disorder, most recent episode depressed, moderate (HCC)  Generalized anxiety disorder  Insomnia due to other mental disorder  High risk medication use -     TSH -     Lithium level -     Basic metabolic panel     Please see After Visit Summary for patient specific instructions.  Future Appointments  Date Time Provider [place]  04/26/2021 11:00 AM Carney Living, PT [facility] DW[facility]  04/26/2021  4:00 PM [name], PhD CP-CP None  04/30/2021 11:15 AM  Vedia Pereyra, PT [facility] DWB  05/02/2021 11:45 AM Carney Living, PT [facility] DWB  05/04/2021 11:20 AM GI-BCG [place] 3 GI-BCGUS GI-BREAST CE  05/07/2021 11:00 AM Shelbie Hutching DW[facility]-RE[facility] DWB  05/07/2021  3:20 PM Thornton Park, MD LBGI-GI [facility]  05/08/2021 11:00 AM [name], PhD CP-CP None  05/09/2021 11:45 AM Carney Living, PT [facility] DWB  05/25/2021 10:00 AM [name], PhD CP-CP None  05/31/2021  1:00 PM [name], PhD CP-CP None  06/08/2021 11:00 AM [name], PhD CP-CP None  07/09/2021 11:00 AM Inda Coke, PA [facility] PEC  04/15/2022  1:45 PM [facility] HEALTH COACH [facility] PEC    Orders Placed This Encounter  Procedures   TSH   Lithium level   Basic metabolic panel    -------------------------------

## 2021-04-26 ENCOUNTER — Ambulatory Visit (HOSPITAL_BASED_OUTPATIENT_CLINIC_OR_DEPARTMENT_OTHER): Payer: Medicare Other | Admitting: Physical Therapy

## 2021-04-26 ENCOUNTER — Encounter (HOSPITAL_BASED_OUTPATIENT_CLINIC_OR_DEPARTMENT_OTHER): Payer: Self-pay | Admitting: Physical Therapy

## 2021-04-26 ENCOUNTER — Ambulatory Visit (INDEPENDENT_AMBULATORY_CARE_PROVIDER_SITE_OTHER): Payer: Medicare Other | Admitting: Psychiatry

## 2021-04-26 DIAGNOSIS — F411 Generalized anxiety disorder: Secondary | ICD-10-CM | POA: Diagnosis not present

## 2021-04-26 DIAGNOSIS — F3132 Bipolar disorder, current episode depressed, moderate: Secondary | ICD-10-CM | POA: Diagnosis not present

## 2021-04-26 DIAGNOSIS — R2681 Unsteadiness on feet: Secondary | ICD-10-CM

## 2021-04-26 DIAGNOSIS — Z630 Problems in relationship with spouse or partner: Secondary | ICD-10-CM

## 2021-04-26 DIAGNOSIS — F4310 Post-traumatic stress disorder, unspecified: Secondary | ICD-10-CM | POA: Diagnosis not present

## 2021-04-26 DIAGNOSIS — M6281 Muscle weakness (generalized): Secondary | ICD-10-CM

## 2021-04-26 DIAGNOSIS — M25652 Stiffness of left hip, not elsewhere classified: Secondary | ICD-10-CM

## 2021-04-26 DIAGNOSIS — R2689 Other abnormalities of gait and mobility: Secondary | ICD-10-CM

## 2021-04-26 DIAGNOSIS — M25552 Pain in left hip: Secondary | ICD-10-CM

## 2021-04-26 DIAGNOSIS — F4010 Social phobia, unspecified: Secondary | ICD-10-CM

## 2021-04-26 NOTE — Progress Notes (Incomplete)
Psychotherapy Progress Note [facility] Tiffany Moore, PhD LP  Patient ID: Tiffany [name])    MRN: [id number] Therapy format: {Therapy Types:21967::"Individual psychotherapy"} Date: 04/26/2021      Start: ***:***     Stop: ***:***     Time Spent: *** min Location: {SvcLoc:22530::"In-person"}   Session narrative (presenting needs, interim history, self-report of stressors and symptoms, applications of prior therapy, status changes, and interventions made in session) Had the conversation with aqua PT Tharon Aquas), and she was so astute she answered her question without it being asked, just on the mention of seeing another name.  Realizes on reflection she frets so hard about what could happen that she can spook herself.  Well pleased with the working relationships with both Tharon Aquas and Waunita Schooner, impressed with their sensitivity, skills, and willingness to explain.  Also queried Dave's comment that "you can't do this for the rest of your life" and got very clear, reassuring response.  Glad she tried these.  Grandchildren dropped off yesterday, as [name] and Gosnell travel (in her 9th month of pregnancy).  It's lively in the house, which she likes, and notes that stress for her have to do with "unknowns", e.g., about [name]'s 15yo son Yvone Neu, first time he's ever spent the night.  Has noticed how she may imagine the worse, e.g., Yvone Neu sneaking out a window or sneaking friend in (in response to him offering to disarm the security system).  Knows she will still maintain some vigilance about Yvone Neu, trying to uphold her own sense of responsibility and impress her hard-to-read daughter-in-law.    With [name], she did engage him at bedtime Friday night to clue him in to how she can feel "attacked" but knows better about him, i.e., reaching out and startling her by touching her.  Spontaneously the next day offered [name] some private time, with invitation to talk and relate before just going to physical, found  herself turned on, and effectively invited him to bed.  While he was unmistakably eager to accept, he took over an hour to come to bed, turned out to be an extended errand trying to sew on a button.  Used up so much of their window of privacy they couldn't, but turned out to be quite the exercise in assertiveness and grace, both.  Now considering   Also took the advice about blue light control and segregating electronics from bed space, which helps her feel.   Medically, down to 1mg  prazosin, tapering completely.  Says she has been free of nightmares lately, but still does have flashback-like experiences daytime, best described as abrupt, very brief, childlike reactions  Therapeutic modalities: {AM:23362::"Cognitive Behavioral Therapy","Solution-Oriented/Positive Psychology"}  Mental Status/Observations:  Appearance:   {PSY:22683}     Behavior:  {PSY:21022743}  Motor:  {PSY:22302}  Speech/Language:   {PSY:22685}  Affect:  {PSY:22687}  Mood:  {PSY:31886}  Thought process:  {PSY:31888}  Thought content:    {PSY:224-056-6335}  Sensory/Perceptual disturbances:    {PSY:9103105654}  Orientation:  {Psych Orientation:23301::"Fully oriented"}  Attention:  {Good-Fair-Poor ratings:23770::"Good"}    Concentration:  {Good-Fair-Poor ratings:23770::"Good"}  Memory:  {PSY:985-619-1447}  Insight:    {Good-Fair-Poor ratings:23770::"Good"}  Judgment:   {Good-Fair-Poor ratings:23770::"Good"}  Impulse Control:  {Good-Fair-Poor ratings:23770::"Good"}   Risk Assessment: Danger to Self: {Risk:22599::"No"} Self-injurious Behavior: {Risk:22599::"No"} Danger to Others: {Risk:22599::"No"} Physical Aggression / Violence: {Risk:22599::"No"} Duty to Warn: {AMYesNo:22526::"No"} Access to Firearms a concern: {AMYesNo:22526::"No"}  Assessment of progress:  {Progress:22147::"progressing"}  Diagnosis: No diagnosis found. Plan:  Encourage  Other recommendations/advice as may be  noted above Continue to utilize  previously learned skills ad lib Maintain medication as prescribed and work faithfully with relevant prescriber(s) if any changes are desired or seem indicated Call the clinic on-call service, 988/hotline, 911, or present to [facility] or ER if any life-threatening psychiatric crisis No follow-ups on file. Already scheduled visit in this office 05/08/2021.  [name], PhD Tiffany Moore, PhD LP Clinical Psychologist, [facility] [address] [place], [place] [address] [phone]

## 2021-04-26 NOTE — Therapy (Signed)
[facility] [address] [place], [name], [address] Phone: [phone]   Fax:  [phone]  Physical Therapy Treatment  Patient Details  Name: Tiffany [name] MRN: [id number] Date of Birth: May 18, 1950 Referring Provider (PT): Inda Coke, [place]   Encounter Date: 04/26/2021   PT End of Session - 04/26/21 1238     Visit Number 20    Number of Visits 30    Date for PT Re-Evaluation 05/30/21    Authorization Type progress note performed on visit 18    PT Start Time 1106   therapist getting out of pool   PT Stop Time 1147    PT Time Calculation (min) 41 min    Activity Tolerance Patient tolerated treatment well    Behavior During Therapy [facility] for tasks assessed/performed             Past Medical History:  Diagnosis Date   Bipolar 1 disorder ([place])    Breast cancer ([place])    Chronic diarrhea    loose stools twice a day on average for years   Chronic kidney disease (CKD), stage III (moderate) ([place])    Depression 1987   Hospitalization or health care facility admission within last 6 months 04/2019   for fall/seizures   Malignant neoplasm of overlapping sites of left breast in female, estrogen receptor positive ([place]) 03/14/2016   Dx in 09/2014, s/p bilateral mastectomies and ALND, 0/10 LN. 1.4 cm  Grade I invasive lobular, ER and PR +/Her--, Ki 67 <5% Tried anastrozole for one month, but developed suicidal idea   Osteoarthritis, knee 09/24/2019   Xray 09/2019   Parkinson's disease ([place]) 2012   Psoriatic arthritis ([place])    PTSD (post-traumatic stress disorder)    Secondary hyperparathyroidism ([place])    Tardive dyskinesia     Past Surgical History:  Procedure Laterality Date   [place] Bilateral 09/19/2014   TONSILLECTOMY  1970   URETERAL REIMPLANTION Bilateral 1974    There were no vitals filed for this visit.    Subjective Assessment - 04/26/21 1129     Subjective The patient went shopping yesterday and did a lot of chores at home. She feels like her legs are sore today. She feels sharp pains a timesin several muscle groups.    Limitations Standing;Walking;House hold activities    How long can you sit comfortably? no issues    How long can you stand comfortably? Could be immediate or an hour. For sure after an hour.    How long can you walk comfortably? has to use a device for limited distances    Patient Stated Goals Getting stronger, improving balance, pain free    Currently in Pain? Yes    Pain Score 5     Pain Location Leg    Pain Orientation Right;Left    Pain Descriptors / Indicators Aching    Pain Type Chronic pain    Pain Onset More than a month ago    Pain Frequency Constant    Aggravating Factors  standing and walking    Pain Relieving Factors light stretching    Effect of Pain on Daily Activities limits daily activity    Multiple Pain Sites No               LTR: x20 each way   Manaul: Gentle Knee extension stretching    Knee: quad set  3x10  SAQ 3x10 bilateral    SLR 2x10 bilateral fatigued on the last set    Seated hip abduction 3x15; red   Cybex leg press held today 2nd to leg soreness  Bilateral er red 2x10  Horizontal abduction red 2x10  Shoulder flexion 1 lb 2x10   Row 2x10 red with cuing for breathing Shoulder extension red 2x10                           PT Education - 04/26/21 1226     Education Details expected pain levels as she ramps up activity    Person(s) Educated Patient    Methods Explanation;Demonstration;Tactile cues;Verbal cues    Comprehension Verbalized understanding;Returned demonstration;Verbal cues required;Tactile cues required              PT Short Term Goals - 04/19/21 1508       PT SHORT TERM GOAL #1   Title Pt will improve tug with rollater below 14 secs to show improvement in ambulation.     Baseline 14 seconds 12/28    Time 3    Period Weeks    Status Achieved    Target Date 05/17/21      PT SHORT TERM GOAL #2   Title Patient will improve strength for bilateral hip flexion to 5/5.    Baseline tested with hand dyno today improving in most plains    Time 3    Period Weeks    Status Partially Met    Target Date 05/17/21      PT SHORT TERM GOAL #3   Title Patient will performed 5x sit to stand below 10  secs to show improvement is functional strenght.    Baseline 11    Time 4    Period Weeks    Status On-going    Target Date 05/17/21      PT SHORT TERM GOAL #4   Title Pt will improve 5x sit<>stand to less than or equal to 18 seconds for improved functional strength.    Baseline 16.93 sec 06/30/2019    Time 4    Period Weeks    Status On-going      PT SHORT TERM GOAL #5   Title Pt/husband will verbalize understanding of fall prevention in home environment.    Time 4    Period Weeks    Status On-going    Target Date 05/17/21               PT Long Term Goals - 04/19/21 1509       PT LONG TERM GOAL #1   Title Pt will be be able to perform six minute walk test and ambulate 700 feet without a device or with a less restricted device such as a cane independently in order to ambualte in the community    Baseline qill test next visit    Time 8    Period Weeks    Status On-going    Target Date 06/14/21      PT LONG TERM GOAL #2   Title Pt will go up and down 6 steps without increased pain    Baseline qoerking on steps    Time 6    Period Weeks    Status On-going    Target Date 11/01/20      PT LONG TERM GOAL #3   Title Patient will be independent with pool program and general strengthening program in order to continue  to progress mobility    Baseline working on her pool program    Time 6    Period Weeks    Status On-going    Target Date 11/01/20                   Plan - 04/26/21 1139     Clinical Impression Statement Despite baseline  soreness the patient was able to tolerate treamtent well. She reported the light exercises flet good with her legs, but she did fatigue a little with her DSLR. We switched to more upper body exercises and working on core activation and TA breathing. We will continue with her next few land visits to get her more used to the gym equipment.    Personal Factors and Comorbidities Fitness;Time since onset of injury/illness/exacerbation;Comorbidity 3+    Comorbidities multiple pain locations, back, hip, knee, Decreased mobility post covid and hospitalization last year,    Examination-Activity Limitations Stand;Locomotion Level;Bend;Dressing;Squat;Stairs    Examination-Participation Restrictions Cleaning;Meal Prep;Yard Work;Community Activity;Shop;Laundry    Stability/Clinical Decision Making Evolving/Moderate complexity    Clinical Decision Making Moderate    Rehab Potential Good    PT Frequency 2x / week    PT Duration 6 weeks    PT Treatment/Interventions ADLs/Self Care Home Management;Cryotherapy;Electrical Stimulation;Iontophoresis 39m/ml Dexamethasone;Ultrasound;DME Instruction;Gait training;Stair training;Therapeutic activities;Therapeutic exercise;Neuromuscular re-education;Patient/family education;Manual techniques;Passive range of motion;Dry needling;Taping    PT Next Visit Plan advance strengtheing and aerobic capacity    PT Home Exercise [facility] URL: [email or web address]  Date: 09/06/2020  Prepared by: DCarolyne Littles   Exercises  Seated Knee Extension with Resistance - 1 x daily - 7 x weekly - 3 sets - 10 reps  Seated March with Resistance - 1 x daily - 7 x weekly - 3 sets - 10 reps  Seated Hip Abduction with Resistance - 1 x daily - 7 x weekly - 3 sets - 10 reps    Consulted and Agree with Plan of Care Patient             Patient will benefit from skilled therapeutic intervention in order to improve the following deficits and impairments:  Abnormal gait,  Pain, Increased muscle spasms, Decreased activity tolerance, Decreased endurance, Decreased range of motion, Decreased strength, Impaired flexibility, Difficulty walking, Decreased balance, Decreased safety awareness, Decreased knowledge of precautions  Visit Diagnosis: Other abnormalities of gait and mobility  Unsteadiness on feet  Muscle weakness (generalized)  Pain in left hip  Stiffness of left hip, not elsewhere classified     Problem List Patient Active Problem List   Diagnosis Date Noted   Osteoarthritis, knee 09/24/2019   CKD (chronic kidney disease) stage 4, GFR 15-29 ml/min ([place] 09/24/2019   Severe recurrent major depression without psychotic features ([place] 09/09/2019   Bipolar I disorder, most recent episode depressed ([place]    Transaminitis    Essential hypertension    Encephalopathy 04/26/2019   Leukocytosis 04/21/2019   Thrombocytopenia ([place] 09/04/2018   Vitamin D deficiency 09/04/2018   Psoriatic arthritis ([place] 09/04/2018   Tubular adenoma of colon 05/28/2017   History of colonic polyps 05/28/2017   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 [place], [name], [address] Phone: [phone]   Fax:  [phone]  Name: Ashara Lounsbury MRN: [id number] Date of Birth: May 12, 1950

## 2021-04-28 IMAGING — DX DG CHEST 1V PORT
1 series · 1 of 1 positions shown · non-contrast
Comparison: None.

CLINICAL DATA: Altered mental status.  Fever.

EXAM:
PORTABLE CHEST 1 VIEW

[chest ap]
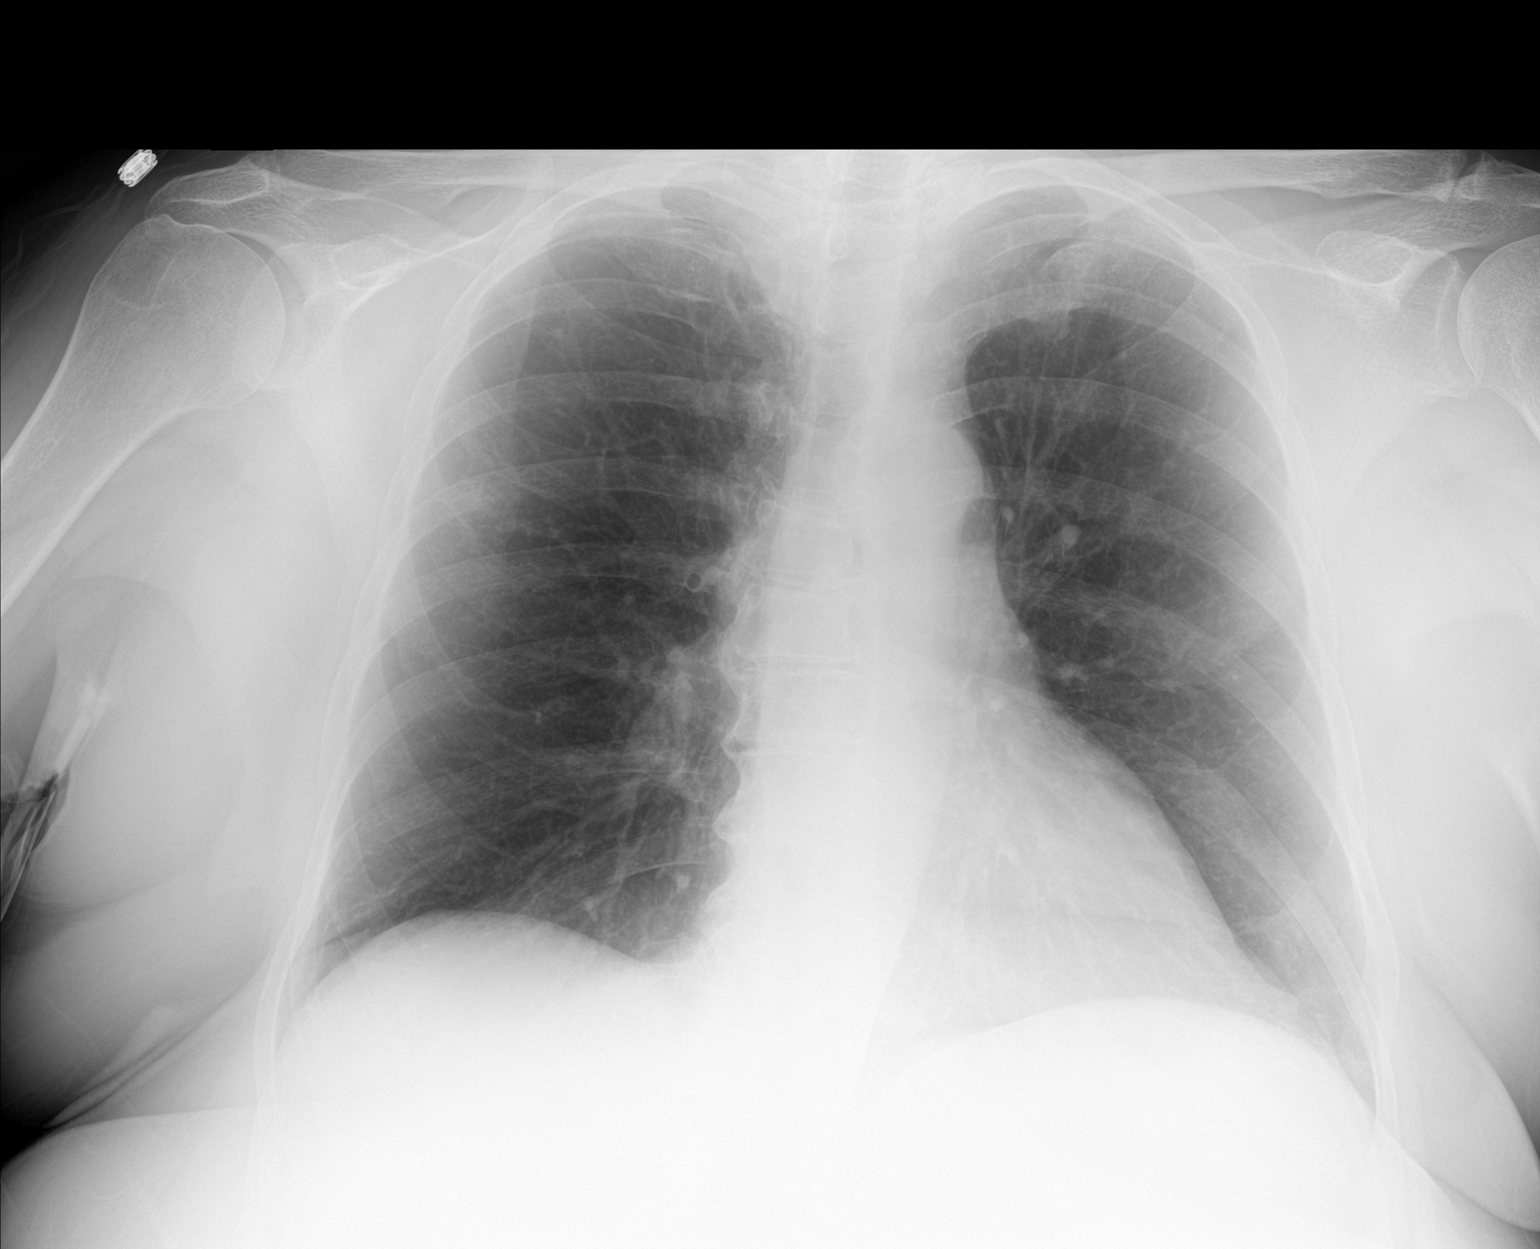

[1 of 1 positions shown; findings below may reference images not displayed]

FINDINGS: The lungs are clear. Heart size is normal. No pneumothorax or
pleural fluid. No acute or focal bony abnormality.
IMPRESSION: No acute disease.

## 2021-04-30 ENCOUNTER — Encounter: Payer: Self-pay | Admitting: Physician Assistant

## 2021-04-30 ENCOUNTER — Ambulatory Visit (HOSPITAL_BASED_OUTPATIENT_CLINIC_OR_DEPARTMENT_OTHER): Payer: Medicare Other | Admitting: Physical Therapy

## 2021-04-30 ENCOUNTER — Encounter (HOSPITAL_BASED_OUTPATIENT_CLINIC_OR_DEPARTMENT_OTHER): Payer: Self-pay

## 2021-04-30 NOTE — Telephone Encounter (Signed)
Please see message, urine looks like it is ordered future.

## 2021-05-02 ENCOUNTER — Ambulatory Visit (HOSPITAL_BASED_OUTPATIENT_CLINIC_OR_DEPARTMENT_OTHER): Payer: Medicare Other | Admitting: Physical Therapy

## 2021-05-02 ENCOUNTER — Other Ambulatory Visit: Payer: Self-pay

## 2021-05-02 DIAGNOSIS — R2689 Other abnormalities of gait and mobility: Secondary | ICD-10-CM

## 2021-05-02 DIAGNOSIS — R2681 Unsteadiness on feet: Secondary | ICD-10-CM

## 2021-05-02 DIAGNOSIS — M25552 Pain in left hip: Secondary | ICD-10-CM

## 2021-05-02 DIAGNOSIS — M25652 Stiffness of left hip, not elsewhere classified: Secondary | ICD-10-CM

## 2021-05-02 DIAGNOSIS — M6281 Muscle weakness (generalized): Secondary | ICD-10-CM

## 2021-05-03 ENCOUNTER — Encounter (HOSPITAL_BASED_OUTPATIENT_CLINIC_OR_DEPARTMENT_OTHER): Payer: Self-pay | Admitting: Physical Therapy

## 2021-05-03 NOTE — Therapy (Signed)
[name], [name], [address] Phone: [phone]   Fax:  [phone]  Physical Therapy Treatment  Patient Details  Name: Tiffany [name] MRN: [id number] Date of Birth: 1950-03-26 Referring Provider (PT): Inda Coke, [place]   Encounter Date: 05/02/2021   PT End of Session - 05/03/21 1121     Visit Number 21    Number of Visits 30    Date for PT Re-Evaluation 05/30/21    Authorization Type progress note performed on visit 18 Next on 28    PT Start Time 1145    PT Stop Time 1225    PT Time Calculation (min) 40 min    Activity Tolerance Patient tolerated treatment well    Behavior During Therapy [facility] for tasks assessed/performed             Past Medical History:  Diagnosis Date   Bipolar 1 disorder ([place])    Breast cancer ([place])    Chronic diarrhea    loose stools twice a day on average for years   Chronic kidney disease (CKD), stage III (moderate) ([place])    Depression 1987   Hospitalization or health care facility admission within last 6 months 04/2019   for fall/seizures   Malignant neoplasm of overlapping sites of left breast in female, estrogen receptor positive ([place]) 03/14/2016   Dx in 09/2014, s/p bilateral mastectomies and ALND, 0/10 LN. 1.4 cm  Grade I invasive lobular, ER and PR +/Her--, Ki 67 <5% Tried anastrozole for one month, but developed suicidal idea   Osteoarthritis, knee 09/24/2019   Xray 09/2019   Parkinson's disease ([place]) 2012   Psoriatic arthritis ([place])    PTSD (post-traumatic stress disorder)    Secondary hyperparathyroidism ([place])    Tardive dyskinesia     Past Surgical History:  Procedure Laterality Date   [place] Bilateral 09/19/2014   TONSILLECTOMY  1970   URETERAL REIMPLANTION Bilateral 1974    There were no vitals filed for this visit.   Subjective Assessment -  05/03/21 1118     Subjective The patient has had some pain down the back of her right leg over the past few days. She reports she feels it mostly at night.    Limitations Standing;Walking;House hold activities    How long can you sit comfortably? no issues    How long can you stand comfortably? Could be immediate or an hour. For sure after an hour.    How long can you walk comfortably? has to use a device for limited distances    Patient Stated Goals Getting stronger, improving balance, pain free    Currently in Pain? Yes    Pain Score 6     Pain Location Leg    Pain Orientation Right;Left    Pain Descriptors / Indicators Aching    Pain Type Chronic pain    Pain Onset More than a month ago    Pain Frequency Constant    Aggravating Factors  standing and walking    Pain Relieving Factors light stretching    Effect of Pain on Daily Activities pain while sleeping    Multiple Pain Sites No                 LTR: x20 each way   Manaul: Gentle Knee extension stretching    Knee: quad set 3x10  SAQ 3x10 bilateral  SLR 2x10 bilateral fatigued on the last set   supine march 2x10   Seated hip abduction 3x15; red     Bilateral er red 2x10  Horizontal abduction red 2x10  Shoulder flexion 1 lb 2x10   Hamstring stretch 3x20 sec hold                        PT Education - 05/03/21 1121     Education Details reviewed technique with ther-ex    Person(s) Educated Patient    Methods Explanation;Tactile cues;Demonstration;Verbal cues    Comprehension Verbalized understanding;Returned demonstration;Verbal cues required;Tactile cues required              PT Short Term Goals - 04/19/21 1508       PT SHORT TERM GOAL #1   Title Pt will improve tug with rollater below 14 secs to show improvement in ambulation.    Baseline 14 seconds 12/28    Time 3    Period Weeks    Status Achieved    Target Date 05/17/21      PT SHORT TERM GOAL #2   Title Patient  will improve strength for bilateral hip flexion to 5/5.    Baseline tested with hand dyno today improving in most plains    Time 3    Period Weeks    Status Partially Met    Target Date 05/17/21      PT SHORT TERM GOAL #3   Title Patient will performed 5x sit to stand below 10  secs to show improvement is functional strenght.    Baseline 11    Time 4    Period Weeks    Status On-going    Target Date 05/17/21      PT SHORT TERM GOAL #4   Title Pt will improve 5x sit<>stand to less than or equal to 18 seconds for improved functional strength.    Baseline 16.93 sec 06/30/2019    Time 4    Period Weeks    Status On-going      PT SHORT TERM GOAL #5   Title Pt/husband will verbalize understanding of fall prevention in home environment.    Time 4    Period Weeks    Status On-going    Target Date 05/17/21               PT Long Term Goals - 04/19/21 1509       PT LONG TERM GOAL #1   Title Pt will be be able to perform six minute walk test and ambulate 700 feet without a device or with a less restricted device such as a cane independently in order to ambualte in the community    Baseline qill test next visit    Time 8    Period Weeks    Status On-going    Target Date 06/14/21      PT LONG TERM GOAL #2   Title Pt will go up and down 6 steps without increased pain    Baseline qoerking on steps    Time 6    Period Weeks    Status On-going    Target Date 11/01/20      PT LONG TERM GOAL #3   Title Patient will be independent with pool program and general strengthening program in order to continue to progress mobility    Baseline working on her pool program    Time 6    Period Weeks  Status On-going    Target Date 11/01/20                   Plan - 05/03/21 1127     Clinical Impression Statement The patient is making good progress. She tolerated ther-ex well> She did not perfrom gym exercises today because she had increased pain down the back of her leg. Sh  ehad no increase in pain with treatment. She was shown a hamstring stretch for posterior leg stretching. Therapy will continue to progress as tolerated.    Personal Factors and Comorbidities Fitness;Time since onset of injury/illness/exacerbation;Comorbidity 3+    Comorbidities multiple pain locations, back, hip, knee, Decreased mobility post covid and hospitalization last year,    Examination-Activity Limitations Stand;Locomotion Level;Bend;Dressing;Squat;Stairs    Examination-Participation Restrictions Cleaning;Meal Prep;Yard Work;Community Activity;Shop;Laundry    Stability/Clinical Decision Making Evolving/Moderate complexity    Clinical Decision Making Moderate    Rehab Potential Good    PT Frequency 2x / week    PT Duration 6 weeks    PT Treatment/Interventions ADLs/Self Care Home Management;Cryotherapy;Electrical Stimulation;Iontophoresis 40m/ml Dexamethasone;Ultrasound;DME Instruction;Gait training;Stair training;Therapeutic activities;Therapeutic exercise;Neuromuscular re-education;Patient/family education;Manual techniques;Passive range of motion;Dry needling;Taping    PT Next Visit Plan advance strengtheing and aerobic capacity    PT Home Exercise Plan [facility]: [email or web address] URL: [email or web address]  Date: 09/06/2020  Prepared by: DCarolyne Littles   Exercises  Seated Knee Extension with Resistance - 1 x daily - 7 x weekly - 3 sets - 10 reps  Seated March with Resistance - 1 x daily - 7 x weekly - 3 sets - 10 reps  Seated Hip Abduction with Resistance - 1 x daily - 7 x weekly - 3 sets - 10 reps    Consulted and Agree with Plan of Care Patient             Patient will benefit from skilled therapeutic intervention in order to improve the following deficits and impairments:  Abnormal gait, Pain, Increased muscle spasms, Decreased activity tolerance, Decreased endurance, Decreased range of motion, Decreased strength, Impaired flexibility, Difficulty walking, Decreased  balance, Decreased safety awareness, Decreased knowledge of precautions  Visit Diagnosis: Other abnormalities of gait and mobility  Unsteadiness on feet  Muscle weakness (generalized)  Pain in left hip  Stiffness of left hip, not elsewhere classified     Problem List Patient Active Problem List   Diagnosis Date Noted   Osteoarthritis, knee 09/24/2019   CKD (chronic kidney disease) stage 4, GFR 15-29 ml/min ([place] 09/24/2019   Severe recurrent major depression without psychotic features ([place] 09/09/2019   Bipolar I disorder, most recent episode depressed ([place]    Transaminitis    Essential hypertension    Encephalopathy 04/26/2019   Leukocytosis 04/21/2019   Thrombocytopenia ([place] 09/04/2018   Vitamin D deficiency 09/04/2018   Psoriatic arthritis ([place] 09/04/2018   Tubular adenoma of colon 05/28/2017   History of colonic polyps 05/28/2017   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 09/12/2016   Malignant neoplasm of overlapping sites of left breast in female, estrogen receptor positive ([place] 03/14/2016   Chronic kidney disease, stage III (moderate) 01/19/2016   Tardive dyskinesia 10/18/2015   History of breast cancer left 2016 12/27/2014   Osteopenia determined by x-ray 10/31/2014   Rosacea 05/21/2007   Bipolar affective disorder, mixed ([place] 08/16/2004   Acquired hypothyroidism 04/03/1996    DCarney Living PT 05/03/2021, 11:30 AM  [place] [place] [address]Phone: [phone]  Fax:  [phone]  Name: Tiffany [name] MRN: [id number] Date of Birth: 1950-08-03

## 2021-05-04 ENCOUNTER — Ambulatory Visit
Admission: RE | Admit: 2021-05-04 | Discharge: 2021-05-04 | Disposition: A | Discharge status: Home / Self Care | Payer: Medicare Other | Source: Ambulatory Visit | Attending: Physician Assistant | Admitting: Physician Assistant

## 2021-05-04 ENCOUNTER — Other Ambulatory Visit: Payer: Self-pay

## 2021-05-04 ENCOUNTER — Other Ambulatory Visit: Payer: Self-pay | Admitting: Physician Assistant

## 2021-05-04 DIAGNOSIS — N644 Mastodynia: Secondary | ICD-10-CM

## 2021-05-07 ENCOUNTER — Encounter (HOSPITAL_BASED_OUTPATIENT_CLINIC_OR_DEPARTMENT_OTHER): Payer: Self-pay

## 2021-05-07 ENCOUNTER — Encounter: Payer: Self-pay | Admitting: Gastroenterology

## 2021-05-07 ENCOUNTER — Ambulatory Visit (HOSPITAL_BASED_OUTPATIENT_CLINIC_OR_DEPARTMENT_OTHER): Payer: Medicare Other | Admitting: Physical Therapy

## 2021-05-07 ENCOUNTER — Ambulatory Visit (INDEPENDENT_AMBULATORY_CARE_PROVIDER_SITE_OTHER): Payer: Medicare Other | Admitting: Gastroenterology

## 2021-05-07 VITALS — BP 106/72 | HR 54 | Ht 64.0 in | Wt 203.0 lb

## 2021-05-07 DIAGNOSIS — Z86010 Personal history of colonic polyps: Secondary | ICD-10-CM

## 2021-05-07 DIAGNOSIS — R197 Diarrhea, unspecified: Secondary | ICD-10-CM | POA: Diagnosis not present

## 2021-05-07 IMAGING — CT CT HEAD W/O CM
3 of 4 series · 16 of 47 positions shown, 19 images · non-contrast
Comparison: None.

CLINICAL DATA: Altered mental status and 3913C-H4 positivity

EXAM:
CT HEAD WITHOUT CONTRAST
TECHNIQUE: Contiguous axial images were obtained from the base of the skull
through the vertex without intravenous contrast.

[Series 3: head 2.0 h70h · axial · 0.40mm/px · z∈[-137,+13]mm · 10 of 89 slices shown, 13 images]
[im 9/89  brain]
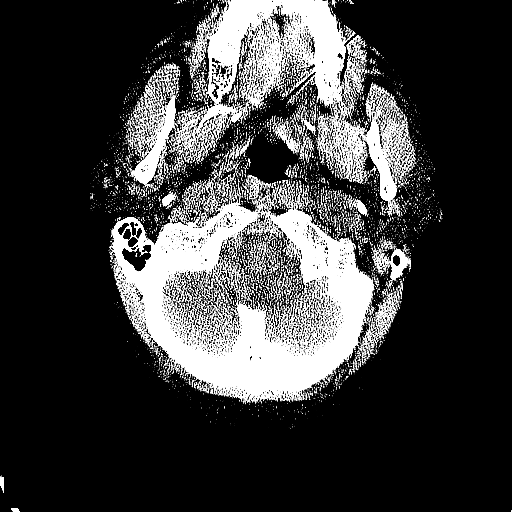
[im 9/89  bone]
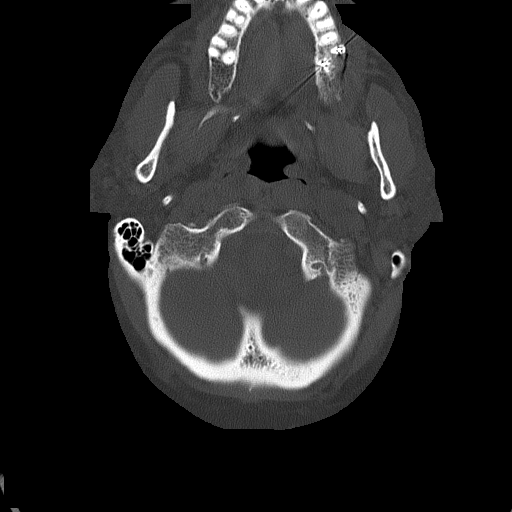
[im 17/89  brain]
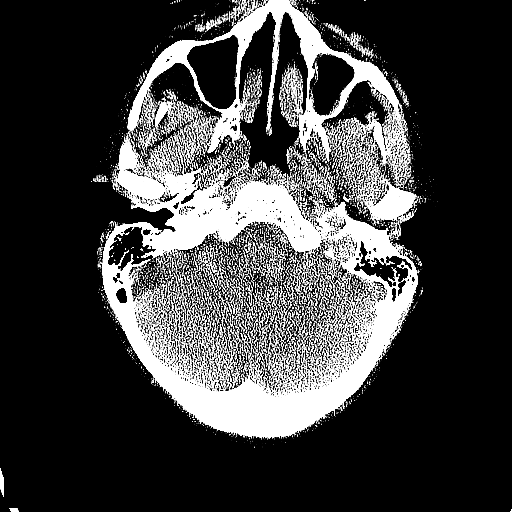
[im 26/89  brain]
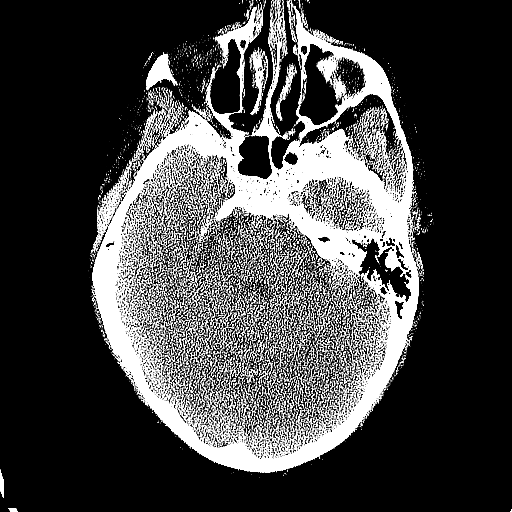
[im 34/89  brain]
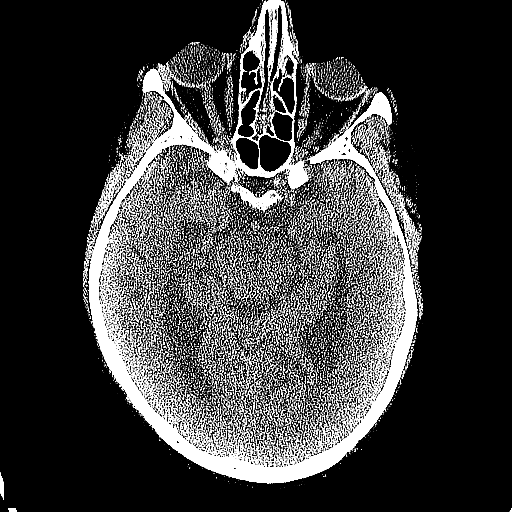
[im 42/89  brain]
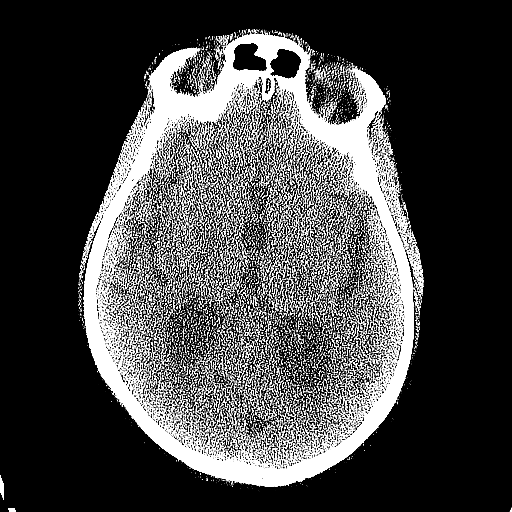
[im 42/89  bone]
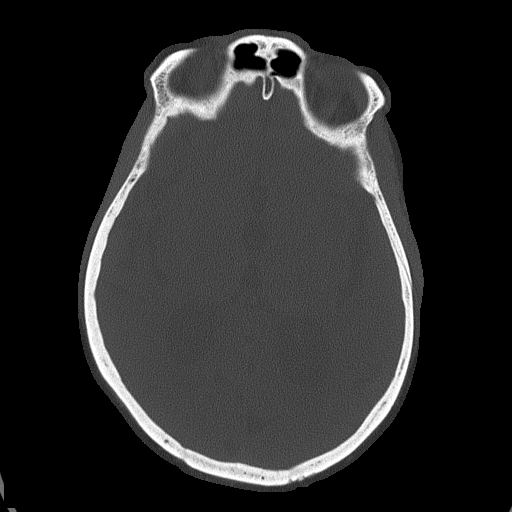
[im 51/89  brain]
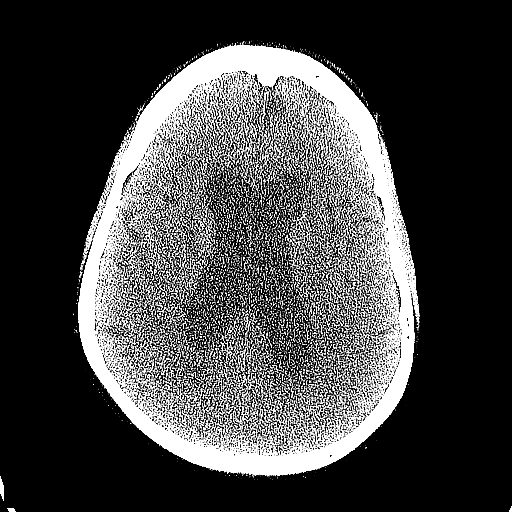
[im 59/89  brain]
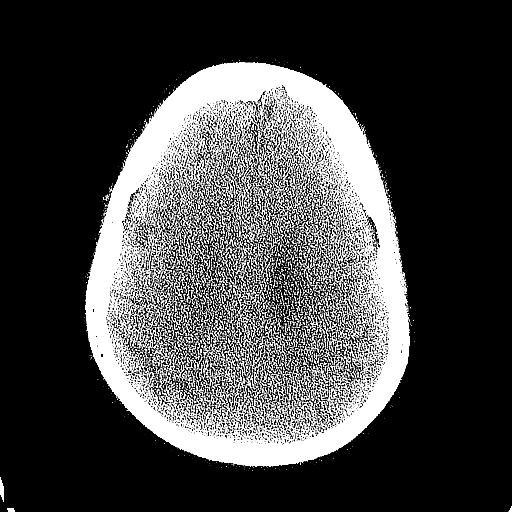
[im 68/89  brain]
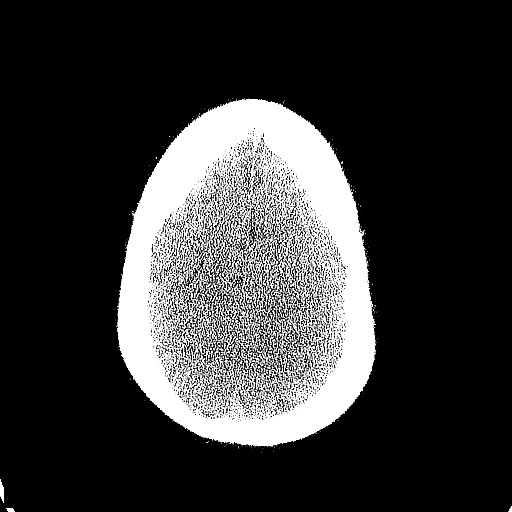
[im 76/89  brain]
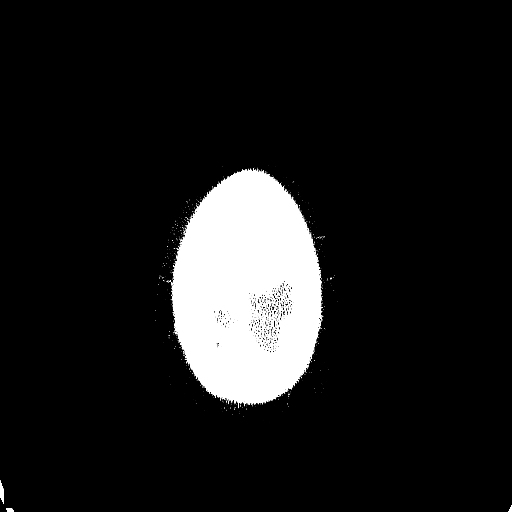
[im 76/89  bone]
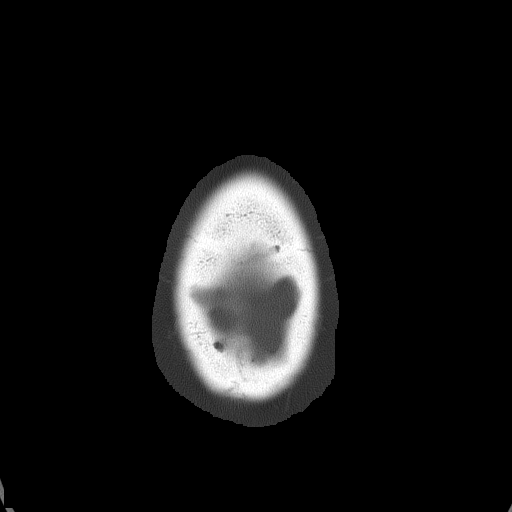
[im 84/89  brain]
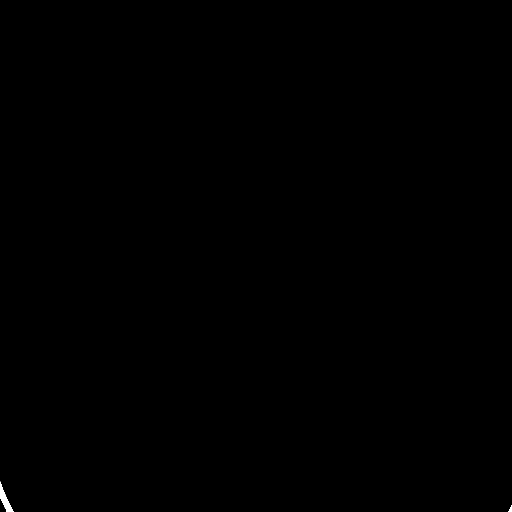

[Series 5: head 3.0 mpr cor · coronal · 0.29mm/px · 3 of 66 slices shown]
[im 22/66  brain]
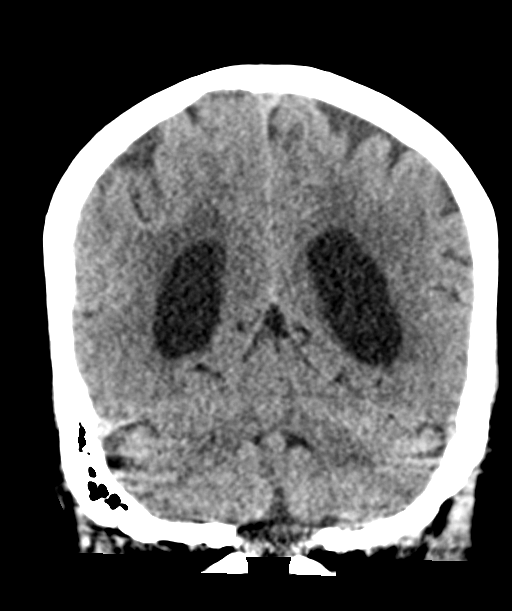
[im 29/66  brain]
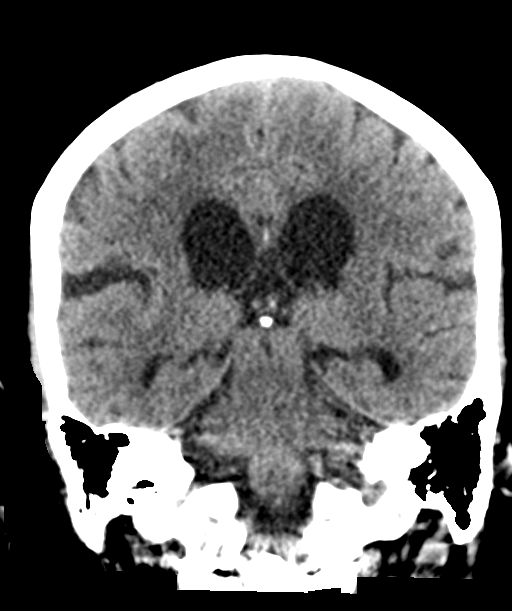
[im 37/66  brain]
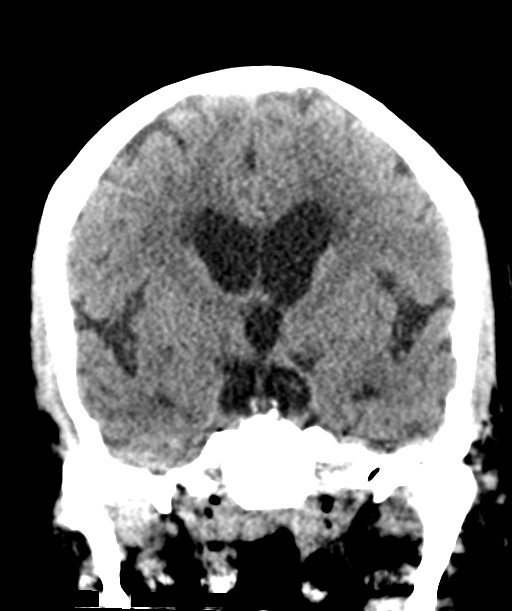

[Series 6: head 3.0 mpr sag · sagittal · 0.35mm/px · 3 of 53 slices shown]
[im 18/53  brain]
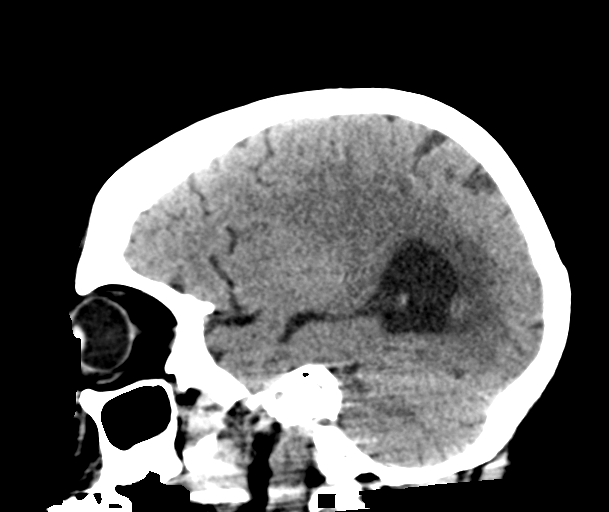
[im 27/53  brain]
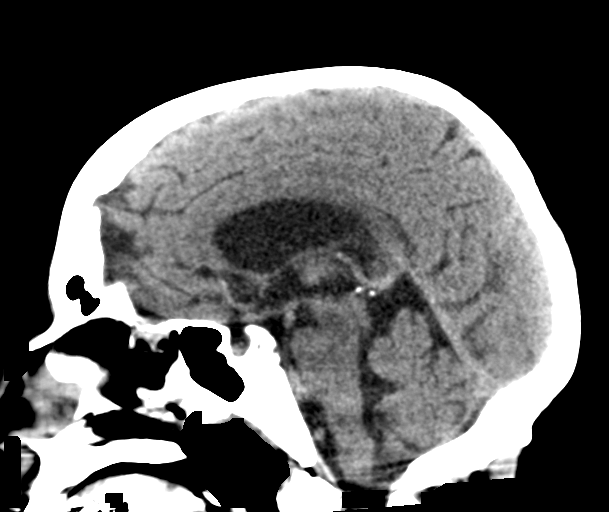
[im 35/53  brain]
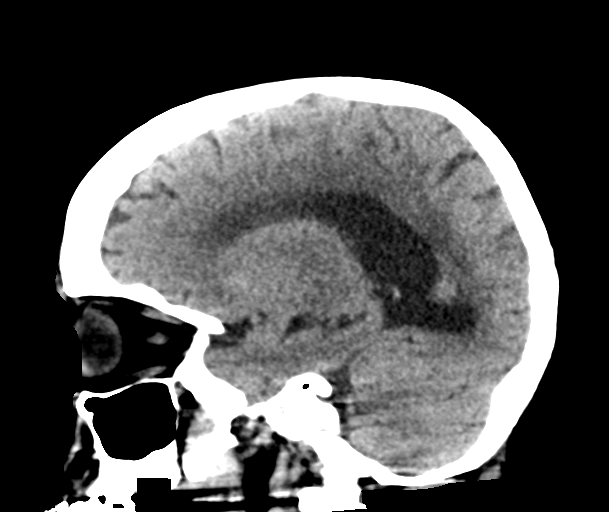

[16 of 47 positions shown; findings below may reference images not displayed]

FINDINGS: Brain: Mild prominence of the ventricles is noted which may be
related to mild atrophy. No findings to suggest acute hemorrhage,
acute infarction or space-occupying mass lesion are noted.

Vascular: No hyperdense vessel or unexpected calcification.

Skull: Normal. Negative for fracture or focal lesion.

Sinuses/Orbits: No acute finding.

Other: None.
IMPRESSION: Mild atrophy with ventricular prominence. No other focal abnormality
is noted.

## 2021-05-07 NOTE — Patient Instructions (Addendum)
It was my pleasure to provide care to you today. Based on our discussion, I am providing you with my recommendations below:  RECOMMENDATION(S):   Your daughter should schedule her colonoscopy now!  I have arranged for genetic counseling given the number of polyps that were removed on your colonoscopy in 2019.   PROCEDURE:  I am recommending that you have a(n) colonoscopy completed. Per your request, my staff did not schedule the procedure(s) today. When you are ready to proceed   BMI:  If you are age 73 or older, your body mass index should be between 23-30. Your Body mass index is 34.84 kg/m. If this is out of the aforementioned range listed, please consider follow up with your Primary Care Provider.  MY CHART:  The [place] [facility] providers would like to encourage you to use [facility] to communicate with providers for non-urgent requests or questions.  Due to long hold times on the telephone, sending your provider a message by [facility] may be a faster and more efficient way to get a response.  Please allow 48 business hours for a response.  Please remember that this is for non-urgent requests.   Thank you for trusting me with your gastrointestinal care!    Thornton Park, MD, MPH

## 2021-05-07 NOTE — Progress Notes (Signed)
Referring Provider: Inda Coke, PA Primary Care Physician:  Inda Coke, PA  Chief complaint: History of colon polyps, history of diarrhea  IMPRESSION:  History of colon polyps    - 17 tubular adenomas removed on colonoscopy 05/21/17    - surveillance recommended in 2020 Chronic diarrhea since childhood    - normal colon biopsies 2019    - celiac testing, stool studies for fat, pancreatic elastase, giardia, culture, and O&P in 2018    - recently improved  Left-sided diverticulosis Lactose intolerance Tortuous sigmoid colon on prior colonoscopy History of intolerance to conscious sedation in 2004 History of difficulty with nausea related to bowel prep in 2004 Family history of colon polyps (sister) Cholecystectomy for cholelithiasis  History of colon polyps: She is due a colonoscopy for surveillance given the 17 tubular adenomas removed in 2019. She has reservations about the colonoscopy and feels that she is willing to assume the risk of colon cancer rather than have another colonoscopy. We discussed the procedure in length including preparation, anesthesia, and anticipated recovery.  I encouraged her now 89 year old daughter to start colon cancer screening. Recommended genetic counseling.   PLAN: - Referral to genetic counseling - Her 57 year old daughter should have a colonoscopy - Colonoscopy when she would like to proceed, would use PlenVu at that time as that was successful in 2019   I consented the patient discussing the risks, benefits, and alternatives to endoscopic evaluation. In particular, we discussed the risks that include, but are not limited to, reaction to medication, cardiopulmonary compromise, bleeding requiring blood transfusion, aspiration resulting in pneumonia, perforation requiring surgery, lack of diagnosis, severe illness requiring hospitalization, and even death. We reviewed the risk of missed lesion including polyps or even cancer. The patient  acknowledges these risks and asks that we proceed.  HPI: ANNALEI FRIESZ is a 71 y.o. female initially referred by Dr. Jonni Sanger in 2020 for diarrhea.  She was seen as a virtual visit 10/02/2018 and had an inperson visit 05/07/19.  Endoscopy was recommended but has not yet been performed.  The interval history is obtained through the patient, review of her electronic health record including [place], and her husband who accompanies her to this appointment and provides the bulk of her history. She has psoriatic arthritis, acquired hypothyroidism, history of breast cancer in 2016 status post bilateral mastectomies, vitamin D deficiency, CKD III, bipolar disorder on lithium, COVID-19 in January, parkinsonism due to Abilify, and PTSD on prazosin. She had a cholecystectomy for symptomatic cholelithiasis 40 years ago. She had a hospitalization for complications attributed to Covid in 2011 requiring Neurology rehab therapy. She also has a history of colon polyps.  At the time of her initial consultation she reported chronic diarrhea since childhood, predating cholecystectomy. Diarrhea described as strong urgency with poorly formed stools. May have no bowel movement for 3-4 days or 3 BM a daily. Used Lomotil as a child.  Uses Imodium as needed as an adult. Stool has been an orange color over the last year. No blood or mucous. No abdominal pain.  No identified triggers except for lactose intolerant with dairy causing diarrhea. No other associated symptoms. No identified exacerbating or relieving features.   Reports normal colonoscopy in 2006, although she was uncomfortable during the exam.   Through CareEverywhere I am able to see that she saw a gastroenterologist in 2018 and 2019 in [place] for a change in bowel habits. At that time she developed constipation relieved with MiraLAX despite normal  bowel habits more like diarrhea. She had celiac testing, stool studies for fat, pancreatic elastase, giardia, culture, and  O&P. The results of the tests are not available in [place]. Occult blood immunoassay stool test was positive x 2.  He recommended a trial of lactose free diet and low FODMAP diet. He recommended fiber supplements for stool bulk. Cholestyramine was considered. Colonoscopy was recommended but she was unable to complete the prep due to nausea.   Ultimately had a colonoscopy 05/21/17 at [facility] with Dr. Luci Bank that revealed 17 tubular adenomas (2 transverse, 2 cecal, 5 ascending colon, 4 descending colon, 1 sigmoid, 1 rectum), severe left sided diverticulosis, tortuous sigmoid colon.  Random colon biopsies were negative at that time.  Surveillance colonoscopy recommended in 1 year. She remembers having a hard time with the entire procedure.   Returns today 2 years after her last appointment. She isn't sure why she's here. She isn't eager to pursue colonoscopy due to the necessary prep, her anxiety, and feeling like she may be too old to have a colonoscopy. She is willing to take the risk of having colon cancer if that means she can avoid colonoscopy. Inda Coke told her to come in for her colonoscopy.  Her diarrhea has largely resolved since her medical regimen has been minimized. Her husband feels that she was over-medicated and that this may have contributed to her diarrhea. She now primarily has diarrhea primarily related to anxiety.  Symptoms are not frequent enough to require daily treatment or preventation. No blood or mucous in the stool. No new GI complaints or concerns.   Past Medical History:  Diagnosis Date   Bipolar 1 disorder ([place])    Breast cancer ([place])    Chronic diarrhea    loose stools twice a day on average for years   Chronic kidney disease (CKD), stage III (moderate) ([place])    Depression 1987   Hospitalization or health care facility admission within last 6 months 04/2019   for fall/seizures   Malignant neoplasm of overlapping sites of left breast in  female, estrogen receptor positive ([place]) 03/14/2016   Dx in 09/2014, s/p bilateral mastectomies and ALND, 0/10 LN. 1.4 cm  Grade I invasive lobular, ER and PR +/Her--, Ki 67 <5% Tried anastrozole for one month, but developed suicidal idea   Osteoarthritis, knee 09/24/2019   Xray 09/2019   Parkinson's disease ([place]) 2012   Psoriatic arthritis ([place])    PTSD (post-traumatic stress disorder)    Secondary hyperparathyroidism ([place])    Tardive dyskinesia     Past Surgical History:  Procedure Laterality Date   [place]   MASTECTOMY Bilateral 09/19/2014   TONSILLECTOMY  1970   URETERAL REIMPLANTION Bilateral 1974    Current Outpatient Medications  Medication Sig Dispense Refill   Certolizumab Pegol (CIMZIA) 2 X 200 MG KIT See admin instructions.     cholecalciferol (VITAMIN D3) 25 MCG (1000 UNIT) tablet Take 1,000 Units by mouth daily.     lamoTRIgine (LAMICTAL) 200 MG tablet Take 1 tablet (200 mg total) by mouth at bedtime. 90 tablet 3   levothyroxine (SYNTHROID) 112 MCG tablet Take 1 tablet (112 mcg total) by mouth daily before breakfast. 90 tablet 0   lithium carbonate 300 MG capsule TAKE 1 CAPSULE(300 MG) BY MOUTH AT BEDTIME 90 capsule 3   LORazepam (ATIVAN) 1 MG tablet TAKE 1 TABLET(1 MG) BY MOUTH TWICE DAILY  AS NEEDED 60 tablet 2   metroNIDAZOLE (METROGEL) 0.75 % gel Apply to face 1-2 times daily 45 g 0   prazosin (MINIPRESS) 1 MG capsule Take one capsule at bedtime for two weeks and then discontinue. 14 capsule 0   sodium bicarbonate 650 MG tablet Take 650 mg by mouth daily.     No current facility-administered medications for this visit.    Allergies as of 05/07/2021 - Review Complete 05/07/2021  Allergen Reaction Noted   Aripiprazole Other (See Comments) 03/12/2017   Lactose intolerance (gi) Diarrhea 04/26/2019   Methotrexate Other (See Comments) 09/25/2017   Cefdinir Diarrhea 06/19/2017    Etanercept Other (See Comments) 11/24/2017   Exemestane Other (See Comments) 04/03/2016   Fluoxetine Other (See Comments) 03/12/2017   Lactose  07/31/2020   Methylprednisolone sodium succ Other (See Comments) 10/05/2014   Epinephrine Palpitations 10/05/2014   Nitrofurantoin Nausea And Vomiting and Rash 08/24/2010    Family History  Problem Relation Age of Onset   Cancer Mother        lymphoma   Cancer Sister        breast   Cancer Sister        lung, smoker in past   Stroke Maternal Grandfather    Diabetes Paternal Grandfather       Physical Exam: General: Awake, alert, and oriented, and well communicative. In no acute distress.  HEENT: EOMI, non-icteric sclera, NCAT, MMM  Neck: Normal movement of head and neck  Pulm: No labored breathing, speaking in full sentences without conversational dyspnea  Derm: No apparent lesions or bruising in visible field  MS: Moves all visible extremities without noticeable abnormality  Psych: Pleasant, cooperative, normal speech, normal affect and normal insight Neuro: Alert and appropriate  I spent over 30 minutes, including in depth chart review, independent review of results, communicating results with the patient directly, face-to-face time with the patient, coordinating care, and ordering studies and medications as appropriate, and documentation.   Novella Abraha L. Tarri Glenn, MD, MPH [facility] 05/07/2021, 3:27 PM

## 2021-05-07 NOTE — Therapy (Incomplete)
[place], [name], [address] Phone: [phone]   Fax:  [phone]  Physical Therapy Treatment/Progress Note   Patient Details  Name: Tiffany [name] MRN: [id number] Date of Birth: 1950-06-25 Referring Provider (PT): Inda Coke, [place]    Encounter Date: 05/07/2021     Past Medical History:  Diagnosis Date   Bipolar 1 disorder ([place])    Breast cancer ([place])    Chronic diarrhea    loose stools twice a day on average for years   Chronic kidney disease (CKD), stage III (moderate) ([place])    Depression 1987   Hospitalization or health care facility admission within last 6 months 04/2019   for fall/seizures   Malignant neoplasm of overlapping sites of left breast in female, estrogen receptor positive ([place]) 03/14/2016   Dx in 09/2014, s/p bilateral mastectomies and ALND, 0/10 LN. 1.4 cm  Grade I invasive lobular, ER and PR +/Her--, Ki 67 <5% Tried anastrozole for one month, but developed suicidal idea   Osteoarthritis, knee 09/24/2019   Xray 09/2019   Parkinson's disease ([place]) 2012   Psoriatic arthritis ([place])    PTSD (post-traumatic stress disorder)    Secondary hyperparathyroidism ([place])    Tardive dyskinesia     Past Surgical History:  Procedure Laterality Date   [place] Bilateral 09/19/2014   TONSILLECTOMY  1970   URETERAL REIMPLANTION Bilateral 1974    There were no vitals filed for this visit.     Pt seen for aquatic therapy today.  Treatment took place in water 3.25-4.8 ft in depth at the [facility] pool. Temp of water was 93.  Pt entered/exited the pool via stairs  combination ofstep to/through pattern independently with bilat rail.   Walking for warm up back and forth by bench to improve confidence in ability.  Pt amb fwd, back and side stepping with distance sup.  Cues for increased  speed for  increased resistance Reviewed current function, pain levels, response to prior Rx, and HEP compliance.    Seated 4th water step: stretching hamstrings and gastroc Flutter kicking 3 x 25 reps Add/abd 3x20  STS 4th  water step x 10.  Pt rising gaining immediate standing balance indep 8/10. (Improvement from 4/10 last visit)  Balance challenges holding hand buoys: Tandem stance R/L with then without ue support able to hold after several tries x 15 seconds SLS with ue support R/L several tries x 12 seconds best.  Without ue support best x 5  Standing -step ups forward R/L x12, backward x 10, side stepping to right x 10. Cues for abdominal bracing and weight shifting. Progressed from ue support to no support.  Stair climbing using alternating pattern.                                          PT Short Term Goals - 04/19/21 1508       PT SHORT TERM GOAL #1   Title Pt will improve tug with rollater below 14 secs to show improvement in ambulation.    Baseline 14 seconds 12/28    Time 3    Period Weeks    Status Achieved    Target Date 05/17/21      PT SHORT TERM GOAL #2  Title Patient will improve strength for bilateral hip flexion to 5/5.    Baseline tested with hand dyno today improving in most plains    Time 3    Period Weeks    Status Partially Met    Target Date 05/17/21      PT SHORT TERM GOAL #3   Title Patient will performed 5x sit to stand below 10  secs to show improvement is functional strenght.    Baseline 11    Time 4    Period Weeks    Status On-going    Target Date 05/17/21      PT SHORT TERM GOAL #4   Title Pt will improve 5x sit<>stand to less than or equal to 18 seconds for improved functional strength.    Baseline 16.93 sec 06/30/2019    Time 4    Period Weeks    Status On-going      PT SHORT TERM GOAL #5   Title Pt/husband will verbalize understanding of fall prevention in home environment.    Time 4     Period Weeks    Status On-going    Target Date 05/17/21               PT Long Term Goals - 04/19/21 1509       PT LONG TERM GOAL #1   Title Pt will be be able to perform six minute walk test and ambulate 700 feet without a device or with a less restricted device such as a cane independently in order to ambualte in the community    Baseline qill test next visit    Time 8    Period Weeks    Status On-going    Target Date 06/14/21      PT LONG TERM GOAL #2   Title Pt will go up and down 6 steps without increased pain    Baseline qoerking on steps    Time 6    Period Weeks    Status On-going    Target Date 11/01/20      PT LONG TERM GOAL #3   Title Patient will be independent with pool program and general strengthening program in order to continue to progress mobility    Baseline working on her pool program    Time 6    Period Weeks    Status On-going    Target Date 11/01/20                     Patient will benefit from skilled therapeutic intervention in order to improve the following deficits and impairments:     Visit Diagnosis: No diagnosis found.     Problem List Patient Active Problem List   Diagnosis Date Noted   Osteoarthritis, knee 09/24/2019   CKD (chronic kidney disease) stage 4, GFR 15-29 ml/min (HCC) 09/24/2019   Severe recurrent major depression without psychotic features ([place]) 09/09/2019   Bipolar I disorder, most recent episode depressed ([place])    Transaminitis    Essential hypertension    Encephalopathy 04/26/2019   Leukocytosis 04/21/2019   Thrombocytopenia ([place]) 09/04/2018   Vitamin D deficiency 09/04/2018   Psoriatic arthritis ([place]) 09/04/2018   Tubular adenoma of colon 05/28/2017   History of colonic polyps 05/28/2017   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 09/12/2016   Malignant neoplasm of overlapping sites of left breast in female, estrogen receptor positive ([place]) 03/14/2016   Chronic kidney  disease, stage III (moderate) 01/19/2016  Tardive dyskinesia 10/18/2015   History of breast cancer left 2016 12/27/2014   Osteopenia determined by x-ray 10/31/2014   Rosacea 05/21/2007   Bipolar affective disorder, mixed ([place]) 08/16/2004   Acquired hypothyroidism 04/03/1996    Annamarie Major) [place] MPT 05/07/2021, 7:32 AM  [facility] [address] [place], [name], [address] Phone: [phone]   Fax:  [phone]  Name: Tiffany [name] MRN: [id number] Date of Birth: 1950/12/25

## 2021-05-08 ENCOUNTER — Ambulatory Visit (INDEPENDENT_AMBULATORY_CARE_PROVIDER_SITE_OTHER): Payer: Medicare Other | Admitting: Psychiatry

## 2021-05-08 ENCOUNTER — Other Ambulatory Visit: Payer: Self-pay

## 2021-05-08 DIAGNOSIS — F4310 Post-traumatic stress disorder, unspecified: Secondary | ICD-10-CM

## 2021-05-08 DIAGNOSIS — F3132 Bipolar disorder, current episode depressed, moderate: Secondary | ICD-10-CM | POA: Diagnosis not present

## 2021-05-08 DIAGNOSIS — Z630 Problems in relationship with spouse or partner: Secondary | ICD-10-CM | POA: Diagnosis not present

## 2021-05-08 DIAGNOSIS — F4010 Social phobia, unspecified: Secondary | ICD-10-CM

## 2021-05-08 DIAGNOSIS — F411 Generalized anxiety disorder: Secondary | ICD-10-CM

## 2021-05-08 NOTE — Progress Notes (Unsigned)
Psychotherapy Progress Note [facility] [name], PhD LP  Patient ID: Tiffany [name])    MRN: [id number] Therapy format: Individual psychotherapy Date: 05/08/2021      Start: 11:15a     Stop: ***:***     Time Spent: *** min Location: In-person   Session narrative (presenting needs, interim history, self-report of stressors and symptoms, applications of prior therapy, status changes, and interventions made in session) "There's a lot of stuff."  Not  feeling so well lately, more thoughts of death.  Libido surge of 2 weeks ago went away, but she's been trying to align bedtimes with Tiffany [name], who usually goes to bed 8pm or so.  He falls asleep very quickly, making for a double message about him desiring her.  Next night he had 3 hours more energy, but he directed it more to household errands.  Over a few nights conspired to make her feel abandoned, and 3am the one night threw off he covers, screamed, and went in the next room for the night.  Next day had a lunch date that was more interactive and engaging, took the opportunity to explain how she was getting frustrated in the evenings, Tiffany [name] reacted so favorably they raced home and had sex.  Started reromanticizing via things like showering together.  Attributes depressive wave now to times of feeling disregarded, but recognizes sometimes Tiffany [name]'s tinnitus just gets in the way of hearing her.  Another issue with how he treats Tiffany [name], seemingly prioritizing her to the sometime neglect of Tiffany [name].  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  Obvious concern for Tiffany [name]'s wellbeing and future, but has reconciled having less privacy.    Been thinking about the idea that she has sudden neural firing, enough to have suden strong feelings, noticing more times and some  triggers.  Noticed at church she may be uncharacteristically effusive, then feel embarrasssed and even say aloud "Oh, no".  Socializing itself seems to be a trigger -- used to be more introverted, and possibly has a long history of telling herself she "should" be more social.  Felt she babbled with the pastor recently.  Ackowledged self-consciousness, conditioned from negative attention past, and discussed social anxiety reasons for gushing (control her own sense of exposure, offset past criticism), and seeing herself seem a bit more out of control that way has been   Meanwhile, had ultrasound, radiologist reviewed personally, found nothing suspicious, but she was disappointed not to have something fatal.  Colonoscopy c. 3 yrs ago, with 17 precancerous polyps taken, knows she's late for repeat colonoscopy.  Can   Does acknowledge feeling overbooked with the grandchildren   Therapeutic modalities: {AM:23362::"Cognitive Behavioral Therapy","Solution-Oriented/Positive Psychology"}  Mental Status/Observations:  Appearance:   {PSY:22683}     Behavior:  {PSY:21022743}  Motor:  {PSY:22302}  Speech/Language:   {PSY:22685}  Affect:  {PSY:22687}  Mood:  {PSY:31886}  Thought process:  {PSY:31888}  Thought content:    {PSY:669-348-4694}  Sensory/Perceptual disturbances:    {PSY:414-265-2319}  Orientation:  {Psych Orientation:23301::"Fully oriented"}  Attention:  {Good-Fair-Poor ratings:23770::"Good"}    Concentration:  {Good-Fair-Poor ratings:23770::"Good"}  Memory:  {PSY:940-210-9433}  Insight:    {Good-Fair-Poor ratings:23770::"Good"}  Judgment:   {Good-Fair-Poor ratings:23770::"Good"}  Impulse Control:  {Good-Fair-Poor ratings:23770::"Good"}   Risk Assessment: Danger to Self: {Risk:22599::"No"} Self-injurious Behavior: {Risk:22599::"No"} Danger to Others: {Risk:22599::"No"} Physical Aggression /  Violence: {Risk:22599::"No"} Duty to Warn: {AMYesNo:22526::"No"} Access to Firearms a concern:  {AMYesNo:22526::"No"}  Assessment of progress:  {Progress:22147::"progressing"}  Diagnosis: No diagnosis found. Plan:  *** Other recommendations/advice as may be noted above Continue to utilize previously learned skills ad lib Maintain medication as prescribed and work faithfully with relevant prescriber(s) if any changes are desired or seem indicated Call the clinic on-call service, 988/hotline, 911, or present to [facility] or ER if any life-threatening psychiatric crisis No follow-ups on file. Already scheduled visit in this office 05/25/2021.  [name], PhD [name], PhD LP Clinical Psychologist, [facility] [address], [place], [place] [address] [phone]

## 2021-05-09 ENCOUNTER — Encounter (HOSPITAL_BASED_OUTPATIENT_CLINIC_OR_DEPARTMENT_OTHER): Payer: Self-pay | Admitting: Physical Therapy

## 2021-05-09 ENCOUNTER — Ambulatory Visit (HOSPITAL_BASED_OUTPATIENT_CLINIC_OR_DEPARTMENT_OTHER): Payer: Medicare Other | Attending: Physician Assistant | Admitting: Physical Therapy

## 2021-05-09 ENCOUNTER — Telehealth: Payer: Self-pay | Admitting: Genetic Counselor

## 2021-05-09 DIAGNOSIS — M25552 Pain in left hip: Secondary | ICD-10-CM | POA: Diagnosis present

## 2021-05-09 DIAGNOSIS — M6281 Muscle weakness (generalized): Secondary | ICD-10-CM | POA: Insufficient documentation

## 2021-05-09 DIAGNOSIS — R2681 Unsteadiness on feet: Secondary | ICD-10-CM | POA: Diagnosis present

## 2021-05-09 DIAGNOSIS — M25652 Stiffness of left hip, not elsewhere classified: Secondary | ICD-10-CM | POA: Insufficient documentation

## 2021-05-09 DIAGNOSIS — R2689 Other abnormalities of gait and mobility: Secondary | ICD-10-CM | POA: Diagnosis not present

## 2021-05-09 NOTE — Therapy (Signed)
[place], [name], [address] Phone: [phone]   Fax:  [phone]  Physical Therapy Treatment  Patient Details  Name: Tiffany [name] MRN: [id number] Date of Birth: December 03, 1950 Referring Provider (PT): Inda Coke, [place]   Encounter Date: 05/09/2021   PT End of Session - 05/09/21 1546     Visit Number 22    Number of Visits 30    Date for PT Re-Evaluation 05/30/21    Authorization Type progress note performed on visit 18 Next on 28    PT Start Time 1145    PT Stop Time 1228    PT Time Calculation (min) 43 min    Activity Tolerance Patient tolerated treatment well    Behavior During Therapy [facility] for tasks assessed/performed             Past Medical History:  Diagnosis Date   Bipolar 1 disorder ([place])    Breast cancer ([place])    Chronic diarrhea    loose stools twice a day on average for years   Chronic kidney disease (CKD), stage III (moderate) ([place])    Depression 1987   Hospitalization or health care facility admission within last 6 months 04/2019   for fall/seizures   Malignant neoplasm of overlapping sites of left breast in female, estrogen receptor positive ([place]) 03/14/2016   Dx in 09/2014, s/p bilateral mastectomies and ALND, 0/10 LN. 1.4 cm  Grade I invasive lobular, ER and PR +/Her--, Ki 67 <5% Tried anastrozole for one month, but developed suicidal idea   Osteoarthritis, knee 09/24/2019   Xray 09/2019   Parkinson's disease ([place]) 2012   Psoriatic arthritis ([place])    PTSD (post-traumatic stress disorder)    Secondary hyperparathyroidism ([place])    Tardive dyskinesia     Past Surgical History:  Procedure Laterality Date   [place] Bilateral 09/19/2014   TONSILLECTOMY  1970   URETERAL REIMPLANTION Bilateral 1974    There were no vitals filed for this visit.   Subjective Assessment -  05/09/21 1221     Subjective The patients right hip and knee have been sore. She reports it comes and goes.    Limitations Standing;Walking;House hold activities    How long can you stand comfortably? Could be immediate or an hour. For sure after an hour.    How long can you walk comfortably? has to use a device for limited distances    Patient Stated Goals Getting stronger, improving balance, pain free    Currently in Pain? Yes    Pain Score 5     Pain Location Leg    Pain Orientation Right;Left    Pain Descriptors / Indicators Aching    Pain Type Chronic pain    Pain Onset More than a month ago    Pain Frequency Intermittent    Aggravating Factors  standing and walking    Pain Relieving Factors light stretching    Effect of Pain on Daily Activities pain sleeping                     Manual Therapy: Gentle knee extension stretching; LAD grade I and II oscillations; Trigger point release and rolling to gluteal and IT band.                      PT Short Term Goals -  04/19/21 1508       PT SHORT TERM GOAL #1   Title Pt will improve tug with rollater below 14 secs to show improvement in ambulation.    Baseline 14 seconds 12/28    Time 3    Period Weeks    Status Achieved    Target Date 05/17/21      PT SHORT TERM GOAL #2   Title Patient will improve strength for bilateral hip flexion to 5/5.    Baseline tested with hand dyno today improving in most plains    Time 3    Period Weeks    Status Partially Met    Target Date 05/17/21      PT SHORT TERM GOAL #3   Title Patient will performed 5x sit to stand below 10  secs to show improvement is functional strenght.    Baseline 11    Time 4    Period Weeks    Status On-going    Target Date 05/17/21      PT SHORT TERM GOAL #4   Title Pt will improve 5x sit<>stand to less than or equal to 18 seconds for improved functional strength.    Baseline 16.93 sec 06/30/2019    Time 4    Period Weeks     Status On-going      PT SHORT TERM GOAL #5   Title Pt/husband will verbalize understanding of fall prevention in home environment.    Time 4    Period Weeks    Status On-going    Target Date 05/17/21               PT Long Term Goals - 04/19/21 1509       PT LONG TERM GOAL #1   Title Pt will be be able to perform six minute walk test and ambulate 700 feet without a device or with a less restricted device such as a cane independently in order to ambualte in the community    Baseline qill test next visit    Time 8    Period Weeks    Status On-going    Target Date 06/14/21      PT LONG TERM GOAL #2   Title Pt will go up and down 6 steps without increased pain    Baseline qoerking on steps    Time 6    Period Weeks    Status On-going    Target Date 11/01/20      PT LONG TERM GOAL #3   Title Patient will be independent with pool program and general strengthening program in order to continue to progress mobility    Baseline working on her pool program    Time 6    Period Weeks    Status On-going    Target Date 11/01/20                   Plan - 05/09/21 1547     Clinical Impression Statement The patient had pain coming in. After manual therapy she had a signifcant improvement in ability to reach her shoe and her pain. We were still able to do some gym exercises with her but we focuesed more on upper body postural and core exercises. She is making good progress. We will see her Friday in the [place].    Personal Factors and Comorbidities Fitness;Time since onset of injury/illness/exacerbation;Comorbidity 3+    Comorbidities multiple pain locations, back, hip, knee, Decreased mobility post covid and hospitalization last year,  Examination-Activity Limitations Stand;Locomotion Level;Bend;Dressing;Squat;Stairs    Examination-Participation Restrictions Cleaning;Meal Prep;Yard Work;Community Activity;Shop;Laundry    Stability/Clinical Decision Making Evolving/Moderate  complexity    Clinical Decision Making Moderate    Rehab Potential Good    PT Frequency 2x / week    PT Duration 6 weeks    PT Treatment/Interventions ADLs/Self Care Home Management;Cryotherapy;Electrical Stimulation;Iontophoresis 68m/ml Dexamethasone;Ultrasound;DME Instruction;Gait training;Stair training;Therapeutic activities;Therapeutic exercise;Neuromuscular re-education;Patient/family education;Manual techniques;Passive range of motion;Dry needling;Taping    PT Next Visit Plan advance strengtheing and aerobic capacity    PT Home Exercise Plan [facility] URL: [email or web address]  Date: 09/06/2020  Prepared by: DCarolyne Littles   Exercises  Seated Knee Extension with Resistance - 1 x daily - 7 x weekly - 3 sets - 10 reps  Seated March with Resistance - 1 x daily - 7 x weekly - 3 sets - 10 reps  Seated Hip Abduction with Resistance - 1 x daily - 7 x weekly - 3 sets - 10 reps    Consulted and Agree with Plan of Care Patient             Patient will benefit from skilled therapeutic intervention in order to improve the following deficits and impairments:  Abnormal gait, Pain, Increased muscle spasms, Decreased activity tolerance, Decreased endurance, Decreased range of motion, Decreased strength, Impaired flexibility, Difficulty walking, Decreased balance, Decreased safety awareness, Decreased knowledge of precautions  Visit Diagnosis: Other abnormalities of gait and mobility  Unsteadiness on feet  Muscle weakness (generalized)  Pain in left hip  Stiffness of left hip, not elsewhere classified     Problem List Patient Active Problem List   Diagnosis Date Noted   Osteoarthritis, knee 09/24/2019   CKD (chronic kidney disease) stage 4, GFR 15-29 ml/min ([place] 09/24/2019   Severe recurrent major depression without psychotic features ([place] 09/09/2019   Bipolar I disorder, most recent episode depressed ([facility]    Transaminitis    Essential hypertension     Encephalopathy 04/26/2019   Leukocytosis 04/21/2019   Thrombocytopenia ([facility] 09/04/2018   Vitamin D deficiency 09/04/2018   Psoriatic arthritis ([facility] 09/04/2018   Tubular adenoma of colon 05/28/2017   History of colonic polyps 05/28/2017   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 09/12/2016   Malignant neoplasm of overlapping sites of left breast in female, estrogen receptor positive ([place] 03/14/2016   Chronic kidney disease, stage III (moderate) 01/19/2016   Tardive dyskinesia 10/18/2015   History of breast cancer left 2016 12/27/2014   Osteopenia determined by x-ray 10/31/2014   Rosacea 05/21/2007   Bipolar affective disorder, mixed ([place] 08/16/2004   Acquired hypothyroidism 04/03/1996    DCarney Living PT 05/09/2021, 3:51 PM  [facility] [address]GC[address] [place] [address]Phone: [phone]  Fax:  [phone] Name: NLE FERRAZMRN: [id number] of Birth: 7Jul 20, 1952

## 2021-05-09 NOTE — Telephone Encounter (Signed)
Scheduled appt per 2/27 referral. Pt is aware of appt date and time. Pt is aware to arrive 15 mins prior to appt time and to bring and updated insurance card. Pt is aware of appt location.   °

## 2021-05-10 ENCOUNTER — Ambulatory Visit (INDEPENDENT_AMBULATORY_CARE_PROVIDER_SITE_OTHER): Payer: Medicare Other | Admitting: Adult Health

## 2021-05-10 ENCOUNTER — Encounter: Payer: Self-pay | Admitting: Adult Health

## 2021-05-10 ENCOUNTER — Other Ambulatory Visit: Payer: Self-pay

## 2021-05-10 DIAGNOSIS — F3132 Bipolar disorder, current episode depressed, moderate: Secondary | ICD-10-CM

## 2021-05-10 DIAGNOSIS — F4310 Post-traumatic stress disorder, unspecified: Secondary | ICD-10-CM | POA: Diagnosis not present

## 2021-05-10 DIAGNOSIS — F411 Generalized anxiety disorder: Secondary | ICD-10-CM | POA: Diagnosis not present

## 2021-05-10 DIAGNOSIS — F99 Mental disorder, not otherwise specified: Secondary | ICD-10-CM

## 2021-05-10 DIAGNOSIS — F5105 Insomnia due to other mental disorder: Secondary | ICD-10-CM

## 2021-05-10 NOTE — Progress Notes (Signed)
Tiffany [name] [id number] 1950/03/18 71 y.o.  Subjective:   Patient ID:  Tiffany [name] is a 70 y.o. (DOB 05/20/1950) female.  Chief Complaint: No chief complaint on file.   HPI Tiffany [name] presents to the office today for follow-up of PTSD, insomnia, GAD, BPD 1.  Describes mood today as "ok". Pleasant. Tearful at times. Mood symptoms - reports depression and anxiety. Denies irritability. Mood stable - denies fluctuations. Reports a decline in mood. Having flashbacks. No availability with therapist - felt abandoned - had a flashback to when she was abandoned at 71 years old.Going to [employer] therapy. Had trouble being in the water - fearful - has a new aqua therapist - cancelled on Monday because she was having trust issues. Reports seeing PCP and was advised to have a colonoscopy - was set on not going but went to her appointment. Stating "I'm trying to rationalize my thoughts". Trying to separate facts from feelings. Reports some negative thoughts. Working with P/T - doing aquatics. Concerned about her daughter. Feels like current medications are helpful. Seeing Dr. Rica [name] weekly. Varying interest and motivation. Taking medications as prescribed. Energy levels vary. Active, does not have a regular exercise routine. Going to P/T twice a week. Enjoys some usual interests and activities. Married. Lives with husband their daughter. Talking to family and friends.  Appetite adequate. Weight loss.   Sleeps well most nights. Averages 9 to 10 hours. Focus and concentration stable. Completing tasks. Managing some aspects of household. Retired.  Denies SI - "no plans or intents". Denies HI.  Denies AH Denies VH  Reports flashbacks daily - not debilitating.  Previous medications: Celexa, Zyprexa, Tegretol, Depakote, Serzone, Topamax, Seroquel, Effexor, Lexapro, Desipramine, Neurontin, Abilify, Geodon, Propanolol, Cymbalta, Cogentin, Trihexyphenadyl, Sinmmet, Provigil, Selegiline, Requip, Amantadine,  Prozac, Mirapex, Azilect, Metoclopramide, Baclofen, Artane, Namenda, Latuda.    [place] Office Visit from 02/16/2021 in [place] Visit from 05/14/2019 in [place]  Total GAD-7 Score 13 [place] [facility] Visit from 06/07/2019 in [facility]  Total Score (max 30 points ) 29      PHQ2-9    Flowsheet Row Clinical Support from 04/02/2021 in [place] Visit from 02/16/2021 in [place] from 07/26/2020 in [place] Visit from 06/05/2020 in [place] from 03/16/2020 in [place]  PHQ-2 Total Score 1 3 1 4 2   PHQ-9 Total Score 1 6 -- 11 3      Flowsheet Row Clinical Support from 04/02/2021 in [place] [facility] from 03/21/2021 in [facility] from 02/03/2021 in [facility] [place] No Risk No Risk No Risk        Review of Systems:  Review of Systems  Musculoskeletal:  Negative for gait problem.  Neurological:  Negative for tremors.  Psychiatric/Behavioral:         Please refer to HPI   Medications: I have reviewed the patient's current medications.  Current Outpatient Medications  Medication Sig Dispense Refill   Certolizumab Pegol (CIMZIA) 2 X 200 MG KIT See admin instructions.     cholecalciferol (VITAMIN D3) 25 MCG (1000 UNIT) tablet Take 1,000 Units by mouth daily.     lamoTRIgine (LAMICTAL) 200 MG tablet Take 1 tablet (200 mg total) by mouth at bedtime. [place]  tablet 3   levothyroxine (SYNTHROID) 112 MCG tablet Take 1 tablet (112 mcg total) by mouth daily before breakfast. 90 tablet 0   lithium carbonate 300 MG capsule TAKE 1 CAPSULE(300 MG) BY MOUTH AT BEDTIME 90 capsule 3   LORazepam (ATIVAN) 1 MG tablet TAKE 1 TABLET(1  MG) BY MOUTH TWICE DAILY AS NEEDED 60 tablet 2   metroNIDAZOLE (METROGEL) 0.75 % gel Apply to face 1-2 times daily 45 g 0   prazosin (MINIPRESS) 1 MG capsule Take one capsule at bedtime for two weeks and then discontinue. 14 capsule 0   sodium bicarbonate 650 MG tablet Take 650 mg by mouth daily.     No current facility-administered medications for this visit.    Medication Side Effects: None  Allergies:  Allergies  Allergen Reactions   Aripiprazole Other (See Comments)    Parkinsonism      Lactose Intolerance (Gi) Diarrhea   Methotrexate Other (See Comments)    Hair loss, severe stomatitis     Cefdinir Diarrhea    Other reaction(s): Diarrhea Yeast infection and fever; negative c diff   Etanercept Other (See Comments)    Headaches     Exemestane Other (See Comments)    Suicidal thoughts with medication     Fluoxetine Other (See Comments)    Parkinsonism   Lactose     Other reaction(s): diarrhea Other reaction(s): diarrhea Other reaction(s): diarrhea   Methylprednisolone Sodium Succ Other (See Comments)    Agitated mania   Epinephrine Palpitations    tachycardia    Nitrofurantoin Nausea And Vomiting and Rash      Other reaction(s): rash, diarrhea Other reaction(s): rash, diarrhea Other reaction(s): rash, diarrhea Other reaction(s): rash, diarrhea Other reaction(s): rash, diarrhea    Past Medical History:  Diagnosis Date   Bipolar 1 disorder (HCC)    Breast cancer (HCC)    Chronic diarrhea    loose stools twice a day on average for years   Chronic kidney disease (CKD), stage III (moderate) ([place])    Depression 1987   Hospitalization or health care facility admission within last 6 months 04/2019   for fall/seizures   Malignant neoplasm of overlapping sites of left breast in female, estrogen receptor positive ([place]) 03/14/2016   Dx in 09/2014, s/p bilateral mastectomies and ALND, 0/10 LN. 1.4 cm  Grade I invasive lobular, ER and PR +/Her--, Ki 67 <5% Tried  anastrozole for one month, but developed suicidal idea   Osteoarthritis, knee 09/24/2019   Xray 09/2019   Parkinson's disease ([place]) 2012   Psoriatic arthritis ([place])    PTSD (post-traumatic stress disorder)    Secondary hyperparathyroidism ([place])    Tardive dyskinesia     Past Medical History, Surgical history, Social history, and Family history were reviewed and updated as appropriate.   Please see review of systems for further details on the patient's review from today.   Objective:   Physical Exam:  There were no vitals taken for this visit.  Physical Exam Constitutional:      General: She is not in acute distress. Musculoskeletal:        General: No deformity.  Neurological:     Mental Status: She is alert and oriented to person, place, and time.     Coordination: Coordination normal.  Psychiatric:        Attention and Perception: Attention and perception normal. She does not perceive auditory or visual hallucinations.        Mood and Affect: Mood normal. Mood is not  anxious or depressed. Affect is not labile, blunt, angry or inappropriate.        Speech: Speech normal.        Behavior: Behavior normal.        Thought Content: Thought content normal. Thought content is not paranoid or delusional. Thought content does not include homicidal or suicidal ideation. Thought content does not include homicidal or suicidal plan.        Cognition and Memory: Cognition and memory normal.        Judgment: Judgment normal.     Comments: Insight intact    Lab Review:     Component Value Date/Time   NA 142 03/21/2021 1123   NA 138 06/08/2019 0000   K 4.0 03/21/2021 1123   CL 109 03/21/2021 1123   CO2 24 03/21/2021 1123   GLUCOSE 83 03/21/2021 1123   BUN 30 (H) 03/21/2021 1123   BUN 29 (A) 06/08/2019 0000   CREATININE 2.28 (H) 03/21/2021 1123   CREATININE 2.03 (H) 02/02/2021 1012   CALCIUM 9.7 03/21/2021 1123   PROT 7.8 03/21/2021 1123   ALBUMIN 4.5 03/21/2021 1123   AST 15  03/21/2021 1123   ALT 12 03/21/2021 1123   ALKPHOS 106 03/21/2021 1123   BILITOT 1.2 03/21/2021 1123   GFRNONAA 22 (L) 03/21/2021 1123   GFRAA 25 (L) 09/13/2019 1243       Component Value Date/Time   WBC 8.7 03/21/2021 1123   RBC 4.64 03/21/2021 1123   HGB 13.5 03/21/2021 1123   HCT 43.9 03/21/2021 1123   PLT 225 03/21/2021 1123   MCV 94.6 03/21/2021 1123   MCH 29.1 03/21/2021 1123   MCHC 30.8 03/21/2021 1123   RDW 14.2 03/21/2021 1123   LYMPHSABS 3.1 03/21/2021 1123   MONOABS 0.5 03/21/2021 1123   EOSABS 0.3 03/21/2021 1123   BASOSABS 0.1 03/21/2021 1123    Lithium Lvl  Date Value Ref Range Status  02/02/2021 0.6 0.6 - 1.2 mmol/L Final     No results found for: PHENYTOIN, PHENOBARB, VALPROATE, CBMZ   .res Assessment: Plan:    Plan:  Therapist - Andy Mitchum   Lorazepam 32m BID for anxiety - make take one tablet extra for severe anxiety symptoms. Taking one at night routinely. Lamictal 2052mat hs Lithium 30038maily   Labs - lithium level, TSH, CMP  RTC 4/6 weeks  Counseled patient regarding potential benefits, risks, and side effects of Lamictal to include potential risk of Stevens-Johnson syndrome. Advised patient to stop taking Lamictal and contact office immediately if rash develops and to seek urgent medical attention if rash is severe and/or spreading quickly.  Discussed potential benefits, risk, and side effects of benzodiazepines to include potential risk of tolerance and dependence, as well as possible drowsiness.  Advised patient not to drive if experiencing drowsiness and to take lowest possible effective dose to minimize risk of dependence and tolerance.   Diagnoses and all orders for this visit:  PTSD (post-traumatic stress disorder)  Bipolar I disorder, most recent episode depressed, moderate (HCC)  Generalized anxiety disorder  Insomnia due to other mental disorder     Please see After Visit Summary for patient specific  instructions.  Future Appointments  Date Time Provider DepProspect/05/2021 11:45 AM CarShelbie HutchingB-REH [facility]  05/16/2021  1:00 PM CarCarney LivingT [facility]  05/18/2021 11:15 AM ZieVedia PereyraT [facility]  05/23/2021  2:30 PM CarCarney LivingT [facility]  05/25/2021 10:00 AM Mitchum,  Herbie Baltimore, PhD CP-CP None  05/25/2021  1:45 PM Carlson-Long, Stephani Police [facility]  05/28/2021  1:00 PM Carney Living, PT [facility]  05/31/2021  1:00 PM [name], PhD CP-CP None  06/04/2021 11:45 AM Carney Living, PT [facility]  06/05/2021 10:00 AM Flippin, Mammie Lorenzo, Counselor [facility] None  06/05/2021 11:00 AM [facility] LAB [facility] None  06/06/2021 11:40 AM Camren Lipsett, Berdie Ogren, NP CP-CP None  06/08/2021 11:00 AM [name], PhD CP-CP None  06/13/2021  1:00 PM [name], PhD CP-CP None  06/20/2021 11:00 AM [name], PhD CP-CP None  07/09/2021 11:00 AM Inda Coke, PA [facility]  04/15/2022  1:45 PM [facility] HEALTH COACH [facility]    No orders of the defined types were placed in this encounter.   -------------------------------

## 2021-05-11 ENCOUNTER — Ambulatory Visit (HOSPITAL_BASED_OUTPATIENT_CLINIC_OR_DEPARTMENT_OTHER): Payer: Medicare Other | Admitting: Physical Therapy

## 2021-05-11 ENCOUNTER — Encounter: Payer: Self-pay | Admitting: Gastroenterology

## 2021-05-11 ENCOUNTER — Encounter (HOSPITAL_BASED_OUTPATIENT_CLINIC_OR_DEPARTMENT_OTHER): Payer: Self-pay | Admitting: Physical Therapy

## 2021-05-11 DIAGNOSIS — R2689 Other abnormalities of gait and mobility: Secondary | ICD-10-CM | POA: Diagnosis not present

## 2021-05-11 DIAGNOSIS — R2681 Unsteadiness on feet: Secondary | ICD-10-CM

## 2021-05-11 DIAGNOSIS — M6281 Muscle weakness (generalized): Secondary | ICD-10-CM

## 2021-05-11 NOTE — Therapy (Signed)
?OUTPATIENT PHYSICAL THERAPY TREATMENT NOTE ? ? ?Patient Name: Tiffany [name] ?MRN: [id number] ?DOB:27-Jun-1950, 71 y.o., female ?Today's Date: 05/11/2021 ? ?PCP: Inda Coke, PA ?REFERRING PROVIDER: Inda Coke, PA ? ? PT End of Session - 05/11/21 1143   ? ? Visit Number 23   ? Number of Visits 30   ? Date for PT Re-Evaluation 05/30/21   ? Authorization Type progress note performed on visit 18 Next on 28   ? PT Start Time 1143   ? PT Stop Time 1228   ? PT Time Calculation (min) 45 min   ? Activity Tolerance Patient tolerated treatment well   ? Behavior During Therapy [facility] for tasks assessed/performed   ? ?  ?  ? ?  ? ? ?Past Medical History:  ?Diagnosis Date  ? Bipolar 1 disorder ([place])   ? Breast cancer ([place])   ? Chronic diarrhea   ? loose stools twice a day on average for years  ? Chronic kidney disease (CKD), stage III (moderate) (HCC)   ? Depression 1987  ? Hospitalization or health care facility admission within last 6 months 04/2019  ? for fall/seizures  ? Malignant neoplasm of overlapping sites of left breast in female, estrogen receptor positive ([place]) 03/14/2016  ? Dx in 09/2014, s/p bilateral mastectomies and ALND, 0/10 LN. 1.4 cm  Grade I invasive lobular, ER and PR +/Her--, Ki 67 <5% Tried anastrozole for one month, but developed suicidal idea  ? Osteoarthritis, knee 09/24/2019  ? Xray 09/2019  ? Parkinson's disease ([place]) 2012  ? Psoriatic arthritis ([place])   ? PTSD (post-traumatic stress disorder)   ? Secondary hyperparathyroidism ([place])   ? Tardive dyskinesia   ? ?Past Surgical History:  ?Procedure Laterality Date  ? ABDOMINAL HYSTERECTOMY  1987  ? CHOLECYSTECTOMY  1979  ? [place] OF UTERUS  1973  ? MASTECTOMY Bilateral 09/19/2014  ? TONSILLECTOMY  1970  ? URETERAL REIMPLANTION Bilateral 1974  ? ?Patient Active Problem List  ? Diagnosis Date Noted  ? Osteoarthritis, knee 09/24/2019  ? CKD (chronic kidney disease) stage 4, GFR 15-29 ml/min (HCC) 09/24/2019  ? Severe recurrent major depression  without psychotic features ([place]) 09/09/2019  ? Bipolar I disorder, most recent episode depressed ([place])   ? Transaminitis   ? Essential hypertension   ? Encephalopathy 04/26/2019  ? Leukocytosis 04/21/2019  ? Thrombocytopenia ([place]) 09/04/2018  ? Vitamin D deficiency 09/04/2018  ? Psoriatic arthritis ([place]) 09/04/2018  ? Tubular adenoma of colon 05/28/2017  ? History of colonic polyps 05/28/2017  ? 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 Malignant neoplasm of overlapping sites of left breast in female, estrogen receptor positive ([place]) 03/14/2016  ? Chronic kidney disease, stage III (moderate) 01/19/2016  ? Tardive dyskinesia 10/18/2015  ? History of breast cancer left 2016 12/27/2014  ? Osteopenia determined by x-ray 10/31/2014  ? Rosacea 05/21/2007  ? Bipolar affective disorder, mixed ([place]) 08/16/2004  ? Acquired hypothyroidism 04/03/1996  ? ? ?THERAPY DIAG:  ?Other abnormalities of gait and mobility ? ?Unsteadiness on feet ? ?Muscle weakness (generalized) ?SUBJECTIVE: Pt reports that her RLE continues to be more painful at night, often waking her up.  She and her husband just joined [employer], so she plans to use pool when her membership is active.   ? ?PAIN:  ?Are you having pain?  ?NPRS scale: 8/10 at night, 4/10 during day; 0/10 at rest at moment  ?Pain location: Knee   ?Pain orientation: Right  ?PAIN TYPE: ache ? ?  Aggravating factors: walking too long  ?Relieving factors: water exercise, resting ? ?  ?Pt seen for aquatic therapy today.  Treatment took place in water 3.25-3.6 ft in depth at the [facility] pool. Temp of water was 96?.  Pt entered/exited the pool via stairs  combination of step to/through pattern independently with bilat rail. ?  ?At pool side:  ?IASTM to Rt quad to decrease fascial restrictions and improve mobility ? ?Walking for warm up: ?Pt amb fwd, back and side stepping holding yellow noodle with supervision.  6 widths of each.  ?High knee  marching.  ? ? 3 way hip with hand on pool edge.  ?Sit to/from stand without UE support from stairs ?Tandem stance without support x 20 sec.  ?-step ups forward RLE x 10, lateral step ups RLE x 10  ? ?Standing hamstring stretch with foot on step x 20 sec x 2 ?Standing Rt quad stretch with foot on step (behind her) x 60 sec ?  ?  ?   ?  ?  PT Short Term Goals - 04/19/21 1508   ?  ?    ?     ?  PT SHORT TERM GOAL #1  ?  Title Pt will improve tug with rollater below 14 secs to show improvement in ambulation.   ?  Baseline 14 seconds 12/28   ?  Time 3   ?  Period Weeks   ?  Status Achieved   ?  Target Date 05/17/21   ?     ?  PT SHORT TERM GOAL #2  ?  Title Patient will improve strength for bilateral hip flexion to 5/5.   ?  Baseline tested with hand dyno today improving in most plains   ?  Time 3   ?  Period Weeks   ?  Status Partially Met   ?  Target Date 05/17/21   ?     ?  PT SHORT TERM GOAL #3  ?  Title Patient will performed 5x sit to stand below 10  secs to show improvement is functional strenght.   ?  Baseline 11   ?  Time 4   ?  Period Weeks   ?  Status On-going   ?  Target Date 05/17/21   ?     ?  PT SHORT TERM GOAL #4  ?  Title Pt will improve 5x sit<>stand to less than or equal to 18 seconds for improved functional strength.   ?  Baseline 16.93 sec 06/30/2019   ?  Time 4   ?  Period Weeks   ?  Status On-going   ?     ?  PT SHORT TERM GOAL #5  ?  Title Pt/husband will verbalize understanding of fall prevention in home environment.   ?  Time 4   ?  Period Weeks   ?  Status On-going   ?  Target Date 05/17/21   ?  ?   ?  ?  ?   ?  ?  ?  ?  PT Long Term Goals - 04/19/21 1509   ?  ?    ?     ?  PT LONG TERM GOAL #1  ?  Title Pt will be be able to perform six minute walk test and ambulate 700 feet without a device or with a less restricted device such as a cane independently in order to ambualte in the community   ?  Baseline qill  test next visit   ?  Time 8   ?  Period Weeks   ?  Status On-going   ?  Target Date  06/14/21   ?     ?  PT LONG TERM GOAL #2  ?  Title Pt will go up and down 6 steps without increased pain   ?  Baseline working on steps, still painful  ?  Time 6   ?  Period Weeks   ?  Status On-going   ?  Target Date 06/14/21  ?     ?  PT LONG TERM GOAL #3  ?  Title Patient will be independent with pool program and general strengthening program in order to continue to progress mobility   ?  Baseline working on her pool program   ?  Time 6   ?  Period Weeks   ?  Status On-going   ?  Target Date 06/14/21  ?  ?   ?  ?  ?   ?  ?  ?  ?  ?  ?  ?  ?  Plan -   ?  ?  Clinical Impression Statement Pt continues to report mild fear when being in water without support from pool edge or floatation device, however this has greatly improved.  She reported elimination of pain when in water.  She completed step ups without pain and was able to complete sit to/from stand without UE support (while sitting on low stairs).  Pt is making gradual progress towards STG/ LTGS.   ?  ?  Stability/Clinical Decision Making Evolving/Moderate complexity   ?  Clinical Decision Making Moderate   ?  Rehab Potential Good   ?  PT Duration 6 weeks   ?  PT Next Visit Plan Assess STG (05/17/21 goal dates) - 5x STS/ TUG.  Continue progressive strengthening of LE.   ?  ?   ?  ?  ?   ?  ?  ?Patient will benefit from skilled therapeutic intervention in order to improve the following deficits and impairments:  Abnormal gait, Pain, Increased muscle spasms, Decreased activity tolerance, Decreased endurance, Decreased range of motion, Decreased strength, Impaired flexibility, Difficulty walking, Decreased balance, Decreased safety awareness, Decreased knowledge of precautions ?  ?Visit Diagnosis: ?Other abnormalities of gait and mobility ?  ?Unsteadiness on feet ?  ?Muscle weakness (generalized) ?  ?Pain in left hip ?  ?Stiffness of left hip, not elsewhere classified ?  ?  ?Kerin Perna, PTA ?05/11/21 1:20 PM ? ?  ? ?

## 2021-05-16 ENCOUNTER — Ambulatory Visit (HOSPITAL_BASED_OUTPATIENT_CLINIC_OR_DEPARTMENT_OTHER): Payer: Medicare Other | Admitting: Physical Therapy

## 2021-05-16 ENCOUNTER — Encounter (HOSPITAL_BASED_OUTPATIENT_CLINIC_OR_DEPARTMENT_OTHER): Payer: Self-pay | Admitting: Physical Therapy

## 2021-05-16 ENCOUNTER — Other Ambulatory Visit: Payer: Self-pay

## 2021-05-16 DIAGNOSIS — R2681 Unsteadiness on feet: Secondary | ICD-10-CM

## 2021-05-16 DIAGNOSIS — R2689 Other abnormalities of gait and mobility: Secondary | ICD-10-CM

## 2021-05-16 DIAGNOSIS — M25652 Stiffness of left hip, not elsewhere classified: Secondary | ICD-10-CM

## 2021-05-16 DIAGNOSIS — M25552 Pain in left hip: Secondary | ICD-10-CM

## 2021-05-16 DIAGNOSIS — M6281 Muscle weakness (generalized): Secondary | ICD-10-CM

## 2021-05-16 IMAGING — DX DG CHEST 1V PORT
1 series · 1 of 1 positions shown · non-contrast
Comparison: 04/06/2019

CLINICAL DATA: Weakness, recent COVID

EXAM:
PORTABLE CHEST 1 VIEW

[chest ap]
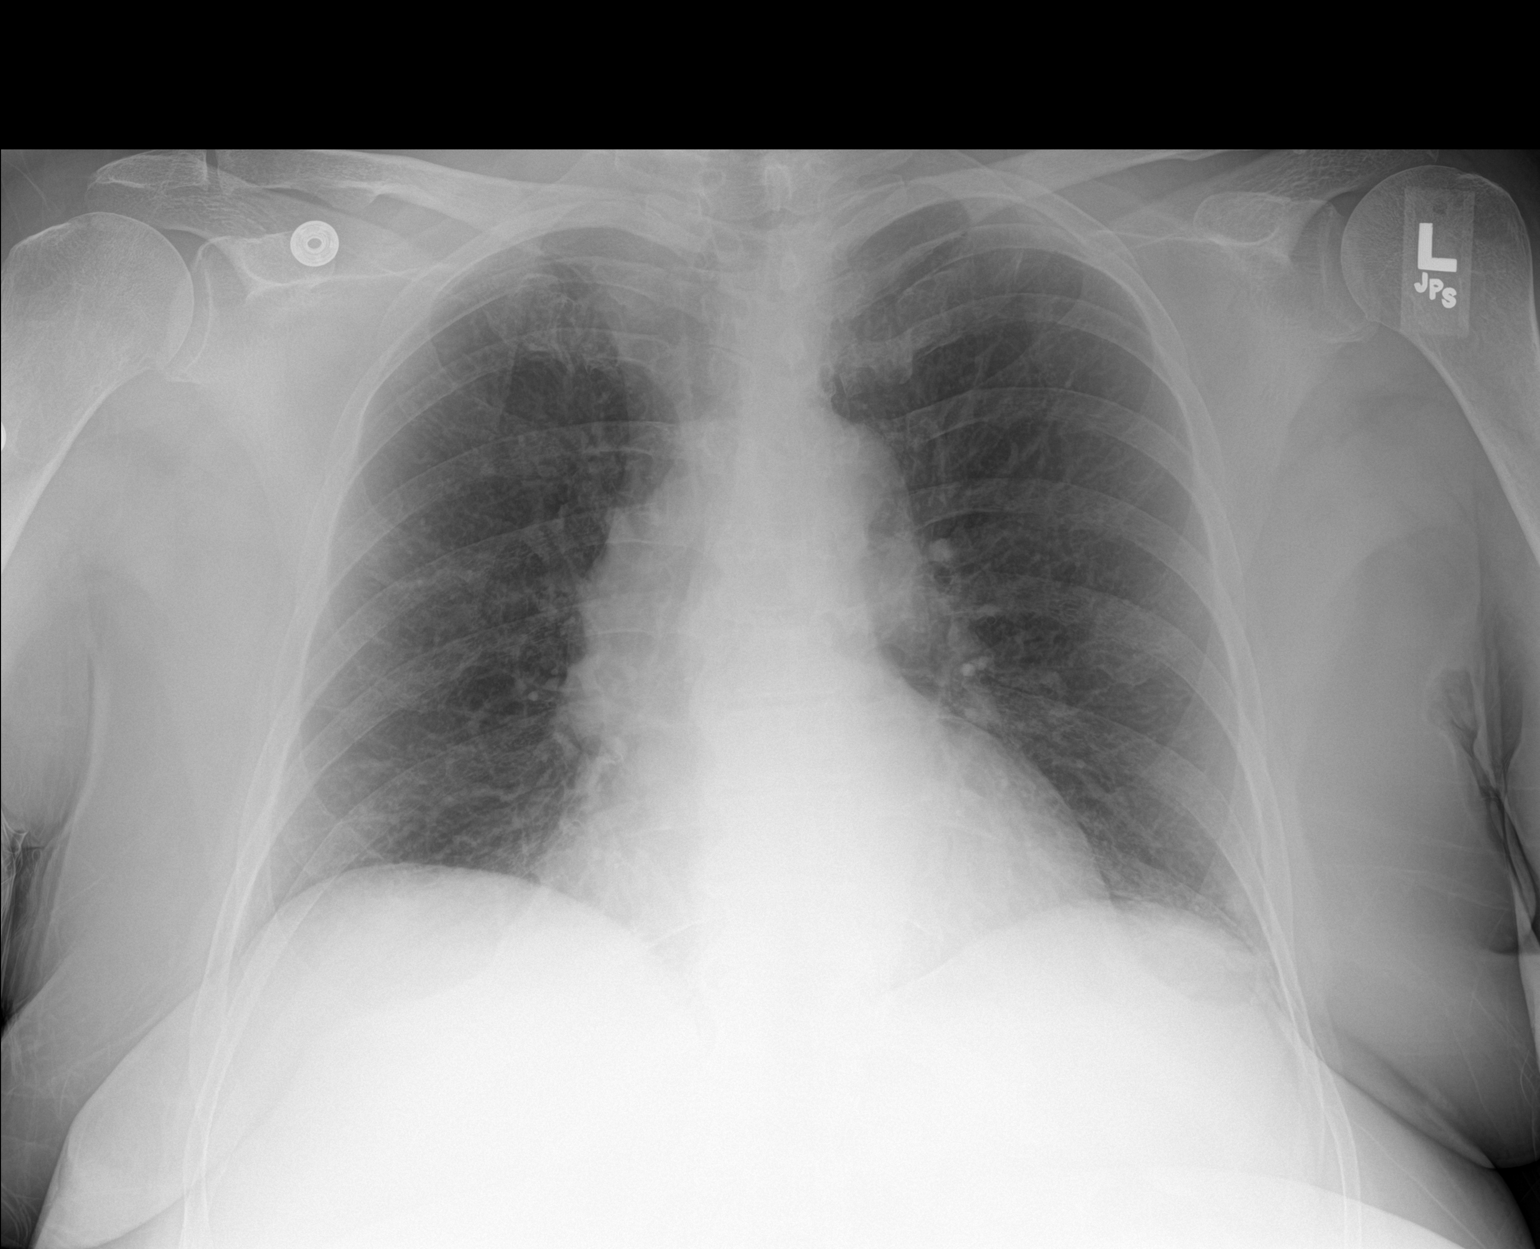

[1 of 1 positions shown; findings below may reference images not displayed]

FINDINGS: Lungs are essentially clear. Mild residual left basilar opacity,
possibly atelectasis. No pleural effusion or pneumothorax.

The heart is normal in size.
IMPRESSION: Mild residual left basilar opacity, possibly atelectasis.

## 2021-05-17 ENCOUNTER — Encounter (HOSPITAL_BASED_OUTPATIENT_CLINIC_OR_DEPARTMENT_OTHER): Payer: Self-pay | Admitting: Physical Therapy

## 2021-05-17 NOTE — Therapy (Signed)
[place], [name], [address] Phone: [phone]   Fax:  [phone]  Physical Therapy Treatment  Patient Details  Name: Tiffany [name] MRN: [id number] Date of Birth: 11-May-1950 Referring Provider (PT): Inda Coke, [place]   Encounter Date: 05/16/2021   PT End of Session - 05/16/21 1338     Visit Number 24    Number of Visits 30    Date for PT Re-Evaluation 05/30/21    Authorization Type progress note performed on visit 18 Next on 28    PT Start Time 1300    PT Stop Time 1342    PT Time Calculation (min) 42 min    Activity Tolerance Patient tolerated treatment well    Behavior During Therapy [facility] for tasks assessed/performed             Past Medical History:  Diagnosis Date   Bipolar 1 disorder ([place])    Breast cancer ([place])    Chronic diarrhea    loose stools twice a day on average for years   Chronic kidney disease (CKD), stage III (moderate) ([place])    Depression 1987   Hospitalization or health care facility admission within last 6 months 04/2019   for fall/seizures   Malignant neoplasm of overlapping sites of left breast in female, estrogen receptor positive ([place]) 03/14/2016   Dx in 09/2014, s/p bilateral mastectomies and ALND, 0/10 LN. 1.4 cm  Grade I invasive lobular, ER and PR +/Her--, Ki 67 <5% Tried anastrozole for one month, but developed suicidal idea   Osteoarthritis, knee 09/24/2019   Xray 09/2019   Parkinson's disease ([place]) 2012   Psoriatic arthritis ([place])    PTSD (post-traumatic stress disorder)    Secondary hyperparathyroidism ([place])    Tardive dyskinesia     Past Surgical History:  Procedure Laterality Date   [place] Bilateral 09/19/2014   TONSILLECTOMY  1970   URETERAL REIMPLANTION Bilateral 1974    There were no vitals filed for this visit.   Subjective Assessment -  05/16/21 1309     Subjective The patient feels good today. She felt like she over-did it over the weekend but then it got better.    Limitations Standing;Walking;House hold activities    How long can you sit comfortably? no issues    How long can you stand comfortably? Could be immediate or an hour. For sure after an hour.    How long can you walk comfortably? has to use a device for limited distances    Patient Stated Goals Getting stronger, improving balance, pain free    Currently in Pain? Yes    Pain Score 2     Pain Location Leg    Pain Orientation Right;Left    Pain Descriptors / Indicators Aching    Pain Type Chronic pain    Pain Onset More than a month ago    Pain Frequency Intermittent    Aggravating Factors  standing    Pain Relieving Factors light stretching    Effect of Pain on Daily Activities pain sleeping    Multiple Pain Sites No                Manual Therapy: Gentle knee extension stretching; LAD grade I and II oscillations; Trigger point release and rolling to gluteal and IT band.   Knee: quad set 3x10  SAQ 3x10 bilateral  SLR 2x10 bilateral fatigued on the last set   supine march 2x10     Row machine 3x10 15 lbs   Lifestyles leg press 3x15 30 lbs                          PT Education - 05/16/21 1332     Education Details reviewed gym equipment    Person(s) Educated Patient    Methods Explanation;Demonstration;Tactile cues;Verbal cues    Comprehension Verbalized understanding;Returned demonstration;Verbal cues required;Tactile cues required              PT Short Term Goals - 04/19/21 1508       PT SHORT TERM GOAL #1   Title Pt will improve tug with rollater below 14 secs to show improvement in ambulation.    Baseline 14 seconds 12/28    Time 3    Period Weeks    Status Achieved    Target Date 05/17/21      PT SHORT TERM GOAL #2   Title Patient will improve strength for bilateral hip flexion to 5/5.     Baseline tested with hand dyno today improving in most plains    Time 3    Period Weeks    Status Partially Met    Target Date 05/17/21      PT SHORT TERM GOAL #3   Title Patient will performed 5x sit to stand below 10  secs to show improvement is functional strenght.    Baseline 11    Time 4    Period Weeks    Status On-going    Target Date 05/17/21      PT SHORT TERM GOAL #4   Title Pt will improve 5x sit<>stand to less than or equal to 18 seconds for improved functional strength.    Baseline 16.93 sec 06/30/2019    Time 4    Period Weeks    Status On-going      PT SHORT TERM GOAL #5   Title Pt/husband will verbalize understanding of fall prevention in home environment.    Time 4    Period Weeks    Status On-going    Target Date 05/17/21               PT Long Term Goals - 04/19/21 1509       PT LONG TERM GOAL #1   Title Pt will be be able to perform six minute walk test and ambulate 700 feet without a device or with a less restricted device such as a cane independently in order to ambualte in the community    Baseline qill test next visit    Time 8    Period Weeks    Status On-going    Target Date 06/14/21      PT LONG TERM GOAL #2   Title Pt will go up and down 6 steps without increased pain    Baseline qoerking on steps    Time 6    Period Weeks    Status On-going    Target Date 11/01/20      PT LONG TERM GOAL #3   Title Patient will be independent with pool program and general strengthening program in order to continue to progress mobility    Baseline working on her pool program    Time 6    Period Weeks    Status On-going    Target Date 11/01/20  Plan - 05/17/21 1543     Clinical Impression Statement Therapy was able to go through gym equipment today. She tolerated well. We reviewed the different leg press machines. We reviewed the row machine as well. She liked all the machines she tried. We will continue to preview the  different exercises that she can do in the gym. We will progress as tolerated. Per visual inspection her knee is the straightest it has been.    Personal Factors and Comorbidities Fitness;Time since onset of injury/illness/exacerbation;Comorbidity 3+    Comorbidities multiple pain locations, back, hip, knee, Decreased mobility post covid and hospitalization last year,    Examination-Activity Limitations Stand;Locomotion Level;Bend;Dressing;Squat;Stairs    Examination-Participation Restrictions Cleaning;Meal Prep;Yard Work;Community Activity;Shop;Laundry    Stability/Clinical Decision Making Evolving/Moderate complexity    Clinical Decision Making Moderate    Rehab Potential Good    PT Frequency 2x / week    PT Duration 6 weeks    PT Treatment/Interventions ADLs/Self Care Home Management;Cryotherapy;Electrical Stimulation;Iontophoresis 8m/ml Dexamethasone;Ultrasound;DME Instruction;Gait training;Stair training;Therapeutic activities;Therapeutic exercise;Neuromuscular re-education;Patient/family education;Manual techniques;Passive range of motion;Dry needling;Taping    PT Next Visit Plan advance strengtheing and aerobic capacity    PT Home Exercise [email or web address] URL: [email or web address]  Date: 09/06/2020  Prepared by: DCarolyne Littles   Exercises  Seated Knee Extension with Resistance - 1 x daily - 7 x weekly - 3 sets - 10 reps  Seated March with Resistance - 1 x daily - 7 x weekly - 3 sets - 10 reps  Seated Hip Abduction with Resistance - 1 x daily - 7 x weekly - 3 sets - 10 reps    Consulted and Agree with Plan of Care Patient             Patient will benefit from skilled therapeutic intervention in order to improve the following deficits and impairments:  Abnormal gait, Pain, Increased muscle spasms, Decreased activity tolerance, Decreased endurance, Decreased range of motion, Decreased strength, Impaired flexibility, Difficulty walking, Decreased balance, Decreased  safety awareness, Decreased knowledge of precautions  Visit Diagnosis: Other abnormalities of gait and mobility  Unsteadiness on feet  Muscle weakness (generalized)  Pain in left hip  Stiffness of left hip, not elsewhere classified     Problem List Patient Active Problem List   Diagnosis Date Noted   Osteoarthritis, knee 09/24/2019   CKD (chronic kidney disease) stage 4, GFR 15-29 ml/min ([place] 09/24/2019   Severe recurrent major depression without psychotic features ([place] 09/09/2019   Bipolar I disorder, most recent episode depressed ([place]    Transaminitis    Essential hypertension    Encephalopathy 04/26/2019   Leukocytosis 04/21/2019   Thrombocytopenia ([place] 09/04/2018   Vitamin D deficiency 09/04/2018   Psoriatic arthritis ([place] 09/04/2018   Tubular adenoma of colon 05/28/2017   History of colonic polyps 05/28/2017   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 [address]Phone: [phone]  Fax:  [phone] Name: NSHAKENYA STONEBERGMRN: [id number] of Birth: 7July 17, 1952

## 2021-05-18 ENCOUNTER — Other Ambulatory Visit: Payer: Self-pay

## 2021-05-18 ENCOUNTER — Ambulatory Visit (HOSPITAL_BASED_OUTPATIENT_CLINIC_OR_DEPARTMENT_OTHER): Payer: Medicare Other | Admitting: Physical Therapy

## 2021-05-18 DIAGNOSIS — R2681 Unsteadiness on feet: Secondary | ICD-10-CM

## 2021-05-18 DIAGNOSIS — M6281 Muscle weakness (generalized): Secondary | ICD-10-CM

## 2021-05-18 DIAGNOSIS — R2689 Other abnormalities of gait and mobility: Secondary | ICD-10-CM | POA: Diagnosis not present

## 2021-05-18 NOTE — Therapy (Signed)
[place], [name], [address] Phone: [phone]   Fax:  [phone]  Physical Therapy Treatment  Patient Details  Name: Tiffany [name] MRN: [id number] Date of Birth: 1950/05/20 Referring Provider (PT): Inda Coke, [place]   Encounter Date: 05/18/2021   PT End of Session - 05/18/21 1126     Visit Number 25    Number of Visits 30    Date for PT Re-Evaluation 05/30/21    Authorization Type progress note performed on visit 18 Next on 28    PT Start Time 1116    PT Stop Time 1200    PT Time Calculation (min) 44 min    Activity Tolerance Patient tolerated treatment well    Behavior During Therapy [facility] for tasks assessed/performed             Past Medical History:  Diagnosis Date   Bipolar 1 disorder ([place])    Breast cancer ([place])    Chronic diarrhea    loose stools twice a day on average for years   Chronic kidney disease (CKD), stage III (moderate) ([place])    Depression 1987   Hospitalization or health care facility admission within last 6 months 04/2019   for fall/seizures   Malignant neoplasm of overlapping sites of left breast in female, estrogen receptor positive ([place]) 03/14/2016   Dx in 09/2014, s/p bilateral mastectomies and ALND, 0/10 LN. 1.4 cm  Grade I invasive lobular, ER and PR +/Her--, Ki 67 <5% Tried anastrozole for one month, but developed suicidal idea   Osteoarthritis, knee 09/24/2019   Xray 09/2019   Parkinson's disease ([place]) 2012   Psoriatic arthritis ([place])    PTSD (post-traumatic stress disorder)    Secondary hyperparathyroidism ([place])    Tardive dyskinesia     Past Surgical History:  Procedure Laterality Date   [place] Bilateral 09/19/2014   TONSILLECTOMY  1970   URETERAL REIMPLANTION Bilateral 1974    There were no vitals filed for this visit.   Subjective Assessment -  05/18/21 1133     Subjective NO pain in back or hips today/now.  I have pain from my neck to my feet alot of mornings at ~ 4am.  I have to get up take tylenol and a hot shower then it is better 8/10.  I did have 5 nights last week which I didn't have that pain."    Currently in Pain? No/denies    Multiple Pain Sites No               Pt seen for aquatic therapy today.  Treatment took place in water 3.25-3.6 ft in depth at the [employer] pool. Temp of water was 96.  Pt entered/exited the pool via stairs  combination of step to/through pattern independently with bilat rail.     Walking for warm up: Cues for abdominal bracing throughout Pt amb fwd, back and side stepping holding yellow noodle with distant supervision 6 widths of each.  -progressed to 1 foam hand buoys submerged for added core engagement forward x 4 widths -side stepping lunges using hand buoys for shoulder add/abd x 4 widths.   Seated STS from 4th step(bottom) x 10 without ue assist (some unsteadiness) then x 5 with ue pushing up from step.   -from 3rd step (bottom) without ue assist cue for attention to immediate standing balance,  Standing Exercises holding to wall: -hip flex; add/abd; extension; marching 2x10 Step ups leading R/L x10 decreasing ue support to without  Pt requires buoyancy for support and to offload joints with strengthening exercises. Viscosity of the water is needed for resistance of strengthening; water current perturbations provides challenge to standing balance unsupported, requiring increased core activation.                              PT Short Term Goals - 04/19/21 1508       PT SHORT TERM GOAL #1   Title Pt will improve tug with rollater below 14 secs to show improvement in ambulation.    Baseline 14 seconds 12/28    Time 3    Period Weeks    Status Achieved    Target Date 05/17/21      PT SHORT TERM GOAL #2   Title Patient will improve  strength for bilateral hip flexion to 5/5.    Baseline tested with hand dyno today improving in most plains    Time 3    Period Weeks    Status Partially Met    Target Date 05/17/21      PT SHORT TERM GOAL #3   Title Patient will performed 5x sit to stand below 10  secs to show improvement is functional strenght.    Baseline 11    Time 4    Period Weeks    Status On-going    Target Date 05/17/21      PT SHORT TERM GOAL #4   Title Pt will improve 5x sit<>stand to less than or equal to 18 seconds for improved functional strength.    Baseline 16.93 sec 06/30/2019    Time 4    Period Weeks    Status On-going      PT SHORT TERM GOAL #5   Title Pt/husband will verbalize understanding of fall prevention in home environment.    Time 4    Period Weeks    Status On-going    Target Date 05/17/21               PT Long Term Goals - 04/19/21 1509       PT LONG TERM GOAL #1   Title Pt will be be able to perform six minute walk test and ambulate 700 feet without a device or with a less restricted device such as a cane independently in order to ambualte in the community    Baseline qill test next visit    Time 8    Period Weeks    Status On-going    Target Date 06/14/21      PT LONG TERM GOAL #2   Title Pt will go up and down 6 steps without increased pain    Baseline qoerking on steps    Time 6    Period Weeks    Status On-going    Target Date 11/01/20      PT LONG TERM GOAL #3   Title Patient will be independent with pool program and general strengthening program in order to continue to progress mobility    Baseline working on her pool program    Time 6    Period Weeks    Status On-going    Target Date 11/01/20                   Plan - 05/18/21 1137     Clinical  Impression Statement Pt with improving strength as demonstrated by ability to complete STS transfer sitting on 4th  step (from bottom, less submerged) without ue support x 5.  She is directed  through laminated HEP she recieved last visit to ensure understanding, proper execution and to modify as needed.  Pts membership here will begin in 2 weeks. Pt intent is to complete aqutic HEP when she visits.  She amb submerged today without assist of therapist, just noodle with confidence. Focus today is on core strength with balance.  She is indep with immeidate standing balance STS completes without LOB today.  Pt taken into lap pool to instruct on safety and completion of exercises for going forward.    Personal Factors and Comorbidities Fitness;Time since onset of injury/illness/exacerbation;Comorbidity 3+    Examination-Activity Limitations Stand;Locomotion Level;Bend;Dressing;Squat;Stairs    Examination-Participation Restrictions Cleaning;Meal Prep;Yard Work;Community Activity;Shop;Laundry    Stability/Clinical Decision Making Evolving/Moderate complexity    Clinical Decision Making Moderate    Rehab Potential Good    PT Frequency 2x / week    PT Treatment/Interventions ADLs/Self Care Home Management;Cryotherapy;Electrical Stimulation;Iontophoresis 26m/ml Dexamethasone;Ultrasound;DME Instruction;Gait training;Stair training;Therapeutic activities;Therapeutic exercise;Neuromuscular re-education;Patient/family education;Manual techniques;Passive range of motion;Dry needling;Taping    PT Next Visit Plan advance strengtheing and aerobic capacity    PT Home Exercise Plan Access Code: [email or web address] URL: [email or web address]  Date: 09/06/2020  Prepared by: DCarolyne Littles   Exercises  Seated Knee Extension with Resistance - 1 x daily - 7 x weekly - 3 sets - 10 reps  Seated March with Resistance - 1 x daily - 7 x weekly - 3 sets - 10 reps  Seated Hip Abduction with Resistance - 1 x daily - 7 x weekly - 3 sets - 10 reps              Patient will benefit from skilled therapeutic intervention in order to improve the following deficits and impairments:  Abnormal gait, Pain, Increased muscle  spasms, Decreased activity tolerance, Decreased endurance, Decreased range of motion, Decreased strength, Impaired flexibility, Difficulty walking, Decreased balance, Decreased safety awareness, Decreased knowledge of precautions  Visit Diagnosis: Other abnormalities of gait and mobility  Unsteadiness on feet  Muscle weakness (generalized)     Problem List Patient Active Problem List   Diagnosis Date Noted   Osteoarthritis, knee 09/24/2019   CKD (chronic kidney disease) stage 4, GFR 15-29 ml/min ([place] 09/24/2019   Severe recurrent major depression without psychotic features ([place] 09/09/2019   Bipolar I disorder, most recent episode depressed ([place]    Transaminitis    Essential hypertension    Encephalopathy 04/26/2019   Leukocytosis 04/21/2019   Thrombocytopenia ([place] 09/04/2018   Vitamin D deficiency 09/04/2018   Psoriatic arthritis ([place] 09/04/2018   Tubular adenoma of colon 05/28/2017   History of colonic polyps 05/28/2017   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 09/12/2016   Malignant neoplasm of overlapping sites of left breast in female, estrogen receptor positive ([place] 03/14/2016   Chronic kidney disease, stage III (moderate) 01/19/2016   Tardive dyskinesia 10/18/2015   History of breast cancer left 2016 12/27/2014   Osteopenia determined by x-ray 10/31/2014   Rosacea 05/21/2007   Bipolar affective disorder, mixed ([place] 08/16/2004   Acquired hypothyroidism 04/03/1996    MAnnamarie Major Francyne Arreaga MPT 05/18/2021, 12:11 PM  [facility] [address] [address]Phone: [phone]  Fax:  [phone] Name: Tiffany NICCOLIMRN: [id number] of Birth: 712/19/1952

## 2021-05-23 ENCOUNTER — Ambulatory Visit (HOSPITAL_BASED_OUTPATIENT_CLINIC_OR_DEPARTMENT_OTHER): Payer: Medicare Other | Admitting: Physical Therapy

## 2021-05-23 ENCOUNTER — Other Ambulatory Visit: Payer: Self-pay

## 2021-05-23 ENCOUNTER — Encounter (HOSPITAL_BASED_OUTPATIENT_CLINIC_OR_DEPARTMENT_OTHER): Payer: Self-pay | Admitting: Physical Therapy

## 2021-05-23 DIAGNOSIS — R2689 Other abnormalities of gait and mobility: Secondary | ICD-10-CM | POA: Diagnosis not present

## 2021-05-23 LAB — BASIC METABOLIC PANEL
BUN/Creatinine Ratio: 13 (calc) (ref 6–22)
BUN: 29 mg/dL — ABNORMAL HIGH (ref 7–25)
CO2: 24 mmol/L (ref 20–32)
Calcium: 9.7 mg/dL (ref 8.6–10.4)
Chloride: 111 mmol/L — ABNORMAL HIGH (ref 98–110)
Creat: 2.29 mg/dL — ABNORMAL HIGH (ref 0.60–1.00)
Glucose, Bld: 96 mg/dL (ref 65–99)
Potassium: 4.6 mmol/L (ref 3.5–5.3)
Sodium: 143 mmol/L (ref 135–146)

## 2021-05-23 LAB — TSH: TSH: 0.89 mIU/L (ref 0.40–4.50)

## 2021-05-23 LAB — LITHIUM LEVEL: Lithium Lvl: 0.7 mmol/L (ref 0.6–1.2)

## 2021-05-24 ENCOUNTER — Encounter (HOSPITAL_BASED_OUTPATIENT_CLINIC_OR_DEPARTMENT_OTHER): Payer: Self-pay | Admitting: Physical Therapy

## 2021-05-24 NOTE — Therapy (Signed)
[facility] ?[place], [name], [address] ?Phone: [phone]   Fax:  [phone] ? ?Physical Therapy Treatment ? ?Patient Details  ?Name: Tiffany [name] ?MRN: [id number] ?Date of Birth: December 18, 1950 ?Referring Provider (PT): Inda Coke, [place] ? ? ?Encounter Date: 05/23/2021 ? ? ? ?Past Medical History:  ?Diagnosis Date  ? Bipolar 1 disorder ([place])   ? Breast cancer ([place])   ? Chronic diarrhea   ? loose stools twice a day on average for years  ? Chronic kidney disease (CKD), stage III (moderate) ([facility])   ? Depression 1987  ? Hospitalization or health care facility admission within last 6 months 04/2019  ? for fall/seizures  ? Malignant neoplasm of overlapping sites of left breast in female, estrogen receptor positive ([place]) 03/14/2016  ? Dx in 09/2014, s/p bilateral mastectomies and ALND, 0/10 LN. 1.4 cm  Grade I invasive lobular, ER and PR +/Her--, Ki 67 <5% Tried anastrozole for one month, but developed suicidal idea  ? Osteoarthritis, knee 09/24/2019  ? Xray 09/2019  ? Parkinson's disease ([place]) 2012  ? Psoriatic arthritis ([place])   ? PTSD (post-traumatic stress disorder)   ? Secondary hyperparathyroidism ([place])   ? Tardive dyskinesia   ? ? ?Past Surgical History:  ?Procedure Laterality Date  ? ABDOMINAL HYSTERECTOMY  1987  ? CHOLECYSTECTOMY  1979  ? [place] OF UTERUS  1973  ? MASTECTOMY Bilateral 09/19/2014  ? TONSILLECTOMY  1970  ? URETERAL REIMPLANTION Bilateral 1974  ? ? ?There were no vitals filed for this visit. ? ? Subjective Assessment - 05/23/21 1534   ? ? Subjective The patient has been in signifcant pain since last week. She reports she can isolate the exercise that she flet did it. She felt like the sit to stand exercise in the pool caused her back to flair up. She had back and knee pain for several days. Her knee pain has imporved but her back pain is still significant.   ? Limitations Standing;Walking;House hold activities   ? How long  can you sit comfortably? no issues   ? How long can you stand comfortably? Could be immediate or an hour. For sure after an hour.   ? How long can you walk comfortably? has to use a device for limited distances   ? Patient Stated Goals Getting stronger, improving balance, pain free   ? Currently in Pain? Yes   ? Pain Score 8    ? Pain Location Back   ? Pain Orientation Right   ? Pain Descriptors / Indicators Aching   ? Pain Type Chronic pain   ? Pain Onset More than a month ago   ? Pain Frequency Intermittent   ? Aggravating Factors  exercises the other day   ? Pain Relieving Factors stretching   ? Effect of Pain on Daily Activities pain sleeping   ? Multiple Pain Sites No   ? Pain Onset More than a month ago   ? ?  ?  ? ?  ? ? ? ? ? ? ?Manual: trigger point release to gluteal and Lower back: LAD grade II and III to left and right. Less tolerance on the right because of her knee  ? ?LTR x20  ?Gluteal stretch 3x20 sec hold each leg  ? ?Seated ball stretch fwd 10x10 sec hold  ?Lateral 5x 10 sec hold each side  ?Ball press 3x10  ? ?Quad set 3x10  ?Clam shell yellow 3x10 ? ? ? ? ? ? ? ? ? ? ? ? ? ? ? ? ? ? ? ? ? ? ? ? ? ? ? ?  PT Short Term Goals - 04/19/21 1508   ? ?  ? PT SHORT TERM GOAL #1  ? Title Pt will improve tug with rollater below 14 secs to show improvement in ambulation.   ? Baseline 14 seconds 12/28   ? Time 3   ? Period Weeks   ? Status Achieved   ? Target Date 05/17/21   ?  ? PT SHORT TERM GOAL #2  ? Title Patient will improve strength for bilateral hip flexion to 5/5.   ? Baseline tested with hand dyno today improving in most plains   ? Time 3   ? Period Weeks   ? Status Partially Met   ? Target Date 05/17/21   ?  ? PT SHORT TERM GOAL #3  ? Title Patient will performed 5x sit to stand below 10  secs to show improvement is functional strenght.   ? Baseline 11   ? Time 4   ? Period Weeks   ? Status On-going   ? Target Date 05/17/21   ?  ? PT SHORT TERM GOAL #4  ? Title Pt will improve 5x sit<>stand to less  than or equal to 18 seconds for improved functional strength.   ? Baseline 16.93 sec 06/30/2019   ? Time 4   ? Period Weeks   ? Status On-going   ?  ? PT SHORT TERM GOAL #5  ? Title Pt/husband will verbalize understanding of fall prevention in home environment.   ? Time 4   ? Period Weeks   ? Status On-going   ? Target Date 05/17/21   ? ?  ?  ? ?  ? ? ? ? PT Long Term Goals - 04/19/21 1509   ? ?  ? PT LONG TERM GOAL #1  ? Title Pt will be be able to perform six minute walk test and ambulate 700 feet without a device or with a less restricted device such as a cane independently in order to ambualte in the community   ? Baseline qill test next visit   ? Time 8   ? Period Weeks   ? Status On-going   ? Target Date 06/14/21   ?  ? PT LONG TERM GOAL #2  ? Title Pt will go up and down 6 steps without increased pain   ? Baseline qoerking on steps   ? Time 6   ? Period Weeks   ? Status On-going   ? Target Date 11/01/20   ?  ? PT LONG TERM GOAL #3  ? Title Patient will be independent with pool program and general strengthening program in order to continue to progress mobility   ? Baseline working on her pool program   ? Time 6   ? Period Weeks   ? Status On-going   ? Target Date 11/01/20   ? ?  ?  ? ?  ? ? ? ? ? ? ? ? Plan - 05/24/21 1036   ? ? Clinical Impression Statement Therapy focused mostly on manual therapy. She was discoruaged by her set back, but she was endcouaged that these set backs are normal and she has the tools to work out of them We reviewed her stretches and light exercises. She was standing taller by the end of her treatment. She had a large trigger point in her gluteal and lower back. She was encouraged to owkr on self soft ttisue mobilization and to walk over the next few days.   ? Personal  Factors and Comorbidities Fitness;Time since onset of injury/illness/exacerbation;Comorbidity 3+   ? Comorbidities multiple pain locations, back, hip, knee, Decreased mobility post covid and hospitalization last year,    ? Stability/Clinical Decision Making Evolving/Moderate complexity   ? Clinical Decision Making Moderate   ? Rehab Potential Good   ? PT Frequency 2x / week   ? PT Duration 6 weeks   ? PT Treatment/Interventions ADLs/Self Care Home Management;Cryotherapy;Electrical Stimulation;Iontophoresis 43m/ml Dexamethasone;Ultrasound;DME Instruction;Gait training;Stair training;Therapeutic activities;Therapeutic exercise;Neuromuscular re-education;Patient/family education;Manual techniques;Passive range of motion;Dry needling;Taping   ? PT Next Visit Plan advance strengtheing and aerobic capacity   ? PT Home Exercise Plan [facility] Code: [email or web address] URL: [email or web address]  Date: 09/06/2020  Prepared by: DCarolyne Littles   Exercises  Seated Knee Extension with Resistance - 1 x daily - 7 x weekly - 3 sets - 10 reps  Seated March with Resistance - 1 x daily - 7 x weekly - 3 sets - 10 reps  Seated Hip Abduction with Resistance - 1 x daily - 7 x weekly - 3 sets - 10 reps   ? Consulted and Agree with Plan of Care Patient   ? ?  ?  ? ?  ? ? ?Patient will benefit from skilled therapeutic intervention in order to improve the following deficits and impairments:  Abnormal gait, Pain, Increased muscle spasms, Decreased activity tolerance, Decreased endurance, Decreased range of motion, Decreased strength, Impaired flexibility, Difficulty walking, Decreased balance, Decreased safety awareness, Decreased knowledge of precautions ? ?Visit Diagnosis: ?Other abnormalities of gait and mobility ? ?Unsteadiness on feet ? ?Muscle weakness (generalized) ? ?Pain in left hip ? ?Stiffness of left hip, not elsewhere classified ? ? ? ? ?Problem List ?Patient Active Problem List  ? Diagnosis Date Noted  ? Osteoarthritis, knee 09/24/2019  ? CKD (chronic kidney disease) stage 4, GFR 15-29 ml/min ([facility]) 09/24/2019  ? Severe recurrent major depression without psychotic features ([facility] 09/09/2019  ? Bipolar I disorder, most recent episode depressed  ([facility]   ? Transaminitis   ? Essential hypertension   ? Encephalopathy 04/26/2019  ? Leukocytosis 04/21/2019  ? Thrombocytopenia ([place] 09/04/2018  ? Vitamin D deficiency 09/04/2018  ? Psoriatic arthriti

## 2021-05-25 ENCOUNTER — Ambulatory Visit (HOSPITAL_BASED_OUTPATIENT_CLINIC_OR_DEPARTMENT_OTHER): Payer: Medicare Other | Admitting: Physical Therapy

## 2021-05-25 ENCOUNTER — Encounter (HOSPITAL_BASED_OUTPATIENT_CLINIC_OR_DEPARTMENT_OTHER): Payer: Self-pay | Admitting: Physical Therapy

## 2021-05-25 ENCOUNTER — Other Ambulatory Visit: Payer: Self-pay

## 2021-05-25 ENCOUNTER — Ambulatory Visit (INDEPENDENT_AMBULATORY_CARE_PROVIDER_SITE_OTHER): Payer: Medicare Other | Admitting: Psychiatry

## 2021-05-25 DIAGNOSIS — F411 Generalized anxiety disorder: Secondary | ICD-10-CM

## 2021-05-25 DIAGNOSIS — F4310 Post-traumatic stress disorder, unspecified: Secondary | ICD-10-CM | POA: Diagnosis not present

## 2021-05-25 DIAGNOSIS — Z8669 Personal history of other diseases of the nervous system and sense organs: Secondary | ICD-10-CM

## 2021-05-25 DIAGNOSIS — R2681 Unsteadiness on feet: Secondary | ICD-10-CM

## 2021-05-25 DIAGNOSIS — N184 Chronic kidney disease, stage 4 (severe): Secondary | ICD-10-CM

## 2021-05-25 DIAGNOSIS — F3132 Bipolar disorder, current episode depressed, moderate: Secondary | ICD-10-CM | POA: Diagnosis not present

## 2021-05-25 DIAGNOSIS — M6281 Muscle weakness (generalized): Secondary | ICD-10-CM

## 2021-05-25 DIAGNOSIS — F40232 Fear of other medical care: Secondary | ICD-10-CM

## 2021-05-25 DIAGNOSIS — F4010 Social phobia, unspecified: Secondary | ICD-10-CM

## 2021-05-25 DIAGNOSIS — R2689 Other abnormalities of gait and mobility: Secondary | ICD-10-CM | POA: Diagnosis not present

## 2021-05-25 DIAGNOSIS — M25552 Pain in left hip: Secondary | ICD-10-CM

## 2021-05-25 DIAGNOSIS — M25652 Stiffness of left hip, not elsewhere classified: Secondary | ICD-10-CM

## 2021-05-25 NOTE — Progress Notes (Signed)
Psychotherapy Progress Note ?[facility] ?[name], PhD LP ? ?Patient ID: Tiffany [name])    MRN: [id number] ?Therapy format: Family therapy w/ patient -- accompanied by [name] ?Date: 05/25/2021      Start: 10:16a     Stop: 11:09p     Time Spent: 53 min ?Location: In-person  ? ?Session narrative (presenting needs, interim history, self-report of stressors and symptoms, applications of prior therapy, status changes, and interventions made in session) ?Here with [facility] today.  Feels like "childhood trauma won't let go of me".  Felt acute absence from therapy, journaling noted some agonizing about the 17-day wait she foresaw after last visit.  Got so worked up about whether she would lose her aquatic PT that she cancelled the next session herself -- interpreted as expecting abandonment so taking it into her own hands, temporarily.  Realized soon after that she isn't stifled, she can speak up for herself, rescheduled, went back.  Has in fact had to deal with a change of personnel in aquatic therapy, but feels good about the new female PT Anderson Malta), is getting herself to go, and has now been introduced to the big pool and the kind of independent work she can do.  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  Went through a vigorous test with Tharon Aquas, her usual aqua PT last Friday, after which she endured 4-5 days strong pain.  Monday's rheumatology testing found elevated C-reactive protein, which they eventually understood to be a reaction to the strenuous workout.  At this point, just apprehensive about her competency to use higher-tech workout machines.  Validated her limited comprehension and procedural memory, the need to have a reference, and the need to teach visually, not just verbally.  Agreed to engage [name] and her therapist in creating a guide for herself. ? ?Re.  colonoscopy -- did decide she was willing to do the procedure.  Had resolved not to, politely told her GI she didn't want to, and the GI brusquely asked why she was there.  Said assertively that she was willing to take the risk (of not knowing) -- implication to prevent the anxiety of the procedure -- but after seeing [facility], reversed herself and said she'd be willing to take the risk (of the procedure).  Scheduled 4/3 now.  Managed to get concern on the table how she woke up during her earlier procedure, assured current anesthesia is very smooth and effective, won't recall a thing.  That was reassuring for a day, then she started worrying whether she'd be too sedated to defend herself if doctor tried to abuse her.  Validated she has a dilemma of fears, affirmed good purposes in going ahead with the procedure and how she ultimately wants to know the state of her "innards" enough to hire someone competent to find out, she ultimately trusts her doctor to do just that, and it's important to follow through in trust to enjoy better trust.  To lighten the mood and provide better sense of protection, had her briefly imagine her husband stationed in the [place] with a shotgun.  Also, per her faith, imagined Jesus standing by and letting her know "I have this". ? ?[name] notes a thyroid finding, possibly -- says TSH has been dropping over the past year.  Unclear if this is a fining of concern or normal response to successful thyroid  treatment.  Advised clarify with PCP/endocrinology if concerned.   ? ?Feels like her startle response is getting worse of late.  Startled a fellow patient at a medical office recently for how she startled hearing her name called.  Interpreted as result of both the flow of challenges she's had lately and the probably premature withdrawal of alpha blocker.  According to [name], psychiatry said it was "just" for nightmares, but educated that its purposes are for all notable aspects of PTSD  including NMs, flashbacks, exaggerated startle, and heightened autonomic reactivity, and it may well be that it was helping when it got discontinued, and that her report of it not making any real difference for dreams and insomnia was misrepresenting herself for other purposes (simplify meds? seem compliant with what she believed were doctor's expectations?).  Encouraged to consider and re-evaluate with psychiatry. ? ?Therapeutic modalities: Cognitive Behavioral Therapy, Solution-Oriented/Positive Psychology, and Faith-sensitive ? ?Mental Status/Observations: ? ?Appearance:   Casual and Neat     ?Behavior:  Appropriate  ?Motor:  Normal  ?Speech/Language:   Clear and Coherent  ?Affect:  Appropriate and tearful at moments  ?Mood:  anxious and dysthymic  ?Thought process:  Brief blocking  ?Thought content:    Obsessions  ?Sensory/Perceptual disturbances:    WNL  ?Orientation:  Fully oriented  ?Attention:  Possibly brief  interruptions  ?  ?Concentration:  Good  ?Memory:  grossly intact  ?Insight:    Fair  ?Judgment:   Good  ?Impulse Control:  Good  ? ?Risk Assessment: ?Danger to Self: No Self-injurious Behavior: No ?Danger to Others: No Physical Aggression / Violence: No ?Duty to Warn: No Access to Firearms a concern: No ? ?Assessment of progress:  stabilized ? ?Diagnosis: ?  ICD-10-CM   ?1. PTSD (post-traumatic stress disorder)  F43.10   ?  ?2. Bipolar I disorder, most recent episode depressed, moderate (HCC)  F31.32   ?  ?3. Generalized anxiety disorder  F41.1   ?  ?4. Social anxiety disorder  F40.10   ?  ?5. Fear of other medical care  F40.232   ?  ?6. History of metabolic encephalopathy  S97.02   ?  ?7. History of small, chronic left frontal infarct, as assessed by neurologist  [phone]   ?  ?8. CKD (chronic kidney disease) stage 4, GFR 15-29 ml/min (HCC) by history  N18.4   ?  ? ?Plan:  ?Self-validate enduring gaps in contact and using her assertiveness throughout health care ?Worth reevaluating alpha blocker, which  may have been discontinued prematurely base on partial information ?Self-remind of her good purposes with colonoscopy and use visualizations of protection PRN ?Work with PT and H to make visual guides for the exercise machines she'll need and does not feel fluent with ?Come back to relaxation training for further competency in handling anxiety ?Other recommendations/advice as may be noted above ?Continue to utilize previously learned skills ad lib ?Maintain medication as prescribed and work faithfully with relevant prescriber(s) if any changes are desired or seem indicated ?Call the clinic on-call service, 988/hotline, 911, or present to [facility] or ER if any life-threatening psychiatric crisis ?Return q 1-2 wks, for session(s) already scheduled. ?Already scheduled visit in this office 05/31/2021. ? ?[name], PhD ?[name], PhD LP ?Clinical Psychologist, [facility] ?[facility] ?[address], [address] ?[place], [place] [address] ?(o) [phone] ?

## 2021-05-25 NOTE — Therapy (Addendum)
[facility] ?[place], [name], [address] ?Phone: [phone]   Fax:  [phone] ? ?Physical Therapy Treatment ? ?Patient Details  ?Name: Tiffany [name] ?MRN: [id number] ?Date of Birth: Feb 20, 1951 ?Referring Provider (PT): Inda Coke, [place] ? ? ?Encounter Date: 05/25/2021 ? ? PT End of Session - 05/25/21 1338   ? ? Visit Number 27   ? Number of Visits 30   ? Date for PT Re-Evaluation 05/30/21   ? Authorization Type progress note performed on visit 18 Next on 28   ? PT Start Time 1340   ? PT Stop Time 4081   ? PT Time Calculation (min) 42 min   ? Activity Tolerance Patient tolerated treatment well   ? Behavior During Therapy [facility] for tasks assessed/performed   ? ?  ?  ? ?  ? ? ? ?Past Medical History:  ?Diagnosis Date  ? Bipolar 1 disorder ([place])   ? Breast cancer ([place])   ? Chronic diarrhea   ? loose stools twice a day on average for years  ? Chronic kidney disease (CKD), stage III (moderate) (HCC)   ? Depression 1987  ? Hospitalization or health care facility admission within last 6 months 04/2019  ? for fall/seizures  ? Malignant neoplasm of overlapping sites of left breast in female, estrogen receptor positive ([place]) 03/14/2016  ? Dx in 09/2014, s/p bilateral mastectomies and ALND, 0/10 LN. 1.4 cm  Grade I invasive lobular, ER and PR +/Her--, Ki 67 <5% Tried anastrozole for one month, but developed suicidal idea  ? Osteoarthritis, knee 09/24/2019  ? Xray 09/2019  ? Parkinson's disease ([place]) 2012  ? Psoriatic arthritis ([place])   ? PTSD (post-traumatic stress disorder)   ? Secondary hyperparathyroidism ([place])   ? Tardive dyskinesia   ? ? ?Past Surgical History:  ?Procedure Laterality Date  ? ABDOMINAL HYSTERECTOMY  1987  ? CHOLECYSTECTOMY  1979  ? [place] OF UTERUS  1973  ? MASTECTOMY Bilateral 09/19/2014  ? TONSILLECTOMY  1970  ? URETERAL REIMPLANTION Bilateral 1974  ? ? ?There were no vitals filed for this visit. ?OBJECTIVE: ? ? Subjective  Assessment - 05/25/21 1403   ? ? Subjective Pt reports that the session with Shanon Brow helped; she is pain free today.  She states she felt good doing the exercises during the last aquatic visit, and didn't experience the soreness until 1-2 days later.  She arrived without assistive device.   ? Patient Stated Goals Getting stronger, improving balance, pain free   ? Currently in Pain? No/denies   ? Pain Score 0-No pain   ? ?  ?  ? ?  ?5 x STS: 11.39 seconds with light UE support, from seat by hot tub. ? ? ?   TREATMENT: ? ?Pt seen for aquatic therapy today.  Treatment took place in water 3.25-3.6 ft in depth at the [employer] pool. Temp of water was 92?.  Pt entered/exited the pool via stairs with step to pattern independently with bilat rail. ?  ?  ?Walking for warm up:  ?Pt amb fwd, back and side stepping holding yellow noodle with distant supervision 6 widths of each.  ?Lt SLS with yellow dumbbells with 3 way kicks x 10; Rt SLS with UE on wall, 3 way LE kicks x 10 ?Walking forward/ backward UE on yellow noodle ?Heel/toe raises x 10 UE on wall, squats x 10 ?Walking forward/ backward UE on yellow noodle ?Sit to/from stand on 4th step from bottom  x 5 - no UE ascending/ UE on rails to assist with descending ?Walking forward/ backward UE on yellow noodle ? ?Pt requires buoyancy for support and to offload joints with strengthening exercises. Viscosity of the water is needed for resistance of strengthening; water current perturbations provides challenge to standing balance unsupported, requiring increased core activation. ? ? ? ? PT Short Term Goals - 05/25/21 1354   ? ?  ? PT SHORT TERM GOAL #1  ? Title Pt will improve tug with rollater below 14 secs to show improvement in ambulation.   ? Baseline 14 seconds 12/28   ? Time 3   ? Period Weeks   ? Status Achieved   ? Target Date 05/17/21   ?  ? PT SHORT TERM GOAL #2  ? Title Patient will improve strength for bilateral hip flexion to 5/5.   ? Baseline tested with  hand dyno today improving in most plains   ? Time 3   ? Period Weeks   ? Status Partially Met   ? Target Date 05/17/21   ?  ? PT SHORT TERM GOAL #3  ? Title Patient will performed 5x sit to stand below 10  secs to show improvement is functional strenght.   ? Baseline 11.39 on 05/25/21   ? Time 4   ? Period Weeks   ? Status On-going   ? Target Date 05/17/21   ? ?  ?  ? ?  ? ? ? ? PT Long Term Goals - 05/25/21 1356   ? ?  ? PT LONG TERM GOAL #1  ? Title Pt will be be able to perform six minute walk test and ambulate 700 feet without a device or with a less restricted device such as a cane independently in order to ambualte in the community   ? Baseline will check next land visit   ? Time 8   ? Period Weeks   ? Status On-going   ? Target Date 06/14/21   ?  ? PT LONG TERM GOAL #2  ? Title Pt will go up and down 6 steps without increased pain   ? Baseline can go up steps without increase; pain with descending with LLE   ? Time 6   ? Period Weeks   ? Status Partially Met   ? Target Date 06/14/21   ?  ? PT LONG TERM GOAL #3  ? Title Patient will be independent with pool program and general strengthening program in order to continue to progress mobility   ? Baseline working on her pool program   ? Time 6   ? Period Weeks   ? Status Partially Met   ? Target Date 06/14/21   ? ?  ?  ? ?  ? ? ? ? ? ? ? ? Plan - 05/25/21 1413   ? ? Clinical Impression Statement 5x STS was 11.39 sec; close to meeting STG3.  She requires UE support on wall for exercises in SLS on RLE, otherwise progressed to hand buoys with L SLS exercise.  No increase in pain during session. Encouraged pt to conservatively progress exercises as to not have a flare up in pain. She was happy with the amount of exercise completed today and she will assess response and adjust HEP accordingly. WC assist to gym entry for safety since she did not bring her walker.   ? Personal Factors and Comorbidities Fitness;Time since onset of injury/illness/exacerbation;Comorbidity  3+   ? Comorbidities multiple pain locations,  back, hip, knee, Decreased mobility post covid and hospitalization last year,   ? Stability/Clinical Decision Making Evolving/Moderate complexity   ? Rehab Potential Good   ? PT Frequency 2x / week   ? PT Duration 6 weeks   ? PT Treatment/Interventions ADLs/Self Care Home Management;Cryotherapy;Electrical Stimulation;Iontophoresis 36m/ml Dexamethasone;Ultrasound;DME Instruction;Gait training;Stair training;Therapeutic activities;Therapeutic exercise;Neuromuscular re-education;Patient/family education;Manual techniques;Passive range of motion;Dry needling;Taping   ? PT Next Visit Plan advance strengtheing and aerobic capacity.  28th visit - Progress note.   ? PT Home Exercise Plan [email or web address] URL: [email or web address]  Date: 09/06/2020  Prepared by: DCarolyne Littles   Exercises  Seated Knee Extension with Resistance - 1 x daily - 7 x weekly - 3 sets - 10 reps  Seated March with Resistance - 1 x daily - 7 x weekly - 3 sets - 10 reps  Seated Hip Abduction with Resistance - 1 x daily - 7 x weekly - 3 sets - 10 reps   ? Consulted and Agree with Plan of Care Patient   ? ?  ?  ? ?  ? ? ? ? ?Patient will benefit from skilled therapeutic intervention in order to improve the following deficits and impairments:  Abnormal gait, Pain, Increased muscle spasms, Decreased activity tolerance, Decreased endurance, Decreased range of motion, Decreased strength, Impaired flexibility, Difficulty walking, Decreased balance, Decreased safety awareness, Decreased knowledge of precautions ? ?Visit Diagnosis: ?Other abnormalities of gait and mobility ? ?Unsteadiness on feet ? ?Muscle weakness (generalized) ? ?Pain in left hip ? ?Stiffness of left hip, not elsewhere classified ? ? ? ? ?Problem List ?Patient Active Problem List  ? Diagnosis Date Noted  ? Osteoarthritis, knee 09/24/2019  ? CKD (chronic kidney disease) stage 4, GFR 15-29 ml/min (HCC) 09/24/2019  ? Severe  recurrent major depression without psychotic features ([place] 09/09/2019  ? Bipolar I disorder, most recent episode depressed ([place]   ? Transaminitis   ? Essential hypertension   ? Encephalopathy 04/26/2019  ? Leukoc

## 2021-05-27 ENCOUNTER — Encounter: Payer: Self-pay | Admitting: Physician Assistant

## 2021-05-28 ENCOUNTER — Other Ambulatory Visit: Payer: Self-pay

## 2021-05-28 ENCOUNTER — Encounter (HOSPITAL_BASED_OUTPATIENT_CLINIC_OR_DEPARTMENT_OTHER): Payer: Self-pay | Admitting: Physical Therapy

## 2021-05-28 ENCOUNTER — Ambulatory Visit: Payer: Medicare Other | Admitting: *Deleted

## 2021-05-28 ENCOUNTER — Ambulatory Visit (HOSPITAL_BASED_OUTPATIENT_CLINIC_OR_DEPARTMENT_OTHER): Payer: Medicare Other | Admitting: Physical Therapy

## 2021-05-28 VITALS — Ht 64.0 in | Wt 200.0 lb

## 2021-05-28 DIAGNOSIS — R2689 Other abnormalities of gait and mobility: Secondary | ICD-10-CM

## 2021-05-28 DIAGNOSIS — R2681 Unsteadiness on feet: Secondary | ICD-10-CM

## 2021-05-28 DIAGNOSIS — M25552 Pain in left hip: Secondary | ICD-10-CM

## 2021-05-28 DIAGNOSIS — M6281 Muscle weakness (generalized): Secondary | ICD-10-CM

## 2021-05-28 DIAGNOSIS — R197 Diarrhea, unspecified: Secondary | ICD-10-CM

## 2021-05-28 DIAGNOSIS — Z86010 Personal history of colonic polyps: Secondary | ICD-10-CM

## 2021-05-28 DIAGNOSIS — M25652 Stiffness of left hip, not elsewhere classified: Secondary | ICD-10-CM

## 2021-05-28 NOTE — Progress Notes (Signed)
No egg or soy allergy known to patient  °No issues known to pt with past sedation with any surgeries or procedures °Patient denies ever being told they had issues or difficulty with intubation  °No FH of Malignant Hyperthermia °Pt is not on diet pills °Pt is not on  home 02  °Pt is not on blood thinners  °Pt denies issues with constipation  °No A fib or A flutter ° °Pt is fully vaccinated  for Covid  ° °Due to the COVID-19 pandemic we are asking patients to follow certain guidelines in PV and the [facility]   °Pt aware of COVID protocols and [facility] guidelines  ° °PV completed over the phone. Pt verified name, DOB, address and insurance during PV today.  °Pt mailed instruction packet with copy of consent form to read and not return, and instructions.  °Pt encouraged to call with questions or issues.  °If pt has My chart, procedure instructions sent via My Chart  ° °

## 2021-05-28 NOTE — Therapy (Addendum)
[place] [facility] [facility] ?[place], [name], [address] ?Phone: [phone]   Fax:  [phone] ? ?Physical Therapy Treatment/Progress ? ?Patient Details  ?Name: Tiffany [name] ?MRN: [id number] ?Date of Birth: Nov 26, 1950 ?Referring Provider (PT): Inda Coke, [place] ? ?Progress Note ?Reporting Period 04/18/2021 to 05/28/2021 ? ?See note below for Objective Data and Assessment of Progress/Goals.  ? ?  ?Encounter Date: 05/28/2021 ? ? PT End of Session - 05/28/21 1327   ? ? Visit Number 28   ? Number of Visits 30   ? Date for PT Re-Evaluation 05/30/21   ? Authorization Type progress note performed on visit 18 Next on 28   ? PT Start Time 1300   ? PT Stop Time 1342   ? PT Time Calculation (min) 42 min   ? Activity Tolerance Patient tolerated treatment well   ? Behavior During Therapy [facility] for tasks assessed/performed   ? ?  ?  ? ?  ? ? ?Past Medical History:  ?Diagnosis Date  ? Bipolar 1 disorder ([place])   ? Breast cancer ([place])   ? Chronic diarrhea   ? loose stools twice a day on average for years  ? Chronic kidney disease (CKD), stage III (moderate) (HCC)   ? Depression 1987  ? Hospitalization or health care facility admission within last 6 months 04/2019  ? for fall/seizures  ? Malignant neoplasm of overlapping sites of left breast in female, estrogen receptor positive ([place]) 03/14/2016  ? Dx in 09/2014, s/p bilateral mastectomies and ALND, 0/10 LN. 1.4 cm  Grade I invasive lobular, ER and PR +/Her--, Ki 67 <5% Tried anastrozole for one month, but developed suicidal idea  ? Osteoarthritis, knee 09/24/2019  ? Xray 09/2019  ? Parkinson's disease ([place]) 2012  ? Psoriatic arthritis ([place])   ? PTSD (post-traumatic stress disorder)   ? Secondary hyperparathyroidism ([place])   ? Tardive dyskinesia   ? ? ?Past Surgical History:  ?Procedure Laterality Date  ? ABDOMINAL HYSTERECTOMY  1987  ? CHOLECYSTECTOMY  1979  ? [place] OF UTERUS  1973  ? MASTECTOMY Bilateral 09/19/2014  ?  TONSILLECTOMY  1970  ? URETERAL REIMPLANTION Bilateral 1974  ? ? ?There were no vitals filed for this visit. ? ? Subjective Assessment - 05/28/21 1322   ? ? Subjective The patient did very well with th epool on Friday. Her pain has resolved for the most part. She did a lot of walking over the weekend and had a small amount of lateral knee pain.   ? Limitations Standing;Walking;House hold activities   ? How long can you sit comfortably? no issues   ? How long can you stand comfortably? Could be immediate or an hour. For sure after an hour.   ? How long can you walk comfortably? has to use a device for limited distances   ? Patient Stated Goals Getting stronger, improving balance, pain free   ? Currently in Pain? No/denies   ? ?  ?  ? ?  ? ? ? ? ?Cable row 5 lbs 3x10  ?Cable extension 3x10 5 lbs  ?Hip abduction 30 lbs 3x10  ? ?Manual: knee extension stretching; lateral knee and leg STM;  ? ?Nu-step x 5 min  ? ?Reviewed the set up of the machines and how to get on and off of them.  ? ? ? ? ? ? ? ?    ?  AROM  ?  Overall AROM Comments right knee flexion  7 -110  ?     ?  Strength  ?  Right Hip Flexion --   25.8   ?  Right Hip ABduction --   29.2  ?  Left Hip Flexion --   20.1  ?  Left Hip ABduction --   28.6  ?  Right Knee Extension --   26.5  ?  Left Knee Extension --   31.7  ?     ?  Transfers  ?  Five time sit to stand comments  10   sec   ?  ?   ? ? ? ? ? ? ? ? ? ? ? ? ? ? ? ? ? ? PT Education - 05/28/21 1326   ? ? Education Details reviewed more gym equipment  and set up.   ? Person(s) Educated Patient   ? Methods Explanation;Demonstration;Tactile cues;Verbal cues   ? Comprehension Verbalized understanding;Returned demonstration;Verbal cues required;Tactile cues required   ? ?  ?  ? ?  ? ? ? PT Short Term Goals - 05/25/21 1354   ? ?  ? PT SHORT TERM GOAL #1  ? Title Pt will improve tug with rollater below 14 secs to show improvement in ambulation.   ? Baseline 14 seconds 12/28   ? Time 3   ? Period Weeks   ? Status  Achieved   ? Target Date 05/17/21   ?  ? PT SHORT TERM GOAL #2  ? Title Patient will improve strength for bilateral hip flexion to 5/5.   ? Baseline tested with hand dyno today improving in most plains   ? Time 3   ? Period Weeks   ? Status Partially Met   ? Target Date 05/17/21   ?  ? PT SHORT TERM GOAL #3  ? Title Patient will performed 5x sit to stand below 10  secs to show improvement is functional strenght.   ? Baseline 11.39 on 05/25/21   ? Time 4   ? Period Weeks   ? Status On-going   ? Target Date 05/17/21   ? ?  ?  ? ?  ? ? ? ? PT Long Term Goals - 05/25/21 1356   ? ?  ? PT LONG TERM GOAL #1  ? Title Pt will be be able to perform six minute walk test and ambulate 700 feet without a device or with a less restricted device such as a cane independently in order to ambualte in the community   ? Baseline will check next land visit   ? Time 8   ? Period Weeks   ? Status On-going   ? Target Date 06/14/21   ?  ? PT LONG TERM GOAL #2  ? Title Pt will go up and down 6 steps without increased pain   ? Baseline can go up steps without increase; pain with descending with LLE   ? Time 6   ? Period Weeks   ? Status Partially Met   ? Target Date 06/14/21   ?  ? PT LONG TERM GOAL #3  ? Title Patient will be independent with pool program and general strengthening program in order to continue to progress mobility   ? Baseline working on her pool program   ? Time 6   ? Period Weeks   ? Status Partially Met   ? Target Date 06/14/21   ? ?  ?  ? ?  ? ? ? ? ? ? ? ? Plan - 05/28/21 1334   ? ? Clinical Impression Statement The  patient did much better then her last gym visit. Her knee extension is progressing well. She was able to do cable exercises in the gym. She also was able to do the hip abudction machine. She was unable to get on the hip adduction machine. Overall she is progressing well. We will continue to review gym exercises with her. She plans on shceudling her fitness assessment soon. Patient will likely discharge next  visit.   ? Personal Factors and Comorbidities Fitness;Time since onset of injury/illness/exacerbation;Comorbidity 3+   ? Comorbidities multiple pain locations, back, hip, knee, Decreased mobility post covid and hospitalization last year,   ? Examination-Activity Limitations Stand;Locomotion Level;Bend;Dressing;Squat;Stairs   ? Examination-Participation Restrictions Cleaning;Meal Prep;Yard Work;Community Activity;Shop;Laundry   ? Stability/Clinical Decision Making Evolving/Moderate complexity   ? Clinical Decision Making Moderate   ? Rehab Potential Good   ? PT Frequency 2x / week   ? PT Duration 6 weeks   ? PT Treatment/Interventions ADLs/Self Care Home Management;Cryotherapy;Electrical Stimulation;Iontophoresis 59m/ml Dexamethasone;Ultrasound;DME Instruction;Gait training;Stair training;Therapeutic activities;Therapeutic exercise;Neuromuscular re-education;Patient/family education;Manual techniques;Passive range of motion;Dry needling;Taping   ? ?  ?  ? ?  ? ? ?Patient will benefit from skilled therapeutic intervention in order to improve the following deficits and impairments:  Abnormal gait, Pain, Increased muscle spasms, Decreased activity tolerance, Decreased endurance, Decreased range of motion, Decreased strength, Impaired flexibility, Difficulty walking, Decreased balance, Decreased safety awareness, Decreased knowledge of precautions ? ?Visit Diagnosis: ?Other abnormalities of gait and mobility ? ?Unsteadiness on feet ? ?Muscle weakness (generalized) ? ?Pain in left hip ? ?Stiffness of left hip, not elsewhere classified ? ? ? ? ?Problem List ?Patient Active Problem List  ? Diagnosis Date Noted  ? Osteoarthritis, knee 09/24/2019  ? CKD (chronic kidney disease) stage 4, GFR 15-29 ml/min (HCC) 09/24/2019  ? Severe recurrent major depression without psychotic features ([place] 09/09/2019  ? Bipolar I disorder, most recent episode depressed ([place]   ? Transaminitis   ? Essential hypertension   ? Encephalopathy  04/26/2019  ? Leukocytosis 04/21/2019  ? Thrombocytopenia ([facility] [place] 09/04/2018  ? Vitamin D deficiency 09/04/2018  ? Psoriatic arthritis ([facility] 09/04/2018  ? Tubular adenoma of colon 05/28/2017  ? History of colonic

## 2021-05-30 ENCOUNTER — Ambulatory Visit (INDEPENDENT_AMBULATORY_CARE_PROVIDER_SITE_OTHER): Payer: Medicare Other | Admitting: Podiatry

## 2021-05-30 ENCOUNTER — Other Ambulatory Visit: Payer: Self-pay

## 2021-05-30 DIAGNOSIS — M2042 Other hammer toe(s) (acquired), left foot: Secondary | ICD-10-CM | POA: Diagnosis not present

## 2021-05-30 DIAGNOSIS — L989 Disorder of the skin and subcutaneous tissue, unspecified: Secondary | ICD-10-CM | POA: Diagnosis not present

## 2021-05-30 NOTE — Progress Notes (Signed)
? ?  HPI: 71 y.o. female presenting today for evaluation of a symptomatic callus to the plantar aspect of the second MTP left foot.  She says that it is very painful to walk on.  They have been applying corn and callus remover to the area which has helped minimally.  She also has a hammertoe deformity to the second digit and would like to discuss the hammertoe as well. ? ?Past Medical History:  ?Diagnosis Date  ? Bipolar 1 disorder ([place])   ? Breast cancer ([place])   ? Chronic diarrhea   ? loose stools twice a day on average for years  ? Chronic kidney disease (CKD), stage III (moderate) (HCC)   ? Depression 1987  ? Hospitalization or health care facility admission within last 6 months 04/2019  ? for fall/seizures  ? Malignant neoplasm of overlapping sites of left breast in female, estrogen receptor positive ([place]) 03/14/2016  ? Dx in 09/2014, s/p bilateral mastectomies and ALND, 0/10 LN. 1.4 cm  Grade I invasive lobular, ER and PR +/Her--, Ki 67 <5% Tried anastrozole for one month, but developed suicidal idea  ? Osteoarthritis, knee 09/24/2019  ? Xray 09/2019  ? Parkinson's disease ([place]) 2012  ? Psoriatic arthritis ([place])   ? PTSD (post-traumatic stress disorder)   ? Secondary hyperparathyroidism ([place])   ? Tardive dyskinesia   ? Thyroid disease   ? ?  ?Physical Exam: ?General: The patient is alert and oriented x3 in no acute distress. ? ?Dermatology: Skin is warm, dry and supple bilateral lower extremities. Negative for open lesions or macerations.  Hyperkeratotic preulcerative callus tissue noted at the plantar aspect of the second MTP left.  Hammertoe contracture also noted which is contributory to the callus likely rubbing from shoe gear ? ?Vascular: Palpable pedal pulses bilaterally. No edema or erythema noted. Capillary refill within normal limits. ? ?Neurological: Epicritic and protective threshold grossly intact bilaterally.  ? ?Musculoskeletal Exam: Range of motion within normal limits to all pedal and ankle joints  bilateral. Muscle strength 5/5 in all groups bilateral.  Rigid hammertoe contracture noted second digits bilateral left greater than right ? ?Assessment: ?1.  Hammertoe contracture second digit bilateral left greater than right ?2.  Preulcerative callus/porokeratosis plantar second MTP left ? ? ?Plan of Care:  ?1. Patient evaluated.  ?2.  Excisional debridement of the callus tissue was performed using a chisel blade and tissue nipper without incident or bleeding.   ?3.  Recommend wearing good supportive shoes that do not irritate her of the hammertoe deformity.  Advised against going barefoot ?4.  Revitaderm urea 40% lotion provided at checkout to apply daily ?5.  We will continue to pursue conservative treatment and management of the hammertoe deformity unless it becomes increasingly symptomatic and painful.  Surgery was discussed however we are not going to pursue this option for now ?6.  Return to clinic as needed ? ? ?  ?  ?Edrick Kins, DPM ?[place] ? ?Dr. Edrick Kins, DPM  ?  ?[address]                                        ?[place], [place] [address]                ?Office [phone]  ?Fax [phone] ? ? ? ? ?

## 2021-05-31 ENCOUNTER — Other Ambulatory Visit: Payer: Self-pay | Admitting: Physician Assistant

## 2021-05-31 ENCOUNTER — Ambulatory Visit (INDEPENDENT_AMBULATORY_CARE_PROVIDER_SITE_OTHER): Payer: Medicare Other | Admitting: Psychiatry

## 2021-05-31 DIAGNOSIS — F4010 Social phobia, unspecified: Secondary | ICD-10-CM | POA: Diagnosis not present

## 2021-05-31 DIAGNOSIS — F3132 Bipolar disorder, current episode depressed, moderate: Secondary | ICD-10-CM | POA: Diagnosis not present

## 2021-05-31 DIAGNOSIS — F4310 Post-traumatic stress disorder, unspecified: Secondary | ICD-10-CM | POA: Diagnosis not present

## 2021-05-31 DIAGNOSIS — F411 Generalized anxiety disorder: Secondary | ICD-10-CM

## 2021-05-31 DIAGNOSIS — Z8669 Personal history of other diseases of the nervous system and sense organs: Secondary | ICD-10-CM
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
## 2021-06-01 ENCOUNTER — Ambulatory Visit (HOSPITAL_BASED_OUTPATIENT_CLINIC_OR_DEPARTMENT_OTHER): Payer: Self-pay | Admitting: Physical Therapy

## 2021-06-01 ENCOUNTER — Encounter: Payer: Self-pay | Admitting: Genetic Counselor

## 2021-06-01 ENCOUNTER — Other Ambulatory Visit: Payer: Self-pay

## 2021-06-01 DIAGNOSIS — E039 Hypothyroidism, unspecified: Secondary | ICD-10-CM

## 2021-06-04 ENCOUNTER — Ambulatory Visit (HOSPITAL_BASED_OUTPATIENT_CLINIC_OR_DEPARTMENT_OTHER): Payer: Medicare Other | Admitting: Physical Therapy

## 2021-06-04 ENCOUNTER — Encounter (HOSPITAL_BASED_OUTPATIENT_CLINIC_OR_DEPARTMENT_OTHER): Payer: Self-pay | Admitting: Physical Therapy

## 2021-06-04 ENCOUNTER — Other Ambulatory Visit: Payer: Self-pay

## 2021-06-04 DIAGNOSIS — R2681 Unsteadiness on feet: Secondary | ICD-10-CM

## 2021-06-04 DIAGNOSIS — M25652 Stiffness of left hip, not elsewhere classified: Secondary | ICD-10-CM

## 2021-06-04 DIAGNOSIS — R2689 Other abnormalities of gait and mobility: Secondary | ICD-10-CM

## 2021-06-04 DIAGNOSIS — M6281 Muscle weakness (generalized): Secondary | ICD-10-CM

## 2021-06-04 DIAGNOSIS — M25552 Pain in left hip: Secondary | ICD-10-CM

## 2021-06-05 ENCOUNTER — Inpatient Hospital Stay: Payer: Medicare Other | Attending: Genetic Counselor | Admitting: Genetic Counselor

## 2021-06-05 ENCOUNTER — Inpatient Hospital Stay: Payer: Medicare Other

## 2021-06-05 ENCOUNTER — Other Ambulatory Visit: Payer: Self-pay | Admitting: Genetic Counselor

## 2021-06-05 ENCOUNTER — Encounter: Payer: Self-pay | Admitting: Genetic Counselor

## 2021-06-05 DIAGNOSIS — Z86010 Personal history of colonic polyps: Secondary | ICD-10-CM | POA: Diagnosis not present

## 2021-06-05 DIAGNOSIS — C50812 Malignant neoplasm of overlapping sites of left female breast: Secondary | ICD-10-CM

## 2021-06-05 DIAGNOSIS — Z853 Personal history of malignant neoplasm of breast: Secondary | ICD-10-CM

## 2021-06-05 DIAGNOSIS — Z803 Family history of malignant neoplasm of breast: Secondary | ICD-10-CM | POA: Diagnosis not present

## 2021-06-05 LAB — GENETIC SCREENING ORDER

## 2021-06-05 NOTE — Therapy (Signed)
[facility] ?[place], [name], [address] ?Phone: [phone]   Fax:  [phone] ? ?Physical Therapy Treatment/Progress Note  ?Discharge  ? ? ?Patient Details  ?Name: Tiffany [name] ?MRN: [id number] ?Date of Birth: Jul 13, 1950 ?Referring Provider (PT): Inda Coke, [place] ?Progress Note ?Reporting Period  04/18/2021 to 06/04/2021 ? ?Encounter Date: 06/04/2021 ? ? PT End of Session - 06/04/21 1150   ? ? Visit Number 29   ? Number of Visits 30   ? Date for PT Re-Evaluation 06/04/20   ? Authorization Type progress note performed on visit 18 Next on 28   ? PT Start Time 1145   ? PT Stop Time 1228   ? PT Time Calculation (min) 43 min   ? Activity Tolerance Patient tolerated treatment well   ? Behavior During Therapy [facility] for tasks assessed/performed   ? ?  ?  ? ?  ? ? ?Past Medical History:  ?Diagnosis Date  ? Bipolar 1 disorder ([place])   ? Breast cancer ([place])   ? Chronic diarrhea   ? loose stools twice a day on average for years  ? Chronic kidney disease (CKD), stage III (moderate) (HCC)   ? Depression 1987  ? Hospitalization or health care facility admission within last 6 months 04/2019  ? for fall/seizures  ? Malignant neoplasm of overlapping sites of left breast in female, estrogen receptor positive ([place]) 03/14/2016  ? Dx in 09/2014, s/p bilateral mastectomies and ALND, 0/10 LN. 1.4 cm  Grade I invasive lobular, ER and PR +/Her--, Ki 67 <5% Tried anastrozole for one month, but developed suicidal idea  ? Osteoarthritis, knee 09/24/2019  ? Xray 09/2019  ? Parkinson's disease ([place]) 2012  ? Psoriatic arthritis ([place])   ? PTSD (post-traumatic stress disorder)   ? Secondary hyperparathyroidism ([place])   ? Tardive dyskinesia   ? Thyroid disease   ? ? ?Past Surgical History:  ?Procedure Laterality Date  ? ABDOMINAL HYSTERECTOMY  1987  ? CHOLECYSTECTOMY  1979  ? [place] OF UTERUS  1973  ? MASTECTOMY Bilateral 09/19/2014  ? TONSILLECTOMY  1970  ? URETERAL  REIMPLANTION Bilateral 1974  ? ? ?There were no vitals filed for this visit. ? ? Subjective Assessment - 06/04/21 1148   ? ? Subjective The patient reports her knee has been a little achy. Se has been up on her feet a lot.   ? Limitations Standing;Walking;House hold activities   ? How long can you sit comfortably? no issues   ? How long can you stand comfortably? Could be immediate or an hour. For sure after an hour.   ? Patient Stated Goals Getting stronger, improving balance, pain free   ? Currently in Pain? Yes   ? Pain Score 4    ? Pain Location Back   ? Pain Orientation Right   ? Pain Descriptors / Indicators Aching   ? Pain Type Chronic pain   ? Pain Onset More than a month ago   ? Pain Frequency Intermittent   ? Aggravating Factors  exercises   ? Pain Relieving Factors strtching   ? Effect of Pain on Daily Activities pain sleeping   ? Multiple Pain Sites No   ? ?  ?  ? ?  ? ? ? ? ?    ?  AROM  ?  Overall AROM Comments right knee flexion  7 -110  ?     ?  Strength  ?  Right Hip Flexion --  25.8   ?  Right Hip ABduction --   29.2  ?  Left Hip Flexion --   20.1  ?  Left Hip Abduction --   28.6  ?  Right Knee Extension --   26.5  ?  Left Knee Extension --   31.7  ?     ?  Transfers  ?  Five time sit to stand comments  10   sec   ?  ?   ? ? ? ? ? ? ? ? ? ? ? ? ? ? ? ? ? ? ? ? ? ? ? ? ? ? PT Education - 06/04/21 1149   ? ? Education Details HEP and symptom managemet   ? Person(s) Educated Patient   ? Methods Explanation;Demonstration;Tactile cues;Verbal cues   ? Comprehension Verbalized understanding;Verbal cues required;Returned demonstration;Tactile cues required   ? ?  ?  ? ?  ? ? ? PT Short Term Goals - 06/04/21 1522   ? ?  ? PT SHORT TERM GOAL #1  ? Title Pt will improve tug with rollater below 14 secs to show improvement in ambulation.   ? Baseline 10 sec   ? Time 3   ? Period Weeks   ? Status Achieved   ?  ? PT SHORT TERM GOAL #2  ? Title Patient will improve strength for bilateral hip flexion to 5/5.   ?  Baseline tested with hand dyno today improving in most plains   ? Time 3   ? Period Weeks   ? Status Achieved   ? Target Date 05/17/21   ?  ? PT SHORT TERM GOAL #3  ? Title Patient will performed 5x sit to stand below 10  secs to show improvement is functional strenght.   ? Baseline 10 sec   ? Time 4   ? Period Weeks   ? Status Achieved   ? ?  ?  ? ?  ? ? ? ? PT Long Term Goals - 06/04/21 1523   ? ?  ? PT LONG TERM GOAL #1  ? Title Pt will be be able to perform six minute walk test and ambulate 700 feet without a device or with a less restricted device such as a cane independently in order to ambualte in the community   ? Baseline not tested today   ? Time 8   ? Period Weeks   ? Status Patient walking in the community with walker achieved  ? Target Date 06/14/21   ?  ? PT LONG TERM GOAL #2  ? Title Pt will go up and down 6 steps without increased pain   ? Baseline can go up nd down without pain   ? Time 6   ? Period Weeks   ? Status Achieved   ?  ? PT LONG TERM GOAL #3  ? Title Patient will be independent with pool program and general strengthening program in order to continue to progress mobility   ? Baseline indepdnent   ? Time 6   ? Period Weeks   ? Status Achieved   ? Target Date 06/14/21   ?  ? PT LONG TERM GOAL #4  ? Title Pt will be able to cook in the kitchen without having to rest due to pan  ? ? ?6 weeks ?Achieved   ? ?  ?  ? ?  ? ? ? ? ? ? ? ? Plan - 06/04/21 1353   ? ? Clinical Impression  Statement The patient has made great progress. She had periodic episodes of hip and knee pain but she has been able to resolve all of them She has joined the gym. She has a full pool and home program to work on. He strength and knee motion have improved signifcantly. At this time we will D/C to HEP. She was advised if she has a signifcant flair-up she can always come back with a new script. Today we reviewed her gym HEP. See below for goal specific progress.   ? Personal Factors and Comorbidities Fitness;Time since  onset of injury/illness/exacerbation;Comorbidity 3+   ? Comorbidities multiple pain locations, back, hip, knee, Decreased mobility post covid and hospitalization last year,   ? Examination-Activity Limitations Stand;Locomotion Level;Bend;Dressing;Squat;Stairs   ? Examination-Participation Restrictions Cleaning;Meal Prep;Yard Work;Community Activity;Shop;Laundry   ? Stability/Clinical Decision Making Evolving/Moderate complexity   ? Clinical Decision Making Low   ? Rehab Potential Good   ? PT Frequency One time visit   ? PT Treatment/Interventions ADLs/Self Care Home Management;Cryotherapy;Electrical Stimulation;Iontophoresis '4mg'$ /ml Dexamethasone;Ultrasound;DME Instruction;Gait training;Stair training;Therapeutic activities;Therapeutic exercise;Neuromuscular re-education;Patient/family education;Manual techniques;Passive range of motion;Dry needling;Taping   ? PT Next Visit Plan advance strengtheing and aerobic capacity.  28th visit - Progress note.   ? PT Home Exercise Plan Access Code: [facility]  URL: [email or web address]  Date: 09/06/2020  Prepared by: Carolyne Littles    Exercises  Seated Knee Extension with Resistance - 1 x daily - 7 x weekly - 3 sets - 10 reps  Seated March with Resistance - 1 x daily - 7 x weekly - 3 sets - 10 reps  Seated Hip Abduction with Resistance - 1 x daily - 7 x weekly - 3 sets - 10 reps   ? Consulted and Agree with Plan of Care Patient   ? ?  ?  ? ?  ? ? ?Patient will benefit from skilled therapeutic intervention in order to improve the following deficits and impairments:  Abnormal gait, Pain, Increased muscle spasms, Decreased activity tolerance, Decreased endurance, Decreased range of motion, Decreased strength, Impaired flexibility, Difficulty walking, Decreased balance, Decreased safety awareness, Decreased knowledge of precautions ? ?Visit Diagnosis: ?Other abnormalities of gait and mobility ? ?Unsteadiness on feet ? ?Muscle weakness (generalized) ? ?Pain in left  hip ? ?Stiffness of left hip, not elsewhere classified ? ? ? ? ?Problem List ?Patient Active Problem List  ? Diagnosis Date Noted  ? Osteoarthritis, knee 09/24/2019  ? CKD (chronic kidney disease) stage 4, GFR 15-29

## 2021-06-05 NOTE — Progress Notes (Signed)
REFERRING PROVIDER: ?Thornton Park, MD ?[address] ?[place],  [place] [address] ? ?PRIMARY PROVIDER:  ?Inda Coke, PA ? ?PRIMARY REASON FOR VISIT:  ?1. Family history of breast cancer   ?2. History of colonic polyps   ?3. History of breast cancer left 2016   ? ? ? ?HISTORY OF PRESENT ILLNESS:   ?Tiffany [name], a 71 y.o. female, was seen for a [place] cancer genetics consultation at the request of Dr. Tarri Glenn due to a personal and family history of breast cancer and colon polyps.  [name] presents to clinic today to discuss the possibility of a hereditary predisposition to cancer, genetic testing, and to further clarify her future cancer risks, as well as potential cancer risks for family members.  ? ?In 2017, at the age of 14, Ms. Hoots was diagnosed with cancer of the left breast. The treatment plan included a double mastectomy.  She did not undergo genetic testing at that time.  ? ?[name] is a 71 y.o. female with no personal history of cancer.   ? ?CANCER HISTORY:  ?Oncology History  ? No history exists.  ? ? ? ?RISK FACTORS:  ?Menarche was at age 70.  ?First live birth at age 11.  ?OCP use for approximately 1 years.  ?Ovaries intact: yes.  ?Hysterectomy: yes.  ?Menopausal status: postmenopausal.  ?HRT use: 0 years. ?Colonoscopy: yes; abnormal. ?Mammogram within the last year: no. ?Number of breast biopsies: 1. ?Up to date with pelvic exams: no. ?Any excessive radiation exposure in the past: no ? ?Past Medical History:  ?Diagnosis Date  ? Bipolar 1 disorder ([place])   ? Breast cancer ([place])   ? Chronic diarrhea   ? loose stools twice a day on average for years  ? Chronic kidney disease (CKD), stage III (moderate) (HCC)   ? Depression 1987  ? Family history of breast cancer   ? Hospitalization or health care facility admission within last 6 months 04/2019  ? for fall/seizures  ? Malignant neoplasm of overlapping sites of left breast in female, estrogen receptor positive ([place]) 03/14/2016  ? Dx in 09/2014, s/p  bilateral mastectomies and ALND, 0/10 LN. 1.4 cm  Grade I invasive lobular, ER and PR +/Her--, Ki 67 <5% Tried anastrozole for one month, but developed suicidal idea  ? Osteoarthritis, knee 09/24/2019  ? Xray 09/2019  ? Parkinson's disease ([place]) 2012  ? Personal history of malignant neoplasm of breast   ? Polyposis of colon   ? Psoriatic arthritis ([place])   ? PTSD (post-traumatic stress disorder)   ? Secondary hyperparathyroidism ([place])   ? Tardive dyskinesia   ? Thyroid disease   ? ? ?Past Surgical History:  ?Procedure Laterality Date  ? ABDOMINAL HYSTERECTOMY  1987  ? CHOLECYSTECTOMY  1979  ? [place] OF UTERUS  1973  ? MASTECTOMY Bilateral 09/19/2014  ? TONSILLECTOMY  1970  ? URETERAL REIMPLANTION Bilateral 1974  ? ? ?Social History  ? ?Socioeconomic History  ? Marital status: Married  ?  Spouse name: [name]  ? Number of children: 3  ? Years of education: Not on file  ? Highest education level: Not on file  ?Occupational History  ? Occupation: retired  ?Tobacco Use  ? Smoking status: Never  ? Smokeless tobacco: Never  ?Vaping Use  ? Vaping Use: Never used  ?Substance and Sexual Activity  ? Alcohol use: Never  ? Drug use: Never  ? Sexual activity: Not Currently  ?Other Topics Concern  ? Not on file  ?  Social History Narrative  ? Moved to area from [place] 08/2018  ? Lives one story home  ? Right handed.  ? ?Social Determinants of Health  ? ?Financial Resource Strain: Low Risk   ? Difficulty of Paying Living Expenses: Not hard at all  ?Food Insecurity: No Food Insecurity  ? Worried About Charity fundraiser in the Last Year: Never true  ? Ran Out of Food in the Last Year: Never true  ?Transportation Needs: No Transportation Needs  ? Lack of Transportation (Medical): No  ? Lack of Transportation (Non-Medical): No  ?Physical Activity: Insufficiently Active  ? Days of Exercise per Week: 2 days  ? Minutes of Exercise per Session: 50 min  ?Stress: No Stress Concern Present  ? Feeling of Stress : Not at all   ?Social Connections: Moderately Integrated  ? Frequency of Communication with Friends and Family: Three times a week  ? Frequency of Social Gatherings with Friends and Family: Three times a week  ? Attends Religious Services: More than 4 times per year  ? Active Member of Clubs or Organizations: No  ? Attends Archivist Meetings: Never  ? Marital Status: Married  ?  ? ?FAMILY HISTORY:  ?We obtained a detailed, 4-generation family history.  Significant diagnoses are listed below: ?Family History  ?Problem Relation Age of Onset  ? Lymphoma Mother 24  ? Lymphoma Sister 32  ? Breast cancer Sister 60  ? Colon polyps Sister   ? Lung cancer Sister   ?     former smoker  ? Stroke Maternal Grandfather   ? Diabetes Paternal Grandfather   ? Colon cancer Neg Hx   ? Esophageal cancer Neg Hx   ? Stomach cancer Neg Hx   ? Rectal cancer Neg Hx   ? ? ?The patient has two daughters and a son who are cancer free.  She had three sisters.  One had lymphoma and breast cancer, and another who had lung cancer.  One sister had some colon polyps and her daughter is currently being worked up for breast cancer.  Both parents are deceased. ? ?The patient's mother had lymphoma at 49. She had a maternal half brother who was cancer free.  The maternal grandparents died of a stroke. ? ?The patient's father died at 27.  He had a brother and sister who were cancer free and his parents were cancer free. ? ?Ms. Wieting is unaware of previous family history of genetic testing for hereditary cancer risks. Patient's maternal ancestors are of [place] descent, and paternal ancestors are of [place] descent. There is reported Ashkenazi Jewish ancestry. There is no known consanguinity. ? ?GENETIC COUNSELING ASSESSMENT: Ms. Stiner is a 71 y.o. female with a personal and family history of breast cancer, and a personal history of polyposis which is somewhat suggestive of a hereditary cancer syndrome and predisposition to cancer given the young age of onset  of breast cancer in her family, the polyposis in the patient and the Jewish ancestry. We, therefore, discussed and recommended the following at today's visit.  ? ?DISCUSSION: We discussed that, in general, most cancer is not inherited in families, but instead is sporadic or familial. Sporadic cancers occur by chance and typically happen at older ages (>50 years) as this type of cancer is caused by genetic changes acquired during an individual?s lifetime. Some families have more cancers than would be expected by chance; however, the ages or types of cancer are not consistent with a known genetic mutation or  known genetic mutations have been ruled out. This type of familial cancer is thought to be due to a combination of multiple genetic, environmental, hormonal, and lifestyle factors. While this combination of factors likely increases the risk of cancer, the exact source of this risk is not currently identifiable or testable. ? ?We discussed that 5 - 10% of breast cancer is hereditary, with most cases associated with BRCA mutations.  There are other genes that can be associated with hereditary breast cancer syndromes.  These include ATM, CHEK2 and PALB2.  Additionally, the patient has polyposis.  Colon polyps are relatively common, but most people have five or fewer.  When an individual has 10 or more polyps, it increases the chance that there is a hereditary polyposis syndrome.  The most common cause of hereditary polyposis syndromes are due to mutations in APC, but sometimes other genes can be involved such as MUTYH or BMPR1A.  We discussed that due to her Ashkenazi Jewish ancestry, founder mutations in both BRCA and APC can be seen.  Her father was jewish, and therefore a BRCA mutation could be masked in the men of the family.   ? ?Additionally, the APC founder mutation typically seen in [place] families does not convey the same risk for colon cancer as other typical APC mutations.  However, it can increase the  risk for colon polyps and cancer, and would chance how we follow the patient.  We discussed that testing is beneficial for several reasons including knowing how to follow individuals after completing their tre

## 2021-06-06 ENCOUNTER — Ambulatory Visit: Payer: Medicare Other | Admitting: Adult Health

## 2021-06-08 ENCOUNTER — Ambulatory Visit (INDEPENDENT_AMBULATORY_CARE_PROVIDER_SITE_OTHER): Payer: Medicare Other | Admitting: Psychiatry

## 2021-06-08 DIAGNOSIS — F4010 Social phobia, unspecified: Secondary | ICD-10-CM | POA: Diagnosis not present

## 2021-06-08 DIAGNOSIS — F3132 Bipolar disorder, current episode depressed, moderate: Secondary | ICD-10-CM

## 2021-06-08 DIAGNOSIS — F4310 Post-traumatic stress disorder, unspecified: Secondary | ICD-10-CM | POA: Diagnosis not present

## 2021-06-08 DIAGNOSIS — F411 Generalized anxiety disorder: Secondary | ICD-10-CM | POA: Diagnosis not present

## 2021-06-08 DIAGNOSIS — Z8669 Personal history of other diseases of the nervous system and sense organs: Secondary | ICD-10-CM

## 2021-06-08 DIAGNOSIS — F40232 Fear of other medical care: Secondary | ICD-10-CM

## 2021-06-08 NOTE — Progress Notes (Signed)
Psychotherapy Progress Note ?[facility] ?Tiffany Moore, PhD LP ? ?Patient ID: Tiffany [name])    MRN: [id number] ?Therapy format: Individual psychotherapy ?Date: 06/08/2021      Start: 11:10a     Stop: 12:01p     Time Spent: 51 min ?Location: In-person  ? ?Session narrative (presenting needs, interim history, self-report of stressors and symptoms, applications of prior therapy, status changes, and interventions made in session) ?EHR notes discharged from physical therapy this week, referral to endocrinology to assess thyroid, and genetic counseling preparatory meeting at which she agreed to go through with genetic testing. ? ?Acknowledges she is graduated from PT now and transitioning into health/fitness.  Hopeful Tiffany [name] will get audiology assessment to deal with his hearing (claims tinnitus).  Signed on for colonoscopy, beginning prep this weekend.  Interesting video featuring happy patients talking about their experiences, felt it was slightly hokey but helpful. ? ?Has found a new way to cope with anxiety, worry -- using Christian music on her phone (or in her head) and lets herself absorb into it and direct the music.  Been listening to more music anyway, which seems to be a good sign for mood and morale.  Adding little movements and dance steps sometimes.  Has wished Tiffany [name] could join in more with music and dance.   ? 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  Frustrated with his fatigue and lack of availability for her surging libido, but says it "hurts" not just because of that to feel as charged as she does.  Noted that it can be Bipolar mania trying to surface, but still, worth checking if it could be tied to endocrine function.  She's been reading, and is leery of adding medication, given her history of numerous sensitivities.  Among others, she notes that when she had what was most likely an estrogen blocker after mastectomy, she was suicidal within days.  Informed much  more likely a medication call would be reducing the dose of something, possibly levothyroxine.  Plausible that could resolve both the suspiciously lowered TSH (maybe the hypothalamus has been letting up a lot) and inconvenient libido.  Discussed at some length the likelihood that thyroid med is working very well, at least in the context of mental health improvements and physical therapy, which may be improving HPA Axis function.  Agreed to message physician on her behalf, for clarity and social anxiety about disclosing personal matters. ? ?Separately, good news re 6th grandchild Tiffany [name] arriving last week by emergency C-section.  Tiffany [name] and Tiffany [name] seem to be doing well, and sibs are excited.  Affirmed and encouraged. ? ?Therapeutic modalities: Cognitive Behavioral Therapy, Solution-Oriented/Positive Psychology, and Psycho-education/Bibliotherapy ? ?Mental Status/Observations: ? ?Appearance:   Casual and Neat     ?Behavior:  Appropriate  ?Motor:  Normal  ?Speech/Language:   Clear and Coherent  ?Affect:  Appropriate and responsive  ?Mood:  Apprehensive, more modulated  ?Thought process:  normal  ?Thought content:    WNL  ?Sensory/Perceptual disturbances:    WNL  ?Orientation:  Fully oriented  ?Attention:  Good  ?  ?Concentration:  Good  ?Memory:  Mild memory  ?Insight:    Good  ?Judgment:   Good  ?Impulse Control:  Good  ? ?Risk Assessment: ?Danger to Self: No Self-injurious Behavior: No ?Danger to Others: No Physical Aggression / Violence: No ?Duty to Warn: No Access to Firearms a concern: No ? ?Assessment of progress:  progressing ? ?Diagnosis: ?  ICD-10-CM   ?1. Generalized  anxiety disorder  F41.1   ?  ?2. PTSD (post-traumatic stress disorder)  F43.10   ?  ?3. Bipolar I disorder, most recent episode depressed, moderate ([place])  F31.32   ?  ?4. Social anxiety disorder  F40.10   ?  ?5. History of small, chronic left frontal infarct, as assessed by neurologist  [phone]   ?  ?6. History of metabolic encephalopathy  X21.19   ?   ?7. Fear of other medical care  [phone]   ?  ? ?Plan:  ?TX to message PCP and psychiatry re. suspected endocrine issue ?Continue self-paced exercise program as intended for overall health, fitness, balance, etc ?Monitor for any escalating signs of PTSD and be ready to retry alpha-blocker if needed ?Other recommendations/advice as may be noted above ?Continue to utilize previously learned skills ad lib ?Maintain medication as prescribed and work faithfully with relevant prescriber(s) if any changes are desired or seem indicated ?Call the clinic on-call service, 988/hotline, 911, or present to [facility] or ER if any life-threatening psychiatric crisis ?Return for session(s) already scheduled. ?Already scheduled visit in this office 06/13/2021. ? ?Tiffany Serve, PhD ?Tiffany Moore, PhD LP ?Clinical Psychologist, [facility] ?[facility] ?[address], [address] ?[place], [place] [address] ?(o) [phone] ?

## 2021-06-11 ENCOUNTER — Ambulatory Visit (AMBULATORY_SURGERY_CENTER): Payer: Medicare Other | Admitting: Gastroenterology

## 2021-06-11 ENCOUNTER — Encounter: Payer: Self-pay | Admitting: Gastroenterology

## 2021-06-11 ENCOUNTER — Other Ambulatory Visit: Payer: Self-pay | Admitting: Gastroenterology

## 2021-06-11 VITALS — BP 157/71 | HR 60 | Temp 98.4°F | Resp 17 | Ht 64.0 in | Wt 200.0 lb

## 2021-06-11 DIAGNOSIS — D128 Benign neoplasm of rectum: Secondary | ICD-10-CM

## 2021-06-11 DIAGNOSIS — D123 Benign neoplasm of transverse colon: Secondary | ICD-10-CM

## 2021-06-11 DIAGNOSIS — Z86010 Personal history of colonic polyps: Secondary | ICD-10-CM | POA: Diagnosis not present

## 2021-06-11 DIAGNOSIS — D122 Benign neoplasm of ascending colon: Secondary | ICD-10-CM

## 2021-06-11 MED ORDER — SODIUM CHLORIDE 0.9 % IV SOLN: 500.0000 mL | Freq: Once | INTRAVENOUS | Status: DC

## 2021-06-11 NOTE — Op Note (Addendum)
[place] ?Patient Name: Tiffany [name] ?Procedure Date: 06/11/2021 3:08 PM ?MRN: [id number] ?Endoscopist: Thornton Park MD, MD ?Age: 71 ?Referring MD:  ?Date of Birth: 09/07/1950 ?Gender: Female ?Account #: [id number] ?Procedure:                Colonoscopy ?Indications:              High risk colon cancer surveillance: Personal  ?                          history of multiple (3 or more) adenomas ?                          Colonoscopy 05/21/17 at [facility]  ?                          with Dr. Luci Bank that revealed 17 tubular  ?                          adenomas ?                          Surveillance colonoscopy recommended in 1 year ?Medicines:                Monitored Anesthesia Care ?Procedure:                Pre-Anesthesia Assessment: ?                          - Prior to the procedure, a History and Physical  ?                          was performed, and patient medications and  ?                          allergies were reviewed. The patient's tolerance of  ?                          previous anesthesia was also reviewed. The risks  ?                          and benefits of the procedure and the sedation  ?                          options and risks were discussed with the patient.  ?                          All questions were answered, and informed consent  ?                          was obtained. Prior Anticoagulants: The patient has  ?                          taken no previous anticoagulant or antiplatelet  ?  agents. ASA Grade Assessment: III - A patient with  ?                          severe systemic disease. After reviewing the risks  ?                          and benefits, the patient was deemed in  ?                          satisfactory condition to undergo the procedure. ?                          After obtaining informed consent, the colonoscope  ?                          was passed under direct vision. Throughout the  ?                           procedure, the patient's blood pressure, pulse, and  ?                          oxygen saturations were monitored continuously. The  ?                          CF [id number] #[id number] was introduced through the anus  ?                          and advanced to the the cecum, identified by  ?                          appendiceal orifice and ileocecal valve. The  ?                          colonoscopy was technically difficult and complex  ?                          due to poor endoscopic visualization, restricted  ?                          mobility of the colon and significant looping.  ?                          Successful completion of the procedure was aided by  ?                          changing the patient's position, withdrawing and  ?                          reinserting the scope and applying abdominal  ?                          pressure. The patient tolerated the procedure well.  ?  The quality of the bowel preparation was adequate  ?                          to identify polyps 6 mm and larger in size.  ?                          However, over 2035m of semi liquid stool with  ?                          seeds and food fibers was removed from the colon.  ?                          The ileocecal valve, appendiceal orifice, and  ?                          rectum were photographed. ?Scope In: 3:26:24 PM ?Scope Out: 3:57:31 PM ?Scope Withdrawal Time: 0 hours 20 minutes 1 second  ?Total Procedure Duration: 0 hours 31 minutes 7 seconds  ?Findings:                 The perianal and digital rectal examinations were  ?                          normal. ?                          Multiple small and large-mouthed diverticula were  ?                          found in the sigmoid colon and descending colon.  ?                          The colon is fixed in this area. ?                          The sigmoid colon was significantly tortuous. ?                          A 2 mm polyp was found in the  rectum. The polyp was  ?                          sessile. The polyp was removed with a cold snare.  ?                          Resection and retrieval were complete. Estimated  ?                          blood loss was minimal. ?                          A 3 mm polyp was found in the transverse colon. The  ?                          polyp was sessile. The polyp was  removed with a  ?                          cold snare. Resection and retrieval were complete.  ?                          Estimated blood loss was minimal. ?                          A 6-8 mm polyp was found in the hepatic flexure.  ?                          The polyp was sessile. The polyp was removed with a  ?                          cold snare. Resection and retrieval were complete.  ?                          Estimated blood loss was minimal. ?                          Four sessile polyps were found in the ascending  ?                          colon. The polyps were 2 to 5 mm in size. These  ?                          polyps were removed with a cold snare. Resection  ?                          and retrieval were complete. Estimated blood loss  ?                          was minimal. ?                          Two flat polyps were found in the ascending colon.  ?                          The polyps were <71m to 1 mm in size. These polyps  ?                          were removed with a cold biopsy forceps. Resection  ?                          and retrieval were complete. Estimated blood loss  ?                          was minimal. ?                          A moderate amount of stool was found in the entire  ?  colon, interfering with visualization. ?Complications:            No immediate complications. ?Estimated Blood Loss:     Estimated blood loss was minimal. ?Impression:               - Diverticulosis in the sigmoid colon and  ?                          descending colon. ?                          - Tortuous colon. ?                           - One 2 mm polyp in the rectum, removed with a cold  ?                          snare. Resected and retrieved. ?                          - One 3 mm polyp in the transverse colon, removed  ?                          with a cold snare. Resected and retrieved. ?                          - One 6-8 mm polyp at the hepatic flexure, removed  ?                          with a cold snare. Resected and retrieved. ?                          - Four 2 to 5 mm polyps in the ascending colon,  ?                          removed with a cold snare. Resected and retrieved. ?                          - Two <106m to 1 mm polyps in the ascending colon,  ?                          removed with a cold biopsy forceps. Resected and  ?                          retrieved. ?                          - Stool in the entire examined colon. Evaluation  ?                          for small and flat polyps is limited due retained  ?                          stool. ?Recommendation:           -  Patient has a contact number available for  ?                          emergencies. The signs and symptoms of potential  ?                          delayed complications were discussed with the  ?                          patient. Return to normal activities tomorrow.  ?                          Written discharge instructions were provided to the  ?                          patient. ?                          - High fiber diet. ?                          - Continue present medications. ?                          - Await pathology results. ?                          - Repeat colonoscopy in 1 year because the bowel  ?                          preparation was poor and for surveillance. Two day  ?                          bowel prep at that time. ?                          - Emerging evidence supports eating a diet of  ?                          fruits, vegetables, grains, calcium, and yogurt  ?                          while reducing red meat  and alcohol may reduce the  ?                          risk of colon cancer. ?                          - Given the number of polyps removed today, I  ?                          recommend that you consu

## 2021-06-11 NOTE — Progress Notes (Signed)
Pt. Uncomfortable.  During time in PACU head lowered, given 0.25 mg. Levsin, pt. Turned from side to side, pt. Has been able to pass air.  Given instructions for facilitating air passage at home. ?

## 2021-06-11 NOTE — Progress Notes (Signed)
Called to room to assist during endoscopic procedure.  Patient ID and intended procedure confirmed with present staff. Received instructions for my participation in the procedure from the performing physician.  

## 2021-06-11 NOTE — Progress Notes (Signed)
. ? ?Referring Provider: Inda Coke, PA ?Primary Care Physician:  Inda Coke, PA ? ?Indication for Procedure:  Colon cancer Surveillance ? ? ?IMPRESSION:  ?Need for colon cancer surveillance ?Appropriate candidate for monitored anesthesia care ? ?PLAN: ?Colonoscopy in the [place] today ? ? ?HPI: Tiffany [name] is a 71 y.o. female presents for surveillance colonoscopy. ? ?She had a colonoscopy 05/21/17 at [facility] with Dr. Luci Bank that revealed 17 tubular adenomas (2 transverse, 2 cecal, 5 ascending colon, 4 descending colon, 1 sigmoid, 1 rectum), severe left sided diverticulosis, tortuous sigmoid colon.  Random colon biopsies were negative at that time.  Surveillance colonoscopy recommended in 1 year.  ? ? ? ? ?Past Medical History:  ?Diagnosis Date  ? Bipolar 1 disorder ([place])   ? Breast cancer ([place])   ? Chronic diarrhea   ? loose stools twice a day on average for years  ? Chronic kidney disease (CKD), stage III (moderate) ([facility])   ? Depression 1987  ? Family history of breast cancer   ? Hospitalization or health care facility admission within last 6 months 04/2019  ? for fall/seizures  ? Malignant neoplasm of overlapping sites of left breast in female, estrogen receptor positive ([place]) 03/14/2016  ? Dx in 09/2014, s/p bilateral mastectomies and ALND, 0/10 LN. 1.4 cm  Grade I invasive lobular, ER and PR +/Her--, Ki 67 <5% Tried anastrozole for one month, but developed suicidal idea  ? Osteoarthritis, knee 09/24/2019  ? Xray 09/2019  ? Parkinson's disease ([place]) 2012  ? Personal history of malignant neoplasm of breast   ? Polyposis of colon   ? Psoriatic arthritis ([place])   ? PTSD (post-traumatic stress disorder)   ? Secondary hyperparathyroidism ([place])   ? Tardive dyskinesia   ? Thyroid disease   ? ? ?Past Surgical History:  ?Procedure Laterality Date  ? ABDOMINAL HYSTERECTOMY  1987  ? CHOLECYSTECTOMY  1979  ? Rudy OF UTERUS  1973  ? MASTECTOMY Bilateral 09/19/2014  ?  TONSILLECTOMY  1970  ? URETERAL REIMPLANTION Bilateral 1974  ? ? ?Current Outpatient Medications  ?Medication Sig Dispense Refill  ? Azelaic Acid 15 % gel     ? Certolizumab Pegol (CIMZIA) 2 X 200 MG KIT See admin instructions.    ? Cholecalciferol (VITAMIN D) 50 MCG (2000 UT) tablet     ? cholecalciferol (VITAMIN D3) 25 MCG (1000 UNIT) tablet Take 1,000 Units by mouth daily.    ? lamoTRIgine (LAMICTAL) 200 MG tablet Take 1 tablet (200 mg total) by mouth at bedtime. 90 tablet 3  ? levothyroxine (SYNTHROID) 112 MCG tablet TAKE 1 TABLET BY MOUTH EVERY DAY BEFORE BREAKFAST 90 tablet 0  ? lithium carbonate 300 MG capsule TAKE 1 CAPSULE(300 MG) BY MOUTH AT BEDTIME 90 capsule 3  ? LORazepam (ATIVAN) 1 MG tablet TAKE 1 TABLET(1 MG) BY MOUTH TWICE DAILY AS NEEDED 60 tablet 2  ? metroNIDAZOLE (METROGEL) 0.75 % gel Apply to face 1-2 times daily 45 g 0  ? prazosin (MINIPRESS) 1 MG capsule Take one capsule at bedtime for two weeks and then discontinue. 14 capsule 0  ? sodium bicarbonate 650 MG tablet Take 650 mg by mouth daily.    ? ?Current Facility-Administered Medications  ?Medication Dose Route Frequency Provider Last Rate Last Admin  ? 0.9 %  sodium chloride infusion  500 mL Intravenous Once Thornton Park, MD      ? ? ?Allergies as of 06/11/2021 - Review Complete 06/11/2021  ?Allergen Reaction Noted  ?  Aripiprazole Other (See Comments) 03/12/2017  ? Lactose intolerance (gi) Diarrhea 04/26/2019  ? Methotrexate Other (See Comments) 09/25/2017  ? Cefdinir Diarrhea 06/19/2017  ? Etanercept Other (See Comments) 11/24/2017  ? Exemestane Other (See Comments) 04/03/2016  ? Fluoxetine Other (See Comments) 03/12/2017  ? Lactose  07/31/2020  ? Methylprednisolone sodium succ Other (See Comments) 10/05/2014  ? Epinephrine Palpitations 10/05/2014  ? Nitrofurantoin Nausea And Vomiting and Rash 08/24/2010  ? ? ?Family History  ?Problem Relation Age of Onset  ? Lymphoma Mother 65  ? Lymphoma Sister 10  ? Breast cancer Sister 22  ?  Colon polyps Sister   ? Lung cancer Sister   ?     former smoker  ? Stroke Maternal Grandfather   ? Diabetes Paternal Grandfather   ? Colon cancer Neg Hx   ? Esophageal cancer Neg Hx   ? Stomach cancer Neg Hx   ? Rectal cancer Neg Hx   ? ? ? ?Physical Exam: ?General:   Alert,  well-nourished, pleasant and cooperative in NAD ?Head:  Normocephalic and atraumatic. ?Eyes:  Sclera clear, no icterus.   Conjunctiva pink. ?Mouth:  No deformity or lesions.   ?Neck:  Supple; no masses or thyromegaly. ?Lungs:  Clear throughout to auscultation.   No wheezes. ?Heart:  Regular rate and rhythm; no murmurs. ?Abdomen:  Soft, non-tender, nondistended, normal bowel sounds, no rebound or guarding.  ?Msk:  Symmetrical. No boney deformities ?LAD: No inguinal or umbilical LAD ?Extremities:  No clubbing or edema. ?Neurologic:  Alert and  oriented x4;  grossly nonfocal ?Skin:  No obvious rash or bruise. ?Psych:  Alert and cooperative. Normal mood and affect. ? ? ? ? ?Studies/Results: ?No results found. ? ? ? ?Zair Borawski L. Tarri Glenn, MD, MPH ?06/11/2021, 3:12 PM ? ? ?  ?

## 2021-06-11 NOTE — Patient Instructions (Signed)
Impression/Recommendations: ? ?Polyp and high fiber diet handouts given to patoent. ? ?Continue present medications. ?Await pathology results. ? ?Repeat colonoscopy in 1 year .  2 day bowel prep at that time. ? ?All first degree relatives should start colon cancer screening at age 73.   ?Dr. Tarri Glenn recommends you consult a genetic counselor. ? ?YOU HAD AN ENDOSCOPIC PROCEDURE TODAY AT [facility]:   Refer to the procedure report that was given to you for any specific questions about what was found during the examination.  If the procedure report does not answer your questions, please call your gastroenterologist to clarify.  If you requested that your care partner not be given the details of your procedure findings, then the procedure report has been included in a sealed envelope for you to review at your convenience later. ? ?YOU SHOULD EXPECT: Some feelings of bloating in the abdomen. Passage of more gas than usual.  Walking can help get rid of the air that was put into your GI tract during the procedure and reduce the bloating. If you had a lower endoscopy (such as a colonoscopy or flexible sigmoidoscopy) you may notice spotting of blood in your stool or on the toilet paper. If you underwent a bowel prep for your procedure, you may not have a normal bowel movement for a few days. ? ?Please Note:  You might notice some irritation and congestion in your nose or some drainage.  This is from the oxygen used during your procedure.  There is no need for concern and it should clear up in a day or so. ? ?SYMPTOMS TO REPORT IMMEDIATELY: ? ?Following lower endoscopy (colonoscopy or flexible sigmoidoscopy): ? Excessive amounts of blood in the stool ? Significant tenderness or worsening of abdominal pains ? Swelling of the abdomen that is new, acute ? Fever of 100?F or higher ?For urgent or emergent issues, a gastroenterologist can be reached at any hour by calling [phone]. ?Do not use MyChart  messaging for urgent concerns.  ? ? ?DIET:  We do recommend a small meal at first, but then you may proceed to your regular diet.  Drink plenty of fluids but you should avoid alcoholic beverages for 24 hours. ? ?ACTIVITY:  You should plan to take it easy for the rest of today and you should NOT DRIVE or use heavy machinery until tomorrow (because of the sedation medicines used during the test).   ? ?FOLLOW UP: ?Our staff will call the number listed on your records 48-72 hours following your procedure to check on you and address any questions or concerns that you may have regarding the information given to you following your procedure. If we do not reach you, we will leave a message.  We will attempt to reach you two times.  During this call, we will ask if you have developed any symptoms of COVID 19. If you develop any symptoms (ie: fever, flu-like symptoms, shortness of breath, cough etc.) before then, please call [phone].  If you test positive for Covid 19 in the 2 weeks post procedure, please call and report this information to [place].   ? ?If any biopsies were taken you will be contacted by phone or by letter within the next 1-3 weeks.  Please call us at [phone] if you have not heard about the biopsies in 3 weeks.  ? ? ?SIGNATURES/CONFIDENTIALITY: ?You and/or your care partner have signed paperwork which will be entered into your electronic medical record.  These signatures  attest to the fact that that the information above on your After Visit Summary has been reviewed and is understood.  Full responsibility of the confidentiality of this discharge information lies with you and/or your care-partner.  ? ? ?

## 2021-06-11 NOTE — Progress Notes (Signed)
Pt. Comfortable at time of discharge.  Has been able to expel air repeatedly.  Pt. Belching some air. ?

## 2021-06-11 NOTE — Progress Notes (Signed)
Report to PACU, RN, vss, BBS= Clear.  

## 2021-06-13 ENCOUNTER — Telehealth: Payer: Self-pay

## 2021-06-13 ENCOUNTER — Telehealth: Payer: Self-pay | Admitting: *Deleted

## 2021-06-13 ENCOUNTER — Ambulatory Visit (INDEPENDENT_AMBULATORY_CARE_PROVIDER_SITE_OTHER): Payer: Medicare Other | Admitting: Psychiatry

## 2021-06-13 DIAGNOSIS — F411 Generalized anxiety disorder: Secondary | ICD-10-CM

## 2021-06-13 DIAGNOSIS — R69 Illness, unspecified: Secondary | ICD-10-CM | POA: Diagnosis not present

## 2021-06-13 DIAGNOSIS — E039 Hypothyroidism, unspecified: Secondary | ICD-10-CM

## 2021-06-13 DIAGNOSIS — Z853 Personal history of malignant neoplasm of breast: Secondary | ICD-10-CM

## 2021-06-13 DIAGNOSIS — F4310 Post-traumatic stress disorder, unspecified: Secondary | ICD-10-CM

## 2021-06-13 DIAGNOSIS — Z8669 Personal history of other diseases of the nervous system and sense organs: Secondary | ICD-10-CM | POA: Diagnosis not present

## 2021-06-13 DIAGNOSIS — F3132 Bipolar disorder, current episode depressed, moderate: Secondary | ICD-10-CM

## 2021-06-13 DIAGNOSIS — Z86010 Personal history of colonic polyps: Secondary | ICD-10-CM

## 2021-06-13 NOTE — Progress Notes (Signed)
Psychotherapy Progress Note [facility] [name], PhD LP  Patient ID: YOUSRA [name])    MRN: [id number] Therapy format: Individual psychotherapy Date: 06/13/2021      Start: 1:05p     Stop: 1:55p     Time Spent: 50 min Location: In-person   Session narrative (presenting needs, interim history, self-report of stressors and symptoms, applications of prior therapy, status changes, and interventions made in session) Difficult week, up and down.  Notably more restless today.  Found dancing (upper body, while sitting) in waiting room listening to [place].  Reports maybe some times of rapid mood cycling, more labile anyway.  Today an angry outburst with Tiffany [name].  Tiffany [name] had to escort her out of restaurant for feeling like dancing.  Colonoscopy experience was jolting, not because of the procedure, in which she felt plenty safe, but for how various staff acted toward her.  Cites three nurses who gave her hurry up messages, rolled eyes when she couldn't answer med questions clearly, expressed impatience, barked orders.  One even literally told her to get her butt on the gurney, ignoring her pleas that she has arthritis and cant move that easily.  While she may have meant the "butt" comment playfully, it is not Tiffany [name]'s language, and it seemed sharply to her to be treating her as though she was supposed to know better and move faster so staff could meet production pressure of 70 patients a day (something they told her when she asked about the suite).  Validated as plausible.  Notes intense belly pain after the procedure, and the postop nurse gruffly telling her "I want Tiffany to get on all fours" without explaining anything.  Outburst while telling the story, shouting to the ceiling, "I just want my damn colonoscopy!"  Have witnessed similar outbursts before when telling of emotionally distressing events, allowed to pass rather than seem to recapitulate the emotional suppression of  her childhood.  As for results and complications, came through procedure fine except for postop pain.  Reveals she didn't take lithium and lamotrigine the one night, while prepping.  Reveals again that she had hoped to find a hopeless case of bowel cancer, since she still would rather die than live, but what they found was 9 precancerous polyps, all taken, and scheduled to f/u 1 year.     Empathic listening provided and resolved that it is OK for her to give feedback on her experience.  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  Consulted how to speak to it briefly, directly, and fairly, and reminded her she does not have to be angered or loud to qualify to be heard out, centering on points that 3 nurses were unnecessarily short with her, engaged in huffing, puffing, and eye rolling, and that it came across like they expected her to work at their pace, regardless of her condition and understanding, just because they are under production pressure.  If need be, OK to say it came across like she was being dismissed because she's psychiatric, since she thought they looked at her meds (or dxs) and changed.  In session, noted being haunted by the image of their angry faces, and used a quasi hypnotic tactic to detach (imagine as smoke pictures, exhale and blow them away).    Reveals that she still has suicidal thoughts many days, not really broken since admissions last fall, and that she  has addressed a trigger at home.  In the garage is a Physicist, medical totes, one of which has a large pair of scissors showing through the side whenever they pull in.  Finally asked Tiffany [name] to move it.  Affirmed voicing her need and renewed will to live.  Confirms no suicidal intent at this time, but clear she would still like some relief from the dilemma she feels living.  Support/empathy provided.   Important emotional hx -- mother was  frequently oversedated and incompetent, served as an Building control surveyor for [employer] not to be too oversedated herself and to stay alert.  Notable experiences having to take care of mother on emergency basis -- foaming at the mouth, seizure -- and sometimes having to fend for themselves while allegedly supervised by her.  Validated as frightening and conditioning to feel overwhelming responsibilities for others.  Briefly caught up on the effort to communicate with PCP, who seems to have misread [place]'s inquiry, simultaneously cautioning that she can't be expected (?) to address a clinical issue without the patient bringing it up and that she can't conscience modifying endocrine treatment when the patient has a history  In our communication, she did not address either the long wait for specialist care nor the issue of making sure her patient and husband accurately understand her feedback re. TSH testing.  Tiffany [name] herself portrays double messages that her hormone treatment is fine (TSH normal), that referral to endocrinology is portrayed as more of a palliative referral, and that she has not been informed of the concern about TSH levels, when Tiffany [name] says her husband made a chart and they brought it to PCP last visit when they discussed it.  Resolved that it would be better run by psychiatry the hypothesis that thyroid treatment may be too effective right now, and let medical providers sort it out between themselves, as communication from therapy seems to be either unwelcome or overinterpreted right now by PCP.  Therapeutic modalities: Cognitive Behavioral Therapy, Solution-Oriented/Positive Psychology, Ego-Supportive, and Assertiveness/Communication  Mental Status/Observations:  Appearance:   Casual and Neat     Behavior:  Appropriate  Motor:  Normal  Speech/Language:   Clear and Coherent  Affect:  Appropriate  Mood:  anxious  Thought process:  normal  Thought content:    WNL  Sensory/Perceptual disturbances:     WNL  Orientation:  Fully oriented  Attention:  Good    Concentration:  Good  Memory:  grossly intact  Insight:    Good  Judgment:   Good  Impulse Control:  Good   Risk Assessment: Danger to Self:  SI, controlled  Self-injurious Behavior: No Danger to Others: No Physical Aggression / Violence: No Duty to Warn: No Access to Firearms a concern: No  Assessment of progress:  stabilized  Diagnosis:   ICD-10-CM   1. PTSD (post-traumatic stress disorder)  F43.10     2. Bipolar I disorder, most recent episode depressed, moderate ([place])  F31.32    r/o mixed vs. PTSD-related anxiety reactions    3. History of small, chronic left frontal infarct, as assessed by neurologist  Z86.69     4. History of metabolic encephalopathy  P82.42     5. r/o pseudobulbar affect vs neurological and psychogenic disinhibition of affect  R69     6. Generalized anxiety disorder  F41.1     7. History of colonic polyps - recent discovery of 9 precancerous  Z86.010     8. History of breast cancer left 2016  Z85.3     9. Acquired hypothyroidism  E03.9    with current question about appropriate dosage levothyroxine     Plan:  Address with psychiatry problems of inconvenient libido, possible mixed manic symptoms and mood lability Address with psychiatry, possibly PCP, hypothesis that declining TSH in conjunction with these issues flags thyroid treatment for adjustment, before endocrinology is available in September.  Should also clarify the meaning of TSH tests, as prior experience and education from others suggest that TSH is not so reliable a test of thyroid functioning in older patients, and various conditions can be missed without a more thorough test. Option to raise customer service issue with colonoscopy center, use communication tips if so Maintain safety plan versus persistent risk of suicide, including voice the need for safety with others and continue medication supply under husband's control Continue  self-paced exercise program as intended for overall health, fitness, balance, etc Monitor for any escalating signs of PTSD and be ready to retry alpha-blocker if needed Other recommendations/advice as may be noted above Continue to utilize previously learned skills ad lib Maintain medication as prescribed and work faithfully with relevant prescriber(s) if any changes are desired or seem indicated Call the clinic on-call service, 988/hotline, 911, or present to [facility] or ER if any life-threatening psychiatric crisis Return in about 1 week (around 06/20/2021) for session(s) already scheduled. Already scheduled visit in this office 06/18/2021.  [name], PhD [name], PhD LP Clinical Psychologist, [facility] [address], [place], [place] [address] [phone]

## 2021-06-13 NOTE — Telephone Encounter (Signed)
No answer on second attempt follow up call.  ? ?

## 2021-06-13 NOTE — Telephone Encounter (Signed)
No answer, unable to leave a message, B.Ruvi Fullenwider RN. 

## 2021-06-14 ENCOUNTER — Encounter: Payer: Self-pay | Admitting: Genetic Counselor

## 2021-06-14 ENCOUNTER — Ambulatory Visit: Payer: Self-pay | Admitting: Genetic Counselor

## 2021-06-14 ENCOUNTER — Encounter: Payer: Self-pay | Admitting: Adult Health

## 2021-06-14 ENCOUNTER — Telehealth: Payer: Self-pay | Admitting: Genetic Counselor

## 2021-06-14 ENCOUNTER — Ambulatory Visit (INDEPENDENT_AMBULATORY_CARE_PROVIDER_SITE_OTHER): Payer: Medicare Other | Admitting: Adult Health

## 2021-06-14 DIAGNOSIS — G4700 Insomnia, unspecified: Secondary | ICD-10-CM

## 2021-06-14 DIAGNOSIS — F3132 Bipolar disorder, current episode depressed, moderate: Secondary | ICD-10-CM

## 2021-06-14 DIAGNOSIS — F4310 Post-traumatic stress disorder, unspecified: Secondary | ICD-10-CM

## 2021-06-14 DIAGNOSIS — Z86010 Personal history of colonic polyps: Secondary | ICD-10-CM

## 2021-06-14 DIAGNOSIS — F411 Generalized anxiety disorder: Secondary | ICD-10-CM

## 2021-06-14 DIAGNOSIS — Z803 Family history of malignant neoplasm of breast: Secondary | ICD-10-CM

## 2021-06-14 DIAGNOSIS — Z1379 Encounter for other screening for genetic and chromosomal anomalies: Secondary | ICD-10-CM | POA: Insufficient documentation

## 2021-06-14 NOTE — Progress Notes (Signed)
Tiffany [name] ?[id number] ?[birth year] ?71 y.o. ? ?Subjective:  ? ?Patient ID:  Tiffany [name] is a 71 y.o. (DOB [birth year]) female. ? ?Chief Complaint: No chief complaint on file. ? ? ?HPI ?Candiss Norse Mannis presents to the office today for follow-up of  PTSD, insomnia, GAD, BPD 1. ? ?Appears to be restless- moving and dancing.  ? ?Working with PCP to stabilize thyroid. ? ?Describes mood today as "ok". Pleasant. Tearful at times. Mood symptoms - denies depression. Anxious about situations she can't control. Has bouts of irritability. Reports mood instability for 3 weeks. Shouting out - "anger". Singing and dancing while awake - constantly moving - restless. Unsure what is going on with her, where it's going to lead to, or when it's going to stop. Concerns about decline in thyroid levels over the past several months - working with PCP. Concerned if she is having mixed episodes. Reports sexual arousal 4 times a day initially, but now once every other day. Recent family stressors surrounding her daughter. Seeing Dr. Rica Mote weekly. Varying interest and motivation. Taking medications as prescribed. ?Energy levels vary. Active, does not have a regular exercise routine. Going to P/T twice a week. ?Enjoys some usual interests and activities. Married. Lives with husband their daughter. Talking to family and friends.  ?Appetite adequate - not eating a lot. Weight loss.   ?Sleeps well most nights. Averages 4 to 5 hours past few nights. ?Focus and concentration stable. Completing tasks. Managing some aspects of household. Retired.  ?Denies SI - "no plans or intents". ?Denies HI.  ?Denies AH ?Denies VH  ?Denies flashbacks. ? ?Previous medications: Celexa, Zyprexa, Tegretol, Depakote, Serzone, Topamax, Seroquel, Effexor, Lexapro, Desipramine, Neurontin, Abilify, Geodon, Propanolol, Cymbalta, Cogentin, Trihexyphenadyl, Sinmmet, Provigil, Selegiline, Requip, Amantadine, Prozac, Mirapex, Azilect, Metoclopramide, Baclofen, Artane, Namenda,  Latuda. ? ?GAD-7   ? ?[place] Office Visit from 02/16/2021 in [place] Visit from 05/14/2019 in [place]  ?Total GAD-7 Score 13 21  ? ?  ? ?Mini-Mental   ? ?[place] Office Visit from 06/07/2019 in [facility]  ?Total Score (max 30 points ) 29  ? ?  ? ?PHQ2-9   ? ?Flowsheet Row Clinical Support from 04/02/2021 in [place] Office Visit from 02/16/2021 in [place] from 07/26/2020 in [place] Visit from 06/05/2020 in [place] from 03/16/2020 in [name]  ?PHQ-2 Total Score 1 3 1 4 2   ?PHQ-9 Total Score 1 6 -- 11 3  ? ?  ? ?Flowsheet Row Clinical Support from 04/02/2021 in [place] [facility] from 03/21/2021 in [facility] ED from 02/03/2021 in [facility]  ?C-SSRS RISK CATEGORY No Risk No Risk No Risk  ? ?  ?  ? ?Review of Systems:  ?Review of Systems  ?Musculoskeletal:  Negative for gait problem.  ?Neurological:  Negative for tremors.  ?Psychiatric/Behavioral:    ?     Please refer to HPI  ? ?Medications: I have reviewed the patient's current medications. ? ?Current Outpatient Medications  ?Medication Sig Dispense Refill  ? Azelaic Acid 15 % gel     ? Certolizumab Pegol (CIMZIA) 2 X 200 MG KIT See admin instructions.    ? Cholecalciferol (VITAMIN D) 50 MCG (2000 UT) tablet     ? cholecalciferol (VITAMIN D3) 25 MCG (1000 UNIT) tablet Take 1,000 Units by mouth daily.    ?  lamoTRIgine (LAMICTAL) 200 MG tablet Take 1 tablet (200 mg total) by mouth at bedtime. 90 tablet 3  ? levothyroxine (SYNTHROID) 112 MCG tablet TAKE 1 TABLET BY MOUTH EVERY DAY BEFORE BREAKFAST 90 tablet 0  ? lithium carbonate 300 MG capsule TAKE 1 CAPSULE(300 MG) BY MOUTH AT BEDTIME 90 capsule 3  ? LORazepam (ATIVAN) 1 MG tablet TAKE 1 TABLET(1  MG) BY MOUTH TWICE DAILY AS NEEDED 60 tablet 2  ? metroNIDAZOLE (METROGEL) 0.75 % gel Apply to face 1-2 times daily 45 g 0  ? prazosin (MINIPRESS) 1 MG capsule Take one capsule at bedtime for two weeks and then discontinue. 14 capsule 0  ? sodium bicarbonate 650 MG tablet Take 650 mg by mouth daily.    ? ?No current facility-administered medications for this visit.  ? ? ?Medication Side Effects: None ? ?Allergies:  ?Allergies  ?Allergen Reactions  ? Aripiprazole Other (See Comments)  ?  Parkinsonism ?  ?  ? Lactose Intolerance (Gi) Diarrhea  ? Methotrexate Other (See Comments)  ?  Hair loss, severe stomatitis ? ?  ? Cefdinir Diarrhea  ?  Other reaction(s): Diarrhea ?Yeast infection and fever; negative c diff  ? Etanercept Other (See Comments)  ?  Headaches ? ?  ? Exemestane Other (See Comments)  ?  Suicidal thoughts with medication ? ?  ? Fluoxetine Other (See Comments)  ?  Parkinsonism  ? Lactose   ?  Other reaction(s): diarrhea ?Other reaction(s): diarrhea ?Other reaction(s): diarrhea  ? Methylprednisolone Sodium Succ Other (See Comments)  ?  Agitated mania  ? Epinephrine Palpitations  ?  tachycardia ?  ? Nitrofurantoin Nausea And Vomiting and Rash  ?    ?Other reaction(s): rash, diarrhea ?Other reaction(s): rash, diarrhea ?Other reaction(s): rash, diarrhea ?Other reaction(s): rash, diarrhea ?Other reaction(s): rash, diarrhea  ? ? ?Past Medical History:  ?Diagnosis Date  ? Bipolar 1 disorder ([place])   ? Breast cancer ([place])   ? Chronic diarrhea   ? loose stools twice a day on average for years  ? Chronic kidney disease (CKD), stage III (moderate) (H[facility])   ? Depression 1987  ? Family history of breast cancer   ? Hospitalization or health care facility admission within last 6 months 04/2019  ? for fall/seizures  ? Malignant neoplasm of overlapping sites of left breast in female, estrogen receptor positive ([place]) 03/14/2016  ? Dx in 09/2014, s/p bilateral mastectomies and ALND, 0/10 LN. 1.4 cm  Grade I invasive lobular, ER  and PR +/Her--, Ki 67 <5% Tried anastrozole for one month, but developed suicidal idea  ? Osteoarthritis, knee 09/24/2019  ? Xray 09/2019  ? Parkinson's disease ([place]) 2012  ? Personal history of malignant neoplasm of breast   ? Polyposis of colon   ? Psoriatic arthritis ([place])   ? PTSD (post-traumatic stress disorder)   ? Secondary hyperparathyroidism ([place])   ? Tardive dyskinesia   ? Thyroid disease   ? ? ?Past Medical History, Surgical history, Social history, and Family history were reviewed and updated as appropriate.  ? ?Please see review of systems for further details on the patient's review from today.  ? ?Objective:  ? ?Physical Exam:  ?There were no vitals taken for this visit. ? ?Physical Exam ?Constitutional:   ?   General: She is not in acute distress. ?Musculoskeletal:     ?   General: No deformity.  ?Neurological:  ?   Mental Status: She is alert and oriented to person, place, and time.  ?  Coordination: Coordination normal.  ?Psychiatric:     ?   Attention and Perception: Attention and perception normal. She does not perceive auditory or visual hallucinations.     ?   Mood and Affect: Mood normal. Mood is not anxious or depressed. Affect is not labile, blunt, angry or inappropriate.     ?   Speech: Speech normal.     ?   Behavior: Behavior normal.     ?   Thought Content: Thought content normal. Thought content is not paranoid or delusional. Thought content does not include homicidal or suicidal ideation. Thought content does not include homicidal or suicidal plan.     ?   Cognition and Memory: Cognition and memory normal.     ?   Judgment: Judgment normal.  ?   Comments: Insight intact  ? ? ?Lab Review:  ?   ?Component Value Date/Time  ? NA 143 05/22/2021 1044  ? NA 138 06/08/2019 0000  ? K 4.6 05/22/2021 1044  ? CL 111 (H) 05/22/2021 1044  ? CO2 24 05/22/2021 1044  ? GLUCOSE 96 05/22/2021 1044  ? BUN 29 (H) 05/22/2021 1044  ? BUN 29 (A) 06/08/2019 0000  ? CREATININE 2.29 (H) 05/22/2021 1044  ? CALCIUM  9.7 05/22/2021 1044  ? PROT 7.8 03/21/2021 1123  ? ALBUMIN 4.5 03/21/2021 1123  ? AST 15 03/21/2021 1123  ? ALT 12 03/21/2021 1123  ? ALKPHOS 106 03/21/2021 1123  ? BILITOT 1.2 03/21/2021 1123  ? GFRNON

## 2021-06-14 NOTE — Telephone Encounter (Signed)
Revealed negative genetic testing.  Discussed that we do not know why she has breast cancer and polyposis or why there is cancer in the family. It could be due to a different gene that we are not testing, or maybe our current technology may not be able to pick something up.  It will be important for her to keep in contact with genetics to keep up with whether additional testing may be needed. ? ? ?

## 2021-06-14 NOTE — Progress Notes (Signed)
HPI:  Ms. Cater was previously seen in the [place] clinic due to a personal and family history of breast cancer and personal history of polyposis and concerns regarding a hereditary predisposition to cancer. Please refer to our prior cancer genetics clinic note for more information regarding our discussion, assessment and recommendations, at the time. Ms. Hersman recent genetic test results were disclosed to her, as were recommendations warranted by these results. These results and recommendations are discussed in more detail below. ? ?CANCER HISTORY:  ?Oncology History  ?Malignant neoplasm of overlapping sites of left breast in female, estrogen receptor positive ([place])  ?03/14/2016 Initial Diagnosis  ? Malignant neoplasm of overlapping sites of left breast in female, estrogen receptor positive ([place]) ?  ?06/12/2021 Genetic Testing  ? Negative genetic testing on the CancerNext-Expanded+RNAinsight panel.  The report date is June 12, 2021. ? ?The CancerNext-Expanded gene panel offered by [facility] and includes sequencing and rearrangement analysis for the following 77 genes: AIP, ALK, APC*, ATM*, AXIN2, BAP1, BARD1, BLM, BMPR1A, BRCA1*, BRCA2*, BRIP1*, CDC73, CDH1*, CDK4, CDKN1B, CDKN2A, CHEK2*, CTNNA1, DICER1, FANCC, FH, FLCN, GALNT12, KIF1B, LZTR1, MAX, MEN1, MET, MLH1*, MSH2*, MSH3, MSH6*, MUTYH*, NBN, NF1*, NF2, NTHL1, PALB2*, PHOX2B, PMS2*, POT1, PRKAR1A, PTCH1, PTEN*, RAD51C*, RAD51D*, RB1, RECQL, RET, SDHA, SDHAF2, SDHB, SDHC, SDHD, SMAD4, SMARCA4, SMARCB1, SMARCE1, STK11, SUFU, TMEM127, TP53*, TSC1, TSC2, VHL and XRCC2 (sequencing and deletion/duplication); EGFR, EGLN1, HOXB13, KIT, MITF, PDGFRA, POLD1, and POLE (sequencing only); EPCAM and GREM1 (deletion/duplication only). DNA and RNA analyses performed for * genes.  ?  ? ? ?FAMILY HISTORY:  ?We obtained a detailed, 4-generation family history.  Significant diagnoses are listed below: ?Family History  ?Problem Relation Age of Onset  ?  Lymphoma Mother 69  ? Lymphoma Sister 51  ? Breast cancer Sister 2  ? Colon polyps Sister   ? Lung cancer Sister   ?     former smoker  ? Stroke Maternal Grandfather   ? Diabetes Paternal Grandfather   ? Colon cancer Neg Hx   ? Esophageal cancer Neg Hx   ? Stomach cancer Neg Hx   ? Rectal cancer Neg Hx   ? ? ? ?The patient has two daughters and a son who are cancer free.  She had three sisters.  One had lymphoma and breast cancer, and another who had lung cancer.  One sister had some colon polyps and her daughter is currently being worked up for breast cancer.  Both parents are deceased. ?  ?The patient's mother had lymphoma at 22. She had a maternal half brother who was cancer free.  The maternal grandparents died of a stroke. ?  ?The patient's father died at 67.  He had a brother and sister who were cancer free and his parents were cancer free. ?  ?Ms. Milford is unaware of previous family history of genetic testing for hereditary cancer risks. Patient's maternal ancestors are of [place] descent, and paternal ancestors are of [place] descent. There is reported Ashkenazi Jewish ancestry. There is no known consanguinity. ? ?GENETIC TEST RESULTS: Genetic testing reported out on June 12, 2021 through the CancerNext-Expanded+RNAinsight cancer panel found no pathogenic mutations. The CancerNext-Expanded gene panel offered by [facility] and includes sequencing and rearrangement analysis for the following 77 genes: AIP, ALK, APC*, ATM*, AXIN2, BAP1, BARD1, BLM, BMPR1A, BRCA1*, BRCA2*, BRIP1*, CDC73, CDH1*, CDK4, CDKN1B, CDKN2A, CHEK2*, CTNNA1, DICER1, FANCC, FH, FLCN, GALNT12, KIF1B, LZTR1, MAX, MEN1, MET, MLH1*, MSH2*, MSH3, MSH6*, MUTYH*, NBN, NF1*, NF2, NTHL1, PALB2*, PHOX2B,  PMS2*, POT1, PRKAR1A, PTCH1, PTEN*, RAD51C*, RAD51D*, RB1, RECQL, RET, SDHA, SDHAF2, SDHB, SDHC, SDHD, SMAD4, SMARCA4, SMARCB1, SMARCE1, STK11, SUFU, TMEM127, TP53*, TSC1, TSC2, VHL and XRCC2 (sequencing and deletion/duplication); EGFR, EGLN1,  HOXB13, KIT, MITF, PDGFRA, POLD1, and POLE (sequencing only); EPCAM and GREM1 (deletion/duplication only). DNA and RNA analyses performed for * genes. The test report has been scanned into EPIC and is located under the Molecular Pathology section of the Results Review tab.  A portion of the result report is included below for reference.  ? ? ? ?We discussed with Ms. Hostetler that because current genetic testing is not perfect, it is possible there may be a gene mutation in one of these genes that current testing cannot detect, but that chance is small.  We also discussed, that there could be another gene that has not yet been discovered, or that we have not yet tested, that is responsible for the cancer diagnoses in the family. It is also possible there is a hereditary cause for the cancer in the family that Ms. Seier did not inherit and therefore was not identified in her testing.  Therefore, it is important to remain in touch with cancer genetics in the future so that we can continue to offer Ms. Erler the most up to date genetic testing.  ? ?ADDITIONAL GENETIC TESTING: We discussed with Ms. Halladay that her genetic testing was fairly extensive.  If there are genes identified to increase cancer risk that can be analyzed in the future, we would be happy to discuss and coordinate this testing at that time.   ? ?CANCER SCREENING RECOMMENDATIONS: Ms. Imes test result is considered negative (normal).  This means that we have not identified a hereditary cause for her personal and family history of cancer and personal history of polyposis at this time. Most cancers happen by chance and this negative test suggests that her cancer may fall into this category.   ? ?While reassuring, this does not definitively rule out a hereditary predisposition to cancer. It is still possible that there could be genetic mutations that are undetectable by current technology. There could be genetic mutations in genes that have not been tested or  identified to increase cancer risk.  Therefore, it is recommended she continue to follow the cancer management and screening guidelines provided by her gastroenterology provider and primary healthcare provider.  ? ?An individual's cancer risk and medical management are not determined by genetic test results alone. Overall cancer risk assessment incorporates additional factors, including personal medical history, family history, and any available genetic information that may result in a personalized plan for cancer prevention and surveillance ? ?RECOMMENDATIONS FOR FAMILY MEMBERS:  Individuals in this family might be at some increased risk of developing cancer, over the general population risk, simply due to the family history of cancer.  We recommended women in this family have a yearly mammogram beginning at age 44, or 71 years younger than the earliest onset of cancer, an annual clinical breast exam, and perform monthly breast self-exams. Women in this family should also have a gynecological exam as recommended by their primary provider. All family members should be referred for colonoscopy starting at age 21. ? ?FOLLOW-UP: Lastly, we discussed with Ms. Hurta that cancer genetics is a rapidly advancing field and it is possible that new genetic tests will be appropriate for her and/or her family members in the future. We encouraged her to remain in contact with cancer genetics on an annual basis so we can update  her personal and family histories and let her know of advances in cancer genetics that may benefit this family.  ? ?Our contact number was provided. Ms. Cordle questions were answered to her satisfaction, and she knows she is welcome to call us at anytime with additional questions or concerns.  ? ?Roma Kayser, MS, [place] ?Licensed, Insurance risk surveyor ?[email or web address] ? ?

## 2021-06-16 ENCOUNTER — Encounter: Payer: Self-pay | Admitting: Gastroenterology

## 2021-06-18 ENCOUNTER — Ambulatory Visit: Payer: Medicare Other | Admitting: Adult Health

## 2021-06-20 ENCOUNTER — Ambulatory Visit (INDEPENDENT_AMBULATORY_CARE_PROVIDER_SITE_OTHER): Payer: Medicare Other | Admitting: Psychiatry

## 2021-06-20 DIAGNOSIS — U099 Post covid-19 condition, unspecified: Secondary | ICD-10-CM

## 2021-06-20 DIAGNOSIS — G4700 Insomnia, unspecified: Secondary | ICD-10-CM

## 2021-06-20 DIAGNOSIS — F3160 Bipolar disorder, current episode mixed, unspecified: Secondary | ICD-10-CM | POA: Diagnosis not present

## 2021-06-20 DIAGNOSIS — Z8669 Personal history of other diseases of the nervous system and sense organs: Secondary | ICD-10-CM

## 2021-06-20 DIAGNOSIS — F4310 Post-traumatic stress disorder, unspecified: Secondary | ICD-10-CM

## 2021-06-20 DIAGNOSIS — R41840 Attention and concentration deficit: Secondary | ICD-10-CM

## 2021-06-20 DIAGNOSIS — F4010 Social phobia, unspecified: Secondary | ICD-10-CM

## 2021-06-20 DIAGNOSIS — E039 Hypothyroidism, unspecified: Secondary | ICD-10-CM

## 2021-06-20 NOTE — Progress Notes (Signed)
Psychotherapy Progress Note [facility] [name], PhD LP  Patient ID: Tiffany [name])    MRN: [id number] Therapy format: Family therapy w/ patient -- accompanied by Tiffany [name] Date: 06/20/2021      Start: 11:18a     Stop: 12:08p     Time Spent: 50 min Location: In-person   Session narrative (presenting needs, interim history, self-report of stressors and symptoms, applications of prior therapy, status changes, and interventions made in session) Arthritis flareup and mood ups and downs of late.  Rheumatologist called in prednisone taper, started last night, and within 2 hrs felt like dying.  Negotiated a lowered dose, and refused injection, in respect of known history of mood instability.  Known emotional stressors right now have been the aftermath of the colonoscopy (at this point, much less urgency around the idea of giving feedback) and Tiffany [name]'s situation waiting restlessly at home while on disability leave.  Has generally worked out being able to have intimate time with Tiffany [name] without worrying about Tiffany [name].  A few nights had unstoppable energy middle of the night till 3 or 4am.  Hears music in her mind.  Some concern it could be hypomania trying to happen.  Historically, had episodes that turned to speeding, hard time recalling what else may have been alarming.  Best memory of H that when she went high, it usually meant a lot of up night cooking and cleaning.  Recently, has been in a lot of motion, suggests it may have been the cause of her arthritis flareup.    Does have history of seizure activity 2 years ago and was on Keppra.  COVID [name] 2021, lost appetite, Tiffany [name] kept her taking all meds, she had monoclonal antibodies, wound up losing her balance, taken to hospital for encephalopathy.    Affirmed being clear with her rheumatologist and encouraged collaborating on alternative treatment of inflammation.  Affirmed that she has these times of feeling that come on  strong, and she does want more help with these "seizures" of mood.  Willing to approach psychiatry about further increasing stabilizer, and says it would be OK with her to go ahead and reduce levothyroxine if she could get a prescriber to hear her concern.  Again, redirected to PCP, unless endocrinology is officially in charge of that medication, in which case direct contact would make more sense.    Therapeutic modalities: Cognitive Behavioral Therapy, Solution-Oriented/Positive Psychology, and Ego-Supportive  Mental Status/Observations:  Appearance:   Casual     Behavior:  Appropriate  Motor:  Normal  Speech/Language:   Clear and Coherent  Affect:  Appropriate and Constricted  Mood:  anxious and dysthymic  Thought process:  normal  Thought content:    WNL  Sensory/Perceptual disturbances:    WNL  Orientation:  Fully oriented  Attention:  Good    Concentration:  Fair  Memory:  grossly intact  Insight:    Fair  Judgment:   Good  Impulse Control:  Good   Risk Assessment: Danger to Self: No Self-injurious Behavior: No Danger to Others: No Physical Aggression / Violence: No Duty to Warn: No Access to Firearms a concern: No  Assessment of progress:  stabilized  Diagnosis:   ICD-10-CM   1. PTSD (post-traumatic stress disorder)  F43.10     2. Bipolar affective disorder, mixed ([place])  F31.60     3. Social anxiety disorder  F40.10     4. Insomnia, unspecified type  G47.00     5. 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 Acquired hypothyroidism  E03.9    With active treatment question    7. History of metabolic encephalopathy  C94.49     8. History of small, chronic left frontal infarct, as assessed by neurologist  [phone]      Plan:  Address with psychiatry problems of inconvenient libido, possible mixed manic symptoms and mood lability Address with psychiatry, possibly PCP, hypothesis that declining TSH in  conjunction with these issues flags thyroid treatment for adjustment, before endocrinology is available in September.  Should also clarify the meaning of TSH tests, as prior experience and education from others suggest that TSH is not so reliable a test of thyroid functioning in older patients, and various conditions can be missed without a more thorough test. Option to raise customer service issue with colonoscopy center, use communication tips if so Maintain safety plan versus persistent risk of suicide, including voice the need for safety with others and continue medication supply under husband's control Continue self-paced exercise program as intended for overall health, fitness, balance, etc Monitor for any escalating signs of PTSD and be ready to retry alpha-blocker if needed Other recommendations/advice as may be noted above Continue to utilize previously learned skills ad lib Maintain medication as prescribed and work faithfully with relevant prescriber(s) if any changes are desired or seem indicated Call the clinic on-call service, 988/hotline, 911, or present to [facility] or ER if any life-threatening psychiatric crisis No follow-ups on file. Already scheduled visit in this office 06/25/2021.  [name], PhD [name], PhD LP Clinical Psychologist, [facility] [address] [place], [place] [address] [phone]

## 2021-06-25 ENCOUNTER — Ambulatory Visit (INDEPENDENT_AMBULATORY_CARE_PROVIDER_SITE_OTHER): Payer: Medicare Other | Admitting: Psychiatry

## 2021-06-25 DIAGNOSIS — F411 Generalized anxiety disorder: Secondary | ICD-10-CM

## 2021-06-25 DIAGNOSIS — F4310 Post-traumatic stress disorder, unspecified: Secondary | ICD-10-CM

## 2021-06-25 DIAGNOSIS — E039 Hypothyroidism, unspecified: Secondary | ICD-10-CM | POA: Diagnosis not present

## 2021-06-25 DIAGNOSIS — F4010 Social phobia, unspecified: Secondary | ICD-10-CM | POA: Diagnosis not present

## 2021-06-25 DIAGNOSIS — Z8669 Personal history of other diseases of the nervous system and sense organs: Secondary | ICD-10-CM

## 2021-06-25 DIAGNOSIS — F3160 Bipolar disorder, current episode mixed, unspecified: Secondary | ICD-10-CM

## 2021-06-25 NOTE — Progress Notes (Signed)
Psychotherapy Progress Note [facility] [name], PhD LP  Patient ID: Tiffany [name])    MRN: [id number] Therapy format: Individual psychotherapy Date: 06/25/2021      Start: 11:13a     Stop: 12:04p     Time Spent: 51 min Location: In-person   Session narrative (presenting needs, interim history, self-report of stressors and symptoms, applications of prior therapy, status changes, and interventions made in session) Last seen by psychiatry 4/6, at which time urgent care referral made for thyroid check, lithium level, and CMP, and understanding there was that she was "working with" PCP to stabilize thyroid, whereas PT's and PCP's account to me has been that PCP defers to endocrinology, despite 33-monthwait.  Next PCP 5/1.  Encouraged to clarify as needed.   New granddaughter 317 wksold, DIL [name] recovering and opened up thankfully about the help she's getting from the family.  Got invited to come over, which helps.  Observes that she is unnaturally calm on the outside right now, but other things are roiling inside.  For one, worry for KNunzio Coryshutting herself in these days, phobic about running into people from school while she' on disability leave, recently challenged her to think of how she would address running into someone instead of just avoid.  For another, feels KNunzio Coryhorning in now, inviting herself along with NIzora Gala& WDuard Bradygoing out, when NMaayanwould prefer to be more relaxed and enjoy couple time traveling to/from this appt.  Also regards herself as "still being manic", with times of music playing in her head and an uncontrollable urge to dance.  Called a friend who recalled some rapid cycling, with being abruptly sociable.  Knows her colonoscopy and recent prednisone have provoked some mood lability.  Almost done with prednisone now, figures it to resolve.    Brings up experience at her last med check, which she thought was 4/15, then 4/13, turned out to be  4/6.  Remarks how often psychiatrist said, "I've never seen you this way!" (dancing, manic-like), calling PCP's office to arrange labs and expressing frustrations, blurting out working thoughts about running late and not being able to finish her notes.  Seems a bit shaken, taken aback by the experience.  Validated surprise, normalized as psychiatrist expressing unfiltered what she actually said, surprised to encounter her this way.  Urgent care visit physician said her movements are not drug induced.  Still having inconvenient libido, 3-4 times a day, and waves of feeling like dancing.    Worked out talking points for PCP in session, in order to best fulfill her own responsibility to articulate her needs and resolve valid question of iatrogenic mood cycling.  Therapeutic modalities: Cognitive Behavioral Therapy, Solution-Oriented/Positive Psychology, Ego-Supportive, and Assertiveness/Communication  Mental Status/Observations:  Appearance:   Casual and Neat     Behavior:  Appropriate  Motor:  Normal  Speech/Language:   Clear and Coherent  Affect:  Appropriate  Mood:  anxious  Thought process:  normal  Thought content:    WNL  Sensory/Perceptual disturbances:    WNL  Orientation:  Fully oriented  Attention:  Good    Concentration:  Good  Memory:  WNL  Insight:    Fair  Judgment:   Good  Impulse Control:  Good   Risk Assessment: Danger to Self: No Self-injurious Behavior: No Danger to Others: No Physical Aggression / Violence: No Duty to Warn: No Access to Firearms a concern: No  Assessment of progress:  stabilized  Diagnosis:  ICD-10-CM   1. Generalized anxiety disorder  F41.1     2. Bipolar affective disorder, mixed ([place])  F31.60     3. Acquired hypothyroidism  E03.9     4. Social anxiety disorder  F40.10     5. PTSD (post-traumatic stress disorder)  F43.10     6. History of metabolic encephalopathy  Y30.16     7. History of small, chronic left frontal infarct, as  assessed by neurologist  [phone]      Plan:  Address worry for [name] with [name] if able Address with psychiatry problems of inconvenient libido, mixed manic symptoms and mood lability Address with PCP with to get first available endocrinology appt to check hypothesis that thyroid treatment needs trimming in service of mood stability Continue self-paced exercise program as intended for overall health, fitness, balance, etc  Maintain safety plan versus persistent risk of suicide, including voice the need for safety with others and continue medication supply under husband's control Monitor for any escalating signs of PTSD and be ready to retry alpha-blocker if needed Other recommendations/advice as may be noted above Continue to utilize previously learned skills ad lib Maintain medication as prescribed and work faithfully with relevant prescriber(s) if any changes are desired or seem indicated Call the clinic on-call service, 988/hotline, 911, or present to [facility] or ER if any life-threatening psychiatric crisis Return for session(s) already scheduled. Already scheduled visit in this office 07/09/2021.  [name], PhD [name], PhD LP Clinical Psychologist, [facility] [address], [place], [place] [address] [phone]

## 2021-06-26 ENCOUNTER — Other Ambulatory Visit: Payer: Self-pay | Admitting: Adult Health

## 2021-06-26 DIAGNOSIS — Z79899 Other long term (current) drug therapy: Secondary | ICD-10-CM

## 2021-06-26 NOTE — Progress Notes (Signed)
Lab

## 2021-06-28 LAB — TSH: TSH: 0.98 mIU/L (ref 0.40–4.50)

## 2021-06-29 ENCOUNTER — Encounter: Payer: Self-pay | Admitting: Adult Health

## 2021-06-29 ENCOUNTER — Ambulatory Visit (INDEPENDENT_AMBULATORY_CARE_PROVIDER_SITE_OTHER): Payer: Medicare Other | Admitting: Adult Health

## 2021-06-29 DIAGNOSIS — G4700 Insomnia, unspecified: Secondary | ICD-10-CM

## 2021-06-29 DIAGNOSIS — F3132 Bipolar disorder, current episode depressed, moderate: Secondary | ICD-10-CM

## 2021-06-29 DIAGNOSIS — F411 Generalized anxiety disorder: Secondary | ICD-10-CM

## 2021-06-29 DIAGNOSIS — F4310 Post-traumatic stress disorder, unspecified: Secondary | ICD-10-CM

## 2021-06-29 MED ORDER — LORAZEPAM 1 MG PO TABS: ORAL_TABLET | ORAL | 2 refills | Status: DC

## 2021-06-29 MED ORDER — DIVALPROEX SODIUM 250 MG PO DR TAB: 250.0000 mg | DELAYED_RELEASE_TABLET | Freq: Two times a day (BID) | ORAL | 5 refills | Status: DC

## 2021-06-29 NOTE — Progress Notes (Signed)
Tiffany [name] ?[id number] ?1950-07-27 ?71 y.o. ? ?Subjective:  ? ?Patient ID:  Tiffany [name] is a 71 y.o. (DOB [birth year]) female. ? ?Chief Complaint: No chief complaint on file. ? ? ?HPI ?Tiffany [name] presents to the office today for follow-up of PTSD, insomnia, GAD, BPD 1. ? ?Describes mood today as "ok". Pleasant. Tearful at times. Mood symptoms - decreased depression and anxiety - having short "bouts" of it. Increased irritability. Decreased anger. Mood is elevated - feels like she has to move. Singing and dancing while awake - constantly moving - restless. Increased startle reflex - more pronounced. Increased arousal 3 to 4 times a day. Reports mood instability for 5 weeks. Recent family stressors surrounding her daughter. Seeing Dr. Rica Mote weekly. Varying interest and motivation. Taking medications as prescribed. ?Energy levels higher. Active, does not have a regular exercise routine. Working with P/T ?Enjoys some usual interests and activities. Married. Lives with husband their daughter. Talking to family and friends.  ?Appetite adequate. Weight loss.   ?Sleeps well most nights. Averages 4 to 5 hours. ?Focus and concentration stable. Completing tasks. Managing some aspects of household. Retired.  ?Denies SI - fleeting thoughts - on plans ?Denies HI.  ?Denies AH ?Denies VH  ?Reports recent flashbacks. ? ?Previous medications: Celexa, Zyprexa, Tegretol, Depakote, Serzone, Topamax, Seroquel, Effexor, Lexapro, Desipramine, Neurontin, Abilify, Geodon, Propanolol, Cymbalta, Cogentin, Trihexyphenadyl, Sinmmet, Provigil, Selegiline, Requip, Amantadine, Prozac, Mirapex, Azilect, Metoclopramide, Baclofen, Artane, Namenda, Latuda. ? ? ? ?GAD-7   ? ?[place] Office Visit from 02/16/2021 in [place] Visit from 05/14/2019 in [place]  ?Total GAD-7 Score 13 21  ? ?  ? ?Mini-Mental   ? ?[place] Office Visit from 06/07/2019 in [facility]  ?Total  Score (max 30 points ) 29  ? ?  ? ?PHQ2-9   ? ?Flowsheet Row Clinical Support from 04/02/2021 in [place] Office Visit from 02/16/2021 in [place] from 07/26/2020 in [place] Visit from 06/05/2020 in [place] from 03/16/2020 in [place]  ?PHQ-2 Total Score _0 ?PHQ-9 Total Score 1 6 -- 11 3  ? ?  ? ?Flowsheet Row Clinical Support from 04/02/2021 in [place] [facility] from 03/21/2021 in [facility] ED from 02/03/2021 in [facility]  ?C-SSRS RISK CATEGORY No Risk No Risk No Risk  ? ?  ?  ? ?Review of Systems:  ?Review of Systems  ?Musculoskeletal:  Negative for gait problem.  ?Neurological:  Negative for tremors.  ?Psychiatric/Behavioral:    ?     Please refer to HPI  ? ?Medications: I have reviewed the patient's current medications. ? ?Current Outpatient Medications  ?Medication Sig Dispense Refill  ? divalproex (DEPAKOTE) 250 MG DR tablet Take 1 tablet (250 mg total) by mouth 2 (two) times daily. 60 tablet 5  ? Azelaic Acid 15 % gel     ? Certolizumab Pegol (CIMZIA) 2 X 200 MG KIT See admin instructions.    ? Cholecalciferol (VITAMIN D) 50 MCG (2000 UT) tablet     ? cholecalciferol (VITAMIN D3) 25 MCG (1000 UNIT) tablet Take 1,000 Units by mouth daily.    ? lamoTRIgine (LAMICTAL) 200 MG tablet Take 1 tablet (200 mg total) by mouth at bedtime. 90 tablet 3  ? levothyroxine (SYNTHROID) 112 MCG tablet TAKE 1 TABLET BY MOUTH EVERY DAY BEFORE BREAKFAST  90 tablet 0  ? lithium carbonate 300 MG capsule TAKE 1 CAPSULE(300 MG) BY MOUTH AT BEDTIME 90 capsule 3  ? LORazepam (ATIVAN) 1 MG tablet TAKE 1 TABLET(1 MG) BY MOUTH TWICE DAILY AS NEEDED 60 tablet 2  ? metroNIDAZOLE (METROGEL) 0.75 % gel Apply to face 1-2 times daily 45 g 0  ? prazosin (MINIPRESS) 1 MG capsule Take one capsule at bedtime for  two weeks and then discontinue. 14 capsule 0  ? sodium bicarbonate 650 MG tablet Take 650 mg by mouth daily.    ? ?No current facility-administered medications for this visit.  ? ? ?Medication Side Effects: None ? ? ?Allergies:  ?Allergies  ?Allergen Reactions  ? Aripiprazole Other (See Comments)  ?  Parkinsonism ?  ?  ? Lactose Intolerance (Gi) Diarrhea  ? Methotrexate Other (See Comments)  ?  Hair loss, severe stomatitis ? ?  ? Cefdinir Diarrhea  ?  Other reaction(s): Diarrhea ?Yeast infection and fever; negative c diff  ? Etanercept Other (See Comments)  ?  Headaches ? ?  ? Exemestane Other (See Comments)  ?  Suicidal thoughts with medication ? ?  ? Fluoxetine Other (See Comments)  ?  Parkinsonism  ? Lactose   ?  Other reaction(s): diarrhea ?Other reaction(s): diarrhea ?Other reaction(s): diarrhea  ? Methylprednisolone Sodium Succ Other (See Comments)  ?  Agitated mania  ? Epinephrine Palpitations  ?  tachycardia ?  ? Nitrofurantoin Nausea And Vomiting and Rash  ?    ?Other reaction(s): rash, diarrhea ?Other reaction(s): rash, diarrhea ?Other reaction(s): rash, diarrhea ?Other reaction(s): rash, diarrhea ?Other reaction(s): rash, diarrhea ?Other reaction(s): rash, diarrhea  ? ? ?Past Medical History:  ?Diagnosis Date  ? Bipolar 1 disorder ([place])   ? Breast cancer ([place])   ? Chronic diarrhea   ? loose stools twice a day on average for years  ? Chronic kidney disease (CKD), stage III (moderate) (HCC)   ? Depression 1987  ? Family history of breast cancer   ? Hospitalization or health care facility admission within last 6 months 04/2019  ? for fall/seizures  ? Malignant neoplasm of overlapping sites of left breast in female, estrogen receptor positive ([place]) 03/14/2016  ? Dx in 09/2014, s/p bilateral mastectomies and ALND, 0/10 LN. 1.4 cm  Grade I invasive lobular, ER and PR +/Her--, Ki 67 <5% Tried anastrozole for one month, but developed suicidal idea  ? Osteoarthritis, knee 09/24/2019  ? Xray 09/2019  ? Parkinson's  disease ([place]) 2012  ? Personal history of malignant neoplasm of breast   ? Polyposis of colon   ? Psoriatic arthritis ([place])   ? PTSD (post-traumatic stress disorder)   ? Secondary hyperparathyroidism ([place])   ? Tardive dyskinesia   ? Thyroid disease   ? ? ?Past Medical History, Surgical history, Social history, and Family history were reviewed and updated as appropriate.  ? ?Please see review of systems for further details on the patient's review from today.  ? ?Objective:  ? ?Physical Exam:  ?There were no vitals taken for this visit. ? ?Physical Exam ?Constitutional:   ?   General: She is not in acute distress. ?Musculoskeletal:     ?   General: No deformity.  ?Neurological:  ?   Mental Status: She is alert and oriented to person, place, and time.  ?   Coordination: Coordination normal.  ?Psychiatric:     ?   Attention and Perception: Attention and perception normal. She does not perceive auditory or visual hallucinations.     ?  Mood and Affect: Mood normal. Mood is not anxious or depressed. Affect is not labile, blunt, angry or inappropriate.     ?   Speech: Speech normal.     ?   Behavior: Behavior normal.     ?   Thought Content: Thought content normal. Thought content is not paranoid or delusional. Thought content does not include homicidal or suicidal ideation. Thought content does not include homicidal or suicidal plan.     ?   Cognition and Memory: Cognition and memory normal.     ?   Judgment: Judgment normal.  ?   Comments: Insight intact  ? ? ?Lab Review:  ?   ?Component Value Date/Time  ? NA 143 05/22/2021 1044  ? NA 138 06/08/2019 0000  ? K 4.6 05/22/2021 1044  ? CL 111 (H) 05/22/2021 1044  ? CO2 24 05/22/2021 1044  ? GLUCOSE 96 05/22/2021 1044  ? BUN 29 (H) 05/22/2021 1044  ? BUN 29 (A) 06/08/2019 0000  ? CREATININE 2.29 (H) 05/22/2021 1044  ? CALCIUM 9.7 05/22/2021 1044  ? PROT 7.8 03/21/2021 1123  ? ALBUMIN 4.5 03/21/2021 1123  ? AST 15 03/21/2021 1123  ? ALT 12 03/21/2021 1123  ? ALKPHOS 106  03/21/2021 1123  ? BILITOT 1.2 03/21/2021 1123  ? GFRNONAA 22 (L) 03/21/2021 1123  ? GFRAA 25 (L) 09/13/2019 1243  ? ? ?   ?Component Value Date/Time  ? WBC 8.7 03/21/2021 1123  ? RBC 4.64 03/21/2021 1123

## 2021-07-02 ENCOUNTER — Ambulatory Visit (INDEPENDENT_AMBULATORY_CARE_PROVIDER_SITE_OTHER): Payer: Medicare Other | Admitting: Physician Assistant

## 2021-07-02 ENCOUNTER — Encounter: Payer: Self-pay | Admitting: Physician Assistant

## 2021-07-02 ENCOUNTER — Telehealth: Payer: Self-pay | Admitting: Adult Health

## 2021-07-02 VITALS — BP 120/72 | HR 75 | Temp 98.3°F | Ht 64.0 in | Wt 196.2 lb

## 2021-07-02 DIAGNOSIS — R634 Abnormal weight loss: Secondary | ICD-10-CM | POA: Diagnosis not present

## 2021-07-02 DIAGNOSIS — R7309 Other abnormal glucose: Secondary | ICD-10-CM

## 2021-07-02 DIAGNOSIS — Z114 Encounter for screening for human immunodeficiency virus [HIV]: Secondary | ICD-10-CM | POA: Diagnosis not present

## 2021-07-02 DIAGNOSIS — Z7289 Other problems related to lifestyle: Secondary | ICD-10-CM | POA: Diagnosis not present

## 2021-07-02 DIAGNOSIS — F3160 Bipolar disorder, current episode mixed, unspecified: Secondary | ICD-10-CM

## 2021-07-02 DIAGNOSIS — E039 Hypothyroidism, unspecified: Secondary | ICD-10-CM

## 2021-07-02 LAB — CBC WITH DIFFERENTIAL/PLATELET
Basophils Absolute: 0.1 10*3/uL (ref 0.0–0.1)
Basophils Relative: 0.6 % (ref 0.0–3.0)
Eosinophils Absolute: 0.2 10*3/uL (ref 0.0–0.7)
Eosinophils Relative: 1.8 % (ref 0.0–5.0)
HCT: 42 % (ref 36.0–46.0)
Hemoglobin: 13.6 g/dL (ref 12.0–15.0)
Lymphocytes Relative: 25.9 % (ref 12.0–46.0)
Lymphs Abs: 3.2 10*3/uL (ref 0.7–4.0)
MCHC: 32.4 g/dL (ref 30.0–36.0)
MCV: 92.5 fl (ref 78.0–100.0)
Monocytes Absolute: 0.7 10*3/uL (ref 0.1–1.0)
Monocytes Relative: 5.8 % (ref 3.0–12.0)
Neutro Abs: 8 10*3/uL — ABNORMAL HIGH (ref 1.4–7.7)
Neutrophils Relative %: 65.9 % (ref 43.0–77.0)
Platelets: 283 10*3/uL (ref 150.0–400.0)
RBC: 4.54 Mil/uL (ref 3.87–5.11)
RDW: 13.8 % (ref 11.5–15.5)
WBC: 12.2 10*3/uL — ABNORMAL HIGH (ref 4.0–10.5)

## 2021-07-02 LAB — COMPREHENSIVE METABOLIC PANEL
ALT: 14 U/L (ref 0–35)
AST: 13 U/L (ref 0–37)
Albumin: 4.2 g/dL (ref 3.5–5.2)
Alkaline Phosphatase: 125 U/L — ABNORMAL HIGH (ref 39–117)
BUN: 28 mg/dL — ABNORMAL HIGH (ref 6–23)
CO2: 25 mEq/L (ref 19–32)
Calcium: 9.7 mg/dL (ref 8.4–10.5)
Chloride: 109 mEq/L (ref 96–112)
Creatinine, Ser: 2.36 mg/dL — ABNORMAL HIGH (ref 0.40–1.20)
GFR: 20.05 mL/min — ABNORMAL LOW (ref >60.00)
Glucose, Bld: 87 mg/dL (ref 70–99)
Potassium: 4.6 mEq/L (ref 3.5–5.1)
Sodium: 142 mEq/L (ref 135–145)
Total Bilirubin: 1.2 mg/dL (ref 0.2–1.2)
Total Protein: 7.6 g/dL (ref 6.0–8.3)

## 2021-07-02 LAB — URINALYSIS, ROUTINE W REFLEX MICROSCOPIC
Bilirubin Urine: NEGATIVE
Hgb urine dipstick: NEGATIVE
Ketones, ur: NEGATIVE
Leukocytes,Ua: NEGATIVE
Nitrite: NEGATIVE
Specific Gravity, Urine: 1.01 (ref 1.000–1.030)
Urine Glucose: NEGATIVE
Urobilinogen, UA: 0.2 (ref 0.0–1.0)
pH: 6 (ref 5.0–8.0)

## 2021-07-02 LAB — HEMOGLOBIN A1C: Hgb A1c MFr Bld: 5.3 % (ref 4.6–6.5)

## 2021-07-02 NOTE — Progress Notes (Addendum)
Tiffany [name] is a 71 y.o. female here for a follow up for unintentional weight loss.   History of Present Illness:   Chief Complaint  Patient presents with   Weight Loss    Pt is here for f/u, she is down 9 lbs since [name].    HPI   Unintentional Weight Loss Since our previous visit on 04/09/21, Tiffany [name] has continued to monitor her weight and found she is another 9 pounds down. While her weight loss appears to be slow, she is lost as to why she is continuing to lose the weight. Pt states that she overall feels good, but would like to move forward with additional blood work. She is set to see Dr. Lonzo Cloud, endocrinology for further evaluation due to belief that this could be hormonal but is not able to have this visit until October. In the meantime she is interested in an additional referral to endocrinology to see if she can get in with someone quicker.   She does note that with her increase in mania sx, she moves more. She dances and moves to music for a good part of the day. Colonoscopy, Mammogram is UTD.  Bipolar Disorder  In addition to her unintentional weight loss, she has also been experiencing worsening cycling when it comes to her bipolar disorder. Reports that she often cycles between mania and depression fairly quickly, but these episodes have intensified and become longer within the past couple of months. For example, she has noticed that while she usually experiences increased libido, this has become ten times worse. Although this was the case, she has had her medication regimen changed by recently starting depakote 250 mg twice daily and slowly weaning off of her lamictal 200 mg daily and lithium 300 mg nightly.   Upon further discussion she expressed that she eats well but does become highly active. Following this she did admit this could be contributing to her weight loss, but she is unsure. After having a deeper discussion about her mental health, Tiffany [name] admitted that when she  experiences depressive episodes, she does have some SI. While she admits to having the urge to carry out this action sometimes, she explains that she knows she doesn't want to do so.   Despite this she is not under the impression that her mental health has gotten bad enough to be hospitalized and apparently neither does her husband, who is her main support system. In lieu of all of this, she is managing well and is interested in seeing how her new medication regimen may benefit her.    Past Medical History:  Diagnosis Date   Bipolar 1 disorder (HCC)    Breast cancer (HCC)    Chronic diarrhea    loose stools twice a day on average for years   Chronic kidney disease (CKD), stage III (moderate) (HCC)    Depression 1987   Family history of breast cancer    Hospitalization or health care facility admission within last 6 months 04/2019   for fall/seizures   Malignant neoplasm of overlapping sites of left breast in female, estrogen receptor positive (HCC) 03/14/2016   Dx in 09/2014, s/p bilateral mastectomies and ALND, 0/10 LN. 1.4 cm  Grade I invasive lobular, ER and PR +/Her--, Ki 67 <5% Tried anastrozole for one month, but developed suicidal idea   Osteoarthritis, knee 09/24/2019   Xray 09/2019   Parkinson's disease (HCC) 2012   Personal history of malignant neoplasm of breast    Polyposis of colon  Psoriatic arthritis (HCC)    PTSD (post-traumatic stress disorder)    Secondary hyperparathyroidism (HCC)    Tardive dyskinesia    Thyroid disease      Social History   Tobacco Use   Smoking status: Never   Smokeless tobacco: Never  Vaping Use   Vaping Use: Never used  Substance Use Topics   Alcohol use: Never   Drug use: Never    Past Surgical History:  Procedure Laterality Date   ABDOMINAL HYSTERECTOMY  1987   CHOLECYSTECTOMY  1979   DILATION AND CURETTAGE OF UTERUS  1973   MASTECTOMY Bilateral 09/19/2014   TONSILLECTOMY  1970   URETERAL REIMPLANTION Bilateral 1974     Family History  Problem Relation Age of Onset   Lymphoma Mother 63   Lymphoma Sister 83   Breast cancer Sister 45   Colon polyps Sister    Lung cancer Sister        former smoker   Stroke Maternal Grandfather    Diabetes Paternal Grandfather    Colon cancer Neg Hx    Esophageal cancer Neg Hx    Stomach cancer Neg Hx    Rectal cancer Neg Hx     Allergies  Allergen Reactions   Aripiprazole Other (See Comments)    Parkinsonism      Lactose Intolerance (Gi) Diarrhea   Methotrexate Other (See Comments)    Hair loss, severe stomatitis     Cefdinir Diarrhea    Other reaction(s): Diarrhea Yeast infection and fever; negative c diff   Etanercept Other (See Comments)    Headaches     Exemestane Other (See Comments)    Suicidal thoughts with medication     Fluoxetine Other (See Comments)    Parkinsonism   Lactose     Other reaction(s): diarrhea Other reaction(s): diarrhea Other reaction(s): diarrhea   Methylprednisolone Sodium Succ Other (See Comments)    Agitated mania   Epinephrine Palpitations    tachycardia    Nitrofurantoin Nausea And Vomiting and Rash      Other reaction(s): rash, diarrhea Other reaction(s): rash, diarrhea Other reaction(s): rash, diarrhea Other reaction(s): rash, diarrhea Other reaction(s): rash, diarrhea Other reaction(s): rash, diarrhea    Current Medications:   Current Outpatient Medications:    Azelaic Acid 15 % gel, , Disp: , Rfl:    Certolizumab Pegol (CIMZIA) 2 X 200 MG KIT, See admin instructions., Disp: , Rfl:    cholecalciferol (VITAMIN D3) 25 MCG (1000 UNIT) tablet, Take 1,000 Units by mouth daily., Disp: , Rfl:    divalproex (DEPAKOTE) 250 MG DR tablet, Take 1 tablet (250 mg total) by mouth 2 (two) times daily., Disp: 60 tablet, Rfl: 5   lamoTRIgine (LAMICTAL) 200 MG tablet, Take 1 tablet (200 mg total) by mouth at bedtime., Disp: 90 tablet, Rfl: 3   levothyroxine (SYNTHROID) 112 MCG tablet, TAKE 1 TABLET BY MOUTH EVERY  DAY BEFORE BREAKFAST, Disp: 90 tablet, Rfl: 0   lithium carbonate 300 MG capsule, TAKE 1 CAPSULE(300 MG) BY MOUTH AT BEDTIME, Disp: 90 capsule, Rfl: 3   LORazepam (ATIVAN) 1 MG tablet, TAKE 1 TABLET(1 MG) BY MOUTH TWICE DAILY AS NEEDED, Disp: 60 tablet, Rfl: 2   metroNIDAZOLE (METROGEL) 0.75 % gel, Apply to face 1-2 times daily, Disp: 45 g, Rfl: 0   sodium bicarbonate 650 MG tablet, Take 650 mg by mouth daily., Disp: , Rfl:    Review of Systems:   ROS Negative unless otherwise specified per HPI. Vitals:   Vitals:  07/02/21 1006  BP: 120/72  Pulse: 75  Temp: 98.3 F (36.8 C)  TempSrc: Temporal  SpO2: 97%  Weight: 196 lb 4 oz (89 kg)  Height: 5\' 4"  (1.626 m)     Body mass index is 33.69 kg/m.  Physical Exam:   Physical Exam Vitals and nursing note reviewed.  Constitutional:      General: She is not in acute distress.    Appearance: She is well-developed. She is not ill-appearing or toxic-appearing.  Cardiovascular:     Rate and Rhythm: Normal rate and regular rhythm.     Pulses: Normal pulses.     Heart sounds: Normal heart sounds, S1 normal and S2 normal.  Pulmonary:     Effort: Pulmonary effort is normal.     Breath sounds: Normal breath sounds.  Skin:    General: Skin is warm and dry.  Neurological:     Mental Status: She is alert.     GCS: GCS eye subscore is 4. GCS verbal subscore is 5. GCS motor subscore is 6.  Psychiatric:        Speech: Speech normal.        Behavior: Behavior normal. Behavior is cooperative.    Assessment and Plan:   Unintentional weight loss Ongoing Could be related to increased energy expenditure with ongoing mania however will update labs, UA, CXR, and make recommendations accordingly  Follow up in a month, sooner if concerns occur  Other abnormal glucose Update labs, will make recommendations accordingly   Encounter for screening for human immunodeficiency virus (HIV)  - HIV Antibody  Other problems related to lifestyle;  Bipolar Disorder Uncontrolled; reviewed with patient Denies current SI/HI Mgmt per psych I discussed with patient that if they develop any SI, to tell someone immediately and seek medical attention. I did provide her with information for [facility] in case this was needed.  I,Havlyn C Ratchford,acting as a Neurosurgeon for [employer], PA.,have documented all relevant documentation on the behalf of Jarold Motto, PA,as directed by  Jarold Motto, PA while in the presence of Jarold Motto, [place].  I, Jarold Motto, [place], have reviewed all documentation for this visit. The documentation on 07/02/21 for the exam, diagnosis, procedures, and orders are all accurate and complete.   Jarold Motto, PA-C

## 2021-07-02 NOTE — Patient Instructions (Signed)
It was great to see you! ? ?Adding new referral to different endocrinologist to see if we can get you in sooner at [facility] -- Dr. Nehemiah Settle ? ?Update blood work today ? ?An order for an xray has been put in for you. ?To get your xray, you can walk in at the [facility] location without a scheduled appointment.  ?The address is [address] It is across the street from [facility] ?X-ray is located in the basement.  ?Hours of operation are M-F 8:30am to 5:00pm. Please note that they are closed for lunch between 12:30 and 1:00pm. ? ?If you develop suicidal thoughts, please tell someone and immediately proceed to our local 24/7 crisis center, [facility] at the [facility]. ?[address] [place], [place] [address] ?[phone]. ? ?Let's follow-up in 1 month, sooner if you have concerns. ? ?If a referral was placed today, you will be contacted for an appointment. Please note that routine referrals can sometimes take up to 3-4 weeks to process. Please call our office if you haven't heard anything after this time frame. ? ?Take care, ? ?Inda Coke PA-C  ?

## 2021-07-02 NOTE — Telephone Encounter (Signed)
Patient wanted to verify how to take Depakote. Rx was for 1 BID.  ?

## 2021-07-02 NOTE — Telephone Encounter (Signed)
Next visit is 07/20/21. Tiffany [name] called wanting to speak to someone about her medication. Her phone number is [phone]. ?

## 2021-07-03 LAB — HEPATITIS C ANTIBODY
Hepatitis C Ab: NONREACTIVE
SIGNAL TO CUT-OFF: 0.04 (ref <1.00)

## 2021-07-03 LAB — HIV ANTIBODY (ROUTINE TESTING W REFLEX): HIV 1&2 Ab, 4th Generation: NONREACTIVE

## 2021-07-05 ENCOUNTER — Ambulatory Visit (INDEPENDENT_AMBULATORY_CARE_PROVIDER_SITE_OTHER): Payer: Medicare Other | Admitting: Psychiatry

## 2021-07-05 DIAGNOSIS — F3160 Bipolar disorder, current episode mixed, unspecified: Secondary | ICD-10-CM | POA: Diagnosis not present

## 2021-07-05 DIAGNOSIS — U099 Post covid-19 condition, unspecified: Secondary | ICD-10-CM

## 2021-07-05 DIAGNOSIS — Z8669 Personal history of other diseases of the nervous system and sense organs: Secondary | ICD-10-CM

## 2021-07-05 DIAGNOSIS — E039 Hypothyroidism, unspecified: Secondary | ICD-10-CM | POA: Diagnosis not present

## 2021-07-05 DIAGNOSIS — F4010 Social phobia, unspecified: Secondary | ICD-10-CM | POA: Diagnosis not present

## 2021-07-05 DIAGNOSIS — F4310 Post-traumatic stress disorder, unspecified: Secondary | ICD-10-CM

## 2021-07-05 DIAGNOSIS — R41840 Attention and concentration deficit: Secondary | ICD-10-CM
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
## 2021-07-06 ENCOUNTER — Telehealth: Payer: Self-pay | Admitting: Adult Health

## 2021-07-06 NOTE — Telephone Encounter (Signed)
Next visit is 07/20/21. Tiffany [name] called and said she was put on Depakote and has noticed she is off balance, dizzy and stumbling. Please call her at [phone]. Pharmacy is: ? ?[id number] - [place], [place] - [address] ? ? ?

## 2021-07-06 NOTE — Telephone Encounter (Signed)
Will call and speak with patient.

## 2021-07-06 NOTE — Telephone Encounter (Signed)
See message from patient. She relates sx to starting Depakote.  She takes 1 AM and 1 PM with food. She is also having vision problems. She is sleeping well. She said she doesn't have anxiety because she feels blah from the medication.  ?

## 2021-07-07 ENCOUNTER — Encounter: Payer: Self-pay | Admitting: Physician Assistant

## 2021-07-09 ENCOUNTER — Encounter: Payer: Self-pay | Admitting: Physician Assistant

## 2021-07-09 ENCOUNTER — Ambulatory Visit (INDEPENDENT_AMBULATORY_CARE_PROVIDER_SITE_OTHER): Payer: Medicare Other | Admitting: Psychiatry

## 2021-07-09 ENCOUNTER — Ambulatory Visit: Payer: Medicare Other | Admitting: Physician Assistant

## 2021-07-09 ENCOUNTER — Ambulatory Visit
Admission: RE | Admit: 2021-07-09 | Discharge: 2021-07-09 | Disposition: A | Discharge status: Home / Self Care | Payer: Medicare Other | Source: Ambulatory Visit | Attending: Physician Assistant | Admitting: Physician Assistant

## 2021-07-09 DIAGNOSIS — Z634 Disappearance and death of family member: Secondary | ICD-10-CM

## 2021-07-09 DIAGNOSIS — F4010 Social phobia, unspecified: Secondary | ICD-10-CM

## 2021-07-09 DIAGNOSIS — N184 Chronic kidney disease, stage 4 (severe): Secondary | ICD-10-CM

## 2021-07-09 DIAGNOSIS — E039 Hypothyroidism, unspecified: Secondary | ICD-10-CM

## 2021-07-09 DIAGNOSIS — F4310 Post-traumatic stress disorder, unspecified: Secondary | ICD-10-CM

## 2021-07-09 DIAGNOSIS — R69 Illness, unspecified: Secondary | ICD-10-CM

## 2021-07-09 DIAGNOSIS — Z8669 Personal history of other diseases of the nervous system and sense organs: Secondary | ICD-10-CM

## 2021-07-09 DIAGNOSIS — F3160 Bipolar disorder, current episode mixed, unspecified: Secondary | ICD-10-CM | POA: Diagnosis not present

## 2021-07-09 DIAGNOSIS — Z638 Other specified problems related to primary support group: Secondary | ICD-10-CM

## 2021-07-09 DIAGNOSIS — Z853 Personal history of malignant neoplasm of breast: Secondary | ICD-10-CM

## 2021-07-09 DIAGNOSIS — R634 Abnormal weight loss: Secondary | ICD-10-CM

## 2021-07-09 NOTE — Progress Notes (Addendum)
Psychotherapy Progress Note [facility] Tiffany Moore, PhD LP  Patient ID: Tiffany [name])    MRN: [id number] Therapy format: Individual psychotherapy Date: 07/09/2021      Start: 1:10p     Stop: 2:15p     Time Spent: 65 min Location: In-person   Session narrative (presenting needs, interim history, self-report of stressors and symptoms, applications of prior therapy, status changes, and interventions made in session) Seen last week with Tiffany [name] part time, noted family concerns for breakthrough mania and patient concerns for breakthrough SI and demonstrative gestures.  EHR notes med advice call day after seen for dizziness/balance SE attrib to Depakote.    Arrives playing [place] on her phone, has to let it finish before engaging.  Feels like a lot going on since last met 4 days ago.  Has felt more rapid cycling, labile, so started establishing Depakote, but SE got communicated to psychiatrist and is now off Depakote, though still off Lamictal, too, having dialed it back to prevent SJS risk.  Admittedly concerned for lack of mood stabilizer at this point, but has been very reluctant to go to hospital based on prior experience of strip search, being put on "wrong unit" (actively psychotic), and 2 days on a gurney in ER.  Admits she has thrown a couple things in frustration this weekend.    With consent and explanation, messaged psychiatrist in session seeking guidance about restoring mood stabilizer.  In the midst of the attempt, PT had an unexpected outburst accusing Tiffany [name] of "talking about me".  Explained again seeking guidance if available to get more quickly to psychiatrist's directions and clarification, comprehended and accepted.  Unable to mount a sufficient conversation with psychiatrist, referred patient back to deal directly and encouraged to make sure she is as clear about her own concerns with her as she has been here.  Meanwhile, challenged to form a family  support plan so that family have more options and guidance what to do to help cope with mood cycling and SI if/when it flares.  Emphasized full and unwavering willingness to reveal to family members, to self-remind that dark moods are temporary and "they don't know everything", and if it's honestly worth dying, that can happen later.  Agreed to use and listed coping behaviors (music, call Tiffany [name], have Tiffany [name]/Tiffany [name] hold her, ride together/change of scenery, expressive writing, prayer, figure out a place to interact with dogs, interact with any living thing including plants).    Admits she has thought often of Tiffany [name], who died, and that she has recently reached out unsatisfyingly to Tiffany [name], who only pumped the power of positive thinking in a way that made her feel more lonely, initially.  Healthy realization that Tiffany [name] is probably untreated Bipolar herself and let go any demand she be of help.  Therapeutic modalities: Cognitive Behavioral Therapy, Solution-Oriented/Positive Psychology, and Ego-Supportive  Mental Status/Observations:  Appearance:   Casual and Neat     Behavior:  Appropriate  Motor:  Normal and initial upper-body dancing  with music until stopped  Speech/Language:   Clear and Coherent  Affect:  Mostly appropriate, labile at times  Mood:  mixed  Thought process:  normal  Thought content:    Worry, grief  Sensory/Perceptual disturbances:    WNL  Orientation:  Fully oriented  Attention:  Good    Concentration:  Fair  Memory:  grossly intact  Insight:    Good  Judgment:   Good  Impulse Control:  Fair  Risk Assessment: Danger to Self:  persistent, intrusive thoughts of death  Self-injurious Behavior: No Danger to Others: No Physical Aggression / Violence: No Duty to Warn: No Access to Firearms a concern: No  Assessment of progress:   guarded  Diagnosis:   ICD-10-CM   1. Bipolar affective disorder, mixed ([place])  F31.60     2. PTSD (post-traumatic stress disorder)   F43.10     3. Social anxiety disorder  F40.10     4. Bereavement  Z63.4     5. Acquired hypothyroidism  E03.9     6. History of small, chronic left frontal infarct, as assessed by neurologist  Z86.69     7. History of metabolic encephalopathy  Q44.71     8. History of breast cancer left 2016  Z85.3     9. CKD (chronic kidney disease) stage 4, GFR 15-29 ml/min (HCC) by history  N18.4     10. r/o pseudobulbar affect vs neurological and psychogenic disinhibition of affect  R69     11. Relationship problem with family member  Z63.8      Plan:  Address with psychiatry problems of inconvenient libido, mixed manic symptoms and mood lability -- full disclosure Coping and anti-suicide coping plan as outlined above.  Maintain safety plan including voice the need for safety with others and continue medication supply and any other lethal means under husband's control. Still follow through on endocrinology referral Monitor for any escalating signs of PTSD, consider retry alpha-blocker if needed, since previous trial seems to have ended based on mistaken self-report Monitor for increased mood lability as either sign of PTSD, cycling mood, pseudobulbar issue, or combination Continue self-paced exercise program as intended for overall health, fitness, balance, etc  Other recommendations/advice as may be noted above Continue to utilize previously learned skills ad lib Maintain medication as prescribed and work faithfully with relevant prescriber(s) if any changes are desired or seem indicated Call the clinic on-call service, 988/hotline, 911, or present to [facility] or ER if any life-threatening psychiatric crisis Return in about 1 week (around 07/16/2021). Already scheduled visit in this office 07/16/2021.  Tiffany Serve, PhD Tiffany Moore, PhD LP Clinical Psychologist, [facility] [address] [place], [place] [address] [phone]

## 2021-07-10 NOTE — Telephone Encounter (Signed)
Please see message and advise 

## 2021-07-11 ENCOUNTER — Ambulatory Visit (INDEPENDENT_AMBULATORY_CARE_PROVIDER_SITE_OTHER): Payer: Medicare Other | Admitting: Psychiatry

## 2021-07-11 DIAGNOSIS — N184 Chronic kidney disease, stage 4 (severe): Secondary | ICD-10-CM

## 2021-07-11 DIAGNOSIS — F4010 Social phobia, unspecified: Secondary | ICD-10-CM

## 2021-07-11 DIAGNOSIS — Z86010 Personal history of colonic polyps: Secondary | ICD-10-CM

## 2021-07-11 DIAGNOSIS — Z634 Disappearance and death of family member: Secondary | ICD-10-CM | POA: Diagnosis not present

## 2021-07-11 DIAGNOSIS — F4310 Post-traumatic stress disorder, unspecified: Secondary | ICD-10-CM | POA: Diagnosis not present

## 2021-07-11 DIAGNOSIS — Z853 Personal history of malignant neoplasm of breast: Secondary | ICD-10-CM

## 2021-07-11 DIAGNOSIS — F3160 Bipolar disorder, current episode mixed, unspecified: Secondary | ICD-10-CM | POA: Diagnosis not present

## 2021-07-11 DIAGNOSIS — U099 Post covid-19 condition, unspecified: Secondary | ICD-10-CM

## 2021-07-11 DIAGNOSIS — G2401 Drug induced subacute dyskinesia: Secondary | ICD-10-CM

## 2021-07-11 DIAGNOSIS — E039 Hypothyroidism, unspecified: Secondary | ICD-10-CM

## 2021-07-11 DIAGNOSIS — R69 Illness, unspecified: Secondary | ICD-10-CM

## 2021-07-11 DIAGNOSIS — R41840 Attention and concentration deficit: Secondary | ICD-10-CM

## 2021-07-11 DIAGNOSIS — Z8669 Personal history of other diseases of the nervous system and sense organs: Secondary | ICD-10-CM

## 2021-07-11 NOTE — Progress Notes (Signed)
Psychotherapy Progress Note [facility] [name], PhD LP  Patient ID: HARRIETTE TOVEY)    MRN: [id number] Therapy format: Family therapy w/ patient -- accompanied by [name] Date: 07/11/2021      Start: 11:05a     Stop: 12:02p     Time Spent: 57 min Location: In-person   Session narrative (presenting needs, interim history, self-report of stressors and symptoms, applications of prior therapy, status changes, and interventions made in session) Seen 2 days ago, noted experience of possibly more intense cycling, murky understanding of med directions, and question of what dosing and timing are appropriate given unexpected results of her complex changes in mood stabilizer regimen.  Noted Monday limitations in PT's working memory for psychiatry's instructions and facilitated readiness to take psychiatrist's intended call yesterday after an on-call decision to detox Depakote.  Noted another labile reaction in last session concerning the attempt to communicate with psychiatrist in session and transient paranoia being "talked about", when she presumably understood I was attempting to get prompt medication advice on her behalf.  With some fragmentation, attempt last time to plot more of a suicide prevention plan that included the challenge to be more forthcoming and more collaborative with her family at home, and to implement better self-awareness of mood states and willingness to try self-soothing behaviors and change of focus to cope rather than allow herself to ruminate on dramatic behaviors (e.g., painting "DNR" and overdosing).    Met in waiting room, conferring with psychiatrist, in fact.  Turns out psychiatry has been in daily touch for 5 days now, unspoken by PT and unbeknownst to Coffee.  Current plan is to go to 138m lamotrigine 3 days then to 2070m  Today admits poor memory for recent events, despite cogent self-presentation, and having gathered meds for OD last night,  actually.  Admits she didn't inform her family on Monday, instead yesterday packed up her journals, including Monday's notes, and just put them all in the garbage, in what sounds like a semidissociative rebellious mood.  WaDuard Bradyound her med stockpile this morning and wisely impounded meds for safety, then dutifully took out the garbage bags as requested without question.  Turns out Benadryl was her intended OD, after happening to read a story about a child's accidental death taking it.    Assessed suicidality today, sincerely reports willing to live and admits she is relieved to have been discovered and managed.  Says it's a wakeup call about the severity of her mood disorder.  Also acknowledges she texted [name] in the evening to let him know she felt suicidal, dutifully following through on her safety pledge.  Impressed that he was alert enough to find her stash.  Affirmed and encouraged WaDuard Bradyn doing so, and verified that he feels up to the task of managing lethal means until we see enduring mood stability.  Validated her need to find out if family notice enough and are willing to get involved and encouraged to be sure she doesn't let suicidal feelings create scenarios to test whether she matters.  Resolved she's open to asking for help, and promptly, and open to caring inquiry about her feelings, especially with daughters, including CaHoyle Sauerwho was informed of suicidality but reportedly made no effort to talk to her directly.   Encouraged that if she wants to improve on that with Carolyn,she can speak directly with her to let her know it would be welcome and helpful if she would engage her directly in the  future, meanwhile empower wally to communicate same.  Therapeutic modalities: Cognitive Behavioral Therapy, Solution-Oriented/Positive Psychology, Customer service manager, and Faith-sensitive  Mental Status/Observations:  Appearance:   Casual and Neat     Behavior:  Appropriate  Motor:  Normal   Speech/Language:   Clear and Coherent  Affect:  Appropriate  Mood:  anxious and dysthymic  Thought process:  Normal on observation  Thought content:    WNL  Sensory/Perceptual disturbances:    WNL  Orientation:  grossly intact  Attention:  Good    Concentration:  Good  Memory:  grossly intact  Insight:    Good  Judgment:   Good  Impulse Control:  Variable   Risk Assessment: Danger to Self:  intrusive SI w/o intent  Self-injurious Behavior: No Danger to Others: No Physical Aggression / Violence: No Duty to Warn: No Access to Firearms a concern: No  Assessment of progress:  situational setback(s)  Diagnosis:   ICD-10-CM   1. Bipolar affective disorder, mixed ([place])  F31.60     2. PTSD (post-traumatic stress disorder)  F43.10     3. Social anxiety disorder  F40.10     4. Bereavement  Z63.4     5. Acquired hypothyroidism  E03.9      Plan:  Coping and anti-suicide coping plan as outlined above.  Firm commitment to safety and reaching out further as needed.  Maintain safety plan including voice the need for safety with others and continue medication supply and any other lethal means under husband's control.   For the time being, [name] remain in control of medication supply to better prevent impulsive OD, including OTC Benadryl and others. If possible, retrieve journals from trash Rewrite list of things she can do/ask for self-soothing and weathering dark moods, brainstorm together, bring with next time Endorsement to ask daughters, as moved, if they would ask her how she's feeling, in service of practicing assertiveness and of helping break down the impression depression gives her that she either is, or should be, ignorable/forgettable Address with psychiatry problems of inconvenient libido, mixed manic symptoms and mood lability -- full disclosure Still follow through on endocrinology referral Monitor for any escalating signs of PTSD, consider retry alpha-blocker if needed,  since previous trial seems to have ended based on mistaken self-report Monitor for increased mood lability as either sign of PTSD, cycling mood, pseudobulbar issue, or combination Continue self-paced exercise program as intended for overall health, fitness, balance, etc  Other recommendations/advice as may be noted above Continue to utilize previously learned skills ad lib Maintain medication as prescribed and work faithfully with relevant prescriber(s) if any changes are desired or seem indicated Call the clinic on-call service, 988/hotline, 911, or present to [facility] or ER if any life-threatening psychiatric crisis Return for session(s) already scheduled, within a week. Already scheduled visit in this office 07/16/2021.  [name], PhD [name], PhD LP Clinical Psychologist, [facility] [address] [place], [place] [address] [phone]

## 2021-07-12 ENCOUNTER — Ambulatory Visit: Payer: Medicare Other | Admitting: Adult Health

## 2021-07-12 ENCOUNTER — Ambulatory Visit (INDEPENDENT_AMBULATORY_CARE_PROVIDER_SITE_OTHER)
Admission: RE | Admit: 2021-07-12 | Discharge: 2021-07-12 | Disposition: A | Discharge status: Home / Self Care | Payer: Medicare Other | Source: Ambulatory Visit | Attending: Physician Assistant | Admitting: Physician Assistant

## 2021-07-12 DIAGNOSIS — R634 Abnormal weight loss: Secondary | ICD-10-CM

## 2021-07-13 ENCOUNTER — Telehealth: Payer: Self-pay | Admitting: Adult Health

## 2021-07-13 ENCOUNTER — Other Ambulatory Visit: Payer: Self-pay | Admitting: Adult Health

## 2021-07-13 DIAGNOSIS — G4700 Insomnia, unspecified: Secondary | ICD-10-CM

## 2021-07-13 MED ORDER — TRAZODONE HCL 50 MG PO TABS: ORAL_TABLET | ORAL | 2 refills | Status: DC

## 2021-07-13 NOTE — Telephone Encounter (Signed)
Called and spoke with patient.

## 2021-07-13 NOTE — Telephone Encounter (Signed)
Patient says she is having trouble getting to sleep and staying asleep. She has tea during the day and I told her no caffeine use after 3:00. She said she usually goes to bed at the same time nightly. She is not taking anything OTC, but has tried Benadryl in the past w/o benefit. She is taking Ativan and it does not help with sleep. She didn't provide details but said she had had increased stressors/anxiety that she felt could be contributing to insomnia. She does see Jonni Sanger. She feels her depression has increased since last medication change. She does have an appt with you next Friday and an appt with Jonni Sanger on Monday.  ?

## 2021-07-13 NOTE — Telephone Encounter (Signed)
Tiffany [name] called this morning at 9:15 to report that she is not sleeping well.  Is there something you can prescribe to help?  Appt 07/16/21. Please call to discuss.  Pharmacy is [facility] in [place]. ?

## 2021-07-16 ENCOUNTER — Ambulatory Visit (INDEPENDENT_AMBULATORY_CARE_PROVIDER_SITE_OTHER): Payer: Medicare Other | Admitting: Psychiatry

## 2021-07-16 DIAGNOSIS — F4310 Post-traumatic stress disorder, unspecified: Secondary | ICD-10-CM

## 2021-07-16 DIAGNOSIS — N184 Chronic kidney disease, stage 4 (severe): Secondary | ICD-10-CM

## 2021-07-16 DIAGNOSIS — F3160 Bipolar disorder, current episode mixed, unspecified: Secondary | ICD-10-CM | POA: Diagnosis not present

## 2021-07-16 DIAGNOSIS — F4010 Social phobia, unspecified: Secondary | ICD-10-CM | POA: Diagnosis not present

## 2021-07-16 DIAGNOSIS — Z8669 Personal history of other diseases of the nervous system and sense organs: Secondary | ICD-10-CM

## 2021-07-16 DIAGNOSIS — Z853 Personal history of malignant neoplasm of breast: Secondary | ICD-10-CM

## 2021-07-16 NOTE — Progress Notes (Signed)
Psychotherapy Progress Note [facility] [name], PhD LP  Patient ID: KESA BIRKY)    MRN: [id number] Therapy format: Family therapy w/ patient -- accompanied by [name] Date: 07/16/2021      Start: 10:08a     Stop: 10:58a     Time Spent: 50 min Location: In-person   Session narrative (presenting needs, interim history, self-report of stressors and symptoms, applications of prior therapy, status changes, and interventions made in session) Since last visit, phone consultation noted expressing increased depression and insomnia, veiled suggestion she is more in conflict about will to live and allowing more family involvement in her safety at home.    In person, says they were able to recover the journals, then she found them too evocative and set aside.  Did recover anti-suicide plan, but has felt ignorable by family, even though [name] and [name] contacted the other kids, who were interested.  Did get earlier endocrinology appt with a secondary choice, partly on [name]'s prompting.  In speaking more frankly with Octavia Bruckner, acknowledged amazement that she didn't reach out in prayer.  Acknowledges she's not really "over" feeling suicidal.  Facing temptations lately to go to the drug store and get a new stash of Benadryl.  [name] and [name] have impounded her keys as well as her meds, so the means are controlled for now.  Agrees it's the prudent thing.  Probed her experience transitioning off PT, which seemed to have provided a lot of social support, purpose, and hope.  Has felt awkward to her to try out self-service machines, so she begged off, which has effectively let her deteriorate some physically.  Has been more withdrawn at church, too, for fear of showing tears.  Acknowledges she has not particularly made friends here yet in the time since moving from [place].    Discussed possibilities of finding a support group (arthritis, grief, or dep/bipolar), or digging into a church  friendship that has promise to be more authentic and real.    Acknowledges a "troubling awareness" of hormonal effects on moods, says she still has some inconvenient sexual arousal, though less than she did.  Reminded she is free to express and pursue it with her husband, and that it can be a positive indicator.  Therapeutic modalities: Cognitive Behavioral Therapy, Solution-Oriented/Positive Psychology, and Ego-Supportive  Mental Status/Observations:  Appearance:   Casual and Neat     Behavior:  Appropriate  Motor:  Normal  Speech/Language:   Clear and Coherent  Affect:  Constricted  Mood:  depressed  Thought process:  normal  Thought content:    grossly intact, intrusive suicidal ideas  Sensory/Perceptual disturbances:    WNL  Orientation:  Fully oriented  Attention:  Good    Concentration:  Good  Memory:  grossly intact  Insight:    Good  Judgment:   Good  Impulse Control:  Fair   Risk Assessment: Danger to Self: SI with idea, pledge to refrain Self-injurious Behavior: No Danger to Others: No Physical Aggression / Violence: No Duty to Warn: No Access to Firearms a concern: No  Assessment of progress:  stabilized  Diagnosis:   ICD-10-CM   1. Bipolar affective disorder, mixed ([place])  F31.60     2. PTSD (post-traumatic stress disorder)  F43.10     3. Social anxiety disorder  F40.10     4. CKD (chronic kidney disease) stage 4, GFR 15-29 ml/min (HCC) by history  N18.4     5. History of breast cancer  left 2016  Z85.3     6. History of metabolic encephalopathy  B63.84      Plan:  Maintain anti-suicide plan including family members holding onto lethal means and car keys for now Continue looking into endocrine issue, still suspect thyroid treatment could be overdoing it a bit Re-approach the gym, soon as able, and avail herself of the help promised there.  Social contact and being able to move better physically will be mood support and will to live Other  recommendations/advice as may be noted above Continue to utilize previously learned skills ad lib Maintain medication as prescribed and work faithfully with relevant prescriber(s) if any changes are desired or seem indicated Call the clinic on-call service, 988/hotline, 911, or present to [facility] or ER if any life-threatening psychiatric crisis Return in about 1 week (around 07/23/2021). Already scheduled visit in this office 07/20/2021.  [name], PhD [name], PhD LP Clinical Psychologist, [facility] [address] [place], [place] [address] [phone]

## 2021-07-19 ENCOUNTER — Telehealth: Payer: Self-pay

## 2021-07-19 NOTE — Chronic Care Management (AMB) (Signed)
?  Care Management  ? ?Note ? ?07/19/2021 ?Name: Tiffany [name] MRN: [id number] DOB: 23-May-1950 ? ?[name] is a 71 y.o. year old female who is a primary care patient of Inda Coke, [place]. I reached out to [place] by phone today offer care coordination services.  ? ?Ms. Obyrne was given information about care management services today including:  ?Care management services include personalized support from designated clinical staff supervised by her physician, including individualized plan of care and coordination with other care providers ?24/7 contact phone numbers for assistance for urgent and routine care needs. ?The patient may stop care management services at any time by phone call to the office staff. ? ?Patient agreed to services and verbal consent obtained.  ? ?Follow up plan: ?Telephone appointment with care management team member scheduled for:08/16/2021 ? ?Noreene Larsson, RMA ?Care Guide, Embedded Care Coordination ?[facility]  ?[place], [place] [address] ?Direct Dial: [phone] ?[email or web address] ?Website: [email or web address]   ?

## 2021-07-20 ENCOUNTER — Telehealth (INDEPENDENT_AMBULATORY_CARE_PROVIDER_SITE_OTHER): Payer: Medicare Other | Admitting: Adult Health

## 2021-07-20 ENCOUNTER — Encounter: Payer: Self-pay | Admitting: Adult Health

## 2021-07-20 DIAGNOSIS — F4310 Post-traumatic stress disorder, unspecified: Secondary | ICD-10-CM

## 2021-07-20 DIAGNOSIS — G4700 Insomnia, unspecified: Secondary | ICD-10-CM | POA: Diagnosis not present

## 2021-07-20 DIAGNOSIS — F3132 Bipolar disorder, current episode depressed, moderate: Secondary | ICD-10-CM

## 2021-07-20 DIAGNOSIS — F411 Generalized anxiety disorder: Secondary | ICD-10-CM | POA: Diagnosis not present

## 2021-07-20 NOTE — Progress Notes (Signed)
Tiffany [name] ?[id number] ?[birth year] ?71 y.o. ? ?Virtual Visit via Video Note ? ?I connected with pt @ on 07/20/21 at 10:20 AM EDT by a video enabled telemedicine application and verified that I am speaking with the correct person using two identifiers. ?  ?I discussed the limitations of evaluation and management by telemedicine and the availability of in person appointments. The patient expressed understanding and agreed to proceed. ? ?I discussed the assessment and treatment plan with the patient. The patient was provided an opportunity to ask questions and all were answered. The patient agreed with the plan and demonstrated an understanding of the instructions. ?  ?The patient was advised to call back or seek an in-person evaluation if the symptoms worsen or if the condition fails to improve as anticipated. ? ?I provided 25 minutes of non-face-to-face time during this encounter.  The patient was located at home.  The provider was located at [place]. ? ? ?Tiffany Gell, NP  ? ?Subjective:  ? ?Patient ID:  Tiffany [name] is a 71 y.o. (DOB [birth year]) female. ? ?Chief Complaint: No chief complaint on file. ? ? ?HPI ?Tiffany [name] presents for follow-up of PTSD, insomnia, GAD, BPD 1. ? ?Describes mood today as "ok". Pleasant. Tearful at times. Mood symptoms - feels depressed most of the time. Denies anxiety. Irritable at times - "not over the ast few days". Stating "I'm doing better". Reports cycling in and out of mania and depression throughout the day - a few hours at a time. Mood improved overall. Not feeling as restless. Seeing Dr. Rica Mote weekly. Varying interest and motivation. Taking medications as prescribed. ?Energy levels vary throughout the day. Active, does not have a regular exercise routine. Walking around neighborhood a few times a day. ?Enjoys some usual interests and activities. Married. Lives with husband their daughter. Talking to family and friends.  ?Appetite adequate. Weight loss.    ?Sleeping better some nights than others. Averages 4 to 5 hours of broken sleep. ?Focus and concentration improved. Completing tasks. Managing some aspects of household. Retired.  ?Denies SI. ?Denies HI.  ?Denies AH ?Denies VH  ?Reports recent flashbacks. ? ?Previous medications: Celexa, Zyprexa, Tegretol, Depakote, Serzone, Topamax, Seroquel, Effexor, Lexapro, Desipramine, Neurontin, Abilify, Geodon, Propanolol, Cymbalta, Cogentin, Trihexyphenadyl, Sinmmet, Provigil, Selegiline, Requip, Amantadine, Prozac, Mirapex, Azilect, Metoclopramide, Baclofen, Artane, Namenda, Latuda. ? ? ? ?Review of Systems:  ?Review of Systems  ?Musculoskeletal:  Negative for gait problem.  ?Neurological:  Negative for tremors.  ?Psychiatric/Behavioral:    ?     Please refer to HPI  ? ?Medications: I have reviewed the patient's current medications. ? ?Current Outpatient Medications  ?Medication Sig Dispense Refill  ? Azelaic Acid 15 % gel     ? Certolizumab Pegol (CIMZIA) 2 X 200 MG KIT See admin instructions.    ? cholecalciferol (VITAMIN D3) 25 MCG (1000 UNIT) tablet Take 1,000 Units by mouth daily.    ? lamoTRIgine (LAMICTAL) 200 MG tablet Take 1 tablet (200 mg total) by mouth at bedtime. 90 tablet 3  ? levothyroxine (SYNTHROID) 112 MCG tablet TAKE 1 TABLET BY MOUTH EVERY DAY BEFORE BREAKFAST 90 tablet 0  ? lithium carbonate 300 MG capsule TAKE 1 CAPSULE(300 MG) BY MOUTH AT BEDTIME 90 capsule 3  ? LORazepam (ATIVAN) 1 MG tablet TAKE 1 TABLET(1 MG) BY MOUTH TWICE DAILY AS NEEDED 60 tablet 2  ? metroNIDAZOLE (METROGEL) 0.75 % gel Apply to face 1-2 times daily 45 g 0  ? sodium bicarbonate 650 MG tablet  Take 650 mg by mouth daily.    ? traZODone (DESYREL) 50 MG tablet Take one to two tablets at bedtime. 60 tablet 2  ? ?No current facility-administered medications for this visit.  ? ? ?Medication Side Effects: None ? ?Allergies:  ?Allergies  ?Allergen Reactions  ? Aripiprazole Other (See Comments)  ?  Parkinsonism ?  ?  ? Lactose  Intolerance (Gi) Diarrhea  ? Methotrexate Other (See Comments)  ?  Hair loss, severe stomatitis ? ?  ? Cefdinir Diarrhea  ?  Other reaction(s): Diarrhea ?Yeast infection and fever; negative c diff  ? Etanercept Other (See Comments)  ?  Headaches ? ?  ? Exemestane Other (See Comments)  ?  Suicidal thoughts with medication ? ?  ? Fluoxetine Other (See Comments)  ?  Parkinsonism  ? Lactose   ?  Other reaction(s): diarrhea ?Other reaction(s): diarrhea ?Other reaction(s): diarrhea  ? Methylprednisolone Sodium Succ Other (See Comments)  ?  Agitated mania  ? Epinephrine Palpitations  ?  tachycardia ?  ? Nitrofurantoin Nausea And Vomiting and Rash  ?    ?Other reaction(s): rash, diarrhea ?Other reaction(s): rash, diarrhea ?Other reaction(s): rash, diarrhea ?Other reaction(s): rash, diarrhea ?Other reaction(s): rash, diarrhea ?Other reaction(s): rash, diarrhea  ? ? ?Past Medical History:  ?Diagnosis Date  ? Bipolar 1 disorder ([place])   ? Breast cancer ([place])   ? Chronic diarrhea   ? loose stools twice a day on average for years  ? Chronic kidney disease (CKD), stage III (moderate) (HCC)   ? Depression 1987  ? Family history of breast cancer   ? Hospitalization or health care facility admission within last 6 months 04/2019  ? for fall/seizures  ? Malignant neoplasm of overlapping sites of left breast in female, estrogen receptor positive ([place]) 03/14/2016  ? Dx in 09/2014, s/p bilateral mastectomies and ALND, 0/10 LN. 1.4 cm  Grade I invasive lobular, ER and PR +/Her--, Ki 67 <5% Tried anastrozole for one month, but developed suicidal idea  ? Osteoarthritis, knee 09/24/2019  ? Xray 09/2019  ? Parkinson's disease ([place]) 2012  ? Personal history of malignant neoplasm of breast   ? Polyposis of colon   ? Psoriatic arthritis ([place])   ? PTSD (post-traumatic stress disorder)   ? Secondary hyperparathyroidism ([place])   ? Tardive dyskinesia   ? Thyroid disease   ? ? ?Family History  ?Problem Relation Age of Onset  ? Lymphoma Mother 14  ?  Lymphoma Sister 47  ? Breast cancer Sister 59  ? Colon polyps Sister   ? Lung cancer Sister   ?     former smoker  ? Stroke Maternal Grandfather   ? Diabetes Paternal Grandfather   ? Colon cancer Neg Hx   ? Esophageal cancer Neg Hx   ? Stomach cancer Neg Hx   ? Rectal cancer Neg Hx   ? ? ?Social History  ? ?Socioeconomic History  ? Marital status: Married  ?  Spouse name: [name]  ? Number of children: 3  ? Years of education: Not on file  ? Highest education level: Not on file  ?Occupational History  ? Occupation: retired  ?Tobacco Use  ? Smoking status: Never  ? Smokeless tobacco: Never  ?Vaping Use  ? Vaping Use: Never used  ?Substance and Sexual Activity  ? Alcohol use: Never  ? Drug use: Never  ? Sexual activity: Not Currently  ?Other Topics Concern  ? Not on file  ?Social History Narrative  ? Moved to area  from [place] 08/2018  ? Lives one story home  ? Right handed.  ? ?Social Determinants of Health  ? ?Financial Resource Strain: Low Risk   ? Difficulty of Paying Living Expenses: Not hard at all  ?Food Insecurity: No Food Insecurity  ? Worried About Charity fundraiser in the Last Year: Never true  ? Ran Out of Food in the Last Year: Never true  ?Transportation Needs: No Transportation Needs  ? Lack of Transportation (Medical): No  ? Lack of Transportation (Non-Medical): No  ?Physical Activity: Insufficiently Active  ? Days of Exercise per Week: 2 days  ? Minutes of Exercise per Session: 50 min  ?Stress: No Stress Concern Present  ? Feeling of Stress : Not at all  ?Social Connections: Moderately Integrated  ? Frequency of Communication with Friends and Family: Three times a week  ? Frequency of Social Gatherings with Friends and Family: Three times a week  ? Attends Religious Services: More than 4 times per year  ? Active Member of Clubs or Organizations: No  ? Attends Archivist Meetings: Never  ? Marital Status: Married  ?Intimate Partner Violence: Not At Risk  ? Fear of Current or Ex-Partner: No   ? Emotionally Abused: No  ? Physically Abused: No  ? Sexually Abused: No  ? ? ?Past Medical History, Surgical history, Social history, and Family history were reviewed and updated as appropriate.  ? ?Please see revi

## 2021-07-26 ENCOUNTER — Ambulatory Visit (INDEPENDENT_AMBULATORY_CARE_PROVIDER_SITE_OTHER): Payer: Medicare Other | Admitting: Psychiatry

## 2021-07-26 DIAGNOSIS — F5105 Insomnia due to other mental disorder: Secondary | ICD-10-CM | POA: Diagnosis not present

## 2021-07-26 DIAGNOSIS — F4310 Post-traumatic stress disorder, unspecified: Secondary | ICD-10-CM

## 2021-07-26 DIAGNOSIS — F3132 Bipolar disorder, current episode depressed, moderate: Secondary | ICD-10-CM | POA: Diagnosis not present

## 2021-07-26 DIAGNOSIS — F99 Mental disorder, not otherwise specified: Secondary | ICD-10-CM

## 2021-07-26 DIAGNOSIS — Z853 Personal history of malignant neoplasm of breast: Secondary | ICD-10-CM

## 2021-07-26 DIAGNOSIS — R69 Illness, unspecified: Secondary | ICD-10-CM

## 2021-07-26 DIAGNOSIS — Z8669 Personal history of other diseases of the nervous system and sense organs: Secondary | ICD-10-CM

## 2021-07-26 DIAGNOSIS — N184 Chronic kidney disease, stage 4 (severe): Secondary | ICD-10-CM

## 2021-07-26 DIAGNOSIS — E039 Hypothyroidism, unspecified: Secondary | ICD-10-CM

## 2021-07-26 DIAGNOSIS — F4010 Social phobia, unspecified: Secondary | ICD-10-CM | POA: Diagnosis not present

## 2021-07-26 DIAGNOSIS — Z86010 Personal history of colonic polyps: Secondary | ICD-10-CM
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
## 2021-07-27 ENCOUNTER — Telehealth: Payer: Self-pay | Admitting: Adult Health

## 2021-07-27 NOTE — Telephone Encounter (Signed)
Called and LVM for patient to return call. 

## 2021-07-27 NOTE — Telephone Encounter (Signed)
Can we give her a call to verify how she's doing - I see her on the 25th.

## 2021-07-27 NOTE — Telephone Encounter (Signed)
Called patient and she stated she is feeling jerked around by the bipolar. She said yesterday in Andy's office she was "climbing the walls" and "couldn't wait" until her appt to get help. She denies SI. She said she is cycling so quickly thru her mania that she can't go anywhere or have anyone over because she doesn't know how she will be. She was crying earlier and said in 15 minutes she may have music on and dancing around. She said she is better now than she was a month ago though.  She was tearful off and on thru our conversation. She said Jonni Sanger mentioned increasing her lamotrigine.

## 2021-07-27 NOTE — Telephone Encounter (Signed)
Please see message from [place].

## 2021-07-27 NOTE — Telephone Encounter (Signed)
Next visit is 08/02/21. Per Jonni Sanger, who sees Tiffany [name], he states she is barely tolerating ultra mood cycling. Is it possible to make a recommendation for mood stabilizer now?

## 2021-07-30 ENCOUNTER — Encounter: Payer: Self-pay | Admitting: Physician Assistant

## 2021-07-30 ENCOUNTER — Ambulatory Visit (INDEPENDENT_AMBULATORY_CARE_PROVIDER_SITE_OTHER): Payer: Medicare Other | Admitting: Physician Assistant

## 2021-07-30 VITALS — BP 107/70 | HR 55 | Temp 98.2°F | Ht 64.0 in | Wt 200.0 lb

## 2021-07-30 DIAGNOSIS — R634 Abnormal weight loss: Secondary | ICD-10-CM

## 2021-07-30 DIAGNOSIS — F3160 Bipolar disorder, current episode mixed, unspecified: Secondary | ICD-10-CM | POA: Diagnosis not present

## 2021-07-30 DIAGNOSIS — M25561 Pain in right knee: Secondary | ICD-10-CM | POA: Diagnosis not present

## 2021-07-30 NOTE — Patient Instructions (Addendum)
It was great to see you!  I will place referral to [facility] with Physical Therapy for your knee  Please follow-up with [place] in 3-6 months, sooner if concerns  Take care,  Inda Coke PA-C

## 2021-07-30 NOTE — Progress Notes (Signed)
Tiffany [name] is a 71 y.o. female here for a follow up of a pre-existing problem.  History of Present Illness:   Chief Complaint  Patient presents with   Weight Loss   Depression   Knee Pain    Pt c/o right knee pain and would like a referral for PT.    HPI  Unintentional Weight Loss Patient here for follow up. Last seen about a month ago. She is here with her husband today. At that time, she has had noticed increased weight loss. We had concern this might be related to increased energy expenditure and with ongoing mania. She had workup in the office including UA, and chest x-ray which was normal. She notes she was started on Depakote 250 mg for her mania but has stopped taking this due to side effects. She has been doing well since last visit She is down about 4 pounds since last visit. She notes she has been not exercising due to her knee pain. She notes her symptoms has resolved and is not concern about this at this time. She is seeing her endocrinologist on 08/01/2021 due to concern that this might be hormonal.   Knee pain  Patient complain of right knee pain that has been onset for a while. She has seen physical therapist in the past with some improvement. She notes she has been unable to exercise due to pain. She has tried elevating her leg, icing and Tylenol with some improvement. No other specific treatment tried. She would like to be referred to PT for further evaluation. Denies swelling or redness. Does have a hx of osteoarthritis.   Bipolar Disorder  Patient has had increased mood changes for the past few months. She has had some SI thoughts since last visit however, this has improved. She has been feeling much better since last visit. She has been managing her symptoms well at this time. Sees [employer] regularly.   Past Medical History:  Diagnosis Date   Bipolar 1 disorder ([place])    Breast cancer ([place])    Chronic diarrhea    loose stools twice a day on average for years    Chronic kidney disease (CKD), stage III (moderate) ([place])    Depression 1987   Family history of breast cancer    Hospitalization or health care facility admission within last 6 months 04/2019   for fall/seizures   Malignant neoplasm of overlapping sites of left breast in female, estrogen receptor positive ([name]) 03/14/2016   Dx in 09/2014, s/p bilateral mastectomies and ALND, 0/10 LN. 1.4 cm  Grade I invasive lobular, ER and PR +/Her--, Ki 67 <5% Tried anastrozole for one month, but developed suicidal idea   Osteoarthritis, knee 09/24/2019   Xray 09/2019   Parkinson's disease ([place]) 2012   Personal history of malignant neoplasm of breast    Polyposis of colon    Psoriatic arthritis ([place])    PTSD (post-traumatic stress disorder)    Secondary hyperparathyroidism ([place])    Tardive dyskinesia    Thyroid disease      Social History   Tobacco Use   Smoking status: Never   Smokeless tobacco: Never  Vaping Use   Vaping Use: Never used  Substance Use Topics   Alcohol use: Never   Drug use: Never    Past Surgical History:  Procedure Laterality Date   ABDOMINAL HYSTERECTOMY  1987   CHOLECYSTECTOMY  1979   DILATION AND CURETTAGE OF UTERUS  1973   MASTECTOMY Bilateral 09/19/2014  TONSILLECTOMY  1970   URETERAL REIMPLANTION Bilateral 1974    Family History  Problem Relation Age of Onset   Lymphoma Mother 34   Lymphoma Sister 23   Breast cancer Sister 72   Colon polyps Sister    Lung cancer Sister        former smoker   Stroke Maternal Grandfather    Diabetes Paternal Grandfather    Colon cancer Neg Hx    Esophageal cancer Neg Hx    Stomach cancer Neg Hx    Rectal cancer Neg Hx     Allergies  Allergen Reactions   Aripiprazole Other (See Comments)    Parkinsonism      Lactose Intolerance (Gi) Diarrhea   Methotrexate Other (See Comments)    Hair loss, severe stomatitis     Cefdinir Diarrhea    Other reaction(s): Diarrhea Yeast infection and fever; negative c diff    Etanercept Other (See Comments)    Headaches     Exemestane Other (See Comments)    Suicidal thoughts with medication     Fluoxetine Other (See Comments)    Parkinsonism   Lactose     Other reaction(s): diarrhea Other reaction(s): diarrhea Other reaction(s): diarrhea   Methylprednisolone Sodium Succ Other (See Comments)    Agitated mania   Epinephrine Palpitations    tachycardia    Nitrofurantoin Nausea And Vomiting and Rash      Other reaction(s): rash, diarrhea Other reaction(s): rash, diarrhea Other reaction(s): rash, diarrhea Other reaction(s): rash, diarrhea Other reaction(s): rash, diarrhea Other reaction(s): rash, diarrhea    Current Medications:   Current Outpatient Medications:    Azelaic Acid 15 % gel, , Disp: , Rfl:    Certolizumab Pegol (CIMZIA) 2 X 200 MG KIT, See admin instructions., Disp: , Rfl:    cholecalciferol (VITAMIN D3) 25 MCG (1000 UNIT) tablet, Take 1,000 Units by mouth daily., Disp: , Rfl:    lamoTRIgine (LAMICTAL) 200 MG tablet, Take 1 tablet (200 mg total) by mouth at bedtime., Disp: 90 tablet, Rfl: 3   levothyroxine (SYNTHROID) 112 MCG tablet, TAKE 1 TABLET BY MOUTH EVERY DAY BEFORE BREAKFAST, Disp: 90 tablet, Rfl: 0   lithium carbonate 300 MG capsule, TAKE 1 CAPSULE(300 MG) BY MOUTH AT BEDTIME, Disp: 90 capsule, Rfl: 3   LORazepam (ATIVAN) 1 MG tablet, TAKE 1 TABLET(1 MG) BY MOUTH TWICE DAILY AS NEEDED, Disp: 60 tablet, Rfl: 2   metroNIDAZOLE (METROGEL) 0.75 % gel, Apply to face 1-2 times daily, Disp: 45 g, Rfl: 0   sodium bicarbonate 650 MG tablet, Take 650 mg by mouth daily., Disp: , Rfl:    traZODone (DESYREL) 50 MG tablet, Take one to two tablets at bedtime., Disp: 60 tablet, Rfl: 2   Review of Systems:   ROS Negative unless otherwise specified per HPI.  Vitals:   Vitals:   07/30/21 1049  BP: 107/70  Pulse: (!) 55  Temp: 98.2 F (36.8 C)  TempSrc: Temporal  SpO2: 97%  Weight: 200 lb (90.7 kg)  Height: _0  (1.626 m)      Body mass index is 34.33 kg/m.  Physical Exam:   Physical Exam Vitals and nursing note reviewed.  Constitutional:      General: She is not in acute distress.    Appearance: She is well-developed. She is not ill-appearing or toxic-appearing.  Cardiovascular:     Rate and Rhythm: Normal rate and regular rhythm.     Pulses: Normal pulses.     Heart sounds: Normal heart sounds,  S1 normal and S2 normal.  Pulmonary:     Effort: Pulmonary effort is normal.     Breath sounds: Normal breath sounds.  Skin:    General: Skin is warm and dry.  Neurological:     Mental Status: She is alert.     GCS: GCS eye subscore is 4. GCS verbal subscore is 5. GCS motor subscore is 6.  Psychiatric:        Speech: Speech normal.        Behavior: Behavior normal. Behavior is cooperative.    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   I, Inda Coke, [place], have reviewed all documentation for this visit. The documentation on 07/30/21 for the exam, diagnosis, procedures, and orders are all accurate and complete.  Inda Coke, PA-C

## 2021-08-02 ENCOUNTER — Encounter: Payer: Self-pay | Admitting: Adult Health

## 2021-08-02 ENCOUNTER — Ambulatory Visit (INDEPENDENT_AMBULATORY_CARE_PROVIDER_SITE_OTHER): Payer: Medicare Other | Admitting: Adult Health

## 2021-08-02 DIAGNOSIS — F5105 Insomnia due to other mental disorder: Secondary | ICD-10-CM | POA: Diagnosis not present

## 2021-08-02 DIAGNOSIS — F4310 Post-traumatic stress disorder, unspecified: Secondary | ICD-10-CM

## 2021-08-02 DIAGNOSIS — F99 Mental disorder, not otherwise specified: Secondary | ICD-10-CM

## 2021-08-02 DIAGNOSIS — F3132 Bipolar disorder, current episode depressed, moderate: Secondary | ICD-10-CM

## 2021-08-02 DIAGNOSIS — F411 Generalized anxiety disorder: Secondary | ICD-10-CM

## 2021-08-02 NOTE — Progress Notes (Signed)
Tiffany [name] [id number] 11-20-1950 71 y.o.  Subjective:   Patient ID:  Tiffany [name] is a 71 y.o. (DOB 25-Dec-1950) female.  Chief Complaint: No chief complaint on file.   HPI Tiffany [name] presents to the office today for follow-up of PTSD, insomnia, GAD, BPD 1.  Accompanied by husband.  Describes mood today as "ok". Pleasant. Tearful at times. Mood symptoms - reports depression, anxiety and irritability. Reports mood instability - decreased mania and cycling. Has returned to previous medication regimen. Stating "I'm feeling better". Working with endocrinologist to regulate TSH. Seeing Dr. Rica Mote weekly. Varying interest and motivation. Taking medications as prescribed. Energy levels vary. Active, does not have a regular exercise routine. Walking some days. Enjoys some usual interests and activities. Married. Lives with husband their daughter. Talking to family and friends.  Appetite adequate. Weight gain.   Sleeping better some nights than others. Averages 4 to 6 hours with Trazadone. Focus and concentration improved. Completing tasks. Managing some aspects of household. Retired.  Denies SI. Denies HI.  Denies AH Denies VH     Previous medications: Celexa, Zyprexa, Tegretol, Depakote, Serzone, Topamax, Seroquel, Effexor, Lexapro, Desipramine, Neurontin, Abilify, Geodon, Propanolol, Cymbalta, Cogentin, Trihexyphenadyl, Sinmmet, Provigil, Selegiline, Requip, Amantadine, Prozac, Mirapex, Azilect, Metoclopramide, Baclofen, Artane, Namenda, Latuda.    [place] Office Visit from 02/16/2021 in [facility] Visit from 05/14/2019 in [facility]  Total GAD-7 Score 13 [place] Office Visit from 06/07/2019 in [facility]  Total Score (max 30 points ) 29      PHQ2-9    [place] Visit from 07/30/2021 in [facility] Visit from 07/02/2021  in [facility] from 04/02/2021 in [facility] Visit from 02/16/2021 in [facility] from 07/26/2020 in [facility]  PHQ-2 Total Score 2 1 1 3 1   PHQ-9 Total Score 11 6 1 6  --      Flowsheet Row Clinical Support from 04/02/2021 in [facility] from 03/21/2021 in [facility] ED from 02/03/2021 in [facility] No Risk No Risk No Risk        Review of Systems:  Review of Systems  Musculoskeletal:  Negative for gait problem.  Neurological:  Negative for tremors.  Psychiatric/Behavioral:         Please refer to HPI   Medications: I have reviewed the patient's current medications.  Current Outpatient Medications  Medication Sig Dispense Refill   Azelaic Acid 15 % gel      Certolizumab Pegol (CIMZIA) 2 X 200 MG KIT See admin instructions.     cholecalciferol (VITAMIN D3) 25 MCG (1000 UNIT) tablet Take 1,000 Units by mouth daily.     lamoTRIgine (LAMICTAL) 200 MG tablet Take 1 tablet (200 mg total) by mouth at bedtime. 90 tablet 3   levothyroxine (SYNTHROID) 112 MCG tablet TAKE 1 TABLET BY MOUTH EVERY DAY BEFORE BREAKFAST 90 tablet 0   lithium carbonate 300 MG capsule TAKE 1 CAPSULE(300 MG) BY MOUTH AT BEDTIME 90 capsule 3   LORazepam (ATIVAN) 1 MG tablet TAKE 1 TABLET(1 MG) BY MOUTH TWICE DAILY AS NEEDED 60 tablet 2   metroNIDAZOLE (METROGEL) 0.75 % gel Apply to face 1-2 times daily 45 g 0   sodium bicarbonate 650 MG tablet Take 650  mg by mouth daily.     traZODone (DESYREL) 50 MG tablet Take one to two tablets at bedtime. 60 tablet 2   No current facility-administered medications for this visit.    Medication Side Effects: None  Allergies:  Allergies  Allergen Reactions   Aripiprazole Other (See Comments)    Parkinsonism      Lactose Intolerance (Gi) Diarrhea    Methotrexate Other (See Comments)    Hair loss, severe stomatitis     Cefdinir Diarrhea    Other reaction(s): Diarrhea Yeast infection and fever; negative c diff   Etanercept Other (See Comments)    Headaches     Exemestane Other (See Comments)    Suicidal thoughts with medication     Fluoxetine Other (See Comments)    Parkinsonism   Lactose     Other reaction(s): diarrhea Other reaction(s): diarrhea Other reaction(s): diarrhea   Methylprednisolone Sodium Succ Other (See Comments)    Agitated mania   Epinephrine Palpitations    tachycardia    Nitrofurantoin Nausea And Vomiting and Rash      Other reaction(s): rash, diarrhea Other reaction(s): rash, diarrhea Other reaction(s): rash, diarrhea Other reaction(s): rash, diarrhea Other reaction(s): rash, diarrhea Other reaction(s): rash, diarrhea    Past Medical History:  Diagnosis Date   Bipolar 1 disorder (H[facility])    Breast cancer (H[facility])    Chronic diarrhea    loose stools twice a day on average for years   Chronic kidney disease (CKD), stage III (moderate) ([place])    Depression 1987   Family history of breast cancer    Hospitalization or health care facility admission within last 6 months 04/2019   for fall/seizures   Malignant neoplasm of overlapping sites of left breast in female, estrogen receptor positive ([place]) 03/14/2016   Dx in 09/2014, s/p bilateral mastectomies and ALND, 0/10 LN. 1.4 cm  Grade I invasive lobular, ER and PR +/Her--, Ki 67 <5% Tried anastrozole for one month, but developed suicidal idea   Osteoarthritis, knee 09/24/2019   Xray 09/2019   Parkinson's disease ([place]) 2012   Personal history of malignant neoplasm of breast    Polyposis of colon    Psoriatic arthritis ([place])    PTSD (post-traumatic stress disorder)    Secondary hyperparathyroidism ([place])    Tardive dyskinesia    Thyroid disease     Past Medical History, Surgical history, Social history, and Family history were reviewed and updated as  appropriate.   Please see review of systems for further details on the patient's review from today.   Objective:   Physical Exam:  There were no vitals taken for this visit.  Physical Exam Constitutional:      General: She is not in acute distress. Musculoskeletal:        General: No deformity.  Neurological:     Mental Status: She is alert and oriented to person, place, and time.     Coordination: Coordination normal.  Psychiatric:        Attention and Perception: Attention and perception normal. She does not perceive auditory or visual hallucinations.        Mood and Affect: Mood normal. Mood is not anxious or depressed. Affect is not labile, blunt, angry or inappropriate.        Speech: Speech normal.        Behavior: Behavior normal.        Thought Content: Thought content normal. Thought content is not paranoid or delusional. Thought content does not include homicidal or  suicidal ideation. Thought content does not include homicidal or suicidal plan.        Cognition and Memory: Cognition and memory normal.        Judgment: Judgment normal.     Comments: Insight intact    Lab Review:     Component Value Date/Time   NA 142 07/02/2021 1048   NA 138 06/08/2019 0000   K 4.6 07/02/2021 1048   CL 109 07/02/2021 1048   CO2 25 07/02/2021 1048   GLUCOSE 87 07/02/2021 1048   BUN 28 (H) 07/02/2021 1048   BUN 29 (A) 06/08/2019 0000   CREATININE 2.36 (H) 07/02/2021 1048   CREATININE 2.29 (H) 05/22/2021 1044   CALCIUM 9.7 07/02/2021 1048   PROT 7.6 07/02/2021 1048   ALBUMIN 4.2 07/02/2021 1048   AST 13 07/02/2021 1048   ALT 14 07/02/2021 1048   ALKPHOS 125 (H) 07/02/2021 1048   BILITOT 1.2 07/02/2021 1048   GFRNONAA 22 (L) 03/21/2021 1123   GFRAA 25 (L) 09/13/2019 1243       Component Value Date/Time   WBC 12.2 (H) 07/02/2021 1048   RBC 4.54 07/02/2021 1048   HGB 13.6 07/02/2021 1048   HCT 42.0 07/02/2021 1048   PLT 283.0 07/02/2021 1048   MCV 92.5 07/02/2021 1048    MCH 29.1 03/21/2021 1123   MCHC 32.4 07/02/2021 1048   RDW 13.8 07/02/2021 1048   LYMPHSABS 3.2 07/02/2021 1048   MONOABS 0.7 07/02/2021 1048   EOSABS 0.2 07/02/2021 1048   BASOSABS 0.1 07/02/2021 1048    Lithium Lvl  Date Value Ref Range Status  05/22/2021 0.7 0.6 - 1.2 mmol/L Final     No results found for: PHENYTOIN, PHENOBARB, VALPROATE, CBMZ   .res Assessment: Plan:    Plan:  Therapist - Andy Mitchum   Lorazepam 54m BID for anxiety - make take one tablet extra for severe anxiety symptoms. Taking one at night routinely. Lamictal 2088mhs Lithium 3009maily  Ativan 1mg40mD Trazadone 50mg67m to 2 at bedtime  Last Lithium level was 0.7 on 05/22/2021 TSH - .98 06/27/2021 BUN 29 - CR 2.29 3/14   RTC 4 weeks   Counseled patient regarding potential benefits, risks, and side effects of Lamictal to include potential risk of Stevens-Johnson syndrome. Advised patient to stop taking Lamictal and contact office immediately if rash develops and to seek urgent medical attention if rash is severe and/or spreading quickly.  Discussed potential benefits, risk, and side effects of benzodiazepines to include potential risk of tolerance and dependence, as well as possible drowsiness.  Advised patient not to drive if experiencing drowsiness and to take lowest possible effective dose to minimize risk of dependence and tolerance.   Diagnoses and all orders for this visit:  Bipolar I disorder, most recent episode depressed, moderate (HCC)  PTSD (post-traumatic stress disorder)  Insomnia due to other mental disorder  Generalized anxiety disorder     Please see After Visit Summary for patient specific instructions.  Future Appointments  Date Time Provider DeparSkyline-Ganipa6/2023  2:00 PM MitchBlanchie Serve CP-CP None  08/10/2021  3:00 PM MitchBlanchie Serve CP-CP None  08/15/2021  9:00 AM MitchBlanchie Serve CP-CP None  08/22/2021  2:00 PM MitchBlanchie Serve CP-CP None   08/30/2021 12:30 PM CarroCarney LivingDWB-REH DWB  08/31/2021  4:00 PM MitchBlanchie Serve CP-CP None  10/02/2021 11:40 AM Juleen Sorrels, ReginBerdie OgrenCP-CP None  12/10/2021 10:00 AM WorleInda CokeLBPC-HPC  PEC  12/31/2021 11:30 AM Shamleffer, Melanie Crazier, MD LBPC-LBENDO None  04/15/2022  1:45 PM LBPC-HPC HEALTH COACH LBPC-HPC PEC    No orders of the defined types were placed in this encounter.   -------------------------------

## 2021-08-03 ENCOUNTER — Ambulatory Visit (INDEPENDENT_AMBULATORY_CARE_PROVIDER_SITE_OTHER): Payer: Medicare Other | Admitting: Psychiatry

## 2021-08-03 DIAGNOSIS — F3132 Bipolar disorder, current episode depressed, moderate: Secondary | ICD-10-CM

## 2021-08-03 DIAGNOSIS — F4310 Post-traumatic stress disorder, unspecified: Secondary | ICD-10-CM | POA: Diagnosis not present

## 2021-08-03 DIAGNOSIS — N184 Chronic kidney disease, stage 4 (severe): Secondary | ICD-10-CM

## 2021-08-03 DIAGNOSIS — E039 Hypothyroidism, unspecified: Secondary | ICD-10-CM | POA: Diagnosis not present

## 2021-08-03 DIAGNOSIS — Z8669 Personal history of other diseases of the nervous system and sense organs: Secondary | ICD-10-CM | POA: Diagnosis not present

## 2021-08-03 DIAGNOSIS — Z634 Disappearance and death of family member: Secondary | ICD-10-CM
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
## 2021-08-10 ENCOUNTER — Ambulatory Visit (INDEPENDENT_AMBULATORY_CARE_PROVIDER_SITE_OTHER): Payer: Medicare Other | Admitting: Psychiatry

## 2021-08-10 DIAGNOSIS — F4310 Post-traumatic stress disorder, unspecified: Secondary | ICD-10-CM | POA: Diagnosis not present

## 2021-08-10 DIAGNOSIS — E039 Hypothyroidism, unspecified: Secondary | ICD-10-CM

## 2021-08-10 DIAGNOSIS — F3160 Bipolar disorder, current episode mixed, unspecified: Secondary | ICD-10-CM | POA: Diagnosis not present

## 2021-08-10 DIAGNOSIS — Z8669 Personal history of other diseases of the nervous system and sense organs: Secondary | ICD-10-CM | POA: Diagnosis not present

## 2021-08-10 DIAGNOSIS — N184 Chronic kidney disease, stage 4 (severe): Secondary | ICD-10-CM

## 2021-08-10 NOTE — Progress Notes (Signed)
Psychotherapy Progress Note [facility] [name], PhD LP  Patient ID: Tiffany [name])    MRN: [id number] Therapy format: Individual psychotherapy Date: 08/10/2021      Start: 3:19p     Stop: 4:08p     Time Spent: 49 min Location: In-person   Session narrative (presenting needs, interim history, self-report of stressors and symptoms, applications of prior therapy, status changes, and interventions made in session) Enjoying seeing granddaughter Rudene Christians.  Came across list of coping thoughts for dark moods and suicidal thinking this week, helpful, and has even added to the idea list of things she can do to change mood (watch birds, go to [facility]).  Made it to [place] twice this week, in fact, enjoyed it, did not feel intimidated either physically or socially, was blessed to get a pointer from her former PT.  Also counted as quality time with [facility].  Mood continues to cycle, seemed to cycle faster than usual, more tears than usual.  Not sleeping that much, resorted to an Ativan one night after being up to 1 or 2, and had what sounds like a parasympathetic rebound experience, oversleeping the next day.  Looked up a casual mention from earlier (the enneagram).  Has contacted the [facility] about peer support service and/or women's group.  Taken aback by the question why she was referred, temporarily wondered if [place] abandoning her.  Clarified Corporate investment banker, expanded support system, helping  be a more welcome place).  Also connected with a [employer] through [facility] -- just because she called, normally for members but decided it was a good thing to do offering to someone beyond the membership.  Feeling more capable of obtaining support, changing her mood for assertively doing these things.  Still doing a lot of crying, not all depression, sometimes just moved.  Cycles running 2-3 hours both ways.  No frank mania, at most episodes of feeling like  dancing and grooving to music.  Practicing gratitude more with [name].  Affirmed and encouraged.    Given medication timing, could be that Lamictal increases earlier in May -- and levothyroid reduction last week -- are helping reduce severity of swings.  Also noted that endocrine advice to segregate vitamins and thyroid medicine may be helping as well.  Thyroid panel in 6 weeks, plus validating feedback that her TSH reading was a bit too good.  Knows she is supposed to get lithium level, not clear if ordered yet or supposed to wait.  Re. lithium/kidney concern, will see nephro next week.  Suggested worth asking for a statement validating what leeway there is for lithium increases, from a kidney care point of view, so judgment calls can be made with less uncertainty and anxiety balancing medications.  Therapeutic modalities: Cognitive Behavioral Therapy, Solution-Oriented/Positive Psychology, and Ego-Supportive  Mental Status/Observations:  Appearance:   Casual and Neat     Behavior:  Appropriate  Motor:  Normal  Speech/Language:   Clear and Coherent  Affect:  Appropriate  Mood:  normal  Thought process:  normal  Thought content:    WNL  Sensory/Perceptual disturbances:    WNL  Orientation:  Fully oriented  Attention:  Good    Concentration:  Fair  Memory:  grossly intact  Insight:    Good  Judgment:   Good  Impulse Control:  Fair   Risk Assessment: Danger to Self: No Self-injurious Behavior: No Danger to Others: No Physical Aggression / Violence: No Duty to Warn: No Access to Firearms  a concern: No  Assessment of progress:  progressing  Diagnosis:   ICD-10-CM   1. Bipolar affective disorder, mixed ([place])  F31.60     2. PTSD (post-traumatic stress disorder)  F43.10     3. Acquired hypothyroidism  E03.9     4. History of small, chronic left frontal infarct, as assessed by neurologist  Z86.69     5. History of metabolic encephalopathy  K53.97     6. CKD (chronic kidney  disease) stage 4, GFR 15-29 ml/min (HCC) by history  N18.4      Plan:  Self-monitor for changes in mood swings, if correlate with thyroid and mood stabilizer changes Continue beneficial PT, physical activity Explore further social support through [place] optional. Continue beneficial social time with family members Continue anti-suicide plan Follow through with nephrology and obtain clearer word what leeway she has for use of mood stabilizers. Other recommendations/advice as may be noted above Continue to utilize previously learned skills ad lib Maintain medication as prescribed and work faithfully with relevant prescriber(s) if any changes are desired or seem indicated Call the clinic on-call service, 988/hotline, 911, or present to [facility] or ER if any life-threatening psychiatric crisis Return for session(s) already scheduled. Already scheduled visit in this office 08/15/2021.  [name], PhD [name], PhD LP Clinical Psychologist, [facility] [address] [place], [place] [address] [phone]

## 2021-08-15 ENCOUNTER — Ambulatory Visit (INDEPENDENT_AMBULATORY_CARE_PROVIDER_SITE_OTHER): Payer: Medicare Other | Admitting: Psychiatry

## 2021-08-15 DIAGNOSIS — F4310 Post-traumatic stress disorder, unspecified: Secondary | ICD-10-CM

## 2021-08-15 DIAGNOSIS — F3132 Bipolar disorder, current episode depressed, moderate: Secondary | ICD-10-CM

## 2021-08-15 DIAGNOSIS — F99 Mental disorder, not otherwise specified: Secondary | ICD-10-CM

## 2021-08-15 DIAGNOSIS — N184 Chronic kidney disease, stage 4 (severe): Secondary | ICD-10-CM

## 2021-08-15 DIAGNOSIS — F5105 Insomnia due to other mental disorder: Secondary | ICD-10-CM | POA: Diagnosis not present

## 2021-08-15 DIAGNOSIS — Z8669 Personal history of other diseases of the nervous system and sense organs: Secondary | ICD-10-CM

## 2021-08-15 DIAGNOSIS — F411 Generalized anxiety disorder: Secondary | ICD-10-CM

## 2021-08-15 DIAGNOSIS — F4010 Social phobia, unspecified: Secondary | ICD-10-CM

## 2021-08-15 DIAGNOSIS — E039 Hypothyroidism, unspecified: Secondary | ICD-10-CM

## 2021-08-15 NOTE — Progress Notes (Signed)
Psychotherapy Progress Note [facility] Tiffany Moore, PhD LP  Patient ID: Tiffany [name])    MRN: [id number] Therapy format: Individual psychotherapy Date: 08/15/2021      Start: 9:20a     Stop: 10:20a     Time Spent: 60 min Location: In-person   Session narrative (presenting needs, interim history, self-report of stressors and symptoms, applications of prior therapy, status changes, and interventions made in session) In more of a low energy mood lately, with some mental fog.  In an arthritic flare up right now, too.  Right index finger went through painful swelling.  Consulted nephrologist yesterday, cleared her for modest amount of NSAID use.  Also considering a pharmacotherapy for kidney function that clears lithium faster but wants to consult with psychiatry.  Glad to have the attentive, apt care, but it also broke some denial about having serious kidney disease.  Not suicidal.  Disappointed that the [employer] has not called her back.  Tiffany [name] on her that maybe it was because she doesn't fit their demographic (assumption based on questions asked).  Not personalizing it.  On the positive side, met with two "lovely ladies" yesterday from the [employer] of a church she identified, says they really impressed her with their authenticity and empathy.  Dismayed to find out they weren't going to be her assigned minister, as she felt herself attaching immediately and deeply to them.  As coordinators, they did pledge to match her with a [employer], though.  Encouraged acceptance.  Notably, also invited her to their church, and respectfully queried her about her own experience and preferences.  Considering going for a program, and cheered to find Tiffany [name] was not overcautious about it.    Reveals a traumatic experience Saturday, leery of saying it now for fear it would be "minimized", but assured Tx would not, knows better by now, and confirmed that [facility], who  knows already, did not.  Essentially, she experienced sensory overload entering [employer], particular trigger being a persistent high-pitched cry from a young boy calling for his daddy.  She recalls advice from the neuro unit never to go in a place like that without sensory attenuation, like sunglasses, earplugs, hat, and the encounter was very triggering for her to recall her own childhood experience being ignored and distraught.  Notable that she kept silence, in keeping with social norms of the day and her parenting, while this child was calling out freely -- as she would have wanted to be able to.  The incident has set off a series of flashbacks the last several days, including moments of fury toward her father for emotional neglect, memories of 42yo mother walking out with suitcases telling [name] and a sister she can't deal with them any more.  Historical issue of father being absent (prominent physician, health commissioner in [place], lots of events with the mayor, eventually learned having an affair with a city nursing official).  Alludes to another trauma of late but will save it for next time.  Support/empathy provided, confirmed it is not minimizable or meaningless, and thanked for revealing more of the specifics of her childhood experience, which better informs understanding of reactions she's shown in treatment and her rich history of feeling misused by health care providers.  Discussed possible role of systemic inflammation in current depressive flareup, esp given arthritic flareup symptoms and brain fog.  Encouraged antiinflammatory diet, OTC med as endorsed by physician, and temporary  increase in omega 3 supplementation if  it makes medical sense.  Therapeutic modalities: Cognitive Behavioral Therapy, Solution-Oriented/Positive Psychology, and Ego-Supportive  Mental Status/Observations:  Appearance:   Casual     Behavior:  Appropriate  Motor:  A bit stiff, forward lean walking   Speech/Language:   Clear and Coherent and occasional dysfluency  Affect:  Appropriate  Mood:  depressed  Thought process:  normal  Thought content:    WNL and intrusive memories  Sensory/Perceptual disturbances:    WNL  Orientation:  Fully oriented  Attention:  Good    Concentration:  Good  Memory:  WNL  Insight:    Good  Judgment:   Good  Impulse Control:  Good   Risk Assessment: Danger to Self: No Self-injurious Behavior: No Danger to Others: No Physical Aggression / Violence: No Duty to Warn: No Access to Firearms a concern: No  Assessment of progress:  progressing  Diagnosis:   ICD-10-CM   1. Bipolar I disorder, most recent episode depressed, moderate ([place])  F31.32     2. PTSD (post-traumatic stress disorder)  F43.10     3. Insomnia due to other mental disorder  F51.05    F99     4. Generalized anxiety disorder  F41.1     5. Social anxiety disorder  F40.10     6. Acquired hypothyroidism  E03.9     7. CKD (chronic kidney disease) stage 4, GFR 15-29 ml/min (HCC) by history  N18.4     8. History of small, chronic left frontal infarct, as assessed by neurologist  Z86.69     9. History of metabolic encephalopathy  L54.49      Plan:  Continue making supportive connections, priority on [employer] Continue approaching workout program for overall wellbeing and socialization Encourage antiinflammatory eating and temporary increase in omega 3 supplement subject to medical advice Continue walking out adjustment in thyroid medication per endocrinologist, with hopes it will be stabilizing Address balance of kidney function and lithium dosing with psychiatry, including nephrologist's interest in conferring with Ms. Dwaine Gale Maintain family supervision of lethal means Will continue to receive and process traumatic memory as moved Other recommendations/advice as may be noted above Continue to utilize previously learned skills ad lib Maintain medication as  prescribed and work faithfully with relevant prescriber(s) if any changes are desired or seem indicated Call the clinic on-call service, 988/hotline, 911, or present to [facility] or ER if any life-threatening psychiatric crisis Return in about 1 week (around 08/22/2021) for session(s) already scheduled. Already scheduled visit in this office 08/22/2021.  [name], PhD Tiffany Moore, PhD LP Clinical Psychologist, [facility] [address] [place], [place] [address] [phone]

## 2021-08-16 ENCOUNTER — Encounter: Payer: Self-pay | Admitting: Adult Health

## 2021-08-16 ENCOUNTER — Telehealth: Payer: Medicare Other

## 2021-08-16 ENCOUNTER — Ambulatory Visit (INDEPENDENT_AMBULATORY_CARE_PROVIDER_SITE_OTHER): Payer: Medicare Other | Admitting: Adult Health

## 2021-08-16 DIAGNOSIS — F99 Mental disorder, not otherwise specified: Secondary | ICD-10-CM

## 2021-08-16 DIAGNOSIS — F5105 Insomnia due to other mental disorder: Secondary | ICD-10-CM | POA: Diagnosis not present

## 2021-08-16 DIAGNOSIS — Z79899 Other long term (current) drug therapy: Secondary | ICD-10-CM | POA: Diagnosis not present

## 2021-08-16 DIAGNOSIS — F4310 Post-traumatic stress disorder, unspecified: Secondary | ICD-10-CM

## 2021-08-16 DIAGNOSIS — F3132 Bipolar disorder, current episode depressed, moderate: Secondary | ICD-10-CM | POA: Diagnosis not present

## 2021-08-16 DIAGNOSIS — F411 Generalized anxiety disorder: Secondary | ICD-10-CM

## 2021-08-16 DIAGNOSIS — F4010 Social phobia, unspecified: Secondary | ICD-10-CM

## 2021-08-16 MED ORDER — LAMOTRIGINE 100 MG PO TABS: ORAL_TABLET | ORAL | 5 refills | Status: DC

## 2021-08-16 NOTE — Progress Notes (Signed)
Tiffany [name] [id number] 12/02/1950 71 y.o.  Subjective:   Patient ID:  Tiffany [name] is a 71 y.o. (DOB 05-14-1950) female.  Chief Complaint: No chief complaint on file.   HPI Tiffany [name] presents to the office today for follow-up of PTSD, insomnia, GAD, BPD 1.  Accompanied by husband.  Describes mood today as "ok". Pleasant. Tearful at times. Mood symptoms - reports depression, anxiety and irritability - "it comes and goes". Reports worry and rumination. Reporting flashbacks throughout the day. Reports mood instability -  cycling - frequency increasing. Stating "I'm not feeling too good". Willing to increase dose of Lamictal to help minimize mood instability. Seeing Dr. Rica Mote weekly. Varying interest and motivation. Taking medications as prescribed. Energy levels vary. Active, does not have a regular exercise routine - knee pain - restarting P/T.   Enjoys some usual interests and activities. Married. Lives with husband their daughter. Talking to family and friends.  Appetite adequate. Weight gain.   Sleeping better some nights than others. Averages 7 hours of broken sleep with Trazadone. Focus and concentration "not that great". Completing tasks. Managing some aspects of household. Retired.  Denies SI. Reports passive thoughts. Denies HI.  Denies AH Denies VH     Previous medications: Celexa, Zyprexa, Tegretol, Depakote, Serzone, Topamax, Seroquel, Effexor, Lexapro, Desipramine, Neurontin, Abilify, Geodon, Propanolol, Cymbalta, Cogentin, Trihexyphenadyl, Sinmmet, Provigil, Selegiline, Requip, Amantadine, Prozac, Mirapex, Azilect, Metoclopramide, Baclofen, Artane, Namenda, Latuda.    [place] Office Visit from 02/16/2021 in [place] Visit from 05/14/2019 in [place]  Total GAD-7 Score 13 [place] Office Visit from 06/07/2019 in [place]  Total Score (max 30  points ) 29      PHQ2-9    [place] Visit from 07/30/2021 in [place] Visit from 07/02/2021 in [place] from 04/02/2021 in [place] Visit from 02/16/2021 in [place] from 07/26/2020 in [place]  PHQ-2 Total Score _0 PHQ-9 Total Score _1 --      Flowsheet Row Clinical Support from 04/02/2021 in [place] [facility] from 03/21/2021 in [facility] ED from 02/03/2021 in [facility] [name] No Risk No Risk No Risk        Review of Systems:  Review of Systems  Musculoskeletal:  Negative for gait problem.  Neurological:  Negative for tremors.  Psychiatric/Behavioral:         Please refer to HPI    Medications: I have reviewed the patient's current medications.  Current Outpatient Medications  Medication Sig Dispense Refill   lamoTRIgine (LAMICTAL) 100 MG tablet Take 1/2 tablet every morning for 7 days, then increase to one tablet daily. 30 tablet 5   Azelaic Acid 15 % gel      Certolizumab Pegol (CIMZIA) 2 X 200 MG KIT See admin instructions.     cholecalciferol (VITAMIN D3) 25 MCG (1000 UNIT) tablet Take 1,000 Units by mouth daily.     lamoTRIgine (LAMICTAL) 200 MG tablet Take 1 tablet (200 mg total) by mouth at bedtime. 90 tablet 3   levothyroxine (SYNTHROID) 112 MCG tablet TAKE 1 TABLET BY MOUTH EVERY DAY BEFORE BREAKFAST 90 tablet 0   lithium carbonate 300 MG capsule TAKE 1 CAPSULE(300 MG)  BY MOUTH AT BEDTIME 90 capsule 3   LORazepam (ATIVAN) 1 MG tablet TAKE 1 TABLET(1 MG) BY MOUTH TWICE DAILY AS NEEDED 60 tablet 2   metroNIDAZOLE (METROGEL) 0.75 % gel Apply to face 1-2 times daily 45 g 0   sodium bicarbonate 650 MG tablet Take 650 mg by mouth daily.     traZODone (DESYREL) 50 MG tablet Take one to two tablets  at bedtime. 60 tablet 2   No current facility-administered medications for this visit.    Medication Side Effects: None  Allergies:  Allergies  Allergen Reactions   Aripiprazole Other (See Comments)    Parkinsonism      Lactose Intolerance (Gi) Diarrhea   Methotrexate Other (See Comments)    Hair loss, severe stomatitis     Cefdinir Diarrhea    Other reaction(s): Diarrhea Yeast infection and fever; negative c diff   Etanercept Other (See Comments)    Headaches     Exemestane Other (See Comments)    Suicidal thoughts with medication     Fluoxetine Other (See Comments)    Parkinsonism   Lactose     Other reaction(s): diarrhea Other reaction(s): diarrhea Other reaction(s): diarrhea   Methylprednisolone Sodium Succ Other (See Comments)    Agitated mania   Epinephrine Palpitations    tachycardia    Nitrofurantoin Nausea And Vomiting and Rash      Other reaction(s): rash, diarrhea Other reaction(s): rash, diarrhea Other reaction(s): rash, diarrhea Other reaction(s): rash, diarrhea Other reaction(s): rash, diarrhea Other reaction(s): rash, diarrhea    Past Medical History:  Diagnosis Date   Bipolar 1 disorder (HCC)    Breast cancer (HCC)    Chronic diarrhea    loose stools twice a day on average for years   Chronic kidney disease (CKD), stage III (moderate) ([place])    Depression 1987   Family history of breast cancer    Hospitalization or health care facility admission within last 6 months 04/2019   for fall/seizures   Malignant neoplasm of overlapping 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 1.4 cm  Grade I invasive lobular, ER and PR +/Her--, Ki 67 <5% Tried anastrozole for one month, but developed suicidal idea   Osteoarthritis, knee 09/24/2019   Xray 09/2019   Parkinson's disease ([place]) 2012   Personal history of malignant neoplasm of breast    Polyposis of  colon    Psoriatic arthritis ([place])    PTSD (post-traumatic stress disorder)    Secondary hyperparathyroidism ([place])    Tardive dyskinesia    Thyroid disease     Past Medical History, Surgical history, Social history, and Family history were reviewed and updated as appropriate.   Please see review of systems for further details on the patient's review from today.   Objective:   Physical Exam:  There were no vitals taken for this visit.  Physical Exam Constitutional:      General: She is not in acute distress. Musculoskeletal:        General: No deformity.  Neurological:     Mental Status: She is alert and oriented to person, place, and time.     Coordination: Coordination normal.  Psychiatric:        Attention and Perception: Attention and perception normal. She does not perceive auditory or visual hallucinations.        Mood and Affect: Mood normal. Mood is not anxious or depressed. Affect is  not labile, blunt, angry or inappropriate.        Speech: Speech normal.        Behavior: Behavior normal.        Thought Content: Thought content normal. Thought content is not paranoid or delusional. Thought content does not include homicidal or suicidal ideation. Thought content does not include homicidal or suicidal plan.        Cognition and Memory: Cognition and memory normal.        Judgment: Judgment normal.     Comments: Insight intact     Lab Review:     Component Value Date/Time   NA 142 07/02/2021 1048   NA 138 06/08/2019 0000   K 4.6 07/02/2021 1048   CL 109 07/02/2021 1048   CO2 25 07/02/2021 1048   GLUCOSE 87 07/02/2021 1048   BUN 28 (H) 07/02/2021 1048   BUN 29 (A) 06/08/2019 0000   CREATININE 2.36 (H) 07/02/2021 1048   CREATININE 2.29 (H) 05/22/2021 1044   CALCIUM 9.7 07/02/2021 1048   PROT 7.6 07/02/2021 1048   ALBUMIN 4.2 07/02/2021 1048   AST 13 07/02/2021 1048   ALT 14 07/02/2021 1048   ALKPHOS 125 (H) 07/02/2021 1048   BILITOT 1.2 07/02/2021 1048    GFRNONAA 22 (L) 03/21/2021 1123   GFRAA 25 (L) 09/13/2019 1243       Component Value Date/Time   WBC 12.2 (H) 07/02/2021 1048   RBC 4.54 07/02/2021 1048   HGB 13.6 07/02/2021 1048   HCT 42.0 07/02/2021 1048   PLT 283.0 07/02/2021 1048   MCV 92.5 07/02/2021 1048   MCH 29.1 03/21/2021 1123   MCHC 32.4 07/02/2021 1048   RDW 13.8 07/02/2021 1048   LYMPHSABS 3.2 07/02/2021 1048   MONOABS 0.7 07/02/2021 1048   EOSABS 0.2 07/02/2021 1048   BASOSABS 0.1 07/02/2021 1048    Lithium Lvl  Date Value Ref Range Status  05/22/2021 0.7 0.6 - 1.2 mmol/L Final     No results found for: "PHENYTOIN", "PHENOBARB", "VALPROATE", "CBMZ"   .res Assessment: Plan:   Plan:  Therapist - Andy Mitchum   Lithium 358m daily  Trazadone 570m- 1 to 2 at bedtime Lorazepam 58m73mID for anxiety - may take one tablet extra for severe anxiety symptoms. Taking one at night routinely. Lamictal 200m54m Add Lamictal 100mg958make 1/2 tablet daily x 7, then increase to one tablet daily.   Last Lithium level was 0.7 on 05/22/2021 TSH - .98 06/27/2021 BUN 29 - CR 2.29 3/14  RTC 4 weeks  Counseled patient regarding potential benefits, risks, and side effects of Lamictal to include potential risk of Stevens-Johnson syndrome. Advised patient to stop taking Lamictal and contact office immediately if rash develops and to seek urgent medical attention if rash is severe and/or spreading quickly.  Discussed potential benefits, risk, and side effects of benzodiazepines to include potential risk of tolerance and dependence, as well as possible drowsiness.  Advised patient not to drive if experiencing drowsiness and to take lowest possible effective dose to minimize risk of dependence and tolerance.   Diagnoses and all orders for this visit:  High risk medication use -     Lithium level  Bipolar I disorder, most recent episode depressed, moderate (HCC) -     lamoTRIgine (LAMICTAL) 100 MG tablet; Take 1/2 tablet every  morning for 7 days, then increase to one tablet daily.  PTSD (post-traumatic stress disorder)  Insomnia due to other mental disorder  Generalized anxiety disorder  Social anxiety disorder     Please see After Visit Summary for patient specific instructions.  Future Appointments  Date Time Provider [place]  08/17/2021  3:15 PM Carney Living, PT [facility]  08/22/2021  2:00 PM [name], PhD [facility] None  08/29/2021  9:00 AM Mance Vallejo, Berdie Ogren, NP [facility] None  08/30/2021 12:30 PM Carney Living, PT [facility]  08/31/2021  4:00 PM [name], PhD [facility] None  09/05/2021 11:20 AM Blain Hunsucker, Berdie Ogren, NP [facility] None  09/12/2021  2:40 PM Mathea Frieling, Berdie Ogren, NP [facility] None  09/19/2021 11:40 AM Kyrra Prada, Berdie Ogren, NP [facility] None  09/24/2021 11:00 AM [name], PhD [facility] None  10/04/2021  3:00 PM [name], PhD CP[facility]CP None  10/16/2021 10:00 AM [name], PhD CP[facility] None  10/23/2021 10:00 AM [name], PhD CP[facility] None  12/10/2021 10:00 AM Inda Coke, PA [facility]  12/31/2021 11:30 AM Shamleffer, Melanie Crazier, MD [facility] None  04/15/2022  1:45 PM [facility] HEALTH COACH [facility]    Orders Placed This Encounter  Procedures   Lithium level    -------------------------------

## 2021-08-17 ENCOUNTER — Encounter (HOSPITAL_BASED_OUTPATIENT_CLINIC_OR_DEPARTMENT_OTHER): Payer: Self-pay | Admitting: Physical Therapy

## 2021-08-17 ENCOUNTER — Ambulatory Visit (HOSPITAL_BASED_OUTPATIENT_CLINIC_OR_DEPARTMENT_OTHER): Payer: Medicare Other | Attending: Physician Assistant | Admitting: Physical Therapy

## 2021-08-17 DIAGNOSIS — R2681 Unsteadiness on feet: Secondary | ICD-10-CM | POA: Insufficient documentation

## 2021-08-17 DIAGNOSIS — G8929 Other chronic pain: Secondary | ICD-10-CM | POA: Insufficient documentation

## 2021-08-17 DIAGNOSIS — R2689 Other abnormalities of gait and mobility: Secondary | ICD-10-CM | POA: Diagnosis present

## 2021-08-17 DIAGNOSIS — M6281 Muscle weakness (generalized): Secondary | ICD-10-CM | POA: Diagnosis present

## 2021-08-17 DIAGNOSIS — M25561 Pain in right knee: Secondary | ICD-10-CM | POA: Insufficient documentation

## 2021-08-17 NOTE — Therapy (Signed)
OUTPATIENT PHYSICAL THERAPY LOWER EXTREMITY EVALUATION   Patient Name: Tiffany [name] MRN: [id number] DOB:06/13/1950, 71 y.o., female Today's Date: 08/17/2021    Past Medical History:  Diagnosis Date   Bipolar 1 disorder ([place])    Breast cancer ([place])    Chronic diarrhea    loose stools twice a day on average for years   Chronic kidney disease (CKD), stage III (moderate) ([place])    Depression 1987   Family history of breast cancer    Hospitalization or health care facility admission within last 6 months 04/2019   for fall/seizures   Malignant neoplasm of overlapping sites of left breast in female, estrogen receptor positive ([place]) 03/14/2016   Dx in 09/2014, s/p bilateral mastectomies and ALND, 0/10 LN. 1.4 cm  Grade I invasive lobular, ER and PR +/Her--, Ki 67 <5% Tried anastrozole for one month, but developed suicidal idea   Osteoarthritis, knee 09/24/2019   Xray 09/2019   Parkinson's disease ([place]) 2012   Personal history of malignant neoplasm of breast    Polyposis of colon    Psoriatic arthritis ([place])    PTSD (post-traumatic stress disorder)    Secondary hyperparathyroidism ([place])    Tardive dyskinesia    Thyroid disease    Past Surgical History:  Procedure Laterality Date   [place] Bilateral 09/19/2014   [place] Bilateral 1974   Patient Active Problem List   Diagnosis Date Noted   Genetic testing 06/14/2021   Family history of breast cancer 06/05/2021   Osteoarthritis, knee 09/24/2019   CKD (chronic kidney disease) stage 4, GFR 15-29 ml/min ([place]) 09/24/2019   Severe recurrent major depression without psychotic features ([place]) 09/09/2019   Bipolar I disorder, most recent episode depressed ([place])    Transaminitis    Essential hypertension    Encephalopathy 04/26/2019   Leukocytosis 04/21/2019   Thrombocytopenia ([place]) 09/04/2018   Vitamin D  deficiency 09/04/2018   Psoriatic arthritis ([place]) 09/04/2018   Tubular adenoma of colon 05/28/2017   History of colonic polyps 05/28/2017   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  Rationale for Evaluation and Treatment Rehabilitation  ONSET DATE: 3 weeks ago  SUBJECTIVE:   SUBJECTIVE STATEMENT: ***  PERTINENT HISTORY: ***  PAIN:  Are you having pain? {OPRCPAIN:27236}  PRECAUTIONS: None  WEIGHT BEARING RESTRICTIONS No  FALLS:  Has patient fallen in last 6 months? {fallsyesno:27318}  LIVING ENVIRONMENT: Lives with: {OPRC lives with:25569::"lives with their family"} Lives in: {Lives in:25570} Stairs: {opstairs:27293} Has following equipment at home: {Assistive devices:23999}  OCCUPATION: ***  PLOF: {PLOF:24004}  PATIENT GOALS ***   OBJECTIVE:   DIAGNOSTIC FINDINGS: ***  PATIENT SURVEYS:  {rehab surveys:24030}  COGNITION:  Overall cognitive status: {cognition:24006}     SENSATION: {sensation:27233}  EDEMA:  {edema:24020}  MUSCLE LENGTH: Hamstrings: Right *** deg; Left ***  deg Marcello Moores test: Right *** deg; Left *** deg  POSTURE: {posture:25561}  PALPATION: ***  LOWER EXTREMITY ROM:  Active PROM Right eval Left eval  Hip flexion    Hip extension    Hip abduction    Hip adduction    Hip internal rotation    Hip external rotation     Knee flexion 94   Knee extension -13   Ankle dorsiflexion    Ankle plantarflexion    Ankle inversion    Ankle eversion     (Blank rows = not tested)  LOWER EXTREMITY MMT:  MMT Right eval Left eval  Hip flexion 16.1 23.1  Hip extension    Hip abduction    Hip adduction 29.7 27.3  Hip internal rotation    Hip external rotation    Knee flexion    Knee extension 25.6 33  Ankle dorsiflexion    Ankle plantarflexion    Ankle inversion    Ankle eversion     (Blank rows = not tested)  LOWER EXTREMITY SPECIAL TESTS:  {LEspecialtests:26242}  FUNCTIONAL TESTS:  {Functional tests:24029}  GAIT: Distance walked: *** Assistive device utilized: {Assistive devices:23999} Level of assistance: {Levels of assistance:24026} Comments: ***    TODAY'S TREATMENT: ***   PATIENT EDUCATION:  Education details: *** Person educated: {Person educated:25204} Education method: {Education Method:25205} Education comprehension: {Education Comprehension:25206}   HOME EXERCISE PROGRAM: ***  ASSESSMENT:  CLINICAL IMPRESSION: Patient is a *** y.o. *** who was seen today for physical therapy evaluation and treatment for ***.    OBJECTIVE IMPAIRMENTS {opptimpairments:25111}.   ACTIVITY LIMITATIONS {activitylimitations:27494}  PARTICIPATION LIMITATIONS: {participationrestrictions:25113}  PERSONAL FACTORS {Personal factors:25162} are also affecting patient's functional outcome.   REHAB POTENTIAL: {rehabpotential:25112}  CLINICAL DECISION MAKING: {clinical decision making:25114}  EVALUATION COMPLEXITY: {Evaluation complexity:25115}   GOALS: Goals reviewed with patient? {yes/no:20286}  SHORT TERM GOALS: Target date: {follow up:25551}  *** Baseline: Goal status: {GOALSTATUS:25110}  2.  *** Baseline:  Goal status: {GOALSTATUS:25110}  3.  *** Baseline:  Goal status: {GOALSTATUS:25110}  4.  *** Baseline:  Goal status: {GOALSTATUS:25110}  5.  *** Baseline:  Goal  status: {GOALSTATUS:25110}  6.  *** Baseline:  Goal status: {GOALSTATUS:25110}  LONG TERM GOALS: Target date: {follow up:25551}   *** Baseline:  Goal status: {GOALSTATUS:25110}  2.  *** Baseline:  Goal status: {GOALSTATUS:25110}  3.  *** Baseline:  Goal status: {GOALSTATUS:25110}  4.  *** Baseline:  Goal status: {GOALSTATUS:25110}  5.  *** Baseline:  Goal status: {GOALSTATUS:25110}  6.  *** Baseline:  Goal status: {GOALSTATUS:25110}   PLAN: PT FREQUENCY: {rehab frequency:25116}  PT DURATION: {rehab duration:25117}  PLANNED INTERVENTIONS: {rehab planned interventions:25118::"Therapeutic exercises","Therapeutic activity","Neuromuscular re-education","Balance training","Gait training","Patient/Family education","Joint mobilization"}  PLAN FOR NEXT SESSION: ***   Carney Living, PT 08/17/2021, 3:24 PM

## 2021-08-19 ENCOUNTER — Encounter (HOSPITAL_BASED_OUTPATIENT_CLINIC_OR_DEPARTMENT_OTHER): Payer: Self-pay | Admitting: Physical Therapy

## 2021-08-22 ENCOUNTER — Ambulatory Visit (INDEPENDENT_AMBULATORY_CARE_PROVIDER_SITE_OTHER): Payer: Medicare Other | Admitting: Psychiatry

## 2021-08-22 DIAGNOSIS — F40232 Fear of other medical care: Secondary | ICD-10-CM

## 2021-08-22 DIAGNOSIS — Z853 Personal history of malignant neoplasm of breast: Secondary | ICD-10-CM

## 2021-08-22 DIAGNOSIS — L4050 Arthropathic psoriasis, unspecified: Secondary | ICD-10-CM

## 2021-08-22 DIAGNOSIS — Z8669 Personal history of other diseases of the nervous system and sense organs: Secondary | ICD-10-CM

## 2021-08-22 DIAGNOSIS — F4310 Post-traumatic stress disorder, unspecified: Secondary | ICD-10-CM | POA: Diagnosis not present

## 2021-08-22 DIAGNOSIS — N184 Chronic kidney disease, stage 4 (severe): Secondary | ICD-10-CM | POA: Diagnosis not present

## 2021-08-22 DIAGNOSIS — F3132 Bipolar disorder, current episode depressed, moderate: Secondary | ICD-10-CM

## 2021-08-22 DIAGNOSIS — Z86010 Personal history of colonic polyps: Secondary | ICD-10-CM
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
## 2021-08-29 ENCOUNTER — Ambulatory Visit: Payer: Medicare Other | Admitting: Adult Health

## 2021-08-30 ENCOUNTER — Ambulatory Visit (HOSPITAL_BASED_OUTPATIENT_CLINIC_OR_DEPARTMENT_OTHER): Payer: Medicare Other | Admitting: Physical Therapy

## 2021-08-30 ENCOUNTER — Encounter (HOSPITAL_BASED_OUTPATIENT_CLINIC_OR_DEPARTMENT_OTHER): Payer: Self-pay | Admitting: Physical Therapy

## 2021-08-30 DIAGNOSIS — R2689 Other abnormalities of gait and mobility: Secondary | ICD-10-CM

## 2021-08-30 DIAGNOSIS — M6281 Muscle weakness (generalized): Secondary | ICD-10-CM

## 2021-08-30 DIAGNOSIS — R2681 Unsteadiness on feet: Secondary | ICD-10-CM

## 2021-08-30 NOTE — Therapy (Signed)
OUTPATIENT PHYSICAL THERAPY LOWER EXTREMITY TREATMENT   Patient Name: Tiffany [name] MRN: [id number] DOB:[birth year], 71 y.o., female Today's Date: 08/31/2021   PT End of Session - 08/30/21 1327     Visit Number 2    Number of Visits 12    Date for PT Re-Evaluation 09/30/21    Authorization Type KX right now    Progress Note Due on Visit 10    PT Start Time 1232    PT Stop Time 1312    PT Time Calculation (min) 40 min    Activity Tolerance Patient tolerated treatment well    Behavior During Therapy WFL for tasks assessed/performed              Past Medical History:  Diagnosis Date   Bipolar 1 disorder ([facility])    Breast cancer ([facility])    Chronic diarrhea    loose stools twice a day on average for years   Chronic kidney disease (CKD), stage III (moderate) ([facility])    Depression 1987   Family history of breast cancer    Hospitalization or health care facility admission within last 6 months 04/2019   for fall/seizures   Malignant neoplasm of overlapping sites of left breast in female, estrogen receptor positive (H[facility]) 03/14/2016   Dx in 09/2014, s/p bilateral mastectomies and ALND, 0/10 LN. 1.4 cm  Grade I invasive lobular, ER and PR +/Her--, Ki 67 <5% Tried anastrozole for one month, but developed suicidal idea   Osteoarthritis, knee 09/24/2019   Xray 09/2019   Parkinson's disease ([facility]) 2012   Personal history of malignant neoplasm of breast    Polyposis of colon    Psoriatic arthritis (HCC)    PTSD (post-traumatic stress disorder)    Secondary hyperparathyroidism (H[facility])    Tardive dyskinesia    Thyroid disease    Past Surgical History:  Procedure Laterality Date   ABDOMINAL HYSTERECTOMY  1987   CHOLECYSTECTOMY  1979   DILATION AND CURETTAGE OF UTERUS  1973   MASTECTOMY Bilateral 09/19/2014   TONSILLECTOMY  1970   URETERAL REIMPLANTION Bilateral 1974   Patient Active Problem List   Diagnosis Date Noted   Genetic testing 06/14/2021   Family history of breast cancer  06/05/2021   Osteoarthritis, knee 09/24/2019   CKD (chronic kidney disease) stage 4, GFR 15-29 ml/min (HCC) 09/24/2019   Severe recurrent major depression without psychotic features (HCC) 09/09/2019   Bipolar I disorder, most recent episode depressed (H[facility])    Transaminitis    Essential hypertension    Encephalopathy 04/26/2019   Leukocytosis 04/21/2019   Thrombocytopenia (HCC) 09/04/2018   Vitamin D deficiency 09/04/2018   Psoriatic arthritis (HCC) 09/04/2018   Tubular adenoma of colon 05/28/2017   History of colonic polyps 05/28/2017   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 09/12/2016   Malignant neoplasm of overlapping sites of left breast in female, estrogen receptor positive (HCC) 03/14/2016   Chronic kidney disease, stage III (moderate) 01/19/2016   Tardive dyskinesia 10/18/2015   History of breast cancer left 2016 12/27/2014   Osteopenia determined by x-ray 10/31/2014   Rosacea 05/21/2007   Bipolar affective disorder, mixed (HCC) 08/16/2004   Acquired hypothyroidism 04/03/1996    PCP: Jarold Motto, PA  REFERRING PROVIDER: Jarold Motto, PA  REFERRING DIAG:  [phone] - recurrent pain of right knee  THERAPY DIAG:  Other abnormalities of gait and mobility  Unsteadiness on feet  Muscle weakness (generalized)  Rationale for Evaluation and Treatment Rehabilitation  ONSET DATE: 3 weeks ago  SUBJECTIVE:   SUBJECTIVE STATEMENT: The patient has had a recent exacerbation in R knee pain several days ago - she was going down steps without railing and came down hard on knee and twisted slightly. Her pain has been slightly worse since.  PERTINENT HISTORY: Bi-polar, CKD, depression, breast cancer, OA, Parkinson's, Psoriatic Arthritis, PTSD, tardive diskenesia,   PAIN:  Are you having pain? Yes: NPRS scale: 6/10 Pain location: right lateral knee Pain description: aching Aggravating factors: standing, walking , twisting Relieving factors:  rest  PRECAUTIONS: None  WEIGHT BEARING RESTRICTIONS No  FALLS:  Has patient fallen in last 6 months? No  LIVING ENVIRONMENT: Lives with husband  OCCUPATION: retired   PLOF: Independent uses an assitvie device at times  PATIENT GOALS   To have less pain in her knee and to get back into the gym   OBJECTIVE:   DIAGNOSTIC FINDINGS:   PATIENT SURVEYS:   COGNITION:  Overall cognitive status: Within functional limits for tasks assessed     SENSATION: WFL   POSTURE: rounded shoulders and forward head  PALPATION: Trigger points in lower IT band   LOWER EXTREMITY ROM:  Active PROM Right eval Left eval  Hip flexion    Hip extension    Hip abduction    Hip adduction    Hip internal rotation    Hip external rotation    Knee flexion 94   Knee extension -13   Ankle dorsiflexion    Ankle plantarflexion    Ankle inversion    Ankle eversion     (Blank rows = not tested)  LOWER EXTREMITY MMT:  MMT Right eval Left eval  Hip flexion 16.1 23.1  Hip extension    Hip abduction    Hip adduction 29.7 27.3  Hip internal rotation    Hip external rotation    Knee flexion    Knee extension 25.6 33  Ankle dorsiflexion    Ankle plantarflexion    Ankle inversion    Ankle eversion     (Blank rows = not tested)   GAIT: Decreased single leg stance time on the right    TODAY'S TREATMENT:  6/22 Nu-Step warmup - L3 Manual: Knee ext/flex ROM, STM to distal hamstrings Quad set, 2x10 SLR, 2x10 Supine clamshell, 2x10 green band Weight shifts - 1x15 each - PF/DF - Marching - 3-way hip    PATIENT EDUCATION:  Education details: reviewed Symptom management, heat modalities, home exercise Person educated: Patient Education method: Explanation, Demonstration, Tactile cues, Verbal cues, and Handouts Education comprehension: verbalized understanding, returned demonstration, verbal cues required, tactile cues required, and needs further education   HOME  EXERCISE PROGRAM: Has past HEP   ASSESSMENT:  CLINICAL IMPRESSION: Pt is making fair progress with her pain. She has been keeping up with her HEP and reports a decrease in lateral pain across IT band and lateral knee, but she has had some additional pain following misstep on stairs. She tolerates exercise well today and demonstrates fair balance with activities. She will continue to benefit from skilled therapy to improve motion, strength, balance, and return her to full activity.  OBJECTIVE IMPAIRMENTS Abnormal gait, decreased activity tolerance, decreased endurance, decreased mobility, difficulty walking, decreased ROM, decreased strength, increased fascial restrictions, and pain.   ACTIVITY LIMITATIONS carrying, bending, standing, squatting, stairs, transfers, and locomotion level  PARTICIPATION LIMITATIONS: cleaning, laundry, shopping, community activity, and yard work  PERSONAL FACTORS 3+ comorbidities: Psoriatic arthritis, Bi-polar parkinsosn  are also affecting patient's functional outcome.   REHAB POTENTIAL:  Good  CLINICAL DECISION MAKING: Evolving/moderate complexity  EVALUATION COMPLEXITY: Moderate   GOALS: Goals reviewed with patient? Yes  SHORT TERM GOALS: Target date: 09/09/2021  Patient will increase gross right LE strength by 5 lbs  Baseline: Goal status: INITIAL  2.  Patient will be increase knee extension passive by 5 degrees  Baseline:  Goal status: INITIAL  3.  Patient will be independent with basic HEP  Baseline:  Goal status: INITIAL  LONG TERM GOALS: Target date: 10/11/2021   Patient will stand for 20 min without pain in order to perform her ADL's  Baseline:  Goal status: INITIAL  2.  Patient will be independent with full gym program  Baseline:  Goal status: INITIAL  3.  Patient will ambulate without increased pain  Baseline:  Goal status: INITIAL    PLAN: PT FREQUENCY: 2x/week  PT DURATION: 8 weeks  PLANNED INTERVENTIONS: Therapeutic  exercises, Therapeutic activity, Neuromuscular re-education, Balance training, Gait training, Patient/Family education, Joint mobilization, Stair training, DME instructions, Aquatic Therapy, Dry Needling, Electrical stimulation, Cryotherapy, Moist heat, Ultrasound, and Manual therapy  PLAN FOR NEXT SESSION: Continue manual as indicated, progress to resistive standing exercise, balance/SLS as tolerated   Dessie Coma, PT 08/31/2021, 11:06 AM   Andree Moro SPT  08/30/2021 1:32 PM  During this treatment session, the therapist was present, participating in and directing the treatment.

## 2021-08-31 ENCOUNTER — Ambulatory Visit (INDEPENDENT_AMBULATORY_CARE_PROVIDER_SITE_OTHER): Payer: Medicare Other | Admitting: Psychiatry

## 2021-08-31 ENCOUNTER — Encounter (HOSPITAL_BASED_OUTPATIENT_CLINIC_OR_DEPARTMENT_OTHER): Payer: Self-pay | Admitting: Physical Therapy

## 2021-08-31 DIAGNOSIS — Z8669 Personal history of other diseases of the nervous system and sense organs: Secondary | ICD-10-CM | POA: Diagnosis not present

## 2021-08-31 DIAGNOSIS — R69 Illness, unspecified: Secondary | ICD-10-CM

## 2021-08-31 DIAGNOSIS — F319 Bipolar disorder, unspecified: Secondary | ICD-10-CM

## 2021-08-31 DIAGNOSIS — Z860100 Personal history of colon polyps, unspecified: Secondary | ICD-10-CM

## 2021-08-31 DIAGNOSIS — N184 Chronic kidney disease, stage 4 (severe): Secondary | ICD-10-CM | POA: Diagnosis not present

## 2021-08-31 DIAGNOSIS — F4310 Post-traumatic stress disorder, unspecified: Secondary | ICD-10-CM | POA: Diagnosis not present

## 2021-08-31 DIAGNOSIS — Z853 Personal history of malignant neoplasm of breast: Secondary | ICD-10-CM

## 2021-09-05 ENCOUNTER — Encounter: Payer: Self-pay | Admitting: Adult Health

## 2021-09-05 ENCOUNTER — Ambulatory Visit (HOSPITAL_BASED_OUTPATIENT_CLINIC_OR_DEPARTMENT_OTHER): Payer: Medicare Other | Admitting: Physical Therapy

## 2021-09-05 ENCOUNTER — Encounter (HOSPITAL_BASED_OUTPATIENT_CLINIC_OR_DEPARTMENT_OTHER): Payer: Self-pay | Admitting: Physical Therapy

## 2021-09-05 ENCOUNTER — Ambulatory Visit (INDEPENDENT_AMBULATORY_CARE_PROVIDER_SITE_OTHER): Payer: Medicare Other | Admitting: Adult Health

## 2021-09-05 DIAGNOSIS — R2681 Unsteadiness on feet: Secondary | ICD-10-CM

## 2021-09-05 DIAGNOSIS — F411 Generalized anxiety disorder: Secondary | ICD-10-CM

## 2021-09-05 DIAGNOSIS — F319 Bipolar disorder, unspecified: Secondary | ICD-10-CM

## 2021-09-05 DIAGNOSIS — R2689 Other abnormalities of gait and mobility: Secondary | ICD-10-CM

## 2021-09-05 DIAGNOSIS — F4310 Post-traumatic stress disorder, unspecified: Secondary | ICD-10-CM

## 2021-09-05 DIAGNOSIS — G4700 Insomnia, unspecified: Secondary | ICD-10-CM | POA: Diagnosis not present

## 2021-09-05 DIAGNOSIS — M6281 Muscle weakness (generalized): Secondary | ICD-10-CM

## 2021-09-05 NOTE — Therapy (Unsigned)
OUTPATIENT PHYSICAL THERAPY LOWER EXTREMITY TREATMENT   Patient Name: Tiffany [name] MRN: [id number] DOB:02-Mar-1951, 71 y.o., female Today's Date: 09/06/2021   PT End of Session - 09/05/21 1600     Visit Number 3    Number of Visits 12    Date for PT Re-Evaluation 09/30/21    Authorization Type KX right now    PT Start Time 1600    PT Stop Time 2330    PT Time Calculation (min) 43 min    Activity Tolerance Patient tolerated treatment well    Behavior During Therapy WFL for tasks assessed/performed              Past Medical History:  Diagnosis Date   Bipolar 1 disorder ([place])    Breast cancer ([place])    Chronic diarrhea    loose stools twice a day on average for years   Chronic kidney disease (CKD), stage III (moderate) ([place])    Depression 1987   Family history of breast cancer    Hospitalization or health care facility admission within last 6 months 04/2019   for fall/seizures   Malignant neoplasm of overlapping sites of left breast in female, estrogen receptor positive ([place]) 03/14/2016   Dx in 09/2014, s/p bilateral mastectomies and ALND, 0/10 LN. 1.4 cm  Grade I invasive lobular, ER and PR +/Her--, Ki 67 <5% Tried anastrozole for one month, but developed suicidal idea   Osteoarthritis, knee 09/24/2019   Xray 09/2019   Parkinson's disease ([place]) 2012   Personal history of malignant neoplasm of breast    Polyposis of colon    Psoriatic arthritis ([place])    PTSD (post-traumatic stress disorder)    Secondary hyperparathyroidism ([place])    Tardive dyskinesia    Thyroid disease    Past Surgical History:  Procedure Laterality Date   [place] Bilateral 09/19/2014   [place] Bilateral 1974   Patient Active Problem List   Diagnosis Date Noted   Genetic testing 06/14/2021   Family history of breast cancer 06/05/2021   Osteoarthritis, knee  09/24/2019   CKD (chronic kidney disease) stage 4, GFR 15-29 ml/min ([place]) 09/24/2019   Severe recurrent major depression without psychotic features ([place]) 09/09/2019   Bipolar I disorder, most recent episode depressed ([place])    Transaminitis    Essential hypertension    Encephalopathy 04/26/2019   Leukocytosis 04/21/2019   Thrombocytopenia ([place]) 09/04/2018   Vitamin D deficiency 09/04/2018   Psoriatic arthritis ([place]) 09/04/2018   Tubular adenoma of colon 05/28/2017   History of colonic polyps 05/28/2017   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 09/12/2016   Malignant neoplasm of overlapping sites of left breast in female, estrogen receptor positive ([place]) 03/14/2016   Chronic kidney disease, stage III (moderate) 01/19/2016   Tardive dyskinesia 10/18/2015   History of breast cancer left 2016 12/27/2014   Osteopenia determined by x-ray 10/31/2014   Rosacea 05/21/2007   Bipolar affective disorder, mixed ([place]) 08/16/2004   Acquired hypothyroidism 04/03/1996    PCP: Inda Coke, PA  REFERRING PROVIDER: Inda Coke, PA  REFERRING DIAG:  [phone] - recurrent pain of right knee  THERAPY DIAG:  Other abnormalities of gait and mobility  Unsteadiness on feet  Muscle weakness (generalized)  Rationale for Evaluation and Treatment Rehabilitation  ONSET DATE: 3 weeks ago  SUBJECTIVE:   SUBJECTIVE STATEMENT: The patient reports  her knee is much better. She reports decreased pain after the last visit. She is motivated to get back into the gym.   PERTINENT HISTORY: Bi-polar, CKD, depression, breast cancer, OA, Parkinson's, Psoriatic Arthritis, PTSD, tardive diskenesia,   PAIN:  Are you having pain? Yes: NPRS scale: 6/10 Pain location: right lateral knee Pain description: aching Aggravating factors: standing, walking , twisting Relieving factors: rest  PRECAUTIONS: None  WEIGHT BEARING RESTRICTIONS No  FALLS:  Has patient fallen in last 6  months? No  LIVING ENVIRONMENT: Lives with husband  OCCUPATION: retired   PLOF: Independent uses an assitvie device at times  PATIENT GOALS   To have less pain in her knee and to get back into the gym   OBJECTIVE:   DIAGNOSTIC FINDINGS:   PATIENT SURVEYS:   COGNITION:  Overall cognitive status: Within functional limits for tasks assessed     SENSATION: WFL   POSTURE: rounded shoulders and forward head  PALPATION: Trigger points in lower IT band   LOWER EXTREMITY ROM:  Active PROM Right eval Left eval  Hip flexion    Hip extension    Hip abduction    Hip adduction    Hip internal rotation    Hip external rotation    Knee flexion 94   Knee extension -13   Ankle dorsiflexion    Ankle plantarflexion    Ankle inversion    Ankle eversion     (Blank rows = not tested)  LOWER EXTREMITY MMT:  MMT Right eval Left eval  Hip flexion 16.1 23.1  Hip extension    Hip abduction    Hip adduction 29.7 27.3  Hip internal rotation    Hip external rotation    Knee flexion    Knee extension 25.6 33  Ankle dorsiflexion    Ankle plantarflexion    Ankle inversion    Ankle eversion     (Blank rows = not tested)   GAIT: Decreased single leg stance time on the right    TODAY'S TREATMENT:  6/28 Nu-step 5 min L3 for warm up  Manual: Knee ext/flex ROM, STM to distal hamstrings Quad set, 2x10 SLR, 2x10 more difficulty with SLR   Standing: march with minimal UE support 2x10   Narrow base of support 2x30 sec hold  Tandem stance 2x30 sec hold started to report mild syncope. Balance training halted. Syncope improved   Hip abduction 25lbs 3x15  Leg press 3x10 30 lbs with cuing and education for set up and progression.    6/22 Nu-Step warmup - 31mn L3 Manual: Knee ext/flex ROM, STM to distal hamstrings Quad set, 2x10 SLR, 2x10 Supine clamshell, 2x10 green band Weight shifts - 1x15 each - PF/DF - Marching - 3-way hip    PATIENT EDUCATION:  Education  details: reviewed HEP and gym equipment.  Person educated: Patient Education method: Explanation, Demonstration, Tactile cues, Verbal cues, and Handouts Education comprehension: verbalized understanding, returned demonstration, verbal cues required, tactile cues required, and needs further education   HOME EXERCISE PROGRAM: Has past HEP   ASSESSMENT:  CLINICAL IMPRESSION: The patient tolerated treatment well today. Her knee is becoming less painful. She performed gym exercises today with less difficulty. Therapy also worked with her on balance exercises She tolerated well. We will continue to progress patient back into her gym program as tolerated. Sh reported mild syncope during balance work which resolved with a short rest period. We will continue to progress back into a gym program.  OBJECTIVE IMPAIRMENTS Abnormal gait, decreased  activity tolerance, decreased endurance, decreased mobility, difficulty walking, decreased ROM, decreased strength, increased fascial restrictions, and pain.   ACTIVITY LIMITATIONS carrying, bending, standing, squatting, stairs, transfers, and locomotion level  PARTICIPATION LIMITATIONS: cleaning, laundry, shopping, community activity, and yard work  PERSONAL FACTORS 3+ comorbidities: Psoriatic arthritis, Bi-polar parkinsosn  are also affecting patient's functional outcome.   REHAB POTENTIAL: Good  CLINICAL DECISION MAKING: Evolving/moderate complexity  EVALUATION COMPLEXITY: Moderate   GOALS: Goals reviewed with patient? Yes  SHORT TERM GOALS: Target date: 09/09/2021 reviewed on 6/28  Patient will increase gross right LE strength by 5 lbs  Baseline: Goal status: ]not tested today   2.  Patient will be increase knee extension passive by 5 degrees  Baseline:  Goal status: Achieved improved to 08 degrees today   3.  Patient will be independent with basic HEP  Baseline:  Goal status: independent with basic HEP. Advancing back to gym program   LONG  TERM GOALS: Target date: 10/11/2021   Patient will stand for 20 min without pain in order to perform her ADL's  Baseline:  Goal status: INITIAL  2.  Patient will be independent with full gym program  Baseline:  Goal status: INITIAL  3.  Patient will ambulate without increased pain  Baseline:  Goal status: INITIAL    PLAN: PT FREQUENCY: 2x/week  PT DURATION: 8 weeks  PLANNED INTERVENTIONS: Therapeutic exercises, Therapeutic activity, Neuromuscular re-education, Balance training, Gait training, Patient/Family education, Joint mobilization, Stair training, DME instructions, Aquatic Therapy, Dry Needling, Electrical stimulation, Cryotherapy, Moist heat, Ultrasound, and Manual therapy  PLAN FOR NEXT SESSION: Continue manual as indicated, progress to resistive standing exercise, balance/SLS as tolerated   Carney Living, PT 09/06/2021, 8:20 AM   Jolene Schimke SPT  08/30/2021 1:32 PM  During this treatment session, the therapist was present, participating in and directing the treatment.

## 2021-09-05 NOTE — Therapy (Incomplete)
OUTPATIENT PHYSICAL THERAPY LOWER EXTREMITY TREATMENT   Patient Name: Tiffany [name] MRN: [id number] DOB:1950/06/08, 71 y.o., female Today's Date: 09/05/2021   PT End of Session - 09/05/21 1600     Visit Number 3    Number of Visits 12    Date for PT Re-Evaluation 09/30/21    Authorization Type KX right now    Activity Tolerance Patient tolerated treatment well    Behavior During Therapy [facility] for tasks assessed/performed              Past Medical History:  Diagnosis Date   Bipolar 1 disorder ([place])    Breast cancer ([place])    Chronic diarrhea    loose stools twice a day on average for years   Chronic kidney disease (CKD), stage III (moderate) ([place])    Depression 1987   Family history of breast cancer    Hospitalization or health care facility admission within last 6 months 04/2019   for fall/seizures   Malignant neoplasm of overlapping sites of left breast in female, estrogen receptor positive ([place]) 03/14/2016   Dx in 09/2014, s/p bilateral mastectomies and ALND, 0/10 LN. 1.4 cm  Grade I invasive lobular, ER and PR +/Her--, Ki 67 <5% Tried anastrozole for one month, but developed suicidal idea   Osteoarthritis, knee 09/24/2019   Xray 09/2019   Parkinson's disease ([place]) 2012   Personal history of malignant neoplasm of breast    Polyposis of colon    Psoriatic arthritis ([place])    PTSD (post-traumatic stress disorder)    Secondary hyperparathyroidism ([place])    Tardive dyskinesia    Thyroid disease    Past Surgical History:  Procedure Laterality Date   [place] Bilateral 09/19/2014   [place] Bilateral 1974   Patient Active Problem List   Diagnosis Date Noted   Genetic testing 06/14/2021   Family history of breast cancer 06/05/2021   Osteoarthritis, knee 09/24/2019   CKD (chronic kidney disease) stage 4, GFR 15-29 ml/min ([place]) 09/24/2019    Severe recurrent major depression without psychotic features ([place]) 09/09/2019   Bipolar I disorder, most recent episode depressed ([place])    Transaminitis    Essential hypertension    Encephalopathy 04/26/2019   Leukocytosis 04/21/2019   Thrombocytopenia ([place]) 09/04/2018   Vitamin D deficiency 09/04/2018   Psoriatic arthritis ([place]) 09/04/2018   Tubular adenoma of colon 05/28/2017   History of colonic polyps 05/28/2017   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  Rationale for Evaluation and Treatment Rehabilitation  ONSET DATE: 3 weeks ago  SUBJECTIVE:   SUBJECTIVE STATEMENT: The patient has had a recent exacerbation in R knee pain several days ago - she was going down steps without railing and came down hard on knee and twisted slightly. Her pain has been slightly worse  since.  PERTINENT HISTORY: Bi-polar, CKD, depression, breast cancer, OA, Parkinson's, Psoriatic Arthritis, PTSD, tardive diskenesia,   PAIN:  Are you having pain? Yes: NPRS scale: 6/10 Pain location: right lateral knee Pain description: aching Aggravating factors: standing, walking , twisting Relieving factors: rest  PRECAUTIONS: None  WEIGHT BEARING RESTRICTIONS No  FALLS:  Has patient fallen in last 6 months? No  LIVING ENVIRONMENT: Lives with husband  OCCUPATION: retired   PLOF: Independent  uses an assitvie device at times  PATIENT GOALS   To have less pain in her knee and to get back into the gym   OBJECTIVE:   DIAGNOSTIC FINDINGS:   PATIENT SURVEYS:   COGNITION:  Overall cognitive status: Within functional limits for tasks assessed     SENSATION: WFL   POSTURE: rounded shoulders and forward head  PALPATION: Trigger points in lower IT band   LOWER EXTREMITY ROM:  Active PROM Right eval Left eval  Hip flexion    Hip extension    Hip abduction    Hip adduction    Hip internal rotation    Hip external rotation    Knee flexion 94   Knee extension -13   Ankle dorsiflexion    Ankle plantarflexion    Ankle inversion    Ankle eversion     (Blank rows = not tested)  LOWER EXTREMITY MMT:  MMT Right eval Left eval  Hip flexion 16.1 23.1  Hip extension    Hip abduction    Hip adduction 29.7 27.3  Hip internal rotation    Hip external rotation    Knee flexion    Knee extension 25.6 33  Ankle dorsiflexion    Ankle plantarflexion    Ankle inversion    Ankle eversion     (Blank rows = not tested)   GAIT: Decreased single leg stance time on the right    TODAY'S TREATMENT:  6/22 Nu-Step warmup - 34mn L3 Manual: Knee ext/flex ROM, STM to distal hamstrings Quad set, 2x10 SLR, 2x10 Supine clamshell, 2x10 green band Weight shifts - 1x15 each - PF/DF - Marching - 3-way hip    PATIENT EDUCATION:  Education details: reviewed Symptom management, heat modalities, home exercise Person educated: Patient Education method: Explanation, Demonstration, Tactile cues, Verbal cues, and Handouts Education comprehension: verbalized understanding, returned demonstration, verbal cues required, tactile cues required, and needs further education   HOME EXERCISE PROGRAM: Has past HEP   ASSESSMENT:  CLINICAL IMPRESSION: The patient continues to make progress.  OBJECTIVE IMPAIRMENTS Abnormal gait, decreased activity tolerance, decreased endurance,  decreased mobility, difficulty walking, decreased ROM, decreased strength, increased fascial restrictions, and pain.   ACTIVITY LIMITATIONS carrying, bending, standing, squatting, stairs, transfers, and locomotion level  PARTICIPATION LIMITATIONS: cleaning, laundry, shopping, community activity, and yard work  PERSONAL FACTORS 3+ comorbidities: Psoriatic arthritis, Bi-polar parkinsosn  are also affecting patient's functional outcome.   REHAB POTENTIAL: Good  CLINICAL DECISION MAKING: Evolving/moderate complexity  EVALUATION COMPLEXITY: Moderate   GOALS: Goals reviewed with patient? Yes  SHORT TERM GOALS: Target date: 09/09/2021  Patient will increase gross right LE strength by 5 lbs  Baseline: Goal status: INITIAL  2.  Patient will be increase knee extension passive by 5 degrees  Baseline:  Goal status: INITIAL  3.  Patient will be independent with basic HEP  Baseline:  Goal status: INITIAL  LONG TERM GOALS: Target date: 10/11/2021   Patient will stand for 20 min without pain in order to perform her ADL's  Baseline:  Goal status: INITIAL  2.  Patient will be independent with full gym program  Baseline:  Goal status: INITIAL  3.  Patient will ambulate without increased pain  Baseline:  Goal status: INITIAL    PLAN: PT FREQUENCY: 2x/week  PT DURATION: 8 weeks  PLANNED INTERVENTIONS: Therapeutic exercises, Therapeutic activity, Neuromuscular re-education, Balance training, Gait training, Patient/Family education, Joint mobilization, Stair training, DME instructions, Aquatic Therapy, Dry Needling, Electrical stimulation, Cryotherapy, Moist heat, Ultrasound, and Manual therapy  PLAN FOR NEXT SESSION: Continue manual as indicated, progress to resistive standing exercise, balance/SLS as tolerated   Carney Living, PT 09/05/2021, 4:09 PM   Mathew Hamlet SPT  08/30/2021 1:32 PM  During this treatment session, the therapist was present, participating in and directing  the treatment.

## 2021-09-05 NOTE — Progress Notes (Signed)
Tiffany [name] [id number] [birth year] 71 y.o.  Subjective:   Patient ID:  Tiffany [name] is a 71 y.o. (DOB February 08, 1951) female.  Chief Complaint: No chief complaint on file.   HPI Tiffany [name] presents to the office today for follow-up of PTSD, insomnia, GAD, BPD 1.  Accompanied by husband.  Describes mood today as "ok". Pleasant. Tearful at times. Mood symptoms - reports depression - "sometimes". Feels anxious and irritability - "at times". Reports worry and rumination - over thinking. Reporting flashbacks. Reports mood instability - cycling - doing better overall. Stating "I feel like I'm ok". Feels like medications is helpful. Seeing Dr. Rica Mote weekly. Varying interest and motivation. Taking medications as prescribed. Energy levels vary. Active, does not have a regular exercise routine - knee pain - working with P/T.   Enjoys some usual interests and activities. Married. Lives with husband their daughter. Talking to family and friends.  Appetite improved. Weight gain 5 pounds.    Sleeps well most nights. Averages 7 hours of broken sleep with Trazadone. Focus and concentration "pretty good". Completing tasks. Managing some aspects of household. Retired.  Denies SI. Reports passive thoughts. Denies HI.  Denies AH Denies VH  Denies self harm. Denies substance use.   Previous medications: Celexa, Zyprexa, Tegretol, Depakote, Serzone, Topamax, Seroquel, Effexor, Lexapro, Desipramine, Neurontin, Abilify, Geodon, Propanolol, Cymbalta, Cogentin, Trihexyphenadyl, Sinmmet, Provigil, Selegiline, Requip, Amantadine, Prozac, Mirapex, Azilect, Metoclopramide, Baclofen, Artane, Namenda, Latuda.     [place] Office Visit from 02/16/2021 in [facility] Visit from 05/14/2019 in [place] [facility]  Total GAD-7 Score 13 [place] Office Visit from 06/07/2019 in [facility]  Total Score (max 30  points ) 29      PHQ2-9    [place] Visit from 07/30/2021 in [place] Visit from 07/02/2021 in [facility] from 04/02/2021 in [facility] Visit from 02/16/2021 in [facility] from 07/26/2020 in [place]  PHQ-2 Total Score 2 1 1 3 1   PHQ-9 Total Score 11 6 1 6  --      Flowsheet Row Clinical Support from 04/02/2021 in [facility] from 03/21/2021 in [facility] ED from 02/03/2021 in [facility] No Risk No Risk No Risk        Review of Systems:  Review of Systems  Musculoskeletal:  Negative for gait problem.  Neurological:  Negative for tremors.  Psychiatric/Behavioral:         Please refer to HPI    Medications: I have reviewed the patient's current medications.  Current Outpatient Medications  Medication Sig Dispense Refill   Azelaic Acid 15 % gel      Certolizumab Pegol (CIMZIA) 2 X 200 MG KIT See admin instructions.     cholecalciferol (VITAMIN D3) 25 MCG (1000 UNIT) tablet Take 1,000 Units by mouth daily.     lamoTRIgine (LAMICTAL) 100 MG tablet Take 1/2 tablet every morning for 7 days, then increase to one tablet daily. 30 tablet 5   lamoTRIgine (LAMICTAL) 200 MG tablet Take 1 tablet (200 mg total) by mouth at bedtime. 90 tablet 3   levothyroxine (SYNTHROID) 112 MCG tablet TAKE 1 TABLET BY MOUTH EVERY DAY BEFORE BREAKFAST 90 tablet 0   lithium carbonate 300 MG capsule TAKE 1  CAPSULE(300 MG) BY MOUTH AT BEDTIME 90 capsule 3   LORazepam (ATIVAN) 1 MG tablet TAKE 1 TABLET(1 MG) BY MOUTH TWICE DAILY AS NEEDED 60 tablet 2   metroNIDAZOLE (METROGEL) 0.75 % gel Apply to face 1-2 times daily 45 g 0   sodium bicarbonate 650 MG tablet Take 650 mg by mouth daily.     traZODone (DESYREL) 50 MG tablet Take one to two tablets  at bedtime. 60 tablet 2   No current facility-administered medications for this visit.    Medication Side Effects: None  Allergies:  Allergies  Allergen Reactions   Aripiprazole Other (See Comments)    Parkinsonism      Lactose Intolerance (Gi) Diarrhea   Methotrexate Other (See Comments)    Hair loss, severe stomatitis     Cefdinir Diarrhea    Other reaction(s): Diarrhea Yeast infection and fever; negative c diff   Etanercept Other (See Comments)    Headaches     Exemestane Other (See Comments)    Suicidal thoughts with medication     Fluoxetine Other (See Comments)    Parkinsonism   Lactose     Other reaction(s): diarrhea Other reaction(s): diarrhea Other reaction(s): diarrhea   Methylprednisolone Sodium Succ Other (See Comments)    Agitated mania   Epinephrine Palpitations    tachycardia    Nitrofurantoin Nausea And Vomiting and Rash      Other reaction(s): rash, diarrhea Other reaction(s): rash, diarrhea Other reaction(s): rash, diarrhea Other reaction(s): rash, diarrhea Other reaction(s): rash, diarrhea Other reaction(s): rash, diarrhea    Past Medical History:  Diagnosis Date   Bipolar 1 disorder (HCC)    Breast cancer (HCC)    Chronic diarrhea    loose stools twice a day on average for years   Chronic kidney disease (CKD), stage III (moderate) ([place])    Depression 1987   Family history of breast cancer    Hospitalization or health care facility admission within last 6 months 04/2019   for fall/seizures   Malignant neoplasm of overlapping sites of left breast in female, estrogen receptor positive ([place]) 03/14/2016   Dx in 09/2014, s/p bilateral mastectomies and ALND, 0/10 LN. 1.4 cm  Grade I invasive lobular, ER and PR +/Her--, Ki 67 <5% Tried anastrozole for one month, but developed suicidal idea   Osteoarthritis, knee 09/24/2019   Xray 09/2019   Parkinson's disease ([place]) 2012   Personal history of malignant neoplasm of breast    Polyposis of  colon    Psoriatic arthritis ([place])    PTSD (post-traumatic stress disorder)    Secondary hyperparathyroidism ([place])    Tardive dyskinesia    Thyroid disease     Past Medical History, Surgical history, Social history, and Family history were reviewed and updated as appropriate.   Please see review of systems for further details on the patient's review from today.   Objective:   Physical Exam:  There were no vitals taken for this visit.  Physical Exam Constitutional:      General: She is not in acute distress. Musculoskeletal:        General: No deformity.  Neurological:     Mental Status: She is alert and oriented to person, place, and time.     Coordination: Coordination normal.  Psychiatric:        Attention and Perception: Attention and perception normal. She does not perceive auditory or visual hallucinations.        Mood and Affect: Mood normal. Mood is not anxious or depressed.  Affect is not labile, blunt, angry or inappropriate.        Speech: Speech normal.        Behavior: Behavior normal.        Thought Content: Thought content normal. Thought content is not paranoid or delusional. Thought content does not include homicidal or suicidal ideation. Thought content does not include homicidal or suicidal plan.        Cognition and Memory: Cognition and memory normal.        Judgment: Judgment normal.     Comments: Insight intact     Lab Review:     Component Value Date/Time   NA 142 07/02/2021 1048   NA 138 06/08/2019 0000   K 4.6 07/02/2021 1048   CL 109 07/02/2021 1048   CO2 25 07/02/2021 1048   GLUCOSE 87 07/02/2021 1048   BUN 28 (H) 07/02/2021 1048   BUN 29 (A) 06/08/2019 0000   CREATININE 2.36 (H) 07/02/2021 1048   CREATININE 2.29 (H) 05/22/2021 1044   CALCIUM 9.7 07/02/2021 1048   PROT 7.6 07/02/2021 1048   ALBUMIN 4.2 07/02/2021 1048   AST 13 07/02/2021 1048   ALT 14 07/02/2021 1048   ALKPHOS 125 (H) 07/02/2021 1048   BILITOT 1.2 07/02/2021 1048    GFRNONAA 22 (L) 03/21/2021 1123   GFRAA 25 (L) 09/13/2019 1243       Component Value Date/Time   WBC 12.2 (H) 07/02/2021 1048   RBC 4.54 07/02/2021 1048   HGB 13.6 07/02/2021 1048   HCT 42.0 07/02/2021 1048   PLT 283.0 07/02/2021 1048   MCV 92.5 07/02/2021 1048   MCH 29.1 03/21/2021 1123   MCHC 32.4 07/02/2021 1048   RDW 13.8 07/02/2021 1048   LYMPHSABS 3.2 07/02/2021 1048   MONOABS 0.7 07/02/2021 1048   EOSABS 0.2 07/02/2021 1048   BASOSABS 0.1 07/02/2021 1048    Lithium Lvl  Date Value Ref Range Status  05/22/2021 0.7 0.6 - 1.2 mmol/L Final     No results found for: "PHENYTOIN", "PHENOBARB", "VALPROATE", "CBMZ"   .res Assessment: Plan:    Plan:  Therapist - Andy Mitchum   Lithium 312m daily  Trazadone 5245m- 1 to 2 at bedtime Lorazepam 45m57mID for anxiety - may take one tablet extra for severe anxiety symptoms. Taking one at night routinely. Lamictal 200m10m Lamictal 100mg66mry morning.  Next labs July 5  Last Lithium level was 0.7 on 05/22/2021 TSH - .98 06/27/2021 BUN 29 - CR 2.29 3/14  RTC 4 weeks  Counseled patient regarding potential benefits, risks, and side effects of Lamictal to include potential risk of Stevens-Johnson syndrome. Advised patient to stop taking Lamictal and contact office immediately if rash develops and to seek urgent medical attention if rash is severe and/or spreading quickly.  Discussed potential benefits, risk, and side effects of benzodiazepines to include potential risk of tolerance and dependence, as well as possible drowsiness.  Advised patient not to drive if experiencing drowsiness and to take lowest possible effective dose to minimize risk of dependence and tolerance.   There are no diagnoses linked to this encounter.   Please see After Visit Summary for patient specific instructions.  Future Appointments  Date Time Provider DeparEnoree8/2023  4:00 PM CarroCarney LivingDWB-REH DWB  09/12/2021  2:40 PM  Joany Khatib, ReginBerdie OgrenCP-CP None  09/17/2021 11:45 AM CarroCarney LivingDWB-REH DWB  09/19/2021 11:40 AM Ludella Pranger, ReginBerdie OgrenCP-CP None  09/24/2021 11:00 AM  [name], PhD CP-CP None  09/26/2021 11:00 AM Carney Living, PT [facility] DW[facility]  10/01/2021 11:00 AM Carney Living, PT [facility] DW[facility]  10/04/2021  3:00 PM [name], PhD CP-CP None  10/16/2021 10:00 AM [name], PhD CP-CP None  10/23/2021 10:00 AM [name], PhD CP-CP None  10/31/2021  9:00 AM [name], PhD CP-CP None  12/10/2021 10:00 AM Inda Coke, PA [facility] PEC  12/31/2021 11:30 AM Shamleffer, Melanie Crazier, MD [facility] None  04/15/2022  1:45 PM [facility] HEALTH COACH [facility] P[facility]    No orders of the defined types were placed in this encounter.   -------------------------------

## 2021-09-06 ENCOUNTER — Encounter (HOSPITAL_BASED_OUTPATIENT_CLINIC_OR_DEPARTMENT_OTHER): Payer: Self-pay | Admitting: Physical Therapy

## 2021-09-10 ENCOUNTER — Ambulatory Visit (INDEPENDENT_AMBULATORY_CARE_PROVIDER_SITE_OTHER): Payer: Medicare Other | Admitting: Psychiatry

## 2021-09-10 DIAGNOSIS — F4310 Post-traumatic stress disorder, unspecified: Secondary | ICD-10-CM | POA: Diagnosis not present

## 2021-09-10 DIAGNOSIS — Z853 Personal history of malignant neoplasm of breast: Secondary | ICD-10-CM

## 2021-09-10 DIAGNOSIS — Z86010 Personal history of colonic polyps: Secondary | ICD-10-CM

## 2021-09-10 DIAGNOSIS — F319 Bipolar disorder, unspecified: Secondary | ICD-10-CM | POA: Diagnosis not present

## 2021-09-10 DIAGNOSIS — N184 Chronic kidney disease, stage 4 (severe): Secondary | ICD-10-CM

## 2021-09-10 DIAGNOSIS — F4010 Social phobia, unspecified: Secondary | ICD-10-CM | POA: Diagnosis not present

## 2021-09-10 DIAGNOSIS — Z634 Disappearance and death of family member: Secondary | ICD-10-CM

## 2021-09-10 DIAGNOSIS — Z8669 Personal history of other diseases of the nervous system and sense organs: Secondary | ICD-10-CM

## 2021-09-10 DIAGNOSIS — L4050 Arthropathic psoriasis, unspecified: Secondary | ICD-10-CM

## 2021-09-10 DIAGNOSIS — E039 Hypothyroidism, unspecified: Secondary | ICD-10-CM

## 2021-09-10 DIAGNOSIS — R69 Illness, unspecified: Secondary | ICD-10-CM

## 2021-09-10 DIAGNOSIS — G2401 Drug induced subacute dyskinesia: Secondary | ICD-10-CM

## 2021-09-10 NOTE — Progress Notes (Signed)
Psychotherapy Progress Note [facility] [name], PhD LP  Patient ID: Tiffany [name])    MRN: [id number] Therapy format: Individual psychotherapy Date: 09/10/2021      Start: 3:16p     Stop: 4:06p     Time Spent: 50 min Location: In-person   Session narrative (presenting needs, interim history, self-report of stressors and symptoms, applications of prior therapy, status changes, and interventions made in session) As negotiated in last session's hypomania, queried whether she feels fully present (yes) and oppressed by any influences or fears in the moment (no).  Seemed to recognize the agreement made, though not seeking it when arrived today.  Soft sign of ongoing memory inconsistencies, which have been showing up more of late.  Says mood still shifts up and down in the course of a day, even 3 cycles a day.  This morning woke up wishing she had gone through with suicide, but not acting to make it so.  Did unthinkingly take 2 Advil this morning to deal with pain -- which helped again -- but had potent, apparently CKD-related SE (HA, yuck feeling), which cleared.  [name] has been engaging her goodnaturedly about "cycling", making bicycle jokes.  Feels the addition of morning lamotrigine helped initially, for about 5 days, then let up.  Probed systemic inflammation theory -- acknowledges that she only temporarily increased omega 3 then resumed normal dosing after a couple days of doubling it, having confabulated the idea that I said to stop it after 2 days.  Encouraged her to go back on the elevated dose and check with physician if there are any drawbacks, since it clearly toned down inflammatory pain, and urgent, depressive mood with it.  Publicly available guidance says '5000mg'$ /D is safe, actually.    C/o some more confusion now, which seems to correlate with inflammatory flareup.  Rheumatologist asked her to see an ortho to see if there's anything else she could do for  arthritis, but concluded all the tools he has are either in place or contraindicated.  Encouraged to address question of omega 3 benefit to him.  Acknowledges her DJD in knees has been a particular drag, says she may have exacerbated them by dancing.  Acknowledges that the proliferation of specialists and tests has been mindnumbing in itself, suggested tempted to blow off some care.  Discussed plethora of blood tests coming up this week, predicted that omega 3 and vitamin D will both be candidates to supplement more strongly, though not sure if they will be mentioned.  Hx given that her mood disorder seemed to be precipitated by her hysterectomy in 1987.  Was told she'd be "fine", then about 6 months later hospitalized for severe, suicidal depression.  That started the  "wheel" of diagnosis, medication, overmedication, iatrogenic effects, etc.  Notes by comparison how blessed she is to have current psychiatrist Gina's discretion at work on her behalf, concurred.  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  Psychiatrist at the time heeded advice but took her off precipitously, and that turned into TD.  New psychiatrist then put her in touch with [facility], which provided Ingrezza, which helped some.  Denies self-consciousness about lipsmacking at this point.  Acknowledged themes of putting up with unforeseen changes and helplessness before a parade of experts who didn't always have it right, and of running into farsighted helpers at  just the right time, seen as God's providence.  Bookmarked her interest next session in more pointedly addressing grief for sister [name].  Agreed and re-encouraged to follow through with nutritional antiinflammatory strategy and enduring commitment to safety and coping plans.    Therapeutic modalities: Cognitive Behavioral Therapy,  Solution-Oriented/Positive Psychology, Customer service manager, and Faith-sensitive  Mental Status/Observations:  Appearance:   Casual and Neat     Behavior:  Appropriate  Motor:  Normal  Speech/Language:   Clear and Coherent  Affect:  Appropriate and modulated, no lability  Mood:  dysthymic  Thought process:  normal  Thought content:    WNL  Sensory/Perceptual disturbances:    WNL  Orientation:  Fully oriented  Attention:  Good    Concentration:  Good  Memory:  grossly intact  Insight:    Good  Judgment:   Good  Impulse Control:  Variable   Risk Assessment: Danger to Self: passing, intrusive SI no intent Self-injurious Behavior: No Danger to Others: No Physical Aggression / Violence: No Duty to Warn: No Access to Firearms a concern: No  Assessment of progress:  progressing  Diagnosis:   ICD-10-CM   1. Bipolar I disorder with rapid cycling ([place])  F31.9     2. PTSD (post-traumatic stress disorder)  F43.10     3. Social anxiety disorder  F40.10     4. r/o PBA vs neurological and psychogenic disinhibition of affect  R69     5. Psoriatic arthritis ([place])  L40.50     6. Acquired hypothyroidism  E03.9     7. CKD (chronic kidney disease) stage 4, GFR 15-29 ml/min (HCC) by history  N18.4     8. History of small, chronic left frontal infarct, as assessed by neurologist  Z86.69     9. 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 History of breast cancer left 2016  Z85.3     11. History of colonic polyps - recent discovery of 9 precancerous  Z86.010     12. Tardive dyskinesia  G24.01     13. Bereavement  Z63.4      Plan:  Scrupulously follow nephrologist's advice about frequency and dose of ibuprofen Resume higher-dose omega 3 supplement, check with physician for contraindications, and rely on nutritional antiinflammatory over OTC with kidney implications unless countermanded by physician. Continue coping and anti-suicide plan as  outlined earlier.  Maintain safety plan including voice the need for safety with others and continue husband managing medication supply and any other lethal means. Be assertive as needed asking for care and concern behaviors from family Continue to address with psychiatry adequate mood stabilization  Continue to assess endocrinology intervention for possible secondary benefit to mood management  Continue to endorse active enough collaboration between specialists as re. polypharmacy and interactions of psych meds and medical conditions, and PT willing to request it if concerned Monitor for any escalating signs of PTSD, consider retry alpha-blocker if needed, since previous trial seems to have ended based on mistaken self-report Monitor for increased mood lability as either sign of PTSD, cycling mood, pseudobulbar issue, or combination Continue self-paced exercise program as intended for overall health, fitness, balance, etc  Other recommendations/advice as may be noted above Continue to utilize previously learned skills ad lib Maintain medication as prescribed and work faithfully with relevant prescriber(s) if any changes are desired or seem indicated Call the clinic on-call service, 988/hotline, 911, or present to [facility] or ER if any life-threatening psychiatric crisis Return  for session(s) already scheduled, available earlier @ PT's need.  Frequent psychiatrist schedule noted. Already scheduled visit in this office 09/19/2021.  [name], PhD [name], PhD LP Clinical Psychologist, [facility] [address] [place], [place] [address] [phone]

## 2021-09-12 ENCOUNTER — Ambulatory Visit: Payer: Medicare Other | Admitting: Adult Health

## 2021-09-13 ENCOUNTER — Encounter: Payer: Self-pay | Admitting: Adult Health

## 2021-09-13 ENCOUNTER — Encounter (HOSPITAL_BASED_OUTPATIENT_CLINIC_OR_DEPARTMENT_OTHER): Payer: Medicare Other | Admitting: Physical Therapy

## 2021-09-17 ENCOUNTER — Ambulatory Visit (HOSPITAL_BASED_OUTPATIENT_CLINIC_OR_DEPARTMENT_OTHER): Payer: Medicare Other | Admitting: Physical Therapy

## 2021-09-17 ENCOUNTER — Encounter (HOSPITAL_BASED_OUTPATIENT_CLINIC_OR_DEPARTMENT_OTHER): Payer: Self-pay

## 2021-09-19 ENCOUNTER — Encounter: Payer: Self-pay | Admitting: Adult Health

## 2021-09-19 ENCOUNTER — Ambulatory Visit (INDEPENDENT_AMBULATORY_CARE_PROVIDER_SITE_OTHER): Payer: Medicare Other | Admitting: Adult Health

## 2021-09-19 DIAGNOSIS — F4310 Post-traumatic stress disorder, unspecified: Secondary | ICD-10-CM | POA: Diagnosis not present

## 2021-09-19 DIAGNOSIS — F411 Generalized anxiety disorder: Secondary | ICD-10-CM

## 2021-09-19 DIAGNOSIS — F5105 Insomnia due to other mental disorder: Secondary | ICD-10-CM | POA: Diagnosis not present

## 2021-09-19 DIAGNOSIS — F99 Mental disorder, not otherwise specified: Secondary | ICD-10-CM

## 2021-09-19 DIAGNOSIS — F319 Bipolar disorder, unspecified: Secondary | ICD-10-CM

## 2021-09-19 NOTE — Progress Notes (Signed)
Tiffany [name] [id number] [birth year] 71 y.o.  Subjective:   Patient ID:  Tiffany [name] is a 71 y.o. (DOB 12-14-1950) female.  Chief Complaint: No chief complaint on file.   HPI Tiffany [name] presents to the office today for follow-up of PTSD, insomnia, GAD, BPD 1.   Describes mood today as "ok". Pleasant. Tearful at times. Mood symptoms - reports depression - varies throughout the day. Increased anxiety - concerned about various medical issues. Denies irritability. Reports worry and rumination. Reporting flashbacks - trauma related. Reports mood instability - rapid cycling. Stating "I'm not doing good today". Feels like medications are helpful. Seeing Dr. Rica Mote for therapy. Varying interest and motivation. Taking medications as prescribed. Energy levels vary. Active, does not have a regular exercise routine - knee pain - working with P/T.   Enjoys some usual interests and activities. Married. Lives with husband their daughter. Talking to family and friends.  Appetite improved. Weight gain 5 pounds.    Sleeps well most nights. Averages 7 to 8 hours with Trazadone. Focus and concentration difficulties. Completing tasks. Managing some aspects of household. Retired.  Denies SI. Reports passive thoughts - coming and going. Denies HI.  Denies AH Denies VH  Denies self harm. Denies substance use.   Previous medications: Celexa, Zyprexa, Tegretol, Depakote, Serzone, Topamax, Seroquel, Effexor, Lexapro, Desipramine, Neurontin, Abilify, Geodon, Propanolol, Cymbalta, Cogentin, Trihexyphenadyl, Sinmmet, Provigil, Selegiline, Requip, Amantadine, Prozac, Mirapex, Azilect, Metoclopramide, Baclofen, Artane, Namenda, Latuda.     [facility] Visit from 02/16/2021 in [place] Visit from 05/14/2019 in [place] [facility]  Total GAD-7 Score 13 [place] [facility] Visit from 06/07/2019 in [place]  Total Score (max 30 points ) 29      PHQ2-9    Hurt Visit from 07/30/2021 in [place] Visit from 07/02/2021 in [place] from 04/02/2021 in [place] Visit from 02/16/2021 in [facility] from 07/26/2020 in [place]  PHQ-2 Total Score 2 1 1 3 1   PHQ-9 Total Score 11 6 1 6  --      Flowsheet Row Clinical Support from 04/02/2021 in [facility] from 03/21/2021 in [facility] ED from 02/03/2021 in [facility] No Risk No Risk No Risk        Review of Systems:  Review of Systems  Musculoskeletal:  Negative for gait problem.  Neurological:  Negative for tremors.  Psychiatric/Behavioral:         Please refer to HPI    Medications: I have reviewed the patient's current medications.  Current Outpatient Medications  Medication Sig Dispense Refill   Azelaic Acid 15 % gel      Certolizumab Pegol (CIMZIA) 2 X 200 MG KIT See admin instructions.     cholecalciferol (VITAMIN D3) 25 MCG (1000 UNIT) tablet Take 1,000 Units by mouth daily.     lamoTRIgine (LAMICTAL) 100 MG tablet Take 1/2 tablet every morning for 7 days, then increase to one tablet daily. 30 tablet 5   lamoTRIgine (LAMICTAL) 200 MG tablet Take 1 tablet (200 mg total) by mouth at bedtime. 90 tablet 3   levothyroxine (SYNTHROID) 112 MCG tablet TAKE 1 TABLET BY MOUTH EVERY DAY BEFORE BREAKFAST 90 tablet 0   lithium carbonate 300 MG  capsule TAKE 1 CAPSULE(300 MG) BY MOUTH AT BEDTIME 90 capsule 3   LORazepam (ATIVAN) 1 MG tablet TAKE 1 TABLET(1 MG) BY MOUTH TWICE DAILY AS NEEDED 60 tablet 2   metroNIDAZOLE (METROGEL) 0.75 % gel Apply to face 1-2 times daily 45 g 0   sodium bicarbonate 650 MG tablet Take 650 mg by mouth daily.     traZODone (DESYREL) 50  MG tablet Take one to two tablets at bedtime. 60 tablet 2   No current facility-administered medications for this visit.    Medication Side Effects: None  Allergies:  Allergies  Allergen Reactions   Aripiprazole Other (See Comments)    Parkinsonism      Lactose Intolerance (Gi) Diarrhea   Methotrexate Other (See Comments)    Hair loss, severe stomatitis     Cefdinir Diarrhea    Other reaction(s): Diarrhea Yeast infection and fever; negative c diff   Etanercept Other (See Comments)    Headaches     Exemestane Other (See Comments)    Suicidal thoughts with medication     Fluoxetine Other (See Comments)    Parkinsonism   Lactose     Other reaction(s): diarrhea Other reaction(s): diarrhea Other reaction(s): diarrhea   Methylprednisolone Sodium Succ Other (See Comments)    Agitated mania   Epinephrine Palpitations    tachycardia    Nitrofurantoin Nausea And Vomiting and Rash      Other reaction(s): rash, diarrhea Other reaction(s): rash, diarrhea Other reaction(s): rash, diarrhea Other reaction(s): rash, diarrhea Other reaction(s): rash, diarrhea Other reaction(s): rash, diarrhea    Past Medical History:  Diagnosis Date   Bipolar 1 disorder (HCC)    Breast cancer (HCC)    Chronic diarrhea    loose stools twice a day on average for years   Chronic kidney disease (CKD), stage III (moderate) ([place])    Depression 1987   Family history of breast cancer    Hospitalization or health care facility admission within last 6 months 04/2019   for fall/seizures   Malignant neoplasm of overlapping sites of left breast in female, estrogen receptor positive ([place]) 03/14/2016   Dx in 09/2014, s/p bilateral mastectomies and ALND, 0/10 LN. 1.4 cm  Grade I invasive lobular, ER and PR +/Her--, Ki 67 <5% Tried anastrozole for one month, but developed suicidal idea   Osteoarthritis, knee 09/24/2019   Xray 09/2019   Parkinson's disease ([place]) 2012   Personal history of malignant  neoplasm of breast    Polyposis of colon    Psoriatic arthritis ([place])    PTSD (post-traumatic stress disorder)    Secondary hyperparathyroidism ([place])    Tardive dyskinesia    Thyroid disease     Past Medical History, Surgical history, Social history, and Family history were reviewed and updated as appropriate.   Please see review of systems for further details on the patient's review from today.   Objective:   Physical Exam:  There were no vitals taken for this visit.  Physical Exam Constitutional:      General: She is not in acute distress. Musculoskeletal:        General: No deformity.  Neurological:     Mental Status: She is alert and oriented to person, place, and time.     Coordination: Coordination normal.  Psychiatric:        Attention and Perception: Attention and perception normal. She does not perceive auditory or visual hallucinations.        Mood and Affect: Mood normal. Mood is not  anxious or depressed. Affect is not labile, blunt, angry or inappropriate.        Speech: Speech normal.        Behavior: Behavior normal.        Thought Content: Thought content normal. Thought content is not paranoid or delusional. Thought content does not include homicidal or suicidal ideation. Thought content does not include homicidal or suicidal plan.        Cognition and Memory: Cognition and memory normal.        Judgment: Judgment normal.     Comments: Insight intact     Lab Review:     Component Value Date/Time   NA 142 07/02/2021 1048   NA 138 06/08/2019 0000   K 4.6 07/02/2021 1048   CL 109 07/02/2021 1048   CO2 25 07/02/2021 1048   GLUCOSE 87 07/02/2021 1048   BUN 28 (H) 07/02/2021 1048   BUN 29 (A) 06/08/2019 0000   CREATININE 2.36 (H) 07/02/2021 1048   CREATININE 2.29 (H) 05/22/2021 1044   CALCIUM 9.7 07/02/2021 1048   PROT 7.6 07/02/2021 1048   ALBUMIN 4.2 07/02/2021 1048   AST 13 07/02/2021 1048   ALT 14 07/02/2021 1048   ALKPHOS 125 (H) 07/02/2021 1048    BILITOT 1.2 07/02/2021 1048   GFRNONAA 22 (L) 03/21/2021 1123   GFRAA 25 (L) 09/13/2019 1243       Component Value Date/Time   WBC 12.2 (H) 07/02/2021 1048   RBC 4.54 07/02/2021 1048   HGB 13.6 07/02/2021 1048   HCT 42.0 07/02/2021 1048   PLT 283.0 07/02/2021 1048   MCV 92.5 07/02/2021 1048   MCH 29.1 03/21/2021 1123   MCHC 32.4 07/02/2021 1048   RDW 13.8 07/02/2021 1048   LYMPHSABS 3.2 07/02/2021 1048   MONOABS 0.7 07/02/2021 1048   EOSABS 0.2 07/02/2021 1048   BASOSABS 0.1 07/02/2021 1048    Lithium Lvl  Date Value Ref Range Status  05/22/2021 0.7 0.6 - 1.2 mmol/L Final     No results found for: "PHENYTOIN", "PHENOBARB", "VALPROATE", "CBMZ"   .res Assessment: Plan:     Plan:  Therapist - Andy Mitchum   Lithium 325m daily  Trazadone 515m- 1 to 2 at bedtime Lorazepam 73m1073mID for anxiety - may take one tablet extra for severe anxiety symptoms. Taking one at night routinely. Lamictal 200m57m Lamictal 100mg10mry morning.  Next labs July 5  Last Lithium level was 0.7 on 05/22/2021 TSH - .98 06/27/2021 BUN 29 - CR 2.29 3/14  RTC 4 weeks  Counseled patient regarding potential benefits, risks, and side effects of Lamictal to include potential risk of Stevens-Johnson syndrome. Advised patient to stop taking Lamictal and contact office immediately if rash develops and to seek urgent medical attention if rash is severe and/or spreading quickly.  Discussed potential benefits, risk, and side effects of benzodiazepines to include potential risk of tolerance and dependence, as well as possible drowsiness.  Advised patient not to drive if experiencing drowsiness and to take lowest possible effective dose to minimize risk of dependence and tolerance.   There are no diagnoses linked to this encounter.   Please see After Visit Summary for patient specific instructions.  Future Appointments  Date Time Provider DeparAvalon7/2023 11:00 AM MitchBlanchie Serve  CP-CP None  09/26/2021 11:00 AM CarroCarney LivingDWB-REH DWB  10/01/2021 11:00 AM CarroCarney LivingDWB-REH DWB  10/04/2021  3:00 PM MitchBlanchie Serve CP-CP None  10/16/2021 10:00  AM [name], PhD CP-CP None  10/23/2021 10:00 AM [name], PhD CP-CP None  10/31/2021  9:00 AM [name], PhD CP-CP None  11/07/2021 11:20 AM Canaan Prue, Berdie Ogren, NP CP-CP None  11/21/2021 10:00 AM [name], PhD CP-CP None  12/10/2021 10:00 AM Inda Coke, PA [facility] PEC  12/31/2021 11:30 AM Shamleffer, Melanie Crazier, MD [facility] None  04/15/2022  1:45 PM [facility] HEALTH COACH [facility] P[facility]    No orders of the defined types were placed in this encounter.   -------------------------------

## 2021-09-20 ENCOUNTER — Encounter: Payer: Self-pay | Admitting: Physician Assistant

## 2021-09-20 DIAGNOSIS — F3160 Bipolar disorder, current episode mixed, unspecified: Secondary | ICD-10-CM

## 2021-09-24 ENCOUNTER — Ambulatory Visit: Payer: Medicare Other | Admitting: Psychiatry

## 2021-09-24 ENCOUNTER — Encounter: Payer: Self-pay | Admitting: Physician Assistant

## 2021-09-24 DIAGNOSIS — F4310 Post-traumatic stress disorder, unspecified: Secondary | ICD-10-CM

## 2021-09-24 NOTE — Telephone Encounter (Signed)
Spoke to pt told her Dr. Michail Sermon does not see trauma pt's on anyone else in the office. He refers them to [facility] intensive outpatient program. If you like I can send a referral there for you? Pt said yes, that would be fine. Told her okay I will place the referral and someone will contact you to schedule an appt. Pt verbalized understanding. Referral put in Epic.

## 2021-09-24 NOTE — Telephone Encounter (Signed)
Patient called stating after some research she would like a proper referral to Dr. Michail Sermon, behavioral health. Please Advise.

## 2021-09-24 NOTE — Telephone Encounter (Signed)
Spoke to pt's husband [name], he had questions about referral saying they researched on the Internet and the doctor they wanted was most appropriate. Loistine Chance as I told your wife Dr. Michail Sermon that she wanted there office contacted me and said he does not treat trauma pt's and recommended [facility] intensive program, no one in that office treats trauma pt's. [name] verbalized understanding.

## 2021-09-26 ENCOUNTER — Encounter (HOSPITAL_COMMUNITY): Payer: Self-pay

## 2021-09-26 ENCOUNTER — Encounter (HOSPITAL_BASED_OUTPATIENT_CLINIC_OR_DEPARTMENT_OTHER): Payer: Self-pay | Admitting: Physical Therapy

## 2021-09-26 ENCOUNTER — Ambulatory Visit (INDEPENDENT_AMBULATORY_CARE_PROVIDER_SITE_OTHER): Payer: Medicare Other | Admitting: Adult Health

## 2021-09-26 ENCOUNTER — Encounter: Payer: Self-pay | Admitting: Adult Health

## 2021-09-26 ENCOUNTER — Ambulatory Visit (HOSPITAL_BASED_OUTPATIENT_CLINIC_OR_DEPARTMENT_OTHER): Payer: Medicare Other | Attending: Physician Assistant | Admitting: Physical Therapy

## 2021-09-26 ENCOUNTER — Ambulatory Visit (INDEPENDENT_AMBULATORY_CARE_PROVIDER_SITE_OTHER): Payer: Medicare Other | Admitting: Licensed Clinical Social Worker

## 2021-09-26 DIAGNOSIS — F4310 Post-traumatic stress disorder, unspecified: Secondary | ICD-10-CM | POA: Diagnosis not present

## 2021-09-26 DIAGNOSIS — F411 Generalized anxiety disorder: Secondary | ICD-10-CM

## 2021-09-26 DIAGNOSIS — M25561 Pain in right knee: Secondary | ICD-10-CM | POA: Diagnosis present

## 2021-09-26 DIAGNOSIS — Z634 Disappearance and death of family member: Secondary | ICD-10-CM | POA: Diagnosis not present

## 2021-09-26 DIAGNOSIS — F5105 Insomnia due to other mental disorder: Secondary | ICD-10-CM | POA: Diagnosis not present

## 2021-09-26 DIAGNOSIS — R2681 Unsteadiness on feet: Secondary | ICD-10-CM | POA: Diagnosis present

## 2021-09-26 DIAGNOSIS — M6281 Muscle weakness (generalized): Secondary | ICD-10-CM | POA: Diagnosis present

## 2021-09-26 DIAGNOSIS — G8929 Other chronic pain: Secondary | ICD-10-CM | POA: Diagnosis present

## 2021-09-26 DIAGNOSIS — R2689 Other abnormalities of gait and mobility: Secondary | ICD-10-CM | POA: Diagnosis not present

## 2021-09-26 DIAGNOSIS — M25652 Stiffness of left hip, not elsewhere classified: Secondary | ICD-10-CM | POA: Diagnosis present

## 2021-09-26 DIAGNOSIS — F99 Mental disorder, not otherwise specified: Secondary | ICD-10-CM

## 2021-09-26 DIAGNOSIS — F319 Bipolar disorder, unspecified: Secondary | ICD-10-CM

## 2021-09-26 DIAGNOSIS — M25552 Pain in left hip: Secondary | ICD-10-CM | POA: Diagnosis present

## 2021-09-26 NOTE — Therapy (Signed)
OUTPATIENT PHYSICAL THERAPY LOWER EXTREMITY TREATMENT   Patient Name: Tiffany [name] MRN: [id number] DOB:06-Apr-1950, 71 y.o., female Today's Date: 09/26/2021   PT End of Session - 09/26/21 1117     Visit Number 4    Number of Visits 12    Date for PT Re-Evaluation 09/30/21    Authorization Type KX right now    PT Start Time 1103    PT Stop Time 1145    PT Time Calculation (min) 42 min    Activity Tolerance Patient tolerated treatment well    Behavior During Therapy WFL for tasks assessed/performed              Past Medical History:  Diagnosis Date   Bipolar 1 disorder ([place])    Breast cancer ([place])    Chronic diarrhea    loose stools twice a day on average for years   Chronic kidney disease (CKD), stage III (moderate) ([place])    Depression 1987   Family history of breast cancer    Hospitalization or health care facility admission within last 6 months 04/2019   for fall/seizures   Malignant neoplasm of overlapping sites of left breast in female, estrogen receptor positive ([place]) 03/14/2016   Dx in 09/2014, s/p bilateral mastectomies and ALND, 0/10 LN. 1.4 cm  Grade I invasive lobular, ER and PR +/Her--, Ki 67 <5% Tried anastrozole for one month, but developed suicidal idea   Osteoarthritis, knee 09/24/2019   Xray 09/2019   Parkinson's disease ([place]) 2012   Personal history of malignant neoplasm of breast    Polyposis of colon    Psoriatic arthritis ([place])    PTSD (post-traumatic stress disorder)    Secondary hyperparathyroidism ([place])    Tardive dyskinesia    Thyroid disease    Past Surgical History:  Procedure Laterality Date   [place] Bilateral 09/19/2014   [place] Bilateral 1974   Patient Active Problem List   Diagnosis Date Noted   Genetic testing 06/14/2021   Family history of breast cancer 06/05/2021   Osteoarthritis, knee  09/24/2019   CKD (chronic kidney disease) stage 4, GFR 15-29 ml/min ([place]) 09/24/2019   Severe recurrent major depression without psychotic features ([place]) 09/09/2019   Bipolar I disorder, most recent episode depressed ([place])    Transaminitis    Essential hypertension    Encephalopathy 04/26/2019   Leukocytosis 04/21/2019   Thrombocytopenia ([place]) 09/04/2018   Vitamin D deficiency 09/04/2018   Psoriatic arthritis ([place]) 09/04/2018   Tubular adenoma of colon 05/28/2017   History of colonic polyps 05/28/2017   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 09/12/2016   Malignant neoplasm of overlapping sites of left breast in female, estrogen receptor positive ([place]) 03/14/2016   Chronic kidney disease, stage III (moderate) 01/19/2016   Tardive dyskinesia 10/18/2015   History of breast cancer left 2016 12/27/2014   Osteopenia determined by x-ray 10/31/2014   Rosacea 05/21/2007   Bipolar affective disorder, mixed ([place]) 08/16/2004   Acquired hypothyroidism 04/03/1996    PCP: Inda Coke, PA  REFERRING PROVIDER: Inda Coke, PA  REFERRING DIAG:  [phone] - recurrent pain of right knee  THERAPY DIAG:  Other abnormalities of gait and mobility  Unsteadiness on feet  Muscle weakness (generalized)  Chronic pain of right knee  Pain in left hip  Stiffness of left hip, not elsewhere classified  Rationale for  Evaluation and Treatment Rehabilitation  ONSET DATE: 3 weeks ago  SUBJECTIVE:   SUBJECTIVE STATEMENT: The patient reports her knee is much better. She reports decreased pain after the last visit. She is motivated to get back into the gym.   PERTINENT HISTORY: Bi-polar, CKD, depression, breast cancer, OA, Parkinson's, Psoriatic Arthritis, PTSD, tardive diskenesia,   PAIN:  Are you having pain? Yes: NPRS scale: 6/10 Pain location: right lateral knee Pain description: aching Aggravating factors: standing, walking , twisting Relieving factors:  rest  PRECAUTIONS: None  WEIGHT BEARING RESTRICTIONS No  FALLS:  Has patient fallen in last 6 months? No  LIVING ENVIRONMENT: Lives with husband  OCCUPATION: retired   PLOF: Independent uses an assitvie device at times  PATIENT GOALS   To have less pain in her knee and to get back into the gym   OBJECTIVE:   DIAGNOSTIC FINDINGS:   PATIENT SURVEYS:   COGNITION:  Overall cognitive status: Within functional limits for tasks assessed     SENSATION: WFL   POSTURE: rounded shoulders and forward head  PALPATION: Trigger points in lower IT band   LOWER EXTREMITY ROM:  Active PROM Right eval Left eval  Hip flexion    Hip extension    Hip abduction    Hip adduction    Hip internal rotation    Hip external rotation    Knee flexion 94   Knee extension -13   Ankle dorsiflexion    Ankle plantarflexion    Ankle inversion    Ankle eversion     (Blank rows = not tested)  LOWER EXTREMITY MMT:  MMT Right eval Left eval  Hip flexion 16.1 23.1  Hip extension    Hip abduction    Hip adduction 29.7 27.3  Hip internal rotation    Hip external rotation    Knee flexion    Knee extension 25.6 33  Ankle dorsiflexion    Ankle plantarflexion    Ankle inversion    Ankle eversion     (Blank rows = not tested)   GAIT: Decreased single leg stance time on the right    TODAY'S TREATMENT: 7/19 Nu-step warm up 5 min L2  Manual: Knee extension stretch  Leg press 2x15 35 lbs  Hip abd 2x15 25lb Cable row   6/28 Nu-step 5 min L3 for warm up  Manual: Knee ext/flex ROM, STM to distal hamstrings Quad set, 2x10 SLR, 2x10 more difficulty with SLR   Standing: march with minimal UE support 2x10   Narrow base of support 2x30 sec hold  Tandem stance 2x30 sec hold started to report mild syncope. Balance training halted. Syncope improved   Hip abduction 25lbs 3x15  Leg press 3x10 30 lbs with cuing and education for set up and progression.    6/22 Nu-Step warmup -  31mn L3 Manual: Knee ext/flex ROM, STM to distal hamstrings Quad set, 2x10 SLR, 2x10 Supine clamshell, 2x10 green band Weight shifts - 1x15 each - PF/DF - Marching - 3-way hip    PATIENT EDUCATION:  Education details: reviewed HEP and gym equipment.  Person educated: Patient Education method: Explanation, Demonstration, Tactile cues, Verbal cues, and Handouts Education comprehension: verbalized understanding, returned demonstration, verbal cues required, tactile cues required, and needs further education   HOME EXERCISE PROGRAM: Has past HEP   ASSESSMENT:  CLINICAL IMPRESSION: The patient tolerated treatment well today. Her knee is becoming less painful. She performed gym exercises today with less difficulty. Therapy also worked with her on balance exercises She  tolerated well. We will continue to progress patient back into her gym program as tolerated. Sh reported mild syncope during balance work which resolved with a short rest period. We will continue to progress back into a gym program.  OBJECTIVE IMPAIRMENTS Abnormal gait, decreased activity tolerance, decreased endurance, decreased mobility, difficulty walking, decreased ROM, decreased strength, increased fascial restrictions, and pain.   ACTIVITY LIMITATIONS carrying, bending, standing, squatting, stairs, transfers, and locomotion level  PARTICIPATION LIMITATIONS: cleaning, laundry, shopping, community activity, and yard work  PERSONAL FACTORS 3+ comorbidities: Psoriatic arthritis, Bi-polar parkinsosn  are also affecting patient's functional outcome.   REHAB POTENTIAL: Good  CLINICAL DECISION MAKING: Evolving/moderate complexity  EVALUATION COMPLEXITY: Moderate   GOALS: Goals reviewed with patient? Yes  SHORT TERM GOALS: Target date: 09/09/2021 reviewed on 6/28  Patient will increase gross right LE strength by 5 lbs  Baseline: Goal status: ]not tested today   2.  Patient will be increase knee extension passive by  5 degrees  Baseline:  Goal status: Achieved improved to 08 degrees today   3.  Patient will be independent with basic HEP  Baseline:  Goal status: independent with basic HEP. Advancing back to gym program   LONG TERM GOALS: Target date: 10/11/2021   Patient will stand for 20 min without pain in order to perform her ADL's  Baseline:  Goal status: INITIAL  2.  Patient will be independent with full gym program  Baseline:  Goal status: INITIAL  3.  Patient will ambulate without increased pain  Baseline:  Goal status: INITIAL    PLAN: PT FREQUENCY: 2x/week  PT DURATION: 8 weeks  PLANNED INTERVENTIONS: Therapeutic exercises, Therapeutic activity, Neuromuscular re-education, Balance training, Gait training, Patient/Family education, Joint mobilization, Stair training, DME instructions, Aquatic Therapy, Dry Needling, Electrical stimulation, Cryotherapy, Moist heat, Ultrasound, and Manual therapy  PLAN FOR NEXT SESSION: Continue manual as indicated, progress to resistive standing exercise, balance/SLS as tolerated   Carney Living, PT 09/26/2021, 11:20 AM   Jolene Schimke SPT  08/30/2021 1:32 PM  During this treatment session, the therapist was present, participating in and directing the treatment.

## 2021-09-26 NOTE — Progress Notes (Signed)
   09/26/21 0859  Step One: Remove Access  What things should I remove or limit my access to? Get rid of pills I don't need, keep only quantities that are not dangerous ("I change my plans but always share with my husband")  What are some ways I can get rid of pills? Have someone else hold my pills  What might I use to hurt myself? stabbing myself  What other actions can I take to limit ways that I can hurt myself? Calling emergency crisis numbers (mobile crisis)  Step Two: Warning Signs  What are my specific triggers or warning signs a crisis is developing? Feeling down, sad, or crying;Feeling hopeless things won't ever get better;Feeling worthless or a failure;Feeling alone or rejected;Feeling trapped, no way out;Feeling anxious, agitated;Withdrawing, feeling isolated;Feeling angry, argumentative, or short tempered;Feeling overwhelmed by life circumstances, e.g, finances  Step Three: Distracting Activities  What engaging and calming activities can I do to distract myself? Do something nice for someone else;Go online, play video games;Listen to music;Meditate, pray;Go for a walk, exercise;Other: ("bird watching from the porch")  Step Four: Distracting People and Places  Who helps me to take my mind off of my problems? [name] (husband)  Where can I go to take my mind off my problems? On the porch watching the birds and listen to music  Step Five: Social Support  Which family members or friends can I talk to to help me feel better? [name]; daughter in law offered to help  Who can help me get through the crisis? Husband, DIL, mobile crisis, [facility] ([address])  Who is supportive? Husband, daughter  Step Six: Professional Help  Where can I get help? [facility] ([address]), 911, local hospitals

## 2021-09-26 NOTE — Plan of Care (Signed)
Developed tx plan based on pt self reported input

## 2021-09-26 NOTE — Progress Notes (Signed)
Tiffany [name] [id number] [birth year] 71 y.o.  Subjective:   Patient ID:  Tiffany [name] is a 71 y.o. (DOB 03/24/1950) female.  Chief Complaint: No chief complaint on file.   HPI Tiffany [name] presents to the office today for follow-up of PTSD, insomnia, GAD, BPD 1.   Describes mood today as "ok". Pleasant. Tearful at times. Mood symptoms - reports depression, anxiety, and irritability. Reports worry and rumination. Reporting flashbacks - trauma related. Reports mood instability - rapid cycling - 3 times a day. Stating "I'm doing ok right now". Feels like medications are helpful - willing to increase am dose of Lamictal from 147m to 1522min the morning. Was able to meet with a trauma therapist this morning and is encouraged with the possibilities. Varying interest and motivation. Taking medications as prescribed. Energy levels vary. Active, does not have a regular exercise routine - knee pain - working with P/T.   Enjoys some usual interests and activities. Married. Lives with husband their daughter. Talking to family and friends.  Appetite improved. Weight stabilized.    Sleeps well most nights. Averages 7 to 8 hours with Trazadone. Focus and concentration difficulties. Completing tasks. Managing some aspects of household. Retired.  Denies SI. Reports passive thoughts - coming and going. Denies HI.  Denies AH Denies VH  Denies self harm. Denies substance use.   Previous medications: Celexa, Zyprexa, Tegretol, Depakote, Serzone, Topamax, Seroquel, Effexor, Lexapro, Desipramine, Neurontin, Abilify, Geodon, Propanolol, Cymbalta, Cogentin, Trihexyphenadyl, Sinmmet, Provigil, Selegiline, Requip, Amantadine, Prozac, Mirapex, Azilect, Metoclopramide, Baclofen, Artane, Namenda, Latuda.   [place] Visit from [facility]20[facility] from 05/14/2019 in [facility] G[facility]-7 Score 13 21[place]ice Visit from 06/07/2019 in [facility] Score (max 30 points ) 29      PHQ2-9    [place] from 07/[place]/2021 in [facility] [place] from 07/02/2021 in [facility] 04/02/2021 in [facility] from 02/16/2021 in [facility] 07/26/2020 in [facility] Total Score 2 1 1 3 1   PHQ-9 Total Score 11 6 1 6  --      Flowsheet Row Clinical Support from 04/02/2021 in [facility]20[facility] No Risk No Risk        Review of Systems:  Review of Systems  Musculoskeletal:  Negative for gait problem.  Neurological:  Negative for tremors.  Psychiatric/Behavioral:         Please refer to HPI    Medications: I have reviewed the patient's current medications.  Current Outpatient Medications  Medication Sig Dispense Refill   Azelaic Acid 15 % gel      Certolizumab Pegol (CIMZIA) 2 X 200 MG KIT See admin instructions.     cholecalciferol (VITAMIN D3) 25 MCG (1000 UNIT) tablet Take 1,000 Units by mouth daily.     lamoTRIgine (LAMICTAL) 100 MG tablet Take 1/2 tablet every morning for 7 days, then increase to one tablet daily. 30 tablet 5   lamoTRIgine (LAMICTAL) 200 MG tablet Take 1 tablet (200 mg total) by mouth at bedtime. 90 tablet 3   levothyroxine (SYNTHROID) 112 MCG tablet TAKE 1 TABLET  BY MOUTH EVERY DAY BEFORE BREAKFAST 90 tablet 0   lithium carbonate 300 MG capsule TAKE 1 CAPSULE(300 MG) BY MOUTH AT BEDTIME 90 capsule 3   LORazepam (ATIVAN) 1 MG tablet TAKE 1 TABLET(1 MG) BY MOUTH TWICE DAILY AS NEEDED 60 tablet 2   metroNIDAZOLE (METROGEL) 0.75 % gel Apply to face 1-2 times daily 45 g 0   sodium bicarbonate 650  MG tablet Take 650 mg by mouth daily.     traZODone (DESYREL) 50 MG tablet Take one to two tablets at bedtime. 60 tablet 2   No current facility-administered medications for this visit.    Medication Side Effects: None  Allergies:  Allergies  Allergen Reactions   Aripiprazole Other (See Comments)    Parkinsonism      Lactose Intolerance (Gi) Diarrhea   Methotrexate Other (See Comments)    Hair loss, severe stomatitis     Cefdinir Diarrhea    Other reaction(s): Diarrhea Yeast infection and fever; negative c diff   Etanercept Other (See Comments)    Headaches     Exemestane Other (See Comments)    Suicidal thoughts with medication     Fluoxetine Other (See Comments)    Parkinsonism   Lactose     Other reaction(s): diarrhea Other reaction(s): diarrhea Other reaction(s): diarrhea   Methylprednisolone Sodium Succ Other (See Comments)    Agitated mania   Epinephrine Palpitations    tachycardia    Nitrofurantoin Nausea And Vomiting and Rash      Other reaction(s): rash, diarrhea Other reaction(s): rash, diarrhea Other reaction(s): rash, diarrhea Other reaction(s): rash, diarrhea Other reaction(s): rash, diarrhea Other reaction(s): rash, diarrhea    Past Medical History:  Diagnosis Date   Bipolar 1 disorder (HCC)    Breast cancer (HCC)    Chronic diarrhea    loose stools twice a day on average for years   Chronic kidney disease (CKD), stage III (moderate) ([place])    Depression 1987   Family history of breast cancer    Hospitalization or health care facility admission within last 6 months 04/2019   for fall/seizures   Malignant neoplasm of overlapping sites of left breast in female, estrogen receptor positive ([place]) 03/14/2016   Dx in 09/2014, s/p bilateral mastectomies and ALND, 0/10 LN. 1.4 cm  Grade I invasive lobular, ER and PR +/Her--, Ki 67 <5% Tried anastrozole for one month, but developed suicidal idea   Osteoarthritis, knee 09/24/2019   Xray 09/2019    Parkinson's disease ([place] 2012   Personal history of malignant neoplasm of breast    Polyposis of colon    Psoriatic arthritis ([place])    PTSD (post-traumatic stress disorder)    Secondary hyperparathyroidism ([place])    Tardive dyskinesia    Thyroid disease     Past Medical History, Surgical history, Social history, and Family history were reviewed and updated as appropriate.   Please see review of systems for further details on the patient's review from today.   Objective:   Physical Exam:  There were no vitals taken for this visit.  Physical Exam Constitutional:      General: She is not in acute distress. Musculoskeletal:        General: No deformity.  Neurological:     Mental Status: She is alert and oriented to person, place, and time.     Coordination: Coordination normal.  Psychiatric:        Attention and Perception: Attention and perception normal. She does not perceive auditory or visual hallucinations.  Mood and Affect: Mood normal. Mood is not anxious or depressed. Affect is not labile, blunt, angry or inappropriate.        Speech: Speech normal.        Behavior: Behavior normal.        Thought Content: Thought content normal. Thought content is not paranoid or delusional. Thought content does not include homicidal or suicidal ideation. Thought content does not include homicidal or suicidal plan.        Cognition and Memory: Cognition and memory normal.        Judgment: Judgment normal.     Comments: Insight intact     Lab Review:     Component Value Date/Time   NA 142 07/02/2021 1048   NA 138 06/08/2019 0000   K 4.6 07/02/2021 1048   CL 109 07/02/2021 1048   CO2 25 07/02/2021 1048   GLUCOSE 87 07/02/2021 1048   BUN 28 (H) 07/02/2021 1048   BUN 29 (A) 06/08/2019 0000   CREATININE 2.36 (H) 07/02/2021 1048   CREATININE 2.29 (H) 05/22/2021 1044   CALCIUM 9.7 07/02/2021 1048   PROT 7.6 07/02/2021 1048   ALBUMIN 4.2 07/02/2021 1048   AST 13 07/02/2021  1048   ALT 14 07/02/2021 1048   ALKPHOS 125 (H) 07/02/2021 1048   BILITOT 1.2 07/02/2021 1048   GFRNONAA 22 (L) 03/21/2021 1123   GFRAA 25 (L) 09/13/2019 1243       Component Value Date/Time   WBC 12.2 (H) 07/02/2021 1048   RBC 4.54 07/02/2021 1048   HGB 13.6 07/02/2021 1048   HCT 42.0 07/02/2021 1048   PLT 283.0 07/02/2021 1048   MCV 92.5 07/02/2021 1048   MCH 29.1 03/21/2021 1123   MCHC 32.4 07/02/2021 1048   RDW 13.8 07/02/2021 1048   LYMPHSABS 3.2 07/02/2021 1048   MONOABS 0.7 07/02/2021 1048   EOSABS 0.2 07/02/2021 1048   BASOSABS 0.1 07/02/2021 1048    Lithium Lvl  Date Value Ref Range Status  05/22/2021 0.7 0.6 - 1.2 mmol/L Final     No results found for: "PHENYTOIN", "PHENOBARB", "VALPROATE", "CBMZ"   .res Assessment: Plan:    Plan:  Therapist - Christina Hussami - [facility]  Lithium 324m daily  Trazadone 54m- 1 to 2 at bedtime Lorazepam 56m46mID for anxiety - may take one tablet extra for severe anxiety symptoms. Taking one at night routinely. Lamictal 200m19m Increase Lamictal 100mg756mmg 17my morning.  Next labs July 5  Last Lithium level was 0.7 on 05/22/2021 TSH - .98 06/27/2021 BUN 29 - CR 2.29 3/14  RTC 4 weeks  Counseled patient regarding potential benefits, risks, and side effects of Lamictal to include potential risk of Stevens-Johnson syndrome. Advised patient to stop taking Lamictal and contact office immediately if rash develops and to seek urgent medical attention if rash is severe and/or spreading quickly.  Discussed potential benefits, risk, and side effects of benzodiazepines to include potential risk of tolerance and dependence, as well as possible drowsiness.  Advised patient not to drive if experiencing drowsiness and to take lowest possible effective dose to minimize risk of dependence and tolerance.    Diagnoses and all orders for this visit:  Bipolar I disorder with rapid cycling (HCC)  DaleD (post-traumatic stress  disorder)  Insomnia due to other mental disorder  Generalized anxiety disorder     Please see After Visit Summary for patient specific instructions.  Future Appointments  Date Time Provider [place]art[place]ville/2023 11:00 AM CarrolKayleen Memos  Grayling Congress, PT [facility]  10/04/2021 12:00 PM Tiffany [name], Berdie Ogren, NP [facility] None  11/07/2021 11:20 AM Amey Hossain, Berdie Ogren, NP [facility] None  12/10/2021 10:00 AM Inda Coke, PA [facility]  12/31/2021 11:30 AM Shamleffer, Melanie Crazier, MD [facility] None  04/15/2022  1:45 PM [facility] HEALTH COACH [facility]    No orders of the defined types were placed in this encounter.   -------------------------------

## 2021-09-27 ENCOUNTER — Encounter (HOSPITAL_COMMUNITY): Payer: Self-pay
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
## 2021-10-01 ENCOUNTER — Encounter (HOSPITAL_BASED_OUTPATIENT_CLINIC_OR_DEPARTMENT_OTHER): Payer: Self-pay | Admitting: Physical Therapy

## 2021-10-01 ENCOUNTER — Ambulatory Visit (HOSPITAL_BASED_OUTPATIENT_CLINIC_OR_DEPARTMENT_OTHER): Payer: Medicare Other | Admitting: Physical Therapy

## 2021-10-01 DIAGNOSIS — G8929 Other chronic pain: Secondary | ICD-10-CM

## 2021-10-01 DIAGNOSIS — M6281 Muscle weakness (generalized): Secondary | ICD-10-CM

## 2021-10-01 DIAGNOSIS — R2689 Other abnormalities of gait and mobility: Secondary | ICD-10-CM | POA: Diagnosis not present

## 2021-10-01 DIAGNOSIS — R2681 Unsteadiness on feet: Secondary | ICD-10-CM

## 2021-10-01 NOTE — Therapy (Signed)
OUTPATIENT PHYSICAL THERAPY LOWER EXTREMITY TREATMENT   Patient Name: Tiffany [name] MRN: [id number] DOB:27-Nov-1950, 71 y.o., female Today's Date: 10/01/2021   PT End of Session - 10/01/21 1254     Visit Number 5    Number of Visits 12    Date for PT Re-Evaluation 09/30/21    Authorization Type KX right now    PT Start Time 1100    PT Stop Time 1143    PT Time Calculation (min) 43 min    Activity Tolerance Patient tolerated treatment well    Behavior During Therapy WFL for tasks assessed/performed               Past Medical History:  Diagnosis Date   Bipolar 1 disorder ([place])    Breast cancer ([place])    Chronic diarrhea    loose stools twice a day on average for years   Chronic kidney disease (CKD), stage III (moderate) ([place])    Depression 1987   Family history of breast cancer    Hospitalization or health care facility admission within last 6 months 04/2019   for fall/seizures   Malignant neoplasm of overlapping sites of left breast in female, estrogen receptor positive ([place]) 03/14/2016   Dx in 09/2014, s/p bilateral mastectomies and ALND, 0/10 LN. 1.4 cm  Grade I invasive lobular, ER and PR +/Her--, Ki 67 <5% Tried anastrozole for one month, but developed suicidal idea   Osteoarthritis, knee 09/24/2019   Xray 09/2019   Parkinson's disease ([place]) 2012   Personal history of malignant neoplasm of breast    Polyposis of colon    Psoriatic arthritis ([place])    PTSD (post-traumatic stress disorder)    Secondary hyperparathyroidism ([place])    Tardive dyskinesia    Thyroid disease    Past Surgical History:  Procedure Laterality Date   [place] Bilateral 09/19/2014   [place] Bilateral 1974   Patient Active Problem List   Diagnosis Date Noted   Genetic testing 06/14/2021   Family history of breast cancer 06/05/2021   Osteoarthritis, knee  09/24/2019   CKD (chronic kidney disease) stage 4, GFR 15-29 ml/min ([place]) 09/24/2019   Severe recurrent major depression without psychotic features ([place]) 09/09/2019   Bipolar I disorder, most recent episode depressed ([place])    Transaminitis    Essential hypertension    Encephalopathy 04/26/2019   Leukocytosis 04/21/2019   Thrombocytopenia ([place]) 09/04/2018   Vitamin D deficiency 09/04/2018   Psoriatic arthritis ([place]) 09/04/2018   Tubular adenoma of colon 05/28/2017   History of colonic polyps 05/28/2017   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 09/12/2016   Malignant neoplasm of overlapping sites of left breast in female, estrogen receptor positive ([place]) 03/14/2016   Chronic kidney disease, stage III (moderate) 01/19/2016   Tardive dyskinesia 10/18/2015   History of breast cancer left 2016 12/27/2014   Osteopenia determined by x-ray 10/31/2014   Rosacea 05/21/2007   Bipolar affective disorder, mixed ([place]) 08/16/2004   Acquired hypothyroidism 04/03/1996    PCP: Inda Coke, PA  REFERRING PROVIDER: Inda Coke, PA  REFERRING DIAG:  [phone] - recurrent pain of right knee  THERAPY DIAG:  Other abnormalities of gait and mobility  Unsteadiness on feet  Muscle weakness (generalized)  Chronic pain of right knee  Rationale for Evaluation and Treatment Rehabilitation  ONSET DATE: 3 weeks ago  SUBJECTIVE:  SUBJECTIVE STATEMENT: The patient reports her knee is much better. She reports decreased pain after the last visit. She is motivated to get back into the gym.   PERTINENT HISTORY: Bi-polar, CKD, depression, breast cancer, OA, Parkinson's, Psoriatic Arthritis, PTSD, tardive diskenesia,   PAIN:  Are you having pain? Yes: NPRS scale: 6/10 Pain location: right lateral knee Pain description: aching Aggravating factors: standing, walking , twisting Relieving factors: rest  PRECAUTIONS: None  WEIGHT BEARING RESTRICTIONS No  FALLS:   Has patient fallen in last 6 months? No  LIVING ENVIRONMENT: Lives with husband  OCCUPATION: retired   PLOF: Independent uses an assitvie device at times  PATIENT GOALS   To have less pain in her knee and to get back into the gym   OBJECTIVE:   DIAGNOSTIC FINDINGS:   PATIENT SURVEYS:   COGNITION:  Overall cognitive status: Within functional limits for tasks assessed     SENSATION: WFL   POSTURE: rounded shoulders and forward head  PALPATION: Trigger points in lower IT band   LOWER EXTREMITY ROM:  Active PROM Right eval Left eval  Hip flexion    Hip extension    Hip abduction    Hip adduction    Hip internal rotation    Hip external rotation    Knee flexion 94   Knee extension -13   Ankle dorsiflexion    Ankle plantarflexion    Ankle inversion    Ankle eversion     (Blank rows = not tested)  LOWER EXTREMITY MMT:  MMT Right eval Left eval  Hip flexion 16.1 23.1  Hip extension    Hip abduction    Hip adduction 29.7 27.3  Hip internal rotation    Hip external rotation    Knee flexion    Knee extension 25.6 33  Ankle dorsiflexion    Ankle plantarflexion    Ankle inversion    Ankle eversion     (Blank rows = not tested)   GAIT: Decreased single leg stance time on the right    TODAY'S TREATMENT: 7/24 Nu-step warm up 5 min L2  Manual: Knee extension stretch Lat pull down from cable 2x10 10lbs  Tricpes extension 2x10 5 lbs min cuing for technique  Reviewed 2 different lat pull down machines. She did better with the lifestyles machine. The seat on the cybex is too low. 2x10 10lbs each  Quad set 2x10  SLR 2x6      7/19 Nu-step warm up 5 min L2  Manual: Knee extension stretch  Leg press 2x15 35 lbs  Hip abd 2x15 25lb Cable row   6/28 Nu-step 5 min L3 for warm up  Manual: Knee ext/flex ROM, STM to distal hamstrings Quad set, 2x10 SLR, 2x10 more difficulty with SLR   Standing: march with minimal UE support 2x10   Narrow base of  support 2x30 sec hold  Tandem stance 2x30 sec hold started to report mild syncope. Balance training halted. Syncope improved   Hip abduction 25lbs 3x15  Leg press 3x10 30 lbs with cuing and education for set up and progression.    6/22 Nu-Step warmup - 93mn L3 Manual: Knee ext/flex ROM, STM to distal hamstrings Quad set, 2x10 SLR, 2x10 Supine clamshell, 2x10 green band Weight shifts - 1x15 each - PF/DF - Marching - 3-way hip    PATIENT EDUCATION:  Education details: reviewed HEP and gym equipment.  Person educated: Patient Education method: Explanation, Demonstration, Tactile cues, Verbal cues, and Handouts Education comprehension: verbalized understanding, returned demonstration,  verbal cues required, tactile cues required, and needs further education   HOME EXERCISE PROGRAM: Has past HEP   ASSESSMENT:  CLINICAL IMPRESSION: The patient continues to make good progress. She tolerated gym exercises well. We reviewed exercises that wore more for posture and upper body as we continue to build her a gym program. She had no increase in pain. She was able to use the lifestyles lat pull down machine but the seat on the cybex was too low. We will continue to go throguh gym machines over the next few visits.    OBJECTIVE IMPAIRMENTS Abnormal gait, decreased activity tolerance, decreased endurance, decreased mobility, difficulty walking, decreased ROM, decreased strength, increased fascial restrictions, and pain.   ACTIVITY LIMITATIONS carrying, bending, standing, squatting, stairs, transfers, and locomotion level  PARTICIPATION LIMITATIONS: cleaning, laundry, shopping, community activity, and yard work  PERSONAL FACTORS 3+ comorbidities: Psoriatic arthritis, Bi-polar parkinsosn  are also affecting patient's functional outcome.   REHAB POTENTIAL: Good  CLINICAL DECISION MAKING: Evolving/moderate complexity  EVALUATION COMPLEXITY: Moderate   GOALS: Goals reviewed with patient?  Yes  SHORT TERM GOALS: Target date: 09/09/2021 reviewed on 6/28  Patient will increase gross right LE strength by 5 lbs  Baseline: Goal status: ]not tested today   2.  Patient will be increase knee extension passive by 5 degrees  Baseline:  Goal status: Achieved improved to 08 degrees today   3.  Patient will be independent with basic HEP  Baseline:  Goal status: independent with basic HEP. Advancing back to gym program   LONG TERM GOALS: Target date: 10/11/2021   Patient will stand for 20 min without pain in order to perform her ADL's  Baseline:  Goal status: INITIAL  2.  Patient will be independent with full gym program  Baseline:  Goal status: INITIAL  3.  Patient will ambulate without increased pain  Baseline:  Goal status: INITIAL    PLAN: PT FREQUENCY: 2x/week  PT DURATION: 8 weeks  PLANNED INTERVENTIONS: Therapeutic exercises, Therapeutic activity, Neuromuscular re-education, Balance training, Gait training, Patient/Family education, Joint mobilization, Stair training, DME instructions, Aquatic Therapy, Dry Needling, Electrical stimulation, Cryotherapy, Moist heat, Ultrasound, and Manual therapy  PLAN FOR NEXT SESSION: Continue manual as indicated, progress to resistive standing exercise, balance/SLS as tolerated   Carney Living, PT 10/01/2021, 12:55 PM

## 2021-10-02 ENCOUNTER — Ambulatory Visit: Payer: Medicare Other | Admitting: Adult Health

## 2021-10-04 ENCOUNTER — Ambulatory Visit (INDEPENDENT_AMBULATORY_CARE_PROVIDER_SITE_OTHER): Payer: Medicare Other | Admitting: Adult Health

## 2021-10-04 ENCOUNTER — Ambulatory Visit: Payer: Medicare Other | Admitting: Psychiatry

## 2021-10-04 ENCOUNTER — Encounter: Payer: Self-pay | Admitting: Adult Health

## 2021-10-04 DIAGNOSIS — F3132 Bipolar disorder, current episode depressed, moderate: Secondary | ICD-10-CM | POA: Diagnosis not present

## 2021-10-04 DIAGNOSIS — F5105 Insomnia due to other mental disorder: Secondary | ICD-10-CM

## 2021-10-04 DIAGNOSIS — F411 Generalized anxiety disorder: Secondary | ICD-10-CM

## 2021-10-04 DIAGNOSIS — F4310 Post-traumatic stress disorder, unspecified: Secondary | ICD-10-CM

## 2021-10-04 DIAGNOSIS — F99 Mental disorder, not otherwise specified: Secondary | ICD-10-CM

## 2021-10-04 MED ORDER — LORAZEPAM 1 MG PO TABS: ORAL_TABLET | ORAL | 2 refills | Status: DC

## 2021-10-04 MED ORDER — LAMOTRIGINE 100 MG PO TABS: ORAL_TABLET | ORAL | 1 refills | Status: DC

## 2021-10-04 NOTE — Progress Notes (Signed)
Tiffany [name] [id number] [birth year] 71 y.o.  Subjective:   Patient ID:  Tiffany [name] is a 71 y.o. (DOB January 10, 1951) female.  Chief Complaint: No chief complaint on file.   HPI Kelliann Pendergraph Trebilcock presents to the office today for follow-up of  PTSD, insomnia, GAD, BPD 1.   Describes mood today as "ok". Pleasant. Tearful at times. Mood symptoms - reports decreased depression, anxiety, and irritability. Reports worry and rumination. Reporting flashbacks. Reports decreased mood instability. Has tolerated the increase in Lamictal from 100 to 182m in the morning. Stating "I feel more evened out". Varying interest and motivation. Taking medications as prescribed. Energy levels vary. Active, does not have a regular exercise routine - knee pain - working with P/T.   Enjoys some usual interests and activities. Married. Lives with husband their daughter. Talking to family and friends.  Appetite improved. Weight stabilized.    Sleeps well most nights. Averages 7 to 8 hours with Trazadone. Focus and concentration difficulties. Completing tasks. Managing some aspects of household. Retired.  Denies SI. Reports passive thoughts - every day, but more vague. Denies HI.  Denies AH Denies VH  Denies self harm. Denies substance use.   Previous medications: Celexa, Zyprexa, Tegretol, Depakote, Serzone, Topamax, Seroquel, Effexor, Lexapro, Desipramine, Neurontin, Abilify, Geodon, Propanolol, Cymbalta, Cogentin, Trihexyphenadyl, Sinmmet, Provigil, Selegiline, Requip, Amantadine, Prozac, Mirapex, Azilect, Metoclopramide, Baclofen, Artane, Namenda, Latuda.   [place]2019 [place] Total GAD-7 Score 13 2[place]onaOffice Visit from 06/07/2019 in [place] Total Score (max 30 points ) 29      PHQ2-9    FT[place]Visit  from 07/30/2021 [place] 07[place]02[place]2021 [place] 02[place]16[place]2021 [place] PHQ-2 Total Score _0 PHQ-9 Total Score _1 --      Flowsheet Row Clinical Support from 04/02/2021 in [facility] from 03/21/2021 [facility] from [facility]20[facility] [place] [facility] No Risk        Review of Systems:  Review of Systems  Medications: I have reviewed the patient's current medications.  Current Outpatient Medications  Medication Sig Dispense Refill   Azelaic Acid 15 % gel      Certolizumab Pegol (CIMZIA) 2 X 200 MG KIT See admin instructions.     cholecalciferol (VITAMIN D3) 25 MCG (1000 UNIT) tablet Take 1,000 Units by mouth daily.     lamoTRIgine (LAMICTAL) 100 MG tablet Take one tablet in the morning and take one tablet at lunch. 180 tablet 1   lamoTRIgine (LAMICTAL) 200 MG tablet Take 1 tablet (200 mg total) by mouth at bedtime. 90 tablet 3   levothyroxine (SYNTHROID) 112 MCG tablet TAKE 1 TABLET BY MOUTH EVERY DAY BEFORE BREAKFAST 90 tablet 0   lithium carbonate 300 MG capsule TAKE 1 CAPSULE(300 MG) BY MOUTH AT BEDTIME 90 capsule 3   LORazepam (ATIVAN) 1 MG tablet TAKE 1 TABLET(1 MG) BY MOUTH TWICE DAILY AS NEEDED 60 tablet 2   metroNIDAZOLE (METROGEL) 0.75 % gel Apply to face 1-2 times daily  45 g 0   sodium bicarbonate 650 MG tablet Take 650 mg by mouth daily.     traZODone (DESYREL) 50 MG tablet Take one to two tablets at bedtime. 60 tablet 2   No current facility-administered medications for this visit.    Medication Side Effects: None  Allergies:  Allergies  Allergen Reactions   Aripiprazole Other (See Comments)     Parkinsonism      Lactose Intolerance (Gi) Diarrhea   Methotrexate Other (See Comments)    Hair loss, severe stomatitis     Cefdinir Diarrhea    Other reaction(s): Diarrhea Yeast infection and fever; negative c diff   Etanercept Other (See Comments)    Headaches     Exemestane Other (See Comments)    Suicidal thoughts with medication     Fluoxetine Other (See Comments)    Parkinsonism   Lactose     Other reaction(s): diarrhea Other reaction(s): diarrhea Other reaction(s): diarrhea   Methylprednisolone Sodium Succ Other (See Comments)    Agitated mania   Epinephrine Palpitations    tachycardia    Nitrofurantoin Nausea And Vomiting and Rash      Other reaction(s): rash, diarrhea Other reaction(s): rash, diarrhea Other reaction(s): rash, diarrhea Other reaction(s): rash, diarrhea Other reaction(s): rash, diarrhea Other reaction(s): rash, diarrhea    Past Medical History:  Diagnosis Date   Bipolar 1 disorder (HCC)    Breast cancer (HCC)    Chronic diarrhea    loose stools twice a day on average for years   Chronic kidney disease (CKD), stage III (moderate) ([place])    Depression 1987   Family history of breast cancer    Hospitalization or health care facility admission within last 6 months 04/2019   for fall/seizures   Malignant neoplasm of overlapping sites of left breast in female, estrogen receptor positive ([place]) 03/14/2016   Dx in 09/2014, s/p bilateral mastectomies and ALND, 0/10 LN. 1.4 cm  Grade I invasive lobular, ER and PR +/Her--, Ki 67 <5% Tried anastrozole for one month, but developed suicidal idea   Osteoarthritis, knee 09/24/2019   Xray 09/2019   Parkinson's disease ([place]) 2012   Personal history of malignant neoplasm of breast    Polyposis of colon    Psoriatic arthritis ([place])    PTSD (post-traumatic stress disorder)    Secondary hyperparathyroidism ([place])    Tardive dyskinesia    Thyroid disease     Past Medical History, Surgical history, Social  history, and Family history were reviewed and updated as appropriate.   Please see review of systems for further details on the patient's review from today.   Objective:   Physical Exam:  There were no vitals taken for this visit.  Physical Exam  Lab Review:     Component Value Date/Time   NA 142 07/02/2021 1048   NA 138 06/08/2019 0000   K 4.6 07/02/2021 1048   CL 109 07/02/2021 1048   CO2 25 07/02/2021 1048   GLUCOSE 87 07/02/2021 1048   BUN 28 (H) 07/02/2021 1048   BUN 29 (A) 06/08/2019 0000   CREATININE 2.36 (H) 07/02/2021 1048   CREATININE 2.29 (H) 05/22/2021 1044   CALCIUM 9.7 07/02/2021 1048   PROT 7.6 07/02/2021 1048   ALBUMIN 4.2 07/02/2021 1048   AST 13 07/02/2021 1048   ALT 14 07/02/2021 1048   ALKPHOS 125 (H) 07/02/2021 1048   BILITOT 1.2 07/02/2021 1048   GFRNONAA 22 (L) 03/21/2021 1123   GFRAA 25 (L) 09/13/2019 1243  Component Value Date/Time   WBC 12.2 (H) 07/02/2021 1048   RBC 4.54 07/02/2021 1048   HGB 13.6 07/02/2021 1048   HCT 42.0 07/02/2021 1048   PLT 283.0 07/02/2021 1048   MCV 92.5 07/02/2021 1048   MCH 29.1 03/21/2021 1123   MCHC 32.4 07/02/2021 1048   RDW 13.8 07/02/2021 1048   LYMPHSABS 3.2 07/02/2021 1048   MONOABS 0.7 07/02/2021 1048   EOSABS 0.2 07/02/2021 1048   BASOSABS 0.1 07/02/2021 1048    Lithium Lvl  Date Value Ref Range Status  05/22/2021 0.7 0.6 - 1.2 mmol/L Final     No results found for: "PHENYTOIN", "PHENOBARB", "VALPROATE", "CBMZ"   .res Assessment: Plan:    Plan:  Therapist - Christina Hussami - [facility]  Lithium 336m daily  Trazadone 517m- 1 to 2 at bedtime Lorazepam 66m13mID for anxiety - may take one tablet extra for severe anxiety symptoms. Taking one at night routinely. Lamictal 200m22m Lamictal 150mg105mry morning.  Next labs July 5  Last Lithium level was 0.7 on 05/22/2021 TSH - .98 06/27/2021 BUN 29 - CR 2.29 3/14  RTC 4 weeks  Counseled patient regarding potential benefits, risks,  and side effects of Lamictal to include potential risk of Stevens-Johnson syndrome. Advised patient to stop taking Lamictal and contact office immediately if rash develops and to seek urgent medical attention if rash is severe and/or spreading quickly.  Discussed potential benefits, risk, and side effects of benzodiazepines to include potential risk of tolerance and dependence, as well as possible drowsiness.  Advised patient not to drive if experiencing drowsiness and to take lowest possible effective dose to minimize risk of dependence and tolerance.  Diagnoses and all orders for this visit:  Bipolar I disorder, most recent episode depressed, moderate (HCC) -     lamoTRIgine (LAMICTAL) 100 MG tablet; Take one tablet in the morning and take one tablet at lunch.  Generalized anxiety disorder -     LORazepam (ATIVAN) 1 MG tablet; TAKE 1 TABLET(1 MG) BY MOUTH TWICE DAILY AS NEEDED  PTSD (post-traumatic stress disorder)  Insomnia due to other mental disorder     Please see After Visit Summary for patient specific instructions.  Future Appointments  Date Time Provider DeparTaylor/2023  1:45 PM CarroCarney LivingDWB-REH DWB  11/02/2021 11:00 AM Hussami, ChrisRachel BoW BH-OPGSO None  11/07/2021 11:20 AM Hermilo Dutter, ReginBerdie OgrenCP-CP None  12/10/2021 10:00 AM WorleInda CokeLBPC-HPC PEC  12/31/2021 11:30 AM Shamleffer, IbtehMelanie CrazierLBPC-LBENDO None  04/15/2022  1:45 PM LBPC-HPC HEALTH COACH LBPC-HPC PEC    No orders of the defined types were placed in this encounter.   -------------------------------

## 2021-10-11 ENCOUNTER — Encounter: Payer: Self-pay | Admitting: Adult Health

## 2021-10-11 ENCOUNTER — Ambulatory Visit (INDEPENDENT_AMBULATORY_CARE_PROVIDER_SITE_OTHER): Payer: Medicare Other | Admitting: Adult Health

## 2021-10-11 DIAGNOSIS — F411 Generalized anxiety disorder: Secondary | ICD-10-CM | POA: Diagnosis not present

## 2021-10-11 DIAGNOSIS — G4700 Insomnia, unspecified: Secondary | ICD-10-CM

## 2021-10-11 DIAGNOSIS — F3132 Bipolar disorder, current episode depressed, moderate: Secondary | ICD-10-CM

## 2021-10-11 DIAGNOSIS — F4310 Post-traumatic stress disorder, unspecified: Secondary | ICD-10-CM | POA: Diagnosis not present

## 2021-10-11 DIAGNOSIS — F99 Mental disorder, not otherwise specified: Secondary | ICD-10-CM

## 2021-10-11 DIAGNOSIS — F5105 Insomnia due to other mental disorder: Secondary | ICD-10-CM

## 2021-10-11 MED ORDER — TRAZODONE HCL 50 MG PO TABS: ORAL_TABLET | ORAL | 3 refills | Status: DC

## 2021-10-11 NOTE — Progress Notes (Signed)
Tiffany [name] [id number] 08-Jun-1950 71 y.o.  Subjective:   Patient ID:  Tiffany [name] is a 71 y.o. (DOB 05-06-1950) female.  Chief Complaint: No chief complaint on file.   HPI Bekka Qian Coone presents to the office today for follow-up of PTSD, insomnia, GAD, BPD 1.  Accompanied by husband.  Describes mood today as "ok". Pleasant. Tearful at times. Mood symptoms - reports increased depression, anxiety, and irritability. Reports worry and rumination. Reports obsessive thoughts - irrational thoughts. Reporting flashbacks. Reports mood instability - more depressed over the past week. Episodes of mixed cycling have decreased. Stating "I feel more down". Continues to look for a trauma therapist. Varying interest and motivation. Taking medications as prescribed. Energy levels vary. Active, does not have a regular exercise routine - knee pain - working with P/T.   Enjoys some usual interests and activities. Married. Lives with husband their daughter. Talking to family and friends.  Appetite improved. Weight stabilized.    Sleeps well most nights. Averages 7 to 8 hours with Trazadone. Focus and concentration difficulties. Completing tasks. Managing some aspects of household. Retired.  Denies SI. Reports passive thoughts - every day. She and husband have a safety plan in place. Denies HI.  Denies AH Denies VH  Denies self harm. Denies substance use.   Previous medications: Celexa, Zyprexa, Tegretol, Depakote, Serzone, Topamax, Seroquel, Effexor, Lexapro, Desipramine, Neurontin, Abilify, Geodon, Propanolol, Cymbalta, Cogentin, Trihexyphenadyl, Sinmmet, Provigil, Selegiline, Requip, Amantadine, Prozac, Mirapex, Azilect, Metoclopramide, Baclofen, Artane, Namenda, Latuda.     [place] Office Visit from 02/16/2021 in [place] Visit from 05/14/2019 in [place]  Total GAD-7 Score 13 [place] Office  Visit from 06/07/2019 in [place]  Total Score (max 30 points ) 29      PHQ2-9    [place] Visit from 07/30/2021 in [place] Visit from 07/02/2021 in [place] from 04/02/2021 in [place] Visit from 02/16/2021 in [place] from 07/26/2020 in [place]  PHQ-2 Total Score 2 1 1 3 1   PHQ-9 Total Score 11 6 1 6  --      Flowsheet Row Clinical Support from 04/02/2021 in [place] ED from 03/21/2021 in [facility] ED from 02/03/2021 in [facility] [place] No Risk No Risk No Risk        Review of Systems:  Review of Systems  Musculoskeletal:  Negative for gait problem.  Neurological:  Negative for tremors.  Psychiatric/Behavioral:         Please refer to HPI    Medications: I have reviewed the patient's current medications.  Current Outpatient Medications  Medication Sig Dispense Refill   Azelaic Acid 15 % gel      Certolizumab Pegol (CIMZIA) 2 X 200 MG KIT See admin instructions.     cholecalciferol (VITAMIN D3) 25 MCG (1000 UNIT) tablet Take 1,000 Units by mouth daily.     lamoTRIgine (LAMICTAL) 100 MG tablet Take one tablet in the morning and take one tablet at lunch. 180 tablet 1   lamoTRIgine (LAMICTAL) 200 MG tablet Take 1 tablet (200 mg total) by mouth at bedtime. 90 tablet 3   levothyroxine (SYNTHROID) 112 MCG tablet TAKE 1 TABLET BY MOUTH EVERY DAY BEFORE BREAKFAST 90 tablet 0  lithium carbonate 300 MG capsule TAKE 1 CAPSULE(300 MG) BY MOUTH AT BEDTIME 90 capsule 3   LORazepam (ATIVAN) 1 MG tablet TAKE 1 TABLET(1 MG) BY MOUTH TWICE DAILY AS NEEDED 60 tablet 2   metroNIDAZOLE (METROGEL) 0.75 % gel Apply to face 1-2 times daily 45 g 0   sodium bicarbonate 650 MG tablet Take 650 mg by mouth daily.      traZODone (DESYREL) 50 MG tablet Take one to two tablets at bedtime. 90 tablet 3   No current facility-administered medications for this visit.    Medication Side Effects: None  Allergies:  Allergies  Allergen Reactions   Aripiprazole Other (See Comments)    Parkinsonism      Lactose Intolerance (Gi) Diarrhea   Methotrexate Other (See Comments)    Hair loss, severe stomatitis     Cefdinir Diarrhea    Other reaction(s): Diarrhea Yeast infection and fever; negative c diff   Etanercept Other (See Comments)    Headaches     Exemestane Other (See Comments)    Suicidal thoughts with medication     Fluoxetine Other (See Comments)    Parkinsonism   Lactose     Other reaction(s): diarrhea Other reaction(s): diarrhea Other reaction(s): diarrhea   Methylprednisolone Sodium Succ Other (See Comments)    Agitated mania   Epinephrine Palpitations    tachycardia    Nitrofurantoin Nausea And Vomiting and Rash      Other reaction(s): rash, diarrhea Other reaction(s): rash, diarrhea Other reaction(s): rash, diarrhea Other reaction(s): rash, diarrhea Other reaction(s): rash, diarrhea Other reaction(s): rash, diarrhea    Past Medical History:  Diagnosis Date   Bipolar 1 disorder (HCC)    Breast cancer (HCC)    Chronic diarrhea    loose stools twice a day on average for years   Chronic kidney disease (CKD), stage III (moderate) ([place])    Depression 1987   Family history of breast cancer    Hospitalization or health care facility admission within last 6 months 04/2019   for fall/seizures   Malignant neoplasm of overlapping sites of left breast in female, estrogen receptor positive ([place]) 03/14/2016   Dx in 09/2014, s/p bilateral mastectomies and ALND, 0/10 LN. 1.4 cm  Grade I invasive lobular, ER and PR +/Her--, Ki 67 <5% Tried anastrozole for one month, but developed suicidal idea   Osteoarthritis, knee 09/24/2019   Xray 09/2019   Parkinson's disease ([place]) 2012   Personal  history of malignant neoplasm of breast    Polyposis of colon    Psoriatic arthritis ([place])    PTSD (post-traumatic stress disorder)    Secondary hyperparathyroidism ([place])    Tardive dyskinesia    Thyroid disease     Past Medical History, Surgical history, Social history, and Family history were reviewed and updated as appropriate.   Please see review of systems for further details on the patient's review from today.   Objective:   Physical Exam:  There were no vitals taken for this visit.  Physical Exam Constitutional:      General: She is not in acute distress. Musculoskeletal:        General: No deformity.  Neurological:     Mental Status: She is alert and oriented to person, place, and time.     Coordination: Coordination normal.  Psychiatric:        Attention and Perception: Attention and perception normal. She does not perceive auditory or visual hallucinations.        Mood and Affect: Mood  normal. Mood is not anxious or depressed. Affect is not labile, blunt, angry or inappropriate.        Speech: Speech normal.        Behavior: Behavior normal.        Thought Content: Thought content normal. Thought content is not paranoid or delusional. Thought content does not include homicidal or suicidal ideation. Thought content does not include homicidal or suicidal plan.        Cognition and Memory: Cognition and memory normal.        Judgment: Judgment normal.     Comments: Insight intact     Lab Review:     Component Value Date/Time   NA 142 07/02/2021 1048   NA 138 06/08/2019 0000   K 4.6 07/02/2021 1048   CL 109 07/02/2021 1048   CO2 25 07/02/2021 1048   GLUCOSE 87 07/02/2021 1048   BUN 28 (H) 07/02/2021 1048   BUN 29 (A) 06/08/2019 0000   CREATININE 2.36 (H) 07/02/2021 1048   CREATININE 2.29 (H) 05/22/2021 1044   CALCIUM 9.7 07/02/2021 1048   PROT 7.6 07/02/2021 1048   ALBUMIN 4.2 07/02/2021 1048   AST 13 07/02/2021 1048   ALT 14 07/02/2021 1048   ALKPHOS 125  (H) 07/02/2021 1048   BILITOT 1.2 07/02/2021 1048   GFRNONAA 22 (L) 03/21/2021 1123   GFRAA 25 (L) 09/13/2019 1243       Component Value Date/Time   WBC 12.2 (H) 07/02/2021 1048   RBC 4.54 07/02/2021 1048   HGB 13.6 07/02/2021 1048   HCT 42.0 07/02/2021 1048   PLT 283.0 07/02/2021 1048   MCV 92.5 07/02/2021 1048   MCH 29.1 03/21/2021 1123   MCHC 32.4 07/02/2021 1048   RDW 13.8 07/02/2021 1048   LYMPHSABS 3.2 07/02/2021 1048   MONOABS 0.7 07/02/2021 1048   EOSABS 0.2 07/02/2021 1048   BASOSABS 0.1 07/02/2021 1048    Lithium Lvl  Date Value Ref Range Status  05/22/2021 0.7 0.6 - 1.2 mmol/L Final     No results found for: "PHENYTOIN", "PHENOBARB", "VALPROATE", "CBMZ"   .res Assessment: Plan:    Plan:  Therapist - Christina Hussami - [facility]  Lithium 369m daily  Trazadone 59m- 1 to 2 at bedtime Lorazepam 9m78mID for anxiety - may take one tablet extra for severe anxiety symptoms. Taking one at night routinely. Lamictal 200m12m Lamictal 150mg46mry morning.  Discussed NAC tablets for obsessive thoughts, worry, and rumination - unable to take SSRI's  Next labs July 5  Last Lithium level was 0.7 on 05/22/2021 TSH - .98 06/27/2021 BUN 29 - CR 2.29 3/14  RTC 4 weeks  Counseled patient regarding potential benefits, risks, and side effects of Lamictal to include potential risk of Stevens-Johnson syndrome. Advised patient to stop taking Lamictal and contact office immediately if rash develops and to seek urgent medical attention if rash is severe and/or spreading quickly.  Discussed potential benefits, risk, and side effects of benzodiazepines to include potential risk of tolerance and dependence, as well as possible drowsiness.  Advised patient not to drive if experiencing drowsiness and to take lowest possible effective dose to minimize risk of dependence and tolerance.   Diagnoses and all orders for this visit:  Insomnia, unspecified type -     traZODone  (DESYREL) 50 MG tablet; Take one to two tablets at bedtime.  Bipolar I disorder, most recent episode depressed, moderate (HCC)  Generalized anxiety disorder  PTSD (post-traumatic stress disorder)  Insomnia due  to other mental disorder     Please see After Visit Summary for patient specific instructions.  Future Appointments  Date Time Provider [place]  10/16/2021  1:45 PM Carney Living, PT DWB-REH DWB  10/18/2021 12:20 PM Ellenore Roscoe, Berdie Ogren, NP CP-CP None  11/02/2021 11:00 AM Hussami, Rachel Bo, LCSW BH-OPGSO None  11/07/2021 11:20 AM Donnamae [place]ilenburg, Berdie Ogren, NP CP-CP None  12/10/2021 10:00 AM Inda Coke, PA LBPC-HPC PEC  12/31/2021 11:30 AM Shamleffer, Melanie Crazier, MD LBPC-LBENDO None  04/15/2022  1:45 PM LBPC-HPC HEALTH COACH LBPC-[facility]PC PEC    No orders of the defined types were placed in this encounter.   -------------------------------

## 2021-10-16 ENCOUNTER — Encounter (HOSPITAL_BASED_OUTPATIENT_CLINIC_OR_DEPARTMENT_OTHER): Payer: Self-pay | Admitting: Physical Therapy

## 2021-10-16 ENCOUNTER — Ambulatory Visit: Payer: Medicare Other | Admitting: Psychiatry

## 2021-10-16 ENCOUNTER — Ambulatory Visit (HOSPITAL_BASED_OUTPATIENT_CLINIC_OR_DEPARTMENT_OTHER): Payer: Medicare Other | Attending: Physician Assistant | Admitting: Physical Therapy

## 2021-10-16 DIAGNOSIS — R2689 Other abnormalities of gait and mobility: Secondary | ICD-10-CM | POA: Insufficient documentation

## 2021-10-16 DIAGNOSIS — G8929 Other chronic pain: Secondary | ICD-10-CM | POA: Diagnosis present

## 2021-10-16 DIAGNOSIS — R2681 Unsteadiness on feet: Secondary | ICD-10-CM | POA: Diagnosis present

## 2021-10-16 DIAGNOSIS — M6281 Muscle weakness (generalized): Secondary | ICD-10-CM | POA: Insufficient documentation

## 2021-10-16 DIAGNOSIS — M25561 Pain in right knee: Secondary | ICD-10-CM | POA: Insufficient documentation

## 2021-10-16 NOTE — Therapy (Signed)
OUTPATIENT PHYSICAL THERAPY LOWER EXTREMITY TREATMENT   Patient Name: Tiffany [name] MRN: [id number] DOB:Dec 07, 1950, 71 y.o., female Today's Date: 10/16/2021   PT End of Session - 10/16/21 1350     Visit Number 6    Number of Visits 12    Date for PT Re-Evaluation 11/27/21    PT Start Time 2025    PT Stop Time 1427    PT Time Calculation (min) 42 min    Activity Tolerance Patient tolerated treatment well    Behavior During Therapy WFL for tasks assessed/performed               Past Medical History:  Diagnosis Date   Bipolar 1 disorder ([place])    Breast cancer ([place])    Chronic diarrhea    loose stools twice a day on average for years   Chronic kidney disease (CKD), stage III (moderate) ([place])    Depression 1987   Family history of breast cancer    Hospitalization or health care facility admission within last 6 months 04/2019   for fall/seizures   Malignant neoplasm of overlapping sites of left breast in female, estrogen receptor positive ([place]) 03/14/2016   Dx in 09/2014, s/p bilateral mastectomies and ALND, 0/10 LN. 1.4 cm  Grade I invasive lobular, ER and PR +/Her--, Ki 67 <5% Tried anastrozole for one month, but developed suicidal idea   Osteoarthritis, knee 09/24/2019   Xray 09/2019   Parkinson's disease ([place]) 2012   Personal history of malignant neoplasm of breast    Polyposis of colon    Psoriatic arthritis ([place])    PTSD (post-traumatic stress disorder)    Secondary hyperparathyroidism ([place])    Tardive dyskinesia    Thyroid disease    Past Surgical History:  Procedure Laterality Date   [place] Bilateral 09/19/2014   [place] Bilateral 1974   Patient Active Problem List   Diagnosis Date Noted   Genetic testing 06/14/2021   Family history of breast cancer 06/05/2021   Osteoarthritis, knee 09/24/2019   CKD (chronic kidney  disease) stage 4, GFR 15-29 ml/min ([place]) 09/24/2019   Severe recurrent major depression without psychotic features ([place]) 09/09/2019   Bipolar I disorder, most recent episode depressed ([place])    Transaminitis    Essential hypertension    Encephalopathy 04/26/2019   Leukocytosis 04/21/2019   Thrombocytopenia ([place]) 09/04/2018   Vitamin D deficiency 09/04/2018   Psoriatic arthritis ([place]) 09/04/2018   Tubular adenoma of colon 05/28/2017   History of colonic polyps 05/28/2017   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 09/12/2016   Malignant neoplasm of overlapping sites of left breast in female, estrogen receptor positive ([place]) 03/14/2016   Chronic kidney disease, stage III (moderate) 01/19/2016   Tardive dyskinesia 10/18/2015   History of breast cancer left 2016 12/27/2014   Osteopenia determined by x-ray 10/31/2014   Rosacea 05/21/2007   Bipolar affective disorder, mixed ([place]) 08/16/2004   Acquired hypothyroidism 04/03/1996    PCP: Inda Coke, PA  REFERRING PROVIDER: Inda Coke, PA  REFERRING DIAG:  [phone] - recurrent pain of right knee  THERAPY DIAG:  Other abnormalities of gait and mobility  Unsteadiness on feet  Muscle weakness (generalized)  Chronic pain of right knee  Rationale for Evaluation and Treatment Rehabilitation  ONSET DATE: 3 weeks ago  SUBJECTIVE:   SUBJECTIVE STATEMENT: The patient reports her  knee is much better. She reports decreased pain after the last visit. She is motivated to get back into the gym.   PERTINENT HISTORY: Bi-polar, CKD, depression, breast cancer, OA, Parkinson's, Psoriatic Arthritis, PTSD, tardive diskenesia,   PAIN:  Are you having pain? Yes: NPRS scale: 6/10 Pain location: right lateral knee Pain description: aching Aggravating factors: standing, walking , twisting Relieving factors: rest  PRECAUTIONS: None  WEIGHT BEARING RESTRICTIONS No  FALLS:  Has patient fallen in last 6 months?  No  LIVING ENVIRONMENT: Lives with husband  OCCUPATION: retired   PLOF: Independent uses an assitvie device at times  PATIENT GOALS   To have less pain in her knee and to get back into the gym   OBJECTIVE:   DIAGNOSTIC FINDINGS:   PATIENT SURVEYS:   COGNITION:  Overall cognitive status: Within functional limits for tasks assessed     SENSATION: WFL   POSTURE: rounded shoulders and forward head  PALPATION: Trigger points in lower IT band   LOWER EXTREMITY ROM:  Active PROM Right eval Left eval  Hip flexion    Hip extension    Hip abduction    Hip adduction    Hip internal rotation    Hip external rotation    Knee flexion 94   Knee extension -13   Ankle dorsiflexion    Ankle plantarflexion    Ankle inversion    Ankle eversion     (Blank rows = not tested)  LOWER EXTREMITY MMT:  MMT Right eval Left eval  Hip flexion 16.1 23.1  Hip extension    Hip abduction    Hip adduction 29.7 27.3  Hip internal rotation    Hip external rotation    Knee flexion    Knee extension 25.6 33  Ankle dorsiflexion    Ankle plantarflexion    Ankle inversion    Ankle eversion     (Blank rows = not tested)   GAIT: Decreased single leg stance time on the right    TODAY'S TREATMENT: 8/8 Manual: PROM into extension; trigger point release to posterior knee  SAQ x20  SLR 2x10  Row machine 2x15  15 lbs  Chest press 2x10  Leg press 2x15 25 lbs  Reviewed the use of a few other machines can use.   7/24 Nu-step warm up 5 min L2  Manual: Knee extension stretch Lat pull down from cable 2x10 10lbs  Tricpes extension 2x10 5 lbs min cuing for technique  Reviewed 2 different lat pull down machines. She did better with the lifestyles machine. The seat on the cybex is too low. 2x10 10lbs each  Quad set 2x10  SLR 2x6      7/19 Nu-step warm up 5 min L2  Manual: Knee extension stretch  Leg press 2x15 35 lbs  Hip abd 2x15 25lb Cable row     PATIENT EDUCATION:   Education details: reviewed HEP and gym equipment.  Person educated: Patient Education method: Explanation, Demonstration, Tactile cues, Verbal cues, and Handouts Education comprehension: verbalized understanding, returned demonstration, verbal cues required, tactile cues required, and needs further education   HOME EXERCISE PROGRAM: Has past HEP   ASSESSMENT:  CLINICAL IMPRESSION: The patient was encouraged to get into the gym and start working on her program. Her knee extension was the best that it has been in a while. She worked on more gym exercises. We were able to add the chest press and the row machine. We will leave her case open in case she has  any difficulty getting back into her program.   OBJECTIVE IMPAIRMENTS Abnormal gait, decreased activity tolerance, decreased endurance, decreased mobility, difficulty walking, decreased ROM, decreased strength, increased fascial restrictions, and pain.   ACTIVITY LIMITATIONS carrying, bending, standing, squatting, stairs, transfers, and locomotion level  PARTICIPATION LIMITATIONS: cleaning, laundry, shopping, community activity, and yard work  PERSONAL FACTORS 3+ comorbidities: Psoriatic arthritis, Bi-polar parkinsosn  are also affecting patient's functional outcome.   REHAB POTENTIAL: Good  CLINICAL DECISION MAKING: Evolving/moderate complexity  EVALUATION COMPLEXITY: Moderate   GOALS: Goals reviewed with patient? Yes  SHORT TERM GOALS: Target date: 09/09/2021 reviewed on 6/28  Patient will increase gross right LE strength by 5 lbs  Baseline: Goal status: ]not tested today   2.  Patient will be increase knee extension passive by 5 degrees  Baseline:  Goal status: Achieved improved to 08 degrees today   3.  Patient will be independent with basic HEP  Baseline:  Goal status: independent with basic HEP. Advancing back to gym program   LONG TERM GOALS: Target date: 10/11/2021   Patient will stand for 20 min without pain in  order to perform her ADL's  Baseline:  Goal status: INITIAL  2.  Patient will be independent with full gym program  Baseline:  Goal status: INITIAL  3.  Patient will ambulate without increased pain  Baseline:  Goal status: INITIAL    PLAN: PT FREQUENCY: 2x/week  PT DURATION: 8 weeks  PLANNED INTERVENTIONS: Therapeutic exercises, Therapeutic activity, Neuromuscular re-education, Balance training, Gait training, Patient/Family education, Joint mobilization, Stair training, DME instructions, Aquatic Therapy, Dry Needling, Electrical stimulation, Cryotherapy, Moist heat, Ultrasound, and Manual therapy  PLAN FOR NEXT SESSION: Continue manual as indicated, progress to resistive standing exercise, balance/SLS as tolerated   Carney Living, PT 10/16/2021, 1:52 PM

## 2021-10-17 ENCOUNTER — Telehealth: Payer: Self-pay | Admitting: Adult Health

## 2021-10-17 ENCOUNTER — Encounter (HOSPITAL_BASED_OUTPATIENT_CLINIC_OR_DEPARTMENT_OTHER): Payer: Self-pay | Admitting: Physical Therapy

## 2021-10-17 LAB — BASIC METABOLIC PANEL
BUN: 30 — AB (ref 4–21)
CO2: 24 — AB (ref 13–22)
Chloride: 109 — AB (ref 99–108)
Creatinine: 2.5 — AB (ref 0.5–1.1)
Glucose: 91
Potassium: 5 mEq/L (ref 3.5–5.1)
Sodium: 142 (ref 137–147)

## 2021-10-17 LAB — COMPREHENSIVE METABOLIC PANEL
Albumin: 4.8 (ref 3.5–5.0)
Calcium: 9.7 (ref 8.7–10.7)
eGFR: 20

## 2021-10-17 NOTE — Telephone Encounter (Signed)
This is another provider wanting to speak to you about her

## 2021-10-17 NOTE — Telephone Encounter (Signed)
Called and spoke with provider.

## 2021-10-17 NOTE — Telephone Encounter (Signed)
Jannifer Hick Lvm @ 12:30p.  She said she saw pt today and was wanting to talk to [place] before Barnett Applebaum meets with the pt on tomorrow.  Cell phone [phone]

## 2021-10-18 ENCOUNTER — Ambulatory Visit (INDEPENDENT_AMBULATORY_CARE_PROVIDER_SITE_OTHER): Payer: Medicare Other | Admitting: Adult Health

## 2021-10-18 ENCOUNTER — Encounter: Payer: Self-pay | Admitting: Adult Health

## 2021-10-18 DIAGNOSIS — F4310 Post-traumatic stress disorder, unspecified: Secondary | ICD-10-CM | POA: Diagnosis not present

## 2021-10-18 DIAGNOSIS — G4700 Insomnia, unspecified: Secondary | ICD-10-CM

## 2021-10-18 DIAGNOSIS — F411 Generalized anxiety disorder: Secondary | ICD-10-CM | POA: Diagnosis not present

## 2021-10-18 DIAGNOSIS — F5105 Insomnia due to other mental disorder: Secondary | ICD-10-CM

## 2021-10-18 DIAGNOSIS — F99 Mental disorder, not otherwise specified: Secondary | ICD-10-CM

## 2021-10-18 DIAGNOSIS — F3132 Bipolar disorder, current episode depressed, moderate: Secondary | ICD-10-CM | POA: Diagnosis not present

## 2021-10-18 NOTE — Progress Notes (Signed)
Tiffany [name] [id number] Aug 04, 1950 71 y.o.  Subjective:   Patient ID:  Tiffany [name] is a 71 y.o. (DOB 07-Dec-1950) female.  Chief Complaint: No chief complaint on file.   HPI Tiffany [name] presents to the office today for follow-up of PTSD, insomnia, GAD, BPD 1.  Accompanied by husband.  Describes mood today as "ok". Pleasant. Tearful at times. Mood symptoms - reports decreased depression, anxiety, and irritability. Reports some worry and rumination. Reports some obsessive thoughts - irrational thoughts.  Reporting flashbacks - "if I'm triggered". Reports mood improvement over the past 3 days with increase in Lamictal. Denies cycling over the past 3 days - feeling more "even". Stating "I feel ok right now". Continues to look for a trauma therapist. Varying interest and motivation. Taking medications as prescribed. Energy levels vary. Active, does not have a regular exercise routine - knee pain - working with P/T.   Enjoys some usual interests and activities. Married. Lives with husband their daughter. Talking to family and friends.  Appetite improved. Weight stabilized.    Sleeps well most nights. Averages 7 to 8 hours of broken sleep - up and down to the bathroom. Focus and concentration difficulties. Completing tasks. Managing some aspects of household. Retired.  Denies SI.  Denies HI.  Denies AH Denies VH  Denies self harm. Denies substance use.   Previous medications: Celexa, Zyprexa, Tegretol, Depakote, Serzone, Topamax, Seroquel, Effexor, Lexapro, Desipramine, Neurontin, Abilify, Geodon, Propanolol, Cymbalta, Cogentin, Trihexyphenadyl, Sinmmet, Provigil, Selegiline, Requip, Amantadine, Prozac, Mirapex, Azilect, Metoclopramide, Baclofen, Artane, Namenda, Latuda.     [place] Office Visit from 02/16/2021 in [facility] Visit from 05/14/2019 in [facility]  Total GAD-7 Score 13 [place] Office Visit from 06/07/2019 in [facility]  Total Score (max 30 points ) 29      PHQ2-9    [place] Visit from 07/30/2021 in [facility] Visit from 07/02/2021 in [facility] from 04/02/2021 in [facility] Visit from 02/16/2021 in [facility] from 07/26/2020 in [facility]  PHQ-2 Total Score 2 1 1 3 1   PHQ-9 Total Score 11 6 1 6  --      Flowsheet Row Clinical Support from 04/02/2021 in [facility] from 03/21/2021 in [facility] ED from 02/03/2021 in [facility] No Risk No Risk No Risk        Review of Systems:  Review of Systems  Medications: I have reviewed the patient's current medications.  Current Outpatient Medications  Medication Sig Dispense Refill   Azelaic Acid 15 % gel      Certolizumab Pegol (CIMZIA) 2 X 200 MG KIT See admin instructions.     cholecalciferol (VITAMIN D3) 25 MCG (1000 UNIT) tablet Take 1,000 Units by mouth daily.     lamoTRIgine (LAMICTAL) 100 MG tablet Take one tablet in the morning and take one tablet at lunch. 180 tablet 1   lamoTRIgine (LAMICTAL) 200 MG tablet Take 1 tablet (200 mg total) by mouth at bedtime. 90 tablet 3   levothyroxine (SYNTHROID) 112 MCG tablet TAKE 1 TABLET BY MOUTH EVERY DAY BEFORE BREAKFAST 90 tablet 0   lithium carbonate 300 MG capsule TAKE 1 CAPSULE(300 MG) BY MOUTH AT BEDTIME 90 capsule 3   LORazepam (  ATIVAN) 1 MG tablet TAKE 1 TABLET(1 MG) BY MOUTH TWICE DAILY AS NEEDED 60 tablet 2   metroNIDAZOLE (METROGEL) 0.75 % gel Apply to face 1-2 times daily 45 g 0   sodium bicarbonate 650 MG tablet Take 650 mg by mouth daily.     traZODone (DESYREL) 50 MG tablet Take one to two tablets at bedtime. 90 tablet 3   No current  facility-administered medications for this visit.    Medication Side Effects: None  Allergies:  Allergies  Allergen Reactions   Aripiprazole Other (See Comments)    Parkinsonism      Lactose Intolerance (Gi) Diarrhea   Methotrexate Other (See Comments)    Hair loss, severe stomatitis     Cefdinir Diarrhea    Other reaction(s): Diarrhea Yeast infection and fever; negative c diff   Etanercept Other (See Comments)    Headaches     Exemestane Other (See Comments)    Suicidal thoughts with medication     Fluoxetine Other (See Comments)    Parkinsonism   Lactose     Other reaction(s): diarrhea Other reaction(s): diarrhea Other reaction(s): diarrhea   Methylprednisolone Sodium Succ Other (See Comments)    Agitated mania   Epinephrine Palpitations    tachycardia    Nitrofurantoin Nausea And Vomiting and Rash      Other reaction(s): rash, diarrhea Other reaction(s): rash, diarrhea Other reaction(s): rash, diarrhea Other reaction(s): rash, diarrhea Other reaction(s): rash, diarrhea Other reaction(s): rash, diarrhea    Past Medical History:  Diagnosis Date   Bipolar 1 disorder ([facility])    Breast cancer ([facility])    Chronic diarrhea    loose stools twice a day on average for years   Chronic kidney disease (CKD), stage III (moderate) ([place])    Depression 1987   Family history of breast cancer    Hospitalization or health care facility admission within last 6 months 04/2019   for fall/seizures   Malignant neoplasm of overlapping sites of left breast in female, estrogen receptor positive ([place]) 03/14/2016   Dx in 09/2014, s/p bilateral mastectomies and ALND, 0/10 LN. 1.4 cm  Grade I invasive lobular, ER and PR +/Her--, Ki 67 <5% Tried anastrozole for one month, but developed suicidal idea   Osteoarthritis, knee 09/24/2019   Xray 09/2019   Parkinson's disease ([place]) 2012   Personal history of malignant neoplasm of breast    Polyposis of colon    Psoriatic arthritis ([place])     PTSD (post-traumatic stress disorder)    Secondary hyperparathyroidism ([place])    Tardive dyskinesia    Thyroid disease     Past Medical History, Surgical history, Social history, and Family history were reviewed and updated as appropriate.   Please see review of systems for further details on the patient's review from today.   Objective:   Physical Exam:  There were no vitals taken for this visit.  Physical Exam Constitutional:      General: She is not in acute distress. Musculoskeletal:        General: No deformity.  Neurological:     Mental Status: She is alert and oriented to person, place, and time.     Coordination: Coordination normal.  Psychiatric:        Attention and Perception: Attention and perception normal. She does not perceive auditory or visual hallucinations.        Mood and Affect: Mood normal. Mood is not anxious or depressed. Affect is not labile, blunt, angry or inappropriate.  Speech: Speech normal.        Behavior: Behavior normal.        Thought Content: Thought content normal. Thought content is not paranoid or delusional. Thought content does not include homicidal or suicidal ideation. Thought content does not include homicidal or suicidal plan.        Cognition and Memory: Cognition and memory normal.        Judgment: Judgment normal.     Comments: Insight intact     Lab Review:     Component Value Date/Time   NA 142 07/02/2021 1048   NA 138 06/08/2019 0000   K 4.6 07/02/2021 1048   CL 109 07/02/2021 1048   CO2 25 07/02/2021 1048   GLUCOSE 87 07/02/2021 1048   BUN 28 (H) 07/02/2021 1048   BUN 29 (A) 06/08/2019 0000   CREATININE 2.36 (H) 07/02/2021 1048   CREATININE 2.29 (H) 05/22/2021 1044   CALCIUM 9.7 07/02/2021 1048   PROT 7.6 07/02/2021 1048   ALBUMIN 4.2 07/02/2021 1048   AST 13 07/02/2021 1048   ALT 14 07/02/2021 1048   ALKPHOS 125 (H) 07/02/2021 1048   BILITOT 1.2 07/02/2021 1048   GFRNONAA 22 (L) 03/21/2021 1123   GFRAA  25 (L) 09/13/2019 1243       Component Value Date/Time   WBC 12.2 (H) 07/02/2021 1048   RBC 4.54 07/02/2021 1048   HGB 13.6 07/02/2021 1048   HCT 42.0 07/02/2021 1048   PLT 283.0 07/02/2021 1048   MCV 92.5 07/02/2021 1048   MCH 29.1 03/21/2021 1123   MCHC 32.4 07/02/2021 1048   RDW 13.8 07/02/2021 1048   LYMPHSABS 3.2 07/02/2021 1048   MONOABS 0.7 07/02/2021 1048   EOSABS 0.2 07/02/2021 1048   BASOSABS 0.1 07/02/2021 1048    Lithium Lvl  Date Value Ref Range Status  05/22/2021 0.7 0.6 - 1.2 mmol/L Final     No results found for: "PHENYTOIN", "PHENOBARB", "VALPROATE", "CBMZ"   .res Assessment: Plan:    Plan:  Therapist - Christina Hussami - [facility]  Lithium 375m daily  Trazadone 552m- 1 to 2 at bedtime Lorazepam 15m78mID for anxiety - may take one tablet extra for severe anxiety symptoms. Taking one at night routinely. Lamictal 200m38m Lamictal 150mg87mry morning.  Discussed NAC tablets for obsessive thoughts, worry, and rumination - unable to take SSRI's  RTC 4 weeks  Counseled patient regarding potential benefits, risks, and side effects of Lamictal to include potential risk of Stevens-Johnson syndrome. Advised patient to stop taking Lamictal and contact office immediately if rash develops and to seek urgent medical attention if rash is severe and/or spreading quickly.  Discussed potential benefits, risk, and side effects of benzodiazepines to include potential risk of tolerance and dependence, as well as possible drowsiness.  Advised patient not to drive if experiencing drowsiness and to take lowest possible effective dose to minimize risk of dependence and tolerance.   Diagnoses and all orders for this visit:  Insomnia, unspecified type  Bipolar I disorder, most recent episode depressed, moderate (HCC)  Generalized anxiety disorder  PTSD (post-traumatic stress disorder)  Insomnia due to other mental disorder     Please see After Visit Summary for  patient specific instructions.  Future Appointments  Date Time Provider DeparNondalton7/2023 10:40 AM Kendarius Vigen, ReginBerdie OgrenCP-CP None  11/02/2021 11:00 AM Hussami, ChrisRachel BoW [facility] None  11/07/2021 11:20 AM Abdul Beirne, ReginBerdie OgrenCP-CP None  12/10/2021 10:00 AM WorleInda Coke  PA [facility]  12/31/2021 11:30 AM Shamleffer, Melanie Crazier, MD [facility] None  04/15/2022  1:45 PM [facility] HEALTH COACH [facility]    No orders of the defined types were placed in this encounter.   -------------------------------

## 2021-10-23 ENCOUNTER — Ambulatory Visit: Payer: Medicare Other | Admitting: Psychiatry

## 2021-10-25 ENCOUNTER — Encounter: Payer: Self-pay | Admitting: Adult Health

## 2021-10-25 ENCOUNTER — Ambulatory Visit (INDEPENDENT_AMBULATORY_CARE_PROVIDER_SITE_OTHER): Payer: Medicare Other | Admitting: Adult Health

## 2021-10-25 DIAGNOSIS — G4700 Insomnia, unspecified: Secondary | ICD-10-CM | POA: Diagnosis not present

## 2021-10-25 DIAGNOSIS — F3132 Bipolar disorder, current episode depressed, moderate: Secondary | ICD-10-CM | POA: Diagnosis not present

## 2021-10-25 DIAGNOSIS — F4310 Post-traumatic stress disorder, unspecified: Secondary | ICD-10-CM | POA: Diagnosis not present

## 2021-10-25 DIAGNOSIS — F411 Generalized anxiety disorder: Secondary | ICD-10-CM

## 2021-10-25 MED ORDER — LURASIDONE HCL 20 MG PO TABS: 20.0000 mg | ORAL_TABLET | Freq: Every day | ORAL | 2 refills | Status: DC

## 2021-10-25 NOTE — Progress Notes (Signed)
Tiffany [name] [id number] 1951/01/04 71 y.o.  Subjective:   Patient ID:  Tiffany [name] is a 71 y.o. (DOB 1950-12-07) female.  Chief Complaint: No chief complaint on file.   HPI Tiffany [name] presents to the office today for follow-up of PTSD, insomnia, GAD, BPD 1.  Accompanied by husband.  Describes mood today as "ok". Pleasant. Tearful at times. Mood symptoms - reports depression, anxiety, and irritability. Reports worry and rumination. Reports some obsessive thoughts - irrational thoughts. Reporting flashbacks - "not as much". Reports mood has declined over the past week. Reports mood is cycling again. Stating "I am not doing as well as I was". Willing to consider other options. Was able to call and talk to sister yesterday. Continues to look for a trauma therapist. Varying interest and motivation. Taking medications as prescribed. Energy levels vary. Active, does not have a regular exercise routine - knee pain - working with P/T.   Enjoys some usual interests and activities. Married. Lives with husband their daughter. Talking to family and friends.  Appetite improved. Weight gain 5 to 6 pounds over the past several weeks.   Sleeping difficulties. Averages 7 to 8 hours of broken sleep - up and down during the night going to the bathroom. Focus and concentration difficulties. Completing tasks. Managing some aspects of household. Retired.  Denies SI.  Denies HI.  Denies AH Denies VH  Denies self harm. Denies substance use.   Previous medications: Celexa, Zyprexa, Tegretol, Depakote, Serzone, Topamax, Seroquel, Effexor, Lexapro, Desipramine, Neurontin, Abilify, Geodon, Propanolol, Cymbalta, Cogentin, Trihexyphenadyl, Sinmmet, Provigil, Selegiline, Requip, Amantadine, Prozac, Mirapex, Azilect, Metoclopramide, Baclofen, Artane, Namenda, Latuda.    [place] Office Visit from 02/16/2021 in [facility] Visit from 05/14/2019 in [facility]  Total GAD-7 Score 13 [facility] Office Visit from 06/07/2019 in [facility]  Total Score (max 30 points ) 29      PHQ2-9    [place] Visit from 07/30/2021 in [facility] Visit from 07/02/2021 in [facility] from 04/02/2021 in [facility] Visit from 02/16/2021 in [facility] from 07/26/2020 in [facility]  PHQ-2 Total Score _0 PHQ-9 Total Score _1 --      Flowsheet Row Clinical Support from 04/02/2021 in [facility] from 03/21/2021 in [facility] ED from 02/03/2021 in [facility] No Risk No Risk No Risk        Review of Systems:  Review of Systems  Musculoskeletal:  Negative for gait problem.  Neurological:  Negative for tremors.  Psychiatric/Behavioral:         Please refer to HPI    Medications: I have reviewed the patient's current medications.  Current Outpatient Medications  Medication Sig Dispense Refill   lurasidone (LATUDA) 20 MG TABS tablet Take 1 tablet (20 mg total) by mouth daily with supper. 30 tablet 2   aMILoride (MIDAMOR) 5 MG tablet Take 5 mg by mouth every morning.     augmented betamethasone dipropionate (DIPROLENE-AF) 0.05 % cream Apply topically.     Azelaic Acid 15 % gel      Certolizumab Pegol (CIMZIA) 2 X 200 MG KIT See admin instructions.     cholecalciferol (VITAMIN D3) 25  MCG (1000 UNIT) tablet Take 1,000 Units by mouth daily.     lamoTRIgine (LAMICTAL) 100 MG tablet Take one tablet in the morning and take one tablet at lunch. 180 tablet 1   lamoTRIgine (LAMICTAL) 200 MG tablet Take 1 tablet (200 mg total) by mouth at bedtime. 90 tablet 3   levothyroxine (SYNTHROID) 112 MCG tablet TAKE 1 TABLET BY MOUTH EVERY DAY BEFORE  BREAKFAST 90 tablet 0   lithium carbonate 300 MG capsule TAKE 1 CAPSULE(300 MG) BY MOUTH AT BEDTIME 90 capsule 3   LORazepam (ATIVAN) 1 MG tablet TAKE 1 TABLET(1 MG) BY MOUTH TWICE DAILY AS NEEDED 60 tablet 2   metroNIDAZOLE (METROGEL) 0.75 % gel Apply to face 1-2 times daily 45 g 0   sodium bicarbonate 650 MG tablet Take 650 mg by mouth daily.     traZODone (DESYREL) 50 MG tablet Take one to two tablets at bedtime. 90 tablet 3   No current facility-administered medications for this visit.    Medication Side Effects: None  Allergies:  Allergies  Allergen Reactions   Aripiprazole Other (See Comments)    Parkinsonism      Lactose Intolerance (Gi) Diarrhea   Methotrexate Other (See Comments)    Hair loss, severe stomatitis     Cefdinir Diarrhea    Other reaction(s): Diarrhea Yeast infection and fever; negative c diff   Etanercept Other (See Comments)    Headaches     Exemestane Other (See Comments)    Suicidal thoughts with medication     Fluoxetine Other (See Comments)    Parkinsonism   Lactose     Other reaction(s): diarrhea Other reaction(s): diarrhea Other reaction(s): diarrhea   Methylprednisolone Sodium Succ Other (See Comments)    Agitated mania   Epinephrine Palpitations    tachycardia    Nitrofurantoin Nausea And Vomiting and Rash      Other reaction(s): rash, diarrhea Other reaction(s): rash, diarrhea Other reaction(s): rash, diarrhea Other reaction(s): rash, diarrhea Other reaction(s): rash, diarrhea Other reaction(s): rash, diarrhea    Past Medical History:  Diagnosis Date   Bipolar 1 disorder (HCC)    Breast cancer (HCC)    Chronic diarrhea    loose stools twice a day on average for years   Chronic kidney disease (CKD), stage III (moderate) ([place])    Depression 1987   Family history of breast cancer    Hospitalization or health care facility admission within last 6 months 04/2019   for fall/seizures   Malignant neoplasm of overlapping sites  of left breast in female, estrogen receptor positive ([place]) 03/14/2016   Dx in 09/2014, s/p bilateral mastectomies and ALND, 0/10 LN. 1.4 cm  Grade I invasive lobular, ER and PR +/Her--, Ki 67 <5% Tried anastrozole for one month, but developed suicidal idea   Osteoarthritis, knee 09/24/2019   Xray 09/2019   Parkinson's disease ([place]) 2012   Personal history of malignant neoplasm of breast    Polyposis of colon    Psoriatic arthritis ([place])    PTSD (post-traumatic stress disorder)    Secondary hyperparathyroidism ([place])    Tardive dyskinesia    Thyroid disease     Past Medical History, Surgical history, Social history, and Family history were reviewed and updated as appropriate.   Please see review of systems for further details on the patient's review from today.   Objective:   Physical Exam:  There were no vitals taken for this visit.  Physical Exam Constitutional:      General: She is  not in acute distress. Musculoskeletal:        General: No deformity.  Neurological:     Mental Status: She is alert and oriented to person, place, and time.     Coordination: Coordination normal.  Psychiatric:        Attention and Perception: Attention and perception normal. She does not perceive auditory or visual hallucinations.        Mood and Affect: Mood normal. Mood is not anxious or depressed. Affect is not labile, blunt, angry or inappropriate.        Speech: Speech normal.        Behavior: Behavior normal.        Thought Content: Thought content normal. Thought content is not paranoid or delusional. Thought content does not include homicidal or suicidal ideation. Thought content does not include homicidal or suicidal plan.        Cognition and Memory: Cognition and memory normal.        Judgment: Judgment normal.     Comments: Insight intact     Lab Review:     Component Value Date/Time   NA 142 07/02/2021 1048   NA 138 06/08/2019 0000   K 4.6 07/02/2021 1048   CL 109 07/02/2021  1048   CO2 25 07/02/2021 1048   GLUCOSE 87 07/02/2021 1048   BUN 28 (H) 07/02/2021 1048   BUN 29 (A) 06/08/2019 0000   CREATININE 2.36 (H) 07/02/2021 1048   CREATININE 2.29 (H) 05/22/2021 1044   CALCIUM 9.7 07/02/2021 1048   PROT 7.6 07/02/2021 1048   ALBUMIN 4.2 07/02/2021 1048   AST 13 07/02/2021 1048   ALT 14 07/02/2021 1048   ALKPHOS 125 (H) 07/02/2021 1048   BILITOT 1.2 07/02/2021 1048   GFRNONAA 22 (L) 03/21/2021 1123   GFRAA 25 (L) 09/13/2019 1243       Component Value Date/Time   WBC 12.2 (H) 07/02/2021 1048   RBC 4.54 07/02/2021 1048   HGB 13.6 07/02/2021 1048   HCT 42.0 07/02/2021 1048   PLT 283.0 07/02/2021 1048   MCV 92.5 07/02/2021 1048   MCH 29.1 03/21/2021 1123   MCHC 32.4 07/02/2021 1048   RDW 13.8 07/02/2021 1048   LYMPHSABS 3.2 07/02/2021 1048   MONOABS 0.7 07/02/2021 1048   EOSABS 0.2 07/02/2021 1048   BASOSABS 0.1 07/02/2021 1048    Lithium Lvl  Date Value Ref Range Status  05/22/2021 0.7 0.6 - 1.2 mmol/L Final     No results found for: "PHENYTOIN", "PHENOBARB", "VALPROATE", "CBMZ"   .res Assessment: Plan:    Plan:  Therapist - Christina Hussami - [facility]  Lithium 352m daily  Trazadone 547m- 1 to 2 at bedtime Lorazepam 14m95mID for anxiety - may take one tablet extra for severe anxiety symptoms. Taking one at night routinely. Lamictal 200m70m Lamictal 150mg33mry morning.  Discussed NAC tablets for obsessive thoughts, worry, and rumination - unable to take SSRI's  RTC 4 weeks  Counseled patient regarding potential benefits, risks, and side effects of Lamictal to include potential risk of Stevens-Johnson syndrome. Advised patient to stop taking Lamictal and contact office immediately if rash develops and to seek urgent medical attention if rash is severe and/or spreading quickly.  Discussed potential benefits, risk, and side effects of benzodiazepines to include potential risk of tolerance and dependence, as well as possible  drowsiness.  Advised patient not to drive if experiencing drowsiness and to take lowest possible effective dose to minimize risk of dependence and tolerance.  Diagnoses and all orders for this visit:  Bipolar I disorder, most recent episode depressed, moderate (HCC) -     lurasidone (LATUDA) 20 MG TABS tablet; Take 1 tablet (20 mg total) by mouth daily with supper.  Insomnia, unspecified type  Generalized anxiety disorder  PTSD (post-traumatic stress disorder)     Please see After Visit Summary for patient specific instructions.  Future Appointments  Date Time Provider [place]  11/01/2021  9:00 AM Cathleen Yagi, Berdie Ogren, NP [facility] None  11/02/2021 11:00 AM Hussami, Rachel Bo, LCSW [facility] None  11/07/2021 11:20 AM Breeley Bischof, Berdie Ogren, NP [facility] None  12/10/2021 10:00 AM Inda Coke, PA [facility]  12/31/2021 11:30 AM Shamleffer, Melanie Crazier, MD [facility] None  04/15/2022  1:45 PM [facility] HEALTH COACH [facility]    No orders of the defined types were placed in this encounter.   -------------------------------

## 2021-10-29 ENCOUNTER — Emergency Department (HOSPITAL_BASED_OUTPATIENT_CLINIC_OR_DEPARTMENT_OTHER)
Admission: EM | Admit: 2021-10-29 | Discharge: 2021-10-29 | Disposition: A | Discharge status: Home / Self Care | Payer: Medicare Other | Attending: Emergency Medicine | Admitting: Emergency Medicine

## 2021-10-29 ENCOUNTER — Other Ambulatory Visit: Payer: Self-pay

## 2021-10-29 ENCOUNTER — Emergency Department (HOSPITAL_BASED_OUTPATIENT_CLINIC_OR_DEPARTMENT_OTHER): Payer: Medicare Other | Admitting: Radiology

## 2021-10-29 ENCOUNTER — Telehealth: Payer: Self-pay | Admitting: Physician Assistant

## 2021-10-29 DIAGNOSIS — Z79899 Other long term (current) drug therapy: Secondary | ICD-10-CM | POA: Insufficient documentation

## 2021-10-29 DIAGNOSIS — G20 Parkinson's disease: Secondary | ICD-10-CM | POA: Insufficient documentation

## 2021-10-29 DIAGNOSIS — R5383 Other fatigue: Secondary | ICD-10-CM | POA: Diagnosis present

## 2021-10-29 DIAGNOSIS — R0602 Shortness of breath: Secondary | ICD-10-CM | POA: Insufficient documentation

## 2021-10-29 DIAGNOSIS — N184 Chronic kidney disease, stage 4 (severe): Secondary | ICD-10-CM | POA: Diagnosis not present

## 2021-10-29 DIAGNOSIS — Z20822 Contact with and (suspected) exposure to covid-19: Secondary | ICD-10-CM | POA: Insufficient documentation

## 2021-10-29 DIAGNOSIS — R002 Palpitations: Secondary | ICD-10-CM | POA: Diagnosis not present

## 2021-10-29 LAB — URINALYSIS, ROUTINE W REFLEX MICROSCOPIC
Bilirubin Urine: NEGATIVE
Glucose, UA: NEGATIVE mg/dL
Hgb urine dipstick: NEGATIVE
Ketones, ur: NEGATIVE mg/dL
Leukocytes,Ua: NEGATIVE
Nitrite: NEGATIVE
Protein, ur: 30 mg/dL — AB
Specific Gravity, Urine: 1.014 (ref 1.005–1.030)
pH: 6 (ref 5.0–8.0)

## 2021-10-29 LAB — CBC
HCT: 44.1 % (ref 36.0–46.0)
Hemoglobin: 14.1 g/dL (ref 12.0–15.0)
MCH: 30.4 pg (ref 26.0–34.0)
MCHC: 32 g/dL (ref 30.0–36.0)
MCV: 95 fL (ref 80.0–100.0)
Platelets: 214 10*3/uL (ref 150–400)
RBC: 4.64 MIL/uL (ref 3.87–5.11)
RDW: 13.2 % (ref 11.5–15.5)
WBC: 9.5 10*3/uL (ref 4.0–10.5)
nRBC: 0 % (ref 0.0–0.2)

## 2021-10-29 LAB — BASIC METABOLIC PANEL
Anion gap: 10 (ref 5–15)
BUN: 35 mg/dL — ABNORMAL HIGH (ref 8–23)
CO2: 17 mmol/L — ABNORMAL LOW (ref 22–32)
Calcium: 9.7 mg/dL (ref 8.9–10.3)
Chloride: 110 mmol/L (ref 98–111)
Creatinine, Ser: 2.47 mg/dL — ABNORMAL HIGH (ref 0.44–1.00)
GFR, Estimated: 20 mL/min — ABNORMAL LOW (ref >60)
Glucose, Bld: 91 mg/dL (ref 70–99)
Potassium: 5.1 mmol/L (ref 3.5–5.1)
Sodium: 137 mmol/L (ref 135–145)

## 2021-10-29 LAB — SARS CORONAVIRUS 2 BY RT PCR: SARS Coronavirus 2 by RT PCR: NEGATIVE

## 2021-10-29 MED ORDER — ALBUTEROL SULFATE HFA 108 (90 BASE) MCG/ACT IN AERS: 2.0000 | INHALATION_SPRAY | RESPIRATORY_TRACT | Status: DC | PRN

## 2021-10-29 NOTE — Telephone Encounter (Addendum)
Patient Name: Tiffany [name] Gender: Female DOB: 1951-01-21 Age: 71 Y 69 M 5 D Return Phone Number: [phone] (Primary), [phone] (Secondary) Address: City/ State/ Zip: [place]  [address] Client [facility] Client Site [facility] Day Provider Morene Rankins, [place]- [place] Contact Type Call Who Is Calling Patient / Member / Family / Caregiver Call Type Triage / Clinical Relationship To Patient Self Return Phone Number [phone] (Primary) Chief Complaint BREATHING - shortness of breath or sounds breathless Reason for Call Symptomatic / Request for [name] states she has severe shortness of breath and fatigued. Translation No Nurse Assessment Nurse: Fredderick Phenix, RN, Lelan Pons Date/Time Eilene Ghazi Time): 10/29/2021 9:17:09 AM Confirm and document reason for call. If symptomatic, describe symptoms. ---Caller states she has severe shortness of breath and fatigue. Shortness of breath occurs more when lying down than standing up, also notices heart beating more when lying down. Started about a week ago. Does the patient have any new or worsening symptoms? ---Yes Will a triage be completed? ---Yes Related visit to physician within the last 2 weeks? ---No Does the PT have any chronic conditions? (i.e. diabetes, asthma, this includes High risk factors for pregnancy, etc.) ---Yes List chronic conditions. ---CKD, RA Is this a behavioral health or substance abuse call? ---No Guidelines Guideline Title Affirmed Question Affirmed Notes Nurse Date/Time (Eastern Time) Breathing Difficulty [1] MODERATE difficulty breathing (e.g., speaks in phrases, SOB even at rest, pulse 100-120) AND [2] NEW-onset Stormy Card 10/29/2021 9:19:40 AM  Guidelines Guideline Title Affirmed Question Affirmed Notes Nurse Date/Time ([place] Time) or WORSE than normal Disp. Time Eilene Ghazi Time)  Disposition Final User 10/29/2021 9:14:45 AM Send to Urgent Queue Candise Bowens 10/29/2021 9:24:01 AM Go to ED Now Yes Fredderick Phenix, RN, Lelan Pons Final Disposition 10/29/2021 9:24:01 AM Go to ED Now Yes Fredderick Phenix, RN, [place] Disagree/Comply Disagree Caller Understands Yes PreDisposition Did not know what to do Care Advice Given Per Guideline GO TO ED NOW: * You need to be seen in the Emergency Department. * Leave now. Drive carefully. * Another adult should drive. NOTE TO TRIAGER - DRIVING: CARE ADVICE given per Breathing Difficulty (Adult) guideline. Referrals GO TO FACILITY REFUSED

## 2021-10-29 NOTE — ED Triage Notes (Signed)
Patient arrives with complaints of nausea and fatigue x1 week. Patient started having shortness of breath and heart palpitations x1 day.  Patient states that she was also started on Latuda recently.

## 2021-10-29 NOTE — Telephone Encounter (Signed)
See Triage note. Pt in the ED presently.

## 2021-10-29 NOTE — ED Provider Notes (Signed)
[facility] EMERGENCY DEPT Provider Note   CSN: [id number] Arrival date & time: 10/29/21  1031     History  Chief Complaint  Patient presents with   Shortness of Breath   Palpitations    Tiffany [name] is a 71 y.o. female.  Patient presenting today with a complaint of some nausea and fatigue for about a week.  Also feeling of palpitations and shortness of breath has just been a day.  No fevers.  Patient is followed by behavioral health as well as being followed by nephrology for chronic kidney disease.  Patient recently started on Latuda.  Contacted her behavioral health specialist and they decided to hold that because some of her symptoms could be related to side effects of that medicine.  Patient denies any upper respiratory symptoms.  No abdominal pain no vomiting no diarrhea.  Patient's oxygen saturations here are 95% or better.  Not tachycardic respiratory rate not up.  Blood pressure 132/73.  Past medical history significant for Parkinson's disease bipolar disorder patient is on lithium.  The chronic kidney disease patient is scheduled for follow-up with nephrology and has lab work in a week and nephrology follow-up again in 2 weeks.  Was seen by them not too long ago.  Past surgical history is significant for abdominal hysterectomy and cholecystectomy and bilateral mastectomies.  Patient last seen in the emergency department on January 2023 for chest pain.       Home Medications Prior to Admission medications   Medication Sig Start Date End Date Taking? Authorizing Provider  aMILoride (MIDAMOR) 5 MG tablet Take 5 mg by mouth every morning. 10/22/21   [provider]  augmented betamethasone dipropionate (DIPROLENE-AF) 0.05 % cream Apply topically. 09/10/21   [provider]  Azelaic Acid 15 % gel     [provider]  Certolizumab Pegol (CIMZIA) 2 X 200 MG KIT See admin instructions.    [provider]  cholecalciferol (VITAMIN D3)  25 MCG (1000 UNIT) tablet Take 1,000 Units by mouth daily.    [provider]  lamoTRIgine (LAMICTAL) 100 MG tablet Take one tablet in the morning and take one tablet at lunch. 10/04/21   Mozingo, Berdie Ogren, NP  lamoTRIgine (LAMICTAL) 200 MG tablet Take 1 tablet (200 mg total) by mouth at bedtime. 01/24/21   Mozingo, Berdie Ogren, NP  levothyroxine (SYNTHROID) 100 MCG tablet Take 100 mcg by mouth once a week. 10/21/21   [provider]  levothyroxine (SYNTHROID) 112 MCG tablet TAKE 1 TABLET BY MOUTH EVERY DAY BEFORE BREAKFAST 05/31/21   Inda Coke, PA  lithium carbonate 300 MG capsule TAKE 1 CAPSULE(300 MG) BY MOUTH AT BEDTIME 01/24/21   Mozingo, Berdie Ogren, NP  LORazepam (ATIVAN) 1 MG tablet TAKE 1 TABLET(1 MG) BY MOUTH TWICE DAILY AS NEEDED 10/04/21   Mozingo, Berdie Ogren, NP  lurasidone (LATUDA) 20 MG TABS tablet Take 1 tablet (20 mg total) by mouth daily with supper. 10/25/21   Mozingo, Berdie Ogren, NP  metroNIDAZOLE (METROGEL) 0.75 % gel Apply to face 1-2 times daily 06/07/20   Inda Coke, PA  sodium bicarbonate 650 MG tablet Take 650 mg by mouth daily. 12/04/20   [provider]  traZODone (DESYREL) 50 MG tablet Take one to two tablets at bedtime. 10/11/21   Mozingo, Berdie Ogren, NP      Allergies    Aripiprazole, Lactose intolerance (gi), Methotrexate, Cefdinir, Etanercept, Exemestane, Fluoxetine, Lactose, Methylprednisolone sodium succ, Epinephrine, and Nitrofurantoin    Review of Systems  Review of Systems  Constitutional:  Positive for fatigue. Negative for chills and fever.  HENT:  Negative for ear pain and sore throat.   Eyes:  Negative for pain and visual disturbance.  Respiratory:  Positive for shortness of breath. Negative for cough.   Cardiovascular:  Positive for palpitations. Negative for chest pain and leg swelling.  Gastrointestinal:  Positive for nausea. Negative for abdominal pain and vomiting.  Genitourinary:   Negative for dysuria and hematuria.  Musculoskeletal:  Negative for arthralgias and back pain.  Skin:  Negative for color change and rash.  Neurological:  Negative for seizures and syncope.  All other systems reviewed and are negative.   Physical Exam Updated Vital Signs BP 132/81 (BP Location: Right Arm)   Pulse (!) 55   Temp 97.6 F (36.4 C) (Oral)   Resp 18   Ht 1.626 m (_0 )   Wt 91.2 kg   SpO2 100%   BMI 34.50 kg/m  Physical Exam Vitals and nursing note reviewed.  Constitutional:      General: She is not in acute distress.    Appearance: She is well-developed. She is not ill-appearing or toxic-appearing.  HENT:     Head: Normocephalic and atraumatic.     Mouth/Throat:     Mouth: Mucous membranes are moist.  Eyes:     Conjunctiva/sclera: Conjunctivae normal.  Cardiovascular:     Rate and Rhythm: Normal rate and regular rhythm.     Heart sounds: No murmur heard. Pulmonary:     Effort: Pulmonary effort is normal. No respiratory distress.     Breath sounds: Normal breath sounds. No decreased breath sounds, wheezing, rhonchi or rales.  Chest:     Chest wall: No tenderness.  Abdominal:     Palpations: Abdomen is soft.     Tenderness: There is no abdominal tenderness.  Musculoskeletal:        General: No swelling.     Cervical back: Neck supple.     Right lower leg: No edema.     Left lower leg: No edema.  Skin:    General: Skin is warm and dry.     Capillary Refill: Capillary refill takes less than 2 seconds.  Neurological:     General: No focal deficit present.     Mental Status: She is alert and oriented to person, place, and time.     Cranial Nerves: No cranial nerve deficit.     Motor: No weakness.  Psychiatric:        Mood and Affect: Mood normal.     ED Results / Procedures / Treatments   Labs (all labs ordered are listed, but only abnormal results are displayed) Labs Reviewed  BASIC METABOLIC PANEL - Abnormal; Notable for the following  components:      Result Value   CO2 17 (*)    BUN 35 (*)    Creatinine, Ser 2.47 (*)    GFR, Estimated 20 (*)    All other components within normal limits  URINALYSIS, ROUTINE W REFLEX MICROSCOPIC - Abnormal; Notable for the following components:   Protein, ur 30 (*)    All other components within normal limits  SARS CORONAVIRUS 2 BY RT PCR  CBC    EKG EKG Interpretation  Date/Time:  Monday October 29 2021 10:52:02 EDT Ventricular Rate:  59 PR Interval:  166 QRS Duration: 84 QT Interval:  384 QTC Calculation: 380 R Axis:   12 Text Interpretation: Sinus bradycardia Otherwise normal ECG When compared with  ECG of 21-Mar-2021 11:16, PREVIOUS ECG IS PRESENT NO SIGNIFICANT CHANGE SINCE LAST TRACING YESTERDAY Confirmed by Fredia Sorrow [phone]) on 10/29/2021 1:49:34 PM  Radiology DG Chest 2 View  Result Date: 10/29/2021 CLINICAL DATA:  Shortness of breath EXAM: CHEST - 2 VIEW COMPARISON:  07/12/2021 FINDINGS: The heart size and mediastinal contours are within normal limits. No focal airspace consolidation, pleural effusion, or pneumothorax. Degenerative changes within the thoracic spine. IMPRESSION: No active cardiopulmonary disease. Electronically Signed   By: Davina Poke D.O.   On: 10/29/2021 11:48    Procedures Procedures    Medications Ordered in ED Medications  albuterol (VENTOLIN HFA) 108 (90 Base) MCG/ACT inhaler 2 puff (has no administration in time range)    ED Course/ Medical Decision Making/ A&P                           Medical Decision Making Amount and/or Complexity of Data Reviewed Labs: ordered. Radiology: ordered.  Risk Prescription drug management.   Patient's vital signs very reassuring.  Urinalysis negative for urinary tract infection COVID-negative.  Basic metabolic panel significant for a creatinine of 2.47 with a GFR of 20.  Chart review shows that patient's GFR in January was 22.  So certainly she is showing evidence of chronic kidney disease  stage IV.  CBC no leukocytosis hemoglobin good at 14.1.  Chest x-ray no active cardiopulmonary disease.  Specifically no evidence of any pulmonary edema or pneumonia.  EKG without any acute findings.  Patient very nontoxic comfortable no acute distress.  Patient's behavioral health provider thought the Latuda could be responsible for this and she just stopped that last night and again to see if some of the symptoms improved.  Patient has follow-up with her nephrologist lab work next week and then follow-up again in 2 weeks which is important to closely monitor her kidney function.  Patient stable for discharge home.  The chronic kidney disease is nothing new but needs to be monitored carefully.  Potassium was normal at 5.1 but a little higher than her normal range around 4.7.  Patient will return for any new or worse symptoms Final Clinical Impression(s) / ED Diagnoses Final diagnoses:  Stage 4 chronic kidney disease [facility])    Rx / DC Orders ED Discharge Orders     None         Fredia Sorrow, MD 10/29/21 1534

## 2021-10-29 NOTE — Telephone Encounter (Signed)
Pt was going to the ED.

## 2021-10-29 NOTE — Discharge Instructions (Signed)
Keep your follow-up with your nephrologist.  Have them monitor your renal function carefully.  I agree with the stopping of the [facility] for now.  Also follow back up with your behavioral health specialist.  Today's work-up without any acute findings.  COVID testing was negative.  Return for any new or worse symptoms.

## 2021-10-29 NOTE — Telephone Encounter (Signed)
Pt states she is having shortness of breath and very fatigued. Currently being triaged.

## 2021-10-30 ENCOUNTER — Ambulatory Visit (INDEPENDENT_AMBULATORY_CARE_PROVIDER_SITE_OTHER): Payer: Medicare Other | Admitting: Adult Health

## 2021-10-30 ENCOUNTER — Encounter: Payer: Self-pay | Admitting: Adult Health

## 2021-10-30 DIAGNOSIS — F4310 Post-traumatic stress disorder, unspecified: Secondary | ICD-10-CM | POA: Diagnosis not present

## 2021-10-30 DIAGNOSIS — F411 Generalized anxiety disorder: Secondary | ICD-10-CM

## 2021-10-30 DIAGNOSIS — G4700 Insomnia, unspecified: Secondary | ICD-10-CM

## 2021-10-30 DIAGNOSIS — F3132 Bipolar disorder, current episode depressed, moderate: Secondary | ICD-10-CM

## 2021-10-30 NOTE — Progress Notes (Signed)
Tiffany [name] [id number] Aug 02, 1950 71 y.o.  Subjective:   Patient ID:  Tiffany [name] is a 71 y.o. (DOB 03/11/1951) female.  Chief Complaint: No chief complaint on file.   HPI Tiffany [name] presents to the office today for follow-up of PTSD, insomnia, GAD, BPD 1.  Accompanied by husband.  Describes mood today as "ok". Pleasant. Tearful at times. Mood symptoms - reports depression, anxiety, and irritability. Reports worry and rumination. Reports some obsessive thoughts - irrational thoughts. Reporting decreased flashbacks  Reports mood lower. Reports mood is cycling again - happy and sad. Did not tolerate the addition of Latuda - took 2 doses and experienced heart palpitations and SOB. Stating "I'm about the same". Continues to look for a trauma therapist - has an appointment with therapist this week. Varying interest and motivation. Taking medications as prescribed. Energy levels vary. Active, does not have a regular exercise routine - knee pain - working with P/T.   Enjoys some usual interests and activities. Married. Lives with husband their daughter. Talking to family and friends.  Appetite improved. Weight stable 196 pounds Sleeping better some nights than others. Averages 7 to 8 hours of broken sleep - up and down during the night going to the bathroom. Focus and concentration difficulties. Completing tasks. Managing some aspects of household. Retired.  Denies SI.  Denies HI.  Denies AH Denies VH  Denies self harm. Denies substance use.   Previous medications: Celexa, Zyprexa, Tegretol, Depakote, Serzone, Topamax, Seroquel, Effexor, Lexapro, Desipramine, Neurontin, Abilify, Geodon, Propanolol, Cymbalta, Cogentin, Trihexyphenadyl, Sinmmet, Provigil, Selegiline, Requip, Amantadine, Prozac, Mirapex, Azilect, Metoclopramide, Baclofen, Artane, Namenda, Latuda -SOB and heart palpitations.    [place] Office Visit from 02/16/2021 in [facility]  Visit from 05/14/2019 in [facility]  Total GAD-7 Score 13 [place] Office Visit from 06/07/2019 in [facility]  Total Score (max 30 points ) 29      PHQ2-9    [place] Visit from 07/30/2021 in [facility] Visit from 07/02/2021 in [facility] from 04/02/2021 in [facility] Visit from 02/16/2021 in [facility] from 07/26/2020 in [facility]  PHQ-2 Total Score 2 1 1 3 1   PHQ-9 Total Score 11 6 1 6  --      Flowsheet Row Clinical Support from 04/02/2021 in [facility] ED from 03/21/2021 in [facility] ED from 02/03/2021 in [facility] [place] No Risk No Risk No Risk        Review of Systems:  Review of Systems  Musculoskeletal:  Negative for gait problem.  Neurological:  Negative for tremors.  Psychiatric/Behavioral:         Please refer to HPI    Medications: I have reviewed the patient's current medications.  Current Outpatient Medications  Medication Sig Dispense Refill   aMILoride (MIDAMOR) 5 MG tablet Take 5 mg by mouth every morning.     augmented betamethasone dipropionate (DIPROLENE-AF) 0.05 % cream Apply topically.     Azelaic Acid 15 % gel      Certolizumab Pegol (CIMZIA) 2 X 200 MG KIT See admin instructions.     cholecalciferol (VITAMIN D3) 25 MCG (1000 UNIT) tablet Take 1,000 Units by mouth daily.     lamoTRIgine (LAMICTAL) 100 MG tablet Take  one tablet in the morning and take one tablet at lunch. 180 tablet 1   lamoTRIgine (LAMICTAL) 200 MG tablet Take 1 tablet (200 mg total) by mouth at bedtime. 90 tablet 3   levothyroxine (SYNTHROID) 100 MCG tablet Take 100 mcg by mouth once a week.     levothyroxine (SYNTHROID) 112 MCG tablet TAKE 1 TABLET BY  MOUTH EVERY DAY BEFORE BREAKFAST 90 tablet 0   lithium carbonate 300 MG capsule TAKE 1 CAPSULE(300 MG) BY MOUTH AT BEDTIME 90 capsule 3   LORazepam (ATIVAN) 1 MG tablet TAKE 1 TABLET(1 MG) BY MOUTH TWICE DAILY AS NEEDED 60 tablet 2   lurasidone (LATUDA) 20 MG TABS tablet Take 1 tablet (20 mg total) by mouth daily with supper. 30 tablet 2   metroNIDAZOLE (METROGEL) 0.75 % gel Apply to face 1-2 times daily 45 g 0   sodium bicarbonate 650 MG tablet Take 650 mg by mouth daily.     traZODone (DESYREL) 50 MG tablet Take one to two tablets at bedtime. 90 tablet 3   No current facility-administered medications for this visit.    Medication Side Effects: None  Allergies:  Allergies  Allergen Reactions   Aripiprazole Other (See Comments)    Parkinsonism      Lactose Intolerance (Gi) Diarrhea   Methotrexate Other (See Comments)    Hair loss, severe stomatitis     Cefdinir Diarrhea    Other reaction(s): Diarrhea Yeast infection and fever; negative c diff   Etanercept Other (See Comments)    Headaches     Exemestane Other (See Comments)    Suicidal thoughts with medication     Fluoxetine Other (See Comments)    Parkinsonism   Lactose     Other reaction(s): diarrhea Other reaction(s): diarrhea Other reaction(s): diarrhea   Methylprednisolone Sodium Succ Other (See Comments)    Agitated mania   Epinephrine Palpitations    tachycardia    Nitrofurantoin Nausea And Vomiting and Rash      Other reaction(s): rash, diarrhea Other reaction(s): rash, diarrhea Other reaction(s): rash, diarrhea Other reaction(s): rash, diarrhea Other reaction(s): rash, diarrhea Other reaction(s): rash, diarrhea    Past Medical History:  Diagnosis Date   Bipolar 1 disorder (HCC)    Breast cancer (HCC)    Chronic diarrhea    loose stools twice a day on average for years   Chronic kidney disease (CKD), stage III (moderate) ([place])    Depression 1987   Family history of breast cancer     Hospitalization or health care facility admission within last 6 months 04/2019   for fall/seizures   Malignant neoplasm of overlapping sites of left breast in female, estrogen receptor positive ([place]) 03/14/2016   Dx in 09/2014, s/p bilateral mastectomies and ALND, 0/10 LN. 1.4 cm  Grade I invasive lobular, ER and PR +/Her--, Ki 67 <5% Tried anastrozole for one month, but developed suicidal idea   Osteoarthritis, knee 09/24/2019   Xray 09/2019   Parkinson's disease ([place]) 2012   Personal history of malignant neoplasm of breast    Polyposis of colon    Psoriatic arthritis ([place])    PTSD (post-traumatic stress disorder)    Secondary hyperparathyroidism ([place])    Tardive dyskinesia    Thyroid disease     Past Medical History, Surgical history, Social history, and Family history were reviewed and updated as appropriate.   Please see review of systems for further details on the patient's review from today.   Objective:   Physical Exam:  There  were no vitals taken for this visit.  Physical Exam Constitutional:      General: She is not in acute distress. Musculoskeletal:        General: No deformity.  Neurological:     Mental Status: She is alert and oriented to person, place, and time.     Coordination: Coordination normal.  Psychiatric:        Attention and Perception: Attention and perception normal. She does not perceive auditory or visual hallucinations.        Mood and Affect: Mood normal. Mood is not anxious or depressed. Affect is not labile, blunt, angry or inappropriate.        Speech: Speech normal.        Behavior: Behavior normal.        Thought Content: Thought content normal. Thought content is not paranoid or delusional. Thought content does not include homicidal or suicidal ideation. Thought content does not include homicidal or suicidal plan.        Cognition and Memory: Cognition and memory normal.        Judgment: Judgment normal.     Comments: Insight intact      Lab Review:     Component Value Date/Time   NA 137 10/29/2021 1055   NA 138 06/08/2019 0000   K 5.1 10/29/2021 1055   CL 110 10/29/2021 1055   CO2 17 (L) 10/29/2021 1055   GLUCOSE 91 10/29/2021 1055   BUN 35 (H) 10/29/2021 1055   BUN 29 (A) 06/08/2019 0000   CREATININE 2.47 (H) 10/29/2021 1055   CREATININE 2.29 (H) 05/22/2021 1044   CALCIUM 9.7 10/29/2021 1055   PROT 7.6 07/02/2021 1048   ALBUMIN 4.2 07/02/2021 1048   AST 13 07/02/2021 1048   ALT 14 07/02/2021 1048   ALKPHOS 125 (H) 07/02/2021 1048   BILITOT 1.2 07/02/2021 1048   GFRNONAA 20 (L) 10/29/2021 1055   GFRAA 25 (L) 09/13/2019 1243       Component Value Date/Time   WBC 9.5 10/29/2021 1055   RBC 4.64 10/29/2021 1055   HGB 14.1 10/29/2021 1055   HCT 44.1 10/29/2021 1055   PLT 214 10/29/2021 1055   MCV 95.0 10/29/2021 1055   MCH 30.4 10/29/2021 1055   MCHC 32.0 10/29/2021 1055   RDW 13.2 10/29/2021 1055   LYMPHSABS 3.2 07/02/2021 1048   MONOABS 0.7 07/02/2021 1048   EOSABS 0.2 07/02/2021 1048   BASOSABS 0.1 07/02/2021 1048    Lithium Lvl  Date Value Ref Range Status  05/22/2021 0.7 0.6 - 1.2 mmol/L Final     No results found for: "PHENYTOIN", "PHENOBARB", "VALPROATE", "CBMZ"   .res Assessment: Plan:    Plan:  Therapist - Christina Hussami - [facility]  Lithium 377m daily  Trazadone 566m- 1 to 2 at bedtime Lorazepam 71m46mID for anxiety - may take one tablet extra for severe anxiety symptoms. Taking one at night routinely. Lamictal 200m16m Lamictal 150mg67mry morning.  Discussed NAC tablets for obsessive thoughts, worry, and rumination - unable to take SSRI's  RTC 4 weeks  Counseled patient regarding potential benefits, risks, and side effects of Lamictal to include potential risk of Stevens-Johnson syndrome. Advised patient to stop taking Lamictal and contact office immediately if rash develops and to seek urgent medical attention if rash is severe and/or spreading quickly.  Discussed  potential benefits, risk, and side effects of benzodiazepines to include potential risk of tolerance and dependence, as well as possible drowsiness.  Advised patient not  to drive if experiencing drowsiness and to take lowest possible effective dose to minimize risk of dependence and tolerance.   There are no diagnoses linked to this encounter.   Please see After Visit Summary for patient specific instructions.  Future Appointments  Date Time Provider [place]  11/02/2021 11:00 AM Hussami, Rachel Bo, LCSW [facility] None  11/07/2021 11:20 AM Meleana Commerford, Berdie Ogren, NP [facility] None  12/10/2021 10:00 AM Inda Coke, PA [facility]  12/31/2021 11:30 AM Shamleffer, Melanie Crazier, MD [facility] None  04/15/2022  1:45 PM [facility] HEALTH COACH [facility]    No orders of the defined types were placed in this encounter.   -------------------------------

## 2021-10-31 ENCOUNTER — Ambulatory Visit: Payer: Medicare Other | Admitting: Psychiatry

## 2021-11-01 ENCOUNTER — Ambulatory Visit: Payer: Medicare Other | Admitting: Adult Health

## 2021-11-02 ENCOUNTER — Encounter: Payer: Self-pay | Admitting: Physician Assistant

## 2021-11-02 ENCOUNTER — Ambulatory Visit (INDEPENDENT_AMBULATORY_CARE_PROVIDER_SITE_OTHER): Payer: Medicare Other | Admitting: Licensed Clinical Social Worker

## 2021-11-02 ENCOUNTER — Ambulatory Visit (INDEPENDENT_AMBULATORY_CARE_PROVIDER_SITE_OTHER): Payer: Medicare Other | Admitting: Physician Assistant

## 2021-11-02 VITALS — BP 120/60 | HR 64 | Temp 98.8°F | Ht 64.0 in | Wt 202.4 lb

## 2021-11-02 DIAGNOSIS — F4310 Post-traumatic stress disorder, unspecified: Secondary | ICD-10-CM

## 2021-11-02 DIAGNOSIS — N184 Chronic kidney disease, stage 4 (severe): Secondary | ICD-10-CM

## 2021-11-02 DIAGNOSIS — E039 Hypothyroidism, unspecified: Secondary | ICD-10-CM

## 2021-11-02 DIAGNOSIS — F411 Generalized anxiety disorder: Secondary | ICD-10-CM | POA: Diagnosis not present

## 2021-11-02 DIAGNOSIS — F3160 Bipolar disorder, current episode mixed, unspecified: Secondary | ICD-10-CM | POA: Diagnosis not present

## 2021-11-02 DIAGNOSIS — F3132 Bipolar disorder, current episode depressed, moderate: Secondary | ICD-10-CM | POA: Diagnosis not present

## 2021-11-02 NOTE — Progress Notes (Signed)
   THERAPIST PROGRESS NOTE  Session Time: 11-12p  Behavioral Health Outpatient [facility] in office visit for patient and LCSW clinician  Pt provides verbal consent for husband, Baileigh Modisette, to be present throughout session  Participation Level: {BHH PARTICIPATION LEVEL:22264}  Behavioral Response: {Appearance:22683}{BHH LEVEL OF CONSCIOUSNESS:22305}{BHH MOOD:22306}  Type of Therapy: {CHL AMB BH Type of Therapy:21022741}  Treatment Goals addressed: ***  ProgressTowards Goals: {Progress Towards Goals:21014066}  Interventions: {CHL AMB BH Type of Intervention:21022753}  Summary: Tiffany [name] is a 71 y.o. female who presents with ***.   Suicidal/Homicidal: {BHH YES OR NO:22294}{yes/no/with/without intent/plan:22693}  Therapist Response: ***  Plan: Return again in *** weeks.  Diagnosis: No diagnosis found.  Collaboration of Care: {BH OP Collaboration of Care:21014065}  Patient/Guardian was advised Release of Information must be obtained prior to any record release in order to collaborate their care with an outside provider. Patient/Guardian was advised if they have not already done so to contact the registration department to sign all necessary forms in order for [place] to release information regarding their care.   Consent: Patient/Guardian gives verbal consent for treatment and assignment of benefits for services provided during this visit. Patient/Guardian expressed understanding and agreed to proceed.   [place], LCSW 11/02/2021

## 2021-11-02 NOTE — Progress Notes (Signed)
Tiffany [name] is a 71 y.o. female here for a follow up of a pre-existing problem.  History of Present Illness:   Chief Complaint  Patient presents with   Discuss Health    HPI  CKD stage IV She was started on Amiloride 5 mg daily by her nephrologist to help with her renal function while she is taking the lithium. She is currently taking lithium 300 mg daily and this is one of the only medications that she has been able to tolerate for her mental health. She is wanting to find a new nephrologist. She is having issues with communication with her current nephrologist. She has reached out to them several times regarding need for nutrition information + CKD IV however they have not been helpful. She is worried about the progression of her disease.  PTSD; Bipolar Disorder She is seeing [facility] regularly. She is currently taking lamictal, lithium, trazodone. She is still looking for a talk therapist.  Hypothyroidism Saw Dr. Chalmers Cater on May 24 -- she is currently taking levothyroxine 112 mcg 5 days a week and 100 mcg levothyroxine 2 days a week. Denies concerns with this.    Past Medical History:  Diagnosis Date   Bipolar 1 disorder ([place])    Breast cancer ([place])    Chronic diarrhea    loose stools twice a day on average for years   Chronic kidney disease (CKD), stage III (moderate) ([place])    Depression 1987   Family history of breast cancer    Hospitalization or health care facility admission within last 6 months 04/2019   for fall/seizures   Malignant neoplasm of overlapping sites of left breast in female, estrogen receptor positive ([place]) 03/14/2016   Dx in 09/2014, s/p bilateral mastectomies and ALND, 0/10 LN. 1.4 cm  Grade I invasive lobular, ER and PR +/Her--, Ki 67 <5% Tried anastrozole for one month, but developed suicidal idea   Osteoarthritis, knee 09/24/2019   Xray 09/2019   Parkinson's disease ([place]) 2012   Personal history of malignant neoplasm of breast    Polyposis of colon     Psoriatic arthritis ([place])    PTSD (post-traumatic stress disorder)    Secondary hyperparathyroidism ([place])    Tardive dyskinesia    Thyroid disease      Social History   Tobacco Use   Smoking status: Never   Smokeless tobacco: Never  Vaping Use   Vaping Use: Never used  Substance Use Topics   Alcohol use: Never   Drug use: Never    Past Surgical History:  Procedure Laterality Date   ABDOMINAL HYSTERECTOMY  1987   CHOLECYSTECTOMY  1979   DILATION AND CURETTAGE OF UTERUS  1973   MASTECTOMY Bilateral 09/19/2014   TONSILLECTOMY  1970   URETERAL REIMPLANTION Bilateral 1974    Family History  Problem Relation Age of Onset   Lymphoma Mother 73   Lymphoma Sister 64   Breast cancer Sister 13   Colon polyps Sister    Lung cancer Sister        former smoker   Stroke Maternal Grandfather    Diabetes Paternal Grandfather    Colon cancer Neg Hx    Esophageal cancer Neg Hx    Stomach cancer Neg Hx    Rectal cancer Neg Hx     Allergies  Allergen Reactions   Aripiprazole Other (See Comments)    Parkinsonism      Lactose Intolerance (Gi) Diarrhea   Methotrexate Other (See Comments)  Hair loss, severe stomatitis     Cefdinir Diarrhea    Other reaction(s): Diarrhea Yeast infection and fever; negative c diff   Etanercept Other (See Comments)    Headaches     Exemestane Other (See Comments)    Suicidal thoughts with medication     Fluoxetine Other (See Comments)    Parkinsonism   Lactose     Other reaction(s): diarrhea Other reaction(s): diarrhea Other reaction(s): diarrhea   Methylprednisolone Sodium Succ Other (See Comments)    Agitated mania   Epinephrine Palpitations    tachycardia    Nitrofurantoin Nausea And Vomiting and Rash      Other reaction(s): rash, diarrhea Other reaction(s): rash, diarrhea Other reaction(s): rash, diarrhea Other reaction(s): rash, diarrhea Other reaction(s): rash, diarrhea Other reaction(s): rash, diarrhea    Current  Medications:   Current Outpatient Medications:    aMILoride (MIDAMOR) 5 MG tablet, Take 5 mg by mouth every morning., Disp: , Rfl:    augmented betamethasone dipropionate (DIPROLENE-AF) 0.05 % cream, Apply topically., Disp: , Rfl:    Azelaic Acid 15 % gel, Apply topically as needed., Disp: , Rfl:    B Complex Vitamins (VITAMIN B COMPLEX) TABS, 1 tablet Orally Once daily, Disp: , Rfl:    benzonatate (TESSALON) 100 MG capsule, 1 capsule as needed Orally Three times a day, Disp: , Rfl:    cholecalciferol (VITAMIN D3) 25 MCG (1000 UNIT) tablet, Take 1,000 Units by mouth daily., Disp: , Rfl:    Cholecalciferol 50 MCG (2000 UT) TABS, 1 tablet Orally Once a day for 30 day(s), Disp: , Rfl:    Golimumab (SIMPONI ARIA IV), , Disp: , Rfl:    lamoTRIgine (LAMICTAL) 100 MG tablet, Take one tablet in the morning and take one tablet at lunch., Disp: 180 tablet, Rfl: 1   lamoTRIgine (LAMICTAL) 200 MG tablet, Take 1 tablet (200 mg total) by mouth at bedtime., Disp: 90 tablet, Rfl: 3   levothyroxine (LEVOXYL) 100 MCG tablet, Take 100 mcg by mouth. Takes 100 mg Saturday and Sunday, Disp: , Rfl:    levothyroxine (SYNTHROID) 112 MCG tablet, Take 112 mcg by mouth. Takes days Monday through Friday., Disp: , Rfl:    lithium carbonate 300 MG capsule, TAKE 1 CAPSULE(300 MG) BY MOUTH AT BEDTIME, Disp: 90 capsule, Rfl: 3   LORazepam (ATIVAN) 1 MG tablet, TAKE 1 TABLET(1 MG) BY MOUTH TWICE DAILY AS NEEDED, Disp: 60 tablet, Rfl: 2   metroNIDAZOLE (METROGEL) 0.75 % gel, Apply to face 1-2 times daily, Disp: 45 g, Rfl: 0   Omega 3 340 MG CPDR, 1 capsule Orally Once a day for 30 day(s), Disp: , Rfl:    sodium bicarbonate 650 MG tablet, 1 tablet Orally Once daily, Disp: , Rfl:    traZODone (DESYREL) 50 MG tablet, Take one to two tablets at bedtime., Disp: 90 tablet, Rfl: 3   Review of Systems:   ROS Negative unless otherwise specified per HPI.  Vitals:   Vitals:   11/02/21 1516  BP: 120/60  Pulse: 64  Temp: 98.8 F  (37.1 C)  TempSrc: Temporal  SpO2: 96%  Weight: 202 lb 6.1 oz (91.8 kg)  Height: '5\' 4"'$  (1.626 m)     Body mass index is 34.74 kg/m.  Physical Exam:   Physical Exam Vitals and nursing note reviewed.  Constitutional:      General: She is not in acute distress.    Appearance: She is well-developed. She is not ill-appearing or toxic-appearing.  Cardiovascular:  Rate and Rhythm: Normal rate and regular rhythm.     Pulses: Normal pulses.     Heart sounds: Normal heart sounds, S1 normal and S2 normal.  Pulmonary:     Effort: Pulmonary effort is normal.     Breath sounds: Normal breath sounds.  Skin:    General: Skin is warm and dry.  Neurological:     Mental Status: She is alert.     GCS: GCS eye subscore is 4. GCS verbal subscore is 5. GCS motor subscore is 6.  Psychiatric:        Mood and Affect: Affect is tearful.        Speech: Speech normal.        Behavior: Behavior normal. Behavior is cooperative.     Assessment and Plan:   CKD (chronic kidney disease) stage 4, GFR 15-29 ml/min (HCC) Reviewed most recent nephrology note Placed referral to [facility] in [facility] for her We reviewed CKD diet education -- mainly salt reduction and protein reduction Referral for RD placed for formal education as well  Bipolar affective disorder, mixed ([place]); PTSD (post-traumatic stress disorder) Uncontrolled Denies SI/HI Continue close care with psych Husband is excellent support system and helps her tremendously  Acquired hypothyroidism Well controlled She is seeing new endocrinologist in a few weeks  Time spent with patient today was 47 minutes which consisted of chart review, discussing diagnosis, work up, treatment answering questions and documentation.   Inda Coke, PA-C

## 2021-11-03 NOTE — Plan of Care (Signed)
  Problem: Bipolar Disorder  Goal: Decrease depressive/manic symptoms and return to improved levels of effective functioning per pt self report 3 out of 5 sessions documented.   Outcome: Progressing Intervention: Assess for impulsive behaviors Note: None reported at time of session Intervention: Encourage patient to set small goals for self Note: Encouraged w/ pt and husband   Problem: Depression Goal:  Decrease depressive symptoms and improve levels of effective functioning-pt reports a decrease in overall depression symptoms 3 out of 5 sessions documented.  Outcome: Progressing Goal: Develop healthy thinking patterns and beliefs about self, others, and the world that lead to the alleviation and help prevent the relapse of depression per self report 3 out of 5 sessions documented.   Outcome: Progressing Intervention: Discuss self-management skills Note: Reviewed--positive, assertive communication w/ medical providers.

## 2021-11-06 ENCOUNTER — Encounter: Payer: Medicare Other | Attending: Physician Assistant | Admitting: Skilled Nursing Facility1

## 2021-11-06 ENCOUNTER — Encounter: Payer: Self-pay | Admitting: Skilled Nursing Facility1

## 2021-11-06 DIAGNOSIS — N184 Chronic kidney disease, stage 4 (severe): Secondary | ICD-10-CM | POA: Diagnosis present

## 2021-11-06 NOTE — Progress Notes (Signed)
Medical Nutrition Therapy   Primary concerns today: renal health  Referral diagnosis: n18.4    NUTRITION ASSESSMENT    Clinical Medical Hx: anxiety, arthritis, cancer, depression CKD stage 4, thyroid, PTSD Medications: see list Labs: CO2, BUN 35, creatinine 2.47,GFR 20, Potassium 5.1 Notable Signs/Symptoms: some diarrhea (states she thinks it is stress); constipation sometimes   Lifestyle & Dietary Hx  Pt states she is a little off today with her mobility.   Pt states she actually does not have parkinson's it was a reaction from a medication.  Pt states the arhtirits and her knee limits her mobility and the medications she takes give her balance issues. Pt states she does do PT.   Pt states she was advised the lithium will deteriorate her kidneys.   Pts husband makes her meals and came with her to the appt today.   Pt states water gives her diarrhea and abdominal pain.  Pt states she does not eat cheese or mayo.  Pt states she will wake in the middle of the night and eat cereal 2 times a month.   Estimated daily fluid intake: 50 oz Supplements: vitamin D, super B complex, fish oil, anacetyl L systeine, tylenol  Sleep:  Stress / self-care:  Current average weekly physical activity: pool 2 times a week  24-Hr Dietary Recall First Meal: oatmeal + cinn unsweet applesauce + walnuts + grapes + strawberries + blueberries + peaches Snack: peanut butter cracker in an emergency Second Meal 11:30-12:30: eaten out [place] [employer] on a wrap sometimes hot chips dipped in ranch Snack:  Third Meal: eaten out: berry salad sometimes eaten at home Snack:  Beverages: water + cranberry juice, unsweet tea + 2 splenda total   NUTRITION INTERVENTION  Nutrition education (E-1) on the following topics:  Label reading looking for phos and potassium Processed foods within the context of renal health  Handouts Provided Include  Renal Detailed MyPlate  Learning Style & Readiness for  Change Teaching method utilized: Visual & Auditory  Demonstrated degree of understanding via: Teach Back  Barriers to learning/adherence to lifestyle change: none identified  Goals Established by Pt Add in another glass of water Avoid eating out more than once a week Choose whole fresh fruits instead or dried or canned Include more plant based proteins Try edamame Read your labels looking for "phos" Avoid eating lunch meat: choose fresher meats or seafood   MONITORING & EVALUATION Dietary intake, weekly physical activity  Next Steps  Patient is to return in 3 months

## 2021-11-07 ENCOUNTER — Ambulatory Visit (INDEPENDENT_AMBULATORY_CARE_PROVIDER_SITE_OTHER): Payer: Medicare Other | Admitting: Adult Health

## 2021-11-07 ENCOUNTER — Encounter: Payer: Self-pay | Admitting: Adult Health

## 2021-11-07 DIAGNOSIS — F411 Generalized anxiety disorder: Secondary | ICD-10-CM | POA: Diagnosis not present

## 2021-11-07 DIAGNOSIS — F3132 Bipolar disorder, current episode depressed, moderate: Secondary | ICD-10-CM

## 2021-11-07 DIAGNOSIS — F319 Bipolar disorder, unspecified: Secondary | ICD-10-CM

## 2021-11-07 DIAGNOSIS — F4310 Post-traumatic stress disorder, unspecified: Secondary | ICD-10-CM | POA: Diagnosis not present

## 2021-11-07 DIAGNOSIS — F99 Mental disorder, not otherwise specified: Secondary | ICD-10-CM

## 2021-11-07 DIAGNOSIS — F5105 Insomnia due to other mental disorder: Secondary | ICD-10-CM

## 2021-11-07 NOTE — Progress Notes (Signed)
Tiffany [name] [id number] November 19, 1950 71 y.o.  Subjective:   Patient ID:  Tiffany [name] is a 71 y.o. (DOB [name] 29, 1951) female.  Chief Complaint: No chief complaint on file.   HPI [name] presents to the office today for follow-up of PTSD, insomnia, GAD, BPD 1.  Accompanied by husband.  Describes mood today as "about the same". Pleasant. Tearful at times. Mood symptoms - reports some depression, anxiety and irritability. Reports worry and rumination. Reports mood is lower. Denies mania. Denies cycling. Constantly thinking and worrying about her health - kidney failure. Stating "I feel overwhelmed with medical issues" - Is uncertain of progression or possible outcomes of her kidney disease. Has started the NAC tabs and has tolerated the '600mg'$  dose - plans to increase to '1200mg'$ . Meet with a renal dietician yesterday to understand a diet that is specific to her limitations. Stating "I feels it was helpful". Saw [facility] therapist Jeanmarie Plant on 8/29 - plans to see every 3 weeks. Planning to see a truma focused therapist. Varying interest and motivation. Taking medications as prescribed. Energy levels vary. Active, does not have a regular exercise routine - knee pain - working with P/T.  Plans to start pool exercising on Tuesday and Thursdays.  Enjoys some usual interests and activities. Married. Lives with husband their daughter. Talking to family and friends.  Appetite improved. Weight stable 196 pounds Sleeping better some nights than others. Averages 7 to 8 hours of broken sleep - up and down during the night going to the bathroom. Focus and concentration difficulties - "forgetful". Completing tasks. Managing some aspects of household. Retired.  Denies SI.  Denies HI.  Denies AH Denies VH  Denies self harm. Denies substance use.   Previous medications: Celexa, Zyprexa, Tegretol, Depakote, Serzone, Topamax, Seroquel, Effexor, Lexapro, Desipramine, Neurontin, Abilify, Geodon, Propanolol,  Cymbalta, Cogentin, Trihexyphenadyl, Sinmmet, Provigil, Selegiline, Requip, Amantadine, Prozac, Mirapex, Azilect, Metoclopramide, Baclofen, Artane, Namenda, Latuda -SOB and heart palpitations.   [place] Office Visit from 02/16/2021 in [place] Visit from 05/14/2019 in [place]  Total GAD-7 Score 13 [place] Visit from 06/07/2019 in [facility]  Total Score (max 30 points ) 29      PHQ2-9    Flowsheet Row Nutrition [facility] 11/[facility]/2021 [facility] Visit from 07/30/2021 in [place] Visit from 07/02/2021 in [place] from 04/02/2021 in [place] Visit from 02/16/2021 in [facility]  PHQ-2 Total Score 0 '2 1 1 3  '$ PHQ-9 Total Score -- '11 6 1 6      '$ Flowsheet Row Clinical Support from 04/02/2021 in [facility] from 03/21/2021 in [facility] ED from 02/03/2021 in [facility] No Risk No Risk No Risk        Review of Systems:  Review of Systems  Medications: I have reviewed the patient's current medications.  Current Outpatient Medications  Medication Sig Dispense Refill   aMILoride (MIDAMOR) 5 MG tablet Take 5 mg by mouth every morning.     augmented betamethasone dipropionate (DIPROLENE-AF) 0.05 % cream Apply topically.     Azelaic Acid 15 % gel Apply topically as needed.     B Complex Vitamins (VITAMIN B COMPLEX) TABS 1 tablet Orally Once daily  benzonatate (TESSALON) 100 MG capsule 1 capsule as needed Orally Three times a day     cholecalciferol (VITAMIN D3) 25 MCG (1000 UNIT) tablet Take 1,000 Units by mouth daily.     Cholecalciferol 50 MCG (2000 UT) TABS 1 tablet Orally Once a day for 30  day(s)     Golimumab (SIMPONI ARIA IV)      lamoTRIgine (LAMICTAL) 100 MG tablet Take one tablet in the morning and take one tablet at lunch. 180 tablet 1   lamoTRIgine (LAMICTAL) 200 MG tablet Take 1 tablet (200 mg total) by mouth at bedtime. 90 tablet 3   levothyroxine (SYNTHROID) 100 MCG tablet Take 100 mcg by mouth. Takes 100 mg Saturday and Sunday     levothyroxine (SYNTHROID) 112 MCG tablet Take 112 mcg by mouth. Takes days Monday through Friday.     lithium carbonate 300 MG capsule TAKE 1 CAPSULE(300 MG) BY MOUTH AT BEDTIME 90 capsule 3   LORazepam (ATIVAN) 1 MG tablet TAKE 1 TABLET(1 MG) BY MOUTH TWICE DAILY AS NEEDED 60 tablet 2   metroNIDAZOLE (METROGEL) 0.75 % gel Apply to face 1-2 times daily 45 g 0   Omega 3 340 MG CPDR 1 capsule Orally Once a day for 30 day(s)     sodium bicarbonate 650 MG tablet 1 tablet Orally Once daily     traZODone (DESYREL) 50 MG tablet Take one to two tablets at bedtime. 90 tablet 3   No current facility-administered medications for this visit.    Medication Side Effects: None  Allergies:  Allergies  Allergen Reactions   Aripiprazole Other (See Comments)    Parkinsonism      Lactose Intolerance (Gi) Diarrhea   Methotrexate Other (See Comments)    Hair loss, severe stomatitis     Cefdinir Diarrhea    Other reaction(s): Diarrhea Yeast infection and fever; negative c diff   Etanercept Other (See Comments)    Headaches     Exemestane Other (See Comments)    Suicidal thoughts with medication     Fluoxetine Other (See Comments)    Parkinsonism   Lactose     Other reaction(s): diarrhea Other reaction(s): diarrhea Other reaction(s): diarrhea   Methylprednisolone Sodium Succ Other (See Comments)    Agitated mania   Epinephrine Palpitations    tachycardia    Nitrofurantoin Nausea And Vomiting and Rash      Other reaction(s): rash, diarrhea Other reaction(s): rash, diarrhea Other reaction(s): rash, diarrhea Other reaction(s): rash,  diarrhea Other reaction(s): rash, diarrhea Other reaction(s): rash, diarrhea    Past Medical History:  Diagnosis Date   Bipolar 1 disorder (HCC)    Breast cancer (HCC)    Chronic diarrhea    loose stools twice a day on average for years   Chronic kidney disease (CKD), stage III (moderate) ([employer])    Depression 1987   Family history of breast cancer    Hospitalization or health care facility admission within last 6 months 04/2019   for fall/seizures   Malignant neoplasm of overlapping sites of left breast in female, estrogen receptor positive ([place]) 03/14/2016   Dx in 09/2014, s/p bilateral mastectomies and ALND, 0/10 LN. 1.4 cm  Grade I invasive lobular, ER and PR +/Her--, Ki 67 <5% Tried anastrozole for one month, but developed suicidal idea   Osteoarthritis, knee 09/24/2019   Xray 09/2019   Parkinson's disease ([place]) 2012   Personal history of malignant neoplasm of breast    Polyposis of colon    Psoriatic arthritis ([place])  PTSD (post-traumatic stress disorder)    Secondary hyperparathyroidism ([place])    Tardive dyskinesia    Thyroid disease     Past Medical History, Surgical history, Social history, and Family history were reviewed and updated as appropriate.   Please see review of systems for further details on the patient's review from today.   Objective:   Physical Exam:  There were no vitals taken for this visit.  Physical Exam  Lab Review:     Component Value Date/Time   NA 137 10/29/2021 1055   NA 142 10/17/2021 0000   K 5.1 10/29/2021 1055   CL 110 10/29/2021 1055   CO2 17 (L) 10/29/2021 1055   GLUCOSE 91 10/29/2021 1055   BUN 35 (H) 10/29/2021 1055   BUN 30 (A) 10/17/2021 0000   CREATININE 2.47 (H) 10/29/2021 1055   CREATININE 2.29 (H) 05/22/2021 1044   CALCIUM 9.7 10/29/2021 1055   PROT 7.6 07/02/2021 1048   ALBUMIN 4.8 10/17/2021 0000   AST 13 07/02/2021 1048   ALT 14 07/02/2021 1048   ALKPHOS 125 (H) 07/02/2021 1048   BILITOT 1.2 07/02/2021 1048    GFRNONAA 20 (L) 10/29/2021 1055   GFRAA 25 (L) 09/13/2019 1243       Component Value Date/Time   WBC 9.5 10/29/2021 1055   RBC 4.64 10/29/2021 1055   HGB 14.1 10/29/2021 1055   HCT 44.1 10/29/2021 1055   PLT 214 10/29/2021 1055   MCV 95.0 10/29/2021 1055   MCH 30.4 10/29/2021 1055   MCHC 32.0 10/29/2021 1055   RDW 13.2 10/29/2021 1055   LYMPHSABS 3.2 07/02/2021 1048   MONOABS 0.7 07/02/2021 1048   EOSABS 0.2 07/02/2021 1048   BASOSABS 0.1 07/02/2021 1048    Lithium Lvl  Date Value Ref Range Status  05/22/2021 0.7 0.6 - 1.2 mmol/L Final     No results found for: "PHENYTOIN", "PHENOBARB", "VALPROATE", "CBMZ"   .res Assessment: Plan:     Plan:  Therapist - Christina Hussami - [facility]  Lithium '300mg'$  daily  Trazadone '50mg'$  - 1 to 2 at bedtime Lorazepam '1mg'$  BID for anxiety - may take one tablet extra for severe anxiety symptoms. Taking one at night routinely. Lamictal '200mg'$  hs Lamictal '150mg'$  every morning.  Recent labs reviewed  Started NAC tablets for obsessive thoughts, worry, and rumination - unable to take SSRI's  RTC weekly  Counseled patient regarding potential benefits, risks, and side effects of Lamictal to include potential risk of Stevens-Johnson syndrome. Advised patient to stop taking Lamictal and contact office immediately if rash develops and to seek urgent medical attention if rash is severe and/or spreading quickly.  Discussed potential benefits, risk, and side effects of benzodiazepines to include potential risk of tolerance and dependence, as well as possible drowsiness.  Advised patient not to drive if experiencing drowsiness and to take lowest possible effective dose to minimize risk of dependence and tolerance.   Diagnoses and all orders for this visit:  Bipolar I disorder ([place])     Please see After Visit Summary for patient specific instructions.  Future Appointments  Date Time Provider [place]  11/14/2021 11:20 AM Iyana Topor, Berdie Ogren, NP [facility] None  11/27/2021  8:00 AM Carney Living, PT [facility]  12/10/2021 10:00 AM Inda Coke, PA [facility] P[facility]  12/20/2021  2:00 PM Hussami, Rachel Bo, LCSW [facility] None  12/31/2021 11:30 AM Shamleffer, Melanie Crazier, MD [facility] None  01/01/2022  9:45 AM Ruby Cola, RD [place] NDM  01/10/2022  2:00  PM Hussami, Christina R, LCSW [facility] None  01/29/2022  2:00 PM Hussami, Christina R, LCSW [facility] None  04/15/2022  1:45 PM [facility] HEALTH COACH [facility] P[facility]    No orders of the defined types were placed in this encounter.   -------------------------------

## 2021-11-08 ENCOUNTER — Encounter: Payer: Self-pay | Admitting: Internal Medicine

## 2021-11-09 ENCOUNTER — Telehealth: Payer: Self-pay | Admitting: Adult Health

## 2021-11-09 ENCOUNTER — Other Ambulatory Visit: Payer: Self-pay | Admitting: Adult Health

## 2021-11-09 DIAGNOSIS — F319 Bipolar disorder, unspecified: Secondary | ICD-10-CM

## 2021-11-09 MED ORDER — LITHIUM CARBONATE 150 MG PO CAPS: ORAL_CAPSULE | ORAL | 1 refills | Status: DC

## 2021-11-09 NOTE — Telephone Encounter (Signed)
Tiffany [name] brought in letter about a Rx yesterday. Placed in your in box. Husband called asking about that information. Contact Pt @ [phone]

## 2021-11-09 NOTE — Telephone Encounter (Signed)
Spoke with patient.

## 2021-11-14 ENCOUNTER — Encounter: Payer: Self-pay | Admitting: Adult Health

## 2021-11-14 ENCOUNTER — Ambulatory Visit (HOSPITAL_BASED_OUTPATIENT_CLINIC_OR_DEPARTMENT_OTHER): Payer: Medicare Other | Attending: Physician Assistant | Admitting: Physical Therapy

## 2021-11-14 ENCOUNTER — Ambulatory Visit (INDEPENDENT_AMBULATORY_CARE_PROVIDER_SITE_OTHER): Payer: Medicare Other | Admitting: Adult Health

## 2021-11-14 DIAGNOSIS — F411 Generalized anxiety disorder: Secondary | ICD-10-CM | POA: Diagnosis not present

## 2021-11-14 DIAGNOSIS — F4310 Post-traumatic stress disorder, unspecified: Secondary | ICD-10-CM

## 2021-11-14 DIAGNOSIS — G8929 Other chronic pain: Secondary | ICD-10-CM | POA: Insufficient documentation

## 2021-11-14 DIAGNOSIS — F5105 Insomnia due to other mental disorder: Secondary | ICD-10-CM

## 2021-11-14 DIAGNOSIS — M25561 Pain in right knee: Secondary | ICD-10-CM | POA: Insufficient documentation

## 2021-11-14 DIAGNOSIS — R2689 Other abnormalities of gait and mobility: Secondary | ICD-10-CM | POA: Diagnosis present

## 2021-11-14 DIAGNOSIS — F319 Bipolar disorder, unspecified: Secondary | ICD-10-CM

## 2021-11-14 DIAGNOSIS — M6281 Muscle weakness (generalized): Secondary | ICD-10-CM | POA: Insufficient documentation

## 2021-11-14 DIAGNOSIS — R2681 Unsteadiness on feet: Secondary | ICD-10-CM | POA: Diagnosis present

## 2021-11-14 DIAGNOSIS — F99 Mental disorder, not otherwise specified: Secondary | ICD-10-CM

## 2021-11-14 NOTE — Progress Notes (Signed)
Tiffany [name] [id number] January 25, 1951 71 y.o.  Subjective:   Patient ID:  Tiffany [name] is a 71 y.o. (DOB [birth year]) female.  Chief Complaint: No chief complaint on file.   HPI Londin Antone Colina presents to the office today for follow-up of PTSD, insomnia, GAD, BPD 1.  Accompanied by husband.  Describes mood today as "ok". Pleasant. Reports some tearfulness. Mood symptoms - reports some depression, anxiety and irritability. Reports some worry and rumination. Reports mood is lower. Stating "I'm doing ok". Has reduced Lithium dose to '150mg'$  over the past week with no change in mood - no mania. Seen by nephrology yesterday and advised of plan to taper off the Lithium. Seeing therapist in the morning at 9 am. Varying interest and motivation. Taking medications as prescribed. Energy levels vary. Active, does not have a regular exercise routine - knee pain. Enjoys some usual interests and activities. Married. Lives with husband their daughter. Talking to family and friends.  Appetite improved. Weight stable 194 pounds Sleeps better some nights than others. Averages 7 to 8 hours of broken sleep - up and down during the night going to the bathroom. Focus and concentration difficulties - "forgetful". Completing tasks. Managing some aspects of household. Retired.  Denies SI.  Denies HI.  Denies AH Denies VH  Denies self harm. Denies substance use.  Plans to see P/T tomorrow - knee   Previous medications: Celexa, Zyprexa, Tegretol, Depakote, Serzone, Topamax, Seroquel, Effexor, Lexapro, Desipramine, Neurontin, Abilify, Geodon, Propanolol, Cymbalta, Cogentin, Trihexyphenadyl, Sinmmet, Provigil, Selegiline, Requip, Amantadine, Prozac, Mirapex, Azilect, Metoclopramide, Baclofen, Artane, Namenda, Latuda -SOB and heart palpitations.   Tiffany [name] Office Visit from 02/16/2021 in [place] Visit from 05/14/2019 in [place]  Total GAD-7 Score  13 [place] Visit from 06/07/2019 in [facility]  Total Score (max 30 points ) 29      PHQ2-9    Flowsheet Row Nutrition from 11/06/2021 [facility] Office Visit from 07/30/2021 in [place] Visit from 07/02/2021 in [facility] from 04/02/2021 in [facility] [place] Visit from 02/16/2021 in [facility]  PHQ-2 Total Score 0 '2 1 1 3  '$ PHQ-9 Total Score -- '11 6 1 6      '$ Flowsheet Row Clinical Support from 04/02/2021 in [facility] from 03/21/2021 in [facility] ED from 02/03/2021 in [facility] No Risk No Risk No Risk        Review of Systems:  Review of Systems  Musculoskeletal:  Negative for gait problem.  Neurological:  Negative for tremors.  Psychiatric/Behavioral:         Please refer to HPI    Medications: I have reviewed the patient's current medications.  Current Outpatient Medications  Medication Sig Dispense Refill   aMILoride (MIDAMOR) 5 MG tablet Take 5 mg by mouth every morning.     augmented betamethasone dipropionate (DIPROLENE-AF) 0.05 % cream Apply topically.     Azelaic Acid 15 % gel Apply topically as needed.     B Complex Vitamins (VITAMIN B COMPLEX) TABS 1 tablet Orally Once daily     benzonatate (TESSALON) 100 MG capsule 1 capsule as needed Orally Three times a day     cholecalciferol (VITAMIN D3) 25 MCG (1000 UNIT) tablet  Take 1,000 Units by mouth daily.     Cholecalciferol 50 MCG (2000 UT) TABS 1 tablet Orally Once a day for 30 day(s)     Golimumab (SIMPONI ARIA IV)      lamoTRIgine (LAMICTAL) 100 MG tablet Take one tablet in the morning and take one tablet at lunch. 180 tablet 1   lamoTRIgine (LAMICTAL) 200 MG tablet Take 1 tablet (200 mg total) by mouth  at bedtime. 90 tablet 3   levothyroxine (SYNTHROID) 100 MCG tablet Take 100 mcg by mouth. Takes 100 mg Saturday and Sunday     levothyroxine (SYNTHROID) 112 MCG tablet Take 112 mcg by mouth. Takes days Monday through Friday.     lithium carbonate 150 MG capsule TAKE 1 CAPSULE(150 MG) BY MOUTH AT BEDTIME. 90 capsule 1   LORazepam (ATIVAN) 1 MG tablet TAKE 1 TABLET(1 MG) BY MOUTH TWICE DAILY AS NEEDED 60 tablet 2   metroNIDAZOLE (METROGEL) 0.75 % gel Apply to face 1-2 times daily 45 g 0   Omega 3 340 MG CPDR 1 capsule Orally Once a day for 30 day(s)     sodium bicarbonate 650 MG tablet 1 tablet Orally Once daily     traZODone (DESYREL) 50 MG tablet Take one to two tablets at bedtime. 90 tablet 3   No current facility-administered medications for this visit.    Medication Side Effects: None  Allergies:  Allergies  Allergen Reactions   Aripiprazole Other (See Comments)    Parkinsonism      Lactose Intolerance (Gi) Diarrhea   Methotrexate Other (See Comments)    Hair loss, severe stomatitis     Cefdinir Diarrhea    Other reaction(s): Diarrhea Yeast infection and fever; negative c diff   Etanercept Other (See Comments)    Headaches     Exemestane Other (See Comments)    Suicidal thoughts with medication     Fluoxetine Other (See Comments)    Parkinsonism   Lactose     Other reaction(s): diarrhea Other reaction(s): diarrhea Other reaction(s): diarrhea   Methylprednisolone Sodium Succ Other (See Comments)    Agitated mania   Epinephrine Palpitations    tachycardia    Nitrofurantoin Nausea And Vomiting and Rash      Other reaction(s): rash, diarrhea Other reaction(s): rash, diarrhea Other reaction(s): rash, diarrhea Other reaction(s): rash, diarrhea Other reaction(s): rash, diarrhea Other reaction(s): rash, diarrhea    Past Medical History:  Diagnosis Date   Bipolar 1 disorder (HCC)    Breast cancer (HCC)    Chronic diarrhea    loose stools twice a day on  average for years   Chronic kidney disease (CKD), stage III (moderate) ([place])    Depression 1987   Family history of breast cancer    Hospitalization or health care facility admission within last 6 months 04/2019   for fall/seizures   Malignant neoplasm of overlapping sites of left breast in female, estrogen receptor positive ([place]) 03/14/2016   Dx in 09/2014, s/p bilateral mastectomies and ALND, 0/10 LN. 1.4 cm  Grade I invasive lobular, ER and PR +/Her--, Ki 67 <5% Tried anastrozole for one month, but developed suicidal idea   Osteoarthritis, knee 09/24/2019   Xray 09/2019   Parkinson's disease ([place]) 2012   Personal history of malignant neoplasm of breast    Polyposis of colon    Psoriatic arthritis ([place])    PTSD (post-traumatic stress disorder)    Secondary hyperparathyroidism ([place])    Tardive dyskinesia    Thyroid disease  Past Medical History, Surgical history, Social history, and Family history were reviewed and updated as appropriate.   Please see review of systems for further details on the patient's review from today.   Objective:   Physical Exam:  There were no vitals taken for this visit.  Physical Exam Constitutional:      General: She is not in acute distress. Musculoskeletal:        General: No deformity.  Neurological:     Mental Status: She is alert and oriented to person, place, and time.     Coordination: Coordination normal.  Psychiatric:        Attention and Perception: Attention and perception normal. She does not perceive auditory or visual hallucinations.        Mood and Affect: Mood normal. Mood is not anxious or depressed. Affect is not labile, blunt, angry or inappropriate.        Speech: Speech normal.        Behavior: Behavior normal.        Thought Content: Thought content normal. Thought content is not paranoid or delusional. Thought content does not include homicidal or suicidal ideation. Thought content does not include homicidal or suicidal  plan.        Cognition and Memory: Cognition and memory normal.        Judgment: Judgment normal.     Comments: Insight intact     Lab Review:     Component Value Date/Time   NA 137 10/29/2021 1055   NA 142 10/17/2021 0000   K 5.1 10/29/2021 1055   CL 110 10/29/2021 1055   CO2 17 (L) 10/29/2021 1055   GLUCOSE 91 10/29/2021 1055   BUN 35 (H) 10/29/2021 1055   BUN 30 (A) 10/17/2021 0000   CREATININE 2.47 (H) 10/29/2021 1055   CREATININE 2.29 (H) 05/22/2021 1044   CALCIUM 9.7 10/29/2021 1055   PROT 7.6 07/02/2021 1048   ALBUMIN 4.8 10/17/2021 0000   AST 13 07/02/2021 1048   ALT 14 07/02/2021 1048   ALKPHOS 125 (H) 07/02/2021 1048   BILITOT 1.2 07/02/2021 1048   GFRNONAA 20 (L) 10/29/2021 1055   GFRAA 25 (L) 09/13/2019 1243       Component Value Date/Time   WBC 9.5 10/29/2021 1055   RBC 4.64 10/29/2021 1055   HGB 14.1 10/29/2021 1055   HCT 44.1 10/29/2021 1055   PLT 214 10/29/2021 1055   MCV 95.0 10/29/2021 1055   MCH 30.4 10/29/2021 1055   MCHC 32.0 10/29/2021 1055   RDW 13.2 10/29/2021 1055   LYMPHSABS 3.2 07/02/2021 1048   MONOABS 0.7 07/02/2021 1048   EOSABS 0.2 07/02/2021 1048   BASOSABS 0.1 07/02/2021 1048    Lithium Lvl  Date Value Ref Range Status  05/22/2021 0.7 0.6 - 1.2 mmol/L Final     No results found for: "PHENYTOIN", "PHENOBARB", "VALPROATE", "CBMZ"   .res Assessment: Plan:    Plan:  Therapist - Christina Hussami - [facility]  Lithium '150mg'$  daily from '300mg'$  daily. Trazadone '50mg'$  - 1 to 2 at bedtime Lorazepam '1mg'$  BID for anxiety - may take one tablet extra for severe anxiety symptoms. Taking one at night routinely. Lamictal '200mg'$  hs Lamictal '150mg'$  every morning.  Recent labs reviewed  NAC tablets BID - for obsessive thoughts, worry, and rumination - unable to take SSRI's  RTC weekly  Counseled patient regarding potential benefits, risks, and side effects of Lamictal to include potential risk of Stevens-Johnson syndrome. Advised patient  to stop taking Lamictal  and contact office immediately if rash develops and to seek urgent medical attention if rash is severe and/or spreading quickly.  Discussed potential benefits, risk, and side effects of benzodiazepines to include potential risk of tolerance and dependence, as well as possible drowsiness. Advised patient not to drive if experiencing drowsiness and to take lowest possible effective dose to minimize risk of dependence and tolerance.   Diagnoses and all orders for this visit:  Bipolar I disorder ([place])  Generalized anxiety disorder  PTSD (post-traumatic stress disorder)  Insomnia due to other mental disorder     Please see After Visit Summary for patient specific instructions.  Future Appointments  Date Time Provider [place]  11/14/2021  4:00 PM Carney Living, PT [facility]  11/27/2021  8:00 AM Carney Living, PT [facility]  12/10/2021 10:00 AM Inda Coke, PA [facility]  12/20/2021  2:00 PM Hussami, Christina R, LCSW [facility] None  12/31/2021  8:45 AM Valora Piccolo Barnabas Lister, RD [place] NDM  12/31/2021 11:30 AM Shamleffer, Melanie Crazier, MD [facility] None  01/10/2022  2:00 PM Hussami, Christina R, LCSW [facility] None  01/29/2022  2:00 PM Hussami, Christina R, LCSW [facility] None  04/15/2022  1:45 PM [facility] HEALTH COACH [facility]    No orders of the defined types were placed in this encounter.   -------------------------------

## 2021-11-15 ENCOUNTER — Encounter (HOSPITAL_BASED_OUTPATIENT_CLINIC_OR_DEPARTMENT_OTHER): Payer: Self-pay | Admitting: Physical Therapy

## 2021-11-15 NOTE — Therapy (Signed)
OUTPATIENT PHYSICAL THERAPY LOWER EXTREMITY TREATMENT/Progress Note    Patient Name: Tiffany [name] MRN: [id number] DOB:Feb 08, 1951, 71 y.o., female Today's Date: 11/15/2021   PT End of Session - 11/15/21 0918     Visit Number 7    Number of Visits 13    Date for PT Re-Evaluation 12/27/21    PT Start Time 1600    PT Stop Time 1644    PT Time Calculation (min) 44 min    Activity Tolerance Patient tolerated treatment well    Behavior During Therapy [facility] for tasks assessed/performed            Progress Note Reporting Period 11/14/2021 to 12/27/2021  See note below for Objective Data and Assessment of Progress/Goals.       Past Medical History:  Diagnosis Date   Bipolar 1 disorder ([place])    Breast cancer ([place])    Chronic diarrhea    loose stools twice a day on average for years   Chronic kidney disease (CKD), stage III (moderate) ([place])    Depression 1987   Family history of breast cancer    Hospitalization or health care facility admission within last 6 months 04/2019   for fall/seizures   Malignant neoplasm of overlapping sites of left breast in female, estrogen receptor positive ([place]) 03/14/2016   Dx in 09/2014, s/p bilateral mastectomies and ALND, 0/10 LN. 1.4 cm  Grade I invasive lobular, ER and PR +/Her--, Ki 67 <5% Tried anastrozole for one month, but developed suicidal idea   Osteoarthritis, knee 09/24/2019   Xray 09/2019   Parkinson's disease ([place]) 2012   Personal history of malignant neoplasm of breast    Polyposis of colon    Psoriatic arthritis ([place])    PTSD (post-traumatic stress disorder)    Secondary hyperparathyroidism ([place])    Tardive dyskinesia    Thyroid disease    Past Surgical History:  Procedure Laterality Date   [place] Bilateral 09/19/2014   [place] Bilateral 1974   Patient Active Problem List   Diagnosis Date  Noted   Genetic testing 06/14/2021   Family history of breast cancer 06/05/2021   Osteoarthritis, knee 09/24/2019   CKD (chronic kidney disease) stage 4, GFR 15-29 ml/min ([place]) 09/24/2019   Severe recurrent major depression without psychotic features ([place]) 09/09/2019   Bipolar I disorder, most recent episode depressed ([place])    Transaminitis    Essential hypertension    Encephalopathy 04/26/2019   Leukocytosis 04/21/2019   Thrombocytopenia ([place]) 09/04/2018   Vitamin D deficiency 09/04/2018   Psoriatic arthritis ([place]) 09/04/2018   Tubular adenoma of colon 05/28/2017   History of colonic polyps 05/28/2017   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 09/12/2016   Malignant neoplasm of overlapping sites of left breast in female, estrogen receptor positive ([place]) 03/14/2016   Tardive dyskinesia 10/18/2015   History of breast cancer left 2016 12/27/2014   Osteopenia determined by x-ray 10/31/2014   Rosacea 05/21/2007   Bipolar affective disorder, mixed ([place]) 08/16/2004   Acquired hypothyroidism 04/03/1996    PCP: Inda Coke, PA  REFERRING PROVIDER: Inda Coke, PA  REFERRING DIAG:  [phone] - recurrent pain of right knee  THERAPY DIAG:  Other abnormalities of gait and mobility  Unsteadiness on feet  Muscle weakness (generalized)  Chronic pain of right knee  Rationale for Evaluation and Treatment Rehabilitation  ONSET  DATE: 3 weeks ago  SUBJECTIVE:   SUBJECTIVE STATEMENT: On August 17th the patient was having a personal training session and feels like she was pushed to hard with the leg press. She had increased pain in her knee 2 days after which has continued. She feels the most pain at night. She has bought a wheelchair and started using it for distances outside of the house. During the day it is not too bad but her pain gets to a high level outside of the house.   PERTINENT HISTORY: Bi-polar, CKD, depression, breast cancer, OA,  Parkinson's, Psoriatic Arthritis, PTSD, tardive diskenesia,   PAIN:  Are you having pain? Yes: NPRS scale: 6/10 Pain location: right lateral knee Pain description: aching Aggravating factors: standing, walking , twisting Relieving factors: rest  PRECAUTIONS: None  WEIGHT BEARING RESTRICTIONS No  FALLS:  Has patient fallen in last 6 months? No  LIVING ENVIRONMENT: Lives with husband  OCCUPATION: retired   PLOF: Independent uses an assitvie device at times  PATIENT GOALS   To have less pain in her knee and to get back into the gym   OBJECTIVE:   DIAGNOSTIC FINDINGS:   PATIENT SURVEYS:   COGNITION:  Overall cognitive status: Within functional limits for tasks assessed     SENSATION: WFL   POSTURE: rounded shoulders and forward head  PALPATION: Trigger points in lower IT band   LOWER EXTREMITY ROM:  Active PROM Right eval Left eval Right  9/6   Hip flexion      Hip extension      Hip abduction      Hip adduction      Hip internal rotation      Hip external rotation      Knee flexion 94  106   Knee extension -13  -8   Ankle dorsiflexion      Ankle plantarflexion      Ankle inversion      Ankle eversion       (Blank rows = not tested)  LOWER EXTREMITY MMT:  MMT Right eval Left eval  Hip flexion 16.1 23.1  Hip extension    Hip abduction    Hip adduction 29.7 27.3  Hip internal rotation    Hip external rotation    Knee flexion    Knee extension 25.6 33  Ankle dorsiflexion    Ankle plantarflexion    Ankle inversion    Ankle eversion     (Blank rows = not tested)   GAIT: Decreased single leg stance time on the right    TODAY'S TREATMENT: 9/7 Quad sets 3x10  SLR 2x10   Standing weight shifts x20   Manual: Patella mobilization; Extension stretching; PA mobilization/ AP soft tissue mobilization to lateral and posterior knee   Ambulation 200' with walker without increased pain   Reviewed tests and measures with the patient. She had  no acute pain. Reviewed goal.    8/8 Manual: PROM into extension; trigger point release to posterior knee  SAQ x20  SLR 2x10  Row machine 2x15  15 lbs  Chest press 2x10  Leg press 2x15 25 lbs  Reviewed the use of a few other machines can use.   7/24 Nu-step warm up 5 min L2  Manual: Knee extension stretch Lat pull down from cable 2x10 10lbs  Tricpes extension 2x10 5 lbs min cuing for technique  Reviewed 2 different lat pull down machines. She did better with the lifestyles machine. The seat on the cybex is too low.  2x10 10lbs each  Quad set 2x10  SLR 2x6        PATIENT EDUCATION:  Education details: reviewed HEP and gym equipment.  Person educated: Patient Education method: Explanation, Demonstration, Tactile cues, Verbal cues, and Handouts Education comprehension: verbalized understanding, returned demonstration, verbal cues required, tactile cues required, and needs further education   HOME EXERCISE PROGRAM: Has past HEP   ASSESSMENT:  CLINICAL IMPRESSION:  Therapy re-assessed the patients knee. We could not reproduce the high levels of pain that the patient has been experiencing at night. We gave her some exercises to start to work on. Since her incident she has not been doing as much. She reported no increase in pain with her treatment today. She was given 3 exercises to work on at home. She was advised if what we did today causes her significant pain then she should be re-assessed by her primary MD. Therapy contacted primary who agrees with plan. We will continue 1W6.    OBJECTIVE IMPAIRMENTS Abnormal gait, decreased activity tolerance, decreased endurance, decreased mobility, difficulty walking, decreased ROM, decreased strength, increased fascial restrictions, and pain.   ACTIVITY LIMITATIONS carrying, bending, standing, squatting, stairs, transfers, and locomotion level  PARTICIPATION LIMITATIONS: cleaning, laundry, shopping, community activity, and yard  work  PERSONAL FACTORS 3+ comorbidities: Psoriatic arthritis, Bi-polar parkinsosn  are also affecting patient's functional outcome.   REHAB POTENTIAL: Good  CLINICAL DECISION MAKING: Evolving/moderate complexity  EVALUATION COMPLEXITY: Moderate   GOALS: Goals reviewed with patient? Yes  SHORT TERM GOALS: Target date: 09/09/2021 reviewed on 6/28  Patient will increase gross right LE strength by 5 lbs  Baseline: Goal status: ]not tested today   2.  Patient will be increase knee extension passive by 5 degrees  Baseline:  Goal status: Achieved improved to 08 degrees today   3.  Patient will be independent with basic HEP  Baseline:  Goal status: independent with basic HEP. Advancing back to gym program   LONG TERM GOALS: Target date: 10/11/2021   Patient will stand for 20 min without pain in order to perform her ADL's  Baseline:  Goal status: INITIAL  2.  Patient will be independent with full gym program  Baseline:  Goal status: INITIAL  3.  Patient will ambulate without increased pain  Baseline:  Goal status: INITIAL    PLAN: PT FREQUENCY: 2x/week  PT DURATION: 8 weeks  PLANNED INTERVENTIONS: Therapeutic exercises, Therapeutic activity, Neuromuscular re-education, Balance training, Gait training, Patient/Family education, Joint mobilization, Stair training, DME instructions, Aquatic Therapy, Dry Needling, Electrical stimulation, Cryotherapy, Moist heat, Ultrasound, and Manual therapy  PLAN FOR NEXT SESSION: Continue manual as indicated, progress to resistive standing exercise, balance/SLS as tolerated   Carney Living, PT 11/15/2021, 9:30 AM

## 2021-11-21 ENCOUNTER — Telehealth: Payer: Self-pay

## 2021-11-21 ENCOUNTER — Ambulatory Visit (INDEPENDENT_AMBULATORY_CARE_PROVIDER_SITE_OTHER): Payer: Medicare Other | Admitting: Adult Health

## 2021-11-21 ENCOUNTER — Encounter: Payer: Self-pay | Admitting: Adult Health

## 2021-11-21 ENCOUNTER — Ambulatory Visit (HOSPITAL_BASED_OUTPATIENT_CLINIC_OR_DEPARTMENT_OTHER): Payer: Medicare Other | Admitting: Physical Therapy

## 2021-11-21 ENCOUNTER — Ambulatory Visit: Payer: Medicare Other | Admitting: Psychiatry

## 2021-11-21 DIAGNOSIS — F5105 Insomnia due to other mental disorder: Secondary | ICD-10-CM

## 2021-11-21 DIAGNOSIS — R2689 Other abnormalities of gait and mobility: Secondary | ICD-10-CM | POA: Diagnosis not present

## 2021-11-21 DIAGNOSIS — F4310 Post-traumatic stress disorder, unspecified: Secondary | ICD-10-CM | POA: Diagnosis not present

## 2021-11-21 DIAGNOSIS — F319 Bipolar disorder, unspecified: Secondary | ICD-10-CM

## 2021-11-21 DIAGNOSIS — F99 Mental disorder, not otherwise specified: Secondary | ICD-10-CM

## 2021-11-21 DIAGNOSIS — M6281 Muscle weakness (generalized): Secondary | ICD-10-CM

## 2021-11-21 DIAGNOSIS — R2681 Unsteadiness on feet: Secondary | ICD-10-CM

## 2021-11-21 DIAGNOSIS — F411 Generalized anxiety disorder: Secondary | ICD-10-CM | POA: Diagnosis not present

## 2021-11-21 DIAGNOSIS — G8929 Other chronic pain: Secondary | ICD-10-CM

## 2021-11-21 NOTE — Progress Notes (Signed)
Tiffany [name] [id number] Oct 27, 1950 71 y.o.  Subjective:   Patient ID:  Tiffany [name] is a 70 y.o. (DOB 09-28-1950) female.  Chief Complaint: No chief complaint on file.   HPI Tiffany [name] presents to the office today for follow-up of PTSD, insomnia, GAD, BPD 1.  Accompanied by husband.  Describes mood today as "ok". Pleasant. Reports some tearfulness. Mood symptoms - reports some depression, anxiety and irritability. Reports some worry and rumination. Reports mood is variable. Stating "I'm doing ok today". Has reduced Lithium dose to 120m  - 2 weeks - reports one hypo manic episode over the past week. Met with SEarlie Server- therapsit - feels positive about it .  Followed by nephrology. Varying interest and motivation. Taking medications as prescribed Energy levels stable. Active, does not have a regular exercise routine with physical limitations Enjoys some usual interests and activities. Married. Lives with husband their daughter. Talking to family and friends.  Appetite improved. Weight loss 190 from 194 pounds Sleeps better some nights than others. Averages 7 to 8 hours of broken sleep - up and down during the night going to the bathroom. Focus and concentration difficulties - feels "forgetful" at times - comes and guys. Completing tasks. Managing some aspects of household. Retired.  Denies SI.  Denies HI.  Denies AH Denies VH  Denies self harm. Denies substance use.  Working with P/T   Previous medications: Celexa, Zyprexa, Tegretol, Depakote, Serzone, Topamax, Seroquel, Effexor, Lexapro, Desipramine, Neurontin, Abilify, Geodon, Propanolol, Cymbalta, Cogentin, Trihexyphenadyl, Sinmmet, Provigil, Selegiline, Requip, Amantadine, Prozac, Mirapex, Azilect, Metoclopramide, Baclofen, Artane, Namenda, Latuda -SOB and heart palpitations.   GDana PointOffice Visit from 02/16/2021 [place]/14/2019 in [place] Total GAD-7 Score 13 2[place] 06/07/2019 [facility] [place] Total Score (max 30 points ) 29      PHQ2-9    Flowsheet Row Nutrition from 11/06/2021 in [facility] Office Visit from 07/30/2021 in [place] from 07/02/2021 in [facility] [facility]2021 in [facility] from 02/16/2021 in [facility] PHQ-2 Total Score 0 _0 PHQ-9 Total Score -- _1 Flowsheet Row Clinical Support from 04/02/2021 in [facility]ED from 03/21/2021 [facility] from 02/03/2021 in [facility]artNo Risk No Risk No Risk        Review of Systems:  Review of Systems  Musculoskeletal:  Negative for gait problem.  Neurological:  Negative for tremors.  Psychiatric/Behavioral:         Please refer to HPI    Medications: I have reviewed the patient's current medications.  Current Outpatient Medications  Medication Sig Dispense Refill   aMILoride (MIDAMOR) 5 MG tablet Take 5 mg by mouth every morning.     augmented betamethasone dipropionate (DIPROLENE-AF) 0.05 % cream Apply topically.     Azelaic Acid 15 % gel Apply topically as needed.     B Complex Vitamins (VITAMIN B COMPLEX) TABS 1 tablet Orally Once daily     benzonatate (TESSALON) 100 MG capsule 1 capsule as needed Orally Three times a day     cholecalciferol (VITAMIN D3) 25 MCG (  1000 UNIT) tablet Take 1,000 Units by mouth daily.     Cholecalciferol 50 MCG (2000 UT) TABS 1 tablet Orally Once a day for 30 day(s)     Golimumab (SIMPONI ARIA IV)      lamoTRIgine (LAMICTAL) 100 MG tablet Take one tablet in the morning and take one tablet at lunch. 180 tablet 1   lamoTRIgine (LAMICTAL) 200 MG tablet Take 1  tablet (200 mg total) by mouth at bedtime. 90 tablet 3   levothyroxine (SYNTHROID) 100 MCG tablet Take 100 mcg by mouth. Takes 100 mg Saturday and Sunday     levothyroxine (SYNTHROID) 112 MCG tablet Take 112 mcg by mouth. Takes days Monday through Friday.     lithium carbonate 150 MG capsule TAKE 1 CAPSULE(150 MG) BY MOUTH AT BEDTIME. 90 capsule 1   LORazepam (ATIVAN) 1 MG tablet TAKE 1 TABLET(1 MG) BY MOUTH TWICE DAILY AS NEEDED 60 tablet 2   metroNIDAZOLE (METROGEL) 0.75 % gel Apply to face 1-2 times daily 45 g 0   Omega 3 340 MG CPDR 1 capsule Orally Once a day for 30 day(s)     sodium bicarbonate 650 MG tablet 1 tablet Orally Once daily     traZODone (DESYREL) 50 MG tablet Take one to two tablets at bedtime. 90 tablet 3   No current facility-administered medications for this visit.    Medication Side Effects: None  Allergies:  Allergies  Allergen Reactions   Aripiprazole Other (See Comments)    Parkinsonism      Lactose Intolerance (Gi) Diarrhea   Methotrexate Other (See Comments)    Hair loss, severe stomatitis     Cefdinir Diarrhea    Other reaction(s): Diarrhea Yeast infection and fever; negative c diff   Etanercept Other (See Comments)    Headaches     Exemestane Other (See Comments)    Suicidal thoughts with medication     Fluoxetine Other (See Comments)    Parkinsonism   Lactose     Other reaction(s): diarrhea Other reaction(s): diarrhea Other reaction(s): diarrhea   Methylprednisolone Sodium Succ Other (See Comments)    Agitated mania   Epinephrine Palpitations    tachycardia    Nitrofurantoin Nausea And Vomiting and Rash      Other reaction(s): rash, diarrhea Other reaction(s): rash, diarrhea Other reaction(s): rash, diarrhea Other reaction(s): rash, diarrhea Other reaction(s): rash, diarrhea Other reaction(s): rash, diarrhea    Past Medical History:  Diagnosis Date   Bipolar 1 disorder ([facility])    Breast cancer ([facility])    Chronic diarrhea     loose stools twice a day on average for years   Chronic kidney disease (CKD), stage III (moderate) ([place])    Depression 1987   Family history of breast cancer    Hospitalization or health care facility admission within last 6 months 04/2019   for fall/seizures   Malignant neoplasm of overlapping sites of left breast in female, estrogen receptor positive ([place]) 03/14/2016   Dx in 09/2014, s/p bilateral mastectomies and ALND, 0/10 LN. 1.4 cm  Grade I invasive lobular, ER and PR +/Her--, Ki 67 <5% Tried anastrozole for one month, but developed suicidal idea   Osteoarthritis, knee 09/24/2019   Xray 09/2019   Parkinson's disease ([place]) 2012   Personal history of malignant neoplasm of breast    Polyposis of colon    Psoriatic arthritis ([place])    PTSD (post-traumatic stress disorder)    Secondary hyperparathyroidism ([place])    Tardive dyskinesia    Thyroid disease  Past Medical History, Surgical history, Social history, and Family history were reviewed and updated as appropriate.   Please see review of systems for further details on the patient's review from today.   Objective:   Physical Exam:  There were no vitals taken for this visit.  Physical Exam Constitutional:      General: She is not in acute distress. Musculoskeletal:        General: No deformity.  Neurological:     Mental Status: She is alert and oriented to person, place, and time.     Coordination: Coordination normal.  Psychiatric:        Attention and Perception: Attention and perception normal. She does not perceive auditory or visual hallucinations.        Mood and Affect: Mood normal. Mood is not anxious or depressed. Affect is not labile, blunt, angry or inappropriate.        Speech: Speech normal.        Behavior: Behavior normal.        Thought Content: Thought content normal. Thought content is not paranoid or delusional. Thought content does not include homicidal or suicidal ideation. Thought content does not  include homicidal or suicidal plan.        Cognition and Memory: Cognition and memory normal.        Judgment: Judgment normal.     Comments: Insight intact     Lab Review:     Component Value Date/Time   NA 137 10/29/2021 1055   NA 142 10/17/2021 0000   K 5.1 10/29/2021 1055   CL 110 10/29/2021 1055   CO2 17 (L) 10/29/2021 1055   GLUCOSE 91 10/29/2021 1055   BUN 35 (H) 10/29/2021 1055   BUN 30 (A) 10/17/2021 0000   CREATININE 2.47 (H) 10/29/2021 1055   CREATININE 2.29 (H) 05/22/2021 1044   CALCIUM 9.7 10/29/2021 1055   PROT 7.6 07/02/2021 1048   ALBUMIN 4.8 10/17/2021 0000   AST 13 07/02/2021 1048   ALT 14 07/02/2021 1048   ALKPHOS 125 (H) 07/02/2021 1048   BILITOT 1.2 07/02/2021 1048   GFRNONAA 20 (L) 10/29/2021 1055   GFRAA 25 (L) 09/13/2019 1243       Component Value Date/Time   WBC 9.5 10/29/2021 1055   RBC 4.64 10/29/2021 1055   HGB 14.1 10/29/2021 1055   HCT 44.1 10/29/2021 1055   PLT 214 10/29/2021 1055   MCV 95.0 10/29/2021 1055   MCH 30.4 10/29/2021 1055   MCHC 32.0 10/29/2021 1055   RDW 13.2 10/29/2021 1055   LYMPHSABS 3.2 07/02/2021 1048   MONOABS 0.7 07/02/2021 1048   EOSABS 0.2 07/02/2021 1048   BASOSABS 0.1 07/02/2021 1048    Lithium Lvl  Date Value Ref Range Status  05/22/2021 0.7 0.6 - 1.2 mmol/L Final     No results found for: "PHENYTOIN", "PHENOBARB", "VALPROATE", "CBMZ"   .res Assessment: Plan:    Plan:  Therapist - Christina Hussami - [facility]  D/C Lithium 132m daily   Trazadone 544m- 1 to 2 at bedtime Lorazepam 26m43mID for anxiety - may take one tablet extra for severe anxiety symptoms. Taking one at night routinely. Lamictal 200m57m Lamictal 150mg37mry morning.  Recent labs reviewed  NAC tablets BID - for obsessive thoughts, worry, and rumination - unable to take SSRI's  RTC weekly  Counseled patient regarding potential benefits, risks, and side effects of Lamictal to include potential risk of Stevens-Johnson  syndrome. Advised patient to stop taking Lamictal  and contact office immediately if rash develops and to seek urgent medical attention if rash is severe and/or spreading quickly.  Discussed potential benefits, risk, and side effects of benzodiazepines to include potential risk of tolerance and dependence, as well as possible drowsiness. Advised patient not to drive if experiencing drowsiness and to take lowest possible effective dose to minimize risk of dependence and tolerance.   Diagnoses and all orders for this visit:  Bipolar I disorder ([place])  Generalized anxiety disorder  PTSD (post-traumatic stress disorder)  Insomnia due to other mental disorder     Please see After Visit Summary for patient specific instructions.  Future Appointments  Date Time Provider [place]  11/21/2021  3:15 PM Carney Living, PT [facility]  11/27/2021  8:00 AM Carney Living, PT [facility]  12/03/2021  2:40 PM Willys Salvino, Berdie Ogren, NP [facility] None  12/10/2021 10:00 AM Inda Coke, PA [facility]  12/20/2021  2:00 PM Hussami, Rachel Bo, LCSW [facility] None  12/31/2021  8:45 AM Valora Piccolo Barnabas Lister, RD [place] [facility]DM  12/31/2021 11:30 AM Shamleffer, Melanie Crazier, MD [facility] None  01/10/2022  2:00 PM Hussami, Christina R, LCSW [facility] None  01/29/2022  2:00 PM Hussami, Christina R, LCSW [facility] None  04/15/2022  1:45 PM [facility] HEALTH COACH [facility]    No orders of the defined types were placed in this encounter.   -------------------------------

## 2021-11-21 NOTE — Therapy (Signed)
OUTPATIENT PHYSICAL THERAPY LOWER EXTREMITY TREATMENT/Progress Note    Patient Name: Tiffany [name] MRN: [id number] DOB:07-16-1950, 71 y.o., female Today's Date: 11/21/2021    Progress Note Reporting Period 11/14/2021 to 12/27/2021  See note below for Objective Data and Assessment of Progress/Goals.       Past Medical History:  Diagnosis Date   Bipolar 1 disorder ([place])    Breast cancer ([place])    Chronic diarrhea    loose stools twice a day on average for years   Chronic kidney disease (CKD), stage III (moderate) ([place])    Depression 1987   Family history of breast cancer    Hospitalization or health care facility admission within last 6 months 04/2019   for fall/seizures   Malignant neoplasm of overlapping sites of left breast in female, estrogen receptor positive ([place]) 03/14/2016   Dx in 09/2014, s/p bilateral mastectomies and ALND, 0/10 LN. 1.4 cm  Grade I invasive lobular, ER and PR +/Her--, Ki 67 <5% Tried anastrozole for one month, but developed suicidal idea   Osteoarthritis, knee 09/24/2019   Xray 09/2019   Parkinson's disease ([place]) 2012   Personal history of malignant neoplasm of breast    Polyposis of colon    Psoriatic arthritis ([place])    PTSD (post-traumatic stress disorder)    Secondary hyperparathyroidism ([place])    Tardive dyskinesia    Thyroid disease    Past Surgical History:  Procedure Laterality Date   [place] Bilateral 09/19/2014   [place] Bilateral 1974   Patient Active Problem List   Diagnosis Date Noted   Genetic testing 06/14/2021   Family history of breast cancer 06/05/2021   Osteoarthritis, knee 09/24/2019   CKD (chronic kidney disease) stage 4, GFR 15-29 ml/min ([place]) 09/24/2019   Severe recurrent major depression without psychotic features ([place]) 09/09/2019   Bipolar I disorder, most recent episode depressed ([place])     Transaminitis    Essential hypertension    Encephalopathy 04/26/2019   Leukocytosis 04/21/2019   Thrombocytopenia ([place]) 09/04/2018   Vitamin D deficiency 09/04/2018   Psoriatic arthritis ([place]) 09/04/2018   Tubular adenoma of colon 05/28/2017   History of colonic polyps 05/28/2017   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  Rationale for Evaluation and Treatment Rehabilitation  ONSET DATE: 3 weeks ago  SUBJECTIVE:   SUBJECTIVE STATEMENT: The patient reports her knee feels much better today. She has been working on light exercises and movement.  PERTINENT HISTORY: Bi-polar, CKD, depression, breast cancer, OA, Parkinson's, Psoriatic Arthritis, PTSD, tardive diskenesia,   PAIN:  Are you having pain? Yes: NPRS scale: 6/10 Pain location: right lateral knee Pain description: aching Aggravating factors: standing, walking , twisting Relieving factors: rest  PRECAUTIONS: None  WEIGHT BEARING RESTRICTIONS No  FALLS:  Has patient fallen in last 6 months? No  LIVING ENVIRONMENT: Lives with husband  OCCUPATION: retired   PLOF: Independent uses an assitvie device at times  PATIENT GOALS   To have less pain in her knee and to get back into the gym   OBJECTIVE:   DIAGNOSTIC FINDINGS:   PATIENT SURVEYS:   COGNITION:  Overall cognitive status: Within functional limits for tasks assessed     SENSATION: WFL   POSTURE:  rounded shoulders and forward head  PALPATION: Trigger points in lower IT band   LOWER EXTREMITY ROM:  Active PROM Right eval Left eval Right  9/6   Hip flexion      Hip extension      Hip abduction      Hip adduction      Hip internal rotation      Hip external rotation      Knee flexion 94  106   Knee extension -13  -8   Ankle dorsiflexion      Ankle plantarflexion      Ankle inversion      Ankle eversion       (Blank rows = not tested)  LOWER EXTREMITY MMT:  MMT Right eval Left eval  Hip flexion 16.1 23.1  Hip extension    Hip abduction    Hip adduction 29.7 27.3  Hip internal rotation    Hip external rotation    Knee flexion    Knee extension 25.6 33  Ankle dorsiflexion    Ankle plantarflexion    Ankle inversion    Ankle eversion     (Blank rows = not tested)   GAIT: Decreased single leg stance time on the right    TODAY'S TREATMENT: 9/13 Quad sets 3x10  SLR 2x10 bilateral  SAQ 2x15 bilateral   Supine hip abduction green 2x15   Standing weight shifts x20  Standing slow march x20   Manual: Patella mobilization; Extension stretching; PA mobilization/ AP soft tissue mobilization to lateral and posterior knee   9/7 Quad sets 3x10  SLR 2x10   Standing weight shifts x20   Manual: Patella mobilization; Extension stretching; PA mobilization/ AP soft tissue mobilization to lateral and posterior knee   Ambulation 200' with walker without increased pain   Reviewed tests and measures with the patient. She had no acute pain. Reviewed goal.    8/8 Manual: PROM into extension; trigger point release to posterior knee  SAQ x20  SLR 2x10  Row machine 2x15  15 lbs  Chest press 2x10  Leg press 2x15 25 lbs  Reviewed the use of a few other machines can use.   7/24 Nu-step warm up 5 min L2  Manual: Knee extension stretch Lat pull down from cable 2x10 10lbs  Tricpes extension 2x10 5 lbs min cuing for technique  Reviewed 2 different lat pull down  machines. She did better with the lifestyles machine. The seat on the cybex is too low. 2x10 10lbs each  Quad set 2x10  SLR 2x6        PATIENT EDUCATION:  Education details: reviewed HEP and gym equipment.  Person educated: Patient Education method: Explanation, Demonstration, Tactile cues, Verbal cues, and Handouts Education comprehension: verbalized understanding, returned demonstration, verbal cues required, tactile cues required, and needs further education   HOME EXERCISE PROGRAM: Has past HEP   ASSESSMENT:  CLINICAL IMPRESSION:  The patient did well today. She had no increase in pain with her exercises. We added a few more back into her program. She was advised to add standing marches to her home program. We will continue  to progress her back towards a gym program as tolerated. Her extension was the best it has looked in a few visits.   OBJECTIVE IMPAIRMENTS Abnormal gait, decreased activity tolerance, decreased endurance, decreased mobility, difficulty walking, decreased ROM, decreased strength, increased fascial restrictions, and pain.   ACTIVITY LIMITATIONS carrying, bending, standing, squatting, stairs, transfers, and locomotion level  PARTICIPATION LIMITATIONS: cleaning, laundry, shopping, community activity, and yard work  PERSONAL FACTORS 3+ comorbidities: Psoriatic arthritis, Bi-polar parkinsosn  are also affecting patient's functional outcome.   REHAB POTENTIAL: Good  CLINICAL DECISION MAKING: Evolving/moderate complexity  EVALUATION COMPLEXITY: Moderate   GOALS: Goals reviewed with patient? Yes  SHORT TERM GOALS: Target date: 09/09/2021 reviewed on 6/28  Patient will increase gross right LE strength by 5 lbs  Baseline: Goal status: ]not tested today   2.  Patient will be increase knee extension passive by 5 degrees  Baseline:  Goal status: Achieved improved to 08 degrees today   3.  Patient will be independent with basic HEP  Baseline:  Goal status:  independent with basic HEP. Advancing back to gym program   LONG TERM GOALS: Target date: 10/11/2021   Patient will stand for 20 min without pain in order to perform her ADL's  Baseline:  Goal status: INITIAL  2.  Patient will be independent with full gym program  Baseline:  Goal status: INITIAL  3.  Patient will ambulate without increased pain  Baseline:  Goal status: INITIAL    PLAN: PT FREQUENCY: 2x/week  PT DURATION: 8 weeks  PLANNED INTERVENTIONS: Therapeutic exercises, Therapeutic activity, Neuromuscular re-education, Balance training, Gait training, Patient/Family education, Joint mobilization, Stair training, DME instructions, Aquatic Therapy, Dry Needling, Electrical stimulation, Cryotherapy, Moist heat, Ultrasound, and Manual therapy  PLAN FOR NEXT SESSION: Continue manual as indicated, progress to resistive standing exercise, balance/SLS as tolerated   Carney Living, PT 11/21/2021, 3:26 PM

## 2021-11-21 NOTE — Chronic Care Management (AMB) (Signed)
  Care Coordination  Outreach Note  11/21/2021 Name: Tiffany [name] MRN: [id number] DOB: [birth year]   Care Coordination Outreach Attempts: An unsuccessful telephone outreach was attempted today to offer the patient information about available care coordination services as a benefit of their health plan.   Follow Up Plan:  Additional outreach attempts will be made to offer the patient care coordination information and services.   Encounter Outcome:  Pt. Request to Call Back  [place], [place],  [address] Direct Dial: [phone] [email or web address]

## 2021-11-26 ENCOUNTER — Other Ambulatory Visit: Payer: Self-pay

## 2021-11-26 ENCOUNTER — Encounter (HOSPITAL_BASED_OUTPATIENT_CLINIC_OR_DEPARTMENT_OTHER): Payer: Self-pay

## 2021-11-26 ENCOUNTER — Emergency Department (HOSPITAL_BASED_OUTPATIENT_CLINIC_OR_DEPARTMENT_OTHER)
Admission: EM | Admit: 2021-11-26 | Discharge: 2021-11-27 | Disposition: A | Discharge status: Home / Self Care | Payer: Medicare Other | Attending: Emergency Medicine | Admitting: Emergency Medicine

## 2021-11-26 ENCOUNTER — Telehealth: Payer: Self-pay | Admitting: Physician Assistant

## 2021-11-26 DIAGNOSIS — Z20822 Contact with and (suspected) exposure to covid-19: Secondary | ICD-10-CM | POA: Insufficient documentation

## 2021-11-26 DIAGNOSIS — M791 Myalgia, unspecified site: Secondary | ICD-10-CM | POA: Diagnosis present

## 2021-11-26 LAB — RESP PANEL BY RT-PCR (FLU A&B, COVID) ARPGX2
Influenza A by PCR: NEGATIVE
Influenza B by PCR: NEGATIVE
SARS Coronavirus 2 by RT PCR: NEGATIVE

## 2021-11-26 MED ORDER — PREDNISONE 5 MG PO TABS: ORAL_TABLET | ORAL | 0 refills | Status: AC

## 2021-11-26 NOTE — ED Triage Notes (Signed)
Pt states that she is having generalized body aches since noon. Pt also endorses some photosensitivity.

## 2021-11-26 NOTE — ED Provider Notes (Signed)
[facility] EMERGENCY DEPT Provider Note   CSN: [id number] Arrival date & time: 11/26/21  1758     History Chief Complaint  Patient presents with   Generalized Body Aches    HPI Tiffany [name] is a 71 y.o. female presenting for generalized body aches including episodes of muscle spasm and muscle pain over the past 2 weeks.  She denies fevers or chills nausea vomiting syncope shortness of breath.  She is otherwise ambulatory tolerating p.o. intake at this time.  No known sick contacts.  She states that her current episode has resolved.  She has a history of polymyalgia and states that she has responded to prednisone burst in the past.  She called her rheumatologist to schedule follow-up appointment for 2 days for recommended she come to the ER if she is continuing of symptoms.  She does state that her symptoms have grossly improved at this time she is currently asymptomatic.     Patient's recorded medical, surgical, social, medication list and allergies were reviewed in the Snapshot window as part of the initial history.   Review of Systems   Review of Systems  Constitutional:  Negative for chills and fever.  HENT:  Negative for ear pain and sore throat.   Eyes:  Negative for pain and visual disturbance.  Respiratory:  Negative for cough and shortness of breath.   Cardiovascular:  Negative for chest pain and palpitations.  Gastrointestinal:  Negative for abdominal pain and vomiting.  Genitourinary:  Negative for dysuria and hematuria.  Musculoskeletal:  Positive for myalgias. Negative for arthralgias and back pain.  Skin:  Negative for color change and rash.  Neurological:  Negative for seizures and syncope.  All other systems reviewed and are negative.   Physical Exam Updated Vital Signs BP (!) 173/71 (BP Location: Right Arm)   Pulse 60   Temp 98.2 F (36.8 C) (Oral)   Resp 18   Ht '5\' 4"'$  (1.626 m)   Wt 87.5 kg   SpO2 100%   BMI 33.13 kg/m  Physical  Exam Vitals and nursing note reviewed.  Constitutional:      General: She is not in acute distress.    Appearance: She is well-developed.  HENT:     Head: Normocephalic and atraumatic.  Eyes:     Conjunctiva/sclera: Conjunctivae normal.  Cardiovascular:     Rate and Rhythm: Normal rate and regular rhythm.     Heart sounds: No murmur heard. Pulmonary:     Effort: Pulmonary effort is normal. No respiratory distress.     Breath sounds: Normal breath sounds.  Abdominal:     General: There is no distension.     Palpations: Abdomen is soft.     Tenderness: There is no abdominal tenderness. There is no right CVA tenderness or left CVA tenderness.  Musculoskeletal:        General: No swelling or tenderness. Normal range of motion.     Cervical back: Neck supple.  Skin:    General: Skin is warm and dry.  Neurological:     General: No focal deficit present.     Mental Status: She is alert and oriented to person, place, and time. Mental status is at baseline.     Cranial Nerves: No cranial nerve deficit.      ED Course/ Medical Decision Making/ A&P    Procedures Procedures   Medications Ordered in ED Medications - No data to display  Medical Decision Making:    Tiffany [name] is  a 71 y.o. female who presented to the ED today with generalized body aches now resolved detailed above.     Additional history discussed with patient's family/caregivers.  Patient's presentation is complicated by their history of multiple comorbid medical problems on complex outpatient medication regimen.  Patient placed on continuous vitals and telemetry monitoring while in ED which was reviewed periodically.   Complete initial physical exam performed, notably the patient  was currently asymptomatic, ambulatory tolerating p.o. intake.      Reviewed and confirmed nursing documentation for past medical history, family history, social history.    Initial Assessment:   This is consistent with an acute  complicated illness. Differential is broad and includes rhabdomyolysis, acute kidney injury, metabolic disturbance, critical anemia.  I offered lab work and patient states that she would rather not given her resolution of symptoms. This is a patient with myalgias in the setting of known rheumatologic conditions.  She is currently ambulatory tolerating p.o. intake in no acute distress.  She states her symptoms resolved during her prolonged wait in the emergency department.  Favor likely rheumatologic etiology, historically patient has benefited from short course of prednisone burst.  Discussed risk of prednisone in a patient with bipolar disease and patient states that she has tolerated in the past.  Family will assist her in trialing this therapy.  Informed patient that she should only start the therapy if her symptoms are recurrent and then it needs to be closely followed up with her rheumatologist.  Appointment already scheduled for rheumatology later this week. Clinical Impression:  1. Myalgia      Discharge   Final Clinical Impression(s) / ED Diagnoses Final diagnoses:  Myalgia    Rx / DC Orders ED Discharge Orders          Ordered    predniSONE (DELTASONE) 5 MG tablet  Daily        11/26/21 2045              Tretha Sciara, MD 11/26/21 2325

## 2021-11-26 NOTE — Telephone Encounter (Signed)
Patient states: - She has been experiencing widespread body pain that she describes as a dull pounding, achy sensation - Rates pain a 10 currently - Her kidney disease has worsened which she was told could cause bone pain   Patient has been transferred to triage.

## 2021-11-27 ENCOUNTER — Encounter (HOSPITAL_BASED_OUTPATIENT_CLINIC_OR_DEPARTMENT_OTHER): Payer: Medicare Other | Admitting: Physical Therapy

## 2021-11-27 NOTE — Telephone Encounter (Signed)
Pt was seen at [facility] on 11/27/21.   Patient Name: Tiffany [name] Gender: Female DOB: September 16, 1950 Age: 71 Y 2 M 2 D Return Phone Number: [phone] (Primary), [phone] (Secondary) Address: City/ State/ [place]  [address] Client [place] [facility] [place] Client Site [place] [facility] [place] [facility] Provider Morene Rankins, [place]- [place] Contact Type Call Who Is Calling Patient / Member / Family / Caregiver Call Type Triage / Clinical Relationship To Patient Self Return Phone Number [phone] (Primary) Chief Complaint Pain - Generalized Reason for Call Symptomatic / Request for [place] states she has pain all over her body. She has kidney disease. Translation No Nurse Assessment Nurse: Hassell Done, RN, Joelene Millin Date/Time Eilene Ghazi Time): 11/26/2021 5:16:52 PM Confirm and document reason for call. If symptomatic, describe symptoms. ---caller states she has kidney disease and arthritis and she has dull generalized pain. no fever. Does the patient have any new or worsening symptoms? ---Yes Will a triage be completed? ---Yes Related visit to physician within the last 2 weeks? ---No Does the PT have any chronic conditions? (i.e. diabetes, asthma, this includes High risk factors for pregnancy, etc.) ---Yes List chronic conditions. ---kidney disease, arthritis, hypothyroidism, bipolar Is this a behavioral health or substance abuse call? ---No Guidelines Guideline Title Affirmed Question Affirmed Notes Nurse Date/Time (Eastern Time) Muscle Aches and Body Pain [1] SEVERE pain (e.g., excruciating, unable to do any normal activities) AND [2] not improved 2 hours after pain medicine Hassell Done, RN, Joelene Millin 11/26/2021 5:18:37 PM Disp. Time Eilene Ghazi Time) Disposition Final User 11/26/2021 5:21:50 PM See HCP within 4 Hours (or PCP triage) Yes Hassell Done, RN, Joelene Millin Final Disposition 11/26/2021 5:21:50 PM  See HCP within 4 Hours (or PCP triage) Yes Hassell Done, RN, Renea Ee Disagree/Comply Comply Caller Understands Yes PreDisposition Call Doctor Care Advice Given Per Guideline SEE HCP (OR PCP TRIAGE) WITHIN 4 HOURS: * IF OFFICE WILL BE CLOSED AND NO PCP (PRIMARY CARE PROVIDER) SECOND-LEVEL TRIAGE: You need to be seen within the next 3 or 4 hours. A nearby Urgent Care Center [facility]) is often a good source of care. Another choice is to go to the ED. Go sooner if you become worse. PAIN MEDICINES: * For pain relief, you can take either acetaminophen, ibuprofen, or naproxen. CALL BACK IF: * You become worse CARE ADVICE given per Muscle Aches and Body Pain (Adult) guideline. Referrals GO TO FACILITY UNDECIDED

## 2021-11-27 NOTE — Telephone Encounter (Signed)
FYI, Triage note. Pt went to ED

## 2021-11-29 NOTE — Chronic Care Management (AMB) (Signed)
  Care Coordination   Note   11/29/2021 Name: RACINE ERBY MRN: [id number] DOB: 1950/07/01  Byanka Landrus Pigeon is a 71 y.o. year old female who sees [facility], [place], [place] for primary care. I reached out to [place] by phone today to offer care coordination services.  Ms. Haltiwanger was given information about Care Coordination services today including:   The Care Coordination services include support from the care team which includes your Nurse Coordinator, Clinical Social Worker, or Pharmacist.  The Care Coordination team is here to help remove barriers to the health concerns and goals most important to you. Care Coordination services are voluntary, and the patient may decline or stop services at any time by request to their care team member.   Care Coordination Consent Status: Patient agreed to services and verbal consent obtained.   Follow up plan:  Telephone appointment with care coordination team member scheduled for:  11/30/2021  Encounter Outcome:  Pt. Scheduled  Noreene Larsson, [place], [place] [address] Direct Dial: [phone] [name]

## 2021-11-30 ENCOUNTER — Encounter: Payer: Self-pay | Admitting: *Deleted

## 2021-11-30 ENCOUNTER — Ambulatory Visit: Payer: Self-pay | Admitting: *Deleted

## 2021-11-30 NOTE — Patient Instructions (Signed)
Visit Information  Thank you for taking time to visit with me today. Please don't hesitate to contact me if I can be of assistance to you.   Following are the goals we discussed today:   Goals Addressed               This Visit's Progress     Pain management (pt-stated)        Care Coordination Interventions: Reviewed provider established plan for pain management Discussed importance of adherence to all scheduled medical appointments Counseled on the importance of reporting any/all new or changed pain symptoms or management strategies to pain management provider Advised patient to report to care team affect of pain on daily activities Discussed use of relaxation techniques and/or diversional activities to assist with pain reduction (distraction, imagery, relaxation, massage, acupressure, TENS, heat, and cold application Reviewed with patient prescribed pharmacological and nonpharmacological pain relief strategies Advised patient to discuss any new issues or increase in her ongoing pain with provider Screening for signs and symptoms of depression related to chronic disease state  Assessed social determinant of health barriers Pt verified her AWV completed In jan 2023. All pending appointments verified and reviewed.          Our next appointment is by telephone on 12/11/2021 at 0930  Please call the care guide team at [phone] if you need to cancel or reschedule your appointment.   If you are experiencing a Mental Health or [place] or need someone to talk to, please call the Suicide and Crisis Lifeline: 988 call the [place] National Suicide Prevention Lifeline: 815-851-7639 or TTY: (210)746-0735 TTY 925-277-5249) to talk to a trained counselor call 1-800-273-TALK (toll free, 24 hour hotline)  Patient verbalizes understanding of instructions and care plan provided today and agrees to view in [place]. Active MyChart status and patient understanding of how to  access instructions and care plan via MyChart confirmed with patient.     The patient has been provided with contact information for the care management team and has been advised to call with any health related questions or concerns.   Raina Mina, RN Care Management Coordinator [place] Office [phone]

## 2021-11-30 NOTE — Patient Outreach (Signed)
  Care Coordination   Initial Visit Note   11/30/2021 Name: Tiffany [name] MRN: [id number] DOB: 1950-09-13  Tiffany [name] is a 71 y.o. year old female who sees [facility], [place], [place] for primary care. I spoke with  Tiffany [name] by phone today.  What matters to the patients health and wellness today?  Pain management    Goals Addressed               This Visit's Progress     Pain management (pt-stated)        Care Coordination Interventions: Reviewed provider established plan for pain management Discussed importance of adherence to all scheduled medical appointments Counseled on the importance of reporting any/all new or changed pain symptoms or management strategies to pain management provider Advised patient to report to care team affect of pain on daily activities Discussed use of relaxation techniques and/or diversional activities to assist with pain reduction (distraction, imagery, relaxation, massage, acupressure, TENS, heat, and cold application Reviewed with patient prescribed pharmacological and nonpharmacological pain relief strategies Advised patient to discuss any new issues or increase in her ongoing pain with provider Screening for signs and symptoms of depression related to chronic disease state  Assessed social determinant of health barriers Pt verified her AWV completed In jan 2023. All pending appointments verified and reviewed.          SDOH assessments and interventions completed:  Yes  SDOH Interventions Today    Flowsheet Row Most Recent Value  SDOH Interventions   Food Insecurity Interventions Intervention Not Indicated  Housing Interventions Intervention Not Indicated  Transportation Interventions Intervention Not Indicated  Utilities Interventions Intervention Not Indicated        Care Coordination Interventions Activated:  Yes  Care Coordination Interventions:  Yes, provided   Follow up plan: Follow up call scheduled for 12/21/2021 @@ 0930     Encounter Outcome:  Pt. Visit Completed   Raina Mina, RN Care Management Coordinator [place] Office [phone]

## 2021-12-03 ENCOUNTER — Encounter: Payer: Self-pay | Admitting: Adult Health

## 2021-12-03 ENCOUNTER — Ambulatory Visit (INDEPENDENT_AMBULATORY_CARE_PROVIDER_SITE_OTHER): Payer: Medicare Other | Admitting: Adult Health

## 2021-12-03 DIAGNOSIS — F411 Generalized anxiety disorder: Secondary | ICD-10-CM | POA: Diagnosis not present

## 2021-12-03 DIAGNOSIS — F5105 Insomnia due to other mental disorder: Secondary | ICD-10-CM

## 2021-12-03 DIAGNOSIS — F4310 Post-traumatic stress disorder, unspecified: Secondary | ICD-10-CM

## 2021-12-03 DIAGNOSIS — F99 Mental disorder, not otherwise specified: Secondary | ICD-10-CM

## 2021-12-03 DIAGNOSIS — F319 Bipolar disorder, unspecified: Secondary | ICD-10-CM

## 2021-12-03 NOTE — Progress Notes (Signed)
Tiffany [name] [id number] Oct 31, 1950 71 y.o.  Subjective:   Patient ID:  Tiffany [name] is a 71 y.o. (DOB [birth year]) female.  Chief Complaint: No chief complaint on file.   HPI Tiffany [name] presents to the office today for follow-up of PTSD, insomnia, GAD, BPD 1.  Accompanied by husband.  Describes mood today as "ok". Pleasant. Reports some tearfulness. Mood symptoms - denies depression, anxiety and irritability - "not right now". Reports some worry and rumination - "I'm always worried about things". Reports mood is variable - "having highs and lows lasting for hours - not days or weeks". Stating "I'm doing ok - mood fluctuates. Reports some side effects from Lamictal - "blurry vision, balance issues, difficulties thinking and speaking". Stopped Lithium 12 days ago.  Earlie Server - therapist. Followed by nephrology. Varying interest and motivation. Taking medications as prescribed. Energy levels variable. Active, does not have a regular exercise routine with physical limitations Enjoys some usual interests and activities. Married. Lives with husband their daughter. Talking to family and friends.  Appetite improved. Weight is variable - 190 to 194 pounds Sleeps better some nights than others. Averages 7 to 8 hours of broken sleep - up and down during the night going to the bathroom. Focus and concentration difficulties. Completing tasks. Managing some aspects of household. Retired.  Denies SI.  Denies HI.  Denies AH Denies VH  Denies self harm. Denies substance use.  Working with P/T   Previous medications: Celexa, Zyprexa, Tegretol, Depakote, Serzone, Topamax, Seroquel, Effexor, Lexapro, Desipramine, Neurontin, Abilify, Geodon, Propanolol, Cymbalta, Cogentin, Trihexyphenadyl, Sinmmet, Provigil, Selegiline, Requip, Amantadine, Prozac, Mirapex, Azilect, Metoclopramide, Baclofen, Artane, Namenda, Latuda -SOB and heart palpitations.    [facility] Office Visit from 02/16/2021  in [facility] Visit from 05/14/2019 in [facility]  Total GAD-7 Score 13 [facility] Office Visit from 06/07/2019 in [facility]  Total Score (max 30 points ) 29      PHQ2-9    [place] from 11/30/2021 in [facility] from 11/06/2021 [facility] Office Visit from 07/30/2021 in [facility] Visit from 07/02/2021 in [facility] from 04/02/2021 in [facility]  PHQ-2 Total Score 0 0 '2 1 1  '$ PHQ-9 Total Score -- -- '11 6 1      '$ Flowsheet Row ED from 11/26/2021 in [facility] Clinical Support from 04/02/2021 in [facility] from 03/21/2021 in [facility]  C-SSRS RISK CATEGORY No Risk No Risk No Risk        Review of Systems:  Review of Systems  Musculoskeletal:  Negative for gait problem.  Neurological:  Negative for tremors.  Psychiatric/Behavioral:         Please refer to HPI    Medications: I have reviewed the patient's current medications.  Current Outpatient Medications  Medication Sig Dispense Refill   aMILoride (MIDAMOR) 5 MG tablet Take 5 mg by mouth every morning.     augmented betamethasone dipropionate (DIPROLENE-AF) 0.05 % cream Apply topically.     Azelaic Acid 15 % gel Apply topically as needed.     B Complex Vitamins (VITAMIN B COMPLEX) TABS 1 tablet Orally Once daily     benzonatate (TESSALON) 100 MG capsule 1 capsule as needed Orally Three times a  day     cholecalciferol (VITAMIN D3) 25 MCG (1000 UNIT) tablet Take 1,000 Units by mouth daily.     Cholecalciferol 50 MCG (2000 UT) TABS 1 tablet Orally Once a day for 30 day(s)     Golimumab (SIMPONI ARIA IV)      lamoTRIgine (LAMICTAL) 100 MG tablet  Take one tablet in the morning and take one tablet at lunch. 180 tablet 1   lamoTRIgine (LAMICTAL) 200 MG tablet Take 1 tablet (200 mg total) by mouth at bedtime. 90 tablet 3   levothyroxine (SYNTHROID) 100 MCG tablet Take 100 mcg by mouth. Takes 100 mg Saturday and Sunday     levothyroxine (SYNTHROID) 112 MCG tablet Take 112 mcg by mouth. Takes days Monday through Friday.     lithium carbonate 150 MG capsule TAKE 1 CAPSULE(150 MG) BY MOUTH AT BEDTIME. (Patient not taking: Reported on 11/30/2021) 90 capsule 1   LORazepam (ATIVAN) 1 MG tablet TAKE 1 TABLET(1 MG) BY MOUTH TWICE DAILY AS NEEDED 60 tablet 2   metroNIDAZOLE (METROGEL) 0.75 % gel Apply to face 1-2 times daily 45 g 0   Omega 3 340 MG CPDR 1 capsule Orally Once a day for 30 day(s)     predniSONE (DELTASONE) 5 MG tablet Take 3 tablets (15 mg total) by mouth daily for 3 days, THEN 2 tablets (10 mg total) daily for 3 days, THEN 1 tablet (5 mg total) daily for 3 days. 18 tablet 0   sodium bicarbonate 650 MG tablet 1 tablet Orally Once daily     traZODone (DESYREL) 50 MG tablet Take one to two tablets at bedtime. 90 tablet 3   No current facility-administered medications for this visit.    Medication Side Effects: None  Allergies:  Allergies  Allergen Reactions   Aripiprazole Other (See Comments)    Parkinsonism      Lactose Intolerance (Gi) Diarrhea   Methotrexate Other (See Comments)    Hair loss, severe stomatitis     Cefdinir Diarrhea    Other reaction(s): Diarrhea Yeast infection and fever; negative c diff   Etanercept Other (See Comments)    Headaches     Exemestane Other (See Comments)    Suicidal thoughts with medication     Fluoxetine Other (See Comments)    Parkinsonism   Lactose     Other reaction(s): diarrhea Other reaction(s): diarrhea Other reaction(s): diarrhea   Methylprednisolone Sodium Succ Other (See Comments)    Agitated mania   Epinephrine Palpitations    tachycardia    Nitrofurantoin Nausea  And Vomiting and Rash      Other reaction(s): rash, diarrhea Other reaction(s): rash, diarrhea Other reaction(s): rash, diarrhea Other reaction(s): rash, diarrhea Other reaction(s): rash, diarrhea Other reaction(s): rash, diarrhea    Past Medical History:  Diagnosis Date   Bipolar 1 disorder (HCC)    Breast cancer (HCC)    Chronic diarrhea    loose stools twice a day on average for years   Chronic kidney disease (CKD), stage III (moderate) ([place])    Depression 1987   Family history of breast cancer    Hospitalization or health care facility admission within last 6 months 04/2019   for fall/seizures   Malignant neoplasm of overlapping sites of left breast in female, estrogen receptor positive ([facility]) 03/14/2016   Dx in 09/2014, s/p bilateral mastectomies and ALND, 0/10 LN. 1.4 cm  Grade I invasive lobular, ER and PR +/Her--, Ki 67 <5% Tried anastrozole for one month, but developed suicidal idea  Osteoarthritis, knee 09/24/2019   Xray 09/2019   Parkinson's disease ([facility]) 2012   Personal history of malignant neoplasm of breast    Polyposis of colon    Psoriatic arthritis ([place])    PTSD (post-traumatic stress disorder)    Secondary hyperparathyroidism ([place])    Tardive dyskinesia    Thyroid disease     Past Medical History, Surgical history, Social history, and Family history were reviewed and updated as appropriate.   Please see review of systems for further details on the patient's review from today.   Objective:   Physical Exam:  There were no vitals taken for this visit.  Physical Exam Constitutional:      General: She is not in acute distress. Musculoskeletal:        General: No deformity.  Neurological:     Mental Status: She is alert and oriented to person, place, and time.     Coordination: Coordination normal.  Psychiatric:        Attention and Perception: Attention and perception normal. She does not perceive auditory or visual hallucinations.        Mood and  Affect: Mood normal. Mood is not anxious or depressed. Affect is not labile, blunt, angry or inappropriate.        Speech: Speech normal.        Behavior: Behavior normal.        Thought Content: Thought content normal. Thought content is not paranoid or delusional. Thought content does not include homicidal or suicidal ideation. Thought content does not include homicidal or suicidal plan.        Cognition and Memory: Cognition and memory normal.        Judgment: Judgment normal.     Comments: Insight intact     Lab Review:     Component Value Date/Time   NA 137 10/29/2021 1055   NA 142 10/17/2021 0000   K 5.1 10/29/2021 1055   CL 110 10/29/2021 1055   CO2 17 (L) 10/29/2021 1055   GLUCOSE 91 10/29/2021 1055   BUN 35 (H) 10/29/2021 1055   BUN 30 (A) 10/17/2021 0000   CREATININE 2.47 (H) 10/29/2021 1055   CREATININE 2.29 (H) 05/22/2021 1044   CALCIUM 9.7 10/29/2021 1055   PROT 7.6 07/02/2021 1048   ALBUMIN 4.8 10/17/2021 0000   AST 13 07/02/2021 1048   ALT 14 07/02/2021 1048   ALKPHOS 125 (H) 07/02/2021 1048   BILITOT 1.2 07/02/2021 1048   GFRNONAA 20 (L) 10/29/2021 1055   GFRAA 25 (L) 09/13/2019 1243       Component Value Date/Time   WBC 9.5 10/29/2021 1055   RBC 4.64 10/29/2021 1055   HGB 14.1 10/29/2021 1055   HCT 44.1 10/29/2021 1055   PLT 214 10/29/2021 1055   MCV 95.0 10/29/2021 1055   MCH 30.4 10/29/2021 1055   MCHC 32.0 10/29/2021 1055   RDW 13.2 10/29/2021 1055   LYMPHSABS 3.2 07/02/2021 1048   MONOABS 0.7 07/02/2021 1048   EOSABS 0.2 07/02/2021 1048   BASOSABS 0.1 07/02/2021 1048    Lithium Lvl  Date Value Ref Range Status  05/22/2021 0.7 0.6 - 1.2 mmol/L Final     No results found for: "PHENYTOIN", "PHENOBARB", "VALPROATE", "CBMZ"   .res Assessment: Plan:    Plan:  Therapist - Christina Hussami - [facility]  Trazadone '50mg'$  - 1 to 2 at bedtime Lorazepam '1mg'$  BID for anxiety - may take one tablet extra for severe anxiety symptoms. Taking one at  night routinely.  Lamictal '200mg'$  hs Lamictal '150mg'$  every morning.  NAC tablets BID - for obsessive thoughts, worry, and rumination - unable to take SSRI's  RTC weekly  Counseled patient regarding potential benefits, risks, and side effects of Lamictal to include potential risk of Stevens-Johnson syndrome. Advised patient to stop taking Lamictal and contact office immediately if rash develops and to seek urgent medical attention if rash is severe and/or spreading quickly.  Discussed potential benefits, risk, and side effects of benzodiazepines to include potential risk of tolerance and dependence, as well as possible drowsiness. Advised patient not to drive if experiencing drowsiness and to take lowest possible effective dose to minimize risk of dependence and tolerance.   There are no diagnoses linked to this encounter.   Please see After Visit Summary for patient specific instructions.  Future Appointments  Date Time Provider [place]  12/10/2021 10:00 AM Inda Coke, [place] [facility]  12/20/2021  2:00 PM Hussami, Rachel Bo, LCSW [facility] None  12/21/2021  9:30 AM Tobi Bastos, RN [facility] None  12/31/2021  8:45 AM Valora Piccolo Barnabas Lister, RD [facility]  12/31/2021 11:30 AM Shamleffer, Melanie Crazier, MD [facility] None  01/10/2022  2:00 PM Hussami, Christina R, LCSW [facility] None  01/29/2022  2:00 PM Hussami, Christina R, LCSW [facility] None  04/15/2022  1:45 PM [facility] HEALTH COACH [facility]    No orders of the defined types were placed in this encounter.   -------------------------------

## 2021-12-04 ENCOUNTER — Encounter (HOSPITAL_BASED_OUTPATIENT_CLINIC_OR_DEPARTMENT_OTHER): Payer: Medicare Other | Admitting: Physical Therapy

## 2021-12-04 ENCOUNTER — Encounter (HOSPITAL_BASED_OUTPATIENT_CLINIC_OR_DEPARTMENT_OTHER): Payer: Self-pay

## 2021-12-05 ENCOUNTER — Telehealth: Payer: Self-pay

## 2021-12-05 NOTE — Telephone Encounter (Signed)
        Patient  visited [facility] on 9/18    Telephone encounter attempt :1sr    A HIPAA compliant voice message was left requesting a return call.  Instructed patient to call back    [place], [place] Management  [phone] [address], [place], [place] [address] Phone: [phone] Email: [email or web address]

## 2021-12-06 ENCOUNTER — Telehealth: Payer: Self-pay

## 2021-12-06 NOTE — Telephone Encounter (Signed)
        Patient  visited [facility] on 9/18   Telephone encounter attempt :  2nd  A HIPAA compliant voice message was left requesting a return call.  Instructed patient to call back    [place], [place] Management  [phone] [address], [place], [place] [address] Phone: [phone] Email: [email or web address]

## 2021-12-07 ENCOUNTER — Ambulatory Visit (HOSPITAL_BASED_OUTPATIENT_CLINIC_OR_DEPARTMENT_OTHER): Payer: Medicare Other | Admitting: Physical Therapy

## 2021-12-07 DIAGNOSIS — G8929 Other chronic pain: Secondary | ICD-10-CM

## 2021-12-07 DIAGNOSIS — R2689 Other abnormalities of gait and mobility: Secondary | ICD-10-CM

## 2021-12-07 DIAGNOSIS — M6281 Muscle weakness (generalized): Secondary | ICD-10-CM

## 2021-12-07 DIAGNOSIS — R2681 Unsteadiness on feet: Secondary | ICD-10-CM

## 2021-12-07 NOTE — Therapy (Signed)
OUTPATIENT PHYSICAL THERAPY LOWER EXTREMITY TREATMENT/Progress Note    Patient Name: Tiffany [name] MRN: [id number] DOB:1951-03-02, 71 y.o., female Today's Date: 12/07/2021   PT End of Session - 12/07/21 1455     Visit Number 9    Number of Visits 13    Date for PT Re-Evaluation 12/27/21    Authorization Type KX right now    PT Start Time 1345    PT Stop Time 1429    PT Time Calculation (min) 44 min    Activity Tolerance Patient tolerated treatment well    Behavior During Therapy [facility] for tasks assessed/performed             Progress Note Reporting Period 11/14/2021 to 12/27/2021  See note below for Objective Data and Assessment of Progress/Goals.       Past Medical History:  Diagnosis Date   Bipolar 1 disorder ([facility])    Breast cancer ([facility])    Chronic diarrhea    loose stools twice a day on average for years   Chronic kidney disease (CKD), stage III (moderate) ([place])    Depression 1987   Family history of breast cancer    Hospitalization or health care facility admission within last 6 months 04/2019   for fall/seizures   Malignant neoplasm of overlapping sites of left breast in female, estrogen receptor positive ([place]) 03/14/2016   Dx in 09/2014, s/p bilateral mastectomies and ALND, 0/10 LN. 1.4 cm  Grade I invasive lobular, ER and PR +/Her--, Ki 67 <5% Tried anastrozole for one month, but developed suicidal idea   Osteoarthritis, knee 09/24/2019   Xray 09/2019   Parkinson's disease ([place]) 2012   Personal history of malignant neoplasm of breast    Polyposis of colon    Psoriatic arthritis ([place])    PTSD (post-traumatic stress disorder)    Secondary hyperparathyroidism ([place])    Tardive dyskinesia    Thyroid disease    Past Surgical History:  Procedure Laterality Date   [place] Bilateral 09/19/2014   [address] Bilateral 1974   Patient  Active Problem List   Diagnosis Date Noted   Genetic testing 06/14/2021   Family history of breast cancer 06/05/2021   Osteoarthritis, knee 09/24/2019   CKD (chronic kidney disease) stage 4, GFR 15-29 ml/min ([place]) 09/24/2019   Severe recurrent major depression without psychotic features ([place]) 09/09/2019   Bipolar I disorder, most recent episode depressed ([place])    Transaminitis    Essential hypertension    Encephalopathy 04/26/2019   Leukocytosis 04/21/2019   Thrombocytopenia ([place]) 09/04/2018   Vitamin D deficiency 09/04/2018   Psoriatic arthritis ([place]) 09/04/2018   Tubular adenoma of colon 05/28/2017   History of colonic polyps 05/28/2017   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 09/12/2016   Malignant neoplasm of overlapping sites of left breast in female, estrogen receptor positive ([place]) 03/14/2016   Tardive dyskinesia 10/18/2015   History of breast cancer left 2016 12/27/2014   Osteopenia determined by x-ray 10/31/2014   Rosacea 05/21/2007   Bipolar affective disorder, mixed ([place]) 08/16/2004   Acquired hypothyroidism 04/03/1996    PCP: Inda Coke, PA  REFERRING PROVIDER: Inda Coke, PA  REFERRING DIAG:  [phone] - recurrent pain of right knee  THERAPY DIAG:  Other abnormalities of gait and mobility  Unsteadiness on feet  Muscle weakness (generalized)  Chronic pain of right knee  Rationale for Evaluation and Treatment Rehabilitation  ONSET DATE: 3 weeks ago  SUBJECTIVE:   SUBJECTIVE STATEMENT: The patient had acute onset of whole body muscular pain. She reports it happens about 1x a day and it gets severe for 2-3 hours but then the pain goes away. Her knee has been doing well.   PERTINENT HISTORY: Bi-polar, CKD, depression, breast cancer, OA, Parkinson's, Psoriatic Arthritis, PTSD, tardive diskenesia,   PAIN:  Are you having pain? Yes: NPRS scale: 6/10 Pain location: right lateral knee Pain description:  aching Aggravating factors: standing, walking , twisting Relieving factors: rest  PRECAUTIONS: None  WEIGHT BEARING RESTRICTIONS No  FALLS:  Has patient fallen in last 6 months? No  LIVING ENVIRONMENT: Lives with husband  OCCUPATION: retired   PLOF: Independent uses an assitvie device at times  PATIENT GOALS   To have less pain in her knee and to get back into the gym   OBJECTIVE:   DIAGNOSTIC FINDINGS:   PATIENT SURVEYS:   COGNITION:  Overall cognitive status: Within functional limits for tasks assessed     SENSATION: WFL   POSTURE: rounded shoulders and forward head  PALPATION: Trigger points in lower IT band   LOWER EXTREMITY ROM:  Active PROM Right eval Left eval Right  9/6   Hip flexion      Hip extension      Hip abduction      Hip adduction      Hip internal rotation      Hip external rotation      Knee flexion 94  106   Knee extension -13  -8   Ankle dorsiflexion      Ankle plantarflexion      Ankle inversion      Ankle eversion       (Blank rows = not tested)  LOWER EXTREMITY MMT:  MMT Right eval Left eval  Hip flexion 16.1 23.1  Hip extension    Hip abduction    Hip adduction 29.7 27.3  Hip internal rotation    Hip external rotation    Knee flexion    Knee extension 25.6 33  Ankle dorsiflexion    Ankle plantarflexion    Ankle inversion    Ankle eversion     (Blank rows = not tested)   GAIT: Decreased single leg stance time on the right    TODAY'S TREATMENT: 9/29 Seated march 2x15 yellow  Seated hip abduction 2x15 yellow  Hamstring curl 2x10 yellow   Heel raise x20  Standing march x20    Decompression:   Legs up on a pillow or two -> palms up -> deep breathing for 5-10 minutes  Leg lengthener: push heel towards the wall with deep breathing   Sink into sand: press leg down and breath 5-10 x   Shoulder sink into the sand 5-10 x    9/13 Quad sets 3x10  SLR 2x10 bilateral  SAQ 2x15 bilateral   Supine  hip abduction green 2x15   Standing weight shifts x20  Standing slow march x20   Manual: Patella mobilization; Extension stretching; PA mobilization/ AP soft tissue mobilization to lateral and posterior knee      PATIENT EDUCATION:  Education details: reviewed HEP and gym equipment.  Person educated: Patient Education method: Explanation, Demonstration, Tactile cues, Verbal cues, and Handouts Education comprehension: verbalized understanding, returned demonstration, verbal cues required, tactile cues required, and needs further education   HOME EXERCISE PROGRAM: Has past HEP   ASSESSMENT:  CLINICAL IMPRESSION: The patient  is doing well today. She has been having generalized pain. Her MD thinks some it may be stress and tension. We reviewed decompression positions as well as rhythmic breathing. Overall she is doing well with her knee. She is feeling about ready to begin her gym program again. We will continue for another 1-2 visits she will try her decompression exercises at home. We reviewed sitting and standing exercises as well  OBJECTIVE IMPAIRMENTS Abnormal gait, decreased activity tolerance, decreased endurance, decreased mobility, difficulty walking, decreased ROM, decreased strength, increased fascial restrictions, and pain.   ACTIVITY LIMITATIONS carrying, bending, standing, squatting, stairs, transfers, and locomotion level  PARTICIPATION LIMITATIONS: cleaning, laundry, shopping, community activity, and yard work  PERSONAL FACTORS 3+ comorbidities: Psoriatic arthritis, Bi-polar parkinsosn  are also affecting patient's functional outcome.   REHAB POTENTIAL: Good  CLINICAL DECISION MAKING: Evolving/moderate complexity  EVALUATION COMPLEXITY: Moderate   GOALS: Goals reviewed with patient? Yes  SHORT TERM GOALS: Target date: 09/09/2021 reviewed on 6/28  Patient will increase gross right LE strength by 5 lbs  Baseline: Goal status: ]not tested today   2.  Patient  will be increase knee extension passive by 5 degrees  Baseline:  Goal status: Achieved improved to 08 degrees today   3.  Patient will be independent with basic HEP  Baseline:  Goal status: independent with basic HEP. Advancing back to gym program   LONG TERM GOALS: Target date: 10/11/2021   Patient will stand for 20 min without pain in order to perform her ADL's  Baseline:  Goal status: INITIAL  2.  Patient will be independent with full gym program  Baseline:  Goal status: INITIAL  3.  Patient will ambulate without increased pain  Baseline:  Goal status: INITIAL    PLAN: PT FREQUENCY: 2x/week  PT DURATION: 8 weeks  PLANNED INTERVENTIONS: Therapeutic exercises, Therapeutic activity, Neuromuscular re-education, Balance training, Gait training, Patient/Family education, Joint mobilization, Stair training, DME instructions, Aquatic Therapy, Dry Needling, Electrical stimulation, Cryotherapy, Moist heat, Ultrasound, and Manual therapy  PLAN FOR NEXT SESSION: Continue manual as indicated, progress to resistive standing exercise, balance/SLS as tolerated   Carney Living, PT 12/07/2021, 2:56 PM

## 2021-12-10 ENCOUNTER — Encounter: Payer: Self-pay | Admitting: Physician Assistant

## 2021-12-10 ENCOUNTER — Ambulatory Visit (INDEPENDENT_AMBULATORY_CARE_PROVIDER_SITE_OTHER): Payer: Medicare Other | Admitting: Physician Assistant

## 2021-12-10 VITALS — BP 130/78 | HR 59 | Temp 97.7°F | Ht 64.0 in | Wt 201.0 lb

## 2021-12-10 DIAGNOSIS — N184 Chronic kidney disease, stage 4 (severe): Secondary | ICD-10-CM | POA: Diagnosis not present

## 2021-12-10 DIAGNOSIS — E039 Hypothyroidism, unspecified: Secondary | ICD-10-CM | POA: Diagnosis not present

## 2021-12-10 DIAGNOSIS — R634 Abnormal weight loss: Secondary | ICD-10-CM | POA: Diagnosis not present

## 2021-12-10 LAB — COMPREHENSIVE METABOLIC PANEL
ALT: 12 U/L (ref 0–35)
AST: 14 U/L (ref 0–37)
Albumin: 4.4 g/dL (ref 3.5–5.2)
Alkaline Phosphatase: 122 U/L — ABNORMAL HIGH (ref 39–117)
BUN: 36 mg/dL — ABNORMAL HIGH (ref 6–23)
CO2: 24 mEq/L (ref 19–32)
Calcium: 9.5 mg/dL (ref 8.4–10.5)
Chloride: 109 mEq/L (ref 96–112)
Creatinine, Ser: 2.45 mg/dL — ABNORMAL HIGH (ref 0.40–1.20)
GFR: 19.11 mL/min — ABNORMAL LOW (ref >60.00)
Glucose, Bld: 88 mg/dL (ref 70–99)
Potassium: 4.8 mEq/L (ref 3.5–5.1)
Sodium: 142 mEq/L (ref 135–145)
Total Bilirubin: 0.7 mg/dL (ref 0.2–1.2)
Total Protein: 7 g/dL (ref 6.0–8.3)

## 2021-12-10 LAB — LIPID PANEL
Cholesterol: 182 mg/dL (ref 0–200)
HDL: 59.9 mg/dL (ref >39.00)
LDL Cholesterol: 105 mg/dL — ABNORMAL HIGH (ref 0–99)
NonHDL: 122.5
Total CHOL/HDL Ratio: 3
Triglycerides: 89 mg/dL (ref 0.0–149.0)
VLDL: 17.8 mg/dL (ref 0.0–40.0)

## 2021-12-10 LAB — URINALYSIS, ROUTINE W REFLEX MICROSCOPIC
Bilirubin Urine: NEGATIVE
Hgb urine dipstick: NEGATIVE
Ketones, ur: NEGATIVE
Leukocytes,Ua: NEGATIVE
Nitrite: NEGATIVE
RBC / HPF: NONE SEEN (ref 0–?)
Specific Gravity, Urine: 1.01 (ref 1.000–1.030)
Urine Glucose: NEGATIVE
Urobilinogen, UA: 0.2 (ref 0.0–1.0)
pH: 6 (ref 5.0–8.0)

## 2021-12-10 LAB — TSH: TSH: 0.38 u[IU]/mL (ref 0.35–5.50)

## 2021-12-10 LAB — VITAMIN D 25 HYDROXY (VIT D DEFICIENCY, FRACTURES): VITD: 41.67 ng/mL (ref 30.00–100.00)

## 2021-12-10 LAB — PHOSPHORUS: Phosphorus: 3.1 mg/dL (ref 2.3–4.6)

## 2021-12-10 NOTE — Progress Notes (Signed)
Tiffany [name] is a 71 y.o. female here for a follow up of a pre-existing problem.  History of Present Illness:   Chief Complaint  Patient presents with   Weight Check    HPI  CKD stage 4 Seeing new nephrologist next month, she is looking forward to this Seeing RD for CKD management -- has found this helpful, seeing them for follow-up soon She has done a lot of reading about this and is interested in getting more blood work and UA prior to office appt   Unintentional Weight Loss She is eating well and feels good Weight has stabilized so far Mood is stabilizing as well and she thinks that this is helping with weight maintenance   Hypothyroidism She did see Dr Chalmers Cater a few months ago She is currently taking 112 mcg 5 days a week and then 100 mcg on Sat and Sun Overall doing well Planning to tx care to Dr. Kelton Pillar in 3 weeks      Past Medical History:  Diagnosis Date   Bipolar 1 disorder ([place])    Breast cancer ([place])    Chronic diarrhea    loose stools twice a day on average for years   Chronic kidney disease (CKD), stage III (moderate) ([place])    Depression 1987   Family history of breast cancer    Hospitalization or health care facility admission within last 6 months 04/2019   for fall/seizures   Malignant neoplasm of overlapping sites of left breast in female, estrogen receptor positive ([place]) 03/14/2016   Dx in 09/2014, s/p bilateral mastectomies and ALND, 0/10 LN. 1.4 cm  Grade I invasive lobular, ER and PR +/Her--, Ki 67 <5% Tried anastrozole for one month, but developed suicidal idea   Osteoarthritis, knee 09/24/2019   Xray 09/2019   Parkinson's disease 2012   Personal history of malignant neoplasm of breast    Polyposis of colon    Psoriatic arthritis ([place])    PTSD (post-traumatic stress disorder)    Secondary hyperparathyroidism ([place])    Tardive dyskinesia    Thyroid disease      Social History   Tobacco Use   Smoking status: Never   Smokeless tobacco:  Never  Vaping Use   Vaping Use: Never used  Substance Use Topics   Alcohol use: Never   Drug use: Never    Past Surgical History:  Procedure Laterality Date   ABDOMINAL HYSTERECTOMY  1987   CHOLECYSTECTOMY  1979   DILATION AND CURETTAGE OF UTERUS  1973   MASTECTOMY Bilateral 09/19/2014   TONSILLECTOMY  1970   URETERAL REIMPLANTION Bilateral 1974    Family History  Problem Relation Age of Onset   Lymphoma Mother 36   Lymphoma Sister 47   Breast cancer Sister 82   Colon polyps Sister    Lung cancer Sister        former smoker   Stroke Maternal Grandfather    Diabetes Paternal Grandfather    Colon cancer Neg Hx    Esophageal cancer Neg Hx    Stomach cancer Neg Hx    Rectal cancer Neg Hx     Allergies  Allergen Reactions   Aripiprazole Other (See Comments)    Parkinsonism      Lactose Intolerance (Gi) Diarrhea   Methotrexate Other (See Comments)    Hair loss, severe stomatitis     Cefdinir Diarrhea    Other reaction(s): Diarrhea Yeast infection and fever; negative c diff   Etanercept Other (See Comments)  Headaches     Exemestane Other (See Comments)    Suicidal thoughts with medication     Fluoxetine Other (See Comments)    Parkinsonism   Methylprednisolone Sodium Succ Other (See Comments)    Agitated mania   Epinephrine Palpitations    tachycardia    Nitrofurantoin Nausea And Vomiting and Rash      Other reaction(s): rash, diarrhea Other reaction(s): rash, diarrhea Other reaction(s): rash, diarrhea Other reaction(s): rash, diarrhea Other reaction(s): rash, diarrhea Other reaction(s): rash, diarrhea    Current Medications:   Current Outpatient Medications:    Acetylcysteine (N-ACETYL CYSTEINE) 600 MG CAPS, 1 capsule Orally Twice a day, Disp: , Rfl:    aMILoride (MIDAMOR) 5 MG tablet, Take 5 mg by mouth every morning., Disp: , Rfl:    Azelaic Acid 15 % gel, Apply topically as needed., Disp: , Rfl:    B Complex Vitamins (VITAMIN B COMPLEX)  TABS, 1 tablet Orally Once daily, Disp: , Rfl:    benzonatate (TESSALON) 100 MG capsule, as needed., Disp: , Rfl:    betamethasone dipropionate 8.24 % cream, 1 application Externally Twice a day, Disp: , Rfl:    Cholecalciferol 50 MCG (2000 UT) TABS, 1 tablet Orally Once a day for 30 day(s), Disp: , Rfl:    Golimumab (SIMPONI ARIA IV), , Disp: , Rfl:    lamoTRIgine (LAMICTAL) 100 MG tablet, Take one tablet in the morning and take one tablet at lunch., Disp: 180 tablet, Rfl: 1   lamoTRIgine (LAMICTAL) 200 MG tablet, Take 1 tablet (200 mg total) by mouth at bedtime., Disp: 90 tablet, Rfl: 3   levothyroxine (SYNTHROID) 100 MCG tablet, Take 100 mcg by mouth. Takes 100 mg Saturday and Sunday, Disp: , Rfl:    levothyroxine (SYNTHROID) 112 MCG tablet, Take 112 mcg by mouth. Takes days Monday through Friday., Disp: , Rfl:    LORazepam (ATIVAN) 1 MG tablet, TAKE 1 TABLET(1 MG) BY MOUTH TWICE DAILY AS NEEDED, Disp: 60 tablet, Rfl: 2   metroNIDAZOLE (METROGEL) 0.75 % gel, Apply to face 1-2 times daily, Disp: 45 g, Rfl: 0   Omega-3 Fatty Acids (FISH OIL) 1200 MG CAPS, Take 1 capsule by mouth in the morning and at bedtime., Disp: , Rfl:    sodium bicarbonate 650 MG tablet, Take 650 mg by mouth 2 (two) times daily., Disp: , Rfl:    traZODone (DESYREL) 50 MG tablet, Take one to two tablets at bedtime., Disp: 90 tablet, Rfl: 3   Review of Systems:   ROS Negative unless otherwise specified per HPI.   Vitals:   Vitals:   12/10/21 1013  BP: 130/78  Pulse: (!) 59  Temp: 97.7 F (36.5 C)  TempSrc: Temporal  SpO2: 95%  Weight: 201 lb (91.2 kg)  Height: '5\' 4"'$  (1.626 m)     Body mass index is 34.5 kg/m.  Physical Exam:   Physical Exam Vitals and nursing note reviewed.  Constitutional:      General: She is not in acute distress.    Appearance: She is well-developed. She is not ill-appearing or toxic-appearing.  Cardiovascular:     Rate and Rhythm: Normal rate and regular rhythm.     Pulses:  Normal pulses.     Heart sounds: Normal heart sounds, S1 normal and S2 normal.  Pulmonary:     Effort: Pulmonary effort is normal.     Breath sounds: Normal breath sounds.  Skin:    General: Skin is warm and dry.  Neurological:  Mental Status: She is alert.     GCS: GCS eye subscore is 4. GCS verbal subscore is 5. GCS motor subscore is 6.  Psychiatric:        Speech: Speech normal.        Behavior: Behavior normal. Behavior is cooperative.     Assessment and Plan:   CKD (chronic kidney disease) stage 4, GFR 15-29 ml/min [facility]) Nephrology appt next month Will update blood work today and provide recommendations accordingly, defer to nephrology prn  Unintentional weight loss Stable Continue to monitor  Acquired hypothyroidism Update TSH and will defer changes to Forde Dandy, PA-C

## 2021-12-10 NOTE — Patient Instructions (Signed)
It was great to see you!  Keep up the good work!  I will be in touch with the plan.

## 2021-12-11 ENCOUNTER — Encounter: Payer: Self-pay | Admitting: Adult Health

## 2021-12-11 ENCOUNTER — Ambulatory Visit (INDEPENDENT_AMBULATORY_CARE_PROVIDER_SITE_OTHER): Payer: Medicare Other | Admitting: Adult Health

## 2021-12-11 ENCOUNTER — Encounter: Payer: Self-pay | Admitting: Physician Assistant

## 2021-12-11 DIAGNOSIS — F99 Mental disorder, not otherwise specified: Secondary | ICD-10-CM

## 2021-12-11 DIAGNOSIS — F319 Bipolar disorder, unspecified: Secondary | ICD-10-CM | POA: Diagnosis not present

## 2021-12-11 DIAGNOSIS — F411 Generalized anxiety disorder: Secondary | ICD-10-CM | POA: Diagnosis not present

## 2021-12-11 DIAGNOSIS — F4310 Post-traumatic stress disorder, unspecified: Secondary | ICD-10-CM | POA: Diagnosis not present

## 2021-12-11 DIAGNOSIS — F5105 Insomnia due to other mental disorder: Secondary | ICD-10-CM

## 2021-12-11 MED ORDER — ATORVASTATIN CALCIUM 10 MG PO TABS: 10.0000 mg | ORAL_TABLET | Freq: Every day | ORAL | 1 refills | Status: DC

## 2021-12-11 NOTE — Progress Notes (Signed)
Tiffany [name] [id number] October 04, 1950 71 y.o.  Subjective:   Patient ID:  Tiffany [name] is a 71 y.o. (DOB 03-06-1951) female.  Chief Complaint: No chief complaint on file.   HPI Tiffany [name] presents to the office today for follow-up of PTSD, insomnia, GAD, BPD 1.  Accompanied by husband.  Describes mood today as "ok". Pleasant. Reports some tearfulness. Mood symptoms - reports depression, anxiety and irritability - "about every third day". Reports some worry and rumination. Reports mood is variable - "cycling in and out of mania every three days". Stating "I'm doing ok". Recent labs indicate a lower TSH - plans to follow up with endocrinologist.  Earlie Server - therapist. Followed by nephrology. Varying interest and motivation. Taking medications as prescribed. Energy levels variable. Active, does not have a regular exercise routine with physical limitations Enjoys some usual interests and activities. Married. Lives with husband their daughter. Talking to family and friends.  Appetite improved. Weight is variable - 201 pounds Sleeps better some nights than others. Averages 7 to 8 hours. Focus and concentration difficulties. Completing tasks. Managing some aspects of household. Retired.  Denies SI.  Denies HI.  Denies AH Denies VH  Denies self harm. Denies substance use.  Working with P/T   Previous medications: Celexa, Zyprexa, Tegretol, Depakote, Serzone, Topamax, Seroquel, Effexor, Lexapro, Desipramine, Neurontin, Abilify, Geodon, Propanolol, Cymbalta, Cogentin, Trihexyphenadyl, Sinmmet, Provigil, Selegiline, Requip, Amantadine, Prozac, Mirapex, Azilect, Metoclopramide, Baclofen, Artane, Namenda, Latuda -SOB and heart palpitations.   [place] Office Visit from 02/16/2021 in [place] Visit from 05/14/2019 in [facility]  Total GAD-7 Score 13 [place] Office Visit from 06/07/2019  in [place]  Total Score (max 30 points ) 29      PHQ2-9    [place] from 11/30/2021 in [facility] Coordination Nutrition from 11/06/2021 [facility] Office Visit from 07/30/2021 in [place] Visit from 07/02/2021 in [facility] from 04/02/2021 in [place]  PHQ-2 Total Score 0 0 '2 1 1  '$ PHQ-9 Total Score -- -- '11 6 1      '$ Flowsheet Row ED from 11/26/2021 in [facility] Clinical Support from 04/02/2021 in [facility] from 03/21/2021 in [facility]  C-SSRS RISK CATEGORY No Risk No Risk No Risk        Review of Systems:  Review of Systems  Musculoskeletal:  Negative for gait problem.  Neurological:  Negative for tremors.  Psychiatric/Behavioral:         Please refer to HPI    Medications: I have reviewed the patient's current medications.  Current Outpatient Medications  Medication Sig Dispense Refill   Acetylcysteine (N-ACETYL CYSTEINE) 600 MG CAPS 1 capsule Orally Twice a day     aMILoride (MIDAMOR) 5 MG tablet Take 5 mg by mouth every morning.     atorvastatin (LIPITOR) 10 MG tablet Take 1 tablet (10 mg total) by mouth daily. 90 tablet 1   Azelaic Acid 15 % gel Apply topically as needed.     B Complex Vitamins (VITAMIN B COMPLEX) TABS 1 tablet Orally Once daily     benzonatate (TESSALON) 100 MG capsule as needed.     betamethasone dipropionate 3.90 % cream 1 application Externally Twice a day  Cholecalciferol 50 MCG (2000 UT) TABS 1 tablet Orally Once a day for 30 day(s)     Golimumab (SIMPONI ARIA IV)      lamoTRIgine (LAMICTAL) 100 MG tablet Take one tablet in the morning and take one tablet at lunch. 180 tablet 1   lamoTRIgine (LAMICTAL) 200 MG tablet Take 1 tablet (200 mg total) by mouth at  bedtime. 90 tablet 3   levothyroxine (SYNTHROID) 100 MCG tablet Take 100 mcg by mouth. Takes 100 mg Saturday and Sunday     levothyroxine (SYNTHROID) 112 MCG tablet Take 112 mcg by mouth. Takes days Monday through Friday.     LORazepam (ATIVAN) 1 MG tablet TAKE 1 TABLET(1 MG) BY MOUTH TWICE DAILY AS NEEDED 60 tablet 2   metroNIDAZOLE (METROGEL) 0.75 % gel Apply to face 1-2 times daily 45 g 0   Omega-3 Fatty Acids (FISH OIL) 1200 MG CAPS Take 1 capsule by mouth in the morning and at bedtime.     sodium bicarbonate 650 MG tablet Take 650 mg by mouth 2 (two) times daily.     traZODone (DESYREL) 50 MG tablet Take one to two tablets at bedtime. 90 tablet 3   No current facility-administered medications for this visit.    Medication Side Effects: None  Allergies:  Allergies  Allergen Reactions   Aripiprazole Other (See Comments)    Parkinsonism      Lactose Intolerance (Gi) Diarrhea   Methotrexate Other (See Comments)    Hair loss, severe stomatitis     Cefdinir Diarrhea    Other reaction(s): Diarrhea Yeast infection and fever; negative c diff   Etanercept Other (See Comments)    Headaches     Exemestane Other (See Comments)    Suicidal thoughts with medication     Fluoxetine Other (See Comments)    Parkinsonism   Methylprednisolone Sodium Succ Other (See Comments)    Agitated mania   Epinephrine Palpitations    tachycardia    Nitrofurantoin Nausea And Vomiting and Rash      Other reaction(s): rash, diarrhea Other reaction(s): rash, diarrhea Other reaction(s): rash, diarrhea Other reaction(s): rash, diarrhea Other reaction(s): rash, diarrhea Other reaction(s): rash, diarrhea    Past Medical History:  Diagnosis Date   Bipolar 1 disorder ([place])    Breast cancer ([place])    Chronic diarrhea    loose stools twice a day on average for years   Chronic kidney disease (CKD), stage III (moderate) ([place])    Depression 1987   Family history of breast cancer    Hospitalization  or health care facility admission within last 6 months 04/2019   for fall/seizures   Malignant neoplasm of overlapping sites of left breast in female, estrogen receptor positive ([place]) 03/14/2016   Dx in 09/2014, s/p bilateral mastectomies and ALND, 0/10 LN. 1.4 cm  Grade I invasive lobular, ER and PR +/Her--, Ki 67 <5% Tried anastrozole for one month, but developed suicidal idea   Osteoarthritis, knee 09/24/2019   Xray 09/2019   Parkinson's disease 2012   Personal history of malignant neoplasm of breast    Polyposis of colon    Psoriatic arthritis ([place])    PTSD (post-traumatic stress disorder)    Secondary hyperparathyroidism ([place])    Tardive dyskinesia    Thyroid disease     Past Medical History, Surgical history, Social history, and Family history were reviewed and updated as appropriate.   Please see review of systems for further details on the patient's review from today.   Objective:  Physical Exam:  There were no vitals taken for this visit.  Physical Exam Constitutional:      General: She is not in acute distress. Musculoskeletal:        General: No deformity.  Neurological:     Mental Status: She is alert and oriented to person, place, and time.     Coordination: Coordination normal.  Psychiatric:        Attention and Perception: Attention and perception normal. She does not perceive auditory or visual hallucinations.        Mood and Affect: Mood normal. Mood is not anxious or depressed. Affect is not labile, blunt, angry or inappropriate.        Speech: Speech normal.        Behavior: Behavior normal.        Thought Content: Thought content normal. Thought content is not paranoid or delusional. Thought content does not include homicidal or suicidal ideation. Thought content does not include homicidal or suicidal plan.        Cognition and Memory: Cognition and memory normal.        Judgment: Judgment normal.     Comments: Insight intact     Lab Review:      Component Value Date/Time   NA 142 12/10/2021 1044   NA 142 10/17/2021 0000   K 4.8 12/10/2021 1044   CL 109 12/10/2021 1044   CO2 24 12/10/2021 1044   GLUCOSE 88 12/10/2021 1044   BUN 36 (H) 12/10/2021 1044   BUN 30 (A) 10/17/2021 0000   CREATININE 2.45 (H) 12/10/2021 1044   CREATININE 2.29 (H) 05/22/2021 1044   CALCIUM 9.5 12/10/2021 1044   PROT 7.0 12/10/2021 1044   ALBUMIN 4.4 12/10/2021 1044   AST 14 12/10/2021 1044   ALT 12 12/10/2021 1044   ALKPHOS 122 (H) 12/10/2021 1044   BILITOT 0.7 12/10/2021 1044   GFRNONAA 20 (L) 10/29/2021 1055   GFRAA 25 (L) 09/13/2019 1243       Component Value Date/Time   WBC 9.5 10/29/2021 1055   RBC 4.64 10/29/2021 1055   HGB 14.1 10/29/2021 1055   HCT 44.1 10/29/2021 1055   PLT 214 10/29/2021 1055   MCV 95.0 10/29/2021 1055   MCH 30.4 10/29/2021 1055   MCHC 32.0 10/29/2021 1055   RDW 13.2 10/29/2021 1055   LYMPHSABS 3.2 07/02/2021 1048   MONOABS 0.7 07/02/2021 1048   EOSABS 0.2 07/02/2021 1048   BASOSABS 0.1 07/02/2021 1048    L[place]ium Lvl  Date Value Ref Range Status  05/22/2021 0.7 0.6 - 1.2 mmol/L Final     No results found for: "PHENYTOIN", "PHENOBARB", "VALPROATE", "CBMZ"   .res Assessment: Plan:    Plan:  Therapist - Christina Hussami - [facility]  Trazadone '50mg'$  - 1 to 2 at bedtime Lorazepam '1mg'$  BID for anxiety - may take one tablet extra for severe anxiety symptoms. Taking one at night routinely. Lamictal '200mg'$  hs Lamictal '150mg'$  every morning.  NAC tablets BID - for obsessive thoughts, worry, and rumination - unable to take SSRI's  RTC weekly  Counseled patient regarding potential benefits, risks, and side effects of Lamictal to include potential risk of Stevens-Johnson syndrome. Advised patient to stop taking Lamictal and contact office immediately if rash develops and to seek urgent medical attention if rash is severe and/or spreading quickly.  Discussed potential benefits, risk, and side effects of  benzodiazepines to include potential risk of tolerance and dependence, as well as possible drowsiness. Advised patient not to drive  if experiencing drowsiness and to take lowest possible effective dose to minimize risk of dependence and tolerance.   Diagnoses and all orders for this visit:  Bipolar I disorder ([place])  Generalized anxiety disorder  PTSD (post-traumatic stress disorder)  Insomnia due to other mental disorder     Please see After Visit Summary for patient specific instructions.  Future Appointments  Date Time Provider [place]  12/18/2021 12:00 PM Yocelin Vanlue, Berdie Ogren, NP CP-CP None  12/20/2021  2:00 PM Hussami, Rachel Bo, LCSW [facility] None  12/21/2021  9:30 AM Tobi Bastos, RN [facility] None  12/25/2021 12:00 PM Yechezkel Fertig, Berdie Ogren, NP CP-CP None  12/31/2021  8:45 AM Valora Piccolo Barnabas Lister, RD [place] N[facility]  12/31/2021 11:30 AM Shamleffer, Melanie Crazier, MD [facility] None  01/01/2022 12:00 PM Lakeva Hollon, Berdie Ogren, NP CP-CP None  01/08/2022 12:00 PM Rinoa Garramone, Berdie Ogren, NP CP-CP None  01/10/2022  2:00 PM Hussami, Christina R, LCSW [facility] None  01/29/2022  2:00 PM Hussami, Christina R, LCSW [facility] None  04/15/2022  1:45 PM [facility] HEALTH COACH [facility] P[facility]    No orders of the defined types were placed in this encounter.   -------------------------------

## 2021-12-12 ENCOUNTER — Encounter: Payer: Self-pay | Admitting: Physician Assistant

## 2021-12-18 ENCOUNTER — Ambulatory Visit: Payer: Medicare Other | Admitting: Adult Health

## 2021-12-18 ENCOUNTER — Encounter: Payer: Self-pay | Admitting: Physician Assistant

## 2021-12-20 ENCOUNTER — Encounter: Payer: Self-pay | Admitting: Adult Health

## 2021-12-20 ENCOUNTER — Ambulatory Visit (INDEPENDENT_AMBULATORY_CARE_PROVIDER_SITE_OTHER): Payer: Medicare Other | Admitting: Adult Health

## 2021-12-20 ENCOUNTER — Ambulatory Visit (INDEPENDENT_AMBULATORY_CARE_PROVIDER_SITE_OTHER): Payer: Medicare Other | Admitting: Licensed Clinical Social Worker

## 2021-12-20 DIAGNOSIS — F411 Generalized anxiety disorder: Secondary | ICD-10-CM

## 2021-12-20 DIAGNOSIS — F319 Bipolar disorder, unspecified: Secondary | ICD-10-CM

## 2021-12-20 DIAGNOSIS — F4310 Post-traumatic stress disorder, unspecified: Secondary | ICD-10-CM | POA: Diagnosis not present

## 2021-12-20 DIAGNOSIS — F5105 Insomnia due to other mental disorder: Secondary | ICD-10-CM

## 2021-12-20 DIAGNOSIS — F99 Mental disorder, not otherwise specified: Secondary | ICD-10-CM

## 2021-12-20 NOTE — Progress Notes (Signed)
THERAPIST PROGRESS NOTE  Session Time: 2-3p  [place] in office visit for patient and LCSW clinician  Participation Level: Active  Behavioral Response: Neat and Well GroomedAlertAnxious and Depressed  Type of Therapy: Individual Therapy  Treatment Goals addressed: Problem: Bipolar Disorder  Goal: Decrease depressive/manic symptoms and return to improved levels of effective functioning per pt self report 3 out of 5 sessions documented.   Outcome: Progressing Intervention: Discuss self-management skills Note: Allowed pt to identify current self management skills   Problem: Depression Goal:  Decrease depressive symptoms and improve levels of effective functioning-pt reports a decrease in overall depression symptoms 3 out of 5 sessions documented.  Outcome: Progressing Goal: Develop healthy thinking patterns and beliefs about self, others, and the world that lead to the alleviation and help prevent the relapse of depression per self report 3 out of 5 sessions documented.   Outcome: Progressing Intervention: Encourage verbalization of feelings/concerns/expectations Note: Allowed pt to explore and express Intervention: Encourage self-care activities Note: Reviewed  ProgressTowards Goals: Progressing  Interventions: CBT, Solution Focused, and Supportive  Summary: JENICE LEINER is a 71 y.o. female who presents with symptoms related to bipolar disorder. PT reports that she has felt depressed, anxious, and is worried about her overall health.  Allowed pt to explore and express thoughts and feelings associated with recent life situations and external stressors. Patient reports that she is noticing an improvement in her overall symptoms since her last visit. Patient reports that she feels that "it's only hypomania" and that the improvement in her mood is not real. Patient reports that since her last visit she has changed medications, physicians, and her overall  mood has changed for the better. Patient reports that she's noticed that she continues to "cycle rapidly" and that she is very self aware, and feels in control of herself.  Allowed patient to identify times where she felt out of control--reviewed emotion regulation skills with patient. Discussed treatment of trauma--encouraged patient to read the book "the body keeps the score" and to research about EMDR.  LCSW clinician allow patient to explore EMDR equipment that this clinician uses, and explain to her how it works.  Patient continues to have significant concerns about her overall progression of kidney disease, and how this was a very sudden diagnosis for her. Patient initially thought that she was at the end of her life, but now recognizes that there are other ways of treating this, and patient is willing to cooperate with all recommendations.  Allowed patient to identify coping skills that are helpful on days that she's feeling more depressed, on days that if she is feeling more anxious and irritable.   Continued recommendations are as follows: self care behaviors, positive social engagements, focusing on overall work/home/life balance, and focusing on positive physical and emotional wellness.   Pt presents with very pleasant and positive affect at time of session.   Suicidal/Homicidal: No  Therapist Response: Pt is continuing to apply interventions learned in session into daily life situations. Pt is currently on track to meet goals utilizing interventions mentioned above. Personal growth and progress noted. Treatment to continue as indicated.    Plan: Return again in 3 weeks.  Encounter Diagnoses  Name Primary?   Bipolar I disorder ([place]) Yes   PTSD (post-traumatic stress disorder)    Generalized anxiety disorder     Collaboration of Care: Other Pt encouraged to continue services with psychiatrist of record--Regina Mozingo.   Patient/Guardian was advised Release of Information must  be obtained prior to any record release in order to collaborate their care with an outside provider. Patient/Guardian was advised if they have not already done so to contact the registration department to sign all necessary forms in order for [place] to release information regarding their care.   Consent: Patient/Guardian gives verbal consent for treatment and assignment of benefits for services provided during this visit. Patient/Guardian expressed understanding and agreed to proceed.   [place], LCSW 12/20/2021

## 2021-12-20 NOTE — Plan of Care (Signed)
  Problem: Bipolar Disorder  Goal: Decrease depressive/manic symptoms and return to improved levels of effective functioning per pt self report 3 out of 5 sessions documented.   Outcome: Progressing Intervention: Discuss self-management skills Note: Allowed pt to identify current self management skills   Problem: Depression Goal:  Decrease depressive symptoms and improve levels of effective functioning-pt reports a decrease in overall depression symptoms 3 out of 5 sessions documented.  Outcome: Progressing Goal: Develop healthy thinking patterns and beliefs about self, others, and the world that lead to the alleviation and help prevent the relapse of depression per self report 3 out of 5 sessions documented.   Outcome: Progressing Intervention: Encourage verbalization of feelings/concerns/expectations Note: Allowed pt to explore and express Intervention: Encourage self-care activities Note: Reviewed

## 2021-12-20 NOTE — Progress Notes (Signed)
Tiffany [name] [id number] [birth year] 71 y.o.  Subjective:   Patient ID:  Tiffany [name] is a 71 y.o. (DOB 09/15/1950) female.  Chief Complaint: No chief complaint on file.   HPI  Tiffany [name] presents to the office today for follow-up of PTSD, insomnia, GAD and  BPD 1.  Accompanied by husband.  Describes mood today as "ok". Pleasant. Reports some tearfulness. Mood symptoms - denies depression. Reports anxiety and irritability - "every day". Reports some worry and rumination. Reports mood is variable - "cycling in and out of mania several times a dat". Stating "I'm doing ok". Recent endocrinology visit - thyroid medication adjusted. Tiffany [name] - therapist - discussing EMDR. Followed by nephrology. Varying interest and motivation. Taking medications as prescribed. Energy levels stable. Active, does not have a regular exercise routine with physical limitations Enjoys some usual interests and activities. Married. Lives with husband their daughter. Talking to family and friends.  Appetite improved. Weight is variable - 201 pounds Sleep has declined over past week. Averages 6.5 to 7 hours. Focus and concentration varies - getting things done. Completing tasks. Managing some aspects of household. Retired.  Denies SI.  Denies HI.  Denies AH Denies VH  Denies self harm. Denies substance use.  Working with P/T   Previous medications: Celexa, Zyprexa, Tegretol, Depakote, Serzone, Topamax, Seroquel, Effexor, Lexapro, Desipramine, Neurontin, Abilify, Geodon, Propanolol, Cymbalta, Cogentin, Trihexyphenadyl, Sinmmet, Provigil, Selegiline, Requip, Amantadine, Prozac, Mirapex, Azilect, Metoclopramide, Baclofen, Artane, Namenda, Latuda -SOB and heart palpitations.     [place] Office Visit from 02/16/2021 in [facility] Visit from 05/14/2019 in [facility]  Total GAD-7 Score 13 [facility]  Visit from 06/07/2019 in [facility]  Total Score (max 30 points ) 29      PHQ2-9    [place] from 11/30/2021 in [facility] Coordination Nutrition from 11/06/2021 [facility] Visit from 07/30/2021 in [facility] Visit from 07/02/2021 in [facility] from 04/02/2021 in [facility]  PHQ-2 Total Score 0 0 '2 1 1  '$ PHQ-9 Total Score -- -- '11 6 1      '$ Flowsheet Row ED from 11/26/2021 in [facility] Clinical Support from 04/02/2021 in [facility] from 03/21/2021 in [facility]  C-SSRS RISK CATEGORY No Risk No Risk No Risk        Review of Systems:  Review of Systems  Musculoskeletal:  Negative for gait problem.  Neurological:  Negative for tremors.  Psychiatric/Behavioral:         Please refer to HPI    Medications: I have reviewed the patient's current medications.  Current Outpatient Medications  Medication Sig Dispense Refill   Acetylcysteine (N-ACETYL CYSTEINE) 600 MG CAPS 1 capsule Orally Twice a day     aMILoride (MIDAMOR) 5 MG tablet Take 5 mg by mouth every morning.     atorvastatin (LIPITOR) 10 MG tablet Take 1 tablet (10 mg total) by mouth daily. 90 tablet 1   Azelaic Acid 15 % gel Apply topically as needed.     B Complex Vitamins (VITAMIN B COMPLEX) TABS 1 tablet Orally Once daily     benzonatate (TESSALON) 100 MG capsule as needed.     betamethasone dipropionate 1.54 % cream 1 application Externally Twice a  day     Cholecalciferol 50 MCG (2000 UT) TABS 1 tablet Orally Once a day for 30 day(s)     Golimumab (SIMPONI ARIA IV)      lamoTRIgine (LAMICTAL) 100 MG tablet Take one tablet in the morning and take one tablet at lunch. 180 tablet 1   lamoTRIgine (LAMICTAL) 200 MG tablet Take 1 tablet (200 mg  total) by mouth at bedtime. 90 tablet 3   levothyroxine (SYNTHROID) 100 MCG tablet Take 100 mcg by mouth. Takes 100 mg Saturday and Sunday     levothyroxine (SYNTHROID) 112 MCG tablet Take 112 mcg by mouth. Takes days Monday through Friday.     LORazepam (ATIVAN) 1 MG tablet TAKE 1 TABLET(1 MG) BY MOUTH TWICE DAILY AS NEEDED 60 tablet 2   metroNIDAZOLE (METROGEL) 0.75 % gel Apply to face 1-2 times daily 45 g 0   Omega-3 Fatty Acids (FISH OIL) 1200 MG CAPS Take 1 capsule by mouth in the morning and at bedtime.     sodium bicarbonate 650 MG tablet Take 650 mg by mouth 2 (two) times daily.     traZODone (DESYREL) 50 MG tablet Take one to two tablets at bedtime. 90 tablet 3   No current facility-administered medications for this visit.    Medication Side Effects: None  Allergies:  Allergies  Allergen Reactions   Aripiprazole Other (See Comments)    Parkinsonism      Lactose Intolerance (Gi) Diarrhea   Methotrexate Other (See Comments)    Hair loss, severe stomatitis     Cefdinir Diarrhea    Other reaction(s): Diarrhea Yeast infection and fever; negative c diff   Etanercept Other (See Comments)    Headaches     Exemestane Other (See Comments)    Suicidal thoughts with medication     Fluoxetine Other (See Comments)    Parkinsonism   Methylprednisolone Sodium Succ Other (See Comments)    Agitated mania   Epinephrine Palpitations    tachycardia    Nitrofurantoin Nausea And Vomiting and Rash      Other reaction(s): rash, diarrhea Other reaction(s): rash, diarrhea Other reaction(s): rash, diarrhea Other reaction(s): rash, diarrhea Other reaction(s): rash, diarrhea Other reaction(s): rash, diarrhea    Past Medical History:  Diagnosis Date   Bipolar 1 disorder ([place])    Breast cancer ([place])    Chronic diarrhea    loose stools twice a day on average for years   Chronic kidney disease (CKD), stage III (moderate) ([place])    Depression 1987   Family history of breast cancer     Hospitalization or health care facility admission within last 6 months 04/2019   for fall/seizures   Malignant neoplasm of overlapping sites of left breast in female, estrogen receptor positive ([place]) 03/14/2016   Dx in 09/2014, s/p bilateral mastectomies and ALND, 0/10 LN. 1.4 cm  Grade I invasive lobular, ER and PR +/Her--, Ki 67 <5% Tried anastrozole for one month, but developed suicidal idea   Osteoarthritis, knee 09/24/2019   Xray 09/2019   Parkinson's disease 2012   Personal history of malignant neoplasm of breast    Polyposis of colon    Psoriatic arthritis ([place])    PTSD (post-traumatic stress disorder)    Secondary hyperparathyroidism ([place])    Tardive dyskinesia    Thyroid disease     Past Medical History, Surgical history, Social history, and Family history were reviewed and updated as appropriate.   Please see review of systems for further details on the patient's review  from today.   Objective:   Physical Exam:  There were no vitals taken for this visit.  Physical Exam Constitutional:      General: She is not in acute distress. Musculoskeletal:        General: No deformity.  Neurological:     Mental Status: She is alert and oriented to person, place, and time.     Coordination: Coordination normal.  Psychiatric:        Attention and Perception: Attention and perception normal. She does not perceive auditory or visual hallucinations.        Mood and Affect: Mood normal. Mood is not anxious or depressed. Affect is not labile, blunt, angry or inappropriate.        Speech: Speech normal.        Behavior: Behavior normal.        Thought Content: Thought content normal. Thought content is not paranoid or delusional. Thought content does not include homicidal or suicidal ideation. Thought content does not include homicidal or suicidal plan.        Cognition and Memory: Cognition and memory normal.        Judgment: Judgment normal.     Comments: Insight intact     Lab  Review:     Component Value Date/Time   NA 142 12/10/2021 1044   NA 142 10/17/2021 0000   K 4.8 12/10/2021 1044   CL 109 12/10/2021 1044   CO2 24 12/10/2021 1044   GLUCOSE 88 12/10/2021 1044   BUN 36 (H) 12/10/2021 1044   BUN 30 (A) 10/17/2021 0000   CREATININE 2.45 (H) 12/10/2021 1044   CREATININE 2.29 (H) 05/22/2021 1044   CALCIUM 9.5 12/10/2021 1044   PROT 7.0 12/10/2021 1044   ALBUMIN 4.4 12/10/2021 1044   AST 14 12/10/2021 1044   ALT 12 12/10/2021 1044   ALKPHOS 122 (H) 12/10/2021 1044   BILITOT 0.7 12/10/2021 1044   GFRNONAA 20 (L) 10/29/2021 1055   GFRAA 25 (L) 09/13/2019 1243       Component Value Date/Time   WBC 9.5 10/29/2021 1055   RBC 4.64 10/29/2021 1055   HGB 14.1 10/29/2021 1055   HCT 44.1 10/29/2021 1055   PLT 214 10/29/2021 1055   MCV 95.0 10/29/2021 1055   MCH 30.4 10/29/2021 1055   MCHC 32.0 10/29/2021 1055   RDW 13.2 10/29/2021 1055   LYMPHSABS 3.2 07/02/2021 1048   MONOABS 0.7 07/02/2021 1048   EOSABS 0.2 07/02/2021 1048   BASOSABS 0.1 07/02/2021 1048    Lithium Lvl  Date Value Ref Range Status  05/22/2021 0.7 0.6 - 1.2 mmol/L Final     No results found for: "PHENYTOIN", "PHENOBARB", "VALPROATE", "CBMZ"   .res Assessment: Plan:    Plan:  Therapist - Christina Hussami - [facility]  Trazadone '50mg'$  - 1 to 2 at bedtime Lorazepam '1mg'$  BID for anxiety - may take one tablet extra for severe anxiety symptoms. Taking one at night routinely. Lamictal '200mg'$  hs Lamictal '150mg'$  every morning.  NAC tablets BID - for obsessive thoughts, worry, and rumination - unable to take SSRI's  RTC weekly  Seeing Endocrinologist on 10/23  Counseled patient regarding potential benefits, risks, and side effects of Lamictal to include potential risk of Stevens-Johnson syndrome. Advised patient to stop taking Lamictal and contact office immediately if rash develops and to seek urgent medical attention if rash is severe and/or spreading quickly.  Discussed  potential benefits, risk, and side effects of benzodiazepines to include potential risk of tolerance  and dependence, as well as possible drowsiness. Advised patient not to drive if experiencing drowsiness and to take lowest possible effective dose to minimize risk of dependence and tolerance.  Diagnoses and all orders for this visit:  Bipolar I disorder ([place])  Generalized anxiety disorder  PTSD (post-traumatic stress disorder)  Insomnia due to other mental disorder     Please see After Visit Summary for patient specific instructions.  Future Appointments  Date Time Provider [place]  12/20/2021  2:00 PM Hussami, Rachel Bo, LCSW [facility] None  12/21/2021  9:30 AM Tobi Bastos, RN [facility] None  12/25/2021 12:00 PM Cledis Sohn, Berdie Ogren, NP [facility] None  12/28/2021  1:45 PM Carney Living, PT [facility]  12/31/2021  8:45 AM Valora Piccolo Barnabas Lister, RD [place] NDM  12/31/2021 11:30 AM Shamleffer, Melanie Crazier, MD [facility] None  01/01/2022 12:00 PM Rochelle Nephew, Berdie Ogren, NP [facility] None  01/04/2022  1:45 PM Carney Living, PT [facility]  01/08/2022 12:00 PM Ludwin Flahive, Berdie Ogren, NP [facility] None  01/10/2022  2:00 PM Hussami, Christina R, LCSW [facility] None  01/29/2022  2:00 PM Hussami, Christina R, LCSW [facility] None  04/15/2022  1:45 PM [facility] HEALTH COACH [facility]    No orders of the defined types were placed in this encounter.   -------------------------------

## 2021-12-21 ENCOUNTER — Encounter: Payer: Self-pay | Admitting: *Deleted

## 2021-12-21 ENCOUNTER — Ambulatory Visit: Payer: Self-pay | Admitting: *Deleted

## 2021-12-21 NOTE — Patient Outreach (Addendum)
  Care Coordination   Follow Up Visit Note   12/21/2021 Name: Tiffany [name] MRN: [id number] DOB: [birth year]  Tiffany [name] is a 71 y.o. year old female who sees [facility], [place], [place] for primary care. I spoke with  Tiffany [name] by phone today.  What matters to the patients health and wellness today?  MyChart    Goals Addressed               This Visit's Progress     COMPLETED: Pain management/ MyChart access (pt-stated)        Update: Pt reports her pain is managed at this time with no pain over several days utilizing the pain management techniques discussed from the last assessment on 9/22. States several events are occurring with a new endocrinologist and nephrologist along with some changes with her medications as reviewed and noted. Continue to have sufficient transportation and a support system with daughter and spouse.  All pending appointments reviewed. No additional needs presented at this time as pt continues to manage her ongoing medical issues accordingly. Pt aware of who to call with any major changes related to her bi-polar for counseling and/pain management with her primary provider. Based upon the result and improved management of care with no additional needs will closed this case at this time.  Pt was not able to access her MyChart. Therefore will mail today's assessment along with the MyChart information to assist with getting access to her MyChart information. Pt is also aware she can reach out to her provider's office to reach this RN case manager for any additional needs. Pt very appreciative for the assistance provider over the last month for case management services.        SDOH assessments and interventions completed:  Yes  SDOH Interventions Today    Flowsheet Row Most Recent Value  SDOH Interventions   Food Insecurity Interventions Intervention Not Indicated  Housing Interventions Intervention Not Indicated  Transportation Interventions Intervention Not  Indicated  Utilities Interventions Intervention Not Indicated        Care Coordination Interventions Activated:  Yes  Care Coordination Interventions:  Yes, provided   Follow up plan: No further intervention required.   Encounter Outcome:  Pt. Visit Completed   Raina Mina, RN Care Management Coordinator [place] Office [phone]

## 2021-12-21 NOTE — Patient Instructions (Signed)
Visit Information  Thank you for taking time to visit with me today. Please don't hesitate to contact me if I can be of assistance to you.   Following are the goals we discussed today:   Goals Addressed               This Visit's Progress     COMPLETED: Pain management/ MyChart access (pt-stated)        Update: Pt reports her pain is managed at this time with no pain over several days utilizing the pain management techniques discussed from the last assessment on 9/22. States several events are occurring with a new endocrinologist and nephrologist along with some changes with her medications as reviewed and noted. Continue to have sufficient transportation and a support system with daughter and spouse.  All pending appointments reviewed. No additional needs presented at this time as pt continues to manage her ongoing medical issues accordingly. Pt aware of who to call with any major changes related to her bi-polar for counseling and/pain management with her primary provider. Based upon the result and improved management of care with no additional needs will closed this case at this time.  Pt was not able to access her MyChart. Therefore will mail today's assessment along with the MyChart information to assist with getting access to her MyChart information. Pt is also aware she can reach out to her provider's office to reach this RN case manager for any additional needs. Pt very appreciative for the assistance provider over the last month for case management services.        Please call the care guide team at [phone] if you need to cancel or reschedule your appointment.   If you are experiencing a Mental Health or [place] or need someone to talk to, please call the Suicide and Crisis Lifeline: 988 call the [place] National Suicide Prevention Lifeline: 339 285 4557 or TTY: 952-779-2611 TTY 817 861 2365) to talk to a trained counselor call 1-800-273-TALK (toll free, 24  hour hotline)  The patient verbalized understanding of instructions, educational materials, and care plan provided today and agreed to receive a mailed copy of patient instructions, educational materials, and care plan.   No further follow up required: No additional needs at this time  Raina Mina, RN Care Management Coordinator [place] Office [phone]

## 2021-12-25 ENCOUNTER — Ambulatory Visit (INDEPENDENT_AMBULATORY_CARE_PROVIDER_SITE_OTHER): Payer: Medicare Other | Admitting: Adult Health

## 2021-12-25 ENCOUNTER — Encounter: Payer: Self-pay | Admitting: Adult Health

## 2021-12-25 DIAGNOSIS — F411 Generalized anxiety disorder: Secondary | ICD-10-CM

## 2021-12-25 DIAGNOSIS — F5105 Insomnia due to other mental disorder: Secondary | ICD-10-CM | POA: Diagnosis not present

## 2021-12-25 DIAGNOSIS — F99 Mental disorder, not otherwise specified: Secondary | ICD-10-CM

## 2021-12-25 DIAGNOSIS — F4310 Post-traumatic stress disorder, unspecified: Secondary | ICD-10-CM | POA: Diagnosis not present

## 2021-12-25 DIAGNOSIS — F319 Bipolar disorder, unspecified: Secondary | ICD-10-CM | POA: Diagnosis not present

## 2021-12-25 NOTE — Progress Notes (Signed)
Tiffany [name] [id number] June 05, 1950 71 y.o.  Subjective:   Patient ID:  Tiffany [name] is a 71 y.o. (DOB [birth year]) female.  Chief Complaint: No chief complaint on file.   HPI Tiffany [name] presents to the office today for follow-up of PTSD, insomnia, GAD and  BPD 1.  Accompanied by husband.  Describes mood today as "ok". Pleasant. Reports some tearfulness. Mood symptoms - reports "some" depression and anxiety. Reports increased irritability at times. Reports some worry and rumination. Mood is lower - no longer cycling. Stating "I'm doing ok". Feels like medications are helpful. Upcoming endocrinology visit - new provider. Earlie Server - therapist. Followed by nephrology. Varying interest and motivation. Taking medications as prescribed. Energy levels stable. Active, does not have a regular exercise routine with physical limitations Enjoys some usual interests and activities. Married. Lives with husband their daughter. Talking to family and friends.  Appetite improved. Weight is variable - 201 pounds Sleep is variable. Averages 6.5 to 7 hours of broken sleep. Focus and concentration varies - getting things done. Completing tasks. Managing some aspects of household. Retired.  Denies SI.  Denies HI.  Denies AH Denies VH  Denies self harm. Denies substance use.  Working with P/T   Previous medications: Celexa, Zyprexa, Tegretol, Depakote, Serzone, Topamax, Seroquel, Effexor, Lexapro, Desipramine, Neurontin, Abilify, Geodon, Propanolol, Cymbalta, Cogentin, Trihexyphenadyl, Sinmmet, Provigil, Selegiline, Requip, Amantadine, Prozac, Mirapex, Azilect, Metoclopramide, Baclofen, Artane, Namenda, Latuda -SOB and heart palpitations.    [facility] Office Visit from 02/16/2021 in [facility] Visit from 05/14/2019 in [facility]  Total GAD-7 Score 13 New Site Office Visit from 06/07/2019 in [facility]  Total Score (max 30 points ) 29      PHQ2-9    [place] from 11/30/2021 in [facility] Coordination Nutrition from 11/06/2021 [facility] Office Visit from 07/30/2021 in [facility] Visit from 07/02/2021 in [facility] from 04/02/2021 in [facility]  PHQ-2 Total Score 0 0 '2 1 1  '$ PHQ-9 Total Score -- -- '11 6 1      '$ Flowsheet Row ED from 11/26/2021 in [facility] Clinical Support from 04/02/2021 in [facility] from 03/21/2021 in [facility]  C-SSRS RISK CATEGORY No Risk No Risk No Risk        Review of Systems:  Review of Systems  Musculoskeletal:  Negative for gait problem.  Neurological:  Negative for tremors.  Psychiatric/Behavioral:         Please refer to HPI    Medications: I have reviewed the patient's current medications.  Current Outpatient Medications  Medication Sig Dispense Refill   Acetylcysteine (N-ACETYL CYSTEINE) 600 MG CAPS 1 capsule Orally Twice a day     aMILoride (MIDAMOR) 5 MG tablet Take 5 mg by mouth every morning.     atorvastatin (LIPITOR) 10 MG tablet Take 1 tablet (10 mg total) by mouth daily. 90 tablet 1   Azelaic Acid 15 % gel Apply topically as needed.     B Complex Vitamins (VITAMIN B COMPLEX) TABS 1 tablet Orally Once daily     benzonatate (TESSALON) 100 MG capsule as needed.     betamethasone dipropionate 7.35 % cream 1 application Externally Twice a day     Cholecalciferol 50 MCG (  2000 UT) TABS 1 tablet Orally Once a day for 30 day(s)     Golimumab (SIMPONI ARIA IV)      lamoTRIgine (LAMICTAL) 100 MG tablet Take one tablet in the morning and take one tablet at lunch. 180 tablet 1   lamoTRIgine (LAMICTAL) 200 MG tablet Take 1 tablet (200 mg total) by mouth at bedtime. 90  tablet 3   levothyroxine (SYNTHROID) 100 MCG tablet Take 100 mcg by mouth. Takes 100 mg Saturday and Sunday     levothyroxine (SYNTHROID) 112 MCG tablet Take 112 mcg by mouth. Takes days Monday through Friday.     LORazepam (ATIVAN) 1 MG tablet TAKE 1 TABLET(1 MG) BY MOUTH TWICE DAILY AS NEEDED 60 tablet 2   metroNIDAZOLE (METROGEL) 0.75 % gel Apply to face 1-2 times daily 45 g 0   Omega-3 Fatty Acids (FISH OIL) 1200 MG CAPS Take 1 capsule by mouth in the morning and at bedtime.     sodium bicarbonate 650 MG tablet Take 650 mg by mouth 2 (two) times daily.     traZODone (DESYREL) 50 MG tablet Take one to two tablets at bedtime. 90 tablet 3   No current facility-administered medications for this visit.    Medication Side Effects: None  Allergies:  Allergies  Allergen Reactions   Aripiprazole Other (See Comments)    Parkinsonism      Lactose Intolerance (Gi) Diarrhea   Methotrexate Other (See Comments)    Hair loss, severe stomatitis     Cefdinir Diarrhea    Other reaction(s): Diarrhea Yeast infection and fever; negative c diff   Etanercept Other (See Comments)    Headaches     Exemestane Other (See Comments)    Suicidal thoughts with medication     Fluoxetine Other (See Comments)    Parkinsonism   Methylprednisolone Sodium Succ Other (See Comments)    Agitated mania   Epinephrine Palpitations    tachycardia    Nitrofurantoin Nausea And Vomiting and Rash      Other reaction(s): rash, diarrhea Other reaction(s): rash, diarrhea Other reaction(s): rash, diarrhea Other reaction(s): rash, diarrhea Other reaction(s): rash, diarrhea Other reaction(s): rash, diarrhea    Past Medical History:  Diagnosis Date   Bipolar 1 disorder ([place])    Breast cancer ([place])    Chronic diarrhea    loose stools twice a day on average for years   Chronic kidney disease (CKD), stage III (moderate) ([place])    Depression 1987   Family history of breast cancer    Hospitalization or health  care facility admission within last 6 months 04/2019   for fall/seizures   Malignant neoplasm of overlapping sites of left breast in female, estrogen receptor positive ([place]) 03/14/2016   Dx in 09/2014, s/p bilateral mastectomies and ALND, 0/10 LN. 1.4 cm  Grade I invasive lobular, ER and PR +/Her--, Ki 67 <5% Tried anastrozole for one month, but developed suicidal idea   Osteoarthritis, knee 09/24/2019   Xray 09/2019   Parkinson's disease 2012   Personal history of malignant neoplasm of breast    Polyposis of colon    Psoriatic arthritis ([place])    PTSD (post-traumatic stress disorder)    Secondary hyperparathyroidism ([place])    Tardive dyskinesia    Thyroid disease     Past Medical History, Surgical history, Social history, and Family history were reviewed and updated as appropriate.   Please see review of systems for further details on the patient's review from today.   Objective:   Physical  Exam:  There were no vitals taken for this visit.  Physical Exam Constitutional:      General: She is not in acute distress. Musculoskeletal:        General: No deformity.  Neurological:     Mental Status: She is alert and oriented to person, place, and time.     Coordination: Coordination normal.  Psychiatric:        Attention and Perception: Attention and perception normal. She does not perceive auditory or visual hallucinations.        Mood and Affect: Mood normal. Mood is not anxious or depressed. Affect is not labile, blunt, angry or inappropriate.        Speech: Speech normal.        Behavior: Behavior normal.        Thought Content: Thought content normal. Thought content is not paranoid or delusional. Thought content does not include homicidal or suicidal ideation. Thought content does not include homicidal or suicidal plan.        Cognition and Memory: Cognition and memory normal.        Judgment: Judgment normal.     Comments: Insight intact     Lab Review:     Component Value  Date/Time   NA 142 12/10/2021 1044   NA 142 10/17/2021 0000   K 4.8 12/10/2021 1044   CL 109 12/10/2021 1044   CO2 24 12/10/2021 1044   GLUCOSE 88 12/10/2021 1044   BUN 36 (H) 12/10/2021 1044   BUN 30 (A) 10/17/2021 0000   CREATININE 2.45 (H) 12/10/2021 1044   CREATININE 2.29 (H) 05/22/2021 1044   CALCIUM 9.5 12/10/2021 1044   PROT 7.0 12/10/2021 1044   ALBUMIN 4.4 12/10/2021 1044   AST 14 12/10/2021 1044   ALT 12 12/10/2021 1044   ALKPHOS 122 (H) 12/10/2021 1044   BILITOT 0.7 12/10/2021 1044   GFRNONAA 20 (L) 10/29/2021 1055   GFRAA 25 (L) 09/13/2019 1243       Component Value Date/Time   WBC 9.5 10/29/2021 1055   RBC 4.64 10/29/2021 1055   HGB 14.1 10/29/2021 1055   HCT 44.1 10/29/2021 1055   PLT 214 10/29/2021 1055   MCV 95.0 10/29/2021 1055   MCH 30.4 10/29/2021 1055   MCHC 32.0 10/29/2021 1055   RDW 13.2 10/29/2021 1055   LYMPHSABS 3.2 07/02/2021 1048   MONOABS 0.7 07/02/2021 1048   EOSABS 0.2 07/02/2021 1048   BASOSABS 0.1 07/02/2021 1048    L[place]ium Lvl  Date Value Ref Range Status  05/22/2021 0.7 0.6 - 1.2 mmol/L Final     No results found for: "PHENYTOIN", "PHENOBARB", "VALPROATE", "CBMZ"   .res Assessment: Plan:    Plan:  Therapist - Christina Hussami - [facility]  Trazadone '50mg'$  - 1 to 2 at bedtime Lorazepam '1mg'$  BID for anxiety - may take one tablet extra for severe anxiety symptoms. Taking one at night routinely. Lamictal '200mg'$  hs Lamictal '150mg'$  every morning.  Consider low dose of Gabapentin.  NAC tablets BID - for obsessive thoughts, worry, and rumination - unable to take SSRI's  RTC weekly  Seeing Endocrinologist on 10/23  Counseled patient regarding potential benefits, risks, and side effects of Lamictal to include potential risk of Stevens-Johnson syndrome. Advised patient to stop taking Lamictal and contact office immediately if rash develops and to seek urgent medical attention if rash is severe and/or spreading quickly.  Discussed  potential benefits, risk, and side effects of benzodiazepines to include potential risk of tolerance and dependence,  as well as possible drowsiness. Advised patient not to drive if experiencing drowsiness and to take lowest possible effective dose to minimize risk of dependence and tolerance.  There are no diagnoses linked to this encounter.   Please see After Visit Summary for patient specific instructions.  Future Appointments  Date Time Provider [place]  12/28/2021  1:45 PM Carney Living, PT [facility]  12/31/2021  8:45 AM Valora Piccolo Barnabas Lister, RD [place] [facility]  12/31/2021 11:30 AM Shamleffer, Melanie Crazier, MD [facility] None  01/01/2022 12:00 PM Dalyn Kjos, Berdie Ogren, NP [facility] None  01/04/2022  1:45 PM Carney Living, PT [facility]  01/08/2022 12:00 PM Jamaree Hosier, Berdie Ogren, NP [facility] None  01/10/2022  2:00 PM Hussami, Rachel Bo, LCSW [facility] None  01/29/2022  2:00 PM Hussami, Christina R, LCSW [facility] None  02/13/2022  2:00 PM Hussami, Christina R, LCSW [facility] None  04/15/2022  1:45 PM [facility] HEALTH COACH [facility]    No orders of the defined types were placed in this encounter.   -------------------------------

## 2021-12-26 ENCOUNTER — Encounter: Payer: Self-pay | Admitting: Physician Assistant

## 2021-12-28 ENCOUNTER — Ambulatory Visit (HOSPITAL_BASED_OUTPATIENT_CLINIC_OR_DEPARTMENT_OTHER): Payer: Medicare Other | Attending: Physician Assistant | Admitting: Physical Therapy

## 2021-12-28 ENCOUNTER — Encounter (HOSPITAL_BASED_OUTPATIENT_CLINIC_OR_DEPARTMENT_OTHER): Payer: Self-pay | Admitting: Physical Therapy

## 2021-12-28 DIAGNOSIS — R2681 Unsteadiness on feet: Secondary | ICD-10-CM | POA: Insufficient documentation

## 2021-12-28 DIAGNOSIS — R2689 Other abnormalities of gait and mobility: Secondary | ICD-10-CM | POA: Insufficient documentation

## 2021-12-28 DIAGNOSIS — M25561 Pain in right knee: Secondary | ICD-10-CM | POA: Insufficient documentation

## 2021-12-28 DIAGNOSIS — M6281 Muscle weakness (generalized): Secondary | ICD-10-CM | POA: Diagnosis present

## 2021-12-28 DIAGNOSIS — G8929 Other chronic pain: Secondary | ICD-10-CM | POA: Diagnosis present

## 2021-12-28 NOTE — Therapy (Signed)
OUTPATIENT PHYSICAL THERAPY LOWER EXTREMITY TREATMENT/Progress Note    Patient Name: Tiffany [name] MRN: [id number] DOB:Apr 27, 1950, 71 y.o., female Today's Date: 12/30/2021   PT End of Session - 12/30/21 1933     Visit Number 10    Number of Visits 13    Date for PT Re-Evaluation 02/08/22    Authorization Type KX right now    PT Start Time 1345    PT Stop Time 1428    PT Time Calculation (min) 43 min    Activity Tolerance Patient tolerated treatment well    Behavior During Therapy [facility] for tasks assessed/performed             Progress Note Reporting Period 11/14/2021 to 12/27/2021  See note below for Objective Data and Assessment of Progress/Goals.       Past Medical History:  Diagnosis Date   Bipolar 1 disorder ([place])    Breast cancer ([place])    Chronic diarrhea    loose stools twice a day on average for years   Chronic kidney disease (CKD), stage III (moderate) ([place])    Depression 1987   Family history of breast cancer    Hospitalization or health care facility admission within last 6 months 04/2019   for fall/seizures   Malignant neoplasm of overlapping sites of left breast in female, estrogen receptor positive ([place]) 03/14/2016   Dx in 09/2014, s/p bilateral mastectomies and ALND, 0/10 LN. 1.4 cm  Grade I invasive lobular, ER and PR +/Her--, Ki 67 <5% Tried anastrozole for one month, but developed suicidal idea   Osteoarthritis, knee 09/24/2019   Xray 09/2019   Parkinson's disease 2012   Personal history of malignant neoplasm of breast    Polyposis of colon    Psoriatic arthritis ([place])    PTSD (post-traumatic stress disorder)    Secondary hyperparathyroidism ([place])    Tardive dyskinesia    Thyroid disease    Past Surgical History:  Procedure Laterality Date   [place] Bilateral 09/19/2014   [place] Bilateral 1974   Patient  Active Problem List   Diagnosis Date Noted   Genetic testing 06/14/2021   Family history of breast cancer 06/05/2021   Osteoarthritis, knee 09/24/2019   CKD (chronic kidney disease) stage 4, GFR 15-29 ml/min ([place]) 09/24/2019   Severe recurrent major depression without psychotic features ([place]) 09/09/2019   Bipolar I disorder, most recent episode depressed ([place])    Transaminitis    Essential hypertension    Encephalopathy 04/26/2019   Leukocytosis 04/21/2019   Thrombocytopenia ([place]) 09/04/2018   Vitamin D deficiency 09/04/2018   Psoriatic arthritis ([place]) 09/04/2018   Tubular adenoma of colon 05/28/2017   History of colonic polyps 05/28/2017   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) 08/16/2004   Acquired hypothyroidism 04/03/1996    PCP: Inda Coke, PA  REFERRING PROVIDER: Inda Coke, PA  REFERRING DIAG:  [phone] - recurrent pain of right knee  THERAPY DIAG:  Other abnormalities of gait and mobility  Unsteadiness on feet  Muscle weakness (generalized)  Rationale for Evaluation and Treatment Rehabilitation  ONSET DATE: 3 weeks ago  SUBJECTIVE:   SUBJECTIVE STATEMENT: The patient is doing better. She is still having whole body pain but it hasn't been bad the last few days. Her knee is the best it has felt.   PERTINENT HISTORY: Bi-polar, CKD, depression, breast cancer, OA, Parkinson's, Psoriatic Arthritis, PTSD, tardive diskenesia,   PAIN:  Are you having pain? Yes: NPRS scale: 6/10 Pain location: right lateral knee Pain description: aching Aggravating factors: standing, walking , twisting Relieving factors:  rest  PRECAUTIONS: None  WEIGHT BEARING RESTRICTIONS No  FALLS:  Has patient fallen in last 6 months? No  LIVING ENVIRONMENT: Lives with husband  OCCUPATION: retired   PLOF: Independent uses an assitvie device at times  PATIENT GOALS   To have less pain in her knee and to get back into the gym   OBJECTIVE:   DIAGNOSTIC FINDINGS:   PATIENT SURVEYS:   COGNITION:  Overall cognitive status: Within functional limits for tasks assessed     SENSATION: WFL   POSTURE: rounded shoulders and forward head  PALPATION: Trigger points in lower IT band   LOWER EXTREMITY ROM:  Active PROM Right eval Left eval Right  9/6   Hip flexion      Hip extension      Hip abduction      Hip adduction      Hip internal rotation      Hip external rotation      Knee flexion 94  106   Knee extension -13  -8   Ankle dorsiflexion      Ankle plantarflexion      Ankle inversion      Ankle eversion       (Blank rows = not tested)  LOWER EXTREMITY MMT:  MMT Right eval Left eval  Hip flexion 16.1 23.1  Hip extension    Hip abduction    Hip adduction 29.7 27.3  Hip internal rotation    Hip external rotation    Knee flexion    Knee extension 25.6 33  Ankle dorsiflexion    Ankle plantarflexion    Ankle inversion    Ankle eversion     (Blank rows = not tested)   GAIT: Decreased single leg stance time on the right    TODAY'S TREATMENT: 10/20  X-fit 5 min reviewed set up Reviewed self set up of nu-step  Leg press 10 lbs 3x15  Hip abduction 3x15   Shoulder row 3x15  Shoulder extension 3x15 10lbs      9/29 Seated march 2x15 yellow  Seated hip abduction 2x15 yellow  Hamstring curl 2x10 yellow   Heel raise x20  Standing march x20    Decompression:   Legs up on a pillow or two -> palms up -> deep breathing for 5-10 minutes  Leg lengthener: push heel towards the wall with deep breathing   Sink into sand: press leg down and breath 5-10 x   Shoulder sink  into the sand 5-10 x       PATIENT EDUCATION:  Education details: reviewed HEP and gym equipment.  Person educated: Patient Education method: Explanation, Demonstration, Tactile cues, Verbal cues, and Handouts Education comprehension: verbalized understanding, returned demonstration, verbal cues required, tactile cues required, and needs further education   HOME EXERCISE PROGRAM: Has past HEP   ASSESSMENT:  CLINICAL IMPRESSION: The patient tolerated treatment well. We continue to review set up of gym equipment. She requires assist and cuing to set up correctly. We talked to her about  the group trnaistion program and individual work with our trainers.   OBJECTIVE IMPAIRMENTS Abnormal gait, decreased activity tolerance, decreased endurance, decreased mobility, difficulty walking, decreased ROM, decreased strength, increased fascial restrictions, and pain.   ACTIVITY LIMITATIONS carrying, bending, standing, squatting, stairs, transfers, and locomotion level  PARTICIPATION LIMITATIONS: cleaning, laundry, shopping, community activity, and yard work  PERSONAL FACTORS 3+ comorbidities: Psoriatic arthritis, Bi-polar parkinsosn  are also affecting patient's functional outcome.   REHAB POTENTIAL: Good  CLINICAL DECISION MAKING: Evolving/moderate complexity  EVALUATION COMPLEXITY: Moderate   GOALS: Goals reviewed with patient? Yes  SHORT TERM GOALS: Target date: 09/09/2021 reviewed on 6/28  Patient will increase gross right LE strength by 5 lbs  Baseline: Goal status: ]not tested today   2.  Patient will be increase knee extension passive by 5 degrees  Baseline:  Goal status: Achieved improved to 08 degrees today   3.  Patient will be independent with basic HEP  Baseline:  Goal status: independent with basic HEP. Advancing back to gym program   LONG TERM GOALS: Target date: 10/11/2021   Patient will stand for 20 min without pain in order to perform her ADL's  Baseline:  Goal  status: INITIAL  2.  Patient will be independent with full gym program  Baseline:  Goal status: INITIAL  3.  Patient will ambulate without increased pain  Baseline:  Goal status: INITIAL    PLAN: PT FREQUENCY: 2x/week  PT DURATION: 8 weeks  PLANNED INTERVENTIONS: Therapeutic exercises, Therapeutic activity, Neuromuscular re-education, Balance training, Gait training, Patient/Family education, Joint mobilization, Stair training, DME instructions, Aquatic Therapy, Dry Needling, Electrical stimulation, Cryotherapy, Moist heat, Ultrasound, and Manual therapy  PLAN FOR NEXT SESSION: Continue manual as indicated, progress to resistive standing exercise, balance/SLS as tolerated   Carney Living, PT 12/30/2021, 7:35 PM

## 2021-12-31 ENCOUNTER — Encounter: Payer: Self-pay | Admitting: Internal Medicine

## 2021-12-31 ENCOUNTER — Ambulatory Visit: Payer: Medicare Other | Admitting: Dietician

## 2021-12-31 ENCOUNTER — Ambulatory Visit (INDEPENDENT_AMBULATORY_CARE_PROVIDER_SITE_OTHER): Payer: Medicare Other | Admitting: Internal Medicine

## 2021-12-31 VITALS — BP 122/74 | HR 55 | Ht 64.0 in | Wt 199.4 lb

## 2021-12-31 DIAGNOSIS — E039 Hypothyroidism, unspecified: Secondary | ICD-10-CM | POA: Diagnosis not present

## 2021-12-31 LAB — T4, FREE: Free T4: 1.2 ng/dL (ref 0.60–1.60)

## 2021-12-31 LAB — TSH: TSH: 0.18 u[IU]/mL — ABNORMAL LOW (ref 0.35–5.50)

## 2021-12-31 NOTE — Progress Notes (Signed)
Name: Tiffany [name]  MRN/ DOB: [id number], [birth year]    Age/ Sex: 71 y.o., female    PCP: Inda Coke, PA   Reason for Endocrinology Evaluation: Hypothyroidism     Date of Initial Endocrinology Evaluation: 12/31/2021     HPI: Tiffany [name] is a 71 y.o. female with a past medical history of Hx of Breast Ca (no XRT no chemo), bipolar disorder, CKD, TD and parkinson's disease. The patient presented for initial endocrinology clinic visit on 12/31/2021 for consultative assistance with her Hypothyroidism.     Most of the history has been obtained from her spouse Pt has been diagnosed with hypothyroidism ~ 30 yrs ago while on Lithium  requiring LT-4 replacement.  She was on lithium until ~ 11/2021 when it was stopped  due to CKD which resulted in lower TSH and fluctuating TFT's    Saw Dr. Chalmers Cater which has reduced levothyroxine to  below dose   Used to have diarrhea but this has resolved months ago but now has constipation  Weight stable  She takes levothyroxine appropriately  No biotin use  Denies palpitations  Denies tremors  Has noted hair loss at vortex   Levothyroxine 100 mcg Monday through Friday   Levothyroxine 112 mcg Saturday and Sunday    Sister with thyroid disease  HISTORY:  Past Medical History:  Past Medical History:  Diagnosis Date   Bipolar 1 disorder ([place])    Breast cancer ([place])    Chronic diarrhea    loose stools twice a day on average for years   Chronic kidney disease (CKD), stage III (moderate) ([place])    Depression 1987   Family history of breast cancer    Hospitalization or health care facility admission within last 6 months 04/2019   for fall/seizures   Malignant neoplasm of overlapping sites of left breast in female, estrogen receptor positive ([place]) 03/14/2016   Dx in 09/2014, s/p bilateral mastectomies and ALND, 0/10 LN. 1.4 cm  Grade I invasive lobular, ER and PR +/Her--, Ki 67 <5% Tried anastrozole for one month, but developed suicidal  idea   Osteoarthritis, knee 09/24/2019   Xray 09/2019   Parkinson's disease 2012   Personal history of malignant neoplasm of breast    Polyposis of colon    Psoriatic arthritis ([place])    PTSD (post-traumatic stress disorder)    Secondary hyperparathyroidism ([place])    Tardive dyskinesia    Thyroid disease    Past Surgical History:  Past Surgical History:  Procedure Laterality Date   Index Bilateral 09/19/2014   Jansen Bilateral 1974    Social History:  reports that she has never smoked. She has never used smokeless tobacco. She reports that she does not drink alcohol and does not use drugs. Family History: family history includes Breast cancer (age of onset: 34) in her sister; Colon polyps in her sister; Diabetes in her paternal grandfather; Lung cancer in her sister; Lymphoma (age of onset: 83) in her sister; Lymphoma (age of onset: 79) in her mother; Stroke in her maternal grandfather.   HOME MEDICATIONS: Allergies as of 12/31/2021       Reactions   Aripiprazole Other (See Comments)   Parkinsonism     Lactose Intolerance (gi) Diarrhea   Methotrexate Other (See Comments)   Hair loss, severe stomatitis   Cefdinir Diarrhea  Other reaction(s): Diarrhea Yeast infection and fever; negative c diff   Etanercept Other (See Comments)   Headaches   Exemestane Other (See Comments)   Suicidal thoughts with medication   Fluoxetine Other (See Comments)   Parkinsonism   Methylprednisolone Sodium Succ Other (See Comments)   Agitated mania   Epinephrine Palpitations   tachycardia   Nitrofurantoin Nausea And Vomiting, Rash     Other reaction(s): rash, diarrhea Other reaction(s): rash, diarrhea Other reaction(s): rash, diarrhea Other reaction(s): rash, diarrhea Other reaction(s): rash, diarrhea Other reaction(s): rash, diarrhea         Medication List        Accurate as of December 31, 2021  8:26 AM. If you have any questions, ask your nurse or doctor.          aMILoride 5 MG tablet Commonly known as: MIDAMOR Take 5 mg by mouth every morning.   atorvastatin 10 MG tablet Commonly known as: LIPITOR Take 1 tablet (10 mg total) by mouth daily.   Azelaic Acid 15 % gel Apply topically as needed.   benzonatate 100 MG capsule Commonly known as: TESSALON as needed.   betamethasone dipropionate 0.17 % cream 1 application Externally Twice a day   Cholecalciferol 50 MCG (2000 UT) Tabs 1 tablet Orally Once a day for 30 day(s)   Fish Oil 1200 MG Caps Take 1 capsule by mouth in the morning and at bedtime.   lamoTRIgine 200 MG tablet Commonly known as: LAMICTAL Take 1 tablet (200 mg total) by mouth at bedtime.   lamoTRIgine 100 MG tablet Commonly known as: LaMICtal Take one tablet in the morning and take one tablet at lunch.   levothyroxine 100 MCG tablet Commonly known as: SYNTHROID Take 100 mcg by mouth. Takes 100 mg Saturday and Sunday   levothyroxine 112 MCG tablet Commonly known as: SYNTHROID Take 112 mcg by mouth. Takes days Monday through Friday.   LORazepam 1 MG tablet Commonly known as: ATIVAN TAKE 1 TABLET(1 MG) BY MOUTH TWICE DAILY AS NEEDED   metroNIDAZOLE 0.75 % gel Commonly known as: METROGEL Apply to face 1-2 times daily   N-Acetyl Cysteine 600 MG Caps Generic drug: Acetylcysteine 1 capsule Orally Twice a day   SIMPONI ARIA IV   sodium bicarbonate 650 MG tablet Take 650 mg by mouth 2 (two) times daily.   traZODone 50 MG tablet Commonly known as: DESYREL Take one to two tablets at bedtime.   Vitamin B Complex Tabs 1 tablet Orally Once daily          REVIEW OF SYSTEMS: A comprehensive ROS was conducted with the patient and is negative except as per HPI   OBJECTIVE:  VS: BP 122/74 (BP Location: Right Arm, Patient Position: Sitting, Cuff Size: Large)   Pulse (!) 55    Ht '5\' 4"'$  (1.626 m)   Wt 199 lb 6.4 oz (90.4 kg)   SpO2 95%   BMI 34.23 kg/m    Wt Readings from Last 3 Encounters:  12/10/21 201 lb (91.2 kg)  11/26/21 193 lb (87.5 kg)  11/02/21 202 lb 6.1 oz (91.8 kg)     EXAM: General: Pt appears well and is in NAD  Eyes: External eye exam normal without stare, lid lag or exophthalmos.  EOM intact.    Neck: General: Supple without adenopathy. Thyroid: Thyroid size normal.  No goiter or nodules appreciated.   Lungs: Clear with good BS bilat with no rales, rhonchi, or wheezes  Heart: Auscultation: RRR.  Extremities:  BL  LE: No pretibial edema normal ROM and strength.  Mental Status: Judgment, insight: Intact Orientation: Oriented to time, place, and person Mood and affect: No depression, anxiety, or agitation     DATA REVIEWED:  Latest Reference Range & Units 12/31/21 12:09  TSH 0.35 - 5.50 uIU/mL 0.18 (L)  T4,Free(Direct) 0.60 - 1.60 ng/dL 1.20    ASSESSMENT/PLAN/RECOMMENDATIONS:   Hypothyroidism:   -This has been attributed to chronic lithium use.  She has been off lithium sometime over the past month which has resulted in decrease LT-4 requirements -Patient is clinically euthyroid -Her TSH continues to be low -We will reduce levothyroxine as below, and recheck in 2 months   Medications : Stop levothyroxine 112 mcg Start levothyroxine 100 mcg daily   Labs in 8 weeks  Follow-up in 4 months Signed electronically by: Mack Guise, MD  [facility]  [facility] [address] [place], [place] [address] Phone: [phone] FAX: [phone]   CC: Inda Coke, [name] [address] Phone: [phone] Fax: [phone]   Return to Endocrinology clinic as below: Future Appointments  Date Time Provider [place]  12/31/2021 11:30 AM Danie Diehl, Melanie Crazier, MD [facility] None  01/01/2022 12:00 PM Mozingo, Berdie Ogren, NP [facility]  None  01/04/2022  1:45 PM Carney Living, PT [facility]  01/08/2022 12:00 PM Mozingo, Berdie Ogren, NP [facility] None  01/10/2022  2:00 PM Hussami, Christina R, LCSW [facility] None  01/29/2022  2:00 PM Hussami, Christina R, LCSW [facility] None  02/13/2022  2:00 PM Hussami, Christina R, LCSW [facility] None  04/15/2022  1:45 PM [facility] HEALTH COACH [facility]

## 2021-12-31 NOTE — Patient Instructions (Signed)

## 2022-01-01 ENCOUNTER — Encounter: Payer: Self-pay | Admitting: Adult Health

## 2022-01-01 ENCOUNTER — Ambulatory Visit (INDEPENDENT_AMBULATORY_CARE_PROVIDER_SITE_OTHER): Payer: Medicare Other | Admitting: Adult Health

## 2022-01-01 ENCOUNTER — Ambulatory Visit: Payer: Medicare Other | Admitting: Skilled Nursing Facility1

## 2022-01-01 DIAGNOSIS — F411 Generalized anxiety disorder: Secondary | ICD-10-CM

## 2022-01-01 DIAGNOSIS — F319 Bipolar disorder, unspecified: Secondary | ICD-10-CM | POA: Diagnosis not present

## 2022-01-01 DIAGNOSIS — F4310 Post-traumatic stress disorder, unspecified: Secondary | ICD-10-CM

## 2022-01-01 DIAGNOSIS — F5105 Insomnia due to other mental disorder: Secondary | ICD-10-CM

## 2022-01-01 DIAGNOSIS — F99 Mental disorder, not otherwise specified: Secondary | ICD-10-CM

## 2022-01-01 NOTE — Progress Notes (Signed)
Tiffany [name] [id number] February 26, 1951 71 y.o.  Subjective:   Patient ID:  Tiffany [name] is a 71 y.o. (DOB [birth year]) female.  Chief Complaint: No chief complaint on file.   HPI Tiffany [name] presents to the office today for follow-up of PTSD, insomnia, GAD and  BPD 1.  Accompanied by husband.  Describes mood today as "ok". Pleasant. Reports some tearfulness. Mood symptoms - denies depression. Feels irritable and anxious at times. Reports some worry and rumination - "current stressors". Mood is variable - still cycling - twice a day. Stating "I'm ok". Feels like medications are helpful. Supportive family. Met with new endocrinoogist - feels "confident". Seeing Earlie Server - therapist. Followed by nephrology. Varying interest and motivation. Taking medications as prescribed. Energy levels stable. Active, does not have a regular exercise routine with physical limitations Enjoys some usual interests and activities. Married. Lives with husband their daughter. Talking to family and friends.  Appetite improved. Weight is variable - 201 pounds Sleep is variable. Averages 6.5 to 7 hours of broken sleep. Focus and concentration varies - getting things done. Completing tasks. Managing some aspects of household. Retired.  Denies SI or HI.  Denies AH or VH  Denies self harm. Denies substance use.  Working with P/T   Previous medications: Celexa, Zyprexa, Tegretol, Depakote, Serzone, Topamax, Seroquel, Effexor, Lexapro, Desipramine, Neurontin, Abilify, Geodon, Propanolol, Cymbalta, Cogentin, Trihexyphenadyl, Sinmmet, Provigil, Selegiline, Requip, Amantadine, Prozac, Mirapex, Azilect, Metoclopramide, Baclofen, Artane, Namenda, Latuda -SOB and heart palpitations.   [place] Office Visit from 02/16/2021 in [facility] Visit from 05/14/2019 in [facility]  Total GAD-7 Score 13 [place] Office Visit from  06/07/2019 in [facility]  Total Score (max 30 points ) 29      PHQ2-9    [place] from 11/30/2021 in [facility] Coordination Nutrition from 11/06/2021 [facility] Office Visit from 07/30/2021 in [facility] Visit from 07/02/2021 in [facility] from 04/02/2021 in [facility]  PHQ-2 Total Score 0 0 2 1 1   PHQ-9 Total Score -- -- 11 6 1       Flowsheet Row ED from 11/26/2021 in [facility] Clinical Support from 04/02/2021 in [facility] from 03/21/2021 in [facility]  C-SSRS RISK CATEGORY No Risk No Risk No Risk        Review of Systems:  Review of Systems  Musculoskeletal:  Negative for gait problem.  Neurological:  Negative for tremors.  Psychiatric/Behavioral:         Please refer to HPI    Medications: I have reviewed the patient's current medications.  Current Outpatient Medications  Medication Sig Dispense Refill   Acetylcysteine (N-ACETYL CYSTEINE) 600 MG CAPS 1 capsule Orally Twice a day     atorvastatin (LIPITOR) 10 MG tablet Take 1 tablet (10 mg total) by mouth daily. 90 tablet 1   Azelaic Acid 15 % gel Apply topically as needed.     B Complex Vitamins (VITAMIN B COMPLEX) TABS 1 tablet Orally Once daily     benzonatate (TESSALON) 100 MG capsule as needed.     betamethasone dipropionate 6.26 % cream 1 application Externally Twice a day     Cholecalciferol 50 MCG (2000 UT) TABS 1 tablet Orally Once a day for 30  day(s)     Golimumab (SIMPONI ARIA IV)      lamoTRIgine (LAMICTAL) 100 MG tablet Take one tablet in the morning and take one tablet at lunch. 180 tablet 1   lamoTRIgine (LAMICTAL) 200 MG tablet Take 1 tablet (200 mg total) by mouth at bedtime. 90 tablet 3   levothyroxine (SYNTHROID) 100 MCG tablet  Take 100 mcg by mouth daily at 6 (six) AM. M-F     levothyroxine (SYNTHROID) 112 MCG tablet Take 112 mcg by mouth daily at 6 (six) AM. Sat-Sun     LORazepam (ATIVAN) 1 MG tablet TAKE 1 TABLET(1 MG) BY MOUTH TWICE DAILY AS NEEDED 60 tablet 2   metroNIDAZOLE (METROGEL) 0.75 % gel Apply to face 1-2 times daily 45 g 0   Omega-3 Fatty Acids (FISH OIL) 1200 MG CAPS Take 1 capsule by mouth in the morning and at bedtime.     sodium bicarbonate 650 MG tablet Take 650 mg by mouth 2 (two) times daily.     traZODone (DESYREL) 50 MG tablet Take one to two tablets at bedtime. 90 tablet 3   No current facility-administered medications for this visit.    Medication Side Effects: None  Allergies:  Allergies  Allergen Reactions   Aripiprazole Other (See Comments)    Parkinsonism      Lactose Intolerance (Gi) Diarrhea   Methotrexate Other (See Comments)    Hair loss, severe stomatitis     Cefdinir Diarrhea    Other reaction(s): Diarrhea Yeast infection and fever; negative c diff   Etanercept Other (See Comments)    Headaches     Exemestane Other (See Comments)    Suicidal thoughts with medication     Fluoxetine Other (See Comments)    Parkinsonism   Methylprednisolone Sodium Succ Other (See Comments)    Agitated mania   Epinephrine Palpitations    tachycardia    Nitrofurantoin Nausea And Vomiting and Rash      Other reaction(s): rash, diarrhea Other reaction(s): rash, diarrhea Other reaction(s): rash, diarrhea Other reaction(s): rash, diarrhea Other reaction(s): rash, diarrhea Other reaction(s): rash, diarrhea    Past Medical History:  Diagnosis Date   Bipolar 1 disorder ([place])    Breast cancer ([place])    Chronic diarrhea    loose stools twice a day on average for years   Chronic kidney disease (CKD), stage III (moderate) ([place])    Depression 1987   Family history of breast cancer    Hospitalization or health care facility admission within last 6 months 04/2019   for  fall/seizures   Malignant neoplasm of overlapping sites of left breast in female, estrogen receptor positive ([place]) 03/14/2016   Dx in 09/2014, s/p bilateral mastectomies and ALND, 0/10 LN. 1.4 cm  Grade I invasive lobular, ER and PR +/Her--, Ki 67 <5% Tried anastrozole for one month, but developed suicidal idea   Osteoarthritis, knee 09/24/2019   Xray 09/2019   Parkinson's disease 2012   Personal history of malignant neoplasm of breast    Polyposis of colon    Psoriatic arthritis ([place])    PTSD (post-traumatic stress disorder)    Secondary hyperparathyroidism ([place])    Tardive dyskinesia    Thyroid disease     Past Medical History, Surgical history, Social history, and Family history were reviewed and updated as appropriate.   Please see review of systems for further details on the patient's review from today.   Objective:   Physical Exam:  There were no vitals taken for this visit.  Physical Exam Constitutional:      General: She is not in acute distress. Musculoskeletal:        General: No deformity.  Neurological:     Mental Status: She is alert and oriented to person, place, and time.     Coordination: Coordination normal.  Psychiatric:        Attention and Perception: Attention and perception normal. She does not perceive auditory or visual hallucinations.        Mood and Affect: Mood normal. Mood is not anxious or depressed. Affect is not labile, blunt, angry or inappropriate.        Speech: Speech normal.        Behavior: Behavior normal.        Thought Content: Thought content normal. Thought content is not paranoid or delusional. Thought content does not include homicidal or suicidal ideation. Thought content does not include homicidal or suicidal plan.        Cognition and Memory: Cognition and memory normal.        Judgment: Judgment normal.     Comments: Insight intact     Lab Review:     Component Value Date/Time   NA 142 12/10/2021 1044   NA 142 10/17/2021  0000   K 4.8 12/10/2021 1044   CL 109 12/10/2021 1044   CO2 24 12/10/2021 1044   GLUCOSE 88 12/10/2021 1044   BUN 36 (H) 12/10/2021 1044   BUN 30 (A) 10/17/2021 0000   CREATININE 2.45 (H) 12/10/2021 1044   CREATININE 2.29 (H) 05/22/2021 1044   CALCIUM 9.5 12/10/2021 1044   PROT 7.0 12/10/2021 1044   ALBUMIN 4.4 12/10/2021 1044   AST 14 12/10/2021 1044   ALT 12 12/10/2021 1044   ALKPHOS 122 (H) 12/10/2021 1044   BILITOT 0.7 12/10/2021 1044   GFRNONAA 20 (L) 10/29/2021 1055   GFRAA 25 (L) 09/13/2019 1243       Component Value Date/Time   WBC 9.5 10/29/2021 1055   RBC 4.64 10/29/2021 1055   HGB 14.1 10/29/2021 1055   HCT 44.1 10/29/2021 1055   PLT 214 10/29/2021 1055   MCV 95.0 10/29/2021 1055   MCH 30.4 10/29/2021 1055   MCHC 32.0 10/29/2021 1055   RDW 13.2 10/29/2021 1055   LYMPHSABS 3.2 07/02/2021 1048   MONOABS 0.7 07/02/2021 1048   EOSABS 0.2 07/02/2021 1048   BASOSABS 0.1 07/02/2021 1048    Lithium Lvl  Date Value Ref Range Status  05/22/2021 0.7 0.6 - 1.2 mmol/L Final     No results found for: "PHENYTOIN", "PHENOBARB", "VALPROATE", "CBMZ"   .res Assessment: Plan:    Plan:  Therapist - Christina Hussami - [facility]  Trazadone 6m - 1 to 2 at bedtime Lorazepam 156mBID for anxiety - may take one tablet extra for severe anxiety symptoms. Taking one at night routinely. Lamictal 20025ms Lamictal 150m30mery morning.  Consider low dose of Gabapentin.  NAC tablets BID - for obsessive thoughts, worry, and rumination - unable to take SSRI's  RTC weekly  Seeing Endocrinologist on 10/23  Counseled patient regarding potential benefits, risks, and side effects of Lamictal to include potential risk of Stevens-Johnson syndrome. Advised patient to stop taking Lamictal and contact office immediately if rash develops and to seek urgent medical attention if rash is severe and/or spreading quickly.  Discussed potential benefits, risk, and side effects of  benzodiazepines to include potential risk of tolerance and dependence, as well as possible drowsiness. Advised patient not to drive if  experiencing drowsiness and to take lowest possible effective dose to minimize risk of dependence and tolerance.   Diagnoses and all orders for this visit:  Bipolar I disorder ([place])  Generalized anxiety disorder  PTSD (post-traumatic stress disorder)  Insomnia due to other mental disorder     Please see After Visit Summary for patient specific instructions.  Future Appointments  Date Time Provider [place]  01/04/2022  1:45 PM Carney Living, PT [facility]  01/08/2022 12:00 PM Jerard Bays, Berdie Ogren, NP [facility] None  01/10/2022  2:00 PM Hussami, Christina R, LCSW [facility] None  01/29/2022  2:00 PM Hussami, Christina R, LCSW [facility] None  02/13/2022  2:00 PM Hussami, Christina R, LCSW [facility] None  03/06/2022 11:45 AM [facility] LAB [facility] None  04/15/2022  1:45 PM [facility] HEALTH COACH [facility]  05/08/2022 12:10 PM Shamleffer, Melanie Crazier, MD [facility] None    No orders of the defined types were placed in this encounter.   -------------------------------

## 2022-01-04 ENCOUNTER — Encounter (HOSPITAL_BASED_OUTPATIENT_CLINIC_OR_DEPARTMENT_OTHER): Payer: Self-pay | Admitting: Physical Therapy

## 2022-01-04 ENCOUNTER — Telehealth: Payer: Self-pay | Admitting: Physician Assistant

## 2022-01-04 ENCOUNTER — Ambulatory Visit (HOSPITAL_BASED_OUTPATIENT_CLINIC_OR_DEPARTMENT_OTHER): Payer: Medicare Other | Admitting: Physical Therapy

## 2022-01-04 DIAGNOSIS — R2681 Unsteadiness on feet: Secondary | ICD-10-CM

## 2022-01-04 DIAGNOSIS — G8929 Other chronic pain: Secondary | ICD-10-CM

## 2022-01-04 DIAGNOSIS — M6281 Muscle weakness (generalized): Secondary | ICD-10-CM

## 2022-01-04 DIAGNOSIS — R2689 Other abnormalities of gait and mobility: Secondary | ICD-10-CM

## 2022-01-04 MED ORDER — METRONIDAZOLE 0.75 % EX GEL: CUTANEOUS | 1 refills | Status: AC

## 2022-01-04 NOTE — Therapy (Addendum)
OUTPATIENT PHYSICAL THERAPY LOWER EXTREMITY TREATMENT/Progress Note    Patient Name: Tiffany [name] MRN: [id number] DOB:January 29, 1951, 71 y.o., female Today's Date: 01/04/2022   PT End of Session - 01/04/22 1351     Visit Number 11    Number of Visits 13    Date for PT Re-Evaluation 02/08/22    Authorization Type KX right now    PT Start Time [id number]    PT Stop Time 1427    PT Time Calculation (min) 42 min    Activity Tolerance Patient tolerated treatment well    Behavior During Therapy [facility] for tasks assessed/performed             Progress Note Reporting Period 11/14/2021 to 12/27/2021  See note below for Objective Data and Assessment of Progress/Goals.       Past Medical History:  Diagnosis Date   Bipolar 1 disorder ([place])    Breast cancer ([place])    Chronic diarrhea    loose stools twice a day on average for years   Chronic kidney disease (CKD), stage III (moderate) ([place])    Depression 1987   Family history of breast cancer    Hospitalization or health care facility admission within last 6 months 04/2019   for fall/seizures   Malignant neoplasm of overlapping sites of left breast in female, estrogen receptor positive ([place]) 03/14/2016   Dx in 09/2014, s/p bilateral mastectomies and ALND, 0/10 LN. 1.4 cm  Grade I invasive lobular, ER and PR +/Her--, Ki 67 <5% Tried anastrozole for one month, but developed suicidal idea   Osteoarthritis, knee 09/24/2019   Xray 09/2019   Parkinson's disease 2012   Personal history of malignant neoplasm of breast    Polyposis of colon    Psoriatic arthritis ([place])    PTSD (post-traumatic stress disorder)    Secondary hyperparathyroidism ([place])    Tardive dyskinesia    Thyroid disease    Past Surgical History:  Procedure Laterality Date   [place] Bilateral 09/19/2014   [place] Bilateral 1974   Patient  Active Problem List   Diagnosis Date Noted   Genetic testing 06/14/2021   Family history of breast cancer 06/05/2021   Osteoarthritis, knee 09/24/2019   CKD (chronic kidney disease) stage 4, GFR 15-29 ml/min ([place]) 09/24/2019   Severe recurrent major depression without psychotic features ([place]) 09/09/2019   Bipolar I disorder, most recent episode depressed ([place])    Transaminitis    Essential hypertension    Encephalopathy 04/26/2019   Leukocytosis 04/21/2019   Thrombocytopenia ([place]) 09/04/2018   Vitamin D deficiency 09/04/2018   Psoriatic arthritis ([place]) 09/04/2018   Tubular adenoma of colon 05/28/2017   History of colonic polyps 05/28/2017   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 09/12/2016   Malignant neoplasm of overlapping sites of left breast in female, estrogen receptor positive ([place]) 03/14/2016   Tardive dyskinesia 10/18/2015   History of breast cancer left 2016 12/27/2014   Osteopenia determined by x-ray 10/31/2014   Rosacea 05/21/2007   Bipolar affective disorder, mixed ([place]) 08/16/2004   Acquired hypothyroidism 04/03/1996    PCP: Inda Coke, PA  REFERRING PROVIDER: Inda Coke, PA  REFERRING DIAG:  [phone] - recurrent pain of right knee  THERAPY DIAG:  Other abnormalities of gait and mobility  Unsteadiness on feet  Muscle weakness (generalized)  Chronic pain of right knee  Rationale for Evaluation and Treatment Rehabilitation  ONSET DATE: 3 weeks ago  SUBJECTIVE:   SUBJECTIVE STATEMENT: The patient is doing better. She is still having whole body pain but it hasn't been bad the last few days. Her knee is the best it has felt.   PERTINENT HISTORY: Bi-polar, CKD, depression, breast cancer, OA, Parkinson's, Psoriatic Arthritis, PTSD, tardive diskenesia,   PAIN:  Are you having pain? Yes: NPRS scale: 6/10 Pain location: right lateral knee Pain description: aching Aggravating factors: standing, walking ,  twisting Relieving factors: rest  PRECAUTIONS: None  WEIGHT BEARING RESTRICTIONS No  FALLS:  Has patient fallen in last 6 months? No  LIVING ENVIRONMENT: Lives with husband  OCCUPATION: retired   PLOF: Independent uses an assitvie device at times  PATIENT GOALS   To have less pain in her knee and to get back into the gym   OBJECTIVE:   DIAGNOSTIC FINDINGS:   PATIENT SURVEYS:   COGNITION:  Overall cognitive status: Within functional limits for tasks assessed     SENSATION: WFL   POSTURE: rounded shoulders and forward head  PALPATION: Trigger points in lower IT band   LOWER EXTREMITY ROM:  Active PROM Right eval Left eval Right  9/6   Hip flexion      Hip extension      Hip abduction      Hip adduction      Hip internal rotation      Hip external rotation      Knee flexion 94  106   Knee extension -13  -8   Ankle dorsiflexion      Ankle plantarflexion      Ankle inversion      Ankle eversion       (Blank rows = not tested)  LOWER EXTREMITY MMT:  MMT Right eval Left eval  Hip flexion 16.1 23.1  Hip extension    Hip abduction    Hip adduction 29.7 27.3  Hip internal rotation    Hip external rotation    Knee flexion    Knee extension 25.6 33  Ankle dorsiflexion    Ankle plantarflexion    Ankle inversion    Ankle eversion     (Blank rows = not tested)   GAIT: Decreased single leg stance time on the right    TODAY'S TREATMENT: 10/20  1# [employer] Leg Press 15 lbs 2x15  #6 [employer] Hip Abduction 30 2x15    12# [employer] row 15 lbs 2x15  15# [employer] triceps press down 30 lbs 2x15   Cable Rows 10 lbs 2x10  Cable Extension 10 lbs  2x10   Nu-step 5 min L5    10/20  X-fit 5 min reviewed set up Reviewed self set up of nu-step  Leg press 10 lbs 3x15  Hip abduction 3x15   Shoulder row 3x15  Shoulder extension 3x15 10lbs      9/29 Seated march 2x15 yellow  Seated hip abduction 2x15 yellow  Hamstring curl  2x10 yellow   Heel raise x20  Standing march x20    Decompression:   Legs up on a pillow or two -> palms up -> deep breathing for 5-10 minutes  Leg lengthener: push heel towards the wall with deep breathing   Sink into sand: press leg down and breath 5-10 x   Shoulder sink into the sand 5-10 x       PATIENT EDUCATION:  Education details: reviewed HEP and gym equipment.  Person educated: Patient  Education method: Explanation, Demonstration, Tactile cues, Verbal cues, and Handouts Education comprehension: verbalized understanding, returned demonstration, verbal cues required, tactile cues required, and needs further education   HOME EXERCISE PROGRAM: Has past HEP   ASSESSMENT:  CLINICAL IMPRESSION: The patient has made excellent progress. She came in today without her walker. She was safe getting on and off all of the equipment today. We have introduced her to a health and fitness instructor who will continue to work with her over the next few weeks. Her knee has been doing very well recently. We will keep her case open for now but we will likely D/C to HEP if all goes well.   OBJECTIVE IMPAIRMENTS Abnormal gait, decreased activity tolerance, decreased endurance, decreased mobility, difficulty walking, decreased ROM, decreased strength, increased fascial restrictions, and pain.   ACTIVITY LIMITATIONS carrying, bending, standing, squatting, stairs, transfers, and locomotion level  PARTICIPATION LIMITATIONS: cleaning, laundry, shopping, community activity, and yard work  PERSONAL FACTORS 3+ comorbidities: Psoriatic arthritis, Bi-polar parkinsosn  are also affecting patient's functional outcome.   REHAB POTENTIAL: Good  CLINICAL DECISION MAKING: Evolving/moderate complexity  EVALUATION COMPLEXITY: Moderate   GOALS: Goals reviewed with patient? Yes  SHORT TERM GOALS: Target date: 09/09/2021 reviewed on 6/28  Patient will increase gross right LE strength by 5 lbs   Baseline: Goal status: ]not tested today   2.  Patient will be increase knee extension passive by 5 degrees  Baseline:  Goal status: Achieved improved to 08 degrees today   3.  Patient will be independent with basic HEP  Baseline:  Goal status: independent with basic HEP. Advancing back to gym program   LONG TERM GOALS: Target date: 10/11/2021   Patient will stand for 20 min without pain in order to perform her ADL's  Baseline:  Goal status: INITIAL  2.  Patient will be independent with full gym program  Baseline:  Goal status: INITIAL  3.  Patient will ambulate without increased pain  Baseline:  Goal status: INITIAL    PLAN: PT FREQUENCY: 2x/week  PT DURATION: 8 weeks  PLANNED INTERVENTIONS: Therapeutic exercises, Therapeutic activity, Neuromuscular re-education, Balance training, Gait training, Patient/Family education, Joint mobilization, Stair training, DME instructions, Aquatic Therapy, Dry Needling, Electrical stimulation, Cryotherapy, Moist heat, Ultrasound, and Manual therapy  PLAN FOR NEXT SESSION: Continue manual as indicated, progress to resistive standing exercise, balance/SLS as tolerated   Carney Living, PT 01/04/2022, 2:18 PM   PHYSICAL THERAPY DISCHARGE SUMMARY  Visits from Start of Care: 11  Current functional level related to goals / functional outcomes: See above   Remaining deficits: See above   Education / Equipment: Anatomy of condition, POC, HEP, exercise form/rationale    Patient agrees to discharge. Patient goals were not met. Patient is being discharged due to not returning since the last visit. Jessica C. Hightower PT, DPT 05/02/22 11:12 AM

## 2022-01-04 NOTE — Telephone Encounter (Signed)
   LAST APPOINTMENT DATE:  Please schedule appointment if longer than 1 year  12/10/21  NEXT APPOINTMENT DATE:  MEDICATION: metroNIDAZOLE (METROGEL) 0.75 % gel   Is the patient out of medication?  Yes  PHARMACY:  [facility] [id number] - [place], [place] - [address] Phone: [phone]  Fax: [phone]     Let patient know to contact pharmacy at the end of the day to make sure medication is ready.  Please notify patient to allow 48-72 hours to process

## 2022-01-04 NOTE — Telephone Encounter (Signed)
Spoke to pt told her Rx for Metrogel sent to pharmacy. Pt verbalized understanding.

## 2022-01-08 ENCOUNTER — Ambulatory Visit (INDEPENDENT_AMBULATORY_CARE_PROVIDER_SITE_OTHER): Payer: Medicare Other | Admitting: Adult Health

## 2022-01-08 ENCOUNTER — Encounter: Payer: Self-pay | Admitting: Adult Health

## 2022-01-08 ENCOUNTER — Encounter: Payer: Self-pay | Admitting: Internal Medicine

## 2022-01-08 DIAGNOSIS — F99 Mental disorder, not otherwise specified: Secondary | ICD-10-CM

## 2022-01-08 DIAGNOSIS — F4310 Post-traumatic stress disorder, unspecified: Secondary | ICD-10-CM | POA: Diagnosis not present

## 2022-01-08 DIAGNOSIS — F319 Bipolar disorder, unspecified: Secondary | ICD-10-CM

## 2022-01-08 DIAGNOSIS — F411 Generalized anxiety disorder: Secondary | ICD-10-CM | POA: Diagnosis not present

## 2022-01-08 DIAGNOSIS — F5105 Insomnia due to other mental disorder: Secondary | ICD-10-CM | POA: Diagnosis not present

## 2022-01-08 MED ORDER — LEVOTHYROXINE SODIUM 100 MCG PO TABS: 100.0000 ug | ORAL_TABLET | Freq: Every day | ORAL | 3 refills | Status: DC

## 2022-01-08 NOTE — Progress Notes (Signed)
Tiffany [name] [id number] [birth year] 71 y.o.  Subjective:   Patient ID:  Tiffany [name] is a 71 y.o. (DOB [birth year]) female.  Chief Complaint: No chief complaint on file.   HPI Tiffany [name] presents to the office today for follow-up of PTSD, insomnia, GAD and  BPD 1.  Accompanied by husband.  Describes mood today as "ok". Pleasant. Reports some tearfulness. Mood symptoms - denies depression - "more emotional at times". Reports less irritability. Decreased anxiety. Reports some worry and rumination. Mood is variable - still cycling. Stating "I'm feel like I'm ok - safe". Feels like medications are helpful. Supportive family.  Seeing Earlie Server - therapist. Followed by nephrology - endocrinology. Varying interest and motivation. Taking medications as prescribed. Energy levels stable. Working with P/T. Active, plans to start working with a personal trainer - operating machines, etc so she can develop a regular exercise routine within her physical limitations.  Enjoys some usual interests and activities - getting out with husband. Also driving independently. Married. Lives with husband their daughter. Talking to family and friends.  Appetite improved. Weight loss 196 from 201 pounds Sleep is variable. Averages 6.5 to 7 hours of broken sleep. Focus and concentration varies. Completing tasks. Managing some aspects of household. Retired.  Denies SI or HI.  Denies AH or VH  Denies self harm. Denies substance use.  Working with P/T   Previous medications: Celexa, Zyprexa, Tegretol, Depakote, Serzone, Topamax, Seroquel, Effexor, Lexapro, Desipramine, Neurontin, Abilify, Geodon, Propanolol, Cymbalta, Cogentin, Trihexyphenadyl, Sinmmet, Provigil, Selegiline, Requip, Amantadine, Prozac, Mirapex, Azilect, Metoclopramide, Baclofen, Artane, Namenda, Latuda -SOB and heart palpitations.    [place] Office Visit from 02/16/2021 in [employer] Visit from  05/14/2019 in [facility]  Total GAD-7 Score 13 [place] Office Visit from 06/07/2019 in [facility]  Total Score (max 30 points ) 29      PHQ2-9    [place] from 11/30/2021 in [facility] Coordination Nutrition from 11/06/2021 [facility] Visit from 07/30/2021 in [facility] Visit from 07/02/2021 in [facility] from 04/02/2021 in [facility]  PHQ-2 Total Score 0 0 '2 1 1  '$ PHQ-9 Total Score -- -- '11 6 1      '$ Flowsheet Row ED from 11/26/2021 in [facility] Clinical Support from 04/02/2021 in [facility] from 03/21/2021 in [facility]  C-SSRS RISK CATEGORY No Risk No Risk No Risk        Review of Systems:  Review of Systems  Musculoskeletal:  Negative for gait problem.  Neurological:  Negative for tremors.  Psychiatric/Behavioral:         Please refer to HPI    Medications: I have reviewed the patient's current medications.  Current Outpatient Medications  Medication Sig Dispense Refill   Acetylcysteine (N-ACETYL CYSTEINE) 600 MG CAPS 1 capsule Orally Twice a day     atorvastatin (LIPITOR) 10 MG tablet Take 1 tablet (10 mg total) by mouth daily. 90 tablet 1   Azelaic Acid 15 % gel Apply topically as needed.     B Complex Vitamins (VITAMIN B COMPLEX) TABS 1 tablet Orally Once daily     benzonatate (TESSALON) 100 MG capsule as needed.     betamethasone dipropionate 4.49 % cream 1 application Externally  Twice a day     Cholecalciferol 50 MCG (2000 UT) TABS 1 tablet Orally Once a day for 30 day(s)     Golimumab (SIMPONI ARIA IV)      lamoTRIgine (LAMICTAL) 100 MG tablet Take one tablet in the morning and take one tablet at lunch. 180 tablet 1    lamoTRIgine (LAMICTAL) 200 MG tablet Take 1 tablet (200 mg total) by mouth at bedtime. 90 tablet 3   levothyroxine (SYNTHROID) 100 MCG tablet Take 1 tablet (100 mcg total) by mouth daily at 6 (six) AM. 90 tablet 3   LORazepam (ATIVAN) 1 MG tablet TAKE 1 TABLET(1 MG) BY MOUTH TWICE DAILY AS NEEDED 60 tablet 2   metroNIDAZOLE (METROGEL) 0.75 % gel Apply to face 1-2 times daily 45 g 1   Omega-3 Fatty Acids (FISH OIL) 1200 MG CAPS Take 1 capsule by mouth in the morning and at bedtime.     sodium bicarbonate 650 MG tablet Take 650 mg by mouth 2 (two) times daily.     traZODone (DESYREL) 50 MG tablet Take one to two tablets at bedtime. 90 tablet 3   No current facility-administered medications for this visit.    Medication Side Effects: None  Allergies:  Allergies  Allergen Reactions   Aripiprazole Other (See Comments)    Parkinsonism      Lactose Intolerance (Gi) Diarrhea   Methotrexate Other (See Comments)    Hair loss, severe stomatitis     Cefdinir Diarrhea    Other reaction(s): Diarrhea Yeast infection and fever; negative c diff   Etanercept Other (See Comments)    Headaches     Exemestane Other (See Comments)    Suicidal thoughts with medication     Fluoxetine Other (See Comments)    Parkinsonism   Methylprednisolone Sodium Succ Other (See Comments)    Agitated mania   Epinephrine Palpitations    tachycardia    Nitrofurantoin Nausea And Vomiting and Rash      Other reaction(s): rash, diarrhea Other reaction(s): rash, diarrhea Other reaction(s): rash, diarrhea Other reaction(s): rash, diarrhea Other reaction(s): rash, diarrhea Other reaction(s): rash, diarrhea    Past Medical History:  Diagnosis Date   Bipolar 1 disorder ([place])    Breast cancer ([place])    Chronic diarrhea    loose stools twice a day on average for years   Chronic kidney disease (CKD), stage III (moderate) ([place])    Depression 1987   Family history of breast cancer    Hospitalization or health  care facility admission within last 6 months 04/2019   for fall/seizures   Malignant neoplasm of overlapping sites of left breast in female, estrogen receptor positive ([place]) 03/14/2016   Dx in 09/2014, s/p bilateral mastectomies and ALND, 0/10 LN. 1.4 cm  Grade I invasive lobular, ER and PR +/Her--, Ki 67 <5% Tried anastrozole for one month, but developed suicidal idea   Osteoarthritis, knee 09/24/2019   Xray 09/2019   Parkinson's disease 2012   Personal history of malignant neoplasm of breast    Polyposis of colon    Psoriatic arthritis ([place])    PTSD (post-traumatic stress disorder)    Secondary hyperparathyroidism ([place])    Tardive dyskinesia    Thyroid disease     Past Medical History, Surgical history, Social history, and Family history were reviewed and updated as appropriate.   Please see review of systems for further details on the patient's review from today.   Objective:   Physical Exam:  There were no vitals  taken for this visit.  Physical Exam Constitutional:      General: She is not in acute distress. Musculoskeletal:        General: No deformity.  Neurological:     Mental Status: She is alert and oriented to person, place, and time.     Coordination: Coordination normal.  Psychiatric:        Attention and Perception: Attention and perception normal. She does not perceive auditory or visual hallucinations.        Mood and Affect: Mood normal. Mood is not anxious or depressed. Affect is not labile, blunt, angry or inappropriate.        Speech: Speech normal.        Behavior: Behavior normal.        Thought Content: Thought content normal. Thought content is not paranoid or delusional. Thought content does not include homicidal or suicidal ideation. Thought content does not include homicidal or suicidal plan.        Cognition and Memory: Cognition and memory normal.        Judgment: Judgment normal.     Comments: Insight intact     Lab Review:     Component Value  Date/Time   NA 142 12/10/2021 1044   NA 142 10/17/2021 0000   K 4.8 12/10/2021 1044   CL 109 12/10/2021 1044   CO2 24 12/10/2021 1044   GLUCOSE 88 12/10/2021 1044   BUN 36 (H) 12/10/2021 1044   BUN 30 (A) 10/17/2021 0000   CREATININE 2.45 (H) 12/10/2021 1044   CREATININE 2.29 (H) 05/22/2021 1044   CALCIUM 9.5 12/10/2021 1044   PROT 7.0 12/10/2021 1044   ALBUMIN 4.4 12/10/2021 1044   AST 14 12/10/2021 1044   ALT 12 12/10/2021 1044   ALKPHOS 122 (H) 12/10/2021 1044   BILITOT 0.7 12/10/2021 1044   GFRNONAA 20 (L) 10/29/2021 1055   GFRAA 25 (L) 09/13/2019 1243       Component Value Date/Time   WBC 9.5 10/29/2021 1055   RBC 4.64 10/29/2021 1055   HGB 14.1 10/29/2021 1055   HCT 44.1 10/29/2021 1055   PLT 214 10/29/2021 1055   MCV 95.0 10/29/2021 1055   MCH 30.4 10/29/2021 1055   MCHC 32.0 10/29/2021 1055   RDW 13.2 10/29/2021 1055   LYMPHSABS 3.2 07/02/2021 1048   MONOABS 0.7 07/02/2021 1048   EOSABS 0.2 07/02/2021 1048   BASOSABS 0.1 07/02/2021 1048    Lithium Lvl  Date Value Ref Range Status  05/22/2021 0.7 0.6 - 1.2 mmol/L Final     No results found for: "PHENYTOIN", "PHENOBARB", "VALPROATE", "CBMZ"   .res Assessment: Plan:    Plan:  Therapist - Christina Hussami - [facility]  Trazadone '50mg'$  - 1 to 2 at bedtime Lorazepam '1mg'$  BID for anxiety - may take one tablet extra for severe anxiety symptoms. Taking one at night routinely. Lamictal '200mg'$  hs Lamictal '150mg'$  every morning.  Consider low dose of Gabapentin.  NAC tablets BID - for obsessive thoughts, worry, and rumination - unable to take SSRI's  RTC weekly  Seeing Endocrinologist on 10/23  Counseled patient regarding potential benefits, risks, and side effects of Lamictal to include potential risk of Stevens-Johnson syndrome. Advised patient to stop taking Lamictal and contact office immediately if rash develops and to seek urgent medical attention if rash is severe and/or spreading quickly.  Discussed  potential benefits, risk, and side effects of benzodiazepines to include potential risk of tolerance and dependence, as well as possible drowsiness. Advised  patient not to drive if experiencing drowsiness and to take lowest possible effective dose to minimize risk of dependence and tolerance.   Diagnoses and all orders for this visit:  Bipolar I disorder ([place])  Generalized anxiety disorder  PTSD (post-traumatic stress disorder)  Insomnia due to other mental disorder     Please see After Visit Summary for patient specific instructions.  Future Appointments  Date Time Provider [place]  01/10/2022  2:00 PM Hussami, Rachel Bo, LCSW [facility] None  01/29/2022  2:00 PM Hussami, Christina R, LCSW [facility] None  02/13/2022  2:00 PM Hussami, Christina R, LCSW [facility] None  03/06/2022 11:45 AM [facility] LAB [facility] None  04/15/2022  1:45 PM [facility] HEALTH COACH [facility]EC  05/08/2022 12:10 PM Shamleffer, Melanie Crazier, MD [facility] None    No orders of the defined types were placed in this encounter.   -------------------------------

## 2022-01-10 ENCOUNTER — Ambulatory Visit (INDEPENDENT_AMBULATORY_CARE_PROVIDER_SITE_OTHER): Payer: Medicare Other | Admitting: Licensed Clinical Social Worker

## 2022-01-10 DIAGNOSIS — F4310 Post-traumatic stress disorder, unspecified: Secondary | ICD-10-CM | POA: Diagnosis not present

## 2022-01-10 DIAGNOSIS — F411 Generalized anxiety disorder: Secondary | ICD-10-CM

## 2022-01-10 DIAGNOSIS — F319 Bipolar disorder, unspecified: Secondary | ICD-10-CM

## 2022-01-10 NOTE — Patient Instructions (Signed)
Outpatient Psychotherapy Resources   [facility] *  (Intensive outpatient, partial hospitalization, individual therapy, and medication management)  [address] [place], [place] [address]  [phone]   [facility] *  (medication management and therapy)  [place], [place] [address]  [phone]   [facility] (Therapy only)*  (takes [place] and most major insurances)  [place]:   [place], [name]   Phone: [phone]  [place]:   [address], [place], [place] [address]    Phone: [phone]  [place]:   [address] [place], [place] [address]   Phone: [phone]   [facility]    (specializes in trauma therapy, substance abuse and mood disorders. Takes patients without insurance for little to no cost out of pocket. Also takes most major insurances)  [address],   [place], [place] [address]  [facility] *  (therapy and medication management)  [place], [place] [address]  [phone]   [facility]  (Medication Management)  [place], [place] [address]  [phone]   [facility]  (therapy only)  [address] [place], [place] [address]  [phone]   [facility]  [address]  [place] [address]  [phone]   [facility] *  [address]  [place], [place] [address]  [phone]   [facility]*  (accepts non-insured)   [address] [place], [place] [address]  [phone]  Walk in Monday-Friday 8am-3pm   [facility]*  (takes sliding scale and Medicaid as well as other major insurances)    [place]:    [address], [name]   Phone: [phone]   [place]:    [facility] [address], [place], [name]    Phone: [phone]   Walk in Monday-Friday 830am-230pm   [facility]  (accepts [place])  [address]  [place],  [address]  [phone]  Walk in Monday-Friday 8am-5pm

## 2022-01-10 NOTE — Plan of Care (Signed)
  Problem: Bipolar Disorder  Goal: Decrease depressive/manic symptoms and return to improved levels of effective functioning per pt self report 3 out of 5 sessions documented.   Outcome: Not Progressing   Problem: Depression Goal:  Decrease depressive symptoms and improve levels of effective functioning-pt reports a decrease in overall depression symptoms 3 out of 5 sessions documented.  Outcome: Not Progressing Goal: Develop healthy thinking patterns and beliefs about self, others, and the world that lead to the alleviation and help prevent the relapse of depression per self report 3 out of 5 sessions documented.   Outcome: Not Progressing

## 2022-01-10 NOTE — Progress Notes (Signed)
THERAPIST PROGRESS NOTE  Session Time: 205-215p  [place] in office visit for patient and LCSW clinician  Participation Level: Active  Behavioral Response: Neat and Well GroomedAlertAnxious and Depressed  Type of Therapy: Individual Therapy  Treatment Goals addressed: Problem: Bipolar Disorder  Goal: Decrease depressive/manic symptoms and return to improved levels of effective functioning per pt self report 3 out of 5 sessions documented.   Outcome: Not Progressing   Problem: Depression Goal:  Decrease depressive symptoms and improve levels of effective functioning-pt reports a decrease in overall depression symptoms 3 out of 5 sessions documented.  Outcome: Not Progressing Goal: Develop healthy thinking patterns and beliefs about self, others, and the world that lead to the alleviation and help prevent the relapse of depression per self report 3 out of 5 sessions documented.   Outcome: Not Progressing   ProgressTowards Goals: Not Progressing  Interventions: Solution Focused and Supportive  Summary: Tiffany [name] is a 71 y.o. female who presents with symptoms related to bipolar disorder. Pt presents initially as very sad and states that she is continuing to experience racing thoughts, rapid mood cycling, and insomnia at times. Pt states that she was motivated to start a fitness program but feels she has done too much, too fast. Encouraged pt to set small manageable goals for self.   Pt states that the atmospheric pressure has made her joints hurt more and her head hurt more, so she is feeling more chronic pain today.   Pt made the statement that she really doesn't want to continue counseling "you are very nice and I have a good connection with you, but I need someone that can take my phone calls when I need to talk with someone and I need someone that can see me weekly or more". Pt at that time started discussing how uncomfortable she was sitting in the  lobby waiting for her appointment because the TV was too loud. Pt then started screaming, then stating "I'm so frustrated".  Allowed pt safe space to explore and express. Pt then stated that "I didn't really want to come in today because I need somebody that I can see weekly and that can take my phone calls every time I call. I need to find another therapist and I don't want to get too connected with you". Pt then got up and exited the treatment room. LCSW clinician escorted pt safely down the hallway (pt did not look back). LCSW clinician asked pt if she wanted to cancel the already-scheduled appointments in advance--pt looked at clinician, screamed again, and said "you might as well", then entered back into the lobby with her husband.    LCSW clinician will send outpatient resources via [place].   Suicidal/Homicidal: No  Therapist Response: Pt states at visit that she does not wish to continue counseling (or even present visit) because she needs someone who is available to her by telephone and can see her on a weekly basis.   Plan: Pt elects to seek another counseling provider  Encounter Diagnoses  Name Primary?   Bipolar I disorder ([place]) Yes   PTSD (post-traumatic stress disorder)    Generalized anxiety disorder    Collaboration of Care: Other Pt encouraged to continue services with psychiatrist of record--Regina Mozingo.   Patient/Guardian was advised Release of Information must be obtained prior to any record release in order to collaborate their care with an outside provider. Patient/Guardian was advised if they have not already done so to contact the  registration department to sign all necessary forms in order for [place] to release information regarding their care.   Consent: Patient/Guardian gives verbal consent for treatment and assignment of benefits for services provided during this visit. Patient/Guardian expressed understanding and agreed to proceed.   [place],  LCSW 01/10/2022

## 2022-01-11 ENCOUNTER — Telehealth: Payer: Self-pay | Admitting: Adult Health

## 2022-01-11 NOTE — Telephone Encounter (Signed)
Tiffany [name] called this monring at 9:20 reporting that she is not sleeping.  So only got about 3hrs sleep last night and it is starting to affect her mental health.  She took 1 1/2 trazodone's last night with an Ativan.  She wants to know how much trazodone she can take to help getting some sleep.  Please call to discuss.  Appt 11/7

## 2022-01-11 NOTE — Telephone Encounter (Signed)
Rx for trazodone is written to take 1-2. Patient said she has only taken up to 1.5 tabs. She will try taking 2 and let us know how she is doing.

## 2022-01-15 ENCOUNTER — Encounter: Payer: Self-pay | Admitting: Adult Health

## 2022-01-15 ENCOUNTER — Ambulatory Visit (INDEPENDENT_AMBULATORY_CARE_PROVIDER_SITE_OTHER): Payer: Medicare Other | Admitting: Adult Health

## 2022-01-15 ENCOUNTER — Encounter: Payer: Self-pay | Admitting: Internal Medicine

## 2022-01-15 DIAGNOSIS — F411 Generalized anxiety disorder: Secondary | ICD-10-CM | POA: Diagnosis not present

## 2022-01-15 DIAGNOSIS — F99 Mental disorder, not otherwise specified: Secondary | ICD-10-CM

## 2022-01-15 DIAGNOSIS — F4310 Post-traumatic stress disorder, unspecified: Secondary | ICD-10-CM | POA: Diagnosis not present

## 2022-01-15 DIAGNOSIS — F319 Bipolar disorder, unspecified: Secondary | ICD-10-CM | POA: Diagnosis not present

## 2022-01-15 DIAGNOSIS — F5105 Insomnia due to other mental disorder: Secondary | ICD-10-CM

## 2022-01-15 NOTE — Progress Notes (Signed)
Tiffany [name] [id number] October 04, 1950 71 y.o.  Subjective:   Patient ID:  Tiffany [name] is a 71 y.o. (DOB 1950-10-29) female.  Chief Complaint: No chief complaint on file.   HPI Tiffany [name] presents to the office today for follow-up of PTSD, insomnia, GAD and  BPD 1.  Accompanied by husband.  Describes mood today as "not too good". Pleasant. Tearful during interview. Mood symptoms - reports depression, anxiety and irritability. Reports some worry, rumination and overthinking. Reports mood instability - having periods of hypo-mania - lashing out. Stating "I'm not doing good". Plans to call for a follow up with endocrinology today - thyroid check with mood changes. Recovering from recent illness. Feels like medications are helpful. Supportive family. Seeing Earlie Server - therapist - saw last week - therapist plans to present a plan at next visit. Saw Christina Hussami last week - last visit. Followed by nephrology - endocrinology. Varying interest and motivation. Taking medications as prescribed. Energy levels stable. Working with P/T. Active, plans to start working with a personal trainer - operating machines, etc so she can develop a regular exercise routine within her physical limitations.  Enjoys some usual interests and activities - getting out with husband. Also driving independently. Married. Lives with husband their daughter. Talking to family and friends.  Appetite improved. Weight loss 196 from 201 pounds Sleep is variable. Averages 6.5 to 7 hours of broken sleep. Focus and concentration varies. Completing tasks. Managing some aspects of household. Retired.  Denies SI or HI.  Denies AH or VH  Denies self harm. Denies substance use.  Working with P/T.   [facility] Office Visit from 02/16/2021 in [facility] Visit from 05/14/2019 in [facility]  Total GAD-7 Score 13 [facility] Office  Visit from 06/07/2019 in [facility]  Total Score (max 30 points ) 29      PHQ2-9    [place] from 11/30/2021 in [facility] Coordination Nutrition from [facility]/06/2021 [facility] Visit from 07/30/2021 in [facility] Visit from 07/02/2021 in [facility] from 04/02/2021 in [facility]  PHQ-2 Total Score 0 0 '2 1 1  '$ PHQ-9 Total Score -- -- '11 6 1      '$ Flowsheet Row ED from 11/26/2021 in [facility] Clinical Support from 04/02/2021 in [facility] from 03/21/2021 in [facility]  C-SSRS RISK CATEGORY No Risk No Risk No Risk        Review of Systems:  Review of Systems  Musculoskeletal:  Negative for gait problem.  Neurological:  Negative for tremors.  Psychiatric/Behavioral:         Please refer to HPI    Medications: I have reviewed the patient's current medications.  Current Outpatient Medications  Medication Sig Dispense Refill   Acetylcysteine (N-ACETYL CYSTEINE) 600 MG CAPS 1 capsule Orally Twice a day     atorvastatin (LIPITOR) 10 MG tablet Take 1 tablet (10 mg total) by mouth daily. 90 tablet 1   Azelaic Acid 15 % gel Apply topically as needed.     B Complex Vitamins (VITAMIN B COMPLEX) TABS 1 tablet Orally Once daily     benzonatate (TESSALON) 100 MG capsule as needed.     betamethasone dipropionate 8.56 % cream 1 application Externally  Twice a day     Cholecalciferol 50 MCG (2000 UT) TABS 1 tablet Orally Once a day for 30 day(s)     Golimumab (SIMPONI ARIA IV)      lamoTRIgine (LAMICTAL) 100 MG tablet Take one tablet in the morning and take one tablet at lunch. 180 tablet 1   lamoTRIgine (LAMICTAL) 200 MG tablet Take 1 tablet (200 mg total) by mouth at bedtime. 90 tablet 3   levothyroxine (SYNTHROID) 100  MCG tablet Take 1 tablet (100 mcg total) by mouth daily at 6 (six) AM. 90 tablet 3   LORazepam (ATIVAN) 1 MG tablet TAKE 1 TABLET(1 MG) BY MOUTH TWICE DAILY AS NEEDED 60 tablet 2   metroNIDAZOLE (METROGEL) 0.75 % gel Apply to face 1-2 times daily 45 g 1   Omega-3 Fatty Acids (FISH OIL) 1200 MG CAPS Take 1 capsule by mouth in the morning and at bedtime.     sodium bicarbonate 650 MG tablet Take 650 mg by mouth 2 (two) times daily.     traZODone (DESYREL) 50 MG tablet Take one to two tablets at bedtime. 90 tablet 3   No current facility-administered medications for this visit.    Medication Side Effects: None  Allergies:  Allergies  Allergen Reactions   Aripiprazole Other (See Comments)    Parkinsonism      Lactose Intolerance (Gi) Diarrhea   Methotrexate Other (See Comments)    Hair loss, severe stomatitis     Cefdinir Diarrhea    Other reaction(s): Diarrhea Yeast infection and fever; negative c diff   Etanercept Other (See Comments)    Headaches     Exemestane Other (See Comments)    Suicidal thoughts with medication     Fluoxetine Other (See Comments)    Parkinsonism   Methylprednisolone Sodium Succ Other (See Comments)    Agitated mania   Epinephrine Palpitations    tachycardia    Nitrofurantoin Nausea And Vomiting and Rash      Other reaction(s): rash, diarrhea Other reaction(s): rash, diarrhea Other reaction(s): rash, diarrhea Other reaction(s): rash, diarrhea Other reaction(s): rash, diarrhea Other reaction(s): rash, diarrhea    Past Medical History:  Diagnosis Date   Bipolar 1 disorder ([place])    Breast cancer ([place])    Chronic diarrhea    loose stools twice a day on average for years   Chronic kidney disease (CKD), stage III (moderate) ([place])    Depression 1987   Family history of breast cancer    Hospitalization or health care facility admission within last 6 months 04/2019   for fall/seizures   Malignant neoplasm of overlapping sites of left breast  in female, estrogen receptor positive ([place]) 03/14/2016   Dx in 09/2014, s/p bilateral mastectomies and ALND, 0/10 LN. 1.4 cm  Grade I invasive lobular, ER and PR +/Her--, Ki 67 <5% Tried anastrozole for one month, but developed suicidal idea   Osteoarthritis, knee 09/24/2019   Xray 09/2019   Parkinson's disease 2012   Personal history of malignant neoplasm of breast    Polyposis of colon    Psoriatic arthritis ([place])    PTSD (post-traumatic stress disorder)    Secondary hyperparathyroidism ([place])    Tardive dyskinesia    Thyroid disease     Past Medical History, Surgical history, Social history, and Family history were reviewed and updated as appropriate.   Please see review of systems for further details on the patient's review from today.   Objective:   Physical Exam:  There were no vitals  taken for this visit.  Physical Exam Constitutional:      General: She is not in acute distress. Musculoskeletal:        General: No deformity.  Neurological:     Mental Status: She is alert and oriented to person, place, and time.     Coordination: Coordination normal.  Psychiatric:        Attention and Perception: Attention and perception normal. She does not perceive auditory or visual hallucinations.        Mood and Affect: Mood normal. Mood is not anxious or depressed. Affect is not labile, blunt, angry or inappropriate.        Speech: Speech normal.        Behavior: Behavior normal.        Thought Content: Thought content normal. Thought content is not paranoid or delusional. Thought content does not include homicidal or suicidal ideation. Thought content does not include homicidal or suicidal plan.        Cognition and Memory: Cognition and memory normal.        Judgment: Judgment normal.     Comments: Insight intact     Lab Review:     Component Value Date/Time   NA 142 12/10/2021 1044   NA 142 10/17/2021 0000   K 4.8 12/10/2021 1044   CL 109 12/10/2021 1044   CO2 24  12/10/2021 1044   GLUCOSE 88 12/10/2021 1044   BUN 36 (H) 12/10/2021 1044   BUN 30 (A) 10/17/2021 0000   CREATININE 2.45 (H) 12/10/2021 1044   CREATININE 2.29 (H) 05/22/2021 1044   CALCIUM 9.5 12/10/2021 1044   PROT 7.0 12/10/2021 1044   ALBUMIN 4.4 12/10/2021 1044   AST 14 12/10/2021 1044   ALT 12 12/10/2021 1044   ALKPHOS 122 (H) 12/10/2021 1044   BILITOT 0.7 12/10/2021 1044   GFRNONAA 20 (L) 10/29/2021 1055   GFRAA 25 (L) 09/13/2019 1243       Component Value Date/Time   WBC 9.5 10/29/2021 1055   RBC 4.64 10/29/2021 1055   HGB 14.1 10/29/2021 1055   HCT 44.1 10/29/2021 1055   PLT 214 10/29/2021 1055   MCV 95.0 10/29/2021 1055   MCH 30.4 10/29/2021 1055   MCHC 32.0 10/29/2021 1055   RDW 13.2 10/29/2021 1055   LYMPHSABS 3.2 07/02/2021 1048   MONOABS 0.7 07/02/2021 1048   EOSABS 0.2 07/02/2021 1048   BASOSABS 0.1 07/02/2021 1048    [place] Lvl  Date Value Ref Range Status  05/22/2021 0.7 0.6 - 1.2 mmol/L Final     No results found for: "PHENYTOIN", "PHENOBARB", "VALPROATE", "CBMZ"   .res Assessment: Plan:    Plan:  Therapist - Christina Hussami - [facility] '50mg'$  - 1 to 2 at bedtime Lorazepam '1mg'$  BID for anxiety - may take one tablet extra for severe anxiety symptoms. Taking one at night routinely. Lamictal '200mg'$  hs Lamictal '150mg'$  every morning.  Consider low dose of Gabapentin.  NAC tablets BID - for obsessive thoughts, worry, and rumination - unable to take SSRI's  RTC weekly  Seeing Endocrinologist on 10/23  Counseled patient regarding potential benefits, risks, and side effects of Lamictal to include potential risk of Stevens-Johnson syndrome. Advised patient to stop taking Lamictal and contact office immediately if rash develops and to seek urgent medical attention if rash is severe and/or spreading quickly.  Discussed potential benefits, risk, and side effects of benzodiazepines to include potential risk of tolerance and dependence, as well  as possible drowsiness. Advised  patient not to drive if experiencing drowsiness and to take lowest possible effective dose to minimize risk of dependence and tolerance.   Diagnoses and all orders for this visit:  Bipolar I disorder ([place])  PTSD (post-traumatic stress disorder)  Generalized anxiety disorder  Insomnia due to other mental disorder     Please see After Visit Summary for patient specific instructions.  Future Appointments  Date Time Provider [place]  01/22/2022 11:20 AM Ayahna Solazzo, Berdie Ogren, NP [facility] None  01/29/2022 10:40 AM Caulin Begley, Berdie Ogren, NP [facility] None  02/05/2022 11:20 AM Hyun Reali, Berdie Ogren, NP [facility] None  03/06/2022 11:45 AM [facility] None  04/15/2022  1:45 PM [facility] HEALTH COACH [facility]  05/08/2022 12:10 PM Shamleffer, Melanie Crazier, MD [facility] None    No orders of the defined types were placed in this encounter.   -------------------------------

## 2022-01-22 ENCOUNTER — Ambulatory Visit (INDEPENDENT_AMBULATORY_CARE_PROVIDER_SITE_OTHER): Payer: Medicare Other | Admitting: Adult Health

## 2022-01-22 ENCOUNTER — Encounter: Payer: Self-pay | Admitting: Adult Health

## 2022-01-22 DIAGNOSIS — F99 Mental disorder, not otherwise specified: Secondary | ICD-10-CM

## 2022-01-22 DIAGNOSIS — F4310 Post-traumatic stress disorder, unspecified: Secondary | ICD-10-CM

## 2022-01-22 DIAGNOSIS — F411 Generalized anxiety disorder: Secondary | ICD-10-CM | POA: Diagnosis not present

## 2022-01-22 DIAGNOSIS — F319 Bipolar disorder, unspecified: Secondary | ICD-10-CM | POA: Diagnosis not present

## 2022-01-22 DIAGNOSIS — F5105 Insomnia due to other mental disorder: Secondary | ICD-10-CM | POA: Diagnosis not present

## 2022-01-22 NOTE — Progress Notes (Signed)
Tiffany [name] [id number] 1950-06-06 71 y.o.  Subjective:   Patient ID:  Tiffany [name] is a 71 y.o. (DOB 03-03-1951) female.  Chief Complaint: No chief complaint on file.   HPI Tiffany [name] presents to the office today for follow-up of PTSD, insomnia, GAD and  BPD 1.  Accompanied by husband.  Describes mood today as "better". Pleasant. Denies tearfulness. Mood symptoms - denies depression, anxiety and irritability - symptoms improved over past day. Stating "today, I feel better". Continues to report worry, rumination and overthinking. Reports mood has improved - cycling - currently feeling better. Contacted endocrinologist about mood and was advised to continue current dose until upcoming appointment. Followed by nephrology - appointment tomorrow. Recovering from recent illness - cough. Feels like medications are helpful. Supportive family. Seeing Earlie Server - therapist - discussing a plan of care at upcoming visit. Varying interest and motivation. Taking medications as prescribed. Energy levels stable. Working with P/T. Active, plans to start working with a Physiological scientist - swimming. Enjoys some usual interests and activities - getting out with husband. Also driving independently. Married. Lives with husband their daughter. Talking to family and friends.  Appetite improved. Weight loss 191 from 201 pounds Sleep is variable. Averages 7 to 8 hours of broken sleep. Focus and concentration varies. Completing tasks. Managing some aspects of household. Retired.  Denies SI or HI.  Denies AH or VH  Denies self harm. Denies substance use.  Working with P/T.   [place] Office Visit from 02/16/2021 in [place] Visit from 05/14/2019 in [facility]  Total GAD-7 Score 13 [place] Office Visit from 06/07/2019 in [facility]  Total Score (max 30 points ) 29      PHQ2-9     [place] from 11/30/2021 in [facility] Nutrition from 11/06/2021 [facility] Visit from 07/30/2021 in [place] Visit from 07/02/2021 in [facility] from 04/02/2021 in [facility]  PHQ-2 Total Score 0 0 '2 1 1  '$ PHQ-9 Total Score -- -- '11 6 1      '$ Flowsheet Row ED from 11/26/2021 in [facility] Clinical Support from 04/02/2021 in [facility] from 03/21/2021 in [facility]  C-SSRS RISK CATEGORY No Risk No Risk No Risk        Review of Systems:  Review of Systems  Musculoskeletal:  Negative for gait problem.  Neurological:  Negative for tremors.  Psychiatric/Behavioral:         Please refer to HPI    Medications: I have reviewed the patient's current medications.  Current Outpatient Medications  Medication Sig Dispense Refill   Acetylcysteine (N-ACETYL CYSTEINE) 600 MG CAPS 1 capsule Orally Twice a day     atorvastatin (LIPITOR) 10 MG tablet Take 1 tablet (10 mg total) by mouth daily. 90 tablet 1   Azelaic Acid 15 % gel Apply topically as needed.     B Complex Vitamins (VITAMIN B COMPLEX) TABS 1 tablet Orally Once daily     benzonatate (TESSALON) 100 MG capsule as needed.     betamethasone dipropionate 2.67 % cream 1 application Externally Twice a day     Cholecalciferol 50 MCG (2000 UT) TABS 1 tablet Orally Once a day for 30 day(s)  Golimumab (SIMPONI ARIA IV)      lamoTRIgine (LAMICTAL) 100 MG tablet Take one tablet in the morning and take one tablet at lunch. 180 tablet 1   lamoTRIgine (LAMICTAL) 200 MG tablet Take 1 tablet (200 mg total) by mouth at bedtime. 90 tablet 3   levothyroxine (SYNTHROID) 100 MCG tablet Take 1 tablet (100 mcg total) by mouth daily at 6 (six) AM. 90 tablet 3   LORazepam  (ATIVAN) 1 MG tablet TAKE 1 TABLET(1 MG) BY MOUTH TWICE DAILY AS NEEDED 60 tablet 2   metroNIDAZOLE (METROGEL) 0.75 % gel Apply to face 1-2 times daily 45 g 1   Omega-3 Fatty Acids (FISH OIL) 1200 MG CAPS Take 1 capsule by mouth in the morning and at bedtime.     sodium bicarbonate 650 MG tablet Take 650 mg by mouth 2 (two) times daily.     traZODone (DESYREL) 50 MG tablet Take one to two tablets at bedtime. 90 tablet 3   No current facility-administered medications for this visit.    Medication Side Effects: None  Allergies:  Allergies  Allergen Reactions   Aripiprazole Other (See Comments)    Parkinsonism      Lactose Intolerance (Gi) Diarrhea   Methotrexate Other (See Comments)    Hair loss, severe stomatitis     Cefdinir Diarrhea    Other reaction(s): Diarrhea Yeast infection and fever; negative c diff   Etanercept Other (See Comments)    Headaches     Exemestane Other (See Comments)    Suicidal thoughts with medication     Fluoxetine Other (See Comments)    Parkinsonism   Methylprednisolone Sodium Succ Other (See Comments)    Agitated mania   Epinephrine Palpitations    tachycardia    Nitrofurantoin Nausea And Vomiting and Rash      Other reaction(s): rash, diarrhea Other reaction(s): rash, diarrhea Other reaction(s): rash, diarrhea Other reaction(s): rash, diarrhea Other reaction(s): rash, diarrhea Other reaction(s): rash, diarrhea    Past Medical History:  Diagnosis Date   Bipolar 1 disorder ([place])    Breast cancer ([place])    Chronic diarrhea    loose stools twice a day on average for years   Chronic kidney disease (CKD), stage III (moderate) ([place])    Depression 1987   Family history of breast cancer    Hospitalization or health care facility admission within last 6 months 04/2019   for fall/seizures   Malignant neoplasm of overlapping sites of left breast in female, estrogen receptor positive ([place]) 03/14/2016   Dx in 09/2014, s/p bilateral  mastectomies and ALND, 0/10 LN. 1.4 cm  Grade I invasive lobular, ER and PR +/Her--, Ki 67 <5% Tried anastrozole for one month, but developed suicidal idea   Osteoarthritis, knee 09/24/2019   Xray 09/2019   Parkinson's disease 2012   Personal history of malignant neoplasm of breast    Polyposis of colon    Psoriatic arthritis ([place])    PTSD (post-traumatic stress disorder)    Secondary hyperparathyroidism ([place])    Tardive dyskinesia    Thyroid disease     Past Medical History, Surgical history, Social history, and Family history were reviewed and updated as appropriate.   Please see review of systems for further details on the patient's review from today.   Objective:   Physical Exam:  There were no vitals taken for this visit.  Physical Exam Constitutional:      General: She is not in acute distress. Musculoskeletal:  General: No deformity.  Neurological:     Mental Status: She is alert and oriented to person, place, and time.     Coordination: Coordination normal.  Psychiatric:        Attention and Perception: Attention and perception normal. She does not perceive auditory or visual hallucinations.        Mood and Affect: Mood normal. Mood is not anxious or depressed. Affect is not labile, blunt, angry or inappropriate.        Speech: Speech normal.        Behavior: Behavior normal.        Thought Content: Thought content normal. Thought content is not paranoid or delusional. Thought content does not include homicidal or suicidal ideation. Thought content does not include homicidal or suicidal plan.        Cognition and Memory: Cognition and memory normal.        Judgment: Judgment normal.     Comments: Insight intact     Lab Review:     Component Value Date/Time   NA 142 12/10/2021 1044   NA 142 10/17/2021 0000   K 4.8 12/10/2021 1044   CL 109 12/10/2021 1044   CO2 24 12/10/2021 1044   GLUCOSE 88 12/10/2021 1044   BUN 36 (H) 12/10/2021 1044   BUN 30 (A)  10/17/2021 0000   CREATININE 2.45 (H) 12/10/2021 1044   CREATININE 2.29 (H) 05/22/2021 1044   CALCIUM 9.5 12/10/2021 1044   PROT 7.0 12/10/2021 1044   ALBUMIN 4.4 12/10/2021 1044   AST 14 12/10/2021 1044   ALT 12 12/10/2021 1044   ALKPHOS 122 (H) 12/10/2021 1044   BILITOT 0.7 12/10/2021 1044   GFRNONAA 20 (L) 10/29/2021 1055   GFRAA 25 (L) 09/13/2019 1243       Component Value Date/Time   WBC 9.5 10/29/2021 1055   RBC 4.64 10/29/2021 1055   HGB 14.1 10/29/2021 1055   HCT 44.1 10/29/2021 1055   PLT 214 10/29/2021 1055   MCV 95.0 10/29/2021 1055   MCH 30.4 10/29/2021 1055   MCHC 32.0 10/29/2021 1055   RDW 13.2 10/29/2021 1055   LYMPHSABS 3.2 07/02/2021 1048   MONOABS 0.7 07/02/2021 1048   EOSABS 0.2 07/02/2021 1048   BASOSABS 0.1 07/02/2021 1048    L[place]ium Lvl  Date Value Ref Range Status  05/22/2021 0.7 0.6 - 1.2 mmol/L Final     No results found for: "PHENYTOIN", "PHENOBARB", "VALPROATE", "CBMZ"   .res Assessment: Plan:    Plan:  Therapist - Christina Hussami - [facility]  Trazadone '50mg'$  - 1 to 2 at bedtime Lorazepam '1mg'$  BID for anxiety - may take one tablet extra for severe anxiety symptoms. Taking one at night routinely. Lamictal '200mg'$  hs Lamictal '150mg'$  every morning.  Consider low dose of Gabapentin.  NAC tablets BID - for obsessive thoughts, worry, and rumination - unable to take SSRI's  RTC weekly  Seeing Endocrinologist on 10/23  Counseled patient regarding potential benefits, risks, and side effects of Lamictal to include potential risk of Stevens-Johnson syndrome. Advised patient to stop taking Lamictal and contact office immediately if rash develops and to seek urgent medical attention if rash is severe and/or spreading quickly.  Discussed potential benefits, risk, and side effects of benzodiazepines to include potential risk of tolerance and dependence, as well as possible drowsiness. Advised patient not to drive if experiencing drowsiness and to  take lowest possible effective dose to minimize risk of dependence and tolerance.  There are no diagnoses linked to  this encounter.   Please see After Visit Summary for patient specific instructions.  Future Appointments  Date Time Provider [place]  01/29/2022 10:40 AM Kaytlyn Din, Berdie Ogren, NP [facility] None  02/05/2022 11:20 AM Caridad Silveira, Berdie Ogren, NP [facility] None  03/06/2022 11:45 AM [facility] None  04/15/2022  1:45 PM [facility] HEALTH COACH [facility]  05/08/2022 12:10 PM Shamleffer, Melanie Crazier, MD [facility] None    No orders of the defined types were placed in this encounter.   -------------------------------

## 2022-01-23 ENCOUNTER — Ambulatory Visit (INDEPENDENT_AMBULATORY_CARE_PROVIDER_SITE_OTHER): Payer: Medicare Other | Admitting: Physician Assistant

## 2022-01-23 ENCOUNTER — Encounter: Payer: Self-pay | Admitting: Physician Assistant

## 2022-01-23 VITALS — BP 128/74 | HR 77 | Temp 98.2°F | Ht 64.0 in | Wt 199.0 lb

## 2022-01-23 DIAGNOSIS — R059 Cough, unspecified: Secondary | ICD-10-CM

## 2022-01-23 LAB — POC INFLUENZA A&B (BINAX/QUICKVUE)
Influenza A, POC: NEGATIVE
Influenza B, POC: NEGATIVE

## 2022-01-23 LAB — POC COVID19 BINAXNOW: SARS Coronavirus 2 Ag: NEGATIVE

## 2022-01-23 MED ORDER — DOXYCYCLINE HYCLATE 100 MG PO TABS: 100.0000 mg | ORAL_TABLET | Freq: Two times a day (BID) | ORAL | 0 refills | Status: DC

## 2022-01-23 NOTE — Patient Instructions (Signed)
It was great to see you!  Negative COVID and flu tests  Start doxycycline for your upper respiratory infection  Push fluids and get plenty of rest. Please return if you are not improving as expected, or if you have high fevers (>101.5) or difficulty swallowing or worsening productive cough.  Call clinic with questions.  I hope you start feeling better soon!

## 2022-01-23 NOTE — Progress Notes (Signed)
Tiffany [name] is a 71 y.o. female here for a new problem.  History of Present Illness:   Chief Complaint  Patient presents with   Cough    Pt c/o cough x 10 days, coughing and expectorating yellow/green sputum. Had fever yesterday 100.4 took Tylenol.    HPI  Cough Patient reports that she had a productive cough with yellow/green sputum starting 10 days ago. She states that her grandchildren have been sick, but no confirmed diagnosis. Patient explains that 3-4 days ago, she felt like she was getting better, but still coughing. She states that she had a 100.4 fever yesterday, but no fever today. Her husband shares the same symptoms. She reports that she is consuming food and water fine, but does not have the same appetite. She does not manage symptoms with any OTC products.  She denies CP, SOB, chills, body aches, nasal congestion, sore throat, sinus pressure, and sinus pain.  Past Medical History:  Diagnosis Date   Bipolar 1 disorder ([place])    Breast cancer ([place])    Chronic diarrhea    loose stools twice a day on average for years   Chronic kidney disease (CKD), stage III (moderate) ([place])    Depression 1987   Family history of breast cancer    Hospitalization or health care facility admission within last 6 months 04/2019   for fall/seizures   Malignant neoplasm of overlapping sites of left breast in female, estrogen receptor positive ([place]) 03/14/2016   Dx in 09/2014, s/p bilateral mastectomies and ALND, 0/10 LN. 1.4 cm  Grade I invasive lobular, ER and PR +/Her--, Ki 67 <5% Tried anastrozole for one month, but developed suicidal idea   Osteoarthritis, knee 09/24/2019   Xray 09/2019   Parkinson's disease 2012   Personal history of malignant neoplasm of breast    Polyposis of colon    Psoriatic arthritis ([place])    PTSD (post-traumatic stress disorder)    Secondary hyperparathyroidism ([place])    Tardive dyskinesia    Thyroid disease      Social History   Tobacco Use   Smoking  status: Never   Smokeless tobacco: Never  Vaping Use   Vaping Use: Never used  Substance Use Topics   Alcohol use: Never   Drug use: Never    Past Surgical History:  Procedure Laterality Date   ABDOMINAL HYSTERECTOMY  1987   CHOLECYSTECTOMY  1979   DILATION AND CURETTAGE OF UTERUS  1973   MASTECTOMY Bilateral 09/19/2014   TONSILLECTOMY  1970   URETERAL REIMPLANTION Bilateral 1974    Family History  Problem Relation Age of Onset   Lymphoma Mother 93   Lymphoma Sister 66   Breast cancer Sister 93   Colon polyps Sister    Lung cancer Sister        former smoker   Stroke Maternal Grandfather    Diabetes Paternal Grandfather    Colon cancer Neg Hx    Esophageal cancer Neg Hx    Stomach cancer Neg Hx    Rectal cancer Neg Hx     Allergies  Allergen Reactions   Aripiprazole Other (See Comments)    Parkinsonism      Lactose Intolerance (Gi) Diarrhea   Methotrexate Other (See Comments)    Hair loss, severe stomatitis     Cefdinir Diarrhea    Other reaction(s): Diarrhea Yeast infection and fever; negative c diff   Etanercept Other (See Comments)    Headaches     Exemestane Other (See Comments)  Suicidal thoughts with medication     Fluoxetine Other (See Comments)    Parkinsonism   Methylprednisolone Sodium Succ Other (See Comments)    Agitated mania   Epinephrine Palpitations    tachycardia    Nitrofurantoin Nausea And Vomiting and Rash      Other reaction(s): rash, diarrhea Other reaction(s): rash, diarrhea Other reaction(s): rash, diarrhea Other reaction(s): rash, diarrhea Other reaction(s): rash, diarrhea Other reaction(s): rash, diarrhea    Current Medications:   Current Outpatient Medications:    Acetylcysteine (N-ACETYL CYSTEINE) 600 MG CAPS, 1 capsule Orally Twice a day, Disp: , Rfl:    atorvastatin (LIPITOR) 10 MG tablet, Take 1 tablet (10 mg total) by mouth daily., Disp: 90 tablet, Rfl: 1   Azelaic Acid 15 % gel, Apply topically as  needed., Disp: , Rfl:    B Complex Vitamins (VITAMIN B COMPLEX) TABS, 1 tablet Orally Once daily, Disp: , Rfl:    benzonatate (TESSALON) 100 MG capsule, as needed., Disp: , Rfl:    betamethasone dipropionate 0.93 % cream, 1 application Externally Twice a day, Disp: , Rfl:    Cholecalciferol 50 MCG (2000 UT) TABS, 1 tablet Orally Once a day for 30 day(s), Disp: , Rfl:    Golimumab (SIMPONI ARIA IV), , Disp: , Rfl:    lamoTRIgine (LAMICTAL) 100 MG tablet, Take one tablet in the morning and take one tablet at lunch., Disp: 180 tablet, Rfl: 1   lamoTRIgine (LAMICTAL) 200 MG tablet, Take 1 tablet (200 mg total) by mouth at bedtime., Disp: 90 tablet, Rfl: 3   levothyroxine (SYNTHROID) 100 MCG tablet, Take 1 tablet (100 mcg total) by mouth daily at 6 (six) AM., Disp: 90 tablet, Rfl: 3   LORazepam (ATIVAN) 1 MG tablet, TAKE 1 TABLET(1 MG) BY MOUTH TWICE DAILY AS NEEDED, Disp: 60 tablet, Rfl: 2   metroNIDAZOLE (METROGEL) 0.75 % gel, Apply to face 1-2 times daily, Disp: 45 g, Rfl: 1   Omega-3 Fatty Acids (FISH OIL) 1200 MG CAPS, Take 1 capsule by mouth in the morning and at bedtime., Disp: , Rfl:    sodium bicarbonate 650 MG tablet, Take 650 mg by mouth 2 (two) times daily., Disp: , Rfl:    traZODone (DESYREL) 50 MG tablet, Take one to two tablets at bedtime., Disp: 90 tablet, Rfl: 3   Review of Systems:   Review of Systems  Constitutional:  Negative for chills.  HENT:  Negative for congestion, sinus pain and sore throat.   Respiratory:  Positive for cough (yellow/green sputum).     Vitals:   Vitals:   01/23/22 1014  BP: 128/74  Pulse: 77  Temp: 98.2 F (36.8 C)  TempSrc: Temporal  SpO2: 97%  Weight: 199 lb (90.3 kg)  Height: '5\' 4"'$  (1.626 m)     Body mass index is 34.16 kg/m.  Physical Exam:   Physical Exam Vitals and nursing note reviewed.  Constitutional:      General: She is not in acute distress.    Appearance: Normal appearance. She is well-developed. She is not ill-appearing  or toxic-appearing.  HENT:     Head: Normocephalic and atraumatic.     Right Ear: Tympanic membrane, ear canal and external ear normal. Tympanic membrane is not erythematous, retracted or bulging.     Left Ear: Tympanic membrane, ear canal and external ear normal. Tympanic membrane is not erythematous, retracted or bulging.     Nose: Nose normal.     Right Sinus: No maxillary sinus tenderness or frontal sinus  tenderness.     Left Sinus: No maxillary sinus tenderness or frontal sinus tenderness.     Mouth/Throat:     Pharynx: Uvula midline. Posterior oropharyngeal erythema present.  Eyes:     General: Lids are normal.     Extraocular Movements: Extraocular movements intact.     Conjunctiva/sclera: Conjunctivae normal.     Pupils: Pupils are equal, round, and reactive to light.  Neck:     Trachea: Trachea normal.  Cardiovascular:     Rate and Rhythm: Normal rate and regular rhythm.     Heart sounds: Normal heart sounds, S1 normal and S2 normal. No murmur heard.    No gallop.  Pulmonary:     Effort: Pulmonary effort is normal. No respiratory distress.     Breath sounds: Normal breath sounds. No decreased breath sounds, wheezing, rhonchi or rales.  Lymphadenopathy:     Cervical: No cervical adenopathy.  Skin:    General: Skin is warm and dry.  Neurological:     Mental Status: She is alert and oriented to person, place, and time.  Psychiatric:        Speech: Speech normal.        Behavior: Behavior normal. Behavior is cooperative.        Judgment: Judgment normal.    Results for orders placed or performed in visit on 01/23/22  POC Influenza A&B(BINAX/QUICKVUE)  Result Value Ref Range   Influenza A, POC Negative Negative   Influenza B, POC Negative Negative  POC COVID-19  Result Value Ref Range   SARS Coronavirus 2 Ag Negative Negative    Assessment and Plan:   Cough, unspecified type No red flags on exam.  Flu and COVID tests are negative. Will initiate doxyxycline per  orders. Discussed taking medications as prescribed. Reviewed return precautions including worsening fever, SOB, worsening cough or other concerns. Push fluids and rest. I recommend that patient follow-up if symptoms worsen or persist despite treatment x 7-10 days, sooner if needed.  I,Verona Buck,acting as a Education administrator for [employer], PA.,have documented all relevant documentation on the behalf of Inda Coke, PA,as directed by  Inda Coke, PA while in the presence of Inda Coke, [place].  I, Inda Coke, [place], have reviewed all documentation for this visit. The documentation on 01/23/22 for the exam, diagnosis, procedures, and orders are all accurate and complete.   Inda Coke, PA-C

## 2022-01-29 ENCOUNTER — Ambulatory Visit (HOSPITAL_COMMUNITY): Payer: Medicare Other | Admitting: Licensed Clinical Social Worker

## 2022-01-29 ENCOUNTER — Encounter: Payer: Self-pay | Admitting: Adult Health

## 2022-01-29 ENCOUNTER — Ambulatory Visit (INDEPENDENT_AMBULATORY_CARE_PROVIDER_SITE_OTHER): Payer: Medicare Other | Admitting: Adult Health

## 2022-01-29 DIAGNOSIS — F411 Generalized anxiety disorder: Secondary | ICD-10-CM

## 2022-01-29 DIAGNOSIS — F5105 Insomnia due to other mental disorder: Secondary | ICD-10-CM

## 2022-01-29 DIAGNOSIS — F319 Bipolar disorder, unspecified: Secondary | ICD-10-CM | POA: Diagnosis not present

## 2022-01-29 DIAGNOSIS — F4310 Post-traumatic stress disorder, unspecified: Secondary | ICD-10-CM

## 2022-01-29 DIAGNOSIS — F99 Mental disorder, not otherwise specified: Secondary | ICD-10-CM

## 2022-01-29 NOTE — Progress Notes (Signed)
Savvy Peeters Corporan [id number] [birth year] 71 y.o.  Subjective:   Patient ID:  Tiffany [name] is a 71 y.o. (DOB 01/08/1951) female.  Chief Complaint: No chief complaint on file.   HPI Tiffany [name] presents to the office today for follow-up of PTSD, insomnia, GAD and  BPD 1.  Accompanied by husband.  Describes mood today as "better". Pleasant. Denies tearfulness. Mood symptoms - reports some depression, anxiety and irritability. Reports worry, rumination and overthinking. Reports mood has improved - less cycling over the past week. Was seen by PCP for respiratory infection - started on doxycycline - feeling better. Saw Nephrologist - CKD not progressing quickly - latest GFR shows improvement with stopping Lithium. Feels like medications are helpful. Supportive family. Seeing Earlie Server - therapist - discussing a plan of care at upcoming visit. Varying interest and motivation. Taking medications as prescribed. Energy levels stable. Working with P/T. Active, plans to start working with a Physiological scientist - swimming. Enjoys some usual interests and activities - getting out with husband. Also driving independently. Married. Lives with husband their daughter. Talking to family and friends.  Appetite improved. Weight loss 190 pounds. Sleep is variable. Averages 8 hours with Trazadone '50mg'$  daily. Focus and concentration varies. Completing tasks. Managing some aspects of household. Retired.  Denies SI or HI.  Denies AH or VH  Denies self harm. Denies substance use.  Working with P/T.   [place] Office Visit from 02/16/2021 in [place] Visit from 05/14/2019 in [place]  Total GAD-7 Score 13 [place] Office Visit from 06/07/2019 in [place]  Total Score (max 30 points ) 29      PHQ2-9    [place] from 11/30/2021 in [place] Coordination Nutrition from 11/06/2021 [facility] Office Visit from 07/30/2021 in [place] Visit from 07/02/2021 in [place] from 04/02/2021 in [place]  PHQ-2 Total Score 0 0 '2 1 1  '$ PHQ-9 Total Score -- -- '11 6 1      '$ Flowsheet Row ED from 11/26/2021 in [facility] Clinical Support from 04/02/2021 in [facility] from 03/21/2021 in [facility]  C-SSRS RISK CATEGORY No Risk No Risk No Risk        Review of Systems:  Review of Systems  Musculoskeletal:  Negative for gait problem.  Neurological:  Negative for tremors.  Psychiatric/Behavioral:         Please refer to HPI    Medications: I have reviewed the patient's current medications.  Current Outpatient Medications  Medication Sig Dispense Refill   Acetylcysteine (N-ACETYL CYSTEINE) 600 MG CAPS 1 capsule Orally Twice a day     atorvastatin (LIPITOR) 10 MG tablet Take 1 tablet (10 mg total) by mouth daily. 90 tablet 1   Azelaic Acid 15 % gel Apply topically as needed.     B Complex Vitamins (VITAMIN B COMPLEX) TABS 1 tablet Orally Once daily     benzonatate (TESSALON) 100 MG capsule as needed.     betamethasone dipropionate 6.28 % cream 1 application Externally Twice a day     Cholecalciferol 50 MCG (2000 UT) TABS 1 tablet Orally Once a day for 30 day(s)     doxycycline (VIBRA-TABS) 100 MG tablet Take 1 tablet (100  mg total) by mouth 2 (two) times daily. 20 tablet 0   Golimumab (SIMPONI ARIA IV)      lamoTRIgine (LAMICTAL) 100 MG tablet Take one tablet in the morning and take one tablet at lunch. 180 tablet 1   lamoTRIgine (LAMICTAL) 200 MG tablet Take 1 tablet (200 mg total) by mouth at bedtime. 90 tablet 3   levothyroxine (SYNTHROID) 100 MCG tablet Take 1 tablet (100 mcg total) by mouth daily at 6 (six) AM. 90  tablet 3   LORazepam (ATIVAN) 1 MG tablet TAKE 1 TABLET(1 MG) BY MOUTH TWICE DAILY AS NEEDED 60 tablet 2   metroNIDAZOLE (METROGEL) 0.75 % gel Apply to face 1-2 times daily 45 g 1   Omega-3 Fatty Acids (FISH OIL) 1200 MG CAPS Take 1 capsule by mouth in the morning and at bedtime.     sodium bicarbonate 650 MG tablet Take 650 mg by mouth 2 (two) times daily.     traZODone (DESYREL) 50 MG tablet Take one to two tablets at bedtime. 90 tablet 3   No current facility-administered medications for this visit.    Medication Side Effects: None  Allergies:  Allergies  Allergen Reactions   Aripiprazole Other (See Comments)    Parkinsonism      Lactose Intolerance (Gi) Diarrhea   Methotrexate Other (See Comments)    Hair loss, severe stomatitis     Cefdinir Diarrhea    Other reaction(s): Diarrhea Yeast infection and fever; negative c diff   Etanercept Other (See Comments)    Headaches     Exemestane Other (See Comments)    Suicidal thoughts with medication     Fluoxetine Other (See Comments)    Parkinsonism   Methylprednisolone Sodium Succ Other (See Comments)    Agitated mania   Epinephrine Palpitations    tachycardia    Nitrofurantoin Nausea And Vomiting and Rash      Other reaction(s): rash, diarrhea Other reaction(s): rash, diarrhea Other reaction(s): rash, diarrhea Other reaction(s): rash, diarrhea Other reaction(s): rash, diarrhea Other reaction(s): rash, diarrhea    Past Medical History:  Diagnosis Date   Bipolar 1 disorder ([place])    Breast cancer ([place])    Chronic diarrhea    loose stools twice a day on average for years   Chronic kidney disease (CKD), stage III (moderate) ([place])    Depression 1987   Family history of breast cancer    Hospitalization or health care facility admission within last 6 months 04/2019   for fall/seizures   Malignant neoplasm of overlapping sites of left breast in female, estrogen receptor positive ([place]) 03/14/2016   Dx in 09/2014,  s/p bilateral mastectomies and ALND, 0/10 LN. 1.4 cm  Grade I invasive lobular, ER and PR +/Her--, Ki 67 <5% Tried anastrozole for one month, but developed suicidal idea   Osteoarthritis, knee 09/24/2019   Xray 09/2019   Parkinson's disease 2012   Personal history of malignant neoplasm of breast    Polyposis of colon    Psoriatic arthritis ([place])    PTSD (post-traumatic stress disorder)    Secondary hyperparathyroidism ([place])    Tardive dyskinesia    Thyroid disease     Past Medical History, Surgical history, Social history, and Family history were reviewed and updated as appropriate.   Please see review of systems for further details on the patient's review from today.   Objective:   Physical Exam:  There were no vitals taken for this visit.  Physical Exam Constitutional:  General: She is not in acute distress. Musculoskeletal:        General: No deformity.  Neurological:     Mental Status: She is alert and oriented to person, place, and time.     Coordination: Coordination normal.  Psychiatric:        Attention and Perception: Attention and perception normal. She does not perceive auditory or visual hallucinations.        Mood and Affect: Mood normal. Mood is not anxious or depressed. Affect is not labile, blunt, angry or inappropriate.        Speech: Speech normal.        Behavior: Behavior normal.        Thought Content: Thought content normal. Thought content is not paranoid or delusional. Thought content does not include homicidal or suicidal ideation. Thought content does not include homicidal or suicidal plan.        Cognition and Memory: Cognition and memory normal.        Judgment: Judgment normal.     Comments: Insight intact     Lab Review:     Component Value Date/Time   NA 142 12/10/2021 1044   NA 142 10/17/2021 0000   K 4.8 12/10/2021 1044   CL 109 12/10/2021 1044   CO2 24 12/10/2021 1044   GLUCOSE 88 12/10/2021 1044   BUN 36 (H) 12/10/2021 1044    BUN 30 (A) 10/17/2021 0000   CREATININE 2.45 (H) 12/10/2021 1044   CREATININE 2.29 (H) 05/22/2021 1044   CALCIUM 9.5 12/10/2021 1044   PROT 7.0 12/10/2021 1044   ALBUMIN 4.4 12/10/2021 1044   AST 14 12/10/2021 1044   ALT 12 12/10/2021 1044   ALKPHOS 122 (H) 12/10/2021 1044   BILITOT 0.7 12/10/2021 1044   GFRNONAA 20 (L) 10/29/2021 1055   GFRAA 25 (L) 09/13/2019 1243       Component Value Date/Time   WBC 9.5 10/29/2021 1055   RBC 4.64 10/29/2021 1055   HGB 14.1 10/29/2021 1055   HCT 44.1 10/29/2021 1055   PLT 214 10/29/2021 1055   MCV 95.0 10/29/2021 1055   MCH 30.4 10/29/2021 1055   MCHC 32.0 10/29/2021 1055   RDW 13.2 10/29/2021 1055   LYMPHSABS 3.2 07/02/2021 1048   MONOABS 0.7 07/02/2021 1048   EOSABS 0.2 07/02/2021 1048   BASOSABS 0.1 07/02/2021 1048    Lithium Lvl  Date Value Ref Range Status  05/22/2021 0.7 0.6 - 1.2 mmol/L Final     No results found for: "PHENYTOIN", "PHENOBARB", "VALPROATE", "CBMZ"   .res Assessment: Plan:    Plan:  Therapist - Christina Hussami - [facility]  Trazadone '50mg'$  - 1 tablet at bedtime Lorazepam '1mg'$  BID for anxiety - may take one tablet extra for severe anxiety symptoms. Taking one at night routinely. Lamictal '200mg'$  hs Lamictal '150mg'$  every morning.  Reports a correlation of increased GFR with Lamictal  Consider low dose of Gabapentin.  NAC tablets BID - for obsessive thoughts, worry, and rumination - unable to take SSRI's  RTC weekly  Seeing Endocrinologist on 10/23.  Counseled patient regarding potential benefits, risks, and side effects of Lamictal to include potential risk of Stevens-Johnson syndrome. Advised patient to stop taking Lamictal and contact office immediately if rash develops and to seek urgent medical attention if rash is severe and/or spreading quickly.  Discussed potential benefits, risk, and side effects of benzodiazepines to include potential risk of tolerance and dependence, as well as possible  drowsiness. Advised patient not to drive if  experiencing drowsiness and to take lowest possible effective dose to minimize risk of dependence and tolerance.   Diagnoses and all orders for this visit:  Bipolar I disorder ([place])  PTSD (post-traumatic stress disorder)  Generalized anxiety disorder  Insomnia due to other mental disorder     Please see After Visit Summary for patient specific instructions.  Future Appointments  Date Time Provider [place]  02/05/2022 11:20 AM Tynisa Vohs, Berdie Ogren, NP CP-CP None  03/06/2022 11:45 AM [facility] LAB [facility] None  04/15/2022  1:45 PM [facility] HEALTH COACH [facility] PEC  05/08/2022 12:10 PM Shamleffer, Melanie Crazier, MD [facility] None    No orders of the defined types were placed in this encounter.   -------------------------------

## 2022-02-04 ENCOUNTER — Encounter: Payer: Self-pay | Admitting: Physician Assistant

## 2022-02-04 NOTE — Telephone Encounter (Signed)
Please see message and advise 

## 2022-02-05 ENCOUNTER — Ambulatory Visit (INDEPENDENT_AMBULATORY_CARE_PROVIDER_SITE_OTHER): Payer: Medicare Other | Admitting: Adult Health

## 2022-02-05 ENCOUNTER — Encounter: Payer: Self-pay | Admitting: Adult Health

## 2022-02-05 DIAGNOSIS — F4310 Post-traumatic stress disorder, unspecified: Secondary | ICD-10-CM

## 2022-02-05 DIAGNOSIS — F319 Bipolar disorder, unspecified: Secondary | ICD-10-CM | POA: Diagnosis not present

## 2022-02-05 DIAGNOSIS — F411 Generalized anxiety disorder: Secondary | ICD-10-CM | POA: Diagnosis not present

## 2022-02-05 DIAGNOSIS — F99 Mental disorder, not otherwise specified: Secondary | ICD-10-CM

## 2022-02-05 DIAGNOSIS — F5105 Insomnia due to other mental disorder: Secondary | ICD-10-CM

## 2022-02-05 MED ORDER — LORAZEPAM 1 MG PO TABS: ORAL_TABLET | ORAL | 2 refills | Status: DC

## 2022-02-05 MED ORDER — LAMOTRIGINE 200 MG PO TABS: 200.0000 mg | ORAL_TABLET | Freq: Every day | ORAL | 3 refills | Status: DC

## 2022-02-05 NOTE — Progress Notes (Signed)
Tiffany [name] [id number] [birth year] 71 y.o.  Subjective:   Patient ID:  Tiffany [name] is a 71 y.o. (DOB [birth year]) female.  Chief Complaint: No chief complaint on file.   HPI Tiffany [name] presents to the office today for follow-up of PTSD, insomnia, GAD and  BPD 1.  Accompanied by husband.  Describes mood today as "ok". Pleasant.Tearful. Mood symptoms - reports increased anxiety. Denies depression. Reports irritability. Reports worry, rumination and overthinking. Reports mood as variable - denies cycling. Reports feeling "triggered" a lot lately - working with therapist - Tiffany [name]. Stating "I'm doing ok". Plans to see PCP regarding constipation issues. Feels like medications are helpful. Supportive family. Varying interest and motivation. Taking medications as prescribed. Energy levels lower - feels "weak". Active, plans to start working with a Physiological scientist. Enjoys some usual interests and activities. Married. Lives with husband their daughter. Talking to family and friends.  Appetite improved. Weight loss. Sleep is consistent. Averages 9 to 10 hours with Trazadone '50mg'$  daily. Focus and concentration variable. Completing tasks. Managing some aspects of household. Retired.  Denies SI or HI.  Denies AH or VH  Denies self harm. Denies substance use.     [place] Office Visit from 02/16/2021 in [place] Visit from 05/14/2019 in [place]  Total GAD-7 Score 13 [place] Office Visit from 06/07/2019 in [place]  Total Score (max 30 points ) 29      PHQ2-9    [place] from 11/30/2021 in [place] Coordination Nutrition from 11/06/2021 in [facility] Office Visit from 07/30/2021 in [place] Visit from 07/02/2021 in [place] from 04/02/2021 in [place]  PHQ-2 Total Score 0 0 '2 1 1  '$ PHQ-9 Total Score -- -- '11 6 1      '$ Flowsheet Row ED from 11/26/2021 in [facility] Clinical Support from 04/02/2021 in [facility] from 03/21/2021 in [facility]  C-SSRS RISK CATEGORY No Risk No Risk No Risk        Review of Systems:  Review of Systems  Musculoskeletal:  Negative for gait problem.  Neurological:  Negative for tremors.  Psychiatric/Behavioral:         Please refer to HPI    Medications: I have reviewed the patient's current medications.  Current Outpatient Medications  Medication Sig Dispense Refill   Acetylcysteine (N-ACETYL CYSTEINE) 600 MG CAPS 1 capsule Orally Twice a day     atorvastatin (LIPITOR) 10 MG tablet Take 1 tablet (10 mg total) by mouth daily. 90 tablet 1   Azelaic Acid 15 % gel Apply topically as needed.     B Complex Vitamins (VITAMIN B COMPLEX) TABS 1 tablet Orally Once daily     benzonatate (TESSALON) 100 MG capsule as needed.     betamethasone dipropionate 6.25 % cream 1 application Externally Twice a day     Cholecalciferol 50 MCG (2000 UT) TABS 1 tablet Orally Once a day for 30 day(s)     doxycycline (VIBRA-TABS) 100 MG tablet Take 1 tablet (100 mg total) by mouth 2 (two) times daily. 20 tablet 0   Golimumab (SIMPONI ARIA IV)      lamoTRIgine (LAMICTAL) 100 MG tablet Take one tablet in the morning and take one  tablet at lunch. 180 tablet 1   lamoTRIgine (LAMICTAL) 200 MG tablet Take 1 tablet (200 mg total) by mouth at bedtime. 90 tablet 3   levothyroxine (SYNTHROID) 100 MCG tablet Take 1 tablet (100 mcg total) by mouth daily at 6 (six) AM. 90 tablet 3   LORazepam (ATIVAN) 1 MG tablet TAKE 1 TABLET(1 MG) BY MOUTH TWICE DAILY AS NEEDED 60 tablet 2   metroNIDAZOLE (METROGEL) 0.75 % gel Apply to face 1-2 times daily 45 g 1   Omega-3 Fatty Acids (FISH OIL) 1200  MG CAPS Take 1 capsule by mouth in the morning and at bedtime.     sodium bicarbonate 650 MG tablet Take 650 mg by mouth 2 (two) times daily.     traZODone (DESYREL) 50 MG tablet Take one to two tablets at bedtime. 90 tablet 3   No current facility-administered medications for this visit.    Medication Side Effects: None  Allergies:  Allergies  Allergen Reactions   Aripiprazole Other (See Comments)    Parkinsonism      Lactose Intolerance (Gi) Diarrhea   Methotrexate Other (See Comments)    Hair loss, severe stomatitis     Cefdinir Diarrhea    Other reaction(s): Diarrhea Yeast infection and fever; negative c diff   Etanercept Other (See Comments)    Headaches     Exemestane Other (See Comments)    Suicidal thoughts with medication     Fluoxetine Other (See Comments)    Parkinsonism   Methylprednisolone Sodium Succ Other (See Comments)    Agitated mania   Epinephrine Palpitations    tachycardia    Nitrofurantoin Nausea And Vomiting and Rash      Other reaction(s): rash, diarrhea Other reaction(s): rash, diarrhea Other reaction(s): rash, diarrhea Other reaction(s): rash, diarrhea Other reaction(s): rash, diarrhea Other reaction(s): rash, diarrhea    Past Medical History:  Diagnosis Date   Bipolar 1 disorder ([place])    Breast cancer ([place])    Chronic diarrhea    loose stools twice a day on average for years   Chronic kidney disease (CKD), stage III (moderate) ([place])    Depression 1987   Family history of breast cancer    Hospitalization or health care facility admission within last 6 months 04/2019   for fall/seizures   Malignant neoplasm of overlapping sites of left breast in female, estrogen receptor positive ([place]) 03/14/2016   Dx in 09/2014, s/p bilateral mastectomies and ALND, 0/10 LN. 1.4 cm  Grade I invasive lobular, ER and PR +/Her--, Ki 67 <5% Tried anastrozole for one month, but developed suicidal idea   Osteoarthritis, knee 09/24/2019   Xray 09/2019    Parkinson's disease 2012   Personal history of malignant neoplasm of breast    Polyposis of colon    Psoriatic arthritis ([place])    PTSD (post-traumatic stress disorder)    Secondary hyperparathyroidism ([place])    Tardive dyskinesia    Thyroid disease     Past Medical History, Surgical history, Social history, and Family history were reviewed and updated as appropriate.   Please see review of systems for further details on the patient's review from today.   Objective:   Physical Exam:  There were no vitals taken for this visit.  Physical Exam Constitutional:      General: She is not in acute distress. Musculoskeletal:        General: No deformity.  Neurological:     Mental Status: She is alert and oriented to person, place, and time.  Coordination: Coordination normal.  Psychiatric:        Attention and Perception: Attention and perception normal. She does not perceive auditory or visual hallucinations.        Mood and Affect: Mood normal. Mood is not anxious or depressed. Affect is not labile, blunt, angry or inappropriate.        Speech: Speech normal.        Behavior: Behavior normal.        Thought Content: Thought content normal. Thought content is not paranoid or delusional. Thought content does not include homicidal or suicidal ideation. Thought content does not include homicidal or suicidal plan.        Cognition and Memory: Cognition and memory normal.        Judgment: Judgment normal.     Comments: Insight intact     Lab Review:     Component Value Date/Time   NA 142 12/10/2021 1044   NA 142 10/17/2021 0000   K 4.8 12/10/2021 1044   CL 109 12/10/2021 1044   CO2 24 12/10/2021 1044   GLUCOSE 88 12/10/2021 1044   BUN 36 (H) 12/10/2021 1044   BUN 30 (A) 10/17/2021 0000   CREATININE 2.45 (H) 12/10/2021 1044   CREATININE 2.29 (H) 05/22/2021 1044   CALCIUM 9.5 12/10/2021 1044   PROT 7.0 12/10/2021 1044   ALBUMIN 4.4 12/10/2021 1044   AST 14 12/10/2021 1044    ALT 12 12/10/2021 1044   ALKPHOS 122 (H) 12/10/2021 1044   BILITOT 0.7 12/10/2021 1044   GFRNONAA 20 (L) 10/29/2021 1055   GFRAA 25 (L) 09/13/2019 1243       Component Value Date/Time   WBC 9.5 10/29/2021 1055   RBC 4.64 10/29/2021 1055   HGB 14.1 10/29/2021 1055   HCT 44.1 10/29/2021 1055   PLT 214 10/29/2021 1055   MCV 95.0 10/29/2021 1055   MCH 30.4 10/29/2021 1055   MCHC 32.0 10/29/2021 1055   RDW 13.2 10/29/2021 1055   LYMPHSABS 3.2 07/02/2021 1048   MONOABS 0.7 07/02/2021 1048   EOSABS 0.2 07/02/2021 1048   BASOSABS 0.1 07/02/2021 1048    Lithium Lvl  Date Value Ref Range Status  05/22/2021 0.7 0.6 - 1.2 mmol/L Final     No results found for: "PHENYTOIN", "PHENOBARB", "VALPROATE", "CBMZ"   .res Assessment: Plan:    Plan  Trazadone '50mg'$  - 1 tablet at bedtime Lorazepam '1mg'$  BID for anxiety - may take one tablet extra for severe anxiety symptoms. Taking one at night routinely. Lamictal '200mg'$  hs Lamictal '150mg'$  every morning.  Reports a correlation of increased GFR with Lamictal  Consider low dose of Gabapentin.  NAC tablets BID - for obsessive thoughts, worry, and rumination - unable to take SSRI's  RTC weekly  Seeing Endocrinologist on 10/23.  Counseled patient regarding potential benefits, risks, and side effects of Lamictal to include potential risk of Stevens-Johnson syndrome. Advised patient to stop taking Lamictal and contact office immediately if rash develops and to seek urgent medical attention if rash is severe and/or spreading quickly.  Discussed potential benefits, risk, and side effects of benzodiazepines to include potential risk of tolerance and dependence, as well as possible drowsiness. Advised patient not to drive if experiencing drowsiness and to take lowest possible effective dose to minimize risk of dependence and tolerance.   Diagnoses and all orders for this visit:  Bipolar I disorder ([place])  PTSD (post-traumatic stress  disorder)  Generalized anxiety disorder  Insomnia due to other mental disorder  Please see After Visit Summary for patient specific instructions.  Future Appointments  Date Time Provider [place]  02/05/2022 11:20 AM Tiffany [name], Tiffany Ogren, Tiffany [name] CP-CP None  02/06/2022  3:40 PM Tiffany [name], Tiffany [name] [facility] PEC  03/06/2022 11:45 AM [facility] LAB [facility] None  04/15/2022  1:45 PM [facility] HEALTH COACH [facility] PEC  05/08/2022 12:10 PM Shamleffer, Melanie Crazier, MD [facility] None    No orders of the defined types were placed in this encounter.   -------------------------------

## 2022-02-05 NOTE — Telephone Encounter (Signed)
Patient states: - She was calling to make sure that messages below have been understood between her and Butch Penny.  - Constipation has been present x 10 days  - Within 10 days she has been taking colace and eating prunes.  - She can't take miralax since she has stage 4 CKD; Believes no one has seen this in her history since mira lax has been suggested to her multiple times   Due to this I offered pt an OV with PCP. Pt has been scheduled 11/29 @ 3:40pm w/ Inda Coke.

## 2022-02-05 NOTE — Telephone Encounter (Signed)
Noted, pt scheduled for tomorrow with Aldona Bar.

## 2022-02-06 ENCOUNTER — Ambulatory Visit (INDEPENDENT_AMBULATORY_CARE_PROVIDER_SITE_OTHER): Payer: Medicare Other | Admitting: Physician Assistant

## 2022-02-06 ENCOUNTER — Encounter: Payer: Self-pay | Admitting: Physician Assistant

## 2022-02-06 VITALS — HR 62 | Temp 97.7°F | Ht 64.0 in | Wt 196.4 lb

## 2022-02-06 DIAGNOSIS — K5900 Constipation, unspecified: Secondary | ICD-10-CM | POA: Diagnosis not present

## 2022-02-06 NOTE — Progress Notes (Signed)
Tiffany [name] is a 71 y.o. female here for a follow up of a pre-existing problem.  History of Present Illness:   Chief Complaint  Patient presents with   Constipation    Pt says she moves her bowels everyday, but about the size of a dime. Pt is c/o nausea.. She was taking 3 Colace a day for about 5 days, none yesterday & today, prunes.    HPI  Constipation Patient is complaining of constipation for 3 weeks. She states that she does move her bowels, but they are the size of a dime and has passed minimal gas. She does not have any inclinations or suspicions on why this is occurring and reports no significant changes other than stopping lithium recently. She also experiences random waves of nausea. Patient expresses that she has decreased activity because she feels weak. She manages her symptoms with prunes, extra fluid, and Colace.  She denies experiencing any abdomen pain, blood in stool, and urinary issues.  Past Medical History:  Diagnosis Date   Bipolar 1 disorder ([place])    Breast cancer ([place])    Chronic diarrhea    loose stools twice a day on average for years   Chronic kidney disease (CKD), stage III (moderate) ([place])    Depression 1987   Family history of breast cancer    Hospitalization or health care facility admission within last 6 months 04/2019   for fall/seizures   Malignant neoplasm of overlapping sites of left breast in female, estrogen receptor positive ([place]) 03/14/2016   Dx in 09/2014, s/p bilateral mastectomies and ALND, 0/10 LN. 1.4 cm  Grade I invasive lobular, ER and PR +/Her--, Ki 67 <5% Tried anastrozole for one month, but developed suicidal idea   Osteoarthritis, knee 09/24/2019   Xray 09/2019   Parkinson's disease 2012   Personal history of malignant neoplasm of breast    Polyposis of colon    Psoriatic arthritis ([place])    PTSD (post-traumatic stress disorder)    Secondary hyperparathyroidism ([place])    Tardive dyskinesia    Thyroid disease      Social History    Tobacco Use   Smoking status: Never   Smokeless tobacco: Never  Vaping Use   Vaping Use: Never used  Substance Use Topics   Alcohol use: Never   Drug use: Never    Past Surgical History:  Procedure Laterality Date   ABDOMINAL HYSTERECTOMY  1987   CHOLECYSTECTOMY  1979   DILATION AND CURETTAGE OF UTERUS  1973   MASTECTOMY Bilateral 09/19/2014   TONSILLECTOMY  1970   URETERAL REIMPLANTION Bilateral 1974    Family History  Problem Relation Age of Onset   Lymphoma Mother 31   Lymphoma Sister 19   Breast cancer Sister 45   Colon polyps Sister    Lung cancer Sister        former smoker   Stroke Maternal Grandfather    Diabetes Paternal Grandfather    Colon cancer Neg Hx    Esophageal cancer Neg Hx    Stomach cancer Neg Hx    Rectal cancer Neg Hx     Allergies  Allergen Reactions   Aripiprazole Other (See Comments)    Parkinsonism      Lactose Intolerance (Gi) Diarrhea   Methotrexate Other (See Comments)    Hair loss, severe stomatitis     Cefdinir Diarrhea    Other reaction(s): Diarrhea Yeast infection and fever; negative c diff   Etanercept Other (See Comments)  Headaches     Exemestane Other (See Comments)    Suicidal thoughts with medication     Fluoxetine Other (See Comments)    Parkinsonism   Methylprednisolone Sodium Succ Other (See Comments)    Agitated mania   Epinephrine Palpitations    tachycardia    Nitrofurantoin Nausea And Vomiting and Rash      Other reaction(s): rash, diarrhea Other reaction(s): rash, diarrhea Other reaction(s): rash, diarrhea Other reaction(s): rash, diarrhea Other reaction(s): rash, diarrhea Other reaction(s): rash, diarrhea    Current Medications:   Current Outpatient Medications:    Acetylcysteine (N-ACETYL CYSTEINE) 600 MG CAPS, 1 capsule Orally Twice a day, Disp: , Rfl:    atorvastatin (LIPITOR) 10 MG tablet, Take 1 tablet (10 mg total) by mouth daily., Disp: 90 tablet, Rfl: 1   Azelaic Acid 15 %  gel, Apply topically as needed., Disp: , Rfl:    B Complex Vitamins (VITAMIN B COMPLEX) TABS, 1 tablet Orally Once daily, Disp: , Rfl:    betamethasone dipropionate 3.61 % cream, 1 application Externally Twice a day, Disp: , Rfl:    Cholecalciferol 50 MCG (2000 UT) TABS, 1 tablet Orally Once a day for 30 day(s), Disp: , Rfl:    Golimumab (SIMPONI ARIA IV), Infusion every 8 weeks, Disp: , Rfl:    lamoTRIgine (LAMICTAL) 100 MG tablet, Take one tablet in the morning and take one tablet at lunch. (Patient taking differently: Take 100 mg by mouth daily.), Disp: 180 tablet, Rfl: 1   lamoTRIgine (LAMICTAL) 200 MG tablet, Take 1 tablet (200 mg total) by mouth at bedtime., Disp: 90 tablet, Rfl: 3   levothyroxine (SYNTHROID) 100 MCG tablet, Take 1 tablet (100 mcg total) by mouth daily at 6 (six) AM., Disp: 90 tablet, Rfl: 3   LORazepam (ATIVAN) 1 MG tablet, TAKE 1 TABLET(1 MG) BY MOUTH TWICE DAILY AS NEEDED, Disp: 60 tablet, Rfl: 2   metroNIDAZOLE (METROGEL) 0.75 % gel, Apply to face 1-2 times daily, Disp: 45 g, Rfl: 1   Omega-3 Fatty Acids (FISH OIL) 1200 MG CAPS, Take 1 capsule by mouth in the morning and at bedtime., Disp: , Rfl:    sodium bicarbonate 650 MG tablet, Take 650 mg by mouth 2 (two) times daily., Disp: , Rfl:    traZODone (DESYREL) 50 MG tablet, Take one to two tablets at bedtime., Disp: 90 tablet, Rfl: 3   Review of Systems:   Review of Systems  Gastrointestinal:  Positive for constipation. Negative for abdominal pain and blood in stool.    Vitals:   Vitals:   02/06/22 1603  Pulse: 62  Temp: 97.7 F (36.5 C)  TempSrc: Temporal  SpO2: 98%  Weight: 196 lb 6.1 oz (89.1 kg)  Height: '5\' 4"'$  (1.626 m)     Body mass index is 33.71 kg/m.  Physical Exam:   Physical Exam Constitutional:      General: She is not in acute distress.    Appearance: Normal appearance. She is not ill-appearing.  HENT:     Head: Normocephalic and atraumatic.     Right Ear: External ear normal.      Left Ear: External ear normal.  Eyes:     Extraocular Movements: Extraocular movements intact.     Pupils: Pupils are equal, round, and reactive to light.  Cardiovascular:     Rate and Rhythm: Normal rate and regular rhythm.     Heart sounds: Normal heart sounds. No murmur heard.    No gallop.  Pulmonary:  Effort: Pulmonary effort is normal. No respiratory distress.     Breath sounds: Normal breath sounds. No wheezing or rales.  Abdominal:     General: Abdomen is flat. Bowel sounds are normal.     Palpations: Abdomen is soft.     Tenderness: There is no abdominal tenderness.  Skin:    General: Skin is warm and dry.  Neurological:     Mental Status: She is alert and oriented to person, place, and time.  Psychiatric:        Judgment: Judgment normal.     Assessment and Plan:   Constipation, unspecified constipation type No red flags Recommend starting miralax 1 capful daily and 200 mg colace If no BM in  3 days, increase miralax to 2 capfuls Call if no relief Handout provided Push fluids, fiber and movement  I,Verona Buck,acting as a scribe for [employer], PA.,have documented all relevant documentation on the behalf of Inda Coke, PA,as directed by  Inda Coke, PA while in the presence of Inda Coke, [place].  I, Inda Coke, [place], have reviewed all documentation for this visit. The documentation on 02/06/22 for the exam, diagnosis, procedures, and orders are all accurate and complete.   Inda Coke, PA-C

## 2022-02-07 ENCOUNTER — Encounter: Payer: Self-pay | Admitting: Physician Assistant

## 2022-02-13 ENCOUNTER — Ambulatory Visit (HOSPITAL_COMMUNITY): Payer: Medicare Other | Admitting: Licensed Clinical Social Worker

## 2022-02-13 ENCOUNTER — Ambulatory Visit (INDEPENDENT_AMBULATORY_CARE_PROVIDER_SITE_OTHER): Payer: Medicare Other | Admitting: Adult Health

## 2022-02-13 ENCOUNTER — Encounter: Payer: Self-pay | Admitting: Adult Health

## 2022-02-13 DIAGNOSIS — F5105 Insomnia due to other mental disorder: Secondary | ICD-10-CM | POA: Diagnosis not present

## 2022-02-13 DIAGNOSIS — F4310 Post-traumatic stress disorder, unspecified: Secondary | ICD-10-CM | POA: Diagnosis not present

## 2022-02-13 DIAGNOSIS — F411 Generalized anxiety disorder: Secondary | ICD-10-CM | POA: Diagnosis not present

## 2022-02-13 DIAGNOSIS — F99 Mental disorder, not otherwise specified: Secondary | ICD-10-CM

## 2022-02-13 DIAGNOSIS — F319 Bipolar disorder, unspecified: Secondary | ICD-10-CM | POA: Diagnosis not present

## 2022-02-13 NOTE — Progress Notes (Signed)
Tiffany [name] [id number] 05/18/1950 71 y.o.  Subjective:   Patient ID:  Tiffany [name] is a 71 y.o. (DOB 11/08/1950) female.  Chief Complaint: No chief complaint on file.   HPI Tiffany [name] presents to the office today for follow-up of  PTSD, insomnia, GAD and  BPD 1.  Accompanied by husband.  Describes mood today as "ok". Pleasant. Denies tearfulness. Mood symptoms - reports anxiety. Denies depression. Reports irritability - "at times". Reports worry, rumination, and overthinking. Reports mood as variable - reports rapid cycling. Stating "I'm doing alright for now". Feels like medications are helpful. Family supportive. Varying interest and motivation. Taking medications as prescribed. Working with therapist - Olive Bass. Energy levels variable - up and down. Active, working with a Physiological scientist - learning how to use equipment. Enjoys some usual interests and activities. Married. Lives with husband their daughter. Talking to family and friends.  Appetite improved. Weight loss. Sleep is consistent. Averages 6 hours with Trazadone '50mg'$  daily. Focus and concentration variable. Completing tasks. Managing some aspects of household. Retired.  Denies SI or HI.  Denies AH or VH  Denies self harm. Denies substance use.    [facility] Office Visit from 02/16/2021 in [facility] Visit from 05/14/2019 in [facility]  Total GAD-7 Score 13 [facility] Office Visit from 06/07/2019 in [facility]  Total Score (max 30 points ) 29      PHQ2-9    [place] from 11/30/2021 in [place] [facility] from 11/06/2021 [facility] Visit from 07/30/2021 in [facility] Visit from 07/02/2021 in [facility] from 04/02/2021 in  [facility]  PHQ-2 Total Score 0 0 '2 1 1  '$ PHQ-9 Total Score -- -- '11 6 1      '$ Flowsheet Row ED from 11/26/2021 in [facility] Clinical Support from 04/02/2021 in [facility] from 03/21/2021 in [facility]  C-SSRS RISK CATEGORY No Risk No Risk No Risk        Review of Systems:  Review of Systems  Musculoskeletal:  Negative for gait problem.  Neurological:  Negative for tremors.  Psychiatric/Behavioral:         Please refer to HPI    Medications: I have reviewed the patient's current medications.  Current Outpatient Medications  Medication Sig Dispense Refill   Acetylcysteine (N-ACETYL CYSTEINE) 600 MG CAPS 1 capsule Orally Twice a day     atorvastatin (LIPITOR) 10 MG tablet Take 1 tablet (10 mg total) by mouth daily. 90 tablet 1   Azelaic Acid 15 % gel Apply topically as needed.     B Complex Vitamins (VITAMIN B COMPLEX) TABS 1 tablet Orally Once daily     betamethasone dipropionate 4.09 % cream 1 application Externally Twice a day     Cholecalciferol 50 MCG (2000 UT) TABS 1 tablet Orally Once a day for 30 day(s)     Golimumab (SIMPONI ARIA IV) Infusion every 8 weeks     lamoTRIgine (LAMICTAL) 100 MG tablet Take one tablet in the morning and take one tablet at lunch. (Patient taking differently: Take 100 mg by mouth daily.) 180 tablet 1   lamoTRIgine (LAMICTAL) 200 MG tablet Take 1 tablet (200 mg total) by mouth at bedtime. 90 tablet 3  levothyroxine (SYNTHROID) 100 MCG tablet Take 1 tablet (100 mcg total) by mouth daily at 6 (six) AM. 90 tablet 3   LORazepam (ATIVAN) 1 MG tablet TAKE 1 TABLET(1 MG) BY MOUTH TWICE DAILY AS NEEDED 60 tablet 2   metroNIDAZOLE (METROGEL) 0.75 % gel Apply to face 1-2 times daily 45 g 1   Omega-3 Fatty Acids (FISH OIL) 1200 MG CAPS Take 1 capsule by mouth in the morning and at bedtime.     sodium bicarbonate 650 MG tablet Take 650 mg by mouth 2  (two) times daily.     traZODone (DESYREL) 50 MG tablet Take one to two tablets at bedtime. 90 tablet 3   No current facility-administered medications for this visit.    Medication Side Effects: None  Allergies:  Allergies  Allergen Reactions   Aripiprazole Other (See Comments)    Parkinsonism      Lactose Intolerance (Gi) Diarrhea   Methotrexate Other (See Comments)    Hair loss, severe stomatitis     Cefdinir Diarrhea    Other reaction(s): Diarrhea Yeast infection and fever; negative c diff   Etanercept Other (See Comments)    Headaches     Exemestane Other (See Comments)    Suicidal thoughts with medication     Fluoxetine Other (See Comments)    Parkinsonism   Methylprednisolone Sodium Succ Other (See Comments)    Agitated mania   Epinephrine Palpitations    tachycardia    Nitrofurantoin Nausea And Vomiting and Rash      Other reaction(s): rash, diarrhea Other reaction(s): rash, diarrhea Other reaction(s): rash, diarrhea Other reaction(s): rash, diarrhea Other reaction(s): rash, diarrhea Other reaction(s): rash, diarrhea    Past Medical History:  Diagnosis Date   Bipolar 1 disorder ([place])    Breast cancer ([place])    Chronic diarrhea    loose stools twice a day on average for years   Chronic kidney disease (CKD), stage III (moderate) ([place])    Depression 1987   Family history of breast cancer    Hospitalization or health care facility admission within last 6 months 04/2019   for fall/seizures   Malignant neoplasm of overlapping sites of left breast in female, estrogen receptor positive ([place]) 03/14/2016   Dx in 09/2014, s/p bilateral mastectomies and ALND, 0/10 LN. 1.4 cm  Grade I invasive lobular, ER and PR +/Her--, Ki 67 <5% Tried anastrozole for one month, but developed suicidal idea   Osteoarthritis, knee 09/24/2019   Xray 09/2019   Parkinson's disease 2012   Personal history of malignant neoplasm of breast    Polyposis of colon    Psoriatic arthritis  ([place])    PTSD (post-traumatic stress disorder)    Secondary hyperparathyroidism ([place])    Tardive dyskinesia    Thyroid disease     Past Medical History, Surgical history, Social history, and Family history were reviewed and updated as appropriate.   Please see review of systems for further details on the patient's review from today.   Objective:   Physical Exam:  There were no vitals taken for this visit.  Physical Exam Constitutional:      General: She is not in acute distress. Musculoskeletal:        General: No deformity.  Neurological:     Mental Status: She is alert and oriented to person, place, and time.     Coordination: Coordination normal.  Psychiatric:        Attention and Perception: Attention and perception normal. She does not perceive auditory  or visual hallucinations.        Mood and Affect: Mood normal. Mood is not anxious or depressed. Affect is not labile, blunt, angry or inappropriate.        Speech: Speech normal.        Behavior: Behavior normal.        Thought Content: Thought content normal. Thought content is not paranoid or delusional. Thought content does not include homicidal or suicidal ideation. Thought content does not include homicidal or suicidal plan.        Cognition and Memory: Cognition and memory normal.        Judgment: Judgment normal.     Comments: Insight intact     Lab Review:     Component Value Date/Time   NA 142 12/10/2021 1044   NA 142 10/17/2021 0000   K 4.8 12/10/2021 1044   CL 109 12/10/2021 1044   CO2 24 12/10/2021 1044   GLUCOSE 88 12/10/2021 1044   BUN 36 (H) 12/10/2021 1044   BUN 30 (A) 10/17/2021 0000   CREATININE 2.45 (H) 12/10/2021 1044   CREATININE 2.29 (H) 05/22/2021 1044   CALCIUM 9.5 12/10/2021 1044   PROT 7.0 12/10/2021 1044   ALBUMIN 4.4 12/10/2021 1044   AST 14 12/10/2021 1044   ALT 12 12/10/2021 1044   ALKPHOS 122 (H) 12/10/2021 1044   BILITOT 0.7 12/10/2021 1044   GFRNONAA 20 (L) 10/29/2021 1055    GFRAA 25 (L) 09/13/2019 1243       Component Value Date/Time   WBC 9.5 10/29/2021 1055   RBC 4.64 10/29/2021 1055   HGB 14.1 10/29/2021 1055   HCT 44.1 10/29/2021 1055   PLT 214 10/29/2021 1055   MCV 95.0 10/29/2021 1055   MCH 30.4 10/29/2021 1055   MCHC 32.0 10/29/2021 1055   RDW 13.2 10/29/2021 1055   LYMPHSABS 3.2 07/02/2021 1048   MONOABS 0.7 07/02/2021 1048   EOSABS 0.2 07/02/2021 1048   BASOSABS 0.1 07/02/2021 1048    L[place] Lvl  Date Value Ref Range Status  05/22/2021 0.7 0.6 - 1.2 mmol/L Final     No results found for: "PHENYTOIN", "PHENOBARB", "VALPROATE", "CBMZ"   .res Assessment: Plan:    Plan  Trazadone '50mg'$  - 1 tablet at bedtime Lorazepam '1mg'$  BID for anxiety - may take one tablet extra for severe anxiety symptoms. Taking one at night routinely. Lamictal '200mg'$  hs Lamictal '150mg'$  every morning.  Reports a correlation of increased GFR with Lamictal  Consider low dose of Gabapentin.  NAC tablets BID - for obsessive thoughts, worry, and rumination - unable to take SSRI's  RTC weekly  Seeing Endocrinologist on 10/23.  Counseled patient regarding potential benefits, risks, and side effects of Lamictal to include potential risk of Stevens-Johnson syndrome. Advised patient to stop taking Lamictal and contact office immediately if rash develops and to seek urgent medical attention if rash is severe and/or spreading quickly.  Discussed potential benefits, risk, and side effects of benzodiazepines to include potential risk of tolerance and dependence, as well as possible drowsiness. Advised patient not to drive if experiencing drowsiness and to take lowest possible effective dose to minimize risk of dependence and tolerance.  Diagnoses and all orders for this visit:  Bipolar I disorder ([place])  Generalized anxiety disorder  PTSD (post-traumatic stress disorder)  Insomnia due to other mental disorder     Please see After Visit Summary for patient  specific instructions.  Future Appointments  Date Time Provider [place]  02/27/2022 12:00 PM Izora Benn,  Berdie Ogren, NP [facility] None  03/06/2022 11:45 AM [facility]NE[facility] None  04/15/2022  1:45 PM [facility] HEALTH COACH [facility]  05/08/2022 12:10 PM Shamleffer, Melanie Crazier, MD [facility] None    No orders of the defined types were placed in this encounter.   -------------------------------

## 2022-02-20 ENCOUNTER — Ambulatory Visit: Payer: Medicare Other | Admitting: Adult Health

## 2022-02-27 ENCOUNTER — Encounter: Payer: Self-pay | Admitting: Adult Health

## 2022-02-27 ENCOUNTER — Ambulatory Visit (INDEPENDENT_AMBULATORY_CARE_PROVIDER_SITE_OTHER): Payer: Medicare Other | Admitting: Adult Health

## 2022-02-27 DIAGNOSIS — F5105 Insomnia due to other mental disorder: Secondary | ICD-10-CM

## 2022-02-27 DIAGNOSIS — F319 Bipolar disorder, unspecified: Secondary | ICD-10-CM

## 2022-02-27 DIAGNOSIS — F4310 Post-traumatic stress disorder, unspecified: Secondary | ICD-10-CM | POA: Diagnosis not present

## 2022-02-27 DIAGNOSIS — F411 Generalized anxiety disorder: Secondary | ICD-10-CM | POA: Diagnosis not present

## 2022-02-27 DIAGNOSIS — F99 Mental disorder, not otherwise specified: Secondary | ICD-10-CM

## 2022-02-27 NOTE — Progress Notes (Signed)
Tiffany [name] [id number] 25-Jun-1950 71 y.o.  Subjective:   Patient ID:  Tiffany [name] is a 71 y.o. (DOB 05-27-1950) female.  Chief Complaint: No chief complaint on file.   HPI Tiffany [name] presents to the office today for follow-up of PTSD, insomnia, GAD and  BPD 1.  Accompanied by husband.  Describes mood today as "ok". Pleasant. Denies tearfulness. Mood symptoms - reports anxiety and depression - "off and on". Reports irritability - "at times". Reports worry, rumination, and overthinking. Reports mood as variable - "cycling in and out of mania". Reports some situational stresors and triggers. Stating "I'm doing ok today". Feels like medications are helpful. Family supportive. Varying interest and motivation. Taking medications as prescribed. Working with therapist - [facility]. Energy levels are variable. Active, does not have a regular exercise routine. Enjoys some usual interests and activities. Married. Lives with husband their daughter. Talking to family and friends.  Appetite improved. Weight loss. Sleep is consistent. Averages 6 hours daily. Focus and concentration variable. Completing tasks. Managing some aspects of household. Retired.  Denies SI or HI.  Denies AH or VH  Denies self harm. Denies substance use.   [place] Office Visit from 02/16/2021 in [facility] Visit from 05/14/2019 in [facility]  Total GAD-7 Score 13 [place] Office Visit from 06/07/2019 in [facility]  Total Score (max 30 points ) 29      PHQ2-9    [place] from 11/30/2021 in [facility] Nutrition from 11/06/2021 [facility] Visit from 07/30/2021 in [facility] Visit from 07/02/2021 in [facility] from 04/02/2021  in [facility] [place]  PHQ-2 Total Score 0 0 '2 1 1  '$ PHQ-9 Total Score -- -- '11 6 1      '$ Flowsheet Row ED from 11/26/2021 in [facility] Clinical Support from 04/02/2021 in [facility] from 03/21/2021 in [facility]  C-SSRS RISK CATEGORY No Risk No Risk No Risk        Review of Systems:  Review of Systems  Musculoskeletal:  Negative for gait problem.  Neurological:  Negative for tremors.  Psychiatric/Behavioral:         Please refer to HPI    Medications: I have reviewed the patient's current medications.  Current Outpatient Medications  Medication Sig Dispense Refill   Acetylcysteine (N-ACETYL CYSTEINE) 600 MG CAPS 1 capsule Orally Twice a day     atorvastatin (LIPITOR) 10 MG tablet Take 1 tablet (10 mg total) by mouth daily. 90 tablet 1   Azelaic Acid 15 % gel Apply topically as needed.     B Complex Vitamins (VITAMIN B COMPLEX) TABS 1 tablet Orally Once daily     betamethasone dipropionate 4.12 % cream 1 application Externally Twice a day     Cholecalciferol 50 MCG (2000 UT) TABS 1 tablet Orally Once a day for 30 day(s)     Golimumab (SIMPONI ARIA IV) Infusion every 8 weeks     lamoTRIgine (LAMICTAL) 100 MG tablet Take one tablet in the morning and take one tablet at lunch. (Patient taking differently: Take 100 mg by mouth daily.) 180 tablet 1   lamoTRIgine (LAMICTAL) 200 MG tablet Take 1 tablet (200 mg total) by mouth at bedtime. 90 tablet 3  levothyroxine (SYNTHROID) 100 MCG tablet Take 1 tablet (100 mcg total) by mouth daily at 6 (six) AM. 90 tablet 3   LORazepam (ATIVAN) 1 MG tablet TAKE 1 TABLET(1 MG) BY MOUTH TWICE DAILY AS NEEDED 60 tablet 2   metroNIDAZOLE (METROGEL) 0.75 % gel Apply to face 1-2 times daily 45 g 1   Omega-3 Fatty Acids (FISH OIL) 1200 MG CAPS Take 1 capsule by mouth in the morning and at bedtime.     sodium bicarbonate 650 MG tablet Take 650 mg by mouth 2  (two) times daily.     traZODone (DESYREL) 50 MG tablet Take one to two tablets at bedtime. 90 tablet 3   No current facility-administered medications for this visit.    Medication Side Effects: None  Allergies:  Allergies  Allergen Reactions   Aripiprazole Other (See Comments)    Parkinsonism      Lactose Intolerance (Gi) Diarrhea   Methotrexate Other (See Comments)    Hair loss, severe stomatitis     Cefdinir Diarrhea    Other reaction(s): Diarrhea Yeast infection and fever; negative c diff   Etanercept Other (See Comments)    Headaches     Exemestane Other (See Comments)    Suicidal thoughts with medication     Fluoxetine Other (See Comments)    Parkinsonism   Methylprednisolone Sodium Succ Other (See Comments)    Agitated mania   Epinephrine Palpitations    tachycardia    Nitrofurantoin Nausea And Vomiting and Rash      Other reaction(s): rash, diarrhea Other reaction(s): rash, diarrhea Other reaction(s): rash, diarrhea Other reaction(s): rash, diarrhea Other reaction(s): rash, diarrhea Other reaction(s): rash, diarrhea    Past Medical History:  Diagnosis Date   Bipolar 1 disorder ([place])    Breast cancer ([place])    Chronic diarrhea    loose stools twice a day on average for years   Chronic kidney disease (CKD), stage III (moderate) ([place])    Depression 1987   Family history of breast cancer    Hospitalization or health care facility admission within last 6 months 04/2019   for fall/seizures   Malignant neoplasm of overlapping sites of left breast in female, estrogen receptor positive ([place]) 03/14/2016   Dx in 09/2014, s/p bilateral mastectomies and ALND, 0/10 LN. 1.4 cm  Grade I invasive lobular, ER and PR +/Her--, Ki 67 <5% Tried anastrozole for one month, but developed suicidal idea   Osteoarthritis, knee 09/24/2019   Xray 09/2019   Parkinson's disease 2012   Personal history of malignant neoplasm of breast    Polyposis of colon    Psoriatic arthritis  ([place])    PTSD (post-traumatic stress disorder)    Secondary hyperparathyroidism ([place])    Tardive dyskinesia    Thyroid disease     Past Medical History, Surgical history, Social history, and Family history were reviewed and updated as appropriate.   Please see review of systems for further details on the patient's review from today.   Objective:   Physical Exam:  There were no vitals taken for this visit.  Physical Exam Constitutional:      General: She is not in acute distress. Musculoskeletal:        General: No deformity.  Neurological:     Mental Status: She is alert and oriented to person, place, and time.     Coordination: Coordination normal.  Psychiatric:        Attention and Perception: Attention and perception normal. She does not perceive auditory  or visual hallucinations.        Mood and Affect: Mood normal. Mood is not anxious or depressed. Affect is not labile, blunt, angry or inappropriate.        Speech: Speech normal.        Behavior: Behavior normal.        Thought Content: Thought content normal. Thought content is not paranoid or delusional. Thought content does not include homicidal or suicidal ideation. Thought content does not include homicidal or suicidal plan.        Cognition and Memory: Cognition and memory normal.        Judgment: Judgment normal.     Comments: Insight intact     Lab Review:     Component Value Date/Time   NA 142 12/10/2021 1044   NA 142 10/17/2021 0000   K 4.8 12/10/2021 1044   CL 109 12/10/2021 1044   CO2 24 12/10/2021 1044   GLUCOSE 88 12/10/2021 1044   BUN 36 (H) 12/10/2021 1044   BUN 30 (A) 10/17/2021 0000   CREATININE 2.45 (H) 12/10/2021 1044   CREATININE 2.29 (H) 05/22/2021 1044   CALCIUM 9.5 12/10/2021 1044   PROT 7.0 12/10/2021 1044   ALBUMIN 4.4 12/10/2021 1044   AST 14 12/10/2021 1044   ALT 12 12/10/2021 1044   ALKPHOS 122 (H) 12/10/2021 1044   BILITOT 0.7 12/10/2021 1044   GFRNONAA 20 (L) 10/29/2021 1055    GFRAA 25 (L) 09/13/2019 1243       Component Value Date/Time   WBC 9.5 10/29/2021 1055   RBC 4.64 10/29/2021 1055   HGB 14.1 10/29/2021 1055   HCT 44.1 10/29/2021 1055   PLT 214 10/29/2021 1055   MCV 95.0 10/29/2021 1055   MCH 30.4 10/29/2021 1055   MCHC 32.0 10/29/2021 1055   RDW 13.2 10/29/2021 1055   LYMPHSABS 3.2 07/02/2021 1048   MONOABS 0.7 07/02/2021 1048   EOSABS 0.2 07/02/2021 1048   BASOSABS 0.1 07/02/2021 1048    L[place] Lvl  Date Value Ref Range Status  05/22/2021 0.7 0.6 - 1.2 mmol/L Final     No results found for: "PHENYTOIN", "PHENOBARB", "VALPROATE", "CBMZ"   .res Assessment: Plan:    Plan  Trazadone '50mg'$  - 1 tablet at bedtime Lorazepam '1mg'$  BID for anxiety - may take one tablet extra for severe anxiety symptoms. Taking one at night routinely. Lamictal '200mg'$  hs Lamictal '150mg'$  every morning.  Reports a correlation of increased GFR with Lamictal  Consider low dose of Gabapentin.  NAC tablets BID - for obsessive thoughts, worry, and rumination - unable to take SSRI's  RTC weekly  Seeing Endocrinologist on 10/23.  Counseled patient regarding potential benefits, risks, and side effects of Lamictal to include potential risk of Stevens-Johnson syndrome. Advised patient to stop taking Lamictal and contact office immediately if rash develops and to seek urgent medical attention if rash is severe and/or spreading quickly.  Discussed potential benefits, risk, and side effects of benzodiazepines to include potential risk of tolerance and dependence, as well as possible drowsiness. Advised patient not to drive if experiencing drowsiness and to take lowest possible effective dose to minimize risk of dependence and tolerance.   Diagnoses and all orders for this visit:  Bipolar I disorder ([place])  Generalized anxiety disorder  PTSD (post-traumatic stress disorder)  Insomnia due to other mental disorder     Please see After Visit Summary for patient  specific instructions.  Future Appointments  Date Time Provider [place]  03/06/2022 11:45 AM  [facility] None  04/15/2022  1:45 PM [facility] HEALTH COACH [facility]  05/08/2022 12:10 PM Shamleffer, Melanie Crazier, MD [facility] None    No orders of the defined types were placed in this encounter.   -------------------------------

## 2022-02-28 ENCOUNTER — Ambulatory Visit (INDEPENDENT_AMBULATORY_CARE_PROVIDER_SITE_OTHER): Payer: Medicare Other | Admitting: Family

## 2022-02-28 ENCOUNTER — Encounter: Payer: Self-pay | Admitting: Family

## 2022-02-28 VITALS — BP 154/92 | HR 63 | Temp 97.7°F | Ht 64.0 in | Wt 187.2 lb

## 2022-02-28 DIAGNOSIS — J020 Streptococcal pharyngitis: Secondary | ICD-10-CM

## 2022-02-28 DIAGNOSIS — R059 Cough, unspecified: Secondary | ICD-10-CM

## 2022-02-28 LAB — POCT RAPID STREP A (OFFICE): Rapid Strep A Screen: POSITIVE — AB

## 2022-02-28 LAB — POC COVID19 BINAXNOW: SARS Coronavirus 2 Ag: NEGATIVE

## 2022-02-28 MED ORDER — AMOXICILLIN 500 MG PO CAPS: 500.0000 mg | ORAL_CAPSULE | Freq: Two times a day (BID) | ORAL | 0 refills | Status: AC

## 2022-02-28 NOTE — Progress Notes (Signed)
Patient ID: Tiffany [name], female    DOB: Dec 19, 1950, 71 y.o.   MRN: [id number]  Chief Complaint  Patient presents with   Cough    sx x 3d    HPI:      URI w/sore throat:  Pt c/o cough with yellow mucus, headache, facial pain, Bilateral ear pain and sore throat. Present for 3 days, Has tried tylenol which did help pain. Reports mild fever, nasal drainage & congestion, no body aches.    Assessment & Plan:  1. Cough, unspecified type - rapid covid & flu neg. advised on increasing fluids, OTC Robitussin cough syrup, cough lozenges prn.  - POCT rapid strep A - POC COVID-19  2. Strep throat - rapid strep positive. Sending AMOX, advised on use & SE. Advised pt to take Tylenol 1,'000mg'$  prn for sore throat pain, and fever. Gargle with warm salt water several tid. OK to use OTC Chloraseptic spray and/or throat lozenges prn. Drink plenty of water.   - POCT rapid strep A - amoxicillin (AMOXIL) 500 MG capsule; Take 1 capsule (500 mg total) by mouth 2 (two) times daily for 10 days.  Dispense: 20 capsule; Refill: 0   Subjective:    Outpatient Medications Prior to Visit  Medication Sig Dispense Refill   Acetylcysteine (N-ACETYL CYSTEINE) 600 MG CAPS 1 capsule Orally Twice a day     atorvastatin (LIPITOR) 10 MG tablet Take 1 tablet (10 mg total) by mouth daily. 90 tablet 1   Azelaic Acid 15 % gel Apply topically as needed.     B Complex Vitamins (VITAMIN B COMPLEX) TABS 1 tablet Orally Once daily     betamethasone dipropionate 3.78 % cream 1 application Externally Twice a day     Cholecalciferol 50 MCG (2000 UT) TABS 1 tablet Orally Once a day for 30 day(s)     Golimumab (SIMPONI ARIA IV) Infusion every 8 weeks     lamoTRIgine (LAMICTAL) 100 MG tablet Take one tablet in the morning and take one tablet at lunch. (Patient taking differently: Take 100 mg by mouth daily.) 180 tablet 1   lamoTRIgine (LAMICTAL) 200 MG tablet Take 1 tablet (200 mg total) by mouth at bedtime. 90 tablet 3    levothyroxine (SYNTHROID) 100 MCG tablet Take 1 tablet (100 mcg total) by mouth daily at 6 (six) AM. 90 tablet 3   LORazepam (ATIVAN) 1 MG tablet TAKE 1 TABLET(1 MG) BY MOUTH TWICE DAILY AS NEEDED 60 tablet 2   metroNIDAZOLE (METROGEL) 0.75 % gel Apply to face 1-2 times daily 45 g 1   Omega-3 Fatty Acids (FISH OIL) 1200 MG CAPS Take 1 capsule by mouth in the morning and at bedtime.     sodium bicarbonate 650 MG tablet Take 650 mg by mouth 2 (two) times daily.     traZODone (DESYREL) 50 MG tablet Take one to two tablets at bedtime. 90 tablet 3   No facility-administered medications prior to visit.   Past Medical History:  Diagnosis Date   Bipolar 1 disorder ([place])    Breast cancer ([place])    Chronic diarrhea    loose stools twice a day on average for years   Chronic kidney disease (CKD), stage III (moderate) ([place])    Depression 1987   Family history of breast cancer    Hospitalization or health care facility admission within last 6 months 04/2019   for fall/seizures   Malignant neoplasm of overlapping sites of left breast in female, estrogen receptor positive ([place]) 03/14/2016  Dx in 09/2014, s/p bilateral mastectomies and ALND, 0/10 LN. 1.4 cm  Grade I invasive lobular, ER and PR +/Her--, Ki 67 <5% Tried anastrozole for one month, but developed suicidal idea   Osteoarthritis, knee 09/24/2019   Xray 09/2019   Parkinson's disease 2012   Personal history of malignant neoplasm of breast    Polyposis of colon    Psoriatic arthritis ([place])    PTSD (post-traumatic stress disorder)    Secondary hyperparathyroidism ([place])    Tardive dyskinesia    Thyroid disease    Past Surgical History:  Procedure Laterality Date   [place]   MASTECTOMY Bilateral 09/19/2014   TONSILLECTOMY  1970   URETERAL REIMPLANTION Bilateral 1974   Allergies  Allergen Reactions   Aripiprazole Other (See Comments)    Parkinsonism       Lactose Intolerance (Gi) Diarrhea   Methotrexate Other (See Comments)    Hair loss, severe stomatitis     Cefdinir Diarrhea    Other reaction(s): Diarrhea Yeast infection and fever; negative c diff   Etanercept Other (See Comments)    Headaches     Exemestane Other (See Comments)    Suicidal thoughts with medication     Fluoxetine Other (See Comments)    Parkinsonism   Methylprednisolone Sodium Succ Other (See Comments)    Agitated mania   Epinephrine Palpitations    tachycardia    Nitrofurantoin Nausea And Vomiting and Rash      Other reaction(s): rash, diarrhea Other reaction(s): rash, diarrhea Other reaction(s): rash, diarrhea Other reaction(s): rash, diarrhea Other reaction(s): rash, diarrhea Other reaction(s): rash, diarrhea      Objective:    Physical Exam Vitals and nursing note reviewed.  Constitutional:      Appearance: Normal appearance.  HENT:     Mouth/Throat:     Mouth: Mucous membranes are moist.     Pharynx: Posterior oropharyngeal erythema present. No pharyngeal swelling, oropharyngeal exudate or uvula swelling.     Tonsils: No tonsillar exudate or tonsillar abscesses.  Cardiovascular:     Rate and Rhythm: Normal rate and regular rhythm.  Pulmonary:     Effort: Pulmonary effort is normal.     Breath sounds: Normal breath sounds.  Musculoskeletal:        General: Normal range of motion.  Lymphadenopathy:     Cervical: Cervical adenopathy present.     Right cervical: Superficial cervical adenopathy present.     Left cervical: No superficial cervical adenopathy.  Skin:    General: Skin is warm and dry.  Neurological:     Mental Status: She is alert.  Psychiatric:        Mood and Affect: Mood normal.        Behavior: Behavior normal.    BP (!) 154/92 (BP Location: Left Arm, Patient Position: Sitting, Cuff Size: Large)   Pulse 63   Temp 97.7 F (36.5 C) (Temporal)   Ht '5\' 4"'$  (1.626 m)   Wt 187 lb 3.2 oz (84.9 kg)   SpO2 99%   BMI  32.13 kg/m  Wt Readings from Last 3 Encounters:  02/28/22 187 lb 3.2 oz (84.9 kg)  02/06/22 196 lb 6.1 oz (89.1 kg)  01/23/22 199 lb (90.3 kg)       Jeanie Sewer, NP

## 2022-03-05 ENCOUNTER — Encounter: Payer: Self-pay | Admitting: Internal Medicine

## 2022-03-06 ENCOUNTER — Other Ambulatory Visit (INDEPENDENT_AMBULATORY_CARE_PROVIDER_SITE_OTHER): Payer: Medicare Other

## 2022-03-06 DIAGNOSIS — E039 Hypothyroidism, unspecified: Secondary | ICD-10-CM | POA: Diagnosis not present

## 2022-03-06 LAB — TSH: TSH: 0.4 u[IU]/mL (ref 0.35–5.50)

## 2022-03-06 LAB — T4, FREE: Free T4: 1.02 ng/dL (ref 0.60–1.60)

## 2022-03-08 ENCOUNTER — Ambulatory Visit (INDEPENDENT_AMBULATORY_CARE_PROVIDER_SITE_OTHER): Payer: Medicare Other | Admitting: Adult Health

## 2022-03-08 ENCOUNTER — Encounter: Payer: Self-pay | Admitting: Adult Health

## 2022-03-08 DIAGNOSIS — F319 Bipolar disorder, unspecified: Secondary | ICD-10-CM | POA: Diagnosis not present

## 2022-03-08 DIAGNOSIS — F5105 Insomnia due to other mental disorder: Secondary | ICD-10-CM

## 2022-03-08 DIAGNOSIS — F4310 Post-traumatic stress disorder, unspecified: Secondary | ICD-10-CM | POA: Diagnosis not present

## 2022-03-08 DIAGNOSIS — F411 Generalized anxiety disorder: Secondary | ICD-10-CM

## 2022-03-08 DIAGNOSIS — F99 Mental disorder, not otherwise specified: Secondary | ICD-10-CM

## 2022-03-08 NOTE — Progress Notes (Signed)
Tiffany [name] [id number] 1950/07/02 71 y.o.  Subjective:   Patient ID:  Tiffany [name] is a 71 y.o. (DOB 1951/01/22) female.  Chief Complaint: No chief complaint on file.   HPI Khadeejah Castner Lauman presents to the office today for follow-up of PTSD, insomnia, GAD and  BPD 1.  Describes mood today as "ok". Pleasant. Reports tearfulness. Mood symptoms - reports anxiety and depression - "comes and goes". Reports some irritability. Reports worry, rumination, and overthinking. Reports mood as variable. Continues to rapid cycle - the other day was within the hour - typically happening every three days or less. Reports periods of disassociation. Recovering from strep throat - taking Amoxicillin. Reports situational stresors and triggers. Feels like medications are helpful. Family supportive. Varying interest and motivation. Taking medications as prescribed. Working with therapist - Olive Bass. Energy levels are variable. Active, does not have a regular exercise routine. Enjoys some usual interests and activities. Married. Lives with husband their daughter. Talking to family and friends.  Appetite improved. Weight loss. Sleep is consistent. Averages 8 hours daily. Focus and concentration "not to well - nothing has changed". Completing tasks. Managing some aspects of household. Retired.  Denies SI or HI.  Denies AH or VH  Denies self harm. Denies substance use.  Endocrinologist reduced Levothyroxine to 68mg only on Sunday.    GRoyalOffice Visit from 02/16/2021 in LSatillaVisit from 05/14/2019 in [place] Total GAD-7 Score 13 2PecktonvilleOffice Visit from 06/07/2019 in [place] Total Score (max 30 points ) 29      PHQ2-9    FTombstonefrom 11/30/2021 in TD[facility]burgCoordination Nutrition from 11/06/2021 in Nutrition and  Diabetes Education Services Office Visit from 07/30/2021 in LMinnehahaVisit from 07/02/2021 in LPeavinefrom 04/02/2021 in LH[place] PHQ-2 Total Score 0 0 '2 1 1  '$ PHQ-9 Total Score -- -- '11 6 1      '$ Flowsheet Row ED from 11/26/2021 in [facility]Emergency Dept Clinical Support from 04/02/2021 in LCoinjockED from 03/21/2021 in MCenterportEmergency Dept  C-SSRS RISK CATEGORY No Risk No Risk No Risk        Review of Systems:  Review of Systems  Musculoskeletal:  Negative for gait problem.  Neurological:  Negative for tremors.  Psychiatric/Behavioral:         Please refer to HPI    Medications: I have reviewed the patient's current medications.  Current Outpatient Medications  Medication Sig Dispense Refill   Acetylcysteine (N-ACETYL CYSTEINE) 600 MG CAPS 1 capsule Orally Twice a day     amoxicillin (AMOXIL) 500 MG capsule Take 1 capsule (500 mg total) by mouth 2 (two) times daily for 10 days. 20 capsule 0   atorvastatin (LIPITOR) 10 MG tablet Take 1 tablet (10 mg total) by mouth daily. 90 tablet 1   Azelaic Acid 15 % gel Apply topically as needed.     B Complex Vitamins (VITAMIN B COMPLEX) TABS 1 tablet Orally Once daily     betamethasone dipropionate 04.09% cream 1 application Externally Twice a day     Cholecalciferol 50 MCG (2000 UT) TABS 1 tablet Orally Once a day for 30 day(s)     Golimumab (SIMPONI ARIA IV) Infusion every 8 weeks  lamoTRIgine (LAMICTAL) 100 MG tablet Take one tablet in the morning and take one tablet at lunch. (Patient taking differently: Take 100 mg by mouth daily.) 180 tablet 1   lamoTRIgine (LAMICTAL) 200 MG tablet Take 1 tablet (200 mg total) by mouth at bedtime. 90 tablet 3   levothyroxine (SYNTHROID) 100 MCG tablet Take 1 tablet (100 mcg total) by mouth daily at 6 (six) AM. 90 tablet 3   LORazepam  (ATIVAN) 1 MG tablet TAKE 1 TABLET(1 MG) BY MOUTH TWICE DAILY AS NEEDED 60 tablet 2   metroNIDAZOLE (METROGEL) 0.75 % gel Apply to face 1-2 times daily 45 g 1   Omega-3 Fatty Acids (FISH OIL) 1200 MG CAPS Take 1 capsule by mouth in the morning and at bedtime.     sodium bicarbonate 650 MG tablet Take 650 mg by mouth 2 (two) times daily.     traZODone (DESYREL) 50 MG tablet Take one to two tablets at bedtime. 90 tablet 3   No current facility-administered medications for this visit.    Medication Side Effects: None  Allergies:  Allergies  Allergen Reactions   Aripiprazole Other (See Comments)    Parkinsonism      Lactose Intolerance (Gi) Diarrhea   Methotrexate Other (See Comments)    Hair loss, severe stomatitis     Cefdinir Diarrhea    Other reaction(s): Diarrhea Yeast infection and fever; negative c diff   Etanercept Other (See Comments)    Headaches     Exemestane Other (See Comments)    Suicidal thoughts with medication     Fluoxetine Other (See Comments)    Parkinsonism   Methylprednisolone Sodium Succ Other (See Comments)    Agitated mania   Epinephrine Palpitations    tachycardia    Nitrofurantoin Nausea And Vomiting and Rash      Other reaction(s): rash, diarrhea Other reaction(s): rash, diarrhea Other reaction(s): rash, diarrhea Other reaction(s): rash, diarrhea Other reaction(s): rash, diarrhea Other reaction(s): rash, diarrhea    Past Medical History:  Diagnosis Date   Bipolar 1 disorder ([place])    Breast cancer ([place])    Chronic diarrhea    loose stools twice a day on average for years   Chronic kidney disease (CKD), stage III (moderate) ([place])    Depression 1987   Family history of breast cancer    Hospitalization or health care facility admission within last 6 months 04/2019   for fall/seizures   Malignant neoplasm of overlapping sites of left breast in female, estrogen receptor positive ([place]) 03/14/2016   Dx in 09/2014, s/p bilateral  mastectomies and ALND, 0/10 LN. 1.4 cm  Grade I invasive lobular, ER and PR +/Her--, Ki 67 <5% Tried anastrozole for one month, but developed suicidal idea   Osteoarthritis, knee 09/24/2019   Xray 09/2019   Parkinson's disease 2012   Personal history of malignant neoplasm of breast    Polyposis of colon    Psoriatic arthritis ([place])    PTSD (post-traumatic stress disorder)    Secondary hyperparathyroidism ([place])    Tardive dyskinesia    Thyroid disease     Past Medical History, Surgical history, Social history, and Family history were reviewed and updated as appropriate.   Please see review of systems for further details on the patient's review from today.   Objective:   Physical Exam:  There were no vitals taken for this visit.  Physical Exam Constitutional:      General: She is not in acute distress. Musculoskeletal:  General: No deformity.  Neurological:     Mental Status: She is alert and oriented to person, place, and time.     Coordination: Coordination normal.  Psychiatric:        Attention and Perception: Attention and perception normal. She does not perceive auditory or visual hallucinations.        Mood and Affect: Mood normal. Mood is not anxious or depressed. Affect is not labile, blunt, angry or inappropriate.        Speech: Speech normal.        Behavior: Behavior normal.        Thought Content: Thought content normal. Thought content is not paranoid or delusional. Thought content does not include homicidal or suicidal ideation. Thought content does not include homicidal or suicidal plan.        Cognition and Memory: Cognition and memory normal.        Judgment: Judgment normal.     Comments: Insight intact     Lab Review:     Component Value Date/Time   NA 142 12/10/2021 1044   NA 142 10/17/2021 0000   K 4.8 12/10/2021 1044   CL 109 12/10/2021 1044   CO2 24 12/10/2021 1044   GLUCOSE 88 12/10/2021 1044   BUN 36 (H) 12/10/2021 1044   BUN 30 (A)  10/17/2021 0000   CREATININE 2.45 (H) 12/10/2021 1044   CREATININE 2.29 (H) 05/22/2021 1044   CALCIUM 9.5 12/10/2021 1044   PROT 7.0 12/10/2021 1044   ALBUMIN 4.4 12/10/2021 1044   AST 14 12/10/2021 1044   ALT 12 12/10/2021 1044   ALKPHOS 122 (H) 12/10/2021 1044   BILITOT 0.7 12/10/2021 1044   GFRNONAA 20 (L) 10/29/2021 1055   GFRAA 25 (L) 09/13/2019 1243       Component Value Date/Time   WBC 9.5 10/29/2021 1055   RBC 4.64 10/29/2021 1055   HGB 14.1 10/29/2021 1055   HCT 44.1 10/29/2021 1055   PLT 214 10/29/2021 1055   MCV 95.0 10/29/2021 1055   MCH 30.4 10/29/2021 1055   MCHC 32.0 10/29/2021 1055   RDW 13.2 10/29/2021 1055   LYMPHSABS 3.2 07/02/2021 1048   MONOABS 0.7 07/02/2021 1048   EOSABS 0.2 07/02/2021 1048   BASOSABS 0.1 07/02/2021 1048    Lithium Lvl  Date Value Ref Range Status  05/22/2021 0.7 0.6 - 1.2 mmol/L Final     No results found for: "PHENYTOIN", "PHENOBARB", "VALPROATE", "CBMZ"   .res Assessment: Plan:    Plan  Trazadone '50mg'$  - 1 tablet at bedtime Lorazepam '1mg'$  BID for anxiety - may take one tablet extra for severe anxiety symptoms. Taking one at night routinely. Lamictal '200mg'$  hs Lamictal '150mg'$  every morning.  Reports a correlation of increased GFR with Lamictal  Consider low dose of Gabapentin.  NAC tablets BID - for obsessive thoughts, worry, and rumination - unable to take SSRI's  RTC weekly  Seeing Endocrinologist on 10/23.  Counseled patient regarding potential benefits, risks, and side effects of Lamictal to include potential risk of Stevens-Johnson syndrome. Advised patient to stop taking Lamictal and contact office immediately if rash develops and to seek urgent medical attention if rash is severe and/or spreading quickly.  Discussed potential benefits, risk, and side effects of benzodiazepines to include potential risk of tolerance and dependence, as well as possible drowsiness. Advised patient not to drive if experiencing  drowsiness and to take lowest possible effective dose to minimize risk of dependence and tolerance.   Diagnoses and all orders for  this visit:  Bipolar I disorder ([place])  Generalized anxiety disorder  PTSD (post-traumatic stress disorder)  Insomnia due to other mental disorder     Please see After Visit Summary for patient specific instructions.  Future Appointments  Date Time Provider [place]  03/25/2022 12:00 PM Jalyssa Fleisher, Berdie Ogren, NP CP-CP None  04/15/2022  1:45 PM [facility] HEALTH COACH [facility]  05/08/2022 12:10 PM Shamleffer, Melanie Crazier, MD [facility] None    No orders of the defined types were placed in this encounter.   -------------------------------

## 2022-03-25 ENCOUNTER — Ambulatory Visit (INDEPENDENT_AMBULATORY_CARE_PROVIDER_SITE_OTHER): Payer: Medicare Other | Admitting: Adult Health

## 2022-03-25 ENCOUNTER — Encounter: Payer: Self-pay | Admitting: Adult Health

## 2022-03-25 DIAGNOSIS — F319 Bipolar disorder, unspecified: Secondary | ICD-10-CM | POA: Diagnosis not present

## 2022-03-25 DIAGNOSIS — F4310 Post-traumatic stress disorder, unspecified: Secondary | ICD-10-CM | POA: Diagnosis not present

## 2022-03-25 DIAGNOSIS — F5105 Insomnia due to other mental disorder: Secondary | ICD-10-CM

## 2022-03-25 DIAGNOSIS — F411 Generalized anxiety disorder: Secondary | ICD-10-CM | POA: Diagnosis not present

## 2022-03-25 DIAGNOSIS — F99 Mental disorder, not otherwise specified: Secondary | ICD-10-CM

## 2022-03-25 NOTE — Progress Notes (Signed)
Tiffany [name] [id number] 30-Oct-1950 72 y.o.  Subjective:   Patient ID:  Tiffany [name] is a 72 y.o. (DOB 1950/12/27) female.  Chief Complaint: No chief complaint on file.   HPI [name] presents to the office today for follow-up of PTSD, insomnia, GAD and  BPD 1.  Accompanied by husband.  Describes mood today as "not too good". Pleasant. Reports tearfulness. Mood symptoms - reports feeling depressed and hopeless. Reports anxiety. Reports worry, rumination, and overthinking. Stating "I feel scared and don't know what to do". Reports mood instability - rapid cycling - "not sure what's going on". Trying to stay in reality and not disassociate. Stating "I don't feel like myself at all and I miss that". Reports agitation and irritability. Reports ongoing situational stresors and triggers. Feels like medications are helpful. Family supportive. Varying interest and motivation. Taking medications as prescribed. Working with therapist - Olive Bass. Energy levels are variable. Active, does not have a regular exercise routine. Enjoys some usual interests and activities. Married. Lives with husband their daughter. Talking to family and friends.  Appetite improved. Weight loss. Sleep is more variable. Averages 8 hours of broken sleep. Focus and concentration difficulties. Completing tasks. Managing some aspects of household. Retired.  Denies SI or HI.  Denies AH or VH  Denies self harm. Denies substance use.  Endocrinologist reduced Levothyroxine to 6mg only on Sunday.  Previous medications: Celexa, Zyprexa, Tegretol, Depakote, Serzone, Topamax, Seroquel, Effexor, Lexapro, Desipramine, Neurontin, Abilify, Geodon, Propanolol, Cymbalta, Cogentin, Trihexyphenadyl, Sinmmet, Provigil, Selegiline, Requip, Amantadine, Prozac, Mirapex, Azilect, Metoclopramide, Baclofen, Artane, Namenda, Latuda.    GBentonOffice Visit from 02/16/2021 in [place] Visit from 05/14/2019 in [place] Total GAD-7 Score 13 2HettingerOffice Visit from 06/07/2019 in [place] Total Score (max 30 points ) 29      PHQ2-9    FMuirfrom 11/30/2021 in TG[facility] CenterCoordination Nutrition from 11/06/2021 in Nutrition and Diabetes Education Services Office Visit from 07/30/2021 in LBus[place]Visit from 07/02/2021 in L[place]from 04/02/2021 in LH[place] PHQ-2 Total Score 0 0 '2 1 1  '$ PHQ-9 Total Score -- -- '11 6 1      '$ Flowsheet Row ED from 11/26/2021 in [facility]Emergency Dept Clinical Support from 04/02/2021 in LBet[place]ED from 03/21/2021 in [facility]Emergency Dept  C-SSRS RISK CATEGORY No Risk No Risk No Risk        Review of Systems:  Review of Systems  Musculoskeletal:  Negative for gait problem.  Neurological:  Negative for tremors.  Psychiatric/Behavioral:         Please refer to HPI    Medications: I have reviewed the patient's current medications.  Current Outpatient Medications  Medication Sig Dispense Refill   Acetylcysteine (N-ACETYL CYSTEINE) 600 MG CAPS 1 capsule Orally Twice a day     atorvastatin (LIPITOR) 10 MG tablet Take 1 tablet (10 mg total) by mouth daily. 90 tablet 1   Azelaic Acid 15 % gel Apply topically as needed.     B Complex Vitamins (VITAMIN B COMPLEX) TABS 1 tablet Orally Once daily     betamethasone dipropionate 09.32% cream 1 application Externally Twice a day     Cholecalciferol 50 MCG (2000 UT) TABS 1 tablet  Orally Once a day for 30 day(s)     Golimumab (SIMPONI ARIA IV) Infusion every 8 weeks     lamoTRIgine (LAMICTAL) 100 MG tablet Take one tablet in the morning and take one tablet at lunch. (Patient taking differently: Take 100 mg by  mouth daily.) 180 tablet 1   lamoTRIgine (LAMICTAL) 200 MG tablet Take 1 tablet (200 mg total) by mouth at bedtime. 90 tablet 3   levothyroxine (SYNTHROID) 100 MCG tablet Take 1 tablet (100 mcg total) by mouth daily at 6 (six) AM. 90 tablet 3   LORazepam (ATIVAN) 1 MG tablet TAKE 1 TABLET(1 MG) BY MOUTH TWICE DAILY AS NEEDED 60 tablet 2   metroNIDAZOLE (METROGEL) 0.75 % gel Apply to face 1-2 times daily 45 g 1   Omega-3 Fatty Acids (FISH OIL) 1200 MG CAPS Take 1 capsule by mouth in the morning and at bedtime.     sodium bicarbonate 650 MG tablet Take 650 mg by mouth 2 (two) times daily.     traZODone (DESYREL) 50 MG tablet Take one to two tablets at bedtime. 90 tablet 3   No current facility-administered medications for this visit.    Medication Side Effects: None   Allergies:  Allergies  Allergen Reactions   Aripiprazole Other (See Comments)    Parkinsonism      Lactose Intolerance (Gi) Diarrhea   Methotrexate Other (See Comments)    Hair loss, severe stomatitis     Cefdinir Diarrhea    Other reaction(s): Diarrhea Yeast infection and fever; negative c diff   Etanercept Other (See Comments)    Headaches     Exemestane Other (See Comments)    Suicidal thoughts with medication     Fluoxetine Other (See Comments)    Parkinsonism   Methylprednisolone Sodium Succ Other (See Comments)    Agitated mania   Epinephrine Palpitations    tachycardia    Nitrofurantoin Nausea And Vomiting and Rash      Other reaction(s): rash, diarrhea Other reaction(s): rash, diarrhea Other reaction(s): rash, diarrhea Other reaction(s): rash, diarrhea Other reaction(s): rash, diarrhea Other reaction(s): rash, diarrhea    Past Medical History:  Diagnosis Date   Bipolar 1 disorder ([place])    Breast cancer ([place])    Chronic diarrhea    loose stools twice a day on average for years   Chronic kidney disease (CKD), stage III (moderate) ([place])    Depression 1987   Family history of breast  cancer    Hospitalization or health care facility admission within last 6 months 04/2019   for fall/seizures   Malignant neoplasm of overlapping sites of left breast in female, estrogen receptor positive ([place]) 03/14/2016   Dx in 09/2014, s/p bilateral mastectomies and ALND, 0/10 LN. 1.4 cm  Grade I invasive lobular, ER and PR +/Her--, Ki 67 <5% Tried anastrozole for one month, but developed suicidal idea   Osteoarthritis, knee 09/24/2019   Xray 09/2019   Parkinson's disease 2012   Personal history of malignant neoplasm of breast    Polyposis of colon    Psoriatic arthritis ([place])    PTSD (post-traumatic stress disorder)    Secondary hyperparathyroidism ([place])    Tardive dyskinesia    Thyroid disease     Past Medical History, Surgical history, Social history, and Family history were reviewed and updated as appropriate.   Please see review of systems for further details on the patient's review from today.   Objective:   Physical Exam:  There were no vitals taken for  this visit.  Physical Exam Constitutional:      General: She is not in acute distress. Musculoskeletal:        General: No deformity.  Neurological:     Mental Status: She is alert and oriented to person, place, and time.     Coordination: Coordination normal.  Psychiatric:        Attention and Perception: Attention and perception normal. She does not perceive auditory or visual hallucinations.        Mood and Affect: Mood normal. Mood is not anxious or depressed. Affect is not labile, blunt, angry or inappropriate.        Speech: Speech normal.        Behavior: Behavior normal.        Thought Content: Thought content normal. Thought content is not paranoid or delusional. Thought content does not include homicidal or suicidal ideation. Thought content does not include homicidal or suicidal plan.        Cognition and Memory: Cognition and memory normal.        Judgment: Judgment normal.     Comments: Insight intact      Lab Review:     Component Value Date/Time   NA 142 12/10/2021 1044   NA 142 10/17/2021 0000   K 4.8 12/10/2021 1044   CL 109 12/10/2021 1044   CO2 24 12/10/2021 1044   GLUCOSE 88 12/10/2021 1044   BUN 36 (H) 12/10/2021 1044   BUN 30 (A) 10/17/2021 0000   CREATININE 2.45 (H) 12/10/2021 1044   CREATININE 2.29 (H) 05/22/2021 1044   CALCIUM 9.5 12/10/2021 1044   PROT 7.0 12/10/2021 1044   ALBUMIN 4.4 12/10/2021 1044   AST 14 12/10/2021 1044   ALT 12 12/10/2021 1044   ALKPHOS 122 (H) 12/10/2021 1044   BILITOT 0.7 12/10/2021 1044   GFRNONAA 20 (L) 10/29/2021 1055   GFRAA 25 (L) 09/13/2019 1243       Component Value Date/Time   WBC 9.5 10/29/2021 1055   RBC 4.64 10/29/2021 1055   HGB 14.1 10/29/2021 1055   HCT 44.1 10/29/2021 1055   PLT 214 10/29/2021 1055   MCV 95.0 10/29/2021 1055   MCH 30.4 10/29/2021 1055   MCHC 32.0 10/29/2021 1055   RDW 13.2 10/29/2021 1055   LYMPHSABS 3.2 07/02/2021 1048   MONOABS 0.7 07/02/2021 1048   EOSABS 0.2 07/02/2021 1048   BASOSABS 0.1 07/02/2021 1048    Lithium Lvl  Date Value Ref Range Status  05/22/2021 0.7 0.6 - 1.2 mmol/L Final     No results found for: "PHENYTOIN", "PHENOBARB", "VALPROATE", "CBMZ"   .res Assessment: Plan:    Plan  Trazadone '50mg'$  - 1 tablet at bedtime Lorazepam '1mg'$  BID for anxiety - may take one tablet extra for severe anxiety symptoms. Taking one at night routinely. Lamictal '200mg'$  hs Lamictal '150mg'$  every morning.  Reports a correlation of increased GFR with Lamictal  Consider low dose of Gabapentin.  NAC tablets BID - for obsessive thoughts, worry, and rumination - unable to take SSRI's  RTC weekly  Seeing Endocrinologist on 10/23.  Counseled patient regarding potential benefits, risks, and side effects of Lamictal to include potential risk of Stevens-Johnson syndrome. Advised patient to stop taking Lamictal and contact office immediately if rash develops and to seek urgent medical attention if  rash is severe and/or spreading quickly.  Discussed potential benefits, risk, and side effects of benzodiazepines to include potential risk of tolerance and dependence, as well as possible drowsiness. Advised patient not  to drive if experiencing drowsiness and to take lowest possible effective dose to minimize risk of dependence and tolerance.  There are no diagnoses linked to this encounter.   Please see After Visit Summary for patient specific instructions.  Future Appointments  Date Time Provider [place]  04/04/2022  1:40 PM Alfonsa Vaile, Berdie Ogren, NP CP-CP None  04/15/2022  1:45 PM [facility] HEALTH COACH [facility]  05/08/2022 12:10 PM Shamleffer, Melanie Crazier, MD [facility] None    No orders of the defined types were placed in this encounter.   -------------------------------

## 2022-04-02 ENCOUNTER — Encounter: Payer: Self-pay | Admitting: Adult Health

## 2022-04-02 ENCOUNTER — Ambulatory Visit (INDEPENDENT_AMBULATORY_CARE_PROVIDER_SITE_OTHER): Payer: Medicare Other | Admitting: Adult Health

## 2022-04-02 DIAGNOSIS — F99 Mental disorder, not otherwise specified: Secondary | ICD-10-CM

## 2022-04-02 DIAGNOSIS — F411 Generalized anxiety disorder: Secondary | ICD-10-CM

## 2022-04-02 DIAGNOSIS — F5105 Insomnia due to other mental disorder: Secondary | ICD-10-CM | POA: Diagnosis not present

## 2022-04-02 DIAGNOSIS — F319 Bipolar disorder, unspecified: Secondary | ICD-10-CM

## 2022-04-02 DIAGNOSIS — F4310 Post-traumatic stress disorder, unspecified: Secondary | ICD-10-CM

## 2022-04-02 NOTE — Progress Notes (Signed)
Tiffany [name] [id number] March 06, 1951 72 y.o.  Subjective:   Patient ID:  Tiffany [name] is a 72 y.o. (DOB April 13, 1950) female.  Chief Complaint: No chief complaint on file.   HPI Kaymarie Wynn Bivins presents to the office today for follow-up of  PTSD, insomnia, GAD and  BPD 1.  Accompanied by husband.  Describes mood today as "not good". Pleasant. Reports tearfulness - "a lot yesterday afternoon". Mood symptoms - reports feeling depressed - having some good days. Reports cycling - but is trying to take advantage of things when she feels good. Reports anxious and irritable - "sometimes". Reports worry, rumination, and overthinking. Mood has worsened since last visit. Reports severe arthritic pain - has prednisone - not started using advil. Stating "I haven't been doing well". Reports recent hallucinations - not fearful or frightening. Saw a man in her room in a white t-shirt - feet not on the ground - happened once. Concerned Trazadone may be a contributor - started Jul 13, 2021. Feels like medications are helpful - plans to leave off the Tradone. Family supportive. Varying interest and motivation. Taking medications as prescribed. Working with therapist - Olive Bass. Energy levels are variable. Active, does not have a regular exercise routine. Enjoys some usual interests and activities. Married. Lives with husband their daughter. Talking to family and friends.  Appetite improved. Weight loss. Sleep is more variable. Averages 8 hours of broken sleep. Focus and concentration difficulties. Completing tasks. Managing some aspects of household. Retired.  Denies SI or HI.  Denies AH or VH  Denies self harm. Denies substance use.  Endocrinologist reduced Levothyroxine to 73mg only on Sunday.  Previous medications: Celexa, Zyprexa, Tegretol, Depakote, Serzone, Topamax, Seroquel, Effexor, Lexapro, Desipramine, Neurontin, Abilify, Geodon, Propanolol, Cymbalta, Cogentin, Trihexyphenadyl, Sinmmet, Provigil,  Selegiline, Requip, Amantadine, Prozac, Mirapex, Azilect, Metoclopramide, Baclofen, Artane, Namenda, Latuda.   [place]Office [place]20[place] Total GAD-7 Score 13 [place] [facility]Office [facility]  Total Score (max 30 points ) 29      PHQ2-9    FFoot of Tenfrom 11/30/2021 in TJoseph CityCoord[facility] Nutrition from 11/06/2021 in [place] 07/[place]/2021 in [place] 07[place]02/2021 in [place]20[place] PHQ-2 Total Score 0 0 '2 1 1  '$ PHQ-9 Total Score -- -- '11 6 1      '$ FPicture RocksED from 11/26/2021 [facility] at D[place]ballfrom 04/02/2021 in [place]ED [facility]/21/20[facility] DTown [place]' CountryNo Risk No Risk No Risk        Review of Systems:  Review of Systems  Musculoskeletal:  Negative for gait problem.  Neurological:  Negative for tremors.  Psychiatric/Behavioral:         Please refer to HPI    Medications: I have reviewed the patient's current medications.  Current Outpatient Medications  Medication Sig Dispense Refill   Acetylcysteine (N-ACETYL CYSTEINE) 600 MG CAPS 1 capsule Orally Twice a day     atorvastatin (LIPITOR) 10 MG tablet Take 1 tablet (10 mg total) by mouth daily. 90 tablet 1  Azelaic Acid 15 % gel Apply topically as needed.     B Complex Vitamins (VITAMIN B COMPLEX) TABS 1 tablet Orally Once daily     betamethasone dipropionate 3.81 % cream 1 application Externally Twice a day     Cholecalciferol  50 MCG (2000 UT) TABS 1 tablet Orally Once a day for 30 day(s)     Golimumab (SIMPONI ARIA IV) Infusion every 8 weeks     lamoTRIgine (LAMICTAL) 100 MG tablet Take one tablet in the morning and take one tablet at lunch. (Patient taking differently: Take 100 mg by mouth daily.) 180 tablet 1   lamoTRIgine (LAMICTAL) 200 MG tablet Take 1 tablet (200 mg total) by mouth at bedtime. 90 tablet 3   levothyroxine (SYNTHROID) 100 MCG tablet Take 1 tablet (100 mcg total) by mouth daily at 6 (six) AM. 90 tablet 3   LORazepam (ATIVAN) 1 MG tablet TAKE 1 TABLET(1 MG) BY MOUTH TWICE DAILY AS NEEDED 60 tablet 2   metroNIDAZOLE (METROGEL) 0.75 % gel Apply to face 1-2 times daily 45 g 1   Omega-3 Fatty Acids (FISH OIL) 1200 MG CAPS Take 1 capsule by mouth in the morning and at bedtime.     sodium bicarbonate 650 MG tablet Take 650 mg by mouth 2 (two) times daily.     traZODone (DESYREL) 50 MG tablet Take one to two tablets at bedtime. 90 tablet 3   No current facility-administered medications for this visit.    Medication Side Effects: None  Allergies:  Allergies  Allergen Reactions   Aripiprazole Other (See Comments)    Parkinsonism      Lactose Intolerance (Gi) Diarrhea   Methotrexate Other (See Comments)    Hair loss, severe stomatitis     Cefdinir Diarrhea    Other reaction(s): Diarrhea Yeast infection and fever; negative c diff   Etanercept Other (See Comments)    Headaches     Exemestane Other (See Comments)    Suicidal thoughts with medication     Fluoxetine Other (See Comments)    Parkinsonism   Methylprednisolone Sodium Succ Other (See Comments)    Agitated mania   Epinephrine Palpitations    tachycardia    Nitrofurantoin Nausea And Vomiting and Rash      Other reaction(s): rash, diarrhea Other reaction(s): rash, diarrhea Other reaction(s): rash, diarrhea Other reaction(s): rash, diarrhea Other reaction(s): rash, diarrhea Other reaction(s): rash, diarrhea    Past  Medical History:  Diagnosis Date   Bipolar 1 disorder ([place])    Breast cancer ([place])    Chronic diarrhea    loose stools twice a day on average for years   Chronic kidney disease (CKD), stage III (moderate) ([place])    Depression 1987   Family history of breast cancer    Hospitalization or health care facility admission within last 6 months 04/2019   for fall/seizures   Malignant neoplasm of overlapping sites of left breast in female, estrogen receptor positive ([place]) 03/14/2016   Dx in 09/2014, s/p bilateral mastectomies and ALND, 0/10 LN. 1.4 cm  Grade I invasive lobular, ER and PR +/Her--, Ki 67 <5% Tried anastrozole for one month, but developed suicidal idea   Osteoarthritis, knee 09/24/2019   Xray 09/2019   Parkinson's disease 2012   Personal history of malignant neoplasm of breast    Polyposis of colon    Psoriatic arthritis ([place])    PTSD (post-traumatic stress disorder)    Secondary hyperparathyroidism ([place])    Tardive dyskinesia    Thyroid  disease     Past Medical History, Surgical history, Social history, and Family history were reviewed and updated as appropriate.   Please see review of systems for further details on the patient's review from today.   Objective:   Physical Exam:  There were no vitals taken for this visit.  Physical Exam Constitutional:      General: She is not in acute distress. Musculoskeletal:        General: No deformity.  Neurological:     Mental Status: She is alert and oriented to person, place, and time.     Coordination: Coordination normal.  Psychiatric:        Attention and Perception: Attention and perception normal. She does not perceive auditory or visual hallucinations.        Mood and Affect: Mood normal. Mood is not anxious or depressed. Affect is not labile, blunt, angry or inappropriate.        Speech: Speech normal.        Behavior: Behavior normal.        Thought Content: Thought content normal. Thought content is not paranoid or  delusional. Thought content does not include homicidal or suicidal ideation. Thought content does not include homicidal or suicidal plan.        Cognition and Memory: Cognition and memory normal.        Judgment: Judgment normal.     Comments: Insight intact     Lab Review:     Component Value Date/Time   NA 142 12/10/2021 1044   NA 142 10/17/2021 0000   K 4.8 12/10/2021 1044   CL 109 12/10/2021 1044   CO2 24 12/10/2021 1044   GLUCOSE 88 12/10/2021 1044   BUN 36 (H) 12/10/2021 1044   BUN 30 (A) 10/17/2021 0000   CREATININE 2.45 (H) 12/10/2021 1044   CREATININE 2.29 (H) 05/22/2021 1044   CALCIUM 9.5 12/10/2021 1044   PROT 7.0 12/10/2021 1044   ALBUMIN 4.4 12/10/2021 1044   AST 14 12/10/2021 1044   ALT 12 12/10/2021 1044   ALKPHOS 122 (H) 12/10/2021 1044   BILITOT 0.7 12/10/2021 1044   GFRNONAA 20 (L) 10/29/2021 1055   GFRAA 25 (L) 09/13/2019 1243       Component Value Date/Time   WBC 9.5 10/29/2021 1055   RBC 4.64 10/29/2021 1055   HGB 14.1 10/29/2021 1055   HCT 44.1 10/29/2021 1055   PLT 214 10/29/2021 1055   MCV 95.0 10/29/2021 1055   MCH 30.4 10/29/2021 1055   MCHC 32.0 10/29/2021 1055   RDW 13.2 10/29/2021 1055   LYMPHSABS 3.2 07/02/2021 1048   MONOABS 0.7 07/02/2021 1048   EOSABS 0.2 07/02/2021 1048   BASOSABS 0.1 07/02/2021 1048    Lithium Lvl  Date Value Ref Range Status  05/22/2021 0.7 0.6 - 1.2 mmol/L Final     No results found for: "PHENYTOIN", "PHENOBARB", "VALPROATE", "CBMZ"   .res Assessment: Plan:    Plan  D/C Trazadone '50mg'$  - 1 tablet at bedtime - possible hallucinations  Lorazepam '1mg'$  BID for anxiety - may take one tablet extra for severe anxiety symptoms. Taking one at night routinely. Lamictal '200mg'$  hs Lamictal '150mg'$  every morning.  Reports a correlation of increased GFR with Lamictal  Consider low dose of Gabapentin.  NAC tablets BID - for obsessive thoughts, worry, and rumination - unable to take SSRI's  RTC weekly  Seeing  Endocrinologist on 10/23.  Counseled patient regarding potential benefits, risks, and side effects of Lamictal to include potential  risk of Stevens-Johnson syndrome. Advised patient to stop taking Lamictal and contact office immediately if rash develops and to seek urgent medical attention if rash is severe and/or spreading quickly.  Discussed potential benefits, risk, and side effects of benzodiazepines to include potential risk of tolerance and dependence, as well as possible drowsiness. Advised patient not to drive if experiencing drowsiness and to take lowest possible effective dose to minimize risk of dependence and tolerance.   Diagnoses and all orders for this visit:  Bipolar I disorder ([place])  Generalized anxiety disorder  PTSD (post-traumatic stress disorder)  Insomnia due to other mental disorder     Please see After Visit Summary for patient specific instructions.  Future Appointments  Date Time Provider [place]  04/10/2022 12:00 PM Raequan Vanschaick, Berdie Ogren, NP [facility] None  04/15/2022  1:30 PM [facility] HEALTH COACH [facility]  05/08/2022 12:10 PM Shamleffer, Melanie Crazier, MD [facility] None    No orders of the defined types were placed in this encounter.   -------------------------------

## 2022-04-03 ENCOUNTER — Emergency Department (HOSPITAL_BASED_OUTPATIENT_CLINIC_OR_DEPARTMENT_OTHER)
Admission: EM | Admit: 2022-04-03 | Discharge: 2022-04-03 | Disposition: A | Discharge status: Home / Self Care | Payer: Medicare Other | Attending: Emergency Medicine | Admitting: Emergency Medicine

## 2022-04-03 ENCOUNTER — Emergency Department (HOSPITAL_BASED_OUTPATIENT_CLINIC_OR_DEPARTMENT_OTHER): Payer: Medicare Other

## 2022-04-03 ENCOUNTER — Other Ambulatory Visit: Payer: Self-pay

## 2022-04-03 ENCOUNTER — Encounter (HOSPITAL_BASED_OUTPATIENT_CLINIC_OR_DEPARTMENT_OTHER): Payer: Self-pay

## 2022-04-03 DIAGNOSIS — R0602 Shortness of breath: Secondary | ICD-10-CM | POA: Diagnosis not present

## 2022-04-03 DIAGNOSIS — Z20822 Contact with and (suspected) exposure to covid-19: Secondary | ICD-10-CM | POA: Insufficient documentation

## 2022-04-03 DIAGNOSIS — Z853 Personal history of malignant neoplasm of breast: Secondary | ICD-10-CM | POA: Insufficient documentation

## 2022-04-03 DIAGNOSIS — Z79899 Other long term (current) drug therapy: Secondary | ICD-10-CM | POA: Insufficient documentation

## 2022-04-03 DIAGNOSIS — E039 Hypothyroidism, unspecified: Secondary | ICD-10-CM | POA: Diagnosis not present

## 2022-04-03 DIAGNOSIS — I129 Hypertensive chronic kidney disease with stage 1 through stage 4 chronic kidney disease, or unspecified chronic kidney disease: Secondary | ICD-10-CM | POA: Diagnosis not present

## 2022-04-03 DIAGNOSIS — N184 Chronic kidney disease, stage 4 (severe): Secondary | ICD-10-CM | POA: Insufficient documentation

## 2022-04-03 LAB — CBC
HCT: 38.9 % (ref 36.0–46.0)
Hemoglobin: 12.8 g/dL (ref 12.0–15.0)
MCH: 30.5 pg (ref 26.0–34.0)
MCHC: 32.9 g/dL (ref 30.0–36.0)
MCV: 92.8 fL (ref 80.0–100.0)
Platelets: 203 10*3/uL (ref 150–400)
RBC: 4.19 MIL/uL (ref 3.87–5.11)
RDW: 13.1 % (ref 11.5–15.5)
WBC: 9.1 10*3/uL (ref 4.0–10.5)
nRBC: 0 % (ref 0.0–0.2)

## 2022-04-03 LAB — D-DIMER, QUANTITATIVE: D-Dimer, Quant: <0.27 ug/mL-FEU (ref 0.00–0.50)

## 2022-04-03 LAB — BASIC METABOLIC PANEL
Anion gap: 12 (ref 5–15)
BUN: 28 mg/dL — ABNORMAL HIGH (ref 8–23)
CO2: 21 mmol/L — ABNORMAL LOW (ref 22–32)
Calcium: 9.7 mg/dL (ref 8.9–10.3)
Chloride: 108 mmol/L (ref 98–111)
Creatinine, Ser: 2.33 mg/dL — ABNORMAL HIGH (ref 0.44–1.00)
GFR, Estimated: 22 mL/min — ABNORMAL LOW (ref >60)
Glucose, Bld: 90 mg/dL (ref 70–99)
Potassium: 4 mmol/L (ref 3.5–5.1)
Sodium: 141 mmol/L (ref 135–145)

## 2022-04-03 LAB — RESP PANEL BY RT-PCR (RSV, FLU A&B, COVID)  RVPGX2
Influenza A by PCR: NEGATIVE
Influenza B by PCR: NEGATIVE
Resp Syncytial Virus by PCR: NEGATIVE
SARS Coronavirus 2 by RT PCR: NEGATIVE

## 2022-04-03 LAB — TROPONIN I (HIGH SENSITIVITY)
Troponin I (High Sensitivity): 4 ng/L (ref <18)
Troponin I (High Sensitivity): 4 ng/L (ref <18)

## 2022-04-03 NOTE — ED Notes (Signed)
Alert, NAD, calm, interactive. BP improved. C/o SOB. States, "BP cuff takes my breath away". Denies pain or nausea. Family x2 at [facility]. Pending CTA.

## 2022-04-03 NOTE — ED Provider Notes (Cosign Needed Addendum)
[facility] Provider Note   CSN: [id number] Arrival date & time: 04/03/22  1407     History  Chief Complaint  Patient presents with   Shortness of Breath    Tiffany [name] is a 72 y.o. female.   Shortness of Breath   72 year old female presents emergency department complaints of shortness of breath.  Patient has acute onset symptoms while she was doing physical therapy earlier today doing warm up exercises.  States that symptoms have been persistent since onset.  States that she had recent medicine changes of discontinuing trazodone secondary to medication side effect.  Reports history of similar symptoms approximately 1 week ago in her house.  Today, she also states that both of her upper extremities feel weak.  Denies visual disturbance, gait abnormalities, slurred speech, facial droop, sensory deficits in upper or lower extremities, weakness in lower extremities.  Denies any chest pain, fever, cough, nausea, vomiting, abdominal pain, urinary/vaginal symptoms, change in bowel habits.  Past medical history significant for hypothyroidism, tardive dyskinesia, osteopenia, breast cancer, CKD stage IV, bipolar 1 disorder, hypertension, psoriatic arthritis  Home Medications Prior to Admission medications   Medication Sig Start Date End Date Taking? Authorizing Provider  Acetylcysteine (N-ACETYL CYSTEINE) 600 MG CAPS 1 capsule Orally Twice a day 11/07/21   [provider]  atorvastatin (LIPITOR) 10 MG tablet Take 1 tablet (10 mg total) by mouth daily. 12/11/21   Inda Coke, PA  Azelaic Acid 15 % gel Apply topically as needed.    [provider]  B Complex Vitamins (VITAMIN B COMPLEX) TABS 1 tablet Orally Once daily    [provider]  betamethasone dipropionate 3.78 % cream 1 application Externally Twice a day    [provider]  Cholecalciferol 50 MCG (2000 UT) TABS 1 tablet Orally Once a day for 30 day(s)     [provider]  Golimumab ([place] ARIA IV) Infusion every 8 weeks 09/14/21   [provider]  lamoTRIgine (LAMICTAL) 100 MG tablet Take one tablet in the morning and take one tablet at lunch. Patient taking differently: Take 100 mg by mouth daily. 10/04/21   Mozingo, Berdie Ogren, NP  lamoTRIgine (LAMICTAL) 200 MG tablet Take 1 tablet (200 mg total) by mouth at bedtime. 02/05/22   Mozingo, Berdie Ogren, NP  levothyroxine (SYNTHROID) 100 MCG tablet Take 1 tablet (100 mcg total) by mouth daily at 6 (six) AM. 01/08/22   Shamleffer, Melanie Crazier, MD  LORazepam (ATIVAN) 1 MG tablet TAKE 1 TABLET(1 MG) BY MOUTH TWICE DAILY AS NEEDED 02/05/22   Mozingo, Berdie Ogren, NP  metroNIDAZOLE (METROGEL) 0.75 % gel Apply to face 1-2 times daily 01/04/22   Inda Coke, PA  Omega-3 Fatty Acids (FISH OIL) 1200 MG CAPS Take 1 capsule by mouth in the morning and at bedtime. 10/02/20   [provider]  sodium bicarbonate 650 MG tablet Take 650 mg by mouth 2 (two) times daily. 10/17/21   [provider]  traZODone (DESYREL) 50 MG tablet Take one to two tablets at bedtime. 10/11/21   Mozingo, Berdie Ogren, NP      Allergies    Aripiprazole, Lactose intolerance (gi), Methotrexate, Cefdinir, Etanercept, Exemestane, Fluoxetine, Methylprednisolone sodium succ, Epinephrine, and Nitrofurantoin    Review of Systems   Review of Systems  Respiratory:  Positive for shortness of breath.   All other systems reviewed and are negative.   Physical Exam Updated Vital Signs BP (!) 146/115   Pulse (!) 54  Temp 98.5 F (36.9 C) (Oral)   Resp 18   Ht '5\' 4"'$  (1.626 m)   Wt 84.9 kg   SpO2 100%   BMI 32.13 kg/m  Physical Exam Vitals and nursing note reviewed.  Constitutional:      General: She is not in acute distress.    Appearance: She is well-developed.     Comments: Patient alert and oriented x 3 but with flat affect.  HENT:     Head: Normocephalic and atraumatic.   Eyes:     Conjunctiva/sclera: Conjunctivae normal.  Cardiovascular:     Rate and Rhythm: Normal rate and regular rhythm.     Heart sounds: No murmur heard. Pulmonary:     Effort: Pulmonary effort is normal. No respiratory distress.     Breath sounds: Normal breath sounds. No wheezing, rhonchi or rales.  Abdominal:     Palpations: Abdomen is soft.     Tenderness: There is no abdominal tenderness. There is no guarding.  Musculoskeletal:        General: No swelling.     Cervical back: Neck supple.     Right lower leg: No edema.     Left lower leg: No edema.  Skin:    General: Skin is warm and dry.     Capillary Refill: Capillary refill takes less than 2 seconds.  Neurological:     Mental Status: She is alert.     Comments: Alert and oriented to self, place, time and event.   Speech is fluent, clear without dysarthria or dysphasia.   Strength symmetric in upper/lower extremities   Sensation intact in upper/lower extremities   No pronator drift.  Normal finger-to-nose and feet tapping.  CN I not tested  CN II not tested  CN III, IV, VI PERRLA and EOMs intact bilaterally  CN V Intact sensation to sharp and light touch to the face  CN VII facial movements symmetric  CN VIII not tested  CN IX, X no uvula deviation, symmetric rise of soft palate  CN XI 5/5 SCM and trapezius strength bilaterally  CN XII Midline tongue protrusion, symmetric L/R movements     Psychiatric:        Mood and Affect: Mood normal.     ED Results / Procedures / Treatments   Labs (all labs ordered are listed, but only abnormal results are displayed) Labs Reviewed  BASIC METABOLIC PANEL - Abnormal; Notable for the following components:      Result Value   CO2 21 (*)    BUN 28 (*)    Creatinine, Ser 2.33 (*)    GFR, Estimated 22 (*)    All other components within normal limits  RESP PANEL BY RT-PCR (RSV, FLU A&B, COVID)  RVPGX2  CBC  D-DIMER, QUANTITATIVE  TROPONIN I (HIGH SENSITIVITY)   TROPONIN I (HIGH SENSITIVITY)    EKG EKG Interpretation  Date/Time:  Wednesday April 03 2022 14:16:17 EST Ventricular Rate:  66 PR Interval:  160 QRS Duration: 84 QT Interval:  402 QTC Calculation: 421 R Axis:   28 Text Interpretation: Normal sinus rhythm Confirmed by Cindee Lame [phone]) on 04/03/2022 3:12:53 PM  Radiology DG Chest Portable 1 View  Result Date: 04/03/2022 CLINICAL DATA:  Shortness of breath, some chest tightness EXAM: PORTABLE CHEST 1 VIEW COMPARISON:  Portable exam 1426 hours compared to 10/29/2021 FINDINGS: Normal heart size, mediastinal contours, and pulmonary vascularity. Minimal scarring LEFT costophrenic angle. Lungs otherwise clear. No acute infiltrate, pleural effusion, or pneumothorax. Osseous structures unremarkable.  IMPRESSION: No acute abnormalities. Electronically Signed   By: Lavonia Dana M.D.   On: 04/03/2022 14:32    Procedures Procedures    Medications Ordered in ED Medications - No data to display  ED Course/ Medical Decision Making/ A&P Clinical Course as of 04/03/22 1925  Wed Apr 03, 2022  1447 Shob PT.  [CR]    Clinical Course User Index [CR] Wilnette Kales, PA                             Medical Decision Making Amount and/or Complexity of Data Reviewed Labs: ordered. Radiology: ordered.   This patient presents to the ED for concern of shortness of breath, this involves an extensive number of treatment options, and is a complaint that carries with it a high risk of complications and morbidity.  The differential diagnosis includes The causes for shortness of breath include but are not limited to Cardiac (AHF, pericardial effusion and tamponade, arrhythmias, ischemia, etc) Respiratory (COPD, asthma, pneumonia, pneumothorax, primary pulmonary hypertension, PE/VQ mismatch) Hematological (anemia) Polypharmacy   Co morbidities that complicate the patient evaluation  See HPI   Additional history obtained:  Additional  history obtained from EMR External records from outside source obtained and reviewed including hospital records   Lab Tests:  I Ordered, and personally interpreted labs.  The pertinent results include: No leukocytosis noted.  No evidence anemia.  Platelets within normal range.  Patient at baseline renal dysfunction with GFR of 22, creatinine 2.33 and BUN of 28.  D-dimer negative; low suspicion for acute PE.  Initial troponin of 4 with repeat 4; EKG concerning for sinus rhythm without acute ischemic changes from prior EKGs performed.  Imaging Studies ordered:  I ordered imaging studies including chest x-ray I independently visualized and interpreted imaging which showed no acute cardiopulmonary abnormalities I agree with the radiologist interpretation   Cardiac Monitoring: / EKG:  The patient was maintained on a cardiac monitor.  I personally viewed and interpreted the cardiac monitored which showed an underlying rhythm of: sinus rhythm without acute ischemic changes from prior EKGs performed.   Consultations Obtained:  I requested consultation with attending physician Dr. Mayra Neer who independently evaluated the patient and was in agreement with treatment plan going forward.   Problem List / ED Course / Critical interventions / Medication management  Shortness of breath Reevaluation of the patient showed that the patient resolved I have reviewed the patients home medicines and have made adjustments as needed   Social Determinants of Health:  Denies tobacco, illicit drug use.   Test / Admission - Considered:  Shortness of breath Vitals signs significant for hypertension with a blood pressure consistently elevated 146/82. Otherwise within normal range and stable throughout visit. Laboratory/imaging studies significant for: See above Patient with overall negative workup.  Patient not tachypneic with oxygen saturation on room air 99 to 100% throughout visit.  Delta negative  troponin with no new EKG findings indicative of ischemia so doubt ACS.  Heart score of 0-3.  Patient D-dimer negative with no risk factors concerning for PE so doubt PE.  No evidence of pneumonia, pneumothorax.  No auscultatory abnormalities on physical exam concerning for acute asthma/COPD exacerbation.  Doubt aortic dissection.  Unsure of exact etiology of patient's feelings of shortness of breath.  Patient noted complete resolution of symptoms with time elapsed on the emergency department.  Affect seemed to completely change which seem to be flat prior as well as  patient's generalized weakness improved spontaneously independent of medical therapy.  Questionable underlying polypharmacy as attributing to patient's symptoms.  Patient recommended follow-up with primary care for reassessment of symptoms.  Attending physician Dr. Mayra Neer was consulted regarding the patient who independently evaluated the patient and was agreement with treatment plan going forward. Treatment plan discussed at length with patient and family and they acknowledge understanding and were agreeable to said plan.   Worrisome signs and symptoms were discussed with the patient, and the patient acknowledged understanding to return to the ED if noticed. Patient was stable upon discharge.          Final Clinical Impression(s) / ED Diagnoses Final diagnoses:  Shortness of breath    Rx / DC Orders ED Discharge Orders     None           Wilnette Kales, [place] 04/03/22 1925    Tegeler, Gwenyth Allegra, MD 04/04/22 1110

## 2022-04-03 NOTE — ED Notes (Signed)
CTA d/c'd, renal function elevated. Pt alert, NAD, calm, interactive, no changes. VSS.

## 2022-04-03 NOTE — Discharge Instructions (Signed)
Note the workup today was overall reassuring.  As discussed, heart enzyme looks well.  Chest x-ray was without abnormality.  EKG looks similar in appearance to others performed.  Recommend follow-up with primary care as well as cardiology outpatient for reevaluation of your symptoms.  Please do not hesitate to return to emergency department if the worrisome signs and symptoms we discussed become apparent.

## 2022-04-03 NOTE — ED Notes (Signed)
ED PA at BS 

## 2022-04-03 NOTE — ED Triage Notes (Signed)
Patient here POV from MD Office.  Was originally upstairs with personal Trainer when she became SOB and has worsened since. Some Chest tightness as well. Went to Rheumatologist today as scheduled and recommended ED Evaluation for SOB.   Did take 1 mg of Ativan at 1330 for Anxiety.   NAD Noted during Triage. A&Ox4. GCS 15. BIB wheelchair.

## 2022-04-04 ENCOUNTER — Ambulatory Visit: Payer: Medicare Other | Admitting: Adult Health

## 2022-04-10 ENCOUNTER — Ambulatory Visit (INDEPENDENT_AMBULATORY_CARE_PROVIDER_SITE_OTHER): Payer: Medicare Other | Admitting: Physician Assistant

## 2022-04-10 ENCOUNTER — Encounter: Payer: Self-pay | Admitting: Adult Health

## 2022-04-10 ENCOUNTER — Ambulatory Visit (INDEPENDENT_AMBULATORY_CARE_PROVIDER_SITE_OTHER): Payer: Medicare Other | Admitting: Adult Health

## 2022-04-10 ENCOUNTER — Encounter: Payer: Self-pay | Admitting: Physician Assistant

## 2022-04-10 VITALS — BP 124/70 | HR 59 | Temp 97.7°F | Ht 64.0 in | Wt 189.0 lb

## 2022-04-10 DIAGNOSIS — F4310 Post-traumatic stress disorder, unspecified: Secondary | ICD-10-CM

## 2022-04-10 DIAGNOSIS — F5105 Insomnia due to other mental disorder: Secondary | ICD-10-CM

## 2022-04-10 DIAGNOSIS — F319 Bipolar disorder, unspecified: Secondary | ICD-10-CM | POA: Diagnosis not present

## 2022-04-10 DIAGNOSIS — R41844 Frontal lobe and executive function deficit: Secondary | ICD-10-CM

## 2022-04-10 DIAGNOSIS — R531 Weakness: Secondary | ICD-10-CM | POA: Diagnosis not present

## 2022-04-10 DIAGNOSIS — F411 Generalized anxiety disorder: Secondary | ICD-10-CM | POA: Diagnosis not present

## 2022-04-10 DIAGNOSIS — F99 Mental disorder, not otherwise specified: Secondary | ICD-10-CM

## 2022-04-10 DIAGNOSIS — R0602 Shortness of breath: Secondary | ICD-10-CM | POA: Diagnosis not present

## 2022-04-10 NOTE — Progress Notes (Signed)
Tiffany [name] [id number] [birth year] 72 y.o.  Subjective:   Patient ID:  Tiffany [name] is a 71 y.o. (DOB 26-Jul-1950) female.  Chief Complaint: No chief complaint on file.   HPI  Tiffany [name] presents to the office today for follow-up of  PTSD, insomnia, GAD and  BPD 1.  Collateral reviewed since last visit - ED visit.  Accompanied by husband.  Describes mood today as "ok". Pleasant. Reports tearfulness. Mood symptoms - reports decreased depression - still variable throughout the day. Reports anxiety most of the time. Reports irritability at times. Reports worry, rumination, and overthinking. Mood is variable. Stating "I'm doing alright today". Denies hallucinations. Feels like medications are helpful. Family supportive and doing well. Improved interest and motivation. Taking medications as prescribed. Working with therapist - Olive Bass. Energy levels improved. Active, does not have a regular exercise routine. Enjoys some usual interests and activities. Getting out more. Married. Lives with husband their daughter. Talking to family and friends.  Appetite improved. Weight loss. Sleep is more variable. Averages 8 hours of broken sleep. Focus and concentration difficulties - "a little bit". Completing tasks. Managing some aspects of household. Retired.  Denies SI or HI.  Denies AH or VH  Denies self harm. Denies substance use.  Endocrinologist reduced Levothyroxine to 51mg only on Sunday.  Previous medications: Celexa, Zyprexa, Tegretol, Depakote, Serzone, Topamax, Seroquel, Effexor, Lexapro, Desipramine, Neurontin, Abilify, Geodon, Propanolol, Cymbalta, Cogentin, Trihexyphenadyl, Sinmmet, Provigil, Selegiline, Requip, Amantadine, Prozac, Mirapex, Azilect, Metoclopramide, Baclofen, Artane, Namenda, Latuda.   [place]issonvilleOffice [place]20[place]Visit [place]/20[place] in [place] [facility]atesVisit from  05/14/2019 in [place] Total GAD-7 Score '17 13 21      '$ [place]New CaliforniaOffice [facility]  Total Score (max 30 points ) 29      PHQ2-9    [place]teVisit from 04/10/2022 in [place]from 11/30/2021 in TUniversity ParkCoord[facility] Nutrition from 11/06/2021 in [place]at [place]from 07/30/2021 in LShickshinnyVisit from 07/02/2021 in [place] PHQ-2 Total Score 1 0 0 2 1  PHQ-9 Total Score 3 -- -- 11 6      FBridge CityED from [facility] from 11/26/2021 in [facility]Emer[facility] at DBelleair Shorefrom 04/02/2021 in LBremerNo Risk No Risk No Risk        Review of Systems:  Review of Systems  Musculoskeletal:  Negative for gait problem.  Neurological:  Negative for tremors.  Psychiatric/Behavioral:         Please refer to HPI    Medications: I have reviewed the patient's current medications.  Current Outpatient Medications  Medication Sig Dispense Refill   Acetylcysteine (N-ACETYL CYSTEINE) 600 MG CAPS 1 capsule Orally Twice a day     atorvastatin (LIPITOR) 10 MG tablet Take 1 tablet (10 mg total) by mouth daily. 90 tablet 1   Azelaic Acid 15 % gel Apply topically as needed.     B Complex Vitamins (VITAMIN B COMPLEX) TABS 1 tablet Orally Once daily     betamethasone dipropionate 06.96% cream 1 application Externally Twice a day  Cholecalciferol 50 MCG (2000 UT) TABS 1 tablet Orally Once a day for 30 day(s)     Golimumab (SIMPONI ARIA IV) Infusion every 8 weeks     lamoTRIgine (LAMICTAL) 100 MG tablet Take one tablet in the morning and take one tablet at lunch.  (Patient taking differently: Take 100 mg by mouth daily.) 180 tablet 1   lamoTRIgine (LAMICTAL) 200 MG tablet Take 1 tablet (200 mg total) by mouth at bedtime. 90 tablet 3   levothyroxine (SYNTHROID) 100 MCG tablet TAKE 100 MCG MONDAY THRU SATURDAY AND 50 MCG ON SUNDAY     LORazepam (ATIVAN) 1 MG tablet TAKE 1 TABLET(1 MG) BY MOUTH TWICE DAILY AS NEEDED 60 tablet 2   metroNIDAZOLE (METROGEL) 0.75 % gel Apply to face 1-2 times daily 45 g 1   Omega-3 Fatty Acids (FISH OIL) 1200 MG CAPS Take 1 capsule by mouth in the morning and at bedtime.     Probiotic Product (ALIGN) 4 MG CAPS Take 1 capsule by mouth daily in the afternoon.     sodium bicarbonate 650 MG tablet Take 650 mg by mouth 2 (two) times daily.     No current facility-administered medications for this visit.    Medication Side Effects: None  Allergies:  Allergies  Allergen Reactions   Aripiprazole Other (See Comments)    Parkinsonism      Lactose Intolerance (Gi) Diarrhea   Methotrexate Other (See Comments)    Hair loss, severe stomatitis     Cefdinir Diarrhea    Other reaction(s): Diarrhea Yeast infection and fever; negative c diff   Etanercept Other (See Comments)    Headaches     Exemestane Other (See Comments)    Suicidal thoughts with medication     Fluoxetine Other (See Comments)    Parkinsonism   Methylprednisolone Sodium Succ Other (See Comments)    Agitated mania   Epinephrine Palpitations    tachycardia    Nitrofurantoin Nausea And Vomiting and Rash      Other reaction(s): rash, diarrhea Other reaction(s): rash, diarrhea Other reaction(s): rash, diarrhea Other reaction(s): rash, diarrhea Other reaction(s): rash, diarrhea Other reaction(s): rash, diarrhea    Past Medical History:  Diagnosis Date   Bipolar 1 disorder ([place])    Breast cancer ([place])    Chronic diarrhea    loose stools twice a day on average for years   Chronic kidney disease (CKD), stage III (moderate) ([place])    Depression 1987    Family history of breast cancer    Hospitalization or health care facility admission within last 6 months 04/2019   for fall/seizures   Malignant neoplasm of overlapping sites of left breast in female, estrogen receptor positive ([place]) 03/14/2016   Dx in 09/2014, s/p bilateral mastectomies and ALND, 0/10 LN. 1.4 cm  Grade I invasive lobular, ER and PR +/Her--, Ki 67 <5% Tried anastrozole for one month, but developed suicidal idea   Osteoarthritis, knee 09/24/2019   Xray 09/2019   Parkinson's disease 2012   Personal history of malignant neoplasm of breast    Polyposis of colon    Psoriatic arthritis ([place])    PTSD (post-traumatic stress disorder)    Secondary hyperparathyroidism ([place])    Tardive dyskinesia    Thyroid disease     Past Medical History, Surgical history, Social history, and Family history were reviewed and updated as appropriate.   Please see review of systems for further details on the patient's review from today.   Objective:   Physical Exam:  There were no vitals taken for this visit.  Physical Exam Constitutional:      General: She is not in acute distress. Musculoskeletal:        General: No deformity.  Neurological:     Mental Status: She is alert and oriented to person, place, and time.     Coordination: Coordination normal.  Psychiatric:        Attention and Perception: Attention and perception normal. She does not perceive auditory or visual hallucinations.        Mood and Affect: Mood normal. Mood is not anxious or depressed. Affect is not labile, blunt, angry or inappropriate.        Speech: Speech normal.        Behavior: Behavior normal.        Thought Content: Thought content normal. Thought content is not paranoid or delusional. Thought content does not include homicidal or suicidal ideation. Thought content does not include homicidal or suicidal plan.        Cognition and Memory: Cognition and memory normal.        Judgment: Judgment normal.      Comments: Insight intact     Lab Review:     Component Value Date/Time   NA 141 04/03/2022 1425   NA 142 10/17/2021 0000   K 4.0 04/03/2022 1425   CL 108 04/03/2022 1425   CO2 21 (L) 04/03/2022 1425   GLUCOSE 90 04/03/2022 1425   BUN 28 (H) 04/03/2022 1425   BUN 30 (A) 10/17/2021 0000   CREATININE 2.33 (H) 04/03/2022 1425   CREATININE 2.29 (H) 05/22/2021 1044   CALCIUM 9.7 04/03/2022 1425   PROT 7.0 12/10/2021 1044   ALBUMIN 4.4 12/10/2021 1044   AST 14 12/10/2021 1044   ALT 12 12/10/2021 1044   ALKPHOS 122 (H) 12/10/2021 1044   BILITOT 0.7 12/10/2021 1044   GFRNONAA 22 (L) 04/03/2022 1425   GFRAA 25 (L) 09/13/2019 1243       Component Value Date/Time   WBC 9.1 04/03/2022 1425   RBC 4.19 04/03/2022 1425   HGB 12.8 04/03/2022 1425   HCT 38.9 04/03/2022 1425   PLT 203 04/03/2022 1425   MCV 92.8 04/03/2022 1425   MCH 30.5 04/03/2022 1425   MCHC 32.9 04/03/2022 1425   RDW 13.1 04/03/2022 1425   LYMPHSABS 3.2 07/02/2021 1048   MONOABS 0.7 07/02/2021 1048   EOSABS 0.2 07/02/2021 1048   BASOSABS 0.1 07/02/2021 1048    Lithium Lvl  Date Value Ref Range Status  05/22/2021 0.7 0.6 - 1.2 mmol/L Final     No results found for: "PHENYTOIN", "PHENOBARB", "VALPROATE", "CBMZ"   .res Assessment: Plan:    Plan:  Lorazepam '1mg'$  BID for anxiety - may take one tablet extra for severe anxiety symptoms. Taking one at night routinely. Lamictal '200mg'$  hs Lamictal '150mg'$  every morning.  Reports a correlation of increased GFR with Lamictal  Consider low dose of Gabapentin.  NAC tablets BID - for obsessive thoughts, worry, and rumination - unable to take SSRI's  RTC weekly  Seeing Endocrinologist on 10/23.  Counseled patient regarding potential benefits, risks, and side effects of Lamictal to include potential risk of Stevens-Johnson syndrome. Advised patient to stop taking Lamictal and contact office immediately if rash develops and to seek urgent medical attention if rash is  severe and/or spreading quickly.  Discussed potential benefits, risk, and side effects of benzodiazepines to include potential risk of tolerance and dependence, as well as possible drowsiness. Advised patient not  to drive if experiencing drowsiness and to take lowest possible effective dose to minimize risk of dependence and tolerance.   There are no diagnoses linked to this encounter.   Please see After Visit Summary for patient specific instructions.  Future Appointments  Date Time Provider [place]  04/15/2022  1:30 PM [facility] [place]  05/08/2022 12:10 PM Shamleffer, Melanie Crazier, MD [facility] None    No orders of the defined types were placed in this encounter.   -------------------------------

## 2022-04-10 NOTE — Progress Notes (Signed)
Tiffany [name] is a 72 y.o. female here for a hospital follow-up.  History of Present Illness:   Chief Complaint  Patient presents with   Follow-up    Pt was seen in the ED on 1/24 for SOB.Pt denies any further symptoms of SOB.    HPI  Patient is here with husband.  She reports that she has had two episodes of sudden onset SOB with exertion. Both times she was exercising. The most recent time this occurred, she went to the ER. She had also experienced bilateral arm numbness/tingling, but now she reports that is unsure if it was unilateral. She had some word-finding issues with this episode as well.   Hospital BMP showed CO2 at 21, BUN at 28, Creatinine at 2.33, GFR at 22, otherwise normal. D-dimer negative. EKG normal. CXR  showed no acute abnormalities. Discharged same-day in stable condition and notified to return if worrisome signs and symptoms recur. She took Ativan afterward, which helped her anxiety. Denies feeling angina symptoms at the time or lingering symptoms afterward.  She has another PT session scheduled for this afternoon.  Reports home BP normally runs at 140/86 but can range from 150/86 to 120/86.  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  Denies: slurred speech, further episodes of weakness, loss of balance, unusual headaches, chest pain, SOB  Past Medical History:  Diagnosis Date   Bipolar 1 disorder ([place])    Chronic diarrhea    loose stools twice a day on average for years   CKD stage G4/A1, GFR 15-29 and albumin creatinine ratio <30 mg/g ([place])    Depression 1987   Malignant neoplasm of overlapping sites of left breast in female, estrogen receptor positive ([place]) 03/14/2016   Dx in 09/2014, s/p bilateral  mastectomies and ALND, 0/10 LN. 1.4 cm  Grade I invasive lobular, ER and PR +/Her--, Ki 67 <5% Tried anastrozole for one month, but developed suicidal idea   Osteoarthritis, knee 09/24/2019   Xray 09/2019   Parkinsonism    Drug induced   Polyposis of colon    Psoriatic arthritis ([place])    PTSD (post-traumatic stress disorder)    Secondary hyperparathyroidism ([place])    Tardive dyskinesia    Thyroid disease      Social History   Tobacco Use   Smoking status: Never   Smokeless tobacco: Never  Vaping Use   Vaping Use: Never used  Substance Use Topics   Alcohol use: Never   Drug use: Never    Past Surgical History:  Procedure Laterality Date   ABDOMINAL HYSTERECTOMY  1987   CHOLECYSTECTOMY  1979   DILATION AND CURETTAGE OF UTERUS  1973   MASTECTOMY Bilateral 09/19/2014   TONSILLECTOMY  1970   URETERAL REIMPLANTION Bilateral 1974    Family History  Problem Relation Age of Onset   Lymphoma Mother 73   Lymphoma Sister 11   Breast cancer Sister 72   Colon polyps Sister    Lung cancer Sister        former smoker   Stroke Maternal Grandfather    Diabetes Paternal Grandfather    Colon cancer Neg Hx    Esophageal cancer Neg Hx    Stomach cancer Neg Hx    Rectal cancer Neg Hx     Allergies  Allergen Reactions  Aripiprazole Other (See Comments)    Parkinsonism      Lactose Intolerance (Gi) Diarrhea   Methotrexate Other (See Comments)    Hair loss, severe stomatitis     Cefdinir Diarrhea    Other reaction(s): Diarrhea Yeast infection and fever; negative c diff   Etanercept Other (See Comments)    Headaches     Exemestane Other (See Comments)    Suicidal thoughts with medication     Fluoxetine Other (See Comments)    Parkinsonism   Methylprednisolone Sodium Succ Other (See Comments)    Agitated mania   Epinephrine Palpitations    tachycardia    Nitrofurantoin Nausea And Vomiting and Rash      Other reaction(s): rash, diarrhea Other reaction(s): rash,  diarrhea Other reaction(s): rash, diarrhea Other reaction(s): rash, diarrhea Other reaction(s): rash, diarrhea Other reaction(s): rash, diarrhea    Current Medications:   Current Outpatient Medications:    Acetylcysteine (N-ACETYL CYSTEINE) 600 MG CAPS, 1 capsule Orally Twice a day, Disp: , Rfl:    atorvastatin (LIPITOR) 10 MG tablet, Take 1 tablet (10 mg total) by mouth daily., Disp: 90 tablet, Rfl: 1   Azelaic Acid 15 % gel, Apply topically as needed., Disp: , Rfl:    B Complex Vitamins (VITAMIN B COMPLEX) TABS, 1 tablet Orally Once daily, Disp: , Rfl:    betamethasone dipropionate 1.49 % cream, 1 application Externally Twice a day, Disp: , Rfl:    Cholecalciferol 50 MCG (2000 UT) TABS, 1 tablet Orally Once a day for 30 day(s), Disp: , Rfl:    Golimumab (SIMPONI ARIA IV), Infusion every 8 weeks, Disp: , Rfl:    lamoTRIgine (LAMICTAL) 100 MG tablet, Take one tablet in the morning and take one tablet at lunch. (Patient taking differently: Take 100 mg by mouth daily.), Disp: 180 tablet, Rfl: 1   lamoTRIgine (LAMICTAL) 200 MG tablet, Take 1 tablet (200 mg total) by mouth at bedtime., Disp: 90 tablet, Rfl: 3   levothyroxine (SYNTHROID) 100 MCG tablet, TAKE 100 MCG MONDAY THRU SATURDAY AND 50 MCG ON SUNDAY, Disp: , Rfl:    LORazepam (ATIVAN) 1 MG tablet, TAKE 1 TABLET(1 MG) BY MOUTH TWICE DAILY AS NEEDED, Disp: 60 tablet, Rfl: 2   metroNIDAZOLE (METROGEL) 0.75 % gel, Apply to face 1-2 times daily, Disp: 45 g, Rfl: 1   Omega-3 Fatty Acids (FISH OIL) 1200 MG CAPS, Take 1 capsule by mouth in the morning and at bedtime., Disp: , Rfl:    Probiotic Product (ALIGN) 4 MG CAPS, Take 1 capsule by mouth daily in the afternoon., Disp: , Rfl:    sodium bicarbonate 650 MG tablet, Take 650 mg by mouth 2 (two) times daily., Disp: , Rfl:    Review of Systems:   Review of Systems  Constitutional:  Negative for fever and malaise/fatigue.  HENT:  Negative for congestion.   Eyes:  Negative for blurred vision.   Respiratory:  Negative for cough and shortness of breath.   Cardiovascular:  Negative for chest pain, palpitations and leg swelling.  Gastrointestinal:  Negative for vomiting.  Musculoskeletal:  Negative for back pain.  Skin:  Negative for rash.  Neurological:  Negative for loss of consciousness and headaches.    Vitals:   Vitals:   04/10/22 1101  BP: 124/70  Pulse: (!) 59  Temp: 97.7 F (36.5 C)  TempSrc: Temporal  SpO2: 97%  Weight: 189 lb (85.7 kg)  Height: '5\' 4"'$  (1.626 m)     Body mass index is 32.44 kg/m.  Physical Exam:   Physical Exam Vitals and nursing note reviewed.  Constitutional:      General: She is not in acute distress.    Appearance: She is well-developed. She is not ill-appearing or toxic-appearing.  Cardiovascular:     Rate and Rhythm: Normal rate and regular rhythm.     Pulses: Normal pulses.     Heart sounds: Normal heart sounds, S1 normal and S2 normal.  Pulmonary:     Effort: Pulmonary effort is normal.     Breath sounds: Normal breath sounds.  Skin:    General: Skin is warm and dry.  Neurological:     Mental Status: She is alert.     GCS: GCS eye subscore is 4. GCS verbal subscore is 5. GCS motor subscore is 6.  Psychiatric:        Speech: Speech normal.        Behavior: Behavior normal. Behavior is cooperative.     Assessment and Plan:   Weakness; Frontal lobe and executive function deficit Exam WNL Her episodes have resolved however I am concerned that this is happening with exertion Will pursue [name] work-up given intermittent, associated, paresthesias and issues with word-finding during these episodes and cardiology work-up given exertional component Referral to cardiology to consider monitor, echo, or other testing I have ordered MRI of brain and MRA of head/neck If any new/worsening sx, recommend ER evaluation  I,Alexander Ruley,acting as a scribe for Inda Coke, PA.,have documented all relevant documentation on the behalf  of Inda Coke, PA,as directed by  Inda Coke, PA while in the presence of Inda Coke, [place].  I, Inda Coke, [place], have reviewed all documentation for this visit. The documentation on 04/10/22 for the exam, diagnosis, procedures, and orders are all accurate and complete.  Time spent with patient today was 40 minutes which consisted of chart review, discussing diagnosis, work up, treatment answering questions and documentation.   Inda Coke, PA-C

## 2022-04-10 NOTE — Patient Instructions (Signed)
It was great to see you!  Please schedule a nurse visit next week to have your blood pressure monitor checked with ours.  We will place referral to cardiology.  We will be in touch about scheduling MRI.  Take care,  Inda Coke PA-C

## 2022-04-12 ENCOUNTER — Encounter: Payer: Self-pay | Admitting: Physician Assistant

## 2022-04-15 ENCOUNTER — Ambulatory Visit (INDEPENDENT_AMBULATORY_CARE_PROVIDER_SITE_OTHER): Payer: Medicare Other

## 2022-04-15 VITALS — Wt 183.0 lb

## 2022-04-15 DIAGNOSIS — Z0000 Encounter for general adult medical examination without abnormal findings: Secondary | ICD-10-CM

## 2022-04-15 NOTE — Patient Instructions (Signed)
Tiffany [name] , Thank you for taking time to come for your Medicare Wellness Visit. I appreciate your ongoing commitment to your health goals. Please review the following plan we discussed and let me know if I can assist you in the future.   These are the goals we discussed:  Goals      Patient Stated     Lose weight and get balance exercise      Patient Stated     Continue Working with PT      Patient Stated     Like to get more strength and balance         This is a list of the screening recommended for you and due dates:  Health Maintenance  Topic Date Due   Zoster (Shingles) Vaccine (1 of 2) 07/09/2022*   DEXA scan (bone density measurement)  08/10/2022*   COVID-19 Vaccine (6 - 2023-24 season) 04/11/2023*   Medicare Annual Wellness Visit  04/16/2023   DTaP/Tdap/Td vaccine (2 - Td or Tdap) 05/25/2025   Colon Cancer Screening  06/12/2031   Pneumonia Vaccine  Completed   Flu Shot  Completed   HPV Vaccine  Aged Out   Mammogram  Discontinued   Hepatitis C Screening: USPSTF Recommendation to screen - Ages 18-79 yo.  Discontinued  *Topic was postponed. The date shown is not the original due date.    Advanced directives: copies in chart   Conditions/risks identified: work on strength and balance   Next appointment: Follow up in one year for your annual wellness visit    Preventive Care 65 Years and Older, Female Preventive care refers to lifestyle choices and visits with your health care provider that can promote health and wellness. What does preventive care include? A yearly physical exam. This is also called an annual well check. Dental exams once or twice a year. Routine eye exams. Ask your health care provider how often you should have your eyes checked. Personal lifestyle choices, including: Daily care of your teeth and gums. Regular physical activity. Eating a healthy diet. Avoiding tobacco and drug use. Limiting alcohol use. Practicing safe sex. Taking low-dose  aspirin every day. Taking vitamin and mineral supplements as recommended by your health care provider. What happens during an annual well check? The services and screenings done by your health care provider during your annual well check will depend on your age, overall health, lifestyle risk factors, and family history of disease. Counseling  Your health care provider may ask you questions about your: Alcohol use. Tobacco use. Drug use. Emotional well-being. Home and relationship well-being. Sexual activity. Eating habits. History of falls. Memory and ability to understand (cognition). Work and work Statistician. Reproductive health. Screening  You may have the following tests or measurements: Height, weight, and BMI. Blood pressure. Lipid and cholesterol levels. These may be checked every 5 years, or more frequently if you are over 47 years old. Skin check. Lung cancer screening. You may have this screening every year starting at age 64 if you have a 30-pack-year history of smoking and currently smoke or have quit within the past 15 years. Fecal occult blood test (FOBT) of the stool. You may have this test every year starting at age 60. Flexible sigmoidoscopy or colonoscopy. You may have a sigmoidoscopy every 5 years or a colonoscopy every 10 years starting at age 49. Hepatitis C blood test. Hepatitis B blood test. Sexually transmitted disease (STD) testing. Diabetes screening. This is done by checking your blood sugar (glucose) after  you have not eaten for a while (fasting). You may have this done every 1-3 years. Bone density scan. This is done to screen for osteoporosis. You may have this done starting at age 70. Mammogram. This may be done every 1-2 years. Talk to your health care provider about how often you should have regular mammograms. Talk with your health care provider about your test results, treatment options, and if necessary, the need for more tests. Vaccines  Your  health care provider may recommend certain vaccines, such as: Influenza vaccine. This is recommended every year. Tetanus, diphtheria, and acellular pertussis (Tdap, Td) vaccine. You may need a Td booster every 10 years. Zoster vaccine. You may need this after age 90. Pneumococcal 13-valent conjugate (PCV13) vaccine. One dose is recommended after age 53. Pneumococcal polysaccharide (PPSV23) vaccine. One dose is recommended after age 49. Talk to your health care provider about which screenings and vaccines you need and how often you need them. This information is not intended to replace advice given to you by your health care provider. Make sure you discuss any questions you have with your health care provider. Document Released: 03/24/2015 Document Revised: 11/15/2015 Document Reviewed: 12/27/2014 Elsevier Interactive Patient Education  2017 [place] Prevention in the Home Falls can cause injuries. They can happen to people of all ages. There are many things you can do to make your home safe and to help prevent falls. What can I do on the outside of my home? Regularly fix the edges of walkways and driveways and fix any cracks. Remove anything that might make you trip as you walk through a door, such as a raised step or threshold. Trim any bushes or trees on the path to your home. Use bright outdoor lighting. Clear any walking paths of anything that might make someone trip, such as rocks or tools. Regularly check to see if handrails are loose or broken. Make sure that both sides of any steps have handrails. Any raised decks and porches should have guardrails on the edges. Have any leaves, snow, or ice cleared regularly. Use sand or salt on walking paths during winter. Clean up any spills in your garage right away. This includes oil or grease spills. What can I do in the bathroom? Use night lights. Install grab bars by the toilet and in the tub and shower. Do not use towel bars as  grab bars. Use non-skid mats or decals in the tub or shower. If you need to sit down in the shower, use a plastic, non-slip stool. Keep the floor dry. Clean up any water that spills on the floor as soon as it happens. Remove soap buildup in the tub or shower regularly. Attach bath mats securely with double-sided non-slip rug tape. Do not have throw rugs and other things on the floor that can make you trip. What can I do in the bedroom? Use night lights. Make sure that you have a light by your bed that is easy to reach. Do not use any sheets or blankets that are too big for your bed. They should not hang down onto the floor. Have a firm chair that has side arms. You can use this for support while you get dressed. Do not have throw rugs and other things on the floor that can make you trip. What can I do in the kitchen? Clean up any spills right away. Avoid walking on wet floors. Keep items that you use a lot in easy-to-reach places. If you  need to reach something above you, use a strong step stool that has a grab bar. Keep electrical cords out of the way. Do not use floor polish or wax that makes floors slippery. If you must use wax, use non-skid floor wax. Do not have throw rugs and other things on the floor that can make you trip. What can I do with my stairs? Do not leave any items on the stairs. Make sure that there are handrails on both sides of the stairs and use them. Fix handrails that are broken or loose. Make sure that handrails are as long as the stairways. Check any carpeting to make sure that it is firmly attached to the stairs. Fix any carpet that is loose or worn. Avoid having throw rugs at the top or bottom of the stairs. If you do have throw rugs, attach them to the floor with carpet tape. Make sure that you have a light switch at the top of the stairs and the bottom of the stairs. If you do not have them, ask someone to add them for you. What else can I do to help prevent  falls? Wear shoes that: Do not have high heels. Have rubber bottoms. Are comfortable and fit you well. Are closed at the toe. Do not wear sandals. If you use a stepladder: Make sure that it is fully opened. Do not climb a closed stepladder. Make sure that both sides of the stepladder are locked into place. Ask someone to hold it for you, if possible. Clearly mark and make sure that you can see: Any grab bars or handrails. First and last steps. Where the edge of each step is. Use tools that help you move around (mobility aids) if they are needed. These include: Canes. Walkers. Scooters. Crutches. Turn on the lights when you go into a dark area. Replace any light bulbs as soon as they burn out. Set up your furniture so you have a clear path. Avoid moving your furniture around. If any of your floors are uneven, fix them. If there are any pets around you, be aware of where they are. Review your medicines with your doctor. Some medicines can make you feel dizzy. This can increase your chance of falling. Ask your doctor what other things that you can do to help prevent falls. This information is not intended to replace advice given to you by your health care provider. Make sure you discuss any questions you have with your health care provider. Document Released: 12/22/2008 Document Revised: 08/03/2015 Document Reviewed: 04/01/2014 Elsevier Interactive Patient Education  2017 [employer].

## 2022-04-15 NOTE — Progress Notes (Addendum)
I connected with  Candiss Norse Chiu on 04/15/22 by a audio enabled telemedicine application and verified that I am speaking with the correct person using two identifiers.  Patient Location: Home  Provider Location: Office/Clinic  I discussed the limitations of evaluation and management by telemedicine. The patient expressed understanding and agreed to proceed.   Subjective:   Tiffany [name] is a 72 y.o. female who presents for Medicare Annual (Subsequent) preventive examination.  Review of Systems     Cardiac Risk Factors include: advanced age (>63mn, >>69women);obesity (BMI >30kg/m2)     Objective:    Today's Vitals   04/15/22 1333  Weight: 183 lb (83 kg)   Body mass index is 31.41 kg/m.     04/15/2022    1:41 PM 04/03/2022    2:15 PM 11/26/2021    6:15 PM 10/29/2021   10:40 AM 04/02/2021    1:04 PM 12/21/2020    3:11 PM 09/05/2020    2:39 PM  Advanced Directives  Does Patient Have a Medical Advance Directive? Yes No Yes Yes Yes Yes Yes  Type of [place]  Living will Living will Healthcare Power of [place] City[place] will  Does patient want to make changes to medical advance directive? No - Patient declined        Copy of [place] Chart? Yes - validated most recent copy scanned in chart (See row information)    Yes - validated most recent copy scanned in chart (See row information)  No - copy requested  Would patient like information on creating a medical advance directive? No - Patient declined No - Patient declined         Current Medications (verified) Outpatient Encounter Medications as of 04/15/2022  Medication Sig   Acetylcysteine (N-ACETYL CYSTEINE) 600 MG CAPS 1 capsule Orally Twice a day   atorvastatin (LIPITOR) 10 MG tablet Take 1 tablet (10 mg total) by mouth daily.   B Complex Vitamins (VITAMIN B COMPLEX) TABS 1 tablet Orally Once  daily   betamethasone dipropionate 09.37% cream 1 application Externally Twice a day   Cholecalciferol 50 MCG (2000 UT) TABS 1 tablet Orally Once a day for 30 day(s)   Golimumab (SIMPONI ARIA IV) Infusion every 8 weeks   lamoTRIgine (LAMICTAL) 100 MG tablet Take one tablet in the morning and take one tablet at lunch. (Patient taking differently: Take 100 mg by mouth daily.)   lamoTRIgine (LAMICTAL) 200 MG tablet Take 1 tablet (200 mg total) by mouth at bedtime.   levothyroxine (SYNTHROID) 100 MCG tablet TAKE 100 MCG MONDAY THRU SATURDAY AND 50 MCG ON SUNDAY   LORazepam (ATIVAN) 1 MG tablet TAKE 1 TABLET(1 MG) BY MOUTH TWICE DAILY AS NEEDED   metroNIDAZOLE (METROGEL) 0.75 % gel Apply to face 1-2 times daily   Omega-3 Fatty Acids (FISH OIL) 1200 MG CAPS Take 1 capsule by mouth in the morning and at bedtime.   Probiotic Product (ALIGN) 4 MG CAPS Take 1 capsule by mouth daily in the afternoon.   sodium bicarbonate 650 MG tablet Take 650 mg by mouth 2 (two) times daily.   [DISCONTINUED] Azelaic Acid 15 % gel Apply topically as needed.   No facility-administered encounter medications on file as of 04/15/2022.    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 1.4 cm  Grade I invasive lobular, ER and PR +/Her--, Ki 67 <5% Tried anastrozole for one month, but developed suicidal idea   Osteoarthritis, knee 09/24/2019   Xray 09/2019   Parkinsonism    Drug  induced   Polyposis of colon    Psoriatic arthritis ([place])    PTSD (post-traumatic stress disorder)    Secondary hyperparathyroidism ([place])    Tardive dyskinesia    Thyroid disease    Past Surgical History:  Procedure Laterality Date   ABDOMINAL HYSTERECTOMY  [place]   MASTECTOMY Bilateral 09/19/2014   TONSILLECTOMY  1970   URETERAL REIMPLANTION Bilateral 1974   Family History  Problem Relation Age of Onset   Lymphoma Mother 49   Lymphoma Sister 26   Breast cancer Sister 38   Colon polyps Sister    Lung cancer Sister        former smoker   Stroke Maternal Grandfather    Diabetes Paternal Grandfather    Colon cancer Neg Hx    Esophageal cancer Neg Hx    Stomach cancer Neg Hx    Rectal cancer Neg Hx    Social History   Socioeconomic History   Marital status: Married    Spouse name: [name]   Number of children: 3   Years of education: Not on file   Highest education level: Not on file  Occupational History   Occupation: retired  Tobacco Use   Smoking status: Never   Smokeless tobacco: Never  Vaping Use   Vaping Use: Never used  Substance and Sexual Activity   Alcohol use: Never   Drug use: Never   Sexual activity: Not Currently  Other Topics Concern   Not on file  Social History Narrative   Moved to area from [place] 08/2018   Lives one story home   Right handed.   Social Determinants of Health   Financial Resource Strain: Low Risk  (04/15/2022)   Overall Financial Resource Strain (CARDIA)    Difficulty of Paying Living Expenses: Not hard at all  Food Insecurity: No Food Insecurity (04/15/2022)   Hunger Vital Sign    Worried About Running Out of Food in the Last Year: Never true    Ran Out of Food in the Last Year: Never true  Transportation Needs: No Transportation Needs (04/15/2022)   PRAPARE - Hydrologist (Medical): No    Lack of Transportation (Non-Medical): No   Physical Activity: Sufficiently Active (04/15/2022)   Exercise Vital Sign    Days of Exercise per Week: 5 days    Minutes of Exercise per Session: 60 min  Stress: No Stress Concern Present (04/02/2021)   [place]    Feeling of Stress : Not at all  Social Connections: Moderately Integrated (04/15/2022)   Social Connection and Isolation Panel [NHANES]    Frequency of Communication with Friends and Family: More than three times a week    Frequency of Social Gatherings with Friends and  Family: More than three times a week    Attends Religious Services: More than 4 times per year    Active Member of Clubs or Organizations: No    Attends Archivist Meetings: Never    Marital Status: Married    Tobacco Counseling Counseling given: Not Answered   Clinical Intake:  Pre-visit preparation completed: Yes  Pain : No/denies pain     BMI - recorded: 31.41 Nutritional Status: BMI > 30  Obese Nutritional Risks: Other (Comment) Diabetes: No  How often do you need to have someone help you when you read instructions, pamphlets, or other written materials from your doctor or pharmacy?: 1 - Never  Diabetic?no  Interpreter Needed?: No  Information entered by :: Charlott Rakes, LPN   Activities of Daily Living    04/12/2022    4:38 PM  In your present state of health, do you have any difficulty performing the following activities:  Hearing? 0  Vision? 0  Difficulty concentrating or making decisions? 1  Walking or climbing stairs? 1  Dressing or bathing? 0  Doing errands, shopping? 1  Preparing Food and eating ? N  Using the Toilet? N  In the past six months, have you accidently leaked urine? N  Do you have problems with loss of bowel control? N  Managing your Medications? Y  Managing your Finances? Y  Housekeeping or managing your Housekeeping? N    Patient Care Team: Inda Coke, [place] as PCP - General  (Physician Assistant) Mozingo, Berdie Ogren, NP as Nurse Practitioner (Psychiatry) Thornton Park, MD as Consulting Physician (Gastroenterology) Star Age, MD as Consulting Physician (Neurology) Rheumatology, [facility] as Consulting Physician (Rheumatology) Cameron Sprang, MD as Consulting Physician (Neurology) [facility], [name] as Consulting Physician (Nephrology) Cameron Sprang, MD as Consulting Physician (Neurology) [name], PhD (Psychiatry)  Indicate any recent Medical Services you may have received from other than [facility] providers in the past year (date may be approximate).     Assessment:   This is a routine wellness examination for [place].  Hearing/Vision screen Hearing Screening - Comments:: Pt denies any hearing issues  Vision Screening - Comments:: Pt follows up with [facility] for annual eye exams   Dietary issues and exercise activities discussed: Current Exercise Habits: Structured exercise class, Type of exercise: Other - see comments (work Physiological scientist), Time (Minutes): 60, Frequency (Times/Week): 5, Weekly Exercise (Minutes/Week): 300   Goals Addressed             This Visit's Progress    Patient Stated       Like to get more strength and balance        Depression Screen    04/15/2022    1:40 PM 04/10/2022   11:06 AM 11/30/2021   10:09 AM 11/06/2021    9:02 AM 09/28/2021    8:23 AM 07/30/2021   10:58 AM 07/02/2021   10:21 AM  PHQ 2/9 Scores  PHQ - 2 Score 0 1 0 0  2 1  PHQ- 9 Score 0 '3    11 6     '$ Information is confidential and restricted. Go to Review Flowsheets to unlock data.    Fall Risk    04/15/2022    1:42 PM 04/12/2022    4:38 PM 04/10/2022   11:02 AM 04/02/2021    1:04 PM 03/09/2021   11:04 AM  Fall Risk   Falls in the past year? 0 0 0 0 0  Number falls in past yr:  0  0 0   Injury with Fall? 0  0 0   Risk for fall due to : Impaired vision   Impaired vision;Impaired balance/gait   Risk for fall due  to: Comment    working on with PT   Follow up Falls prevention discussed  Falls evaluation completed Falls prevention discussed     FALL RISK PREVENTION PERTAINING TO THE HOME:  Any stairs in or around the home? Yes  If so, are there any without handrails? No  Home free of loose throw rugs in walkways, pet beds, electrical cords, etc? Yes  Adequate lighting in your home to reduce risk of falls? Yes   ASSISTIVE DEVICES UTILIZED TO PREVENT FALLS:  Life alert? No  Use of a cane, walker or w/c? No  Grab bars in the bathroom? Yes  Shower chair or bench in shower? Yes  Elevated toilet seat or a handicapped toilet? Yes   TIMED UP AND GO:  Was the test performed? No .  Cognitive Function:    06/07/2019    2:00 PM  MMSE - Mini Mental State Exam  Orientation to time 4  Orientation to Place 5  Registration 3  Attention/ Calculation 5  Recall 3  Language- name 2 objects 2  Language- repeat 1  Language- follow 3 step command 3  Language- read & follow direction 1  Write a sentence 1  Copy design 1  Total score 29        04/15/2022    1:44 PM 04/02/2021    1:06 PM 03/16/2020    2:54 PM  6CIT Screen  What Year? 0 points 0 points 0 points  What month? 0 points 0 points 0 points  What time? 0 points 0 points   Count back from 20 0 points 0 points 0 points  Months in reverse 4 points 0 points 0 points  Repeat phrase 0 points 0 points 0 points  Total Score 4 points 0 points     Immunizations Immunization History  Administered Date(s) Administered   COVID-19, mRNA, vaccine(Comirnaty)12 years and older 12/17/2021   Influenza Inj Mdck Quad Pf 01/05/2019   Influenza, High Dose Seasonal PF 12/13/2014, 01/22/2016, 11/25/2016, 11/18/2017, 11/29/2020, 12/05/2021   Influenza, Seasonal, Injecte, Preservative Fre 01/22/2016   Influenza-Unspecified 12/10/2019   PFIZER(Purple Top)SARS-COV-2 Vaccination 07/09/2019, 08/02/2019, 11/09/2019   PPD Test 02/27/2016   Pfizer Covid-19 Vaccine  Bivalent Booster 35yr & up 01/23/2021   Pneumococcal Conjugate-13 12/13/2014   Pneumococcal Polysaccharide-23 02/07/2016   Respiratory Syncytial Virus Vaccine,Recomb Aduvanted(Arexvy) 12/26/2021   Tdap 05/26/2015    TDAP status: Up to date  Flu Vaccine status: Up to date  Pneumococcal vaccine status: Up to date  Covid-19 vaccine status: Completed vaccines  Qualifies for Shingles Vaccine? Yes   Zostavax completed No   Shingrix Completed?: No.    Education has been provided regarding the importance of this vaccine. Patient has been advised to call insurance company to determine out of pocket expense if they have not yet received this vaccine. Advised may also receive vaccine at local pharmacy or Health Dept. Verbalized acceptance and understanding.  Screening Tests Health Maintenance  Topic Date Due   Zoster Vaccines- Shingrix (1 of 2) 07/09/2022 (Originally 09/24/1969)   DEXA SCAN  08/10/2022 (Originally 08/25/2021)   COVID-19 Vaccine (6 - 2023-24 season) 04/11/2023 (Originally 02/11/2022)   Medicare Annual Wellness (AWV)  04/16/2023   DTaP/Tdap/Td (2 - Td or Tdap) 05/25/2025   COLONOSCOPY (Pts 45-412yrInsurance coverage will need to be  confirmed)  06/12/2031   Pneumonia Vaccine 58+ Years old  Completed   INFLUENZA VACCINE  Completed   HPV VACCINES  Aged Out   MAMMOGRAM  Discontinued   Hepatitis C Screening  Discontinued    Health Maintenance  There are no preventive care reminders to display for this patient.   Colorectal cancer screening: No longer required.   Mammogram status: No longer required due to pt .  Bone Density status: Completed 08/26/19. Results reflect: Bone density results: OSTEOPENIA. Repeat every 2 years.  Additional Screening:  Hepatitis C Screening:  Completed 07/02/21  Vision Screening: Recommended annual ophthalmology exams for early detection of glaucoma and other disorders of the eye. Is the patient up to date with their annual eye exam?  Yes   Who is the provider or what is the name of the office in which the patient attends annual eye exams? [facility]  If pt is not established with a provider, would they like to be referred to a provider to establish care? No .   Dental Screening: Recommended annual dental exams for proper oral hygiene  Community Resource Referral / Chronic Care Management: CRR required this visit?  No   CCM required this visit?  No      Plan:     I have personally reviewed and noted the following in the patient's chart:   Medical and social history Use of alcohol, tobacco or illicit drugs  Current medications and supplements including opioid prescriptions. Patient is not currently taking opioid prescriptions. Functional ability and status Nutritional status Physical activity Advanced directives List of other physicians Hospitalizations, surgeries, and ER visits in previous 12 months Vitals Screenings to include cognitive, depression, and falls Referrals and appointments  In addition, I have reviewed and discussed with patient certain preventive protocols, quality metrics, and best practice recommendations. A written personalized care plan for preventive services as well as general preventive health recommendations were provided to patient.     Willette Brace, LPN   04/16/2033   Nurse Notes: none

## 2022-04-16 ENCOUNTER — Ambulatory Visit (INDEPENDENT_AMBULATORY_CARE_PROVIDER_SITE_OTHER): Payer: Medicare Other

## 2022-04-16 ENCOUNTER — Ambulatory Visit: Payer: Medicare Other | Attending: Internal Medicine | Admitting: Internal Medicine

## 2022-04-16 ENCOUNTER — Encounter: Payer: Self-pay | Admitting: Internal Medicine

## 2022-04-16 ENCOUNTER — Other Ambulatory Visit: Payer: Self-pay | Admitting: Internal Medicine

## 2022-04-16 VITALS — BP 136/80 | HR 55 | Ht 64.0 in | Wt 190.6 lb

## 2022-04-16 DIAGNOSIS — R0600 Dyspnea, unspecified: Secondary | ICD-10-CM | POA: Diagnosis present

## 2022-04-16 DIAGNOSIS — R001 Bradycardia, unspecified: Secondary | ICD-10-CM

## 2022-04-16 NOTE — Progress Notes (Unsigned)
Enrolled for [employer] to mail a ZIO XT long term holter monitor to the patients address on file.  

## 2022-04-16 NOTE — Patient Instructions (Signed)
Medication Instructions:   *If you need a refill on your cardiac medications before your next appointment, please call your pharmacy*   Lab Work:  If you have labs (blood work) drawn today and your tests are completely normal, you will receive your results only by: [place] (if you have MyChart) OR A paper copy in the mail If you have any lab test that is abnormal or we need to change your treatment, we will call you to review the results.   Testing/Procedures: Your physician has requested that you have an echocardiogram. Echocardiography is a painless test that uses sound waves to create images of your heart. It provides your doctor with information about the size and shape of your heart and how well your heart's chambers and valves are working. This procedure takes approximately one hour. There are no restrictions for this procedure. Please do NOT wear cologne, perfume, aftershave, or lotions (deodorant is allowed). Please arrive 15 minutes prior to your appointment time.  ZIO XT- Long Term Monitor Instructions  Your physician has requested you wear a ZIO patch monitor for 14 days.  This is a single patch monitor. [employer] supplies one patch monitor per enrollment. Additional stickers are not available. Please do not apply patch if you will be having a Nuclear Stress Test,  Echocardiogram, Cardiac CT, MRI, or Chest Xray during the period you would be wearing the  monitor. The patch cannot be worn during these tests. You cannot remove and re-apply the  ZIO XT patch monitor.  Your ZIO patch monitor will be mailed 3 day USPS to your address on file. It may take 3-5 days  to receive your monitor after you have been enrolled.  Once you have received your monitor, please review the enclosed instructions. Your monitor  has already been registered assigning a specific monitor serial # to you.  Billing and Patient Assistance Program Information  We have supplied [employer] with any of  your insurance information on file for billing purposes. [employer] offers a sliding scale Patient Assistance Program for patients that do not have  insurance, or whose insurance does not completely cover the cost of the ZIO monitor.  You must apply for the Patient Assistance Program to qualify for this discounted rate.  To apply, please call [employer] at [phone], select option 4, select option 2, ask to apply for  Patient Assistance Program. Theodore Demark will ask your household income, and how many people  are in your household. They will quote your out-of-pocket cost based on that information.  [employer] will also be able to set up a 64-month interest-free payment plan if needed.  Applying the monitor   Shave hair from upper left chest.  Hold abrader disc by orange tab. Rub abrader in 40 strokes over the upper left chest as  indicated in your monitor instructions.  Clean area with 4 enclosed alcohol pads. Let dry.  Apply patch as indicated in monitor instructions. Patch will be placed under collarbone on left  side of chest with arrow pointing upward.  Rub patch adhesive wings for 2 minutes. Remove white label marked "1". Remove the white  label marked "2". Rub patch adhesive wings for 2 additional minutes.  While looking in a mirror, press and release button in center of patch. A small green light will  flash 3-4 times. This will be your only indicator that the monitor has been turned on.  Do not shower for the first 24 hours. You may shower after the first 24 hours.  Press the button if you feel a symptom. You will hear a small click. Record Date, Time and  Symptom in the Patient Logbook.  When you are ready to remove the patch, follow instructions on the last 2 pages of Patient  Logbook. Stick patch monitor onto the last page of Patient Logbook.  Place Patient Logbook in the blue and white box. Use locking tab on box and tape box closed  securely. The blue and white box has prepaid postage  on it. Please place it in the mailbox as  soon as possible. Your physician should have your test results approximately 7 days after the  monitor has been mailed back to [facility].  Call [place] at [phone] if you have questions regarding  your ZIO XT patch monitor. Call them immediately if you see an orange light blinking on your  monitor.  If your monitor falls off in less than 4 days, contact our Monitor department at [phone].  If your monitor becomes loose or falls off after 4 days call [employer] at [phone] for  suggestions on securing your monitor     Follow-Up: At [facility], you and your health needs are our priority.  As part of our continuing mission to provide you with exceptional heart care, we have created designated Provider Care Teams.  These Care Teams include your primary Cardiologist (physician) and Advanced Practice Providers (APPs -  Physician Assistants and Nurse Practitioners) who all work together to provide you with the care you need, when you need it.  We recommend signing up for the patient portal called "MyChart".  Sign up information is provided on this After Visit Summary.  MyChart is used to connect with patients for Virtual Visits (Telemedicine).  Patients are able to view lab/test results, encounter notes, upcoming appointments, etc.  Non-urgent messages can be sent to your provider as well.   To learn more about what you can do with MyChart, go to [email or web address].

## 2022-04-16 NOTE — Progress Notes (Signed)
Cardiology Office Note   Date:  04/16/2022   ID:  Tiffany [name], Tiffany [name] Feb 10, 1951, MRN [id number]  PCP:  Inda Coke, PA  Cardiologist:   Dorris Carnes, MD   Pt referred by Gwynneth Munson for SOB       History of Present Illness: BRIUNNA [name] is a 72 y.o. female with a history of bipolar disorder, CKD, tardive dyskinesia who is referred for SOB   Pt follows with Gwynneth Munson   Has had 2 episodes of SOB   The last episode she was starting out a workout at the gym   Just started, says she wasn't doing much   Got VERY SOB No diaphoresis  Arms went weak, could not carry purse   Pt went to ER     labs, EKG negative    Did have difficulty findings words at the time Since then she has had no further spells  Went back to gym on 1/31  Working mainly with  weights      She denies severe CP  No palpitations   No PND    No LE edema   Current Meds  Medication Sig   Acetylcysteine (N-ACETYL CYSTEINE) 600 MG CAPS 1 capsule Orally Twice a day   atorvastatin (LIPITOR) 10 MG tablet Take 1 tablet (10 mg total) by mouth daily.   B Complex Vitamins (VITAMIN B COMPLEX) TABS 1 tablet Orally Once daily   betamethasone dipropionate 8.75 % cream 1 application Externally Twice a day   Cholecalciferol 50 MCG (2000 UT) TABS 1 tablet Orally Once a day for 30 day(s)   Golimumab (SIMPONI ARIA IV) Infusion every 8 weeks   lamoTRIgine (LAMICTAL) 100 MG tablet Take one tablet in the morning and take one tablet at lunch. (Patient taking differently: Take 100 mg by mouth daily.)   lamoTRIgine (LAMICTAL) 200 MG tablet Take 1 tablet (200 mg total) by mouth at bedtime.   levothyroxine (SYNTHROID) 100 MCG tablet TAKE 100 MCG MONDAY THRU SATURDAY AND 50 MCG ON SUNDAY   LORazepam (ATIVAN) 1 MG tablet TAKE 1 TABLET(1 MG) BY MOUTH TWICE DAILY AS NEEDED   metroNIDAZOLE (METROGEL) 0.75 % gel Apply to face 1-2 times daily   Omega-3 Fatty Acids (FISH OIL) 1200 MG CAPS Take 1 capsule by mouth in the morning and at bedtime.   Probiotic  Product (ALIGN) 4 MG CAPS Take 1 capsule by mouth daily in the afternoon.   sodium bicarbonate 650 MG tablet Take 650 mg by mouth 2 (two) times daily.     Allergies:   Aripiprazole, Lactose intolerance (gi), Methotrexate, Cefdinir, Etanercept, Exemestane, Fluoxetine, Methylprednisolone sodium succ, Epinephrine, and Nitrofurantoin   Past Medical History:  Diagnosis Date   Bipolar 1 disorder ([place])    Chronic diarrhea    loose stools twice a day on average for years   CKD stage G4/A1, GFR 15-29 and albumin creatinine ratio <30 mg/g ([place])    Depression 1987   Malignant neoplasm of overlapping sites of left breast in female, estrogen receptor positive ([place]) 03/14/2016   Dx in 09/2014, s/p bilateral mastectomies and ALND, 0/10 LN. 1.4 cm  Grade I invasive lobular, ER and PR +/Her--, Ki 67 <5% Tried anastrozole for one month, but developed suicidal idea   Osteoarthritis, knee 09/24/2019   Xray 09/2019   Parkinsonism    Drug induced   Polyposis of colon    Psoriatic arthritis ([place])    PTSD (post-traumatic stress disorder)    Secondary hyperparathyroidism ([place])  Tardive dyskinesia    Thyroid disease     Past Surgical History:  Procedure Laterality Date   ABDOMINAL HYSTERECTOMY  1987   CHOLECYSTECTOMY  1979   DILATION AND CURETTAGE OF UTERUS  1973   MASTECTOMY Bilateral 09/19/2014   TONSILLECTOMY  1970   URETERAL REIMPLANTION Bilateral 1974     Social History:  The patient  reports that she has never smoked. She has never used smokeless tobacco. She reports that she does not drink alcohol and does not use drugs.   Family History:  The patient's family history includes Breast cancer (age of onset: 18) in her sister; Colon polyps in her sister; Diabetes in her paternal grandfather; Lung cancer in her sister; Lymphoma (age of onset: 24) in her sister; Lymphoma (age of onset: 64) in her mother; Stroke in her maternal grandfather.    ROS:  Please see the history of present illness. All  other systems are reviewed and  Negative to the above problem except as noted.    PHYSICAL EXAM: VS:  BP 136/80 (BP Location: Right Arm, Patient Position: Sitting, Cuff Size: Normal)   Pulse (!) 55   Ht '5\' 4"'$  (1.626 m)   Wt 190 lb 9.6 oz (86.5 kg)   SpO2 98%   BMI 32.72 kg/m   GEN: Well nourished, well developed, in no acute distress  HEENT: normal  Neck: no JVD, carotid bruits,  Cardiac: RRR; no murmur  No LE edema  Respiratory:  clear to auscultation bilaterally,  GI: soft, nontender, nondistended, + BS  No hepatomegaly  MS: no deformity Moving all extremities   Skin: warm and dry, no rash Neuro:  A and O x 3   Speech  Mouth dry  Speech measured  Moves deliberatly  Psych: euthymic mood, full affect   EKG:  EKG is not ordered today.  On 04/03/22  SR    Lipid Panel    Component Value Date/Time   CHOL 182 12/10/2021 1044   TRIG 89.0 12/10/2021 1044   HDL 59.90 12/10/2021 1044   CHOLHDL 3 12/10/2021 1044   VLDL 17.8 12/10/2021 1044   LDLCALC 105 (H) 12/10/2021 1044      Wt Readings from Last 3 Encounters:  04/16/22 190 lb 9.6 oz (86.5 kg)  04/15/22 183 lb (83 kg)  04/10/22 189 lb (85.7 kg)      ASSESSMENT AND PLAN:  1  Dypsnea  Pt with 2 spells of SOB that were new   One was severe   None since  Assoicated with some arm numbness and weakness I am not convinced cardiac but I would recomm an echo to evaluate LV /RV systolic function    I would als sched for a monitor to evaluate for arrhythmia    Continue activities as tolerated   With word finding issues she is also being set up for MRI f brain.   2  HL On atorvastatin   LDL 105   FOllow for now   Follow up based on test results    Current medicines are reviewed at length with the patient today.  The patient does not have concerns regarding medicines.  Signed, Dorris Carnes, MD  04/16/2022 2:11 PM    [facility] [address], [place], [place]  [address] Phone: [phone]; Fax: [phone]

## 2022-04-18 ENCOUNTER — Encounter: Payer: Self-pay | Admitting: Internal Medicine

## 2022-04-18 NOTE — Progress Notes (Signed)
I connected with  Candiss Norse Holmer on 04/18/22 by a audio enabled telemedicine application and verified that I am speaking with the correct person using two identifiers.  Patient Location: Home  Provider Location: Office/Clinic  I discussed the limitations of evaluation and management by telemedicine. The patient expressed understanding and agreed to proceed.   Patient Medicare AWV questionnaire was completed by the patient on 04/12/22; I have confirmed that all information answered by patient is correct and no changes since this date.           Subjective:   Tiffany [name] is a 72 y.o. female who presents for Medicare Annual (Subsequent) preventive examination.  Review of Systems     Cardiac Risk Factors include: advanced age (>21mn, >>26women);obesity (BMI >30kg/m2)     Objective:    Today's Vitals   04/15/22 1333  Weight: 183 lb (83 kg)   Body mass index is 31.41 kg/m.     04/15/2022    1:41 PM 04/03/2022    2:15 PM 11/26/2021    6:15 PM 10/29/2021   10:40 AM 04/02/2021    1:04 PM 12/21/2020    3:11 PM 09/05/2020    2:39 PM  Advanced Directives  Does Patient Have a Medical Advance Directive? Yes No Yes Yes Yes Yes Yes  Type of [place]amed[place]of [place] will  Living will Living will Healthcare Power of [place] will  Does patient want to make changes to medical advance directive? No - Patient declined        Copy of [place] Chart? Yes - validated most recent copy scanned in chart (See row information)    Yes - validated most recent copy scanned in chart (See row information)  No - copy requested  Would patient like information on creating a medical advance directive? No - Patient declined No - Patient declined         Current Medications (verified) Outpatient Encounter Medications as of 04/15/2022  Medication Sig   Acetylcysteine (N-ACETYL CYSTEINE)  600 MG CAPS 1 capsule Orally Twice a day   atorvastatin (LIPITOR) 10 MG tablet Take 1 tablet (10 mg total) by mouth daily.   B Complex Vitamins (VITAMIN B COMPLEX) TABS 1 tablet Orally Once daily   betamethasone dipropionate 0AB-123456789% cream 1 application Externally Twice a day   Cholecalciferol 50 MCG (2000 UT) TABS 1 tablet Orally Once a day for 30 day(s)   Golimumab (SIMPONI ARIA IV) Infusion every 8 weeks   lamoTRIgine (LAMICTAL) 100 MG tablet Take one tablet in the morning and take one tablet at lunch. (Patient taking differently: Take 100 mg by mouth daily.)   lamoTRIgine (LAMICTAL) 200 MG tablet Take 1 tablet (200 mg total) by mouth at bedtime.   levothyroxine (SYNTHROID) 100 MCG tablet TAKE 100 MCG MONDAY THRU SATURDAY AND 50 MCG ON SUNDAY   LORazepam (ATIVAN) 1 MG tablet TAKE 1 TABLET(1 MG) BY MOUTH TWICE DAILY AS NEEDED   metroNIDAZOLE (METROGEL) 0.75 % gel Apply to face 1-2 times daily   Omega-3 Fatty Acids (FISH OIL) 1200 MG CAPS Take 1 capsule by mouth in the morning and at bedtime.   Probiotic Product (ALIGN) 4 MG CAPS Take 1 capsule by mouth daily in the afternoon.   sodium bicarbonate 650 MG tablet Take 650 mg by mouth 2 (two) times daily.   [DISCONTINUED] Azelaic Acid 15 % gel Apply topically as needed.  No facility-administered encounter medications on file as of 04/15/2022.    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 1.4 cm   Grade I invasive lobular, ER and PR +/Her--, Ki 67 <5% Tried anastrozole for one month, but developed suicidal idea   Osteoarthritis, knee 09/24/2019   Xray 09/2019   Parkinsonism    Drug induced   Polyposis of colon    Psoriatic arthritis ([place])    PTSD (post-traumatic stress disorder)    Secondary hyperparathyroidism ([place])    Tardive dyskinesia    Thyroid disease    Past Surgical History:  Procedure Laterality Date   ABDOMINAL HYSTERECTOMY  [place]   MASTECTOMY Bilateral 09/19/2014   TONSILLECTOMY  1970   URETERAL REIMPLANTION Bilateral 1974   Family History  Problem Relation Age of Onset   Lymphoma Mother 88   Lymphoma Sister 36   Breast cancer Sister 83   Colon polyps Sister    Lung cancer Sister        former smoker   Stroke Maternal Grandfather    Diabetes Paternal Grandfather    Colon cancer Neg Hx    Esophageal cancer Neg Hx    Stomach cancer Neg Hx    Rectal cancer Neg Hx    Social History   Socioeconomic History   Marital status: Married    Spouse name: [name]   Number of children: 3   Years of education: Not on file   Highest education level: Not on file  Occupational History   Occupation: retired  Tobacco Use   Smoking status: Never   Smokeless tobacco: Never  Vaping Use   Vaping Use: Never used  Substance and Sexual Activity   Alcohol use: Never   Drug use: Never   Sexual activity: Not Currently  Other Topics Concern   Not on file  Social History Narrative   Moved to area from [place] 08/2018   Lives one story home   Right handed.   Social Determinants of Health   Financial Resource Strain: Low Risk  (04/15/2022)   Overall Financial Resource Strain (CARDIA)    Difficulty of Paying Living Expenses: Not hard at all  Food Insecurity: No Food Insecurity (04/15/2022)   Hunger Vital Sign    Worried About Running Out of Food in the Last Year: Never true    Ran Out of Food in the Last  Year: Never true  Transportation Needs: No Transportation Needs (04/15/2022)   PRAPARE - Hydrologist (Medical): No    Lack of Transportation (Non-Medical): No  Physical Activity: Sufficiently Active (04/15/2022)   Exercise Vital Sign    Days of Exercise per Week: 5 days    Minutes of Exercise per Session: 60 min  Stress: No Stress Concern Present (04/02/2021)   [place]    Feeling of Stress : Not at all  Social Connections: Moderately Integrated (04/15/2022)   Social Connection and Isolation Panel [NHANES]    Frequency of Communication with Friends and Family: More than three times a week    Frequency of Social Gatherings with Friends and Family: More than three times a week    Attends Religious Services: More than 4 times per year    Active Member of [employer] or Organizations: No    Attends Archivist Meetings: Never    Marital Status: Married    Tobacco Counseling Counseling given: Not Answered   Clinical Intake:  Pre-visit preparation completed: Yes  Pain : No/denies pain     BMI - recorded: 31.41 Nutritional Status: BMI > 30  Obese Nutritional Risks: Other (Comment) Diabetes: No  How often do you need to have someone help you when you read instructions, pamphlets, or other written materials from your doctor or pharmacy?: 1 - Never  Diabetic?no  Interpreter Needed?: No  Information entered by :: Charlott Rakes, LPN   Activities of Daily Living    04/12/2022    4:38 PM  In your present state of health, do you have any difficulty performing the following activities:  Hearing? 0  Vision? 0  Difficulty concentrating or making decisions? 1  Walking or climbing stairs? 1  Dressing or bathing? 0  Doing errands, shopping? 1  Preparing Food and eating ? N  Using the Toilet? N  In the past six months, have you accidently leaked urine? N  Do you have problems with loss  of bowel control? N  Managing your Medications? Y  Managing your Finances? Y  Housekeeping or managing your Housekeeping? N    Patient Care Team: Inda Coke, [place] as PCP - General (Physician Assistant) Mozingo, Berdie Ogren, NP as Nurse Practitioner (Psychiatry) Thornton Park, MD as Consulting Physician (Gastroenterology) Star Age, MD as Consulting Physician (Neurology) Rheumatology, [facility] as Consulting Physician (Rheumatology) Cameron Sprang, MD as Consulting Physician (Neurology) Associates, [facility] as Consulting Physician (Nephrology) Cameron Sprang, MD as Consulting Physician (Neurology) [name], PhD (Psychiatry)  Indicate any recent Medical Services you may have received from other than [facility] providers in the past year (date may be approximate).     Assessment:   This is a routine wellness examination for [place].  Hearing/Vision screen Hearing Screening - Comments:: Pt denies any hearing issues  Vision Screening - Comments:: Pt follows up with [facility] for annual eye exams   Dietary issues and exercise activities discussed: Current Exercise Habits: Structured exercise class, Type of exercise: Other - see comments (work Physiological scientist), Time (Minutes): 60, Frequency (Times/Week): 5, Weekly Exercise (Minutes/Week): 300   Goals Addressed             This Visit's Progress    Patient Stated       Like to get more strength and balance       Depression Screen    04/15/2022    1:40 PM 04/10/2022   11:06 AM 11/30/2021   10:09 AM 11/06/2021    9:02 AM 09/28/2021    8:23 AM 07/30/2021   10:58 AM 07/02/2021   10:21 AM  PHQ 2/9 Scores  PHQ - 2 Score 0 1 0 0  2 1  PHQ- 9 Score 0 3    11 6     $ Information is confidential and restricted. Go to Review Flowsheets to unlock data.    Fall Risk    04/15/2022    1:42 PM 04/12/2022    4:38  PM 04/10/2022   11:02 AM 04/02/2021    1:04 PM 03/09/2021   11:04 AM  Fall Risk   Falls in the  past year? 0 0 0 0 0  Number falls in past yr: 0  0 0   Injury with Fall? 0  0 0   Risk for fall due to : Impaired vision   Impaired vision;Impaired balance/gait   Risk for fall due to: Comment    working on with PT   Follow up Falls prevention discussed  Falls evaluation completed Falls prevention discussed     FALL RISK PREVENTION PERTAINING TO THE HOME:  Any stairs in or around the home? Yes  If so, are there any without handrails? No  Home free of loose throw rugs in walkways, pet beds, electrical cords, etc? Yes  Adequate lighting in your home to reduce risk of falls? Yes   ASSISTIVE DEVICES UTILIZED TO PREVENT FALLS:  Life alert? No  Use of a cane, walker or w/c? No  Grab bars in the bathroom? Yes  Shower chair or bench in shower? Yes  Elevated toilet seat or a handicapped toilet? Yes   TIMED UP AND GO:  Was the test performed? No .  Cognitive Function:    06/07/2019    2:00 PM  MMSE - Mini Mental State Exam  Orientation to time 4  Orientation to Place 5  Registration 3  Attention/ Calculation 5  Recall 3  Language- name 2 objects 2  Language- repeat 1  Language- follow 3 step command 3  Language- read & follow direction 1  Write a sentence 1  Copy design 1  Total score 29        04/15/2022    1:44 PM 04/02/2021    1:06 PM 03/16/2020    2:54 PM  6CIT Screen  What Year? 0 points 0 points 0 points  What month? 0 points 0 points 0 points  What time? 0 points 0 points   Count back from 20 0 points 0 points 0 points  Months in reverse 4 points 0 points 0 points  Repeat phrase 0 points 0 points 0 points  Total Score 4 points 0 points     Immunizations Immunization History  Administered Date(s) Administered   COVID-19, mRNA, vaccine(Comirnaty)12 years and older 12/17/2021   Influenza Inj Mdck Quad Pf 01/05/2019   Influenza, High Dose Seasonal PF 12/13/2014, 01/22/2016, 11/25/2016, 11/18/2017, 11/29/2020, 12/05/2021   Influenza, Seasonal, Injecte,  Preservative Fre 01/22/2016   Influenza-Unspecified 12/10/2019   PFIZER(Purple Top)SARS-COV-2 Vaccination 07/09/2019, 08/02/2019, 11/09/2019   PPD Test 02/27/2016   Pfizer Covid-19 Vaccine Bivalent Booster 4yr & up 01/23/2021   Pneumococcal Conjugate-13 12/13/2014   Pneumococcal Polysaccharide-23 02/07/2016   Respiratory Syncytial Virus Vaccine,Recomb Aduvanted(Arexvy) 12/26/2021   Tdap 05/26/2015    TDAP status: Up to date  Flu Vaccine status: Up to date  Pneumococcal vaccine status: Up to date  Covid-19 vaccine status: Completed vaccines  Qualifies for Shingles Vaccine? Yes   Zostavax completed No   Shingrix Completed?: No.    Education has been provided regarding the importance of this vaccine. Patient has been advised to call insurance company to determine out of pocket expense if they have not yet received this vaccine. Advised may also receive vaccine at local pharmacy or Health Dept. Verbalized acceptance and understanding.  Screening Tests Health Maintenance  Topic Date Due   Zoster Vaccines- Shingrix (1 of 2) 07/09/2022 (Originally 09/24/1969)   DEXA SCAN  08/10/2022 (Originally 08/25/2021)  COVID-19 Vaccine (6 - 2023-24 season) 04/11/2023 (Originally 02/11/2022)   Medicare Annual Wellness (AWV)  04/16/2023   DTaP/Tdap/Td (2 - Td or Tdap) 05/25/2025   COLONOSCOPY (Pts 45-34yr Insurance coverage will need to be confirmed)  06/12/2031   Pneumonia Vaccine 74 Years old  Completed   INFLUENZA VACCINE  Completed   HPV VACCINES  Aged Out   MAMMOGRAM  Discontinued   Hepatitis C Screening  Discontinued    Health Maintenance  There are no preventive care reminders to display for this patient.   Colorectal cancer screening: No longer required.   Mammogram status: No longer required due to pt .  Bone Density status: Completed 08/26/19. Results reflect: Bone density results: OSTEOPENIA. Repeat every 2 years.  Additional Screening:  Hepatitis C Screening:  Completed  07/02/21  Vision Screening: Recommended annual ophthalmology exams for early detection of glaucoma and other disorders of the eye. Is the patient up to date with their annual eye exam?  Yes  Who is the provider or what is the name of the office [facility] which the patient [facility] [facility]  If pt is not established with a provider, would they like to be referred to a provider to establish care? No .   Dental Screening: Recommended annual dental exams for proper oral hygiene  Community Resource Referral / Chronic Care Management: CRR required this visit?  No   CCM required this visit?  No      Plan:     I have personally reviewed and noted the following in the patient's chart:   Medical and social history Use of alcohol, tobacco or illicit drugs  Current medications and supplements including opioid prescriptions. Patient is not currently taking opioid prescriptions. Functional ability and status Nutritional status Physical activity Advanced directives List of other physicians Hospitalizations, surgeries, and ER visits in previous 12 months Vitals Screenings to include cognitive, depression, and falls Referrals and appointments  In addition, I have reviewed and discussed with patient certain preventive protocols, quality metrics, and best practice recommendations. A written personalized care plan for preventive services as well as general preventive health recommendations were provided to patient.     TWillette Brace LPN   [id number]  Nurse Notes: none

## 2022-04-19 ENCOUNTER — Telehealth: Payer: Self-pay | Admitting: Internal Medicine

## 2022-04-19 NOTE — Telephone Encounter (Signed)
Returned call patient should wait until have her MRI on 2/17 to apply her monitor. Confirmed with Echo patient is ok to have monitor on during test.

## 2022-04-19 NOTE — Telephone Encounter (Signed)
Patient's husband is calling stating they received the heart monitor in the mail. They are wanting to know if they should go ahead and place the heart monitor and move the upcoming MRI and Echo out for two weeks. They are unsure of which is more important due to waiting until the MRI and echo have been completed to wear the heart monitor causing it not to be placed until 02/29. Please advise.

## 2022-04-23 ENCOUNTER — Other Ambulatory Visit: Payer: Self-pay | Admitting: Physician Assistant

## 2022-04-23 DIAGNOSIS — R41844 Frontal lobe and executive function deficit: Secondary | ICD-10-CM

## 2022-04-23 DIAGNOSIS — R531 Weakness: Secondary | ICD-10-CM

## 2022-04-24 ENCOUNTER — Ambulatory Visit: Payer: Medicare Other | Admitting: Adult Health

## 2022-04-25 ENCOUNTER — Encounter: Payer: Self-pay | Admitting: Family

## 2022-04-25 ENCOUNTER — Ambulatory Visit (INDEPENDENT_AMBULATORY_CARE_PROVIDER_SITE_OTHER): Payer: Medicare Other | Admitting: Family

## 2022-04-25 VITALS — BP 112/77 | HR 75 | Temp 97.1°F | Ht 64.0 in | Wt 187.1 lb

## 2022-04-25 DIAGNOSIS — J020 Streptococcal pharyngitis: Secondary | ICD-10-CM | POA: Diagnosis not present

## 2022-04-25 LAB — POCT INFLUENZA A/B
Influenza A, POC: NEGATIVE
Influenza B, POC: NEGATIVE

## 2022-04-25 LAB — POC COVID19 BINAXNOW: SARS Coronavirus 2 Ag: NEGATIVE

## 2022-04-25 LAB — POCT RAPID STREP A (OFFICE): Rapid Strep A Screen: POSITIVE — AB

## 2022-04-25 MED ORDER — AMOXICILLIN 500 MG PO CAPS: 500.0000 mg | ORAL_CAPSULE | Freq: Two times a day (BID) | ORAL | 0 refills | Status: AC

## 2022-04-25 NOTE — Progress Notes (Signed)
Patient ID: Tiffany [name], female    DOB: 1950-08-09, 72 y.o.   MRN: [id number]  Chief Complaint  Patient presents with   Sinus Problem    HPI:      Sinus sx:  Pt c/o slight Nasal congestion, sore throat and bilateral ear pain for about 2 days. Has tried drinking water and rest. Reports having strep throat back in Dec, treated by me and has been w/o sx until now. Denies fever or cough.     Assessment & Plan:  1. Strep throat - sending AMOX again, last infection in Dec. pt aware of SE, tolerated well. Advised pt to take Tylenol 1,050m tid prn for sore throat pain or fever. Gargle with warm salt water several tid. OK to use OTC Chloraseptic spray and/or throat lozenges prn. Drink plenty of water.   - POCT rapid strep A - POC COVID-19 - POCT Influenza A/B - amoxicillin (AMOXIL) 500 MG capsule; Take 1 capsule (500 mg total) by mouth 2 (two) times daily for 10 days.  Dispense: 20 capsule; Refill: 0  Subjective:    Outpatient Medications Prior to Visit  Medication Sig Dispense Refill   Acetylcysteine (N-ACETYL CYSTEINE) 600 MG CAPS 1 capsule Orally Twice a day     atorvastatin (LIPITOR) 10 MG tablet Take 1 tablet (10 mg total) by mouth daily. 90 tablet 1   B Complex Vitamins (VITAMIN B COMPLEX) TABS 1 tablet Orally Once daily     betamethasone dipropionate 0AB-123456789% cream 1 application Externally Twice a day     Cholecalciferol 50 MCG (2000 UT) TABS 1 tablet Orally Once a day for 30 day(s)     Golimumab (SIMPONI ARIA IV) Infusion every 8 weeks     lamoTRIgine (LAMICTAL) 100 MG tablet Take one tablet in the morning and take one tablet at lunch. (Patient taking differently: Take 100 mg by mouth daily.) 180 tablet 1   lamoTRIgine (LAMICTAL) 200 MG tablet Take 1 tablet (200 mg total) by mouth at bedtime. 90 tablet 3   levothyroxine (SYNTHROID) 100 MCG tablet TAKE 100 MCG MONDAY THRU SATURDAY AND 50 MCG ON SUNDAY     LORazepam (ATIVAN) 1 MG tablet TAKE 1 TABLET(1 MG) BY MOUTH TWICE DAILY AS  NEEDED 60 tablet 2   metroNIDAZOLE (METROGEL) 0.75 % gel Apply to face 1-2 times daily 45 g 1   Omega-3 Fatty Acids (FISH OIL) 1200 MG CAPS Take 1 capsule by mouth in the morning and at bedtime.     Probiotic Product (ALIGN) 4 MG CAPS Take 1 capsule by mouth daily in the afternoon.     sodium bicarbonate 650 MG tablet Take 650 mg by mouth 2 (two) times daily.     No facility-administered medications prior to visit.   Past Medical History:  Diagnosis Date   Bipolar 1 disorder ([place]    Chronic diarrhea    loose stools twice a day on average for years   CKD stage G4/A1, GFR 15-29 and albumin creatinine ratio <30 mg/g ([facility])    Depression 1987   Malignant ne[place] of overlapping sites of left breast in [place], [place] positive [place]14[place]   Dx in 09/2014, s/p bilateral mastectomies and ALND, 0/10 LN. 1.4 cm  Grade I invasive lobular, ER and PR +/Her--, Ki 67 <5% Tried anastrozole for one month, but developed suicidal idea   Osteoarthritis, knee 09/24/2019   Xray 09/2019   Parkinsonism    Drug induced   Polyposis of colon    Psoriatic  arthritis ([place])    PTSD (post-traumatic stress disorder)    Secondary hyperparathyroidism ([place])    Tardive dyskinesia    Thyroid disease    Past Surgical History:  Procedure Laterality Date   ABDOMINAL HYSTERECTOMY  [place]   MASTECTOMY Bilateral 09/19/2014   TONSILLECTOMY  1970   URETERAL REIMPLANTION Bilateral 1974   Allergies  Allergen Reactions   Aripiprazole Other (See Comments)    Parkinsonism      Lactose Intolerance (Gi) Diarrhea   Methotrexate Other (See Comments)    Hair loss, severe stomatitis     Cefdinir Diarrhea    Other reaction(s): Diarrhea Yeast infection and fever; negative c diff   Etanercept Other (See Comments)    Headaches     Exemestane Other (See Comments)    Suicidal thoughts with medication     Fluoxetine Other (See Comments)     Parkinsonism   Methylprednisolone Sodium Succ Other (See Comments)    Agitated mania   Epinephrine Palpitations    tachycardia    Nitrofurantoin Other (See Comments)     GI upset from 2012      Objective:    Physical Exam Vitals and nursing note reviewed.  Constitutional:      Appearance: Normal appearance.  HENT:     Mouth/Throat:     Mouth: Mucous membranes are moist.     Pharynx: Posterior oropharyngeal erythema present. No pharyngeal swelling, oropharyngeal exudate or uvula swelling.     Tonsils: No tonsillar exudate or tonsillar abscesses. 2+ on the right. 2+ on the left.  Cardiovascular:     Rate and Rhythm: Normal rate and regular rhythm.  Pulmonary:     Effort: Pulmonary effort is normal.     Breath sounds: Normal breath sounds.  Musculoskeletal:        General: Normal range of motion.  Lymphadenopathy:     Head:     Right side of head: No preauricular or posterior auricular adenopathy.     Left side of head: No preauricular or posterior auricular adenopathy.     Cervical: Cervical adenopathy present.     Right cervical: Superficial cervical adenopathy present.     Left cervical: Superficial cervical adenopathy present.     Upper Body:     Right upper body: No supraclavicular adenopathy.     Left upper body: No supraclavicular adenopathy.  Skin:    General: Skin is warm and dry.  Neurological:     Mental Status: She is alert.  Psychiatric:        Mood and Affect: Mood normal.        Behavior: Behavior normal.    BP 112/77 (BP Location: Left Arm, Patient Position: Sitting, Cuff Size: Large)   Pulse 75   Temp (!) 97.1 F (36.2 C) (Temporal)   Ht 5' 4"$  (1.626 m)   Wt 187 lb 2 oz (84.9 kg)   SpO2 93%   BMI 32.12 kg/m  Wt Readings from Last 3 Encounters:  04/25/22 187 lb 2 oz (84.9 kg)  04/16/22 190 lb 9.6 oz (86.5 kg)  04/15/22 183 lb (83 kg)      Jeanie Sewer, NP

## 2022-04-26 ENCOUNTER — Ambulatory Visit: Payer: Medicare Other | Admitting: Adult Health

## 2022-04-27 ENCOUNTER — Other Ambulatory Visit: Payer: Medicare Other

## 2022-04-29 ENCOUNTER — Ambulatory Visit (INDEPENDENT_AMBULATORY_CARE_PROVIDER_SITE_OTHER): Payer: Medicare Other | Admitting: Adult Health

## 2022-04-29 ENCOUNTER — Encounter: Payer: Self-pay | Admitting: Adult Health

## 2022-04-29 DIAGNOSIS — F99 Mental disorder, not otherwise specified: Secondary | ICD-10-CM

## 2022-04-29 DIAGNOSIS — F319 Bipolar disorder, unspecified: Secondary | ICD-10-CM | POA: Diagnosis not present

## 2022-04-29 DIAGNOSIS — F5105 Insomnia due to other mental disorder: Secondary | ICD-10-CM

## 2022-04-29 DIAGNOSIS — F4310 Post-traumatic stress disorder, unspecified: Secondary | ICD-10-CM | POA: Diagnosis not present

## 2022-04-29 DIAGNOSIS — F411 Generalized anxiety disorder: Secondary | ICD-10-CM | POA: Diagnosis not present

## 2022-04-29 NOTE — Progress Notes (Signed)
Tiffany [name] [id number] 1950/10/04 72 y.o.  Subjective:   Patient ID:  Tiffany [name] is a 72 y.o. (DOB 11-07-1950) female.  Chief Complaint: No chief complaint on file.   HPI Tiffany [name] presents to the office today for follow-up of  PTSD, insomnia, GAD and  BPD 1.  Collateral reviewed since last visit - ED visit.  Accompanied by husband.  Recovering from Strep throat - 5th day on antibiotics  Describes mood today as "not so good". Pleasant. Reports tearfulness. Mood symptoms - reports increased anxiety - "day and night". Using a 1/4 of a Lorazapm 28m tablet as needed. Reports increased teafulness. Reports depression. Reports being more sensitive to noises Increased worry - "about the future". Reports worry, rumination, and overthinking. Mood is variable. Worried about son's depression. Worried about her daughters future. Missing her sister. Thinking about "lost" relationships. Had thoughts of suicide a week ago - "what I would do" - lasted an hour or two and then she felt better. Stating "I'm hanging in there". Feels like medications are helpful. Family supportive and doing well. Improved interest and motivation. Taking medications as prescribed. Working with therapist - [employer] levels mostly lower. Feels like she has been off balance - fluid. Active, does not have a regular exercise routine. Enjoys some usual interests and activities. Getting out more. Married. Lives with husband their daughter. Talking to family and friends.  Appetite improved. Weight loss - 6 pounds. Sleep is variable. Averages 7 hours of broken sleep. Focus and concentration difficulties - "a little bit". Completing tasks. Managing some aspects of household. Retired.  Denies SI or HI.  Denies AH or VH  Denies self harm. Denies substance use.  Endocrinologist reduced Levothyroxine to 510m only on Sunday.  Previous medications: Celexa, Zyprexa, Tegretol, Depakote, Serzone, Topamax, Seroquel, Effexor,  Lexapro, Desipramine, Neurontin, Abilify, Geodon, Propanolol, Cymbalta, Cogentin, Trihexyphenadyl, Sinmmet, Provigil, Selegiline, Requip, Amantadine, Prozac, Mirapex, Azilect, Metoclopramide, Baclofen, Artane, Namenda, Latuda.    GA[facility]restffice Visit from 04/25/2022 in [facility] from 04/10/2022 in [facility] from 02/16/2021 in [facility] from 05/14/2019 in [facility] G[facility]-7 Score 9 17 13 21      $ Mi[facility]ginffice [facility]  Total Score (max 30 points ) 29      PHQ2-9    Fl[facility]omeisit from 04/25/2022 in [facility] 04/15/2022 in [facility] from 04/10/2022 in [facility] 11/30/2021 in [facility] [place] 11/06/2021 [facility] 0 0 1 0 0  PHQ-9 Total Score 6 0 3 -- --      Flowsheet Row Clinical Support from 04/15/2022 in [facility] from 04/03/2022 [facility] from 11/26/2021 [facility] at DrWeidmano Risk No Risk No Risk        Review of Systems:  Review of Systems  Musculoskeletal:  Negative for gait problem.  Neurological:  Negative for tremors.  Psychiatric/Behavioral:         Please refer to HPI    Medications: I have reviewed the patient's current medications.  Current Outpatient Medications  Medication Sig Dispense Refill   Acetylcysteine (N-ACETYL CYSTEINE) 600 MG CAPS 1 capsule Orally Twice a day     amoxicillin (AMOXIL) 500 MG capsule Take 1 capsule (500 mg total) by  mouth 2 (two) times daily for 10 days. 20 capsule 0   atorvastatin (LIPITOR) 10 MG tablet Take 1 tablet (10 mg total) by mouth daily. 90 tablet 1   B Complex Vitamins (VITAMIN B COMPLEX) TABS 1 tablet Orally Once daily     betamethasone dipropionate [id number] % cream 1 application Externally Twice a day     Cholecalciferol 50 MCG (2000 UT) TABS 1 tablet Orally Once a day for 30 day(s)     Golimumab (SIMPONI ARIA IV) Infusion every 8 weeks     lamoTRIgine (LAMICTAL) 100 MG tablet Take one tablet in the morning and take one tablet at lunch. (Patient taking differently: Take 100 mg by mouth daily.) 180 tablet 1   lamoTRIgine (LAMICTAL) 200 MG tablet Take 1 tablet (200 mg total) by mouth at bedtime. 90 tablet 3   levothyroxine (SYNTHROID) 100 MCG tablet TAKE 100 MCG MONDAY THRU SATURDAY AND 50 MCG ON SUNDAY     LORazepam (ATIVAN) 1 MG tablet TAKE 1 TABLET(1 MG) BY MOUTH TWICE DAILY AS NEEDED 60 tablet 2   metroNIDAZOLE (METROGEL) 0.75 % gel Apply to face 1-2 times daily 45 g 1   Omega-3 Fatty Acids (FISH OIL) 1200 MG CAPS Take 1 capsule by mouth in the morning and at bedtime.     Probiotic Product (ALIGN) 4 MG CAPS Take 1 capsule by mouth daily in the afternoon.     sodium bicarbonate 650 MG tablet Take 650 mg by mouth 2 (two) times daily.     No current facility-administered medications for this visit.    Medication Side Effects: None  Allergies:  Allergies  Allergen Reactions   Aripiprazole Other (See Comments)    Parkinsonism      Lactose Intolerance (Gi) Diarrhea   Methotrexate Other (See Comments)    Hair loss, severe stomatitis     Cefdinir Diarrhea    Other reaction(s): Diarrhea Yeast infection and fever; negative c diff   Etanercept Other (See Comments)    Headaches     Exemestane Other (See Comments)    Suicidal thoughts with medication     Fluoxetine Other (See Comments)    Parkinsonism   Methylprednisolone Sodium Succ Other (See Comments)    Agitated mania    Epinephrine Palpitations    tachycardia    Nitrofurantoin Other (See Comments)     GI upset from 2012    Past Medical History:  Diagnosis Date   Bipolar 1 disorder ([place])    Chronic diarrhea    loose stools twice a day on average for years   CKD stage G4/A1, 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 1.4 cm  Grade I invasive lobular, ER and PR +/Her--, Ki 67 <5% Tried anastrozole for one month, but developed suicidal idea   Osteoarthritis, knee 09/24/2019   Xray 09/2019   Parkinsonism    Drug induced   Polyposis of colon    Psoriatic arthritis ([place])    PTSD (post-traumatic stress disorder)    Secondary hyperparathyroidism ([place])    Tardive dyskinesia    Thyroid disease  Past Medical History, Surgical history, Social history, and Family history were reviewed and updated as appropriate.   Please see review of systems for further details on the patient's review from today.   Objective:   Physical Exam:  There were no vitals taken for this visit.  Physical Exam Constitutional:      General: She is not in acute distress. Musculoskeletal:        General: No deformity.  Neurological:     Mental Status: She is alert and oriented to person, place, and time.     Coordination: Coordination normal.  Psychiatric:        Attention and Perception: Attention and perception normal. She does not perceive auditory or visual hallucinations.        Mood and Affect: Mood normal. Mood is not anxious or depressed. Affect is not labile, blunt, angry or inappropriate.        Speech: Speech normal.        Behavior: Behavior normal.        Thought Content: Thought content normal. Thought content is not paranoid or delusional. Thought content does not include homicidal or suicidal ideation.  Thought content does not include homicidal or suicidal plan.        Cognition and Memory: Cognition and memory normal.        Judgment: Judgment normal.     Comments: Insight intact     Lab Review:     Component Value Date/Time   NA 141 04/03/2022 1425   NA 142 10/17/2021 0000   K 4.0 04/03/2022 1425   CL 108 04/03/2022 1425   CO2 21 (L) 04/03/2022 1425   GLUCOSE 90 04/03/2022 1425   BUN 28 (H) 04/03/2022 1425   BUN 30 (A) 10/17/2021 0000   CREATININE 2.33 (H) 04/03/2022 1425   CREATININE 2.29 (H) 05/22/2021 1044   CALCIUM 9.7 04/03/2022 1425   PROT 7.0 12/10/2021 1044   ALBUMIN 4.4 12/10/2021 1044   AST 14 12/10/2021 1044   ALT 12 12/10/2021 1044   ALKPHOS 122 (H) 12/10/2021 1044   BILITOT 0.7 12/10/2021 1044   GFRNONAA 22 (L) 04/03/2022 1425   GFRAA 25 (L) 09/13/2019 1243       Component Value Date/Time   WBC 9.1 04/03/2022 1425   RBC 4.19 04/03/2022 1425   HGB 12.8 04/03/2022 1425   HCT 38.9 04/03/2022 1425   PLT 203 04/03/2022 1425   MCV 92.8 04/03/2022 1425   MCH 30.5 04/03/2022 1425   MCHC 32.9 04/03/2022 1425   RDW 13.1 04/03/2022 1425   LYMPHSABS 3.2 07/02/2021 1048   MONOABS 0.7 07/02/2021 1048   EOSABS 0.2 07/02/2021 1048   BASOSABS 0.1 07/02/2021 1048    Lithium Lvl  Date Value Ref Range Status  05/22/2021 0.7 0.6 - 1.2 mmol/L Final     No results found for: "PHENYTOIN", "PHENOBARB", "VALPROATE", "CBMZ"   .res Assessment: Plan:    Plan:  Lorazepam 61m BID for anxiety - may take one tablet extra for severe anxiety symptoms. Taking one at night routinely. Lamictal 2074mhs Decrease Lamictal 10019mo 43m12mery morning.  Reports a correlation of increased GFR with Lamictal  Consider low dose of Gabapentin.  NAC tablets BID - for obsessive thoughts, worry, and rumination - unable to take SSRI's  RTC weekly  Seeing Endocrinologist on 10/23.  Counseled patient regarding potential benefits, risks, and side effects of Lamictal to include  potential risk of Stevens-Johnson syndrome. Advised patient to stop taking Lamictal and contact office  immediately if rash develops and to seek urgent medical attention if rash is severe and/or spreading quickly.  Discussed potential benefits, risk, and side effects of benzodiazepines to include potential risk of tolerance and dependence, as well as possible drowsiness. Advised patient not to drive if experiencing drowsiness and to take lowest possible effective dose to minimize risk of dependence and tolerance.   There are no diagnoses linked to this encounter.   Please see After Visit Summary for patient specific instructions.  Future Appointments  Date Time Provider [place]  05/08/2022 12:10 PM Shamleffer, Melanie Crazier, MD [facility] None  05/09/2022  2:45 PM [facility] ECHO 3 [facility]  05/10/2022 12:10 PM GI-315 MR 1 GI-315MRI GI-[facility] [facility]  05/10/2022 12:50 PM GI-315 MR 1 GI-315MRI GI-315 [facility] [facility]  05/10/2022  1:20 PM GI-315 MR 1 GI-315MRI GI-315 [facility] [facility]  04/24/2023  2:00 PM [facility]    No orders of the defined types were placed in this encounter.   -------------------------------

## 2022-05-01 ENCOUNTER — Encounter: Payer: Self-pay | Admitting: Gastroenterology

## 2022-05-01 ENCOUNTER — Other Ambulatory Visit: Payer: Self-pay | Admitting: Physician Assistant

## 2022-05-01 DIAGNOSIS — R531 Weakness: Secondary | ICD-10-CM

## 2022-05-01 DIAGNOSIS — R41844 Frontal lobe and executive function deficit: Secondary | ICD-10-CM

## 2022-05-03 DIAGNOSIS — R0600 Dyspnea, unspecified: Secondary | ICD-10-CM | POA: Diagnosis not present

## 2022-05-03 DIAGNOSIS — R001 Bradycardia, unspecified: Secondary | ICD-10-CM

## 2022-05-06 ENCOUNTER — Encounter: Payer: Self-pay | Admitting: Physician Assistant

## 2022-05-06 ENCOUNTER — Telehealth: Payer: Self-pay

## 2022-05-06 ENCOUNTER — Ambulatory Visit (INDEPENDENT_AMBULATORY_CARE_PROVIDER_SITE_OTHER): Payer: Medicare Other | Admitting: Physician Assistant

## 2022-05-06 VITALS — BP 130/80 | HR 66 | Temp 98.0°F | Ht 64.0 in | Wt 188.5 lb

## 2022-05-06 DIAGNOSIS — R531 Weakness: Secondary | ICD-10-CM | POA: Diagnosis not present

## 2022-05-06 NOTE — Telephone Encounter (Signed)
Patient is calling in asking if we can work her in to see Dr.Aquino. States she was seen last year for confusion but symptoms have worsened, has had an increase in weakness as well. Is scheduled to have 3 MRI's per PCP but suggested she follow up with [place].

## 2022-05-06 NOTE — Progress Notes (Signed)
Tiffany [name] is a 72 y.o. female here for a follow up of a pre-existing problem.  History of Present Illness:   Chief Complaint  Patient presents with   Discuss options for imaging    HPI  Memory concerns and follow-up on episode of weakness We placed orders for MRI and she is still unsure if she can withstand having this procedure done Denies worsening episodes of weakness or confusion since last seeing [place] She has on ZIO patch today and has echo scheduled for later this week She continues to work closely with specialists including endocrinology, psychiatry and nephrology She states that her kidney function is stable, if not slightly improved She is pleased with these visits     04/10/22 visit with me Patient is here with husband.   She reports that she has had two episodes of sudden onset SOB with exertion. Both times she was exercising. The most recent time this occurred, she went to the ER. She had also experienced bilateral arm numbness/tingling, but now she reports that is unsure if it was unilateral. She had some word-finding issues with this episode as well.    Hospital BMP showed CO2 at 21, BUN at 28, Creatinine at 2.33, GFR at 22, otherwise normal. D-dimer negative. EKG normal. CXR  showed no acute abnormalities. Discharged same-day in stable condition and notified to return if worrisome signs and symptoms recur. She took Ativan afterward, which helped her anxiety. Denies feeling angina symptoms at the time or lingering symptoms afterward.   She has another PT session scheduled for this afternoon.  Reports home BP normally runs at 140/86 but can range from 150/86 to 120/86.   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    Denies: slurred speech, further episodes of weakness, loss of balance, unusual headaches, chest pain, SOB      Past Medical History:  Diagnosis Date   Bipolar 1 disorder ([place])    Chronic diarrhea    loose stools twice a day on average for years   CKD stage G4/A1, GFR 15-29 and albumin creatinine ratio <30 mg/g ([place])    Depression 1987   Malignant neoplasm of overlapping sites of left breast in female, estrogen receptor positive ([place]) 03/14/2016   Dx in 09/2014, s/p bilateral mastectomies and ALND, 0/10 LN. 1.4 cm  Grade I invasive lobular, ER and PR +/Her--, Ki 67 <5% Tried anastrozole for one month, but developed suicidal idea   Osteoarthritis, knee 09/24/2019   Xray 09/2019   Parkinsonism    Drug induced   Polyposis of colon    Psoriatic arthritis ([place])    PTSD (post-traumatic stress disorder)    Secondary hyperparathyroidism ([place])    Tardive dyskinesia    Thyroid disease      Social History   Tobacco Use   Smoking status: Never   Smokeless tobacco: Never  Vaping Use   Vaping Use: Never used  Substance Use Topics   Alcohol use: Never   Drug use: Never    Past Surgical History:  Procedure Laterality Date   ABDOMINAL HYSTERECTOMY  1987   CHOLECYSTECTOMY  1979   DILATION AND CURETTAGE OF UTERUS  1973   MASTECTOMY Bilateral 09/19/2014   TONSILLECTOMY  1970   URETERAL REIMPLANTION Bilateral 1974    Family History  Problem Relation Age of  Onset   Lymphoma Mother 77   Lymphoma Sister 21   Breast cancer Sister 78   Colon polyps Sister    Lung cancer Sister        former smoker   Stroke Maternal Grandfather    Diabetes Paternal Grandfather    Colon cancer Neg Hx    Esophageal cancer Neg Hx    Stomach cancer Neg Hx    Rectal cancer Neg Hx     Allergies  Allergen Reactions   Aripiprazole Other (See Comments)    Parkinsonism      Lactose Intolerance (Gi) Diarrhea   Methotrexate Other (See Comments)    Hair loss, severe stomatitis     Cefdinir Diarrhea     Other reaction(s): Diarrhea Yeast infection and fever; negative c diff   Etanercept Other (See Comments)    Headaches     Exemestane Other (See Comments)    Suicidal thoughts with medication     Fluoxetine Other (See Comments)    Parkinsonism   Methylprednisolone Sodium Succ Other (See Comments)    Agitated mania   Epinephrine Palpitations    tachycardia    Nitrofurantoin Other (See Comments)     GI upset from 2012    Current Medications:   Current Outpatient Medications:    Acetylcysteine (N-ACETYL CYSTEINE) 600 MG CAPS, 1 capsule Orally Twice a day, Disp: , Rfl:    atorvastatin (LIPITOR) 10 MG tablet, Take 1 tablet (10 mg total) by mouth daily., Disp: 90 tablet, Rfl: 1   B Complex Vitamins (VITAMIN B COMPLEX) TABS, 1 tablet Orally Once daily, Disp: , Rfl:    betamethasone dipropionate [id number] % cream, 1 application Externally Twice a day, Disp: , Rfl:    Cholecalciferol 50 MCG (2000 UT) TABS, 1 tablet Orally Once a day for 30 day(s), Disp: , Rfl:    Golimumab (SIMPONI ARIA IV), Infusion every 8 weeks, Disp: , Rfl:    lamoTRIgine (LAMICTAL) 100 MG tablet, Take one tablet in the morning and take one tablet at lunch. (Patient taking differently: Take 50 mg by mouth daily.), Disp: 180 tablet, Rfl: 1   lamoTRIgine (LAMICTAL) 200 MG tablet, Take 1 tablet (200 mg total) by mouth at bedtime., Disp: 90 tablet, Rfl: 3   levothyroxine (SYNTHROID) 100 MCG tablet, TAKE 100 MCG MONDAY THRU SATURDAY AND 50 MCG ON SUNDAY, Disp: , Rfl:    LORazepam (ATIVAN) 1 MG tablet, TAKE 1 TABLET(1 MG) BY MOUTH TWICE DAILY AS NEEDED, Disp: 60 tablet, Rfl: 2   metroNIDAZOLE (METROGEL) 0.75 % gel, Apply to face 1-2 times daily, Disp: 45 g, Rfl: 1   Omega-3 Fatty Acids (FISH OIL) 1200 MG CAPS, Take 1 capsule by mouth in the morning and at bedtime., Disp: , Rfl:    Probiotic Product (ALIGN) 4 MG CAPS, Take 1 capsule by mouth daily in the afternoon., Disp: , Rfl:    sodium bicarbonate 650 MG tablet, Take 650 mg  by mouth 2 (two) times daily., Disp: , Rfl:    Review of Systems:   ROS Negative unless otherwise specified per HPI.  Vitals:   Vitals:   05/06/22 0938  BP: 130/80  Pulse: 66  Temp: 98 F (36.7 C)  TempSrc: Temporal  SpO2: 97%  Weight: 188 lb 8 oz (85.5 kg)  Height: '5\' 4"'$  (1.626 m)     Body mass index is 32.36 kg/m.  Physical Exam:   Physical Exam Vitals and nursing note reviewed.  Constitutional:      General: She is  not in acute distress.    Appearance: She is well-developed. She is not ill-appearing or toxic-appearing.  Cardiovascular:     Rate and Rhythm: Normal rate and regular rhythm.     Pulses: Normal pulses.     Heart sounds: Normal heart sounds, S1 normal and S2 normal.  Pulmonary:     Effort: Pulmonary effort is normal.     Breath sounds: Normal breath sounds.  Skin:    General: Skin is warm and dry.  Neurological:     Mental Status: She is alert.     GCS: GCS eye subscore is 4. GCS verbal subscore is 5. GCS motor subscore is 6.  Psychiatric:        Speech: Speech normal.        Behavior: Behavior normal. Behavior is cooperative.     Assessment and Plan:   Weakness Ongoing - no new acute issues Discussed reason for MRI and MRA After shared decision making discussion, we decided to have her follow-up with Dr Ellouise Newer for follow-up on this and determine if this studies are necessary and to follow-up on memory issues If new/worsening sx was recommended to follow-up with [place]  Time spent with patient today was 37 minutes which consisted of chart review, discussing diagnosis, work up, treatment answering questions and documentation.   Inda Coke, PA-C

## 2022-05-06 NOTE — Patient Instructions (Signed)
It was great to see you!  Please follow-up with Dr Delice Lesch or someone with [facility] Neurology    Take care,  Inda Coke PA-C

## 2022-05-08 ENCOUNTER — Ambulatory Visit (INDEPENDENT_AMBULATORY_CARE_PROVIDER_SITE_OTHER): Payer: Medicare Other | Admitting: Internal Medicine

## 2022-05-08 ENCOUNTER — Encounter: Payer: Self-pay | Admitting: Physician Assistant

## 2022-05-08 ENCOUNTER — Encounter: Payer: Self-pay | Admitting: Internal Medicine

## 2022-05-08 VITALS — BP 122/70 | HR 58 | Ht 64.0 in | Wt 190.2 lb

## 2022-05-08 DIAGNOSIS — E039 Hypothyroidism, unspecified: Secondary | ICD-10-CM | POA: Diagnosis not present

## 2022-05-08 LAB — TSH: TSH: 0.69 u[IU]/mL (ref 0.35–5.50)

## 2022-05-08 LAB — T4, FREE: Free T4: 0.9 ng/dL (ref 0.60–1.60)

## 2022-05-08 NOTE — Progress Notes (Unsigned)
Name: Tiffany [name]  MRN/ DOB: [id number], 1950-08-02    Age/ Sex: 72 y.o., female    PCP: Inda Coke, PA   Reason for Endocrinology Evaluation: Hypothyroidism     Date of Initial Endocrinology Evaluation: 12/31/2021    HPI: Tiffany [name] is a 72 y.o. female with a past medical history of Hx of Breast Ca (no XRT no chemo), bipolar disorder, CKD, TD and parkinson's disease. The patient presented for initial endocrinology clinic visit on 12/31/2021 for consultative assistance with her Hypothyroidism.     Most of the history has been obtained from her spouse Pt has been diagnosed with hypothyroidism ~ 30 yrs ago while on Lithium  requiring LT-4 replacement.  She was on lithium until ~ 11/2021 when it was stopped  due to CKD which resulted in lower TSH and fluctuating TFT's    Saw Dr. Chalmers Cater which has reduced levothyroxine to  below dose    Levothyroxine 100 mcg Monday through Friday    Sister with thyroid disease  SUBJECTIVE:    Today (05/09/22):  Tiffany [name] is here for follow-up on hypothyroidism.   She was seen by her PCP on 05/06/2022 for memory concerns following an episode of weakness, an MRI was ordered She continues to follow-up with behavioral health for bipolar disorder She presented to the ED 03/2022 for evaluation of shortness of breath  Has been off Lithium for 5 months  She has nausea  Weight stable  Has noted  palpitations  Denies tremors  Denies local neck swelling  Denies loose stools or diarrhea    Levothyroxine 100 mcg, half a tablet on Sundays and 1 tablet rest of the week  HISTORY:  Past Medical History:  Past Medical History:  Diagnosis Date   Bipolar 1 disorder ([place])    Chronic diarrhea    loose stools twice a day on average for years   CKD stage G4/A1, GFR 15-29 and albumin creatinine ratio <30 mg/g ([place])    Depression 1987   Malignant neoplasm of overlapping sites of left breast in female, estrogen receptor positive ([place]) 03/14/2016    Dx in 09/2014, s/p bilateral mastectomies and ALND, 0/10 LN. 1.4 cm  Grade I invasive lobular, ER and PR +/Her--, Ki 67 <5% Tried anastrozole for one month, but developed suicidal idea   Osteoarthritis, knee 09/24/2019   Xray 09/2019   Parkinsonism    Drug induced   Polyposis of colon    Psoriatic arthritis ([place])    PTSD (post-traumatic stress disorder)    Secondary hyperparathyroidism ([place])    Tardive dyskinesia    Thyroid disease    Past Surgical History:  Past Surgical History:  Procedure Laterality Date   [place] Bilateral 09/19/2014   [place] Bilateral 1974    Social History:  reports that she has never smoked. She has never used smokeless tobacco. She reports that she does not drink alcohol and does not use drugs. Family History: family history includes Breast cancer (age of onset: 84) in her sister; Colon polyps in her sister; Diabetes in her paternal grandfather; Lung cancer in her sister; Lymphoma (age of onset: 63) in her sister; Lymphoma (age of onset: 54) in her mother; Stroke in her maternal grandfather.   HOME MEDICATIONS: Allergies as of 05/08/2022       Reactions   Aripiprazole Other (See  Comments)   Parkinsonism     Lactose Intolerance (gi) Diarrhea   Methotrexate Other (See Comments)   Hair loss, severe stomatitis   Cefdinir Diarrhea   Other reaction(s): Diarrhea Yeast infection and fever; negative c diff   Etanercept Other (See Comments)   Headaches   Exemestane Other (See Comments)   Suicidal thoughts with medication   Fluoxetine Other (See Comments)   Parkinsonism   Methylprednisolone Sodium Succ Other (See Comments)   Agitated mania   Epinephrine Palpitations   tachycardia   Nitrofurantoin Other (See Comments)    GI upset from 2012        Medication List        Accurate as of May 08, 2022 11:59 PM.  If you have any questions, ask your nurse or doctor.          Align 4 MG Caps Take 1 capsule by mouth daily in the afternoon.   atorvastatin 10 MG tablet Commonly known as: LIPITOR Take 1 tablet (10 mg total) by mouth daily.   betamethasone dipropionate [id number] % cream 1 application Externally Twice a day   Cholecalciferol 50 MCG (2000 UT) Tabs 1 tablet Orally Once a day for 30 day(s)   Fish Oil 1200 MG Caps Take 1 capsule by mouth in the morning and at bedtime.   lamoTRIgine 100 MG tablet Commonly known as: LaMICtal Take one tablet in the morning and take one tablet at lunch. What changed:  how much to take how to take this when to take this additional instructions   lamoTRIgine 200 MG tablet Commonly known as: LAMICTAL Take 1 tablet (200 mg total) by mouth at bedtime. What changed: Another medication with the same name was changed. Make sure you understand how and when to take each.   levothyroxine 100 MCG tablet Commonly known as: SYNTHROID TAKE 100 MCG MONDAY THRU SATURDAY AND 50 MCG ON SUNDAY   LORazepam 1 MG tablet Commonly known as: ATIVAN TAKE 1 TABLET(1 MG) BY MOUTH TWICE DAILY AS NEEDED   metroNIDAZOLE 0.75 % gel Commonly known as: METROGEL Apply to face 1-2 times daily   N-Acetyl Cysteine 600 MG Caps Generic drug: Acetylcysteine 1 capsule Orally Twice a day   SIMPONI ARIA IV Infusion every 8 weeks   sodium bicarbonate 650 MG tablet Take 650 mg by mouth 2 (two) times daily.   Vitamin B Complex Tabs 1 tablet Orally Once daily          REVIEW OF SYSTEMS: A comprehensive ROS was conducted with the patient and is negative except as per HPI   OBJECTIVE:  VS: BP 122/70 (BP Location: Left Arm, Patient Position: Sitting, Cuff Size: Large)   Pulse (!) 58   Ht '5\' 4"'$  (1.626 m)   Wt 190 lb 3.2 oz (86.3 kg)   SpO2 99%   BMI 32.65 kg/m    Wt Readings from Last 3 Encounters:  05/08/22 190 lb 3.2 oz (86.3 kg)  05/06/22 188 lb 8 oz (85.5 kg)   04/25/22 187 lb 2 oz (84.9 kg)     EXAM: General: Pt appears well and is in NAD  Eyes: External eye exam normal without stare, lid lag or exophthalmos.  EOM intact.    Neck: General: Supple without adenopathy. Thyroid: Thyroid size normal.  No goiter or nodules appreciated.   Lungs: Clear with good BS bilat with no rales, rhonchi, or wheezes  Heart: Auscultation: RRR.  Extremities:  BL LE: No pretibial edema normal ROM and strength.  Mental  Status: Judgment, insight: Intact Orientation: Oriented to time, place, and person Mood and affect: No depression, anxiety, or agitation     DATA REVIEWED:  Latest Reference Range & Units 12/31/21 12:09  TSH 0.35 - 5.50 uIU/mL 0.18 (L)  T4,Free(Direct) 0.60 - 1.60 ng/dL 1.20    ASSESSMENT/PLAN/RECOMMENDATIONS:   Hypothyroidism:   -This has been attributed to chronic lithium use.  She has been off lithium sometime over the past month which has resulted in decrease LT-4 requirements -Patient is clinically euthyroid -TSH normalized, but I prefer her TSH to be between the range of 1-3 u IU/mL -Will decrease levothyroxine as below    Medications : Stop levothyroxine 100 mcg daily Start levothyroxine 88 mcg daily   Labs in 8 weeks  Follow-up in 4 months Signed electronically by: Mack Guise, MD  [facility]  [address] [place], [place] [address] Phone: [phone] FAX: [phone]   CC: Inda Coke, [name] [address] Phone: [phone] Fax: [phone]   Return to Endocrinology clinic as below: Future Appointments  Date Time Provider [place]  05/09/2022  2:45 PM [facility] ECHO 3 [id number]  05/10/2022  3:00 PM Cameron Sprang, MD [facility] None  05/16/2022  2:40 PM Mozingo, Berdie Ogren, NP CP-CP None  05/24/2022 11:20 AM Mozingo, Berdie Ogren, NP CP-CP None  05/31/2022 11:40 AM Mozingo, Berdie Ogren, NP CP-CP None  06/06/2022 11:40 AM Mozingo, Berdie Ogren, NP CP-CP None  07/03/2022 10:00 AM LB ENDO/NEURO LAB [facility] None  11/12/2022 11:50 AM Mattie Novosel, Melanie Crazier, MD [facility] None  04/24/2023  2:00 PM [facility]

## 2022-05-09 ENCOUNTER — Other Ambulatory Visit: Payer: Self-pay | Admitting: Internal Medicine

## 2022-05-09 ENCOUNTER — Encounter: Payer: Self-pay | Admitting: Physician Assistant

## 2022-05-09 ENCOUNTER — Ambulatory Visit (HOSPITAL_COMMUNITY): Payer: Medicare Other | Attending: Internal Medicine

## 2022-05-09 ENCOUNTER — Encounter: Payer: Self-pay | Admitting: Internal Medicine

## 2022-05-09 ENCOUNTER — Ambulatory Visit (INDEPENDENT_AMBULATORY_CARE_PROVIDER_SITE_OTHER): Payer: Medicare Other | Admitting: Internal Medicine

## 2022-05-09 DIAGNOSIS — R0602 Shortness of breath: Secondary | ICD-10-CM | POA: Diagnosis present

## 2022-05-09 DIAGNOSIS — R0600 Dyspnea, unspecified: Secondary | ICD-10-CM | POA: Diagnosis present

## 2022-05-09 MED ORDER — LEVOTHYROXINE SODIUM 88 MCG PO TABS: 88.0000 ug | ORAL_TABLET | Freq: Every day | ORAL | 3 refills | Status: DC

## 2022-05-09 MED ORDER — AMLODIPINE BESYLATE 5 MG PO TABS: 5.0000 mg | ORAL_TABLET | Freq: Every day | ORAL | 1 refills | Status: DC

## 2022-05-09 NOTE — Progress Notes (Signed)
Dr. Cristopher Peru, DOD was called to Echo Department.  Pt symptomatic, weakness of bilateral upper extremities.    Orders per Dr. Lovena Le, Start patient on Amlodipine 5 mg,  Take 1 tablet (5 mg total) by mouth daily.   Script sent to [facility] in [place] .  30 tablets, 1 Refill.    Pt added to Dr. Tanna Furry clinical schedule.

## 2022-05-09 NOTE — Progress Notes (Signed)
HPI Tiffany [name] is referred to me today for sob from the echo lab. She is a pleasant 72 yo woman who is followed by Dr. Harrington Challenger who was in the echo lab and noted to have sob and arm weakness. She was seen in the nephrology office this morning and told that her bp was ok. She went to lunch at a restaurant and had vegetable soup and a grilled cheese sandwich. She was in the echo lab this afternoon and her EF is normal. She was noted to have a SBP of 190. She is not on any bp meds. Previously she had taken amlodipine.  Allergies  Allergen Reactions   Aripiprazole Other (See Comments)    Parkinsonism      Lactose Intolerance (Gi) Diarrhea   Methotrexate Other (See Comments)    Hair loss, severe stomatitis     Cefdinir Diarrhea    Other reaction(s): Diarrhea Yeast infection and fever; negative c diff   Etanercept Other (See Comments)    Headaches     Exemestane Other (See Comments)    Suicidal thoughts with medication     Fluoxetine Other (See Comments)    Parkinsonism   Methylprednisolone Sodium Succ Other (See Comments)    Agitated mania   Epinephrine Palpitations    tachycardia    Nitrofurantoin Other (See Comments)     GI upset from 2012     Current Outpatient Medications  Medication Sig Dispense Refill   amLODipine (NORVASC) 5 MG tablet Take 1 tablet (5 mg total) by mouth daily. 30 tablet 1   Acetylcysteine (N-ACETYL CYSTEINE) 600 MG CAPS 1 capsule Orally Twice a day     atorvastatin (LIPITOR) 10 MG tablet Take 1 tablet (10 mg total) by mouth daily. 90 tablet 1   B Complex Vitamins (VITAMIN B COMPLEX) TABS 1 tablet Orally Once daily     betamethasone dipropionate [id number] % cream 1 application Externally Twice a day     Cholecalciferol 50 MCG (2000 UT) TABS 1 tablet Orally Once a day for 30 day(s)     Golimumab (SIMPONI ARIA IV) Infusion every 8 weeks     lamoTRIgine (LAMICTAL) 100 MG tablet Take one tablet in the morning and take one tablet at lunch. (Patient taking  differently: Take 50 mg by mouth daily.) 180 tablet 1   lamoTRIgine (LAMICTAL) 200 MG tablet Take 1 tablet (200 mg total) by mouth at bedtime. 90 tablet 3   levothyroxine (SYNTHROID) 88 MCG tablet Take 1 tablet (88 mcg total) by mouth daily. 90 tablet 3   LORazepam (ATIVAN) 1 MG tablet TAKE 1 TABLET(1 MG) BY MOUTH TWICE DAILY AS NEEDED 60 tablet 2   metroNIDAZOLE (METROGEL) 0.75 % gel Apply to face 1-2 times daily 45 g 1   Omega-3 Fatty Acids (FISH OIL) 1200 MG CAPS Take 1 capsule by mouth in the morning and at bedtime.     Probiotic Product (ALIGN) 4 MG CAPS Take 1 capsule by mouth daily in the afternoon.     sodium bicarbonate 650 MG tablet Take 650 mg by mouth 2 (two) times daily.     No current facility-administered medications for this visit.     Past Medical History:  Diagnosis Date   Bipolar 1 disorder ([facility])    Chronic diarrhea    loose stools twice a day on average for years   CKD stage G4/A1, GFR 15-29 and albumin creatinine ratio <30 mg/g ([facility])    Depression 1987   Malignant neoplasm  of overlapping sites of left breast in female, estrogen receptor positive ([place]) 03/14/2016   Dx in 09/2014, s/p bilateral mastectomies and ALND, 0/10 LN. 1.4 cm  Grade I invasive lobular, ER and PR +/Her--, Ki 67 <5% Tried anastrozole for one month, but developed suicidal idea   Osteoarthritis, knee 09/24/2019   Xray 09/2019   Parkinsonism    Drug induced   Polyposis of colon    Psoriatic arthritis ([place])    PTSD (post-traumatic stress disorder)    Secondary hyperparathyroidism ([place])    Tardive dyskinesia    Thyroid disease     ROS:   All systems reviewed and negative except as noted in the HPI.   Past Surgical History:  Procedure Laterality Date   ABDOMINAL HYSTERECTOMY  1987   CHOLECYSTECTOMY  1979   DILATION AND CURETTAGE OF UTERUS  1973   MASTECTOMY Bilateral 09/19/2014   TONSILLECTOMY  1970   URETERAL REIMPLANTION Bilateral 1974     Family History  Problem Relation Age of  Onset   Lymphoma Mother 75   Lymphoma Sister 22   Breast cancer Sister 62   Colon polyps Sister    Lung cancer Sister        former smoker   Stroke Maternal Grandfather    Diabetes Paternal Grandfather    Colon cancer Neg Hx    Esophageal cancer Neg Hx    Stomach cancer Neg Hx    Rectal cancer Neg Hx      Social History   Socioeconomic History   Marital status: Married    Spouse name: [name]   Number of children: 3   Years of education: Not on file   Highest education level: Not on file  Occupational History   Occupation: retired  Tobacco Use   Smoking status: Never   Smokeless tobacco: Never  Vaping Use   Vaping Use: Never used  Substance and Sexual Activity   Alcohol use: Never   Drug use: Never   Sexual activity: Not Currently  Other Topics Concern   Not on file  Social History Narrative   Moved to area from [place] 08/2018   Lives one story home   Right handed.   Social Determinants of Health   Financial Resource Strain: Low Risk  (04/15/2022)   Overall Financial Resource Strain (CARDIA)    Difficulty of Paying Living Expenses: Not hard at all  Food Insecurity: No Food Insecurity (04/15/2022)   Hunger Vital Sign    Worried About Running Out of Food in the Last Year: Never true    Ran Out of Food in the Last Year: Never true  Transportation Needs: No Transportation Needs (04/15/2022)   PRAPARE - Hydrologist (Medical): No    Lack of Transportation (Non-Medical): No  Physical Activity: Sufficiently Active (04/15/2022)   Exercise Vital Sign    Days of Exercise per Week: 5 days    Minutes of Exercise per Session: 60 min  Stress: No Stress Concern Present (04/02/2021)   [place]    Feeling of Stress : Not at all  Social Connections: Moderately Integrated (04/15/2022)   Social Connection and Isolation Panel [NHANES]    Frequency of Communication with Friends and Family:  More than three times a week    Frequency of Social Gatherings with Friends and Family: More than three times a week    Attends Religious Services: More than 4 times per year    Active  Member of Clubs or Organizations: No    Attends Archivist Meetings: Never    Marital Status: Married  Human resources officer Violence: Not At Risk (04/15/2022)   Humiliation, Afraid, Rape, and Kick questionnaire    Fear of Current or Ex-Partner: No    Emotionally Abused: No    Physically Abused: No    Sexually Abused: No     BP 192/110, P - 80, resp - 20.   Physical Exam:  Well appearing NAD HEENT: Unremarkable Neck:  7 cm JVD, no thyromegally Lymphatics:  No adenopathy Back:  No CVA tenderness Lungs:  no increased work of breathin HEART:  Regular  Abd:  soft, positive bowel sounds, no organomegally, no rebound, no guarding Ext:  2 plus pulses, no edema, no cyanosis, no clubbing Skin:  No rashes no nodules Neuro:  CN II through XII intact, motor grossly intact   Assess/Plan: Dyspnea - I think that this is related to her diet and specifically increased sodium at lunch. I asked her to reduce her salt intake. HTN -her bp is much increased and too much salt from her lunch is the only explanation. She has been prescribed amlodipine which she has been on in the past and hopefully her bp will decrease  Salome Spotted

## 2022-05-09 NOTE — Telephone Encounter (Signed)
Schedule 3/1

## 2022-05-10 ENCOUNTER — Other Ambulatory Visit: Payer: Medicare Other

## 2022-05-10 ENCOUNTER — Ambulatory Visit: Payer: Medicare Other | Admitting: Adult Health

## 2022-05-10 ENCOUNTER — Encounter: Payer: Self-pay | Admitting: Neurology

## 2022-05-10 ENCOUNTER — Ambulatory Visit (INDEPENDENT_AMBULATORY_CARE_PROVIDER_SITE_OTHER): Payer: Medicare Other | Admitting: Neurology

## 2022-05-10 VITALS — BP 149/82 | HR 59 | Ht 64.0 in | Wt 189.9 lb

## 2022-05-10 DIAGNOSIS — R531 Weakness: Secondary | ICD-10-CM | POA: Diagnosis not present

## 2022-05-10 NOTE — Progress Notes (Signed)
NEUROLOGY FOLLOW UP OFFICE NOTE  Tiffany [name] [id number] 1950/10/06  HISTORY OF PRESENT ILLNESS: I had the pleasure of seeing Tiffany [name] in follow-up in the neurology clinic on 05/10/2022.  The patient was last seen almost 2 years ago for confusional episodes. She is again accompanied by her husband who helps supplement the history today.  Records and images were personally reviewed where available.  She presents today due to an episode of diffuse weakness that started in January. The first episode occurred at home, she became very short of breath, this lasted for an hour. A week later, on 04/03/22, she went to the ER due to similar symptoms. She was doing physical therapy warm up when she suddenly became short of breath and could not speak. Her arms went weak (both arms), she could not hold anything. In the ER, bloodwork showed baseline creatinine of 2.33, BUN 28, troponin/D-dimer negative. CXR and EKG normal. Symptoms resolved in 4-5 hours. She had an echocardiogram yesterday and as she was getting up from the table, again felt short of breath, a little lightheaded, and almost fell on the way out. She was taken back to the room and evaluated by Cardiology, BP 192/110. Symptoms attributed to elevated BP and salt intake. She was started on Amlodipine. BP today 142/82. She saw her PCP after the ER visit and reported bilateral arm numbness/tingling, word-finding issues. MRI brain, MRA head/neck were ordered due to concern for [name]. She presents today to discuss MRI/MRA due to concern about anxiety and the prolonged nature of 3 studies.   She and her husband deny any focal numbness/tingling/weakness. No neck/back pain. She denies any recent falls or heavy lifting. She still has some confusion but "not that deep and bad." No episodes of staring/unresponsiveness. Her balance has been off, she notes this has been off all the time recently. She has Lorazepam '1mg'$  that lately she has been taking 1/2 or 1/4 as needed.  She reports kidney function has improved a little with discontinuation of Lithium and reduction in Lamotrigine 2 weeks ago. She has not noticed any change in balance issues on these medication changes.    History on Initial Assessment 06/07/2019: This is a pleasant 72 year old right-handed woman with a history of bipolar disorder, PTSD, drug-induced parkinsonism, recently admitted for confusion with abnormal EEG, presenting to establish care. She was admitted to [facility] last 04/21/2019 for worsening gait, confusion, and tremors. She contracted COVID and symptoms significantly worsened, leading to a fall. She does not remember a lot of it, she apparently fell and called out to her husband. Her husband reported waxing and waning confusion, seemingly worse at night. She had an MRI brain without contrast which I personally reviewed, no acute changes. 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 CBC showed a WBC of 17.8, acute on chronic renal failure creatinine 2.67. Her Lithium level was 0.87. Lamotrigine level 15.3.Her Synthroid and Lamotrigine were held due to concern this was contributing to tremors/encephalopathy. She had an EEG showing diffuse slowing, at times with triphasic morphology. There were also sharp waves in the left temporoparietal region, maximal at P7/P3, at times periodic for 10-15 seconds without clear evolution and no clinical changes seen. She was restarted on lower dose Lamotrigine '100mg'$  qhs (previously on '300mg'$  daily), and low dose Levetiracetam '250mg'$  BID was added. Renal failure resolved. Synthroid resumed at lower dose. Sertraline was added by psychiatry initially,  then stopped to streamline medications. She had balance deficits and cognitive deficits with delayed processing and poor awareness of deficits.   She has a history of intermittent Lithium toxicity, she has been on this for at  least 10 years. 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 She has tried benztropine, trihexyphenidyl, Sinemet, selegiline, Requip, Azilect, with minimal benefit. The tremors had significantly improved with medication changes made in the hospital, she now has minimal tremors. She is happy to report that she can now write legibly. She has a history of tardive dyskinesia and was started on Ingrezza. Initially they felt there was not much benefit after she missed a week due to insurance issues, but they report today that her mouth movements have worsened and would like to resume Ingrezza. She has been mostly dealing with a lot of pain since hospital discharge, she has severe arthritis pain in her feet, knees, wrist/elbow. Tramadol was not helping. She sees Rheumatology and had been on Cimzia which was also held. She reports pain has been daily but she is not in pain today. She still has loss of taste. She had a migraine this morning, she has a remote history of migraines and has not had any in over a year. She used to take Excedrin but had an itching reaction recently. Her husband denies any staring/unresponsive episodes. She denies any olfactory/gustatory hallucinations, deja vu, rising epigastric sensation, focal numbness/tingling/weakness, myoclonic jerks. She denies any dizziness, diplopia, dysarthria/dysphagia, focal numbness/tingling/weakness, bowel/bladder dysfunction. She has lorazepam '1mg'$  qhs and takes it as needed during the day for anxiety. She reports depression is better, she denies any suicidal ideation.  Epilepsy Risk Factors:  Her mother had seizures. Otherwise she had a normal birth and early development.  There is no history of febrile convulsions, CNS infections such as meningitis/encephalitis, significant traumatic brain injury, neurosurgical procedures   PAST MEDICAL HISTORY: Past Medical History:  Diagnosis Date   Bipolar 1 disorder ([place])     Chronic diarrhea    loose stools twice a day on average for years   CKD stage G4/A1, GFR 15-29 and albumin creatinine ratio <30 mg/g ([place])    Depression 1987   Malignant neoplasm of overlapping sites of left breast in female, estrogen receptor positive ([place]) 03/14/2016   Dx in 09/2014, s/p bilateral mastectomies and ALND, 0/10 LN. 1.4 cm  Grade I invasive lobular, ER and PR +/Her--, Ki 67 <5% Tried anastrozole for one month, but developed suicidal idea   Osteoarthritis, knee 09/24/2019   Xray 09/2019   Parkinsonism    Drug induced   Polyposis of colon    Psoriatic arthritis ([place])    PTSD (post-traumatic stress disorder)    Secondary hyperparathyroidism ([place])    Tardive dyskinesia    Thyroid disease     MEDICATIONS: Current Outpatient Medications on File Prior to Visit  Medication Sig Dispense Refill   Acetylcysteine (N-ACETYL CYSTEINE) 600 MG CAPS 1 capsule Orally Twice a day     amLODipine (NORVASC) 5 MG tablet Take 1 tablet (5 mg total) by mouth daily. 30 tablet 1   atorvastatin (LIPITOR) 10 MG tablet Take 1 tablet (10 mg total) by mouth daily. 90 tablet 1   B Complex Vitamins (VITAMIN B COMPLEX) TABS 1 tablet Orally Once daily     betamethasone dipropionate [id number] % cream 1 application Externally Twice a day     Cholecalciferol 50 MCG (2000 UT) TABS 1 tablet Orally Once a day for 30 day(s)  Golimumab (SIMPONI ARIA IV) Infusion every 8 weeks     lamoTRIgine (LAMICTAL) 100 MG tablet Take one tablet in the morning and take one tablet at lunch. (Patient taking differently: Take 50 mg by mouth daily.) 180 tablet 1   lamoTRIgine (LAMICTAL) 200 MG tablet Take 1 tablet (200 mg total) by mouth at bedtime. 90 tablet 3   levothyroxine (SYNTHROID) 88 MCG tablet Take 1 tablet (88 mcg total) by mouth daily. 90 tablet 3   LORazepam (ATIVAN) 1 MG tablet TAKE 1 TABLET(1 MG) BY MOUTH TWICE DAILY AS NEEDED 60 tablet 2   metroNIDAZOLE (METROGEL) 0.75 % gel Apply to face 1-2 times daily 45 g 1    Omega-3 Fatty Acids (FISH OIL) 1200 MG CAPS Take 1 capsule by mouth in the morning and at bedtime.     Probiotic Product (ALIGN) 4 MG CAPS Take 1 capsule by mouth daily in the afternoon.     sodium bicarbonate 650 MG tablet Take 650 mg by mouth 2 (two) times daily.     No current facility-administered medications on file prior to visit.    ALLERGIES: Allergies  Allergen Reactions   Aripiprazole Other (See Comments)    Parkinsonism      Lactose Intolerance (Gi) Diarrhea   Methotrexate Other (See Comments)    Hair loss, severe stomatitis     Cefdinir Diarrhea    Other reaction(s): Diarrhea Yeast infection and fever; negative c diff   Etanercept Other (See Comments)    Headaches     Exemestane Other (See Comments)    Suicidal thoughts with medication     Fluoxetine Other (See Comments)    Parkinsonism   Methylprednisolone Sodium Succ Other (See Comments)    Agitated mania   Epinephrine Palpitations    tachycardia    Nitrofurantoin Other (See Comments)     GI upset from 2012    FAMILY HISTORY: Family History  Problem Relation Age of Onset   Lymphoma Mother 62   Lymphoma Sister 75   Breast cancer Sister 21   Colon polyps Sister    Lung cancer Sister        former smoker   Stroke Maternal Grandfather    Diabetes Paternal Grandfather    Colon cancer Neg Hx    Esophageal cancer Neg Hx    Stomach cancer Neg Hx    Rectal cancer Neg Hx     SOCIAL HISTORY: Social History   Socioeconomic History   Marital status: Married    Spouse name: [name]   Number of children: 3   Years of education: Not on file   Highest education level: Not on file  Occupational History   Occupation: retired  Tobacco Use   Smoking status: Never   Smokeless tobacco: Never  Vaping Use   Vaping Use: Never used  Substance and Sexual Activity   Alcohol use: Never   Drug use: Never   Sexual activity: Not Currently  Other Topics Concern   Not on file  Social History Narrative   Moved  to area from [place] 08/2018   Lives one story home   Right handed.   Social Determinants of Health   Financial Resource Strain: Low Risk  (04/15/2022)   Overall Financial Resource Strain (CARDIA)    Difficulty of Paying Living Expenses: Not hard at all  Food Insecurity: No Food Insecurity (04/15/2022)   Hunger Vital Sign    Worried About Running Out of Food in the Last Year: Never true    Ran  Out of Food in the Last Year: Never true  Transportation Needs: No Transportation Needs (04/15/2022)   PRAPARE - Hydrologist (Medical): No    Lack of Transportation (Non-Medical): No  Physical Activity: Sufficiently Active (04/15/2022)   Exercise Vital Sign    Days of Exercise per Week: 5 days    Minutes of Exercise per Session: 60 min  Stress: No Stress Concern Present (04/02/2021)   [place]    Feeling of Stress : Not at all  Social Connections: Moderately Integrated (04/15/2022)   Social Connection and Isolation Panel [NHANES]    Frequency of Communication with Friends and Family: More than three times a week    Frequency of Social Gatherings with Friends and Family: More than three times a week    Attends Religious Services: More than 4 times per year    Active Member of [employer] or Organizations: No    Attends Archivist Meetings: Never    Marital Status: Married  Human resources officer Violence: Not At Risk (04/15/2022)   Humiliation, Afraid, Rape, and Kick questionnaire    Fear of Current or Ex-Partner: No    Emotionally Abused: No    Physically Abused: No    Sexually Abused: No     PHYSICAL EXAM: Vitals:   05/10/22 1508  BP: (!) 149/82  Pulse: (!) 59  SpO2: 98%   General: No acute distress. Tardive dyskinesia with repetitive lip licking movements Head:  Normocephalic/atraumatic Skin/Extremities: No rash, no edema Neurological Exam: alert and awake. No aphasia or dysarthria. Fund of  knowledge is appropriate. Attention and concentration are normal.   Cranial nerves: Pupils equal, round. Extraocular movements intact with no nystagmus. Visual fields full. Facial sensation intact.  No facial asymmetry.  Motor: Bulk and tone normal, no cogwheeling, muscle strength 5/5 throughout with no pronator drift. Sensation intact to cold, pin, vibration sense. Reflexes +2 throughout.  Finger to nose testing intact.  Gait able to rise from chair with arms crossed over chest, gait narrow-based and steady, able to tandem walk adequately.  Romberg negative. Negative pull test. No tremors in office today. Good finger, foot taps.    IMPRESSION: This is a pleasant 72 yo RH woman with a history of bipolar disorder, PTSD, drug-induced parkinsonism, seen in the past for confusion with metabolic encephalopathy (renal failure at that time). No further similar symptoms. She presents today for evaluation of episodes of diffuse weakness in the setting of shortness of breath and elevated BP. She is also reporting balance issues. Her neurological exam today is normal, no parkinsonian signs, no focal symptoms noted. We discussed continued follow-up with PCP and Cardiology, monitor BP when symptomatic. If episodes continue to occur despite improved BP control, would proceed with brain MRI without contrast at that point. Can hold off on MRA at this time. May take prn lorazepam prior to MRI. Follow-up in 6 months, call for any changes.   Thank you for allowing me to participate in her care.  Please do not hesitate to call for any questions or concerns.    Ellouise Newer, M.D.   CC: Inda Coke, [place]

## 2022-05-10 NOTE — Patient Instructions (Addendum)
Good to see you.  Continue to monitor BP regularly and when you feel more symptoms  2. If episodes continue to occur despite improved BP control, proceed with MRI brain without contrast (no need for MRA head and neck at this time). May take Ativan 30 minutes prior to MRI  3. Follow-up in 6 months, call for any changes

## 2022-05-11 LAB — ECHOCARDIOGRAM COMPLETE
Area-P 1/2: 2.58 cm2
S' Lateral: 2.8 cm

## 2022-05-16 ENCOUNTER — Encounter: Payer: Self-pay | Admitting: Adult Health

## 2022-05-16 ENCOUNTER — Ambulatory Visit (INDEPENDENT_AMBULATORY_CARE_PROVIDER_SITE_OTHER): Payer: Medicare Other | Admitting: Adult Health

## 2022-05-16 DIAGNOSIS — F319 Bipolar disorder, unspecified: Secondary | ICD-10-CM | POA: Diagnosis not present

## 2022-05-16 DIAGNOSIS — F4310 Post-traumatic stress disorder, unspecified: Secondary | ICD-10-CM

## 2022-05-16 DIAGNOSIS — F411 Generalized anxiety disorder: Secondary | ICD-10-CM

## 2022-05-16 DIAGNOSIS — F5105 Insomnia due to other mental disorder: Secondary | ICD-10-CM | POA: Diagnosis not present

## 2022-05-16 DIAGNOSIS — F99 Mental disorder, not otherwise specified: Secondary | ICD-10-CM

## 2022-05-16 NOTE — Progress Notes (Signed)
Tiffany [name] [id number] 05/17/1950 72 y.o.  Subjective:   Patient ID:  Tiffany [name] is a 72 y.o. (DOB 1950/03/22) female.  Chief Complaint: No chief complaint on file.   HPI Tiffany [name] presents to the office today for follow-up of PTSD, insomnia, GAD and  BPD 1.  Accompanied by husband.  Describes mood today as "not so good". Pleasant. Reports decreased tearfulness. Mood symptoms - reports decreased anxiety, depression, and irritability. Reports decreased worry, rumination, and overthinking. Mood has improved. Reports 2 days of hypo-mania - did not dip down - leveled off. Denies thoughts of suicide. Stating "I'm feeling good". Feels like medications are helpful. Family supportive. Improved interest and motivation. Taking medications as prescribed. Working with therapist - Olive Bass.  Energy levels have improved. Active, working towards a regular exercise routine. Enjoys some usual interests and activities. Getting out more. Married. Lives with husband their daughter. Talking to family and friends.  Appetite improved. Weight loss. Sleep is variable. Averages 7 hours.  Focus and concentration difficulties. Completing tasks. Managing some aspects of household. Retired.  Denies SI or HI.  Denies AH or VH  Denies self harm. Denies substance use.  Previous medications: Celexa, Zyprexa, Tegretol, Depakote, Serzone, Topamax, Seroquel, Effexor, Lexapro, Desipramine, Neurontin, Abilify, Geodon, Propanolol, Cymbalta, Cogentin, Trihexyphenadyl, Sinmmet, Provigil, Selegiline, Requip, Amantadine, Prozac, Mirapex, Azilect, Metoclopramide, Baclofen, Artane, Namenda, Latuda, Lithium,      GAD-7    Flowsheet Row Office Visit from 05/06/2022 in [place] Visit from 04/25/2022 in [place] Visit from 04/10/2022 in [place] Visit from 02/16/2021 in [place] Visit from  05/14/2019 in [place]  Total GAD-7 Score '4 9 17 13 21      '$ [place] Visit from 06/07/2019 in [facility] Neurology  Total Score (max 30 points ) 29      PHQ2-9    [place] Visit from 05/06/2022 in [place] Visit from 04/25/2022 in [place] from 04/15/2022 in [place] Visit from 04/10/2022 in [place] from 11/30/2021 in [place]  PHQ-2 Total Score 0 0 0 1 0  PHQ-9 Total Score 0 6 0 3 --      Flowsheet Row Clinical Support from 04/15/2022 in [place] ED from 04/03/2022 in [facility] Department at [facility] from 11/26/2021 in [facility] Emergency Department at [place] No Risk No Risk No Risk        Review of Systems:  Review of Systems  Musculoskeletal:  Negative for gait problem.  Neurological:  Negative for tremors.  Psychiatric/Behavioral:         Please refer to HPI    Medications: I have reviewed the patient's current medications.  Current Outpatient Medications  Medication Sig Dispense Refill   Acetylcysteine (N-ACETYL CYSTEINE) 600 MG CAPS 1 capsule Orally Twice a day     amLODipine (NORVASC) 5 MG tablet Take 1 tablet (5 mg total) by mouth daily. 30 tablet 1   atorvastatin (LIPITOR) 10 MG tablet Take 1 tablet (10 mg total) by mouth daily. 90 tablet 1   B Complex Vitamins (VITAMIN B COMPLEX) TABS 1 tablet Orally Once daily     betamethasone dipropionate [id number] % cream 1 application Externally Twice a day  Cholecalciferol 50 MCG (2000 UT) TABS 1 tablet Orally Once a day for 30 day(s)     Golimumab (SIMPONI ARIA IV) Infusion every 8 weeks     lamoTRIgine (LAMICTAL) 100 MG tablet Take one tablet in the morning and take one tablet  at lunch. (Patient taking differently: Take 50 mg by mouth daily.) 180 tablet 1   lamoTRIgine (LAMICTAL) 200 MG tablet Take 1 tablet (200 mg total) by mouth at bedtime. 90 tablet 3   levothyroxine (SYNTHROID) 88 MCG tablet Take 1 tablet (88 mcg total) by mouth daily. 90 tablet 3   LORazepam (ATIVAN) 1 MG tablet TAKE 1 TABLET(1 MG) BY MOUTH TWICE DAILY AS NEEDED 60 tablet 2   metroNIDAZOLE (METROGEL) 0.75 % gel Apply to face 1-2 times daily 45 g 1   Omega-3 Fatty Acids (FISH OIL) 1200 MG CAPS Take 1 capsule by mouth in the morning and at bedtime.     Probiotic Product (ALIGN) 4 MG CAPS Take 1 capsule by mouth daily in the afternoon.     sodium bicarbonate 650 MG tablet Take 650 mg by mouth 2 (two) times daily.     No current facility-administered medications for this visit.    Medication Side Effects: None  Allergies:  Allergies  Allergen Reactions   Aripiprazole Other (See Comments)    Parkinsonism      Lactose Intolerance (Gi) Diarrhea   Methotrexate Other (See Comments)    Hair loss, severe stomatitis     Cefdinir Diarrhea    Other reaction(s): Diarrhea Yeast infection and fever; negative c diff   Etanercept Other (See Comments)    Headaches     Exemestane Other (See Comments)    Suicidal thoughts with medication     Fluoxetine Other (See Comments)    Parkinsonism   Methylprednisolone Sodium Succ Other (See Comments)    Agitated mania   Epinephrine Palpitations    tachycardia    Nitrofurantoin Other (See Comments)     GI upset from 2012    Past Medical History:  Diagnosis Date   Bipolar 1 disorder ([place])    Chronic diarrhea    loose stools twice a day on average for years   CKD stage G4/A1, GFR 15-29 and albumin creatinine ratio <30 mg/g ([place])    Depression 1987   Malignant neoplasm of overlapping sites of left breast in female, estrogen receptor positive ([place]) 03/14/2016   Dx in 09/2014, s/p bilateral mastectomies and ALND, 0/10 LN. 1.4 cm  Grade I invasive  lobular, ER and PR +/Her--, Ki 67 <5% Tried anastrozole for one month, but developed suicidal idea   Osteoarthritis, knee 09/24/2019   Xray 09/2019   Parkinsonism    Drug induced   Polyposis of colon    Psoriatic arthritis ([place])    PTSD (post-traumatic stress disorder)    Secondary hyperparathyroidism ([place])    Tardive dyskinesia    Thyroid disease     Past Medical History, Surgical history, Social history, and Family history were reviewed and updated as appropriate.   Please see review of systems for further details on the patient's review from today.   Objective:   Physical Exam:  There were no vitals taken for this visit.  Physical Exam Constitutional:      General: She is not in acute distress. Musculoskeletal:        General: No deformity.  Neurological:     Mental Status: She is alert and oriented to person, place, and time.     Coordination:  Coordination normal.  Psychiatric:        Attention and Perception: Attention and perception normal. She does not perceive auditory or visual hallucinations.        Mood and Affect: Mood normal. Mood is not anxious or depressed. Affect is not labile, blunt, angry or inappropriate.        Speech: Speech normal.        Behavior: Behavior normal.        Thought Content: Thought content normal. Thought content is not paranoid or delusional. Thought content does not include homicidal or suicidal ideation. Thought content does not include homicidal or suicidal plan.        Cognition and Memory: Cognition and memory normal.        Judgment: Judgment normal.     Comments: Insight intact     Lab Review:     Component Value Date/Time   NA 141 04/03/2022 1425   NA 142 10/17/2021 0000   K 4.0 04/03/2022 1425   CL 108 04/03/2022 1425   CO2 21 (L) 04/03/2022 1425   GLUCOSE 90 04/03/2022 1425   BUN 28 (H) 04/03/2022 1425   BUN 30 (A) 10/17/2021 0000   CREATININE 2.33 (H) 04/03/2022 1425   CREATININE 2.29 (H) 05/22/2021 1044   CALCIUM  9.7 04/03/2022 1425   PROT 7.0 12/10/2021 1044   ALBUMIN 4.4 12/10/2021 1044   AST 14 12/10/2021 1044   ALT 12 12/10/2021 1044   ALKPHOS 122 (H) 12/10/2021 1044   BILITOT 0.7 12/10/2021 1044   GFRNONAA 22 (L) 04/03/2022 1425   GFRAA 25 (L) 09/13/2019 1243       Component Value Date/Time   WBC 9.1 04/03/2022 1425   RBC 4.19 04/03/2022 1425   HGB 12.8 04/03/2022 1425   HCT 38.9 04/03/2022 1425   PLT 203 04/03/2022 1425   MCV 92.8 04/03/2022 1425   MCH 30.5 04/03/2022 1425   MCHC 32.9 04/03/2022 1425   RDW 13.1 04/03/2022 1425   LYMPHSABS 3.2 07/02/2021 1048   MONOABS 0.7 07/02/2021 1048   EOSABS 0.2 07/02/2021 1048   BASOSABS 0.1 07/02/2021 1048    Lithium Lvl  Date Value Ref Range Status  05/22/2021 0.7 0.6 - 1.2 mmol/L Final     No results found for: "PHENYTOIN", "PHENOBARB", "VALPROATE", "CBMZ"   .res Assessment: Plan:    Plan:  Lorazepam '1mg'$  BID for anxiety - may take one tablet extra for severe anxiety symptoms. Taking one at night routinely. Lamictal '200mg'$  hs Lamictal '50mg'$  every morning.  Reports a correlation of increased GFR with Lamictal  Consider low dose of Gabapentin.  NAC tablets BID - for obsessive thoughts, worry, and rumination - unable to take SSRI's  RTC weekly  Seeing Endocrinologist   Counseled patient regarding potential benefits, risks, and side effects of Lamictal to include potential risk of Stevens-Johnson syndrome. Advised patient to stop taking Lamictal and contact office immediately if rash develops and to seek urgent medical attention if rash is severe and/or spreading quickly.  Discussed potential benefits, risk, and side effects of benzodiazepines to include potential risk of tolerance and dependence, as well as possible drowsiness. Advised patient not to drive if experiencing drowsiness and to take lowest possible effective dose to minimize risk of dependence and tolerance.   Diagnoses and all orders for this visit:  Bipolar I  disorder ([place])  Generalized anxiety disorder  PTSD (post-traumatic stress disorder)  Insomnia due to other mental disorder     Please see After Visit Summary for  patient specific instructions.  Future Appointments  Date Time Provider [place]  05/31/2022 11:40 AM Layman Gully, Berdie Ogren, NP CP[facility] None  06/06/2022 11:40 AM Linville Decarolis, Berdie Ogren, NP CP-CP None  07/03/2022 10:00 AM [facility] ENDO/NEURO LAB [facility] None  11/12/2022 11:50 AM Shamleffer, Melanie Crazier, MD [facility] None  11/14/2022 11:30 AM Cameron Sprang, MD [facility] None  04/24/2023  2:00 PM [facility] HEALTH COACH [facility] P[facility]    No orders of the defined types were placed in this encounter.   -------------------------------

## 2022-05-22 ENCOUNTER — Ambulatory Visit (INDEPENDENT_AMBULATORY_CARE_PROVIDER_SITE_OTHER): Payer: Medicare Other | Admitting: Adult Health

## 2022-05-22 ENCOUNTER — Encounter: Payer: Self-pay | Admitting: Adult Health

## 2022-05-22 DIAGNOSIS — F4310 Post-traumatic stress disorder, unspecified: Secondary | ICD-10-CM | POA: Diagnosis not present

## 2022-05-22 DIAGNOSIS — F5105 Insomnia due to other mental disorder: Secondary | ICD-10-CM

## 2022-05-22 DIAGNOSIS — F411 Generalized anxiety disorder: Secondary | ICD-10-CM

## 2022-05-22 DIAGNOSIS — F319 Bipolar disorder, unspecified: Secondary | ICD-10-CM

## 2022-05-22 DIAGNOSIS — F99 Mental disorder, not otherwise specified: Secondary | ICD-10-CM

## 2022-05-22 NOTE — Progress Notes (Signed)
Arnitra Labrake Ade [id number] 11/05/1950 72 y.o.  Subjective:   Patient ID:  Tiffany [name] is a 72 y.o. (DOB 1950/07/23) female.  Chief Complaint: No chief complaint on file.   HPI Tiffany [name] presents to the office today for follow-up of PTSD, insomnia, GAD and  BPD 1.  Accompanied by husband.  Describes mood today as "not good". Pleasant. Reports increased tearfulness. Mood symptoms - reports increased anxiety, depression, and irritability. Reports worry, rumination, and overthinking. Mood is variable - "cycling". Reports increased sadness. Has felt overwhelmed over the past week. Reports 2 episodes of being out of control - lasting maybe minutes at the most - then feels sad and depressed. Getting triggered easily. Threw her walker - screamed at her husband Found pictures of sister - grieving - thought about mother. Reports "yesterday was a good day". Feels like medications are helpful. Family supportive. Varying interest and motivation. Taking medications as prescribed. Working with therapist - Olive Bass.  Energy levels vary. Active, working towards a regular exercise routine. Enjoys some usual interests and activities. Married. Lives with husband their daughter. Talking to family and friends.  Appetite improved. Weight loss. Sleep is variable. Averages 7 hours.  Focus and concentration difficulties. Completing tasks. Managing some aspects of household. Retired.  Denies SI or HI.  Denies AH or VH  Denies self harm. Denies substance use.  Previous medications: Celexa, Zyprexa, Tegretol, Depakote, Serzone, Topamax, Seroquel, Effexor, Lexapro, Desipramine, Neurontin, Abilify, Geodon, Propanolol, Cymbalta, Cogentin, Trihexyphenadyl, Sinmmet, Provigil, Selegiline, Requip, Amantadine, Prozac, Mirapex, Azilect, Metoclopramide, Baclofen, Artane, Namenda, Latuda, Lithium,       GAD-7    Flowsheet Row Office Visit from 05/06/2022 in [place] Visit from  04/25/2022 in [place] Visit from 04/10/2022 in [place] Visit from 02/16/2021 in [place] Visit from 05/14/2019 in [place]  Total GAD-7 Score '4 9 17 13 21      '$ [place] Visit from 06/07/2019 in [facility]  Total Score (max 30 points ) 29      PHQ2-9    [place] Visit from 05/06/2022 in [place] Visit from 04/25/2022 in [place] from 04/15/2022 in [place] Visit from 04/10/2022 in [place] from 11/30/2021 in [place] Coordination  PHQ-2 Total Score 0 0 0 1 0  PHQ-9 Total Score 0 6 0 3 --      Flowsheet Row Clinical Support from 04/15/2022 in [place] ED from 04/03/2022 in [facility] at [facility] ED from 11/26/2021 in [facility] Emergency Department at [place] No Risk No Risk No Risk        Review of Systems:  Review of Systems  Musculoskeletal:  Negative for gait problem.  Neurological:  Negative for tremors.  Psychiatric/Behavioral:         Please refer to HPI    Medications: I have reviewed the patient's current medications.  Current Outpatient Medications  Medication Sig Dispense Refill   Acetylcysteine (N-ACETYL CYSTEINE) 600 MG CAPS 1 capsule Orally Twice a day     amLODipine (NORVASC) 5 MG tablet Take 1 tablet (5 mg total) by mouth daily. 30 tablet 1   atorvastatin (LIPITOR) 10 MG tablet Take 1 tablet (10 mg total) by mouth daily.  90 tablet 1   B Complex Vitamins (VITAMIN B COMPLEX) TABS 1 tablet Orally Once daily     betamethasone dipropionate [id number] % cream 1 application Externally Twice a day     Cholecalciferol 50 MCG  (2000 UT) TABS 1 tablet Orally Once a day for 30 day(s)     Golimumab (SIMPONI ARIA IV) Infusion every 8 weeks     lamoTRIgine (LAMICTAL) 100 MG tablet Take one tablet in the morning and take one tablet at lunch. (Patient taking differently: Take 50 mg by mouth daily.) 180 tablet 1   lamoTRIgine (LAMICTAL) 200 MG tablet Take 1 tablet (200 mg total) by mouth at bedtime. 90 tablet 3   levothyroxine (SYNTHROID) 88 MCG tablet Take 1 tablet (88 mcg total) by mouth daily. 90 tablet 3   LORazepam (ATIVAN) 1 MG tablet TAKE 1 TABLET(1 MG) BY MOUTH TWICE DAILY AS NEEDED 60 tablet 2   metroNIDAZOLE (METROGEL) 0.75 % gel Apply to face 1-2 times daily 45 g 1   Omega-3 Fatty Acids (FISH OIL) 1200 MG CAPS Take 1 capsule by mouth in the morning and at bedtime.     Probiotic Product (ALIGN) 4 MG CAPS Take 1 capsule by mouth daily in the afternoon.     sodium bicarbonate 650 MG tablet Take 650 mg by mouth 2 (two) times daily.     No current facility-administered medications for this visit.    Medication Side Effects: None  Allergies:  Allergies  Allergen Reactions   Aripiprazole Other (See Comments)    Parkinsonism      Lactose Intolerance (Gi) Diarrhea   Methotrexate Other (See Comments)    Hair loss, severe stomatitis     Cefdinir Diarrhea    Other reaction(s): Diarrhea Yeast infection and fever; negative c diff   Etanercept Other (See Comments)    Headaches     Exemestane Other (See Comments)    Suicidal thoughts with medication     Fluoxetine Other (See Comments)    Parkinsonism   Methylprednisolone Sodium Succ Other (See Comments)    Agitated mania   Epinephrine Palpitations    tachycardia    Nitrofurantoin Other (See Comments)     GI upset from 2012    Past Medical History:  Diagnosis Date   Bipolar 1 disorder ([place])    Chronic diarrhea    loose stools twice a day on average for years   CKD stage G4/A1, 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 1.4 cm  Grade I invasive lobular, ER and PR +/Her--, Ki 67 <5% Tried anastrozole for one month, but developed suicidal idea   Osteoarthritis, knee 09/24/2019   Xray 09/2019   Parkinsonism    Drug induced   Polyposis of colon    Psoriatic arthritis ([place])    PTSD (post-traumatic stress disorder)    Secondary hyperparathyroidism ([place])    Tardive dyskinesia    Thyroid disease     Past Medical History, Surgical history, Social history, and Family history were reviewed and updated as appropriate.   Please see review of systems for further details on the patient's review from today.   Objective:   Physical Exam:  There were no vitals taken for this visit.  Physical Exam Constitutional:      General: She is not in  acute distress. Musculoskeletal:        General: No deformity.  Neurological:     Mental Status: She is alert and oriented to person, place, and time.     Coordination: Coordination normal.  Psychiatric:        Attention and Perception: Attention and perception normal. She does not perceive auditory or visual hallucinations.        Mood and Affect: Mood normal. Mood is not anxious or depressed. Affect is not labile, blunt, angry or inappropriate.        Speech: Speech normal.        Behavior: Behavior normal.        Thought Content: Thought content normal. Thought content is not paranoid or delusional. Thought content does not include homicidal or suicidal ideation. Thought content does not include homicidal or suicidal plan.        Cognition and Memory: Cognition and memory normal.        Judgment: Judgment normal.     Comments: Insight intact     Lab Review:     Component Value Date/Time   NA 141 04/03/2022 1425   NA 142 10/17/2021 0000   K 4.0 04/03/2022 1425   CL 108 04/03/2022 1425    CO2 21 (L) 04/03/2022 1425   GLUCOSE 90 04/03/2022 1425   BUN 28 (H) 04/03/2022 1425   BUN 30 (A) 10/17/2021 0000   CREATININE 2.33 (H) 04/03/2022 1425   CREATININE 2.29 (H) 05/22/2021 1044   CALCIUM 9.7 04/03/2022 1425   PROT 7.0 12/10/2021 1044   ALBUMIN 4.4 12/10/2021 1044   AST 14 12/10/2021 1044   ALT 12 12/10/2021 1044   ALKPHOS 122 (H) 12/10/2021 1044   BILITOT 0.7 12/10/2021 1044   GFRNONAA 22 (L) 04/03/2022 1425   GFRAA 25 (L) 09/13/2019 1243       Component Value Date/Time   WBC 9.1 04/03/2022 1425   RBC 4.19 04/03/2022 1425   HGB 12.8 04/03/2022 1425   HCT 38.9 04/03/2022 1425   PLT 203 04/03/2022 1425   MCV 92.8 04/03/2022 1425   MCH 30.5 04/03/2022 1425   MCHC 32.9 04/03/2022 1425   RDW 13.1 04/03/2022 1425   LYMPHSABS 3.2 07/02/2021 1048   MONOABS 0.7 07/02/2021 1048   EOSABS 0.2 07/02/2021 1048   BASOSABS 0.1 07/02/2021 1048    Lithium Lvl  Date Value Ref Range Status  05/22/2021 0.7 0.6 - 1.2 mmol/L Final     No results found for: "PHENYTOIN", "PHENOBARB", "VALPROATE", "CBMZ"   .res Assessment: Plan:    Plan:  Lorazepam '1mg'$  BID for anxiety - may take one tablet extra for severe anxiety symptoms. Taking one at night routinely. Lamictal '200mg'$  hs Lamictal '50mg'$  every morning.  Reports a correlation of increased GFR with Lamictal  Consider low dose of Gabapentin.  NAC tablets BID - for obsessive thoughts, worry, and rumination - unable to take SSRI's  RTC weekly  Seeing Endocrinologist   Counseled patient regarding potential benefits, risks, and side effects of Lamictal to include potential risk of Stevens-Johnson syndrome. Advised patient to stop taking Lamictal and contact office immediately if rash develops and to seek urgent medical attention if rash is severe and/or spreading quickly.  Discussed potential benefits, risk, and side effects of benzodiazepines to include potential risk of tolerance and dependence, as well as possible  drowsiness. Advised patient not to drive if experiencing drowsiness and to take lowest possible effective dose to minimize risk of dependence and tolerance.   There are  no diagnoses linked to this encounter.   Please see After Visit Summary for patient specific instructions.  Future Appointments  Date Time Provider [place]  05/22/2022 12:00 PM Kylil Swopes, Berdie Ogren, NP CP-CP None  05/31/2022 11:40 AM Tiler Brandis, Berdie Ogren, NP CP-CP None  06/06/2022 11:40 AM Benisha Hadaway, Berdie Ogren, NP CP-CP None  06/20/2022  1:40 PM Rochella Benner, Berdie Ogren, NP CP-CP None  07/03/2022 10:00 AM LB ENDO/NEURO LAB [facility]PC[facility] None  11/12/2022 11:50 AM Shamleffer, Melanie Crazier, MD LBPC-LBENDO None  11/14/2022 11:30 AM Cameron Sprang, MD LBN-LBNG None  04/24/2023  2:00 PM [facility] HEALTH COACH [facility] PEC    No orders of the defined types were placed in this encounter.   -------------------------------

## 2022-05-24 ENCOUNTER — Ambulatory Visit: Payer: Medicare Other | Admitting: Adult Health

## 2022-05-27 ENCOUNTER — Other Ambulatory Visit: Payer: Self-pay | Admitting: Cardiovascular Disease

## 2022-05-27 ENCOUNTER — Telehealth: Payer: Self-pay

## 2022-05-27 DIAGNOSIS — R0600 Dyspnea, unspecified: Secondary | ICD-10-CM

## 2022-05-27 DIAGNOSIS — R079 Chest pain, unspecified: Secondary | ICD-10-CM

## 2022-05-27 DIAGNOSIS — R0602 Shortness of breath: Secondary | ICD-10-CM

## 2022-05-27 NOTE — Telephone Encounter (Signed)
-----   Message from Fay Records, MD sent at 05/24/2022 11:51 PM EDT ----- Monitor shows SR   No significant arrhythmas     WIth SOB I would set up for a lexiscan myoview, to rule out ischemia

## 2022-05-27 NOTE — Telephone Encounter (Signed)
The patient has been notified of the result and verbalized understanding.  All questions (if any) were answered.  Pt also verbalized understanding of her instructions and will send to her My Chart for her review.

## 2022-05-28 ENCOUNTER — Telehealth (HOSPITAL_COMMUNITY): Payer: Self-pay | Admitting: *Deleted

## 2022-05-28 NOTE — Telephone Encounter (Signed)
Pt reached and given instructions for MPI study scheduled on 05/30/22.

## 2022-05-29 ENCOUNTER — Encounter (INDEPENDENT_AMBULATORY_CARE_PROVIDER_SITE_OTHER): Payer: Medicare Other | Admitting: Internal Medicine

## 2022-05-30 ENCOUNTER — Ambulatory Visit (HOSPITAL_COMMUNITY): Payer: Medicare Other | Attending: Internal Medicine

## 2022-05-30 DIAGNOSIS — R0600 Dyspnea, unspecified: Secondary | ICD-10-CM

## 2022-05-30 DIAGNOSIS — E039 Hypothyroidism, unspecified: Secondary | ICD-10-CM | POA: Diagnosis not present

## 2022-05-30 DIAGNOSIS — R0602 Shortness of breath: Secondary | ICD-10-CM | POA: Diagnosis not present

## 2022-05-30 LAB — MYOCARDIAL PERFUSION IMAGING
LV dias vol: 64 mL (ref 46–106)
LV sys vol: 25 mL
Nuc Stress EF: 60 %
Peak HR: 96 {beats}/min
Rest HR: 55 {beats}/min
Rest Nuclear Isotope Dose: 10.7 mCi
SDS: 0
SRS: 0
SSS: 0
ST Depression (mm): 0 mm
Stress Nuclear Isotope Dose: 30.2 mCi
TID: 1.22

## 2022-05-30 MED ORDER — TECHNETIUM TC 99M TETROFOSMIN IV KIT
30.2000 | PACK | Freq: Once | INTRAVENOUS | Status: AC | PRN
  Administered 2022-05-30: 30.2 via INTRAVENOUS

## 2022-05-30 MED ORDER — TECHNETIUM TC 99M TETROFOSMIN IV KIT
10.7000 | PACK | Freq: Once | INTRAVENOUS | Status: AC | PRN
  Administered 2022-05-30: 10.7 via INTRAVENOUS

## 2022-05-30 MED ORDER — AMINOPHYLLINE 25 MG/ML IV SOLN
75.0000 mg | Freq: Once | INTRAVENOUS | Status: AC
  Administered 2022-05-30: 75 mg via INTRAVENOUS

## 2022-05-30 MED ORDER — LEVOTHYROXINE SODIUM 50 MCG PO TABS: 50.0000 ug | ORAL_TABLET | Freq: Every day | ORAL | 6 refills | Status: DC

## 2022-05-30 MED ORDER — REGADENOSON 0.4 MG/5ML IV SOLN
0.4000 mg | Freq: Once | INTRAVENOUS | Status: AC
  Administered 2022-05-30: 0.4 mg via INTRAVENOUS

## 2022-05-30 NOTE — Telephone Encounter (Signed)
Please see the MyChart message reply(ies) for my assessment and plan.    This patient gave consent for this Medical Advice Message and is aware that it may result in a bill to [employer], as well as the possibility of receiving a bill for a co-payment or deductible. They are an established patient, but are not seeking medical advice exclusively about a problem treated during an in person or video visit in the last seven days. I did not recommend an in person or video visit within seven days of my reply.    I spent a total of 5 minutes cumulative time within 7 days through [employer].  Abby Nena Jordan, MD  [facility]  [facility] [address] [place], [place] [address] Phone: [phone] FAX: [phone]

## 2022-05-31 ENCOUNTER — Encounter: Payer: Self-pay | Admitting: Adult Health

## 2022-05-31 ENCOUNTER — Telehealth (INDEPENDENT_AMBULATORY_CARE_PROVIDER_SITE_OTHER): Payer: Medicare Other | Admitting: Adult Health

## 2022-05-31 DIAGNOSIS — F4310 Post-traumatic stress disorder, unspecified: Secondary | ICD-10-CM

## 2022-05-31 DIAGNOSIS — F411 Generalized anxiety disorder: Secondary | ICD-10-CM | POA: Diagnosis not present

## 2022-05-31 DIAGNOSIS — F5105 Insomnia due to other mental disorder: Secondary | ICD-10-CM

## 2022-05-31 DIAGNOSIS — F99 Mental disorder, not otherwise specified: Secondary | ICD-10-CM

## 2022-05-31 DIAGNOSIS — F319 Bipolar disorder, unspecified: Secondary | ICD-10-CM

## 2022-05-31 NOTE — Progress Notes (Signed)
Tiffany [name] [id number] December 21, 1950 72 y.o.  Virtual Visit via Video Note  I connected with pt @ on 05/31/22 at 11:40 AM EDT by a video enabled telemedicine application and verified that I am speaking with the correct person using two identifiers.   I discussed the limitations of evaluation and management by telemedicine and the availability of in person appointments. The patient expressed understanding and agreed to proceed.  I discussed the assessment and treatment plan with the patient. The patient was provided an opportunity to ask questions and all were answered. The patient agreed with the plan and demonstrated an understanding of the instructions.   The patient was advised to call back or seek an in-person evaluation if the symptoms worsen or if the condition fails to improve as anticipated.  I provided 10 minutes of non-face-to-face time during this encounter.  The patient was located at home.  The provider was located at [place].   Aloha Gell, NP   Subjective:   Patient ID:  Tiffany [name] is a 72 y.o. (DOB October 11, 1950) female.  Chief Complaint: No chief complaint on file.   HPI Tiffany [name] presents to the office today for follow-up of PTSD, insomnia, GAD and  BPD 1.  Accompanied by husband.  Describes mood today as "not good". Pleasant. Reports increased tearfulness. Mood symptoms - reports   anxiety, depression, and irritability. Reports worry, rumination, and overthinking. Mood is variable - "up and down". Reports racing thoughts - "everything" - "anything". Stating "they have been present all along". Has reached out to endocrinologist - Levothyroxine was decreased from 59mcg to 71mcg. Stating "I have been all over the place". Feels like medications are helpful. Family supportive. Varying interest and motivation. Taking medications as prescribed. Working with therapist - Olive Bass.  Energy levels vary. Active, working towards a regular exercise  routine. Enjoys some usual interests and activities. Married. Lives with husband their daughter. Talking to family and friends.  Appetite decreased. Weight loss. Reports sleeping difficulties - varies. Averages 5 to 7 hours.  Focus and concentration difficulties. Completing tasks. Managing some aspects of household. Retired.  Denies SI or HI.  Denies AH or VH  Denies self harm. Denies substance use.  Previous medications: Celexa, Zyprexa, Tegretol, Depakote, Serzone, Topamax, Seroquel, Effexor, Lexapro, Desipramine, Neurontin, Abilify, Geodon, Propanolol, Cymbalta, Cogentin, Trihexyphenadyl, Sinmmet, Provigil, Selegiline, Requip, Amantadine, Prozac, Mirapex, Azilect, Metoclopramide, Baclofen, Artane, Namenda, Latuda, Lithium,    GAD-7    Flowsheet Row Office Visit from 05/06/2022 in [place] Visit from 04/25/2022 in [place] Visit from 04/10/2022 in [place] Visit from 02/16/2021 in [place] Visit from 05/14/2019 in [place]  Total GAD-7 Score 4 9 17 13 21       [place] Office Visit from 06/07/2019 in [facility]  Total Score (max 30 points ) 29      PHQ2-9    [place] Visit from 05/06/2022 in [place] Visit from 04/25/2022 in [place] from 04/15/2022 in [place] Visit from 04/10/2022 in [place] from 11/30/2021 in [place] Coordination  PHQ-2 Total Score 0 0 0 1 0  PHQ-9 Total Score 0 6 0 3 --      Flowsheet Row Clinical Support from 04/15/2022 in [place] ED from  04/03/2022 in [facility] at [facility] ED from 11/26/2021 in [facility] Emergency  Department at [place] No Risk No Risk No Risk        Review of Systems:  Review of Systems  Musculoskeletal:  Negative for gait problem.  Neurological:  Negative for tremors.  Psychiatric/Behavioral:         Please refer to HPI    Medications: I have reviewed the patient's current medications.  Current Outpatient Medications  Medication Sig Dispense Refill   Acetylcysteine (N-ACETYL CYSTEINE) 600 MG CAPS 1 capsule Orally Twice a day     amLODipine (NORVASC) 5 MG tablet Take 1 tablet (5 mg total) by mouth daily. 30 tablet 1   atorvastatin (LIPITOR) 10 MG tablet Take 1 tablet (10 mg total) by mouth daily. 90 tablet 1   B Complex Vitamins (VITAMIN B COMPLEX) TABS 1 tablet Orally Once daily     betamethasone dipropionate [id number] % cream 1 application Externally Twice a day     Cholecalciferol 50 MCG (2000 UT) TABS 1 tablet Orally Once a day for 30 day(s)     Golimumab (SIMPONI ARIA IV) Infusion every 8 weeks     lamoTRIgine (LAMICTAL) 100 MG tablet Take one tablet in the morning and take one tablet at lunch. (Patient taking differently: Take 50 mg by mouth daily.) 180 tablet 1   lamoTRIgine (LAMICTAL) 200 MG tablet Take 1 tablet (200 mg total) by mouth at bedtime. 90 tablet 3   levothyroxine (SYNTHROID) 50 MCG tablet Take 1 tablet (50 mcg total) by mouth daily. 30 tablet 6   LORazepam (ATIVAN) 1 MG tablet TAKE 1 TABLET(1 MG) BY MOUTH TWICE DAILY AS NEEDED 60 tablet 2   metroNIDAZOLE (METROGEL) 0.75 % gel Apply to face 1-2 times daily 45 g 1   Omega-3 Fatty Acids (FISH OIL) 1200 MG CAPS Take 1 capsule by mouth in the morning and at bedtime.     Probiotic Product (ALIGN) 4 MG CAPS Take 1 capsule by mouth daily in the afternoon.     sodium bicarbonate 650 MG tablet Take 650 mg by mouth 2 (two) times daily.     No current facility-administered medications for this visit.    Medication Side Effects: None  Allergies:  Allergies  Allergen Reactions    Aripiprazole Other (See Comments)    Parkinsonism      Lactose Intolerance (Gi) Diarrhea   Methotrexate Other (See Comments)    Hair loss, severe stomatitis     Cefdinir Diarrhea    Other reaction(s): Diarrhea Yeast infection and fever; negative c diff   Etanercept Other (See Comments)    Headaches     Exemestane Other (See Comments)    Suicidal thoughts with medication     Fluoxetine Other (See Comments)    Parkinsonism   Methylprednisolone Sodium Succ Other (See Comments)    Agitated mania   Epinephrine Palpitations    tachycardia    Nitrofurantoin Other (See Comments)     GI upset from 2012    Past Medical History:  Diagnosis Date   Bipolar 1 disorder ([place])    Chronic diarrhea    loose stools twice a day on average for years   CKD stage G4/A1, 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 1.4 cm  Grade I invasive lobular, ER and PR +/Her--, Ki 67 <5% Tried anastrozole for one month, but developed suicidal idea   Osteoarthritis, knee 09/24/2019   Xray 09/2019   Parkinsonism    Drug induced   Polyposis of colon    Psoriatic arthritis ([place])    PTSD (post-traumatic stress disorder)    Secondary hyperparathyroidism ([place])    Tardive dyskinesia    Thyroid disease     Past Medical History, Surgical history, Social history, and Family history were reviewed and updated as appropriate.   Please see review of systems for further details on the patient's review from today.   Objective:   Physical Exam:  There were no vitals taken for this visit.  Physical Exam Constitutional:      General: She is not in acute distress. Musculoskeletal:        General: No deformity.  Neurological:     Mental Status: She is alert and oriented to person, place, and time.      Coordination: Coordination normal.  Psychiatric:        Attention and Perception: Attention and perception normal. She does not perceive auditory or visual hallucinations.        Mood and Affect: Mood normal. Mood is not anxious or depressed. Affect is not labile, blunt, angry or inappropriate.        Speech: Speech normal.        Behavior: Behavior normal.        Thought Content: Thought content normal. Thought content is not paranoid or delusional. Thought content does not include homicidal or suicidal ideation. Thought content does not include homicidal or suicidal plan.        Cognition and Memory: Cognition and memory normal.        Judgment: Judgment normal.     Comments: Insight intact     Lab Review:     Component Value Date/Time   NA 141 04/03/2022 1425   NA 142 10/17/2021 0000   K 4.0 04/03/2022 1425   CL 108 04/03/2022 1425   CO2 21 (L) 04/03/2022 1425   GLUCOSE 90 04/03/2022 1425   BUN 28 (H) 04/03/2022 1425   BUN 30 (A) 10/17/2021 0000   CREATININE 2.33 (H) 04/03/2022 1425   CREATININE 2.29 (H) 05/22/2021 1044   CALCIUM 9.7 04/03/2022 1425   PROT 7.0 12/10/2021 1044   ALBUMIN 4.4 12/10/2021 1044   AST 14 12/10/2021 1044   ALT 12 12/10/2021 1044   ALKPHOS 122 (H) 12/10/2021 1044   BILITOT 0.7 12/10/2021 1044   GFRNONAA 22 (L) 04/03/2022 1425   GFRAA 25 (L) 09/13/2019 1243       Component Value Date/Time   WBC 9.1 04/03/2022 1425   RBC 4.19 04/03/2022 1425   HGB 12.8 04/03/2022 1425   HCT 38.9 04/03/2022 1425   PLT 203 04/03/2022 1425   MCV 92.8 04/03/2022 1425   MCH 30.5 04/03/2022 1425   MCHC 32.9 04/03/2022 1425   RDW 13.1 04/03/2022 1425   LYMPHSABS 3.2 07/02/2021 1048   MONOABS 0.7 07/02/2021 1048   EOSABS 0.2 07/02/2021 1048   BASOSABS 0.1 07/02/2021 1048    Lithium Lvl  Date Value Ref Range Status  05/22/2021 0.7 0.6 - 1.2 mmol/L Final     No results found for: "PHENYTOIN", "PHENOBARB", "VALPROATE", "CBMZ"   .res Assessment: Plan:     Plan:  Lorazepam 1mg  BID for anxiety - may take one tablet extra for severe anxiety symptoms. Taking one  at night routinely. Lamictal 200mg  hs Lamictal 50mg  every morning.  Reports a correlation of increased GFR with Lamictal  Consider low dose of Gabapentin.  NAC tablets BID - for obsessive thoughts, worry, and rumination - unable to take SSRI's  RTC weekly  Seeing Endocrinologist   Counseled patient regarding potential benefits, risks, and side effects of Lamictal to include potential risk of Stevens-Johnson syndrome. Advised patient to stop taking Lamictal and contact office immediately if rash develops and to seek urgent medical attention if rash is severe and/or spreading quickly.  Discussed potential benefits, risk, and side effects of benzodiazepines to include potential risk of tolerance and dependence, as well as possible drowsiness. Advised patient not to drive if experiencing drowsiness and to take lowest possible effective dose to minimize risk of dependence and tolerance.  Diagnoses and all orders for this visit:  Bipolar I disorder ([place])  Generalized anxiety disorder  PTSD (post-traumatic stress disorder)  Insomnia due to other mental disorder     Please see After Visit Summary for patient specific instructions.  Future Appointments  Date Time Provider [place]  06/06/2022 11:40 AM Savonna Birchmeier, Berdie Ogren, NP CP-CP None  06/20/2022  1:40 PM Oreta Soloway, Berdie Ogren, NP CP-CP None  07/03/2022 10:00 AM LB ENDO/NEURO LAB LBPC[facility]LBENDO None  11/12/2022 11:50 AM Shamleffer, Melanie Crazier, MD LBPC-LBENDO None  11/14/2022 11:30 AM Cameron Sprang, MD LBN-LBNG None  04/24/2023  2:00 PM LBPC-[facility]PC ANNUAL WELLNESS VISIT 1 [facility] PEC    No orders of the defined types were placed in this encounter.   -------------------------------

## 2022-06-01 ENCOUNTER — Other Ambulatory Visit: Payer: Self-pay | Admitting: Physician Assistant

## 2022-06-03 ENCOUNTER — Telehealth: Payer: Self-pay | Admitting: Internal Medicine

## 2022-06-03 NOTE — Telephone Encounter (Signed)
  Pt said. She received a call this morning and she's returning a call. No notes on file

## 2022-06-03 NOTE — Telephone Encounter (Signed)
I spoke with the pt re: her Stress test results and I will forward to Dr Harrington Challenger:   Pt is having exertional chest pain and increased SOB when walking up the stairs at home and doing minimal house work.. she says it is not getting any better and ha been pretty persistent.

## 2022-06-06 ENCOUNTER — Ambulatory Visit: Payer: Medicare Other | Admitting: Adult Health

## 2022-06-10 ENCOUNTER — Other Ambulatory Visit (HOSPITAL_COMMUNITY): Payer: Self-pay | Admitting: Physician Assistant

## 2022-06-10 DIAGNOSIS — H4903 Third [oculomotor] nerve palsy, bilateral: Secondary | ICD-10-CM

## 2022-06-10 DIAGNOSIS — R41844 Frontal lobe and executive function deficit: Secondary | ICD-10-CM

## 2022-06-11 ENCOUNTER — Ambulatory Visit (INDEPENDENT_AMBULATORY_CARE_PROVIDER_SITE_OTHER): Payer: Medicare Other | Admitting: Adult Health

## 2022-06-11 ENCOUNTER — Encounter: Payer: Self-pay | Admitting: Adult Health

## 2022-06-11 DIAGNOSIS — F5105 Insomnia due to other mental disorder: Secondary | ICD-10-CM | POA: Diagnosis not present

## 2022-06-11 DIAGNOSIS — F411 Generalized anxiety disorder: Secondary | ICD-10-CM | POA: Diagnosis not present

## 2022-06-11 DIAGNOSIS — F4310 Post-traumatic stress disorder, unspecified: Secondary | ICD-10-CM

## 2022-06-11 DIAGNOSIS — F99 Mental disorder, not otherwise specified: Secondary | ICD-10-CM

## 2022-06-11 DIAGNOSIS — F319 Bipolar disorder, unspecified: Secondary | ICD-10-CM | POA: Diagnosis not present

## 2022-06-11 NOTE — Progress Notes (Signed)
Tiffany [name] [id number] 31-Aug-1950 72 y.o.  Subjective:   Patient ID:  Tiffany [name] is a 72 y.o. (DOB 08-21-1950) female.  Chief Complaint: No chief complaint on file.   HPI Tiffany [name] presents to the office today for follow-up of PTSD, insomnia, GAD and  BPD 1.  Accompanied by husband.  Describes mood today as "ok right now". Pleasant. Reports increased tearfulness. Mood symptoms - reports   anxiety, depression, and irritability - "it comes and goes". Reports "some" worry, rumination, and over thinking. Feeling more neuro sensitive "lights and sounds". Mood is variable - "up and down". Reports racing thoughts. Stating "I'm doing ok for now". Feels like medications are helpful. Has adjusted to dose change of Levothyroxine - decreased from 40mcg to 46mcg. Family supportive. Varying interest and motivation. Taking medications as prescribed. Working with therapist - Olive Bass.  Energy levels mostly lower. Active, working towards a regular exercise routine. Enjoys some usual interests and activities. Married. Lives with husband their daughter. Talking to family and friends.  Appetite adequate. Weight stable. Sleep is variable. Averages 5 to 7 hours.  Focus and concentration better. Completing tasks. Managing some aspects of household. Retired.  Denies SI or HI.  Denies AH or VH  Denies self harm. Denies substance use.  Previous medications: Celexa, Zyprexa, Tegretol, Depakote, Serzone, Topamax, Seroquel, Effexor, Lexapro, Desipramine, Neurontin, Abilify, Geodon, Propanolol, Cymbalta, Cogentin, Trihexyphenadyl, Sinmmet, Provigil, Selegiline, Requip, Amantadine, Prozac, Mirapex, Azilect, Metoclopramide, Baclofen, Artane, Namenda, Latuda, Lithium,     GAD-7    Flowsheet Row Office Visit from 05/06/2022 in [place] Visit from 04/25/2022 in [place] Visit from 04/10/2022 in [place] Visit  from 02/16/2021 in [place] Visit from 05/14/2019 in [place]  Total GAD-7 Score 4 9 17 13 21       [place] Visit from 06/07/2019 in [facility]  Total Score (max 30 points ) 29      PHQ2-9    [place] Visit from 05/06/2022 in [place] Visit from 04/25/2022 in [place] from 04/15/2022 in [place] Visit from 04/10/2022 in [place] from 11/30/2021 in [place] Coordination  PHQ-2 Total Score 0 0 0 1 0  PHQ-9 Total Score 0 6 0 3 --      Flowsheet Row Clinical Support from 04/15/2022 in [place] ED from 04/03/2022 in [facility] Emergency Department at [facility] ED from 11/26/2021 in [facility] Emergency Department at [place] No Risk No Risk No Risk        Review of Systems:  Review of Systems  Musculoskeletal:  Negative for gait problem.  Neurological:  Negative for tremors.  Psychiatric/Behavioral:         Please refer to HPI    Medications: I have reviewed the patient's current medications.  Current Outpatient Medications  Medication Sig Dispense Refill   Acetylcysteine (N-ACETYL CYSTEINE) 600 MG CAPS 1 capsule Orally Twice a day     amLODipine (NORVASC) 5 MG tablet Take 1 tablet (5 mg total) by mouth daily. 30 tablet 1   atorvastatin (LIPITOR) 10 MG tablet TAKE 1 TABLET(10 MG) BY MOUTH DAILY 90 tablet 1   B Complex Vitamins (VITAMIN B COMPLEX) TABS 1 tablet Orally Once daily  betamethasone dipropionate [id number] % cream 1 application Externally Twice a day     Cholecalciferol 50 MCG (2000 UT) TABS 1 tablet Orally Once a day for 30 day(s)     Golimumab (SIMPONI ARIA IV) Infusion every 8 weeks     lamoTRIgine (LAMICTAL)  100 MG tablet Take one tablet in the morning and take one tablet at lunch. (Patient taking differently: Take 50 mg by mouth daily.) 180 tablet 1   lamoTRIgine (LAMICTAL) 200 MG tablet Take 1 tablet (200 mg total) by mouth at bedtime. 90 tablet 3   levothyroxine (SYNTHROID) 50 MCG tablet Take 1 tablet (50 mcg total) by mouth daily. 30 tablet 6   LORazepam (ATIVAN) 1 MG tablet TAKE 1 TABLET(1 MG) BY MOUTH TWICE DAILY AS NEEDED 60 tablet 2   metroNIDAZOLE (METROGEL) 0.75 % gel Apply to face 1-2 times daily 45 g 1   Omega-3 Fatty Acids (FISH OIL) 1200 MG CAPS Take 1 capsule by mouth in the morning and at bedtime.     Probiotic Product (ALIGN) 4 MG CAPS Take 1 capsule by mouth daily in the afternoon.     sodium bicarbonate 650 MG tablet Take 650 mg by mouth 2 (two) times daily.     No current facility-administered medications for this visit.    Medication Side Effects: None  Allergies:  Allergies  Allergen Reactions   Aripiprazole Other (See Comments)    Parkinsonism      Lactose Intolerance (Gi) Diarrhea   Methotrexate Other (See Comments)    Hair loss, severe stomatitis     Cefdinir Diarrhea    Other reaction(s): Diarrhea Yeast infection and fever; negative c diff   Etanercept Other (See Comments)    Headaches     Exemestane Other (See Comments)    Suicidal thoughts with medication     Fluoxetine Other (See Comments)    Parkinsonism   Methylprednisolone Sodium Succ Other (See Comments)    Agitated mania   Epinephrine Palpitations    tachycardia    Nitrofurantoin Other (See Comments)     GI upset from 2012    Past Medical History:  Diagnosis Date   Bipolar 1 disorder ([place])    Chronic diarrhea    loose stools twice a day on average for years   CKD stage G4/A1, 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 1.4 cm  Grade I invasive lobular, ER and PR +/Her--, Ki 67 <5% Tried anastrozole for one month, but developed suicidal idea   Osteoarthritis, knee 09/24/2019   Xray 09/2019   Parkinsonism    Drug induced   Polyposis of colon    Psoriatic arthritis ([place])    PTSD (post-traumatic stress disorder)    Secondary hyperparathyroidism ([place])    Tardive dyskinesia    Thyroid disease     Past Medical History, Surgical history, Social history, and Family history were reviewed and updated as appropriate.   Please see review of systems for further details on the patient's review from today.   Objective:   Physical Exam:  There were no vitals taken for this visit.  Physical Exam Constitutional:      General: She is not in acute distress. Musculoskeletal:        General: No deformity.  Neurological:     Mental Status:  She is alert and oriented to person, place, and time.     Coordination: Coordination normal.  Psychiatric:        Attention and Perception: Attention and perception normal. She does not perceive auditory or visual hallucinations.        Mood and Affect: Mood normal. Mood is not anxious or depressed. Affect is not labile, blunt, angry or inappropriate.        Speech: Speech normal.        Behavior: Behavior normal.        Thought Content: Thought content normal. Thought content is not paranoid or delusional. Thought content does not include homicidal or suicidal ideation. Thought content does not include homicidal or suicidal plan.        Cognition and Memory: Cognition and memory normal.        Judgment: Judgment normal.     Comments: Insight intact     Lab Review:     Component Value Date/Time   NA 141 04/03/2022 1425   NA 142 10/17/2021 0000   K 4.0 04/03/2022 1425   CL 108 04/03/2022 1425   CO2 21 (L) 04/03/2022 1425   GLUCOSE 90 04/03/2022 1425   BUN 28 (H) 04/03/2022 1425   BUN 30 (A) 10/17/2021 0000   CREATININE 2.33 (H)  04/03/2022 1425   CREATININE 2.29 (H) 05/22/2021 1044   CALCIUM 9.7 04/03/2022 1425   PROT 7.0 12/10/2021 1044   ALBUMIN 4.4 12/10/2021 1044   AST 14 12/10/2021 1044   ALT 12 12/10/2021 1044   ALKPHOS 122 (H) 12/10/2021 1044   BILITOT 0.7 12/10/2021 1044   GFRNONAA 22 (L) 04/03/2022 1425   GFRAA 25 (L) 09/13/2019 1243       Component Value Date/Time   WBC 9.1 04/03/2022 1425   RBC 4.19 04/03/2022 1425   HGB 12.8 04/03/2022 1425   HCT 38.9 04/03/2022 1425   PLT 203 04/03/2022 1425   MCV 92.8 04/03/2022 1425   MCH 30.5 04/03/2022 1425   MCHC 32.9 04/03/2022 1425   RDW 13.1 04/03/2022 1425   LYMPHSABS 3.2 07/02/2021 1048   MONOABS 0.7 07/02/2021 1048   EOSABS 0.2 07/02/2021 1048   BASOSABS 0.1 07/02/2021 1048    Lithium Lvl  Date Value Ref Range Status  05/22/2021 0.7 0.6 - 1.2 mmol/L Final     No results found for: "PHENYTOIN", "PHENOBARB", "VALPROATE", "CBMZ"   .res Assessment: Plan:    Plan:  Lorazepam 1mg  BID for anxiety - may take one tablet extra for severe anxiety symptoms. Taking one at night routinely. Lamictal 200mg  hs Lamictal 50mg  every morning.  Reports a correlation of increased GFR with Lamictal  Consider low dose of Gabapentin.  NAC tablets BID - for obsessive thoughts, worry, and rumination - unable to take SSRI's  RTC weekly  Seeing Endocrinologist   Counseled patient regarding potential benefits, risks, and side effects of Lamictal to include potential risk of Stevens-Johnson syndrome. Advised patient to stop taking Lamictal and contact office immediately if rash develops and to seek urgent medical attention if rash is severe and/or spreading quickly.  Discussed potential benefits, risk, and side effects of benzodiazepines to include potential risk of tolerance and dependence, as well as possible drowsiness. Advised patient not to drive if experiencing drowsiness and to take lowest possible effective dose to minimize risk of dependence and  tolerance.   There are no diagnoses linked to this encounter.   Please see After Visit Summary for patient specific instructions.  Future Appointments  Date Time Provider [place]  06/18/2022  4:00 PM MC-MR 3 [facility] 2  06/20/2022  1:40 PM Jolan Upchurch, Berdie Ogren, NP CP-CP None  07/03/2022 10:00 AM LB ENDO/NEURO LAB [facility] None  11/12/2022 11:50 AM Shamleffer, Melanie Crazier, MD [facility]PC-LBENDO None  11/14/2022 11:30 AM Cameron Sprang, MD LBN-LBNG None  04/24/2023  2:00 PM LBPC-HPC ANNUAL WELLNESS VISIT 1 [facility]PC-[facility]PC PEC    No orders of the defined types were placed in this encounter.   -------------------------------

## 2022-06-18 ENCOUNTER — Ambulatory Visit (HOSPITAL_COMMUNITY)
Admission: RE | Admit: 2022-06-18 | Discharge: 2022-06-18 | Disposition: A | Discharge status: Home / Self Care | Payer: Medicare Other | Source: Ambulatory Visit | Attending: Physician Assistant | Admitting: Physician Assistant

## 2022-06-18 DIAGNOSIS — H4903 Third [oculomotor] nerve palsy, bilateral: Secondary | ICD-10-CM | POA: Diagnosis present

## 2022-06-18 DIAGNOSIS — R41844 Frontal lobe and executive function deficit: Secondary | ICD-10-CM | POA: Diagnosis present

## 2022-06-20 ENCOUNTER — Encounter: Payer: Self-pay | Admitting: Adult Health

## 2022-06-20 ENCOUNTER — Ambulatory Visit (INDEPENDENT_AMBULATORY_CARE_PROVIDER_SITE_OTHER): Payer: Medicare Other | Admitting: Adult Health

## 2022-06-20 DIAGNOSIS — F319 Bipolar disorder, unspecified: Secondary | ICD-10-CM

## 2022-06-20 DIAGNOSIS — F4310 Post-traumatic stress disorder, unspecified: Secondary | ICD-10-CM | POA: Diagnosis not present

## 2022-06-20 DIAGNOSIS — F411 Generalized anxiety disorder: Secondary | ICD-10-CM | POA: Diagnosis not present

## 2022-06-20 DIAGNOSIS — F99 Mental disorder, not otherwise specified: Secondary | ICD-10-CM

## 2022-06-20 DIAGNOSIS — F5105 Insomnia due to other mental disorder: Secondary | ICD-10-CM

## 2022-06-20 NOTE — Progress Notes (Signed)
Tiffany Bellingancy J Dunaway [id number] 1950-12-26 72 y.o.  Subjective:   Patient ID:  Tiffany [name] is a 72 y.o. (DOB 1950-12-26) female.  Chief Complaint: No chief complaint on file.   HPI Tiffany [name] presents to the office today for follow-up of PTSD, insomnia, GAD and  BPD 1.  Accompanied by husband.  Describes mood today as "ok right now". Pleasant. Reports increased tearfulness. Mood symptoms - reports anxiety, depression, and irritability. Reports worry, rumination, and over thinking.  Reports sensitivity to sounds and lights. Reports traumatizing thoughts - experiences. Mood is variable - "up and down". Reports racing thoughts. Stating "I feel hopeful". Feels like medications are helpful. Family supportive. Varying interest and motivation. Taking medications as prescribed. Working with therapist - Loletta SpecterSherri Peachey.  Energy levels variable. Active, working towards a regular exercise routine. Enjoys some usual interests and activities. Married. Lives with husband their daughter. Talking to family and friends.  Appetite adequate. Weight loss. Sleep is variable. Averages 5 to 7 hours. Reports some daytime sleepiness. Focus and concentration varies. Completing tasks. Managing some aspects of household. Retired.  Denies SI or HI.  Denies AH or VH  Denies self harm. Denies substance use.  Previous medications: Celexa, Zyprexa, Tegretol, Depakote, Serzone, Topamax, Seroquel, Effexor, Lexapro, Desipramine, Neurontin, Abilify, Geodon, Propanolol, Cymbalta, Cogentin, Trihexyphenadyl, Sinmmet, Provigil, Selegiline, Requip, Amantadine, Prozac, Mirapex, Azilect, Metoclopramide, Baclofen, Artane, Namenda, Latuda, Lithium,     GAD-7    Flowsheet Row [facility]  Total GAD-7 Score 4 9 17 13 21       Mini-Mental    Flowsheet Row [facility]  Total Score (max 30 points ) 29      PHQ2-9    Flowsheet Row [facility] from 11/30/2021 in [facility] Coordination  PHQ-2 Total Score 0 0 0 1 0  PHQ-9 Total Score 0 6 0 3 --      Flowsheet Row [facility]  C-SSRS RISK CATEGORY No Risk No Risk No Risk        Review of Systems:  Review of Systems  Musculoskeletal:  Negative for gait problem.  Neurological:  Negative for tremors.  Psychiatric/Behavioral:         Please refer to HPI    Medications: I have reviewed the patient's current medications.  Current Outpatient Medications  Medication Sig Dispense Refill   Acetylcysteine (N-ACETYL CYSTEINE) 600 MG CAPS 1 capsule Orally Twice a day     amLODipine (NORVASC) 5 MG tablet Take 1 tablet (5 mg total) by mouth daily. 30 tablet 1   atorvastatin (LIPITOR) 10 MG tablet TAKE 1 TABLET(10 MG) BY MOUTH DAILY 90 tablet 1   B Complex Vitamins (VITAMIN B COMPLEX) TABS 1 tablet Orally Once daily     betamethasone dipropionate 0.05 % cream 1 application Externally Twice a day  Cholecalciferol 50 MCG (2000 UT) TABS 1 tablet Orally Once a day for 30 day(s)     Golimumab (SIMPONI ARIA IV) Infusion every 8 weeks     lamoTRIgine (LAMICTAL) 100 MG tablet Take one tablet in the morning and  take one tablet at lunch. (Patient taking differently: Take 50 mg by mouth daily.) 180 tablet 1   lamoTRIgine (LAMICTAL) 200 MG tablet Take 1 tablet (200 mg total) by mouth at bedtime. 90 tablet 3   levothyroxine (SYNTHROID) 50 MCG tablet Take 1 tablet (50 mcg total) by mouth daily. 30 tablet 6   LORazepam (ATIVAN) 1 MG tablet TAKE 1 TABLET(1 MG) BY MOUTH TWICE DAILY AS NEEDED 60 tablet 2   metroNIDAZOLE (METROGEL) 0.75 % gel Apply to face 1-2 times daily 45 g 1   Omega-3 Fatty Acids (FISH OIL) 1200 MG CAPS Take 1 capsule by mouth in the morning and at bedtime.     Probiotic Product (ALIGN) 4 MG CAPS Take 1 capsule by mouth daily in the afternoon.     sodium bicarbonate 650 MG tablet Take 650 mg by mouth 2 (two) times daily.     No current facility-administered medications for this visit.    Medication Side Effects: None  Allergies:  Allergies  Allergen Reactions   Aripiprazole Other (See Comments)    Parkinsonism      Lactose Intolerance (Gi) Diarrhea   Methotrexate Other (See Comments)    Hair loss, severe stomatitis     Cefdinir Diarrhea    Other reaction(s): Diarrhea Yeast infection and fever; negative c diff   Etanercept Other (See Comments)    Headaches     Exemestane Other (See Comments)    Suicidal thoughts with medication     Fluoxetine Other (See Comments)    Parkinsonism   Methylprednisolone Sodium Succ Other (See Comments)    Agitated mania   Epinephrine Palpitations    tachycardia    Nitrofurantoin Other (See Comments)     GI upset from 2012    Past Medical History:  Diagnosis Date   Bipolar 1 disorder    Chronic diarrhea    loose stools twice a day on average for years   CKD stage G4/A1, 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 1.4 cm  Grade I invasive  lobular, ER and PR +/Her--, Ki 67 <5% Tried anastrozole for one month, but developed suicidal idea   Osteoarthritis, knee 09/24/2019   Xray 09/2019   Parkinsonism    Drug induced   Polyposis of colon    Psoriatic arthritis    PTSD (post-traumatic stress disorder)    Secondary hyperparathyroidism    Tardive dyskinesia    Thyroid disease     Past Medical History, Surgical history, Social history, and Family history were reviewed and updated as appropriate.   Please see review of systems for further details on the patient's review from today.   Objective:   Physical Exam:  There were no vitals taken for this visit.  Physical Exam Constitutional:      General: She is not in acute distress. Musculoskeletal:        General: No deformity.  Neurological:     Mental Status: She is alert and oriented to person, place, and time.     Coordination: Coordination normal.  Psychiatric:  Attention and Perception: Attention and perception normal. She does not perceive auditory or visual hallucinations.        Mood and Affect: Mood normal. Mood is not anxious or depressed. Affect is not labile, blunt, angry or inappropriate.        Speech: Speech normal.        Behavior: Behavior normal.        Thought Content: Thought content normal. Thought content is not paranoid or delusional. Thought content does not include homicidal or suicidal ideation. Thought content does not include homicidal or suicidal plan.        Cognition and Memory: Cognition and memory normal.        Judgment: Judgment normal.     Comments: Insight intact     Lab Review:     Component Value Date/Time   NA 141 04/03/2022 1425   NA 142 10/17/2021 0000   K 4.0 04/03/2022 1425   CL 108 04/03/2022 1425   CO2 21 (L) 04/03/2022 1425   GLUCOSE 90 04/03/2022 1425   BUN 28 (H) 04/03/2022 1425   BUN 30 (A) 10/17/2021 0000   CREATININE 2.33 (H) 04/03/2022 1425   CREATININE 2.29 (H) 05/22/2021 1044   CALCIUM 9.7  04/03/2022 1425   PROT 7.0 12/10/2021 1044   ALBUMIN 4.4 12/10/2021 1044   AST 14 12/10/2021 1044   ALT 12 12/10/2021 1044   ALKPHOS 122 (H) 12/10/2021 1044   BILITOT 0.7 12/10/2021 1044   GFRNONAA 22 (L) 04/03/2022 1425   GFRAA 25 (L) 09/13/2019 1243       Component Value Date/Time   WBC 9.1 04/03/2022 1425   RBC 4.19 04/03/2022 1425   HGB 12.8 04/03/2022 1425   HCT 38.9 04/03/2022 1425   PLT 203 04/03/2022 1425   MCV 92.8 04/03/2022 1425   MCH 30.5 04/03/2022 1425   MCHC 32.9 04/03/2022 1425   RDW 13.1 04/03/2022 1425   LYMPHSABS 3.2 07/02/2021 1048   MONOABS 0.7 07/02/2021 1048   EOSABS 0.2 07/02/2021 1048   BASOSABS 0.1 07/02/2021 1048    Lithium Lvl  Date Value Ref Range Status  05/22/2021 0.7 0.6 - 1.2 mmol/L Final     No results found for: "PHENYTOIN", "PHENOBARB", "VALPROATE", "CBMZ"   .res Assessment: Plan:    Plan:  Lorazepam 1mg  BID for anxiety - may take one tablet extra for severe anxiety symptoms. Taking one at night routinely. Lamictal 200mg  hs Lamictal 50mg  every morning.  Reports a correlation of increased GFR with Lamictal  Consider low dose of Gabapentin.  NAC tablets BID - for obsessive thoughts, worry, and rumination - unable to take SSRI's  RTC weekly  Seeing Endocrinologist   Counseled patient regarding potential benefits, risks, and side effects of Lamictal to include potential risk of Stevens-Johnson syndrome. Advised patient to stop taking Lamictal and contact office immediately if rash develops and to seek urgent medical attention if rash is severe and/or spreading quickly.  There are no diagnoses linked to this encounter.   Please see After Visit Summary for patient specific instructions.  Future Appointments  Date Time Provider Department Center  06/25/2022  2:40 PM Shaiann Mcmanamon, Thereasa Solo, NP CP-CP None  07/03/2022 10:00 AM LB ENDO/NEURO LAB [facility]PC[facility] None  07/03/2022  2:00 PM Venba Zenner, Thereasa Solo, NP CP-CP None   07/09/2022  1:40 PM Reegan Mctighe, Thereasa Solo, NP CP-CP None  11/12/2022 11:50 AM Shamleffer, Konrad Dolores, MD LBPC-[facility]END[facility] None  11/14/2022 11:30 AM Van Clines, MD LBN-[facility] None  04/24/2023  2:00 PM  LBPC-HPC ANNUAL WELLNESS VISIT 1 [facility]PC[facility]PC PEC    No orders of the defined types were placed in this encounter.   -------------------------------

## 2022-06-21 ENCOUNTER — Ambulatory Visit (INDEPENDENT_AMBULATORY_CARE_PROVIDER_SITE_OTHER): Payer: Medicare Other | Admitting: Family

## 2022-06-21 ENCOUNTER — Encounter: Payer: Self-pay | Admitting: Family

## 2022-06-21 VITALS — BP 121/66 | HR 60 | Temp 98.4°F | Ht 64.0 in | Wt 183.5 lb

## 2022-06-21 DIAGNOSIS — J0110 Acute frontal sinusitis, unspecified: Secondary | ICD-10-CM | POA: Diagnosis not present

## 2022-06-21 DIAGNOSIS — J029 Acute pharyngitis, unspecified: Secondary | ICD-10-CM

## 2022-06-21 LAB — POCT RAPID STREP A (OFFICE): Rapid Strep A Screen: NEGATIVE

## 2022-06-21 LAB — POC COVID19 BINAXNOW: SARS Coronavirus 2 Ag: NEGATIVE

## 2022-06-21 MED ORDER — AMOXICILLIN-POT CLAVULANATE 875-125 MG PO TABS: 1.0000 | ORAL_TABLET | Freq: Two times a day (BID) | ORAL | 0 refills | Status: DC

## 2022-06-21 NOTE — Progress Notes (Signed)
Patient ID: Tiffany [name], female    DOB: [birth year], 72 y.o.   MRN: [id number]  Chief Complaint  Patient presents with   Sinus Problem    Pt c/o Nasal congestion,fatigue, facial pain , SOB and slight sore throat. Present for 7 days, getting worse. Has tried fluids and rest which did help slightly.     HPI:      Sinus sx:    Pt c/o Nasal congestion,fatigue, facial pain , SOB and slight sore throat. Present for 7 days, getting worse. Has tried fluids and rest which did help slightly. Denies fever, has mild, dry cough. Has not tried any OTC meds. Had strep throat last month.    Assessment & Plan:  1. Sore throat rapid testing neg.  - POCT rapid strep A - POC COVID-19  2. Acute non-recurrent frontal sinusitis sending Augmentin. Advised on use & SE, advised on using OTC saline nasal spray tid & prn and generic Claritin - amoxicillin-clavulanate (AUGMENTIN) 875-125 MG tablet; Take 1 tablet by mouth 2 (two) times daily after a meal. OK to stop after 5 days if feeling better.  Dispense: 14 tablet; Refill: 0   Subjective:    Outpatient Medications Prior to Visit  Medication Sig Dispense Refill   Acetylcysteine (N-ACETYL CYSTEINE) 600 MG CAPS 1 capsule Orally Twice a day     amLODipine (NORVASC) 5 MG tablet Take 1 tablet (5 mg total) by mouth daily. 30 tablet 1   atorvastatin (LIPITOR) 10 MG tablet TAKE 1 TABLET(10 MG) BY MOUTH DAILY 90 tablet 1   B Complex Vitamins (VITAMIN B COMPLEX) TABS 1 tablet Orally Once daily     betamethasone dipropionate 0.05 % cream 1 application Externally Twice a day     Cholecalciferol 50 MCG (2000 UT) TABS 1 tablet Orally Once a day for 30 day(s)     Golimumab (SIMPONI ARIA IV) Infusion every 8 weeks     lamoTRIgine (LAMICTAL) 100 MG tablet Take one tablet in the morning and take one tablet at lunch. (Patient taking differently: Take 50 mg by mouth daily.) 180 tablet 1   lamoTRIgine (LAMICTAL) 200 MG tablet Take 1 tablet (200 mg total) by mouth at bedtime.  90 tablet 3   levothyroxine (SYNTHROID) 50 MCG tablet Take 1 tablet (50 mcg total) by mouth daily. 30 tablet 6   LORazepam (ATIVAN) 1 MG tablet TAKE 1 TABLET(1 MG) BY MOUTH TWICE DAILY AS NEEDED 60 tablet 2   metroNIDAZOLE (METROGEL) 0.75 % gel Apply to face 1-2 times daily 45 g 1   Omega-3 Fatty Acids (FISH OIL) 1200 MG CAPS Take 1 capsule by mouth in the morning and at bedtime.     Probiotic Product (ALIGN) 4 MG CAPS Take 1 capsule by mouth daily in the afternoon.     sodium bicarbonate 650 MG tablet Take 650 mg by mouth 2 (two) times daily.     No facility-administered medications prior to visit.   Past Medical History:  Diagnosis Date   Bipolar 1 disorder    Chronic diarrhea    loose stools twice a day on average for years   CKD stage G4/A1, 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 1.4 cm  Grade I invasive lobular, ER and PR +/Her--, Ki 67 <5% Tried anastrozole for one month, but  developed suicidal idea   Osteoarthritis, knee 09/24/2019   Xray 09/2019   Parkinsonism    Drug induced   Polyposis of colon    Psoriatic arthritis    PTSD (post-traumatic stress disorder)    Secondary hyperparathyroidism    Tardive dyskinesia    Thyroid disease    Past Surgical History:  Procedure Laterality Date   ABDOMINAL HYSTERECTOMY  1987   CHOLECYSTECTOMY  1979   DILATION AND CURETTAGE OF UTERUS  1973   MASTECTOMY Bilateral 09/19/2014   TONSILLECTOMY  1970   URETERAL REIMPLANTION Bilateral 1974   Allergies  Allergen Reactions   Aripiprazole Other (See Comments)    Parkinsonism      Lactose Intolerance (Gi) Diarrhea   Methotrexate Other (See Comments)    Hair loss, severe stomatitis     Cefdinir Diarrhea    Other reaction(s): Diarrhea Yeast infection and fever; negative c diff   Etanercept  Other (See Comments)    Headaches     Exemestane Other (See Comments)    Suicidal thoughts with medication     Fluoxetine Other (See Comments)    Parkinsonism   Methylprednisolone Sodium Succ Other (See Comments)    Agitated mania   Epinephrine Palpitations    tachycardia    Nitrofurantoin Other (See Comments)     GI upset from 2012      Objective:    Physical Exam Vitals and nursing note reviewed.  Constitutional:      Appearance: Normal appearance. She is ill-appearing.     Interventions: Face mask in place.  HENT:     Right Ear: Tympanic membrane and ear canal normal.     Left Ear: Tympanic membrane and ear canal normal.     Nose:     Right Sinus: Frontal sinus tenderness present.     Left Sinus: Frontal sinus tenderness present.     Mouth/Throat:     Mouth: Mucous membranes are moist.     Pharynx: Posterior oropharyngeal erythema present. No pharyngeal swelling, oropharyngeal exudate or uvula swelling.     Tonsils: No tonsillar exudate or tonsillar abscesses.  Cardiovascular:     Rate and Rhythm: Normal rate and regular rhythm.  Pulmonary:     Effort: Pulmonary effort is normal.     Breath sounds: Normal breath sounds.  Musculoskeletal:        General: Normal range of motion.     Cervical back: Neck supple.  Lymphadenopathy:     Head:     Right side of head: No preauricular or posterior auricular adenopathy.     Left side of head: No preauricular or posterior auricular adenopathy.     Cervical: No cervical adenopathy.  Skin:    General: Skin is warm and dry.  Neurological:     Mental Status: She is alert.  Psychiatric:        Mood and Affect: Mood normal.        Behavior: Behavior normal.    BP 121/66 (BP Location: Left Arm, Patient Position: Sitting, Cuff Size: Large)   Pulse 60   Temp 98.4 F (36.9 C) (Temporal)   Ht 5\' 4"  (1.626 m)   Wt 183 lb 8 oz (83.2 kg)   SpO2 97%   BMI 31.50 kg/m  Wt Readings from Last 3 Encounters:  06/21/22 183 lb 8  oz (83.2 kg)  05/30/22 189 lb (85.7 kg)  05/10/22 189 lb 14.4 oz (86.1 kg)      Dulce Sellar, NP

## 2022-06-25 ENCOUNTER — Encounter: Payer: Self-pay | Admitting: Adult Health

## 2022-06-25 ENCOUNTER — Ambulatory Visit (INDEPENDENT_AMBULATORY_CARE_PROVIDER_SITE_OTHER): Payer: Medicare Other | Admitting: Adult Health

## 2022-06-25 DIAGNOSIS — F5105 Insomnia due to other mental disorder: Secondary | ICD-10-CM

## 2022-06-25 DIAGNOSIS — F99 Mental disorder, not otherwise specified: Secondary | ICD-10-CM

## 2022-06-25 DIAGNOSIS — F319 Bipolar disorder, unspecified: Secondary | ICD-10-CM

## 2022-06-25 DIAGNOSIS — F411 Generalized anxiety disorder: Secondary | ICD-10-CM

## 2022-06-25 DIAGNOSIS — F4310 Post-traumatic stress disorder, unspecified: Secondary | ICD-10-CM

## 2022-06-25 NOTE — Progress Notes (Signed)
Tiffany [name] [id number] 03/11/1951 72 y.o.  Subjective:   Patient ID:  Tiffany [name] is a 72 y.o. (DOB 11-23-1950) female.  Chief Complaint: No chief complaint on file.   HPI Tiffany [name] presents to the office today for follow-up of PTSD, insomnia, GAD and  BPD 1.  Accompanied by husband.  Describes mood today as "ok right now". Pleasant. Reports increased tearfulness. Mood symptoms - reports depression - "feeling down". Recovering from sinus infection. Denies anxiety and irritability. Denies worry, rumination, and over thinking. Reports sensitivity to sounds and lights. Reports decreased racing thoughts. Mood is variable - "up and down".  Stating "I feel blah today". Feels like medications are helpful. Family supportive. Varying interest and motivation. Taking medications as prescribed. Working with therapist - [name].  Energy levels lower. Active, working towards a regular exercise routine. Enjoys some usual interests and activities. Married. Lives with husband their daughter. Talking to family and friends.  Appetite adequate. Weight fluctuates. Sleep is variable. Averages 5 to 7 hours. Reports some daytime sleepiness. Focus and concentration varies. Completing tasks. Managing some aspects of household. Retired.  Denies SI or HI.  Denies AH or VH  Denies self harm. Denies substance use.  Previous medications: Celexa, Zyprexa, Tegretol, Depakote, Serzone, Topamax, Seroquel, Effexor, Lexapro, Desipramine, Neurontin, Abilify, Geodon, Propanolol, Cymbalta, Cogentin, Trihexyphenadyl, Sinmmet, Provigil, Selegiline, Requip, Amantadine, Prozac, Mirapex, Azilect, Metoclopramide, Baclofen, Artane, Namenda, Latuda, Lithium,    GAD-7    Flowsheet Row Office Visit from 05/06/2022 in [facility] from 04/25/2022 in [facility] from 04/10/2022 in [facility] from 02/16/2021 in [facility] from 05/14/2019 in [facility]  Total GAD-7 Score Mini-Mental    Flowsheet Row Office Visit from 06/07/2019 in [facility]  Total Score (max 30 points ) 29      PHQ2-9    Flowsheet Row Office Visit from 05/06/2022 in [facility] from 04/25/2022 in [facility] Clinical Support from 04/15/2022 in [facility] from 04/10/2022 in [facility] Coordination from 11/30/2021 in [facility] Coord[facility]  PHQ-2 Total Score 0 0 0 1 0  PHQ-9 Total Score 0 6 0 3 --      Flowsheet Row Clinical Support from 04/15/2022 in [facility] ED from 04/03/2022 in [facility] Department at [facility] ED from 11/26/2021 in [facility] Emergency Department at [facility]  C-SSRS RISK CATEGORY No Risk No Risk No Risk        Review of Systems:  Review of Systems  Musculoskeletal:  Negative for gait problem.  Neurological:  Negative for tremors.  Psychiatric/Behavioral:         Please refer to HPI    Medications: I have reviewed the patient's current medications.  Current Outpatient Medications  Medication Sig Dispense Refill   Acetylcysteine (N-ACETYL CYSTEINE) 600 MG CAPS 1 capsule Orally Twice a day     amLODipine (NORVASC) 5 MG tablet Take 1 tablet (5 mg total) by mouth daily. 30 tablet 1   amoxicillin-clavulanate (AUGMENTIN) 875-125 MG tablet Take 1 tablet by mouth 2 (two) times daily after a meal. OK to stop after 5 days if feeling better. 14 tablet 0   atorvastatin (LIPITOR) 10 MG tablet TAKE 1 TABLET(10 MG) BY MOUTH DAILY  90 tablet 1   B Complex Vitamins (VITAMIN B COMPLEX) TABS 1 tablet Orally Once daily     betamethasone dipropionate 0.05 % cream 1 application Externally Twice a day     Cholecalciferol 50  MCG (2000 UT) TABS 1 tablet Orally Once a day for 30 day(s)     Golimumab (SIMPONI ARIA IV) Infusion every 8 weeks     lamoTRIgine (LAMICTAL) 100 MG tablet Take one tablet in the morning and take one tablet at lunch. (Patient taking differently: Take 50 mg by mouth daily.) 180 tablet 1   lamoTRIgine (LAMICTAL) 200 MG tablet Take 1 tablet (200 mg total) by mouth at bedtime. 90 tablet 3   levothyroxine (SYNTHROID) 50 MCG tablet Take 1 tablet (50 mcg total) by mouth daily. 30 tablet 6   LORazepam (ATIVAN) 1 MG tablet TAKE 1 TABLET(1 MG) BY MOUTH TWICE DAILY AS NEEDED 60 tablet 2   metroNIDAZOLE (METROGEL) 0.75 % gel Apply to face 1-2 times daily 45 g 1   Omega-3 Fatty Acids (FISH OIL) 1200 MG CAPS Take 1 capsule by mouth in the morning and at bedtime.     Probiotic Product (ALIGN) 4 MG CAPS Take 1 capsule by mouth daily in the afternoon.     sodium bicarbonate 650 MG tablet Take 650 mg by mouth 2 (two) times daily.     No current facility-administered medications for this visit.    Medication Side Effects: None  Allergies:  Allergies  Allergen Reactions   Aripiprazole Other (See Comments)    Parkinsonism      Lactose Intolerance (Gi) Diarrhea   Methotrexate Other (See Comments)    Hair loss, severe stomatitis     Cefdinir Diarrhea    Other reaction(s): Diarrhea Yeast infection and fever; negative c diff   Etanercept Other (See Comments)    Headaches     Exemestane Other (See Comments)    Suicidal thoughts with medication     Fluoxetine Other (See Comments)    Parkinsonism   Methylprednisolone Sodium Succ Other (See Comments)    Agitated mania   Epinephrine Palpitations    tachycardia    Nitrofurantoin Other (See Comments)     GI upset from 2012    Past Medical History:  Diagnosis Date   Bipolar 1 disorder    Chronic diarrhea    loose stools twice a day on average for years   CKD stage G4/A1, GFR 15-29 and albumin creatinine ratio <30 mg/g    Depression 1987    Malignant neoplasm of overlapping sites of left breast in female, estrogen receptor positive 03/14/2016   Dx in 09/2014, s/p bilateral mastectomies and ALND, 0/10 LN. 1.4 cm  Grade I invasive lobular, ER and PR +/Her--, Ki 67 <5% Tried anastrozole for one month, but developed suicidal idea   Osteoarthritis, knee 09/24/2019   Xray 09/2019   Parkinsonism    Drug induced   Polyposis of colon    Psoriatic arthritis    PTSD (post-traumatic stress disorder)    Secondary hyperparathyroidism    Tardive dyskinesia    Thyroid disease     Past Medical History, Surgical history, Social history, and Family history were reviewed and updated as appropriate.   Please see review of systems for further details on the patient's review from today.   Objective:   Physical Exam:  There were no vitals taken for this visit.  Physical Exam Constitutional:      General: She is not in acute distress. Musculoskeletal:  General: No deformity.  Neurological:     Mental Status: She is alert and oriented to person, place, and time.     Coordination: Coordination normal.  Psychiatric:        Attention and Perception: Attention and perception normal. She does not perceive auditory or visual hallucinations.        Mood and Affect: Mood normal. Mood is not anxious or depressed. Affect is not labile, blunt, angry or inappropriate.        Speech: Speech normal.        Behavior: Behavior normal.        Thought Content: Thought content normal. Thought content is not paranoid or delusional. Thought content does not include homicidal or suicidal ideation. Thought content does not include homicidal or suicidal plan.        Cognition and Memory: Cognition and memory normal.        Judgment: Judgment normal.     Comments: Insight intact     Lab Review:     Component Value Date/Time   NA 141 04/03/2022 1425   NA 142 10/17/2021 0000   K 4.0 04/03/2022 1425   CL 108 04/03/2022 1425   CO2 21 (L) 04/03/2022 1425    GLUCOSE 90 04/03/2022 1425   BUN 28 (H) 04/03/2022 1425   BUN 30 (A) 10/17/2021 0000   CREATININE 2.33 (H) 04/03/2022 1425   CREATININE 2.29 (H) 05/22/2021 1044   CALCIUM 9.7 04/03/2022 1425   PROT 7.0 12/10/2021 1044   ALBUMIN 4.4 12/10/2021 1044   AST 14 12/10/2021 1044   ALT 12 12/10/2021 1044   ALKPHOS 122 (H) 12/10/2021 1044   BILITOT 0.7 12/10/2021 1044   GFRNONAA 22 (L) 04/03/2022 1425   GFRAA 25 (L) 09/13/2019 1243       Component Value Date/Time   WBC 9.1 04/03/2022 1425   RBC 4.19 04/03/2022 1425   HGB 12.8 04/03/2022 1425   HCT 38.9 04/03/2022 1425   PLT 203 04/03/2022 1425   MCV 92.8 04/03/2022 1425   MCH 30.5 04/03/2022 1425   MCHC 32.9 04/03/2022 1425   RDW 13.1 04/03/2022 1425   LYMPHSABS 3.2 07/02/2021 1048   MONOABS 0.7 07/02/2021 1048   EOSABS 0.2 07/02/2021 1048   BASOSABS 0.1 07/02/2021 1048    Lithium Lvl  Date Value Ref Range Status  05/22/2021 0.7 0.6 - 1.2 mmol/L Final     No results found for: "PHENYTOIN", "PHENOBARB", "VALPROATE", "CBMZ"   .res Assessment: Plan:    Plan:  Lorazepam 1mg  BID for anxiety - may take one tablet extra for severe anxiety symptoms. Taking one at night routinely. Lamictal 200mg  hs Lamictal 50mg  every morning.  Consider low dose of Gabapentin.  NAC tablets BID - for obsessive thoughts, worry, and rumination - unable to take SSRI's  RTC weekly  Seeing Endocrinologist   Counseled patient regarding potential benefits, risks, and side effects of Lamictal to include potential risk of Stevens-Johnson syndrome. Advised patient to stop taking Lamictal and contact office immediately if rash develops and to seek urgent medical attention if rash is severe and/or spreading quickly.  Diagnoses and all orders for this visit:  Bipolar I disorder  Generalized anxiety disorder  PTSD (post-traumatic stress disorder)  Insomnia due to other mental disorder     Please see After Visit Summary for patient specific  instructions.  Future Appointments  Date Time Provider Department Center  07/03/2022 10:00 AM [facility] ENDO[facility]NEURO LAB [facility] None  07/03/2022  2:00 PM Deshanti Adcox, Thereasa Solo, NP  [facility] None  07/09/2022  1:40 PM Talisa Petrak, Thereasa Solo, NP [facility] None  11/12/2022 11:50 AM Shamleffer, Konrad Dolores, MD [facility] None  11/14/2022 11:30 AM Van Clines, MD [facility] None  04/24/2023  2:00 PM [facility] ANNUAL WELLNESS VISIT 1 [facility] PEC    No orders of the defined types were placed in this encounter.   -------------------------------

## 2022-07-01 ENCOUNTER — Encounter: Payer: Self-pay | Admitting: Physician Assistant

## 2022-07-02 ENCOUNTER — Other Ambulatory Visit: Payer: Self-pay | Admitting: Physician Assistant

## 2022-07-02 MED ORDER — AMLODIPINE BESYLATE 5 MG PO TABS: 5.0000 mg | ORAL_TABLET | Freq: Every day | ORAL | 1 refills | Status: DC

## 2022-07-03 ENCOUNTER — Encounter: Payer: Self-pay | Admitting: Adult Health

## 2022-07-03 ENCOUNTER — Encounter: Payer: Self-pay | Admitting: Family

## 2022-07-03 ENCOUNTER — Ambulatory Visit: Payer: Medicare Other | Admitting: Physician Assistant

## 2022-07-03 ENCOUNTER — Ambulatory Visit (INDEPENDENT_AMBULATORY_CARE_PROVIDER_SITE_OTHER): Payer: Medicare Other | Admitting: Adult Health

## 2022-07-03 ENCOUNTER — Encounter: Payer: Self-pay | Admitting: Physician Assistant

## 2022-07-03 ENCOUNTER — Encounter: Payer: Self-pay | Admitting: Internal Medicine

## 2022-07-03 ENCOUNTER — Other Ambulatory Visit (INDEPENDENT_AMBULATORY_CARE_PROVIDER_SITE_OTHER): Payer: Medicare Other

## 2022-07-03 ENCOUNTER — Telehealth: Payer: Self-pay | Admitting: Physician Assistant

## 2022-07-03 ENCOUNTER — Ambulatory Visit (INDEPENDENT_AMBULATORY_CARE_PROVIDER_SITE_OTHER): Payer: Medicare Other | Admitting: Family

## 2022-07-03 ENCOUNTER — Other Ambulatory Visit (HOSPITAL_COMMUNITY)
Admission: RE | Admit: 2022-07-03 | Discharge: 2022-07-03 | Disposition: A | Discharge status: Home / Self Care | Payer: Medicare Other | Source: Ambulatory Visit | Attending: Physician Assistant | Admitting: Physician Assistant

## 2022-07-03 VITALS — BP 146/76 | HR 51 | Temp 97.7°F | Ht 64.0 in | Wt 187.5 lb

## 2022-07-03 DIAGNOSIS — E039 Hypothyroidism, unspecified: Secondary | ICD-10-CM | POA: Diagnosis not present

## 2022-07-03 DIAGNOSIS — F411 Generalized anxiety disorder: Secondary | ICD-10-CM

## 2022-07-03 DIAGNOSIS — F5105 Insomnia due to other mental disorder: Secondary | ICD-10-CM

## 2022-07-03 DIAGNOSIS — R300 Dysuria: Secondary | ICD-10-CM

## 2022-07-03 DIAGNOSIS — F319 Bipolar disorder, unspecified: Secondary | ICD-10-CM | POA: Diagnosis not present

## 2022-07-03 DIAGNOSIS — F4310 Post-traumatic stress disorder, unspecified: Secondary | ICD-10-CM | POA: Diagnosis not present

## 2022-07-03 DIAGNOSIS — F99 Mental disorder, not otherwise specified: Secondary | ICD-10-CM

## 2022-07-03 LAB — POCT URINALYSIS DIPSTICK
Bilirubin, UA: NEGATIVE
Blood, UA: NEGATIVE
Glucose, UA: NEGATIVE
Ketones, UA: NEGATIVE
Leukocytes, UA: NEGATIVE
Nitrite, UA: NEGATIVE
Protein, UA: NEGATIVE
Spec Grav, UA: 1.01 (ref 1.010–1.025)
Urobilinogen, UA: 0.2 E.U./dL
pH, UA: 6 (ref 5.0–8.0)

## 2022-07-03 LAB — T4, FREE: Free T4: 0.78 ng/dL (ref 0.60–1.60)

## 2022-07-03 LAB — TSH: TSH: 5.23 u[IU]/mL (ref 0.35–5.50)

## 2022-07-03 NOTE — Progress Notes (Signed)
Tiffany [name] [id number] 1950-05-10 72 y.o.  Subjective:   Patient ID:  Tiffany [name] is a 72 y.o. (DOB [birth year]) female.  Chief Complaint: No chief complaint on file.   HPI Tiffany [name] presents to the office today for follow-up of PTSD, insomnia, GAD and  BPD 1.  Accompanied by husband.  Describes mood today as "ok right now". Pleasant. Reports tearfulness. Reports mood fluctuations - twice a day. Mood symptoms - reports depression - "underneath the layers". Reports anxiety and irritability. Feels easily overwhelmed - good things and bad things. Reports worry, rumination, and over thinking. Reports sensitivity to sounds and lights. Reports racing thoughts. Mood is variable - "up and down".  Stating "I'm not feeling good". Feels like she may have a UTI - recently recovering from sinus infection.Feels like medications are helpful. Family supportive. Varying interest and motivation. Taking medications as prescribed. Working with therapist - [name].  Energy levels lower. Active, working towards a regular exercise routine. Enjoys some usual interests and activities. Married. Lives with husband their daughter. Talking to family and friends.  Appetite adequate. Weight fluctuates. Sleep is variable. Taking a long time to get to sleep.  Focus and concentration varies. Completing tasks. Managing some aspects of household. Retired.  Denies SI or HI.  Denies AH or VH  Denies self harm. Denies substance use.  Previous medications: Celexa, Zyprexa, Tegretol, Depakote, Serzone, Topamax, Seroquel, Effexor, Lexapro, Desipramine, Neurontin, Abilify, Geodon, Propanolol, Cymbalta, Cogentin, Trihexyphenadyl, Sinmmet, Provigil, Selegiline, Requip, Amantadine, Prozac, Mirapex, Azilect, Metoclopramide, Baclofen, Artane, Namenda, Latuda, Lithium,    GAD-7    Flowsheet Row Office Visit from 05/06/2022 in [facility] from 04/25/2022 in [facility] from 04/10/2022 in [facility] from 02/16/2021 in [facility] from 05/14/2019 in [facility]  Total GAD-7 Score Mini-Mental    Flowsheet Row Office Visit from 06/07/2019 in [facility]  Total Score (max 30 points ) 29      PHQ2-9    Flowsheet Row Office Visit from 05/06/2022 in [facility] from 04/25/2022 in [facility] Clinical Support from 04/15/2022 in [facility] from 04/10/2022 in [facility] Coordination from 11/30/2021 in [facility] Coord[facility]  PHQ-2 Total Score 0 0 0 1 0  PHQ-9 Total Score 0 6 0 3 --      Flowsheet Row Clinical Support from 04/15/2022 in [facility] ED from 04/03/2022 in [facility] Main Emergency Department at [facility] ED from 11/26/2021 in [facility] Emergency Department at [facility]  C-SSRS RISK CATEGORY No Risk No Risk No Risk        Review of Systems:  Review of Systems  Musculoskeletal:  Negative for gait problem.  Neurological:  Negative for tremors.  Psychiatric/Behavioral:         Please refer to HPI    Medications: I have reviewed the patient's current medications.  Current Outpatient Medications  Medication Sig Dispense Refill   Acetylcysteine (N-ACETYL CYSTEINE) 600 MG CAPS 1 capsule Orally Twice a day     amLODipine (NORVASC) 5 MG tablet Take 1 tablet (5 mg total) by mouth daily. 90 tablet 1   atorvastatin (LIPITOR) 10 MG tablet TAKE 1 TABLET(10 MG) BY MOUTH DAILY 90 tablet 1   B Complex Vitamins (  VITAMIN B COMPLEX) TABS 1 tablet Orally Once daily     betamethasone dipropionate 0.05 % cream 1 application Externally Twice a day     Cholecalciferol 50 MCG (2000 UT) TABS 1 tablet Orally Once a day for 30 day(s)      Golimumab (SIMPONI ARIA IV) Infusion every 8 weeks     lamoTRIgine (LAMICTAL) 100 MG tablet Take one tablet in the morning and take one tablet at lunch. (Patient taking differently: Take 50 mg by mouth daily.) 180 tablet 1   lamoTRIgine (LAMICTAL) 200 MG tablet Take 1 tablet (200 mg total) by mouth at bedtime. 90 tablet 3   levothyroxine (SYNTHROID) 50 MCG tablet Take 1 tablet (50 mcg total) by mouth daily. 30 tablet 6   LORazepam (ATIVAN) 1 MG tablet TAKE 1 TABLET(1 MG) BY MOUTH TWICE DAILY AS NEEDED 60 tablet 2   metroNIDAZOLE (METROGEL) 0.75 % gel Apply to face 1-2 times daily 45 g 1   Omega-3 Fatty Acids (FISH OIL) 1200 MG CAPS Take 1 capsule by mouth in the morning and at bedtime.     Probiotic Product (ALIGN) 4 MG CAPS Take 1 capsule by mouth daily in the afternoon.     sodium bicarbonate 650 MG tablet Take 650 mg by mouth 2 (two) times daily.     No current facility-administered medications for this visit.    Medication Side Effects: None  Allergies:  Allergies  Allergen Reactions   Aripiprazole Other (See Comments)    Parkinsonism      Lactose Intolerance (Gi) Diarrhea   Methotrexate Other (See Comments)    Hair loss, severe stomatitis     Cefdinir Diarrhea    Other reaction(s): Diarrhea Yeast infection and fever; negative c diff   Etanercept Other (See Comments)    Headaches     Exemestane Other (See Comments)    Suicidal thoughts with medication     Fluoxetine Other (See Comments)    Parkinsonism   Methylprednisolone Sodium Succ Other (See Comments)    Agitated mania   Epinephrine Palpitations    tachycardia    Nitrofurantoin Other (See Comments)     GI upset from 2012    Past Medical History:  Diagnosis Date   Bipolar 1 disorder    Chronic diarrhea    loose stools twice a day on average for years   CKD stage G4/A1, 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 1.4 cm  Grade I invasive lobular, ER and PR +/Her--, Ki 67 <5% Tried anastrozole for one month, but developed suicidal idea   Osteoarthritis, knee 09/24/2019   Xray 09/2019   Parkinsonism    Drug induced   Polyposis of colon    Psoriatic arthritis    PTSD (post-traumatic stress disorder)    Secondary hyperparathyroidism    Tardive dyskinesia    Thyroid disease     Past Medical History, Surgical history, Social history, and Family history were reviewed and updated as appropriate.   Please see review of systems for further details on the patient's review from today.   Objective:   Physical Exam:  There were no vitals taken for this visit.  Physical Exam Constitutional:      General: She is not in acute distress. Musculoskeletal:        General: No deformity.  Neurological:     Mental Status: She is alert and oriented to person, place, and time.     Coordination: Coordination normal.  Psychiatric:        Attention and Perception: Attention and perception normal. She does not perceive auditory or visual hallucinations.        Mood and Affect: Mood normal. Mood is not anxious or depressed. Affect is not labile, blunt, angry or inappropriate.        Speech: Speech normal.        Behavior: Behavior normal.        Thought Content: Thought content normal. Thought content is not paranoid or delusional. Thought content does not include homicidal or suicidal ideation. Thought content does not include homicidal or suicidal plan.        Cognition and Memory: Cognition and memory normal.        Judgment: Judgment normal.     Comments: Insight intact     Lab Review:     Component Value Date/Time   NA 141 04/03/2022 1425   NA 142 10/17/2021 0000   K 4.0 04/03/2022 1425   CL 108 04/03/2022 1425   CO2 21 (L) 04/03/2022 1425   GLUCOSE 90 04/03/2022 1425   BUN 28 (H) 04/03/2022  1425   BUN 30 (A) 10/17/2021 0000   CREATININE 2.33 (H) 04/03/2022 1425   CREATININE 2.29 (H) 05/22/2021 1044   CALCIUM 9.7 04/03/2022 1425   PROT 7.0 12/10/2021 1044   ALBUMIN 4.4 12/10/2021 1044   AST 14 12/10/2021 1044   ALT 12 12/10/2021 1044   ALKPHOS 122 (H) 12/10/2021 1044   BILITOT 0.7 12/10/2021 1044   GFRNONAA 22 (L) 04/03/2022 1425   GFRAA 25 (L) 09/13/2019 1243       Component Value Date/Time   WBC 9.1 04/03/2022 1425   RBC 4.19 04/03/2022 1425   HGB 12.8 04/03/2022 1425   HCT 38.9 04/03/2022 1425   PLT 203 04/03/2022 1425   MCV 92.8 04/03/2022 1425   MCH 30.5 04/03/2022 1425   MCHC 32.9 04/03/2022 1425   RDW 13.1 04/03/2022 1425   LYMPHSABS 3.2 07/02/2021 1048   MONOABS 0.7 07/02/2021 1048   EOSABS 0.2 07/02/2021 1048   BASOSABS 0.1 07/02/2021 1048    Lithium Lvl  Date Value Ref Range Status  05/22/2021 0.7 0.6 - 1.2 mmol/L Final     No results found for: "PHENYTOIN", "PHENOBARB", "VALPROATE", "CBMZ"   .res Assessment: Plan:    Plan:  Lorazepam  BID for anxiety - may take one tablet extra for severe anxiety symptoms. Taking one at night routinely. Lamictal  hs Lamictal  every morning.  May restart Trazadone as needed  Consider low dose of Gabapentin.  NAC tablets BID - for obsessive thoughts, worry, and rumination - unable to take SSRI's  RTC weekly  Seeing Endocrinologist   Counseled patient regarding potential benefits, risks, and side effects of Lamictal to include potential risk of Stevens-Johnson syndrome. Advised patient to stop taking Lamictal and contact office immediately if rash develops and to seek urgent medical attention if rash is severe and/or spreading quickly.  There are no diagnoses linked to this encounter.   Please see After Visit Summary for patient specific instructions.  Future Appointments  Date Time Provider Department Center  07/03/2022  4:00 PM Dulce Sellar, NP [facility]  07/09/2022  1:40 PM  Ermalinda Joubert, Thereasa Solo, NP [facility] None  07/16/2022 12:00 PM Geroldine Esquivias, Thereasa Solo, NP [facility] None  07/23/2022 11:40 AM Swanson Farnell, Rene Kocher  Wilhemina Bonito, NP [facility] None  11/12/2022 11:50 AM Shamleffer, Konrad Dolores, MD [facility] None  11/14/2022 11:30 AM Van Clines, MD [facility] None  04/24/2023  2:00 PM [facility] ANNUAL WELLNESS VISIT 1 [facility]    No orders of the defined types were placed in this encounter.   -------------------------------

## 2022-07-03 NOTE — Progress Notes (Signed)
Patient ID: Tiffany [name], female    DOB: Jul 25, 1950, 72 y.o.   MRN: [id number]  Chief Complaint  Patient presents with   Urinary Frequency    Pt c/o urinary frequency, urgency and dysuria for 2 days.     HPI:      Urinary sx: started 2d ago, frequency, urgency & dysuria. Pt had been on Augmentin and finished this about 4 days ago. Denies any hematuria, nausea, fever, pelvic or back pain, no foul odor.     Assessment & Plan:  1. Urinary frequency - UA neg for UTI, will go ahead and send out for yeast, Advised pt on increased water intake and other urinary hygiene measures.  - POCT Urinalysis Dipstick - Urine cytology ancillary only  Subjective:    Outpatient Medications Prior to Visit  Medication Sig Dispense Refill   Acetylcysteine (N-ACETYL CYSTEINE) 600 MG CAPS 1 capsule Orally Twice a day     amLODipine (NORVASC) 5 MG tablet Take 1 tablet (5 mg total) by mouth daily. 90 tablet 1   atorvastatin (LIPITOR) 10 MG tablet TAKE 1 TABLET(10 MG) BY MOUTH DAILY 90 tablet 1   B Complex Vitamins (VITAMIN B COMPLEX) TABS 1 tablet Orally Once daily     betamethasone dipropionate 0.05 % cream 1 application Externally Twice a day     Cholecalciferol 50 MCG (2000 UT) TABS 1 tablet Orally Once a day for 30 day(s)     Golimumab (SIMPONI ARIA IV) Infusion every 8 weeks     lamoTRIgine (LAMICTAL) 100 MG tablet Take one tablet in the morning and take one tablet at lunch. (Patient taking differently: Take 50 mg by mouth daily.) 180 tablet 1   lamoTRIgine (LAMICTAL) 200 MG tablet Take 1 tablet (200 mg total) by mouth at bedtime. 90 tablet 3   levothyroxine (SYNTHROID) 50 MCG tablet Take 1 tablet (50 mcg total) by mouth daily. 30 tablet 6   LORazepam (ATIVAN) 1 MG tablet TAKE 1 TABLET(1 MG) BY MOUTH TWICE DAILY AS NEEDED 60 tablet 2   metroNIDAZOLE (METROGEL) 0.75 % gel Apply to face 1-2 times daily 45 g 1   Omega-3 Fatty Acids (FISH OIL) 1200 MG CAPS Take 1 capsule by mouth in the morning and at  bedtime.     Probiotic Product (ALIGN) 4 MG CAPS Take 1 capsule by mouth daily in the afternoon.     sodium bicarbonate 650 MG tablet Take 650 mg by mouth 2 (two) times daily.     No facility-administered medications prior to visit.   Past Medical History:  Diagnosis Date   Bipolar 1 disorder    Chronic diarrhea    loose stools twice a day on average for years   CKD stage G4/A1, 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 1.4 cm  Grade I invasive lobular, ER and PR +/Her--, Ki 67 <5% Tried anastrozole for one month, but developed suicidal idea   Osteoarthritis, knee 09/24/2019   Xray 09/2019   Parkinsonism    Drug induced   Polyposis of colon    Psoriatic arthritis    PTSD (post-traumatic stress disorder)    Secondary hyperparathyroidism    Tardive dyskinesia    Thyroid disease    Past Surgical History:  Procedure Laterality Date   ABDOMINAL HYSTERECTOMY  1987  CHOLECYSTECTOMY  1979   DILATION AND CURETTAGE OF UTERUS  1973   MASTECTOMY Bilateral 09/19/2014   TONSILLECTOMY  1970   URETERAL REIMPLANTION Bilateral 1974   Allergies  Allergen Reactions   Aripiprazole Other (See Comments)    Parkinsonism      Lactose Intolerance (Gi) Diarrhea   Methotrexate Other (See Comments)    Hair loss, severe stomatitis     Cefdinir Diarrhea    Other reaction(s): Diarrhea Yeast infection and fever; negative c diff   Etanercept Other (See Comments)    Headaches     Exemestane Other (See Comments)    Suicidal thoughts with medication     Fluoxetine Other (See Comments)    Parkinsonism   Methylprednisolone Sodium Succ Other (See Comments)    Agitated mania   Epinephrine Palpitations    tachycardia    Nitrofurantoin Other (See Comments)     GI upset from 2012      Objective:     Physical Exam Vitals and nursing note reviewed.  Constitutional:      Appearance: Normal appearance.  Cardiovascular:     Rate and Rhythm: Normal rate and regular rhythm.  Pulmonary:     Effort: Pulmonary effort is normal.     Breath sounds: Normal breath sounds.  Musculoskeletal:        General: Normal range of motion.  Skin:    General: Skin is warm and dry.  Neurological:     Mental Status: She is alert.  Psychiatric:        Mood and Affect: Mood normal.        Behavior: Behavior normal.    BP (!) 146/76 (BP Location: Left Arm, Patient Position: Sitting, Cuff Size: Large)   Pulse (!) 51   Temp 97.7 F (36.5 C) (Temporal)   Ht  (1.626 m)   Wt 187 lb 8 oz (85 kg)   SpO2 99%   BMI 32.18 kg/m  Wt Readings from Last 3 Encounters:  07/03/22 187 lb 8 oz (85 kg)  06/21/22 183 lb 8 oz (83.2 kg)  05/30/22 189 lb (85.7 kg)       Dulce Sellar, NP

## 2022-07-03 NOTE — Telephone Encounter (Signed)
Patient dropped off document Handicap Placard, to be filled out by provider. Patient requested to send it via Call Patient to pick up Document is located in providers tray at front office.Please advise at Mobile [phone] (mobile)

## 2022-07-04 ENCOUNTER — Telehealth: Payer: Self-pay | Admitting: Physician Assistant

## 2022-07-04 NOTE — Telephone Encounter (Signed)
Patient returned call. Requests to be called. 

## 2022-07-04 NOTE — Telephone Encounter (Signed)
I spoke with the pt and she reports that since her thyroid has "improved".... she has had improvement in her symptoms.. she is not as SOB with exertion and and not having any palpitations... but she was still a bit short winded on the phone with a little chest discomfort".. she does not want any aggressive testing if not needed but if Dr Tenny Craw feels like she needs it she will agree.   I will forward to Dr Tenny Craw for review.

## 2022-07-05 ENCOUNTER — Ambulatory Visit (INDEPENDENT_AMBULATORY_CARE_PROVIDER_SITE_OTHER): Payer: Medicare Other | Admitting: Adult Health

## 2022-07-05 ENCOUNTER — Encounter: Payer: Self-pay | Admitting: Adult Health

## 2022-07-05 DIAGNOSIS — F5105 Insomnia due to other mental disorder: Secondary | ICD-10-CM | POA: Diagnosis not present

## 2022-07-05 DIAGNOSIS — F319 Bipolar disorder, unspecified: Secondary | ICD-10-CM | POA: Diagnosis not present

## 2022-07-05 DIAGNOSIS — F411 Generalized anxiety disorder: Secondary | ICD-10-CM | POA: Diagnosis not present

## 2022-07-05 DIAGNOSIS — F4310 Post-traumatic stress disorder, unspecified: Secondary | ICD-10-CM | POA: Diagnosis not present

## 2022-07-05 DIAGNOSIS — F99 Mental disorder, not otherwise specified: Secondary | ICD-10-CM

## 2022-07-05 LAB — URINE CYTOLOGY ANCILLARY ONLY
Bacterial Vaginitis-Urine: NEGATIVE
Candida Urine: POSITIVE — AB

## 2022-07-05 NOTE — Progress Notes (Signed)
Tiffany [name] [id number] [birth year] 72 y.o.  Subjective:   Patient ID:  Tiffany [name] is a 72 y.o. (DOB [birth year]) female.  Chief Complaint: No chief complaint on file.   HPI Tiffany [name] presents to the office today for follow-up of PTSD, insomnia, GAD and  BPD 1.  Accompanied by husband.  Describes mood today as "not so good". Pleasant. Reports tearfulness - tearful throughout interview. Mood symptoms - reports increased depression anxiety and irritability. Reports worry, rumination, and over thinking.  Getting overwhelmed easily. Reports racing thoughts. Mood is variable - "up and down".  Stating "I'm not doing good". Some concerns mood instability related to thyroid changes over the past month. Saw PCP yesterday for possible UTI - negative screening. Feels like medications are helpful. Family supportive. Varying interest and motivation. Taking medications as prescribed. Working with therapist - Tiffany [name].  Energy levels lower. Active, working towards a regular exercise routine. Enjoys some usual interests and activities. Married. Lives with husband their daughter. Talking to family and friends.  Appetite adequate. Weight fluctuates. Sleep is variable. Taking a long time to get to sleep.  Focus and concentration varies. Completing tasks. Managing some aspects of household. Retired.  Denies SI or HI.  Denies AH or VH  Denies self harm. Denies substance use.  Previous medications: Celexa, Zyprexa, Tegretol, Depakote, Serzone, Topamax, Seroquel, Effexor, Lexapro, Desipramine, Neurontin, Abilify, Geodon, Propanolol, Cymbalta, Cogentin, Trihexyphenadyl, Sinmmet, Provigil, Selegiline, Requip, Amantadine, Prozac, Mirapex, Azilect, Metoclopramide, Baclofen, Artane, Namenda, Latuda, Lithium,    GAD-7    Flowsheet Row Office Visit from 05/06/2022 in [facility] from 04/25/2022 in [facility] from 04/10/2022 in [facility] from 02/16/2021 in [facility] from 05/14/2019 in [facility]  Total GAD-7 Score 4 9 17 13 21       Mini-Mental    Flowsheet Row Office Visit from 06/07/2019 in [facility]  Total Score (max 30 points ) 29      PHQ2-9    Flowsheet Row Office Visit from 05/06/2022 in [facility] from 04/25/2022 in [facility] Clinical Support from 04/15/2022 in [facility] from 04/10/2022 in [facility] Coordination from 11/30/2021 in [facility] Coordination  PHQ-2 Total Score 0 0 0 1 0  PHQ-9 Total Score 0 6 0 3 --      Flowsheet Row Clinical Support from 04/15/2022 in [facility] ED from 04/03/2022 in [facility] Emergency Department at [facility] ED from 11/26/2021 in [facility] Emergency Department at [facility]  C-SSRS RISK CATEGORY No Risk No Risk No Risk        Review of Systems:  Review of Systems  Musculoskeletal:  Negative for gait problem.  Neurological:  Negative for tremors.  Psychiatric/Behavioral:         Please refer to HPI    Medications: I have reviewed the patient's current medications.  Current Outpatient Medications  Medication Sig Dispense Refill   Acetylcysteine (N-ACETYL CYSTEINE) 600 MG CAPS 1 capsule Orally Twice a day     amLODipine (NORVASC) 5 MG tablet Take 1 tablet (5 mg total) by mouth daily. 90 tablet 1   atorvastatin (LIPITOR) 10 MG tablet TAKE 1 TABLET(10 MG) BY MOUTH DAILY 90 tablet 1   B Complex Vitamins (VITAMIN B COMPLEX) TABS 1 tablet Orally Once daily  betamethasone dipropionate 0.05 % cream 1 application Externally Twice a day     Cholecalciferol 50 MCG (2000 UT) TABS 1 tablet Orally Once a day for 30 day(s)     Golimumab (SIMPONI ARIA IV) Infusion  every 8 weeks     lamoTRIgine (LAMICTAL) 100 MG tablet Take one tablet in the morning and take one tablet at lunch. (Patient taking differently: Take 50 mg by mouth daily.) 180 tablet 1   lamoTRIgine (LAMICTAL) 200 MG tablet Take 1 tablet (200 mg total) by mouth at bedtime. 90 tablet 3   levothyroxine (SYNTHROID) 50 MCG tablet Take 1 tablet (50 mcg total) by mouth daily. 30 tablet 6   LORazepam (ATIVAN) 1 MG tablet TAKE 1 TABLET(1 MG) BY MOUTH TWICE DAILY AS NEEDED 60 tablet 2   metroNIDAZOLE (METROGEL) 0.75 % gel Apply to face 1-2 times daily 45 g 1   Omega-3 Fatty Acids (FISH OIL) 1200 MG CAPS Take 1 capsule by mouth in the morning and at bedtime.     Probiotic Product (ALIGN) 4 MG CAPS Take 1 capsule by mouth daily in the afternoon.     sodium bicarbonate 650 MG tablet Take 650 mg by mouth 2 (two) times daily.     No current facility-administered medications for this visit.    Medication Side Effects: None  Allergies:  Allergies  Allergen Reactions   Aripiprazole Other (See Comments)    Parkinsonism      Lactose Intolerance (Gi) Diarrhea   Methotrexate Other (See Comments)    Hair loss, severe stomatitis     Cefdinir Diarrhea    Other reaction(s): Diarrhea Yeast infection and fever; negative c diff   Etanercept Other (See Comments)    Headaches     Exemestane Other (See Comments)    Suicidal thoughts with medication     Fluoxetine Other (See Comments)    Parkinsonism   Methylprednisolone Sodium Succ Other (See Comments)    Agitated mania   Epinephrine Palpitations    tachycardia    Nitrofurantoin Other (See Comments)     GI upset from 2012    Past Medical History:  Diagnosis Date   Bipolar 1 disorder (HCC)    Chronic diarrhea    loose stools twice a day on average for years   CKD stage G4/A1, 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 1.4 cm  Grade I invasive lobular, ER and PR +/Her--, Ki 67 <5% Tried anastrozole for one month, but developed suicidal idea   Osteoarthritis, knee 09/24/2019   Xray 09/2019   Parkinsonism    Drug induced   Polyposis of colon    Psoriatic arthritis (HCC)    PTSD (post-traumatic stress disorder)    Secondary hyperparathyroidism (HCC)    Tardive dyskinesia    Thyroid disease     Past Medical History, Surgical history, Social history, and Family history were reviewed and updated as appropriate.   Please see review of systems for further details on the patient's review from today.   Objective:   Physical Exam:  There were no vitals taken for this visit.  Physical Exam Constitutional:      General: She is not in acute distress. Musculoskeletal:        General: No deformity.  Neurological:     Mental Status:  She is alert and oriented to person, place, and time.     Coordination: Coordination normal.  Psychiatric:        Attention and Perception: Attention and perception normal. She does not perceive auditory or visual hallucinations.        Mood and Affect: Mood normal. Mood is not anxious or depressed. Affect is not labile, blunt, angry or inappropriate.        Speech: Speech normal.        Behavior: Behavior normal.        Thought Content: Thought content normal. Thought content is not paranoid or delusional. Thought content does not include homicidal or suicidal ideation. Thought content does not include homicidal or suicidal plan.        Cognition and Memory: Cognition and memory normal.        Judgment: Judgment normal.     Comments: Insight intact     Lab Review:     Component Value Date/Time   NA 141 04/03/2022 1425   NA 142 10/17/2021 0000   K 4.0 04/03/2022 1425   CL 108 04/03/2022 1425   CO2 21 (L) 04/03/2022 1425   GLUCOSE 90 04/03/2022 1425   BUN 28 (H) 04/03/2022 1425   BUN 30 (A)  10/17/2021 0000   CREATININE 2.33 (H) 04/03/2022 1425   CREATININE 2.29 (H) 05/22/2021 1044   CALCIUM 9.7 04/03/2022 1425   PROT 7.0 12/10/2021 1044   ALBUMIN 4.4 12/10/2021 1044   AST 14 12/10/2021 1044   ALT 12 12/10/2021 1044   ALKPHOS 122 (H) 12/10/2021 1044   BILITOT 0.7 12/10/2021 1044   GFRNONAA 22 (L) 04/03/2022 1425   GFRAA 25 (L) 09/13/2019 1243       Component Value Date/Time   WBC 9.1 04/03/2022 1425   RBC 4.19 04/03/2022 1425   HGB 12.8 04/03/2022 1425   HCT 38.9 04/03/2022 1425   PLT 203 04/03/2022 1425   MCV 92.8 04/03/2022 1425   MCH 30.5 04/03/2022 1425   MCHC 32.9 04/03/2022 1425   RDW 13.1 04/03/2022 1425   LYMPHSABS 3.2 07/02/2021 1048   MONOABS 0.7 07/02/2021 1048   EOSABS 0.2 07/02/2021 1048   BASOSABS 0.1 07/02/2021 1048    Lithium Lvl  Date Value Ref Range Status  05/22/2021 0.7 0.6 - 1.2 mmol/L Final     No results found for: "PHENYTOIN", "PHENOBARB", "VALPROATE", "CBMZ"   .res Assessment: Plan:    Plan:  Lorazepam 1mg  BID for anxiety - may take one tablet extra for severe anxiety symptoms. Taking one at night routinely. Lamictal 200mg  hs Lamictal 50mg  every morning.  May use Trazadone as needed  NAC tablets BID - for obsessive thoughts, worry, and rumination - unable to take SSRI's  RTC weekly  Seeing Endocrinologist   Counseled patient regarding potential benefits, risks, and side effects of Lamictal to include potential risk of Stevens-Johnson syndrome. Advised patient to stop taking Lamictal and contact office immediately if rash develops and to seek urgent medical attention if rash is severe and/or spreading quickly.  There are no diagnoses linked to this encounter.   Please see After Visit Summary for patient specific instructions.  Future Appointments  Date Time Provider Department Center  07/09/2022  1:40 PM Link Burgeson, Thereasa Solo, NP CP-CP None  07/16/2022 12:00 PM Courtnay Petrilla, Thereasa Solo, NP CP[facility] None  07/23/2022  11:40 AM Treveon Bourcier, Thereasa Solo, NP CP-CP None  07/31/2022 12:00 PM Tinsley Lomas, Thereasa Solo, NP CP-CP None  08/07/2022  1:40 PM Destin Vinsant, Thereasa Solo, NP  CP-CP None  11/12/2022 11:50 AM Shamleffer, Konrad Dolores, MD [facility] None  11/14/2022 11:30 AM Van Clines, MD [facility] None  04/24/2023  2:00 PM [facility] ANNUAL WELLNESS VISIT 1 [facility] PEC    No orders of the defined types were placed in this encounter.   -------------------------------

## 2022-07-05 NOTE — Telephone Encounter (Signed)
Spoke to pt told her parking placard form is completed. I will put it at the front desk for you to pick up. Pt verbalized understanding.

## 2022-07-05 NOTE — Telephone Encounter (Signed)
See other message

## 2022-07-08 ENCOUNTER — Encounter: Payer: Self-pay | Admitting: Family

## 2022-07-08 ENCOUNTER — Encounter: Payer: Self-pay | Admitting: Physician Assistant

## 2022-07-08 ENCOUNTER — Encounter: Payer: Self-pay | Admitting: Internal Medicine

## 2022-07-08 DIAGNOSIS — B3731 Acute candidiasis of vulva and vagina: Secondary | ICD-10-CM

## 2022-07-08 NOTE — Telephone Encounter (Signed)
FYI, see message. 

## 2022-07-09 ENCOUNTER — Encounter: Payer: Self-pay | Admitting: Adult Health

## 2022-07-09 ENCOUNTER — Encounter: Payer: Self-pay | Admitting: Physician Assistant

## 2022-07-09 ENCOUNTER — Ambulatory Visit (INDEPENDENT_AMBULATORY_CARE_PROVIDER_SITE_OTHER): Payer: Medicare Other | Admitting: Adult Health

## 2022-07-09 ENCOUNTER — Ambulatory Visit (INDEPENDENT_AMBULATORY_CARE_PROVIDER_SITE_OTHER): Payer: Medicare Other | Admitting: Physician Assistant

## 2022-07-09 ENCOUNTER — Telehealth: Payer: Self-pay | Admitting: Physician Assistant

## 2022-07-09 VITALS — BP 124/86 | HR 61 | Temp 97.8°F | Ht 64.0 in | Wt 185.2 lb

## 2022-07-09 DIAGNOSIS — F99 Mental disorder, not otherwise specified: Secondary | ICD-10-CM

## 2022-07-09 DIAGNOSIS — F319 Bipolar disorder, unspecified: Secondary | ICD-10-CM | POA: Diagnosis not present

## 2022-07-09 DIAGNOSIS — J029 Acute pharyngitis, unspecified: Secondary | ICD-10-CM

## 2022-07-09 DIAGNOSIS — F4310 Post-traumatic stress disorder, unspecified: Secondary | ICD-10-CM

## 2022-07-09 DIAGNOSIS — F5105 Insomnia due to other mental disorder: Secondary | ICD-10-CM

## 2022-07-09 DIAGNOSIS — F411 Generalized anxiety disorder: Secondary | ICD-10-CM | POA: Diagnosis not present

## 2022-07-09 DIAGNOSIS — B3731 Acute candidiasis of vulva and vagina: Secondary | ICD-10-CM

## 2022-07-09 LAB — POCT RAPID STREP A (OFFICE): Rapid Strep A Screen: POSITIVE — AB

## 2022-07-09 MED ORDER — AMOXICILLIN 500 MG PO CAPS: 500.0000 mg | ORAL_CAPSULE | Freq: Two times a day (BID) | ORAL | 0 refills | Status: DC

## 2022-07-09 MED ORDER — FLUCONAZOLE 150 MG PO TABS
ORAL_TABLET | ORAL | 0 refills | Status: DC
Start: 2022-07-09 — End: 2022-07-15

## 2022-07-09 NOTE — Patient Instructions (Signed)
It was great to see you!  Start amoxicillin for your strep throat  If you feel like you need for diflucan - reach out  If any worsening symptoms or concerns, please reach out  Keep an eye on your blood pressure as well   Take care,  Jarold Motto PA-C

## 2022-07-09 NOTE — Telephone Encounter (Signed)
Please call pt and tell her she needs an appointment, unfortunately Lelon Mast is full today. If there is openings she can be seen tomorrow.

## 2022-07-09 NOTE — Progress Notes (Signed)
Tiffany [name] [id number] [birth year] 72 y.o.  Subjective:   Patient ID:  Tiffany [name] is a 72 y.o. (DOB 1950-11-25) female.  Chief Complaint: No chief complaint on file.   HPI Tiffany [name] presents to the office today for follow-up of PTSD, insomnia, GAD and  BPD 1.  Accompanied by husband.  Describes mood today as "lower". Pleasant. Reports tearfulness. Mood symptoms - reports depression anxiety and irritability. Reports worry, rumination, and over thinking. Getting overwhelmed at times. Reports racing thoughts - "mind always racing". Mood is variable - "up and down". Stating "I'm not feeling well". Recently diagnosed with strep throat and UTI - taking antibiotics. Feels like medications are helpful. Family supportive. Varying interest and motivation. Taking medications as prescribed. Working with therapist - [name].  Energy levels lower. Active, working towards a regular exercise routine. Enjoys some usual interests and activities. Married. Lives with husband their daughter. Talking to family and friends.  Appetite decreased. Weight fluctuates. Sleep has improved. Averages 5 hours of solid sleep then up and down - Trazadone helping. Focus and concentration varies. Completing tasks. Managing some aspects of household. Retired.  Denies SI or HI.  Denies AH or VH  Denies self harm. Denies substance use.  Previous medications: Celexa, Zyprexa, Tegretol, Depakote, Serzone, Topamax, Seroquel, Effexor, Lexapro, Desipramine, Neurontin, Abilify, Geodon, Propanolol, Cymbalta, Cogentin, Trihexyphenadyl, Sinmmet, Provigil, Selegiline, Requip, Amantadine, Prozac, Mirapex, Azilect, Metoclopramide, Baclofen, Artane, Namenda, Latuda, Lithium,    GAD-7    Flowsheet Row Office Visit from 05/06/2022 in [facility] from 04/25/2022 in [facility] from 04/10/2022 in [facility] from 02/16/2021  in [place] [facility] from 05/14/2019 in [facility]  Total GAD-7 Score 4 9 17 13 21       Mini-Mental    Flowsheet Row Office Visit from 06/07/2019 in [facility] Neurology  Total Score (max 30 points ) 29      PHQ2-9    Flowsheet Row Office Visit from 05/06/2022 in [facility] [facility] from 04/25/2022 in [facility] Clinical Support from 04/15/2022 in [facility] from 04/10/2022 in [facility] Coordination from 11/30/2021 in [facility] Coordination  PHQ-2 Total Score 0 0 0 1 0  PHQ-9 Total Score 0 6 0 3 --      Flowsheet Row Clinical Support from 04/15/2022 in [facility] ED from 04/03/2022 in [facility] Emergency Department at [facility] ED from 11/26/2021 in [facility] Emergency Department at [facility]  C-SSRS RISK CATEGORY No Risk No Risk No Risk        Review of Systems:  Review of Systems  Musculoskeletal:  Negative for gait problem.  Neurological:  Negative for tremors.  Psychiatric/Behavioral:         Please refer to HPI    Medications: I have reviewed the patient's current medications.  Current Outpatient Medications  Medication Sig Dispense Refill   Acetylcysteine (N-ACETYL CYSTEINE) 600 MG CAPS 1 capsule Orally Twice a day     amLODipine (NORVASC) 5 MG tablet Take 1 tablet (5 mg total) by mouth daily. 90 tablet 1   amoxicillin (AMOXIL) 500 MG capsule Take 1 capsule (500 mg total) by mouth 2 (two) times daily for 10 days. 20 capsule 0   atorvastatin (LIPITOR) 10 MG tablet TAKE 1 TABLET(10 MG) BY MOUTH DAILY 90 tablet 1  B Complex Vitamins (VITAMIN B COMPLEX) TABS 1 tablet Orally Once daily     betamethasone dipropionate 0.05 % cream 1 application Externally Twice a day     Cholecalciferol 50 MCG (2000 UT) TABS 1 tablet Orally  Once a day for 30 day(s)     fluconazole (DIFLUCAN) 150 MG tablet Take 1 pill today and the 2nd pill in 3 days. 2 tablet 0   Golimumab (SIMPONI ARIA IV) Infusion every 8 weeks     lamoTRIgine (LAMICTAL) 100 MG tablet Take one tablet in the morning and take one tablet at lunch. (Patient taking differently: Take 50 mg by mouth daily.) 180 tablet 1   lamoTRIgine (LAMICTAL) 200 MG tablet Take 1 tablet (200 mg total) by mouth at bedtime. 90 tablet 3   levothyroxine (SYNTHROID) 50 MCG tablet Take 1 tablet (50 mcg total) by mouth daily. 30 tablet 6   LORazepam (ATIVAN) 1 MG tablet TAKE 1 TABLET(1 MG) BY MOUTH TWICE DAILY AS NEEDED 60 tablet 2   metroNIDAZOLE (METROGEL) 0.75 % gel Apply to face 1-2 times daily 45 g 1   Omega-3 Fatty Acids (FISH OIL) 1200 MG CAPS Take 1 capsule by mouth in the morning and at bedtime.     Probiotic Product (ALIGN) 4 MG CAPS Take 1 capsule by mouth daily in the afternoon.     sodium bicarbonate 650 MG tablet Take 650 mg by mouth 2 (two) times daily.     traZODone (DESYREL) 50 MG tablet Take 50 mg by mouth at bedtime as needed for sleep.     No current facility-administered medications for this visit.    Medication Side Effects: None  Allergies:  Allergies  Allergen Reactions   Aripiprazole Other (See Comments)    Parkinsonism      Lactose Intolerance (Gi) Diarrhea   Methotrexate Other (See Comments)    Hair loss, severe stomatitis     Cefdinir Diarrhea    Other reaction(s): Diarrhea Yeast infection and fever; negative c diff   Etanercept Other (See Comments)    Headaches     Exemestane Other (See Comments)    Suicidal thoughts with medication     Fluoxetine Other (See Comments)    Parkinsonism   Methylprednisolone Sodium Succ Other (See Comments)    Agitated mania   Epinephrine Palpitations    tachycardia    Nitrofurantoin Other (See Comments)     GI upset from 2012    Past Medical History:  Diagnosis Date   Bipolar 1 disorder (HCC)     Chronic diarrhea    loose stools twice a day on average for years   CKD stage G4/A1, 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 1.4 cm  Grade I invasive lobular, ER and PR +/Her--, Ki 67 <5% Tried anastrozole for one month, but developed suicidal idea   Osteoarthritis, knee 09/24/2019   Xray 09/2019   Parkinsonism    Drug induced   Polyposis of colon    Psoriatic arthritis (HCC)    PTSD (post-traumatic stress disorder)    Secondary hyperparathyroidism (HCC)    Tardive dyskinesia    Thyroid disease     Past Medical History, Surgical history, Social history, and Family history were reviewed and updated as appropriate.   Please see review of systems for further details  on the patient's review from today.   Objective:   Physical Exam:  There were no vitals taken for this visit.  Physical Exam Constitutional:      General: She is not in acute distress. Musculoskeletal:        General: No deformity.  Neurological:     Mental Status: She is alert and oriented to person, place, and time.     Coordination: Coordination normal.  Psychiatric:        Attention and Perception: Attention and perception normal. She does not perceive auditory or visual hallucinations.        Mood and Affect: Mood normal. Mood is not anxious or depressed. Affect is not labile, blunt, angry or inappropriate.        Speech: Speech normal.        Behavior: Behavior normal.        Thought Content: Thought content normal. Thought content is not paranoid or delusional. Thought content does not include homicidal or suicidal ideation. Thought content does not include homicidal or suicidal plan.        Cognition and Memory: Cognition and memory normal.        Judgment: Judgment normal.     Comments: Insight intact      Lab Review:     Component Value Date/Time   NA 141 04/03/2022 1425   NA 142 10/17/2021 0000   K 4.0 04/03/2022 1425   CL 108 04/03/2022 1425   CO2 21 (L) 04/03/2022 1425   GLUCOSE 90 04/03/2022 1425   BUN 28 (H) 04/03/2022 1425   BUN 30 (A) 10/17/2021 0000   CREATININE 2.33 (H) 04/03/2022 1425   CREATININE 2.29 (H) 05/22/2021 1044   CALCIUM 9.7 04/03/2022 1425   PROT 7.0 12/10/2021 1044   ALBUMIN 4.4 12/10/2021 1044   AST 14 12/10/2021 1044   ALT 12 12/10/2021 1044   ALKPHOS 122 (H) 12/10/2021 1044   BILITOT 0.7 12/10/2021 1044   GFRNONAA 22 (L) 04/03/2022 1425   GFRAA 25 (L) 09/13/2019 1243       Component Value Date/Time   WBC 9.1 04/03/2022 1425   RBC 4.19 04/03/2022 1425   HGB 12.8 04/03/2022 1425   HCT 38.9 04/03/2022 1425   PLT 203 04/03/2022 1425   MCV 92.8 04/03/2022 1425   MCH 30.5 04/03/2022 1425   MCHC 32.9 04/03/2022 1425   RDW 13.1 04/03/2022 1425   LYMPHSABS 3.2 07/02/2021 1048   MONOABS 0.7 07/02/2021 1048   EOSABS 0.2 07/02/2021 1048   BASOSABS 0.1 07/02/2021 1048    Lithium Lvl  Date Value Ref Range Status  05/22/2021 0.7 0.6 - 1.2 mmol/L Final     No results found for: "PHENYTOIN", "PHENOBARB", "VALPROATE", "CBMZ"   .res Assessment: Plan:    Plan:  Lorazepam 1mg  BID for anxiety - may take one tablet extra for severe anxiety symptoms. Taking one at night routinely. Lamictal 200mg  hs  D/C Lamictal 50mg  every morning - possible irritability.  Using Trazadone as needed  NAC tablets BID - for obsessive thoughts, worry, and rumination - unable to take SSRI's  RTC weekly  Seeing Endocrinologist for thyroid  Counseled patient regarding potential benefits, risks, and side effects of Lamictal to include potential risk of Stevens-Johnson syndrome. Advised patient to stop taking Lamictal and contact office immediately if rash develops and to seek urgent medical attention if rash is severe and/or spreading quickly.  There are no  diagnoses linked to this encounter.   Please see After Visit Summary for  patient specific instructions.  Future Appointments  Date Time Provider Department Center  07/16/2022 12:00 PM Erinn Huskins, Thereasa Solo, NP CP-CP None  07/23/2022 11:40 AM Jaleil Renwick, Thereasa Solo, NP CP-CP None  07/31/2022 12:00 PM Vadis Slabach, Thereasa Solo, NP CP-CP None  08/07/2022  1:40 PM Angela Platner, Thereasa Solo, NP CP-CP None  11/12/2022 11:50 AM Shamleffer, Konrad Dolores, MD [facility] None  11/14/2022 11:30 AM Van Clines, MD [facility] None  04/24/2023  2:00 PM [facility] ANNUAL WELLNESS VISIT 1 [facility] PEC    No orders of the defined types were placed in this encounter.   -------------------------------

## 2022-07-09 NOTE — Progress Notes (Signed)
Tiffany [name] is a 72 y.o. female here for a new problem.  History of Present Illness:   Chief Complaint  Patient presents with   Sore Throat    Pt c/o sore throat and dry cough, started 4 days ago.    Sore throat:  She complains of sore throat and dry cough for about 4 days.  Her husband has strep and is on his last day of antibiotics.  She was recently on antibiotics for sinus infection for 5 days.  Denies: fevers, chills, n/v/d, severe ear pain  Yeast infection 4-5 days after finishing her antibiotics she got a yeast infection.  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 1.4 cm  Grade I invasive lobular, ER and PR +/Her--, Ki 67 <5% Tried anastrozole for one month, but developed suicidal idea   Osteoarthritis, knee 09/24/2019   Xray 09/2019   Parkinsonism    Drug induced   Polyposis of colon    Psoriatic arthritis (HCC)    PTSD (post-traumatic stress disorder)    Secondary hyperparathyroidism (HCC)    Tardive dyskinesia    Thyroid disease      Social History   Tobacco Use   Smoking status: Never   Smokeless tobacco: Never  Vaping Use   Vaping Use: Never used  Substance Use Topics   Alcohol use: Never   Drug use: Never    Past Surgical History:  Procedure Laterality Date   ABDOMINAL HYSTERECTOMY  1987   CHOLECYSTECTOMY  1979   DILATION AND CURETTAGE OF UTERUS  1973   MASTECTOMY Bilateral 09/19/2014   TONSILLECTOMY  1970   URETERAL REIMPLANTION Bilateral 1974    Family History  Problem Relation Age of Onset   Lymphoma Mother 61   Lymphoma  Sister 40   Breast cancer Sister 48   Colon polyps Sister    Lung cancer Sister        former smoker   Stroke Maternal Grandfather    Diabetes Paternal Grandfather    Colon cancer Neg Hx    Esophageal cancer Neg Hx    Stomach cancer Neg Hx    Rectal cancer Neg Hx     Allergies  Allergen Reactions   Aripiprazole Other (See Comments)    Parkinsonism      Lactose Intolerance (Gi) Diarrhea   Methotrexate Other (See Comments)    Hair loss, severe stomatitis     Cefdinir Diarrhea    Other reaction(s): Diarrhea Yeast infection and fever; negative c diff   Etanercept Other (See Comments)    Headaches     Exemestane Other (See Comments)    Suicidal thoughts with medication     Fluoxetine Other (See Comments)    Parkinsonism   Methylprednisolone Sodium Succ Other (See Comments)    Agitated mania   Epinephrine Palpitations    tachycardia    Nitrofurantoin Other (See Comments)     GI upset from 2012    Current Medications:   Current Outpatient Medications:    Acetylcysteine (N-ACETYL CYSTEINE) 600  MG CAPS, 1 capsule Orally Twice a day, Disp: , Rfl:    amLODipine (NORVASC) 5 MG tablet, Take 1 tablet (5 mg total) by mouth daily., Disp: 90 tablet, Rfl: 1   amoxicillin (AMOXIL) 500 MG capsule, Take 1 capsule (500 mg total) by mouth 2 (two) times daily for 10 days., Disp: 20 capsule, Rfl: 0   atorvastatin (LIPITOR) 10 MG tablet, TAKE 1 TABLET(10 MG) BY MOUTH DAILY, Disp: 90 tablet, Rfl: 1   B Complex Vitamins (VITAMIN B COMPLEX) TABS, 1 tablet Orally Once daily, Disp: , Rfl:    betamethasone dipropionate 0.05 % cream, 1 application Externally Twice a day, Disp: , Rfl:    Cholecalciferol 50 MCG (2000 UT) TABS, 1 tablet Orally Once a day for 30 day(s), Disp: , Rfl:    fluconazole (DIFLUCAN) 150 MG tablet, Take 1 pill today and the 2nd pill in 3 days., Disp: 2 tablet, Rfl: 0   Golimumab (SIMPONI ARIA IV), Infusion every 8 weeks, Disp: , Rfl:    lamoTRIgine (LAMICTAL) 100 MG  tablet, Take one tablet in the morning and take one tablet at lunch. (Patient taking differently: Take 50 mg by mouth daily.), Disp: 180 tablet, Rfl: 1   lamoTRIgine (LAMICTAL) 200 MG tablet, Take 1 tablet (200 mg total) by mouth at bedtime., Disp: 90 tablet, Rfl: 3   levothyroxine (SYNTHROID) 50 MCG tablet, Take 1 tablet (50 mcg total) by mouth daily., Disp: 30 tablet, Rfl: 6   LORazepam (ATIVAN) 1 MG tablet, TAKE 1 TABLET(1 MG) BY MOUTH TWICE DAILY AS NEEDED, Disp: 60 tablet, Rfl: 2   metroNIDAZOLE (METROGEL) 0.75 % gel, Apply to face 1-2 times daily, Disp: 45 g, Rfl: 1   Omega-3 Fatty Acids (FISH OIL) 1200 MG CAPS, Take 1 capsule by mouth in the morning and at bedtime., Disp: , Rfl:    Probiotic Product (ALIGN) 4 MG CAPS, Take 1 capsule by mouth daily in the afternoon., Disp: , Rfl:    sodium bicarbonate 650 MG tablet, Take 650 mg by mouth 2 (two) times daily., Disp: , Rfl:    traZODone (DESYREL) 50 MG tablet, Take 50 mg by mouth at bedtime as needed for sleep., Disp: , Rfl:    Review of Systems:   Review of Systems  HENT:  Positive for sore throat.   Respiratory:  Positive for cough (dry).     Vitals:   Vitals:   07/09/22 1027 07/09/22 1059  BP: (!) 140/90 124/86  Pulse: 61   Temp: 97.8 F (36.6 C)   TempSrc: Temporal   SpO2: 99%   Weight: 185 lb 4 oz (84 kg)   Height: 5\' 4"  (1.626 m)      Body mass index is 31.8 kg/m.  Physical Exam:   Physical Exam Vitals and nursing note reviewed.  Constitutional:      General: She is not in acute distress.    Appearance: She is well-developed. She is not ill-appearing or toxic-appearing.  HENT:     Head: Normocephalic and atraumatic.     Right Ear: Tympanic membrane, ear canal and external ear normal. Tympanic membrane is not erythematous, retracted or bulging.     Left Ear: Tympanic membrane, ear canal and external ear normal. Tympanic membrane is not erythematous, retracted or bulging.     Nose: Nose normal.     Right Sinus: No  maxillary sinus tenderness or frontal sinus tenderness.     Left Sinus: No maxillary sinus tenderness or frontal sinus tenderness.     Mouth/Throat:  Pharynx: Uvula midline. Posterior oropharyngeal erythema present.     Tonsils: Tonsillar exudate present.  Eyes:     General: Lids are normal.     Conjunctiva/sclera: Conjunctivae normal.  Neck:     Trachea: Trachea normal.  Cardiovascular:     Rate and Rhythm: Normal rate and regular rhythm.     Pulses: Normal pulses.     Heart sounds: Normal heart sounds, S1 normal and S2 normal.  Pulmonary:     Effort: Pulmonary effort is normal.     Breath sounds: Normal breath sounds. No decreased breath sounds, wheezing, rhonchi or rales.  Lymphadenopathy:     Cervical: No cervical adenopathy.  Skin:    General: Skin is warm and dry.  Neurological:     Mental Status: She is alert.     GCS: GCS eye subscore is 4. GCS verbal subscore is 5. GCS motor subscore is 6.  Psychiatric:        Speech: Speech normal.        Behavior: Behavior normal. Behavior is cooperative.    Results for orders placed or performed in visit on 07/09/22  POCT rapid strep A  Result Value Ref Range   Rapid Strep A Screen Positive (A) Negative    Assessment and Plan:   Sore throat No red flags on exam.  Strep test is positive in the office today. Will initiate amoxicillin 500 mg BID x 10 days per orders. Discussed taking medications as prescribed. Reviewed return precautions including worsening fever, SOB, worsening cough or other concerns. Push fluids and rest. I recommend that patient follow-up if symptoms worsen or persist despite treatment x 7-10 days, sooner if needed.   Vaginal yeast infection She is on appropriate treatment for this -- started this AM No red flags discussed If symptoms return after completion of amoxicillin, I have asked her to send [place] a mychart message and we can send in additional treatment If symptoms worsen, will need pelvic  exam    I,Rachel Rivera,acting as a scribe for Jarold Motto, PA.,have documented all relevant documentation on the behalf of Jarold Motto, PA,as directed by  Jarold Motto, PA while in the presence of Jarold Motto, [place].  I, Jarold Motto, [place], have reviewed all documentation for this visit. The documentation on 07/09/22 for the exam, diagnosis, procedures, and orders are all accurate and complete.   Jarold Motto, PA-C

## 2022-07-09 NOTE — Telephone Encounter (Signed)
Patient called stating she was experiencing sore throat and dry cough. Pt stated the last couple of times she has been sick, she hasn't been able to see PCP due to her being full. I offered pt to come in tomorrow @ 11:20am but she declined stating she needed to be seen today. I tried offering another appointment with a different provider today but she declined. Stated she would message PCP about the matter and call back.

## 2022-07-10 ENCOUNTER — Ambulatory Visit: Payer: Medicare Other | Admitting: Physician Assistant

## 2022-07-12 ENCOUNTER — Ambulatory Visit (INDEPENDENT_AMBULATORY_CARE_PROVIDER_SITE_OTHER): Payer: Medicare Other | Admitting: Physician Assistant

## 2022-07-12 ENCOUNTER — Encounter: Payer: Self-pay | Admitting: Physician Assistant

## 2022-07-12 ENCOUNTER — Ambulatory Visit (INDEPENDENT_AMBULATORY_CARE_PROVIDER_SITE_OTHER)
Admission: RE | Admit: 2022-07-12 | Discharge: 2022-07-12 | Disposition: A | Discharge status: Home / Self Care | Payer: Medicare Other | Source: Ambulatory Visit | Attending: Physician Assistant | Admitting: Physician Assistant

## 2022-07-12 VITALS — BP 120/76 | HR 59 | Temp 97.5°F | Ht 64.0 in | Wt 183.0 lb

## 2022-07-12 DIAGNOSIS — R5383 Other fatigue: Secondary | ICD-10-CM

## 2022-07-12 DIAGNOSIS — R051 Acute cough: Secondary | ICD-10-CM

## 2022-07-12 LAB — COMPREHENSIVE METABOLIC PANEL
ALT: 22 U/L (ref 0–35)
AST: 21 U/L (ref 0–37)
Albumin: 4.4 g/dL (ref 3.5–5.2)
Alkaline Phosphatase: 134 U/L — ABNORMAL HIGH (ref 39–117)
BUN: 32 mg/dL — ABNORMAL HIGH (ref 6–23)
CO2: 26 mEq/L (ref 19–32)
Calcium: 9.3 mg/dL (ref 8.4–10.5)
Chloride: 105 mEq/L (ref 96–112)
Creatinine, Ser: 2.27 mg/dL — ABNORMAL HIGH (ref 0.40–1.20)
GFR: 20.86 mL/min — ABNORMAL LOW (ref >60.00)
Glucose, Bld: 69 mg/dL — ABNORMAL LOW (ref 70–99)
Potassium: 4.3 mEq/L (ref 3.5–5.1)
Sodium: 141 mEq/L (ref 135–145)
Total Bilirubin: 0.7 mg/dL (ref 0.2–1.2)
Total Protein: 7.3 g/dL (ref 6.0–8.3)

## 2022-07-12 LAB — URINALYSIS, ROUTINE W REFLEX MICROSCOPIC
Bilirubin Urine: NEGATIVE
Hgb urine dipstick: NEGATIVE
Ketones, ur: NEGATIVE
Leukocytes,Ua: NEGATIVE
Nitrite: NEGATIVE
Specific Gravity, Urine: <=1.005 — AB (ref 1.000–1.030)
Total Protein, Urine: NEGATIVE
Urine Glucose: NEGATIVE
Urobilinogen, UA: 0.2 (ref 0.0–1.0)
pH: 6 (ref 5.0–8.0)

## 2022-07-12 LAB — CBC WITH DIFFERENTIAL/PLATELET
Basophils Absolute: 0.1 10*3/uL (ref 0.0–0.1)
Basophils Relative: 0.7 % (ref 0.0–3.0)
Eosinophils Absolute: 0.3 10*3/uL (ref 0.0–0.7)
Eosinophils Relative: 4.1 % (ref 0.0–5.0)
HCT: 38.6 % (ref 36.0–46.0)
Hemoglobin: 12.9 g/dL (ref 12.0–15.0)
Lymphocytes Relative: 43.3 % (ref 12.0–46.0)
Lymphs Abs: 3.4 10*3/uL (ref 0.7–4.0)
MCHC: 33.4 g/dL (ref 30.0–36.0)
MCV: 92.3 fl (ref 78.0–100.0)
Monocytes Absolute: 0.9 10*3/uL (ref 0.1–1.0)
Monocytes Relative: 11.6 % (ref 3.0–12.0)
Neutro Abs: 3.1 10*3/uL (ref 1.4–7.7)
Neutrophils Relative %: 40.3 % — ABNORMAL LOW (ref 43.0–77.0)
Platelets: 201 10*3/uL (ref 150.0–400.0)
RBC: 4.18 Mil/uL (ref 3.87–5.11)
RDW: 13.5 % (ref 11.5–15.5)
WBC: 7.7 10*3/uL (ref 4.0–10.5)

## 2022-07-12 MED ORDER — AMOXICILLIN-POT CLAVULANATE 500-125 MG PO TABS: 1.0000 | ORAL_TABLET | Freq: Two times a day (BID) | ORAL | 0 refills | Status: DC

## 2022-07-12 MED ORDER — ALBUTEROL SULFATE HFA 108 (90 BASE) MCG/ACT IN AERS: 2.0000 | INHALATION_SPRAY | Freq: Four times a day (QID) | RESPIRATORY_TRACT | 2 refills | Status: DC | PRN

## 2022-07-12 MED ORDER — BENZONATATE 100 MG PO CAPS: 100.0000 mg | ORAL_CAPSULE | Freq: Two times a day (BID) | ORAL | 0 refills | Status: DC | PRN

## 2022-07-12 NOTE — Patient Instructions (Signed)
It was great to see you!  Stop Amoxicillin and start Augmentin (amox-clav)  We will update blood work and urine test today to assess fatigue  An order for has been put in for you. To have this done, you can walk in at the [facility] location without a scheduled appointment.  The address is [address] It is across the street from [facility]. Lab and x-xray are located in the basement.   Hours of operation are M-F 8:30am to 5:00pm.  Please note that they are closed for lunch between 12:30 and 1:00pm.    Take care,  Jarold Motto PA-C

## 2022-07-12 NOTE — Progress Notes (Signed)
Tiffany [name] is a 72 y.o. female here for a new problem.  History of Present Illness:   Chief Complaint  Patient presents with   Cough    Pt c/o cough and expectorating greenish yellow. Denies fever or chills.   Fatigue    Pt c/o weakness and fatigue is worse past 2-3 days.    HPI  Cough & Fatigue Patient is complaining of a productive cough with green/yellow sputum. She is not managing sxs with any OTC products. She denies allergies, fever and chills.  Has burning sensation in airway.  Patient is complaining of fatigue and weakness that is getting worst over the past 2-3 days. She states that she becomes SOB without activity; this can occur lying in bed. Patient confirms chest tightness and effort with breathing. Patient she is drinking and eating well and has not had a fever since last office visit. She is on day 4 of 500 mg amoxicillin for a sore throat during an in office visit on 4/30. The last couple of nights she has not having good sleep. She expresses that she started 50 mg trazodone a week ago which helped the first 2 nights, but not so much now because her cough keeps her up. Patient does feel like she needs an inhaler. She denies sinus pressure, chest pain, and exposure to Covid.    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 1.4 cm  Grade I invasive lobular, ER and PR +/Her--, Ki 67 <5% Tried anastrozole for one month, but developed suicidal idea   Osteoarthritis, knee 09/24/2019   Xray 09/2019   Parkinsonism    Drug induced   Polyposis of colon    Psoriatic arthritis (HCC)    PTSD (post-traumatic stress disorder)     Secondary hyperparathyroidism (HCC)    Tardive dyskinesia    Thyroid disease      Social History   Tobacco Use   Smoking status: Never   Smokeless tobacco: Never  Vaping Use   Vaping Use: Never used  Substance Use Topics   Alcohol use: Never   Drug use: Never    Past Surgical History:  Procedure Laterality Date   ABDOMINAL HYSTERECTOMY  1987   CHOLECYSTECTOMY  1979   DILATION AND CURETTAGE OF UTERUS  1973   MASTECTOMY Bilateral 09/19/2014   TONSILLECTOMY  1970   URETERAL REIMPLANTION Bilateral 1974    Family History  Problem Relation Age of Onset   Lymphoma Mother 26   Lymphoma Sister 54   Breast cancer Sister 58   Colon polyps Sister    Lung cancer Sister        former smoker   Stroke Maternal Grandfather    Diabetes Paternal Grandfather    Colon cancer Neg Hx    Esophageal cancer Neg Hx    Stomach cancer Neg Hx    Rectal cancer Neg Hx     Allergies  Allergen Reactions   Aripiprazole Other (See Comments)    Parkinsonism      Lactose Intolerance (Gi) Diarrhea   Methotrexate Other (See Comments)    Hair loss,  severe stomatitis     Cefdinir Diarrhea    Other reaction(s): Diarrhea Yeast infection and fever; negative c diff   Etanercept Other (See Comments)    Headaches     Exemestane Other (See Comments)    Suicidal thoughts with medication     Fluoxetine Other (See Comments)    Parkinsonism   Methylprednisolone Sodium Succ Other (See Comments)    Agitated mania   Epinephrine Palpitations    tachycardia    Nitrofurantoin Other (See Comments)     GI upset from 2012    Current Medications:   Current Outpatient Medications:    Acetylcysteine (N-ACETYL CYSTEINE) 600 MG CAPS, 1 capsule Orally Twice a day, Disp: , Rfl:    amLODipine (NORVASC) 5 MG tablet, Take 1 tablet (5 mg total) by mouth daily., Disp: 90 tablet, Rfl: 1   amoxicillin (AMOXIL) 500 MG capsule, Take 1 capsule (500 mg total) by mouth 2 (two) times daily for 10 days., Disp: 20  capsule, Rfl: 0   atorvastatin (LIPITOR) 10 MG tablet, TAKE 1 TABLET(10 MG) BY MOUTH DAILY, Disp: 90 tablet, Rfl: 1   B Complex Vitamins (VITAMIN B COMPLEX) TABS, 1 tablet Orally Once daily, Disp: , Rfl:    betamethasone dipropionate 0.05 % cream, 1 application Externally Twice a day, Disp: , Rfl:    Cholecalciferol 50 MCG (2000 UT) TABS, 1 tablet Orally Once a day for 30 day(s), Disp: , Rfl:    fluconazole (DIFLUCAN) 150 MG tablet, Take 1 pill today and the 2nd pill in 3 days., Disp: 2 tablet, Rfl: 0   Golimumab (SIMPONI ARIA IV), Infusion every 8 weeks, Disp: , Rfl:    lamoTRIgine (LAMICTAL) 100 MG tablet, Take one tablet in the morning and take one tablet at lunch. (Patient taking differently: Take 50 mg by mouth daily.), Disp: 180 tablet, Rfl: 1   lamoTRIgine (LAMICTAL) 200 MG tablet, Take 1 tablet (200 mg total) by mouth at bedtime., Disp: 90 tablet, Rfl: 3   levothyroxine (SYNTHROID) 50 MCG tablet, Take 1 tablet (50 mcg total) by mouth daily., Disp: 30 tablet, Rfl: 6   LORazepam (ATIVAN) 1 MG tablet, TAKE 1 TABLET(1 MG) BY MOUTH TWICE DAILY AS NEEDED, Disp: 60 tablet, Rfl: 2   metroNIDAZOLE (METROGEL) 0.75 % gel, Apply to face 1-2 times daily, Disp: 45 g, Rfl: 1   Omega-3 Fatty Acids (FISH OIL) 1200 MG CAPS, Take 1 capsule by mouth in the morning and at bedtime., Disp: , Rfl:    Probiotic Product (ALIGN) 4 MG CAPS, Take 1 capsule by mouth daily in the afternoon., Disp: , Rfl:    sodium bicarbonate 650 MG tablet, Take 650 mg by mouth 2 (two) times daily., Disp: , Rfl:    traZODone (DESYREL) 50 MG tablet, Take 50 mg by mouth at bedtime as needed for sleep., Disp: , Rfl:    Review of Systems:   Review of Systems  Constitutional:  Positive for malaise/fatigue. Negative for chills and fever.  HENT:  Negative for sinus pain.   Respiratory:  Positive for cough, sputum production (green/yellow) and shortness of breath.   Cardiovascular:  Negative for chest pain.    Vitals:   Vitals:    07/12/22 1037  BP: 120/76  Pulse: (!) 59  Temp: (!) 97.5 F (36.4 C)  TempSrc: Temporal  SpO2: 100%  Weight: 183 lb (83 kg)  Height: 5\' 4"  (1.626 m)     Body mass index is 31.41 kg/m.  Physical Exam:   Physical Exam Constitutional:  General: She is not in acute distress.    Appearance: Normal appearance. She is not ill-appearing.  HENT:     Head: Normocephalic and atraumatic.     Right Ear: External ear normal.     Left Ear: External ear normal.  Eyes:     Extraocular Movements: Extraocular movements intact.     Pupils: Pupils are equal, round, and reactive to light.  Cardiovascular:     Rate and Rhythm: Normal rate and regular rhythm.     Heart sounds: Normal heart sounds. No murmur heard.    No gallop.  Pulmonary:     Effort: Pulmonary effort is normal. No respiratory distress.     Breath sounds: Normal breath sounds. No wheezing or rales.  Skin:    General: Skin is warm and dry.  Neurological:     Mental Status: She is alert and oriented to person, place, and time.  Psychiatric:        Judgment: Judgment normal.     Assessment and Plan:   Acute cough No red flags on exam.  Will escalate amoxicillin to Augmentin (renally dosed) per orders.  Trial tessalon Perles and as needed albuterol inhaler Will get CHEST XRAY out of abundance of caution Discussed taking medications as prescribed. Reviewed return precautions including worsening fever, SOB, worsening cough or other concerns. Push fluids and rest. I recommend that patient follow-up if symptoms worsen or persist despite treatment x 7-10 days, sooner if needed.  Other fatigue Suspect related to URI Update blood work per patient request Also update urinalysis Follow-up if new/worsening sx  I,Verona Buck,acting as a scribe for [employer], PA.,have documented all relevant documentation on the behalf of Jarold Motto, PA,as directed by  Jarold Motto, PA while in the presence of Jarold Motto,  [place].  I, Jarold Motto, [place], have reviewed all documentation for this visit. The documentation on 07/12/22 for the exam, diagnosis, procedures, and orders are all accurate and complete.   Jarold Motto, PA-C

## 2022-07-14 ENCOUNTER — Other Ambulatory Visit: Payer: Self-pay | Admitting: Physician Assistant

## 2022-07-14 DIAGNOSIS — R748 Abnormal levels of other serum enzymes: Secondary | ICD-10-CM

## 2022-07-14 DIAGNOSIS — E2839 Other primary ovarian failure: Secondary | ICD-10-CM

## 2022-07-15 ENCOUNTER — Encounter: Payer: Self-pay | Admitting: Family Medicine

## 2022-07-15 ENCOUNTER — Encounter: Payer: Self-pay | Admitting: Physician Assistant

## 2022-07-15 ENCOUNTER — Other Ambulatory Visit: Payer: Self-pay | Admitting: Physician Assistant

## 2022-07-15 ENCOUNTER — Ambulatory Visit (INDEPENDENT_AMBULATORY_CARE_PROVIDER_SITE_OTHER): Payer: Medicare Other | Admitting: Family Medicine

## 2022-07-15 ENCOUNTER — Encounter (HOSPITAL_COMMUNITY): Payer: Self-pay

## 2022-07-15 VITALS — BP 128/79 | HR 75 | Temp 97.3°F | Ht 64.0 in | Wt 183.2 lb

## 2022-07-15 DIAGNOSIS — R059 Cough, unspecified: Secondary | ICD-10-CM | POA: Diagnosis not present

## 2022-07-15 DIAGNOSIS — R748 Abnormal levels of other serum enzymes: Secondary | ICD-10-CM

## 2022-07-15 DIAGNOSIS — N184 Chronic kidney disease, stage 4 (severe): Secondary | ICD-10-CM | POA: Diagnosis not present

## 2022-07-15 DIAGNOSIS — F3160 Bipolar disorder, current episode mixed, unspecified: Secondary | ICD-10-CM

## 2022-07-15 LAB — POC COVID19 BINAXNOW: SARS Coronavirus 2 Ag: NEGATIVE

## 2022-07-15 MED ORDER — PREDNISONE 20 MG PO TABS: 40.0000 mg | ORAL_TABLET | Freq: Every day | ORAL | 0 refills | Status: DC

## 2022-07-15 MED ORDER — AZELASTINE HCL 0.1 % NA SOLN: 2.0000 | Freq: Two times a day (BID) | NASAL | 12 refills | Status: DC

## 2022-07-15 MED ORDER — FLUCONAZOLE 150 MG PO TABS: ORAL_TABLET | ORAL | 0 refills | Status: DC

## 2022-07-15 NOTE — Progress Notes (Signed)
Tiffany [name] is a 72 y.o. female who presents today for an office visit.  Assessment/Plan:  New/Acute Problems: Sinusitis / Cough Had lengthy discussion with patient and her husband today regarding her recent symptoms.  Labs from last week were all stable and reassuring.  COVID test today is negative.  Will check for mono per patient request.  She did have an x-ray last week which has not been read by radiology however.  Clear based on my read.  Has a reassuring lung exam today as well.  Symptoms are most likely mostly coming from sinus infection.  She is already on Augmentin.  She will complete her course of antibiotics.  We did discuss additional treatment options.  Will add on Astelin nasal spray.  We will also start prednisone 40 mg daily.  She does have a known history of bipolar 1 disorder but has tolerated prednisone well in the past without any precipitation of manic episodes.  She is aware of potential side effects and reasons to discontinue the prednisone.  She will continue other medications as prescribed by her PCP.  She will let us know if not improving in the next week or so.  Will consider trial of antibiotic from a different class of medication such as a macrolide or fluoroquinolone if not improving with above.  If still has ongoing issues with her chronic sinusitis or strep pharyngitis will need to be referred to ENT.  We discussed reasons to return to care.  Chronic Problems Addressed Today: Bipolar 1 Managed by psychiatry.  No recent major manic episodes.  As above discussed prednisone may exacerbate however she has done well with prednisone in the past previously.  Do not anticipate this to cause any issues.  CKD Stable on most recent labs  Elevated alk phos PCP is ordering additional labs.  Answered patient's and her husband's questions today regarding this.    Subjective:  HPI:  See A/P for status of chronic conditions.  She is here with sore throat and congestion.  She  was seen here last week by her PCP.  Strep was positive and she was started on amoxicillin.  She came back 4 days later with worsening fatigue and weakness.  Her amoxicillin was switched to Augmentin.  Also started on Tessalon and albuterol inhaler.  Symptoms have been still persistent.  Still has a lot of fatigue.  She would like to be checked for COVID and mono today.  Cough is improved just a little bit.  No fevers or chills.  No chest pain or shortness of breath.  She has developed some sinus congestion over the last few days.  She has been dealing with intermittent URIs for the past several months.  She has been diagnosed with strep pharyngitis 3 times over the last few months and had a couple episodes of sinusitis.       Objective:  Physical Exam: BP 128/79   Pulse 75   Temp (!) 97.3 F (36.3 C) (Temporal)   Ht 5\' 4"  (1.626 m)   Wt 183 lb 3.2 oz (83.1 kg)   SpO2 95%   BMI 31.45 kg/m   Gen: No acute distress, resting comfortably HEENT: TMs clear.  OP erythematous.  Nose mucosa erythematous and boggy bilaterally.  Maxillary sinuses with decreased transillumination bilaterally. CV: Regular rate and rhythm with no murmurs appreciated Pulm: Normal work of breathing, clear to auscultation bilaterally with no crackles, wheezes, or rhonchi Neuro: Grossly normal, moves all extremities Psych: Normal affect and  thought content      Cam Dauphin M. Jimmey Ralph, MD 07/15/2022 3:17 PM

## 2022-07-15 NOTE — Patient Instructions (Addendum)
It was very nice to see you today!  I think you have a sinus infection.  Please finish the Augmentin.  Start the prednisone.  Start Astelin.  Let us know if not proving in a week or so and we can try different type of antibiotic.  Return if symptoms worsen or fail to improve.   Take care, Dr Jimmey Ralph  PLEASE NOTE:  If you had any lab tests, please let us know if you have not heard back within a few days. You may see your results on mychart before we have a chance to review them but we will give you a call once they are reviewed by [place].   If we ordered any referrals today, please let us know if you have not heard from their office within the next week.   If you had any urgent prescriptions sent in today, please check with the pharmacy within an hour of our visit to make sure the prescription was transmitted appropriately.   Please try these tips to maintain a healthy lifestyle:  Eat at least 3 REAL meals and 1-2 snacks per day.  Aim for no more than 5 hours between eating.  If you eat breakfast, please do so within one hour of getting up.   Each meal should contain half fruits/vegetables, one quarter protein, and one quarter carbs (no bigger than a computer mouse)  Cut down on sweet beverages. This includes juice, soda, and sweet tea.   Drink at least 1 glass of water with each meal and aim for at least 8 glasses per day  Exercise at least 150 minutes every week.

## 2022-07-16 ENCOUNTER — Ambulatory Visit: Payer: Medicare Other | Admitting: Adult Health

## 2022-07-16 LAB — HEPATIC FUNCTION PANEL
ALT: 29 U/L (ref 0–35)
AST: 25 U/L (ref 0–37)
Albumin: 4.4 g/dL (ref 3.5–5.2)
Alkaline Phosphatase: 138 U/L — ABNORMAL HIGH (ref 39–117)
Bilirubin, Direct: 0.2 mg/dL (ref 0.0–0.3)
Total Bilirubin: 0.9 mg/dL (ref 0.2–1.2)
Total Protein: 7.1 g/dL (ref 6.0–8.3)

## 2022-07-16 LAB — GAMMA GT: GGT: 10 U/L (ref 7–51)

## 2022-07-16 LAB — MONONUCLEOSIS SCREEN: Heterophile, Mono Screen: NEGATIVE

## 2022-07-17 ENCOUNTER — Other Ambulatory Visit: Payer: Self-pay | Admitting: Physician Assistant

## 2022-07-17 DIAGNOSIS — E559 Vitamin D deficiency, unspecified: Secondary | ICD-10-CM

## 2022-07-17 DIAGNOSIS — R748 Abnormal levels of other serum enzymes: Secondary | ICD-10-CM

## 2022-07-17 NOTE — Progress Notes (Signed)
Test for mono is negative.  Tiffany [name]. Jimmey Ralph, MD 07/17/2022 7:33 AM

## 2022-07-19 ENCOUNTER — Other Ambulatory Visit (INDEPENDENT_AMBULATORY_CARE_PROVIDER_SITE_OTHER): Payer: Medicare Other

## 2022-07-19 DIAGNOSIS — E559 Vitamin D deficiency, unspecified: Secondary | ICD-10-CM | POA: Diagnosis not present

## 2022-07-19 DIAGNOSIS — R748 Abnormal levels of other serum enzymes: Secondary | ICD-10-CM | POA: Diagnosis not present

## 2022-07-19 LAB — VITAMIN D 25 HYDROXY (VIT D DEFICIENCY, FRACTURES): VITD: 43.09 ng/mL (ref 30.00–100.00)

## 2022-07-20 LAB — PTH, INTACT AND CALCIUM
Calcium: 8.9 mg/dL (ref 8.6–10.4)
PTH: 222 pg/mL — ABNORMAL HIGH (ref 16–77)

## 2022-07-22 ENCOUNTER — Encounter: Payer: Self-pay | Admitting: Physician Assistant

## 2022-07-22 ENCOUNTER — Encounter: Payer: Self-pay | Admitting: Internal Medicine

## 2022-07-23 ENCOUNTER — Ambulatory Visit (INDEPENDENT_AMBULATORY_CARE_PROVIDER_SITE_OTHER): Payer: Medicare Other | Admitting: Adult Health

## 2022-07-23 ENCOUNTER — Encounter: Payer: Self-pay | Admitting: Adult Health

## 2022-07-23 DIAGNOSIS — F411 Generalized anxiety disorder: Secondary | ICD-10-CM

## 2022-07-23 DIAGNOSIS — F99 Mental disorder, not otherwise specified: Secondary | ICD-10-CM

## 2022-07-23 DIAGNOSIS — F5105 Insomnia due to other mental disorder: Secondary | ICD-10-CM | POA: Diagnosis not present

## 2022-07-23 DIAGNOSIS — F4310 Post-traumatic stress disorder, unspecified: Secondary | ICD-10-CM

## 2022-07-23 DIAGNOSIS — F319 Bipolar disorder, unspecified: Secondary | ICD-10-CM | POA: Diagnosis not present

## 2022-07-23 NOTE — Progress Notes (Signed)
Tiffany [name] [id number] 1950-11-07 72 y.o.  Subjective:   Patient ID:  Tiffany [name] is a 72 y.o. (DOB 09/12/1950) female.  Chief Complaint: No chief complaint on file.   HPI Tiffany [name] presents to the office today for follow-up of PTSD, insomnia, GAD and  BPD 1.  Accompanied by husband.  Describes mood today as "about the same". Pleasant. Reports tearfulness. Mood symptoms - reports depression anxiety and irritability. Reports worry, rumination, and over thinking. Reports feeling   overwhelmed at times. Reports racing thoughts. Mood is variable - "cycing". Stating "I'm haven't been feeling well". Currently recovering from illness - taking antibiotics. Reports concerns about recent laboratory findings related to PTH - has reached out to endocrinologist and nephrologist to discuss. Reports having a good mother's day. Feels like medications are helpful. Family supportive. Varying interest and motivation. Taking medications as prescribed. Working with therapist - [name].  Energy levels lower. Active, working towards a regular exercise routine. Enjoys some usual interests and activities. Married. Lives with husband their daughter. Talking to family and friends.  Appetite decreased. Weight fluctuates. Sleep has improved. Averages 8 to 9 hours at night and napping during the day. Focus and concentration varies. Completing tasks. Managing some aspects of household. Retired.  Denies SI or HI.  Denies AH or VH  Denies self harm. Denies substance use.  Previous medications: Celexa, Zyprexa, Tegretol, Depakote, Serzone, Topamax, Seroquel, Effexor, Lexapro, Desipramine, Neurontin, Abilify, Geodon, Propanolol, Cymbalta, Cogentin, Trihexyphenadyl, Sinmmet, Provigil, Selegiline, Requip, Amantadine, Prozac, Mirapex, Azilect, Metoclopramide, Baclofen, Artane, Namenda, Latuda, Lithium,     GAD-7    Flowsheet Row Office Visit from 05/06/2022 in [facility]  from 04/25/2022 in [facility] Visit from 04/10/2022 in [facility] Visit from 02/16/2021 in [facility] from 05/14/2019 in [facility]  Total GAD-7 Score 4 9 17 13 21       Mini-Mental    Flowsheet Row Office Visit from 06/07/2019 in [facility]  Total Score (max 30 points ) 29      PHQ2-9    Flowsheet Row Office Visit from 07/15/2022 in [facility] from 05/06/2022 in [facility] from 04/25/2022 in [facility] Clinical Support from 04/15/2022 in [facility] from 04/10/2022 in [facility]  PHQ-2 Total Score 3 0 0 0 1  PHQ-9 Total Score 15 0 6 0 3      Flowsheet Row Clinical Support from 04/15/2022 in [facility] ED from 04/03/2022 in [facility] Emergency Department at [facility] ED from 11/26/2021 in [facility] Emergency Department at [facility]  C-SSRS RISK CATEGORY No Risk No Risk No Risk        Review of Systems:  Review of Systems  Musculoskeletal:  Negative for gait problem.  Neurological:  Negative for tremors.  Psychiatric/Behavioral:         Please refer to HPI    Medications: I have reviewed the patient's current medications.  Current Outpatient Medications  Medication Sig Dispense Refill   Acetylcysteine (N-ACETYL CYSTEINE) 600 MG CAPS 1 capsule Orally Twice a day     albuterol (VENTOLIN HFA) 108 (90 Base) MCG/ACT inhaler Inhale 2 puffs into the lungs every 6 (six) hours as needed for wheezing or shortness of breath. 8 g 2   amLODipine (NORVASC) 5 MG tablet Take 1 tablet (  5 mg total) by mouth daily. 90 tablet 1   amoxicillin-clavulanate (AUGMENTIN) 500-125 MG tablet Take 1 tablet by mouth 2 (two) times daily. 14 tablet 0   atorvastatin (LIPITOR)  10 MG tablet TAKE 1 TABLET(10 MG) BY MOUTH DAILY 90 tablet 1   azelastine (ASTELIN) 0.1 % nasal spray Place 2 sprays into both nostrils 2 (two) times daily. 30 mL 12   B Complex Vitamins (VITAMIN B COMPLEX) TABS 1 tablet Orally Once daily     benzonatate (TESSALON) 100 MG capsule Take 1 capsule (100 mg total) by mouth 2 (two) times daily as needed for cough. 20 capsule 0   betamethasone dipropionate 0.05 % cream 1 application Externally Twice a day     Cholecalciferol 50 MCG (2000 UT) TABS 1 tablet Orally Once a day for 30 day(s)     fluconazole (DIFLUCAN) 150 MG tablet Take 1 tablet by mouth. If symptoms persist, may take an additional tablet in 3-5 days. May repeat for a total of 3 tablets. 3 tablet 0   Golimumab (SIMPONI ARIA IV) Infusion every 8 weeks     lamoTRIgine (LAMICTAL) 200 MG tablet Take 1 tablet (200 mg total) by mouth at bedtime. 90 tablet 3   levothyroxine (SYNTHROID) 50 MCG tablet Take 1 tablet (50 mcg total) by mouth daily. 30 tablet 6   LORazepam (ATIVAN) 1 MG tablet TAKE 1 TABLET(1 MG) BY MOUTH TWICE DAILY AS NEEDED 60 tablet 2   metroNIDAZOLE (METROGEL) 0.75 % gel Apply to face 1-2 times daily 45 g 1   Omega-3 Fatty Acids (FISH OIL) 1200 MG CAPS Take 1 capsule by mouth in the morning and at bedtime.     predniSONE (DELTASONE) 20 MG tablet Take 2 tablets (40 mg total) by mouth daily with breakfast. 10 tablet 0   Probiotic Product (ALIGN) 4 MG CAPS Take 1 capsule by mouth daily in the afternoon.     sodium bicarbonate 650 MG tablet Take 650 mg by mouth 2 (two) times daily.     traZODone (DESYREL) 50 MG tablet Take 50 mg by mouth at bedtime as needed for sleep.     No current facility-administered medications for this visit.    Medication Side Effects: None  Allergies:  Allergies  Allergen Reactions   Aripiprazole Other (See Comments)    Parkinsonism      Lactose Intolerance (Gi) Diarrhea   Methotrexate Other (See Comments)    Hair loss, severe stomatitis      Cefdinir Diarrhea    Other reaction(s): Diarrhea Yeast infection and fever; negative c diff   Etanercept Other (See Comments)    Headaches     Exemestane Other (See Comments)    Suicidal thoughts with medication     Fluoxetine Other (See Comments)    Parkinsonism   Methylprednisolone Sodium Succ Other (See Comments)    Agitated mania   Epinephrine Palpitations    tachycardia    Nitrofurantoin Other (See Comments)     GI upset from 2012    Past Medical History:  Diagnosis Date   Bipolar 1 disorder (HCC)    Chronic diarrhea    loose stools twice a day on average for years   CKD stage G4/A1, 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 1.4 cm  Grade I invasive lobular,  ER and PR +/Her--, Ki 67 <5% Tried anastrozole for one month, but developed suicidal idea   Osteoarthritis, knee 09/24/2019   Xray 09/2019   Parkinsonism    Drug induced   Polyposis of colon    Psoriatic arthritis (HCC)    PTSD (post-traumatic stress disorder)    Secondary hyperparathyroidism (HCC)    Tardive dyskinesia    Thyroid disease     Past Medical History, Surgical history, Social history, and Family history were reviewed and updated as appropriate.   Please see review of systems for further details on the patient's review from today.   Objective:   Physical Exam:  There were no vitals taken for this visit.  Physical Exam Constitutional:      General: She is not in acute distress. Musculoskeletal:        General: No deformity.  Neurological:     Mental Status: She is alert and oriented to person, place, and time.     Coordination: Coordination normal.  Psychiatric:        Attention and Perception: Attention and perception normal. She does not perceive auditory or visual hallucinations.         Mood and Affect: Mood normal. Mood is not anxious or depressed. Affect is not labile, blunt, angry or inappropriate.        Speech: Speech normal.        Behavior: Behavior normal.        Thought Content: Thought content normal. Thought content is not paranoid or delusional. Thought content does not include homicidal or suicidal ideation. Thought content does not include homicidal or suicidal plan.        Cognition and Memory: Cognition and memory normal.        Judgment: Judgment normal.     Comments: Insight intact     Lab Review:     Component Value Date/Time   NA 141 07/12/2022 1108   NA 142 10/17/2021 0000   K 4.3 07/12/2022 1108   CL 105 07/12/2022 1108   CO2 26 07/12/2022 1108   GLUCOSE 69 (L) 07/12/2022 1108   BUN 32 (H) 07/12/2022 1108   BUN 30 (A) 10/17/2021 0000   CREATININE 2.27 (H) 07/12/2022 1108   CREATININE 2.29 (H) 05/22/2021 1044   CALCIUM 8.9 07/19/2022 1100   PROT 7.1 07/15/2022 1515   ALBUMIN 4.4 07/15/2022 1515   AST 25 07/15/2022 1515   ALT 29 07/15/2022 1515   ALKPHOS 138 (H) 07/15/2022 1515   BILITOT 0.9 07/15/2022 1515   GFRNONAA 22 (L) 04/03/2022 1425   GFRAA 25 (L) 09/13/2019 1243       Component Value Date/Time   WBC 7.7 07/12/2022 1108   RBC 4.18 07/12/2022 1108   HGB 12.9 07/12/2022 1108   HCT 38.6 07/12/2022 1108   PLT 201.0 07/12/2022 1108   MCV 92.3 07/12/2022 1108   MCH 30.5 04/03/2022 1425   MCHC 33.4 07/12/2022 1108   RDW 13.5 07/12/2022 1108   LYMPHSABS 3.4 07/12/2022 1108   MONOABS 0.9 07/12/2022 1108   EOSABS 0.3 07/12/2022 1108   BASOSABS 0.1 07/12/2022 1108    Lithium Lvl  Date Value Ref Range Status  05/22/2021 0.7 0.6 - 1.2 mmol/L Final     No results found for: "PHENYTOIN", "PHENOBARB", "VALPROATE", "CBMZ"   .res Assessment: Plan:    Plan:  Lorazepam 1mg  BID for anxiety - may take one tablet extra for severe anxiety symptoms. Taking one at night routinely.  Lamictal 200mg  hs.  Using Trazadone  as  needed  NAC tablets BID - for obsessive thoughts, worry, and rumination - unable to take SSRI's  RTC weekly  Seeing Endocrinologist for thyroid  Counseled patient regarding potential benefits, risks, and side effects of Lamictal to include potential risk of Stevens-Johnson syndrome. Advised patient to stop taking Lamictal and contact office immediately if rash develops and to seek urgent medical attention if rash is severe and/or spreading quickly.  There are no diagnoses linked to this encounter.   Please see After Visit Summary for patient specific instructions.  Future Appointments  Date Time Provider Department Center  07/23/2022 11:40 AM Siarah Deleo, Thereasa Solo, NP [facility] None  07/31/2022 12:00 PM Merdith Adan, Thereasa Solo, NP [facility] None  08/07/2022  1:40 PM Ryonna Cimini, Thereasa Solo, NP [facility] None  11/12/2022 11:50 AM Shamleffer, Konrad Dolores, MD [facility] None  11/14/2022 11:30 AM Van Clines, MD [facility] None  04/24/2023  2:00 PM [facility] ANNUAL WELLNESS VISIT 1 [facility]    No orders of the defined types were placed in this encounter.   -------------------------------

## 2022-07-24 NOTE — Telephone Encounter (Signed)
Referral faxed to [phone]

## 2022-07-26 LAB — HM DEXA SCAN: HM Dexa Scan: NORMAL

## 2022-07-29 ENCOUNTER — Other Ambulatory Visit: Payer: Self-pay | Admitting: Physician Assistant

## 2022-07-29 NOTE — Progress Notes (Unsigned)
Name: Tiffany [name]  MRN/ DOB: [id number], 1951-01-02    Age/ Sex: 72 y.o., female    PCP: Jarold Motto, PA   Reason for Endocrinology Evaluation: Hypothyroidism     Date of Initial Endocrinology Evaluation: 12/31/2021    HPI: Tiffany [name] is a 72 y.o. female with a past medical history of Hx of Breast Ca (no XRT no chemo), bipolar disorder, CKD, TD and parkinson's disease. The patient presented for initial endocrinology clinic visit on 12/31/2021 for consultative assistance with her Hypothyroidism.     Most of the history has been obtained from her spouse Tiffany [name] has been diagnosed with hypothyroidism ~ 30 yrs ago while on Lithium  requiring LT-4 replacement.  She was on lithium until ~ 11/2021 when it was stopped  due to CKD which resulted in lower TSH and fluctuating TFT's    Saw Dr. Talmage Nap which has reduced levothyroxine to  below dose   Sister with thyroid disease  SUBJECTIVE:    Today (07/30/22):  Tiffany [name] is here for follow-up on hypothyroidism and elevated PTH.   Patient is here to discuss a new finding of elevated PTH She is [facility] 07[facility], I have reviewed her labs from 04/2022 through [facility]   [id number] over the past 10 yrs   She was on Calcitriol through nephrology  She is not on Calcium  Vitamin D 2000 iu   She has nausea  Has chronic constipation on Miralax  Has noted  palpitations and SOB  which is improving  Denies local neck swelling     Levothyroxine 50 mcg  HISTORY:  Past Medical History:  Past Medical History:  Diagnosis Date   Bipolar 1 disorder (HCC)    Chronic diarrhea    loose stools twice a day on average for years   CKD stage G4/A1, 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 1.4 cm  Grade I  invasive lobular, ER and PR +/Her--, Ki 67 <5% Tried anastrozole for one month, but developed suicidal idea   Osteoarthritis, knee 09/24/2019   Xray 09/2019   Parkinsonism    Drug induced   Polyposis of colon    Psoriatic arthritis (HCC)    PTSD (post-traumatic stress disorder)    Secondary hyperparathyroidism (HCC)    Tardive dyskinesia    Thyroid disease    Past Surgical History:  Past Surgical History:  Procedure Laterality Date   ABDOMINAL HYSTERECTOMY  1987   CHOLECYSTECTOMY  1979   DILATION AND CURETTAGE OF UTERUS  1973   MASTECTOMY Bilateral 09/19/2014   TONSILLECTOMY  1970   URETERAL REIMPLANTION Bilateral 1974    Social History:  reports that she has never smoked. She has never used smokeless tobacco. She reports that she does not drink alcohol and does not use drugs. Family History: family history includes Breast cancer (age of onset: 4) in her sister; Colon polyps in her sister; Diabetes in her paternal grandfather; Lung cancer in her sister; Lymphoma (age of onset: 22) in her sister; Lymphoma (age of onset: 67) in her mother; Stroke in her maternal grandfather.   HOME MEDICATIONS: Allergies as of 07/30/2022       Reactions   Aripiprazole Other (See Comments)   Parkinsonism     Lactose Intolerance (gi)  Diarrhea   Methotrexate Other (See Comments)   Hair loss, severe stomatitis   Cefdinir Diarrhea   Other reaction(s): Diarrhea Yeast infection and fever; negative c diff   Etanercept Other (See Comments)   Headaches   Exemestane Other (See Comments)   Suicidal thoughts with medication   Fluoxetine Other (See Comments)   Parkinsonism   Methylprednisolone Sodium Succ Other (See Comments)   Agitated mania   Epinephrine Palpitations   tachycardia   Nitrofurantoin Other (See Comments)    GI upset from 2012        Medication List        Accurate as of Jul 30, 2022  3:48 PM. If you have any questions, ask your nurse or doctor.          STOP taking  these medications    albuterol 108 (90 Base) MCG/ACT inhaler Commonly known as: VENTOLIN HFA Stopped by: Scarlette Shorts, MD   amoxicillin-clavulanate 500-125 MG tablet Commonly known as: Augmentin Stopped by: Scarlette Shorts, MD   azelastine 0.1 % nasal spray Commonly known as: ASTELIN Stopped by: Scarlette Shorts, MD   benzonatate 100 MG capsule Commonly known as: TESSALON Stopped by: Scarlette Shorts, MD   fluconazole 150 MG tablet Commonly known as: DIFLUCAN Stopped by: Scarlette Shorts, MD   predniSONE 20 MG tablet Commonly known as: DELTASONE Stopped by: Scarlette Shorts, MD       TAKE these medications    Align 4 MG Caps Take 1 capsule by mouth daily in the afternoon.   amLODipine 5 MG tablet Commonly known as: NORVASC Take 1 tablet (5 mg total) by mouth daily.   atorvastatin 10 MG tablet Commonly known as: LIPITOR TAKE 1 TABLET(10 MG) BY MOUTH DAILY   augmented betamethasone dipropionate 0.05 % cream Commonly known as: DIPROLENE-AF APPLY TO THE AFFECTED AREAS OF LEG TWICE DAILY AS NEEDED FOR RASH. NOT TO FACE, GROIN, UNDERARMS   betamethasone dipropionate 0.05 % cream 1 application Externally Twice a day   Cholecalciferol 50 MCG (2000 UT) Tabs 1 tablet Orally Once a day for 30 day(s)   Fish Oil 1200 MG Caps Take 1 capsule by mouth in the morning and at bedtime.   lamoTRIgine 200 MG tablet Commonly known as: LAMICTAL Take 1 tablet (200 mg total) by mouth at bedtime.   levothyroxine 50 MCG tablet Commonly known as: SYNTHROID Take 1 tablet (50 mcg total) by mouth daily.   LORazepam 1 MG tablet Commonly known as: ATIVAN TAKE 1 TABLET(1 MG) BY MOUTH TWICE DAILY AS NEEDED   metroNIDAZOLE 0.75 % gel Commonly known as: METROGEL Apply to face 1-2 times daily   N-Acetyl Cysteine 600 MG Caps Generic drug: Acetylcysteine 1 capsule Orally Twice a day   SIMPONI ARIA IV Infusion every 8 weeks   sodium bicarbonate 650 MG  tablet Take 650 mg by mouth 2 (two) times daily.   traZODone 50 MG tablet Commonly known as: DESYREL Take 50 mg by mouth at bedtime as needed for sleep.   Vitamin B Complex Tabs 1 tablet Orally Once daily          REVIEW OF SYSTEMS: A comprehensive ROS was conducted with the patient and is negative except as per HPI   OBJECTIVE:  VS: BP 110/68 (BP Location: Left Arm, Patient Position: Sitting, Cuff Size: Large)   Pulse 69   Ht 5\' 4"  (1.626 m)   Wt 183 lb (83 kg)   SpO2 97%   BMI 31.41 kg/m  Wt Readings from Last 3 Encounters:  07/30/22 183 lb (83 kg)  07/15/22 183 lb 3.2 oz (83.1 kg)  07/12/22 183 lb (83 kg)     EXAM: General: Tiffany [name] appears well and is in NAD  Neck: General: Supple without adenopathy. Thyroid: Thyroid size normal.  No goiter or nodules appreciated.   Lungs: Clear with good BS bilat   Heart: Auscultation: RRR.  Extremities:  BL LE: No pretibial edema   Mental Status: Judgment, insight: Intact Orientation: Oriented to time, place, and person Mood and affect: No depression, anxiety, or agitation     DATA REVIEWED:    Latest Reference Range & Units 07/19/22 11:00  Calcium 8.6 - 10.4 mg/dL 8.9    Latest Reference Range & Units 07/15/22 15:15  Alkaline Phosphatase 39 - 117 U/L 138 (H)  Albumin 3.5 - 5.2 g/dL 4.4  AST 0 - 37 U/L 25  ALT 0 - 35 U/L 29  Total Protein 6.0 - 8.3 g/dL 7.1  Bilirubin, Direct 0.0 - 0.3 mg/dL 0.2  Total Bilirubin 0.2 - 1.2 mg/dL 0.9  GGT 7 - 51 U/L 10    Latest Reference Range & Units 07/19/22 11:00  VITD 30.00 - 100.00 ng/mL 43.09    Latest Reference Range & Units 07/19/22 11:00  PTH, Intact 16 - 77 pg/mL 222 (H)  (H): Data is abnormally high   05/02/2022 BUN 29 CR 2.25 Ca 9.2 Phosphorus 3.5 Na 142 K 4.1 GFR 23   ASSESSMENT/PLAN/RECOMMENDATIONS:   Hypothyroidism:   -This has been attributed to chronic lithium use.  She has been off lithium since  ~ 11/2021  -Patient is clinically  euthyroid -TSH has been fluctuating, it is too soon to check her TSH today, patient will have that rechecked in 4 weeks   Medications : Continue levothyroxine 50 mcg daily    2. Secondary Hyperparathyroidism:   -I have reviewed her labs done through PCP office which showed an elevated PTH of 222 PG/mL, normal calcium and vitamin D - Spouse had PTH levels over the past 10 years, it appears that PTH increased while off calcitriol , of note, the patient is under nephrology care for CKD -Today we spent time discussing the difference between primary versus secondary versus tertiary hyperparathyroidism. -The patient clearly does have secondary hyperparathyroidism due to CKD, and this is managed by nephrology -Emphasized importance of continuing vitamin D intake and consuming 2-3 servings of calcium through her diet -Most recent DXA scan was normal   I spent 27 minutes preparing to see the patient by review of recent labs, imaging and procedures, obtaining and reviewing separately obtained history, communicating with the patient/family or caregiver, ordering medications, tests or procedures, and documenting clinical information in the EHR including the differential Dx, treatment, and any further evaluation and other management    Signed electronically by: Lyndle Herrlich, MD  [facility] Endocrinology  [facility] [address] [place] [place], [place] [address] Phone: [phone] FAX: [phone]   CC: Jarold Motto, [place] [address] [place] [address] Phone: [phone] Fax: [phone]   Return to Endocrinology clinic as below: Future Appointments  Date Time Provider Department Center  07/31/2022 12:00 PM Mozingo, Thereasa Solo, NP [facility] None  08/07/2022  1:40 PM Mozingo, Thereasa Solo, NP [facility] None  08/14/2022 11:40 AM Mozingo, Thereasa Solo, NP [facility] None  08/21/2022 11:40 AM Mozingo, Thereasa Solo, NP [facility] None  08/28/2022 11:40 AM  Mozingo, Thereasa Solo, NP [facility] None  08/30/2022 11:30 AM LB  ENDO/NEURO LAB [facility] None  09/04/2022 11:40 AM Mozingo, Thereasa Solo, NP [facility] None  11/12/2022 11:50 AM Khyler Urda, Konrad Dolores, MD [facility] None  11/14/2022 11:30 AM Van Clines, MD [facility] None  04/24/2023  2:00 PM [facility] ANNUAL WELLNESS VISIT 1 [facility]

## 2022-07-29 NOTE — Telephone Encounter (Signed)
Okay to refill Betamethasone dipropionate 0.05% cream? Medication was not prescribed by you.

## 2022-07-30 ENCOUNTER — Encounter: Payer: Self-pay | Admitting: Internal Medicine

## 2022-07-30 ENCOUNTER — Ambulatory Visit (INDEPENDENT_AMBULATORY_CARE_PROVIDER_SITE_OTHER): Payer: Medicare Other | Admitting: Internal Medicine

## 2022-07-30 VITALS — BP 110/68 | HR 69 | Ht 64.0 in | Wt 183.0 lb

## 2022-07-30 DIAGNOSIS — E039 Hypothyroidism, unspecified: Secondary | ICD-10-CM

## 2022-07-30 DIAGNOSIS — N2581 Secondary hyperparathyroidism of renal origin: Secondary | ICD-10-CM

## 2022-07-31 ENCOUNTER — Encounter: Payer: Self-pay | Admitting: Adult Health

## 2022-07-31 ENCOUNTER — Ambulatory Visit (INDEPENDENT_AMBULATORY_CARE_PROVIDER_SITE_OTHER): Payer: Medicare Other | Admitting: Adult Health

## 2022-07-31 DIAGNOSIS — F4310 Post-traumatic stress disorder, unspecified: Secondary | ICD-10-CM

## 2022-07-31 DIAGNOSIS — F411 Generalized anxiety disorder: Secondary | ICD-10-CM | POA: Diagnosis not present

## 2022-07-31 DIAGNOSIS — F5105 Insomnia due to other mental disorder: Secondary | ICD-10-CM | POA: Diagnosis not present

## 2022-07-31 DIAGNOSIS — F319 Bipolar disorder, unspecified: Secondary | ICD-10-CM

## 2022-07-31 DIAGNOSIS — F99 Mental disorder, not otherwise specified: Secondary | ICD-10-CM

## 2022-07-31 NOTE — Progress Notes (Signed)
Creda Tondreau Simm [id number] [birth year] 72 y.o.  Subjective:   Patient ID:  Tiffany [name] is a 72 y.o. (DOB 12-27-1950) female.  Chief Complaint: No chief complaint on file.   HPI Tiffany [name] presents to the office today for follow-up of PTSD, insomnia, GAD and  BPD 1.  Describes mood today as "ok". Pleasant. Reports tearfulness - "sensitive". Mood symptoms - reports depression anxiety and irritability. Reports worry, rumination, and over thinking. Reports feeling   overwhelmed at times. Reports racing thoughts. Mood is variable - periods of "hypomania". Stating "I have some high points and enjoy things". Feels like medications are helpful. Continues to recover from illness - finished second round of antibiotics. Recent appointment with endocrinologist. Reports seeing nephrologist next week. Family supportive. Varying interest and motivation. Taking medications as prescribed. Working with therapist - [name].  Energy levels lower. Active, working towards a regular exercise routine. Enjoys some usual interests and activities. Married. Lives with husband their daughter. Talking to family and friends.  Appetite decreased. Weight fluctuates. Sleep has improved. Averages 8 to 9 hours at night and napping during the day. Focus and concentration varies. Completing tasks. Managing some aspects of household. Retired.  Denies SI or HI.  Denies AH or VH. Denies self harm. Denies substance use.  Previous medications: Celexa, Zyprexa, Tegretol, Depakote, Serzone, Topamax, Seroquel, Effexor, Lexapro, Desipramine, Neurontin, Abilify, Geodon, Propanolol, Cymbalta, Cogentin, Trihexyphenadyl, Sinmmet, Provigil, Selegiline, Requip, Amantadine, Prozac, Mirapex, Azilect, Metoclopramide, Baclofen, Artane, Namenda, Latuda, Lithium,      GAD-7    Flowsheet Row Office Visit from 05/06/2022 in [facility] from 04/25/2022 in [facility] Visit  from 04/10/2022 in [facility] Visit from 02/16/2021 in [facility] from 05/14/2019 in [facility]  Total GAD-7 Score 4 9 17 13 21       Mini-Mental    Flowsheet Row Office Visit from 06/07/2019 in [facility] Neurology  Total Score (max 30 points ) 29      PHQ2-9    Flowsheet Row Office Visit from 07/15/2022 in [facility] from 05/06/2022 in [facility] from 04/25/2022 in [facility] Clinical Support from 04/15/2022 in [facility] from 04/10/2022 in [facility]  PHQ-2 Total Score 3 0 0 0 1  PHQ-9 Total Score 15 0 6 0 3      Flowsheet Row Clinical Support from 04/15/2022 in [facility] ED from 04/03/2022 in [facility] Department at [facility] ED from 11/26/2021 in [facility] Department at [facility]  C-SSRS RISK CATEGORY No Risk No Risk No Risk        Review of Systems:  Review of Systems  Musculoskeletal:  Negative for gait problem.  Neurological:  Negative for tremors.  Psychiatric/Behavioral:         Please refer to HPI    Medications: I have reviewed the patient's current medications.  Current Outpatient Medications  Medication Sig Dispense Refill   Acetylcysteine (N-ACETYL CYSTEINE) 600 MG CAPS 1 capsule Orally Twice a day     amLODipine (NORVASC) 5 MG tablet Take 1 tablet (5 mg total) by mouth daily. 90 tablet 1   atorvastatin (LIPITOR) 10 MG tablet TAKE 1 TABLET(10 MG) BY MOUTH DAILY 90 tablet 1   augmented betamethasone dipropionate (DIPROLENE-AF) 0.05 % cream APPLY TO THE AFFECTED AREAS OF LEG  TWICE DAILY AS NEEDED FOR RASH. NOT TO FACE, GROIN, UNDERARMS 50 g 0   B Complex Vitamins (VITAMIN B COMPLEX) TABS 1 tablet Orally Once daily     betamethasone dipropionate  0.05 % cream 1 application Externally Twice a day     Cholecalciferol 50 MCG (2000 UT) TABS 1 tablet Orally Once a day for 30 day(s)     Golimumab (SIMPONI ARIA IV) Infusion every 8 weeks     lamoTRIgine (LAMICTAL) 200 MG tablet Take 1 tablet (200 mg total) by mouth at bedtime. 90 tablet 3   levothyroxine (SYNTHROID) 50 MCG tablet Take 1 tablet (50 mcg total) by mouth daily. 30 tablet 6   LORazepam (ATIVAN) 1 MG tablet TAKE 1 TABLET(1 MG) BY MOUTH TWICE DAILY AS NEEDED 60 tablet 2   metroNIDAZOLE (METROGEL) 0.75 % gel Apply to face 1-2 times daily 45 g 1   Omega-3 Fatty Acids (FISH OIL) 1200 MG CAPS Take 1 capsule by mouth in the morning and at bedtime.     Probiotic Product (ALIGN) 4 MG CAPS Take 1 capsule by mouth daily in the afternoon.     sodium bicarbonate 650 MG tablet Take 650 mg by mouth 2 (two) times daily.     traZODone (DESYREL) 50 MG tablet Take 50 mg by mouth at bedtime as needed for sleep.     No current facility-administered medications for this visit.    Medication Side Effects: None  Allergies:  Allergies  Allergen Reactions   Aripiprazole Other (See Comments)    Parkinsonism      Lactose Intolerance (Gi) Diarrhea   Methotrexate Other (See Comments)    Hair loss, severe stomatitis     Cefdinir Diarrhea    Other reaction(s): Diarrhea Yeast infection and fever; negative c diff   Etanercept Other (See Comments)    Headaches     Exemestane Other (See Comments)    Suicidal thoughts with medication     Fluoxetine Other (See Comments)    Parkinsonism   Methylprednisolone Sodium Succ Other (See Comments)    Agitated mania   Epinephrine Palpitations    tachycardia    Nitrofurantoin Other (See Comments)     GI upset from 2012    Past Medical History:  Diagnosis Date   Bipolar 1 disorder (HCC)    Chronic diarrhea    loose stools twice a day on average for years   CKD stage G4/A1, 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 1.4 cm  Grade I invasive lobular, ER and PR +/Her--, Ki 67 <5% Tried anastrozole for one month, but developed suicidal idea   Osteoarthritis, knee 09/24/2019   Xray 09/2019   Parkinsonism    Drug induced   Polyposis of colon    Psoriatic arthritis (HCC)    PTSD (post-traumatic stress disorder)    Secondary hyperparathyroidism (HCC)    Tardive dyskinesia    Thyroid disease     Past Medical History, Surgical history, Social history, and Family history were reviewed and updated as appropriate.   Please see review of systems for further details on the patient's review from today.   Objective:   Physical Exam:  There were no vitals taken for this visit.  Physical Exam Constitutional:      General: She is not in  acute distress. Musculoskeletal:        General: No deformity.  Neurological:     Mental Status: She is alert and oriented to person, place, and time.     Coordination: Coordination normal.  Psychiatric:        Attention and Perception: Attention and perception normal. She does not perceive auditory or visual hallucinations.        Mood and Affect: Mood normal. Mood is not anxious or depressed. Affect is not labile, blunt, angry or inappropriate.        Speech: Speech normal.        Behavior: Behavior normal.        Thought Content: Thought content normal. Thought content is not paranoid or delusional. Thought content does not include homicidal or suicidal ideation. Thought content does not include homicidal or suicidal plan.        Cognition and Memory: Cognition and memory normal.        Judgment: Judgment normal.     Comments: Insight intact     Lab Review:     Component Value Date/Time   NA 141 07/12/2022 1108   NA 142 10/17/2021 0000   K 4.3 07/12/2022 1108   CL 105 07/12/2022 1108   CO2  26 07/12/2022 1108   GLUCOSE 69 (L) 07/12/2022 1108   BUN 32 (H) 07/12/2022 1108   BUN 30 (A) 10/17/2021 0000   CREATININE 2.27 (H) 07/12/2022 1108   CREATININE 2.29 (H) 05/22/2021 1044   CALCIUM 8.9 07/19/2022 1100   PROT 7.1 07/15/2022 1515   ALBUMIN 4.4 07/15/2022 1515   AST 25 07/15/2022 1515   ALT 29 07/15/2022 1515   ALKPHOS 138 (H) 07/15/2022 1515   BILITOT 0.9 07/15/2022 1515   GFRNONAA 22 (L) 04/03/2022 1425   GFRAA 25 (L) 09/13/2019 1243       Component Value Date/Time   WBC 7.7 07/12/2022 1108   RBC 4.18 07/12/2022 1108   HGB 12.9 07/12/2022 1108   HCT 38.6 07/12/2022 1108   PLT 201.0 07/12/2022 1108   MCV 92.3 07/12/2022 1108   MCH 30.5 04/03/2022 1425   MCHC 33.4 07/12/2022 1108   RDW 13.5 07/12/2022 1108   LYMPHSABS 3.4 07/12/2022 1108   MONOABS 0.9 07/12/2022 1108   EOSABS 0.3 07/12/2022 1108   BASOSABS 0.1 07/12/2022 1108    Lithium Lvl  Date Value Ref Range Status  05/22/2021 0.7 0.6 - 1.2 mmol/L Final     No results found for: "PHENYTOIN", "PHENOBARB", "VALPROATE", "CBMZ"   .res Assessment: Plan:    Diagnoses and all orders for this visit:  Bipolar I disorder (HCC)  Generalized anxiety disorder  PTSD (post-traumatic stress disorder)  Insomnia due to other mental disorder     Please see After Visit Summary for patient specific instructions.  Future Appointments  Date Time Provider Department Center  08/07/2022  1:40 PM Azhar Yogi, Thereasa Solo, NP CP-CP None  08/14/2022 11:40 AM Saturnino Liew, Thereasa Solo, NP CP-CP None  08/21/2022 11:40 AM Shameek Nyquist, Thereasa Solo, NP CP-CP None  08/28/2022 11:40 AM Liller Yohn, Thereasa Solo, NP CP-CP None  08/30/2022 11:30 AM [facility] ENDO[facility]NEURO LAB [facility] None  09/04/2022 11:40 AM Kaili Castille, Thereasa Solo, NP CP-CP None  11/12/2022 11:50 AM Shamleffer, Konrad Dolores, MD [facility] None  11/14/2022 11:30 AM Van Clines, MD LBN-[facility] None  04/24/2023  2:00 PM [facility] ANNUAL WELLNESS VISIT 1 [facility] PEC     No orders of the defined types were placed in this encounter.   -------------------------------

## 2022-08-07 ENCOUNTER — Ambulatory Visit (INDEPENDENT_AMBULATORY_CARE_PROVIDER_SITE_OTHER): Payer: Medicare Other | Admitting: Adult Health

## 2022-08-07 ENCOUNTER — Encounter: Payer: Self-pay | Admitting: Adult Health

## 2022-08-07 DIAGNOSIS — F319 Bipolar disorder, unspecified: Secondary | ICD-10-CM

## 2022-08-07 DIAGNOSIS — F411 Generalized anxiety disorder: Secondary | ICD-10-CM | POA: Diagnosis not present

## 2022-08-07 DIAGNOSIS — F4310 Post-traumatic stress disorder, unspecified: Secondary | ICD-10-CM | POA: Diagnosis not present

## 2022-08-07 DIAGNOSIS — F99 Mental disorder, not otherwise specified: Secondary | ICD-10-CM

## 2022-08-07 DIAGNOSIS — F5105 Insomnia due to other mental disorder: Secondary | ICD-10-CM | POA: Diagnosis not present

## 2022-08-07 NOTE — Progress Notes (Signed)
Tiffany [name] [id number] May 22, 1950 72 y.o.  Subjective:   Patient ID:  Tiffany [name] is a 72 y.o. (DOB September 07, 1950) female.  Chief Complaint: No chief complaint on file.   HPI Tiffany [name] presents to the office today for follow-up of PTSD, insomnia, GAD and  BPD 1.  Describes mood today as "ok". Pleasant. Reports tearfulness. Mood symptoms - reports a mix of depression anxiety and irritability. Reports worry, rumination, and over thinking. Reports feeling   overwhelmed at times. Reports racing thoughts. Reports rapid cycling - more mania, less depression. Mood is variable. Stating "I feel like I'm doing alright today". Feels like medications are helpful. Family supportive. Varying interest and motivation. Taking medications as prescribed. Working with therapist - [name].  Energy levels improved. Active, working towards a regular exercise routine. Enjoys some usual interests and activities. Married. Lives with husband their daughter. Talking to family and friends.  Appetite adequate. Weight loss - 15 pounds since December. Sleeps well most nights. Averages 8 to 9 hours. Denies recent daytime napping. Focus and concentration varies. Completing tasks. Managing some aspects of household. Retired.  Denies SI or HI.  Denies AH or VH. Denies self harm. Denies substance use.  Previous medications: Celexa, Zyprexa, Tegretol, Depakote, Serzone, Topamax, Seroquel, Effexor, Lexapro, Desipramine, Neurontin, Abilify, Geodon, Propanolol, Cymbalta, Cogentin, Trihexyphenadyl, Sinmmet, Provigil, Selegiline, Requip, Amantadine, Prozac, Mirapex, Azilect, Metoclopramide, Baclofen, Artane, Namenda, Latuda, Lithium,   GAD-7    Flowsheet Row Office Visit from 05/06/2022 in [facility] from 04/25/2022 in [facility] Visit from 04/10/2022 in [facility] Visit from 02/16/2021 in [facility] [employer] from 05/14/2019 in [facility]  Total GAD-7 Score 4 9 17 13 21       Mini-Mental    Flowsheet Row Office Visit from 06/07/2019 in [facility] Neurology  Total Score (max 30 points ) 29      PHQ2-9    Flowsheet Row Office Visit from 07/15/2022 in [facility] from 05/06/2022 in [facility] from 04/25/2022 in [facility] Clinical Support from 04/15/2022 in [facility] from 04/10/2022 in [facility]  PHQ-2 Total Score 3 0 0 0 1  PHQ-9 Total Score 15 0 6 0 3      Flowsheet Row Clinical Support from 04/15/2022 in [facility] ED from 04/03/2022 in [facility] Emergency Department at [facility] ED from 11/26/2021 in [facility] Emergency Department at [facility]  C-SSRS RISK CATEGORY No Risk No Risk No Risk        Review of Systems:  Review of Systems  Musculoskeletal:  Negative for gait problem.  Neurological:  Negative for tremors.  Psychiatric/Behavioral:         Please refer to HPI    Medications: I have reviewed the patient's current medications.  Current Outpatient Medications  Medication Sig Dispense Refill   Acetylcysteine (N-ACETYL CYSTEINE) 600 MG CAPS 1 capsule Orally Twice a day     amLODipine (NORVASC) 5 MG tablet Take 1 tablet (5 mg total) by mouth daily. 90 tablet 1   atorvastatin (LIPITOR) 10 MG tablet TAKE 1 TABLET(10 MG) BY MOUTH DAILY 90 tablet 1   augmented betamethasone dipropionate (DIPROLENE-AF) 0.05 % cream APPLY TO THE AFFECTED AREAS OF LEG TWICE DAILY AS NEEDED FOR RASH. NOT TO FACE, GROIN, UNDERARMS 50 g 0  B Complex Vitamins (VITAMIN B COMPLEX) TABS 1 tablet Orally Once daily     betamethasone dipropionate 0.05 % cream 1 application Externally Twice a day     Cholecalciferol 50 MCG (2000 UT) TABS 1 tablet Orally Once a day for  30 day(s)     Golimumab (SIMPONI ARIA IV) Infusion every 8 weeks     lamoTRIgine (LAMICTAL) 200 MG tablet Take 1 tablet (200 mg total) by mouth at bedtime. 90 tablet 3   levothyroxine (SYNTHROID) 50 MCG tablet Take 1 tablet (50 mcg total) by mouth daily. 30 tablet 6   LORazepam (ATIVAN) 1 MG tablet TAKE 1 TABLET(1 MG) BY MOUTH TWICE DAILY AS NEEDED 60 tablet 2   metroNIDAZOLE (METROGEL) 0.75 % gel Apply to face 1-2 times daily 45 g 1   Omega-3 Fatty Acids (FISH OIL) 1200 MG CAPS Take 1 capsule by mouth in the morning and at bedtime.     Probiotic Product (ALIGN) 4 MG CAPS Take 1 capsule by mouth daily in the afternoon.     sodium bicarbonate 650 MG tablet Take 650 mg by mouth 2 (two) times daily.     traZODone (DESYREL) 50 MG tablet Take 50 mg by mouth at bedtime as needed for sleep.     No current facility-administered medications for this visit.    Medication Side Effects: None  Allergies:  Allergies  Allergen Reactions   Aripiprazole Other (See Comments)    Parkinsonism      Lactose Intolerance (Gi) Diarrhea   Methotrexate Other (See Comments)    Hair loss, severe stomatitis     Cefdinir Diarrhea    Other reaction(s): Diarrhea Yeast infection and fever; negative c diff   Etanercept Other (See Comments)    Headaches     Exemestane Other (See Comments)    Suicidal thoughts with medication     Fluoxetine Other (See Comments)    Parkinsonism   Methylprednisolone Sodium Succ Other (See Comments)    Agitated mania   Epinephrine Palpitations    tachycardia    Nitrofurantoin Other (See Comments)     GI upset from 2012    Past Medical History:  Diagnosis Date   Bipolar 1 disorder (HCC)    Chronic diarrhea    loose stools twice a day on average for years   CKD stage G4/A1, 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 1.4 cm  Grade I invasive lobular, ER and PR +/Her--, Ki 67 <5% Tried anastrozole for one month, but developed suicidal idea   Osteoarthritis, knee 09/24/2019   Xray 09/2019   Parkinsonism    Drug induced   Polyposis of colon    Psoriatic arthritis (HCC)    PTSD (post-traumatic stress disorder)    Secondary hyperparathyroidism (HCC)    Tardive dyskinesia    Thyroid disease     Past Medical History, Surgical history, Social history, and Family history were reviewed and updated as appropriate.   Please see review of systems for further details on the patient's review from today.   Objective:   Physical Exam:  There were no vitals taken for this visit.  Physical Exam Constitutional:      General: She is not in acute distress. Musculoskeletal:        General: No deformity.  Neurological:  Mental Status: She is alert and oriented to person, place, and time.     Coordination: Coordination normal.  Psychiatric:        Attention and Perception: Attention and perception normal. She does not perceive auditory or visual hallucinations.        Mood and Affect: Mood normal. Mood is not anxious or depressed. Affect is not labile, blunt, angry or inappropriate.        Speech: Speech normal.        Behavior: Behavior normal.        Thought Content: Thought content normal. Thought content is not paranoid or delusional. Thought content does not include homicidal or suicidal ideation. Thought content does not include homicidal or suicidal plan.        Cognition and Memory: Cognition and memory normal.        Judgment: Judgment normal.     Comments: Insight intact     Lab Review:     Component Value Date/Time   NA 141 07/12/2022 1108   NA 142 10/17/2021 0000   K 4.3 07/12/2022 1108   CL 105 07/12/2022 1108   CO2 26 07/12/2022 1108   GLUCOSE 69 (L) 07/12/2022 1108   BUN 32 (H) 07/12/2022 1108   BUN 30 (A) 10/17/2021 0000    CREATININE 2.27 (H) 07/12/2022 1108   CREATININE 2.29 (H) 05/22/2021 1044   CALCIUM 8.9 07/19/2022 1100   PROT 7.1 07/15/2022 1515   ALBUMIN 4.4 07/15/2022 1515   AST 25 07/15/2022 1515   ALT 29 07/15/2022 1515   ALKPHOS 138 (H) 07/15/2022 1515   BILITOT 0.9 07/15/2022 1515   GFRNONAA 22 (L) 04/03/2022 1425   GFRAA 25 (L) 09/13/2019 1243       Component Value Date/Time   WBC 7.7 07/12/2022 1108   RBC 4.18 07/12/2022 1108   HGB 12.9 07/12/2022 1108   HCT 38.6 07/12/2022 1108   PLT 201.0 07/12/2022 1108   MCV 92.3 07/12/2022 1108   MCH 30.5 04/03/2022 1425   MCHC 33.4 07/12/2022 1108   RDW 13.5 07/12/2022 1108   LYMPHSABS 3.4 07/12/2022 1108   MONOABS 0.9 07/12/2022 1108   EOSABS 0.3 07/12/2022 1108   BASOSABS 0.1 07/12/2022 1108    Lithium Lvl  Date Value Ref Range Status  05/22/2021 0.7 0.6 - 1.2 mmol/L Final     No results found for: "PHENYTOIN", "PHENOBARB", "VALPROATE", "CBMZ"   .res Assessment: Plan:    Plan:  Lorazepam 1mg  BID for anxiety - may take one tablet extra for severe anxiety symptoms. Taking one at night routinely.  Lamictal 200mg  hs.  Using Trazadone as needed  NAC tablets BID - for obsessive thoughts, worry, and rumination - unable to take SSRI's  RTC weekly  Counseled patient regarding potential benefits, risks, and side effects of Lamictal to include potential risk of Stevens-Johnson syndrome. Advised patient to stop taking Lamictal and contact office immediately if rash develops and to seek urgent medical attention if rash is severe and/or spreading quickly.  Diagnoses and all orders for this visit:  Bipolar I disorder (HCC)  Generalized anxiety disorder  PTSD (post-traumatic stress disorder)  Insomnia due to other mental disorder     Please see After Visit Summary for patient specific instructions.  Future Appointments  Date Time Provider Department Center  08/14/2022 11:40 AM Alfonse Garringer, Thereasa Solo, NP CP-CP None  08/21/2022  11:40 AM Altamese Deguire, Thereasa Solo, NP CP-CP None  08/28/2022 11:40 AM Wallis Vancott, Thereasa Solo, NP CP-CP None  08/30/2022 11:30 AM  LB ENDO/NEURO LAB [facility] None  09/04/2022 11:40 AM Taner Rzepka, Thereasa Solo, NP CP-CP None  11/12/2022 11:50 AM Shamleffer, Konrad Dolores, MD [facility] None  11/14/2022 11:30 AM Van Clines, MD LBN[facility] None  04/24/2023  2:00 PM [facility] ANNUAL WELLNESS VISIT 1 [facility] PEC    No orders of the defined types were placed in this encounter.   -------------------------------

## 2022-08-08 ENCOUNTER — Encounter: Payer: Self-pay | Admitting: Internal Medicine

## 2022-08-08 DIAGNOSIS — R0600 Dyspnea, unspecified: Secondary | ICD-10-CM

## 2022-08-08 DIAGNOSIS — I200 Unstable angina: Secondary | ICD-10-CM

## 2022-08-08 DIAGNOSIS — R0609 Other forms of dyspnea: Secondary | ICD-10-CM

## 2022-08-08 DIAGNOSIS — R0602 Shortness of breath: Secondary | ICD-10-CM

## 2022-08-08 DIAGNOSIS — R079 Chest pain, unspecified: Secondary | ICD-10-CM

## 2022-08-09 MED ORDER — METOPROLOL TARTRATE 100 MG PO TABS: ORAL_TABLET | ORAL | 0 refills | Status: DC

## 2022-08-09 NOTE — Addendum Note (Signed)
Addended by: Bertram Millard on: 08/09/2022 11:09 AM   Modules accepted: Orders

## 2022-08-09 NOTE — Telephone Encounter (Signed)
OK to schedule

## 2022-08-14 ENCOUNTER — Encounter: Payer: Self-pay | Admitting: Adult Health

## 2022-08-14 ENCOUNTER — Ambulatory Visit (INDEPENDENT_AMBULATORY_CARE_PROVIDER_SITE_OTHER): Payer: Medicare Other | Admitting: Adult Health

## 2022-08-14 DIAGNOSIS — F319 Bipolar disorder, unspecified: Secondary | ICD-10-CM | POA: Diagnosis not present

## 2022-08-14 DIAGNOSIS — F99 Mental disorder, not otherwise specified: Secondary | ICD-10-CM

## 2022-08-14 DIAGNOSIS — F4310 Post-traumatic stress disorder, unspecified: Secondary | ICD-10-CM | POA: Diagnosis not present

## 2022-08-14 DIAGNOSIS — F5105 Insomnia due to other mental disorder: Secondary | ICD-10-CM

## 2022-08-14 DIAGNOSIS — F411 Generalized anxiety disorder: Secondary | ICD-10-CM

## 2022-08-14 NOTE — Progress Notes (Signed)
Tiffany [name] [id number] September 24, 1950 72 y.o.  Subjective:   Patient ID:  Tiffany [name] is a 72 y.o. (DOB Jul 31, 1950) female.  Chief Complaint: No chief complaint on file.   HPI Tiffany [name] presents to the office today for follow-up of PTSD, insomnia, GAD and  BPD 1.  Describes mood today as "ok". Pleasant. Reports tearfulness. Mood symptoms - reports cycling between depression anxiety and irritability. Reports worry, rumination, and over thinking. Reports hypomania. Reports racing thoughts. Cycling between brain fog and being alert. Mood is variable. Stating "I feel like I'm ok". Reports having a difficult week. Feels like medications are helpful. Family supportive. Varying interest and motivation. Taking medications as prescribed.  Energy levels improved. Active, has started going to the gym again - swimming. Enjoys some usual interests and activities. Married. Lives with husband their daughter. Talking to family and friends.  Appetite decreased - nauseated. Weight loss - 178 pounds. Sleeps well most nights. Averages 8 hours. Denies recent daytime napping. Focus and concentration difficulties. Completing tasks. Managing some aspects of household. Retired.  Denies SI or HI.  Denies AH or VH. Denies self harm. Denies substance use.  Previous medications: Celexa, Zyprexa, Tegretol, Depakote, Serzone, Topamax, Seroquel, Effexor, Lexapro, Desipramine, Neurontin, Abilify, Geodon, Propanolol, Cymbalta, Cogentin, Trihexyphenadyl, Sinmmet, Provigil, Selegiline, Requip, Amantadine, Prozac, Mirapex, Azilect, Metoclopramide, Baclofen, Artane, Namenda, Latuda, Lithium,   GAD-7      GAD-7    Flowsheet Row Office Visit from 05/06/2022 in [facility] from 04/25/2022 in [facility] Visit from 04/10/2022 in [facility] Visit from 02/16/2021 in [facility] from 05/14/2019 in  [facility]  Total GAD-7 Score 4 9 17 13 21       Mini-Mental    Flowsheet Row Office Visit from 06/07/2019 in [facility]  Total Score (max 30 points ) 29      PHQ2-9    Flowsheet Row Office Visit from 07/15/2022 in [facility] from 05/06/2022 in [facility] from 04/25/2022 in [facility] Clinical Support from 04/15/2022 in [facility] from 04/10/2022 in [facility]  PHQ-2 Total Score 3 0 0 0 1  PHQ-9 Total Score 15 0 6 0 3      Flowsheet Row Clinical Support from 04/15/2022 in [name] ED from 04/03/2022 in [facility] Emergency Department at [facility] ED from 11/26/2021 in [facility] Emergency Department at [facility]  C-SSRS RISK CATEGORY No Risk No Risk No Risk        Review of Systems:  Review of Systems  Musculoskeletal:  Negative for gait problem.  Neurological:  Negative for tremors.  Psychiatric/Behavioral:         Please refer to HPI    Medications: I have reviewed the patient's current medications.  Current Outpatient Medications  Medication Sig Dispense Refill   Acetylcysteine (N-ACETYL CYSTEINE) 600 MG CAPS 1 capsule Orally Twice a day     amLODipine (NORVASC) 5 MG tablet Take 1 tablet (5 mg total) by mouth daily. 90 tablet 1   atorvastatin (LIPITOR) 10 MG tablet TAKE 1 TABLET(10 MG) BY MOUTH DAILY 90 tablet 1   augmented betamethasone dipropionate (DIPROLENE-AF) 0.05 % cream APPLY TO THE AFFECTED AREAS OF LEG TWICE DAILY AS NEEDED FOR RASH. NOT TO FACE, GROIN, UNDERARMS 50 g 0   B Complex Vitamins (  VITAMIN B COMPLEX) TABS 1 tablet Orally Once daily     betamethasone dipropionate 0.05 % cream 1 application Externally Twice a day     Cholecalciferol 50 MCG (2000 UT) TABS 1 tablet Orally Once a day for 30 day(s)     Golimumab  (SIMPONI ARIA IV) Infusion every 8 weeks     lamoTRIgine (LAMICTAL) 200 MG tablet Take 1 tablet (200 mg total) by mouth at bedtime. 90 tablet 3   levothyroxine (SYNTHROID) 50 MCG tablet Take 1 tablet (50 mcg total) by mouth daily. 30 tablet 6   LORazepam (ATIVAN) 1 MG tablet TAKE 1 TABLET(1 MG) BY MOUTH TWICE DAILY AS NEEDED 60 tablet 2   metroNIDAZOLE (METROGEL) 0.75 % gel Apply to face 1-2 times daily 45 g 1   Omega-3 Fatty Acids (FISH OIL) 1200 MG CAPS Take 1 capsule by mouth in the morning and at bedtime.     Probiotic Product (ALIGN) 4 MG CAPS Take 1 capsule by mouth daily in the afternoon.     sodium bicarbonate 650 MG tablet Take 650 mg by mouth 2 (two) times daily.     traZODone (DESYREL) 50 MG tablet Take 50 mg by mouth at bedtime as needed for sleep.     No current facility-administered medications for this visit.    Medication Side Effects: None  Allergies:  Allergies  Allergen Reactions   Aripiprazole Other (See Comments)    Parkinsonism      Lactose Intolerance (Gi) Diarrhea   Methotrexate Other (See Comments)    Hair loss, severe stomatitis     Cefdinir Diarrhea    Other reaction(s): Diarrhea Yeast infection and fever; negative c diff   Etanercept Other (See Comments)    Headaches     Exemestane Other (See Comments)    Suicidal thoughts with medication     Fluoxetine Other (See Comments)    Parkinsonism   Methylprednisolone Sodium Succ Other (See Comments)    Agitated mania   Epinephrine Palpitations    tachycardia    Nitrofurantoin Other (See Comments)     GI upset from 2012    Past Medical History:  Diagnosis Date   Bipolar 1 disorder (HCC)    Chronic diarrhea    loose stools twice a day on average for years   CKD stage G4/A1, 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 1.4 cm  Grade I invasive lobular, ER and PR +/Her--, Ki 67 <5% Tried anastrozole for one month, but developed suicidal idea   Osteoarthritis, knee 09/24/2019   Xray 09/2019   Parkinsonism    Drug induced   Polyposis of colon    Psoriatic arthritis (HCC)    PTSD (post-traumatic stress disorder)    Secondary hyperparathyroidism (HCC)    Tardive dyskinesia    Thyroid disease     Past Medical History, Surgical history, Social history, and Family history were reviewed and updated as appropriate.   Please see review of systems for further details on the patient's review from today.   Objective:   Physical Exam:  There were no vitals taken for this visit.  Physical Exam Constitutional:      General: She is not in acute distress. Musculoskeletal:        General: No deformity.  Neurological:  Mental Status: She is alert and oriented to person, place, and time.     Coordination: Coordination normal.  Psychiatric:        Attention and Perception: Attention and perception normal. She does not perceive auditory or visual hallucinations.        Mood and Affect: Mood normal. Mood is not anxious or depressed. Affect is not labile, blunt, angry or inappropriate.        Speech: Speech normal.        Behavior: Behavior normal.        Thought Content: Thought content normal. Thought content is not paranoid or delusional. Thought content does not include homicidal or suicidal ideation. Thought content does not include homicidal or suicidal plan.        Cognition and Memory: Cognition and memory normal.        Judgment: Judgment normal.     Comments: Insight intact     Lab Review:     Component Value Date/Time   NA 141 07/12/2022 1108   NA 142 10/17/2021 0000   K 4.3 07/12/2022 1108   CL 105 07/12/2022 1108   CO2 26 07/12/2022 1108   GLUCOSE 69 (L) 07/12/2022 1108   BUN 32 (H) 07/12/2022 1108   BUN 30 (A) 10/17/2021 0000   CREATININE 2.27 (H)  07/12/2022 1108   CREATININE 2.29 (H) 05/22/2021 1044   CALCIUM 8.9 07/19/2022 1100   PROT 7.1 07/15/2022 1515   ALBUMIN 4.4 07/15/2022 1515   AST 25 07/15/2022 1515   ALT 29 07/15/2022 1515   ALKPHOS 138 (H) 07/15/2022 1515   BILITOT 0.9 07/15/2022 1515   GFRNONAA 22 (L) 04/03/2022 1425   GFRAA 25 (L) 09/13/2019 1243       Component Value Date/Time   WBC 7.7 07/12/2022 1108   RBC 4.18 07/12/2022 1108   HGB 12.9 07/12/2022 1108   HCT 38.6 07/12/2022 1108   PLT 201.0 07/12/2022 1108   MCV 92.3 07/12/2022 1108   MCH 30.5 04/03/2022 1425   MCHC 33.4 07/12/2022 1108   RDW 13.5 07/12/2022 1108   LYMPHSABS 3.4 07/12/2022 1108   MONOABS 0.9 07/12/2022 1108   EOSABS 0.3 07/12/2022 1108   BASOSABS 0.1 07/12/2022 1108    Lithium Lvl  Date Value Ref Range Status  05/22/2021 0.7 0.6 - 1.2 mmol/L Final     No results found for: "PHENYTOIN", "PHENOBARB", "VALPROATE", "CBMZ"   .res Assessment: Plan:    Plan:  Lorazepam 1mg  BID for anxiety - may take one tablet extra for severe anxiety symptoms. Taking one at night routinely.  Lamictal 200mg  hs.  Using Trazadone as needed  NAC tablets BID - for obsessive thoughts, worry, and rumination - unable to take SSRI's  RTC weekly  Counseled patient regarding potential benefits, risks, and side effects of Lamictal to include potential risk of Stevens-Johnson syndrome. Advised patient to stop taking Lamictal and contact office immediately if rash develops and to seek urgent medical attention if rash is severe and/or spreading quickly.  There are no diagnoses linked to this encounter.   Please see After Visit Summary for patient specific instructions.  Future Appointments  Date Time Provider Department Center  08/14/2022 11:40 AM Suzana Sohail, Thereasa Solo, NP CP-[facility] None  08/21/2022 11:40 AM Jaidyn Kuhl, Thereasa Solo, NP CP-[facility] None  08/28/2022 11:40 AM Anquan Azzarello, Thereasa Solo, NP CP-[facility] None  08/30/2022 11:30 AM LB ENDO/NEURO LAB  [facility] None  09/04/2022 11:40 AM Deyci Gesell, Thereasa Solo, NP CP-CP None  10/29/2022  1:20 PM Dietrich Pates  V, MD [facility]  11/12/2022 11:50 AM Shamleffer, Konrad Dolores, MD [facility] None  11/14/2022 11:30 AM Van Clines, MD LBN[facility] None  04/24/2023  2:00 PM [facility] ANNUAL WELLNESS VISIT 1 [facility] P[facility]    No orders of the defined types were placed in this encounter.   -------------------------------

## 2022-08-21 ENCOUNTER — Encounter: Payer: Self-pay | Admitting: Adult Health

## 2022-08-21 ENCOUNTER — Ambulatory Visit (INDEPENDENT_AMBULATORY_CARE_PROVIDER_SITE_OTHER): Payer: Medicare Other | Admitting: Adult Health

## 2022-08-21 DIAGNOSIS — F319 Bipolar disorder, unspecified: Secondary | ICD-10-CM

## 2022-08-21 DIAGNOSIS — F4310 Post-traumatic stress disorder, unspecified: Secondary | ICD-10-CM | POA: Diagnosis not present

## 2022-08-21 DIAGNOSIS — F99 Mental disorder, not otherwise specified: Secondary | ICD-10-CM

## 2022-08-21 DIAGNOSIS — F411 Generalized anxiety disorder: Secondary | ICD-10-CM | POA: Diagnosis not present

## 2022-08-21 DIAGNOSIS — F5105 Insomnia due to other mental disorder: Secondary | ICD-10-CM

## 2022-08-21 NOTE — Progress Notes (Signed)
Tiffany [name] [id number] [birth year] 72 y.o.  Subjective:   Patient ID:  Tiffany [name] is a 72 y.o. (DOB 1950-11-26) female.  Chief Complaint: No chief complaint on file.   HPI Tiffany [name] presents to the office today for follow-up of PTSD, insomnia, GAD and  BPD 1.  Describes mood today as "ok". Pleasant. Reports tearfulness. Mood symptoms - reports cycling between depression, anxiety, and irritability. Reports hypomania. Reports racing thoughts. Reports worry, rumination, and over thinking. Has felt overwhelmed at times - changes. Mood is up and down. Stating "I feel like overall, I'm doing well". Feels like medications are helpful. Family supportive. Varying interest and motivation. Taking medications as prescribed.  Energy levels improved. Active, exercising- swimming. Enjoys some usual interests and activities. Married. Lives with husband their daughter. Talking to family and friends.  Appetite increased - nauseated. Weight loss - 178 pounds. Sleep is broken. Averages 8 hours. Denies recent daytime napping. Focus and concentration difficulties - reports cycling of thoughts - brain fog. Completing tasks. Managing some aspects of household. Retired.  Denies SI or HI.  Denies AH or VH. Denies self harm. Denies substance use.  Previous medications: Celexa, Zyprexa, Tegretol, Depakote, Serzone, Topamax, Seroquel, Effexor, Lexapro, Desipramine, Neurontin, Abilify, Geodon, Propanolol, Cymbalta, Cogentin, Trihexyphenadyl, Sinmmet, Provigil, Selegiline, Requip, Amantadine, Prozac, Mirapex, Azilect, Metoclopramide, Baclofen, Artane, Namenda, Latuda, Lithium,    GAD-7    Flowsheet Row Office Visit from 05/06/2022 in [facility] from 04/25/2022 in [facility] Visit from 04/10/2022 in [facility] Visit from 02/16/2021 in [facility] from 05/14/2019 in [facility]  Total GAD-7 Score 4 9 17 13 21       Mini-Mental    Flowsheet Row Office Visit from 06/07/2019 in [facility]rology  Total Score (max 30 points ) 29      PHQ2-9    Flowsheet Row Office Visit from 07/15/2022 in [facility] from 05/06/2022 in [facility] from 04/25/2022 in [facility] Clinical Support from 04/15/2022 in [facility] from 04/10/2022 in [facility]  PHQ-2 Total Score 3 0 0 0 1  PHQ-9 Total Score 15 0 6 0 3      Flowsheet Row Clinical Support from 04/15/2022 in [facility] ED from 04/03/2022 in [facility] Emergency Department at [facility] ED from 11/26/2021 in [facility] Emergency Department at [facility]  C-SSRS RISK CATEGORY No Risk No Risk No Risk        Review of Systems:  Review of Systems  Musculoskeletal:  Negative for gait problem.  Neurological:  Negative for tremors.  Psychiatric/Behavioral:         Please refer to HPI    Medications: I have reviewed the patient's current medications.  Current Outpatient Medications  Medication Sig Dispense Refill   Acetylcysteine (N-ACETYL CYSTEINE) 600 MG CAPS 1 capsule Orally Twice a day     amLODipine (NORVASC) 5 MG tablet Take 1 tablet (5 mg total) by mouth daily. 90 tablet 1   atorvastatin (LIPITOR) 10 MG tablet TAKE 1 TABLET(10 MG) BY MOUTH DAILY 90 tablet 1   augmented betamethasone dipropionate (DIPROLENE-AF) 0.05 % cream APPLY TO THE AFFECTED AREAS OF LEG TWICE DAILY AS NEEDED FOR RASH. NOT TO FACE, GROIN, UNDERARMS 50 g 0   B Complex Vitamins (VITAMIN B COMPLEX) TABS 1 tablet  Orally Once daily     betamethasone dipropionate 0.05 % cream 1 application Externally Twice a day     Cholecalciferol 50 MCG (2000 UT) TABS 1 tablet Orally Once a day for 30 day(s)     Golimumab (SIMPONI ARIA  IV) Infusion every 8 weeks     lamoTRIgine (LAMICTAL) 200 MG tablet Take 1 tablet (200 mg total) by mouth at bedtime. 90 tablet 3   levothyroxine (SYNTHROID) 50 MCG tablet Take 1 tablet (50 mcg total) by mouth daily. 30 tablet 6   LORazepam (ATIVAN) 1 MG tablet TAKE 1 TABLET(1 MG) BY MOUTH TWICE DAILY AS NEEDED 60 tablet 2   metroNIDAZOLE (METROGEL) 0.75 % gel Apply to face 1-2 times daily 45 g 1   Omega-3 Fatty Acids (FISH OIL) 1200 MG CAPS Take 1 capsule by mouth in the morning and at bedtime.     Probiotic Product (ALIGN) 4 MG CAPS Take 1 capsule by mouth daily in the afternoon.     sodium bicarbonate 650 MG tablet Take 650 mg by mouth 2 (two) times daily.     traZODone (DESYREL) 50 MG tablet Take 50 mg by mouth at bedtime as needed for sleep.     No current facility-administered medications for this visit.    Medication Side Effects: None  Allergies:  Allergies  Allergen Reactions   Aripiprazole Other (See Comments)    Parkinsonism      Lactose Intolerance (Gi) Diarrhea   Methotrexate Other (See Comments)    Hair loss, severe stomatitis     Cefdinir Diarrhea    Other reaction(s): Diarrhea Yeast infection and fever; negative c diff   Etanercept Other (See Comments)    Headaches     Exemestane Other (See Comments)    Suicidal thoughts with medication     Fluoxetine Other (See Comments)    Parkinsonism   Methylprednisolone Sodium Succ Other (See Comments)    Agitated mania   Epinephrine Palpitations    tachycardia    Nitrofurantoin Other (See Comments)     GI upset from 2012    Past Medical History:  Diagnosis Date   Bipolar 1 disorder (HCC)    Chronic diarrhea    loose stools twice a day on average for years   CKD stage G4/A1, 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 1.4 cm  Grade I invasive lobular, ER and PR +/Her--, Ki 67 <5% Tried anastrozole for one month, but developed suicidal idea   Osteoarthritis, knee 09/24/2019   Xray 09/2019   Parkinsonism    Drug induced   Polyposis of colon    Psoriatic arthritis (HCC)    PTSD (post-traumatic stress disorder)    Secondary hyperparathyroidism (HCC)    Tardive dyskinesia    Thyroid disease     Past Medical History, Surgical history, Social history, and Family history were reviewed and updated as appropriate.   Please see review of systems for further details on the patient's review from today.   Objective:   Physical Exam:  There were no vitals taken for this visit.  Physical Exam Constitutional:      General: She is not in acute distress. Musculoskeletal:        General: No deformity.  Neurological:     Mental Status: She is alert and  oriented to person, place, and time.     Coordination: Coordination normal.  Psychiatric:        Attention and Perception: Attention and perception normal. She does not perceive auditory or visual hallucinations.        Mood and Affect: Mood normal. Mood is not anxious or depressed. Affect is not labile, blunt, angry or inappropriate.        Speech: Speech normal.        Behavior: Behavior normal.        Thought Content: Thought content normal. Thought content is not paranoid or delusional. Thought content does not include homicidal or suicidal ideation. Thought content does not include homicidal or suicidal plan.        Cognition and Memory: Cognition and memory normal.        Judgment: Judgment normal.     Comments: Insight intact     Lab Review:     Component Value Date/Time   NA 141 07/12/2022 1108   NA 142 10/17/2021 0000   K 4.3 07/12/2022 1108   CL 105 07/12/2022 1108   CO2 26 07/12/2022 1108   GLUCOSE 69 (L) 07/12/2022 1108   BUN 32 (H) 07/12/2022 1108   BUN 30 (A) 10/17/2021 0000   CREATININE 2.27 (H) 07/12/2022 1108    CREATININE 2.29 (H) 05/22/2021 1044   CALCIUM 8.9 07/19/2022 1100   PROT 7.1 07/15/2022 1515   ALBUMIN 4.4 07/15/2022 1515   AST 25 07/15/2022 1515   ALT 29 07/15/2022 1515   ALKPHOS 138 (H) 07/15/2022 1515   BILITOT 0.9 07/15/2022 1515   GFRNONAA 22 (L) 04/03/2022 1425   GFRAA 25 (L) 09/13/2019 1243       Component Value Date/Time   WBC 7.7 07/12/2022 1108   RBC 4.18 07/12/2022 1108   HGB 12.9 07/12/2022 1108   HCT 38.6 07/12/2022 1108   PLT 201.0 07/12/2022 1108   MCV 92.3 07/12/2022 1108   MCH 30.5 04/03/2022 1425   MCHC 33.4 07/12/2022 1108   RDW 13.5 07/12/2022 1108   LYMPHSABS 3.4 07/12/2022 1108   MONOABS 0.9 07/12/2022 1108   EOSABS 0.3 07/12/2022 1108   BASOSABS 0.1 07/12/2022 1108    Lithium Lvl  Date Value Ref Range Status  05/22/2021 0.7 0.6 - 1.2 mmol/L Final     No results found for: "PHENYTOIN", "PHENOBARB", "VALPROATE", "CBMZ"   .res Assessment: Plan:    Plan:  Lorazepam 1mg  BID for anxiety - may take one tablet extra for severe anxiety symptoms. Taking one at night routinely.  Lamictal 200mg  hs.  Using Trazadone as needed  NAC tablets BID - for obsessive thoughts, worry, and rumination - unable to take SSRI's  RTC weekly  Counseled patient regarding potential benefits, risks, and side effects of Lamictal to include potential risk of Stevens-Johnson syndrome. Advised patient to stop taking Lamictal and contact office immediately if rash develops and to seek urgent medical attention if rash is severe and/or spreading quickly.  Diagnoses and all orders for this visit:  Bipolar I disorder (HCC)  Generalized anxiety disorder  PTSD (post-traumatic stress disorder)  Insomnia due to other mental disorder     Please see After Visit Summary for patient specific instructions.  Future Appointments  Date Time Provider Department Center  08/30/2022 11:30 AM LB ENDO/NEURO LAB [facility] None  08/30/2022  3:00 PM Romina Divirgilio, Thereasa Solo, NP CP-CP  None  09/04/2022 11:40 AM Lenah Messenger, Thereasa Solo, NP CP-CP None  10/29/2022  1:20 PM Pricilla Riffle, MD [facility]  11/12/2022 11:50 AM Shamleffer, Konrad Dolores, MD [facility] None  11/14/2022 11:30 AM Van Clines, MD [facility] None  04/24/2023  2:00 PM [facility] ANNUAL WELLNESS VISIT 1 [facility] PEC    No orders of the defined types were placed in this encounter.   -------------------------------

## 2022-08-27 ENCOUNTER — Other Ambulatory Visit: Payer: Medicare Other

## 2022-08-27 ENCOUNTER — Other Ambulatory Visit: Payer: Self-pay

## 2022-08-27 DIAGNOSIS — F411 Generalized anxiety disorder: Secondary | ICD-10-CM

## 2022-08-27 MED ORDER — LORAZEPAM 1 MG PO TABS
ORAL_TABLET | ORAL | 2 refills | Status: DC
Start: 2022-08-27 — End: 2022-12-30

## 2022-08-28 ENCOUNTER — Ambulatory Visit: Payer: Medicare Other | Admitting: Adult Health

## 2022-08-30 ENCOUNTER — Encounter: Payer: Self-pay | Admitting: Adult Health

## 2022-08-30 ENCOUNTER — Ambulatory Visit (INDEPENDENT_AMBULATORY_CARE_PROVIDER_SITE_OTHER): Payer: Medicare Other | Admitting: Adult Health

## 2022-08-30 ENCOUNTER — Other Ambulatory Visit: Payer: Medicare Other

## 2022-08-30 DIAGNOSIS — F411 Generalized anxiety disorder: Secondary | ICD-10-CM

## 2022-08-30 DIAGNOSIS — F319 Bipolar disorder, unspecified: Secondary | ICD-10-CM | POA: Diagnosis not present

## 2022-08-30 DIAGNOSIS — F5105 Insomnia due to other mental disorder: Secondary | ICD-10-CM | POA: Diagnosis not present

## 2022-08-30 DIAGNOSIS — F99 Mental disorder, not otherwise specified: Secondary | ICD-10-CM

## 2022-08-30 DIAGNOSIS — F4310 Post-traumatic stress disorder, unspecified: Secondary | ICD-10-CM

## 2022-08-30 NOTE — Progress Notes (Signed)
Tiffany [name] [id number] [birth year] 72 y.o.  Subjective:   Patient ID:  Tiffany [name] is a 72 y.o. (DOB 1950-04-21) female.  Chief Complaint: No chief complaint on file.   HPI Tiffany [name] presents to the office today for follow-up of PTSD, insomnia, GAD and  BPD 1.  Describes mood today as "ok". Pleasant. Reports tearfulness. Mood symptoms - reports cycling between depression, anxiety, and irritability - "more intense feelings". Feeling conflicted - "contrast of emotions". Feeling caustrophobic in certain situations.  Reports worry, rumination, and over thinking. Feels overwhelmed at times. Reports hypomania. Reports racing thoughts. Mood is up and down. Stating "I don't feel like I'm doing good - worthless". Feels like medications are helpful. Family supportive. Varying interest and motivation. Taking medications as prescribed.  Energy levels improved. Active, exercising. Enjoys some usual interests and activities. Married. Lives with husband their daughter. Talking to family and friends.  Appetite increased - nauseated. Weight loss - 178 pounds. Sleep is inconsistent. Averages 8 hours. Reports some daytime napping. Focus and concentration difficulties - reports cycling of thoughts - brain fog. Completing tasks. Managing some aspects of household. Retired.  Denies SI or HI.  Denies AH or VH. Denies self harm. Denies substance use.  Previous medications: Celexa, Zyprexa, Tegretol, Depakote, Serzone, Topamax, Seroquel, Effexor, Lexapro, Desipramine, Neurontin, Abilify, Geodon, Propanolol, Cymbalta, Cogentin, Trihexyphenadyl, Sinmmet, Provigil, Selegiline, Requip, Amantadine, Prozac, Mirapex, Azilect, Metoclopramide, Baclofen, Artane, Namenda, Latuda, Lithium,     GAD-7    Flowsheet Row Office Visit from 05/06/2022 in [facility] from 04/25/2022 in [facility] Visit from 04/10/2022 in [facility]  Visit from 02/16/2021 in [facility] from 05/14/2019 in [facility]  Total GAD-7 Score 4 9 17 13 21       Mini-Mental    Flowsheet Row Office Visit from 06/07/2019 in [facility] Neurology  Total Score (max 30 points ) 29      PHQ2-9    Flowsheet Row Office Visit from 07/15/2022 in [facility] from 05/06/2022 in [facility] from 04/25/2022 in [facility] Clinical Support from 04/15/2022 in [facility] from 04/10/2022 in [facility]  PHQ-2 Total Score 3 0 0 0 1  PHQ-9 Total Score 15 0 6 0 3      Flowsheet Row Clinical Support from 04/15/2022 in [facility] ED from 04/03/2022 in [facility] Emergency Department at [facility] ED from 11/26/2021 in [facility] Department at [facility]  C-SSRS RISK CATEGORY No Risk No Risk No Risk        Review of Systems:  Review of Systems  Musculoskeletal:  Negative for gait problem.  Neurological:  Negative for tremors.  Psychiatric/Behavioral:         Please refer to HPI    Medications: I have reviewed the patient's current medications.  Current Outpatient Medications  Medication Sig Dispense Refill   Acetylcysteine (N-ACETYL CYSTEINE) 600 MG CAPS 1 capsule Orally Twice a day     amLODipine (NORVASC) 5 MG tablet Take 1 tablet (5 mg total) by mouth daily. 90 tablet 1   atorvastatin (LIPITOR) 10 MG tablet TAKE 1 TABLET(10 MG) BY MOUTH DAILY 90 tablet 1   augmented betamethasone dipropionate (DIPROLENE-AF) 0.05 % cream APPLY TO THE AFFECTED AREAS OF LEG TWICE DAILY AS NEEDED FOR RASH. NOT TO FACE, GROIN,  UNDERARMS 50 g 0   B Complex Vitamins (VITAMIN B COMPLEX) TABS 1 tablet Orally Once daily     betamethasone dipropionate 0.05 % cream 1 application Externally Twice a day      Cholecalciferol 50 MCG (2000 UT) TABS 1 tablet Orally Once a day for 30 day(s)     Golimumab (SIMPONI ARIA IV) Infusion every 8 weeks     lamoTRIgine (LAMICTAL) 200 MG tablet Take 1 tablet (200 mg total) by mouth at bedtime. 90 tablet 3   levothyroxine (SYNTHROID) 50 MCG tablet Take 1 tablet (50 mcg total) by mouth daily. 30 tablet 6   LORazepam (ATIVAN) 1 MG tablet TAKE 1 TABLET(1 MG) BY MOUTH TWICE DAILY AS NEEDED 60 tablet 2   metroNIDAZOLE (METROGEL) 0.75 % gel Apply to face 1-2 times daily 45 g 1   Omega-3 Fatty Acids (FISH OIL) 1200 MG CAPS Take 1 capsule by mouth in the morning and at bedtime.     Probiotic Product (ALIGN) 4 MG CAPS Take 1 capsule by mouth daily in the afternoon.     sodium bicarbonate 650 MG tablet Take 650 mg by mouth 2 (two) times daily.     traZODone (DESYREL) 50 MG tablet Take 50 mg by mouth at bedtime as needed for sleep.     No current facility-administered medications for this visit.    Medication Side Effects: None  Allergies:  Allergies  Allergen Reactions   Aripiprazole Other (See Comments)    Parkinsonism      Lactose Intolerance (Gi) Diarrhea   Methotrexate Other (See Comments)    Hair loss, severe stomatitis     Cefdinir Diarrhea    Other reaction(s): Diarrhea Yeast infection and fever; negative c diff   Etanercept Other (See Comments)    Headaches     Exemestane Other (See Comments)    Suicidal thoughts with medication     Fluoxetine Other (See Comments)    Parkinsonism   Methylprednisolone Sodium Succ Other (See Comments)    Agitated mania   Epinephrine Palpitations    tachycardia    Nitrofurantoin Other (See Comments)     GI upset from 2012    Past Medical History:  Diagnosis Date   Bipolar 1 disorder (HCC)    Chronic diarrhea    loose stools twice a day on average for years   CKD stage G4/A1, 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 1.4 cm  Grade I invasive lobular, ER and PR +/Her--, Ki 67 <5% Tried anastrozole for one month, but developed suicidal idea   Osteoarthritis, knee 09/24/2019   Xray 09/2019   Parkinsonism    Drug induced   Polyposis of colon    Psoriatic arthritis (HCC)    PTSD (post-traumatic stress disorder)    Secondary hyperparathyroidism (HCC)    Tardive dyskinesia    Thyroid disease     Past Medical History, Surgical history, Social history, and Family history were reviewed and updated as appropriate.   Please see review of systems for further details on the patient's review from today.   Objective:   Physical Exam:  There were no vitals taken for this visit.  Physical Exam Constitutional:      General: She is not in acute distress. Musculoskeletal:  General: No deformity.  Neurological:     Mental Status: She is alert and oriented to person, place, and time.     Coordination: Coordination normal.  Psychiatric:        Attention and Perception: Attention and perception normal. She does not perceive auditory or visual hallucinations.        Mood and Affect: Mood normal. Mood is not anxious or depressed. Affect is not labile, blunt, angry or inappropriate.        Speech: Speech normal.        Behavior: Behavior normal.        Thought Content: Thought content normal. Thought content is not paranoid or delusional. Thought content does not include homicidal or suicidal ideation. Thought content does not include homicidal or suicidal plan.        Cognition and Memory: Cognition and memory normal.        Judgment: Judgment normal.     Comments: Insight intact     Lab Review:     Component Value Date/Time   NA 141 07/12/2022 1108   NA 142 10/17/2021 0000   K 4.3 07/12/2022 1108   CL 105 07/12/2022 1108   CO2 26 07/12/2022 1108   GLUCOSE 69 (L) 07/12/2022 1108    BUN 32 (H) 07/12/2022 1108   BUN 30 (A) 10/17/2021 0000   CREATININE 2.27 (H) 07/12/2022 1108   CREATININE 2.29 (H) 05/22/2021 1044   CALCIUM 8.9 07/19/2022 1100   PROT 7.1 07/15/2022 1515   ALBUMIN 4.4 07/15/2022 1515   AST 25 07/15/2022 1515   ALT 29 07/15/2022 1515   ALKPHOS 138 (H) 07/15/2022 1515   BILITOT 0.9 07/15/2022 1515   GFRNONAA 22 (L) 04/03/2022 1425   GFRAA 25 (L) 09/13/2019 1243       Component Value Date/Time   WBC 7.7 07/12/2022 1108   RBC 4.18 07/12/2022 1108   HGB 12.9 07/12/2022 1108   HCT 38.6 07/12/2022 1108   PLT 201.0 07/12/2022 1108   MCV 92.3 07/12/2022 1108   MCH 30.5 04/03/2022 1425   MCHC 33.4 07/12/2022 1108   RDW 13.5 07/12/2022 1108   LYMPHSABS 3.4 07/12/2022 1108   MONOABS 0.9 07/12/2022 1108   EOSABS 0.3 07/12/2022 1108   BASOSABS 0.1 07/12/2022 1108    Lithium Lvl  Date Value Ref Range Status  05/22/2021 0.7 0.6 - 1.2 mmol/L Final     No results found for: "PHENYTOIN", "PHENOBARB", "VALPROATE", "CBMZ"   .res Assessment: Plan:    Plan:  Lorazepam 1mg  BID for anxiety - may take one tablet extra for severe anxiety symptoms. Taking one at night routinely.  Lamictal 200mg  hs.  Using Trazadone 50mg  as needed  NAC tablets BID - for obsessive thoughts, worry, and rumination - unable to take SSRI's  RTC weekly  Counseled patient regarding potential benefits, risks, and side effects of Lamictal to include potential risk of Stevens-Johnson syndrome. Advised patient to stop taking Lamictal and contact office immediately if rash develops and to seek urgent medical attention if rash is severe and/or spreading quickly.  There are no diagnoses linked to this encounter.   Please see After Visit Summary for patient specific instructions.  Future Appointments  Date Time Provider Department Center  09/03/2022  1:45 PM LB ENDO/NEURO LAB [facility] None  09/04/2022 11:40 AM Suri Tafolla, Thereasa Solo, NP CP-CP None  10/29/2022  1:20 PM  Pricilla Riffle, MD [facility]  11/12/2022 11:50 AM Shamleffer, Konrad Dolores, MD [facility] None  11/14/2022 11:30 AM Karel Jarvis,  Lesle Chris, MD [facility] None  04/24/2023  2:00 PM [facility] ANNUAL WELLNESS VISIT 1 [facility] P[facility]    No orders of the defined types were placed in this encounter.   -------------------------------

## 2022-09-03 ENCOUNTER — Other Ambulatory Visit: Payer: Medicare Other

## 2022-09-03 ENCOUNTER — Encounter: Payer: Self-pay | Admitting: Adult Health

## 2022-09-03 ENCOUNTER — Ambulatory Visit (INDEPENDENT_AMBULATORY_CARE_PROVIDER_SITE_OTHER): Payer: Medicare Other | Admitting: Adult Health

## 2022-09-03 DIAGNOSIS — F411 Generalized anxiety disorder: Secondary | ICD-10-CM | POA: Diagnosis not present

## 2022-09-03 DIAGNOSIS — F5105 Insomnia due to other mental disorder: Secondary | ICD-10-CM | POA: Diagnosis not present

## 2022-09-03 DIAGNOSIS — F4310 Post-traumatic stress disorder, unspecified: Secondary | ICD-10-CM | POA: Diagnosis not present

## 2022-09-03 DIAGNOSIS — F319 Bipolar disorder, unspecified: Secondary | ICD-10-CM | POA: Diagnosis not present

## 2022-09-03 DIAGNOSIS — F99 Mental disorder, not otherwise specified: Secondary | ICD-10-CM

## 2022-09-03 NOTE — Progress Notes (Signed)
Tiffany [name] [id number] 1950/05/19 72 y.o.  Subjective:   Patient ID:  Tiffany [name] is a 72 y.o. (DOB 02/24/1951) female.  Chief Complaint: No chief complaint on file.   HPI [name] presents to the office today for follow-up of PTSD, insomnia, GAD and  BPD 1.  Describes mood today as "ok". Pleasant. Reports tearfulness. Mood symptoms - reports cycling between depression, anxiety, and irritability. Reports worry, rumination, and over thinking. Denies panic attacks. Feels overwhelmed at times. Reports racing thoughts. Reports hypomania. Mood is up and down. Stating "I feel like I'm doing worse - it's relative". Feels like medications are helpful. Family supportive. Varying interest and motivation. Taking medications as prescribed.  Energy levels improved. Active, exercising. Enjoys some usual interests and activities. Married. Lives with husband and daughter. Talking to family and friends.  Appetite adequate - nauseated. Weight loss - 178 pounds. Sleep has improved. Averages 8 hours - taking Trazadone. Reports some daytime napping. Focus and concentration difficulties. Completing tasks. Managing some aspects of household. Retired.  Denies SI or HI.  Denies AH or VH. Denies self harm. Denies substance use.  Previous medications: Celexa, Zyprexa, Tegretol, Depakote, Serzone, Topamax, Seroquel, Effexor, Lexapro, Desipramine, Neurontin, Abilify, Geodon, Propanolol, Cymbalta, Cogentin, Trihexyphenadyl, Sinmmet, Provigil, Selegiline, Requip, Amantadine, Prozac, Mirapex, Azilect, Metoclopramide, Baclofen, Artane, Namenda, Latuda, Lithium,    GAD-7    Flowsheet Row Office Visit from 05/06/2022 in [facility] from 04/25/2022 in [facility] Visit from 04/10/2022 in [facility] Visit from 02/16/2021 in [facility] from 05/14/2019 in [facility]   Total GAD-7 Score 4 9 17 13 21       Mini-Mental    Flowsheet Row Office Visit from 06/07/2019 in [facility] Neurology  Total Score (max 30 points ) 29      PHQ2-9    Flowsheet Row Office Visit from 07/15/2022 in [facility] from 05/06/2022 in [facility] from 04/25/2022 in [facility] Clinical Support from 04/15/2022 in [facility] from 04/10/2022 in [facility]  PHQ-2 Total Score 3 0 0 0 1  PHQ-9 Total Score 15 0 6 0 3      Flowsheet Row Clinical Support from 04/15/2022 in [facility] ED from 04/03/2022 in [facility] Emergency Department at [facility] ED from 11/26/2021 in [facility] Emergency Department at [facility]  C-SSRS RISK CATEGORY No Risk No Risk No Risk        Review of Systems:  Review of Systems  Musculoskeletal:  Negative for gait problem.  Neurological:  Negative for tremors.  Psychiatric/Behavioral:         Please refer to HPI    Medications: I have reviewed the patient's current medications.  Current Outpatient Medications  Medication Sig Dispense Refill   Acetylcysteine (N-ACETYL CYSTEINE) 600 MG CAPS 1 capsule Orally Twice a day     amLODipine (NORVASC) 5 MG tablet Take 1 tablet (5 mg total) by mouth daily. 90 tablet 1   atorvastatin (LIPITOR) 10 MG tablet TAKE 1 TABLET(10 MG) BY MOUTH DAILY 90 tablet 1   augmented betamethasone dipropionate (DIPROLENE-AF) 0.05 % cream APPLY TO THE AFFECTED AREAS OF LEG TWICE DAILY AS NEEDED FOR RASH. NOT TO FACE, GROIN, UNDERARMS 50 g 0   B Complex Vitamins (VITAMIN B COMPLEX) TABS 1 tablet Orally Once daily  betamethasone dipropionate 0.05 % cream 1 application Externally Twice a day     Cholecalciferol 50 MCG (2000 UT) TABS 1 tablet Orally Once a day for 30 day(s)     Golimumab (SIMPONI ARIA IV) Infusion every 8 weeks      lamoTRIgine (LAMICTAL) 200 MG tablet Take 1 tablet (200 mg total) by mouth at bedtime. 90 tablet 3   levothyroxine (SYNTHROID) 50 MCG tablet Take 1 tablet (50 mcg total) by mouth daily. 30 tablet 6   LORazepam (ATIVAN) 1 MG tablet TAKE 1 TABLET(1 MG) BY MOUTH TWICE DAILY AS NEEDED 60 tablet 2   metroNIDAZOLE (METROGEL) 0.75 % gel Apply to face 1-2 times daily 45 g 1   Omega-3 Fatty Acids (FISH OIL) 1200 MG CAPS Take 1 capsule by mouth in the morning and at bedtime.     Probiotic Product (ALIGN) 4 MG CAPS Take 1 capsule by mouth daily in the afternoon.     sodium bicarbonate 650 MG tablet Take 650 mg by mouth 2 (two) times daily.     traZODone (DESYREL) 50 MG tablet Take 50 mg by mouth at bedtime as needed for sleep.     No current facility-administered medications for this visit.    Medication Side Effects: None  Allergies:  Allergies  Allergen Reactions   Aripiprazole Other (See Comments)    Parkinsonism      Lactose Intolerance (Gi) Diarrhea   Methotrexate Other (See Comments)    Hair loss, severe stomatitis     Cefdinir Diarrhea    Other reaction(s): Diarrhea Yeast infection and fever; negative c diff   Etanercept Other (See Comments)    Headaches     Exemestane Other (See Comments)    Suicidal thoughts with medication     Fluoxetine Other (See Comments)    Parkinsonism   Methylprednisolone Sodium Succ Other (See Comments)    Agitated mania   Epinephrine Palpitations    tachycardia    Nitrofurantoin Other (See Comments)     GI upset from 2012    Past Medical History:  Diagnosis Date   Bipolar 1 disorder (HCC)    Chronic diarrhea    loose stools twice a day on average for years   CKD stage G4/A1, 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 1.4 cm  Grade I  invasive lobular, ER and PR +/Her--, Ki 67 <5% Tried anastrozole for one month, but developed suicidal idea   Osteoarthritis, knee 09/24/2019   Xray 09/2019   Parkinsonism    Drug induced   Polyposis of colon    Psoriatic arthritis (HCC)    PTSD (post-traumatic stress disorder)    Secondary hyperparathyroidism (HCC)    Tardive dyskinesia    Thyroid disease     Past Medical History, Surgical history, Social history, and Family history were reviewed and updated as appropriate.   Please see review of systems for further details on the patient's review from today.   Objective:   Physical Exam:  There were no vitals taken for this visit.  Physical Exam Constitutional:      General: She is not in acute distress. Musculoskeletal:        General: No deformity.  Neurological:     Mental Status: She is alert and oriented to person, place, and time.  Coordination: Coordination normal.  Psychiatric:        Attention and Perception: Attention and perception normal. She does not perceive auditory or visual hallucinations.        Mood and Affect: Mood normal. Mood is not anxious or depressed. Affect is not labile, blunt, angry or inappropriate.        Speech: Speech normal.        Behavior: Behavior normal.        Thought Content: Thought content normal. Thought content is not paranoid or delusional. Thought content does not include homicidal or suicidal ideation. Thought content does not include homicidal or suicidal plan.        Cognition and Memory: Cognition and memory normal.        Judgment: Judgment normal.     Comments: Insight intact     Lab Review:     Component Value Date/Time   NA 141 07/12/2022 1108   NA 142 10/17/2021 0000   K 4.3 07/12/2022 1108   CL 105 07/12/2022 1108   CO2 26 07/12/2022 1108   GLUCOSE 69 (L) 07/12/2022 1108   BUN 32 (H) 07/12/2022 1108   BUN 30 (A) 10/17/2021 0000   CREATININE 2.27 (H) 07/12/2022 1108   CREATININE 2.29 (H) 05/22/2021 1044    CALCIUM 8.9 07/19/2022 1100   PROT 7.1 07/15/2022 1515   ALBUMIN 4.4 07/15/2022 1515   AST 25 07/15/2022 1515   ALT 29 07/15/2022 1515   ALKPHOS 138 (H) 07/15/2022 1515   BILITOT 0.9 07/15/2022 1515   GFRNONAA 22 (L) 04/03/2022 1425   GFRAA 25 (L) 09/13/2019 1243       Component Value Date/Time   WBC 7.7 07/12/2022 1108   RBC 4.18 07/12/2022 1108   HGB 12.9 07/12/2022 1108   HCT 38.6 07/12/2022 1108   PLT 201.0 07/12/2022 1108   MCV 92.3 07/12/2022 1108   MCH 30.5 04/03/2022 1425   MCHC 33.4 07/12/2022 1108   RDW 13.5 07/12/2022 1108   LYMPHSABS 3.4 07/12/2022 1108   MONOABS 0.9 07/12/2022 1108   EOSABS 0.3 07/12/2022 1108   BASOSABS 0.1 07/12/2022 1108    Lithium Lvl  Date Value Ref Range Status  05/22/2021 0.7 0.6 - 1.2 mmol/L Final     No results found for: "PHENYTOIN", "PHENOBARB", "VALPROATE", "CBMZ"   .res Assessment: Plan:    Plan:  Lorazepam 1mg  BID for anxiety - may take one tablet extra for severe anxiety symptoms. Taking one at night routinely.  Lamictal 200mg  hs.  Using Trazadone 50mg  as needed  NAC tablets BID - for obsessive thoughts, worry, and rumination - unable to take SSRI's  RTC weekly  Counseled patient regarding potential benefits, risks, and side effects of Lamictal to include potential risk of Stevens-Johnson syndrome. Advised patient to stop taking Lamictal and contact office immediately if rash develops and to seek urgent medical attention if rash is severe and/or spreading quickly.  There are no diagnoses linked to this encounter.   Please see After Visit Summary for patient specific instructions.  Future Appointments  Date Time Provider Department Center  09/06/2022 11:45 AM LB ENDO/NEURO LAB [facility] None  09/11/2022  1:20 PM Pualani Borah, Thereasa Solo, NP CP-CP None  09/18/2022 11:40 AM Giuliano Preece, Thereasa Solo, NP CP-CP None  09/25/2022 11:20 AM Sreeja Spies, Thereasa Solo, NP CP-CP None  10/03/2022 12:00 PM Raedyn Wenke, Thereasa Solo, NP CP-CP None  10/09/2022 12:00 PM Casmere Hollenbeck, Thereasa Solo, NP CP-CP None  10/29/2022  1:20 PM Pricilla Riffle, MD [facility]  11/12/2022 11:50 AM Shamleffer, Konrad Dolores, MD [facility] None  11/14/2022 11:30 AM Van Clines, MD [facility] None  04/24/2023  2:00 PM [facility] ANNUAL WELLNESS VISIT 1 [facility] PEC    No orders of the defined types were placed in this encounter.   -------------------------------

## 2022-09-04 ENCOUNTER — Ambulatory Visit: Payer: Medicare Other | Admitting: Adult Health

## 2022-09-06 ENCOUNTER — Other Ambulatory Visit (INDEPENDENT_AMBULATORY_CARE_PROVIDER_SITE_OTHER): Payer: Medicare Other

## 2022-09-06 ENCOUNTER — Ambulatory Visit: Payer: Medicare Other | Admitting: Internal Medicine

## 2022-09-06 DIAGNOSIS — E039 Hypothyroidism, unspecified: Secondary | ICD-10-CM | POA: Diagnosis not present

## 2022-09-06 LAB — TSH: TSH: 4.05 u[IU]/mL (ref 0.35–5.50)

## 2022-09-06 LAB — T4, FREE: Free T4: 0.81 ng/dL (ref 0.60–1.60)

## 2022-09-11 ENCOUNTER — Ambulatory Visit (INDEPENDENT_AMBULATORY_CARE_PROVIDER_SITE_OTHER): Payer: Medicare Other | Admitting: Adult Health

## 2022-09-11 ENCOUNTER — Encounter: Payer: Self-pay | Admitting: Adult Health

## 2022-09-11 DIAGNOSIS — F411 Generalized anxiety disorder: Secondary | ICD-10-CM | POA: Diagnosis not present

## 2022-09-11 DIAGNOSIS — F99 Mental disorder, not otherwise specified: Secondary | ICD-10-CM

## 2022-09-11 DIAGNOSIS — F4310 Post-traumatic stress disorder, unspecified: Secondary | ICD-10-CM

## 2022-09-11 DIAGNOSIS — F5105 Insomnia due to other mental disorder: Secondary | ICD-10-CM

## 2022-09-11 DIAGNOSIS — F319 Bipolar disorder, unspecified: Secondary | ICD-10-CM

## 2022-09-11 NOTE — Progress Notes (Signed)
Tiffany [name] [id number] [birth year] 72 y.o.  Subjective:   Patient ID:  Tiffany [name] is a 72 y.o. (DOB 1950-05-15) female.  Chief Complaint: No chief complaint on file.   HPI Tiffany [name] presents to the office today for follow-up of PTSD, insomnia, GAD and  BPD 1.  Accompanied by husband.  Describes mood today as "about the same". Pleasant. Reports tearfulness. Mood symptoms - reports cycling between depression, anxiety, and irritability. Denies panic attacks. Reports worry, rumination, and over thinking. Reports fear of abandonment. Reports catastrophic thinking. Feels overwhelmed at times. Reports racing thoughts. Mood is up and down. Stating "I feel like her my motor is running to fast". Feels like medications are helpful. Family supportive. Varying interest and motivation. Taking medications as prescribed.  Energy levels improved. Active, exercising. Enjoys some usual interests and activities. Married. Lives with husband and daughter. Talking to family and friends.  Appetite adequate - nauseated. Weight loss - 178 pounds. Sleeps well most nights. Averages 8 hours. Reports some daytime napping. Focus and concentration difficulties. Completing tasks. Managing some aspects of household. Retired.  Denies SI or HI.  Denies AH or VH. Denies self harm. Denies substance use.  Previous medications: Celexa, Zyprexa, Tegretol, Depakote, Serzone, Topamax, Seroquel, Effexor, Lexapro, Desipramine, Neurontin, Abilify, Geodon, Propanolol, Cymbalta, Cogentin, Trihexyphenadyl, Sinmmet, Provigil, Selegiline, Requip, Amantadine, Prozac, Mirapex, Azilect, Metoclopramide, Baclofen, Artane, Namenda, Latuda, Lithium,    GAD-7    Flowsheet Row Office Visit from 05/06/2022 in [facility] from 04/25/2022 in [facility] Visit from 04/10/2022 in [facility] Visit from 02/16/2021 in [facility] [employer] from 05/14/2019 in [facility]  Total GAD-7 Score 4 9 17 13 21       Mini-Mental    Flowsheet Row Office Visit from 06/07/2019 in [facility]  Total Score (max 30 points ) 29      PHQ2-9    Flowsheet Row Office Visit from 07/15/2022 in [facility] from 05/06/2022 in [facility] from 04/25/2022 in [facility] Clinical Support from 04/15/2022 in [place] [facility] from 04/10/2022 in [facility]  PHQ-2 Total Score 3 0 0 0 1  PHQ-9 Total Score 15 0 6 0 3      Flowsheet Row Clinical Support from 04/15/2022 in [facility] ED from 04/03/2022 in [facility] Emergency Department at [facility] ED from 11/26/2021 in [facility] Emergency Department at [facility]  C-SSRS RISK CATEGORY No Risk No Risk No Risk        Review of Systems:  Review of Systems  Musculoskeletal:  Negative for gait problem.  Neurological:  Negative for tremors.  Psychiatric/Behavioral:         Please refer to HPI    Medications: I have reviewed the patient's current medications.  Current Outpatient Medications  Medication Sig Dispense Refill   Acetylcysteine (N-ACETYL CYSTEINE) 600 MG CAPS 1 capsule Orally Twice a day     amLODipine (NORVASC) 5 MG tablet Take 1 tablet (5 mg total) by mouth daily. 90 tablet 1   atorvastatin (LIPITOR) 10 MG tablet TAKE 1 TABLET(10 MG) BY MOUTH DAILY 90 tablet 1   augmented betamethasone dipropionate (DIPROLENE-AF) 0.05 % cream APPLY TO THE AFFECTED AREAS OF LEG TWICE DAILY AS NEEDED FOR RASH. NOT TO FACE, GROIN, UNDERARMS 50 g 0   B Complex Vitamins (  VITAMIN B COMPLEX) TABS 1 tablet Orally Once daily     betamethasone dipropionate 0.05 % cream 1 application Externally Twice a day     Cholecalciferol 50 MCG (2000 UT) TABS 1 tablet Orally Once a day for  30 day(s)     Golimumab (SIMPONI ARIA IV) Infusion every 8 weeks     lamoTRIgine (LAMICTAL) 200 MG tablet Take 1 tablet (200 mg total) by mouth at bedtime. 90 tablet 3   levothyroxine (SYNTHROID) 50 MCG tablet Take 1 tablet (50 mcg total) by mouth daily. 30 tablet 6   LORazepam (ATIVAN) 1 MG tablet TAKE 1 TABLET(1 MG) BY MOUTH TWICE DAILY AS NEEDED 60 tablet 2   metroNIDAZOLE (METROGEL) 0.75 % gel Apply to face 1-2 times daily 45 g 1   Omega-3 Fatty Acids (FISH OIL) 1200 MG CAPS Take 1 capsule by mouth in the morning and at bedtime.     Probiotic Product (ALIGN) 4 MG CAPS Take 1 capsule by mouth daily in the afternoon.     sodium bicarbonate 650 MG tablet Take 650 mg by mouth 2 (two) times daily.     traZODone (DESYREL) 50 MG tablet Take 50 mg by mouth at bedtime as needed for sleep.     No current facility-administered medications for this visit.    Medication Side Effects: None  Allergies:  Allergies  Allergen Reactions   Aripiprazole Other (See Comments)    Parkinsonism      Lactose Intolerance (Gi) Diarrhea   Methotrexate Other (See Comments)    Hair loss, severe stomatitis     Cefdinir Diarrhea    Other reaction(s): Diarrhea Yeast infection and fever; negative c diff   Etanercept Other (See Comments)    Headaches     Exemestane Other (See Comments)    Suicidal thoughts with medication     Fluoxetine Other (See Comments)    Parkinsonism   Methylprednisolone Sodium Succ Other (See Comments)    Agitated mania   Epinephrine Palpitations    tachycardia    Nitrofurantoin Other (See Comments)     GI upset from 2012    Past Medical History:  Diagnosis Date   Bipolar 1 disorder (HCC)    Chronic diarrhea    loose stools twice a day on average for years   CKD stage G4/A1, 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 1.4 cm  Grade I invasive lobular, ER and PR +/Her--, Ki 67 <5% Tried anastrozole for one month, but developed suicidal idea   Osteoarthritis, knee 09/24/2019   Xray 09/2019   Parkinsonism    Drug induced   Polyposis of colon    Psoriatic arthritis (HCC)    PTSD (post-traumatic stress disorder)    Secondary hyperparathyroidism (HCC)    Tardive dyskinesia    Thyroid disease     Past Medical History, Surgical history, Social history, and Family history were reviewed and updated as appropriate.   Please see review of systems for further details on the patient's review from today.   Objective:   Physical Exam:  There were no vitals taken for this visit.  Physical Exam Constitutional:      General: She is not in acute distress. Musculoskeletal:        General: No deformity.  Neurological:  Mental Status: She is alert and oriented to person, place, and time.     Coordination: Coordination normal.  Psychiatric:        Attention and Perception: Attention and perception normal. She does not perceive auditory or visual hallucinations.        Mood and Affect: Affect is not labile, blunt, angry or inappropriate.        Speech: Speech normal.        Behavior: Behavior normal.        Thought Content: Thought content normal. Thought content is not paranoid or delusional. Thought content does not include homicidal or suicidal ideation. Thought content does not include homicidal or suicidal plan.        Cognition and Memory: Cognition and memory normal.        Judgment: Judgment normal.     Comments: Insight intact     Lab Review:     Component Value Date/Time   NA 141 07/12/2022 1108   NA 142 10/17/2021 0000   K 4.3 07/12/2022 1108   CL 105 07/12/2022 1108   CO2 26 07/12/2022 1108   GLUCOSE 69 (L) 07/12/2022 1108   BUN 32 (H) 07/12/2022 1108   BUN 30 (A) 10/17/2021 0000   CREATININE 2.27 (H) 07/12/2022 1108   CREATININE 2.29  (H) 05/22/2021 1044   CALCIUM 8.9 07/19/2022 1100   PROT 7.1 07/15/2022 1515   ALBUMIN 4.4 07/15/2022 1515   AST 25 07/15/2022 1515   ALT 29 07/15/2022 1515   ALKPHOS 138 (H) 07/15/2022 1515   BILITOT 0.9 07/15/2022 1515   GFRNONAA 22 (L) 04/03/2022 1425   GFRAA 25 (L) 09/13/2019 1243       Component Value Date/Time   WBC 7.7 07/12/2022 1108   RBC 4.18 07/12/2022 1108   HGB 12.9 07/12/2022 1108   HCT 38.6 07/12/2022 1108   PLT 201.0 07/12/2022 1108   MCV 92.3 07/12/2022 1108   MCH 30.5 04/03/2022 1425   MCHC 33.4 07/12/2022 1108   RDW 13.5 07/12/2022 1108   LYMPHSABS 3.4 07/12/2022 1108   MONOABS 0.9 07/12/2022 1108   EOSABS 0.3 07/12/2022 1108   BASOSABS 0.1 07/12/2022 1108    Lithium Lvl  Date Value Ref Range Status  05/22/2021 0.7 0.6 - 1.2 mmol/L Final     No results found for: "PHENYTOIN", "PHENOBARB", "VALPROATE", "CBMZ"   .res Assessment: Plan:    Plan:  Lorazepam 1mg  BID for anxiety - may take one tablet extra for severe anxiety symptoms. Taking one at night routinely.  Lamictal 200mg  hs.  Trazadone 50mg  as needed  NAC tablets BID - for obsessive thoughts, worry, and rumination - unable to take SSRI's  RTC weekly  Counseled patient regarding potential benefits, risks, and side effects of Lamictal to include potential risk of Stevens-Johnson syndrome. Advised patient to stop taking Lamictal and contact office immediately if rash develops and to seek urgent medical attention if rash is severe and/or spreading quickly.  Diagnoses and all orders for this visit:  Bipolar I disorder (HCC)  Generalized anxiety disorder  PTSD (post-traumatic stress disorder)  Insomnia due to other mental disorder     Please see After Visit Summary for patient specific instructions.  Future Appointments  Date Time Provider Department Center  09/18/2022 11:40 AM Manolito Jurewicz, Thereasa Solo, NP [facility] None  09/25/2022 11:20 AM Navaeh Kehres, Thereasa Solo, NP [facility] None   10/03/2022 12:00 PM Brenae Lasecki, Thereasa Solo, NP [facility] None  10/09/2022 12:00 PM Kullen Tomasetti, Thereasa Solo, NP [facility] None  10/29/2022  1:20 PM Pricilla Riffle, MD [facility]  11/12/2022 11:50 AM Shamleffer, Konrad Dolores, MD [facility] None  11/14/2022 11:30 AM Van Clines, MD [facility] None  04/24/2023  2:00 PM [facility] ANNUAL WELLNESS VISIT 1 [facility]    No orders of the defined types were placed in this encounter.   -------------------------------

## 2022-09-18 ENCOUNTER — Encounter: Payer: Self-pay | Admitting: Adult Health

## 2022-09-18 ENCOUNTER — Ambulatory Visit (INDEPENDENT_AMBULATORY_CARE_PROVIDER_SITE_OTHER): Payer: Medicare Other | Admitting: Adult Health

## 2022-09-18 DIAGNOSIS — F411 Generalized anxiety disorder: Secondary | ICD-10-CM

## 2022-09-18 DIAGNOSIS — F99 Mental disorder, not otherwise specified: Secondary | ICD-10-CM

## 2022-09-18 DIAGNOSIS — F4310 Post-traumatic stress disorder, unspecified: Secondary | ICD-10-CM | POA: Diagnosis not present

## 2022-09-18 DIAGNOSIS — F319 Bipolar disorder, unspecified: Secondary | ICD-10-CM | POA: Diagnosis not present

## 2022-09-18 DIAGNOSIS — F5105 Insomnia due to other mental disorder: Secondary | ICD-10-CM | POA: Diagnosis not present

## 2022-09-18 NOTE — Progress Notes (Signed)
Tiffany [name] [id number] 02-24-1951 72 y.o.  Subjective:   Patient ID:  Tiffany [name] is a 72 y.o. (DOB 05/31/1950) female.  Chief Complaint: No chief complaint on file.   HPI Tiffany [name] presents to the office today for follow-up of PTSD, insomnia, GAD and  BPD 1.  Accompanied by husband.  Describes mood today as "variable". Pleasant. Reports tearfulness. Mood symptoms - reports cycling between depression, anxiety, and irritability. Denies panic attacks. Denies worry. Reports rumination, and over thinking. Reports fear of abandonment. Reports catastrophic thinking. Feels overwhelmed at times. Reports racing thoughts. Mood is up and down. Stating "I feel like every day is different". Feels like medications are helpful. Family supportive. Varying interest and motivation. Taking medications as prescribed.  Energy levels improved. Active, exercising. Enjoys some usual interests and activities. Married. Lives with husband and daughter. Talking to family and friends.  Appetite adequate. Weight loss - 178 pounds. Sleeps well most nights. Averages 6 to 7 hours. Denies daytime napping. Focus and concentration difficulties. Completing tasks. Managing some aspects of household. Retired.  Denies SI or HI.  Denies AH or VH. Denies self harm. Denies substance use.  Previous medications: Celexa, Zyprexa, Tegretol, Depakote, Serzone, Topamax, Seroquel, Effexor, Lexapro, Desipramine, Neurontin, Abilify, Geodon, Propanolol, Cymbalta, Cogentin, Trihexyphenadyl, Sinmmet, Provigil, Selegiline, Requip, Amantadine, Prozac, Mirapex, Azilect, Metoclopramide, Baclofen, Artane, Namenda, Latuda, Lithium,    GAD-7    Flowsheet Row Office Visit from 05/06/2022 in [facility] from 04/25/2022 in [facility] Visit from 04/10/2022 in [facility] Visit from 02/16/2021 in [facility] from  05/14/2019 in [facility]  Total GAD-7 Score 4 9 17 13 21       Mini-Mental    Flowsheet Row Office Visit from 06/07/2019 in [facility] Neurology  Total Score (max 30 points ) 29      PHQ2-9    Flowsheet Row Office Visit from 07/15/2022 in [facility] [facility] from 05/06/2022 in [facility] [facility] from 04/25/2022 in [facility] Clinical Support from 04/15/2022 in [place] [facility] from 04/10/2022 in [facility]  PHQ-2 Total Score 3 0 0 0 1  PHQ-9 Total Score 15 0 6 0 3      Flowsheet Row Clinical Support from 04/15/2022 in [facility] ED from 04/03/2022 in [facility] Emergency Department at [facility] ED from 11/26/2021 in [facility] Emergency Department at [facility]  C-SSRS RISK CATEGORY No Risk No Risk No Risk        Review of Systems:  Review of Systems  Musculoskeletal:  Negative for gait problem.  Neurological:  Negative for tremors.  Psychiatric/Behavioral:         Please refer to HPI    Medications: I have reviewed the patient's current medications.  Current Outpatient Medications  Medication Sig Dispense Refill   Acetylcysteine (N-ACETYL CYSTEINE) 600 MG CAPS 1 capsule Orally Twice a day     amLODipine (NORVASC) 5 MG tablet Take 1 tablet (5 mg total) by mouth daily. 90 tablet 1   atorvastatin (LIPITOR) 10 MG tablet TAKE 1 TABLET(10 MG) BY MOUTH DAILY 90 tablet 1   augmented betamethasone dipropionate (DIPROLENE-AF) 0.05 % cream APPLY TO THE AFFECTED AREAS OF LEG TWICE DAILY AS NEEDED FOR RASH. NOT TO FACE, GROIN, UNDERARMS 50 g 0   B Complex Vitamins (VITAMIN B COMPLEX) TABS 1  tablet Orally Once daily     betamethasone dipropionate 0.05 % cream 1 application Externally Twice a day     Cholecalciferol 50 MCG (2000 UT) TABS 1 tablet Orally Once a day for 30 day(s)      Golimumab (SIMPONI ARIA IV) Infusion every 8 weeks     lamoTRIgine (LAMICTAL) 200 MG tablet Take 1 tablet (200 mg total) by mouth at bedtime. 90 tablet 3   levothyroxine (SYNTHROID) 50 MCG tablet Take 1 tablet (50 mcg total) by mouth daily. 30 tablet 6   LORazepam (ATIVAN) 1 MG tablet TAKE 1 TABLET(1 MG) BY MOUTH TWICE DAILY AS NEEDED 60 tablet 2   metroNIDAZOLE (METROGEL) 0.75 % gel Apply to face 1-2 times daily 45 g 1   Omega-3 Fatty Acids (FISH OIL) 1200 MG CAPS Take 1 capsule by mouth in the morning and at bedtime.     Probiotic Product (ALIGN) 4 MG CAPS Take 1 capsule by mouth daily in the afternoon.     sodium bicarbonate 650 MG tablet Take 650 mg by mouth 2 (two) times daily.     traZODone (DESYREL) 50 MG tablet Take 50 mg by mouth at bedtime as needed for sleep.     No current facility-administered medications for this visit.    Medication Side Effects: None  Allergies:  Allergies  Allergen Reactions   Aripiprazole Other (See Comments)    Parkinsonism      Lactose Intolerance (Gi) Diarrhea   Methotrexate Other (See Comments)    Hair loss, severe stomatitis     Cefdinir Diarrhea    Other reaction(s): Diarrhea Yeast infection and fever; negative c diff   Etanercept Other (See Comments)    Headaches     Exemestane Other (See Comments)    Suicidal thoughts with medication     Fluoxetine Other (See Comments)    Parkinsonism   Methylprednisolone Sodium Succ Other (See Comments)    Agitated mania   Epinephrine Palpitations    tachycardia    Nitrofurantoin Other (See Comments)     GI upset from 2012    Past Medical History:  Diagnosis Date   Bipolar 1 disorder (HCC)    Chronic diarrhea    loose stools twice a day on average for years   CKD stage G4/A1, 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 1.4 cm  Grade I invasive lobular, ER and PR +/Her--, Ki 67 <5% Tried anastrozole for one month, but developed suicidal idea   Osteoarthritis, knee 09/24/2019   Xray 09/2019   Parkinsonism    Drug induced   Polyposis of colon    Psoriatic arthritis (HCC)    PTSD (post-traumatic stress disorder)    Secondary hyperparathyroidism (HCC)    Tardive dyskinesia    Thyroid disease     Past Medical History, Surgical history, Social history, and Family history were reviewed and updated as appropriate.   Please see review of systems for further details on the patient's review from today.   Objective:   Physical Exam:  There were no vitals taken for this visit.  Physical Exam Constitutional:      General: She is not in acute distress. Musculoskeletal:        General: No deformity.  Neurological:     Mental Status: She is alert  and oriented to person, place, and time.     Coordination: Coordination normal.  Psychiatric:        Attention and Perception: Attention and perception normal. She does not perceive auditory or visual hallucinations.        Mood and Affect: Affect is not labile, blunt, angry or inappropriate.        Speech: Speech normal.        Behavior: Behavior normal.        Thought Content: Thought content normal. Thought content is not paranoid or delusional. Thought content does not include homicidal or suicidal ideation. Thought content does not include homicidal or suicidal plan.        Cognition and Memory: Cognition and memory normal.        Judgment: Judgment normal.     Comments: Insight intact     Lab Review:     Component Value Date/Time   NA 141 07/12/2022 1108   NA 142 10/17/2021 0000   K 4.3 07/12/2022 1108   CL 105 07/12/2022 1108   CO2 26 07/12/2022 1108   GLUCOSE 69 (L) 07/12/2022 1108   BUN 32 (H) 07/12/2022 1108   BUN 30 (A) 10/17/2021 0000   CREATININE 2.27 (H) 07/12/2022 1108   CREATININE 2.29 (H)  05/22/2021 1044   CALCIUM 8.9 07/19/2022 1100   PROT 7.1 07/15/2022 1515   ALBUMIN 4.4 07/15/2022 1515   AST 25 07/15/2022 1515   ALT 29 07/15/2022 1515   ALKPHOS 138 (H) 07/15/2022 1515   BILITOT 0.9 07/15/2022 1515   GFRNONAA 22 (L) 04/03/2022 1425   GFRAA 25 (L) 09/13/2019 1243       Component Value Date/Time   WBC 7.7 07/12/2022 1108   RBC 4.18 07/12/2022 1108   HGB 12.9 07/12/2022 1108   HCT 38.6 07/12/2022 1108   PLT 201.0 07/12/2022 1108   MCV 92.3 07/12/2022 1108   MCH 30.5 04/03/2022 1425   MCHC 33.4 07/12/2022 1108   RDW 13.5 07/12/2022 1108   LYMPHSABS 3.4 07/12/2022 1108   MONOABS 0.9 07/12/2022 1108   EOSABS 0.3 07/12/2022 1108   BASOSABS 0.1 07/12/2022 1108    Lithium Lvl  Date Value Ref Range Status  05/22/2021 0.7 0.6 - 1.2 mmol/L Final     No results found for: "PHENYTOIN", "PHENOBARB", "VALPROATE", "CBMZ"   .res Assessment: Plan:    Plan:  Lorazepam 1mg  BID for anxiety - may take one tablet extra for severe anxiety symptoms. Taking one at night routinely.  Lamictal 200mg  hs.  Trazadone 50mg  as needed  NAC tablets BID - for obsessive thoughts, worry, and rumination - unable to take SSRI's  RTC weekly  Counseled patient regarding potential benefits, risks, and side effects of Lamictal to include potential risk of Stevens-Johnson syndrome. Advised patient to stop taking Lamictal and contact office immediately if rash develops and to seek urgent medical attention if rash is severe and/or spreading quickly.  There are no diagnoses linked to this encounter.   Please see After Visit Summary for patient specific instructions.  Future Appointments  Date Time Provider Department Center  09/25/2022 11:20 AM Morgen Linebaugh, Thereasa Solo, NP CP-[facility] None  10/03/2022 12:00 PM Yaritzel Stange, Thereasa Solo, NP CP[facility] None  10/09/2022 12:00 PM Hallelujah Wysong, Thereasa Solo, NP CP-CP None  10/29/2022  1:20 PM Pricilla Riffle, MD [facility]  11/12/2022 11:50 AM  Shamleffer, Konrad Dolores, MD [facility] None  11/14/2022 11:30 AM Van Clines, MD [facility] None  04/24/2023  2:00 PM [facility] ANNUAL Joan Flores  VISIT 1 [facility] PEC    No orders of the defined types were placed in this encounter.   -------------------------------

## 2022-09-25 ENCOUNTER — Ambulatory Visit: Payer: Medicare Other | Admitting: Adult Health

## 2022-09-27 ENCOUNTER — Encounter: Payer: Self-pay | Admitting: Adult Health

## 2022-09-27 ENCOUNTER — Ambulatory Visit (INDEPENDENT_AMBULATORY_CARE_PROVIDER_SITE_OTHER): Payer: Medicare Other | Admitting: Adult Health

## 2022-09-27 DIAGNOSIS — F319 Bipolar disorder, unspecified: Secondary | ICD-10-CM | POA: Diagnosis not present

## 2022-09-27 DIAGNOSIS — F411 Generalized anxiety disorder: Secondary | ICD-10-CM | POA: Diagnosis not present

## 2022-09-27 DIAGNOSIS — F4310 Post-traumatic stress disorder, unspecified: Secondary | ICD-10-CM

## 2022-09-27 DIAGNOSIS — F5105 Insomnia due to other mental disorder: Secondary | ICD-10-CM | POA: Diagnosis not present

## 2022-09-27 DIAGNOSIS — F99 Mental disorder, not otherwise specified: Secondary | ICD-10-CM

## 2022-09-27 NOTE — Progress Notes (Signed)
Tiffany [name] [id number] Dec 18, 1950 72 y.o.  Subjective:   Patient ID:  Tiffany [name] is a 72 y.o. (DOB Aug 06, 1950) female.  Chief Complaint: No chief complaint on file.   HPI Tiffany [name] presents to the office today for follow-up of PTSD, insomnia, GAD and BPD- 1.  Accompanied by husband.  Describes mood today as "fluctuating". Pleasant. Reports tearfulness. Mood symptoms - reports cycling between depression and agitation for approximately three hours a day and then transitions into hypomania. Reports anxiety and irritability. Denies panic attacks. Reports worry, rumination, and over thinking. Feels overwhelmed at times. Reports fear of abandonment. Reports catastrophic thinking. Reports racing thoughts. Mood is variable - up and down. Stating "I feel like there some high notes in this". Reports she is able to recognize limitations and remove herself from them when she can. Feels like medications are helpful. Family supportive. Varying interest and motivation. Taking medications as prescribed.  Energy levels improved. Active, exercising. Enjoys some usual interests and activities. Married. Lives with husband and daughter. Talking to family and friends.  Appetite adequate - reports binge eating. Weight gain - 178 to 183 pounds. Sleeps well most nights. Averages 7 hours. Denies daytime napping. Focus and concentration difficulties. Completing tasks. Managing some aspects of household. Retired.  Denies SI or HI.  Denies AH or VH. Denies self harm. Denies substance use.  Reports arthritic pain.  Previous medications: Celexa, Zyprexa, Tegretol, Depakote, Serzone, Topamax, Seroquel, Effexor, Lexapro, Desipramine, Neurontin, Abilify, Geodon, Propanolol, Cymbalta, Cogentin, Trihexyphenadyl, Sinmmet, Provigil, Selegiline, Requip, Amantadine, Prozac, Mirapex, Azilect, Metoclopramide, Baclofen, Artane, Namenda, Latuda, Lithium,     GAD-7    Flowsheet Row Office Visit from 05/06/2022 in [place]  [facility] from 04/25/2022 in [place] [facility] Visit from 04/10/2022 in [place] [facility] Visit from 02/16/2021 in [place] [facility] from 05/14/2019 in [place] [facility]  Total GAD-7 Score 4 9 17 13 21       Mini-Mental    Flowsheet Row Office Visit from 06/07/2019 in [facility] Neurology  Total Score (max 30 points ) 29      PHQ2-9    Flowsheet Row Office Visit from 07/15/2022 in [place] [facility] from 05/06/2022 in [place] [facility] from 04/25/2022 in [place] [facility] Clinical Support from 04/15/2022 in [place] [facility] from 04/10/2022 in [place] [facility]  PHQ-2 Total Score 3 0 0 0 1  PHQ-9 Total Score 15 0 6 0 3      Flowsheet Row Clinical Support from 04/15/2022 in [place] [facility] ED from 04/03/2022 in [facility] Department at [facility] ED from 11/26/2021 in [facility] Emergency Department at [facility]  C-SSRS RISK CATEGORY No Risk No Risk No Risk        Review of Systems:  Review of Systems  Musculoskeletal:  Negative for gait problem.  Neurological:  Negative for tremors.  Psychiatric/Behavioral:         Please refer to HPI    Medications: I have reviewed the patient's current medications.  Current Outpatient Medications  Medication Sig Dispense Refill   Acetylcysteine (N-ACETYL CYSTEINE) 600 MG CAPS 1 capsule Orally Twice a day     amLODipine (NORVASC) 5 MG tablet Take 1 tablet (5 mg total) by mouth daily. 90 tablet 1   atorvastatin (LIPITOR) 10 MG tablet TAKE 1 TABLET(10 MG) BY MOUTH DAILY 90 tablet 1  augmented betamethasone dipropionate (DIPROLENE-AF) 0.05 % cream APPLY TO THE AFFECTED AREAS OF LEG TWICE DAILY AS NEEDED FOR RASH. NOT TO FACE, GROIN, UNDERARMS 50 g 0    B Complex Vitamins (VITAMIN B COMPLEX) TABS 1 tablet Orally Once daily     betamethasone dipropionate 0.05 % cream 1 application Externally Twice a day     Cholecalciferol 50 MCG (2000 UT) TABS 1 tablet Orally Once a day for 30 day(s)     Golimumab (SIMPONI ARIA IV) Infusion every 8 weeks     lamoTRIgine (LAMICTAL) 200 MG tablet Take 1 tablet (200 mg total) by mouth at bedtime. 90 tablet 3   levothyroxine (SYNTHROID) 50 MCG tablet Take 1 tablet (50 mcg total) by mouth daily. 30 tablet 6   LORazepam (ATIVAN) 1 MG tablet TAKE 1 TABLET(1 MG) BY MOUTH TWICE DAILY AS NEEDED 60 tablet 2   metroNIDAZOLE (METROGEL) 0.75 % gel Apply to face 1-2 times daily 45 g 1   Omega-3 Fatty Acids (FISH OIL) 1200 MG CAPS Take 1 capsule by mouth in the morning and at bedtime.     Probiotic Product (ALIGN) 4 MG CAPS Take 1 capsule by mouth daily in the afternoon.     sodium bicarbonate 650 MG tablet Take 650 mg by mouth 2 (two) times daily.     traZODone (DESYREL) 50 MG tablet Take 50 mg by mouth at bedtime as needed for sleep.     No current facility-administered medications for this visit.    Medication Side Effects: None  Allergies:  Allergies  Allergen Reactions   Aripiprazole Other (See Comments)    Parkinsonism      Lactose Intolerance (Gi) Diarrhea   Methotrexate Other (See Comments)    Hair loss, severe stomatitis     Cefdinir Diarrhea    Other reaction(s): Diarrhea Yeast infection and fever; negative c diff   Etanercept Other (See Comments)    Headaches     Exemestane Other (See Comments)    Suicidal thoughts with medication     Fluoxetine Other (See Comments)    Parkinsonism   Methylprednisolone Sodium Succ Other (See Comments)    Agitated mania   Epinephrine Palpitations    tachycardia    Nitrofurantoin Other (See Comments)     GI upset from 2012    Past Medical History:  Diagnosis Date   Bipolar 1 disorder (HCC)    Chronic diarrhea    loose stools twice a day on average  for years   CKD stage G4/A1, 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 1.4 cm  Grade I invasive lobular, ER and PR +/Her--, Ki 67 <5% Tried anastrozole for one month, but developed suicidal idea   Osteoarthritis, knee 09/24/2019   Xray 09/2019   Parkinsonism    Drug induced   Polyposis of colon    Psoriatic arthritis (HCC)    PTSD (post-traumatic stress disorder)    Secondary hyperparathyroidism (HCC)    Tardive dyskinesia    Thyroid disease     Past Medical History, Surgical history, Social history, and Family history were reviewed and updated as appropriate.   Please see review of systems for further details on the patient's review from today.   Objective:   Physical Exam:  There were no vitals taken for this visit.  Physical Exam Constitutional:      General: She is not in acute distress. Musculoskeletal:        General: No deformity.  Neurological:     Mental Status: She is alert and oriented to person, place, and time.     Coordination: Coordination normal.  Psychiatric:        Attention and Perception: Attention and perception normal. She does not perceive auditory or visual hallucinations.        Mood and Affect: Affect is not labile, blunt, angry or inappropriate.        Speech: Speech normal.        Behavior: Behavior normal.        Thought Content: Thought content normal. Thought content is not paranoid or delusional. Thought content does not include homicidal or suicidal ideation. Thought content does not include homicidal or suicidal plan.        Cognition and Memory: Cognition and memory normal.        Judgment: Judgment normal.     Comments: Insight intact     Lab Review:     Component Value Date/Time   NA 141 07/12/2022 1108   NA 142 10/17/2021 0000    K 4.3 07/12/2022 1108   CL 105 07/12/2022 1108   CO2 26 07/12/2022 1108   GLUCOSE 69 (L) 07/12/2022 1108   BUN 32 (H) 07/12/2022 1108   BUN 30 (A) 10/17/2021 0000   CREATININE 2.27 (H) 07/12/2022 1108   CREATININE 2.29 (H) 05/22/2021 1044   CALCIUM 8.9 07/19/2022 1100   PROT 7.1 07/15/2022 1515   ALBUMIN 4.4 07/15/2022 1515   AST 25 07/15/2022 1515   ALT 29 07/15/2022 1515   ALKPHOS 138 (H) 07/15/2022 1515   BILITOT 0.9 07/15/2022 1515   GFRNONAA 22 (L) 04/03/2022 1425   GFRAA 25 (L) 09/13/2019 1243       Component Value Date/Time   WBC 7.7 07/12/2022 1108   RBC 4.18 07/12/2022 1108   HGB 12.9 07/12/2022 1108   HCT 38.6 07/12/2022 1108   PLT 201.0 07/12/2022 1108   MCV 92.3 07/12/2022 1108   MCH 30.5 04/03/2022 1425   MCHC 33.4 07/12/2022 1108   RDW 13.5 07/12/2022 1108   LYMPHSABS 3.4 07/12/2022 1108   MONOABS 0.9 07/12/2022 1108   EOSABS 0.3 07/12/2022 1108   BASOSABS 0.1 07/12/2022 1108    Lithium Lvl  Date Value Ref Range Status  05/22/2021 0.7 0.6 - 1.2 mmol/L Final     No results found for: "PHENYTOIN", "PHENOBARB", "VALPROATE", "CBMZ"   .res Assessment: Plan:    Plan:  Lorazepam 1mg  BID for anxiety - may take one tablet extra for severe anxiety symptoms. Taking one at night routinely.  Lamictal 200mg  hs.  Trazadone 50mg  as needed  NAC tablets BID - for obsessive thoughts, worry, and rumination - unable to take SSRI's  RTC weekly  Counseled patient regarding potential benefits, risks, and side effects of Lamictal to include potential risk of Stevens-Johnson syndrome. Advised patient to stop taking Lamictal and contact office immediately if rash develops and to seek urgent medical attention if rash is severe and/or spreading quickly.  Diagnoses and all orders for this visit:  Bipolar I disorder (HCC)  Generalized anxiety disorder  PTSD (post-traumatic stress disorder)  Insomnia due to other mental disorder     Please see After Visit Summary  for patient specific instructions.  Future Appointments  Date Time Provider Department Center  10/03/2022 12:00 PM Sura Canul, Thereasa Solo, NP [facility] None  10/09/2022 12:00 PM Yolandra Habig, Thereasa Solo, NP [facility] None  10/29/2022  1:20 PM Pricilla Riffle, MD [facility]  11/12/2022 11:50 AM Shamleffer, Konrad Dolores, MD [facility] None  11/14/2022 11:30 AM Van Clines, MD [facility] None  04/24/2023  2:00 PM [facility] ANNUAL WELLNESS VISIT 1 [facility]    No orders of the defined types were placed in this encounter.   -------------------------------

## 2022-10-03 ENCOUNTER — Encounter: Payer: Self-pay | Admitting: Adult Health

## 2022-10-03 ENCOUNTER — Ambulatory Visit (INDEPENDENT_AMBULATORY_CARE_PROVIDER_SITE_OTHER): Payer: Medicare Other | Admitting: Adult Health

## 2022-10-03 DIAGNOSIS — F411 Generalized anxiety disorder: Secondary | ICD-10-CM | POA: Diagnosis not present

## 2022-10-03 DIAGNOSIS — F5105 Insomnia due to other mental disorder: Secondary | ICD-10-CM | POA: Diagnosis not present

## 2022-10-03 DIAGNOSIS — F4310 Post-traumatic stress disorder, unspecified: Secondary | ICD-10-CM

## 2022-10-03 DIAGNOSIS — F319 Bipolar disorder, unspecified: Secondary | ICD-10-CM

## 2022-10-03 DIAGNOSIS — F99 Mental disorder, not otherwise specified: Secondary | ICD-10-CM

## 2022-10-03 NOTE — Progress Notes (Signed)
Tiffany [name] [id number] 09-13-1950 72 y.o.  Subjective:   Patient ID:  Tiffany [name] is a 72 y.o. (DOB 06/22/1950) female.  Chief Complaint: No chief complaint on file.   HPI [name] presents to the office today for follow-up of PTSD, insomnia, GAD and BPD- 1.  Describes mood today as "not the best". Pleasant. Reports tearfulness. Mood symptoms - reports mood is "cycling and unpredictable". Reports anxiety. Denies panic attacks. Denies anger outbursts. Reports worry, rumination, and over thinking. Feels overwhelmed at times. Reports racing thoughts. Reports catastrophic thinking. Reports racing thoughts. Mood is "teetering" - up and down. Stating "I'm not doing well overall". Feels like medications are helpful. Family supportive. Varying interest and motivation. Taking medications as prescribed.  Energy levels fluctuates. Active, exercising. Enjoys some usual interests and activities. Married. Lives with husband and daughter. Talking to family and friends.  Appetite adequate - eating "normally". Weight loss - 177 from 183 pounds. Sleeps well most nights. Averages 7 hours - up and down during the night. Denies daytime napping. Focus and concentration difficulties. Completing tasks. Managing some aspects of household. Retired.  Denies SI or HI.  Denies AH or VH. Denies self harm. Denies substance use.  Reports arthritic pain - fluctuates.  Previous medications: Celexa, Zyprexa, Tegretol, Depakote, Serzone, Topamax, Seroquel, Effexor, Lexapro, Desipramine, Neurontin, Abilify, Geodon, Propanolol, Cymbalta, Cogentin, Trihexyphenadyl, Sinmmet, Provigil, Selegiline, Requip, Amantadine, Prozac, Mirapex, Azilect, Metoclopramide, Baclofen, Artane, Namenda, Latuda, Lithium,    GAD-7    Flowsheet Row Office Visit from 05/06/2022 in [place] [facility] from 04/25/2022 in [place] [facility] Visit from 04/10/2022 in [place] [facility] Visit from 02/16/2021 in [place] [facility] from 05/14/2019 in [place] [facility]  Total GAD-7 Score 4 9 17 13 21       Mini-Mental    Flowsheet Row Office Visit from 06/07/2019 in [facility] Neurology  Total Score (max 30 points ) 29      PHQ2-9    Flowsheet Row Office Visit from 07/15/2022 in [place] [facility] from 05/06/2022 in [place] [facility] from 04/25/2022 in [place] [facility] Clinical Support from 04/15/2022 in [place] [facility] from 04/10/2022 in [place] [facility]  PHQ-2 Total Score 3 0 0 0 1  PHQ-9 Total Score 15 0 6 0 3      Flowsheet Row Clinical Support from 04/15/2022 in [place] [facility] ED from 04/03/2022 in [facility] Emergency Department at [facility] ED from 11/26/2021 in [facility] Emergency Department at [facility]  C-SSRS RISK CATEGORY No Risk No Risk No Risk        Review of Systems:  Review of Systems  Musculoskeletal:  Negative for gait problem.  Neurological:  Negative for tremors.  Psychiatric/Behavioral:         Please refer to HPI    Medications: I have reviewed the patient's current medications.  Current Outpatient Medications  Medication Sig Dispense Refill   Acetylcysteine (N-ACETYL CYSTEINE) 600 MG CAPS 1 capsule Orally Twice a day     amLODipine (NORVASC) 5 MG tablet Take 1 tablet (5 mg total) by mouth daily. 90 tablet 1   atorvastatin (LIPITOR) 10 MG tablet TAKE 1 TABLET(10 MG) BY MOUTH DAILY 90 tablet 1   augmented betamethasone dipropionate (DIPROLENE-AF) 0.05 % cream APPLY TO THE AFFECTED AREAS OF LEG TWICE DAILY AS NEEDED FOR RASH. NOT TO FACE,  GROIN, UNDERARMS 50 g 0   B Complex Vitamins (VITAMIN B COMPLEX) TABS 1 tablet Orally Once daily     betamethasone dipropionate 0.05 % cream 1 application Externally Twice a day      Cholecalciferol 50 MCG (2000 UT) TABS 1 tablet Orally Once a day for 30 day(s)     Golimumab (SIMPONI ARIA IV) Infusion every 8 weeks     lamoTRIgine (LAMICTAL) 200 MG tablet Take 1 tablet (200 mg total) by mouth at bedtime. 90 tablet 3   levothyroxine (SYNTHROID) 50 MCG tablet Take 1 tablet (50 mcg total) by mouth daily. 30 tablet 6   LORazepam (ATIVAN) 1 MG tablet TAKE 1 TABLET(1 MG) BY MOUTH TWICE DAILY AS NEEDED 60 tablet 2   metroNIDAZOLE (METROGEL) 0.75 % gel Apply to face 1-2 times daily 45 g 1   Omega-3 Fatty Acids (FISH OIL) 1200 MG CAPS Take 1 capsule by mouth in the morning and at bedtime.     Probiotic Product (ALIGN) 4 MG CAPS Take 1 capsule by mouth daily in the afternoon.     sodium bicarbonate 650 MG tablet Take 650 mg by mouth 2 (two) times daily.     traZODone (DESYREL) 50 MG tablet Take 50 mg by mouth at bedtime as needed for sleep.     No current facility-administered medications for this visit.    Medication Side Effects: None  Allergies:  Allergies  Allergen Reactions   Aripiprazole Other (See Comments)    Parkinsonism      Lactose Intolerance (Gi) Diarrhea   Methotrexate Other (See Comments)    Hair loss, severe stomatitis     Cefdinir Diarrhea    Other reaction(s): Diarrhea Yeast infection and fever; negative c diff   Etanercept Other (See Comments)    Headaches     Exemestane Other (See Comments)    Suicidal thoughts with medication     Fluoxetine Other (See Comments)    Parkinsonism   Methylprednisolone Sodium Succ Other (See Comments)    Agitated mania   Epinephrine Palpitations    tachycardia    Nitrofurantoin Other (See Comments)     GI upset from 2012    Past Medical History:  Diagnosis Date   Bipolar 1 disorder (HCC)    Chronic diarrhea    loose stools twice a day on average for years   CKD stage G4/A1, 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 1.4 cm  Grade I invasive lobular, ER and PR +/Her--, Ki 67 <5% Tried anastrozole for one month, but developed suicidal idea   Osteoarthritis, knee 09/24/2019   Xray 09/2019   Parkinsonism    Drug induced   Polyposis of colon    Psoriatic arthritis (HCC)    PTSD (post-traumatic stress disorder)    Secondary hyperparathyroidism (HCC)    Tardive dyskinesia    Thyroid disease     Past Medical History, Surgical history, Social history, and Family history were reviewed and updated as appropriate.   Please see review of systems for further details on the patient's review from today.   Objective:   Physical Exam:  There were no vitals taken for this visit.  Physical Exam Constitutional:      General: She is not in acute distress. Musculoskeletal:  General: No deformity.  Neurological:     Mental Status: She is alert and oriented to person, place, and time.     Coordination: Coordination normal.  Psychiatric:        Attention and Perception: Attention and perception normal. She does not perceive auditory or visual hallucinations.        Mood and Affect: Affect is not labile, blunt, angry or inappropriate.        Speech: Speech normal.        Behavior: Behavior normal.        Thought Content: Thought content normal. Thought content is not paranoid or delusional. Thought content does not include homicidal or suicidal ideation. Thought content does not include homicidal or suicidal plan.        Cognition and Memory: Cognition and memory normal.        Judgment: Judgment normal.     Comments: Insight intact     Lab Review:     Component Value Date/Time   NA 141 07/12/2022 1108   NA 142 10/17/2021 0000   K 4.3 07/12/2022 1108   CL 105 07/12/2022 1108   CO2 26 07/12/2022 1108   GLUCOSE 69 (L) 07/12/2022 1108   BUN 32 (H) 07/12/2022 1108   BUN 30 (A)  10/17/2021 0000   CREATININE 2.27 (H) 07/12/2022 1108   CREATININE 2.29 (H) 05/22/2021 1044   CALCIUM 8.9 07/19/2022 1100   PROT 7.1 07/15/2022 1515   ALBUMIN 4.4 07/15/2022 1515   AST 25 07/15/2022 1515   ALT 29 07/15/2022 1515   ALKPHOS 138 (H) 07/15/2022 1515   BILITOT 0.9 07/15/2022 1515   GFRNONAA 22 (L) 04/03/2022 1425   GFRAA 25 (L) 09/13/2019 1243       Component Value Date/Time   WBC 7.7 07/12/2022 1108   RBC 4.18 07/12/2022 1108   HGB 12.9 07/12/2022 1108   HCT 38.6 07/12/2022 1108   PLT 201.0 07/12/2022 1108   MCV 92.3 07/12/2022 1108   MCH 30.5 04/03/2022 1425   MCHC 33.4 07/12/2022 1108   RDW 13.5 07/12/2022 1108   LYMPHSABS 3.4 07/12/2022 1108   MONOABS 0.9 07/12/2022 1108   EOSABS 0.3 07/12/2022 1108   BASOSABS 0.1 07/12/2022 1108    Lithium Lvl  Date Value Ref Range Status  05/22/2021 0.7 0.6 - 1.2 mmol/L Final     No results found for: "PHENYTOIN", "PHENOBARB", "VALPROATE", "CBMZ"   .res Assessment: Plan:    Plan:  Lorazepam 1mg  BID for anxiety - may take one tablet extra for severe anxiety symptoms. Taking one at night routinely.  Lamictal 200mg  hs.  Trazadone 50mg  as needed  NAC tablets BID - for obsessive thoughts, worry, and rumination - unable to take SSRI's  RTC weekly  Counseled patient regarding potential benefits, risks, and side effects of Lamictal to include potential risk of Stevens-Johnson syndrome. Advised patient to stop taking Lamictal and contact office immediately if rash develops and to seek urgent medical attention if rash is severe and/or spreading quickly.  There are no diagnoses linked to this encounter.   Please see After Visit Summary for patient specific instructions.  Future Appointments  Date Time Provider Department Center  10/03/2022 12:00 PM Robbyn Hodkinson, Thereasa Solo, NP CP-CP None  10/09/2022 12:00 PM Anamaria Dusenbury, Thereasa Solo, NP CP-CP None  10/16/2022  1:40 PM Romesha Scherer, Thereasa Solo, NP CP-CP None   10/23/2022  2:40 PM Kleigh Hoelzer, Thereasa Solo, NP CP-CP None  10/29/2022  1:20 PM Pricilla Riffle, MD [facility]  10/30/2022  1:00 PM Nilesh Stegall, Thereasa Solo, NP CP-CP None  11/06/2022  1:40 PM Vanessa Kampf, Thereasa Solo, NP CP-CP None  11/12/2022 11:50 AM Shamleffer, Konrad Dolores, MD [facility] None  11/14/2022 11:30 AM Van Clines, MD [facility] None  04/24/2023  2:00 PM [facility] ANNUAL WELLNESS VISIT 1 [facility] PEC    No orders of the defined types were placed in this encounter.   -------------------------------

## 2022-10-09 ENCOUNTER — Encounter: Payer: Self-pay | Admitting: Adult Health

## 2022-10-09 ENCOUNTER — Telehealth (INDEPENDENT_AMBULATORY_CARE_PROVIDER_SITE_OTHER): Payer: Medicare Other | Admitting: Adult Health

## 2022-10-09 DIAGNOSIS — F99 Mental disorder, not otherwise specified: Secondary | ICD-10-CM

## 2022-10-09 DIAGNOSIS — F319 Bipolar disorder, unspecified: Secondary | ICD-10-CM

## 2022-10-09 DIAGNOSIS — F411 Generalized anxiety disorder: Secondary | ICD-10-CM | POA: Diagnosis not present

## 2022-10-09 DIAGNOSIS — F4310 Post-traumatic stress disorder, unspecified: Secondary | ICD-10-CM | POA: Diagnosis not present

## 2022-10-09 DIAGNOSIS — F5105 Insomnia due to other mental disorder: Secondary | ICD-10-CM | POA: Diagnosis not present

## 2022-10-09 NOTE — Progress Notes (Signed)
Tiffany [name] [id number] 23-May-1950 72 y.o.  Virtual Visit via Video Note  I connected with pt @ on 10/09/22 at 12:00 PM EDT by a video enabled telemedicine application and verified that I am speaking with the correct person using two identifiers.   I discussed the limitations of evaluation and management by telemedicine and the availability of in person appointments. The patient expressed understanding and agreed to proceed.  I discussed the assessment and treatment plan with the patient. The patient was provided an opportunity to ask questions and all were answered. The patient agreed with the plan and demonstrated an understanding of the instructions.   The patient was advised to call back or seek an in-person evaluation if the symptoms worsen or if the condition fails to improve as anticipated.  I provided 25 minutes of non-face-to-face time during this encounter.  The patient was located at home.  The provider was located at [facility].   [name], NP   Subjective:   Patient ID:  Tiffany [name] is a 72 y.o. (DOB [birth year]) female.  Chief Complaint: No chief complaint on file.   HPI Tiffany [name] presents for follow-up of PTSD, insomnia, GAD and BPD- 1.  Describes mood today as "it varies". Pleasant. Reports tearfulness. Mood symptoms - reports mood is "cycling throughout the day". Reports depression and anxiety. Reports irritability. Denies panic attacks. Denies anger outbursts.  Reports worry, rumination, and over thinking. Feels overwhelmed at times. Reports racing thoughts. Mood is variable. Stating "I think I've done well over the past week". Feels like medications are helpful. Family supportive. Varying interest and motivation.Taking medications as prescribed.  Energy levels fluctuates. Active, exercising. Enjoys some usual interests and activities. Married. Lives with husband and daughter. Talking to family and friends.  Appetite adequate. Weight stable -  177 pounds. Sleeps well most nights. Averages 8 to 9 hours of broken sleep. Denies daytime napping. Focus and concentration "maybe a little bit better". Completing tasks. Managing some aspects of household. Retired.  Denies SI or HI.  Denies AH or VH. Denies self harm. Denies substance use.  Reports arthritic pain - fluctuates.  Previous medications: Celexa, Zyprexa, Tegretol, Depakote, Serzone, Topamax, Seroquel, Effexor, Lexapro, Desipramine, Neurontin, Abilify, Geodon, Propanolol, Cymbalta, Cogentin, Trihexyphenadyl, Sinmmet, Provigil, Selegiline, Requip, Amantadine, Prozac, Mirapex, Azilect, Metoclopramide, Baclofen, Artane, Namenda, Latuda, Lithium,    Review of Systems:  Review of Systems  Musculoskeletal:  Negative for gait problem.  Neurological:  Negative for tremors.  Psychiatric/Behavioral:         Please refer to HPI    Medications: I have reviewed the patient's current medications.  Current Outpatient Medications  Medication Sig Dispense Refill   Acetylcysteine (N-ACETYL CYSTEINE) 600 MG CAPS 1 capsule Orally Twice a day     amLODipine (NORVASC) 5 MG tablet Take 1 tablet (5 mg total) by mouth daily. 90 tablet 1   atorvastatin (LIPITOR) 10 MG tablet TAKE 1 TABLET(10 MG) BY MOUTH DAILY 90 tablet 1   augmented betamethasone dipropionate (DIPROLENE-AF) 0.05 % cream APPLY TO THE AFFECTED AREAS OF LEG TWICE DAILY AS NEEDED FOR RASH. NOT TO FACE, GROIN, UNDERARMS 50 g 0   B Complex Vitamins (VITAMIN B COMPLEX) TABS 1 tablet Orally Once daily     betamethasone dipropionate 0.05 % cream 1 application Externally Twice a day     Cholecalciferol 50 MCG (2000 UT) TABS 1 tablet Orally Once a day for 30 day(s)     Golimumab (SIMPONI ARIA IV) Infusion every 8 weeks  lamoTRIgine (LAMICTAL) 200 MG tablet Take 1 tablet (200 mg total) by mouth at bedtime. 90 tablet 3   levothyroxine (SYNTHROID) 50 MCG tablet Take 1 tablet (50 mcg total) by mouth daily. 30 tablet 6   LORazepam (ATIVAN)  1 MG tablet TAKE 1 TABLET(1 MG) BY MOUTH TWICE DAILY AS NEEDED 60 tablet 2   metroNIDAZOLE (METROGEL) 0.75 % gel Apply to face 1-2 times daily 45 g 1   Omega-3 Fatty Acids (FISH OIL) 1200 MG CAPS Take 1 capsule by mouth in the morning and at bedtime.     Probiotic Product (ALIGN) 4 MG CAPS Take 1 capsule by mouth daily in the afternoon.     sodium bicarbonate 650 MG tablet Take 650 mg by mouth 2 (two) times daily.     traZODone (DESYREL) 50 MG tablet Take 50 mg by mouth at bedtime as needed for sleep.     No current facility-administered medications for this visit.    Medication Side Effects: None  Allergies:  Allergies  Allergen Reactions   Aripiprazole Other (See Comments)    Parkinsonism      Lactose Intolerance (Gi) Diarrhea   Methotrexate Other (See Comments)    Hair loss, severe stomatitis     Cefdinir Diarrhea    Other reaction(s): Diarrhea Yeast infection and fever; negative c diff   Etanercept Other (See Comments)    Headaches     Exemestane Other (See Comments)    Suicidal thoughts with medication     Fluoxetine Other (See Comments)    Parkinsonism   Methylprednisolone Sodium Succ Other (See Comments)    Agitated mania   Epinephrine Palpitations    tachycardia    Nitrofurantoin Other (See Comments)     GI upset from 2012    Past Medical History:  Diagnosis Date   Bipolar 1 disorder (HCC)    Chronic diarrhea    loose stools twice a day on average for years   CKD stage G4/A1, 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 1.4 cm  Grade I invasive lobular, ER and PR +/Her--, Ki 67 <5% Tried anastrozole for one month, but developed suicidal idea   Osteoarthritis, knee 09/24/2019   Xray 09/2019   Parkinsonism    Drug induced   Polyposis of colon     Psoriatic arthritis (HCC)    PTSD (post-traumatic stress disorder)    Secondary hyperparathyroidism (HCC)    Tardive dyskinesia    Thyroid disease     Family History  Problem Relation Age of Onset   Lymphoma Mother 42   Lymphoma Sister 89   Breast cancer Sister 16   Colon polyps Sister    Lung cancer Sister        former smoker   Stroke Maternal Grandfather    Diabetes Paternal Grandfather    Colon cancer Neg Hx    Esophageal cancer Neg Hx    Stomach cancer Neg Hx    Rectal cancer Neg Hx     Social History   Socioeconomic History   Marital status: Married    Spouse name: Koren Bound   Number of children: 3   Years of education: Not on file   Highest education level: Not on file  Occupational History   Occupation: retired  Tobacco Use   Smoking status:  Never   Smokeless tobacco: Never  Vaping Use   Vaping status: Never Used  Substance and Sexual Activity   Alcohol use: Never   Drug use: Never   Sexual activity: Not Currently  Other Topics Concern   Not on file  Social History Narrative   Moved to area from [place] 08/2018   Lives one story home   Right handed.   Social Determinants of Health   Financial Resource Strain: Low Risk  (04/15/2022)   Overall Financial Resource Strain (CARDIA)    Difficulty of Paying Living Expenses: Not hard at all  Food Insecurity: No Food Insecurity (04/15/2022)   Hunger Vital Sign    Worried About Running Out of Food in the Last Year: Never true    Ran Out of Food in the Last Year: Never true  Transportation Needs: No Transportation Needs (04/15/2022)   PRAPARE - Administrator, Civil Service (Medical): No    Lack of Transportation (Non-Medical): No  Physical Activity: Sufficiently Active (04/15/2022)   Exercise Vital Sign    Days of Exercise per Week: 5 days    Minutes of Exercise per Session: 60 min  Stress: No Stress Concern Present (04/02/2021)   [employer] of Occupational Health - Occupational Stress  Questionnaire    Feeling of Stress : Not at all  Social Connections: Moderately Integrated (04/15/2022)   Social Connection and Isolation Panel [NHANES]    Frequency of Communication with Friends and Family: More than three times a week    Frequency of Social Gatherings with Friends and Family: More than three times a week    Attends Religious Services: More than 4 times per year    Active Member of [employer] or Organizations: No    Attends Banker Meetings: Never    Marital Status: Married  Catering manager Violence: Not At Risk (04/15/2022)   Humiliation, Afraid, Rape, and Kick questionnaire    Fear of Current or Ex-Partner: No    Emotionally Abused: No    Physically Abused: No    Sexually Abused: No    Past Medical History, Surgical history, Social history, and Family history were reviewed and updated as appropriate.   Please see review of systems for further details on the patient's review from today.   Objective:   Physical Exam:  There were no vitals taken for this visit.  Physical Exam Constitutional:      General: She is not in acute distress. Musculoskeletal:        General: No deformity.  Neurological:     Mental Status: She is alert and oriented to person, place, and time.     Coordination: Coordination normal.  Psychiatric:        Attention and Perception: Attention and perception normal. She does not perceive auditory or visual hallucinations.        Mood and Affect: Mood normal. Affect is not labile, blunt, angry or inappropriate.        Speech: Speech normal.        Behavior: Behavior normal.        Thought Content: Thought content normal. Thought content is not paranoid or delusional. Thought content does not include homicidal or suicidal ideation. Thought content does not include homicidal or suicidal plan.        Cognition and Memory: Cognition and memory normal.        Judgment: Judgment normal.     Comments: Insight intact     Lab Review:  Component Value Date/Time   NA 141 07/12/2022 1108   NA 142 10/17/2021 0000   K 4.3 07/12/2022 1108   CL 105 07/12/2022 1108   CO2 26 07/12/2022 1108   GLUCOSE 69 (L) 07/12/2022 1108   BUN 32 (H) 07/12/2022 1108   BUN 30 (A) 10/17/2021 0000   CREATININE 2.27 (H) 07/12/2022 1108   CREATININE 2.29 (H) 05/22/2021 1044   CALCIUM 8.9 07/19/2022 1100   PROT 7.1 07/15/2022 1515   ALBUMIN 4.4 07/15/2022 1515   AST 25 07/15/2022 1515   ALT 29 07/15/2022 1515   ALKPHOS 138 (H) 07/15/2022 1515   BILITOT 0.9 07/15/2022 1515   GFRNONAA 22 (L) 04/03/2022 1425   GFRAA 25 (L) 09/13/2019 1243       Component Value Date/Time   WBC 7.7 07/12/2022 1108   RBC 4.18 07/12/2022 1108   HGB 12.9 07/12/2022 1108   HCT 38.6 07/12/2022 1108   PLT 201.0 07/12/2022 1108   MCV 92.3 07/12/2022 1108   MCH 30.5 04/03/2022 1425   MCHC 33.4 07/12/2022 1108   RDW 13.5 07/12/2022 1108   LYMPHSABS 3.4 07/12/2022 1108   MONOABS 0.9 07/12/2022 1108   EOSABS 0.3 07/12/2022 1108   BASOSABS 0.1 07/12/2022 1108    Lithium Lvl  Date Value Ref Range Status  05/22/2021 0.7 0.6 - 1.2 mmol/L Final     No results found for: "PHENYTOIN", "PHENOBARB", "VALPROATE", "CBMZ"   .res Assessment: Plan:    Plan:  Lorazepam 1mg  BID for anxiety - may take one tablet extra for severe anxiety symptoms. Taking one at night routinely.  Lamictal 200mg  hs.  Trazadone 50mg  as needed  NAC tablets BID - for obsessive thoughts, worry, and rumination - unable to take SSRI's  RTC weekly  Counseled patient regarding potential benefits, risks, and side effects of Lamictal to include potential risk of Stevens-Johnson syndrome. Advised patient to stop taking Lamictal and contact office immediately if rash develops and to seek urgent medical attention if rash is severe and/or spreading quickly.  There are no diagnoses linked to this encounter.   Please see After Visit Summary for patient specific instructions.  Future  Appointments  Date Time Provider Department Center  10/09/2022 12:00 PM Grayland Daisey, Thereasa Solo, NP [facility] None  10/16/2022  1:40 PM Donny Heffern, Thereasa Solo, NP [facility] None  10/23/2022  2:40 PM Shallen Luedke, Thereasa Solo, NP [facility] None  10/29/2022  1:20 PM Pricilla Riffle, MD [facility]  10/30/2022  1:00 PM Shaya Altamura, Thereasa Solo, NP [facility] None  11/06/2022  1:40 PM Azeneth Carbonell, Thereasa Solo, NP [facility] None  11/12/2022 11:50 AM Shamleffer, Konrad Dolores, MD [facility] None  11/14/2022 11:30 AM Van Clines, MD [facility] None  04/24/2023  2:00 PM [facility] ANNUAL WELLNESS VISIT 1 [facility]    No orders of the defined types were placed in this encounter.     -------------------------------

## 2022-10-16 ENCOUNTER — Encounter: Payer: Self-pay | Admitting: Adult Health

## 2022-10-16 ENCOUNTER — Telehealth (INDEPENDENT_AMBULATORY_CARE_PROVIDER_SITE_OTHER): Payer: Medicare Other | Admitting: Adult Health

## 2022-10-16 DIAGNOSIS — F5105 Insomnia due to other mental disorder: Secondary | ICD-10-CM

## 2022-10-16 DIAGNOSIS — F99 Mental disorder, not otherwise specified: Secondary | ICD-10-CM

## 2022-10-16 DIAGNOSIS — F411 Generalized anxiety disorder: Secondary | ICD-10-CM | POA: Diagnosis not present

## 2022-10-16 DIAGNOSIS — F4310 Post-traumatic stress disorder, unspecified: Secondary | ICD-10-CM

## 2022-10-16 DIAGNOSIS — F319 Bipolar disorder, unspecified: Secondary | ICD-10-CM | POA: Diagnosis not present

## 2022-10-16 NOTE — Progress Notes (Signed)
Tiffany [name] [id number] 10/17/1950 72 y.o.  Virtual Visit via Video Note  I connected with pt @ on 10/16/22 at  1:40 PM EDT by a video enabled telemedicine application and verified that I am speaking with the correct person using two identifiers.   I discussed the limitations of evaluation and management by telemedicine and the availability of in person appointments. The patient expressed understanding and agreed to proceed.  I discussed the assessment and treatment plan with the patient. The patient was provided an opportunity to ask questions and all were answered. The patient agreed with the plan and demonstrated an understanding of the instructions.   The patient was advised to call back or seek an in-person evaluation if the symptoms worsen or if the condition fails to improve as anticipated.  I provided 25 minutes of non-face-to-face time during this encounter.  The patient was located at home.  The provider was located at [facility].   [name], NP   Subjective:   Patient ID:  Tiffany [name] is a 72 y.o. (DOB 1950/03/18) female.  Chief Complaint: No chief complaint on file.   HPI Tiffany [name] presents for follow-up of PTSD, insomnia, GAD and BPD- 1.  Describes mood today as "it varies". Pleasant. Reports tearfulness. Mood symptoms - reports mood continues to "cycle". Reports some depression, anxiety, and irritability during periods of cycling. Reports spending more than usual - shopping. Denies panic attacks. Denies anger outbursts. Reports worry, rumination, and over thinking. Reports feeling overwhelmed at times. Reports racing thoughts. Mood is variable. Stating "I feel like I'm doing ok right now". Feels like medications are helpful. Family supportive. Varying interest and motivation.Taking medications as prescribed.  Energy levels fluctuates. Active, exercising. Enjoys some usual interests and activities. Married. Lives with husband and daughter. Talking to  family and friends.  Appetite adequate. Weight stable - 177 pounds. Sleeps well most nights. Averages 7 hours of broken sleep. Denies daytime napping. Focus and concentration variable. Completing tasks. Managing some aspects of household. Retired.  Reports SI - 3 times over past week - no plan. Denies HI.  Denies AH or VH. Denies self harm. Denies substance use.  Reports arthritic pain - fluctuates.  Previous medications: Celexa, Zyprexa, Tegretol, Depakote, Serzone, Topamax, Seroquel, Effexor, Lexapro, Desipramine, Neurontin, Abilify, Geodon, Propanolol, Cymbalta, Cogentin, Trihexyphenadyl, Sinmmet, Provigil, Selegiline, Requip, Amantadine, Prozac, Mirapex, Azilect, Metoclopramide, Baclofen, Artane, Namenda, Latuda, Lithium,    Review of Systems:  Review of Systems  Musculoskeletal:  Negative for gait problem.  Neurological:  Negative for tremors.  Psychiatric/Behavioral:         Please refer to HPI    Medications: I have reviewed the patient's current medications.  Current Outpatient Medications  Medication Sig Dispense Refill   Acetylcysteine (N-ACETYL CYSTEINE) 600 MG CAPS 1 capsule Orally Twice a day     amLODipine (NORVASC) 5 MG tablet Take 1 tablet (5 mg total) by mouth daily. 90 tablet 1   atorvastatin (LIPITOR) 10 MG tablet TAKE 1 TABLET(10 MG) BY MOUTH DAILY 90 tablet 1   augmented betamethasone dipropionate (DIPROLENE-AF) 0.05 % cream APPLY TO THE AFFECTED AREAS OF LEG TWICE DAILY AS NEEDED FOR RASH. NOT TO FACE, GROIN, UNDERARMS 50 g 0   B Complex Vitamins (VITAMIN B COMPLEX) TABS 1 tablet Orally Once daily     betamethasone dipropionate 0.05 % cream 1 application Externally Twice a day     Cholecalciferol 50 MCG (2000 UT) TABS 1 tablet Orally Once a day for 30 day(s)  Golimumab (SIMPONI ARIA IV) Infusion every 8 weeks     lamoTRIgine (LAMICTAL) 200 MG tablet Take 1 tablet (200 mg total) by mouth at bedtime. 90 tablet 3   levothyroxine (SYNTHROID) 50 MCG tablet Take  1 tablet (50 mcg total) by mouth daily. 30 tablet 6   LORazepam (ATIVAN) 1 MG tablet TAKE 1 TABLET(1 MG) BY MOUTH TWICE DAILY AS NEEDED 60 tablet 2   metroNIDAZOLE (METROGEL) 0.75 % gel Apply to face 1-2 times daily 45 g 1   Omega-3 Fatty Acids (FISH OIL) 1200 MG CAPS Take 1 capsule by mouth in the morning and at bedtime.     Probiotic Product (ALIGN) 4 MG CAPS Take 1 capsule by mouth daily in the afternoon.     sodium bicarbonate 650 MG tablet Take 650 mg by mouth 2 (two) times daily.     traZODone (DESYREL) 50 MG tablet Take 50 mg by mouth at bedtime as needed for sleep.     No current facility-administered medications for this visit.    Medication Side Effects: None  Allergies:  Allergies  Allergen Reactions   Aripiprazole Other (See Comments)    Parkinsonism      Lactose Intolerance (Gi) Diarrhea   Methotrexate Other (See Comments)    Hair loss, severe stomatitis     Cefdinir Diarrhea    Other reaction(s): Diarrhea Yeast infection and fever; negative c diff   Etanercept Other (See Comments)    Headaches     Exemestane Other (See Comments)    Suicidal thoughts with medication     Fluoxetine Other (See Comments)    Parkinsonism   Methylprednisolone Sodium Succ Other (See Comments)    Agitated mania   Epinephrine Palpitations    tachycardia    Nitrofurantoin Other (See Comments)     GI upset from 2012    Past Medical History:  Diagnosis Date   Bipolar 1 disorder (HCC)    Chronic diarrhea    loose stools twice a day on average for years   CKD stage G4/A1, 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 1.4 cm  Grade I invasive lobular, ER and PR +/Her--, Ki 67 <5% Tried anastrozole for one month, but developed suicidal idea   Osteoarthritis, knee 09/24/2019   Xray  09/2019   Parkinsonism    Drug induced   Polyposis of colon    Psoriatic arthritis (HCC)    PTSD (post-traumatic stress disorder)    Secondary hyperparathyroidism (HCC)    Tardive dyskinesia    Thyroid disease     Family History  Problem Relation Age of Onset   Lymphoma Mother 36   Lymphoma Sister 22   Breast cancer Sister 62   Colon polyps Sister    Lung cancer Sister        former smoker   Stroke Maternal Grandfather    Diabetes Paternal Grandfather    Colon cancer Neg Hx    Esophageal cancer Neg Hx    Stomach cancer Neg Hx    Rectal cancer Neg Hx     Social History   Socioeconomic History   Marital status: Married    Spouse name: Koren Bound   Number of children: 3   Years of education: Not on file   Highest education level: Not on file  Occupational  History   Occupation: retired  Tobacco Use   Smoking status: Never   Smokeless tobacco: Never  Vaping Use   Vaping status: Never Used  Substance and Sexual Activity   Alcohol use: Never   Drug use: Never   Sexual activity: Not Currently  Other Topics Concern   Not on file  Social History Narrative   Moved to area from [place] 08/2018   Lives one story home   Right handed.   Social Determinants of Health   Financial Resource Strain: Low Risk  (04/15/2022)   Overall Financial Resource Strain (CARDIA)    Difficulty of Paying Living Expenses: Not hard at all  Food Insecurity: No Food Insecurity (04/15/2022)   Hunger Vital Sign    Worried About Running Out of Food in the Last Year: Never true    Ran Out of Food in the Last Year: Never true  Transportation Needs: No Transportation Needs (04/15/2022)   PRAPARE - Administrator, Civil Service (Medical): No    Lack of Transportation (Non-Medical): No  Physical Activity: Sufficiently Active (04/15/2022)   Exercise Vital Sign    Days of Exercise per Week: 5 days    Minutes of Exercise per Session: 60 min  Stress: No Stress Concern Present (04/02/2021)   [employer] of Occupational Health - Occupational Stress Questionnaire    Feeling of Stress : Not at all  Social Connections: Moderately Integrated (04/15/2022)   Social Connection and Isolation Panel [NHANES]    Frequency of Communication with Friends and Family: More than three times a week    Frequency of Social Gatherings with Friends and Family: More than three times a week    Attends Religious Services: More than 4 times per year    Active Member of [employer] or Organizations: No    Attends Banker Meetings: Never    Marital Status: Married  Catering manager Violence: Not At Risk (04/15/2022)   Humiliation, Afraid, Rape, and Kick questionnaire    Fear of Current or Ex-Partner: No    Emotionally Abused: No    Physically Abused: No    Sexually Abused: No    Past Medical History, Surgical history, Social history, and Family history were reviewed and updated as appropriate.   Please see review of systems for further details on the patient's review from today.   Objective:   Physical Exam:  There were no vitals taken for this visit.  Physical Exam Constitutional:      General: She is not in acute distress. Musculoskeletal:        General: No deformity.  Neurological:     Mental Status: She is alert and oriented to person, place, and time.     Coordination: Coordination normal.  Psychiatric:        Attention and Perception: Attention and perception normal. She does not perceive auditory or visual hallucinations.        Mood and Affect: Affect is not labile, blunt, angry or inappropriate.        Speech: Speech normal.        Behavior: Behavior normal.        Thought Content: Thought content normal. Thought content is not paranoid or delusional. Thought content does not include homicidal or suicidal ideation. Thought content does not include homicidal or suicidal plan.        Cognition and Memory: Cognition and memory normal.        Judgment: Judgment normal.     Comments:  Insight intact     Lab Review:     Component Value Date/Time   NA 141 07/12/2022 1108   NA 142 10/17/2021 0000   K 4.3 07/12/2022 1108   CL 105 07/12/2022 1108   CO2 26 07/12/2022 1108   GLUCOSE 69 (L) 07/12/2022 1108   BUN 32 (H) 07/12/2022 1108   BUN 30 (A) 10/17/2021 0000   CREATININE 2.27 (H) 07/12/2022 1108   CREATININE 2.29 (H) 05/22/2021 1044   CALCIUM 8.9 07/19/2022 1100   PROT 7.1 07/15/2022 1515   ALBUMIN 4.4 07/15/2022 1515   AST 25 07/15/2022 1515   ALT 29 07/15/2022 1515   ALKPHOS 138 (H) 07/15/2022 1515   BILITOT 0.9 07/15/2022 1515   GFRNONAA 22 (L) 04/03/2022 1425   GFRAA 25 (L) 09/13/2019 1243       Component Value Date/Time   WBC 7.7 07/12/2022 1108   RBC 4.18 07/12/2022 1108   HGB 12.9 07/12/2022 1108   HCT 38.6 07/12/2022 1108   PLT 201.0 07/12/2022 1108   MCV 92.3 07/12/2022 1108   MCH 30.5 04/03/2022 1425   MCHC 33.4 07/12/2022 1108   RDW 13.5 07/12/2022 1108   LYMPHSABS 3.4 07/12/2022 1108   MONOABS 0.9 07/12/2022 1108   EOSABS 0.3 07/12/2022 1108   BASOSABS 0.1 07/12/2022 1108    Lithium Lvl  Date Value Ref Range Status  05/22/2021 0.7 0.6 - 1.2 mmol/L Final     No results found for: "PHENYTOIN", "PHENOBARB", "VALPROATE", "CBMZ"   .res Assessment: Plan:    Plan:  Lorazepam 1mg  BID for anxiety - may take one tablet extra for severe anxiety symptoms. Taking one at night routinely.  Lamictal 200mg  hs.  Trazadone 50mg  as needed  NAC tablets BID - for obsessive thoughts, worry, and rumination - unable to take SSRI's  RTC weekly  Counseled patient regarding potential benefits, risks, and side effects of Lamictal to include potential risk of Stevens-Johnson syndrome. Advised patient to stop taking Lamictal and contact office immediately if rash develops and to seek urgent medical attention if rash is severe and/or spreading quickly.  There are no diagnoses linked to this encounter.   Please see After Visit Summary for patient  specific instructions.  Future Appointments  Date Time Provider Department Center  10/16/2022  1:40 PM , Thereasa Solo, NP [facility] None  10/23/2022  2:40 PM , Thereasa Solo, NP [facility] None  10/29/2022  1:20 PM Pricilla Riffle, MD [facility]  10/30/2022  1:00 PM , Thereasa Solo, NP [facility] None  11/06/2022  1:40 PM , Thereasa Solo, NP [facility] None  11/12/2022 11:50 AM Shamleffer, Konrad Dolores, MD [facility] None  11/14/2022 11:30 AM Van Clines, MD [facility] None  04/24/2023  2:00 PM [facility] ANNUAL WELLNESS VISIT 1 [facility]    No orders of the defined types were placed in this encounter.     -------------------------------

## 2022-10-22 ENCOUNTER — Telehealth: Payer: Self-pay | Admitting: Internal Medicine

## 2022-10-22 ENCOUNTER — Telehealth: Payer: Self-pay | Admitting: Physician Assistant

## 2022-10-22 DIAGNOSIS — E039 Hypothyroidism, unspecified: Secondary | ICD-10-CM

## 2022-10-22 DIAGNOSIS — F3160 Bipolar disorder, current episode mixed, unspecified: Secondary | ICD-10-CM

## 2022-10-22 NOTE — Telephone Encounter (Signed)
Spoke to pt's husband Zollie Beckers told him order for referral has been placed as requested. Zollie Beckers verbalized understanding.

## 2022-10-22 NOTE — Telephone Encounter (Signed)
Patent's spouse called and stated patient wants to go to [facility] behavioral medicine. States the practice informed them that since they had medicare tey need a referral from their PCP. Can this referral be placed? Please advise.

## 2022-10-22 NOTE — Telephone Encounter (Signed)
Patient's spouse, Tieshia Rackard, called and is asking if Coua can be scheduled for TSH.  Patient is scheduled for 10/25/2022 at 10:30 AM for labs.

## 2022-10-23 ENCOUNTER — Telehealth (INDEPENDENT_AMBULATORY_CARE_PROVIDER_SITE_OTHER): Payer: Medicare Other | Admitting: Adult Health

## 2022-10-23 ENCOUNTER — Encounter: Payer: Self-pay | Admitting: Adult Health

## 2022-10-23 DIAGNOSIS — F4310 Post-traumatic stress disorder, unspecified: Secondary | ICD-10-CM | POA: Diagnosis not present

## 2022-10-23 DIAGNOSIS — F5105 Insomnia due to other mental disorder: Secondary | ICD-10-CM | POA: Diagnosis not present

## 2022-10-23 DIAGNOSIS — F411 Generalized anxiety disorder: Secondary | ICD-10-CM | POA: Diagnosis not present

## 2022-10-23 DIAGNOSIS — F99 Mental disorder, not otherwise specified: Secondary | ICD-10-CM

## 2022-10-23 DIAGNOSIS — F319 Bipolar disorder, unspecified: Secondary | ICD-10-CM

## 2022-10-23 NOTE — Progress Notes (Signed)
Tiffany [name] [place] [id number] [birth year] 72 y.o.  Virtual Visit via Video Note  I connected with pt @ on 10/23/22 at  2:40 PM EDT by a video enabled telemedicine application and verified that I am speaking with the correct person using two identifiers.   I discussed the limitations of evaluation and management by telemedicine and the availability of in person appointments. The patient expressed understanding and agreed to proceed.  I discussed the assessment and treatment plan with the patient. The patient was provided an opportunity to ask questions and all were answered. The patient agreed with the plan and demonstrated an understanding of the instructions.   The patient was advised to call back or seek an in-person evaluation if the symptoms worsen or if the condition fails to improve as anticipated.  I provided 25 minutes of non-face-to-face time during this encounter.  The patient was located at home.  The provider was located at [facility].   [name], NP   Subjective:   Patient ID:  Tiffany [name] is a 72 y.o. (DOB 03/07/1951) female.  Chief Complaint: No chief complaint on file.   HPI Tiffany [name] presents for follow-up of PTSD, insomnia, GAD and BPD- 1.  Describes mood today as "not so good". Pleasant. Reports tearfulness. Mood symptoms - reports rapid cycling. Reports a "deep depression" - every day it comes and goes. Reports anxiety and irritability. Denies panic attacks. Reports one recent outburst. Reports feels super hyper sensitive to everything. Reports grieving. Feels like her perceptions are off. More sensitive to noise and stimuli. Reports worry, rumination, and over thinking. Reports feeling overwhelmed at times. Reports racing thoughts. Mood is variable. Stating "I'm not doing good - I've had a terrible week". Has gotten through the first chapter of BPD work book. Feels like medications are helpful. Family supportive. Varying interest and motivation.Taking  medications as prescribed.  Energy levels fluctuates. Active, exercising. Enjoys some usual interests and activities. Married. Lives with husband and daughter. Talking to family and friends.  Appetite adequate. Weight stable - 177 pounds. Sleeps well most nights. Averages 8 hours of broken sleep. Denies daytime napping. Focus and concentration variable. Completing tasks. Managing some aspects of household. Retired.  Reports passive SI this week - nothing over past few weeks - no plan. Denies HI.  Denies AH or VH. Denies self harm. Denies substance use.  Reports arthritic pain - fluctuates.  Previous medications: Celexa, Zyprexa, Tegretol, Depakote, Serzone, Topamax, Seroquel, Effexor, Lexapro, Desipramine, Neurontin, Abilify, Geodon, Propanolol, Cymbalta, Cogentin, Trihexyphenadyl, Sinmmet, Provigil, Selegiline, Requip, Amantadine, Prozac, Mirapex, Azilect, Metoclopramide, Baclofen, Artane, Namenda, Latuda, Lithium,    Review of Systems:  Review of Systems  Musculoskeletal:  Negative for gait problem.  Neurological:  Negative for tremors.  Psychiatric/Behavioral:         Please refer to HPI    Medications: I have reviewed the patient's current medications.  Current Outpatient Medications  Medication Sig Dispense Refill   Acetylcysteine (N-ACETYL CYSTEINE) 600 MG CAPS 1 capsule Orally Twice a day     amLODipine (NORVASC) 5 MG tablet Take 1 tablet (5 mg total) by mouth daily. 90 tablet 1   atorvastatin (LIPITOR) 10 MG tablet TAKE 1 TABLET(10 MG) BY MOUTH DAILY 90 tablet 1   augmented betamethasone dipropionate (DIPROLENE-AF) 0.05 % cream APPLY TO THE AFFECTED AREAS OF LEG TWICE DAILY AS NEEDED FOR RASH. NOT TO FACE, GROIN, UNDERARMS 50 g 0   B Complex Vitamins (VITAMIN B COMPLEX) TABS 1 tablet Orally Once daily  betamethasone dipropionate 0.05 % cream 1 application Externally Twice a day     Cholecalciferol 50 MCG (2000 UT) TABS 1 tablet Orally Once a day for 30 day(s)      Golimumab (SIMPONI ARIA IV) Infusion every 8 weeks     lamoTRIgine (LAMICTAL) 200 MG tablet Take 1 tablet (200 mg total) by mouth at bedtime. 90 tablet 3   levothyroxine (SYNTHROID) 50 MCG tablet Take 1 tablet (50 mcg total) by mouth daily. 30 tablet 6   LORazepam (ATIVAN) 1 MG tablet TAKE 1 TABLET(1 MG) BY MOUTH TWICE DAILY AS NEEDED 60 tablet 2   metroNIDAZOLE (METROGEL) 0.75 % gel Apply to face 1-2 times daily 45 g 1   Omega-3 Fatty Acids (FISH OIL) 1200 MG CAPS Take 1 capsule by mouth in the morning and at bedtime.     Probiotic Product (ALIGN) 4 MG CAPS Take 1 capsule by mouth daily in the afternoon.     sodium bicarbonate 650 MG tablet Take 650 mg by mouth 2 (two) times daily.     traZODone (DESYREL) 50 MG tablet Take 50 mg by mouth at bedtime as needed for sleep.     No current facility-administered medications for this visit.    Medication Side Effects: None  Allergies:  Allergies  Allergen Reactions   Aripiprazole Other (See Comments)    Parkinsonism      Lactose Intolerance (Gi) Diarrhea   Methotrexate Other (See Comments)    Hair loss, severe stomatitis     Cefdinir Diarrhea    Other reaction(s): Diarrhea Yeast infection and fever; negative c diff   Etanercept Other (See Comments)    Headaches     Exemestane Other (See Comments)    Suicidal thoughts with medication     Fluoxetine Other (See Comments)    Parkinsonism   Methylprednisolone Sodium Succ Other (See Comments)    Agitated mania   Epinephrine Palpitations    tachycardia    Nitrofurantoin Other (See Comments)     GI upset from 2012    Past Medical History:  Diagnosis Date   Bipolar 1 disorder (HCC)    Chronic diarrhea    loose stools twice a day on average for years   CKD stage G4/A1, 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 1.4 cm  Grade I invasive lobular, ER and PR +/Her--, Ki 67 <5% Tried anastrozole for one month, but developed suicidal idea   Osteoarthritis, knee 09/24/2019   Xray 09/2019   Parkinsonism    Drug induced   Polyposis of colon    Psoriatic arthritis (HCC)    PTSD (post-traumatic stress disorder)    Secondary hyperparathyroidism (HCC)    Tardive dyskinesia    Thyroid disease     Family History  Problem Relation Age of Onset   Lymphoma Mother 40   Lymphoma Sister 26   Breast cancer Sister 81   Colon polyps Sister    Lung cancer Sister        former smoker   Stroke Maternal Grandfather    Diabetes Paternal Grandfather    Colon cancer Neg Hx    Esophageal cancer Neg Hx    Stomach cancer Neg Hx    Rectal cancer Neg Hx     Social History   Socioeconomic History  Marital status: Married    Spouse name: Koren Bound   Number of children: 3   Years of education: Not on file   Highest education level: Not on file  Occupational History   Occupation: retired  Tobacco Use   Smoking status: Never   Smokeless tobacco: Never  Vaping Use   Vaping status: Never Used  Substance and Sexual Activity   Alcohol use: Never   Drug use: Never   Sexual activity: Not Currently  Other Topics Concern   Not on file  Social History Narrative   Moved to area from [place] 08/2018   Lives one story home   Right handed.   Social Determinants of Health   Financial Resource Strain: Low Risk  (04/15/2022)   Overall Financial Resource Strain (CARDIA)    Difficulty of Paying Living Expenses: Not hard at all  Food Insecurity: No Food Insecurity (04/15/2022)   Hunger Vital Sign    Worried About Running Out of Food in the Last Year: Never true    Ran Out of Food in the Last Year: Never true  Transportation Needs: No Transportation Needs (04/15/2022)   PRAPARE - Administrator, Civil Service (Medical): No    Lack of Transportation (Non-Medical): No   Physical Activity: Sufficiently Active (04/15/2022)   Exercise Vital Sign    Days of Exercise per Week: 5 days    Minutes of Exercise per Session: 60 min  Stress: No Stress Concern Present (04/02/2021)   [employer] of Occupational Health - Occupational Stress Questionnaire    Feeling of Stress : Not at all  Social Connections: Moderately Integrated (04/15/2022)   Social Connection and Isolation Panel [NHANES]    Frequency of Communication with Friends and Family: More than three times a week    Frequency of Social Gatherings with Friends and Family: More than three times a week    Attends Religious Services: More than 4 times per year    Active Member of [employer] or Organizations: No    Attends Banker Meetings: Never    Marital Status: Married  Catering manager Violence: Not At Risk (04/15/2022)   Humiliation, Afraid, Rape, and Kick questionnaire    Fear of Current or Ex-Partner: No    Emotionally Abused: No    Physically Abused: No    Sexually Abused: No    Past Medical History, Surgical history, Social history, and Family history were reviewed and updated as appropriate.   Please see review of systems for further details on the patient's review from today.   Objective:   Physical Exam:  There were no vitals taken for this visit.  Physical Exam Constitutional:      General: She is not in acute distress. Musculoskeletal:        General: No deformity.  Neurological:     Mental Status: She is alert and oriented to person, place, and time.     Coordination: Coordination normal.  Psychiatric:        Attention and Perception: Attention and perception normal. She does not perceive auditory or visual hallucinations.        Mood and Affect: Affect is not labile, blunt, angry or inappropriate.        Speech: Speech normal.        Behavior: Behavior normal.        Thought Content: Thought content normal. Thought content is not paranoid or delusional. Thought content  does not include homicidal or suicidal ideation. Thought content does not include  homicidal or suicidal plan.        Cognition and Memory: Cognition and memory normal.        Judgment: Judgment normal.     Comments: Insight intact     Lab Review:     Component Value Date/Time   NA 141 07/12/2022 1108   NA 142 10/17/2021 0000   K 4.3 07/12/2022 1108   CL 105 07/12/2022 1108   CO2 26 07/12/2022 1108   GLUCOSE 69 (L) 07/12/2022 1108   BUN 32 (H) 07/12/2022 1108   BUN 30 (A) 10/17/2021 0000   CREATININE 2.27 (H) 07/12/2022 1108   CREATININE 2.29 (H) 05/22/2021 1044   CALCIUM 8.9 07/19/2022 1100   PROT 7.1 07/15/2022 1515   ALBUMIN 4.4 07/15/2022 1515   AST 25 07/15/2022 1515   ALT 29 07/15/2022 1515   ALKPHOS 138 (H) 07/15/2022 1515   BILITOT 0.9 07/15/2022 1515   GFRNONAA 22 (L) 04/03/2022 1425   GFRAA 25 (L) 09/13/2019 1243       Component Value Date/Time   WBC 7.7 07/12/2022 1108   RBC 4.18 07/12/2022 1108   HGB 12.9 07/12/2022 1108   HCT 38.6 07/12/2022 1108   PLT 201.0 07/12/2022 1108   MCV 92.3 07/12/2022 1108   MCH 30.5 04/03/2022 1425   MCHC 33.4 07/12/2022 1108   RDW 13.5 07/12/2022 1108   LYMPHSABS 3.4 07/12/2022 1108   MONOABS 0.9 07/12/2022 1108   EOSABS 0.3 07/12/2022 1108   BASOSABS 0.1 07/12/2022 1108    Lithium Lvl  Date Value Ref Range Status  05/22/2021 0.7 0.6 - 1.2 mmol/L Final     No results found for: "PHENYTOIN", "PHENOBARB", "VALPROATE", "CBMZ"   .res Assessment: Plan:    Plan:  Lorazepam 1mg  BID for anxiety - may take one tablet extra for severe anxiety symptoms. Taking one at night routinely.  Lamictal 200mg  hs.  Trazadone 50mg  as needed  NAC tablets BID - for obsessive thoughts, worry, and rumination - unable to take SSRI's  RTC weekly  Counseled patient regarding potential benefits, risks, and side effects of Lamictal to include potential risk of Stevens-Johnson syndrome. Advised patient to stop taking Lamictal and contact  office immediately if rash develops and to seek urgent medical attention if rash is severe and/or spreading quickly. There are no diagnoses linked to this encounter.   Please see After Visit Summary for patient specific instructions.  Future Appointments  Date Time Provider Department Center  10/23/2022  2:40 PM , Thereasa Solo, NP [facility] None  10/25/2022 10:30 AM [facility] END[facility]/NEURO LAB [facility] None  10/29/2022  1:20 PM Pricilla Riffle, MD [facility]  10/30/2022  1:00 PM , Thereasa Solo, NP [facility] None  11/06/2022 11:20 AM , Thereasa Solo, NP [facility] None  11/12/2022 11:50 AM Shamleffer, Konrad Dolores, MD [facility] None  11/14/2022 11:30 AM Van Clines, MD [facility] None  11/15/2022  1:20 PM , Thereasa Solo, NP [facility] None  11/22/2022  1:00 PM , Thereasa Solo, NP [facility] None  04/24/2023  2:00 PM [facility] ANNUAL WELLNESS VISIT 1 [facility]    No orders of the defined types were placed in this encounter.     -------------------------------

## 2022-10-23 NOTE — Telephone Encounter (Signed)
Labs in

## 2022-10-25 ENCOUNTER — Other Ambulatory Visit: Payer: Medicare Other

## 2022-10-25 DIAGNOSIS — E039 Hypothyroidism, unspecified: Secondary | ICD-10-CM

## 2022-10-25 LAB — T4, FREE: Free T4: 0.82 ng/dL (ref 0.60–1.60)

## 2022-10-25 LAB — TSH: TSH: 4.62 u[IU]/mL (ref 0.35–5.50)

## 2022-10-28 ENCOUNTER — Telehealth (INDEPENDENT_AMBULATORY_CARE_PROVIDER_SITE_OTHER): Payer: Medicare Other | Admitting: Adult Health

## 2022-10-28 ENCOUNTER — Encounter: Payer: Self-pay | Admitting: Adult Health

## 2022-10-28 DIAGNOSIS — F5105 Insomnia due to other mental disorder: Secondary | ICD-10-CM | POA: Diagnosis not present

## 2022-10-28 DIAGNOSIS — F319 Bipolar disorder, unspecified: Secondary | ICD-10-CM | POA: Diagnosis not present

## 2022-10-28 DIAGNOSIS — F99 Mental disorder, not otherwise specified: Secondary | ICD-10-CM

## 2022-10-28 DIAGNOSIS — F411 Generalized anxiety disorder: Secondary | ICD-10-CM

## 2022-10-28 DIAGNOSIS — F4310 Post-traumatic stress disorder, unspecified: Secondary | ICD-10-CM

## 2022-10-28 NOTE — Progress Notes (Signed)
Tiffany [name] is a 72 y.o. female here for a follow up of a pre-existing problem.  History of Present Illness:   No chief complaint on file.   HPI  Balance Issues/Nausea: First noted 05/10/22 after presenting to [facility].  Neurological exam resulted normal.  Uses half or quarter of 1 mg Lorazepam as needed.   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 1.4 cm  Grade I invasive lobular, ER and PR +/Her--, Ki 67 <5% Tried anastrozole for one month, but developed suicidal idea   Osteoarthritis, knee 09/24/2019   Xray 09/2019   Parkinsonism    Drug induced   Polyposis of colon    Psoriatic arthritis (HCC)    PTSD (post-traumatic stress disorder)    Secondary hyperparathyroidism (HCC)    Tardive dyskinesia    Thyroid disease      Social History   Tobacco Use   Smoking status: Never   Smokeless tobacco: Never  Vaping Use   Vaping status: Never Used  Substance Use Topics   Alcohol use: Never   Drug use: Never    Past Surgical History:  Procedure Laterality Date   ABDOMINAL HYSTERECTOMY  1987   CHOLECYSTECTOMY  1979   DILATION AND CURETTAGE OF UTERUS  1973   MASTECTOMY Bilateral 09/19/2014   TONSILLECTOMY  1970   URETERAL REIMPLANTION Bilateral 1974    Family History  Problem Relation Age of Onset   Lymphoma Mother 76   Lymphoma Sister 41   Breast cancer Sister 7   Colon polyps Sister    Lung cancer Sister        former smoker   Stroke Maternal Grandfather    Diabetes Paternal Grandfather    Colon cancer Neg Hx    Esophageal cancer Neg Hx    Stomach cancer Neg Hx    Rectal cancer Neg Hx      Allergies  Allergen Reactions   Aripiprazole Other (See Comments)    Parkinsonism      Lactose Intolerance (Gi) Diarrhea   Methotrexate Other (See Comments)    Hair loss, severe stomatitis     Cefdinir Diarrhea    Other reaction(s): Diarrhea Yeast infection and fever; negative c diff   Etanercept Other (See Comments)    Headaches     Exemestane Other (See Comments)    Suicidal thoughts with medication     Fluoxetine Other (See Comments)    Parkinsonism   Methylprednisolone Sodium Succ Other (See Comments)    Agitated mania   Epinephrine Palpitations    tachycardia    Nitrofurantoin Other (See Comments)     GI upset from 2012    Current Medications:   Current Outpatient Medications:    Acetylcysteine (N-ACETYL CYSTEINE) 600 MG CAPS, 1 capsule Orally Twice a day, Disp: , Rfl:    amLODipine (NORVASC) 5 MG tablet, Take 1 tablet (5 mg total) by mouth daily., Disp: 90 tablet, Rfl: 1   atorvastatin (LIPITOR) 10 MG tablet, TAKE 1 TABLET(10 MG) BY MOUTH DAILY, Disp: 90 tablet, Rfl: 1   augmented betamethasone dipropionate (DIPROLENE-AF) 0.05 % cream, APPLY  TO THE AFFECTED AREAS OF LEG TWICE DAILY AS NEEDED FOR RASH. NOT TO FACE, GROIN, UNDERARMS, Disp: 50 g, Rfl: 0   B Complex Vitamins (VITAMIN B COMPLEX) TABS, 1 tablet Orally Once daily, Disp: , Rfl:    betamethasone dipropionate 0.05 % cream, 1 application Externally Twice a day, Disp: , Rfl:    Cholecalciferol 50 MCG (2000 UT) TABS, 1 tablet Orally Once a day for 30 day(s), Disp: , Rfl:    Golimumab (SIMPONI ARIA IV), Infusion every 8 weeks, Disp: , Rfl:    lamoTRIgine (LAMICTAL) 200 MG tablet, Take 1 tablet (200 mg total) by mouth at bedtime., Disp: 90 tablet, Rfl: 3   levothyroxine (SYNTHROID) 50 MCG tablet, Take 1 tablet (50 mcg total) by mouth daily., Disp: 30 tablet, Rfl: 6   LORazepam (ATIVAN) 1 MG tablet, TAKE 1 TABLET(1 MG) BY MOUTH TWICE DAILY AS NEEDED, Disp: 60 tablet, Rfl: 2   metroNIDAZOLE (METROGEL) 0.75 %  gel, Apply to face 1-2 times daily, Disp: 45 g, Rfl: 1   Omega-3 Fatty Acids (FISH OIL) 1200 MG CAPS, Take 1 capsule by mouth in the morning and at bedtime., Disp: , Rfl:    Probiotic Product (ALIGN) 4 MG CAPS, Take 1 capsule by mouth daily in the afternoon., Disp: , Rfl:    sodium bicarbonate 650 MG tablet, Take 650 mg by mouth 2 (two) times daily., Disp: , Rfl:    traZODone (DESYREL) 50 MG tablet, Take 50 mg by mouth at bedtime as needed for sleep., Disp: , Rfl:    Review of Systems:   ROS  Vitals:   There were no vitals filed for this visit.   There is no height or weight on file to calculate BMI.  Physical Exam:   Physical Exam  Assessment and Plan:   There are no diagnoses linked to this encounter.  I,Emily Lagle,acting as a Neurosurgeon for [employer], [employer].,have documented all relevant documentation on the behalf of Jarold Motto, PA,as directed by  Jarold Motto, PA while in the presence of Jarold Motto, [place].  *** Jarold Motto, PA-C

## 2022-10-28 NOTE — Progress Notes (Unsigned)
Tiffany [name] [id number] 10/17/1950 72 y.o.  Virtual Visit via Video Note  I connected with pt @ on 10/28/22 at  5:20 PM EDT by a video enabled telemedicine application and verified that I am speaking with the correct person using two identifiers.   I discussed the limitations of evaluation and management by telemedicine and the availability of in person appointments. The patient expressed understanding and agreed to proceed.  I discussed the assessment and treatment plan with the patient. The patient was provided an opportunity to ask questions and all were answered. The patient agreed with the plan and demonstrated an understanding of the instructions.   The patient was advised to call back or seek an in-person evaluation if the symptoms worsen or if the condition fails to improve as anticipated.  I provided 25 minutes of non-face-to-face time during this encounter.  The patient was located at home.  The provider was located at [facility].   [name], NP   Subjective:   Patient ID:  Tiffany [name] is a 72 y.o. (DOB Mar 18, 1950) female.  Chief Complaint: No chief complaint on file.   HPI Rosheena Ohora Kaser presents for follow-up of PTSD, insomnia, GAD and BPD- 1.  Describes mood today as "not so good". Pleasant. Reports tearfulness. Mood symptoms - reports rapid cycling. Reports depression, anxiety and irritability. Denies panic attacks. Reports outbursts. Reports feeling super sensitive to movement, sound and sight. Reports worry, rumination, and over thinking. Reports feeling overwhelmed at times. Reports racing thoughts. Stating "things are getting worse". Reports some recent situational stressors contributing to mood symptoms. Mood is variable. Stating "I'm not doing good". Has gotten through the first chapter of BPD work book - plans to stop for now. Feels like medications are helpful. Family supportive. Varying interest and motivation.Taking medications as prescribed.   Energy levels good. Active, exercising. Enjoys some usual interests and activities. Married. Lives with husband and daughter. Talking to family and friends.  Appetite adequate. Weight loss. Sleeps is more variable over the past few days. Slept 4 to 5 hours last night with Ativan. Denies daytime napping. Focus and concentration is "way off". Completing tasks. Managing some aspects of household. Retired.  Reports passive SI. Denies HI.  Denies AH or VH. Denies self harm. Denies substance use.  Previous medications: Celexa, Zyprexa, Tegretol, Depakote, Serzone, Topamax, Seroquel, Effexor, Lexapro, Desipramine, Neurontin, Abilify, Geodon, Propanolol, Cymbalta, Cogentin, Trihexyphenadyl, Sinmmet, Provigil, Selegiline, Requip, Amantadine, Prozac, Mirapex, Azilect, Metoclopramide, Baclofen, Artane, Namenda, Latuda, Lithium,    Review of Systems:  Review of Systems  Musculoskeletal:  Negative for gait problem.  Neurological:  Negative for tremors.  Psychiatric/Behavioral:         Please refer to HPI    Medications: I have reviewed the patient's current medications.  Current Outpatient Medications  Medication Sig Dispense Refill   Acetylcysteine (N-ACETYL CYSTEINE) 600 MG CAPS 1 capsule Orally Twice a day     amLODipine (NORVASC) 5 MG tablet Take 1 tablet (5 mg total) by mouth daily. 90 tablet 1   atorvastatin (LIPITOR) 10 MG tablet TAKE 1 TABLET(10 MG) BY MOUTH DAILY 90 tablet 1   augmented betamethasone dipropionate (DIPROLENE-AF) 0.05 % cream APPLY TO THE AFFECTED AREAS OF LEG TWICE DAILY AS NEEDED FOR RASH. NOT TO FACE, GROIN, UNDERARMS 50 g 0   B Complex Vitamins (VITAMIN B COMPLEX) TABS 1 tablet Orally Once daily     betamethasone dipropionate 0.05 % cream 1 application Externally Twice a day     Cholecalciferol  50 MCG (2000 UT) TABS 1 tablet Orally Once a day for 30 day(s)     Golimumab (SIMPONI ARIA IV) Infusion every 8 weeks     lamoTRIgine (LAMICTAL) 200 MG tablet Take 1 tablet  (200 mg total) by mouth at bedtime. 90 tablet 3   levothyroxine (SYNTHROID) 50 MCG tablet Take 1 tablet (50 mcg total) by mouth daily. 30 tablet 6   LORazepam (ATIVAN) 1 MG tablet TAKE 1 TABLET(1 MG) BY MOUTH TWICE DAILY AS NEEDED 60 tablet 2   metroNIDAZOLE (METROGEL) 0.75 % gel Apply to face 1-2 times daily 45 g 1   Omega-3 Fatty Acids (FISH OIL) 1200 MG CAPS Take 1 capsule by mouth in the morning and at bedtime.     Probiotic Product (ALIGN) 4 MG CAPS Take 1 capsule by mouth daily in the afternoon.     sodium bicarbonate 650 MG tablet Take 650 mg by mouth 2 (two) times daily.     traZODone (DESYREL) 50 MG tablet Take 50 mg by mouth at bedtime as needed for sleep.     No current facility-administered medications for this visit.    Medication Side Effects: None  Allergies:  Allergies  Allergen Reactions   Aripiprazole Other (See Comments)    Parkinsonism      Lactose Intolerance (Gi) Diarrhea   Methotrexate Other (See Comments)    Hair loss, severe stomatitis     Cefdinir Diarrhea    Other reaction(s): Diarrhea Yeast infection and fever; negative c diff   Etanercept Other (See Comments)    Headaches     Exemestane Other (See Comments)    Suicidal thoughts with medication     Fluoxetine Other (See Comments)    Parkinsonism   Methylprednisolone Sodium Succ Other (See Comments)    Agitated mania   Epinephrine Palpitations    tachycardia    Nitrofurantoin Other (See Comments)     GI upset from 2012    Past Medical History:  Diagnosis Date   Bipolar 1 disorder (HCC)    Chronic diarrhea    loose stools twice a day on average for years   CKD stage G4/A1, 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 1.4 cm  Grade I invasive lobular, ER and PR +/Her--, Ki 67 <5% Tried  anastrozole for one month, but developed suicidal idea   Osteoarthritis, knee 09/24/2019   Xray 09/2019   Parkinsonism    Drug induced   Polyposis of colon    Psoriatic arthritis (HCC)    PTSD (post-traumatic stress disorder)    Secondary hyperparathyroidism (HCC)    Tardive dyskinesia    Thyroid disease     Family History  Problem Relation Age of Onset   Lymphoma Mother 49   Lymphoma Sister 37   Breast cancer Sister 25   Colon polyps Sister    Lung cancer Sister        former smoker   Stroke Maternal Grandfather    Diabetes Paternal Grandfather    Colon cancer Neg Hx    Esophageal cancer Neg Hx    Stomach cancer Neg Hx    Rectal cancer Neg Hx     Social History   Socioeconomic History   Marital status: Married    Spouse name: Koren Bound   Number of children: 3  Years of education: Not on file   Highest education level: Not on file  Occupational History   Occupation: retired  Tobacco Use   Smoking status: Never   Smokeless tobacco: Never  Vaping Use   Vaping status: Never Used  Substance and Sexual Activity   Alcohol use: Never   Drug use: Never   Sexual activity: Not Currently  Other Topics Concern   Not on file  Social History Narrative   Moved to area from [place] 08/2018   Lives one story home   Right handed.   Social Determinants of Health   Financial Resource Strain: Low Risk  (10/28/2022)   Overall Financial Resource Strain (CARDIA)    Difficulty of Paying Living Expenses: Not hard at all  Food Insecurity: No Food Insecurity (10/28/2022)   Hunger Vital Sign    Worried About Running Out of Food in the Last Year: Never true    Ran Out of Food in the Last Year: Never true  Transportation Needs: No Transportation Needs (10/28/2022)   PRAPARE - Administrator, Civil Service (Medical): No    Lack of Transportation (Non-Medical): No  Physical Activity: Insufficiently Active (10/28/2022)   Exercise Vital Sign    Days of Exercise per Week: 2  days    Minutes of Exercise per Session: 40 min  Stress: Stress Concern Present (10/28/2022)   [employer] of Occupational Health - Occupational Stress Questionnaire    Feeling of Stress : Rather much  Social Connections: Socially Integrated (10/28/2022)   Social Connection and Isolation Panel [NHANES]    Frequency of Communication with Friends and Family: Three times a week    Frequency of Social Gatherings with Friends and Family: More than three times a week    Attends Religious Services: More than 4 times per year    Active Member of [employer] or Organizations: Yes    Attends Banker Meetings: More than 4 times per year    Marital Status: Married  Catering manager Violence: Not At Risk (04/15/2022)   Humiliation, Afraid, Rape, and Kick questionnaire    Fear of Current or Ex-Partner: No    Emotionally Abused: No    Physically Abused: No    Sexually Abused: No    Past Medical History, Surgical history, Social history, and Family history were reviewed and updated as appropriate.   Please see review of systems for further details on the patient's review from today.   Objective:   Physical Exam:  There were no vitals taken for this visit.  Physical Exam Constitutional:      General: She is not in acute distress. Musculoskeletal:        General: No deformity.  Neurological:     Mental Status: She is alert and oriented to person, place, and time.     Coordination: Coordination normal.  Psychiatric:        Attention and Perception: Attention and perception normal. She does not perceive auditory or visual hallucinations.        Mood and Affect: Affect is not labile, blunt, angry or inappropriate.        Speech: Speech normal.        Behavior: Behavior normal.        Thought Content: Thought content normal. Thought content is not paranoid or delusional. Thought content does not include homicidal or suicidal ideation. Thought content does not include homicidal or  suicidal plan.        Cognition and Memory: Cognition and  memory normal.        Judgment: Judgment normal.     Comments: Insight intact     Lab Review:     Component Value Date/Time   NA 141 07/12/2022 1108   NA 142 10/17/2021 0000   K 4.3 07/12/2022 1108   CL 105 07/12/2022 1108   CO2 26 07/12/2022 1108   GLUCOSE 69 (L) 07/12/2022 1108   BUN 32 (H) 07/12/2022 1108   BUN 30 (A) 10/17/2021 0000   CREATININE 2.27 (H) 07/12/2022 1108   CREATININE 2.29 (H) 05/22/2021 1044   CALCIUM 8.9 07/19/2022 1100   PROT 7.1 07/15/2022 1515   ALBUMIN 4.4 07/15/2022 1515   AST 25 07/15/2022 1515   ALT 29 07/15/2022 1515   ALKPHOS 138 (H) 07/15/2022 1515   BILITOT 0.9 07/15/2022 1515   GFRNONAA 22 (L) 04/03/2022 1425   GFRAA 25 (L) 09/13/2019 1243       Component Value Date/Time   WBC 7.7 07/12/2022 1108   RBC 4.18 07/12/2022 1108   HGB 12.9 07/12/2022 1108   HCT 38.6 07/12/2022 1108   PLT 201.0 07/12/2022 1108   MCV 92.3 07/12/2022 1108   MCH 30.5 04/03/2022 1425   MCHC 33.4 07/12/2022 1108   RDW 13.5 07/12/2022 1108   LYMPHSABS 3.4 07/12/2022 1108   MONOABS 0.9 07/12/2022 1108   EOSABS 0.3 07/12/2022 1108   BASOSABS 0.1 07/12/2022 1108    Lithium Lvl  Date Value Ref Range Status  05/22/2021 0.7 0.6 - 1.2 mmol/L Final     No results found for: "PHENYTOIN", "PHENOBARB", "VALPROATE", "CBMZ"   .res Assessment: Plan:    Plan:  Lorazepam 1mg  BID for anxiety - may take one tablet extra for severe anxiety symptoms. Taking one at night routinely.  Lamictal 200mg  hs.  Trazadone 50mg  as needed  NAC tablets BID - for obsessive thoughts, worry, and rumination - unable to take SSRI's  RTC weekly  Counseled patient regarding potential benefits, risks, and side effects of Lamictal to include potential risk of Stevens-Johnson syndrome. Advised patient to stop taking Lamictal and contact office immediately if rash develops and to seek urgent medical attention if rash is severe  and/or spreading quickly.  There are no diagnoses linked to this encounter.   Please see After Visit Summary for patient specific instructions.  Future Appointments  Date Time Provider Department Center  10/28/2022  5:20 PM Shantae Vantol, Thereasa Solo, NP [facility] None  10/29/2022 10:40 AM Jarold Motto, PA [facility]  10/29/2022  1:20 PM Pricilla Riffle, MD [facility]  10/30/2022  1:00 PM Blair Lundeen, Thereasa Solo, NP [facility] None  11/06/2022 11:20 AM Jeremiah Tarpley, Thereasa Solo, NP [facility] None  11/12/2022 11:50 AM Shamleffer, Konrad Dolores, MD [facility] None  11/14/2022 11:30 AM Van Clines, MD [facility] None  11/15/2022  1:20 PM Macarena Langseth, Thereasa Solo, NP [facility] None  11/22/2022  1:00 PM Zeinab Rodwell, Thereasa Solo, NP [facility] None  04/24/2023  2:00 PM [facility] ANNUAL WELLNESS VISIT 1 [facility]    No orders of the defined types were placed in this encounter.     -------------------------------

## 2022-10-28 NOTE — Progress Notes (Unsigned)
Cardiology Office Note   Date:  10/29/2022   ID:  Tiffany, [name] May 21, 1950, MRN [id number]  PCP:  Tiffany Motto, PA  Cardiologist:   Tiffany Pates, MD   Pt referred by Tiffany [name] for SOB       History of Present Illness: Tiffany [name] is a 72 y.o. female with a history of bipolar disorder, CKD, tardive dyskinesia who is referred for SOB   Pt follows with Tiffany [name]   Has had 2 episodes of SOB   The last episode she was starting out a workout at the gym   Just started, says she wasn't doing much   Got VERY SOB No diaphoresis  Arms went weak, could not carry purse   Pt went to ER     labs, EKG negative    Did have difficulty findings words at the time Since then she has had no further spells  Went back to gym on 1/31  Working mainly with  weights      She denies severe CP  No palpitations   No PND    No LE edema  I saw the pt in Feb 2024   The pt had an echo which was normal   Cardiac monitor showed no significant arrhythmias   She went on to a myoview scan   Perfusion normal  TID was 1.22  Since seen she get some SOB  (3 flights of stairs, erratic at other times, not always with ctivity)    She denies significant CP.  She works out in the pool with water exercises  Experiences no SOB or CP when doing these exercises  Still has some palpitations   last minutes  Come a couple times per week   No dizzienss    Current Meds  Medication Sig   Acetylcysteine (N-ACETYL CYSTEINE) 600 MG CAPS 1 capsule Orally Twice a day   amLODipine (NORVASC) 5 MG tablet Take 1 tablet (5 mg total) by mouth daily.   atorvastatin (LIPITOR) 10 MG tablet TAKE 1 TABLET(10 MG) BY MOUTH DAILY   augmented betamethasone dipropionate (DIPROLENE-AF) 0.05 % cream APPLY TO THE AFFECTED AREAS OF LEG TWICE DAILY AS NEEDED FOR RASH. NOT TO FACE, GROIN, UNDERARMS   B Complex Vitamins (VITAMIN B COMPLEX) TABS 1 tablet Orally Once daily   betamethasone dipropionate 0.05 % cream 1 application Externally Twice a day    Cholecalciferol 50 MCG (2000 UT) TABS 1 tablet Orally Once a day for 30 day(s)   Golimumab (SIMPONI ARIA IV) Infusion every 8 weeks   ipratropium (ATROVENT) 0.03 % nasal spray Place 2 sprays into both nostrils every 12 (twelve) hours.   lamoTRIgine (LAMICTAL) 200 MG tablet Take 1 tablet (200 mg total) by mouth at bedtime.   levothyroxine (SYNTHROID) 50 MCG tablet Take 1 tablet (50 mcg total) by mouth daily.   LORazepam (ATIVAN) 1 MG tablet TAKE 1 TABLET(1 MG) BY MOUTH TWICE DAILY AS NEEDED   metroNIDAZOLE (METROGEL) 0.75 % gel Apply to face 1-2 times daily   Omega-3 Fatty Acids (FISH OIL) 1200 MG CAPS Take 1 capsule by mouth in the morning and at bedtime.   Probiotic Product (ALIGN) 4 MG CAPS Take 1 capsule by mouth daily in the afternoon.   sodium bicarbonate 650 MG tablet Take 650 mg by mouth 2 (two) times daily.   traZODone (DESYREL) 50 MG tablet Take 50 mg by mouth at bedtime as needed for sleep.     Allergies:   Aripiprazole, Lactose  intolerance (gi), Methotrexate, Cefdinir, Etanercept, Exemestane, Fluoxetine, Methylprednisolone sodium succ, Epinephrine, and Nitrofurantoin   Past Medical History:  Diagnosis Date   Bipolar 1 disorder (HCC)    Chronic diarrhea    loose stools twice a day on average for years   CKD stage G4/A1, 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 1.4 cm  Grade I invasive lobular, ER and PR +/Her--, Ki 67 <5% Tried anastrozole for one month, but developed suicidal idea   Osteoarthritis, knee 09/24/2019   Xray 09/2019   Parkinsonism    Drug induced   Polyposis of colon    Psoriatic arthritis (HCC)    PTSD (post-traumatic stress disorder)    Secondary hyperparathyroidism (HCC)    Tardive dyskinesia    Thyroid disease     Past Surgical History:  Procedure  Laterality Date   ABDOMINAL HYSTERECTOMY  1987   CHOLECYSTECTOMY  1979   DILATION AND CURETTAGE OF UTERUS  1973   MASTECTOMY Bilateral 09/19/2014   TONSILLECTOMY  1970   URETERAL REIMPLANTION Bilateral 1974     Social History:  The patient  reports that she has never smoked. She has never used smokeless tobacco. She reports that she does not drink alcohol and does not use drugs.   Family History:  The patient's family history includes Breast cancer (age of onset: 64) in her sister; Colon polyps in her sister; Diabetes in her paternal grandfather; Lung cancer in her sister; Lymphoma (age of onset: 4) in her sister; Lymphoma (age of onset: 36) in her mother; Stroke in her maternal grandfather.    ROS:  Please see the history of present illness. All other systems are reviewed and  Negative to the above problem except as noted.    PHYSICAL EXAM: VS:  BP 138/80   Pulse 60   Ht 5\' 4"  (1.626 m)   Wt 181 lb 9.6 oz (82.4 kg)   SpO2 96%   BMI 31.17 kg/m   GEN: Well nourished, well developed, in no acute distress  HEENT: normal  Neck: no JVD,,  Cardiac: RRR; no murmur  No LE edema  Respiratory:  clear to auscultation  GI: soft, nontender No hepatomegaly  MS: no deformity Moving all extremities     EKG:  EKG is not ordered today.  NSR 63 bpm     Lipid Panel    Component Value Date/Time   CHOL 182 12/10/2021 1044   TRIG 89.0 12/10/2021 1044   HDL 59.90 12/10/2021 1044   CHOLHDL 3 12/10/2021 1044   VLDL 17.8 12/10/2021 1044   LDLCALC 105 (H) 12/10/2021 1044      Wt Readings from Last 3 Encounters:  10/29/22 181 lb 9.6 oz (82.4 kg)  10/29/22 180 lb (81.6 kg)  07/30/22 183 lb (83 kg)      ASSESSMENT AND PLAN:  1  Dypsnea  Pt with 2 spells of SOB that were new   One was severe   None since  Assoicated with some arm numbness and weakness  Work up is overall good  She did have borderline TID (1.22)  Given symtoms I am not convinced she should proceed with cath   Follow       2  HL On atorvastatin  Will get lipids today   3 HTN   PT  says BP is better at home   Follow   4  Palpittions   Self lmited   Few times per week   Will try Rx 12.5 Toprol XL  5 Renal  Cr 2.3  Follows at [facility] in nephrology     6  Metabolics   Check A1C today   Stay active    Follow up in spring      Current medicines are reviewed at length with the patient today.  The patient does not have concerns regarding medicines.  Signed, Tiffany Pates, MD  10/29/2022 1:40 PM    [facility] [address] [place], [place], [place]  [address] Phone: [phone]; Fax: [phone]

## 2022-10-29 ENCOUNTER — Ambulatory Visit: Payer: Medicare Other | Attending: Internal Medicine | Admitting: Internal Medicine

## 2022-10-29 ENCOUNTER — Encounter: Payer: Self-pay | Admitting: Internal Medicine

## 2022-10-29 ENCOUNTER — Ambulatory Visit: Payer: Medicare Other | Admitting: Physician Assistant

## 2022-10-29 ENCOUNTER — Encounter: Payer: Self-pay | Admitting: Physician Assistant

## 2022-10-29 VITALS — BP 130/70 | HR 59 | Temp 97.8°F | Ht 64.0 in | Wt 180.0 lb

## 2022-10-29 VITALS — BP 138/80 | HR 60 | Ht 64.0 in | Wt 181.6 lb

## 2022-10-29 DIAGNOSIS — Z131 Encounter for screening for diabetes mellitus: Secondary | ICD-10-CM | POA: Diagnosis present

## 2022-10-29 DIAGNOSIS — R059 Cough, unspecified: Secondary | ICD-10-CM | POA: Diagnosis not present

## 2022-10-29 DIAGNOSIS — R2689 Other abnormalities of gait and mobility: Secondary | ICD-10-CM | POA: Diagnosis not present

## 2022-10-29 DIAGNOSIS — R0602 Shortness of breath: Secondary | ICD-10-CM | POA: Insufficient documentation

## 2022-10-29 DIAGNOSIS — M25561 Pain in right knee: Secondary | ICD-10-CM | POA: Diagnosis not present

## 2022-10-29 DIAGNOSIS — R0609 Other forms of dyspnea: Secondary | ICD-10-CM | POA: Diagnosis not present

## 2022-10-29 DIAGNOSIS — R0600 Dyspnea, unspecified: Secondary | ICD-10-CM | POA: Diagnosis not present

## 2022-10-29 DIAGNOSIS — R002 Palpitations: Secondary | ICD-10-CM | POA: Insufficient documentation

## 2022-10-29 DIAGNOSIS — R110 Nausea: Secondary | ICD-10-CM

## 2022-10-29 DIAGNOSIS — R42 Dizziness and giddiness: Secondary | ICD-10-CM

## 2022-10-29 MED ORDER — IPRATROPIUM BROMIDE 0.03 % NA SOLN: 2.0000 | Freq: Two times a day (BID) | NASAL | 12 refills | Status: AC

## 2022-10-29 MED ORDER — METOPROLOL SUCCINATE ER 25 MG PO TB24: 12.5000 mg | ORAL_TABLET | Freq: Every day | ORAL | 3 refills | Status: DC

## 2022-10-29 NOTE — Patient Instructions (Signed)
It was great to see you!  Referral to physical therapy placed  Trial the nasal spray twice daily  If no improvement in cough/post nasal drip/nausea - may add in OTC (available over the counter without a prescription) 20 mg omeprazole  Follow-up if symptom(s) worsen or do not improve  Try the vertigo exercises at home and let me know if you would like vestibular rehab to help with this  Take care,  Jarold Motto PA-C

## 2022-10-29 NOTE — Patient Instructions (Addendum)
Medication Instructions:  Take Toprol XL 25 mg 1/2 tablet daily  *If you need a refill on your cardiac medications before your next appointment, please call your pharmacy*   Lab Work: NMR, HGBA1C If you have labs (blood work) drawn today and your tests are completely normal, you will receive your results only by: MyChart Message (if you have MyChart) OR A paper copy in the mail If you have any lab test that is abnormal or we need to change your treatment, we will call you to review the results.   Testing/Procedures:    Follow-Up: At [facility], you and your health needs are our priority.  As part of our continuing mission to provide you with exceptional heart care, we have created designated Provider Care Teams.  These Care Teams include your primary Cardiologist (physician) and Advanced Practice Providers (APPs -  Physician Assistants and Nurse Practitioners) who all work together to provide you with the care you need, when you need it.  We recommend signing up for the patient portal called "MyChart".  Sign up information is provided on this After Visit Summary.  MyChart is used to connect with patients for Virtual Visits (Telemedicine).  Patients are able to view lab/test results, encounter notes, upcoming appointments, etc.  Non-urgent messages can be sent to your provider as well.   To learn more about what you can do with MyChart, go to [email or web address].    Your next appointment:   DR Dietrich Pates      Other Instructions

## 2022-10-30 ENCOUNTER — Other Ambulatory Visit: Payer: Self-pay

## 2022-10-30 ENCOUNTER — Telehealth (INDEPENDENT_AMBULATORY_CARE_PROVIDER_SITE_OTHER): Payer: Medicare Other | Admitting: Adult Health

## 2022-10-30 ENCOUNTER — Encounter: Payer: Self-pay | Admitting: Adult Health

## 2022-10-30 DIAGNOSIS — F4310 Post-traumatic stress disorder, unspecified: Secondary | ICD-10-CM

## 2022-10-30 DIAGNOSIS — F319 Bipolar disorder, unspecified: Secondary | ICD-10-CM

## 2022-10-30 DIAGNOSIS — F5105 Insomnia due to other mental disorder: Secondary | ICD-10-CM

## 2022-10-30 DIAGNOSIS — F411 Generalized anxiety disorder: Secondary | ICD-10-CM | POA: Diagnosis not present

## 2022-10-30 DIAGNOSIS — F99 Mental disorder, not otherwise specified: Secondary | ICD-10-CM

## 2022-10-30 LAB — NMR, LIPOPROFILE
Cholesterol, Total: 144 mg/dL (ref 100–199)
HDL Particle Number: 27.7 umol/L — ABNORMAL LOW (ref >=30.5)
HDL-C: 73 mg/dL (ref >39)
LDL Particle Number: 511 nmol/L (ref <1000)
LDL Size: 21 nm (ref >20.5)
LDL-C (NIH Calc): 57 mg/dL (ref 0–99)
LP-IR Score: <25 (ref <=45)
Small LDL Particle Number: <90 nmol/L (ref <=527)
Triglycerides: 74 mg/dL (ref 0–149)

## 2022-10-30 LAB — HEMOGLOBIN A1C
Est. average glucose Bld gHb Est-mCnc: 103 mg/dL
Hgb A1c MFr Bld: 5.2 % (ref 4.8–5.6)

## 2022-10-30 MED ORDER — TRAZODONE HCL 50 MG PO TABS: 50.0000 mg | ORAL_TABLET | Freq: Every evening | ORAL | 1 refills | Status: DC | PRN

## 2022-10-30 NOTE — Progress Notes (Signed)
Tiffany [name] [id number] 03/26/1950 72 y.o.  Virtual Visit via Video Note  I connected with pt @ on 10/30/22 at  1:00 PM EDT by a video enabled telemedicine application and verified that I am speaking with the correct person using two identifiers.   I discussed the limitations of evaluation and management by telemedicine and the availability of in person appointments. The patient expressed understanding and agreed to proceed.  I discussed the assessment and treatment plan with the patient. The patient was provided an opportunity to ask questions and all were answered. The patient agreed with the plan and demonstrated an understanding of the instructions.   The patient was advised to call back or seek an in-person evaluation if the symptoms worsen or if the condition fails to improve as anticipated.  I provided 25 minutes of non-face-to-face time during this encounter.  The patient was located at home.  The provider was located at [facility].   Tiffany Gibbs, NP   Subjective:   Patient ID:  Tiffany [name] is a 72 y.o. (DOB Nov 15, 1950) female.  Chief Complaint: No chief complaint on file.   HPI Tiffany [name] presents for follow-up of PTSD, insomnia, GAD and BPD- 1.  Describes mood today as "about the same". Pleasant. Reports tearfulness. Mood symptoms - reports rapid cycling - having highs and lows over past few days. Reports depression, anxiety and irritability. Denies panic attacks. Reports recent outbursts. Reports feeling overwhelmed. Reports increased sensitivity to external influences. Reports an increase in fear level. Reports worry, rumination, and over thinking. Feeling overwhelmed - difficulties coping with things. Reports racing thoughts. Mood is variable. Stating "everything seems overwhelming right now." Feels like medications are helpful. Family supportive. Varying interest and motivation. Taking medications as prescribed.  Energy levels good. Active, exercising  some. Enjoys some usual interests and activities. Married. Lives with husband and daughter. Talking to family and friends.  Appetite adequate. Weight loss. Sleeps better some nights than others. Reports broken sleep. Denies daytime napping. Reports focus and concentration difficulties. Completing tasks. Managing some aspects of household. Retired.  Denies SI. Denies HI.  Denies AH or VH. Denies self harm. Denies substance use.  Previous medications: Celexa, Zyprexa, Tegretol, Depakote, Serzone, Topamax, Seroquel, Effexor, Lexapro, Desipramine, Neurontin, Abilify, Geodon, Propanolol, Cymbalta, Cogentin, Trihexyphenadyl, Sinmmet, Provigil, Selegiline, Requip, Amantadine, Prozac, Mirapex, Azilect, Metoclopramide, Baclofen, Artane, Namenda, Latuda, Lithium,   Review of Systems:  Review of Systems  Musculoskeletal:  Negative for gait problem.  Neurological:  Negative for tremors.  Psychiatric/Behavioral:         Please refer to HPI    Medications: I have reviewed the patient's current medications.  Current Outpatient Medications  Medication Sig Dispense Refill   Acetylcysteine (N-ACETYL CYSTEINE) 600 MG CAPS 1 capsule Orally Twice a day     amLODipine (NORVASC) 5 MG tablet Take 1 tablet (5 mg total) by mouth daily. 90 tablet 1   atorvastatin (LIPITOR) 10 MG tablet TAKE 1 TABLET(10 MG) BY MOUTH DAILY 90 tablet 1   augmented betamethasone dipropionate (DIPROLENE-AF) 0.05 % cream APPLY TO THE AFFECTED AREAS OF LEG TWICE DAILY AS NEEDED FOR RASH. NOT TO FACE, GROIN, UNDERARMS 50 g 0   B Complex Vitamins (VITAMIN B COMPLEX) TABS 1 tablet Orally Once daily     betamethasone dipropionate 0.05 % cream 1 application Externally Twice a day     Cholecalciferol 50 MCG (2000 UT) TABS 1 tablet Orally Once a day for 30 day(s)     Golimumab (SIMPONI ARIA  IV) Infusion every 8 weeks     ipratropium (ATROVENT) 0.03 % nasal spray Place 2 sprays into both nostrils every 12 (twelve) hours. 30 mL 12    lamoTRIgine (LAMICTAL) 200 MG tablet Take 1 tablet (200 mg total) by mouth at bedtime. 90 tablet 3   levothyroxine (SYNTHROID) 50 MCG tablet Take 1 tablet (50 mcg total) by mouth daily. 30 tablet 6   LORazepam (ATIVAN) 1 MG tablet TAKE 1 TABLET(1 MG) BY MOUTH TWICE DAILY AS NEEDED 60 tablet 2   metoprolol succinate (TOPROL XL) 25 MG 24 hr tablet Take 0.5 tablets (12.5 mg total) by mouth daily. 45 tablet 3   metroNIDAZOLE (METROGEL) 0.75 % gel Apply to face 1-2 times daily 45 g 1   Omega-3 Fatty Acids (FISH OIL) 1200 MG CAPS Take 1 capsule by mouth in the morning and at bedtime.     Probiotic Product (ALIGN) 4 MG CAPS Take 1 capsule by mouth daily in the afternoon.     sodium bicarbonate 650 MG tablet Take 650 mg by mouth 2 (two) times daily.     traZODone (DESYREL) 50 MG tablet Take 1 tablet (50 mg total) by mouth at bedtime as needed for sleep. 60 tablet 1   No current facility-administered medications for this visit.    Medication Side Effects: None  Allergies:  Allergies  Allergen Reactions   Aripiprazole Other (See Comments)    Parkinsonism      Lactose Intolerance (Gi) Diarrhea   Methotrexate Other (See Comments)    Hair loss, severe stomatitis     Cefdinir Diarrhea    Other reaction(s): Diarrhea Yeast infection and fever; negative c diff   Etanercept Other (See Comments)    Headaches     Exemestane Other (See Comments)    Suicidal thoughts with medication     Fluoxetine Other (See Comments)    Parkinsonism   Methylprednisolone Sodium Succ Other (See Comments)    Agitated mania   Epinephrine Palpitations    tachycardia    Nitrofurantoin Other (See Comments)     GI upset from 2012    Past Medical History:  Diagnosis Date   Bipolar 1 disorder (HCC)    Chronic diarrhea    loose stools twice a day on average for years   CKD stage G4/A1, 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 1.4 cm  Grade I invasive lobular, ER and PR +/Her--, Ki 67 <5% Tried anastrozole for one month, but developed suicidal idea   Osteoarthritis, knee 09/24/2019   Xray 09/2019   Parkinsonism    Drug induced   Polyposis of colon    Psoriatic arthritis (HCC)    PTSD (post-traumatic stress disorder)    Secondary hyperparathyroidism (HCC)    Tardive dyskinesia    Thyroid disease     Family History  Problem Relation Age of Onset   Lymphoma Mother 18   Lymphoma Sister 3   Breast cancer Sister 69   Colon polyps Sister    Lung cancer Sister        former smoker   Stroke Maternal Grandfather    Diabetes Paternal Grandfather    Colon cancer Neg Hx    Esophageal cancer Neg Hx    Stomach cancer Neg Hx    Rectal cancer Neg Hx  Social History   Socioeconomic History   Marital status: Married    Spouse name: Koren Bound   Number of children: 3   Years of education: Not on file   Highest education level: Not on file  Occupational History   Occupation: retired  Tobacco Use   Smoking status: Never   Smokeless tobacco: Never  Vaping Use   Vaping status: Never Used  Substance and Sexual Activity   Alcohol use: Never   Drug use: Never   Sexual activity: Not Currently  Other Topics Concern   Not on file  Social History Narrative   Moved to area from [place] 08/2018   Lives one story home   Right handed.   Social Determinants of Health   Financial Resource Strain: Low Risk  (10/28/2022)   Overall Financial Resource Strain (CARDIA)    Difficulty of Paying Living Expenses: Not hard at all  Food Insecurity: No Food Insecurity (10/28/2022)   Hunger Vital Sign    Worried About Running Out of Food in the Last Year: Never true    Ran Out of Food in the Last Year: Never true  Transportation Needs: No Transportation Needs (10/28/2022)   PRAPARE - Therapist, art (Medical): No    Lack of Transportation (Non-Medical): No  Physical Activity: Insufficiently Active (10/28/2022)   Exercise Vital Sign    Days of Exercise per Week: 2 days    Minutes of Exercise per Session: 40 min  Stress: Stress Concern Present (10/28/2022)   [employer] of Occupational Health - Occupational Stress Questionnaire    Feeling of Stress : Rather much  Social Connections: Socially Integrated (10/28/2022)   Social Connection and Isolation Panel [NHANES]    Frequency of Communication with Friends and Family: Three times a week    Frequency of Social Gatherings with Friends and Family: More than three times a week    Attends Religious Services: More than 4 times per year    Active Member of [employer] or Organizations: Yes    Attends Banker Meetings: More than 4 times per year    Marital Status: Married  Catering manager Violence: Not At Risk (04/15/2022)   Humiliation, Afraid, Rape, and Kick questionnaire    Fear of Current or Ex-Partner: No    Emotionally Abused: No    Physically Abused: No    Sexually Abused: No    Past Medical History, Surgical history, Social history, and Family history were reviewed and updated as appropriate.   Please see review of systems for further details on the patient's review from today.   Objective:   Physical Exam:  There were no vitals taken for this visit.  Physical Exam Constitutional:      General: She is not in acute distress. Musculoskeletal:        General: No deformity.  Neurological:     Mental Status: She is alert and oriented to person, place, and time.     Coordination: Coordination normal.  Psychiatric:        Attention and Perception: Attention and perception normal. She does not perceive auditory or visual hallucinations.        Mood and Affect: Mood normal. Mood is not anxious or depressed. Affect is not labile, blunt, angry or inappropriate.        Speech: Speech normal.         Behavior: Behavior normal.        Thought Content: Thought content normal. Thought content is not  paranoid or delusional. Thought content does not include homicidal or suicidal ideation. Thought content does not include homicidal or suicidal plan.        Cognition and Memory: Cognition and memory normal.        Judgment: Judgment normal.     Comments: Insight intact     Lab Review:     Component Value Date/Time   NA 141 07/12/2022 1108   NA 142 10/17/2021 0000   K 4.3 07/12/2022 1108   CL 105 07/12/2022 1108   CO2 26 07/12/2022 1108   GLUCOSE 69 (L) 07/12/2022 1108   BUN 32 (H) 07/12/2022 1108   BUN 30 (A) 10/17/2021 0000   CREATININE 2.27 (H) 07/12/2022 1108   CREATININE 2.29 (H) 05/22/2021 1044   CALCIUM 8.9 07/19/2022 1100   PROT 7.1 07/15/2022 1515   ALBUMIN 4.4 07/15/2022 1515   AST 25 07/15/2022 1515   ALT 29 07/15/2022 1515   ALKPHOS 138 (H) 07/15/2022 1515   BILITOT 0.9 07/15/2022 1515   GFRNONAA 22 (L) 04/03/2022 1425   GFRAA 25 (L) 09/13/2019 1243       Component Value Date/Time   WBC 7.7 07/12/2022 1108   RBC 4.18 07/12/2022 1108   HGB 12.9 07/12/2022 1108   HCT 38.6 07/12/2022 1108   PLT 201.0 07/12/2022 1108   MCV 92.3 07/12/2022 1108   MCH 30.5 04/03/2022 1425   MCHC 33.4 07/12/2022 1108   RDW 13.5 07/12/2022 1108   LYMPHSABS 3.4 07/12/2022 1108   MONOABS 0.9 07/12/2022 1108   EOSABS 0.3 07/12/2022 1108   BASOSABS 0.1 07/12/2022 1108    Lithium Lvl  Date Value Ref Range Status  05/22/2021 0.7 0.6 - 1.2 mmol/L Final     No results found for: "PHENYTOIN", "PHENOBARB", "VALPROATE", "CBMZ"   .res Assessment: Plan:    Plan:  Lorazepam 1mg  BID for anxiety - may take one tablet extra for severe anxiety symptoms. Taking one at night routinely.  Lamictal 200mg  hs.  Trazadone 50mg  as needed  NAC tablets BID - for obsessive thoughts, worry, and rumination - unable to take SSRI's  RTC weekly  Counseled patient regarding potential benefits,  risks, and side effects of Lamictal to include potential risk of Stevens-Johnson syndrome. Advised patient to stop taking Lamictal and contact office immediately if rash develops and to seek urgent medical attention if rash is severe and/or spreading quickly.  There are no diagnoses linked to this encounter.   Please see After Visit Summary for patient specific instructions.  Future Appointments  Date Time Provider Department Center  10/30/2022  1:00 PM Braulio Kiedrowski, Thereasa Solo, NP [facility] None  11/06/2022 11:20 AM Evin Chirco, Thereasa Solo, NP [facility] None  11/12/2022 11:50 AM Shamleffer, Konrad Dolores, MD [facility] None  11/14/2022 11:30 AM Van Clines, MD [facility] None  11/15/2022  1:20 PM Thalya Fouche, Thereasa Solo, NP [facility] None  11/22/2022  1:00 PM Chaney Maclaren, Thereasa Solo, NP [facility] None  12/05/2022  1:45 PM Dessie Coma, PT [facility]  04/24/2023  2:00 PM [facility] ANNUAL WELLNESS VISIT 1 [facility]    No orders of the defined types were placed in this encounter.     -------------------------------

## 2022-11-03 ENCOUNTER — Telehealth: Payer: Self-pay | Admitting: Internal Medicine

## 2022-11-03 MED ORDER — LEVOTHYROXINE SODIUM 50 MCG PO TABS: 50.0000 ug | ORAL_TABLET | ORAL | 2 refills | Status: DC

## 2022-11-03 NOTE — Telephone Encounter (Signed)
Please let the pt know that her thyroid remains within normal range but we have room to slightly increase levothyroxine   Take Two tabs of Levothroxine on Sundays and 1 tab rest of the week    Thanks

## 2022-11-04 NOTE — Telephone Encounter (Signed)
Patient spouse advised and verbalized understanding

## 2022-11-06 ENCOUNTER — Ambulatory Visit: Payer: Medicare Other | Admitting: Adult Health

## 2022-11-06 ENCOUNTER — Telehealth (INDEPENDENT_AMBULATORY_CARE_PROVIDER_SITE_OTHER): Payer: Medicare Other | Admitting: Adult Health

## 2022-11-06 ENCOUNTER — Encounter: Payer: Self-pay | Admitting: Adult Health

## 2022-11-06 DIAGNOSIS — F5105 Insomnia due to other mental disorder: Secondary | ICD-10-CM

## 2022-11-06 DIAGNOSIS — F411 Generalized anxiety disorder: Secondary | ICD-10-CM | POA: Diagnosis not present

## 2022-11-06 DIAGNOSIS — F319 Bipolar disorder, unspecified: Secondary | ICD-10-CM | POA: Diagnosis not present

## 2022-11-06 DIAGNOSIS — F99 Mental disorder, not otherwise specified: Secondary | ICD-10-CM

## 2022-11-06 DIAGNOSIS — F4310 Post-traumatic stress disorder, unspecified: Secondary | ICD-10-CM

## 2022-11-06 NOTE — Progress Notes (Signed)
Tiffany [name] [id number] [name] 14, 1951 72 y.o.  Virtual Visit via Video Note  I connected with pt @ on 11/06/22 at 11:20 AM EDT by a video enabled telemedicine application and verified that I am speaking with the correct person using two identifiers.   I discussed the limitations of evaluation and management by telemedicine and the availability of in person appointments. The patient expressed understanding and agreed to proceed.  I discussed the assessment and treatment plan with the patient. The patient was provided an opportunity to ask questions and all were answered. The patient agreed with the plan and demonstrated an understanding of the instructions.   The patient was advised to call back or seek an in-person evaluation if the symptoms worsen or if the condition fails to improve as anticipated.  I provided 25 minutes of non-face-to-face time during this encounter.  The patient was located at home.  The provider was located at [facility].   Tiffany Gibbs, NP   Subjective:   Patient ID:  Tiffany [name] is a 72 y.o. (DOB 1950-08-20) female.  Chief Complaint: No chief complaint on file.   HPI Tiffany [name] presents for follow-up of PTSD, insomnia, GAD and BPD- 1.  Describes mood today as "not any better". Pleasant. Reports tearfulness. Mood symptoms - reports rapid cycling - reports hypomanic symptoms. Wrote a suicide note to husband and daughter over the weekend. Reports telling husband and daughter how she was feeling. Reports moments of feeling better, then not - "unpredictable". Reports worry, rumination, and over thinking. Having "hopeful" thoughts. Reports severe depression. Reports a "nose dive" since last visit. Reports irritability at times - "more snappy". Reports increased anxiety at times. Denies panic attacks. Denies recent outbursts. Reports feeling overwhelmed. Reports racing thoughts. Mood is variable. Stating "I'm not doing good". Reports a recent change to  thyroid medication and is hopeful it will help mood. Family supportive. Varying interest and motivation. Taking medications as prescribed.  Energy levels good. Active, exercising some - going to the pool. Enjoys some usual interests and activities. Married. Lives with husband and daughter. Talking to family and friends.  Appetite adequate - "cycling". Weight loss -3 pounds. Sleeps better some nights than others - "not as good lately". Reports broken sleep. Denies daytime napping. Reports focus and concentration difficulties. Completing tasks. Managing some aspects of household. Retired.  Reports passive SI. Denies any intent. Discussed with family.  Denies HI.  Denies AH or VH. Denies self harm. Denies substance use. Recent lab work - reports kidney function improvement.  Previous medications: Celexa, Zyprexa, Tegretol, Depakote, Serzone, Topamax, Seroquel, Effexor, Lexapro, Desipramine, Neurontin, Abilify, Geodon, Propanolol, Cymbalta, Cogentin, Trihexyphenadyl, Sinmmet, Provigil, Selegiline, Requip, Amantadine, Prozac, Mirapex, Azilect, Metoclopramide, Baclofen, Artane, Namenda, Latuda, Lithium,     Review of Systems:  Review of Systems  Musculoskeletal:  Negative for gait problem.  Neurological:  Negative for tremors.  Psychiatric/Behavioral:         Please refer to HPI    Medications: I have reviewed the patient's current medications.  Current Outpatient Medications  Medication Sig Dispense Refill   Acetylcysteine (N-ACETYL CYSTEINE) 600 MG CAPS 1 capsule Orally Twice a day     amLODipine (NORVASC) 5 MG tablet Take 1 tablet (5 mg total) by mouth daily. 90 tablet 1   atorvastatin (LIPITOR) 10 MG tablet TAKE 1 TABLET(10 MG) BY MOUTH DAILY 90 tablet 1   augmented betamethasone dipropionate (DIPROLENE-AF) 0.05 % cream APPLY TO THE AFFECTED AREAS OF LEG TWICE DAILY AS NEEDED FOR  RASH. NOT TO FACE, GROIN, UNDERARMS 50 g 0   B Complex Vitamins (VITAMIN B COMPLEX) TABS 1 tablet Orally  Once daily     betamethasone dipropionate 0.05 % cream 1 application Externally Twice a day     Cholecalciferol 50 MCG (2000 UT) TABS 1 tablet Orally Once a day for 30 day(s)     Golimumab (SIMPONI ARIA IV) Infusion every 8 weeks     ipratropium (ATROVENT) 0.03 % nasal spray Place 2 sprays into both nostrils every 12 (twelve) hours. 30 mL 12   lamoTRIgine (LAMICTAL) 200 MG tablet Take 1 tablet (200 mg total) by mouth at bedtime. 90 tablet 3   levothyroxine (SYNTHROID) 50 MCG tablet Take 1 tablet (50 mcg total) by mouth as directed. Take 2 tabs on Sundays and 1 tab rest of the week 96 tablet 2   LORazepam (ATIVAN) 1 MG tablet TAKE 1 TABLET(1 MG) BY MOUTH TWICE DAILY AS NEEDED 60 tablet 2   metoprolol succinate (TOPROL XL) 25 MG 24 hr tablet Take 0.5 tablets (12.5 mg total) by mouth daily. 45 tablet 3   metroNIDAZOLE (METROGEL) 0.75 % gel Apply to face 1-2 times daily 45 g 1   Omega-3 Fatty Acids (FISH OIL) 1200 MG CAPS Take 1 capsule by mouth in the morning and at bedtime.     Probiotic Product (ALIGN) 4 MG CAPS Take 1 capsule by mouth daily in the afternoon.     sodium bicarbonate 650 MG tablet Take 650 mg by mouth 2 (two) times daily.     traZODone (DESYREL) 50 MG tablet Take 1 tablet (50 mg total) by mouth at bedtime as needed for sleep. 60 tablet 1   No current facility-administered medications for this visit.    Medication Side Effects: None  Allergies:  Allergies  Allergen Reactions   Aripiprazole Other (See Comments)    Parkinsonism      Lactose Intolerance (Gi) Diarrhea   Methotrexate Other (See Comments)    Hair loss, severe stomatitis     Cefdinir Diarrhea    Other reaction(s): Diarrhea Yeast infection and fever; negative c diff   Etanercept Other (See Comments)    Headaches     Exemestane Other (See Comments)    Suicidal thoughts with medication     Fluoxetine Other (See Comments)    Parkinsonism   Methylprednisolone Sodium Succ Other (See Comments)    Agitated  mania   Epinephrine Palpitations    tachycardia    Nitrofurantoin Other (See Comments)     GI upset from 2012    Past Medical History:  Diagnosis Date   Bipolar 1 disorder (HCC)    Chronic diarrhea    loose stools twice a day on average for years   CKD stage G4/A1, 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 1.4 cm  Grade I invasive lobular, ER and PR +/Her--, Ki 67 <5% Tried anastrozole for one month, but developed suicidal idea   Osteoarthritis, knee 09/24/2019   Xray 09/2019   Parkinsonism    Drug induced   Polyposis of colon    Psoriatic arthritis (HCC)    PTSD (post-traumatic stress disorder)    Secondary hyperparathyroidism (HCC)    Tardive dyskinesia    Thyroid disease     Family History  Problem Relation Age  of Onset   Lymphoma Mother 33   Lymphoma Sister 48   Breast cancer Sister 30   Colon polyps Sister    Lung cancer Sister        former smoker   Stroke Maternal Grandfather    Diabetes Paternal Grandfather    Colon cancer Neg Hx    Esophageal cancer Neg Hx    Stomach cancer Neg Hx    Rectal cancer Neg Hx     Social History   Socioeconomic History   Marital status: Married    Spouse name: Koren Bound   Number of children: 3   Years of education: Not on file   Highest education level: Not on file  Occupational History   Occupation: retired  Tobacco Use   Smoking status: Never   Smokeless tobacco: Never  Vaping Use   Vaping status: Never Used  Substance and Sexual Activity   Alcohol use: Never   Drug use: Never   Sexual activity: Not Currently  Other Topics Concern   Not on file  Social History Narrative   Moved to area from [place] 08/2018   Lives one story home   Right handed.   Social Determinants of Health   Financial Resource Strain: Low  Risk  (10/28/2022)   Overall Financial Resource Strain (CARDIA)    Difficulty of Paying Living Expenses: Not hard at all  Food Insecurity: No Food Insecurity (10/28/2022)   Hunger Vital Sign    Worried About Running Out of Food in the Last Year: Never true    Ran Out of Food in the Last Year: Never true  Transportation Needs: No Transportation Needs (10/28/2022)   PRAPARE - Administrator, Civil Service (Medical): No    Lack of Transportation (Non-Medical): No  Physical Activity: Insufficiently Active (10/28/2022)   Exercise Vital Sign    Days of Exercise per Week: 2 days    Minutes of Exercise per Session: 40 min  Stress: Stress Concern Present (10/28/2022)   [employer] of Occupational Health - Occupational Stress Questionnaire    Feeling of Stress : Rather much  Social Connections: Socially Integrated (10/28/2022)   Social Connection and Isolation Panel [NHANES]    Frequency of Communication with Friends and Family: Three times a week    Frequency of Social Gatherings with Friends and Family: More than three times a week    Attends Religious Services: More than 4 times per year    Active Member of [employer] or Organizations: Yes    Attends Banker Meetings: More than 4 times per year    Marital Status: Married  Catering manager Violence: Not At Risk (04/15/2022)   Humiliation, Afraid, Rape, and Kick questionnaire    Fear of Current or Ex-Partner: No    Emotionally Abused: No    Physically Abused: No    Sexually Abused: No    Past Medical History, Surgical history, Social history, and Family history were reviewed and updated as appropriate.   Please see review of systems for further details on the patient's review from today.   Objective:   Physical Exam:  There were no vitals taken for this visit.  Physical Exam Constitutional:      General: She is not in acute distress. Musculoskeletal:        General: No deformity.  Neurological:     Mental  Status: She is alert and oriented to person, place, and time.     Coordination: Coordination normal.  Psychiatric:  Attention and Perception: Attention and perception normal. She does not perceive auditory or visual hallucinations.        Mood and Affect: Affect is not labile, blunt, angry or inappropriate.        Speech: Speech normal.        Behavior: Behavior normal.        Thought Content: Thought content normal. Thought content is not paranoid or delusional. Thought content does not include homicidal or suicidal ideation. Thought content does not include homicidal or suicidal plan.        Cognition and Memory: Cognition and memory normal.        Judgment: Judgment normal.     Comments: Insight intact     Lab Review:     Component Value Date/Time   NA 141 07/12/2022 1108   NA 142 10/17/2021 0000   K 4.3 07/12/2022 1108   CL 105 07/12/2022 1108   CO2 26 07/12/2022 1108   GLUCOSE 69 (L) 07/12/2022 1108   BUN 32 (H) 07/12/2022 1108   BUN 30 (A) 10/17/2021 0000   CREATININE 2.27 (H) 07/12/2022 1108   CREATININE 2.29 (H) 05/22/2021 1044   CALCIUM 8.9 07/19/2022 1100   PROT 7.1 07/15/2022 1515   ALBUMIN 4.4 07/15/2022 1515   AST 25 07/15/2022 1515   ALT 29 07/15/2022 1515   ALKPHOS 138 (H) 07/15/2022 1515   BILITOT 0.9 07/15/2022 1515   GFRNONAA 22 (L) 04/03/2022 1425   GFRAA 25 (L) 09/13/2019 1243       Component Value Date/Time   WBC 7.7 07/12/2022 1108   RBC 4.18 07/12/2022 1108   HGB 12.9 07/12/2022 1108   HCT 38.6 07/12/2022 1108   PLT 201.0 07/12/2022 1108   MCV 92.3 07/12/2022 1108   MCH 30.5 04/03/2022 1425   MCHC 33.4 07/12/2022 1108   RDW 13.5 07/12/2022 1108   LYMPHSABS 3.4 07/12/2022 1108   MONOABS 0.9 07/12/2022 1108   EOSABS 0.3 07/12/2022 1108   BASOSABS 0.1 07/12/2022 1108    Lithium Lvl  Date Value Ref Range Status  05/22/2021 0.7 0.6 - 1.2 mmol/L Final     No results found for: "PHENYTOIN", "PHENOBARB", "VALPROATE", "CBMZ"    .res Assessment: Plan:    Plan:  Lorazepam 1mg  BID for anxiety - may take one tablet extra for severe anxiety symptoms. Taking one at night routinely.  Lamictal 200mg  hs.  Trazadone 50mg  as needed  NAC tablets BID - for obsessive thoughts, worry, and rumination - unable to take SSRI's  RTC weekly  Counseled patient regarding potential benefits, risks, and side effects of Lamictal to include potential risk of Stevens-Johnson syndrome. Advised patient to stop taking Lamictal and contact office immediately if rash develops and to seek urgent medical attention if rash is severe and/or spreading quickly.  Discussed potential benefits, risk, and side effects of benzodiazepines to include potential risk of tolerance and dependence, as well as possible drowsiness.  Advised patient not to drive if experiencing drowsiness and to take lowest possible effective dose to minimize risk of dependence and tolerance.   There are no diagnoses linked to this encounter.   Please see After Visit Summary for patient specific instructions.  Future Appointments  Date Time Provider Department Center  11/06/2022 11:20 AM Kadir Azucena, Thereasa Solo, NP [facility] None  11/12/2022 11:50 AM Shamleffer, Konrad Dolores, MD [facility] None  11/14/2022 11:30 AM Van Clines, MD [facility] None  11/15/2022  1:20 PM Aarib Pulido, Thereasa Solo, NP [facility] None  11/22/2022  1:00 PM  Robena Ewy, Thereasa Solo, NP [facility] None  11/26/2022 11:00 AM Dessie Coma, PT [facility]  04/24/2023  2:00 PM [facility] ANNUAL WELLNESS VISIT 1 [facility]    No orders of the defined types were placed in this encounter.     -------------------------------

## 2022-11-12 ENCOUNTER — Ambulatory Visit (INDEPENDENT_AMBULATORY_CARE_PROVIDER_SITE_OTHER): Payer: Medicare Other | Admitting: Internal Medicine

## 2022-11-12 ENCOUNTER — Encounter: Payer: Self-pay | Admitting: Internal Medicine

## 2022-11-12 VITALS — BP 120/80 | HR 90 | Ht 64.0 in | Wt 181.0 lb

## 2022-11-12 DIAGNOSIS — E039 Hypothyroidism, unspecified: Secondary | ICD-10-CM | POA: Diagnosis not present

## 2022-11-12 NOTE — Progress Notes (Signed)
Name: Tiffany [name]  MRN/ DOB: [id number], 09/04/1950    Age/ Sex: 72 y.o., female    PCP: Jarold Motto, PA   Reason for Endocrinology Evaluation: Hypothyroidism     Date of Initial Endocrinology Evaluation: 12/31/2021    HPI: Tiffany [name] is a 72 y.o. female with a past medical history of Hx of Breast Ca (no XRT no chemo), bipolar disorder, CKD, TD and parkinson's disease. The patient presented for initial endocrinology clinic visit on 12/31/2021 for consultative assistance with her Hypothyroidism.     Most of the history has been obtained from her spouse Pt has been diagnosed with hypothyroidism ~ 30 yrs ago while on Lithium  requiring LT-4 replacement.  She was on lithium until ~ 11/2021 when it was stopped  due to CKD which resulted in lower TSH and fluctuating TFT's    Saw Dr. Talmage Nap which has reduced levothyroxine to  below dose   Sister with thyroid disease  SUBJECTIVE:    Today (11/12/22):  Tiffany [name] is here for follow-up on hypothyroidism . She is accompanied by her spouse today.  Weight overall stable  She follows with  [facility]rology [facility] The past two months, she has noted with worsening depression to almost suicidal level, we increased levothyroxine last week with a TSH 4.62 uIU/mL  Denies local neck swelling  Constipation is improving  Denies palpitations  Denies tremors    Levothyroxine 50 mcg, 2 tabs on Monday  and 1 tab rest of the week     HISTORY:  Past Medical History:  Past Medical History:  Diagnosis Date   Bipolar 1 disorder (HCC)    Chronic diarrhea    loose stools twice a day on average for years   CKD stage G4/A1, 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 1.4 cm  Grade I invasive lobular, ER and PR +/Her--, Ki 67 <5% Tried  anastrozole for one month, but developed suicidal idea   Osteoarthritis, knee 09/24/2019   Xray 09/2019   Parkinsonism    Drug induced   Polyposis of colon    Psoriatic arthritis (HCC)    PTSD (post-traumatic stress disorder)    Secondary hyperparathyroidism (HCC)    Tardive dyskinesia    Thyroid disease    Past Surgical History:  Past Surgical History:  Procedure Laterality Date   ABDOMINAL HYSTERECTOMY  1987   CHOLECYSTECTOMY  1979   DILATION AND CURETTAGE OF UTERUS  1973   MASTECTOMY Bilateral 09/19/2014   TONSILLECTOMY  1970   URETERAL REIMPLANTION Bilateral 1974    Social History:  reports that she has never smoked. She has never used smokeless tobacco. She reports that she does not drink alcohol and does not use drugs. Family History: family history includes Breast cancer (age of onset: 110) in her sister; Colon polyps in her sister; Diabetes in her paternal grandfather; Lung cancer in her sister; Lymphoma (age of onset: 38) in her sister; Lymphoma (age of onset: 50) in her mother; Stroke in her maternal grandfather.   HOME MEDICATIONS: Allergies as of 11/12/2022       Reactions   Aripiprazole Other (See Comments)   Parkinsonism     Lactose Intolerance (gi) Diarrhea   Methotrexate Other (See Comments)   Hair loss, severe stomatitis  Cefdinir Diarrhea   Other reaction(s): Diarrhea Yeast infection and fever; negative c diff   Etanercept Other (See Comments)   Headaches   Exemestane Other (See Comments)   Suicidal thoughts with medication   Fluoxetine Other (See Comments)   Parkinsonism   Methylprednisolone Sodium Succ Other (See Comments)   Agitated mania   Epinephrine Palpitations   tachycardia   Nitrofurantoin Other (See Comments)    GI upset from 2012        Medication List        Accurate as of November 12, 2022 11:43 AM. If you have any questions, ask your nurse or doctor.          STOP taking these medications    Vitamin B Complex  Tabs Stopped by: Johnney Ou Lisle Skillman       TAKE these medications    Align 4 MG Caps Take 1 capsule by mouth daily in the afternoon.   amLODipine 5 MG tablet Commonly known as: NORVASC Take 1 tablet (5 mg total) by mouth daily.   atorvastatin 10 MG tablet Commonly known as: LIPITOR TAKE 1 TABLET(10 MG) BY MOUTH DAILY   augmented betamethasone dipropionate 0.05 % cream Commonly known as: DIPROLENE-AF APPLY TO THE AFFECTED AREAS OF LEG TWICE DAILY AS NEEDED FOR RASH. NOT TO FACE, GROIN, UNDERARMS   betamethasone dipropionate 0.05 % cream 1 application Externally Twice a day   Cholecalciferol 50 MCG (2000 UT) Tabs 1 tablet Orally Once a day for 30 day(s)   Fish Oil 1200 MG Caps Take 1 capsule by mouth in the morning and at bedtime.   ipratropium 0.03 % nasal spray Commonly known as: ATROVENT Place 2 sprays into both nostrils every 12 (twelve) hours.   lamoTRIgine 200 MG tablet Commonly known as: LAMICTAL Take 1 tablet (200 mg total) by mouth at bedtime.   levothyroxine 50 MCG tablet Commonly known as: SYNTHROID Take 1 tablet (50 mcg total) by mouth as directed. Take 2 tabs on Sundays and 1 tab rest of the week   LORazepam 1 MG tablet Commonly known as: ATIVAN TAKE 1 TABLET(1 MG) BY MOUTH TWICE DAILY AS NEEDED   metoprolol succinate 25 MG 24 hr tablet Commonly known as: Toprol XL Take 0.5 tablets (12.5 mg total) by mouth daily.   metroNIDAZOLE 0.75 % gel Commonly known as: METROGEL Apply to face 1-2 times daily   N-Acetyl Cysteine 600 MG Caps Generic drug: Acetylcysteine 1 capsule Orally Twice a day   SIMPONI ARIA IV Infusion every 8 weeks   sodium bicarbonate 650 MG tablet Take 650 mg by mouth 2 (two) times daily.   traZODone 50 MG tablet Commonly known as: DESYREL Take 1 tablet (50 mg total) by mouth at bedtime as needed for sleep.          REVIEW OF SYSTEMS: A comprehensive ROS was conducted with the patient and is negative except as per  HPI   OBJECTIVE:  VS: BP 120/80 (BP Location: Right Arm, Patient Position: Sitting, Cuff Size: Large)   Pulse 90   Ht 5\' 4"  (1.626 m)   Wt 181 lb (82.1 kg)   SpO2 93%   BMI 31.07 kg/m    Wt Readings from Last 3 Encounters:  11/12/22 181 lb (82.1 kg)  10/29/22 181 lb 9.6 oz (82.4 kg)  10/29/22 180 lb (81.6 kg)     EXAM: General: Pt appears well and is in NAD  Neck: General: Supple without adenopathy. Thyroid: Thyroid size normal.  No goiter or  nodules appreciated.   Lungs: Clear with good BS bilat   Heart: Auscultation: RRR.  Extremities:  BL LE: No pretibial edema   Mental Status: Judgment, insight: Intact Orientation: Oriented to time, place, and person Mood and affect: No depression, anxiety, or agitation     DATA REVIEWED:   Latest Reference Range & Units 10/25/22 10:41  TSH 0.35 - 5.50 uIU/mL 4.62  T4,Free(Direct) 0.60 - 1.60 ng/dL 5.62    ASSESSMENT/PLAN/RECOMMENDATIONS:   Hypothyroidism:   -This has been attributed to chronic lithium use.  She has been off lithium since  ~ 11/2021  - pt with depression that has been improving at this time  -TSH tends to fluctuate , recently increased Levothyroxine , will recheck TFT in 6 weeks  - Will try to keep TSH goal 2-3 uIU/mL    Medications : Continue levothyroxine 50 mcg , 2 tabs on Mondays and 1 tab rest of the week    F/U in 6 months  Signed electronically by: Lyndle Herrlich, MD  [facility]  [facility] [address] [place], [place] [address] Phone: [phone] FAX: [phone]   CC: Jarold Motto, [place] [address] [place] [address] Phone: [phone] Fax: [phone]   Return to Endocrinology clinic as below: Future Appointments  Date Time Provider Department Center  11/12/2022 11:50 AM Shivon Hackel, Konrad Dolores, MD [facility] None  11/14/2022 11:30 AM Van Clines, MD [facility] None  11/15/2022  1:20 PM Mozingo, Thereasa Solo, NP [facility] None  11/22/2022  1:00 PM Mozingo, Thereasa Solo, NP [facility] None  11/26/2022 11:00 AM Dessie Coma, PT [facility]  04/24/2023  2:00 PM [facility] ANNUAL WELLNESS VISIT 1 [facility]

## 2022-11-12 NOTE — Patient Instructions (Signed)
You are on levothyroxine - which is your thyroid hormone supplement. You MUST take this consistently.  You should take this first thing in the morning on an empty stomach with water. You should not take it with other medications. Wait 30min to 1hr prior to eating. If you are taking any vitamins - please take these in the evening.   If you miss a dose, please take your missed dose the following day (double the dose for that day). You should have a pill box for ONLY levothyroxine on your bedside table to help you remember to take your medications.  

## 2022-11-14 ENCOUNTER — Ambulatory Visit: Payer: Medicare Other | Admitting: Neurology

## 2022-11-15 ENCOUNTER — Ambulatory Visit (INDEPENDENT_AMBULATORY_CARE_PROVIDER_SITE_OTHER): Payer: Medicare Other | Admitting: Adult Health

## 2022-11-15 ENCOUNTER — Encounter: Payer: Self-pay | Admitting: Adult Health

## 2022-11-15 DIAGNOSIS — F4310 Post-traumatic stress disorder, unspecified: Secondary | ICD-10-CM

## 2022-11-15 DIAGNOSIS — F5105 Insomnia due to other mental disorder: Secondary | ICD-10-CM

## 2022-11-15 DIAGNOSIS — F319 Bipolar disorder, unspecified: Secondary | ICD-10-CM

## 2022-11-15 DIAGNOSIS — F411 Generalized anxiety disorder: Secondary | ICD-10-CM

## 2022-11-15 DIAGNOSIS — F99 Mental disorder, not otherwise specified: Secondary | ICD-10-CM

## 2022-11-15 NOTE — Progress Notes (Signed)
Tiffany [name] [id number] 1950-08-05 72 y.o.  Subjective:   Patient ID:  Tiffany [name] is a 72 y.o. (DOB July 03, 1950) female.  Chief Complaint: No chief complaint on file.   HPI Tiffany [name] presents to the office today for follow-up of PTSD, insomnia, GAD and BPD- 1.  Describes mood today as "about the same". Pleasant. Reports tearfulness. Mood symptoms - reports rapid cycling - reports hypomanic symptoms. Reports depression, anxiety and irritability. Denies panic attacks. Reports worry, rumination and over thinking. Reports feeling of anger. Reports feeling overwhelmed at times. Reports racing thoughts. Mood is variable. Stating "I feel like I'm doing better". Taking Lamictal as prescribed - 200mg  daily. Also taking Trazadone and Ativan as needed. Varying interest and motivation. Taking medications as prescribed.  Energy levels good. Active, exercising.  Enjoys some usual interests and activities. Married. Lives with husband and daughter. Talking to family and friends.  Appetite adequate. Weight loss - 175 pounds. Sleeps well most nights. Averages 8 or more hours. Denies daytime napping. Reports focus and concentration "a little better". Completing tasks. Managing some aspects of household. Retired.  Reports passive SI - "it's manageable". Denies HI.  Denies AH or VH. Denies self harm. Denies substance use.  Previous medications: Celexa, Zyprexa, Tegretol, Depakote, Serzone, Topamax, Seroquel, Effexor, Lexapro, Desipramine, Neurontin, Abilify, Geodon, Propanolol, Cymbalta, Cogentin, Trihexyphenadyl, Sinmmet, Provigil, Selegiline, Requip, Amantadine, Prozac, Mirapex, Azilect, Metoclopramide, Baclofen, Artane, Namenda, Latuda, Lithium,      GAD-7    Flowsheet Row Office Visit from 10/29/2022 in [facility] from 05/06/2022 in [facility] Visit from 04/25/2022 in [facility] from 04/10/2022  in [facility] from 02/16/2021 in [facility]  Total GAD-7 Score 16 4 9 17 13       Mini-Mental    Flowsheet Row Office Visit from 06/07/2019 in [facility]  Total Score (max 30 points ) 29      PHQ2-9    Flowsheet Row Office Visit from 10/29/2022 in [facility] from 07/15/2022 in [facility] from 05/06/2022 in [facility] from 04/25/2022 in [facility] Clinical Support from 04/15/2022 in [facility]  PHQ-2 Total Score 2 3 0 0 0  PHQ-9 Total Score 13 15 0 6 0      Flowsheet Row Clinical Support from 04/15/2022 in [facility] from 04/03/2022 in [facility] at [facility] from 11/26/2021 in [facility] Emergency Department at [facility]  C-SSRS RISK CATEGORY No Risk No Risk No Risk        Review of Systems:  Review of Systems  Musculoskeletal:  Negative for gait problem.  Neurological:  Negative for tremors.  Psychiatric/Behavioral:         Please refer to HPI    Medications: I have reviewed the patient's current medications.  Current Outpatient Medications  Medication Sig Dispense Refill   Acetylcysteine (N-ACETYL CYSTEINE) 600 MG CAPS 1 capsule Orally Twice a day     amLODipine (NORVASC) 5 MG tablet Take 1 tablet (5 mg total) by mouth daily. 90 tablet 1   atorvastatin (LIPITOR) 10 MG tablet TAKE 1 TABLET(10 MG) BY MOUTH DAILY 90 tablet 1   augmented betamethasone dipropionate (DIPROLENE-AF) 0.05 % cream APPLY TO THE AFFECTED AREAS OF LEG TWICE DAILY AS NEEDED FOR RASH. NOT TO FACE, GROIN, UNDERARMS  50 g 0   betamethasone dipropionate 0.05 % cream 1 application Externally Twice a day     Cholecalciferol 50 MCG (2000 UT) TABS 1 tablet Orally Once a day for 30 day(s)     Golimumab (SIMPONI  ARIA IV) Infusion every 8 weeks     ipratropium (ATROVENT) 0.03 % nasal spray Place 2 sprays into both nostrils every 12 (twelve) hours. 30 mL 12   lamoTRIgine (LAMICTAL) 200 MG tablet Take 1 tablet (200 mg total) by mouth at bedtime. 90 tablet 3   levothyroxine (SYNTHROID) 50 MCG tablet Take 1 tablet (50 mcg total) by mouth as directed. Take 2 tabs on Sundays and 1 tab rest of the week 96 tablet 2   LORazepam (ATIVAN) 1 MG tablet TAKE 1 TABLET(1 MG) BY MOUTH TWICE DAILY AS NEEDED 60 tablet 2   metoprolol succinate (TOPROL XL) 25 MG 24 hr tablet Take 0.5 tablets (12.5 mg total) by mouth daily. 45 tablet 3   metroNIDAZOLE (METROGEL) 0.75 % gel Apply to face 1-2 times daily 45 g 1   Omega-3 Fatty Acids (FISH OIL) 1200 MG CAPS Take 1 capsule by mouth in the morning and at bedtime.     Probiotic Product (ALIGN) 4 MG CAPS Take 1 capsule by mouth daily in the afternoon.     sodium bicarbonate 650 MG tablet Take 650 mg by mouth 2 (two) times daily.     traZODone (DESYREL) 50 MG tablet Take 1 tablet (50 mg total) by mouth at bedtime as needed for sleep. 60 tablet 1   No current facility-administered medications for this visit.    Medication Side Effects: None  Allergies:  Allergies  Allergen Reactions   Aripiprazole Other (See Comments)    Parkinsonism      Lactose Intolerance (Gi) Diarrhea   Methotrexate Other (See Comments)    Hair loss, severe stomatitis     Cefdinir Diarrhea    Other reaction(s): Diarrhea Yeast infection and fever; negative c diff   Etanercept Other (See Comments)    Headaches     Exemestane Other (See Comments)    Suicidal thoughts with medication     Fluoxetine Other (See Comments)    Parkinsonism   Methylprednisolone Sodium Succ Other (See Comments)    Agitated mania   Epinephrine Palpitations    tachycardia    Nitrofurantoin Other (See Comments)     GI upset from 2012    Past Medical History:  Diagnosis Date   Bipolar 1 disorder (HCC)    Chronic  diarrhea    loose stools twice a day on average for years   CKD stage G4/A1, 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 1.4 cm  Grade I invasive lobular, ER and PR +/Her--, Ki 67 <5% Tried anastrozole for one month, but developed suicidal idea   Osteoarthritis, knee 09/24/2019   Xray 09/2019   Parkinsonism    Drug induced   Polyposis of colon    Psoriatic arthritis (HCC)    PTSD (post-traumatic stress disorder)    Secondary hyperparathyroidism (HCC)    Tardive dyskinesia    Thyroid disease     Past Medical History, Surgical history, Social history, and Family history were reviewed and updated as appropriate.   Please see review of systems for further details on the  patient's review from today.   Objective:   Physical Exam:  There were no vitals taken for this visit.  Physical Exam Constitutional:      General: She is not in acute distress. Musculoskeletal:        General: No deformity.  Neurological:     Mental Status: She is alert and oriented to person, place, and time.     Coordination: Coordination normal.  Psychiatric:        Attention and Perception: Attention and perception normal. She does not perceive auditory or visual hallucinations.        Mood and Affect: Affect is not labile, blunt, angry or inappropriate.        Speech: Speech normal.        Behavior: Behavior normal.        Thought Content: Thought content normal. Thought content is not paranoid or delusional. Thought content does not include homicidal or suicidal ideation. Thought content does not include homicidal or suicidal plan.        Cognition and Memory: Cognition and memory normal.        Judgment: Judgment normal.     Comments: Insight intact     Lab Review:     Component Value Date/Time   NA  141 07/12/2022 1108   NA 142 10/17/2021 0000   K 4.3 07/12/2022 1108   CL 105 07/12/2022 1108   CO2 26 07/12/2022 1108   GLUCOSE 69 (L) 07/12/2022 1108   BUN 32 (H) 07/12/2022 1108   BUN 30 (A) 10/17/2021 0000   CREATININE 2.27 (H) 07/12/2022 1108   CREATININE 2.29 (H) 05/22/2021 1044   CALCIUM 8.9 07/19/2022 1100   PROT 7.1 07/15/2022 1515   ALBUMIN 4.4 07/15/2022 1515   AST 25 07/15/2022 1515   ALT 29 07/15/2022 1515   ALKPHOS 138 (H) 07/15/2022 1515   BILITOT 0.9 07/15/2022 1515   GFRNONAA 22 (L) 04/03/2022 1425   GFRAA 25 (L) 09/13/2019 1243       Component Value Date/Time   WBC 7.7 07/12/2022 1108   RBC 4.18 07/12/2022 1108   HGB 12.9 07/12/2022 1108   HCT 38.6 07/12/2022 1108   PLT 201.0 07/12/2022 1108   MCV 92.3 07/12/2022 1108   MCH 30.5 04/03/2022 1425   MCHC 33.4 07/12/2022 1108   RDW 13.5 07/12/2022 1108   LYMPHSABS 3.4 07/12/2022 1108   MONOABS 0.9 07/12/2022 1108   EOSABS 0.3 07/12/2022 1108   BASOSABS 0.1 07/12/2022 1108    Lithium Lvl  Date Value Ref Range Status  05/22/2021 0.7 0.6 - 1.2 mmol/L Final     No results found for: "PHENYTOIN", "PHENOBARB", "VALPROATE", "CBMZ"   .res Assessment: Plan:     Plan:  Lorazepam 1mg  BID for anxiety - may take one tablet extra for severe anxiety symptoms. Taking one at night routinely.  Lamictal 200mg  hs.  Trazadone 50mg  as needed  NAC tablets BID - for obsessive thoughts, worry, and rumination - unable to take SSRI's  RTC weekly  Counseled patient regarding potential benefits, risks, and side effects of Lamictal to include potential risk of Stevens-Johnson syndrome. Advised patient to stop taking Lamictal and contact office immediately if rash develops and to seek urgent medical attention if rash is severe and/or spreading quickly.  Discussed potential benefits, risk, and side effects of benzodiazepines to include potential risk of tolerance and dependence, as well as possible drowsiness.  Advised  patient not to drive if experiencing drowsiness and to take lowest possible effective dose  to minimize risk of dependence and tolerance.    There are no diagnoses linked to this encounter.   Please see After Visit Summary for patient specific instructions.  Future Appointments  Date Time Provider Department Center  11/22/2022  1:00 PM Vikki Gains, Thereasa Solo, NP [facility] None  11/26/2022 11:00 AM Dessie Coma, PT [facility]  12/24/2022 10:15 AM [facility] None  04/24/2023  2:00 PM [facility] ANNUAL WELLNESS VISIT 1 [facility]    No orders of the defined types were placed in this encounter.   -------------------------------

## 2022-11-22 ENCOUNTER — Telehealth: Payer: Medicare Other | Admitting: Adult Health

## 2022-11-26 ENCOUNTER — Ambulatory Visit (HOSPITAL_BASED_OUTPATIENT_CLINIC_OR_DEPARTMENT_OTHER): Payer: Medicare Other | Admitting: Physical Therapy

## 2022-11-28 ENCOUNTER — Ambulatory Visit (HOSPITAL_BASED_OUTPATIENT_CLINIC_OR_DEPARTMENT_OTHER): Payer: Medicare Other | Admitting: Physical Therapy

## 2022-12-05 ENCOUNTER — Ambulatory Visit (HOSPITAL_BASED_OUTPATIENT_CLINIC_OR_DEPARTMENT_OTHER): Payer: Medicare Other | Admitting: Physical Therapy

## 2022-12-11 ENCOUNTER — Encounter: Payer: Self-pay | Admitting: Physician Assistant

## 2022-12-13 ENCOUNTER — Encounter: Payer: Self-pay | Admitting: Physician Assistant

## 2022-12-13 ENCOUNTER — Ambulatory Visit (HOSPITAL_BASED_OUTPATIENT_CLINIC_OR_DEPARTMENT_OTHER): Payer: Medicare Other | Admitting: Physical Therapy

## 2022-12-15 ENCOUNTER — Other Ambulatory Visit: Payer: Self-pay | Admitting: Physician Assistant

## 2022-12-18 ENCOUNTER — Encounter: Payer: Self-pay | Admitting: Physician Assistant

## 2022-12-19 ENCOUNTER — Encounter: Payer: Self-pay | Admitting: *Deleted

## 2022-12-24 ENCOUNTER — Other Ambulatory Visit (INDEPENDENT_AMBULATORY_CARE_PROVIDER_SITE_OTHER): Payer: Medicare Other

## 2022-12-24 DIAGNOSIS — E039 Hypothyroidism, unspecified: Secondary | ICD-10-CM | POA: Diagnosis not present

## 2022-12-24 LAB — TSH: TSH: 2.41 u[IU]/mL (ref 0.35–5.50)

## 2022-12-24 LAB — T4, FREE: Free T4: 0.85 ng/dL (ref 0.60–1.60)

## 2022-12-25 ENCOUNTER — Other Ambulatory Visit: Payer: Self-pay | Admitting: Physician Assistant

## 2022-12-30 ENCOUNTER — Ambulatory Visit (INDEPENDENT_AMBULATORY_CARE_PROVIDER_SITE_OTHER): Payer: Medicare Other | Admitting: Family Medicine

## 2022-12-30 ENCOUNTER — Encounter: Payer: Self-pay | Admitting: Family Medicine

## 2022-12-30 VITALS — BP 146/80 | HR 86 | Temp 100.1°F | Ht 64.0 in | Wt 173.2 lb

## 2022-12-30 DIAGNOSIS — U071 COVID-19: Secondary | ICD-10-CM | POA: Diagnosis not present

## 2022-12-30 DIAGNOSIS — J029 Acute pharyngitis, unspecified: Secondary | ICD-10-CM | POA: Diagnosis not present

## 2022-12-30 DIAGNOSIS — R051 Acute cough: Secondary | ICD-10-CM

## 2022-12-30 LAB — POC COVID19 BINAXNOW: SARS Coronavirus 2 Ag: POSITIVE — AB

## 2022-12-30 NOTE — Progress Notes (Signed)
Subjective  CC:  Chief Complaint  Patient presents with   Sore Throat    Pt stated that her symptoms has been going on tor the past 3 days. Fever is 100.1, back pain, and headache   Cough    Same day acute visit; PCP not available.  Chart reviewed.  HPI: Tiffany [name] is a 72 y.o. female who presents to the office today to address the problems listed above in the chief complaint. Covid sxs started 4 days ago: low grade fevers, malaise, myalgias, st and mild cough. No sob, chest pain, n/v/d. Appetite is down but drinking fluids fine. Had covid booster 2 weeks ago. Took some tylenol 6 hours ago.    Assessment  1. COVID-19   2. Acute cough   3. Sore throat      Plan  covid:  respiratory status is stable. Discussed options of antiviral vs supportive care. Elect supportive care. Advil, mucinex dm, rest and fluids.   Follow up: prn 04/24/2023  Orders Placed This Encounter  Procedures   POC COVID-19   No orders of the defined types were placed in this encounter.     I reviewed the patients updated PMH, FH, and SocHx.    Patient Active Problem List   Diagnosis Date Noted   CKD (chronic kidney disease) stage 4, GFR 15-29 ml/min (HCC) 09/24/2019    Priority: High   Encephalopathy 04/26/2019    Priority: High   Psoriatic arthritis (HCC) 09/04/2018    Priority: High   Tubular adenoma of colon 05/28/2017    Priority: High   Tardive dyskinesia 10/18/2015    Priority: High   Bipolar affective disorder, mixed (HCC) 08/16/2004    Priority: High   Acquired hypothyroidism 04/03/1996    Priority: High   Osteoarthritis, knee 09/24/2019    Priority: Medium    Thrombocytopenia (HCC) 09/04/2018    Priority: Medium    History of colonic polyps 05/28/2017    Priority: Medium    History of breast cancer left 2016 12/27/2014    Priority: Medium    Vitamin D deficiency 09/04/2018    Priority: Low   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 09/12/2016     Priority: Low   Osteopenia determined by x-ray 10/31/2014    Priority: Low   Rosacea 05/21/2007    Priority: Low   Family history of breast cancer 06/05/2021   Severe recurrent major depression without psychotic features (HCC) 09/09/2019   Bipolar I disorder, most recent episode depressed (HCC)    Transaminitis    Leukocytosis 04/21/2019   Malignant neoplasm of overlapping sites of left breast in female, estrogen receptor positive (HCC) 03/14/2016   Current Meds  Medication Sig   amLODipine (NORVASC) 5 MG tablet TAKE 1 TABLET(5 MG) BY MOUTH DAILY   atorvastatin (LIPITOR) 10 MG tablet TAKE 1 TABLET(10 MG) BY MOUTH DAILY   augmented betamethasone dipropionate (DIPROLENE-AF) 0.05 % cream APPLY TO THE AFFECTED AREAS OF LEG TWICE DAILY AS NEEDED FOR RASH. NOT TO FACE, GROIN, UNDERARMS   Cholecalciferol 50 MCG (2000 UT) TABS 1 tablet Orally Once a day for 30 day(s)   Golimumab (SIMPONI ARIA IV) Infusion every 8 weeks   hydrOXYzine (ATARAX) 10 MG tablet Take by mouth.   ipratropium (ATROVENT) 0.03 % nasal spray Place 2 sprays into both nostrils every 12 (twelve) hours.   levothyroxine (SYNTHROID) 50 MCG tablet Take 1 tablet (50 mcg total) by mouth as directed. Take 2 tabs on Sundays and 1 tab  rest of the week   lurasidone (LATUDA) 80 MG TABS tablet SMARTSIG:1 Tablet(s) By Mouth Every Evening   Lurasidone HCl 60 MG TABS Take by mouth.   metoprolol succinate (TOPROL XL) 25 MG 24 hr tablet Take 0.5 tablets (12.5 mg total) by mouth daily.   metroNIDAZOLE (METROGEL) 0.75 % gel Apply to face 1-2 times daily   Omega-3 Fatty Acids (FISH OIL) 1200 MG CAPS Take 1 capsule by mouth in the morning and at bedtime.   Probiotic Product (ALIGN) 4 MG CAPS Take 1 capsule by mouth daily in the afternoon.    Allergies: Patient is allergic to aripiprazole, lactose intolerance (gi), methotrexate, nsaids, cefdinir, etanercept, exemestane, fluoxetine, methylprednisolone sodium succ, other, epinephrine, and  nitrofurantoin. Family History: Patient family history includes Breast cancer (age of onset: 41) in her sister; Colon polyps in her sister; Diabetes in her paternal grandfather; Lung cancer in her sister; Lymphoma (age of onset: 20) in her sister; Lymphoma (age of onset: 30) in her mother; Stroke in her maternal grandfather. Social History:  Patient  reports that she has never smoked. She has never used smokeless tobacco. She reports that she does not drink alcohol and does not use drugs.  Review of Systems: Constitutional: Negative for fever malaise or anorexia Cardiovascular: negative for chest pain Respiratory: negative for SOB or persistent cough Gastrointestinal: negative for abdominal pain  Objective  Vitals: BP (!) 146/80   Pulse 86   Temp 100.1 F (37.8 C)   Ht 5\' 4"  (1.626 m)   Wt 173 lb 3.2 oz (78.6 kg)   SpO2 97%   BMI 29.73 kg/m  General: no acute distress , A&Ox3, appears well HEENT: PEERL, conjunctiva normal, neck is supple Cardiovascular:  RRR without murmur or gallop.  Respiratory:  Good breath sounds bilaterally, CTAB with normal respiratory effort Skin:  Warm, no rashes  Office Visit on 12/30/2022  Component Date Value Ref Range Status   SARS Coronavirus 2 Ag 12/30/2022 Positive (A)  Negative Final     Commons side effects, risks, benefits, and alternatives for medications and treatment plan prescribed today were discussed, and the patient expressed understanding of the given instructions. Patient is instructed to call or message via MyChart if he/she has any questions or concerns regarding our treatment plan. No barriers to understanding were identified. We discussed Red Flag symptoms and signs in detail. Patient expressed understanding regarding what to do in case of urgent or emergency type symptoms.  Medication list was reconciled, printed and provided to the patient in AVS. Patient instructions and summary information was reviewed with the patient as  documented in the AVS. This note was prepared with assistance of Dragon voice recognition software. Occasional wrong-word or sound-a-like substitutions may have occurred due to the inherent limitations of voice recognition software

## 2022-12-31 ENCOUNTER — Ambulatory Visit (HOSPITAL_BASED_OUTPATIENT_CLINIC_OR_DEPARTMENT_OTHER): Payer: Medicare Other | Admitting: Physical Therapy

## 2023-01-06 ENCOUNTER — Encounter: Payer: Self-pay | Admitting: Physician Assistant

## 2023-01-22 ENCOUNTER — Ambulatory Visit (HOSPITAL_BASED_OUTPATIENT_CLINIC_OR_DEPARTMENT_OTHER): Payer: Medicare Other | Attending: Physician Assistant | Admitting: Physical Therapy

## 2023-01-22 ENCOUNTER — Other Ambulatory Visit: Payer: Self-pay

## 2023-01-22 ENCOUNTER — Encounter: Payer: Self-pay | Admitting: Psychiatry

## 2023-01-22 DIAGNOSIS — R2689 Other abnormalities of gait and mobility: Secondary | ICD-10-CM | POA: Insufficient documentation

## 2023-01-22 DIAGNOSIS — M6281 Muscle weakness (generalized): Secondary | ICD-10-CM | POA: Diagnosis not present

## 2023-01-22 DIAGNOSIS — G8929 Other chronic pain: Secondary | ICD-10-CM | POA: Insufficient documentation

## 2023-01-22 DIAGNOSIS — M25561 Pain in right knee: Secondary | ICD-10-CM | POA: Diagnosis present

## 2023-01-22 NOTE — Therapy (Signed)
OUTPATIENT PHYSICAL THERAPY LOWER EXTREMITY EVALUATION   Patient Name: Tiffany [name] MRN: [id number] DOB:05-23-1950, 72 y.o., female Today's Date: 01/23/2023   PT End of Session - 01/23/23 2106     Visit Number 1    Number of Visits 16    Date for PT Re-Evaluation 03/20/23    PT Start Time 1600    PT Stop Time 1643    PT Time Calculation (min) 43 min    Activity Tolerance Patient tolerated treatment well    Behavior During Therapy [facility] for tasks assessed/performed              Past Medical History:  Diagnosis Date   Bipolar 1 disorder (HCC)    Chronic diarrhea    loose stools twice a day on average for years   CKD stage G4/A1, 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 1.4 cm  Grade I invasive lobular, ER and PR +/Her--, Ki 67 <5% Tried anastrozole for one month, but developed suicidal idea   Osteoarthritis, knee 09/24/2019   Xray 09/2019   Parkinsonism (HCC)    Drug induced   Polyposis of colon    Psoriatic arthritis (HCC)    PTSD (post-traumatic stress disorder)    Secondary hyperparathyroidism (HCC)    Tardive dyskinesia    Thyroid disease    Past Surgical History:  Procedure Laterality Date   ABDOMINAL HYSTERECTOMY  1987   CHOLECYSTECTOMY  1979   DILATION AND CURETTAGE OF UTERUS  1973   MASTECTOMY Bilateral 09/19/2014   TONSILLECTOMY  1970   URETERAL REIMPLANTION Bilateral 1974   Patient Active Problem List   Diagnosis Date Noted   Family history of breast cancer 06/05/2021   Osteoarthritis, knee 09/24/2019   CKD (chronic kidney disease) stage 4, GFR 15-29 ml/min (HCC) 09/24/2019   Severe recurrent major depression without psychotic features (HCC) 09/09/2019   Bipolar I disorder, most recent episode depressed (HCC)    Transaminitis    Encephalopathy  04/26/2019   Leukocytosis 04/21/2019   Thrombocytopenia (HCC) 09/04/2018   Vitamin D deficiency 09/04/2018   Psoriatic arthritis (HCC) 09/04/2018   Tubular adenoma of colon 05/28/2017   History of colonic polyps 05/28/2017   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 09/12/2016   Malignant neoplasm of overlapping sites of left breast in female, estrogen receptor positive (HCC) 03/14/2016   Tardive dyskinesia 10/18/2015   History of breast cancer left 2016 12/27/2014   Osteopenia determined by x-ray 10/31/2014   Rosacea 05/21/2007   Bipolar affective disorder, mixed (HCC) 08/16/2004   Acquired hypothyroidism 04/03/1996    PCP: Jarold Motto, PA  REFERRING PROVIDER: Jarold Motto, PA  REFERRING DIAG:  [phone] - recurrent pain of right knee  THERAPY DIAG:  Other abnormalities of gait and mobility  Muscle weakness (generalized)  Chronic pain of right knee  Rationale for Evaluation and Treatment Rehabilitation  ONSET DATE: 3 weeks ago  SUBJECTIVE:   SUBJECTIVE STATEMENT: The patient was going down steps sometime around June when she felt a sharp pain in her knee.  She has had pain since that point.  She continues to work in the pool.  She feels like she is getting stronger with the work that she is doing in the pool.  She would like to work on land and see  if we can decrease the pain on her knee.  PERTINENT HISTORY: Bi-polar, CKD, depression, breast cancer, OA, Parkinson's, Psoriatic Arthritis, PTSD, tardive diskenesia,   PAIN:  Are you having pain? Yes: NPRS scale: 0/10 Pain location: right lateral knee Pain description: aching Aggravating factors: standing, walking , twisting Relieving factors: rest  PRECAUTIONS: None  WEIGHT BEARING RESTRICTIONS No  FALLS:  Has patient fallen in last 6 months? No  LIVING ENVIRONMENT: Lives with husband  OCCUPATION: retired   PLOF: Independent has not had to use a walker  PATIENT GOALS   To  have less pain in her knee and to get back into the gym   OBJECTIVE:   DIAGNOSTIC FINDINGS:  Nothing recent   PATIENT SURVEYS:   COGNITION:  Overall cognitive status: Within functional limits for tasks assessed     SENSATION: WFL   POSTURE: rounded shoulders and forward head  PALPATION: Trigger points in lower IT band   LOWER EXTREMITY ROM:  Active PROM Right eval Left eval  Hip flexion    Hip extension    Hip abduction    Hip adduction    Hip internal rotation    Hip external rotation    Knee flexion    Knee extension -7   Ankle dorsiflexion    Ankle plantarflexion    Ankle inversion    Ankle eversion     (Blank rows = not tested)  LOWER EXTREMITY MMT:  MMT Right eval Left eval  Hip flexion 17.7  17.8  Hip extension    Hip abduction 11.1 20.4  Hip adduction    Hip internal rotation    Hip external rotation    Knee flexion    Knee extension 18.8 17.3  Ankle dorsiflexion    Ankle plantarflexion    Ankle inversion    Ankle eversion     (Blank rows = not tested)   GAIT: Decreased single leg stance time on the right    TODAY'S TREATMENT: Manual: Knee extension ROM; trigger point release to lateral knee    PATIENT EDUCATION:  Education details: reviewed HEP; symptom management, activity progression  Person educated: Patient Education method: Explanation, Demonstration, Tactile cues, Verbal cues, and Handouts Education comprehension: verbalized understanding, returned demonstration, verbal cues required, tactile cues required, and needs further education   HOME EXERCISE PROGRAM:   ASSESSMENT:  CLINICAL IMPRESSION: Patient is a 72 y.o. female who was seen today for physical therapy evaluation and treatment for right knee pain. She has had improved knee pain and mobility in the past with PT. She returns today with an acute exacerbation of her knee pain.  She continues on limited knee extension but no significant decline from her previous  episode.  She has decreased balance with narrow base with eyes closed.  She is working on her pool program on her own.  She would benefit from skilled therapy to improve balance and continue to work on knee extension. OBJECTIVE IMPAIRMENTS Abnormal gait, decreased activity tolerance, decreased endurance, decreased mobility, difficulty walking, decreased ROM, decreased strength, increased fascial restrictions, and pain.   ACTIVITY LIMITATIONS carrying, bending, standing, squatting, stairs, transfers, and locomotion level  PARTICIPATION LIMITATIONS: cleaning, laundry, shopping, community activity, and yard work  PERSONAL FACTORS 3+ comorbidities: Psoriatic arthritis, Bi-polar , Parkinsons  are also affecting patient's functional outcome.   REHAB POTENTIAL: Good  CLINICAL DECISION MAKING: Evolving/moderate complexity  EVALUATION COMPLEXITY: Moderate   GOALS: Goals reviewed with patient? Yes  SHORT TERM GOALS: Target date: 02/20/2023   Patient will increase  gross right LE strength by 5 lbs  Baseline: Goal status: INITIAL  2.  Patient will be increase knee extension passive by 3 degrees  Baseline:  Goal status: INITIAL  3.  Patient will be independent with basic HEP  Baseline:  Goal status: INITIAL  LONG TERM GOALS: Target date: 03/20/2023    Patient will stand for 20 min without pain in order to perform her ADL's  Baseline:  Goal status: INITIAL  2.  Patient will be independent with full gym program  Baseline:  Goal status: INITIAL  3.  Patient will ambulate without increased pain community distances  Baseline:  Goal status: INITIAL    PLAN: PT FREQUENCY: 2x/week  PT DURATION: 8 weeks  PLANNED INTERVENTIONS: Therapeutic exercises, Therapeutic activity, Neuromuscular re-education, Balance training, Gait training, Patient/Family education, Joint mobilization, Stair training, DME instructions, Aquatic Therapy, Dry Needling, Electrical stimulation, Cryotherapy, Moist  heat, Ultrasound, and Manual therapy  PLAN FOR NEXT SESSION: continue with manual therapy to the knee; progress to standing exercises; progress back to gym activity as able.    Dessie Coma, PT 01/23/2023, 9:07 PM

## 2023-01-27 ENCOUNTER — Ambulatory Visit (INDEPENDENT_AMBULATORY_CARE_PROVIDER_SITE_OTHER): Payer: Medicare Other

## 2023-01-27 ENCOUNTER — Ambulatory Visit (INDEPENDENT_AMBULATORY_CARE_PROVIDER_SITE_OTHER): Payer: Medicare Other | Admitting: Podiatry

## 2023-01-27 ENCOUNTER — Encounter: Payer: Self-pay | Admitting: Neurology

## 2023-01-27 ENCOUNTER — Encounter: Payer: Self-pay | Admitting: Podiatry

## 2023-01-27 VITALS — Ht 64.0 in | Wt 173.0 lb

## 2023-01-27 DIAGNOSIS — M2042 Other hammer toe(s) (acquired), left foot: Secondary | ICD-10-CM | POA: Diagnosis not present

## 2023-01-31 ENCOUNTER — Ambulatory Visit (HOSPITAL_BASED_OUTPATIENT_CLINIC_OR_DEPARTMENT_OTHER): Payer: Medicare Other | Admitting: Physical Therapy

## 2023-01-31 ENCOUNTER — Encounter (HOSPITAL_BASED_OUTPATIENT_CLINIC_OR_DEPARTMENT_OTHER): Payer: Self-pay | Admitting: Physical Therapy

## 2023-01-31 DIAGNOSIS — M6281 Muscle weakness (generalized): Secondary | ICD-10-CM

## 2023-01-31 DIAGNOSIS — M25561 Pain in right knee: Secondary | ICD-10-CM | POA: Diagnosis not present

## 2023-01-31 DIAGNOSIS — R2689 Other abnormalities of gait and mobility: Secondary | ICD-10-CM

## 2023-01-31 DIAGNOSIS — G8929 Other chronic pain: Secondary | ICD-10-CM

## 2023-01-31 DIAGNOSIS — R2681 Unsteadiness on feet: Secondary | ICD-10-CM

## 2023-01-31 NOTE — Therapy (Signed)
OUTPATIENT PHYSICAL THERAPY LOWER EXTREMITY EVALUATION   Patient Name: Tiffany [name] MRN: [id number] DOB:[birth year], 72 y.o., female Today's Date: 01/31/2023   PT End of Session - 01/31/23 1517     Visit Number 2    Number of Visits 16    Date for PT Re-Evaluation 03/20/23    PT Start Time 1145    PT Stop Time 1227    PT Time Calculation (min) 42 min    Activity Tolerance Patient tolerated treatment well    Behavior During Therapy [facility] for tasks assessed/performed               Past Medical History:  Diagnosis Date   Bipolar 1 disorder (HCC)    Chronic diarrhea    loose stools twice a day on average for years   CKD stage G4/A1, 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 1.4 cm  Grade I invasive lobular, ER and PR +/Her--, Ki 67 <5% Tried anastrozole for one month, but developed suicidal idea   Osteoarthritis, knee 09/24/2019   Xray 09/2019   Parkinsonism (HCC)    Drug induced   Polyposis of colon    Psoriatic arthritis (HCC)    PTSD (post-traumatic stress disorder)    Secondary hyperparathyroidism (HCC)    Tardive dyskinesia    Thyroid disease    Past Surgical History:  Procedure Laterality Date   ABDOMINAL HYSTERECTOMY  1987   CHOLECYSTECTOMY  1979   DILATION AND CURETTAGE OF UTERUS  1973   MASTECTOMY Bilateral 09/19/2014   TONSILLECTOMY  1970   URETERAL REIMPLANTION Bilateral 1974   Patient Active Problem List   Diagnosis Date Noted   Family history of breast cancer 06/05/2021   Osteoarthritis, knee 09/24/2019   CKD (chronic kidney disease) stage 4, GFR 15-29 ml/min (HCC) 09/24/2019   Severe recurrent major depression without psychotic features (HCC) 09/09/2019   Bipolar I disorder, most recent episode depressed (HCC)    Transaminitis    Encephalopathy  04/26/2019   Leukocytosis 04/21/2019   Thrombocytopenia (HCC) 09/04/2018   Vitamin D deficiency 09/04/2018   Psoriatic arthritis (HCC) 09/04/2018   Tubular adenoma of colon 05/28/2017   History of colonic polyps 05/28/2017   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 09/12/2016   Malignant neoplasm of overlapping sites of left breast in female, estrogen receptor positive (HCC) 03/14/2016   Tardive dyskinesia 10/18/2015   History of breast cancer left 2016 12/27/2014   Osteopenia determined by x-ray 10/31/2014   Rosacea 05/21/2007   Bipolar affective disorder, mixed (HCC) 08/16/2004   Acquired hypothyroidism 04/03/1996    PCP: Jarold Motto, PA  REFERRING PROVIDER: Jarold Motto, PA  REFERRING DIAG:  [phone] - recurrent pain of right knee  THERAPY DIAG:  Other abnormalities of gait and mobility  Muscle weakness (generalized)  Chronic pain of right knee  Unsteadiness on feet  Rationale for Evaluation and Treatment Rehabilitation  ONSET DATE: 3 weeks ago  SUBJECTIVE:   SUBJECTIVE STATEMENT:  The patient reports her knee is doing pretty well. She has had some random pain in her hips.   PERTINENT HISTORY: Bi-polar, CKD, depression, breast cancer, OA, Parkinson's, Psoriatic Arthritis, PTSD, tardive diskenesia,   PAIN:  Are you having pain? Yes: NPRS scale: 0/10 Pain location: right lateral knee Pain description: aching Aggravating factors:  standing, walking , twisting Relieving factors: rest  PRECAUTIONS: None  WEIGHT BEARING RESTRICTIONS No  FALLS:  Has patient fallen in last 6 months? No  LIVING ENVIRONMENT: Lives with husband  OCCUPATION: retired   PLOF: Independent has not had to use a walker  PATIENT GOALS   To have less pain in her knee and to get back into the gym   OBJECTIVE:   DIAGNOSTIC FINDINGS:  Nothing recent   PATIENT SURVEYS:   COGNITION:  Overall cognitive status: Within functional limits for tasks  assessed     SENSATION: WFL   POSTURE: rounded shoulders and forward head  PALPATION: Trigger points in lower IT band   LOWER EXTREMITY ROM:  Active PROM Right eval Left eval  Hip flexion    Hip extension    Hip abduction    Hip adduction    Hip internal rotation    Hip external rotation    Knee flexion    Knee extension -7   Ankle dorsiflexion    Ankle plantarflexion    Ankle inversion    Ankle eversion     (Blank rows = not tested)  LOWER EXTREMITY MMT:  MMT Right eval Left eval  Hip flexion 17.7  17.8  Hip extension    Hip abduction 11.1 20.4  Hip adduction    Hip internal rotation    Hip external rotation    Knee flexion    Knee extension 18.8 17.3  Ankle dorsiflexion    Ankle plantarflexion    Ankle inversion    Ankle eversion     (Blank rows = not tested)   GAIT: Decreased single leg stance time on the right    TODAY'S TREATMENT: 11/22/ SAQ 3x12  Supine march 3x12  Hip abduction 3x15 red     Manual: Knee extension ROM; trigger point release to lateral knee   Eval: Manual: Knee extension ROM; trigger point release to lateral knee    PATIENT EDUCATION:  Education details: reviewed HEP; symptom management, activity progression  Person educated: Patient Education method: Explanation, Demonstration, Tactile cues, Verbal cues, and Handouts Education comprehension: verbalized understanding, returned demonstration, verbal cues required, tactile cues required, and needs further education   HOME EXERCISE PROGRAM: Access Code: [email or web address] URL: [email or web address] Date: 01/31/2023 Prepared by: Lorayne Bender  Exercises - Supine Heel Slide with Strap  - 1 x daily - 7 x weekly - 3 sets - 5 reps - 5 sec  hold - Supine Quad Set  - 1 x daily - 7 x weekly - 3 sets - 10 reps - Supine Knee Extension Stretch on Towel Roll  - 1 x daily - 7 x weekly - 3 sets - 5 reps - 5 sec  hold  ASSESSMENT:  CLINICAL IMPRESSION: The patient is  doing well. We reviewed exercises that she can do for her hips and knee. She has done some of them in the past. She tolerated well. We will continue to work on her knee extension. We reviewed stretches for her hips..   Eval: Patient is a 72 y.o. female who was seen today for physical therapy evaluation and treatment for right knee pain. She has had improved knee pain and mobility in the past with PT. She returns today with an acute exacerbation of her knee pain.  She continues on limited knee extension but no significant decline from her previous episode.  She has decreased balance with narrow base with eyes closed.  She is working on her pool program on her own.  She would benefit from skilled therapy to improve balance and continue to work on knee extension. OBJECTIVE IMPAIRMENTS Abnormal gait, decreased activity tolerance, decreased endurance, decreased mobility, difficulty walking, decreased ROM, decreased strength, increased fascial restrictions, and pain.   ACTIVITY LIMITATIONS carrying, bending, standing, squatting, stairs, transfers, and locomotion level  PARTICIPATION LIMITATIONS: cleaning, laundry, shopping, community activity, and yard work  PERSONAL FACTORS 3+ comorbidities: Psoriatic arthritis, Bi-polar , Parkinsons  are also affecting patient's functional outcome.   REHAB POTENTIAL: Good  CLINICAL DECISION MAKING: Evolving/moderate complexity  EVALUATION COMPLEXITY: Moderate   GOALS: Goals reviewed with patient? Yes  SHORT TERM GOALS: Target date: 02/20/2023   Patient will increase gross right LE strength by 5 lbs  Baseline: Goal status: INITIAL  2.  Patient will be increase knee extension passive by 3 degrees  Baseline:  Goal status: INITIAL  3.  Patient will be independent with basic HEP  Baseline:  Goal status: INITIAL  LONG TERM GOALS: Target date: 03/20/2023    Patient will stand for 20 min without pain in order to perform her ADL's  Baseline:  Goal status:  INITIAL  2.  Patient will be independent with full gym program  Baseline:  Goal status: INITIAL  3.  Patient will ambulate without increased pain community distances  Baseline:  Goal status: INITIAL    PLAN: PT FREQUENCY: 2x/week  PT DURATION: 8 weeks  PLANNED INTERVENTIONS: Therapeutic exercises, Therapeutic activity, Neuromuscular re-education, Balance training, Gait training, Patient/Family education, Joint mobilization, Stair training, DME instructions, Aquatic Therapy, Dry Needling, Electrical stimulation, Cryotherapy, Moist heat, Ultrasound, and Manual therapy  PLAN FOR NEXT SESSION: continue with manual therapy to the knee; progress to standing exercises; progress back to gym activity as able.    Dessie Coma, PT 01/31/2023, 3:50 PM

## 2023-02-10 ENCOUNTER — Ambulatory Visit (HOSPITAL_BASED_OUTPATIENT_CLINIC_OR_DEPARTMENT_OTHER): Payer: Medicare Other | Admitting: Physical Therapy

## 2023-02-10 ENCOUNTER — Encounter (HOSPITAL_BASED_OUTPATIENT_CLINIC_OR_DEPARTMENT_OTHER): Payer: Self-pay

## 2023-02-10 NOTE — Progress Notes (Signed)
Chief Complaint  Patient presents with   Foot Pain    Patient is here for left foot pain    Subjective: 72 y.o. female presenting to the office today for evaluation of left foot pain.  Idiopathic gradual onset.  Denies a history of injury.  She does have history of surgery to the left fourth toe the.  Pain appears to be associated around the second digit however.   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 1.4 cm  Grade I invasive lobular, ER and PR +/Her--, Ki 67 <5% Tried anastrozole for one month, but developed suicidal idea   Osteoarthritis, knee 09/24/2019   Xray 09/2019   Parkinsonism (HCC)    Drug induced   Polyposis of colon    Psoriatic arthritis (HCC)    PTSD (post-traumatic stress disorder)    Secondary hyperparathyroidism (HCC)    Tardive dyskinesia    Thyroid disease     Past Surgical History:  Procedure Laterality Date   ABDOMINAL HYSTERECTOMY  1987   CHOLECYSTECTOMY  1979   DILATION AND CURETTAGE OF UTERUS  1973   MASTECTOMY Bilateral 09/19/2014   TONSILLECTOMY  1970   URETERAL REIMPLANTION Bilateral 1974    Allergies  Allergen Reactions   Aripiprazole Other (See Comments)    Parkinsonism      Lactose Intolerance (Gi) Diarrhea   Methotrexate Other (See Comments)    Hair loss, severe stomatitis     Nsaids     CKD 4   Cefdinir Diarrhea    Other reaction(s): Diarrhea Yeast infection and fever; negative c diff   Etanercept Other (See Comments)    Headaches     Exemestane Other (See Comments)    Suicidal thoughts with medication     Fluoxetine Other (See Comments)    Parkinsonism    Methylprednisolone Sodium Succ Other (See Comments)    Agitated mania   Other Other (See Comments)    Paper tape causes bruising.   Epinephrine Palpitations    tachycardia    Nitrofurantoin Other (See Comments)     GI upset from 2012     Objective:  Physical Exam General: Alert and oriented x3 in no acute distress  Dermatology: Hyperkeratotic lesion(s) present on the plantar aspect of the MTP of the second ray.  No open wounds noted  Vascular: Palpable pedal pulses bilaterally. No edema or erythema noted. Capillary refill within normal limits.  Neurological: Grossly intact via light touch  Musculoskeletal Exam: There is an tenderness with palpation range of motion to the second MTP of the left foot  Assessment: 1.  Symptomatic benign skin lesion left foot 2.  Second MTP capsulitis left  Plan of Care:  -Patient evaluated - White excisional debridement of keratoic lesion(s) using a chisel blade was performed without incident.  - Recommend good supportive shoes and sneakers that do not constrict the toebox area and support the arch of the foot to alleviate pressure from the forefoot -Return to clinic as needed  Felecia Shelling, DPM [facility]  Dr. Felecia Shelling, DPM    [address], [place] [address]                Office [phone]  Fax [phone]

## 2023-02-13 ENCOUNTER — Encounter (HOSPITAL_BASED_OUTPATIENT_CLINIC_OR_DEPARTMENT_OTHER): Payer: Self-pay | Admitting: Physical Therapy

## 2023-02-13 ENCOUNTER — Ambulatory Visit (HOSPITAL_BASED_OUTPATIENT_CLINIC_OR_DEPARTMENT_OTHER): Payer: Medicare Other | Attending: Physician Assistant | Admitting: Physical Therapy

## 2023-02-13 DIAGNOSIS — R2689 Other abnormalities of gait and mobility: Secondary | ICD-10-CM | POA: Insufficient documentation

## 2023-02-13 DIAGNOSIS — M25561 Pain in right knee: Secondary | ICD-10-CM | POA: Diagnosis present

## 2023-02-13 DIAGNOSIS — M6281 Muscle weakness (generalized): Secondary | ICD-10-CM | POA: Diagnosis present

## 2023-02-13 DIAGNOSIS — G8929 Other chronic pain: Secondary | ICD-10-CM | POA: Diagnosis present

## 2023-02-13 NOTE — Therapy (Signed)
OUTPATIENT PHYSICAL THERAPY LOWER EXTREMITY EVALUATION   Patient Name: Tiffany [name] MRN: [id number] DOB:1950-05-15, 72 y.o., female Today's Date: 02/14/2023   PT End of Session - 02/13/23 1523     Visit Number 3    Number of Visits 16    Date for PT Re-Evaluation 03/20/23    PT Start Time 1515    PT Stop Time 1558    PT Time Calculation (min) 43 min    Activity Tolerance Patient tolerated treatment well    Behavior During Therapy WFL for tasks assessed/performed               Past Medical History:  Diagnosis Date   Bipolar 1 disorder (HCC)    Chronic diarrhea    loose stools twice a day on average for years   CKD stage G4/A1, 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 1.4 cm  Grade I invasive lobular, ER and PR +/Her--, Ki 67 <5% Tried anastrozole for one month, but developed suicidal idea   Osteoarthritis, knee 09/24/2019   Xray 09/2019   Parkinsonism (HCC)    Drug induced   Polyposis of colon    Psoriatic arthritis (HCC)    PTSD (post-traumatic stress disorder)    Secondary hyperparathyroidism (HCC)    Tardive dyskinesia    Thyroid disease    Past Surgical History:  Procedure Laterality Date   ABDOMINAL HYSTERECTOMY  1987   CHOLECYSTECTOMY  1979   DILATION AND CURETTAGE OF UTERUS  1973   MASTECTOMY Bilateral 09/19/2014   TONSILLECTOMY  1970   URETERAL REIMPLANTION Bilateral 1974   Patient Active Problem List   Diagnosis Date Noted   Family history of breast cancer 06/05/2021   Osteoarthritis, knee 09/24/2019   CKD (chronic kidney disease) stage 4, GFR 15-29 ml/min (HCC) 09/24/2019   Severe recurrent major depression without psychotic features (HCC) 09/09/2019   Bipolar I disorder, most recent episode depressed (HCC)    Transaminitis    Encephalopathy  04/26/2019   Leukocytosis 04/21/2019   Thrombocytopenia (HCC) 09/04/2018   Vitamin D deficiency 09/04/2018   Psoriatic arthritis (HCC) 09/04/2018   Tubular adenoma of colon 05/28/2017   History of colonic polyps 05/28/2017   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 09/12/2016   Malignant neoplasm of overlapping sites of left breast in female, estrogen receptor positive (HCC) 03/14/2016   Tardive dyskinesia 10/18/2015   History of breast cancer left 2016 12/27/2014   Osteopenia determined by x-ray 10/31/2014   Rosacea 05/21/2007   Bipolar affective disorder, mixed (HCC) 08/16/2004   Acquired hypothyroidism 04/03/1996    PCP: Jarold Motto, PA  REFERRING PROVIDER: Jarold Motto, PA  REFERRING DIAG:  [phone] - recurrent pain of right knee  THERAPY DIAG:  Other abnormalities of gait and mobility  Muscle weakness (generalized)  Chronic pain of right knee  Rationale for Evaluation and Treatment Rehabilitation  ONSET DATE: 3 weeks ago  SUBJECTIVE:   SUBJECTIVE STATEMENT:  Reports that some mornings when she wakes up she has had sharp pains in her knee.  She reports that sharp pain does not last long as she has to secondary to.  Sometimes she feels like she cannot walk on the knee.  She cannot figure out any pattern to the instances of pain.  PERTINENT HISTORY: Bi-polar, CKD,  depression, breast cancer, OA, Parkinson's, Psoriatic Arthritis, PTSD, tardive diskenesia,   PAIN:  Are you having pain? Yes: NPRS scale: 0/10 Pain location: right lateral knee Pain description: aching Aggravating factors: standing, walking , twisting Relieving factors: rest  PRECAUTIONS: None  WEIGHT BEARING RESTRICTIONS No  FALLS:  Has patient fallen in last 6 months? No  LIVING ENVIRONMENT: Lives with husband  OCCUPATION: retired   PLOF: Independent has not had to use a walker  PATIENT GOALS   To have less pain in her knee and to get back into the  gym   OBJECTIVE:   DIAGNOSTIC FINDINGS:  Nothing recent   PATIENT SURVEYS:   COGNITION:  Overall cognitive status: Within functional limits for tasks assessed     SENSATION: WFL   POSTURE: rounded shoulders and forward head  PALPATION: Trigger points in lower IT band   LOWER EXTREMITY ROM:  Active PROM Right eval Left eval  Hip flexion    Hip extension    Hip abduction    Hip adduction    Hip internal rotation    Hip external rotation    Knee flexion    Knee extension -7   Ankle dorsiflexion    Ankle plantarflexion    Ankle inversion    Ankle eversion     (Blank rows = not tested)  LOWER EXTREMITY MMT:  MMT Right eval Left eval  Hip flexion 17.7  17.8  Hip extension    Hip abduction 11.1 20.4  Hip adduction    Hip internal rotation    Hip external rotation    Knee flexion    Knee extension 18.8 17.3  Ankle dorsiflexion    Ankle plantarflexion    Ankle inversion    Ankle eversion     (Blank rows = not tested)   GAIT: Decreased single leg stance time on the right    TODAY'S TREATMENT:  12/5 Manual: Knee extension ROM; trigger point release to lateral knee   LF hip abduction machine 3x10   Cybex leg press 3 x 15 40 pounds  Quad set x 20 Straight leg raise 3 x 10 Short arc quad 3 x 10   11/22/ SAQ 3x12  Supine march 3x12  Hip abduction 3x15 red     Manual: Knee extension ROM; trigger point release to lateral knee   Eval: Manual: Knee extension ROM; trigger point release to lateral knee    PATIENT EDUCATION:  Education details: reviewed HEP; symptom management, activity progression  Person educated: Patient Education method: Explanation, Demonstration, Tactile cues, Verbal cues, and Handouts Education comprehension: verbalized understanding, returned demonstration, verbal cues required, tactile cues required, and needs further education   HOME EXERCISE PROGRAM: Access Code: [email or web address] URL:  [email or web address] Date: 01/31/2023 Prepared by: Lorayne Bender  Exercises - Supine Heel Slide with Strap  - 1 x daily - 7 x weekly - 3 sets - 5 reps - 5 sec  hold - Supine Quad Set  - 1 x daily - 7 x weekly - 3 sets - 10 reps - Supine Knee Extension Stretch on Towel Roll  - 1 x daily - 7 x weekly - 3 sets - 5 reps - 5 sec  hold  ASSESSMENT:  CLINICAL IMPRESSION: The patient's knee extension was measured at -4 today.  The best her knee extension has been.  She had no significant pain with treatment today.  She did report some fatigue in her quad.  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  She tolerated well.  Therapy encouraged her to continue with her exercises at home.  Will continue to progress as tolerated.  Eval: Patient is a 72 y.o. female who was seen today for physical therapy evaluation and treatment for right knee pain. She has had improved knee pain and mobility in the past with PT. She returns today with an acute exacerbation of her knee pain.  She continues on limited knee extension but no significant decline from her previous episode.  She has decreased balance with narrow base with eyes closed.  She is working on her pool program on her own.  She would benefit from skilled therapy to improve balance and continue to work on knee extension. OBJECTIVE IMPAIRMENTS Abnormal gait, decreased activity tolerance, decreased endurance, decreased mobility, difficulty walking, decreased ROM, decreased strength, increased fascial restrictions, and pain.   ACTIVITY LIMITATIONS carrying, bending, standing, squatting, stairs, transfers, and locomotion level  PARTICIPATION LIMITATIONS: cleaning, laundry, shopping, community activity, and yard work  PERSONAL FACTORS 3+ comorbidities: Psoriatic arthritis, Bi-polar , Parkinsons  are also affecting patient's functional outcome.   REHAB POTENTIAL: Good  CLINICAL DECISION MAKING: Evolving/moderate  complexity  EVALUATION COMPLEXITY: Moderate   GOALS: Goals reviewed with patient? Yes  SHORT TERM GOALS: Target date: 02/20/2023   Patient will increase gross right LE strength by 5 lbs  Baseline: Goal status: INITIAL  2.  Patient will be increase knee extension passive by 3 degrees  Baseline:  Goal status: INITIAL  3.  Patient will be independent with basic HEP  Baseline:  Goal status: INITIAL  LONG TERM GOALS: Target date: 03/20/2023    Patient will stand for 20 min without pain in order to perform her ADL's  Baseline:  Goal status: INITIAL  2.  Patient will be independent with full gym program  Baseline:  Goal status: INITIAL  3.  Patient will ambulate without increased pain community distances  Baseline:  Goal status: INITIAL    PLAN: PT FREQUENCY: 2x/week  PT DURATION: 8 weeks  PLANNED INTERVENTIONS: Therapeutic exercises, Therapeutic activity, Neuromuscular re-education, Balance training, Gait training, Patient/Family education, Joint mobilization, Stair training, DME instructions, Aquatic Therapy, Dry Needling, Electrical stimulation, Cryotherapy, Moist heat, Ultrasound, and Manual therapy  PLAN FOR NEXT SESSION: continue with manual therapy to the knee; progress to standing exercises; progress back to gym activity as able.    Dessie Coma, PT 02/14/2023, 8:50 AM

## 2023-02-14 ENCOUNTER — Encounter (HOSPITAL_BASED_OUTPATIENT_CLINIC_OR_DEPARTMENT_OTHER): Payer: Self-pay | Admitting: Physical Therapy

## 2023-02-20 ENCOUNTER — Encounter (HOSPITAL_BASED_OUTPATIENT_CLINIC_OR_DEPARTMENT_OTHER): Payer: Medicare Other | Admitting: Physical Therapy

## 2023-02-24 ENCOUNTER — Other Ambulatory Visit: Payer: Self-pay | Admitting: Internal Medicine

## 2023-02-24 ENCOUNTER — Encounter: Payer: Self-pay | Admitting: Internal Medicine

## 2023-02-24 DIAGNOSIS — E039 Hypothyroidism, unspecified: Secondary | ICD-10-CM

## 2023-02-25 ENCOUNTER — Other Ambulatory Visit: Payer: Medicare Other

## 2023-02-26 ENCOUNTER — Encounter: Payer: Self-pay | Admitting: Internal Medicine

## 2023-02-26 LAB — T4, FREE: Free T4: 1.2 ng/dL (ref 0.8–1.8)

## 2023-02-26 LAB — TSH: TSH: 3.46 m[IU]/L (ref 0.40–4.50)

## 2023-02-27 ENCOUNTER — Ambulatory Visit (HOSPITAL_BASED_OUTPATIENT_CLINIC_OR_DEPARTMENT_OTHER): Payer: Medicare Other | Admitting: Physical Therapy

## 2023-02-27 ENCOUNTER — Encounter (HOSPITAL_BASED_OUTPATIENT_CLINIC_OR_DEPARTMENT_OTHER): Payer: Self-pay | Admitting: Physical Therapy

## 2023-02-27 DIAGNOSIS — G8929 Other chronic pain: Secondary | ICD-10-CM

## 2023-02-27 DIAGNOSIS — R2689 Other abnormalities of gait and mobility: Secondary | ICD-10-CM | POA: Diagnosis not present

## 2023-02-27 DIAGNOSIS — M6281 Muscle weakness (generalized): Secondary | ICD-10-CM

## 2023-02-27 MED ORDER — LEVOTHYROXINE SODIUM 50 MCG PO TABS: 50.0000 ug | ORAL_TABLET | ORAL | 2 refills | Status: DC

## 2023-02-27 NOTE — Therapy (Signed)
OUTPATIENT PHYSICAL THERAPY LOWER EXTREMITY EVALUATION   Patient Name: Tiffany [name] MRN: [id number] DOB:[birth year], 72 y.o., female Today's Date: 02/28/2023   PT End of Session - 02/27/23 1305     Visit Number 4    Number of Visits 16    Date for PT Re-Evaluation 03/20/23    PT Start Time 1300    PT Stop Time 1340    PT Time Calculation (min) 40 min    Activity Tolerance Patient tolerated treatment well    Behavior During Therapy [facility] for tasks assessed/performed               Past Medical History:  Diagnosis Date   Bipolar 1 disorder (HCC)    Chronic diarrhea    loose stools twice a day on average for years   CKD stage G4/A1, 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 1.4 cm  Grade I invasive lobular, ER and PR +/Her--, Ki 67 <5% Tried anastrozole for one month, but developed suicidal idea   Osteoarthritis, knee 09/24/2019   Xray 09/2019   Parkinsonism (HCC)    Drug induced   Polyposis of colon    Psoriatic arthritis (HCC)    PTSD (post-traumatic stress disorder)    Secondary hyperparathyroidism (HCC)    Tardive dyskinesia    Thyroid disease    Past Surgical History:  Procedure Laterality Date   ABDOMINAL HYSTERECTOMY  1987   CHOLECYSTECTOMY  1979   DILATION AND CURETTAGE OF UTERUS  1973   MASTECTOMY Bilateral 09/19/2014   TONSILLECTOMY  1970   URETERAL REIMPLANTION Bilateral 1974   Patient Active Problem List   Diagnosis Date Noted   Family history of breast cancer 06/05/2021   Osteoarthritis, knee 09/24/2019   CKD (chronic kidney disease) stage 4, GFR 15-29 ml/min (HCC) 09/24/2019   Severe recurrent major depression without psychotic features (HCC) 09/09/2019   Bipolar I disorder, most recent episode depressed (HCC)    Transaminitis    Encephalopathy  04/26/2019   Leukocytosis 04/21/2019   Thrombocytopenia (HCC) 09/04/2018   Vitamin D deficiency 09/04/2018   Psoriatic arthritis (HCC) 09/04/2018   Tubular adenoma of colon 05/28/2017   History of colonic polyps 05/28/2017   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 09/12/2016   Malignant neoplasm of overlapping sites of left breast in female, estrogen receptor positive (HCC) 03/14/2016   Tardive dyskinesia 10/18/2015   History of breast cancer left 2016 12/27/2014   Osteopenia determined by x-ray 10/31/2014   Rosacea 05/21/2007   Bipolar affective disorder, mixed (HCC) 08/16/2004   Acquired hypothyroidism 04/03/1996    PCP: Jarold Motto, PA  REFERRING PROVIDER: Jarold Motto, PA  REFERRING DIAG:  [phone] - recurrent pain of right knee  THERAPY DIAG:  Other abnormalities of gait and mobility  Muscle weakness (generalized)  Chronic pain of right knee  Rationale for Evaluation and Treatment Rehabilitation  ONSET DATE: 3 weeks ago  SUBJECTIVE:   SUBJECTIVE STATEMENT: Patient reports sharp pains are twinges now.  She reports they come and go.  She has not had as much time over the past week or 2 to get into the pool.  PERTINENT HISTORY: Bi-polar, CKD, depression, breast cancer, OA, Parkinson's, Psoriatic Arthritis, PTSD, tardive diskenesia,   PAIN:  Are you having pain? Yes: NPRS scale: 0/10 Pain  location: right lateral knee Pain description: aching Aggravating factors: standing, walking , twisting Relieving factors: rest  PRECAUTIONS: None  WEIGHT BEARING RESTRICTIONS No  FALLS:  Has patient fallen in last 6 months? No  LIVING ENVIRONMENT: Lives with husband  OCCUPATION: retired   PLOF: Independent has not had to use a walker  PATIENT GOALS   To have less pain in her knee and to get back into the gym   OBJECTIVE:   DIAGNOSTIC FINDINGS:  Nothing recent   PATIENT SURVEYS:   COGNITION:  Overall cognitive status:  Within functional limits for tasks assessed     SENSATION: WFL   POSTURE: rounded shoulders and forward head  PALPATION: Trigger points in lower IT band   LOWER EXTREMITY ROM:  Active PROM Right eval Left eval  Hip flexion    Hip extension    Hip abduction    Hip adduction    Hip internal rotation    Hip external rotation    Knee flexion    Knee extension -7   Ankle dorsiflexion    Ankle plantarflexion    Ankle inversion    Ankle eversion     (Blank rows = not tested)  LOWER EXTREMITY MMT:  MMT Right eval Left eval  Hip flexion 17.7  17.8  Hip extension    Hip abduction 11.1 20.4  Hip adduction    Hip internal rotation    Hip external rotation    Knee flexion    Knee extension 18.8 17.3  Ankle dorsiflexion    Ankle plantarflexion    Ankle inversion    Ankle eversion     (Blank rows = not tested)   GAIT: Decreased single leg stance time on the right    TODAY'S TREATMENT: 12/19 Manual: Knee extension ROM; trigger point release to lateral knee   LF hip abduction machine 3x10   Cybex leg press 3 x 15 40 pounds  Row machine 3x12 15 lbs   Narrow base air-ex 2x30 sec eyes closed   Tandem stance on air-ex 2x30 sec hold     12/5 Manual: Knee extension ROM; trigger point release to lateral knee   LF hip abduction machine 3x10   Cybex leg press 3 x 15 40 pounds  Quad set x 20 Straight leg raise 3 x 10 Short arc quad 3 x 10   11/22/ SAQ 3x12  Supine march 3x12  Hip abduction 3x15 red     Manual: Knee extension ROM; trigger point release to lateral knee   Eval: Manual: Knee extension ROM; trigger point release to lateral knee    PATIENT EDUCATION:  Education details: reviewed HEP; symptom management, activity progression  Person educated: Patient Education method: Explanation, Demonstration, Tactile cues, Verbal cues, and Handouts Education comprehension: verbalized understanding, returned demonstration, verbal cues required,  tactile cues required, and needs further education   HOME EXERCISE PROGRAM: Access Code: [email or web address] URL: [email or web address] Date: 01/31/2023 Prepared by: Lorayne Bender  Exercises - Supine Heel Slide with Strap  - 1 x daily - 7 x weekly - 3 sets - 5 reps - 5 sec  hold - Supine Quad Set  - 1 x daily - 7 x weekly - 3 sets - 10 reps - Supine Knee Extension Stretch on Towel Roll  - 1 x daily - 7 x weekly - 3 sets - 5 reps - 5 sec  hold  ASSESSMENT:  CLINICAL IMPRESSION: The patient tolerated treatment well. Her knee extension is about the same  We reviewed gym  exercises again. She did very well. She was able to set up the machines with min cuing. We also reviewed balance exercises. She was shown where the balance mat is in the gym. We will continue to work on balance and expand her gym program.   Eval: Patient is a 72 y.o. female who was seen today for physical therapy evaluation and treatment for right knee pain. She has had improved knee pain and mobility in the past with PT. She returns today with an acute exacerbation of her knee pain.  She continues on limited knee extension but no significant decline from her previous episode.  She has decreased balance with narrow base with eyes closed.  She is working on her pool program on her own.  She would benefit from skilled therapy to improve balance and continue to work on knee extension. OBJECTIVE IMPAIRMENTS Abnormal gait, decreased activity tolerance, decreased endurance, decreased mobility, difficulty walking, decreased ROM, decreased strength, increased fascial restrictions, and pain.   ACTIVITY LIMITATIONS carrying, bending, standing, squatting, stairs, transfers, and locomotion level  PARTICIPATION LIMITATIONS: cleaning, laundry, shopping, community activity, and yard work  PERSONAL FACTORS 3+ comorbidities: Psoriatic arthritis, Bi-polar , Parkinsons  are also affecting patient's functional outcome.   REHAB POTENTIAL:  Good  CLINICAL DECISION MAKING: Evolving/moderate complexity  EVALUATION COMPLEXITY: Moderate   GOALS: Goals reviewed with patient? Yes  SHORT TERM GOALS: Target date: 02/20/2023   Patient will increase gross right LE strength by 5 lbs  Baseline: Goal status: INITIAL  2.  Patient will be increase knee extension passive by 3 degrees  Baseline:  Goal status: INITIAL  3.  Patient will be independent with basic HEP  Baseline:  Goal status: INITIAL  LONG TERM GOALS: Target date: 03/20/2023    Patient will stand for 20 min without pain in order to perform her ADL's  Baseline:  Goal status: INITIAL  2.  Patient will be independent with full gym program  Baseline:  Goal status: INITIAL  3.  Patient will ambulate without increased pain community distances  Baseline:  Goal status: INITIAL    PLAN: PT FREQUENCY: 2x/week  PT DURATION: 8 weeks  PLANNED INTERVENTIONS: Therapeutic exercises, Therapeutic activity, Neuromuscular re-education, Balance training, Gait training, Patient/Family education, Joint mobilization, Stair training, DME instructions, Aquatic Therapy, Dry Needling, Electrical stimulation, Cryotherapy, Moist heat, Ultrasound, and Manual therapy  PLAN FOR NEXT SESSION: continue with manual therapy to the knee; progress to standing exercises; progress back to gym activity as able.    Dessie Coma, PT 02/28/2023, 8:32 AM

## 2023-03-07 ENCOUNTER — Encounter (HOSPITAL_BASED_OUTPATIENT_CLINIC_OR_DEPARTMENT_OTHER): Payer: Medicare Other | Admitting: Physical Therapy

## 2023-03-13 ENCOUNTER — Ambulatory Visit (HOSPITAL_BASED_OUTPATIENT_CLINIC_OR_DEPARTMENT_OTHER): Payer: Medicare Other | Attending: Physician Assistant | Admitting: Physical Therapy

## 2023-03-13 ENCOUNTER — Encounter (HOSPITAL_BASED_OUTPATIENT_CLINIC_OR_DEPARTMENT_OTHER): Payer: Self-pay | Admitting: Physical Therapy

## 2023-03-13 DIAGNOSIS — G8929 Other chronic pain: Secondary | ICD-10-CM | POA: Insufficient documentation

## 2023-03-13 DIAGNOSIS — R2689 Other abnormalities of gait and mobility: Secondary | ICD-10-CM | POA: Diagnosis present

## 2023-03-13 DIAGNOSIS — M6281 Muscle weakness (generalized): Secondary | ICD-10-CM | POA: Insufficient documentation

## 2023-03-13 DIAGNOSIS — R2681 Unsteadiness on feet: Secondary | ICD-10-CM | POA: Diagnosis present

## 2023-03-13 DIAGNOSIS — M25561 Pain in right knee: Secondary | ICD-10-CM | POA: Insufficient documentation

## 2023-03-13 NOTE — Therapy (Signed)
 OUTPATIENT PHYSICAL THERAPY LOWER EXTREMITY EVALUATION   Patient Name: Tiffany [name] MRN: [id number] DOB:1950/11/02, 73 y.o., female Today's Date: 03/13/2023       Past Medical History:  Diagnosis Date   Bipolar 1 disorder (HCC)    Chronic diarrhea    loose stools twice a day on average for years   CKD stage G4/A1, 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 1.4 cm  Grade I invasive lobular, ER and PR +/Her--, Ki 67 <5% Tried anastrozole for one month, but developed suicidal idea   Osteoarthritis, knee 09/24/2019   Xray 09/2019   Parkinsonism (HCC)    Drug induced   Polyposis of colon    Psoriatic arthritis (HCC)    PTSD (post-traumatic stress disorder)    Secondary hyperparathyroidism (HCC)    Tardive dyskinesia    Thyroid  disease    Past Surgical History:  Procedure Laterality Date   ABDOMINAL HYSTERECTOMY  1987   CHOLECYSTECTOMY  1979   DILATION AND CURETTAGE OF UTERUS  1973   MASTECTOMY Bilateral 09/19/2014   TONSILLECTOMY  1970   URETERAL REIMPLANTION Bilateral 1974   Patient Active Problem List   Diagnosis Date Noted   Family history of breast cancer 06/05/2021   Osteoarthritis, knee 09/24/2019   CKD (chronic kidney disease) stage 4, GFR 15-29 ml/min (HCC) 09/24/2019   Severe recurrent major depression without psychotic features (HCC) 09/09/2019   Bipolar I disorder, most recent episode depressed (HCC)    Transaminitis    Encephalopathy 04/26/2019   Leukocytosis 04/21/2019   Thrombocytopenia (HCC) 09/04/2018   Vitamin D  deficiency 09/04/2018   Psoriatic arthritis (HCC) 09/04/2018   Tubular adenoma of colon 05/28/2017   History of colonic polyps 05/28/2017   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  Rationale for Evaluation and Treatment Rehabilitation  ONSET DATE: 3 weeks ago  SUBJECTIVE:   SUBJECTIVE STATEMENT: The patient has had intermittent pain over the past few days. Today she began having pain in the back.    PERTINENT HISTORY: Bi-polar, CKD, depression, breast cancer, OA, Parkinson's, Psoriatic Arthritis, PTSD, tardive diskenesia,   PAIN:  Are you having pain? Yes: NPRS scale: 2-3/10 pain  Pain location: right lateral knee Pain description: aching Aggravating factors: standing, walking , twisting Relieving factors: rest  PRECAUTIONS: None  WEIGHT BEARING RESTRICTIONS No  FALLS:  Has patient fallen in last 6 months? No  LIVING ENVIRONMENT: Lives with husband  OCCUPATION: retired   PLOF: Independent has not had to use a walker  PATIENT GOALS   To have less pain in her knee and to get back into the gym   OBJECTIVE:   DIAGNOSTIC FINDINGS:  Nothing recent   PATIENT SURVEYS:   COGNITION:  Overall cognitive status: Within functional limits for tasks assessed  SENSATION: WFL   POSTURE: rounded shoulders and forward head  PALPATION: Trigger points in lower IT band   LOWER EXTREMITY ROM:  Active PROM Right eval Left eval  Hip flexion    Hip extension    Hip abduction    Hip adduction    Hip internal rotation    Hip external rotation    Knee flexion    Knee extension -7   Ankle dorsiflexion    Ankle plantarflexion    Ankle inversion    Ankle  eversion     (Blank rows = not tested)  LOWER EXTREMITY MMT:  MMT Right eval Left eval  Hip flexion 17.7  17.8  Hip extension    Hip abduction 11.1 20.4  Hip adduction    Hip internal rotation    Hip external rotation    Knee flexion    Knee extension 18.8 17.3  Ankle dorsiflexion    Ankle plantarflexion    Ankle inversion    Ankle eversion     (Blank rows = not tested)   GAIT: Decreased single leg stance time on the right    TODAY'S TREATMENT: 1/2  Manual: Knee extension ROM; trigger point release to lateral / medial knee   Quad set 2x15  Hip abduction 3x15 red  Bridge 2x10   LF row machine 2x15 20 lbs  Triceps press down 2x15 20 lbs   12/19 Manual: Knee extension ROM; trigger point release to lateral knee   LF hip abduction machine 3x10   Cybex leg press 3 x 15 40 pounds  Row machine 3x12 15 lbs   Narrow base air-ex 2x30 sec eyes closed   Tandem stance on air-ex 2x30 sec hold     12/5 Manual: Knee extension ROM; trigger point release to lateral knee   LF hip abduction machine 3x10   Cybex leg press 3 x 15 40 pounds  Quad set x 20 Straight leg raise 3 x 10 Short arc quad 3 x 10   11/22/ SAQ 3x12  Supine march 3x12  Hip abduction 3x15 red     Manual: Knee extension ROM; trigger point release to lateral knee   Eval: Manual: Knee extension ROM; trigger point release to lateral knee    PATIENT EDUCATION:  Education details: reviewed HEP; symptom management, activity progression  Person educated: Patient Education method: Explanation, Demonstration, Tactile cues, Verbal cues, and Handouts Education comprehension: verbalized understanding, returned demonstration, verbal cues required, tactile cues required, and needs further education   HOME EXERCISE PROGRAM: Access Code: [email or web address] URL: [email or web address] Date: 01/31/2023 Prepared by: Alm Don  Exercises - Supine Heel Slide with Strap  - 1 x daily - 7 x  weekly - 3 sets - 5 reps - 5 sec  hold - Supine Quad Set  - 1 x daily - 7 x weekly - 3 sets - 10 reps - Supine Knee Extension Stretch on Towel Roll  - 1 x daily - 7 x weekly - 3 sets - 5 reps - 5 sec  hold  ASSESSMENT:  CLINICAL IMPRESSION: Therapy focused on manual therapy to the knee. It was still sore following. We worked on light exercises in supine. After going from supine to sit she became slightly syncopal. She felt better after sitting for a minute. We worked on UE exercises in the gym. She was cued to work on breathing and posture with her UE exercises.  Eval: Patient is a 73 y.o. female who was seen today for physical therapy evaluation and treatment  for right knee pain. She has had improved knee pain and mobility in the past with PT. She returns today with an acute exacerbation of her knee pain.  She continues on limited knee extension but no significant decline from her previous episode.  She has decreased balance with narrow base with eyes closed.  She is working on her pool program on her own.  She would benefit from skilled therapy to improve balance and continue to work on knee extension. OBJECTIVE IMPAIRMENTS Abnormal gait, decreased activity tolerance, decreased endurance, decreased mobility, difficulty walking, decreased ROM, decreased strength, increased fascial restrictions, and pain.   ACTIVITY LIMITATIONS carrying, bending, standing, squatting, stairs, transfers, and locomotion level  PARTICIPATION LIMITATIONS: cleaning, laundry, shopping, community activity, and yard work  PERSONAL FACTORS 3+ comorbidities: Psoriatic arthritis, Bi-polar , Parkinsons  are also affecting patient's functional outcome.   REHAB POTENTIAL: Good  CLINICAL DECISION MAKING: Evolving/moderate complexity  EVALUATION COMPLEXITY: Moderate   GOALS: Goals reviewed with patient? Yes  SHORT TERM GOALS: Target date: 02/20/2023   Patient will increase gross right LE strength by 5 lbs   Baseline: Goal status: INITIAL  2.  Patient will be increase knee extension passive by 3 degrees  Baseline:  Goal status: INITIAL  3.  Patient will be independent with basic HEP  Baseline:  Goal status: INITIAL  LONG TERM GOALS: Target date: 03/20/2023    Patient will stand for 20 min without pain in order to perform her ADL's  Baseline:  Goal status: INITIAL  2.  Patient will be independent with full gym program  Baseline:  Goal status: INITIAL  3.  Patient will ambulate without increased pain community distances  Baseline:  Goal status: INITIAL    PLAN: PT FREQUENCY: 2x/week  PT DURATION: 8 weeks  PLANNED INTERVENTIONS: Therapeutic exercises, Therapeutic activity, Neuromuscular re-education, Balance training, Gait training, Patient/Family education, Joint mobilization, Stair training, DME instructions, Aquatic Therapy, Dry Needling, Electrical stimulation, Cryotherapy, Moist heat, Ultrasound, and Manual therapy  PLAN FOR NEXT SESSION: continue with manual therapy to the knee; progress to standing exercises; progress back to gym activity as able.    Alm JINNY Don, PT 03/13/2023, 2:35 PM

## 2023-03-20 ENCOUNTER — Encounter (HOSPITAL_BASED_OUTPATIENT_CLINIC_OR_DEPARTMENT_OTHER): Payer: Self-pay | Admitting: Physical Therapy

## 2023-03-20 ENCOUNTER — Ambulatory Visit (HOSPITAL_BASED_OUTPATIENT_CLINIC_OR_DEPARTMENT_OTHER): Payer: Medicare Other | Admitting: Physical Therapy

## 2023-03-20 DIAGNOSIS — R2689 Other abnormalities of gait and mobility: Secondary | ICD-10-CM | POA: Diagnosis not present

## 2023-03-20 DIAGNOSIS — M6281 Muscle weakness (generalized): Secondary | ICD-10-CM

## 2023-03-20 DIAGNOSIS — G8929 Other chronic pain: Secondary | ICD-10-CM

## 2023-03-21 ENCOUNTER — Encounter (HOSPITAL_BASED_OUTPATIENT_CLINIC_OR_DEPARTMENT_OTHER): Payer: Self-pay | Admitting: Physical Therapy

## 2023-03-21 NOTE — Therapy (Signed)
 OUTPATIENT PHYSICAL THERAPY LOWER EXTREMITY EVALUATION   Patient Name: Tiffany [name] MRN: [id number] DOB:12-12-1950, 73 y.o., female Today's Date: 03/21/2023   PT End of Session - 03/20/23 1521     Visit Number 6    Number of Visits 16    Date for PT Re-Evaluation 03/20/23    PT Start Time 1515    PT Stop Time 1558    PT Time Calculation (min) 43 min    Activity Tolerance Patient tolerated treatment well    Behavior During Therapy [facility] for tasks assessed/performed                Past Medical History:  Diagnosis Date   Bipolar 1 disorder (HCC)    Chronic diarrhea    loose stools twice a day on average for years   CKD stage G4/A1, 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 1.4 cm  Grade I invasive lobular, ER and PR +/Her--, Ki 67 <5% Tried anastrozole for one month, but developed suicidal idea   Osteoarthritis, knee 09/24/2019   Xray 09/2019   Parkinsonism (HCC)    Drug induced   Polyposis of colon    Psoriatic arthritis (HCC)    PTSD (post-traumatic stress disorder)    Secondary hyperparathyroidism (HCC)    Tardive dyskinesia    Thyroid  disease    Past Surgical History:  Procedure Laterality Date   ABDOMINAL HYSTERECTOMY  1987   CHOLECYSTECTOMY  1979   DILATION AND CURETTAGE OF UTERUS  1973   MASTECTOMY Bilateral 09/19/2014   TONSILLECTOMY  1970   URETERAL REIMPLANTION Bilateral 1974   Patient Active Problem List   Diagnosis Date Noted   Family history of breast cancer 06/05/2021   Osteoarthritis, knee 09/24/2019   CKD (chronic kidney disease) stage 4, GFR 15-29 ml/min (HCC) 09/24/2019   Severe recurrent major depression without psychotic features (HCC) 09/09/2019   Bipolar I disorder, most recent episode depressed (HCC)    Transaminitis    Encephalopathy  04/26/2019   Leukocytosis 04/21/2019   Thrombocytopenia (HCC) 09/04/2018   Vitamin D  deficiency 09/04/2018   Psoriatic arthritis (HCC) 09/04/2018   Tubular adenoma of colon 05/28/2017   History of colonic polyps 05/28/2017   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 09/12/2016   Malignant neoplasm of overlapping sites of left breast in female, estrogen receptor positive (HCC) 03/14/2016   Tardive dyskinesia 10/18/2015   History of breast cancer left 2016 12/27/2014   Osteopenia determined by x-ray 10/31/2014   Rosacea 05/21/2007   Bipolar affective disorder, mixed (HCC) 08/16/2004   Acquired hypothyroidism 04/03/1996    PCP: Job Lukes, PA  REFERRING PROVIDER: Job Lukes, PA  REFERRING DIAG:  [phone] - recurrent pain of right knee  THERAPY DIAG:  Other abnormalities of gait and mobility  Muscle weakness (generalized)  Chronic pain of right knee  Rationale for Evaluation and Treatment Rehabilitation  ONSET DATE: 3 weeks ago  SUBJECTIVE:   SUBJECTIVE STATEMENT: Patient reports her knee is still good.  She does have very little pain.  She does have pain of her lateral hip at times.   PERTINENT HISTORY: Bi-polar, CKD, depression, breast cancer, OA, Parkinson's, Psoriatic Arthritis, PTSD, tardive diskenesia,   PAIN:  Are you having pain? Yes: NPRS scale: 2-3/10 pain  Pain location: right lateral knee  Pain description: aching Aggravating factors: standing, walking , twisting Relieving factors: rest  PRECAUTIONS: None  WEIGHT BEARING RESTRICTIONS No  FALLS:  Has patient fallen in last 6 months? No  LIVING ENVIRONMENT: Lives with husband  OCCUPATION: retired   PLOF: Independent has not had to use a walker  PATIENT GOALS   To have less pain in her knee and to get back into the gym   OBJECTIVE:   DIAGNOSTIC FINDINGS:  Nothing recent   PATIENT SURVEYS:   COGNITION:  Overall cognitive status: Within functional limits  for tasks assessed     SENSATION: WFL   POSTURE: rounded shoulders and forward head  PALPATION: Trigger points in lower IT band   LOWER EXTREMITY ROM:  Active PROM Right eval Left eval  Hip flexion    Hip extension    Hip abduction    Hip adduction    Hip internal rotation    Hip external rotation    Knee flexion    Knee extension -7   Ankle dorsiflexion    Ankle plantarflexion    Ankle inversion    Ankle eversion     (Blank rows = not tested)  LOWER EXTREMITY MMT:  MMT Right eval Left eval  Hip flexion 17.7  17.8  Hip extension    Hip abduction 11.1 20.4  Hip adduction    Hip internal rotation    Hip external rotation    Knee flexion    Knee extension 18.8 17.3  Ankle dorsiflexion    Ankle plantarflexion    Ankle inversion    Ankle eversion     (Blank rows = not tested)   GAIT: Decreased single leg stance time on the right    TODAY'S TREATMENT: 1/09  Manual: Knee extension ROM; trigger point release to lateral / medial knee and IT band   LF hip abduction machine 3x10 55 lbs   Row machine 3x12 15 lbs   Triceps pushdown 3x12 20 lbs   Step down 2x10 each leg  Step up 2 inch 2x10    1/2  Manual: Knee extension ROM; trigger point release to lateral / medial knee   Quad set 2x15  Hip abduction 3x15 red  Bridge 2x10   LF row machine 2x15 20 lbs  Triceps press down 2x15 20 lbs   12/19 Manual: Knee extension ROM; trigger point release to lateral knee   LF hip abduction machine 3x10   Cybex leg press 3 x 15 40 pounds  Row machine 3x12 15 lbs   Narrow base air-ex 2x30 sec eyes closed   Tandem stance on air-ex 2x30 sec hold     12/5 Manual: Knee extension ROM; trigger point release to lateral knee   LF hip abduction machine 3x10   Cybex leg press 3 x 15 40 pounds  Quad set x 20 Straight leg raise 3 x 10 Short arc quad 3 x 10   11/22/ SAQ 3x12  Supine march 3x12  Hip abduction 3x15 red     Manual: Knee extension  ROM; trigger point release to lateral knee   Eval: Manual: Knee extension ROM; trigger point release to lateral knee    PATIENT EDUCATION:  Education details: reviewed HEP; symptom management, activity progression  Person educated: Patient Education method: Explanation, Demonstration, Tactile cues, Verbal cues, and Handouts Education comprehension: verbalized understanding, returned demonstration, verbal cues required, tactile cues required, and needs further education   HOME EXERCISE PROGRAM: Access Code: [email or web address] URL: [email or web address] Date: 01/31/2023 Prepared by: Alm Don  Exercises - Supine Heel Slide with Strap  - 1 x daily - 7 x weekly - 3 sets - 5 reps - 5 sec  hold - Supine Quad Set  - 1 x daily - 7 x weekly - 3 sets - 10 reps - Supine Knee Extension Stretch on Towel Roll  - 1 x daily - 7 x weekly - 3 sets - 5 reps - 5 sec  hold  ASSESSMENT:  CLINICAL IMPRESSION: Therapy performed manual therapy to IT band.  She had several trigger point Supple and IT band.  We reviewed self soft tissue mobilization to IT band.  We continue to review gym exercises.  She reports some difficulty going downstairs.  We showed her how she can training this in the gym.  Therapy will continue to progress as tolerated.  See below for goal specific progress.   Eval: Patient is a 73 y.o. female who was seen today for physical therapy evaluation and treatment for right knee pain. She has had improved knee pain and mobility in the past with PT. She returns today with an acute exacerbation of her knee pain.  She continues on limited knee extension but no significant decline from her previous episode.  She has decreased balance with narrow base with eyes closed.  She is working on her pool program on her own.  She would benefit from skilled therapy to improve balance and continue to work on knee extension. OBJECTIVE IMPAIRMENTS Abnormal gait, decreased activity tolerance, decreased  endurance, decreased mobility, difficulty walking, decreased ROM, decreased strength, increased fascial restrictions, and pain.   ACTIVITY LIMITATIONS carrying, bending, standing, squatting, stairs, transfers, and locomotion level  PARTICIPATION LIMITATIONS: cleaning, laundry, shopping, community activity, and yard work  PERSONAL FACTORS 3+ comorbidities: Psoriatic arthritis, Bi-polar , Parkinsons  are also affecting patient's functional outcome.   REHAB POTENTIAL: Good  CLINICAL DECISION MAKING: Evolving/moderate complexity  EVALUATION COMPLEXITY: Moderate   GOALS: Goals reviewed with patient? Yes  SHORT TERM GOALS: Target date: 05/02/2023     Patient will increase gross right LE strength by 5 lbs  Baseline: Goal status: INITIAL  2.  Patient will be increase knee extension passive by 3 degrees  Baseline:  Goal status: Progressing 1/10  3.  Patient will be independent with basic HEP  Baseline:  Goal status: INITIAL  LONG TERM GOALS: Target date: 03/20/2023    Patient will stand for 20 min without pain in order to perform her ADL's  Baseline:  Goal status: Achieved 1/20  2.  Patient will be independent with full gym program  Baseline:  Goal status: Progressing 1/9  3.  Patient will ambulate without increased pain community distances  Baseline:  Goal status: INITIAL    PLAN: PT FREQUENCY: 2x/week  PT DURATION: 8 weeks  PLANNED INTERVENTIONS: Therapeutic exercises, Therapeutic activity, Neuromuscular re-education, Balance training, Gait training, Patient/Family education, Joint mobilization, Stair training, DME instructions, Aquatic Therapy, Dry Needling, Electrical stimulation, Cryotherapy, Moist heat, Ultrasound, and Manual therapy  PLAN FOR NEXT SESSION: continue with manual therapy to the knee; progress to standing exercises; progress back to gym activity as able.    Alm JINNY Don, PT 03/21/2023, 7:00 AM

## 2023-03-26 ENCOUNTER — Ambulatory Visit (INDEPENDENT_AMBULATORY_CARE_PROVIDER_SITE_OTHER): Payer: Medicare Other | Admitting: Family

## 2023-03-26 ENCOUNTER — Encounter: Payer: Self-pay | Admitting: Family

## 2023-03-26 VITALS — BP 117/73 | HR 60 | Temp 97.3°F | Ht 64.0 in | Wt 179.2 lb

## 2023-03-26 DIAGNOSIS — I7300 Raynaud's syndrome without gangrene: Secondary | ICD-10-CM | POA: Insufficient documentation

## 2023-03-26 NOTE — Patient Instructions (Signed)
 It was very nice to see you today!   I believe your symptoms are related to Raynaud's phenomenon. Keep your hands warm with gloves and hand warmers as needed (can look for handheld warmers or gloves that warm your hands). Same for your feet, wear warm socks. See the handout attached for more tips on controlling symptoms.      PLEASE NOTE:  If you had any lab tests please let us  know if you have not heard back within a few days. You may see your results on MyChart before we have a chance to review them but we will give you a call once they are reviewed by us . If we ordered any referrals today, please let us  know if you have not heard from their office within the next week.

## 2023-03-26 NOTE — Progress Notes (Signed)
 Patient ID: Tiffany [name], female    DOB: 1950/10/01, 73 y.o.   MRN: [id number]  Chief Complaint  Patient presents with   Numbness    Pt c/o numbness in index fingers on bilateral hands with color change and sometimes full hand is numb. Toes have pins and needles sensations but no color change.         Discussed the use of AI scribe software for clinical note transcription with the patient, who gave verbal consent to proceed.  History of Present Illness   The patient presents with new onset numbness in her fingers, which she describes as turning white and cold, with the right hand being more affected than the left. The numbness lasts for about twenty minutes but has been lasting longer recently. The patient also reports a tingling sensation and pins and needles in her toes. She has not noticed any color changes in her toes. The patient has a history of similar symptoms, but not to this extent. She has been trying to keep her hands warm by wearing gloves indoors and briefly placing them under warm water. The patient has a history of rheumatoid and osteoarthritis, thyroid  issues, and is on amlodipine  for blood pressure.     Assessment & Plan:     Raynaud's Phenomenon - New onset of finger numbness, color changes (white and purple), and coldness, predominantly in the right hand. Symptoms occur indoors and are not associated with long outdoor exposure. Patient also reports tingling in toes. Patient has a history of rheumatoid arthritis, osteoarthritis, and thyroid  issues, which may increase risk for Raynaud's, also we have been experiencing colder than usual temperatures outside. -Continue to keep hands warm, consider using electric hand warmers or gloves with rechargeable electric heat, even while indoors. -Consider using mild compression gloves. -Continue current medications, including amlodipine  which is providing some benefit for this as well as HTN. -If symptoms persist for hours or worsen,  patient should contact the office.      Subjective:    Outpatient Medications Prior to Visit  Medication Sig Dispense Refill   amLODipine  (NORVASC ) 5 MG tablet TAKE 1 TABLET(5 MG) BY MOUTH DAILY 90 tablet 1   atorvastatin  (LIPITOR) 10 MG tablet TAKE 1 TABLET(10 MG) BY MOUTH DAILY 90 tablet 1   augmented betamethasone dipropionate (DIPROLENE-AF) 0.05 % cream APPLY TO THE AFFECTED AREAS OF LEG TWICE DAILY AS NEEDED FOR RASH. NOT TO FACE, GROIN, UNDERARMS 50 g 0   cariprazine (VRAYLAR) 1.5 MG capsule TAKE 1 CAPSULE BY MOUTH EVERY MORNING Oral for 30 days     Cholecalciferol  50 MCG (2000 UT) TABS 1 tablet Orally Once a day for 30 day(s)     docusate sodium (COLACE) 100 MG capsule Take 100 mg by mouth 2 (two) times daily.     Golimumab (SIMPONI ARIA IV) Infusion every 8 weeks     ipratropium (ATROVENT ) 0.03 % nasal spray Place 2 sprays into both nostrils every 12 (twelve) hours. 30 mL 12   levothyroxine  (SYNTHROID ) 50 MCG tablet Take 1 tablet (50 mcg total) by mouth as directed. Take 2 tabs on Sundays and Wednesday,  1 tab rest of the week 117 tablet 2   metoprolol  succinate (TOPROL  XL) 25 MG 24 hr tablet Take 0.5 tablets (12.5 mg total) by mouth daily. 45 tablet 3   metroNIDAZOLE  (METROGEL ) 0.75 % gel Apply to face 1-2 times daily 45 g 1   mirtazapine (REMERON) 45 MG tablet Take 45 mg by mouth at bedtime.  Omega-3 Fatty Acids (FISH OIL) 1200 MG CAPS Take 1 capsule by mouth in the morning and at bedtime.     Probiotic Product (ALIGN) 4 MG CAPS Take 1 capsule by mouth daily in the afternoon.     hydrOXYzine  (ATARAX ) 10 MG tablet Take by mouth.     LORazepam  (ATIVAN ) 0.5 MG tablet Take 0.5 mg by mouth 3 (three) times daily as needed.     lurasidone  (LATUDA ) 80 MG TABS tablet      Lurasidone  HCl 60 MG TABS Take by mouth.     No facility-administered medications prior to visit.   Past Medical History:  Diagnosis Date   Bipolar 1 disorder (HCC)    Chronic diarrhea    loose stools twice a day  on average for years   CKD stage G4/A1, 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 1.4 cm  Grade I invasive lobular, ER and PR +/Her--, Ki 67 <5% Tried anastrozole for one month, but developed suicidal idea   Osteoarthritis, knee 09/24/2019   Xray 09/2019   Parkinsonism (HCC)    Drug induced   Polyposis of colon    Psoriatic arthritis (HCC)    PTSD (post-traumatic stress disorder)    Secondary hyperparathyroidism (HCC)    Tardive dyskinesia    Thyroid  disease    Past Surgical History:  Procedure Laterality Date   ABDOMINAL HYSTERECTOMY  1987   CHOLECYSTECTOMY  1979   DILATION AND CURETTAGE OF UTERUS  1973   MASTECTOMY Bilateral 09/19/2014   TONSILLECTOMY  1970   URETERAL REIMPLANTION Bilateral 1974   Allergies  Allergen Reactions   Aripiprazole Other (See Comments)    Parkinsonism      Lactose Intolerance (Gi) Diarrhea   Methotrexate Other (See Comments)    Hair loss, severe stomatitis     Nsaids     CKD 4   Cefdinir Diarrhea    Other reaction(s): Diarrhea Yeast infection and fever; negative c diff   Etanercept Other (See Comments)    Headaches     Exemestane Other (See Comments)    Suicidal thoughts with medication     Fluoxetine Other (See Comments)    Parkinsonism   Methylprednisolone  Sodium Succ Other (See Comments)    Agitated mania   Other Other (See Comments)    Paper tape causes bruising.   Epinephrine  Palpitations    tachycardia    Nitrofurantoin Other (See Comments)     GI upset from 2012      Objective:    Physical Exam Vitals and nursing note reviewed.  Constitutional:      Appearance: Normal appearance.  Cardiovascular:     Rate and Rhythm: Normal rate and regular rhythm.     Pulses: Normal pulses.  Pulmonary:     Effort: Pulmonary  effort is normal.     Breath sounds: Normal breath sounds.  Musculoskeletal:        General: Normal range of motion.  Skin:    General: Skin is warm and dry.     Comments: no finger blanching or purple discoloration noted during visit  Neurological:     Mental Status: She is alert.  Psychiatric:        Mood and Affect: Mood normal.        Behavior: Behavior normal.  BP 117/73 (BP Location: Left Arm, Patient Position: Sitting, Cuff Size: Normal)   Pulse 60   Temp (!) 97.3 F (36.3 C) (Temporal)   Ht 5\' 4"  (1.626 m)   Wt 179 lb 3.2 oz (81.3 kg)   SpO2 98%   BMI 30.76 kg/m  Wt Readings from Last 3 Encounters:  03/26/23 179 lb 3.2 oz (81.3 kg)  01/27/23 173 lb (78.5 kg)  12/30/22 173 lb 3.2 oz (78.6 kg)      Versa Gore, NP

## 2023-03-28 ENCOUNTER — Ambulatory Visit (HOSPITAL_BASED_OUTPATIENT_CLINIC_OR_DEPARTMENT_OTHER): Payer: Medicare Other | Admitting: Physical Therapy

## 2023-03-28 ENCOUNTER — Encounter (HOSPITAL_BASED_OUTPATIENT_CLINIC_OR_DEPARTMENT_OTHER): Payer: Self-pay | Admitting: Physical Therapy

## 2023-03-28 DIAGNOSIS — R2689 Other abnormalities of gait and mobility: Secondary | ICD-10-CM

## 2023-03-28 DIAGNOSIS — M6281 Muscle weakness (generalized): Secondary | ICD-10-CM

## 2023-03-28 DIAGNOSIS — G8929 Other chronic pain: Secondary | ICD-10-CM

## 2023-03-28 NOTE — Therapy (Signed)
OUTPATIENT PHYSICAL THERAPY LOWER EXTREMITY EVALUATION   Patient Name: Tiffany [name] MRN: [id number] DOB:[birth year], 73 y.o., female Today's Date: 03/28/2023   PT End of Session - 03/28/23 1020     Visit Number 7    Number of Visits 16    Date for PT Re-Evaluation 03/20/23    PT Start Time 1015    PT Stop Time 1057    PT Time Calculation (min) 42 min    Activity Tolerance Patient tolerated treatment well    Behavior During Therapy WFL for tasks assessed/performed                Past Medical History:  Diagnosis Date   Bipolar 1 disorder (HCC)    Chronic diarrhea    loose stools twice a day on average for years   CKD stage G4/A1, 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 1.4 cm  Grade I invasive lobular, ER and PR +/Her--, Ki 67 <5% Tried anastrozole for one month, but developed suicidal idea   Osteoarthritis, knee 09/24/2019   Xray 09/2019   Parkinsonism (HCC)    Drug induced   Polyposis of colon    Psoriatic arthritis (HCC)    PTSD (post-traumatic stress disorder)    Secondary hyperparathyroidism (HCC)    Tardive dyskinesia    Thyroid disease    Past Surgical History:  Procedure Laterality Date   ABDOMINAL HYSTERECTOMY  1987   CHOLECYSTECTOMY  1979   DILATION AND CURETTAGE OF UTERUS  1973   MASTECTOMY Bilateral 09/19/2014   TONSILLECTOMY  1970   URETERAL REIMPLANTION Bilateral 1974   Patient Active Problem List   Diagnosis Date Noted   Raynaud's phenomenon without gangrene 03/26/2023   Family history of breast cancer 06/05/2021   Osteoarthritis, knee 09/24/2019   CKD (chronic kidney disease) stage 4, GFR 15-29 ml/min (HCC) 09/24/2019   Severe recurrent major depression without psychotic features (HCC) 09/09/2019   Bipolar I disorder, most recent episode  depressed (HCC)    Transaminitis    Encephalopathy 04/26/2019   Leukocytosis 04/21/2019   Thrombocytopenia (HCC) 09/04/2018   Vitamin D deficiency 09/04/2018   Psoriatic arthritis (HCC) 09/04/2018   Tubular adenoma of colon 05/28/2017   History of colonic polyps 05/28/2017   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 09/12/2016   Malignant neoplasm of overlapping sites of left breast in female, estrogen receptor positive (HCC) 03/14/2016   Tardive dyskinesia 10/18/2015   History of breast cancer left 2016 12/27/2014   Osteopenia determined by x-ray 10/31/2014   Rosacea 05/21/2007   Bipolar affective disorder, mixed (HCC) 08/16/2004   Acquired hypothyroidism 04/03/1996    PCP: Jarold Motto, PA  REFERRING PROVIDER: Jarold Motto, PA  REFERRING DIAG:  [phone] - recurrent pain of right knee  THERAPY DIAG:  Other abnormalities of gait and mobility  Muscle weakness (generalized)  Chronic pain of right knee  Rationale for Evaluation and Treatment Rehabilitation  ONSET DATE: 3 weeks ago  SUBJECTIVE:   SUBJECTIVE STATEMENT: The patient reports no significant pain. She has some locking at times.   PERTINENT HISTORY: Bi-polar, CKD, depression, breast cancer, OA, Parkinson's, Psoriatic Arthritis, PTSD, tardive diskenesia,   PAIN:  Are you having pain? Yes: NPRS scale: 2-3/10 pain  Pain location: right lateral knee Pain description: aching Aggravating factors: standing,  walking , twisting Relieving factors: rest  PRECAUTIONS: None  WEIGHT BEARING RESTRICTIONS No  FALLS:  Has patient fallen in last 6 months? No  LIVING ENVIRONMENT: Lives with husband  OCCUPATION: retired   PLOF: Independent has not had to use a walker  PATIENT GOALS   To have less pain in her knee and to get back into the gym   OBJECTIVE:   DIAGNOSTIC FINDINGS:  Nothing recent   PATIENT SURVEYS:   COGNITION:  Overall cognitive status: Within functional  limits for tasks assessed     SENSATION: WFL   POSTURE: rounded shoulders and forward head  PALPATION: Trigger points in lower IT band   LOWER EXTREMITY ROM:  Active PROM Right eval Left eval  Hip flexion    Hip extension    Hip abduction    Hip adduction    Hip internal rotation    Hip external rotation    Knee flexion    Knee extension -7   Ankle dorsiflexion    Ankle plantarflexion    Ankle inversion    Ankle eversion     (Blank rows = not tested)  LOWER EXTREMITY MMT:  MMT Right eval Left eval  Hip flexion 17.7  17.8  Hip extension    Hip abduction 11.1 20.4  Hip adduction    Hip internal rotation    Hip external rotation    Knee flexion    Knee extension 18.8 17.3  Ankle dorsiflexion    Ankle plantarflexion    Ankle inversion    Ankle eversion     (Blank rows = not tested)   GAIT: Decreased single leg stance time on the right    TODAY'S TREATMENT: 1/17 Nu-step 5 min  LTR x20   Gluteal stretch 3x20 sec hold   Bridge 3x10   SLR 3x10   Cable:  Row 3x10 15 lbs  Extensions 3x12 15 lbs   Step ups:  4 inch 3x10   Leg press 50 lbs 3x10  Hip abdcution 3x10 55 lbs       1/09  Manual: Knee extension ROM; trigger point release to lateral / medial knee and IT band   LF hip abduction machine 3x10 55 lbs   Row machine 3x12 15 lbs   Triceps pushdown 3x12 20 lbs   Step down 2x10 each leg  Step up 2 inch 2x10    1/2  Manual: Knee extension ROM; trigger point release to lateral / medial knee   Quad set 2x15  Hip abduction 3x15 red  Bridge 2x10   LF row machine 2x15 20 lbs  Triceps press down 2x15 20 lbs   12/19 Manual: Knee extension ROM; trigger point release to lateral knee   LF hip abduction machine 3x10   Cybex leg press 3 x 15 40 pounds  Row machine 3x12 15 lbs   Narrow base air-ex 2x30 sec eyes closed   Tandem stance on air-ex 2x30 sec hold     12/5 Manual: Knee extension ROM; trigger point release to  lateral knee   LF hip abduction machine 3x10   Cybex leg press 3 x 15 40 pounds  Quad set x 20 Straight leg raise 3 x 10 Short arc quad 3 x 10   11/22/ SAQ 3x12  Supine march 3x12  Hip abduction 3x15 red     Manual: Knee extension ROM; trigger point release to lateral knee   Eval: Manual: Knee extension ROM; trigger point release to lateral knee    PATIENT  EDUCATION:  Education details: reviewed HEP; symptom management, activity progression  Person educated: Patient Education method: Explanation, Demonstration, Tactile cues, Verbal cues, and Handouts Education comprehension: verbalized understanding, returned demonstration, verbal cues required, tactile cues required, and needs further education   HOME EXERCISE PROGRAM: Access Code: [email or web address] URL: [email or web address] Date: 01/31/2023 Prepared by: Lorayne Bender  Exercises - Supine Heel Slide with Strap  - 1 x daily - 7 x weekly - 3 sets - 5 reps - 5 sec  hold - Supine Quad Set  - 1 x daily - 7 x weekly - 3 sets - 10 reps - Supine Knee Extension Stretch on Towel Roll  - 1 x daily - 7 x weekly - 3 sets - 5 reps - 5 sec  hold  ASSESSMENT:  CLINICAL IMPRESSION: The patient continues to make goo dprogress. We were able to continue stair training today. We also reviewed cable exercises for the gym . Sh ehad no significant pain. Therapy will continue to progress as tolerated.   Eval: Patient is a 73 y.o. female who was seen today for physical therapy evaluation and treatment for right knee pain. She has had improved knee pain and mobility in the past with PT. She returns today with an acute exacerbation of her knee pain.  She continues on limited knee extension but no significant decline from her previous episode.  She has decreased balance with narrow base with eyes closed.  She is working on her pool program on her own.  She would benefit from skilled therapy to improve balance and continue to work on knee  extension. OBJECTIVE IMPAIRMENTS Abnormal gait, decreased activity tolerance, decreased endurance, decreased mobility, difficulty walking, decreased ROM, decreased strength, increased fascial restrictions, and pain.   ACTIVITY LIMITATIONS carrying, bending, standing, squatting, stairs, transfers, and locomotion level  PARTICIPATION LIMITATIONS: cleaning, laundry, shopping, community activity, and yard work  PERSONAL FACTORS 3+ comorbidities: Psoriatic arthritis, Bi-polar , Parkinsons  are also affecting patient's functional outcome.   REHAB POTENTIAL: Good  CLINICAL DECISION MAKING: Evolving/moderate complexity  EVALUATION COMPLEXITY: Moderate   GOALS: Goals reviewed with patient? Yes  SHORT TERM GOALS: Target date: 05/02/2023     Patient will increase gross right LE strength by 5 lbs  Baseline: Goal status: INITIAL  2.  Patient will be increase knee extension passive by 3 degrees  Baseline:  Goal status: Progressing 1/10  3.  Patient will be independent with basic HEP  Baseline:  Goal status: INITIAL  LONG TERM GOALS: Target date: 03/20/2023    Patient will stand for 20 min without pain in order to perform her ADL's  Baseline:  Goal status: Achieved 1/20  2.  Patient will be independent with full gym program  Baseline:  Goal status: Progressing 1/9  3.  Patient will ambulate without increased pain community distances  Baseline:  Goal status: INITIAL    PLAN: PT FREQUENCY: 2x/week  PT DURATION: 8 weeks  PLANNED INTERVENTIONS: Therapeutic exercises, Therapeutic activity, Neuromuscular re-education, Balance training, Gait training, Patient/Family education, Joint mobilization, Stair training, DME instructions, Aquatic Therapy, Dry Needling, Electrical stimulation, Cryotherapy, Moist heat, Ultrasound, and Manual therapy  PLAN FOR NEXT SESSION: continue with manual therapy to the knee; progress to standing exercises; progress back to gym activity as able.     Dessie Coma, PT 03/28/2023, 10:23 AM

## 2023-04-09 ENCOUNTER — Encounter (HOSPITAL_BASED_OUTPATIENT_CLINIC_OR_DEPARTMENT_OTHER): Payer: Self-pay | Admitting: Physical Therapy

## 2023-04-09 ENCOUNTER — Ambulatory Visit (HOSPITAL_BASED_OUTPATIENT_CLINIC_OR_DEPARTMENT_OTHER): Payer: Medicare Other | Admitting: Physical Therapy

## 2023-04-09 DIAGNOSIS — G8929 Other chronic pain: Secondary | ICD-10-CM

## 2023-04-09 DIAGNOSIS — R2689 Other abnormalities of gait and mobility: Secondary | ICD-10-CM

## 2023-04-09 DIAGNOSIS — M6281 Muscle weakness (generalized): Secondary | ICD-10-CM

## 2023-04-09 DIAGNOSIS — R2681 Unsteadiness on feet: Secondary | ICD-10-CM

## 2023-04-09 NOTE — Therapy (Signed)
OUTPATIENT PHYSICAL THERAPY LOWER EXTREMITY EVALUATION   Patient Name: Tiffany [name] MRN: [id number] DOB:1950-08-17, 73 y.o., female Today's Date: 04/09/2023   PT End of Session - 04/09/23 1339     Visit Number 8    Number of Visits 16    Date for PT Re-Evaluation 03/20/23    PT Start Time 1100    PT Stop Time 1143    PT Time Calculation (min) 43 min    Activity Tolerance Patient tolerated treatment well    Behavior During Therapy [facility] for tasks assessed/performed                 Past Medical History:  Diagnosis Date   Bipolar 1 disorder (HCC)    Chronic diarrhea    loose stools twice a day on average for years   CKD stage G4/A1, 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 1.4 cm  Grade I invasive lobular, ER and PR +/Her--, Ki 67 <5% Tried anastrozole for one month, but developed suicidal idea   Osteoarthritis, knee 09/24/2019   Xray 09/2019   Parkinsonism (HCC)    Drug induced   Polyposis of colon    Psoriatic arthritis (HCC)    PTSD (post-traumatic stress disorder)    Secondary hyperparathyroidism (HCC)    Tardive dyskinesia    Thyroid disease    Past Surgical History:  Procedure Laterality Date   ABDOMINAL HYSTERECTOMY  1987   CHOLECYSTECTOMY  1979   DILATION AND CURETTAGE OF UTERUS  1973   MASTECTOMY Bilateral 09/19/2014   TONSILLECTOMY  1970   URETERAL REIMPLANTION Bilateral 1974   Patient Active Problem List   Diagnosis Date Noted   Raynaud's phenomenon without gangrene 03/26/2023   Family history of breast cancer 06/05/2021   Osteoarthritis, knee 09/24/2019   CKD (chronic kidney disease) stage 4, GFR 15-29 ml/min (HCC) 09/24/2019   Severe recurrent major depression without psychotic features (HCC) 09/09/2019   Bipolar I disorder, most recent episode  depressed (HCC)    Transaminitis    Encephalopathy 04/26/2019   Leukocytosis 04/21/2019   Thrombocytopenia (HCC) 09/04/2018   Vitamin D deficiency 09/04/2018   Psoriatic arthritis (HCC) 09/04/2018   Tubular adenoma of colon 05/28/2017   History of colonic polyps 05/28/2017   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 09/12/2016   Malignant neoplasm of overlapping sites of left breast in female, estrogen receptor positive (HCC) 03/14/2016   Tardive dyskinesia 10/18/2015   History of breast cancer left 2016 12/27/2014   Osteopenia determined by x-ray 10/31/2014   Rosacea 05/21/2007   Bipolar affective disorder, mixed (HCC) 08/16/2004   Acquired hypothyroidism 04/03/1996    PCP: Jarold Motto, PA  REFERRING PROVIDER: Jarold Motto, PA  REFERRING DIAG:  [phone] - recurrent pain of right knee  THERAPY DIAG:  Other abnormalities of gait and mobility  Muscle weakness (generalized)  Chronic pain of right knee  Unsteadiness on feet  Rationale for Evaluation and Treatment Rehabilitation  ONSET DATE: 3 weeks ago  SUBJECTIVE:   SUBJECTIVE STATEMENT: The patient reports no significant pain. She has some locking at times.   PERTINENT HISTORY: Bi-polar, CKD, depression, breast cancer, OA, Parkinson's, Psoriatic Arthritis, PTSD, tardive diskenesia,   PAIN:  Are you having pain? Yes: NPRS scale: 2-3/10 pain  Pain location: right lateral knee Pain  description: aching Aggravating factors: standing, walking , twisting Relieving factors: rest  PRECAUTIONS: None  WEIGHT BEARING RESTRICTIONS No  FALLS:  Has patient fallen in last 6 months? No  LIVING ENVIRONMENT: Lives with husband  OCCUPATION: retired   PLOF: Independent has not had to use a walker  PATIENT GOALS   To have less pain in her knee and to get back into the gym   OBJECTIVE:   DIAGNOSTIC FINDINGS:  Nothing recent   PATIENT SURVEYS:   COGNITION:  Overall cognitive status:  Within functional limits for tasks assessed     SENSATION: WFL   POSTURE: rounded shoulders and forward head  PALPATION: Trigger points in lower IT band   LOWER EXTREMITY ROM:  Active PROM Right eval Left eval  Hip flexion    Hip extension    Hip abduction    Hip adduction    Hip internal rotation    Hip external rotation    Knee flexion    Knee extension -7   Ankle dorsiflexion    Ankle plantarflexion    Ankle inversion    Ankle eversion     (Blank rows = not tested)  LOWER EXTREMITY MMT:  MMT Right eval Left eval  Hip flexion 17.7  17.8  Hip extension    Hip abduction 11.1 20.4  Hip adduction    Hip internal rotation    Hip external rotation    Knee flexion    Knee extension 18.8 17.3  Ankle dorsiflexion    Ankle plantarflexion    Ankle inversion    Ankle eversion     (Blank rows = not tested)   GAIT: Decreased single leg stance time on the right    TODAY'S TREATMENT: 1/29  Manual: trigger point release to IT band; PA and AP mobilization to improve   Cable:  Row 3x10 15 lbs  Extensions 3x12 15 lbs   Step ups:  4 inch 3x10   Leg press 50 lbs 3x10  Hip abdcution 3x10 55 lbs     1/17 Nu-step 5 min  LTR x20   Gluteal stretch 3x20 sec hold   Bridge 3x10   SLR 3x10   Cable:  Row 3x10 15 lbs  Extensions 3x12 15 lbs   Step ups:  4 inch 3x10   Leg press 50 lbs 3x10  Hip abdcution 3x10 55 lbs       1/09  Manual: Knee extension ROM; trigger point release to lateral / medial knee and IT band   LF hip abduction machine 3x10 55 lbs   Row machine 3x12 15 lbs   Triceps pushdown 3x12 20 lbs   Step down 2x10 each leg  Step up 2 inch 2x10    1/2  Manual: Knee extension ROM; trigger point release to lateral / medial knee   Quad set 2x15  Hip abduction 3x15 red  Bridge 2x10   LF row machine 2x15 20 lbs  Triceps press down 2x15 20 lbs   12/19 Manual: Knee extension ROM; trigger point release to lateral knee   LF  hip abduction machine 3x10   Cybex leg press 3 x 15 40 pounds  Row machine 3x12 15 lbs   Narrow base air-ex 2x30 sec eyes closed   Tandem stance on air-ex 2x30 sec hold     12/5 Manual: Knee extension ROM; trigger point release to lateral knee   LF hip abduction machine 3x10   Cybex leg press 3 x 15 40 pounds  Quad set x  20 Straight leg raise 3 x 10 Short arc quad 3 x 10   11/22/ SAQ 3x12  Supine march 3x12  Hip abduction 3x15 red     Manual: Knee extension ROM; trigger point release to lateral knee   Eval: Manual: Knee extension ROM; trigger point release to lateral knee    PATIENT EDUCATION:  Education details: reviewed HEP; symptom management, activity progression  Person educated: Patient Education method: Explanation, Demonstration, Tactile cues, Verbal cues, and Handouts Education comprehension: verbalized understanding, returned demonstration, verbal cues required, tactile cues required, and needs further education   HOME EXERCISE PROGRAM: Access Code: [email or web address] URL: [email or web address] Date: 01/31/2023 Prepared by: Lorayne Bender  Exercises - Supine Heel Slide with Strap  - 1 x daily - 7 x weekly - 3 sets - 5 reps - 5 sec  hold - Supine Quad Set  - 1 x daily - 7 x weekly - 3 sets - 10 reps - Supine Knee Extension Stretch on Towel Roll  - 1 x daily - 7 x weekly - 3 sets - 5 reps - 5 sec  hold  ASSESSMENT:  CLINICAL IMPRESSION: The patient continues to make goo dprogress. We were able to continue stair training today. We also reviewed cable exercises for the gym . Sh ehad no significant pain. Therapy will continue to progress as tolerated.   Eval: Patient is a 73 y.o. female who was seen today for physical therapy evaluation and treatment for right knee pain. She has had improved knee pain and mobility in the past with PT. She returns today with an acute exacerbation of her knee pain.  She continues on limited knee extension but no  significant decline from her previous episode.  She has decreased balance with narrow base with eyes closed.  She is working on her pool program on her own.  She would benefit from skilled therapy to improve balance and continue to work on knee extension. OBJECTIVE IMPAIRMENTS Abnormal gait, decreased activity tolerance, decreased endurance, decreased mobility, difficulty walking, decreased ROM, decreased strength, increased fascial restrictions, and pain.   ACTIVITY LIMITATIONS carrying, bending, standing, squatting, stairs, transfers, and locomotion level  PARTICIPATION LIMITATIONS: cleaning, laundry, shopping, community activity, and yard work  PERSONAL FACTORS 3+ comorbidities: Psoriatic arthritis, Bi-polar , Parkinsons  are also affecting patient's functional outcome.   REHAB POTENTIAL: Good  CLINICAL DECISION MAKING: Evolving/moderate complexity  EVALUATION COMPLEXITY: Moderate   GOALS: Goals reviewed with patient? Yes  SHORT TERM GOALS: Target date: 05/02/2023     Patient will increase gross right LE strength by 5 lbs  Baseline: Goal status: INITIAL  2.  Patient will be increase knee extension passive by 3 degrees  Baseline:  Goal status: Progressing 1/10  3.  Patient will be independent with basic HEP  Baseline:  Goal status: INITIAL  LONG TERM GOALS: Target date: 03/20/2023    Patient will stand for 20 min without pain in order to perform her ADL's  Baseline:  Goal status: Achieved 1/20  2.  Patient will be independent with full gym program  Baseline:  Goal status: Progressing 1/9  3.  Patient will ambulate without increased pain community distances  Baseline:  Goal status: INITIAL    PLAN: PT FREQUENCY: 2x/week  PT DURATION: 8 weeks  PLANNED INTERVENTIONS: Therapeutic exercises, Therapeutic activity, Neuromuscular re-education, Balance training, Gait training, Patient/Family education, Joint mobilization, Stair training, DME instructions, Aquatic  Therapy, Dry Needling, Electrical stimulation, Cryotherapy, Moist heat, Ultrasound, and Manual therapy  PLAN FOR NEXT SESSION: continue with  manual therapy to the knee; progress to standing exercises; progress back to gym activity as able.    Dessie Coma, PT 04/09/2023, 1:41 PM

## 2023-04-10 ENCOUNTER — Encounter (HOSPITAL_BASED_OUTPATIENT_CLINIC_OR_DEPARTMENT_OTHER): Payer: Self-pay | Admitting: Physical Therapy

## 2023-04-17 ENCOUNTER — Encounter (HOSPITAL_BASED_OUTPATIENT_CLINIC_OR_DEPARTMENT_OTHER): Payer: Self-pay

## 2023-04-17 ENCOUNTER — Ambulatory Visit (HOSPITAL_BASED_OUTPATIENT_CLINIC_OR_DEPARTMENT_OTHER): Payer: Medicare Other | Attending: Physician Assistant | Admitting: Physical Therapy

## 2023-04-23 ENCOUNTER — Ambulatory Visit (HOSPITAL_BASED_OUTPATIENT_CLINIC_OR_DEPARTMENT_OTHER): Payer: Medicare Other | Attending: Physician Assistant | Admitting: Physical Therapy

## 2023-04-23 ENCOUNTER — Encounter (HOSPITAL_BASED_OUTPATIENT_CLINIC_OR_DEPARTMENT_OTHER): Payer: Self-pay | Admitting: Physical Therapy

## 2023-04-23 DIAGNOSIS — R2681 Unsteadiness on feet: Secondary | ICD-10-CM | POA: Insufficient documentation

## 2023-04-23 DIAGNOSIS — R2689 Other abnormalities of gait and mobility: Secondary | ICD-10-CM | POA: Insufficient documentation

## 2023-04-23 DIAGNOSIS — G8929 Other chronic pain: Secondary | ICD-10-CM | POA: Insufficient documentation

## 2023-04-23 DIAGNOSIS — M25652 Stiffness of left hip, not elsewhere classified: Secondary | ICD-10-CM | POA: Diagnosis present

## 2023-04-23 DIAGNOSIS — M6281 Muscle weakness (generalized): Secondary | ICD-10-CM | POA: Diagnosis present

## 2023-04-23 DIAGNOSIS — M25561 Pain in right knee: Secondary | ICD-10-CM | POA: Diagnosis present

## 2023-04-23 DIAGNOSIS — M25552 Pain in left hip: Secondary | ICD-10-CM | POA: Diagnosis present

## 2023-04-23 NOTE — Therapy (Signed)
OUTPATIENT PHYSICAL THERAPY LOWER EXTREMITY Progress Note    Patient Name: Tiffany [name] MRN: [id number] DOB:Jul 17, 1950, 73 y.o., female Today's Date: 04/23/2023   PT End of Session - 04/23/23 1306     Visit Number 9    Number of Visits 19    Date for PT Re-Evaluation 07/02/23    PT Start Time 1300    PT Stop Time 1342    PT Time Calculation (min) 42 min                 Past Medical History:  Diagnosis Date   Bipolar 1 disorder (HCC)    Chronic diarrhea    loose stools twice a day on average for years   CKD stage G4/A1, 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 1.4 cm  Grade I invasive lobular, ER and PR +/Her--, Ki 67 <5% Tried anastrozole for one month, but developed suicidal idea   Osteoarthritis, knee 09/24/2019   Xray 09/2019   Parkinsonism (HCC)    Drug induced   Polyposis of colon    Psoriatic arthritis (HCC)    PTSD (post-traumatic stress disorder)    Secondary hyperparathyroidism (HCC)    Tardive dyskinesia    Thyroid disease    Past Surgical History:  Procedure Laterality Date   ABDOMINAL HYSTERECTOMY  1987   CHOLECYSTECTOMY  1979   DILATION AND CURETTAGE OF UTERUS  1973   MASTECTOMY Bilateral 09/19/2014   TONSILLECTOMY  1970   URETERAL REIMPLANTION Bilateral 1974   Patient Active Problem List   Diagnosis Date Noted   Raynaud's phenomenon without gangrene 03/26/2023   Family history of breast cancer 06/05/2021   Osteoarthritis, knee 09/24/2019   CKD (chronic kidney disease) stage 4, GFR 15-29 ml/min (HCC) 09/24/2019   Severe recurrent major depression without psychotic features (HCC) 09/09/2019   Bipolar I disorder, most recent episode depressed (HCC)    Transaminitis    Encephalopathy 04/26/2019   Leukocytosis 04/21/2019   Thrombocytopenia  (HCC) 09/04/2018   Vitamin D deficiency 09/04/2018   Psoriatic arthritis (HCC) 09/04/2018   Tubular adenoma of colon 05/28/2017   History of colonic polyps 05/28/2017   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      PCP: Jarold Motto, PA  REFERRING PROVIDER: Jarold Motto, PA  REFERRING DIAG:  [id number] - recurrent pain of right knee  THERAPY DIAG:  Other abnormalities of gait and mobility  Muscle weakness (generalized)  Chronic pain of right knee  Unsteadiness on feet  Rationale for Evaluation and Treatment Rehabilitation  ONSET DATE: 3 weeks ago  SUBJECTIVE:   SUBJECTIVE STATEMENT: The patient reports mild lateral pain today. It just started a few hours ago. Overall her knee has been doing well.   PERTINENT HISTORY: Bi-polar, CKD, depression, breast cancer, OA, Parkinson's, Psoriatic Arthritis, PTSD, tardive diskenesia,   PAIN:  Are  you having pain? Yes: NPRS scale: 2-3/10 pain  Pain location:IT band  Pain description: aching Aggravating factors: standing, walking , twisting Relieving factors: rest  PRECAUTIONS: None  WEIGHT BEARING RESTRICTIONS No  FALLS:  Has patient fallen in last 6 months? No  LIVING ENVIRONMENT: Lives with husband  OCCUPATION: retired   PLOF: Independent has not had to use a walker  PATIENT GOALS   To have less pain in her knee and to get back into the gym   OBJECTIVE:   DIAGNOSTIC FINDINGS:  Nothing recent    PATIENT SURVEYS:   COGNITION:  Overall cognitive status: Within functional limits for tasks assessed     SENSATION: WFL   POSTURE: rounded shoulders and forward head  PALPATION: Trigger points in lower IT band   LOWER EXTREMITY ROM:  Active PROM Right eval Left eval Right  2/12  Hip flexion     Hip extension     Hip abduction     Hip adduction     Hip internal rotation     Hip external rotation     Knee flexion     Knee extension -7  -5  Ankle dorsiflexion     Ankle plantarflexion     Ankle inversion     Ankle eversion      (Blank rows = not tested)  LOWER EXTREMITY MMT:  MMT Right eval Left eval Right  2/12 Left 2/12   Hip flexion 17.7  17.8 28.5 38.8  Hip extension      Hip abduction 11.1 20.4 32.3 40.4  Hip adduction      Hip internal rotation      Hip external rotation      Knee flexion      Knee extension 18.8 17.3 38.5 51.8  Ankle dorsiflexion      Ankle plantarflexion      Ankle inversion      Ankle eversion       (Blank rows = not tested)   GAIT: Decreased single leg stance time on the right    TODAY'S TREATMENT:    2/12  Manual: trigger point release to IT band; PA and AP mobilization to improve extension  There-ex:  Quad set 2x15  SLR 3x10  Reviewed strength measurements    NuStep 5 minutes reviewed set up level for  Triceps press down [employer] 40 pounds 3 x 12 Cable row with abdominal breathing 15 lbs 3x12    1/29  Manual: trigger point release to IT band; PA and AP mobilization to improve      Leg press 50 lbs 3x10  Hip abdcution 3x10 55 lbs   NuStep 5 minutes reviewed set up level for  Triceps press down [employer] 30 pounds 3 x 12 Cybex row machine 30 pounds 3 x 12    1/17 Nu-step 5 min  LTR x20   Gluteal stretch 3x20 sec hold   Bridge 3x10   SLR 3x10   Cable:  Row 3x10 15 lbs  Extensions 3x12 15 lbs   Step ups:  4 inch 3x10   Leg press 50 lbs 3x10  Hip abdcution 3x10 55 lbs        1/09  Manual: Knee extension ROM; trigger point release to lateral / medial knee and IT band   LF hip abduction machine 3x10 55 lbs   Row machine 3x12 15 lbs   Triceps pushdown 3x12 20 lbs   Step down 2x10 each leg  Step up 2 inch 2x10  1/2  Manual: Knee extension ROM; trigger point release to lateral / medial knee   Quad set 2x15  Hip abduction 3x15 red  Bridge 2x10   LF row machine 2x15 20 lbs  Triceps press down 2x15 20 lbs   12/19 Manual: Knee extension ROM; trigger point release to lateral knee   LF hip abduction machine 3x10   Cybex leg press 3 x 15 40 pounds  Row machine 3x12 15 lbs   Narrow base air-ex 2x30 sec eyes closed   Tandem stance on air-ex 2x30 sec hold     12/5 Manual: Knee extension ROM; trigger point release to lateral knee   LF hip abduction machine 3x10   Cybex leg press 3 x 15 40 pounds  Quad set x 20 Straight leg raise 3 x 10 Short arc quad 3 x 10   11/22/ SAQ 3x12  Supine march 3x12  Hip abduction 3x15 red     Manual: Knee extension ROM; trigger point release to lateral knee   Eval: Manual: Knee extension ROM; trigger point release to lateral knee    PATIENT EDUCATION:  Education details: reviewed HEP; symptom management, activity progression  Person educated: Patient Education method: Explanation, Demonstration, Tactile cues, Verbal cues, and Handouts Education comprehension: verbalized understanding, returned demonstration, verbal cues required, tactile cues required, and needs further education   HOME EXERCISE PROGRAM: Access Code: [email or web address] URL: [email or web address] Date: 01/31/2023 Prepared by: Lorayne Bender  Exercises - Supine Heel Slide with Strap  - 1 x daily - 7 x weekly - 3 sets - 5 reps - 5 sec  hold - Supine Quad Set  - 1 x daily - 7 x weekly - 3 sets - 10 reps - Supine Knee Extension Stretch on Towel Roll  - 1 x daily - 7 x weekly - 3 sets - 5 reps - 5 sec   hold  ASSESSMENT:  CLINICAL IMPRESSION: Therapy performed progress note today. The patient mad an excellent improvement In strength. Her knee ROM has progressed as well. Overall she feels like she is able to do more around the house and in the community. She is havingvery littleknee pain. She would benefit from further skilled therapy 1W10  Eval: Patient is a 73 y.o. female who was seen today for physical therapy evaluation and treatment for right knee pain. She has had improved knee pain and mobility in the past with PT. She returns today with an acute exacerbation of her knee pain.  She continues on limited knee extension but no significant decline from her previous episode.  She has decreased balance with narrow base with eyes closed.  She is working on her pool program on her own.  She would benefit from skilled therapy to improve balance and continue to work on knee extension. OBJECTIVE IMPAIRMENTS Abnormal gait, decreased activity tolerance, decreased endurance, decreased mobility, difficulty walking, decreased ROM, decreased strength, increased fascial restrictions, and pain.   ACTIVITY LIMITATIONS carrying, bending, standing, squatting, stairs, transfers, and locomotion level  PARTICIPATION LIMITATIONS: cleaning, laundry, shopping, community activity, and yard work  PERSONAL FACTORS 3+ comorbidities: Psoriatic arthritis, Bi-polar , Parkinsons  are also affecting patient's functional outcome.   REHAB POTENTIAL: Good  CLINICAL DECISION MAKING: Evolving/moderate complexity  EVALUATION COMPLEXITY: Moderate   GOALS: Goals reviewed with patient? Yes  SHORT TERM GOALS: Target date: 05/02/2023     Patient will increase gross right LE strength by 5 lbs  Baseline: Goal status: INITIAL  2.  Patient will be increase knee extension passive by  3 degrees  Baseline:  Goal status: Progressing 1/10  3.  Patient will be independent with basic HEP  Baseline:  Goal status: INITIAL  LONG  TERM GOALS: Target date: 03/20/2023    Patient will stand for 20 min without pain in order to perform her ADL's  Baseline:  Goal status: Achieved 1/20  2.  Patient will be independent with full gym program  Baseline:  Goal status: Progressing 1/9  3.  Patient will ambulate without increased pain community distances  Baseline:  Goal status: INITIAL    PLAN: PT FREQUENCY: 2x/week  PT DURATION: 8 weeks  PLANNED INTERVENTIONS: Therapeutic exercises, Therapeutic activity, Neuromuscular re-education, Balance training, Gait training, Patient/Family education, Joint mobilization, Stair training, DME instructions, Aquatic Therapy, Dry Needling, Electrical stimulation, Cryotherapy, Moist heat, Ultrasound, and Manual therapy  PLAN FOR NEXT SESSION: continue with manual therapy to the knee; progress to standing exercises; progress back to gym activity as able.    Dessie Coma, PT 04/23/2023, 1:10 PM

## 2023-04-24 ENCOUNTER — Ambulatory Visit: Payer: Medicare Other

## 2023-04-24 VITALS — Wt 179.0 lb

## 2023-04-24 DIAGNOSIS — Z0000 Encounter for general adult medical examination without abnormal findings: Secondary | ICD-10-CM

## 2023-04-24 NOTE — Progress Notes (Signed)
Subjective:   Tiffany [name] is a 73 y.o. female who presents for Medicare Annual (Subsequent) preventive examination.  Visit Complete: Virtual I connected with  Tiffany [name] on 04/24/23 by a audio enabled telemedicine application and verified that I am speaking with the correct person using two identifiers.  Patient Location: Home  Provider Location: Office/Clinic  I discussed the limitations of evaluation and management by telemedicine. The patient expressed understanding and agreed to proceed.  Vital Signs: Because this visit was a virtual/telehealth visit, some criteria may be missing or patient reported. Any vitals not documented were not able to be obtained and vitals that have been documented are patient reported.  Patient Medicare AWV questionnaire was completed by the patient on 04/21/23; I have confirmed that all information answered by patient is correct and no changes since this date.  Cardiac Risk Factors include: advanced age (>38men, >82 women)     Objective:    Today's Vitals   04/24/23 1333  Weight: 179 lb (81.2 kg)   Body mass index is 30.73 kg/m.     04/24/2023    1:41 PM 01/22/2023    4:10 PM 05/10/2022    3:00 PM 04/15/2022    1:41 PM 04/03/2022    2:15 PM 11/26/2021    6:15 PM 10/29/2021   10:40 AM  Advanced Directives  Does Patient Have a Medical Advance Directive? Yes Yes Yes Yes No Yes Yes  Type of Estate agent of [place];Living will Healthcare Power of [place];Living will Healthcare Power of [place];Living will Healthcare Power of [place];Living will  Living will Living will  Does patient want to make changes to medical advance directive? No - Patient declined   No - Patient declined     Copy of Healthcare Power of Attorney in Chart? Yes - validated most recent copy scanned in chart (See row information) No - copy requested  Yes - validated most recent copy scanned in chart (See row information)     Would patient like information  on creating a medical advance directive? No - Patient declined   No - Patient declined No - Patient declined      Current Medications (verified) Outpatient Encounter Medications as of 04/24/2023  Medication Sig   amLODipine (NORVASC) 5 MG tablet TAKE 1 TABLET(5 MG) BY MOUTH DAILY   atorvastatin (LIPITOR) 10 MG tablet TAKE 1 TABLET(10 MG) BY MOUTH DAILY   augmented betamethasone dipropionate (DIPROLENE-AF) 0.05 % cream APPLY TO THE AFFECTED AREAS OF LEG TWICE DAILY AS NEEDED FOR RASH. NOT TO FACE, GROIN, UNDERARMS   cariprazine (VRAYLAR) 1.5 MG capsule TAKE 1 CAPSULE BY MOUTH EVERY MORNING Oral for 30 days   Cholecalciferol 50 MCG (2000 UT) TABS 1 tablet Orally Once a day for 30 day(s)   docusate sodium (COLACE) 100 MG capsule Take 100 mg by mouth 2 (two) times daily.   Golimumab (SIMPONI ARIA IV) Infusion every 8 weeks   ipratropium (ATROVENT) 0.03 % nasal spray Place 2 sprays into both nostrils every 12 (twelve) hours.   levothyroxine (SYNTHROID) 50 MCG tablet Take 1 tablet (50 mcg total) by mouth as directed. Take 2 tabs on Sundays and Wednesday,  1 tab rest of the week   LORazepam (ATIVAN) 0.5 MG tablet Take 0.5 mg by mouth 3 (three) times daily as needed.   metoprolol succinate (TOPROL XL) 25 MG 24 hr tablet Take 0.5 tablets (12.5 mg total) by mouth daily.   metroNIDAZOLE (METROGEL) 0.75 % gel Apply to face 1-2 times  daily   mirtazapine (REMERON) 45 MG tablet Take 45 mg by mouth at bedtime.   Omega-3 Fatty Acids (FISH OIL) 1200 MG CAPS Take 1 capsule by mouth in the morning and at bedtime.   Probiotic Product (ALIGN) 4 MG CAPS Take 1 capsule by mouth daily in the afternoon.   [DISCONTINUED] hydrOXYzine (ATARAX) 10 MG tablet Take by mouth.   [DISCONTINUED] lurasidone (LATUDA) 80 MG TABS tablet    [DISCONTINUED] Lurasidone HCl 60 MG TABS Take by mouth.   No facility-administered encounter medications on file as of 04/24/2023.    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 1.4 cm  Grade I invasive lobular, ER and PR +/Her--, Ki 67 <5% Tried anastrozole for one month, but developed suicidal idea   Osteoarthritis, knee 09/24/2019   Xray 09/2019   Parkinsonism (HCC)    Drug induced   Polyposis of colon    Psoriatic arthritis (HCC)    PTSD (post-traumatic stress disorder)    Secondary hyperparathyroidism (HCC)    Tardive dyskinesia    Thyroid disease    Past Surgical History:  Procedure Laterality Date   ABDOMINAL HYSTERECTOMY  1987   CHOLECYSTECTOMY  1979   DILATION AND CURETTAGE OF UTERUS  1973   MASTECTOMY Bilateral 09/19/2014   TONSILLECTOMY  1970   URETERAL REIMPLANTION Bilateral 1974   Family History  Problem Relation Age of Onset   Lymphoma Mother 62   Lymphoma Sister 28   Breast cancer Sister 36   Colon polyps Sister    Lung cancer Sister        former smoker   Stroke Maternal Grandfather    Diabetes Paternal Grandfather    Colon cancer Neg Hx    Esophageal cancer Neg Hx    Stomach cancer Neg Hx    Rectal cancer Neg Hx    Social History   Socioeconomic History   Marital status: Married    Spouse name: Koren Bound   Number of children: 3   Years of education: Not on file   Highest education level: Associate degree: occupational, Scientist, product/process development, or vocational program  Occupational History   Occupation: retired  Tobacco Use   Smoking status: Never   Smokeless tobacco: Never  Vaping Use    Vaping status: Never Used  Substance and Sexual Activity   Alcohol use: Never   Drug use: Never   Sexual activity: Not Currently  Other Topics Concern   Not on file  Social History Narrative   Moved to area from [place] 08/2018   Lives one story home   Right handed.   Social Drivers of Corporate investment banker Strain: Low Risk  (04/24/2023)   Overall Financial Resource Strain (CARDIA)    Difficulty of Paying Living Expenses: Not hard at all  Food Insecurity: No Food Insecurity (04/24/2023)   Hunger Vital Sign    Worried About Running Out of Food in the Last Year: Never true    Ran Out of  Food in the Last Year: Never true  Transportation Needs: No Transportation Needs (04/24/2023)   PRAPARE - Administrator, Civil Service (Medical): No    Lack of Transportation (Non-Medical): No  Physical Activity: Sufficiently Active (04/24/2023)   Exercise Vital Sign    Days of Exercise per Week: 3 days    Minutes of Exercise per Session: 50 min  Recent Concern: Physical Activity - Insufficiently Active (03/23/2023)   Exercise Vital Sign    Days of Exercise per Week: 3 days    Minutes of Exercise per Session: 40 min  Stress: No Stress Concern Present (04/24/2023)   [employer] of Occupational Health - Occupational Stress Questionnaire    Feeling of Stress : Not at all  Social Connections: Moderately Integrated (04/24/2023)   Social Connection and Isolation Panel [NHANES]    Frequency of Communication with Friends and Family: More than three times a week    Frequency of Social Gatherings with Friends and Family: More than three times a week    Attends Religious Services: More than 4 times per year    Active Member of [employer] or Organizations: No    Attends Engineer, structural: Never    Marital Status: Married    Tobacco Counseling Counseling given: Not Answered   Clinical Intake:  Pre-visit preparation completed: Yes  Pain : No/denies pain     BMI -  recorded: 30.73 Nutritional Status: BMI > 30  Obese Nutritional Risks: None  How often do you need to have someone help you when you read instructions, pamphlets, or other written materials from your doctor or pharmacy?: 1 - Never  Interpreter Needed?: No  Information entered by :: Lanier Ensign, LPN   Activities of Daily Living    04/21/2023    4:27 PM  In your present state of health, do you have any difficulty performing the following activities:  Hearing? 0  Vision? 0  Difficulty concentrating or making decisions? 0  Walking or climbing stairs? 0  Dressing or bathing? 0  Doing errands, shopping? 1  Comment has assist  Preparing Food and eating ? N  Using the Toilet? N  In the past six months, have you accidently leaked urine? N  Do you have problems with loss of bowel control? N  Managing your Medications? N  Managing your Finances? N  Housekeeping or managing your Housekeeping? N    Patient Care Team: Jarold Motto, [place] as PCP - General (Physician Assistant) Mozingo, Thereasa Solo, NP as Nurse Practitioner (Psychiatry) Tressia Danas, MD (Inactive) as Consulting Physician (Gastroenterology) Huston Foley, MD as Consulting Physician (Neurology) Rheumatology, [facility] as Consulting Physician (Rheumatology) Van Clines, MD as Consulting Physician (Neurology) Associates, [facility] as Consulting Physician (Nephrology) Van Clines, MD as Consulting Physician (Neurology) Robley Fries, PhD (Psychiatry) Associates, [facility] (Ophthalmology)  Indicate any recent Medical Services you may have received from other than [facility] providers in the past year (date may be approximate).     Assessment:   This is a routine wellness examination for [place].  Hearing/Vision screen Hearing Screening - Comments:: Pt denies any hearing issues  Vision Screening - Comments:: Pt follows up with Dr Sherryll Burger [facility] for annual eye exams    Goals  Addressed             This Visit's Progress    Patient Stated       Lose weight        Depression Screen  04/24/2023    1:40 PM 12/30/2022   10:47 AM 10/29/2022   10:53 AM 07/15/2022    3:21 PM 07/15/2022    2:33 PM 05/06/2022    9:41 AM 04/25/2022    9:48 AM  PHQ 2/9 Scores  PHQ - 2 Score 0 0 2 3 0 0 0  PHQ- 9 Score 0  13 15  0 6    Fall Risk    04/21/2023    4:27 PM 12/30/2022   10:47 AM 07/15/2022    2:33 PM 05/10/2022    3:00 PM 04/15/2022    1:42 PM  Fall Risk   Falls in the past year? 0 0 0 0 0  Number falls in past yr:  0 0 0 0  Injury with Fall?  0 0 0 0  Risk for fall due to :  No Fall Risks No Fall Risks  Impaired vision  Follow up Falls prevention discussed Falls evaluation completed  Falls evaluation completed Falls prevention discussed    MEDICARE RISK AT HOME: Medicare Risk at Home Any stairs in or around the home?: (Patient-Rptd) Yes If so, are there any without handrails?: (Patient-Rptd) No Home free of loose throw rugs in walkways, pet beds, electrical cords, etc?: (Patient-Rptd) Yes Adequate lighting in your home to reduce risk of falls?: (Patient-Rptd) Yes Life alert?: (Patient-Rptd) No Use of a cane, walker or w/c?: (Patient-Rptd) No Grab bars in the bathroom?: (Patient-Rptd) Yes Shower chair or bench in shower?: (Patient-Rptd) Yes Elevated toilet seat or a handicapped toilet?: (Patient-Rptd) Yes  TIMED UP AND GO:  Was the test performed?  No    Cognitive Function:    06/07/2019    2:00 PM  MMSE - Mini Mental State Exam  Orientation to time 4  Orientation to Place 5  Registration 3  Attention/ Calculation 5  Recall 3  Language- name 2 objects 2  Language- repeat 1  Language- follow 3 step command 3  Language- read & follow direction 1  Write a sentence 1  Copy design 1  Total score 29        04/24/2023    1:42 PM 04/15/2022    1:44 PM 04/02/2021    1:06 PM 03/16/2020    2:54 PM  6CIT Screen  What Year? 0 points 0 points 0 points 0  points  What month? 0 points 0 points 0 points 0 points  What time? 0 points 0 points 0 points   Count back from 20 0 points 0 points 0 points 0 points  Months in reverse 0 points 4 points 0 points 0 points  Repeat phrase 0 points 0 points 0 points 0 points  Total Score 0 points 4 points 0 points     Immunizations Immunization History  Administered Date(s) Administered   Fluad Quad(high Dose 65+) 12/17/2022   Influenza Inj Mdck Quad Pf 01/05/2019   Influenza, High Dose Seasonal PF 12/13/2014, 01/22/2016, 11/25/2016, 11/18/2017, 11/29/2020, 12/05/2021   Influenza, Seasonal, Injecte, Preservative Fre 01/22/2016   Influenza-Unspecified 12/10/2019   PFIZER(Purple Top)SARS-COV-2 Vaccination 07/09/2019, 08/02/2019, 11/09/2019   PPD Test 02/27/2016   Pfizer Covid-19 Vaccine Bivalent Booster 90yrs & up 01/23/2021   Pfizer(Comirnaty)Fall Seasonal Vaccine 12 years and older 12/17/2021, 12/17/2022   Pneumococcal Conjugate-13 12/13/2014   Pneumococcal Polysaccharide-23 02/07/2016   Respiratory Syncytial Virus Vaccine,Recomb Aduvanted(Arexvy) 12/26/2021   Tdap 05/26/2015    TDAP status: Up to date  Flu Vaccine status: Up to date  Pneumococcal vaccine status: Up to date  Covid-19 vaccine  status: Information provided on how to obtain vaccines.   Qualifies for Shingles Vaccine? No    Screening Tests Health Maintenance  Topic Date Due   COVID-19 Vaccine (7 - 2024-25 season) 02/11/2023   Medicare Annual Wellness (AWV)  04/23/2024   DEXA SCAN  07/25/2024   DTaP/Tdap/Td (2 - Td or Tdap) 05/25/2025   Colonoscopy  06/12/2031   Pneumonia Vaccine 110+ Years old  Completed   INFLUENZA VACCINE  Completed   HPV VACCINES  Aged Out   MAMMOGRAM  Discontinued   Hepatitis C Screening  Discontinued   Zoster Vaccines- Shingrix  Discontinued    Health Maintenance  Health Maintenance Due  Topic Date Due   COVID-19 Vaccine (7 - 2024-25 season) 02/11/2023    Colorectal cancer screening: Type of  screening: Colonoscopy. Completed 06/11/21. Repeat every 10 years    Bone Density status: Completed 07/26/22. Results reflect: Bone density results: OSTEOPENIA. Repeat every 2 years.   Additional Screening:  Hepatitis C Screening:  Completed 07/02/21  Vision Screening: Recommended annual ophthalmology exams for early detection of glaucoma and other disorders of the eye. Is the patient up to date with their annual eye exam?  Yes  Who is the provider or what is the name of the office in which the patient attends annual eye exams? [facility]  If pt is not established with a provider, would they like to be referred to a provider to establish care? No .   Dental Screening: Recommended annual dental exams for proper oral hygiene  Community Resource Referral / Chronic Care Management: CRR required this visit?  No   CCM required this visit?  No     Plan:     I have personally reviewed and noted the following in the patient's chart:   Medical and social history Use of alcohol, tobacco or illicit drugs  Current medications and supplements including opioid prescriptions. Patient is not currently taking opioid prescriptions. Functional ability and status Nutritional status Physical activity Advanced directives List of other physicians Hospitalizations, surgeries, and ER visits in previous 12 months Vitals Screenings to include cognitive, depression, and falls Referrals and appointments  In addition, I have reviewed and discussed with patient certain preventive protocols, quality metrics, and best practice recommendations. A written personalized care plan for preventive services as well as general preventive health recommendations were provided to patient.     Marzella Schlein, LPN   [id number]   After Visit Summary: (MyChart) Due to this being a telephonic visit, the after visit summary with patients personalized plan was offered to patient via MyChart   Nurse Notes: none

## 2023-04-24 NOTE — Patient Instructions (Signed)
Tiffany [name] , Thank you for taking time to come for your Medicare Wellness Visit. I appreciate your ongoing commitment to your health goals. Please review the following plan we discussed and let me know if I can assist you in the future.   Referrals/Orders/Follow-Ups/Clinician Recommendations: Aim for 30 minutes of exercise or brisk walking, 6-8 glasses of water, and 5 servings of fruits and vegetables each day. Continue to work on losing weight   This is a list of the screening recommended for you and due dates:  Health Maintenance  Topic Date Due   COVID-19 Vaccine (7 - 2024-25 season) 02/11/2023   Medicare Annual Wellness Visit  04/16/2023   DEXA scan (bone density measurement)  07/25/2024   DTaP/Tdap/Td vaccine (2 - Td or Tdap) 05/25/2025   Colon Cancer Screening  06/12/2031   Pneumonia Vaccine  Completed   Flu Shot  Completed   HPV Vaccine  Aged Out   Mammogram  Discontinued   Hepatitis C Screening  Discontinued   Zoster (Shingles) Vaccine  Discontinued    Advanced directives: (In Chart) A copy of your advanced directives are scanned into your chart should your provider ever need it.  Next Medicare Annual Wellness Visit scheduled for next year: Yes

## 2023-04-29 ENCOUNTER — Ambulatory Visit (INDEPENDENT_AMBULATORY_CARE_PROVIDER_SITE_OTHER): Payer: Medicare Other | Admitting: Neurology

## 2023-04-29 ENCOUNTER — Encounter: Payer: Self-pay | Admitting: Neurology

## 2023-04-29 VITALS — BP 137/75 | HR 55 | Ht 64.0 in | Wt 185.0 lb

## 2023-04-29 DIAGNOSIS — R531 Weakness: Secondary | ICD-10-CM

## 2023-04-29 DIAGNOSIS — G2119 Other drug induced secondary parkinsonism: Secondary | ICD-10-CM

## 2023-04-29 DIAGNOSIS — F319 Bipolar disorder, unspecified: Secondary | ICD-10-CM

## 2023-04-29 DIAGNOSIS — Z8659 Personal history of other mental and behavioral disorders: Secondary | ICD-10-CM

## 2023-04-29 DIAGNOSIS — Z87898 Personal history of other specified conditions: Secondary | ICD-10-CM

## 2023-04-29 DIAGNOSIS — Z8669 Personal history of other diseases of the nervous system and sense organs: Secondary | ICD-10-CM

## 2023-04-29 DIAGNOSIS — F4310 Post-traumatic stress disorder, unspecified: Secondary | ICD-10-CM | POA: Diagnosis not present

## 2023-04-29 NOTE — Patient Instructions (Signed)
Good to see you doing better! Continue current course, regular exercise, physical therapy/balance therapy. Follow-up as needed, call for any changes.

## 2023-04-29 NOTE — Progress Notes (Addendum)
 NEUROLOGY FOLLOW UP OFFICE NOTE  Tiffany [name] [id number] November 08, 1950  HISTORY OF PRESENT ILLNESS: I had the pleasure of seeing Tiffany [name] in follow-up in the neurology clinic on 04/29/2023.  The patient was last seen a year ago for confusional episodes. She is again accompanied by her husband who helps supplement the history today.  Records and images were personally reviewed where available.  Since her last visit, she was admitted for suicidal ideation in 11/2022. She was started on ECT and medication adjustments were done, which she feels helped her more. Last ECT was in 12/2022. She reports mood is stable now, her husband agrees she is much better. She is doing PT for strength and balance. She has no memory of the weeks around her treatment, for instance she did not recall a friend coming to visit. She notes after starting ECT, she had hypnogogic hallucinations, seeing something like a fire behind her bed or seeing a shadow. These have recently quieted down.   There is a brain MRI done 06/2022 with no acute changes, mild to moderate small vessel disease.   Addendum 07/21/2023: On her visit in 05/2022, she was reporting episodic weakness that had been attributed to elevated BP. MRI brain was ordered due to concern for [name] but she was anxious about the prolonged studies and wanted to discuss this first. We agreed that if episodes of weakness continue despite improvement in BP, would proceed with imaging. MRI brain in 06/2022 no acute changes. She has not had any further episodic weakness.   History on Initial Assessment 06/07/2019: This is a pleasant 73 year old right-handed woman with a history of bipolar disorder, PTSD, drug-induced parkinsonism, recently admitted for confusion with abnormal EEG, presenting to establish care. She was admitted to [facility] last 04/21/2019 for worsening gait, confusion, and tremors. She contracted COVID and symptoms significantly worsened, leading to a fall. She does not remember a  lot of it, she apparently fell and called out to her husband. Her husband reported waxing and waning confusion, seemingly worse at night. She had an MRI brain without contrast which I personally reviewed, no acute changes. 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 CBC showed a WBC of 17.8, acute on chronic renal failure creatinine 2.67. Her Lithium  level was 0.87. Lamotrigine  level 15.3.Her Synthroid  and Lamotrigine  were held due to concern this was contributing to tremors/encephalopathy. She had an EEG showing diffuse slowing, at times with triphasic morphology. There were also sharp waves in the left temporoparietal region, maximal at P7/P3, at times periodic for 10-15 seconds without clear evolution and no clinical changes seen. She was restarted on lower dose Lamotrigine  100mg  qhs (previously on 300mg  daily), and low dose Levetiracetam  250mg  BID was added. Renal failure resolved. Synthroid  resumed at lower dose. Sertraline  was added by psychiatry initially, then stopped to streamline medications. She had balance deficits and cognitive deficits with delayed processing and poor awareness of deficits.   She has a history of intermittent Lithium  toxicity, she has been on this for at least 10 years. 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 She has tried benztropine, trihexyphenidyl, Sinemet, selegiline, Requip, Azilect, with minimal benefit. The tremors had significantly improved with medication changes made in the hospital, she now has minimal tremors. She is happy to report that she can now write legibly. She has a history of tardive dyskinesia and was started  on Ingrezza . Initially they felt there was not much benefit after she missed a week due to insurance issues, but they report today that her mouth movements have worsened and would like to resume  Ingrezza . She has been mostly dealing with a lot of pain since hospital discharge, she has severe arthritis pain in her feet, knees, wrist/elbow. Tramadol was not helping. She sees Rheumatology and had been on Cimzia which was also held. She reports pain has been daily but she is not in pain today. She still has loss of taste. She had a migraine this morning, she has a remote history of migraines and has not had any in over a year. She used to take Excedrin but had an itching reaction recently. Her husband denies any staring/unresponsive episodes. She denies any olfactory/gustatory hallucinations, deja vu, rising epigastric sensation, focal numbness/tingling/weakness, myoclonic jerks. She denies any dizziness, diplopia, dysarthria/dysphagia, focal numbness/tingling/weakness, bowel/bladder dysfunction. She has lorazepam  1mg  qhs and takes it as needed during the day for anxiety. She reports depression is better, she denies any suicidal ideation.  Epilepsy Risk Factors:  Her mother had seizures. Otherwise she had a normal birth and early development.  There is no history of febrile convulsions, CNS infections such as meningitis/encephalitis, significant traumatic brain injury, neurosurgical procedures  PAST MEDICAL HISTORY: Past Medical History:  Diagnosis Date   Bipolar 1 disorder (HCC)    Chronic diarrhea    loose stools twice a day on average for years   CKD stage G4/A1, 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 1.4 cm  Grade I invasive lobular, ER and PR +/Her--, Ki 67 <5% Tried anastrozole for one month, but developed suicidal idea   Osteoarthritis, knee 09/24/2019   Xray 09/2019   Parkinsonism (HCC)    Drug induced   Polyposis of colon    Psoriatic arthritis (HCC)    PTSD (post-traumatic  stress disorder)    Secondary hyperparathyroidism (HCC)    Tardive dyskinesia    Thyroid  disease     MEDICATIONS: Current Outpatient Medications on File Prior to Visit  Medication Sig Dispense Refill   amLODipine  (NORVASC ) 5 MG tablet TAKE 1 TABLET(5 MG) BY MOUTH DAILY 90 tablet 1   atorvastatin  (LIPITOR) 10 MG tablet TAKE 1 TABLET(10 MG) BY MOUTH DAILY 90 tablet 1   augmented betamethasone dipropionate (DIPROLENE-AF) 0.05 % cream APPLY TO THE AFFECTED AREAS OF LEG TWICE DAILY AS NEEDED FOR RASH. NOT TO FACE, GROIN, UNDERARMS 50 g 0   cariprazine (VRAYLAR) 1.5 MG capsule TAKE 1 CAPSULE BY MOUTH EVERY MORNING Oral for 30 days     Cholecalciferol  50 MCG (2000 UT) TABS 1 tablet Orally Once a day for 30 day(s)     docusate sodium (COLACE) 100 MG capsule Take 100 mg by mouth 2 (two) times daily.     Golimumab (SIMPONI ARIA IV) Infusion every 8 weeks     ipratropium (ATROVENT ) 0.03 % nasal spray Place 2 sprays into both nostrils every 12 (twelve) hours. 30 mL 12   levothyroxine  (SYNTHROID ) 50 MCG tablet Take 1 tablet (50 mcg total) by mouth as directed. Take 2 tabs on Sundays and Wednesday,  1 tab rest of the week 117 tablet 2   LORazepam  (ATIVAN ) 0.5 MG tablet Take 0.5 mg by mouth 3 (three) times daily as  needed.     metoprolol  succinate (TOPROL  XL) 25 MG 24 hr tablet Take 0.5 tablets (12.5 mg total) by mouth daily. 45 tablet 3   metroNIDAZOLE  (METROGEL ) 0.75 % gel Apply to face 1-2 times daily 45 g 1   mirtazapine (REMERON) 45 MG tablet Take 45 mg by mouth at bedtime.     Omega-3 Fatty Acids (FISH OIL) 1200 MG CAPS Take 1 capsule by mouth in the morning and at bedtime.     Probiotic Product (ALIGN) 4 MG CAPS Take 1 capsule by mouth daily in the afternoon.     No current facility-administered medications on file prior to visit.    ALLERGIES: Allergies  Allergen Reactions   Aripiprazole Other (See Comments)    Parkinsonism      Lactose Intolerance (Gi) Diarrhea   Methotrexate Other (See  Comments)    Hair loss, severe stomatitis     Nsaids     CKD 4   Cefdinir Diarrhea    Other reaction(s): Diarrhea Yeast infection and fever; negative c diff   Etanercept Other (See Comments)    Headaches     Exemestane Other (See Comments)    Suicidal thoughts with medication     Fluoxetine Other (See Comments)    Parkinsonism   Methylprednisolone  Sodium Succ Other (See Comments)    Agitated mania   Other Other (See Comments)    Paper tape causes bruising.   Epinephrine  Palpitations    tachycardia    Nitrofurantoin Other (See Comments)     GI upset from 2012    FAMILY HISTORY: Family History  Problem Relation Age of Onset   Lymphoma Mother 56   Lymphoma Sister 78   Breast cancer Sister 98   Colon polyps Sister    Lung cancer Sister        former smoker   Stroke Maternal Grandfather    Diabetes Paternal Grandfather    Colon cancer Neg Hx    Esophageal cancer Neg Hx    Stomach cancer Neg Hx    Rectal cancer Neg Hx     SOCIAL HISTORY: Social History   Socioeconomic History   Marital status: Married    Spouse name: Alm Jacks   Number of children: 3   Years of education: Not on file   Highest education level: Associate degree: occupational, Scientist, product/process development, or vocational program  Occupational History   Occupation: retired  Tobacco Use   Smoking status: Never   Smokeless tobacco: Never  Vaping Use   Vaping status: Never Used  Substance and Sexual Activity   Alcohol use: Never   Drug use: Never   Sexual activity: Not Currently  Other Topics Concern   Not on file  Social History Narrative   Moved to area from [place]  08/2018   Lives one story home   Right handed.   Social Drivers of Corporate investment banker Strain: Low Risk  (04/24/2023)   Overall Financial Resource Strain (CARDIA)    Difficulty of Paying Living Expenses: Not hard at all  Food Insecurity: No Food Insecurity (04/24/2023)   Hunger Vital Sign    Worried About Running Out of Food in the  Last Year: Never true    Ran Out of Food in the Last Year: Never true  Transportation Needs: No Transportation Needs (04/24/2023)   PRAPARE - Administrator, Civil Service (Medical): No    Lack of Transportation (Non-Medical): No  Physical Activity: Sufficiently Active (04/24/2023)   Exercise Vital Sign  Days of Exercise per Week: 3 days    Minutes of Exercise per Session: 50 min  Recent Concern: Physical Activity - Insufficiently Active (03/23/2023)   Exercise Vital Sign    Days of Exercise per Week: 3 days    Minutes of Exercise per Session: 40 min  Stress: No Stress Concern Present (04/24/2023)   [employer] of Occupational Health - Occupational Stress Questionnaire    Feeling of Stress : Not at all  Social Connections: Moderately Integrated (04/24/2023)   Social Connection and Isolation Panel [NHANES]    Frequency of Communication with Friends and Family: More than three times a week    Frequency of Social Gatherings with Friends and Family: More than three times a week    Attends Religious Services: More than 4 times per year    Active Member of [employer] or Organizations: No    Attends Banker Meetings: Never    Marital Status: Married  Catering manager Violence: Not At Risk (04/24/2023)   Humiliation, Afraid, Rape, and Kick questionnaire    Fear of Current or Ex-Partner: No    Emotionally Abused: No    Physically Abused: No    Sexually Abused: No     PHYSICAL EXAM: Vitals:   04/29/23 1522  BP: 137/75  Pulse: (!) 55  SpO2: 98%   General: No acute distress Head:  Normocephalic/atraumatic Skin/Extremities: No rash, no edema Neurological Exam: alert and awake. No aphasia or dysarthria. Fund of knowledge is appropriate.  Attention and concentration are normal.   Cranial nerves: Pupils equal, round. Extraocular movements intact with no nystagmus. Visual fields full.  No facial asymmetry.  Motor: Bulk and tone normal, no cogwheeling, muscle strength  5/5 throughout with no pronator drift.   Finger to nose testing intact.  Gait narrow-based and steady, able to tandem walk adequately.  Romberg negative. No tremor noted.   IMPRESSION: This is a pleasant 73 yo RH woman with a history of bipolar disorder, PTSD, drug-induced parkinsonism, seen in the past for confusion with metabolic encephalopathy (renal failure at that time). No further similar symptoms. No further episodic weakness. MRI brain in 06/2022 no acute changes. Her neurological exam today is normal. She was admitted for major depression in 11/2022 and has had ECT treatments and medication adjustments made, she is feeling much better. We discussed the importance of continued follow-up with Behavioral Health, regular exercise, PT/balance therapy. Follow-up as needed, call for any changes.   Thank you for allowing me to participate in her care.  Please do not hesitate to call for any questions or concerns.    Rayfield Cairo, M.D.   CC: Alexander Iba, [place]

## 2023-05-01 ENCOUNTER — Ambulatory Visit (HOSPITAL_BASED_OUTPATIENT_CLINIC_OR_DEPARTMENT_OTHER): Payer: Medicare Other | Admitting: Physical Therapy

## 2023-05-01 DIAGNOSIS — R2689 Other abnormalities of gait and mobility: Secondary | ICD-10-CM

## 2023-05-01 DIAGNOSIS — M6281 Muscle weakness (generalized): Secondary | ICD-10-CM

## 2023-05-01 DIAGNOSIS — G8929 Other chronic pain: Secondary | ICD-10-CM

## 2023-05-01 DIAGNOSIS — M25552 Pain in left hip: Secondary | ICD-10-CM

## 2023-05-01 DIAGNOSIS — R2681 Unsteadiness on feet: Secondary | ICD-10-CM

## 2023-05-01 DIAGNOSIS — M25652 Stiffness of left hip, not elsewhere classified: Secondary | ICD-10-CM

## 2023-05-01 NOTE — Therapy (Signed)
OUTPATIENT PHYSICAL THERAPY LOWER EXTREMITY Progress Note    Patient Name: Tiffany [name] MRN: [id number] DOB:1951/01/12, 73 y.o., female Today's Date: 05/02/2023   PT End of Session - 05/01/23 1249     Visit Number 10    Number of Visits 19    Date for PT Re-Evaluation 07/02/23    Authorization Type progress note done at visit 9    PT Start Time 1230    PT Stop Time 1312    PT Time Calculation (min) 42 min    Activity Tolerance Patient tolerated treatment well    Behavior During Therapy [facility] for tasks assessed/performed                  Past Medical History:  Diagnosis Date   Bipolar 1 disorder (HCC)    Chronic diarrhea    loose stools twice a day on average for years   CKD stage G4/A1, 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 1.4 cm  Grade I invasive lobular, ER and PR +/Her--, Ki 67 <5% Tried anastrozole for one month, but developed suicidal idea   Osteoarthritis, knee 09/24/2019   Xray 09/2019   Parkinsonism (HCC)    Drug induced   Polyposis of colon    Psoriatic arthritis (HCC)    PTSD (post-traumatic stress disorder)    Secondary hyperparathyroidism (HCC)    Tardive dyskinesia    Thyroid disease    Past Surgical History:  Procedure Laterality Date   ABDOMINAL HYSTERECTOMY  1987   CHOLECYSTECTOMY  1979   DILATION AND CURETTAGE OF UTERUS  1973   MASTECTOMY Bilateral 09/19/2014   TONSILLECTOMY  1970   URETERAL REIMPLANTION Bilateral 1974   Patient Active Problem List   Diagnosis Date Noted   Raynaud's phenomenon without gangrene 03/26/2023   Family history of breast cancer 06/05/2021   Osteoarthritis, knee 09/24/2019   CKD (chronic kidney disease) stage 4, GFR 15-29 ml/min (HCC) 09/24/2019   Severe recurrent major depression without psychotic features  (HCC) 09/09/2019   Bipolar I disorder, most recent episode depressed (HCC)    Transaminitis    Encephalopathy 04/26/2019   Leukocytosis 04/21/2019   Thrombocytopenia (HCC) 09/04/2018   Vitamin D deficiency 09/04/2018   Psoriatic arthritis (HCC) 09/04/2018   Tubular adenoma of colon 05/28/2017   History of colonic polyps 05/28/2017   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      PCP: Jarold Motto, PA  REFERRING PROVIDER: Jarold Motto, PA  REFERRING DIAG:  [phone] - recurrent pain of right knee  THERAPY DIAG:  Other abnormalities of gait and mobility  Muscle weakness (generalized)  Chronic pain of right knee  Unsteadiness on feet  Pain in left hip  Stiffness of left hip, not elsewhere classified  Rationale for Evaluation and Treatment Rehabilitation  ONSET DATE: 3 weeks ago  SUBJECTIVE:  SUBJECTIVE STATEMENT: The patient reports she has been doing rpetty well. She uis not having any pain today.  PERTINENT HISTORY: Bi-polar, CKD, depression, breast cancer, OA, Parkinson's, Psoriatic Arthritis, PTSD, tardive diskenesia,   PAIN:  Are you having pain? Yes: NPRS scale: 2-3/10 pain  Pain location:IT band  Pain description: aching Aggravating factors: standing, walking , twisting Relieving factors: rest  PRECAUTIONS: None  WEIGHT BEARING RESTRICTIONS No  FALLS:  Has patient fallen in last 6 months? No  LIVING ENVIRONMENT: Lives with husband   OCCUPATION: retired   PLOF: Independent has not had to use a walker  PATIENT GOALS   To have less pain in her knee and to get back into the gym   OBJECTIVE:   DIAGNOSTIC FINDINGS:  Nothing recent   PATIENT SURVEYS:   COGNITION:  Overall cognitive status: Within functional limits for tasks assessed     SENSATION: WFL   POSTURE: rounded shoulders and forward head  PALPATION: Trigger points in lower IT band   LOWER EXTREMITY ROM:  Active PROM Right eval Left eval Right  2/12  Hip flexion     Hip extension     Hip abduction     Hip adduction     Hip internal rotation     Hip external rotation     Knee flexion     Knee extension -7  -5  Ankle dorsiflexion     Ankle plantarflexion     Ankle inversion     Ankle eversion      (Blank rows = not tested)  LOWER EXTREMITY MMT:  MMT Right eval Left eval Right  2/12 Left 2/12   Hip flexion 17.7  17.8 28.5 38.8  Hip extension      Hip abduction 11.1 20.4 32.3 40.4  Hip adduction      Hip internal rotation      Hip external rotation      Knee flexion      Knee extension 18.8 17.3 38.5 51.8  Ankle dorsiflexion      Ankle plantarflexion      Ankle inversion      Ankle eversion       (Blank rows = not tested)   GAIT: Decreased single leg stance time on the right    TODAY'S TREATMENT:  2/20 Manual: trigger point release to IT band; PA and AP mobilization to improve extension  There-ex:  SAQ 3x12  SLR 3x10     NuStep 5 minutes reviewed set up level for  Hip abduction machine LF 3x15 70 lbs   Neuro re-ed Cable row with TA breathing 3x10 15 lbs  Step on and off air-ex x20  Narrow base 3x30 sesc hold with CGA    2/12  Manual: trigger point release to IT band; PA and AP mobilization to improve extension  There-ex:  Quad set 2x15  SLR 3x10  Reviewed strength measurements    NuStep 5 minutes reviewed set up level for  Triceps press down life fitness 40 pounds 3 x 12 Cable row with  abdominal breathing 15 lbs 3x12    1/29  Manual: trigger point release to IT band; PA and AP mobilization to improve      Leg press 50 lbs 3x10  Hip abdcution 3x10 55 lbs   NuStep 5 minutes reviewed set up level for  Triceps press down life fitness 30 pounds 3 x 12 Cybex row machine 30 pounds 3 x 12    1/17 Nu-step 5 min  LTR x20  Gluteal stretch 3x20 sec hold   Bridge 3x10   SLR 3x10   Cable:  Row 3x10 15 lbs  Extensions 3x12 15 lbs   Step ups:  4 inch 3x10   Leg press 50 lbs 3x10  Hip abdcution 3x10 55 lbs       1/09  Manual: Knee extension ROM; trigger point release to lateral / medial knee and IT band   LF hip abduction machine 3x10 55 lbs   Row machine 3x12 15 lbs   Triceps pushdown 3x12 20 lbs   Step down 2x10 each leg  Step up 2 inch 2x10    1/2  Manual: Knee extension ROM; trigger point release to lateral / medial knee   Quad set 2x15  Hip abduction 3x15 red  Bridge 2x10   LF row machine 2x15 20 lbs  Triceps press down 2x15 20 lbs   12/19 Manual: Knee extension ROM; trigger point release to lateral knee   LF hip abduction machine 3x10   Cybex leg press 3 x 15 40 pounds  Row machine 3x12 15 lbs   Narrow base air-ex 2x30 sec eyes closed   Tandem stance on air-ex 2x30 sec hold     12/5 Manual: Knee extension ROM; trigger point release to lateral knee   LF hip abduction machine 3x10   Cybex leg press 3 x 15 40 pounds  Quad set x 20 Straight leg raise 3 x 10 Short arc quad 3 x 10   11/22/ SAQ 3x12  Supine march 3x12  Hip abduction 3x15 red     Manual: Knee extension ROM; trigger point release to lateral knee   Eval: Manual: Knee extension ROM; trigger point release to lateral knee    PATIENT EDUCATION:  Education details: reviewed HEP; symptom management, activity progression  Person educated: Patient Education method: Explanation, Demonstration, Tactile cues, Verbal cues, and Handouts Education  comprehension: verbalized understanding, returned demonstration, verbal cues required, tactile cues required, and needs further education   HOME EXERCISE PROGRAM: Access Code: [email or web address] URL: [email or web address] Date: 01/31/2023 Prepared by: Lorayne Bender  Exercises - Supine Heel Slide with Strap  - 1 x daily - 7 x weekly - 3 sets - 5 reps - 5 sec  hold - Supine Quad Set  - 1 x daily - 7 x weekly - 3 sets - 10 reps - Supine Knee Extension Stretch on Towel Roll  - 1 x daily - 7 x weekly - 3 sets - 5 reps - 5 sec  hold  ASSESSMENT:  CLINICAL IMPRESSION: Therapy adbvanced the patients weights with leg press and hip abduction. We also focused on balance o the air-ex. We will continue to progress as tolerated.   Eval: Patient is a 73 y.o. female who was seen today for physical therapy evaluation and treatment for right knee pain. She has had improved knee pain and mobility in the past with PT. She returns today with an acute exacerbation of her knee pain.  She continues on limited knee extension but no significant decline from her previous episode.  She has decreased balance with narrow base with eyes closed.  She is working on her pool program on her own.  She would benefit from skilled therapy to improve balance and continue to work on knee extension. OBJECTIVE IMPAIRMENTS Abnormal gait, decreased activity tolerance, decreased endurance, decreased mobility, difficulty walking, decreased ROM, decreased strength, increased fascial restrictions, and pain.   ACTIVITY LIMITATIONS carrying, bending, standing, squatting, stairs, transfers, and locomotion level  PARTICIPATION  LIMITATIONS: cleaning, laundry, shopping, community activity, and yard work  PERSONAL FACTORS 3+ comorbidities: Psoriatic arthritis, Bi-polar , Parkinsons  are also affecting patient's functional outcome.   REHAB POTENTIAL: Good  CLINICAL DECISION MAKING: Evolving/moderate complexity  EVALUATION COMPLEXITY:  Moderate   GOALS: Goals reviewed with patient? Yes  SHORT TERM GOALS: Target date: 05/02/2023     Patient will increase gross right LE strength by 5 lbs  Baseline: Goal status: INITIAL  2.  Patient will be increase knee extension passive by 3 degrees  Baseline:  Goal status: Progressing 1/10  3.  Patient will be independent with basic HEP  Baseline:  Goal status: INITIAL  LONG TERM GOALS: Target date: 03/20/2023    Patient will stand for 20 min without pain in order to perform her ADL's  Baseline:  Goal status: Achieved 1/20  2.  Patient will be independent with full gym program  Baseline:  Goal status: Progressing 1/9  3.  Patient will ambulate without increased pain community distances  Baseline:  Goal status: INITIAL    PLAN: PT FREQUENCY: 2x/week  PT DURATION: 8 weeks  PLANNED INTERVENTIONS: Therapeutic exercises, Therapeutic activity, Neuromuscular re-education, Balance training, Gait training, Patient/Family education, Joint mobilization, Stair training, DME instructions, Aquatic Therapy, Dry Needling, Electrical stimulation, Cryotherapy, Moist heat, Ultrasound, and Manual therapy  PLAN FOR NEXT SESSION: continue with manual therapy to the knee; progress to standing exercises; progress back to gym activity as able.    Dessie Coma, PT 05/02/2023, 8:16 AM

## 2023-05-02 ENCOUNTER — Encounter (HOSPITAL_BASED_OUTPATIENT_CLINIC_OR_DEPARTMENT_OTHER): Payer: Self-pay | Admitting: Physical Therapy

## 2023-05-08 ENCOUNTER — Ambulatory Visit (HOSPITAL_BASED_OUTPATIENT_CLINIC_OR_DEPARTMENT_OTHER): Payer: Medicare Other | Admitting: Physical Therapy

## 2023-05-08 DIAGNOSIS — M6281 Muscle weakness (generalized): Secondary | ICD-10-CM

## 2023-05-08 DIAGNOSIS — R2689 Other abnormalities of gait and mobility: Secondary | ICD-10-CM

## 2023-05-08 DIAGNOSIS — M25652 Stiffness of left hip, not elsewhere classified: Secondary | ICD-10-CM

## 2023-05-08 DIAGNOSIS — R2681 Unsteadiness on feet: Secondary | ICD-10-CM

## 2023-05-08 DIAGNOSIS — M25552 Pain in left hip: Secondary | ICD-10-CM

## 2023-05-08 DIAGNOSIS — G8929 Other chronic pain: Secondary | ICD-10-CM

## 2023-05-08 NOTE — Therapy (Signed)
 OUTPATIENT PHYSICAL THERAPY LOWER EXTREMITY Progress Note    Patient Name: Tiffany [name] MRN: [id number] DOB:[birth year], 73 y.o., female Today's Date: 05/08/2023   PT End of Session - 05/08/23 1229     Visit Number 11    Number of Visits 19    Date for PT Re-Evaluation 07/02/23    Authorization Type progress note done at visit 9    PT Start Time 1145    PT Stop Time 1226    PT Time Calculation (min) 41 min    Equipment Utilized During Treatment Gait belt    Activity Tolerance Patient tolerated treatment well    Behavior During Therapy WFL for tasks assessed/performed             Past Medical History:  Diagnosis Date   Bipolar 1 disorder (HCC)    Chronic diarrhea    loose stools twice a day on average for years   CKD stage G4/A1, 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 1.4 cm  Grade I invasive lobular, ER and PR +/Her--, Ki 67 <5% Tried anastrozole for one month, but developed suicidal idea   Osteoarthritis, knee 09/24/2019   Xray 09/2019   Parkinsonism (HCC)    Drug induced   Polyposis of colon    Psoriatic arthritis (HCC)    PTSD (post-traumatic stress disorder)    Secondary hyperparathyroidism (HCC)    Tardive dyskinesia    Thyroid disease    Past Surgical History:  Procedure Laterality Date   ABDOMINAL HYSTERECTOMY  1987   CHOLECYSTECTOMY  1979   DILATION AND CURETTAGE OF UTERUS  1973   MASTECTOMY Bilateral 09/19/2014   TONSILLECTOMY  1970   URETERAL REIMPLANTION Bilateral 1974   Patient Active Problem List   Diagnosis Date Noted   Raynaud's phenomenon without gangrene 03/26/2023   Family history of breast cancer 06/05/2021   Osteoarthritis, knee 09/24/2019   CKD (chronic kidney disease) stage 4, GFR 15-29 ml/min (HCC) 09/24/2019   Severe recurrent  major depression without psychotic features (HCC) 09/09/2019   Bipolar I disorder, most recent episode depressed (HCC)    Transaminitis    Encephalopathy 04/26/2019   Leukocytosis 04/21/2019   Thrombocytopenia (HCC) 09/04/2018   Vitamin D deficiency 09/04/2018   Psoriatic arthritis (HCC) 09/04/2018   Tubular adenoma of colon 05/28/2017   History of colonic polyps 05/28/2017   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      PCP: Jarold Motto, PA  REFERRING PROVIDER: Jarold Motto, PA  REFERRING DIAG:  [phone] - recurrent pain of right knee  THERAPY DIAG:  Other abnormalities of gait and mobility  Muscle weakness (generalized)  Chronic pain of right knee  Stiffness of left hip, not elsewhere classified  Pain in left hip  Unsteadiness on feet  Rationale for Evaluation and Treatment Rehabilitation  ONSET DATE: 3 weeks ago  SUBJECTIVE:   SUBJECTIVE STATEMENT: The patient reports she has been doing pretty well. She uis not having any pain today.  PERTINENT HISTORY: Bi-polar, CKD, depression, breast cancer, OA, Parkinson's, Psoriatic Arthritis, PTSD, tardive diskenesia,   PAIN:  Are you having pain? Yes: NPRS scale: 2-3/10 pain  Pain location:IT band  Pain description: aching Aggravating factors: standing, walking , twisting Relieving factors: rest  PRECAUTIONS: None  WEIGHT BEARING RESTRICTIONS No  FALLS:  Has patient fallen in last 6 months?  No  LIVING ENVIRONMENT: Lives with husband  OCCUPATION: retired   PLOF: Independent has not had to use a walker  PATIENT GOALS   To have less pain in her knee and to get back into the gym   OBJECTIVE:   DIAGNOSTIC FINDINGS:  Nothing recent   PATIENT SURVEYS:   COGNITION:  Overall cognitive status: Within functional limits for tasks assessed     SENSATION: WFL   POSTURE: rounded shoulders and forward head  PALPATION: Trigger points in lower IT band   LOWER EXTREMITY ROM:  Active PROM Right eval Left eval Right  2/12  Hip flexion     Hip extension     Hip abduction     Hip adduction     Hip internal rotation     Hip external rotation     Knee flexion     Knee extension -7  -5  Ankle dorsiflexion     Ankle plantarflexion     Ankle inversion     Ankle eversion      (Blank rows = not tested)  LOWER EXTREMITY MMT:  MMT Right eval Left eval Right  2/12 Left 2/12   Hip flexion 17.7  17.8 28.5 38.8  Hip extension      Hip abduction 11.1 20.4 32.3 40.4  Hip adduction      Hip internal rotation      Hip external rotation      Knee flexion      Knee extension 18.8 17.3 38.5 51.8  Ankle dorsiflexion      Ankle plantarflexion      Ankle inversion      Ankle eversion       (Blank rows = not tested)   GAIT: Decreased single leg stance time on the right    TODAY'S TREATMENT: 02/27 Nustep lvl 6, 5 min Hip abduction machine LF 3x15 70 lbs   Neuro re-ed Cable row with TA breathing 3x10 15 lbs  Step on and off air-ex x20  Counting down backwards in 2's from 100 while walking forward, backwards and listening to directions to stop/ start, and turn.   There act  Stair negotiation with focus on alternating pattern Mimicking putting dishes away at sink while turning in different directions and listening to PT demands tasks.    2/20 Manual: trigger point release to IT band; PA and AP mobilization to improve extension  There-ex:  SAQ 3x12  SLR  3x10     NuStep 5 minutes reviewed set up level for  Hip abduction machine LF 3x15 70 lbs   Neuro re-ed Cable row with TA breathing 3x10 15 lbs  Step on and off air-ex x20  Narrow base 3x30 sesc hold with CGA    2/12  Manual: trigger point release to IT band; PA and AP mobilization to improve extension  There-ex:  Quad set 2x15  SLR 3x10  Reviewed strength measurements    NuStep 5 minutes reviewed set up level for  Triceps press down life  fitness 40 pounds 3 x 12 Cable row with abdominal breathing 15 lbs 3x12    1/29  Manual: trigger point release to IT band; PA and AP mobilization to improve      Leg press 50 lbs 3x10  Hip abdcution 3x10 55 lbs   NuStep 5 minutes reviewed set up level for  Triceps press down life fitness 30 pounds 3 x 12 Cybex row machine 30 pounds 3 x 12    1/17 Nu-step 5 min  LTR x20   Gluteal stretch 3x20 sec hold   Bridge 3x10   SLR 3x10   Cable:  Row 3x10 15 lbs  Extensions 3x12 15 lbs   Step ups:  4 inch 3x10   Leg press 50 lbs 3x10  Hip abdcution 3x10 55 lbs       1/09  Manual: Knee extension ROM; trigger point release to lateral / medial knee and IT band   LF hip abduction machine 3x10 55 lbs   Row machine 3x12 15 lbs   Triceps pushdown 3x12 20 lbs   Step down 2x10 each leg  Step up 2 inch 2x10    1/2  Manual: Knee extension ROM; trigger point release to lateral / medial knee   Quad set 2x15  Hip abduction 3x15 red  Bridge 2x10   LF row machine 2x15 20 lbs  Triceps press down 2x15 20 lbs   12/19 Manual: Knee extension ROM; trigger point release to lateral knee   LF hip abduction machine 3x10   Cybex leg press 3 x 15 40 pounds  Row machine 3x12 15 lbs   Narrow base air-ex 2x30 sec eyes closed   Tandem stance on air-ex 2x30 sec hold     12/5 Manual: Knee extension ROM; trigger point release to lateral knee   LF hip abduction machine 3x10   Cybex leg press 3 x 15 40  pounds  Quad set x 20 Straight leg raise 3 x 10 Short arc quad 3 x 10   11/22/ SAQ 3x12  Supine march 3x12  Hip abduction 3x15 red     Manual: Knee extension ROM; trigger point release to lateral knee   Eval: Manual: Knee extension ROM; trigger point release to lateral knee    PATIENT EDUCATION:  Education details: reviewed HEP; symptom management, activity progression  Person educated: Patient Education method: Explanation, Demonstration, Tactile cues, Verbal cues, and Handouts Education comprehension: verbalized understanding, returned demonstration, verbal cues required, tactile cues required, and needs further education   HOME EXERCISE PROGRAM: Access Code: [email or web address] URL: [email or web address] Date: 01/31/2023 Prepared by: Lorayne Bender  Exercises - Supine Heel Slide with Strap  - 1 x daily - 7 x weekly - 3 sets - 5 reps - 5 sec  hold - Supine Quad Set  - 1 x daily - 7 x weekly - 3 sets - 10 reps - Supine Knee Extension Stretch on Towel Roll  - 1 x daily - 7 x weekly - 3 sets - 5 reps - 5 sec  hold  ASSESSMENT:  CLINICAL IMPRESSION: Challenged pt with cognitive tasks with gait, mimicked tasks required at home that prove difficult. Pt tolerated all exercises well with no LOB and only minor fatigue.   Eval: Patient is a 73 y.o. female who was seen today for physical therapy evaluation and treatment for right knee pain. She has had improved knee pain and mobility in the past with PT. She returns today with an acute exacerbation of her knee pain.  She continues on limited knee extension but no significant decline from her previous episode.  She has decreased balance with narrow base with eyes closed.  She is working on her pool program on her own.  She would benefit from skilled therapy to improve balance and continue to work on knee extension. OBJECTIVE IMPAIRMENTS Abnormal gait, decreased activity tolerance, decreased endurance, decreased mobility,  difficulty walking, decreased ROM, decreased strength, increased fascial restrictions, and pain.   ACTIVITY LIMITATIONS carrying, bending, standing, squatting, stairs, transfers, and locomotion level  PARTICIPATION LIMITATIONS: cleaning, laundry, shopping, community activity, and yard work  PERSONAL FACTORS 3+ comorbidities: Psoriatic arthritis, Bi-polar , Parkinsons  are also affecting patient's functional outcome.   REHAB POTENTIAL: Good  CLINICAL DECISION MAKING: Evolving/moderate complexity  EVALUATION COMPLEXITY: Moderate   GOALS: Goals reviewed with patient? Yes  SHORT TERM GOALS: Target date: 05/02/2023     Patient will increase gross right LE strength by 5 lbs  Baseline: Goal status: INITIAL  2.  Patient will be increase knee extension passive by 3 degrees  Baseline:  Goal status: Progressing 1/10  3.  Patient will be independent with basic HEP  Baseline:  Goal status: INITIAL  LONG TERM GOALS: Target date: 03/20/2023    Patient will stand for 20 min without pain in order to perform her ADL's  Baseline:  Goal status: Achieved 1/20  2.  Patient will be independent with full gym program  Baseline:  Goal status: Progressing 1/9  3.  Patient will ambulate without increased pain community distances  Baseline:  Goal status: INITIAL    PLAN: PT FREQUENCY: 2x/week  PT DURATION: 8 weeks  PLANNED INTERVENTIONS: Therapeutic exercises, Therapeutic activity, Neuromuscular re-education, Balance training, Gait training, Patient/Family education, Joint mobilization, Stair training, DME instructions, Aquatic Therapy, Dry Needling, Electrical stimulation, Cryotherapy, Moist heat, Ultrasound, and Manual therapy  PLAN FOR NEXT SESSION: continue with manual therapy to the knee; progress to standing exercises; progress back to gym activity as able.    Champ Mungo, PT 05/08/2023, 12:31 PM

## 2023-05-16 NOTE — Progress Notes (Incomplete)
 NEUROLOGY FOLLOW UP OFFICE NOTE  Tiffany [name] [id number] 08-24-1950  HISTORY OF PRESENT ILLNESS: I had the pleasure of seeing Tiffany [name] in follow-up in the neurology clinic on 04/29/2023.  The patient was last seen a year ago for confusional episodes. She is again accompanied by her husband who helps supplement the history today.  Records and images were personally reviewed where available.  Since her last visit, she was admitted for suicidal ideation in 11/2022. She was started on ECT and medication adjustments were done, which she feels helped her more. Last ECT was in 12/2022. She reports mood is stable now, her husband agrees she is much better. She is doing PT for strength and balance. She has no memory of the weeks around her treatment, for instance she did not recall a friend coming to visit. She notes after starting ECT, she had hypnogogic hallucinations, seeing something like a fire behind her bed or seeing a shadow. These have recently quieted down.   There is a brain MRI done 06/2022 with no acute changes, mild to moderate small vessel disease.    History on Initial Assessment 06/07/2019: This is a pleasant 73 year old right-handed woman with a history of bipolar disorder, PTSD, drug-induced parkinsonism, recently admitted for confusion with abnormal EEG, presenting to establish care. She was admitted to [facility] last 04/21/2019 for worsening gait, confusion, and tremors. She contracted COVID and symptoms significantly worsened, leading to a fall. She does not remember a lot of it, she apparently fell and called out to her husband. Her husband reported waxing and waning confusion, seemingly worse at night. She had an MRI brain without contrast which I personally reviewed, no acute changes. 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 CBC showed a WBC of 17.8, acute on chronic renal failure  creatinine 2.67. Her Lithium level was 0.87. Lamotrigine level 15.3.Her Synthroid and Lamotrigine were held due to concern this was contributing to tremors/encephalopathy. She had an EEG showing diffuse slowing, at times with triphasic morphology. There were also sharp waves in the left temporoparietal region, maximal at P7/P3, at times periodic for 10-15 seconds without clear evolution and no clinical changes seen. She was restarted on lower dose Lamotrigine 100mg  qhs (previously on 300mg  daily), and low dose Levetiracetam 250mg  BID was added. Renal failure resolved. Synthroid resumed at lower dose. Sertraline was added by psychiatry initially, then stopped to streamline medications. She had balance deficits and cognitive deficits with delayed processing and poor awareness of deficits.   She has a history of intermittent Lithium toxicity, she has been on this for at least 10 years. 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 She has tried benztropine, trihexyphenidyl, Sinemet, selegiline, Requip, Azilect, with minimal benefit. The tremors had significantly improved with medication changes made in the hospital, she now has minimal tremors. She is happy to report that she can now write legibly. She has a history of tardive dyskinesia and was started on Ingrezza. Initially they felt there was not much benefit after she missed a week due to insurance issues, but they report today that her mouth movements have worsened and would like to resume Ingrezza. She has been mostly dealing with a lot of pain since hospital discharge, she has severe arthritis pain in her feet, knees, wrist/elbow. Tramadol was not helping. She sees Rheumatology and had been on Cimzia which was also held.  She reports pain has been daily but she is not in pain today. She still has loss of taste. She had a migraine this morning, she has a remote history of migraines and has not had any in over a year. She  used to take Excedrin but had an itching reaction recently. Her husband denies any staring/unresponsive episodes. She denies any olfactory/gustatory hallucinations, deja vu, rising epigastric sensation, focal numbness/tingling/weakness, myoclonic jerks. She denies any dizziness, diplopia, dysarthria/dysphagia, focal numbness/tingling/weakness, bowel/bladder dysfunction. She has lorazepam 1mg  qhs and takes it as needed during the day for anxiety. She reports depression is better, she denies any suicidal ideation.  Epilepsy Risk Factors:  Her mother had seizures. Otherwise she had a normal birth and early development.  There is no history of febrile convulsions, CNS infections such as meningitis/encephalitis, significant traumatic brain injury, neurosurgical procedures  PAST MEDICAL HISTORY: Past Medical History:  Diagnosis Date  . Bipolar 1 disorder (HCC)   . Chronic diarrhea    loose stools twice a day on average for years  . CKD stage G4/A1, GFR 15-29 and albumin creatinine ratio <30 mg/g (HCC)   . Depression 1987  . Malignant neoplasm of overlapping sites of left breast in female, estrogen receptor positive (HCC) 03/14/2016   Dx in 09/2014, s/p bilateral mastectomies and ALND, 0/10 LN. 1.4 cm  Grade I invasive lobular, ER and PR +/Her--, Ki 67 <5% Tried anastrozole for one month, but developed suicidal idea  . Osteoarthritis, knee 09/24/2019   Xray 09/2019  . Parkinsonism (HCC)    Drug induced  . Polyposis of colon   . Psoriatic arthritis (HCC)   . PTSD (post-traumatic stress disorder)   . Secondary hyperparathyroidism (HCC)   . Tardive dyskinesia   . Thyroid disease     MEDICATIONS: Current Outpatient Medications on File Prior to Visit  Medication Sig Dispense Refill  . amLODipine (NORVASC) 5 MG tablet TAKE 1 TABLET(5 MG) BY MOUTH DAILY 90 tablet 1  . atorvastatin (LIPITOR) 10 MG tablet TAKE 1 TABLET(10 MG) BY MOUTH DAILY 90 tablet 1  . augmented betamethasone dipropionate  (DIPROLENE-AF) 0.05 % cream APPLY TO THE AFFECTED AREAS OF LEG TWICE DAILY AS NEEDED FOR RASH. NOT TO FACE, GROIN, UNDERARMS 50 g 0  . cariprazine (VRAYLAR) 1.5 MG capsule TAKE 1 CAPSULE BY MOUTH EVERY MORNING Oral for 30 days    . Cholecalciferol 50 MCG (2000 UT) TABS 1 tablet Orally Once a day for 30 day(s)    . docusate sodium (COLACE) 100 MG capsule Take 100 mg by mouth 2 (two) times daily.    . Golimumab (SIMPONI ARIA IV) Infusion every 8 weeks    . ipratropium (ATROVENT) 0.03 % nasal spray Place 2 sprays into both nostrils every 12 (twelve) hours. 30 mL 12  . levothyroxine (SYNTHROID) 50 MCG tablet Take 1 tablet (50 mcg total) by mouth as directed. Take 2 tabs on Sundays and Wednesday,  1 tab rest of the week 117 tablet 2  . LORazepam (ATIVAN) 0.5 MG tablet Take 0.5 mg by mouth 3 (three) times daily as needed.    . metoprolol succinate (TOPROL XL) 25 MG 24 hr tablet Take 0.5 tablets (12.5 mg total) by mouth daily. 45 tablet 3  . metroNIDAZOLE (METROGEL) 0.75 % gel Apply to face 1-2 times daily 45 g 1  . mirtazapine (REMERON) 45 MG tablet Take 45 mg by mouth at bedtime.    . Omega-3 Fatty Acids (FISH OIL) 1200 MG CAPS Take 1 capsule by mouth in  the morning and at bedtime.    . Probiotic Product (ALIGN) 4 MG CAPS Take 1 capsule by mouth daily in the afternoon.     No current facility-administered medications on file prior to visit.    ALLERGIES: Allergies  Allergen Reactions  . Aripiprazole Other (See Comments)    Parkinsonism     . Lactose Intolerance (Gi) Diarrhea  . Methotrexate Other (See Comments)    Hair loss, severe stomatitis    . Nsaids     CKD 4  . Cefdinir Diarrhea    Other reaction(s): Diarrhea Yeast infection and fever; negative c diff  . Etanercept Other (See Comments)    Headaches    . Exemestane Other (See Comments)    Suicidal thoughts with medication    . Fluoxetine Other (See Comments)    Parkinsonism  . Methylprednisolone Sodium Succ Other (See  Comments)    Agitated mania  . Other Other (See Comments)    Paper tape causes bruising.  Marland Kitchen Epinephrine Palpitations    tachycardia   . Nitrofurantoin Other (See Comments)     GI upset from 2012    FAMILY HISTORY: Family History  Problem Relation Age of Onset  . Lymphoma Mother 31  . Lymphoma Sister 39  . Breast cancer Sister 23  . Colon polyps Sister   . Lung cancer Sister        former smoker  . Stroke Maternal Grandfather   . Diabetes Paternal Grandfather   . Colon cancer Neg Hx   . Esophageal cancer Neg Hx   . Stomach cancer Neg Hx   . Rectal cancer Neg Hx     SOCIAL HISTORY: Social History   Socioeconomic History  . Marital status: Married    Spouse name: Koren Bound  . Number of children: 3  . Years of education: Not on file  . Highest education level: Associate degree: occupational, Scientist, product/process development, or vocational program  Occupational History  . Occupation: retired  Tobacco Use  . Smoking status: Never  . Smokeless tobacco: Never  Vaping Use  . Vaping status: Never Used  Substance and Sexual Activity  . Alcohol use: Never  . Drug use: Never  . Sexual activity: Not Currently  Other Topics Concern  . Not on file  Social History Narrative   Moved to area from [place] 08/2018   Lives one story home   Right handed.   Social Drivers of Health   Financial Resource Strain: Low Risk  (04/24/2023)   Overall Financial Resource Strain (CARDIA)   . Difficulty of Paying Living Expenses: Not hard at all  Food Insecurity: No Food Insecurity (04/24/2023)   Hunger Vital Sign   . Worried About Programme researcher, broadcasting/film/video in the Last Year: Never true   . Ran Out of Food in the Last Year: Never true  Transportation Needs: No Transportation Needs (04/24/2023)   PRAPARE - Transportation   . Lack of Transportation (Medical): No   . Lack of Transportation (Non-Medical): No  Physical Activity: Sufficiently Active (04/24/2023)   Exercise Vital Sign   . Days of Exercise per Week: 3 days    . Minutes of Exercise per Session: 50 min  Recent Concern: Physical Activity - Insufficiently Active (03/23/2023)   Exercise Vital Sign   . Days of Exercise per Week: 3 days   . Minutes of Exercise per Session: 40 min  Stress: No Stress Concern Present (04/24/2023)   [employer] of Occupational Health - Occupational Stress Questionnaire   . Feeling  of Stress : Not at all  Social Connections: Moderately Integrated (04/24/2023)   Social Connection and Isolation Panel [NHANES]   . Frequency of Communication with Friends and Family: More than three times a week   . Frequency of Social Gatherings with Friends and Family: More than three times a week   . Attends Religious Services: More than 4 times per year   . Active Member of Clubs or Organizations: No   . Attends Banker Meetings: Never   . Marital Status: Married  Catering manager Violence: Not At Risk (04/24/2023)   Humiliation, Afraid, Rape, and Kick questionnaire   . Fear of Current or Ex-Partner: No   . Emotionally Abused: No   . Physically Abused: No   . Sexually Abused: No     PHYSICAL EXAM: Vitals:   04/29/23 1522  BP: 137/75  Pulse: (!) 55  SpO2: 98%   General: No acute distress Head:  Normocephalic/atraumatic Skin/Extremities: No rash, no edema Neurological Exam: alert and awake. No aphasia or dysarthria. Fund of knowledge is appropriate.  Attention and concentration are normal.   Cranial nerves: Pupils equal, round. Extraocular movements intact with no nystagmus. Visual fields full.  No facial asymmetry.  Motor: Bulk and tone normal, no cogwheeling, muscle strength 5/5 throughout with no pronator drift.   Finger to nose testing intact.  Gait narrow-based and steady, able to tandem walk adequately.  Romberg negative. No tremor noted.   IMPRESSION: This is a pleasant 73 yo RH woman with a history of bipolar disorder, PTSD, drug-induced parkinsonism, seen in the past for confusion with metabolic  encephalopathy (renal failure at that time). No further similar symptoms. She presents today for evaluation of episodes of diffuse weakness in the setting of shortness of breath and elevated BP. She is also reporting balance issues. Her neurological exam today is normal, no parkinsonian signs, no focal symptoms noted. We discussed continued follow-up with PCP and Cardiology, monitor BP when symptomatic. If episodes continue to occur despite improved BP control, would proceed with brain MRI without contrast at that point. Can hold off on MRA at this time. May take prn lorazepam prior to MRI. Follow-up in 6 months, call for any changes.    Thank you for allowing me to participate in *** care.  Please do not hesitate to call for any questions or concerns.  The duration of this appointment visit was *** minutes of face-to-face time with the patient.  Greater than 50% of this time was spent in counseling, explanation of diagnosis, planning of further management, and coordination of care.   Patrcia Dolly, M.D.   CC: ***

## 2023-05-21 ENCOUNTER — Encounter (HOSPITAL_BASED_OUTPATIENT_CLINIC_OR_DEPARTMENT_OTHER): Payer: Self-pay | Admitting: Physical Therapy

## 2023-05-21 ENCOUNTER — Ambulatory Visit (HOSPITAL_BASED_OUTPATIENT_CLINIC_OR_DEPARTMENT_OTHER): Payer: Medicare Other | Attending: Physician Assistant | Admitting: Physical Therapy

## 2023-05-21 DIAGNOSIS — R2681 Unsteadiness on feet: Secondary | ICD-10-CM | POA: Insufficient documentation

## 2023-05-21 DIAGNOSIS — M25561 Pain in right knee: Secondary | ICD-10-CM | POA: Diagnosis present

## 2023-05-21 DIAGNOSIS — M6281 Muscle weakness (generalized): Secondary | ICD-10-CM | POA: Diagnosis present

## 2023-05-21 DIAGNOSIS — G8929 Other chronic pain: Secondary | ICD-10-CM | POA: Diagnosis present

## 2023-05-21 DIAGNOSIS — R2689 Other abnormalities of gait and mobility: Secondary | ICD-10-CM | POA: Insufficient documentation

## 2023-05-21 DIAGNOSIS — M25552 Pain in left hip: Secondary | ICD-10-CM | POA: Insufficient documentation

## 2023-05-21 DIAGNOSIS — M25652 Stiffness of left hip, not elsewhere classified: Secondary | ICD-10-CM | POA: Insufficient documentation

## 2023-05-21 NOTE — Therapy (Unsigned)
 OUTPATIENT PHYSICAL THERAPY LOWER EXTREMITY Progress Note    Patient Name: MONSERATH NEFF MRN: [id number] DOB:18-Mar-1950, 73 y.o., female Today's Date: 05/21/2023     Past Medical History:  Diagnosis Date   Bipolar 1 disorder (HCC)    Chronic diarrhea    loose stools twice a day on average for years   CKD stage G4/A1, 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 1.4 cm  Grade I invasive lobular, ER and PR +/Her--, Ki 67 <5% Tried anastrozole for one month, but developed suicidal idea   Osteoarthritis, knee 09/24/2019   Xray 09/2019   Parkinsonism (HCC)    Drug induced   Polyposis of colon    Psoriatic arthritis (HCC)    PTSD (post-traumatic stress disorder)    Secondary hyperparathyroidism (HCC)    Tardive dyskinesia    Thyroid disease    Past Surgical History:  Procedure Laterality Date   ABDOMINAL HYSTERECTOMY  1987   CHOLECYSTECTOMY  1979   DILATION AND CURETTAGE OF UTERUS  1973   MASTECTOMY Bilateral 09/19/2014   TONSILLECTOMY  1970   URETERAL REIMPLANTION Bilateral 1974   Patient Active Problem List   Diagnosis Date Noted   Raynaud's phenomenon without gangrene 03/26/2023   Family history of breast cancer 06/05/2021   Osteoarthritis, knee 09/24/2019   CKD (chronic kidney disease) stage 4, GFR 15-29 ml/min (HCC) 09/24/2019   Severe recurrent major depression without psychotic features (HCC) 09/09/2019   Bipolar I disorder, most recent episode depressed (HCC)    Transaminitis    Encephalopathy 04/26/2019   Leukocytosis 04/21/2019   Thrombocytopenia (HCC) 09/04/2018   Vitamin D deficiency 09/04/2018   Psoriatic arthritis (HCC) 09/04/2018   Tubular adenoma of colon 05/28/2017   History of colonic polyps 05/28/2017   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      PCP: Jarold Motto, PA  REFERRING PROVIDER: Jarold Motto, PA  REFERRING DIAG:  [phone] - recurrent pain of right knee  THERAPY DIAG:  No diagnosis found.  Rationale for Evaluation and Treatment Rehabilitation  ONSET DATE: 3 weeks ago  SUBJECTIVE:   SUBJECTIVE STATEMENT: The patient reports she has been doing pretty well. She uis not having any pain today.  PERTINENT HISTORY: Bi-polar, CKD, depression, breast cancer, OA, Parkinson's, Psoriatic Arthritis, PTSD, tardive diskenesia,   PAIN:  Are you having pain? Yes: NPRS scale: 2-3/10 pain  Pain location:IT band  Pain description: aching Aggravating factors: standing, walking , twisting Relieving factors: rest  PRECAUTIONS: None  WEIGHT BEARING RESTRICTIONS No  FALLS:  Has patient fallen in last 6 months? No  LIVING ENVIRONMENT: Lives with husband  OCCUPATION: retired   PLOF: Independent has not had to use a walker  PATIENT GOALS   To have less pain in her knee and to get back into the gym   OBJECTIVE:   DIAGNOSTIC  FINDINGS:  Nothing recent   PATIENT SURVEYS:   COGNITION:  Overall cognitive status: Within functional limits for tasks assessed     SENSATION: WFL   POSTURE: rounded shoulders and forward head  PALPATION: Trigger points in lower IT band   LOWER EXTREMITY ROM:  Active PROM Right eval Left eval Right  2/12  Hip flexion     Hip extension     Hip abduction     Hip  adduction     Hip internal rotation     Hip external rotation     Knee flexion     Knee extension -7  -5  Ankle dorsiflexion     Ankle plantarflexion     Ankle inversion     Ankle eversion      (Blank rows = not tested)  LOWER EXTREMITY MMT:  MMT Right eval Left eval Right  2/12 Left 2/12   Hip flexion 17.7  17.8 28.5 38.8  Hip extension      Hip abduction 11.1 20.4 32.3 40.4  Hip adduction      Hip internal rotation      Hip external rotation      Knee flexion      Knee extension 18.8 17.3 38.5 51.8  Ankle dorsiflexion      Ankle plantarflexion      Ankle inversion      Ankle eversion       (Blank rows = not tested)   GAIT: Decreased single leg stance time on the right    TODAY'S TREATMENT: Manual: trigger point release to IT band; PA and AP mobilization to improve extension  There-ex:  SAQ 3x12 1.5 lns   NuStep 5 minutes reviewed set up level for    Neuro-re-ed  Cable row with abdominal breathing 15 lbs 3x12  Triceps press down life fitness 40 pounds 3 x 12 Leg press 3x12 50 lbs cybex   All perfromed with TA breathing   Step on and off air-ex x20  Heel toe on air-ex x20    02/27 Nustep lvl 6, 5 min Hip abduction machine LF 3x15 70 lbs   Neuro re-ed Cable row with TA breathing 3x10 15 lbs  Step on and off air-ex x20  Counting down backwards in 2's from 100 while walking forward, backwards and listening to directions to stop/ start, and turn.   There act  Stair negotiation with focus on alternating pattern Mimicking putting dishes away at sink while turning in different directions and listening to PT demands tasks.    2/20 Manual: trigger point release to IT band; PA and AP mobilization to improve extension  There-ex:  SAQ 3x12  SLR 3x10     NuStep 5 minutes reviewed set up level for  Hip abduction machine LF 3x15 70 lbs   Neuro re-ed Cable row with TA breathing 3x10 15 lbs  Step on and off air-ex x20  Narrow base 3x30 sesc hold  with CGA    2/12  Manual: trigger point release to IT band; PA and AP mobilization to improve extension  There-ex:  Quad set 2x15  SLR 3x10  Reviewed strength measurements    NuStep 5 minutes reviewed set up level for  Triceps press down life fitness 40 pounds 3 x 12 Cable row with abdominal breathing 15 lbs 3x12    1/29  Manual: trigger point release to IT band; PA and AP mobilization to improve      Leg press 50 lbs 3x10  Hip abdcution  3x10 55 lbs   NuStep 5 minutes reviewed set up level for  Triceps press down life fitness 30 pounds 3 x 12 Cybex row machine 30 pounds 3 x 12    1/17 Nu-step 5 min  LTR x20   Gluteal stretch 3x20 sec hold   Bridge 3x10   SLR 3x10   Cable:  Row 3x10 15 lbs  Extensions 3x12 15 lbs   Step ups:  4 inch 3x10   Leg press 50 lbs 3x10  Hip abdcution 3x10 55 lbs       1/09  Manual: Knee extension ROM; trigger point release to lateral / medial knee and IT band   LF hip abduction machine 3x10 55 lbs   Row machine 3x12 15 lbs   Triceps pushdown 3x12 20 lbs   Step down 2x10 each leg  Step up 2 inch 2x10    1/2  Manual: Knee extension ROM; trigger point release to lateral / medial knee   Quad set 2x15  Hip abduction 3x15 red  Bridge 2x10   LF row machine 2x15 20 lbs  Triceps press down 2x15 20 lbs   12/19 Manual: Knee extension ROM; trigger point release to lateral knee   LF hip abduction machine 3x10   Cybex leg press 3 x 15 40 pounds  Row machine 3x12 15 lbs   Narrow base air-ex 2x30 sec eyes closed   Tandem stance on air-ex 2x30 sec hold     12/5 Manual: Knee extension ROM; trigger point release to lateral knee   LF hip abduction machine 3x10   Cybex leg press 3 x 15 40 pounds  Quad set x 20 Straight leg raise 3 x 10 Short arc quad 3 x 10   11/22/ SAQ 3x12  Supine march 3x12  Hip abduction 3x15 red     Manual: Knee extension ROM; trigger point release to lateral knee    Eval: Manual: Knee extension ROM; trigger point release to lateral knee    PATIENT EDUCATION:  Education details: reviewed HEP; symptom management, activity progression  Person educated: Patient Education method: Explanation, Demonstration, Tactile cues, Verbal cues, and Handouts Education comprehension: verbalized understanding, returned demonstration, verbal cues required, tactile cues required, and needs further education   HOME EXERCISE PROGRAM: Access Code: [email or web address] URL: [email or web address] Date: 01/31/2023 Prepared by: Lorayne Bender  Exercises - Supine Heel Slide with Strap  - 1 x daily - 7 x weekly - 3 sets - 5 reps - 5 sec  hold - Supine Quad Set  - 1 x daily - 7 x weekly - 3 sets - 10 reps - Supine Knee Extension Stretch on Towel Roll  - 1 x daily - 7 x weekly - 3 sets - 5 reps - 5 sec  hold  ASSESSMENT:  CLINICAL IMPRESSION: The patient reported feeling mildly foggy today. She required increased ue assist with balance exercises. She otherwise did well. Her knee extension is improving significantly. She has been working on her exercises.  Eval: Patient is a 73 y.o. female who was seen today for physical therapy evaluation and treatment for right knee pain. She has had improved knee pain and mobility in the past with PT. She returns today with an acute exacerbation of her knee pain.  She continues on limited knee extension but no significant decline from her previous episode.  She has decreased balance with narrow base with eyes closed.  She is working on her pool program on her own.  She would benefit from  skilled therapy to improve balance and continue to work on knee extension. OBJECTIVE IMPAIRMENTS Abnormal gait, decreased activity tolerance, decreased endurance, decreased mobility, difficulty walking, decreased ROM, decreased strength, increased fascial restrictions, and pain.   ACTIVITY LIMITATIONS carrying, bending, standing, squatting, stairs,  transfers, and locomotion level  PARTICIPATION LIMITATIONS: cleaning, laundry, shopping, community activity, and yard work  PERSONAL FACTORS 3+ comorbidities: Psoriatic arthritis, Bi-polar , Parkinsons  are also affecting patient's functional outcome.   REHAB POTENTIAL: Good  CLINICAL DECISION MAKING: Evolving/moderate complexity  EVALUATION COMPLEXITY: Moderate   GOALS: Goals reviewed with patient? Yes  SHORT TERM GOALS: Target date: 05/02/2023     Patient will increase gross right LE strength by 5 lbs  Baseline: Goal status: INITIAL  2.  Patient will be increase knee extension passive by 3 degrees  Baseline:  Goal status: Progressing 1/10  3.  Patient will be independent with basic HEP  Baseline:  Goal status: INITIAL  LONG TERM GOALS: Target date: 03/20/2023    Patient will stand for 20 min without pain in order to perform her ADL's  Baseline:  Goal status: Achieved 1/20  2.  Patient will be independent with full gym program  Baseline:  Goal status: Progressing 1/9  3.  Patient will ambulate without increased pain community distances  Baseline:  Goal status: INITIAL    PLAN: PT FREQUENCY: 2x/week  PT DURATION: 8 weeks  PLANNED INTERVENTIONS: Therapeutic exercises, Therapeutic activity, Neuromuscular re-education, Balance training, Gait training, Patient/Family education, Joint mobilization, Stair training, DME instructions, Aquatic Therapy, Dry Needling, Electrical stimulation, Cryotherapy, Moist heat, Ultrasound, and Manual therapy  PLAN FOR NEXT SESSION: continue with manual therapy to the knee; progress to standing exercises; progress back to gym activity as able.    Dessie Coma, PT 05/21/2023, 2:13 PM

## 2023-05-22 ENCOUNTER — Encounter (HOSPITAL_BASED_OUTPATIENT_CLINIC_OR_DEPARTMENT_OTHER): Payer: Self-pay | Admitting: Physical Therapy

## 2023-05-27 ENCOUNTER — Ambulatory Visit (HOSPITAL_BASED_OUTPATIENT_CLINIC_OR_DEPARTMENT_OTHER): Payer: Medicare Other | Admitting: Physical Therapy

## 2023-05-27 DIAGNOSIS — R2689 Other abnormalities of gait and mobility: Secondary | ICD-10-CM

## 2023-05-27 DIAGNOSIS — G8929 Other chronic pain: Secondary | ICD-10-CM

## 2023-05-27 DIAGNOSIS — M25652 Stiffness of left hip, not elsewhere classified: Secondary | ICD-10-CM

## 2023-05-27 DIAGNOSIS — M6281 Muscle weakness (generalized): Secondary | ICD-10-CM

## 2023-05-28 ENCOUNTER — Encounter (HOSPITAL_BASED_OUTPATIENT_CLINIC_OR_DEPARTMENT_OTHER): Payer: Self-pay | Admitting: Physical Therapy

## 2023-05-28 NOTE — Therapy (Signed)
 OUTPATIENT PHYSICAL THERAPY LOWER EXTREMITY Progress Note    Patient Name: Tiffany [name] MRN: [id number] DOB:15-Jul-1950, 73 y.o., female Today's Date: 05/28/2023   PT End of Session - 05/28/23 1210     Visit Number 13    Number of Visits 19    Date for PT Re-Evaluation 07/02/23    Authorization Type progress note done at visit 9    PT Start Time 1345    PT Stop Time 1427    PT Time Calculation (min) 42 min    Activity Tolerance Patient tolerated treatment well    Behavior During Therapy [facility] for tasks assessed/performed              Past Medical History:  Diagnosis Date   Bipolar 1 disorder (HCC)    Chronic diarrhea    loose stools twice a day on average for years   CKD stage G4/A1, 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 1.4 cm  Grade I invasive lobular, ER and PR +/Her--, Ki 67 <5% Tried anastrozole for one month, but developed suicidal idea   Osteoarthritis, knee 09/24/2019   Xray 09/2019   Parkinsonism (HCC)    Drug induced   Polyposis of colon    Psoriatic arthritis (HCC)    PTSD (post-traumatic stress disorder)    Secondary hyperparathyroidism (HCC)    Tardive dyskinesia    Thyroid disease    Past Surgical History:  Procedure Laterality Date   ABDOMINAL HYSTERECTOMY  1987   CHOLECYSTECTOMY  1979   DILATION AND CURETTAGE OF UTERUS  1973   MASTECTOMY Bilateral 09/19/2014   TONSILLECTOMY  1970   URETERAL REIMPLANTION Bilateral 1974   Patient Active Problem List   Diagnosis Date Noted   Raynaud's phenomenon without gangrene 03/26/2023   Family history of breast cancer 06/05/2021   Osteoarthritis, knee 09/24/2019   CKD (chronic kidney disease) stage 4, GFR 15-29 ml/min (HCC) 09/24/2019   Severe recurrent major depression without psychotic features (HCC)  09/09/2019   Bipolar I disorder, most recent episode depressed (HCC)    Transaminitis    Encephalopathy 04/26/2019   Leukocytosis 04/21/2019   Thrombocytopenia (HCC) 09/04/2018   Vitamin D deficiency 09/04/2018   Psoriatic arthritis (HCC) 09/04/2018   Tubular adenoma of colon 05/28/2017   History of colonic polyps 05/28/2017   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      PCP: Jarold Motto, PA  REFERRING PROVIDER: Jarold Motto, PA  REFERRING DIAG:  [phone] - recurrent pain of right knee  THERAPY DIAG:  Other abnormalities of gait and mobility  Muscle weakness (generalized)  Chronic pain of right knee  Stiffness of left hip, not elsewhere classified  Rationale for Evaluation and Treatment Rehabilitation  ONSET DATE: 3 weeks ago  SUBJECTIVE:   SUBJECTIVE STATEMENT: The patient has had no pain. She has had a medication  adjustment which has helped her mental clarity.   PERTINENT HISTORY: Bi-polar, CKD, depression, breast cancer, OA, Parkinson's, Psoriatic Arthritis, PTSD, tardive diskenesia,   PAIN:  Are you having pain? Yes: NPRS scale: 2-3/10 pain  Pain location:IT band  Pain description: aching Aggravating factors: standing, walking , twisting Relieving factors: rest  PRECAUTIONS: None  WEIGHT BEARING RESTRICTIONS No  FALLS:  Has patient fallen in last 6 months? No  LIVING ENVIRONMENT: Lives with husband  OCCUPATION: retired   PLOF:  Independent has not had to use a walker  PATIENT GOALS   To have less pain in her knee and to get back into the gym   OBJECTIVE:   DIAGNOSTIC FINDINGS:  Nothing recent   PATIENT SURVEYS:   COGNITION:  Overall cognitive status: Within functional limits for tasks assessed     SENSATION: WFL   POSTURE: rounded shoulders and forward head  PALPATION: Trigger points in lower IT band   LOWER EXTREMITY ROM:  Active PROM Right eval Left eval Right  2/12  Hip flexion     Hip extension     Hip abduction     Hip adduction     Hip internal rotation     Hip external rotation     Knee flexion     Knee extension -7  -5  Ankle dorsiflexion     Ankle plantarflexion     Ankle inversion     Ankle eversion      (Blank rows = not tested)  LOWER EXTREMITY MMT:  MMT Right eval Left eval Right  2/12 Left 2/12   Hip flexion 17.7  17.8 28.5 38.8  Hip extension      Hip abduction 11.1 20.4 32.3 40.4  Hip adduction      Hip internal rotation      Hip external rotation      Knee flexion      Knee extension 18.8 17.3 38.5 51.8  Ankle dorsiflexion      Ankle plantarflexion      Ankle inversion      Ankle eversion       (Blank rows = not tested)   GAIT: Decreased single leg stance time on the right    TODAY'S TREATMENT: 3/18 Manual: trigger point release to IT band; PA and AP mobilization to improve extension  Neuro-re-ed  Air-ex Step on and off air-ex x20  Heel toe on air-ex x20  Heel toe rock x 20 CGA Marching 2x10   There-act: Hurdle walk lateral 10'x4  Fwd hurdle walk 10'x4   Backwards hurdle walk 10'x4   Step up 4 inch 2x10 each leg  Lateral step up 2x10 each leg   3/12  Manual: trigger point release to IT band; PA and AP mobilization to improve extension  There-ex:  SAQ 3x12 1.5 lns   NuStep 5 minutes reviewed set up level for    Neuro-re-ed  Cable row with abdominal breathing 15 lbs 3x12  Triceps press down life fitness 40 pounds 3 x  12 Leg press 3x12 50 lbs cybex   All perfromed with TA breathing   Step on and off air-ex x20  Heel toe on air-ex x20    02/27 Nustep lvl 6, 5 min Hip abduction machine LF 3x15 70 lbs   Neuro re-ed Cable row with TA breathing 3x10 15 lbs  Step on and off air-ex x20  Counting down backwards in 2's from 100 while walking forward, backwards and listening to directions to stop/ start, and turn.  There act  Stair negotiation with focus on alternating pattern Mimicking putting dishes away at sink while turning in different directions and listening to PT demands tasks.    2/20 Manual: trigger point release to IT band; PA and AP mobilization to improve extension  There-ex:  SAQ 3x12  SLR 3x10     NuStep 5 minutes reviewed set up level for  Hip abduction machine LF 3x15 70 lbs   Neuro re-ed Cable row with TA breathing 3x10 15 lbs  Step on and off air-ex x20  Narrow base 3x30 sesc hold with CGA    2/12  Manual: trigger point release to IT band; PA and AP mobilization to improve extension  There-ex:  Quad set 2x15  SLR 3x10  Reviewed strength measurements    NuStep 5 minutes reviewed set up level for  Triceps press down life fitness 40 pounds 3 x 12 Cable row with abdominal breathing 15 lbs 3x12    1/29  Manual: trigger point release to IT band; PA and AP mobilization to improve      Leg press 50 lbs 3x10  Hip abdcution 3x10 55 lbs   NuStep 5 minutes reviewed set up level for  Triceps press down life fitness 30 pounds 3 x 12 Cybex row machine 30 pounds 3 x 12    1/17 Nu-step 5 min  LTR x20   Gluteal stretch 3x20 sec hold   Bridge 3x10   SLR 3x10   Cable:  Row 3x10 15 lbs  Extensions 3x12 15 lbs   Step ups:  4 inch 3x10   Leg press 50 lbs 3x10  Hip abdcution 3x10 55 lbs       1/09  Manual: Knee extension ROM; trigger point release to lateral / medial knee and IT band   LF hip abduction machine 3x10 55 lbs   Row machine 3x12  15 lbs   Triceps pushdown 3x12 20 lbs   Step down 2x10 each leg  Step up 2 inch 2x10    1/2  Manual: Knee extension ROM; trigger point release to lateral / medial knee   Quad set 2x15  Hip abduction 3x15 red  Bridge 2x10   LF row machine 2x15 20 lbs  Triceps press down 2x15 20 lbs   12/19 Manual: Knee extension ROM; trigger point release to lateral knee   LF hip abduction machine 3x10   Cybex leg press 3 x 15 40 pounds  Row machine 3x12 15 lbs   Narrow base air-ex 2x30 sec eyes closed   Tandem stance on air-ex 2x30 sec hold     12/5 Manual: Knee extension ROM; trigger point release to lateral knee   LF hip abduction machine 3x10   Cybex leg press 3 x 15 40 pounds  Quad set x 20 Straight leg raise 3 x 10 Short arc quad 3 x 10   11/22/ SAQ 3x12  Supine march 3x12  Hip abduction 3x15 red     Manual: Knee extension ROM; trigger point release to lateral knee   Eval: Manual: Knee extension ROM; trigger point release to lateral knee    PATIENT EDUCATION:  Education details: reviewed HEP; symptom management, activity progression  Person educated: Patient Education method: Explanation, Demonstration, Tactile cues, Verbal cues, and Handouts Education comprehension: verbalized understanding, returned demonstration, verbal cues required, tactile cues required, and needs further education   HOME EXERCISE PROGRAM: Access Code: [email or web address] URL: [email or web address] Date: 01/31/2023 Prepared by: Lorayne Bender  Exercises - Supine Heel Slide with  Strap  - 1 x daily - 7 x weekly - 3 sets - 5 reps - 5 sec  hold - Supine Quad Set  - 1 x daily - 7 x weekly - 3 sets - 10 reps - Supine Knee Extension Stretch on Towel Roll  - 1 x daily - 7 x weekly - 3 sets - 5 reps - 5 sec  hold  ASSESSMENT:  CLINICAL IMPRESSION: The patient continues to tolerate treatment well. We focused on balance and gait training.  We emphasized gait exercises with focus on hip  flexion and clearing her toe. We also worked on [employer] on a Human resources officer. Next visit we will work on getting back into the gym.  We also continue to work on knee extension  Patient is a 73 y.o. female who was seen today for physical therapy evaluation and treatment for right knee pain. She has had improved knee pain and mobility in the past with PT. She returns today with an acute exacerbation of her knee pain.  She continues on limited knee extension but no significant decline from her previous episode.  She has decreased balance with narrow base with eyes closed.  She is working on her pool program on her own.  She would benefit from skilled therapy to improve balance and continue to work on knee extension. OBJECTIVE IMPAIRMENTS Abnormal gait, decreased activity tolerance, decreased endurance, decreased mobility, difficulty walking, decreased ROM, decreased strength, increased fascial restrictions, and pain.   ACTIVITY LIMITATIONS carrying, bending, standing, squatting, stairs, transfers, and locomotion level  PARTICIPATION LIMITATIONS: cleaning, laundry, shopping, community activity, and yard work  PERSONAL FACTORS 3+ comorbidities: Psoriatic arthritis, Bi-polar , Parkinsons  are also affecting patient's functional outcome.   REHAB POTENTIAL: Good  CLINICAL DECISION MAKING: Evolving/moderate complexity  EVALUATION COMPLEXITY: Moderate   GOALS: Goals reviewed with patient? Yes  SHORT TERM GOALS: Target date: 05/02/2023     Patient will increase gross right LE strength by 5 lbs  Baseline: Goal status: INITIAL  2.  Patient will be increase knee extension passive by 3 degrees  Baseline:  Goal status: Progressing 1/10  3.  Patient will be independent with basic HEP  Baseline:  Goal status: INITIAL  LONG TERM GOALS: Target date: 03/20/2023    Patient will stand for 20 min without pain in order to perform her ADL's  Baseline:  Goal status: Achieved 1/20  2.  Patient will be  independent with full gym program  Baseline:  Goal status: Progressing 1/9  3.  Patient will ambulate without increased pain community distances  Baseline:  Goal status: INITIAL    PLAN: PT FREQUENCY: 2x/week  PT DURATION: 8 weeks  PLANNED INTERVENTIONS: Therapeutic exercises, Therapeutic activity, Neuromuscular re-education, Balance training, Gait training, Patient/Family education, Joint mobilization, Stair training, DME instructions, Aquatic Therapy, Dry Needling, Electrical stimulation, Cryotherapy, Moist heat, Ultrasound, and Manual therapy  PLAN FOR NEXT SESSION: continue with manual therapy to the knee; progress to standing exercises; progress back to gym activity as able.    Dessie Coma, PT 05/28/2023, 12:12 PM

## 2023-06-03 ENCOUNTER — Encounter (HOSPITAL_BASED_OUTPATIENT_CLINIC_OR_DEPARTMENT_OTHER): Payer: Self-pay | Admitting: Physical Therapy

## 2023-06-03 ENCOUNTER — Ambulatory Visit (HOSPITAL_BASED_OUTPATIENT_CLINIC_OR_DEPARTMENT_OTHER): Payer: Medicare Other | Admitting: Physical Therapy

## 2023-06-03 DIAGNOSIS — M25652 Stiffness of left hip, not elsewhere classified: Secondary | ICD-10-CM

## 2023-06-03 DIAGNOSIS — G8929 Other chronic pain: Secondary | ICD-10-CM

## 2023-06-03 DIAGNOSIS — R2681 Unsteadiness on feet: Secondary | ICD-10-CM

## 2023-06-03 DIAGNOSIS — M25552 Pain in left hip: Secondary | ICD-10-CM

## 2023-06-03 DIAGNOSIS — R2689 Other abnormalities of gait and mobility: Secondary | ICD-10-CM | POA: Diagnosis not present

## 2023-06-03 DIAGNOSIS — M6281 Muscle weakness (generalized): Secondary | ICD-10-CM

## 2023-06-03 NOTE — Therapy (Signed)
 OUTPATIENT PHYSICAL THERAPY LOWER EXTREMITY Progress Note    Patient Name: Tiffany [name] MRN: [id number] DOB:February 07, 1951, 73 y.o., female Today's Date: 06/04/2023   PT End of Session - 06/03/23 1435     Visit Number 14    Number of Visits 19    Date for PT Re-Evaluation 07/02/23    PT Start Time 1432    PT Stop Time 1513    PT Time Calculation (min) 41 min    Activity Tolerance Patient tolerated treatment well;No increased pain    Behavior During Therapy WFL for tasks assessed/performed               Past Medical History:  Diagnosis Date   Bipolar 1 disorder (HCC)    Chronic diarrhea    loose stools twice a day on average for years   CKD stage G4/A1, 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 1.4 cm  Grade I invasive lobular, ER and PR +/Her--, Ki 67 <5% Tried anastrozole for one month, but developed suicidal idea   Osteoarthritis, knee 09/24/2019   Xray 09/2019   Parkinsonism (HCC)    Drug induced   Polyposis of colon    Psoriatic arthritis (HCC)    PTSD (post-traumatic stress disorder)    Secondary hyperparathyroidism (HCC)    Tardive dyskinesia    Thyroid disease    Past Surgical History:  Procedure Laterality Date   ABDOMINAL HYSTERECTOMY  1987   CHOLECYSTECTOMY  1979   DILATION AND CURETTAGE OF UTERUS  1973   MASTECTOMY Bilateral 09/19/2014   TONSILLECTOMY  1970   URETERAL REIMPLANTION Bilateral 1974   Patient Active Problem List   Diagnosis Date Noted   Raynaud's phenomenon without gangrene 03/26/2023   Family history of breast cancer 06/05/2021   Osteoarthritis, knee 09/24/2019   CKD (chronic kidney disease) stage 4, GFR 15-29 ml/min (HCC) 09/24/2019   Severe recurrent major depression without psychotic features (HCC) 09/09/2019   Bipolar I disorder,  most recent episode depressed (HCC)    Transaminitis    Encephalopathy 04/26/2019   Leukocytosis 04/21/2019   Thrombocytopenia (HCC) 09/04/2018   Vitamin D deficiency 09/04/2018   Psoriatic arthritis (HCC) 09/04/2018   Tubular adenoma of colon 05/28/2017   History of colonic polyps 05/28/2017   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      PCP: Jarold Motto, PA  REFERRING PROVIDER: Jarold Motto, PA  REFERRING DIAG:  [id number] - recurrent pain of right knee  THERAPY DIAG:  Other abnormalities of gait and mobility  Muscle weakness (generalized)  Chronic pain of right knee  Stiffness of left hip, not elsewhere classified  Pain in left hip  Unsteadiness on feet  Rationale for Evaluation and Treatment Rehabilitation  ONSET DATE: 3 weeks ago  SUBJECTIVE:   SUBJECTIVE STATEMENT: The patient reported increased pain in left hip and knee  when waking up this morning. Said the pain got better once she was up and moving around. Pain was 6/10 when she first woke and is now 3/10.   PERTINENT HISTORY: Bi-polar, CKD, depression, breast cancer, OA, Parkinson's, Psoriatic Arthritis, PTSD, tardive diskenesia,   PAIN:  Are you having pain? Yes: NPRS scale: 3.5/10 pain  Pain location:IT band  Pain description: aching Aggravating factors: standing, walking , twisting Relieving factors: rest  PRECAUTIONS: None  WEIGHT BEARING RESTRICTIONS No  FALLS:  Has patient  fallen in last 6 months? No  LIVING ENVIRONMENT: Lives with husband  OCCUPATION: retired   PLOF: Independent has not had to use a walker  PATIENT GOALS   To have less pain in her knee and to get back into the gym   OBJECTIVE:   DIAGNOSTIC FINDINGS:  Nothing recent   PATIENT SURVEYS:   COGNITION:  Overall cognitive status: Within functional limits for tasks assessed     SENSATION: WFL   POSTURE: rounded shoulders and forward head  PALPATION: Trigger points in lower IT band   LOWER EXTREMITY ROM:  Active PROM Right eval Left eval Right  2/12  Hip flexion     Hip extension     Hip abduction     Hip adduction     Hip internal rotation     Hip external rotation     Knee flexion     Knee extension -7  -5  Ankle dorsiflexion     Ankle plantarflexion     Ankle inversion     Ankle eversion      (Blank rows = not tested)  LOWER EXTREMITY MMT:  MMT Right eval Left eval Right  2/12 Left 2/12   Hip flexion 17.7  17.8 28.5 38.8  Hip extension      Hip abduction 11.1 20.4 32.3 40.4  Hip adduction      Hip internal rotation      Hip external rotation      Knee flexion      Knee extension 18.8 17.3 38.5 51.8  Ankle dorsiflexion      Ankle plantarflexion      Ankle inversion      Ankle eversion       (Blank rows = not tested)   GAIT: Decreased single leg stance time on the right    TODAY'S TREATMENT: 3/25: Manual:  STM and roller used to IT band and glute muscles on L side There-ex: LTR 2x10 (felt good) Bridges 2x12 Clamshells red RTB 3x10 Gluteal stretch 3x20 sec hold     Neuro-Re-ed  Bridges 2x12 with abdominal squeeze Shoulder extension with core breathing cable 10lbs 2x10  Row with TA breathing 2x10 15 lbs   3/18 Manual: trigger point release to IT band; PA and AP mobilization to improve extension  Neuro-re-ed  Air-ex Step on and off air-ex x20  Heel toe on air-ex x20  Heel toe rock x 20 CGA Marching 2x10    There-act: Hurdle walk lateral 10'x4  Fwd hurdle walk 10'x4   Backwards hurdle walk 10'x4   Step up 4 inch 2x10 each leg  Lateral step up 2x10 each leg   3/12  Manual: trigger point release to IT band; PA and AP mobilization to improve extension  There-ex:  SAQ 3x12 1.5 lns   NuStep 5 minutes reviewed set up level for    Neuro-re-ed  Cable row with abdominal breathing 15 lbs 3x12  Triceps press down life fitness 40 pounds 3 x 12  Leg press 3x12 50 lbs cybex   All perfromed with TA breathing   Step on and off air-ex x20  Heel toe on air-ex x20    02/27 Nustep lvl 6, 5 min Hip abduction machine LF 3x15 70 lbs   Neuro re-ed Cable row with TA breathing 3x10 15 lbs  Step on and off air-ex x20  Counting down backwards in 2's from 100 while walking forward, backwards and listening to directions to stop/ start, and turn.   There act  Stair negotiation with focus on alternating pattern Mimicking putting dishes away at sink while turning in different directions and listening to PT demands tasks.    2/20 Manual: trigger point release to IT band; PA and AP mobilization to improve extension  There-ex:  SAQ 3x12  SLR 3x10     NuStep 5 minutes reviewed set up level for  Hip abduction machine LF 3x15 70 lbs   Neuro re-ed Cable row with TA breathing 3x10 15 lbs  Step on and off air-ex x20  Narrow base 3x30 sesc hold with CGA    2/12  Manual: trigger point release to IT band; PA and AP mobilization to improve extension  There-ex:  Quad set 2x15  SLR 3x10  Reviewed strength measurements    NuStep 5 minutes reviewed set up level for  Triceps press down life fitness 40 pounds 3 x 12 Cable row with abdominal breathing 15 lbs 3x12    1/29  Manual: trigger point release to IT band; PA and AP mobilization to improve      Leg press 50 lbs 3x10  Hip abdcution 3x10 55 lbs   NuStep 5 minutes reviewed set up level for  Triceps press down life fitness 30  pounds 3 x 12 Cybex row machine 30 pounds 3 x 12    1/17 Nu-step 5 min  LTR x20   Gluteal stretch 3x20 sec hold   Bridge 3x10   SLR 3x10   Cable:  Row 3x10 15 lbs  Extensions 3x12 15 lbs   Step ups:  4 inch 3x10   Leg press 50 lbs 3x10  Hip abdcution 3x10 55 lbs       1/09  Manual: Knee extension ROM; trigger point release to lateral / medial knee and IT band   LF hip abduction machine 3x10 55 lbs   Row machine 3x12 15 lbs   Triceps pushdown 3x12 20 lbs   Step down 2x10 each leg  Step up 2 inch 2x10    1/2  Manual: Knee extension ROM; trigger point release to lateral / medial knee   Quad set 2x15  Hip abduction 3x15 red  Bridge 2x10   LF row machine 2x15 20 lbs  Triceps press down 2x15 20 lbs     PATIENT EDUCATION:  Education details: reviewed HEP; symptom management, activity progression  Person educated: Patient Education method: Explanation, Demonstration, Tactile cues, Verbal cues, and Handouts Education comprehension: verbalized understanding, returned demonstration, verbal cues required, tactile cues required, and needs further education   HOME EXERCISE PROGRAM: Access Code: [email or web address] URL: [email or web address] Date: 01/31/2023 Prepared by: Lorayne Bender  Exercises - Supine Heel Slide with Strap  - 1 x daily - 7 x weekly - 3 sets - 5 reps - 5 sec  hold - Supine Quad Set  - 1 x daily - 7 x weekly - 3 sets - 10 reps - Supine Knee Extension Stretch on Towel Roll  - 1 x daily - 7 x weekly -  3 sets - 5 reps - 5 sec  hold  ASSESSMENT:  CLINICAL IMPRESSION: 3/25: Pt warmed up on the nustep lvl 6. She was having left hip and IT band pain today. We reviewed self soft tissue mobilization. She had large spasms in her lateral gluteal and IT band. She flet better after her exercises. We reviewed stretches she can perform for these areas. We are treating her right knee but these areas are likely a result of the right knee and will  cause increased lateral shift to her right knee. We reviewed base exercises that she can perform at home. We updated her HEP. We reviewed light core exercises she can perform at the gym that don't effect thi hip.    Eval:  Patient is a 73 y.o. female who was seen today for physical therapy evaluation and treatment for right knee pain. She has had improved knee pain and mobility in the past with PT. She returns today with an acute exacerbation of her knee pain.  She continues on limited knee extension but no significant decline from her previous episode.  She has decreased balance with narrow base with eyes closed.  She is working on her pool program on her own.  She would benefit from skilled therapy to improve balance and continue to work on knee extension. OBJECTIVE IMPAIRMENTS Abnormal gait, decreased activity tolerance, decreased endurance, decreased mobility, difficulty walking, decreased ROM, decreased strength, increased fascial restrictions, and pain.   ACTIVITY LIMITATIONS carrying, bending, standing, squatting, stairs, transfers, and locomotion level  PARTICIPATION LIMITATIONS: cleaning, laundry, shopping, community activity, and yard work  PERSONAL FACTORS 3+ comorbidities: Psoriatic arthritis, Bi-polar , Parkinsons  are also affecting patient's functional outcome.   REHAB POTENTIAL: Good  CLINICAL DECISION MAKING: Evolving/moderate complexity  EVALUATION COMPLEXITY: Moderate   GOALS: Goals reviewed with patient? Yes  SHORT TERM GOALS: Target date: 05/02/2023     Patient will increase gross right LE strength by 5 lbs  Baseline: Goal status: INITIAL  2.  Patient will be increase knee extension passive by 3 degrees  Baseline:  Goal status: Progressing 1/10  3.  Patient will be independent with basic HEP  Baseline:  Goal status: INITIAL  LONG TERM GOALS: Target date: 03/20/2023    Patient will stand for 20 min without pain in order to perform her ADL's  Baseline:   Goal status: Achieved 1/20  2.  Patient will be independent with full gym program  Baseline:  Goal status: Progressing 1/9  3.  Patient will ambulate without increased pain community distances  Baseline:  Goal status: INITIAL    PLAN: PT FREQUENCY: 2x/week  PT DURATION: 8 weeks  PLANNED INTERVENTIONS: Therapeutic exercises, Therapeutic activity, Neuromuscular re-education, Balance training, Gait training, Patient/Family education, Joint mobilization, Stair training, DME instructions, Aquatic Therapy, Dry Needling, Electrical stimulation, Cryotherapy, Moist heat, Ultrasound, and Manual therapy  PLAN FOR NEXT SESSION: continue with manual therapy to the knee; progress to standing exercises; progress back to gym activity as able.    Dessie Coma, PT 06/04/2023, 7:44 AM

## 2023-06-04 ENCOUNTER — Encounter (HOSPITAL_BASED_OUTPATIENT_CLINIC_OR_DEPARTMENT_OTHER): Payer: Self-pay | Admitting: Physical Therapy

## 2023-06-10 ENCOUNTER — Encounter (HOSPITAL_BASED_OUTPATIENT_CLINIC_OR_DEPARTMENT_OTHER): Admitting: Physical Therapy

## 2023-06-11 ENCOUNTER — Encounter: Payer: Self-pay | Admitting: Physician Assistant

## 2023-06-11 ENCOUNTER — Ambulatory Visit (INDEPENDENT_AMBULATORY_CARE_PROVIDER_SITE_OTHER): Admitting: Physician Assistant

## 2023-06-11 ENCOUNTER — Ambulatory Visit (HOSPITAL_BASED_OUTPATIENT_CLINIC_OR_DEPARTMENT_OTHER): Attending: Physician Assistant | Admitting: Physical Therapy

## 2023-06-11 ENCOUNTER — Encounter (HOSPITAL_BASED_OUTPATIENT_CLINIC_OR_DEPARTMENT_OTHER): Payer: Self-pay

## 2023-06-11 VITALS — BP 130/70 | HR 66 | Temp 98.4°F | Ht 64.0 in | Wt 181.4 lb

## 2023-06-11 DIAGNOSIS — J029 Acute pharyngitis, unspecified: Secondary | ICD-10-CM | POA: Diagnosis not present

## 2023-06-11 LAB — POCT RAPID STREP A (OFFICE): Rapid Strep A Screen: NEGATIVE

## 2023-06-11 LAB — POC COVID19 BINAXNOW: SARS Coronavirus 2 Ag: NEGATIVE

## 2023-06-11 NOTE — Progress Notes (Signed)
 Tiffany [name] is a 73 y.o. female here for a new problem.  History of Present Illness:   Chief Complaint  Patient presents with   Sore Throat    Pt c/o sore throat started yesterday, now right ear pain, headache, eyes are sensitive to light.    Sore Throat  Patient complains of a sharp sore throat that started yesterday.  Associated symptoms include right ear pain and crackling, headaches, and light sensitivity. Sore throat is not as intense as yesterday.   Denies experiencing any seasonal allergies or using allergy medications or nasal spray.   She reports possible exposure to viral sickness from her grandchildren.  Covid and Strep throat tests are negative today.    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 1.4 cm  Grade I invasive lobular, ER and PR +/Her--, Ki 67 <5% Tried anastrozole for one month, but developed suicidal idea   Osteoarthritis, knee 09/24/2019   Xray 09/2019   Parkinsonism (HCC)    Drug induced   Polyposis of colon    Psoriatic arthritis (HCC)    PTSD (post-traumatic stress disorder)    Secondary hyperparathyroidism (HCC)    Tardive dyskinesia    Thyroid disease      Social History   Tobacco Use   Smoking status: Never   Smokeless tobacco: Never  Vaping Use   Vaping status: Never Used  Substance Use Topics   Alcohol use: Never   Drug use: Never    Past Surgical History:  Procedure Laterality Date   ABDOMINAL HYSTERECTOMY  1987   CHOLECYSTECTOMY  1979   DILATION AND CURETTAGE OF UTERUS  1973   MASTECTOMY Bilateral 09/19/2014   TONSILLECTOMY  1970   URETERAL REIMPLANTION Bilateral 1974     Family History  Problem Relation Age of Onset   Lymphoma Mother 85   Lymphoma Sister 75   Breast cancer Sister 1   Colon polyps Sister    Lung cancer Sister        former smoker   Stroke Maternal Grandfather    Diabetes Paternal Grandfather    Colon cancer Neg Hx    Esophageal cancer Neg Hx    Stomach cancer Neg Hx    Rectal cancer Neg Hx     Allergies  Allergen Reactions   Aripiprazole Other (See Comments)    Parkinsonism      Lactose Intolerance (Gi) Diarrhea   Methotrexate Other (See Comments)    Hair loss, severe stomatitis     Nsaids     CKD 4   Cefdinir Diarrhea    Other reaction(s): Diarrhea Yeast infection and fever; negative c diff   Etanercept Other (See Comments)    Headaches     Exemestane Other (See Comments)    Suicidal thoughts with medication     Fluoxetine Other (See Comments)    Parkinsonism   Methylprednisolone Sodium Succ Other (See Comments)    Agitated mania   Other Other (See Comments)    Paper tape causes bruising.   Epinephrine Palpitations    tachycardia    Nitrofurantoin Other (See Comments)  GI upset from 2012    Current Medications:   Current Outpatient Medications:    amLODipine (NORVASC) 5 MG tablet, TAKE 1 TABLET(5 MG) BY MOUTH DAILY, Disp: 90 tablet, Rfl: 1   atorvastatin (LIPITOR) 10 MG tablet, TAKE 1 TABLET(10 MG) BY MOUTH DAILY, Disp: 90 tablet, Rfl: 1   augmented betamethasone dipropionate (DIPROLENE-AF) 0.05 % cream, APPLY TO THE AFFECTED AREAS OF LEG TWICE DAILY AS NEEDED FOR RASH. NOT TO FACE, GROIN, UNDERARMS, Disp: 50 g, Rfl: 0   cariprazine (VRAYLAR) 1.5 MG capsule, every other day., Disp: , Rfl:    Cholecalciferol 50 MCG (2000 UT) TABS, 1 tablet Orally Once a day for 30 day(s), Disp: , Rfl:    Golimumab (SIMPONI ARIA IV), Infusion every 8 weeks, Disp: , Rfl:    ipratropium (ATROVENT) 0.03 % nasal spray, Place 2 sprays into both nostrils every 12 (twelve) hours., Disp: 30 mL, Rfl: 12   levothyroxine  (SYNTHROID) 50 MCG tablet, Take 1 tablet (50 mcg total) by mouth as directed. Take 2 tabs on Sundays and Wednesday,  1 tab rest of the week, Disp: 117 tablet, Rfl: 2   LORazepam (ATIVAN) 0.5 MG tablet, Take 0.5 mg by mouth 3 (three) times daily as needed., Disp: , Rfl:    metoprolol succinate (TOPROL XL) 25 MG 24 hr tablet, Take 0.5 tablets (12.5 mg total) by mouth daily., Disp: 45 tablet, Rfl: 3   metroNIDAZOLE (METROGEL) 0.75 % gel, Apply to face 1-2 times daily, Disp: 45 g, Rfl: 1   mirtazapine (REMERON) 30 MG tablet, Take 30 mg by mouth at bedtime., Disp: , Rfl:    Omega-3 Fatty Acids (FISH OIL) 1200 MG CAPS, Take 1 capsule by mouth in the morning and at bedtime., Disp: , Rfl:    Probiotic Product (ALIGN) 4 MG CAPS, Take 1 capsule by mouth daily in the afternoon., Disp: , Rfl:    Review of Systems:   Review of Systems  HENT:  Positive for ear pain and sore throat.   Neurological:  Positive for headaches.  Negative unless otherwise specified per HPI.   Vitals:   Vitals:   06/11/23 1049  BP: 130/70  Pulse: 66  Temp: 98.4 F (36.9 C)  TempSrc: Temporal  SpO2: 95%  Weight: 181 lb 6.1 oz (82.3 kg)  Height: 5\' 4"  (1.626 m)     Body mass index is 31.13 kg/m.  Physical Exam:   Physical Exam Vitals and nursing note reviewed.  Constitutional:      General: She is not in acute distress.    Appearance: She is well-developed. She is not ill-appearing or toxic-appearing.  HENT:     Head: Normocephalic and atraumatic.     Right Ear: Ear canal and external ear normal. A middle ear effusion is present. Tympanic membrane is not erythematous, retracted or bulging.     Left Ear: Ear canal and external ear normal. A middle ear effusion is present. Tympanic membrane is not erythematous, retracted or bulging.     Nose: Nose normal.     Right Sinus: No maxillary sinus tenderness or frontal sinus tenderness.     Left Sinus: No maxillary sinus tenderness or frontal sinus tenderness.      Mouth/Throat:     Pharynx: Uvula midline. No posterior oropharyngeal erythema.  Eyes:     General: Lids are normal.     Conjunctiva/sclera: Conjunctivae normal.  Neck:     Trachea: Trachea normal.  Cardiovascular:     Rate and Rhythm: Normal rate and  regular rhythm.     Pulses: Normal pulses.     Heart sounds: Normal heart sounds, S1 normal and S2 normal.  Pulmonary:     Effort: Pulmonary effort is normal.     Breath sounds: Normal breath sounds. No decreased breath sounds, wheezing, rhonchi or rales.  Lymphadenopathy:     Cervical: No cervical adenopathy.  Skin:    General: Skin is warm and dry.  Neurological:     Mental Status: She is alert.     GCS: GCS eye subscore is 4. GCS verbal subscore is 5. GCS motor subscore is 6.  Psychiatric:        Speech: Speech normal.        Behavior: Behavior normal. Behavior is cooperative.    Results for orders placed or performed in visit on 06/11/23  POCT rapid strep A  Result Value Ref Range   Rapid Strep A Screen Negative Negative  POC COVID-19  Result Value Ref Range   SARS Coronavirus 2 Ag Negative Negative     Assessment and Plan:   Sore throat No red flags on discussion, patient is not in any obvious distress during our visit. Discussed progression of most viral illness, and recommended supportive care at this point in time. I recommended resuming her atrovent nasal pray Discussed over the counter supportive care options, with recommendations to push fluids and rest. Reviewed return precautions including new/worsening fever, SOB, new/worsening cough or other concerns.  I recommend that patient follow-up if symptoms worsen or persist despite treatment x 7-10 days, sooner if needed.   Jarold Motto, PA-C  I,Safa M Kadhim,acting as a scribe for Jarold Motto, PA.,have documented all relevant documentation on the behalf of Jarold Motto, PA,as directed by  Jarold Motto, PA while in the presence of Jarold Motto,  [place].   I, Jarold Motto, [place], have reviewed all documentation for this visit. The documentation on 06/11/23 for the exam, diagnosis, procedures, and orders are all accurate and complete.

## 2023-06-13 ENCOUNTER — Other Ambulatory Visit: Payer: Self-pay | Admitting: Physician Assistant

## 2023-06-16 ENCOUNTER — Encounter (HOSPITAL_BASED_OUTPATIENT_CLINIC_OR_DEPARTMENT_OTHER): Payer: Self-pay | Admitting: Physical Therapy

## 2023-06-16 ENCOUNTER — Ambulatory Visit (HOSPITAL_BASED_OUTPATIENT_CLINIC_OR_DEPARTMENT_OTHER): Attending: Physician Assistant | Admitting: Physical Therapy

## 2023-06-16 DIAGNOSIS — G8929 Other chronic pain: Secondary | ICD-10-CM | POA: Diagnosis present

## 2023-06-16 DIAGNOSIS — R2689 Other abnormalities of gait and mobility: Secondary | ICD-10-CM | POA: Diagnosis present

## 2023-06-16 DIAGNOSIS — M25652 Stiffness of left hip, not elsewhere classified: Secondary | ICD-10-CM | POA: Diagnosis present

## 2023-06-16 DIAGNOSIS — M6281 Muscle weakness (generalized): Secondary | ICD-10-CM | POA: Insufficient documentation

## 2023-06-16 DIAGNOSIS — R2681 Unsteadiness on feet: Secondary | ICD-10-CM | POA: Diagnosis present

## 2023-06-16 DIAGNOSIS — M25561 Pain in right knee: Secondary | ICD-10-CM | POA: Diagnosis present

## 2023-06-16 DIAGNOSIS — M25552 Pain in left hip: Secondary | ICD-10-CM | POA: Insufficient documentation

## 2023-06-16 NOTE — Therapy (Signed)
 OUTPATIENT PHYSICAL THERAPY LOWER EXTREMITY Progress Note    Patient Name: Tiffany [name] MRN: [id number] DOB:1950/06/06, 73 y.o., female Today's Date: 06/16/2023   PT End of Session - 06/16/23 1157     Visit Number 15    Number of Visits 19    Date for PT Re-Evaluation 07/02/23    PT Start Time 1015    PT Stop Time 1057    PT Time Calculation (min) 42 min    Activity Tolerance Patient tolerated treatment well;No increased pain    Behavior During Therapy WFL for tasks assessed/performed                Past Medical History:  Diagnosis Date   Bipolar 1 disorder (HCC)    Chronic diarrhea    loose stools twice a day on average for years   CKD stage G4/A1, 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 1.4 cm  Grade I invasive lobular, ER and PR +/Her--, Ki 67 <5% Tried anastrozole for one month, but developed suicidal idea   Osteoarthritis, knee 09/24/2019   Xray 09/2019   Parkinsonism (HCC)    Drug induced   Polyposis of colon    Psoriatic arthritis (HCC)    PTSD (post-traumatic stress disorder)    Secondary hyperparathyroidism (HCC)    Tardive dyskinesia    Thyroid disease    Past Surgical History:  Procedure Laterality Date   ABDOMINAL HYSTERECTOMY  1987   CHOLECYSTECTOMY  1979   DILATION AND CURETTAGE OF UTERUS  1973   MASTECTOMY Bilateral 09/19/2014   TONSILLECTOMY  1970   URETERAL REIMPLANTION Bilateral 1974   Patient Active Problem List   Diagnosis Date Noted   Raynaud's phenomenon without gangrene 03/26/2023   Family history of breast cancer 06/05/2021   Osteoarthritis, knee 09/24/2019   CKD (chronic kidney disease) stage 4, GFR 15-29 ml/min (HCC) 09/24/2019   Severe recurrent major depression without psychotic features (HCC) 09/09/2019   Bipolar I disorder,  most recent episode depressed (HCC)    Transaminitis    Encephalopathy 04/26/2019   Leukocytosis 04/21/2019   Thrombocytopenia (HCC) 09/04/2018   Vitamin D deficiency 09/04/2018   Psoriatic arthritis (HCC) 09/04/2018   Tubular adenoma of colon 05/28/2017   History of colonic polyps 05/28/2017   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      PCP: Jarold Motto, PA  REFERRING PROVIDER: Jarold Motto, PA  REFERRING DIAG:  [phone] - recurrent pain of right knee  THERAPY DIAG:  Other abnormalities of gait and mobility  Muscle weakness (generalized)  Chronic pain of right knee  Stiffness of left hip, not elsewhere classified  Pain in left hip  Unsteadiness on feet  Rationale for Evaluation and Treatment Rehabilitation  ONSET DATE: 3 weeks ago  SUBJECTIVE:   SUBJECTIVE STATEMENT: 4/7 Pt says she is feeling a lot better.  Says the exercises she is doing are helping a lot along with the stretches.   PERTINENT HISTORY: Bi-polar, CKD, depression, breast cancer, OA, Parkinson's, Psoriatic Arthritis, PTSD, tardive diskenesia,   PAIN:  Are you having pain? Yes: NPRS scale: 3.5/10 pain  Pain location:IT band  Pain description: aching Aggravating factors: standing, walking , twisting Relieving factors: rest  PRECAUTIONS: None  WEIGHT BEARING RESTRICTIONS No  FALLS:  Has patient fallen in last 6 months? No  LIVING ENVIRONMENT: Lives with husband   OCCUPATION: retired   PLOF: Independent has not had to use a walker  PATIENT GOALS   To have less pain in her knee and to get back into the gym   OBJECTIVE:   DIAGNOSTIC FINDINGS:  Nothing recent   PATIENT SURVEYS:   COGNITION:  Overall cognitive status: Within functional limits for tasks assessed     SENSATION: WFL   POSTURE: rounded shoulders and forward head  PALPATION: Trigger points in lower IT band   LOWER EXTREMITY ROM:  Active PROM Right eval Left eval Right  2/12  Hip flexion     Hip extension     Hip abduction     Hip adduction     Hip internal rotation     Hip external rotation     Knee flexion     Knee extension -7  -5  Ankle dorsiflexion     Ankle plantarflexion     Ankle inversion     Ankle eversion      (Blank rows = not tested)  LOWER EXTREMITY MMT:  MMT Right eval Left eval Right  2/12 Left 2/12   Hip flexion 17.7  17.8 28.5 38.8  Hip extension      Hip abduction 11.1 20.4 32.3 40.4  Hip adduction      Hip internal rotation      Hip external rotation      Knee flexion      Knee extension 18.8 17.3 38.5 51.8  Ankle dorsiflexion      Ankle plantarflexion      Ankle inversion      Ankle eversion       (Blank rows = not tested)   GAIT: Decreased single leg stance time on the right    TODAY'S TREATMENT: 4/7 There-ex: LTR 3x10 each side (pt really enjoys these) Glute stretch 3x30sec each side HEP review   Neuro-Re-ed Air ex step on step off 2x20 Air air L and R upper body rotn for balance with movement 2x5 on air ex, 1x5 on flat surface Step ups 6in 2x10 Step downs 6in 2x10   3/25: Manual:  STM and roller used to IT band and glute muscles on L side There-ex: LTR 2x10 (felt good) Bridges 2x12 Clamshells red RTB 3x10 Gluteal stretch 3x20 sec hold     Neuro-Re-ed  Bridges 2x12 with abdominal squeeze Shoulder extension with core breathing cable 10lbs 2x10  Row with TA breathing 2x10  15 lbs   3/18 Manual: trigger point release to IT band; PA and AP mobilization to improve extension  Neuro-re-ed  Air-ex Step on and off air-ex x20  Heel toe on air-ex x20  Heel toe rock x 20 CGA Marching 2x10   There-act: Hurdle walk lateral 10'x4  Fwd hurdle walk 10'x4   Backwards hurdle walk 10'x4   Step up 4 inch 2x10 each leg  Lateral step up 2x10 each leg   3/12  Manual: trigger point release to IT band; PA and  AP mobilization to improve extension  There-ex:  SAQ 3x12 1.5 lns   NuStep 5 minutes reviewed set up level for    Neuro-re-ed  Cable row with abdominal breathing 15 lbs 3x12  Triceps press down life fitness 40 pounds 3 x 12 Leg press 3x12 50 lbs cybex   All perfromed with TA breathing   Step on and off air-ex x20  Heel toe on air-ex x20       PATIENT EDUCATION:  Education details: reviewed HEP; symptom management, activity progression  Person educated: Patient Education method: Explanation, Demonstration, Tactile cues, Verbal cues, and Handouts Education comprehension: verbalized understanding, returned demonstration, verbal cues required, tactile cues required, and needs further education   HOME EXERCISE PROGRAM: Access Code: [email or web address] URL: [email or web address] Date: 01/31/2023 Prepared by: Lorayne Bender  Exercises - Supine Heel Slide with Strap  - 1 x daily - 7 x weekly - 3 sets - 5 reps - 5 sec  hold - Supine Quad Set  - 1 x daily - 7 x weekly - 3 sets - 10 reps - Supine Knee Extension Stretch on Towel Roll  - 1 x daily - 7 x weekly - 3 sets - 5 reps - 5 sec  hold  ASSESSMENT:  CLINICAL IMPRESSION: 4/7 Pt warmed up on the nu-step with no increase in symptoms. We moved on to LTR and glute stretches which pt says has been going really well for her. We performed balance exercises because pt verbalized dizzy feelings ambulating her home. Also worked on Museum/gallery curator and reviewed HEP. Pt was compliant with all interventions  and exercises done today. She demonstrated understanding of HEP along with exercise grading, regressions, and progressions. Pt will continue to benefit from skilled physical therapy to finalize HEP tolerance and progress to discharge.     Eval:  Patient is a 73 y.o. female who was seen today for physical therapy evaluation and treatment for right knee pain. She has had improved knee pain and mobility in the past with PT. She returns today with an acute exacerbation of her knee pain.  She continues on limited knee extension but no significant decline from her previous episode.  She has decreased balance with narrow base with eyes closed.  She is working on her pool program on her own.  She would benefit from skilled therapy to improve balance and continue to work on knee extension. OBJECTIVE IMPAIRMENTS Abnormal gait, decreased activity tolerance, decreased endurance, decreased mobility, difficulty walking, decreased ROM, decreased strength, increased fascial restrictions, and pain.   ACTIVITY LIMITATIONS carrying, bending, standing, squatting, stairs, transfers, and locomotion level  PARTICIPATION LIMITATIONS: cleaning, laundry, shopping, community activity, and yard work  PERSONAL FACTORS 3+ comorbidities: Psoriatic arthritis, Bi-polar , Parkinsons  are also affecting patient's functional outcome.   REHAB POTENTIAL: Good  CLINICAL DECISION MAKING: Evolving/moderate complexity  EVALUATION COMPLEXITY: Moderate   GOALS: Goals reviewed with patient? Yes  SHORT TERM GOALS: Target date: 05/02/2023     Patient will increase gross right LE strength by 5 lbs  Baseline: Goal status: INITIAL  2.  Patient will be increase knee extension passive by 3 degrees  Baseline:  Goal status: Progressing 1/10  3.  Patient will be independent with basic HEP  Baseline:  Goal status: INITIAL  LONG TERM GOALS: Target date: 03/20/2023    Patient will stand for 20 min without pain in order to perform  her ADL's  Baseline:  Goal status: Achieved 1/20  2.  Patient will be independent with full gym  program  Baseline:  Goal status: Progressing 1/9  3.  Patient will ambulate without increased pain community distances  Baseline:  Goal status: INITIAL    PLAN: PT FREQUENCY: 2x/week  PT DURATION: 8 weeks  PLANNED INTERVENTIONS: Therapeutic exercises, Therapeutic activity, Neuromuscular re-education, Balance training, Gait training, Patient/Family education, Joint mobilization, Stair training, DME instructions, Aquatic Therapy, Dry Needling, Electrical stimulation, Cryotherapy, Moist heat, Ultrasound, and Manual therapy  PLAN FOR NEXT SESSION: continue with manual therapy to the knee; progress to standing exercises; progress back to gym activity as able.    Landry Corporal, Student-PT 06/16/2023, 12:07 PM

## 2023-06-23 ENCOUNTER — Ambulatory Visit (INDEPENDENT_AMBULATORY_CARE_PROVIDER_SITE_OTHER): Admitting: Internal Medicine

## 2023-06-23 VITALS — BP 120/76 | HR 59 | Ht 64.0 in | Wt 181.2 lb

## 2023-06-23 DIAGNOSIS — E039 Hypothyroidism, unspecified: Secondary | ICD-10-CM | POA: Diagnosis not present

## 2023-06-23 NOTE — Patient Instructions (Signed)

## 2023-06-23 NOTE — Progress Notes (Unsigned)
 Name: Tiffany [name]  MRN/ DOB: [id number], [birth year]    Age/ Sex: 73 y.o., female    PCP: Jarold Motto, PA   Reason for Endocrinology Evaluation: Hypothyroidism     Date of Initial Endocrinology Evaluation: 12/31/2021    HPI: Tiffany [name] is a 73 y.o. female with a past medical history of Hx of Breast Ca (no XRT no chemo), bipolar disorder, CKD, TD and parkinson's disease. The patient presented for initial endocrinology clinic visit on 12/31/2021 for consultative assistance with her Hypothyroidism.     Most of the history has been obtained from her spouse Pt has been diagnosed with hypothyroidism ~ 30 yrs ago while on Lithium  requiring LT-4 replacement.  She was on lithium until ~ 11/2021 when it was stopped  due to CKD which resulted in lower TSH and fluctuating TFT's    Saw Dr. Talmage Nap which has reduced levothyroxine to  below dose   Sister with thyroid disease  SUBJECTIVE:    Today (06/23/23):  Tiffany [name] is here for follow-up on hypothyroidism . She is accompanied by her spouse today.   She continues to follow-up with rheumatology She continues to follow-up with nephrology through [facility] for CKD  Depression waxes and wanes  For the past 4-6 weeks, has noted chills  Denies constipation  She is on a new anti-depressant ( Mirtazapine) that caused weight gain  Denies local neck swelling  Denies palpitations  Uses a pill box   Levothyroxine 50 mcg, 2 tabs on Sundays  and Wednesday and 1 tab rest of the week     HISTORY:  Past Medical History:  Past Medical History:  Diagnosis Date   Bipolar 1 disorder (HCC)    Chronic diarrhea    loose stools twice a day on average for years   CKD stage G4/A1, 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 1.4 cm  Grade I  invasive lobular, ER and PR +/Her--, Ki 67 <5% Tried anastrozole for one month, but developed suicidal idea   Osteoarthritis, knee 09/24/2019   Xray 09/2019   Parkinsonism (HCC)    Drug induced   Polyposis of colon    Psoriatic arthritis (HCC)    PTSD (post-traumatic stress disorder)    Secondary hyperparathyroidism (HCC)    Tardive dyskinesia    Thyroid disease    Past Surgical History:  Past Surgical History:  Procedure Laterality Date   ABDOMINAL HYSTERECTOMY  1987   CHOLECYSTECTOMY  1979   DILATION AND CURETTAGE OF UTERUS  1973   MASTECTOMY Bilateral 09/19/2014   TONSILLECTOMY  1970   URETERAL REIMPLANTION Bilateral 1974    Social History:  reports that she has never smoked. She has never used smokeless tobacco. She reports that she does not drink alcohol and does not use drugs. Family History: family history includes Breast cancer (age of onset: 44) in her sister; Colon polyps in her sister; Diabetes in her paternal grandfather; Lung cancer in her sister; Lymphoma (age of onset: 33) in her sister; Lymphoma (age of onset: 61) in her mother; Stroke in her maternal grandfather.   HOME MEDICATIONS: Allergies as of 06/23/2023       Reactions   Aripiprazole Other (See Comments)   Parkinsonism     Lactose Intolerance (gi) Diarrhea  Methotrexate Other (See Comments)   Hair loss, severe stomatitis   Nsaids    CKD 4   Cefdinir Diarrhea   Other reaction(s): Diarrhea Yeast infection and fever; negative c diff   Etanercept Other (See Comments)   Headaches   Exemestane Other (See Comments)   Suicidal thoughts with medication   Fluoxetine Other (See Comments)   Parkinsonism   Methylprednisolone Sodium Succ Other (See Comments)   Agitated mania   Other Other (See Comments)   Paper tape causes bruising.   Epinephrine Palpitations   tachycardia   Nitrofurantoin Other (See Comments)    GI upset from 2012        Medication List        Accurate as of June 23, 2023  2:07  PM. If you have any questions, ask your nurse or doctor.          Align 4 MG Caps Take 1 capsule by mouth daily in the afternoon.   amLODipine 5 MG tablet Commonly known as: NORVASC TAKE 1 TABLET(5 MG) BY MOUTH DAILY   atorvastatin 10 MG tablet Commonly known as: LIPITOR TAKE 1 TABLET(10 MG) BY MOUTH DAILY   augmented betamethasone dipropionate 0.05 % cream Commonly known as: DIPROLENE-AF APPLY TO THE AFFECTED AREAS OF LEG TWICE DAILY AS NEEDED FOR RASH. NOT TO FACE, GROIN, UNDERARMS   Cholecalciferol 50 MCG (2000 UT) Tabs 1 tablet Orally Once a day for 30 day(s)   Fish Oil 1200 MG Caps Take 1 capsule by mouth in the morning and at bedtime.   ipratropium 0.03 % nasal spray Commonly known as: ATROVENT Place 2 sprays into both nostrils every 12 (twelve) hours.   levothyroxine 50 MCG tablet Commonly known as: SYNTHROID Take 1 tablet (50 mcg total) by mouth as directed. Take 2 tabs on Sundays and Wednesday,  1 tab rest of the week   LORazepam 0.5 MG tablet Commonly known as: ATIVAN Take 0.5 mg by mouth 3 (three) times daily as needed.   metoprolol succinate 25 MG 24 hr tablet Commonly known as: Toprol XL Take 0.5 tablets (12.5 mg total) by mouth daily.   metroNIDAZOLE 0.75 % gel Commonly known as: METROGEL Apply to face 1-2 times daily   mirtazapine 30 MG tablet Commonly known as: REMERON Take 30 mg by mouth at bedtime.   SIMPONI ARIA IV Infusion every 8 weeks   Vraylar 1.5 MG capsule Generic drug: cariprazine every other day.          REVIEW OF SYSTEMS: A comprehensive ROS was conducted with the patient and is negative except as per HPI   OBJECTIVE:  VS: BP 120/76   Pulse (!) 59   Ht 5\' 4"  (1.626 m)   Wt 181 lb 3.2 oz (82.2 kg)   SpO2 98%   BMI 31.10 kg/m    Wt Readings from Last 3 Encounters:  06/23/23 181 lb 3.2 oz (82.2 kg)  06/11/23 181 lb 6.1 oz (82.3 kg)  04/29/23 185 lb (83.9 kg)     EXAM: General: Pt appears well and is in NAD   Neck: General: Supple without adenopathy. Thyroid: Thyroid size normal.  No goiter or nodules appreciated.   Lungs: Clear with good BS bilat   Heart: Auscultation: RRR.  Extremities:  BL LE: No pretibial edema   Mental Status: Judgment, insight: Intact Orientation: Oriented to time, place, and person Mood and affect: No depression, anxiety, or agitation     DATA REVIEWED:   Latest Reference Range & Units 10/25/22  10:41  TSH 0.35 - 5.50 uIU/mL 4.62  T4,Free(Direct) 0.60 - 1.60 ng/dL 1.61    ASSESSMENT/PLAN/RECOMMENDATIONS:   Hypothyroidism:   -This has been attributed to chronic lithium use.  She has been off lithium since  ~ 11/2021  - pt with depression that has been improving at this time  -TSH tends to fluctuate , recently increased Levothyroxine , will recheck TFT in 6 weeks  - Will try to keep TSH goal 2-3 uIU/mL    Medications : Continue levothyroxine 50 mcg , 2 tabs on Mondays and 1 tab rest of the week   F/U in 4 months Labs in 2 months   Signed electronically by: Natale Bail, MD  [facility]  [facility] [address] [place] [place], [place] [address] Phone: [phone] FAX: [phone]   CC: [place], [place] [address] [place] [address] Phone: [phone] Fax: [phone]   Return to Endocrinology clinic as below: Future Appointments  Date Time Provider Department Center  06/24/2023 11:45 AM Kitty Perkins, PT [facility]  06/27/2023  9:40 AM Elmyra Haggard, MD [facility]  07/01/2023 11:45 AM Kitty Perkins, PT [facility]  07/08/2023  1:00 PM Kitty Perkins, PT [facility]  07/15/2023 11:45 AM Kitty Perkins, PT [facility]  08/29/2023  2:00 PM Jhonny Moss, MD [facility] None  05/03/2024  1:40 PM [facility] ANNUAL WELLNESS VISIT 1 [facility]

## 2023-06-24 ENCOUNTER — Ambulatory Visit (HOSPITAL_BASED_OUTPATIENT_CLINIC_OR_DEPARTMENT_OTHER): Admitting: Physical Therapy

## 2023-06-24 ENCOUNTER — Encounter: Payer: Self-pay | Admitting: Internal Medicine

## 2023-06-24 ENCOUNTER — Encounter (HOSPITAL_BASED_OUTPATIENT_CLINIC_OR_DEPARTMENT_OTHER): Payer: Self-pay | Admitting: Physical Therapy

## 2023-06-24 DIAGNOSIS — M6281 Muscle weakness (generalized): Secondary | ICD-10-CM

## 2023-06-24 DIAGNOSIS — R2689 Other abnormalities of gait and mobility: Secondary | ICD-10-CM

## 2023-06-24 DIAGNOSIS — M25652 Stiffness of left hip, not elsewhere classified: Secondary | ICD-10-CM

## 2023-06-24 DIAGNOSIS — G8929 Other chronic pain: Secondary | ICD-10-CM

## 2023-06-24 LAB — T4, FREE: Free T4: 0.7 ng/dL — ABNORMAL LOW (ref 0.8–1.8)

## 2023-06-24 LAB — TSH: TSH: 2.52 m[IU]/L (ref 0.40–4.50)

## 2023-06-24 MED ORDER — LEVOTHYROXINE SODIUM 75 MCG PO TABS: 75.0000 ug | ORAL_TABLET | Freq: Every day | ORAL | 3 refills | Status: DC

## 2023-06-24 NOTE — Therapy (Signed)
 OUTPATIENT PHYSICAL THERAPY LOWER EXTREMITY Progress Note    Patient Name: Tiffany [name] MRN: [id number] DOB:1950-10-13, 73 y.o., female Today's Date: 06/24/2023   PT End of Session - 06/24/23 1208     Visit Number 16    Number of Visits 19    Date for PT Re-Evaluation 07/02/23    Authorization Type progress note done at visit 9    PT Start Time 1145    PT Stop Time 1228    PT Time Calculation (min) 43 min    Activity Tolerance Patient tolerated treatment well;No increased pain    Behavior During Therapy WFL for tasks assessed/performed                Past Medical History:  Diagnosis Date   Bipolar 1 disorder (HCC)    Chronic diarrhea    loose stools twice a day on average for years   CKD stage G4/A1, 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 1.4 cm  Grade I invasive lobular, ER and PR +/Her--, Ki 67 <5% Tried anastrozole for one month, but developed suicidal idea   Osteoarthritis, knee 09/24/2019   Xray 09/2019   Parkinsonism (HCC)    Drug induced   Polyposis of colon    Psoriatic arthritis (HCC)    PTSD (post-traumatic stress disorder)    Secondary hyperparathyroidism (HCC)    Tardive dyskinesia    Thyroid disease    Past Surgical History:  Procedure Laterality Date   ABDOMINAL HYSTERECTOMY  1987   CHOLECYSTECTOMY  1979   DILATION AND CURETTAGE OF UTERUS  1973   MASTECTOMY Bilateral 09/19/2014   TONSILLECTOMY  1970   URETERAL REIMPLANTION Bilateral 1974   Patient Active Problem List   Diagnosis Date Noted   Raynaud's phenomenon without gangrene 03/26/2023   Family history of breast cancer 06/05/2021   Osteoarthritis, knee 09/24/2019   CKD (chronic kidney disease) stage 4, GFR 15-29 ml/min (HCC) 09/24/2019   Severe recurrent major depression without  psychotic features (HCC) 09/09/2019   Bipolar I disorder, most recent episode depressed (HCC)    Transaminitis    Encephalopathy 04/26/2019   Leukocytosis 04/21/2019   Thrombocytopenia (HCC) 09/04/2018   Vitamin D deficiency 09/04/2018   Psoriatic arthritis (HCC) 09/04/2018   Tubular adenoma of colon 05/28/2017   History of colonic polyps 05/28/2017   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      PCP: Alexander Iba, PA  REFERRING PROVIDER: Alexander Iba, PA  REFERRING DIAG:  [phone] - recurrent pain of right knee  THERAPY DIAG:  Other abnormalities of gait and mobility  Muscle weakness (generalized)  Chronic pain of right knee  Stiffness of left hip, not elsewhere classified  Rationale for Evaluation and Treatment Rehabilitation  ONSET DATE: 3 weeks ago  SUBJECTIVE:   SUBJECTIVE STATEMENT: 4/15 the patient reports she has been  doing well. She had a sharp ain in her hip but it has improved.   PERTINENT HISTORY: Bi-polar, CKD, depression, breast cancer, OA, Parkinson's, Psoriatic Arthritis, PTSD, tardive diskenesia,   PAIN:  Are you having pain? Yes: NPRS scale: 3.5/10 pain  Pain location:IT band  Pain description: aching Aggravating factors: standing, walking , twisting Relieving factors: rest  PRECAUTIONS: None  WEIGHT BEARING RESTRICTIONS No  FALLS:  Has patient fallen in last 6 months? No  LIVING ENVIRONMENT: Lives with husband   OCCUPATION: retired   PLOF: Independent has not had to use a walker  PATIENT GOALS   To have less pain in her knee and to get back into the gym   OBJECTIVE:   DIAGNOSTIC FINDINGS:  Nothing recent   PATIENT SURVEYS:   COGNITION:  Overall cognitive status: Within functional limits for tasks assessed     SENSATION: WFL   POSTURE: rounded shoulders and forward head  PALPATION: Trigger points in lower IT band   LOWER EXTREMITY ROM:  Active PROM Right eval Left eval Right  2/12  Hip flexion     Hip extension     Hip abduction     Hip adduction     Hip internal rotation     Hip external rotation     Knee flexion     Knee extension -7  -5  Ankle dorsiflexion     Ankle plantarflexion     Ankle inversion     Ankle eversion      (Blank rows = not tested)  LOWER EXTREMITY MMT:  MMT Right eval Left eval Right  2/12 Left 2/12   Hip flexion 17.7  17.8 28.5 38.8  Hip extension      Hip abduction 11.1 20.4 32.3 40.4  Hip adduction      Hip internal rotation      Hip external rotation      Knee flexion      Knee extension 18.8 17.3 38.5 51.8  Ankle dorsiflexion      Ankle plantarflexion      Ankle inversion      Ankle eversion       (Blank rows = not tested)   GAIT: Decreased single leg stance time on the right    TODAY'S TREATMENT: 4/15 There-ex: LTR 3x10 each side (pt really enjoys these) Glute stretch 3x30sec each side HEP review Hip abduction machine 40 lbs 3x15  Knee extension machine 10 lbs   Neuro-re-ed:  Row machine 3x12 25 lbs  Press down with posture 3x12 30 lbs   4/7 There-ex: LTR 3x10 each side (pt really enjoys these) Glute stretch 3x30sec each side HEP review   Neuro-Re-ed Air ex step on step off 2x20 Air air L and R upper body rotn for balance with movement 2x5 on air ex, 1x5 on flat surface Step ups 6in 2x10 Step downs 6in 2x10   3/25: Manual:  STM and roller used to IT band and glute  muscles on L side There-ex: LTR 2x10 (felt good) Bridges 2x12 Clamshells red RTB 3x10 Gluteal stretch 3x20 sec hold     Neuro-Re-ed  Bridges 2x12 with abdominal squeeze Shoulder extension with core breathing cable 10lbs 2x10  Row with TA breathing 2x10 15 lbs   3/18 Manual: trigger point release to IT band; PA and AP mobilization to improve extension  Neuro-re-ed  Air-ex Step on and off air-ex x20  Heel toe on air-ex x20  Heel toe rock x 20 CGA  Marching 2x10   There-act: Hurdle walk lateral 10'x4  Fwd hurdle walk 10'x4   Backwards hurdle walk 10'x4   Step up 4 inch 2x10 each leg  Lateral step up 2x10 each leg   3/12  Manual: trigger point release to IT band; PA and AP mobilization to improve extension  There-ex:  SAQ 3x12 1.5 lns   NuStep 5 minutes reviewed set up level for    Neuro-re-ed  Cable row with abdominal breathing 15 lbs 3x12  Triceps press down life fitness 40 pounds 3 x 12 Leg press 3x12 50 lbs cybex   All perfromed with TA breathing   Step on and off air-ex x20  Heel toe on air-ex x20       PATIENT EDUCATION:  Education details: reviewed HEP; symptom management, activity progression  Person educated: Patient Education method: Explanation, Demonstration, Tactile cues, Verbal cues, and Handouts Education comprehension: verbalized understanding, returned demonstration, verbal cues required, tactile cues required, and needs further education   HOME EXERCISE PROGRAM: Access Code: [email or web address] URL: [email or web address] Date: 01/31/2023 Prepared by: Lorayne Bender  Exercises - Supine Heel Slide with Strap  - 1 x daily - 7 x weekly - 3 sets - 5 reps - 5 sec  hold - Supine Quad Set  - 1 x daily - 7 x weekly - 3 sets - 10 reps - Supine Knee Extension Stretch on Towel Roll  - 1 x daily - 7 x weekly - 3 sets - 5 reps - 5 sec  hold  ASSESSMENT:  CLINICAL IMPRESSION: 4/15 The patient tolerated treatment well. She had no  significant increase in pain. We continue to expand her gym program. We added in knee extensions today in a low range. She had no increase in pain. We will continue to advance the exercise program as tolerated.    Eval:  Patient is a 73 y.o. female who was seen today for physical therapy evaluation and treatment for right knee pain. She has had improved knee pain and mobility in the past with PT. She returns today with an acute exacerbation of her knee pain.  She continues on limited knee extension but no significant decline from her previous episode.  She has decreased balance with narrow base with eyes closed.  She is working on her pool program on her own.  She would benefit from skilled therapy to improve balance and continue to work on knee extension. OBJECTIVE IMPAIRMENTS Abnormal gait, decreased activity tolerance, decreased endurance, decreased mobility, difficulty walking, decreased ROM, decreased strength, increased fascial restrictions, and pain.   ACTIVITY LIMITATIONS carrying, bending, standing, squatting, stairs, transfers, and locomotion level  PARTICIPATION LIMITATIONS: cleaning, laundry, shopping, community activity, and yard work  PERSONAL FACTORS 3+ comorbidities: Psoriatic arthritis, Bi-polar , Parkinsons  are also affecting patient's functional outcome.   REHAB POTENTIAL: Good  CLINICAL DECISION MAKING: Evolving/moderate complexity  EVALUATION COMPLEXITY: Moderate   GOALS: Goals reviewed with patient? Yes  SHORT TERM GOALS: Target date: 05/02/2023     Patient will increase gross right LE strength by 5 lbs  Baseline: Goal status: INITIAL  2.  Patient will be increase knee extension passive by 3 degrees  Baseline:  Goal status: Progressing 1/10  3.  Patient will be independent with basic HEP  Baseline:  Goal status: INITIAL  LONG TERM GOALS: Target date: 03/20/2023    Patient will stand for 20 min without pain in order to perform her ADL's  Baseline:   Goal status: Achieved 1/20  2.  Patient will  be independent with full gym program  Baseline:  Goal status: Progressing 1/9  3.  Patient will ambulate without increased pain community distances  Baseline:  Goal status: INITIAL    PLAN: PT FREQUENCY: 2x/week  PT DURATION: 8 weeks  PLANNED INTERVENTIONS: Therapeutic exercises, Therapeutic activity, Neuromuscular re-education, Balance training, Gait training, Patient/Family education, Joint mobilization, Stair training, DME instructions, Aquatic Therapy, Dry Needling, Electrical stimulation, Cryotherapy, Moist heat, Ultrasound, and Manual therapy  PLAN FOR NEXT SESSION: continue with manual therapy to the knee; progress to standing exercises; progress back to gym activity as able.    Kitty Perkins, PT 06/24/2023, 1:34 PM

## 2023-06-26 ENCOUNTER — Encounter: Payer: Self-pay | Admitting: Internal Medicine

## 2023-06-27 ENCOUNTER — Encounter: Payer: Self-pay | Admitting: Internal Medicine

## 2023-06-27 ENCOUNTER — Ambulatory Visit: Payer: Medicare Other | Attending: Cardiology | Admitting: Internal Medicine

## 2023-06-27 VITALS — BP 128/76 | HR 75 | Ht 64.0 in | Wt 181.6 lb

## 2023-06-27 DIAGNOSIS — R002 Palpitations: Secondary | ICD-10-CM | POA: Insufficient documentation

## 2023-06-27 DIAGNOSIS — R0600 Dyspnea, unspecified: Secondary | ICD-10-CM | POA: Insufficient documentation

## 2023-06-27 MED ORDER — ATORVASTATIN CALCIUM 10 MG PO TABS
5.0000 mg | ORAL_TABLET | Freq: Every day | ORAL | 3 refills | Status: AC
Start: 2023-06-27 — End: ?

## 2023-06-27 NOTE — Progress Notes (Unsigned)
 Cardiology Office Note   Date:  06/29/2023   ID:  Tiffany, [name] 19-Feb-1951, MRN [id number]  PCP:  Alexander Iba, PA  Cardiologist:   Ola Berger, MD   Pt presents for follow up of dyspnea   History of Present Illness: Tiffany [name] is a 73 y.o. female with a history of dyspnea, bipolar disorder, CKD, tardive dyskinesia   The pt is followed by Dulcie Gibes PA  I saw her early in 2024   Echo was normal   Monitor showed no significant arrhythmias    Myoview  scan in March 2024 showed normal perfusion  Mild TID  1.22   When I saw her in Aug 2024 she was still complaining of SOBwith exertion   I recomm LHC to define anatomy  Since seen she did not have LHC done   She says her breathing is good    She denies CP   No SOB   She is more active, going to [employer] regularly   Works out in water    She denies palpitations   Current Meds  Medication Sig   amLODipine  (NORVASC ) 5 MG tablet TAKE 1 TABLET(5 MG) BY MOUTH DAILY   augmented betamethasone dipropionate (DIPROLENE-AF) 0.05 % cream APPLY TO THE AFFECTED AREAS OF LEG TWICE DAILY AS NEEDED FOR RASH. NOT TO FACE, GROIN, UNDERARMS   cariprazine (VRAYLAR) 1.5 MG capsule every other day.   Cholecalciferol  50 MCG (2000 UT) TABS 1 tablet Orally Once a day for 30 day(s)   Golimumab (SIMPONI ARIA IV) Infusion every 8 weeks   ipratropium (ATROVENT ) 0.03 % nasal spray Place 2 sprays into both nostrils every 12 (twelve) hours.   levothyroxine  (SYNTHROID ) 75 MCG tablet Take 1 tablet (75 mcg total) by mouth daily.   LORazepam  (ATIVAN ) 0.5 MG tablet Take 0.5 mg by mouth 3 (three) times daily as needed.   metoprolol  succinate (TOPROL  XL) 25 MG 24 hr tablet Take 0.5 tablets (12.5 mg total) by mouth daily.   metroNIDAZOLE  (METROGEL ) 0.75 % gel Apply to face 1-2 times daily   mirtazapine (REMERON) 30 MG tablet Take 30 mg by mouth at bedtime.   Omega-3 Fatty Acids (FISH OIL) 1200 MG CAPS Take 1 capsule by mouth in the morning and at bedtime.   Probiotic  Product (ALIGN) 4 MG CAPS Take 1 capsule by mouth daily in the afternoon.   [DISCONTINUED] atorvastatin  (LIPITOR) 10 MG tablet TAKE 1 TABLET(10 MG) BY MOUTH DAILY     Allergies:   Aripiprazole, Lactose intolerance (gi), Methotrexate, Nsaids, Cefdinir, Etanercept, Exemestane, Fluoxetine, Methylprednisolone  sodium succ, Other, Epinephrine , and Nitrofurantoin   Past Medical History:  Diagnosis Date   Bipolar 1 disorder (HCC)    Chronic diarrhea    loose stools twice a day on average for years   CKD stage G4/A1, 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 1.4 cm  Grade I invasive lobular, ER and PR +/Her--, Ki 67 <5% Tried anastrozole for one month, but developed suicidal idea   Osteoarthritis, knee 09/24/2019   Xray 09/2019   Parkinsonism (HCC)    Drug induced   Polyposis of colon    Psoriatic arthritis (HCC)    PTSD (post-traumatic stress disorder)    Secondary hyperparathyroidism (HCC)    Tardive dyskinesia    Thyroid  disease  Past Surgical History:  Procedure Laterality Date   ABDOMINAL HYSTERECTOMY  1987   CHOLECYSTECTOMY  1979   DILATION AND CURETTAGE OF UTERUS  1973   MASTECTOMY Bilateral 09/19/2014   TONSILLECTOMY  1970   URETERAL REIMPLANTION Bilateral 1974     Social History:  The patient  reports that she has never smoked. She has never used smokeless tobacco. She reports that she does not drink alcohol and does not use drugs.   Family History:  The patient's family history includes Breast cancer (age of onset: 37) in her sister; Colon polyps in her sister; Diabetes in her paternal grandfather; Lung cancer in her sister; Lymphoma (age of onset: 22) in her sister; Lymphoma (age of onset: 73) in her mother; Stroke in her maternal grandfather.    ROS:  Please see the  history of present illness. All other systems are reviewed and  Negative to the above problem except as noted.    PHYSICAL EXAM: VS:  BP 128/76   Pulse 75   Ht 5\' 4"  (1.626 m)   Wt 181 lb 9.6 oz (82.4 kg)   SpO2 96%   BMI 31.17 kg/m   GEN: Obese 73 yo  in no acute distress  HEENT: normal  Neck: no JVD,,  Cardiac: RRR; no murmurs  No LE edema  Respiratory:  clear to auscultation  GI: soft, nontender No hepatomegaly     EKG:  EKG is not ordered today.    Lipid Panel    Component Value Date/Time   CHOL 182 12/10/2021 1044   TRIG 89.0 12/10/2021 1044   HDL 59.90 12/10/2021 1044   CHOLHDL 3 12/10/2021 1044   VLDL 17.8 12/10/2021 1044   LDLCALC 105 (H) 12/10/2021 1044      Wt Readings from Last 3 Encounters:  06/27/23 181 lb 9.6 oz (82.4 kg)  06/23/23 181 lb 3.2 oz (82.2 kg)  06/11/23 181 lb 6.1 oz (82.3 kg)      ASSESSMENT AND PLAN:  1  Dypsnea  PT with hx of dysnea   Myoview  in 2024 showed normal perfusion.     Currently doing well without SOB     Stay active      2  HL On atorvastatin   Lipids are excellent  LDL 57  HDL 73 3 HTN  BP is controlled   Follow   4  Hx palpitations   Pt on Toprol  XL 12.5 mg    Doing well  No complaints  5 Renal  Cr 2.26  Follows at [facility] in nephrology     6  Metabolics   A1C is 5.2       Current medicines are reviewed at length with the patient today.  The patient does not have concerns regarding medicines.  Signed, Ola Berger, MD  06/29/2023 10:48 PM    [facility] [address] [place], [place], [place]  [address] Phone: [phone]; Fax: [phone]

## 2023-06-27 NOTE — Patient Instructions (Signed)
 Medication Instructions:  Your physician has recommended you make the following change in your medication:   1) DECREASE atorvastatin  (Lipitor) to 5 mg daily  *If you need a refill on your cardiac medications before your next appointment, please call your pharmacy*  Lab Work: In December at [facility]: lipomed panel and LP(a) You may go to any of these [facility] locations:   [facility] - [address] ([facility]) - [address] [phone] [address]  If you have labs (blood work) drawn today and your tests are completely normal, you will receive your results only by: [employer] (if you have MyChart) OR A paper copy in the mail If you have any lab test that is abnormal or we need to change your treatment, we will call you to review the results.  Follow-Up: At [facility], you and your health needs are our priority.  As part of our continuing mission to provide you with exceptional heart care, we have created designated Provider Care Teams.  These Care Teams include your primary Cardiologist (physician) and Advanced Practice Providers (APPs -  Physician Assistants and Nurse Practitioners) who all work together to provide you with the care you need, when you need it.  Your next appointment:   9 month(s)  The format for your next appointment:   In Person  Provider:   Ola Berger, MD {  Other Instructions   1st Floor: - Lobby - Registration  - Pharmacy  - Lab - Cafe  2nd Floor: - PV Lab - Diagnostic Testing (echo, CT, nuclear med)  3rd Floor: - Vacant  4th Floor: - TCTS (cardiothoracic surgery) - AFib Clinic - Structural Heart Clinic - Vascular Surgery  - Vascular Ultrasound  5th Floor: - HeartCare Cardiology (general and EP) - Clinical Pharmacy for coumadin, hypertension, lipid, weight-loss medications, and med management appointments    Valet parking services will be available as well.

## 2023-07-01 ENCOUNTER — Ambulatory Visit (HOSPITAL_BASED_OUTPATIENT_CLINIC_OR_DEPARTMENT_OTHER): Admitting: Physical Therapy

## 2023-07-01 ENCOUNTER — Encounter (HOSPITAL_BASED_OUTPATIENT_CLINIC_OR_DEPARTMENT_OTHER): Payer: Self-pay | Admitting: Physical Therapy

## 2023-07-01 DIAGNOSIS — R2689 Other abnormalities of gait and mobility: Secondary | ICD-10-CM

## 2023-07-01 DIAGNOSIS — G8929 Other chronic pain: Secondary | ICD-10-CM

## 2023-07-01 DIAGNOSIS — M6281 Muscle weakness (generalized): Secondary | ICD-10-CM

## 2023-07-01 NOTE — Therapy (Signed)
 OUTPATIENT PHYSICAL THERAPY LOWER EXTREMITY Progress Note    Patient Name: Tiffany [name] MRN: [id number] DOB:Oct 18, 1950, 73 y.o., female Today's Date: 07/02/2023   PT End of Session - 07/01/23 1145     Visit Number 17    Number of Visits 23    Date for PT Re-Evaluation 08/12/23    Authorization Type progress note done at visit 17    PT Start Time 1146    PT Stop Time 1227    PT Time Calculation (min) 41 min    Activity Tolerance Patient tolerated treatment well;No increased pain    Behavior During Therapy WFL for tasks assessed/performed                 Past Medical History:  Diagnosis Date   Bipolar 1 disorder (HCC)    Chronic diarrhea    loose stools twice a day on average for years   CKD stage G4/A1, 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 1.4 cm  Grade I invasive lobular, ER and PR +/Her--, Ki 67 <5% Tried anastrozole for one month, but developed suicidal idea   Osteoarthritis, knee 09/24/2019   Xray 09/2019   Parkinsonism (HCC)    Drug induced   Polyposis of colon    Psoriatic arthritis (HCC)    PTSD (post-traumatic stress disorder)    Secondary hyperparathyroidism (HCC)    Tardive dyskinesia    Thyroid  disease    Past Surgical History:  Procedure Laterality Date   ABDOMINAL HYSTERECTOMY  1987   CHOLECYSTECTOMY  1979   DILATION AND CURETTAGE OF UTERUS  1973   MASTECTOMY Bilateral 09/19/2014   TONSILLECTOMY  1970   URETERAL REIMPLANTION Bilateral 1974   Patient Active Problem List   Diagnosis Date Noted   Raynaud's phenomenon without gangrene 03/26/2023   Family history of breast cancer 06/05/2021   Osteoarthritis, knee 09/24/2019   CKD (chronic kidney disease) stage 4, GFR 15-29 ml/min (HCC) 09/24/2019   Severe recurrent major depression without  psychotic features (HCC) 09/09/2019   Bipolar I disorder, most recent episode depressed (HCC)    Transaminitis    Encephalopathy 04/26/2019   Leukocytosis 04/21/2019   Thrombocytopenia (HCC) 09/04/2018   Vitamin D  deficiency 09/04/2018   Psoriatic arthritis (HCC) 09/04/2018   Tubular adenoma of colon 05/28/2017   History of colonic polyps 05/28/2017   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      PCP: Alexander Iba, PA  REFERRING PROVIDER: Alexander Iba, PA  REFERRING DIAG:  [id number] - recurrent pain of right knee  THERAPY DIAG:  Other abnormalities of gait and mobility  Muscle weakness (generalized)  Chronic pain of right knee  Rationale for Evaluation and Treatment Rehabilitation  ONSET DATE: 3 weeks ago  SUBJECTIVE:   SUBJECTIVE STATEMENT: 4/22 Pt said her knee has felt like it has been popping and locking  but that it does not last. Sleep was not great last night; verbalized a lot of turning and difficulty staying asleep.   PERTINENT HISTORY: Bi-polar, CKD, depression, breast cancer, OA, Parkinson's, Psoriatic Arthritis, PTSD, tardive diskenesia,   PAIN:  Are you having pain? Yes: NPRS scale: 3.5/10 pain  Pain location:IT band  Pain description: aching Aggravating factors: standing, walking , twisting Relieving factors: rest  PRECAUTIONS: None  WEIGHT BEARING RESTRICTIONS No  FALLS:  Has patient fallen in last 6 months? No  LIVING  ENVIRONMENT: Lives with husband  OCCUPATION: retired   PLOF: Independent has not had to use a walker  PATIENT GOALS   To have less pain in her knee and to get back into the gym   OBJECTIVE:   DIAGNOSTIC FINDINGS:  Nothing recent   PATIENT SURVEYS:   COGNITION:  Overall cognitive status: Within functional limits for tasks assessed     SENSATION: WFL   POSTURE: rounded shoulders and forward head  PALPATION: Trigger points in lower IT band   LOWER EXTREMITY ROM:  Active PROM Right eval Left eval Right  2/12  Hip flexion     Hip extension     Hip abduction     Hip adduction     Hip internal rotation     Hip external rotation     Knee flexion     Knee extension -7  -5  Ankle dorsiflexion     Ankle plantarflexion     Ankle inversion     Ankle eversion      (Blank rows = not tested)  LOWER EXTREMITY MMT:  MMT Right eval Left eval Right  2/12 Left 2/12  Right 4/22 Left 4/22  Hip flexion 17.7  17.8 28.5 38.8 38 38.8  Hip extension        Hip abduction 11.1 20.4 32.3 40.4 48.1 44.5  Hip adduction        Hip internal rotation        Hip external rotation        Knee flexion        Knee extension 18.8 17.3 38.5 51.8 44.9 49.3  Ankle dorsiflexion        Ankle plantarflexion        Ankle inversion        Ankle eversion         (Blank rows = not tested)   GAIT: 4/22 Similar stance time bilaterally   TODAY'S TREATMENT: 4/22 There-ex: Nu-step warm up lvl 4 Strength testing for re-eval There-Act Rope hammer curls 3x10 5lbs Neuro-Re-ed  Row machine 3x10 20lbs RPE 5  Step up onto air ex 2x10 (felt twinge in back of knee on last rep of 2nd set)  Standing rope pullovers 3x10 5lbs   4/15 There-ex: LTR 3x10 each side (pt really enjoys these) Glute stretch 3x30sec each side HEP review Hip abduction machine 40 lbs 3x15  Knee extension machine 10 lbs   Neuro-re-ed:  Row machine 3x12 25 lbs  Press down with  posture 3x12 30 lbs   4/7 There-ex: LTR 3x10 each side (pt really enjoys these) Glute stretch 3x30sec each side HEP review   Neuro-Re-ed Air ex step on step off 2x20 Air air L and R upper body rotn for balance with movement 2x5 on air ex, 1x5 on flat surface Step ups 6in 2x10 Step downs 6in 2x10   3/25: Manual:  STM and roller used to IT band and  glute muscles on L side There-ex: LTR 2x10 (felt good) Bridges 2x12 Clamshells red RTB 3x10 Gluteal stretch 3x20 sec hold     Neuro-Re-ed  Bridges 2x12 with abdominal squeeze Shoulder extension with core breathing cable 10lbs 2x10  Row with TA breathing 2x10 15 lbs   3/18 Manual: trigger point release to IT band; PA and AP mobilization to improve extension  Neuro-re-ed  Air-ex Step on and off air-ex x20  Heel toe on air-ex x20  Heel toe rock x 20 CGA Marching 2x10   There-act: Hurdle walk lateral 10'x4  Fwd hurdle walk 10'x4   Backwards hurdle walk 10'x4   Step up 4 inch 2x10 each leg  Lateral step up 2x10 each leg   3/12  Manual: trigger point release to IT band; PA and AP mobilization to improve extension  There-ex:  SAQ 3x12 1.5 lns   NuStep 5 minutes reviewed set up level for    Neuro-re-ed  Cable row with abdominal breathing 15 lbs 3x12  Triceps press down life fitness 40 pounds 3 x 12 Leg press 3x12 50 lbs cybex   All perfromed with TA breathing   Step on and off air-ex x20  Heel toe on air-ex x20       PATIENT EDUCATION:  Education details: reviewed HEP; symptom management, activity progression  Person educated: Patient Education method: Explanation, Demonstration, Tactile cues, Verbal cues, and Handouts Education comprehension: verbalized understanding, returned demonstration, verbal cues required, tactile cues required, and needs further education   HOME EXERCISE PROGRAM: Access Code: [email or web address] URL: [email or web address] Date:  01/31/2023 Prepared by: Signa Drier  Exercises - Supine Heel Slide with Strap  - 1 x daily - 7 x weekly - 3 sets - 5 reps - 5 sec  hold - Supine Quad Set  - 1 x daily - 7 x weekly - 3 sets - 10 reps - Supine Knee Extension Stretch on Towel Roll  - 1 x daily - 7 x weekly - 3 sets - 5 reps - 5 sec  hold  ASSESSMENT:  CLINICAL IMPRESSION: 4/22 Pt warmed up on the nu step for with no increase in symptoms. Re-eval was done today with data noted in objective section. Education was provided on strength increases. Gym exercises were performed with focusing on abdominal bracing and postural stability. Pt has shown improvement with community ambulation and strength. Functionally she is improving with household tasks and ADL's. Pt has responded well to physical therapy noting improvements in exercises with technique, form, muscle activation. Pt will continue to benefit from skilled physical therapy to increase functional strength and balance for ADL's.    Eval:  Patient is a 73 y.o. female who was seen today for physical therapy evaluation and treatment for right knee pain. She has had improved knee pain and mobility in the past with PT. She returns today with an acute exacerbation of her knee pain.  She continues on limited knee extension but no significant decline from her previous episode.  She has decreased balance with narrow base with eyes closed.  She is working on her pool program on her own.  She would benefit from skilled therapy to improve balance and continue to work on knee extension. OBJECTIVE IMPAIRMENTS Abnormal gait, decreased activity tolerance, decreased endurance, decreased mobility, difficulty walking, decreased ROM, decreased strength, increased fascial restrictions, and pain.   ACTIVITY LIMITATIONS carrying, bending, standing, squatting, stairs, transfers, and locomotion level  PARTICIPATION LIMITATIONS: cleaning, laundry, shopping, community activity, and yard work  PERSONAL  FACTORS 3+ comorbidities: Psoriatic arthritis, Bi-polar , Parkinsons  are also affecting patient's functional outcome.   REHAB POTENTIAL: Good  CLINICAL DECISION MAKING: Evolving/moderate complexity  EVALUATION COMPLEXITY: Moderate   GOALS: Goals reviewed with patient? Yes  SHORT TERM GOALS: Target date: 05/02/2023     Patient will increase gross right LE strength by 5 lbs  Baseline: Goal status: Pt met goal 4/22  2.  Patient will be increase knee extension passive by 3 degrees  Baseline:  Goal status: Pt met goal 4/22  3.  Patient will be independent with basic HEP  Baseline:  Goal status: Pt met goal 4/22  LONG TERM GOALS: Target date: 03/20/2023    Patient will stand for 30 min without pain in order to perform her ADL's  Baseline:  Goal status: Pt is progressing 4/22 updated to 30 min   2.  Patient will be independent with full gym program  Baseline:  Goal status: Pt is progressing 4/22  3.  Patient will ambulate without increased pain community distances  Baseline:  Goal status: Pt is progressing 4/22    PLAN: PT FREQUENCY: 2x/week  PT DURATION: 8 weeks  PLANNED INTERVENTIONS: Therapeutic exercises, Therapeutic activity, Neuromuscular re-education, Balance training, Gait training, Patient/Family education, Joint mobilization, Stair training, DME instructions, Aquatic Therapy, Dry Needling, Electrical stimulation, Cryotherapy, Moist heat, Ultrasound, and Manual therapy  PLAN FOR NEXT SESSION: continue with manual therapy to the knee; progress to standing exercises; progress back to gym activity as able.    Kitty Perkins, PT 07/02/2023, 1:14 PM

## 2023-07-03 ENCOUNTER — Other Ambulatory Visit: Payer: Self-pay | Admitting: Physician Assistant

## 2023-07-08 ENCOUNTER — Ambulatory Visit (HOSPITAL_BASED_OUTPATIENT_CLINIC_OR_DEPARTMENT_OTHER): Admitting: Physical Therapy

## 2023-07-08 ENCOUNTER — Encounter (HOSPITAL_BASED_OUTPATIENT_CLINIC_OR_DEPARTMENT_OTHER): Payer: Self-pay | Admitting: Physical Therapy

## 2023-07-08 DIAGNOSIS — M25652 Stiffness of left hip, not elsewhere classified: Secondary | ICD-10-CM

## 2023-07-08 DIAGNOSIS — M25552 Pain in left hip: Secondary | ICD-10-CM

## 2023-07-08 DIAGNOSIS — R2689 Other abnormalities of gait and mobility: Secondary | ICD-10-CM

## 2023-07-08 DIAGNOSIS — M6281 Muscle weakness (generalized): Secondary | ICD-10-CM

## 2023-07-08 DIAGNOSIS — G8929 Other chronic pain: Secondary | ICD-10-CM

## 2023-07-08 DIAGNOSIS — R2681 Unsteadiness on feet: Secondary | ICD-10-CM

## 2023-07-08 NOTE — Therapy (Unsigned)
 OUTPATIENT PHYSICAL THERAPY LOWER EXTREMITY Progress Note    Patient Name: Tiffany [name] MRN: [id number] DOB:01/02/1951, 73 y.o., female Today's Date: 07/08/2023   PT End of Session - 07/08/23 1305     Visit Number 18    Number of Visits 23    Date for PT Re-Evaluation 08/12/23    Authorization Type progress note done at visit 17    PT Start Time 1300    PT Stop Time 1342    PT Time Calculation (min) 42 min    Activity Tolerance Patient tolerated treatment well;No increased pain    Behavior During Therapy WFL for tasks assessed/performed                 Past Medical History:  Diagnosis Date   Bipolar 1 disorder (HCC)    Chronic diarrhea    loose stools twice a day on average for years   CKD stage G4/A1, 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 1.4 cm  Grade I invasive lobular, ER and PR +/Her--, Ki 67 <5% Tried anastrozole for one month, but developed suicidal idea   Osteoarthritis, knee 09/24/2019   Xray 09/2019   Parkinsonism (HCC)    Drug induced   Polyposis of colon    Psoriatic arthritis (HCC)    PTSD (post-traumatic stress disorder)    Secondary hyperparathyroidism (HCC)    Tardive dyskinesia    Thyroid  disease    Past Surgical History:  Procedure Laterality Date   ABDOMINAL HYSTERECTOMY  1987   CHOLECYSTECTOMY  1979   DILATION AND CURETTAGE OF UTERUS  1973   MASTECTOMY Bilateral 09/19/2014   TONSILLECTOMY  1970   URETERAL REIMPLANTION Bilateral 1974   Patient Active Problem List   Diagnosis Date Noted   Raynaud's phenomenon without gangrene 03/26/2023   Family history of breast cancer 06/05/2021   Osteoarthritis, knee 09/24/2019   CKD (chronic kidney disease) stage 4, GFR 15-29 ml/min (HCC) 09/24/2019   Severe recurrent major depression without  psychotic features (HCC) 09/09/2019   Bipolar I disorder, most recent episode depressed (HCC)    Transaminitis    Encephalopathy 04/26/2019   Leukocytosis 04/21/2019   Thrombocytopenia (HCC) 09/04/2018   Vitamin D  deficiency 09/04/2018   Psoriatic arthritis (HCC) 09/04/2018   Tubular adenoma of colon 05/28/2017   History of colonic polyps 05/28/2017   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      PCP: Alexander Iba, PA  REFERRING PROVIDER: Alexander Iba, PA  REFERRING DIAG:  [phone] - recurrent pain of right knee  THERAPY DIAG:  Other abnormalities of gait and mobility  Muscle weakness (generalized)  Chronic pain of right knee  Stiffness of left hip, not elsewhere classified  Pain in left hip  Unsteadiness on feet  Rationale for Evaluation and Treatment Rehabilitation  ONSET DATE: 3 weeks ago  SUBJECTIVE:  SUBJECTIVE STATEMENT: The patient had mild locking in flexion yesterday. She came to the pool and worked it out and she got better.    PERTINENT HISTORY: Bi-polar, CKD, depression, breast cancer, OA, Parkinson's, Psoriatic Arthritis, PTSD, tardive diskenesia,   PAIN:  Are you having pain? Yes: NPRS scale: 3.5/10 pain  Pain location:IT band  Pain description: aching Aggravating factors: standing, walking , twisting Relieving factors: rest  PRECAUTIONS: None  WEIGHT BEARING RESTRICTIONS No  FALLS:  Has patient fallen in last 6 months?  No  LIVING ENVIRONMENT: Lives with husband  OCCUPATION: retired   PLOF: Independent has not had to use a walker  PATIENT GOALS   To have less pain in her knee and to get back into the gym   OBJECTIVE:   DIAGNOSTIC FINDINGS:  Nothing recent   PATIENT SURVEYS:   COGNITION:  Overall cognitive status: Within functional limits for tasks assessed     SENSATION: WFL   POSTURE: rounded shoulders and forward head  PALPATION: Trigger points in lower IT band   LOWER EXTREMITY ROM:  Active PROM Right eval Left eval Right  2/12  Hip flexion     Hip extension     Hip abduction     Hip adduction     Hip internal rotation     Hip external rotation     Knee flexion     Knee extension -7  -5  Ankle dorsiflexion     Ankle plantarflexion     Ankle inversion     Ankle eversion      (Blank rows = not tested)  LOWER EXTREMITY MMT:  MMT Right eval Left eval Right  2/12 Left 2/12  Right 4/22 Left 4/22  Hip flexion 17.7  17.8 28.5 38.8 38 38.8  Hip extension        Hip abduction 11.1 20.4 32.3 40.4 48.1 44.5  Hip adduction        Hip internal rotation        Hip external rotation        Knee flexion        Knee extension 18.8 17.3 38.5 51.8 44.9 49.3  Ankle dorsiflexion        Ankle plantarflexion        Ankle inversion        Ankle eversion         (Blank rows = not tested)   GAIT: 4/22 Similar stance time bilaterally   TODAY'S TREATMENT:    4/29 There-ex:  Nu-step warm up lvl 4 Hamstring stretch 3x20 sec hold  Leg press 55 lbs cybex 3x10  Hip abduction 55 lbs 3x10   Nuero-re-ed:  Row 3x10 15 lbs  Shoulder extension 3x10 15 lbs  Triceps press down 3x10 40 lbs       4/22 There-ex: Nu-step warm up lvl 4 Strength testing for re-eval There-Act Rope hammer curls 3x10 5lbs Neuro-Re-ed  Row machine 3x10 20lbs RPE 5  Step up onto air ex 2x10 (felt twinge in back of knee on last rep of 2nd set)  Standing rope  pullovers 3x10 5lbs   4/15 There-ex: LTR 3x10 each side (pt really enjoys these) Glute stretch 3x30sec each side HEP review Hip abduction machine 40 lbs 3x15  Knee extension machine 10 lbs   Neuro-re-ed:  Row machine 3x12 25 lbs  Press down with posture 3x12 30 lbs   4/7 There-ex: LTR 3x10 each side (pt really enjoys these)  Glute stretch 3x30sec each side HEP review   Neuro-Re-ed Air ex step on step off 2x20 Air air L and R upper body rotn for balance with movement 2x5 on air ex, 1x5 on flat surface Step ups 6in 2x10 Step downs 6in 2x10   3/25: Manual:  STM and roller used to IT band and glute muscles on L side There-ex: LTR 2x10 (felt good) Bridges 2x12 Clamshells red RTB 3x10 Gluteal stretch 3x20 sec hold     Neuro-Re-ed  Bridges 2x12 with abdominal squeeze Shoulder extension with core breathing cable 10lbs 2x10  Row with TA breathing 2x10 15 lbs   3/18 Manual: trigger point release to IT band; PA and AP mobilization to improve extension  Neuro-re-ed  Air-ex Step on and off air-ex x20  Heel toe on air-ex x20  Heel toe rock x 20 CGA Marching 2x10   There-act: Hurdle walk lateral 10'x4  Fwd hurdle walk 10'x4   Backwards hurdle walk 10'x4   Step up 4 inch 2x10 each leg  Lateral step up 2x10 each leg   3/12  Manual: trigger point release to IT band; PA and AP mobilization to improve extension  There-ex:  SAQ 3x12 1.5 lns   NuStep 5 minutes reviewed set up level for    Neuro-re-ed  Cable row with abdominal breathing 15 lbs 3x12  Triceps press down life fitness 40 pounds 3 x 12 Leg press 3x12 50 lbs cybex   All perfromed with TA breathing   Step on and off air-ex x20  Heel toe on air-ex x20       PATIENT EDUCATION:  Education details: reviewed HEP; symptom management, activity progression  Person educated: Patient Education method: Explanation, Demonstration, Tactile cues, Verbal cues, and  Handouts Education comprehension: verbalized understanding, returned demonstration, verbal cues required, tactile cues required, and needs further education   HOME EXERCISE PROGRAM: Access Code: [email or web address] URL: [email or web address] Date: 01/31/2023 Prepared by: Signa Drier  Exercises - Supine Heel Slide with Strap  - 1 x daily - 7 x weekly - 3 sets - 5 reps - 5 sec  hold - Supine Quad Set  - 1 x daily - 7 x weekly - 3 sets - 10 reps - Supine Knee Extension Stretch on Towel Roll  - 1 x daily - 7 x weekly - 3 sets - 5 reps - 5 sec  hold  ASSESSMENT:  CLINICAL IMPRESSION:  The patient continues to progress well. We advanced her weights without difficulty. She has been able to independently manage her knee for more than a month now. She has been coming to the pool and has done well in the gym. She has been adjusting her own weights and setting up the equipment independently. We will likely D/C to HEP next visit.    Eval:  Patient is a 73 y.o. female who was seen today for physical therapy evaluation and treatment for right knee pain. She has had improved knee pain and mobility in the past with PT. She returns today with an acute exacerbation of her knee pain.  She continues on limited knee extension but no significant decline from her previous episode.  She has decreased balance with narrow base with eyes closed.  She is working on her pool program on her own.  She would benefit from skilled therapy to improve balance and continue to work on knee extension. OBJECTIVE IMPAIRMENTS Abnormal gait, decreased activity tolerance, decreased endurance, decreased mobility, difficulty walking, decreased ROM, decreased strength, increased fascial restrictions,  and pain.   ACTIVITY LIMITATIONS carrying, bending, standing, squatting, stairs, transfers, and locomotion level  PARTICIPATION LIMITATIONS: cleaning, laundry, shopping, community activity, and yard work  PERSONAL FACTORS 3+  comorbidities: Psoriatic arthritis, Bi-polar , Parkinsons  are also affecting patient's functional outcome.   REHAB POTENTIAL: Good  CLINICAL DECISION MAKING: Evolving/moderate complexity  EVALUATION COMPLEXITY: Moderate   GOALS: Goals reviewed with patient? Yes  SHORT TERM GOALS: Target date: 05/02/2023     Patient will increase gross right LE strength by 5 lbs  Baseline: Goal status: Pt met goal 4/22  2.  Patient will be increase knee extension passive by 3 degrees  Baseline:  Goal status: Pt met goal 4/22  3.  Patient will be independent with basic HEP  Baseline:  Goal status: Pt met goal 4/22  LONG TERM GOALS: Target date: 03/20/2023    Patient will stand for 30 min without pain in order to perform her ADL's  Baseline:  Goal status: Pt is progressing 4/22 updated to 30 min   2.  Patient will be independent with full gym program  Baseline:  Goal status: Pt is progressing 4/22  3.  Patient will ambulate without increased pain community distances  Baseline:  Goal status: Pt is progressing 4/22    PLAN: PT FREQUENCY: 2x/week  PT DURATION: 8 weeks  PLANNED INTERVENTIONS: Therapeutic exercises, Therapeutic activity, Neuromuscular re-education, Balance training, Gait training, Patient/Family education, Joint mobilization, Stair training, DME instructions, Aquatic Therapy, Dry Needling, Electrical stimulation, Cryotherapy, Moist heat, Ultrasound, and Manual therapy  PLAN FOR NEXT SESSION: continue with manual therapy to the knee; progress to standing exercises; progress back to gym activity as able.    Kitty Perkins, PT 07/08/2023, 1:15 PM

## 2023-07-09 ENCOUNTER — Encounter (HOSPITAL_BASED_OUTPATIENT_CLINIC_OR_DEPARTMENT_OTHER): Payer: Self-pay | Admitting: Physical Therapy

## 2023-07-09 ENCOUNTER — Encounter: Payer: Self-pay | Admitting: Adult Health

## 2023-07-09 ENCOUNTER — Telehealth: Admitting: Adult Health

## 2023-07-09 DIAGNOSIS — F4310 Post-traumatic stress disorder, unspecified: Secondary | ICD-10-CM

## 2023-07-09 DIAGNOSIS — F5105 Insomnia due to other mental disorder: Secondary | ICD-10-CM | POA: Diagnosis not present

## 2023-07-09 DIAGNOSIS — F99 Mental disorder, not otherwise specified: Secondary | ICD-10-CM

## 2023-07-09 DIAGNOSIS — F411 Generalized anxiety disorder: Secondary | ICD-10-CM | POA: Diagnosis not present

## 2023-07-09 DIAGNOSIS — F319 Bipolar disorder, unspecified: Secondary | ICD-10-CM

## 2023-07-09 NOTE — Progress Notes (Signed)
 Tiffany [name] [id number] 1950-07-07 73 y.o.  Virtual Visit via Video Note  I connected with pt @ on 07/09/23 at 11:00 AM EDT by a video enabled telemedicine application and verified that I am speaking with the correct person using two identifiers.   I discussed the limitations of evaluation and management by telemedicine and the availability of in person appointments. The patient expressed understanding and agreed to proceed.  I discussed the assessment and treatment plan with the patient. The patient was provided an opportunity to ask questions and all were answered. The patient agreed with the plan and demonstrated an understanding of the instructions.   The patient was advised to call back or seek an in-person evaluation if the symptoms worsen or if the condition fails to improve as anticipated.  I provided 30 minutes of non-face-to-face time during this encounter.  The patient was located at home.  The provider was located at [facility].   Reagan Camera, NP   Subjective:   Patient ID:  Tiffany [name] is a 73 y.o. (DOB 10/09/1950) female.  Chief Complaint: No chief complaint on file.   HPI Sashay Pinero Lamke presents for follow-up of PTSD, insomnia, GAD and BPD- 1.  Describes mood today as "ok". Pleasant. Reports tearfulness. Mood symptoms - reports a "slight" depression. Reports varying interest and motivation. Denies anxiety and irritability. Denies panic attacks. Reports some worry, rumination and over thinking. Denies obsessive thoughts. Denies feeling overwhelmed. Denies racing thoughts. Mood is stable. Stating "I feel like I'm doing ok". Taking medications as prescribed.  Energy levels good. Active, exercising.  Enjoys some usual interests and activities. Married. Lives with husband and daughter. Talking to family and friends.  Appetite adequate. Weight stable - 181 pounds. Sleeps well most nights. Averages 8 or more hours. Denies daytime napping. Reports focus and  concentration "the same". Completing tasks. Managing some aspects of household. Retired.  Reports passive SI - "it's manageable". Denies HI.  Denies AH or VH. Denies self harm. Denies substance use.  Previous medications: Celexa, Zyprexa, Tegretol, Depakote , Serzone, Topamax, Seroquel, Effexor, Lexapro, Desipramine, Neurontin, Abilify, Geodon, Propanolol, Cymbalta, Cogentin, Trihexyphenadyl, Sinmmet, Provigil, Selegiline, Requip, Amantadine, Prozac, Mirapex, Azilect, Metoclopramide, Baclofen, Artane, Namenda, Latuda , Lithium ,   Review of Systems:  Review of Systems  Musculoskeletal:  Negative for gait problem.  Neurological:  Negative for tremors.  Psychiatric/Behavioral:         Please refer to HPI    Medications: I have reviewed the patient's current medications.  Current Outpatient Medications  Medication Sig Dispense Refill   amLODipine  (NORVASC ) 5 MG tablet TAKE 1 TABLET(5 MG) BY MOUTH DAILY 90 tablet 1   atorvastatin  (LIPITOR) 10 MG tablet Take 0.5 tablets (5 mg total) by mouth daily. 45 tablet 3   augmented betamethasone dipropionate (DIPROLENE-AF) 0.05 % cream APPLY TO THE AFFECTED AREAS OF LEG TWICE DAILY AS NEEDED FOR RASH. NOT TO FACE, GROIN, UNDERARMS 50 g 0   cariprazine (VRAYLAR) 1.5 MG capsule every other day.     Cholecalciferol  50 MCG (2000 UT) TABS 1 tablet Orally Once a day for 30 day(s)     Golimumab (SIMPONI ARIA IV) Infusion every 8 weeks     ipratropium (ATROVENT ) 0.03 % nasal spray Place 2 sprays into both nostrils every 12 (twelve) hours. 30 mL 12   levothyroxine  (SYNTHROID ) 75 MCG tablet Take 1 tablet (75 mcg total) by mouth daily. 90 tablet 3   LORazepam  (ATIVAN ) 0.5 MG tablet Take 0.5 mg by mouth 3 (three) times  daily as needed.     metoprolol  succinate (TOPROL  XL) 25 MG 24 hr tablet Take 0.5 tablets (12.5 mg total) by mouth daily. 45 tablet 3   metroNIDAZOLE  (METROGEL ) 0.75 % gel Apply to face 1-2 times daily 45 g 1   mirtazapine (REMERON) 30 MG tablet  Take 30 mg by mouth at bedtime.     Omega-3 Fatty Acids (FISH OIL) 1200 MG CAPS Take 1 capsule by mouth in the morning and at bedtime.     Probiotic Product (ALIGN) 4 MG CAPS Take 1 capsule by mouth daily in the afternoon.     No current facility-administered medications for this visit.    Medication Side Effects: None  Allergies:  Allergies  Allergen Reactions   Aripiprazole Other (See Comments)    Parkinsonism      Lactose Intolerance (Gi) Diarrhea   Methotrexate Other (See Comments)    Hair loss, severe stomatitis     Nsaids     CKD 4   Cefdinir Diarrhea    Other reaction(s): Diarrhea Yeast infection and fever; negative c diff   Etanercept Other (See Comments)    Headaches     Exemestane Other (See Comments)    Suicidal thoughts with medication     Fluoxetine Other (See Comments)    Parkinsonism   Methylprednisolone  Sodium Succ Other (See Comments)    Agitated mania   Other Other (See Comments)    Paper tape causes bruising.   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 1.4 cm  Grade I invasive lobular, ER and PR +/Her--, Ki 67 <5% Tried anastrozole for one month, but developed suicidal idea   Osteoarthritis, knee 09/24/2019   Xray 09/2019   Parkinsonism (HCC)    Drug induced   Polyposis of colon    Psoriatic arthritis (HCC)    PTSD (post-traumatic stress disorder)    Secondary hyperparathyroidism (HCC)    Tardive dyskinesia    Thyroid  disease     Family History  Problem Relation Age of Onset    Lymphoma Mother 46   Lymphoma Sister 37   Breast cancer Sister 84   Colon polyps Sister    Lung cancer Sister        former smoker   Stroke Maternal Grandfather    Diabetes Paternal Grandfather    Colon cancer Neg Hx    Esophageal cancer Neg Hx    Stomach cancer Neg Hx    Rectal cancer Neg Hx     Social History   Socioeconomic History   Marital status: Married    Spouse name: Alm Jacks   Number of children: 3   Years of education: Not on file   Highest education level: Associate degree: occupational, Scientist, product/process development, or vocational program  Occupational History   Occupation: retired  Tobacco Use   Smoking status: Never   Smokeless tobacco: Never  Vaping Use   Vaping status: Never Used  Substance and Sexual Activity   Alcohol use:  Never   Drug use: Never   Sexual activity: Not Currently  Other Topics Concern   Not on file  Social History Narrative   Moved to area from [place]  08/2018   Lives one story home   Right handed.   Social Drivers of Corporate investment banker Strain: Low Risk  (04/24/2023)   Overall Financial Resource Strain (CARDIA)    Difficulty of Paying Living Expenses: Not hard at all  Food Insecurity: No Food Insecurity (04/24/2023)   Hunger Vital Sign    Worried About Running Out of Food in the Last Year: Never true    Ran Out of Food in the Last Year: Never true  Transportation Needs: No Transportation Needs (04/24/2023)   PRAPARE - Administrator, Civil Service (Medical): No    Lack of Transportation (Non-Medical): No  Physical Activity: Sufficiently Active (04/24/2023)   Exercise Vital Sign    Days of Exercise per Week: 3 days    Minutes of Exercise per Session: 50 min  Recent Concern: Physical Activity - Insufficiently Active (03/23/2023)   Exercise Vital Sign    Days of Exercise per Week: 3 days    Minutes of Exercise per Session: 40 min  Stress: No Stress Concern Present (04/24/2023)   [employer] of Occupational Health -  Occupational Stress Questionnaire    Feeling of Stress : Not at all  Social Connections: Moderately Integrated (04/24/2023)   Social Connection and Isolation Panel [NHANES]    Frequency of Communication with Friends and Family: More than three times a week    Frequency of Social Gatherings with Friends and Family: More than three times a week    Attends Religious Services: More than 4 times per year    Active Member of [employer] or Organizations: No    Attends Banker Meetings: Never    Marital Status: Married  Catering manager Violence: Not At Risk (04/24/2023)   Humiliation, Afraid, Rape, and Kick questionnaire    Fear of Current or Ex-Partner: No    Emotionally Abused: No    Physically Abused: No    Sexually Abused: No    Past Medical History, Surgical history, Social history, and Family history were reviewed and updated as appropriate.   Please see review of systems for further details on the patient's review from today.   Objective:   Physical Exam:  There were no vitals taken for this visit.  Physical Exam Constitutional:      General: She is not in acute distress. Musculoskeletal:        General: No deformity.  Neurological:     Mental Status: She is alert and oriented to person, place, and time.     Coordination: Coordination normal.  Psychiatric:        Attention and Perception: Attention and perception normal. She does not perceive auditory or visual hallucinations.        Mood and Affect: Mood normal. Affect is not labile, blunt, angry or inappropriate.        Speech: Speech normal.        Behavior: Behavior normal.        Thought Content: Thought content normal. Thought content is not paranoid or delusional. Thought content does not include homicidal or suicidal ideation. Thought content does not include homicidal or suicidal plan.        Cognition and Memory: Cognition and memory normal.        Judgment: Judgment normal.     Comments: Insight  intact      Lab Review:     Component Value Date/Time   NA 141 07/12/2022 1108   NA 142 10/17/2021 0000   K 4.3 07/12/2022 1108   CL 105 07/12/2022 1108   CO2 26 07/12/2022 1108   GLUCOSE 69 (L) 07/12/2022 1108   BUN 32 (H) 07/12/2022 1108   BUN 30 (A) 10/17/2021 0000   CREATININE 2.27 (H) 07/12/2022 1108   CREATININE 2.29 (H) 05/22/2021 1044   CALCIUM  8.9 07/19/2022 1100   PROT 7.1 07/15/2022 1515   ALBUMIN 4.4 07/15/2022 1515   AST 25 07/15/2022 1515   ALT 29 07/15/2022 1515   ALKPHOS 138 (H) 07/15/2022 1515   BILITOT 0.9 07/15/2022 1515   GFRNONAA 22 (L) 04/03/2022 1425   GFRAA 25 (L) 09/13/2019 1243       Component Value Date/Time   WBC 7.7 07/12/2022 1108   RBC 4.18 07/12/2022 1108   HGB 12.9 07/12/2022 1108   HCT 38.6 07/12/2022 1108   PLT 201.0 07/12/2022 1108   MCV 92.3 07/12/2022 1108   MCH 30.5 04/03/2022 1425   MCHC 33.4 07/12/2022 1108   RDW 13.5 07/12/2022 1108   LYMPHSABS 3.4 07/12/2022 1108   MONOABS 0.9 07/12/2022 1108   EOSABS 0.3 07/12/2022 1108   BASOSABS 0.1 07/12/2022 1108    Lithium  Lvl  Date Value Ref Range Status  05/22/2021 0.7 0.6 - 1.2 mmol/L Final     No results found for: "PHENYTOIN", "PHENOBARB", "VALPROATE", "CBMZ"   .res Assessment: Plan:    Plan:  Continue: Remeron 30mg  hs - concerned about GFR Vraylar 1.5mg  every other day Lorazepam  0.5mg  TID - only taking one tablet at bedtime.  30 minutes spent dedicated to the care of this patient on the date of this encounter to include pre-visit review of records, ordering of medication, post visit documentation, and face-to-face time with the patient discussing PTSD, insomnia, GAD and BPD- 1.. Discussed continuing current medication regimen.  Discussed potential benefits, risk, and side effects of benzodiazepines to include potential risk of tolerance and dependence, as well as possible drowsiness.  Advised patient not to drive if experiencing drowsiness and to take lowest possible  effective dose to minimize risk of dependence and tolerance.  Discussed potential benefits, risks, and side effects of stimulants with patient to include increased heart rate, palpitations, insomnia, increased anxiety, increased irritability, or decreased appetite.  Instructed patient to contact office if experiencing any significant tolerability issues.      There are no diagnoses linked to this encounter.   Please see After Visit Summary for patient specific instructions.  Future Appointments  Date Time Provider Department Center  07/15/2023 11:45 AM Kitty Perkins, PT [facility]  08/26/2023 11:15 AM [facility] None  09/24/2023  1:00 PM Preciliano Castell Nattalie, NP [facility] None  10/24/2023 11:30 AM Shamleffer, Julian Obey, MD [facility] None  05/03/2024  1:40 PM [facility] ANNUAL WELLNESS VISIT 1 [facility]    No orders of the defined types were placed in this encounter.     -------------------------------

## 2023-07-15 ENCOUNTER — Encounter (HOSPITAL_BASED_OUTPATIENT_CLINIC_OR_DEPARTMENT_OTHER): Payer: Self-pay | Admitting: Physical Therapy

## 2023-07-15 ENCOUNTER — Ambulatory Visit (HOSPITAL_BASED_OUTPATIENT_CLINIC_OR_DEPARTMENT_OTHER): Attending: Physician Assistant | Admitting: Physical Therapy

## 2023-07-15 DIAGNOSIS — R2689 Other abnormalities of gait and mobility: Secondary | ICD-10-CM | POA: Insufficient documentation

## 2023-07-15 DIAGNOSIS — G8929 Other chronic pain: Secondary | ICD-10-CM | POA: Insufficient documentation

## 2023-07-15 DIAGNOSIS — R2681 Unsteadiness on feet: Secondary | ICD-10-CM | POA: Insufficient documentation

## 2023-07-15 DIAGNOSIS — M25552 Pain in left hip: Secondary | ICD-10-CM | POA: Insufficient documentation

## 2023-07-15 DIAGNOSIS — M25561 Pain in right knee: Secondary | ICD-10-CM | POA: Insufficient documentation

## 2023-07-15 DIAGNOSIS — M25652 Stiffness of left hip, not elsewhere classified: Secondary | ICD-10-CM | POA: Diagnosis present

## 2023-07-15 DIAGNOSIS — M6281 Muscle weakness (generalized): Secondary | ICD-10-CM | POA: Diagnosis present

## 2023-07-15 NOTE — Therapy (Signed)
 OUTPATIENT PHYSICAL THERAPY LOWER EXTREMITY Progress Note/Discharge    Patient Name: Tiffany [name] MRN: [id number] DOB:[birth year], 73 y.o., female Today's Date: 07/15/2023   PT End of Session - 07/15/23 1150     Visit Number 19    Number of Visits 23    Date for PT Re-Evaluation 08/12/23    Authorization Type progress note done at visit 17    PT Start Time 1147    PT Stop Time 1229    PT Time Calculation (min) 42 min    Activity Tolerance Patient tolerated treatment well;No increased pain    Behavior During Therapy WFL for tasks assessed/performed                  Past Medical History:  Diagnosis Date   Bipolar 1 disorder (HCC)    Chronic diarrhea    loose stools twice a day on average for years   CKD stage G4/A1, 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 1.4 cm  Grade I invasive lobular, ER and PR +/Her--, Ki 67 <5% Tried anastrozole for one month, but developed suicidal idea   Osteoarthritis, knee 09/24/2019   Xray 09/2019   Parkinsonism (HCC)    Drug induced   Polyposis of colon    Psoriatic arthritis (HCC)    PTSD (post-traumatic stress disorder)    Secondary hyperparathyroidism (HCC)    Tardive dyskinesia    Thyroid  disease    Past Surgical History:  Procedure Laterality Date   ABDOMINAL HYSTERECTOMY  1987   CHOLECYSTECTOMY  1979   DILATION AND CURETTAGE OF UTERUS  1973   MASTECTOMY Bilateral 09/19/2014   TONSILLECTOMY  1970   URETERAL REIMPLANTION Bilateral 1974   Patient Active Problem List   Diagnosis Date Noted   Raynaud's phenomenon without gangrene 03/26/2023   Family history of breast cancer 06/05/2021   Osteoarthritis, knee 09/24/2019   CKD (chronic kidney disease) stage 4, GFR 15-29 ml/min (HCC) 09/24/2019   Severe recurrent major  depression without psychotic features (HCC) 09/09/2019   Bipolar I disorder, most recent episode depressed (HCC)    Transaminitis    Encephalopathy 04/26/2019   Leukocytosis 04/21/2019   Thrombocytopenia (HCC) 09/04/2018   Vitamin D  deficiency 09/04/2018   Psoriatic arthritis (HCC) 09/04/2018   Tubular adenoma of colon 05/28/2017   History of colonic polyps 05/28/2017   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      PCP: Alexander Iba, PA  REFERRING PROVIDER: Alexander Iba, PA  REFERRING DIAG:  [phone] - recurrent pain of right knee  THERAPY DIAG:  Other abnormalities of gait and mobility  Muscle weakness (generalized)  Chronic pain of right knee  Stiffness of left hip, not elsewhere classified  Pain in left hip  Unsteadiness on feet  Rationale for Evaluation and Treatment Rehabilitation  ONSET DATE: 3 weeks ago  SUBJECTIVE:  SUBJECTIVE STATEMENT: 5/6 Pt reports no negative changes since last visit.    PERTINENT HISTORY: Bi-polar, CKD, depression, breast cancer, OA, Parkinson's, Psoriatic Arthritis, PTSD, tardive diskenesia,   PAIN:  Are you having pain? Yes: NPRS scale: 3.5/10 pain  Pain location:IT band  Pain description: aching Aggravating factors: standing, walking , twisting Relieving factors: rest  PRECAUTIONS: None  WEIGHT BEARING RESTRICTIONS No  FALLS:  Has patient fallen in last 6 months? No  LIVING ENVIRONMENT: Lives with  husband  OCCUPATION: retired   PLOF: Independent has not had to use a walker  PATIENT GOALS   To have less pain in her knee and to get back into the gym   OBJECTIVE:   DIAGNOSTIC FINDINGS:  Nothing recent   PATIENT SURVEYS:   COGNITION:  Overall cognitive status: Within functional limits for tasks assessed     SENSATION: WFL   POSTURE: rounded shoulders and forward head  PALPATION: Trigger points in lower IT band   LOWER EXTREMITY ROM:  Active PROM Right eval Left eval Right  2/12  Hip flexion     Hip extension     Hip abduction     Hip adduction     Hip internal rotation     Hip external rotation     Knee flexion     Knee extension -7  -5  Ankle dorsiflexion     Ankle plantarflexion     Ankle inversion     Ankle eversion      (Blank rows = not tested)  LOWER EXTREMITY MMT:  MMT Right eval Left eval Right  2/12 Left 2/12  Right 4/22 Left 4/22  Hip flexion 17.7  17.8 28.5 38.8 38 38.8  Hip extension        Hip abduction 11.1 20.4 32.3 40.4 48.1 44.5  Hip adduction        Hip internal rotation        Hip external rotation        Knee flexion        Knee extension 18.8 17.3 38.5 51.8 44.9 49.3  Ankle dorsiflexion        Ankle plantarflexion        Ankle inversion        Ankle eversion         (Blank rows = not tested)   GAIT: 4/22 Similar stance time bilaterally   TODAY'S TREATMENT: 5/6  There-ex: Leg press 3x10 55lbs Seated hip abduction 3x10 65lbs Rows 3x10 30lbs Tricep push down v bar 3x10 15lbs Leg ext machine 3x10 15lbs Nu-step: reviewed set up for self nu-step    4/29 There-ex:  Nu-step warm up lvl 4 Hamstring stretch 3x20 sec hold  Leg press 55 lbs cybex 3x10  Hip abduction 55 lbs 3x10   Nuero-re-ed:  Row 3x10 15 lbs  Shoulder extension 3x10 15 lbs  Triceps press down 3x10 40 lbs       4/22 There-ex: Nu-step warm up lvl 4 Strength testing for re-eval There-Act Rope  hammer curls 3x10 5lbs Neuro-Re-ed  Row machine 3x10 20lbs RPE 5  Step up onto air ex 2x10 (felt twinge in back of knee on last rep of 2nd set)  Standing rope pullovers 3x10 5lbs   4/15 There-ex: LTR 3x10 each side (pt really enjoys these) Glute stretch 3x30sec each side HEP review Hip abduction machine 40 lbs 3x15  Knee extension machine 10 lbs   Neuro-re-ed:  Row machine 3x12  25 lbs  Press down with posture 3x12 30 lbs   4/7 There-ex: LTR 3x10 each side (pt really enjoys these) Glute stretch 3x30sec each side HEP review   Neuro-Re-ed Air ex step on step off 2x20 Air air L and R upper body rotn for balance with movement 2x5 on air ex, 1x5 on flat surface Step ups 6in 2x10 Step downs 6in 2x10     PATIENT EDUCATION:  Education details: reviewed HEP; symptom management, activity progression  Person educated: Patient Education method: Explanation, Demonstration, Tactile cues, Verbal cues, and Handouts Education comprehension: verbalized understanding, returned demonstration, verbal cues required, tactile cues required, and needs further education   HOME EXERCISE PROGRAM: Access Code: [email or web address] URL: [email or web address] Date: 01/31/2023 Prepared by: Signa Drier  Exercises - Supine Heel Slide with Strap  - 1 x daily - 7 x weekly - 3 sets - 5 reps - 5 sec  hold - Supine Quad Set  - 1 x daily - 7 x weekly - 3 sets - 10 reps - Supine Knee Extension Stretch on Towel Roll  - 1 x daily - 7 x weekly - 3 sets - 5 reps - 5 sec  hold  ASSESSMENT:  CLINICAL IMPRESSION: 5/6 Therapy today focused on gym activities in preparation for discharge. All exercises were done with proper muscle activation and body positioning. Pt reported feeling a lot more comfortable with gym activities. Pt also reported since coming to physical therapy feeling a lot stronger and comfortable with her ADL's and recreational activities. She feels more  confident playing with her grandkids and doing things outdoors. Pt has benefited significantly from skilled physical therapy. See below for goal specific progress.    Eval:  Patient is a 73 y.o. female who was seen today for physical therapy evaluation and treatment for right knee pain. She has had improved knee pain and mobility in the past with PT. She returns today with an acute exacerbation of her knee pain.  She continues on limited knee extension but no significant decline from her previous episode.  She has decreased balance with narrow base with eyes closed.  She is working on her pool program on her own.  She would benefit from skilled therapy to improve balance and continue to work on knee extension. OBJECTIVE IMPAIRMENTS Abnormal gait, decreased activity tolerance, decreased endurance, decreased mobility, difficulty walking, decreased ROM, decreased strength, increased fascial restrictions, and pain.   ACTIVITY LIMITATIONS carrying, bending, standing, squatting, stairs, transfers, and locomotion level  PARTICIPATION LIMITATIONS: cleaning, laundry, shopping, community activity, and yard work  PERSONAL FACTORS 3+ comorbidities: Psoriatic arthritis, Bi-polar , Parkinsons  are also affecting patient's functional outcome.   REHAB POTENTIAL: Good  CLINICAL DECISION MAKING: Evolving/moderate complexity  EVALUATION COMPLEXITY: Moderate   GOALS: Goals reviewed with patient? Yes  SHORT TERM GOALS: Target date: 05/02/2023     Patient will increase gross right LE strength by 5 lbs  Baseline: Goal status: Pt met goal 4/22  2.  Patient will be increase knee extension passive by 3 degrees  Baseline:  Goal status: Pt met goal 4/22  3.  Patient will be independent with basic HEP  Baseline:  Goal status: Pt met goal 4/22  LONG TERM GOALS: Target date: 03/20/2023    Patient will stand for 30 min without pain in order to perform her ADL's  Baseline:  Goal status: csn stand for 30  min 5/6  2.  Patient will be independent with full gym program  Baseline:  Goal  status: has full program 5/6  3.  Patient will ambulate without increased pain community distances  Baseline:  Goal status: ambulating with minimal pain 5/6    PLAN: PT FREQUENCY: 2x/week  PT DURATION: 8 weeks  PLANNED INTERVENTIONS: Therapeutic exercises, Therapeutic activity, Neuromuscular re-education, Balance training, Gait training, Patient/Family education, Joint mobilization, Stair training, DME instructions, Aquatic Therapy, Dry Needling, Electrical stimulation, Cryotherapy, Moist heat, Ultrasound, and Manual therapy  PLAN FOR NEXT SESSION: continue with manual therapy to the knee; progress to standing exercises; progress back to gym activity as able.    Katheran Palms, Student-PT 07/15/2023, 1:36 PM   Signa Drier PT  I have reviewed and concur with this student's documentation.   Kitty Perkins, PT 07/15/2023 4:33 PM   During this treatment session, the therapist was present, participating in and directing the treatment.

## 2023-07-17 ENCOUNTER — Encounter: Payer: Self-pay | Admitting: Neurology

## 2023-07-22 ENCOUNTER — Ambulatory Visit: Admitting: Adult Health

## 2023-07-22 ENCOUNTER — Encounter: Payer: Self-pay | Admitting: Adult Health

## 2023-07-22 DIAGNOSIS — F319 Bipolar disorder, unspecified: Secondary | ICD-10-CM | POA: Diagnosis not present

## 2023-07-22 DIAGNOSIS — F4310 Post-traumatic stress disorder, unspecified: Secondary | ICD-10-CM

## 2023-07-22 DIAGNOSIS — F411 Generalized anxiety disorder: Secondary | ICD-10-CM

## 2023-07-22 DIAGNOSIS — Z79899 Other long term (current) drug therapy: Secondary | ICD-10-CM | POA: Diagnosis not present

## 2023-07-22 DIAGNOSIS — F5105 Insomnia due to other mental disorder: Secondary | ICD-10-CM

## 2023-07-22 DIAGNOSIS — F99 Mental disorder, not otherwise specified: Secondary | ICD-10-CM

## 2023-07-22 NOTE — Progress Notes (Signed)
 Tiffany [name] [id number] Oct 19, 1950 73 y.o.  Subjective:   Patient ID:  Tiffany [name] is a 73 y.o. (DOB [birth year]) female.  Chief Complaint: No chief complaint on file.   HPI Tiffany [name] presents to the office today for follow-up of PTSD, insomnia, GAD and BPD- 1.  Describes mood today as "ok". Pleasant. Reports tearfulness. Mood symptoms - reports a "low level" depression. Reports improved interest and motivation. Reports situational anxiety Denies irritability. Denies panic attacks. Reports some worry, rumination and over thinking. Denies obsessive thoughts. Denies racing thoughts. Reports mood is stable. Stating "I feel like I'm doing ok". Taking medications as prescribed.  Energy levels good. Active, exercising.  Enjoys some usual interests and activities. Married. Lives with husband and daughter. Talking to family and friends.  Appetite adequate. Weight loss - 175 to 181 pounds. Sleeps well most nights. Averages 8 or more hours. Denies daytime napping. Reports focus and concentration difficulties "my normal". Completing tasks. Managing some aspects of household. Retired.  Reports passive SI - "seldom" Denies HI.  Denies AH or VH. Denies self harm. Denies substance use.  Previous medications: Celexa, Zyprexa, Tegretol, Depakote , Serzone, Topamax, Seroquel, Effexor, Lexapro, Desipramine, Neurontin, Abilify, Geodon, Propanolol, Cymbalta, Cogentin, Trihexyphenadyl, Sinmmet, Provigil, Selegiline, Requip, Amantadine, Prozac, Mirapex, Azilect, Metoclopramide, Baclofen, Artane, Namenda, Latuda , Lithium ,    GAD-7    Flowsheet Row Office Visit from 12/30/2022 in [facility] [employer] from 10/29/2022 in [facility] [employer] [facility] [employer] Visit from 05/06/2022 in [facility] [employer] from 04/25/2022 in [facility] [place] [facility] [employer] Visit from 04/10/2022 in [facility]  [place] [facility] [place]  Total GAD-7 Score 0 16 4 9 17       Mini-Mental    Flowsheet Row Office Visit from 06/07/2019 in [facility]  Total Score (max 30 points ) 29      PHQ2-9    Flowsheet Row Clinical Support from 04/24/2023 in [facility] [place] [facility] [employer] Visit from 12/30/2022 in [facility] [place] [facility] [employer] Visit from 10/29/2022 in [facility] [place] [facility] [employer] Visit from 07/15/2022 in [facility] [place] [facility] [employer] Visit from 05/06/2022 in [facility] [place] [facility] [place]  PHQ-2 Total Score 0 0 2 3 0  PHQ-9 Total Score 0 -- 13 15 0      Flowsheet Row Clinical Support from 04/15/2022 in [facility] [place] [facility] from 04/03/2022 in [facility] Department at [facility] ED from 11/26/2021 in [facility] Emergency Department at [facility]  C-SSRS RISK CATEGORY No Risk No Risk No Risk        Review of Systems:  Review of Systems  Medications: I have reviewed the patient's current medications.  Current Outpatient Medications  Medication Sig Dispense Refill   amLODipine  (NORVASC ) 5 MG tablet TAKE 1 TABLET(5 MG) BY MOUTH DAILY 90 tablet 1   atorvastatin  (LIPITOR) 10 MG tablet Take 0.5 tablets (5 mg total) by mouth daily. 45 tablet 3   augmented betamethasone dipropionate (DIPROLENE-AF) 0.05 % cream APPLY TO THE AFFECTED AREAS OF LEG TWICE DAILY AS NEEDED FOR RASH. NOT TO FACE, GROIN, UNDERARMS 50 g 0   cariprazine (VRAYLAR) 1.5 MG capsule every other day.     Cholecalciferol  50 MCG (2000 UT) TABS  1 tablet Orally Once a day for 30 day(s)     Golimumab (SIMPONI ARIA IV) Infusion every 8 weeks     ipratropium (ATROVENT ) 0.03 % nasal spray Place 2 sprays into both nostrils every 12 (twelve) hours. 30 mL 12   levothyroxine  (SYNTHROID ) 75 MCG tablet Take 1 tablet (75 mcg total) by mouth  daily. 90 tablet 3   LORazepam  (ATIVAN ) 0.5 MG tablet Take 0.5 mg by mouth 3 (three) times daily as needed.     metoprolol  succinate (TOPROL  XL) 25 MG 24 hr tablet Take 0.5 tablets (12.5 mg total) by mouth daily. 45 tablet 3   metroNIDAZOLE  (METROGEL ) 0.75 % gel Apply to face 1-2 times daily 45 g 1   mirtazapine (REMERON) 30 MG tablet Take 30 mg by mouth at bedtime.     Omega-3 Fatty Acids (FISH OIL) 1200 MG CAPS Take 1 capsule by mouth in the morning and at bedtime.     Probiotic Product (ALIGN) 4 MG CAPS Take 1 capsule by mouth daily in the afternoon.     No current facility-administered medications for this visit.    Medication Side Effects: None  Allergies:  Allergies  Allergen Reactions   Aripiprazole Other (See Comments)    Parkinsonism      Lactose Intolerance (Gi) Diarrhea   Methotrexate Other (See Comments)    Hair loss, severe stomatitis     Nsaids     CKD 4   Cefdinir Diarrhea    Other reaction(s): Diarrhea Yeast infection and fever; negative c diff   Etanercept Other (See Comments)    Headaches     Exemestane Other (See Comments)    Suicidal thoughts with medication     Fluoxetine Other (See Comments)    Parkinsonism   Methylprednisolone  Sodium Succ Other (See Comments)    Agitated mania   Other Other (See Comments)    Paper tape causes bruising.   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 1.4 cm  Grade I invasive lobular, ER and PR +/Her--, Ki 67 <5% Tried anastrozole for one month, but  developed suicidal idea   Osteoarthritis, knee 09/24/2019   Xray 09/2019   Parkinsonism (HCC)    Drug induced   Polyposis of colon    Psoriatic arthritis (HCC)    PTSD (post-traumatic stress disorder)    Secondary hyperparathyroidism (HCC)    Tardive dyskinesia    Thyroid  disease     Past Medical History, Surgical history, Social history, and Family history were reviewed and updated as appropriate.   Please see review of systems for further details on the patient's review from today.   Objective:   Physical Exam:  There were no vitals taken for this visit.  Physical Exam  Lab Review:     Component Value Date/Time   NA 141 07/12/2022 1108   NA 142 10/17/2021 0000   K 4.3 07/12/2022 1108   CL 105 07/12/2022 1108   CO2 26 07/12/2022  1108   GLUCOSE 69 (L) 07/12/2022 1108   BUN 32 (H) 07/12/2022 1108   BUN 30 (A) 10/17/2021 0000   CREATININE 2.27 (H) 07/12/2022 1108   CREATININE 2.29 (H) 05/22/2021 1044   CALCIUM  8.9 07/19/2022 1100   PROT 7.1 07/15/2022 1515   ALBUMIN 4.4 07/15/2022 1515   AST 25 07/15/2022 1515   ALT 29 07/15/2022 1515   ALKPHOS 138 (H) 07/15/2022 1515   BILITOT 0.9 07/15/2022 1515   GFRNONAA 22 (L) 04/03/2022 1425   GFRAA 25 (L) 09/13/2019 1243       Component Value Date/Time   WBC 7.7 07/12/2022 1108   RBC 4.18 07/12/2022 1108   HGB 12.9 07/12/2022 1108   HCT 38.6 07/12/2022 1108   PLT 201.0 07/12/2022 1108   MCV 92.3 07/12/2022 1108   MCH 30.5 04/03/2022 1425   MCHC 33.4 07/12/2022 1108   RDW 13.5 07/12/2022 1108   LYMPHSABS 3.4 07/12/2022 1108   MONOABS 0.9 07/12/2022 1108   EOSABS 0.3 07/12/2022 1108   BASOSABS 0.1 07/12/2022 1108    Lithium  Lvl  Date Value Ref Range Status  05/22/2021 0.7 0.6 - 1.2 mmol/L Final     No results found for: "PHENYTOIN", "PHENOBARB", "VALPROATE", "CBMZ"   .res Assessment: Plan:    Plan:  Continue: Remeron 30mg  hs - concerned about GFR - would like an updated BMP. Vraylar 1.5mg  every other  day Lorazepam  0.5mg  TID - only taking one tablet at bedtime.  30 minutes spent dedicated to the care of this patient on the date of this encounter to include pre-visit review of records, ordering of medication, post visit documentation, and face-to-face time with the patient discussing PTSD, insomnia, GAD and BPD- 1. Discussed continuing current medication regimen.  Diagnoses and all orders for this visit:  High risk medication use -     Basic metabolic panel     Please see After Visit Summary for patient specific instructions.  Future Appointments  Date Time Provider Department Center  08/26/2023 11:15 AM [facility] ENDO/NEURO LAB [facility] None  08/28/2023  3:30 PM Wilma Michaelson Nattalie, NP [facility] None  09/24/2023  1:00 PM Haydyn Liddell Nattalie, NP CP[facility] None  10/24/2023 11:30 AM Shamleffer, Julian Obey, MD [facility] None  05/03/2024  1:40 PM [facility] ANNUAL WELLNESS VISIT 1 [facility] P[facility]    Orders Placed This Encounter  Procedures   Basic metabolic panel    -------------------------------

## 2023-07-24 LAB — BASIC METABOLIC PANEL WITH GFR
BUN/Creatinine Ratio: 13 (ref 12–28)
BUN: 31 mg/dL — ABNORMAL HIGH (ref 8–27)
CO2: 18 mmol/L — ABNORMAL LOW (ref 20–29)
Calcium: 9 mg/dL (ref 8.7–10.3)
Chloride: 104 mmol/L (ref 96–106)
Creatinine, Ser: 2.43 mg/dL — ABNORMAL HIGH (ref 0.57–1.00)
Glucose: 83 mg/dL (ref 70–99)
Potassium: 4.1 mmol/L (ref 3.5–5.2)
Sodium: 139 mmol/L (ref 134–144)
eGFR: 20 mL/min/{1.73_m2} — ABNORMAL LOW (ref >59)

## 2023-08-15 ENCOUNTER — Ambulatory Visit: Payer: Medicare Other | Admitting: Neurology

## 2023-08-26 ENCOUNTER — Other Ambulatory Visit

## 2023-08-26 ENCOUNTER — Other Ambulatory Visit: Payer: Self-pay | Admitting: Internal Medicine

## 2023-08-26 DIAGNOSIS — E039 Hypothyroidism, unspecified: Secondary | ICD-10-CM

## 2023-08-27 ENCOUNTER — Ambulatory Visit: Payer: Self-pay | Admitting: Internal Medicine

## 2023-08-27 LAB — T4, FREE: Free T4: 1.6 ng/dL (ref 0.8–1.8)

## 2023-08-27 LAB — TSH: TSH: 1.66 m[IU]/L (ref 0.40–4.50)

## 2023-08-28 ENCOUNTER — Ambulatory Visit: Admitting: Adult Health

## 2023-08-28 ENCOUNTER — Encounter: Payer: Self-pay | Admitting: Adult Health

## 2023-08-28 DIAGNOSIS — F411 Generalized anxiety disorder: Secondary | ICD-10-CM | POA: Diagnosis not present

## 2023-08-28 DIAGNOSIS — F5105 Insomnia due to other mental disorder: Secondary | ICD-10-CM | POA: Diagnosis not present

## 2023-08-28 DIAGNOSIS — F4310 Post-traumatic stress disorder, unspecified: Secondary | ICD-10-CM | POA: Diagnosis not present

## 2023-08-28 DIAGNOSIS — F99 Mental disorder, not otherwise specified: Secondary | ICD-10-CM

## 2023-08-28 DIAGNOSIS — Z79899 Other long term (current) drug therapy: Secondary | ICD-10-CM

## 2023-08-28 DIAGNOSIS — F319 Bipolar disorder, unspecified: Secondary | ICD-10-CM

## 2023-08-28 MED ORDER — MIRTAZAPINE 30 MG PO TABS: 30.0000 mg | ORAL_TABLET | Freq: Every day | ORAL | 0 refills | Status: DC

## 2023-08-28 NOTE — Progress Notes (Signed)
 Kinsie Belford Soden [id number] Apr 07, 1950 73 y.o.  Subjective:   Patient ID:  KENNADIE BRENNER is a 73 y.o. (DOB 05/09/1950) female.  Chief Complaint: No chief complaint on file.   HPI Amonie Wisser Haymer presents to the office today for follow-up of PTSD, insomnia, GAD and BPD- 1.  Describes mood today as ok. Pleasant. Denies tearfulness. Mood symptoms - denies depression, anxiety or irritability. Reports improved interest and motivation. Denies panic attacks. Reports some worry, rumination and over thinking. Denies obsessive thoughts. Denies racing thoughts. Reports mood is stable. Stating I feel like I'm doing alright. Taking medications as prescribed.  Energy levels improved. Active, exercising.  Enjoys some usual interests and activities. Married. Lives with husband and daughter. Talking to family and friends.  Appetite adequate. Weight loss - 170 pounds. Sleeps well most nights. Averages 8 or more hours. Denies daytime napping. Reports focus and concentration improved it's better. Completing tasks. Managing some aspects of household. Retired.  Denis SI. Denies HI.  Denies AH or VH. Denies self harm. Denies substance use.  Previous medications: Celexa, Zyprexa, Tegretol, Depakote , Serzone, Topamax, Seroquel, Effexor, Lexapro, Desipramine, Neurontin, Abilify, Geodon, Propanolol, Cymbalta, Cogentin, Trihexyphenadyl, Sinmmet, Provigil, Selegiline, Requip, Amantadine, Prozac, Mirapex, Azilect, Metoclopramide, Baclofen, Artane, Namenda, Latuda , Lithium ,     GAD-7    Flowsheet Row Office Visit from 12/30/2022 in [facility] [place] [facility] [employer] from 10/29/2022 in [facility] [employer] [facility] [employer] Visit from 05/06/2022 in [facility] [place] [facility] [employer] from 04/25/2022 in [facility] [place] [facility] [employer] Visit from 04/10/2022 in [facility] [place] [facility] [place]  Total GAD-7 Score  0 16 4 9 17    Mini-Mental    Flowsheet Row Office Visit from 06/07/2019 in [facility]  Total Score (max 30 points ) 29   PHQ2-9    Flowsheet Row Clinical Support from 04/24/2023 in [facility] [employer] Visit from 12/30/2022 in [facility] [place] [facility] [employer] Visit from 10/29/2022 in [facility] [place] [facility] [employer] Visit from 07/15/2022 in [facility] [place] [facility] [employer] Visit from 05/06/2022 in [facility] [place] [facility] [place]  PHQ-2 Total Score 0 0 2 3 0  PHQ-9 Total Score 0 -- 13 15 0   Flowsheet Row Clinical Support from 04/15/2022 in [facility] [place] [facility] [place] ED from 04/03/2022 in [facility] Emergency Department at [facility] ED from 11/26/2021 in [facility] at [facility]  C-SSRS RISK CATEGORY No Risk No Risk No Risk     Review of Systems:  Review of Systems  Musculoskeletal:  Negative for gait problem.  Neurological:  Negative for tremors.  Psychiatric/Behavioral:         Please refer to HPI    Medications: I have reviewed the patient's current medications.  Current Outpatient Medications  Medication Sig Dispense Refill   amLODipine  (NORVASC ) 5 MG tablet TAKE 1 TABLET(5 MG) BY MOUTH DAILY 90 tablet 1   atorvastatin  (LIPITOR) 10 MG tablet Take 0.5 tablets (5 mg total) by mouth daily. 45 tablet 3   augmented betamethasone dipropionate (DIPROLENE-AF) 0.05 % cream APPLY TO THE AFFECTED AREAS OF LEG TWICE DAILY AS NEEDED FOR RASH. NOT TO FACE, GROIN, UNDERARMS 50 g 0   cariprazine (VRAYLAR) 1.5 MG capsule every other day.  Cholecalciferol  50 MCG (2000 UT) TABS 1 tablet Orally Once a day for 30 day(s)     Golimumab (SIMPONI ARIA IV) Infusion every 8 weeks     ipratropium (ATROVENT ) 0.03 % nasal spray Place 2 sprays into both nostrils every 12 (twelve) hours. 30 mL 12   levothyroxine   (SYNTHROID ) 75 MCG tablet Take 1 tablet (75 mcg total) by mouth daily. 90 tablet 3   LORazepam  (ATIVAN ) 0.5 MG tablet Take 0.5 mg by mouth 3 (three) times daily as needed.     metoprolol  succinate (TOPROL  XL) 25 MG 24 hr tablet Take 0.5 tablets (12.5 mg total) by mouth daily. 45 tablet 3   metroNIDAZOLE  (METROGEL ) 0.75 % gel Apply to face 1-2 times daily 45 g 1   mirtazapine (REMERON) 30 MG tablet Take 1 tablet (30 mg total) by mouth at bedtime. 90 tablet 0   Omega-3 Fatty Acids (FISH OIL) 1200 MG CAPS Take 1 capsule by mouth in the morning and at bedtime.     Probiotic Product (ALIGN) 4 MG CAPS Take 1 capsule by mouth daily in the afternoon.     No current facility-administered medications for this visit.    Medication Side Effects: None  Allergies:  Allergies  Allergen Reactions   Aripiprazole Other (See Comments)    Parkinsonism      Lactose Intolerance (Gi) Diarrhea   Methotrexate Other (See Comments)    Hair loss, severe stomatitis     Nsaids     CKD 4   Cefdinir Diarrhea    Other reaction(s): Diarrhea Yeast infection and fever; negative c diff   Etanercept Other (See Comments)    Headaches     Exemestane Other (See Comments)    Suicidal thoughts with medication     Fluoxetine Other (See Comments)    Parkinsonism   Methylprednisolone  Sodium Succ Other (See Comments)    Agitated mania   Other Other (See Comments)    Paper tape causes bruising.   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 1.4 cm  Grade  I invasive lobular, ER and PR +/Her--, Ki 67 <5% Tried anastrozole for one month, but developed suicidal idea   Osteoarthritis, knee 09/24/2019   Xray 09/2019   Parkinsonism (HCC)    Drug induced   Polyposis of colon    Psoriatic arthritis (HCC)    PTSD (post-traumatic stress disorder)    Secondary hyperparathyroidism (HCC)    Tardive dyskinesia    Thyroid  disease     Past Medical History, Surgical history, Social history, and Family history were reviewed and updated as appropriate.   Please see review of systems for further details on the patient's review from today.   Objective:   Physical Exam:  There were no vitals taken for this visit.  Physical Exam Constitutional:      General: She is not in acute distress.  Musculoskeletal:        General: No deformity.   Neurological:  Mental Status: She is alert and oriented to person, place, and time.     Coordination: Coordination normal.   Psychiatric:        Attention and Perception: Attention and perception normal. She does not perceive auditory or visual hallucinations.        Mood and Affect: Mood normal. Mood is not anxious or depressed. Affect is not labile, blunt, angry or inappropriate.        Speech: Speech normal.        Behavior: Behavior normal.        Thought Content: Thought content normal. Thought content is not paranoid or delusional. Thought content does not include homicidal or suicidal ideation. Thought content does not include homicidal or suicidal plan.        Cognition and Memory: Cognition and memory normal.        Judgment: Judgment normal.     Comments: Insight intact     Lab Review:     Component Value Date/Time   NA 139 07/23/2023 1035   K 4.1 07/23/2023 1035   CL 104 07/23/2023 1035   CO2 18 (L) 07/23/2023 1035   GLUCOSE 83 07/23/2023 1035   GLUCOSE 69 (L) 07/12/2022 1108   BUN 31 (H) 07/23/2023 1035   CREATININE 2.43 (H) 07/23/2023 1035   CREATININE 2.29 (H) 05/22/2021 1044   CALCIUM   9.0 07/23/2023 1035   PROT 7.1 07/15/2022 1515   ALBUMIN 4.4 07/15/2022 1515   AST 25 07/15/2022 1515   ALT 29 07/15/2022 1515   ALKPHOS 138 (H) 07/15/2022 1515   BILITOT 0.9 07/15/2022 1515   GFRNONAA 22 (L) 04/03/2022 1425   GFRAA 25 (L) 09/13/2019 1243       Component Value Date/Time   WBC 7.7 07/12/2022 1108   RBC 4.18 07/12/2022 1108   HGB 12.9 07/12/2022 1108   HCT 38.6 07/12/2022 1108   PLT 201.0 07/12/2022 1108   MCV 92.3 07/12/2022 1108   MCH 30.5 04/03/2022 1425   MCHC 33.4 07/12/2022 1108   RDW 13.5 07/12/2022 1108   LYMPHSABS 3.4 07/12/2022 1108   MONOABS 0.9 07/12/2022 1108   EOSABS 0.3 07/12/2022 1108   BASOSABS 0.1 07/12/2022 1108    Lithium  Lvl  Date Value Ref Range Status  05/22/2021 0.7 0.6 - 1.2 mmol/L Final     No results found for: PHENYTOIN, PHENOBARB, VALPROATE, CBMZ   .res Assessment: Plan:    Plan:  Continue: Remeron 30mg  hs  Vraylar 1.5mg  every other day Lorazepam  0.5mg  TID - only taking one tablet at bedtime.  BMP ordered  RTC 4 weeks  30 minutes spent dedicated to the care of this patient on the date of this encounter to include pre-visit review of records, ordering of medication, post visit documentation, and face-to-face time with the patient discussing PTSD, insomnia, GAD and BPD- 1. Discussed continuing current medication regimen.  Diagnoses and all orders for this visit:  Bipolar I disorder (HCC) -     mirtazapine (REMERON) 30 MG tablet; Take 1 tablet (30 mg total) by mouth at bedtime.  High risk medication use -     Basic metabolic panel  Generalized anxiety disorder  Insomnia due to other mental disorder  PTSD (post-traumatic stress disorder)     Please see After Visit Summary for patient specific instructions.  Future Appointments  Date Time Provider Department Center  09/24/2023  1:00 PM Chimaobi Casebolt Nattalie, NP CP-CP None  10/24/2023 11:30 AM Shamleffer, Julian Obey, MD [facility] None   05/03/2024  1:40  PM [facility] ANNUAL WELLNESS VISIT 1 [facility] P[facility]    Orders Placed This Encounter  Procedures   Basic metabolic panel    -------------------------------

## 2023-08-29 ENCOUNTER — Ambulatory Visit: Payer: Medicare Other | Admitting: Neurology

## 2023-09-01 ENCOUNTER — Encounter: Payer: Self-pay | Admitting: Adult Health

## 2023-09-03 ENCOUNTER — Telehealth: Admitting: Adult Health

## 2023-09-03 ENCOUNTER — Encounter: Payer: Self-pay | Admitting: Adult Health

## 2023-09-03 DIAGNOSIS — F411 Generalized anxiety disorder: Secondary | ICD-10-CM | POA: Diagnosis not present

## 2023-09-03 DIAGNOSIS — F319 Bipolar disorder, unspecified: Secondary | ICD-10-CM | POA: Diagnosis not present

## 2023-09-03 DIAGNOSIS — F5105 Insomnia due to other mental disorder: Secondary | ICD-10-CM

## 2023-09-03 DIAGNOSIS — F99 Mental disorder, not otherwise specified: Secondary | ICD-10-CM

## 2023-09-03 DIAGNOSIS — F4310 Post-traumatic stress disorder, unspecified: Secondary | ICD-10-CM

## 2023-09-03 NOTE — Progress Notes (Signed)
 Tiffany [name] [id number] 11/16/1950 73 y.o.  Virtual Visit via Video Note  I connected with pt @ on 09/03/23 at  4:00 PM EDT by a video enabled telemedicine application and verified that I am speaking with the correct person using two identifiers.   I discussed the limitations of evaluation and management by telemedicine and the availability of in person appointments. The patient expressed understanding and agreed to proceed.  I discussed the assessment and treatment plan with the patient. The patient was provided an opportunity to ask questions and all were answered. The patient agreed with the plan and demonstrated an understanding of the instructions.   The patient was advised to call back or seek an in-person evaluation if the symptoms worsen or if the condition fails to improve as anticipated.  I provided 30 minutes of non-face-to-face time during this encounter.  The patient was located at home.  The provider was located at [facility].   [name], NP   Subjective:   Patient ID:  Tiffany [name] is a 73 y.o. (DOB 06/07/1950) female.  Chief Complaint: No chief complaint on file.   HPI Mirtha Jain Maron presents for follow-up of PTSD, insomnia, GAD and BPD- 1.  Describes mood today as ok. Pleasant. Denies tearfulness. Mood symptoms - denies depression or irritability. Reports anxiety - 'it comes and goes. Reports improved interest and motivation. Denies panic attacks. Reports some worry, rumination and over thinking. Denies obsessive thoughts. Denies racing thoughts. Reports mood is stable. Stating I feel like I'm doing ok. Taking medications as prescribed.  Energy levels stable. Active, exercising routinely.  Enjoys some usual interests and activities. Married. Lives with husband and daughter. Talking to family and friends.  Appetite adequate. Weight loss - 168  from 170 pounds. Sleeps well most nights. Averages 8 or more hours. Denies daytime napping. Reports  focus and concentration improving. Reports reading a book. Completing tasks. Managing some aspects of household. Retired.  Denis SI. Denies HI.  Denies AH or VH. Denies self harm. Denies substance use.  Previous medications: Celexa, Zyprexa, Tegretol, Depakote , Serzone, Topamax, Seroquel, Effexor, Lexapro, Desipramine, Neurontin, Abilify, Geodon, Propanolol, Cymbalta, Cogentin, Trihexyphenadyl, Sinmmet, Provigil, Selegiline, Requip, Amantadine, Prozac, Mirapex, Azilect, Metoclopramide, Baclofen, Artane, Namenda, Latuda , Lithium ,    Review of Systems:  Review of Systems  Musculoskeletal:  Negative for gait problem.  Neurological:  Negative for tremors.  Psychiatric/Behavioral:         Please refer to HPI    Medications: I have reviewed the patient's current medications.  Current Outpatient Medications  Medication Sig Dispense Refill   amLODipine  (NORVASC ) 5 MG tablet TAKE 1 TABLET(5 MG) BY MOUTH DAILY 90 tablet 1   atorvastatin  (LIPITOR) 10 MG tablet Take 0.5 tablets (5 mg total) by mouth daily. 45 tablet 3   augmented betamethasone dipropionate (DIPROLENE-AF) 0.05 % cream APPLY TO THE AFFECTED AREAS OF LEG TWICE DAILY AS NEEDED FOR RASH. NOT TO FACE, GROIN, UNDERARMS 50 g 0   cariprazine (VRAYLAR) 1.5 MG capsule every other day.     Cholecalciferol  50 MCG (2000 UT) TABS 1 tablet Orally Once a day for 30 day(s)     Golimumab (SIMPONI ARIA IV) Infusion every 8 weeks     ipratropium (ATROVENT ) 0.03 % nasal spray Place 2 sprays into both nostrils every 12 (twelve) hours. 30 mL 12   levothyroxine  (SYNTHROID ) 75 MCG tablet Take 1 tablet (75 mcg total) by mouth daily. 90 tablet 3   LORazepam  (ATIVAN ) 0.5 MG tablet Take 0.5  mg by mouth 3 (three) times daily as needed.     metoprolol  succinate (TOPROL  XL) 25 MG 24 hr tablet Take 0.5 tablets (12.5 mg total) by mouth daily. 45 tablet 3   metroNIDAZOLE  (METROGEL ) 0.75 % gel Apply to face 1-2 times daily 45 g 1   mirtazapine  (REMERON ) 30 MG  tablet Take 1 tablet (30 mg total) by mouth at bedtime. 90 tablet 0   Omega-3 Fatty Acids (FISH OIL) 1200 MG CAPS Take 1 capsule by mouth in the morning and at bedtime.     Probiotic Product (ALIGN) 4 MG CAPS Take 1 capsule by mouth daily in the afternoon.     No current facility-administered medications for this visit.    Medication Side Effects: None  Allergies:  Allergies  Allergen Reactions   Aripiprazole Other (See Comments)    Parkinsonism      Lactose Intolerance (Gi) Diarrhea   Methotrexate Other (See Comments)    Hair loss, severe stomatitis     Nsaids     CKD 4   Cefdinir Diarrhea    Other reaction(s): Diarrhea Yeast infection and fever; negative c diff   Etanercept Other (See Comments)    Headaches     Exemestane Other (See Comments)    Suicidal thoughts with medication     Fluoxetine Other (See Comments)    Parkinsonism   Methylprednisolone  Sodium Succ Other (See Comments)    Agitated mania   Other Other (See Comments)    Paper tape causes bruising.   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 1.4 cm  Grade I invasive lobular, ER and PR +/Her--, Ki 67 <5% Tried anastrozole for one month, but developed suicidal idea   Osteoarthritis, knee 09/24/2019   Xray 09/2019   Parkinsonism (HCC)    Drug induced   Polyposis of colon    Psoriatic arthritis (HCC)    PTSD (post-traumatic stress disorder)    Secondary hyperparathyroidism (HCC)    Tardive dyskinesia    Thyroid  disease     Family History   Problem Relation Age of Onset   Lymphoma Mother 102   Lymphoma Sister 54   Breast cancer Sister 31   Colon polyps Sister    Lung cancer Sister        former smoker   Stroke Maternal Grandfather    Diabetes Paternal Grandfather    Colon cancer Neg Hx    Esophageal cancer Neg Hx    Stomach cancer Neg Hx    Rectal cancer Neg Hx     Social History   Socioeconomic History   Marital status: Married    Spouse name: Rona   Number of children: 3   Years of education: Not on file   Highest education level: Associate degree: occupational, Scientist, product/process development, or vocational program  Occupational History   Occupation: retired  Tobacco Use   Smoking status: Never   Smokeless tobacco: Never  Vaping Use   Vaping status: Never  Used  Substance and Sexual Activity   Alcohol use: Never   Drug use: Never   Sexual activity: Not Currently  Other Topics Concern   Not on file  Social History Narrative   Moved to area from [place]  08/2018   Lives one story home   Right handed.   Social Drivers of Corporate investment banker Strain: Low Risk  (04/24/2023)   Overall Financial Resource Strain (CARDIA)    Difficulty of Paying Living Expenses: Not hard at all  Food Insecurity: No Food Insecurity (04/24/2023)   Hunger Vital Sign    Worried About Running Out of Food in the Last Year: Never true    Ran Out of Food in the Last Year: Never true  Transportation Needs: No Transportation Needs (04/24/2023)   PRAPARE - Administrator, Civil Service (Medical): No    Lack of Transportation (Non-Medical): No  Physical Activity: Sufficiently Active (04/24/2023)   Exercise Vital Sign    Days of Exercise per Week: 3 days    Minutes of Exercise per Session: 50 min  Recent Concern: Physical Activity - Insufficiently Active (03/23/2023)   Exercise Vital Sign    Days of Exercise per Week: 3 days    Minutes of Exercise per Session: 40 min  Stress: No Stress Concern Present (04/24/2023)   [employer]  of Occupational Health - Occupational Stress Questionnaire    Feeling of Stress : Not at all  Social Connections: Moderately Integrated (04/24/2023)   Social Connection and Isolation Panel    Frequency of Communication with Friends and Family: More than three times a week    Frequency of Social Gatherings with Friends and Family: More than three times a week    Attends Religious Services: More than 4 times per year    Active Member of [employer] or Organizations: No    Attends Banker Meetings: Never    Marital Status: Married  Catering manager Violence: Not At Risk (04/24/2023)   Humiliation, Afraid, Rape, and Kick questionnaire    Fear of Current or Ex-Partner: No    Emotionally Abused: No    Physically Abused: No    Sexually Abused: No    Past Medical History, Surgical history, Social history, and Family history were reviewed and updated as appropriate.   Please see review of systems for further details on the patient's review from today.   Objective:   Physical Exam:  There were no vitals taken for this visit.  Physical Exam Constitutional:      General: She is not in acute distress.  Musculoskeletal:        General: No deformity.   Neurological:     Mental Status: She is alert and oriented to person, place, and time.     Coordination: Coordination normal.   Psychiatric:        Attention and Perception: Attention and perception normal. She does not perceive auditory or visual hallucinations.        Mood and Affect: Mood normal. Mood is not anxious or depressed. Affect is not labile, blunt, angry or inappropriate.        Speech: Speech normal.        Behavior: Behavior normal.        Thought Content: Thought content normal. Thought content is not paranoid or delusional. Thought content does not include homicidal or suicidal ideation. Thought content does not include homicidal or suicidal plan.        Cognition and Memory: Cognition and  memory normal.         Judgment: Judgment normal.     Comments: Insight intact     Lab Review:     Component Value Date/Time   NA 139 07/23/2023 1035   K 4.1 07/23/2023 1035   CL 104 07/23/2023 1035   CO2 18 (L) 07/23/2023 1035   GLUCOSE 83 07/23/2023 1035   GLUCOSE 69 (L) 07/12/2022 1108   BUN 31 (H) 07/23/2023 1035   CREATININE 2.43 (H) 07/23/2023 1035   CREATININE 2.29 (H) 05/22/2021 1044   CALCIUM  9.0 07/23/2023 1035   PROT 7.1 07/15/2022 1515   ALBUMIN 4.4 07/15/2022 1515   AST 25 07/15/2022 1515   ALT 29 07/15/2022 1515   ALKPHOS 138 (H) 07/15/2022 1515   BILITOT 0.9 07/15/2022 1515   GFRNONAA 22 (L) 04/03/2022 1425   GFRAA 25 (L) 09/13/2019 1243       Component Value Date/Time   WBC 7.7 07/12/2022 1108   RBC 4.18 07/12/2022 1108   HGB 12.9 07/12/2022 1108   HCT 38.6 07/12/2022 1108   PLT 201.0 07/12/2022 1108   MCV 92.3 07/12/2022 1108   MCH 30.5 04/03/2022 1425   MCHC 33.4 07/12/2022 1108   RDW 13.5 07/12/2022 1108   LYMPHSABS 3.4 07/12/2022 1108   MONOABS 0.9 07/12/2022 1108   EOSABS 0.3 07/12/2022 1108   BASOSABS 0.1 07/12/2022 1108    Lithium  Lvl  Date Value Ref Range Status  05/22/2021 0.7 0.6 - 1.2 mmol/L Final     No results found for: PHENYTOIN, PHENOBARB, VALPROATE, CBMZ   .res Assessment: Plan:    Plan:  Continue: Remeron  30mg  hs  Vraylar 1.5mg  every other day Lorazepam  0.5mg  TID - only taking one tablet at bedtime.  BMP reviewed - discussed concerns regarding kidney function and  plan to consider medication changes.  RTC 4 weeks  30 minutes spent dedicated to the care of this patient on the date of this encounter to include pre-visit review of records, ordering of medication, post visit documentation, and face-to-face time with the patient discussing PTSD, insomnia, GAD and BPD- 1. Discussed continuing current medication regimen.  There are no diagnoses linked to this encounter.   Please see After Visit Summary for patient specific  instructions.  Future Appointments  Date Time Provider Department Center  09/03/2023  4:00 PM Antoino Westhoff Nattalie, NP [facility] None  09/24/2023  1:00 PM Raun Routh Nattalie, NP [facility] None  10/24/2023 11:30 AM Shamleffer, Donell Cardinal, MD [facility] None  10/29/2023 11:00 AM Garnell Begeman Nattalie, NP [facility] None  05/03/2024  1:40 PM [facility] ANNUAL WELLNESS VISIT 1 [facility]    No orders of the defined types were placed in this encounter.     -------------------------------

## 2023-09-23 ENCOUNTER — Encounter: Payer: Self-pay | Admitting: Physician Assistant

## 2023-09-24 ENCOUNTER — Encounter: Payer: Self-pay | Admitting: Adult Health

## 2023-09-24 ENCOUNTER — Ambulatory Visit (INDEPENDENT_AMBULATORY_CARE_PROVIDER_SITE_OTHER): Admitting: Adult Health

## 2023-09-24 DIAGNOSIS — F319 Bipolar disorder, unspecified: Secondary | ICD-10-CM

## 2023-09-24 DIAGNOSIS — F4310 Post-traumatic stress disorder, unspecified: Secondary | ICD-10-CM | POA: Diagnosis not present

## 2023-09-24 DIAGNOSIS — F411 Generalized anxiety disorder: Secondary | ICD-10-CM

## 2023-09-24 DIAGNOSIS — F99 Mental disorder, not otherwise specified: Secondary | ICD-10-CM

## 2023-09-24 DIAGNOSIS — F5105 Insomnia due to other mental disorder: Secondary | ICD-10-CM

## 2023-09-24 MED ORDER — LORAZEPAM 0.5 MG PO TABS: 0.5000 mg | ORAL_TABLET | Freq: Two times a day (BID) | ORAL | 2 refills | Status: DC | PRN

## 2023-09-24 NOTE — Progress Notes (Signed)
 Tiffany [name] [id number] [birth year] 73 y.o.  Subjective:   Patient ID:  Tiffany [name] is a 73 y.o. (DOB 15-[name]-1951) female.  Chief Complaint: No chief complaint on file.   HPI Tiffany [name] presents to the office today for follow-up of PTSD, insomnia, GAD and BPD- 1.  Describes mood today as ok. Pleasant. Denies tearfulness. Mood symptoms - denies depression - brain trying to go there now and then, but pushes through it. Denies irritability. Reports anxiety at times - situational. Reports stable interest and motivation. Denies panic attacks. Denies worry, rumination and over thinking. Denies obsessive thoughts. Denies racing thoughts. Reports mood is stable. Stating I feel like I'm doing good. Taking medications as prescribed.  Energy levels stable. Active, exercising routinely - 3 times a week.  Enjoys some usual interests and activities. Married. Lives with husband and daughter. Talking to family and friends.  Appetite adequate - eating healthy - renal diet. Weight loss - 165 pounds. Sleeps well most nights. Averages 8 hours - feeling rested and refreshed. Denies daytime napping. Reports focus and concentration needs improvement. Reports reading the bible [place]. Completing tasks. Managing some aspects of household. Retired.  Denis SI. Denies HI.  Denies AH or VH. Denies self harm. Denies substance use.  Previous medications: Celexa, Zyprexa, Tegretol, Depakote , Serzone, Topamax, Seroquel, Effexor, Lexapro, Desipramine, Neurontin, Abilify, Geodon, Propanolol, Cymbalta, Cogentin, Trihexyphenadyl, Sinmmet, Provigil, Selegiline, Requip, Amantadine, Prozac, Mirapex, Azilect, Metoclopramide, Baclofen, Artane, Namenda, Latuda , Lithium ,    GAD-7    Flowsheet Row Office Visit from 12/30/2022 in [facility] [employer] from 10/29/2022 in [facility] [employer] [facility] [employer] Visit from 05/06/2022 in [facility] [employer] from 04/25/2022 in [facility] [employer] Visit from 04/10/2022 in [facility] [place]  Total GAD-7 Score 0 16 4 9 17    Mini-Mental    Flowsheet Row Office Visit from 06/07/2019 in [facility]  Total Score (max 30 points ) 29   PHQ2-9    Flowsheet Row Clinical Support from 04/24/2023 in [facility] [employer] Visit from 12/30/2022 in [facility] [employer] Visit from 10/29/2022 in [facility] [employer] Visit from 07[facility]15/2022 in [facility] [employer] Visit from 05/06/2022 in [facility] [place]  PHQ-2 Total Score 0 0 2 3 0  PHQ-9 Total Score 0 -- 13 15 0   Flowsheet Row Clinical Support from 04/15/2022 in [facility] from 04/03/2022 in [facility] at [facility] from 11/26/2021 in [facility] at [facility]  C-SSRS RISK CATEGORY No Risk No Risk No Risk     Review of Systems:  Review of Systems  Musculoskeletal:  Negative for gait problem.  Neurological:  Negative for tremors.  Psychiatric/Behavioral:         Please refer to HPI    Medications: I have reviewed the patient's current medications.  Current Outpatient Medications  Medication Sig Dispense Refill   amLODipine  (NORVASC ) 5 MG tablet TAKE 1 TABLET(5 MG) BY MOUTH DAILY 90 tablet 1   atorvastatin  (LIPITOR) 10 MG tablet Take 0.5 tablets (5 mg total) by mouth daily. 45 tablet 3   augmented  betamethasone dipropionate (DIPROLENE-AF) 0.05 % cream APPLY TO THE AFFECTED AREAS OF LEG TWICE DAILY AS NEEDED FOR RASH. NOT TO FACE, GROIN, UNDERARMS 50 g 0   cariprazine (VRAYLAR) 1.5 MG capsule every other day.     Cholecalciferol  50 MCG (2000 UT) TABS 1 tablet  Orally Once a day for 30 day(s)     Golimumab (SIMPONI ARIA IV) Infusion every 8 weeks     ipratropium (ATROVENT ) 0.03 % nasal spray Place 2 sprays into both nostrils every 12 (twelve) hours. 30 mL 12   levothyroxine  (SYNTHROID ) 75 MCG tablet Take 1 tablet (75 mcg total) by mouth daily. 90 tablet 3   LORazepam  (ATIVAN ) 0.5 MG tablet Take 0.5 mg by mouth 3 (three) times daily as needed.     metoprolol  succinate (TOPROL  XL) 25 MG 24 hr tablet Take 0.5 tablets (12.5 mg total) by mouth daily. 45 tablet 3   metroNIDAZOLE  (METROGEL ) 0.75 % gel Apply to face 1-2 times daily 45 g 1   mirtazapine  (REMERON ) 30 MG tablet Take 1 tablet (30 mg total) by mouth at bedtime. 90 tablet 0   Omega-3 Fatty Acids (FISH OIL) 1200 MG CAPS Take 1 capsule by mouth in the morning and at bedtime.     Probiotic Product (ALIGN) 4 MG CAPS Take 1 capsule by mouth daily in the afternoon.     No current facility-administered medications for this visit.    Medication Side Effects: None  Allergies:  Allergies  Allergen Reactions   Aripiprazole Other (See Comments)    Parkinsonism      Lactose Intolerance (Gi) Diarrhea   Methotrexate Other (See Comments)    Hair loss, severe stomatitis     Nsaids     CKD 4   Cefdinir Diarrhea    Other reaction(s): Diarrhea Yeast infection and fever; negative c diff   Etanercept Other (See Comments)    Headaches     Exemestane Other (See Comments)    Suicidal thoughts with medication     Fluoxetine Other (See Comments)    Parkinsonism   Methylprednisolone  Sodium Succ Other (See Comments)    Agitated mania   Other Other (See Comments)    Paper tape causes bruising.   Epinephrine  Palpitations    tachycardia    Nitrofurantoin Other (See Comments)     GI upset from 2012    Past Medical History:  Diagnosis Date   Bipolar 1 disorder (HCC)    Chronic diarrhea    loose stools twice a day on average for years   CKD stage G4/A1, GFR 15-29 and albumin creatinine ratio <30  mg/g (HCC)    Depression 1987   Malignant neoplasm of overlapping sites of left breast in female, estrogen receptor positive (HCC) 03/14/2016   Dx in 09/2014, s/p bilateral mastectomies and ALND, 0/10 LN. 1.4 cm  Grade I invasive lobular, ER and PR +/Her--, Ki 67 <5% Tried anastrozole for one month, but developed suicidal idea   Osteoarthritis, knee 09/24/2019   Xray 09/2019   Parkinsonism (HCC)    Drug induced   Polyposis of colon    Psoriatic arthritis (HCC)    PTSD (post-traumatic stress disorder)    Secondary hyperparathyroidism (HCC)    Tardive dyskinesia    Thyroid  disease     Past Medical History, Surgical history, Social history, and Family history were reviewed and updated as appropriate.   Please see review of systems for further details on the patient's review from today.   Objective:   Physical Exam:  There were no vitals taken for this visit.  Physical Exam Constitutional:      General: She is not in acute distress. Musculoskeletal:        General: No deformity.  Neurological:     Mental Status: She is alert and oriented to person, place, and time.     Coordination: Coordination normal.  Psychiatric:        Attention and Perception: Attention and perception normal. She does not perceive auditory or visual hallucinations.        Mood and Affect: Mood normal. Mood is not anxious or depressed. Affect is not labile, blunt, angry or inappropriate.        Speech: Speech normal.        Behavior: Behavior normal.        Thought Content: Thought content normal. Thought content is not paranoid or delusional. Thought content does not include homicidal or suicidal ideation. Thought content does not include homicidal or suicidal plan.        Cognition and Memory: Cognition and memory normal.        Judgment: Judgment normal.     Comments: Insight intact     Lab Review:     Component Value Date/Time   NA 139 07/23/2023 1035   K 4.1 07/23/2023 1035   CL 104 07/23/2023  1035   CO2 18 (L) 07/23/2023 1035   GLUCOSE 83 07/23/2023 1035   GLUCOSE 69 (L) 07/12/2022 1108   BUN 31 (H) 07/23/2023 1035   CREATININE 2.43 (H) 07/23/2023 1035   CREATININE 2.29 (H) 05/22/2021 1044   CALCIUM  9.0 07/23/2023 1035   PROT 7.1 07/15/2022 1515   ALBUMIN 4.4 07/15/2022 1515   AST 25 07/15/2022 1515   ALT 29 07/15/2022 1515   ALKPHOS 138 (H) 07/15/2022 1515   BILITOT 0.9 07/15/2022 1515   GFRNONAA 22 (L) 04/03/2022 1425   GFRAA 25 (L) 09/13/2019 1243       Component Value Date/Time   WBC 7.7 07/12/2022 1108   RBC 4.18 07/12/2022 1108   HGB 12.9 07/12/2022 1108   HCT 38.6 07/12/2022 1108   PLT 201.0 07/12/2022 1108   MCV 92.3 07/12/2022 1108   MCH 30.5 04/03/2022 1425   MCHC 33.4 07/12/2022 1108   RDW 13.5 07/12/2022 1108   LYMPHSABS 3.4 07/12/2022 1108   MONOABS 0.9 07/12/2022 1108   EOSABS 0.3 07/12/2022 1108   BASOSABS 0.1 07/12/2022 1108    Lithium  Lvl  Date Value Ref Range Status  05/22/2021 0.7 0.6 - 1.2 mmol/L Final     No results found for: PHENYTOIN, PHENOBARB, VALPROATE, CBMZ   .res Assessment: Plan:    Plan:  Continue: Remeron  30mg  hs  Vraylar 1.5mg  every other day Lorazepam  0.5mg  TID - only taking one tablet at bedtime.  BMP reviewed - discussed concerns regarding kidney function and plan to consider medication changes.  RTC 4 weeks  30 minutes spent dedicated to the care of this patient on the date of this encounter to include pre-visit review of records, ordering of medication, post visit documentation, and face-to-face time with the patient discussing PTSD, insomnia, GAD and BPD- 1. Discussed continuing current medication regimen.  There are no diagnoses linked to this encounter.   Please see After Visit Summary for patient specific instructions.  Future Appointments  Date Time Provider Department Center  09/24/2023  1:00 PM Carlicia Leavens Nattalie, NP [facility] None  10/24/2023 11:30 AM Shamleffer, Donell Cardinal, MD  [facility] None  10/29/2023 11:00 AM Alix Stowers Nattalie,  NP [facility] None  05/03/2024  1:40 PM [facility] ANNUAL WELLNESS VISIT 1 [facility]    No orders of the defined types were placed in this encounter.   -------------------------------

## 2023-10-24 ENCOUNTER — Ambulatory Visit (INDEPENDENT_AMBULATORY_CARE_PROVIDER_SITE_OTHER): Admitting: Internal Medicine

## 2023-10-24 ENCOUNTER — Encounter: Payer: Self-pay | Admitting: Internal Medicine

## 2023-10-24 VITALS — BP 120/68 | HR 54 | Ht 64.0 in | Wt 163.0 lb

## 2023-10-24 DIAGNOSIS — E039 Hypothyroidism, unspecified: Secondary | ICD-10-CM

## 2023-10-24 LAB — TSH: TSH: 1.4 m[IU]/L (ref 0.40–4.50)

## 2023-10-24 LAB — T4, FREE: Free T4: 1.4 ng/dL (ref 0.8–1.8)

## 2023-10-24 NOTE — Progress Notes (Unsigned)
 Name: Tiffany [name]  MRN/ DOB: [id number], 1950/05/30    Age/ Sex: 73 y.o., female    PCP: Job Lukes, PA   Reason for Endocrinology Evaluation: Hypothyroidism     Date of Initial Endocrinology Evaluation: 12/31/2021    HPI: Tiffany [name] is a 73 y.o. female with a past medical history of Hx of Breast Ca (no XRT no chemo), bipolar disorder, CKD, TD and parkinson's disease. The patient presented for initial endocrinology clinic visit on 12/31/2021 for consultative assistance with her Hypothyroidism.     Most of the history has been obtained from her spouse Pt has been diagnosed with hypothyroidism ~ 30 yrs ago while on Lithium   requiring LT-4 replacement.  She was on lithium  until ~ 11/2021 when it was stopped  due to CKD which resulted in lower TSH and fluctuating TFT's    Saw Dr. Balan which has reduced levothyroxine  to  below dose   Sister with thyroid  disease  SUBJECTIVE:    Today (10/24/23):  Ms. [name] is here for follow-up on hypothyroidism . She is accompanied by her spouse today.   She continues to follow-up with rheumatology She continues to follow-up with [facility] for [facility] She continues to follow-up with behavioral health  Patient has been noted with weight loss, that she attributes to renal diet No local neck swelling  Continues with depression and depression  Denies constipation but has loose stools this morning  Denies palpitations  Uses a pill box    Levothyroxine  75 mcg daily    HISTORY:  Past Medical History:  Past Medical History:  Diagnosis Date   Bipolar 1 disorder (HCC)    Chronic diarrhea    loose stools twice a day on average for years   CKD stage G4/A1, 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 1.4 cm  Grade I  invasive lobular, ER and PR +/Her--, Ki 67 <5% Tried anastrozole for one month, but developed suicidal idea   Osteoarthritis, knee 09/24/2019   Xray 09/2019   Parkinsonism (HCC)    Drug induced   Polyposis of colon    Psoriatic arthritis (HCC)    PTSD (post-traumatic stress disorder)    Secondary hyperparathyroidism (HCC)    Tardive dyskinesia    Thyroid  disease    Past Surgical History:  Past Surgical History:  Procedure Laterality Date   ABDOMINAL HYSTERECTOMY  1987   CHOLECYSTECTOMY  1979   DILATION AND CURETTAGE OF UTERUS  1973   MASTECTOMY Bilateral 09/19/2014   TONSILLECTOMY  1970   URETERAL REIMPLANTION Bilateral 1974    Social History:  reports that she has never smoked. She has never used smokeless tobacco. She reports that she does not drink alcohol and does not use drugs. Family History: family history includes Breast cancer (age of onset: 62) in her sister; Colon polyps in her sister; Diabetes in her paternal grandfather; Lung cancer in her sister; Lymphoma (age of onset: 28) in her sister; Lymphoma (age of onset: 102) in her mother; Stroke in her maternal grandfather.   HOME MEDICATIONS: Allergies as of 10/24/2023       Reactions   Aripiprazole Other (See Comments)   Parkinsonism     Lactose Intolerance (gi) Diarrhea   Methotrexate Other (See Comments)   Hair loss,  severe stomatitis   Nsaids    CKD 4   Cefdinir Diarrhea   Other reaction(s): Diarrhea Yeast infection and fever; negative c diff   Etanercept Other (See Comments)   Headaches   Exemestane Other (See Comments)   Suicidal thoughts with medication   Fluoxetine Other (See Comments)   Parkinsonism   Methylprednisolone  Sodium Succ Other (See Comments)   Agitated mania   Other Other (See Comments)   Paper tape causes bruising.   Epinephrine  Palpitations   tachycardia   Nitrofurantoin Other (See Comments)    GI upset from 2012        Medication List        Accurate as of October 24, 2023  11:28 AM. If you have any questions, ask your nurse or doctor.          Align 4 MG Caps Take 1 capsule by mouth daily in the afternoon.   amLODipine  5 MG tablet Commonly known as: NORVASC  TAKE 1 TABLET(5 MG) BY MOUTH DAILY   atorvastatin  10 MG tablet Commonly known as: LIPITOR Take 0.5 tablets (5 mg total) by mouth daily.   augmented betamethasone dipropionate 0.05 % cream Commonly known as: DIPROLENE-AF APPLY TO THE AFFECTED AREAS OF LEG TWICE DAILY AS NEEDED FOR RASH. NOT TO FACE, GROIN, UNDERARMS   Cholecalciferol  50 MCG (2000 UT) Tabs 1 tablet Orally Once a day for 30 day(s)   Fish Oil 1200 MG Caps Take 1 capsule by mouth in the morning and at bedtime.   ipratropium 0.03 % nasal spray Commonly known as: ATROVENT  Place 2 sprays into both nostrils every 12 (twelve) hours.   levothyroxine  75 MCG tablet Commonly known as: SYNTHROID  Take 1 tablet (75 mcg total) by mouth daily.   LORazepam  0.5 MG tablet Commonly known as: ATIVAN  Take 1 tablet (0.5 mg total) by mouth 2 (two) times daily as needed.   metoprolol  succinate 25 MG 24 hr tablet Commonly known as: Toprol  XL Take 0.5 tablets (12.5 mg total) by mouth daily.   metroNIDAZOLE  0.75 % gel Commonly known as: METROGEL  Apply to face 1-2 times daily   mirtazapine  30 MG tablet Commonly known as: REMERON  Take 1 tablet (30 mg total) by mouth at bedtime. What changed:  how much to take additional instructions   SIMPONI ARIA IV Infusion every 8 weeks   Vraylar 1.5 MG capsule Generic drug: cariprazine every other day.          REVIEW OF SYSTEMS: A comprehensive ROS was conducted with the patient and is negative except as per HPI   OBJECTIVE:  VS: BP 120/68 (BP Location: Right Arm, Patient Position: Sitting, Cuff Size: Normal)   Pulse (!) 54   Ht 5' 4 (1.626 m)   Wt 163 lb (73.9 kg)   SpO2 97%   BMI 27.98 kg/m    Wt Readings from Last 3 Encounters:  10/24/23 163 lb (73.9 kg)  06/27/23 181 lb 9.6  oz (82.4 kg)  06/23/23 181 lb 3.2 oz (82.2 kg)     EXAM: General: Pt appears well and is in NAD  Neck: General: Supple without adenopathy. Thyroid : Thyroid  size normal.  No goiter or nodules appreciated.   Lungs: Clear with good BS bilat   Heart: Auscultation: RRR.  Extremities:  BL LE: No pretibial edema   Mental Status: Judgment, insight: Intact Orientation: Oriented to time, place, and person Mood and affect: No depression, anxiety, or agitation     DATA REVIEWED:      Latest Reference  Range & Units 10/24/23 11:51  TSH 0.40 - 4.50 mIU/L 1.40  T4,Free(Direct) 0.8 - 1.8 ng/dL 1.4     ASSESSMENT/PLAN/RECOMMENDATIONS:   Hypothyroidism:   -This has been attributed to chronic lithium  use.  She has been off lithium  since  ~ 11/2021  - Will try to keep TSH goal 1-3 uIU/mL  - TFT's within normal range, no changes    Medications :  Continue levothyroxine  75 mcg daily  F/U in 6 months      Signed electronically by: Stefano Redgie Butts, MD  [facility] Endocrinology  [facility] [address] [place] [place], [place] [address] Phone: [phone] FAX: [phone]   CC: Job Lukes, [place] [address] [place] [address] Phone: [phone] Fax: [phone]   Return to Endocrinology clinic as below: Future Appointments  Date Time Provider Department Center  10/24/2023 11:30 AM Sayler Mickiewicz, Donell Redgie, MD [facility] None  10/29/2023 11:00 AM Mozingo, Regina Nattalie, NP [facility] None  11/27/2023  1:00 PM Mozingo, Regina Nattalie, NP [facility] None  05/03/2024  1:40 PM [facility] ANNUAL WELLNESS VISIT 1 [facility]

## 2023-10-27 ENCOUNTER — Ambulatory Visit: Payer: Self-pay | Admitting: Internal Medicine

## 2023-10-27 ENCOUNTER — Telehealth: Payer: Self-pay | Admitting: Internal Medicine

## 2023-10-27 MED ORDER — LEVOTHYROXINE SODIUM 75 MCG PO TABS: 75.0000 ug | ORAL_TABLET | Freq: Every day | ORAL | 3 refills | Status: DC

## 2023-10-29 ENCOUNTER — Encounter: Payer: Self-pay | Admitting: Adult Health

## 2023-10-29 ENCOUNTER — Ambulatory Visit: Admitting: Adult Health

## 2023-10-29 DIAGNOSIS — F99 Mental disorder, not otherwise specified: Secondary | ICD-10-CM

## 2023-10-29 DIAGNOSIS — F319 Bipolar disorder, unspecified: Secondary | ICD-10-CM

## 2023-10-29 DIAGNOSIS — F4310 Post-traumatic stress disorder, unspecified: Secondary | ICD-10-CM

## 2023-10-29 DIAGNOSIS — F411 Generalized anxiety disorder: Secondary | ICD-10-CM | POA: Diagnosis not present

## 2023-10-29 DIAGNOSIS — F5105 Insomnia due to other mental disorder: Secondary | ICD-10-CM

## 2023-10-29 MED ORDER — METOPROLOL SUCCINATE ER 25 MG PO TB24: 12.5000 mg | ORAL_TABLET | Freq: Every day | ORAL | 2 refills | Status: AC

## 2023-10-29 NOTE — Telephone Encounter (Signed)
*  STAT* If patient is at the pharmacy, call can be transferred to refill team.   1. Which medications need to be refilled? (please list name of each medication and dose if known)   metoprolol  succinate (TOPROL  XL) 25 MG 24 hr tablet   2. Would you like to learn more about the convenience, safety, & potential cost savings by using the [facility]?   3. Are you open to using the [facility] (Type [facility]. ).  4. Which pharmacy/location (including street and city if local pharmacy) is medication to be sent to?  [id number] - [place], [place] - [address]   5. Do they need a 30 day or 90 day supply?     Husband Artie) stated patient still has some medication.

## 2023-10-29 NOTE — Progress Notes (Signed)
 Tiffany [name] [id number] [birth year] 73 y.o.  Subjective:   Patient ID:  Tiffany [name] is a 73 y.o. (DOB [birth year]) female.  Chief Complaint: No chief complaint on file.   HPI Tiffany [name] presents to the office today for follow-up of PTSD, insomnia, GAD and BPD- 1.  Describes mood today as ok. Pleasant. Denies tearfulness. Mood symptoms - denies depression - just occasionally. Denies irritability. Reports anxiety at times - some of the time. Reports stable interest and motivation. Denies panic attacks. Reports some worry, rumination and over thinking -  sometimes. Denies obsessive thoughts. Denies racing thoughts. Reports mood is stable. Stating I feel like I'm doing ok. Taking medications as prescribed.  Energy levels stable. Active, has a regular exercise routine. Enjoys some usual interests and activities. Married. Lives with husband and daughter. Talking to family and friends.  Appetite adequate - eating healthy - renal diet. Weight loss - 161 from 165 pounds. Sleeps well most nights. Averages 8 hours. Denies daytime napping. Reports focus and concentration still needs an improvement. Reports having to write things down to remember them. Reports reading the bible daily. Completing tasks. Managing some aspects of household. Retired.  Denis SI. Denies HI.  Denies AH or VH. Denies self harm. Denies substance use.  Previous medications: Celexa, Zyprexa, Tegretol, Depakote , Serzone, Topamax, Seroquel, Effexor, Lexapro, Desipramine, Neurontin, Abilify, Geodon, Propanolol, Cymbalta, Cogentin, Trihexyphenadyl, Sinmmet, Provigil, Selegiline, Requip, Amantadine, Prozac, Mirapex, Azilect, Metoclopramide, Baclofen, Artane, Namenda, Latuda , Lithium ,    GAD-7    Flowsheet Row Office Visit from 12/30/2022 in [facility] [employer] from 10/29/2022 in [facility] [employer] [facility] [employer] Visit from 05/06/2022 in [facility] [employer] from 04/25/2022 in [facility] [place] [facility] [employer] Visit from 04/10/2022 in [facility] [place] [facility] [place]  Total GAD-7 Score 0 16 4 9 17    Mini-Mental    Flowsheet Row Office Visit from 06/07/2019 in [facility]  Total Score (max 30 points ) 29   PHQ2-9    Flowsheet Row Clinical Support from 04/24/2023 in [facility] [place] [facility] [place] [employer] Visit from 12/30/2022 in [facility] [place] [employer] Visit from 10/29/2022 in [facility] [place] [facility] [place] [employer] Visit from 07/15/2022 in [facility] [place] [employer] Visit from 05/06/2022 in [facility] [place]  PHQ-2 Total Score 0 0 2 3 0  PHQ-9 Total Score 0 -- 13 15 0   Flowsheet Row Clinical Support from 04/15/2022 in [facility] [place] [place] [facility] from 04/03/2022 in [facility] Emergency Department at [facility] from 11/26/2021 in [facility] Emergency Department at [facility]  C-SSRS RISK CATEGORY No Risk No Risk No Risk     Review of Systems:  Review of Systems  Musculoskeletal:  Negative for gait problem.  Neurological:  Negative for tremors.  Psychiatric/Behavioral:         Please refer to HPI    Medications: I have reviewed the patient's current medications.  Current Outpatient Medications  Medication Sig Dispense Refill   amLODipine  (NORVASC ) 5 MG tablet TAKE 1 TABLET(5 MG) BY MOUTH DAILY 90 tablet 1   atorvastatin  (LIPITOR) 10 MG tablet Take 0.5 tablets (5 mg total) by mouth daily. 45 tablet 3   augmented betamethasone dipropionate (  DIPROLENE-AF) 0.05 % cream APPLY TO THE AFFECTED AREAS OF LEG TWICE DAILY AS NEEDED FOR RASH. NOT TO FACE, GROIN, UNDERARMS 50 g 0   cariprazine (VRAYLAR) 1.5 MG capsule every other day.     Cholecalciferol  50 MCG (2000 UT) TABS 1 tablet Orally  Once a day for 30 day(s)     Golimumab (SIMPONI ARIA IV) Infusion every 8 weeks     ipratropium (ATROVENT ) 0.03 % nasal spray Place 2 sprays into both nostrils every 12 (twelve) hours. 30 mL 12   levothyroxine  (SYNTHROID ) 75 MCG tablet Take 1 tablet (75 mcg total) by mouth daily. 90 tablet 3   LORazepam  (ATIVAN ) 0.5 MG tablet Take 1 tablet (0.5 mg total) by mouth 2 (two) times daily as needed. 60 tablet 2   metoprolol  succinate (TOPROL  XL) 25 MG 24 hr tablet Take 0.5 tablets (12.5 mg total) by mouth daily. 45 tablet 3   metroNIDAZOLE  (METROGEL ) 0.75 % gel Apply to face 1-2 times daily 45 g 1   mirtazapine  (REMERON ) 30 MG tablet Take 1 tablet (30 mg total) by mouth at bedtime. (Patient taking differently: Take 45 mg by mouth at bedtime. 1/2 tab daily) 90 tablet 0   Omega-3 Fatty Acids (FISH OIL) 1200 MG CAPS Take 1 capsule by mouth in the morning and at bedtime.     Probiotic Product (ALIGN) 4 MG CAPS Take 1 capsule by mouth daily in the afternoon.     No current facility-administered medications for this visit.    Medication Side Effects: None  Allergies:  Allergies  Allergen Reactions   Aripiprazole Other (See Comments)    Parkinsonism      Lactose Intolerance (Gi) Diarrhea   Methotrexate Other (See Comments)    Hair loss, severe stomatitis     Nsaids     CKD 4   Cefdinir Diarrhea    Other reaction(s): Diarrhea Yeast infection and fever; negative c diff   Etanercept Other (See Comments)    Headaches     Exemestane Other (See Comments)    Suicidal thoughts with medication     Fluoxetine Other (See Comments)    Parkinsonism   Methylprednisolone  Sodium Succ Other (See Comments)    Agitated mania   Other Other (See Comments)    Paper tape causes bruising.   Epinephrine  Palpitations    tachycardia    Nitrofurantoin Other (See Comments)     GI upset from 2012    Past Medical History:  Diagnosis Date   Bipolar 1 disorder (HCC)    Chronic diarrhea    loose stools  twice a day on average for years   CKD stage G4/A1, 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 1.4 cm  Grade I invasive lobular, ER and PR +/Her--, Ki 67 <5% Tried anastrozole for one month, but developed suicidal idea   Osteoarthritis, knee 09/24/2019   Xray 09/2019   Parkinsonism (HCC)    Drug induced   Polyposis of colon    Psoriatic arthritis (HCC)    PTSD (post-traumatic stress disorder)    Secondary hyperparathyroidism (HCC)    Tardive dyskinesia    Thyroid  disease     Past Medical History, Surgical history, Social history, and Family history were reviewed and updated as appropriate.   Please see review of systems for  further details on the patient's review from today.   Objective:   Physical Exam:  There were no vitals taken for this visit.  Physical Exam Constitutional:      General: She is not in acute distress. Musculoskeletal:        General: No deformity.  Neurological:     Mental Status: She is alert and oriented to person, place, and time.     Coordination: Coordination normal.  Psychiatric:        Attention and Perception: Attention and perception normal. She does not perceive auditory or visual hallucinations.        Mood and Affect: Mood normal. Mood is not anxious or depressed. Affect is not labile, blunt, angry or inappropriate.        Speech: Speech normal.        Behavior: Behavior normal.        Thought Content: Thought content normal. Thought content is not paranoid or delusional. Thought content does not include homicidal or suicidal ideation. Thought content does not include homicidal or suicidal plan.        Cognition and Memory: Cognition and memory normal.        Judgment: Judgment normal.     Comments: Insight intact     Lab Review:      Component Value Date/Time   NA 139 07/23/2023 1035   K 4.1 07/23/2023 1035   CL 104 07/23/2023 1035   CO2 18 (L) 07/23/2023 1035   GLUCOSE 83 07/23/2023 1035   GLUCOSE 69 (L) 07/12/2022 1108   BUN 31 (H) 07/23/2023 1035   CREATININE 2.43 (H) 07/23/2023 1035   CREATININE 2.29 (H) 05/22/2021 1044   CALCIUM  9.0 07/23/2023 1035   PROT 7.1 07/15/2022 1515   ALBUMIN 4.4 07/15/2022 1515   AST 25 07/15/2022 1515   ALT 29 07/15/2022 1515   ALKPHOS 138 (H) 07/15/2022 1515   BILITOT 0.9 07/15/2022 1515   GFRNONAA 22 (L) 04/03/2022 1425   GFRAA 25 (L) 09/13/2019 1243       Component Value Date/Time   WBC 7.7 07/12/2022 1108   RBC 4.18 07/12/2022 1108   HGB 12.9 07/12/2022 1108   HCT 38.6 07/12/2022 1108   PLT 201.0 07/12/2022 1108   MCV 92.3 07/12/2022 1108   MCH 30.5 04/03/2022 1425   MCHC 33.4 07/12/2022 1108   RDW 13.5 07/12/2022 1108   LYMPHSABS 3.4 07/12/2022 1108   MONOABS 0.9 07/12/2022 1108   EOSABS 0.3 07/12/2022 1108   BASOSABS 0.1 07/12/2022 1108    Lithium  Lvl  Date Value Ref Range Status  05/22/2021 0.7 0.6 - 1.2 mmol/L Final     No results found for: PHENYTOIN, PHENOBARB, VALPROATE, CBMZ   .res Assessment: Plan:    Plan:  Continue: Remeron  22.5 mg hs - reduced from 30mg  daily - will plan to decrease at next visit if mood stable. Vraylar 1.5mg  every other day Lorazepam  0.5mg  TID - only taking one tablet at bedtime.  BMP reviewed - discussed concerns regarding kidney function and plan to consider medication changes.  RTC 4 weeks  25 minutes spent dedicated to the care of this patient on the date of this encounter to include pre-visit review of records, ordering of medication, post visit documentation, and face-to-face time with the patient discussing PTSD, insomnia, GAD and BPD- 1. Discussed continuing current medication regimen.  There are no diagnoses linked to this encounter.   Please see After Visit Summary for patient specific  instructions.  Future Appointments  Date  Time Provider Department Center  11/27/2023  1:00 PM Sahory Nordling Nattalie, NP [facility] None  04/12/2024 11:10 AM Shamleffer, Donell Cardinal, MD [facility] None  05/03/2024  1:40 PM [facility] ANNUAL WELLNESS VISIT 1 [facility]    No orders of the defined types were placed in this encounter.   -------------------------------

## 2023-11-27 ENCOUNTER — Ambulatory Visit (INDEPENDENT_AMBULATORY_CARE_PROVIDER_SITE_OTHER): Admitting: Adult Health

## 2023-11-27 ENCOUNTER — Encounter: Payer: Self-pay | Admitting: Adult Health

## 2023-11-27 DIAGNOSIS — Z79899 Other long term (current) drug therapy: Secondary | ICD-10-CM

## 2023-11-27 DIAGNOSIS — F411 Generalized anxiety disorder: Secondary | ICD-10-CM | POA: Diagnosis not present

## 2023-11-27 DIAGNOSIS — F5105 Insomnia due to other mental disorder: Secondary | ICD-10-CM

## 2023-11-27 DIAGNOSIS — F319 Bipolar disorder, unspecified: Secondary | ICD-10-CM

## 2023-11-27 DIAGNOSIS — F99 Mental disorder, not otherwise specified: Secondary | ICD-10-CM

## 2023-11-27 DIAGNOSIS — F4310 Post-traumatic stress disorder, unspecified: Secondary | ICD-10-CM

## 2023-11-27 MED ORDER — MIRTAZAPINE 15 MG PO TABS: 15.0000 mg | ORAL_TABLET | Freq: Every day | ORAL | 0 refills | Status: DC

## 2023-11-27 NOTE — Progress Notes (Signed)
 Tiffany [name] [id number] 03-10-1951 73 y.o.  Subjective:   Patient ID:  Tiffany [name] is a 73 y.o. (DOB October 29, 1950) female.  Chief Complaint: No chief complaint on file.   HPI Tiffany [name] presents to the office today for follow-up of PTSD, insomnia, GAD and BPD- 1.  Describes mood today as ok. Pleasant. Denies tearfulness. Mood symptoms - denies depression and irritability. Reports anxiety at times. Reports stable interest and motivation. Denies panic attacks. Reports some worry, rumination and over thinking. Denies obsessive thoughts. Denies racing thoughts. Reports mood is stable. Stating I feel like I'm doing alright. Taking medications as prescribed.  Energy levels stable. Active, has a regular exercise routine. Enjoys some usual interests and activities. Married. Lives with husband and daughter. Talking to family and friends.  Appetite adequate - eating healthy - renal diet. Weight loss - 157 from 165 pounds. Sleeps well most nights. Averages 8 or more hours. Denies daytime napping. Reports focus and concentration about the same - needs improvement. Reports she continues to write things down to remember them. Reports reading the bible daily. Completing tasks. Managing some aspects of household. Retired.  Denis SI. Denies HI.  Denies AH or VH. Denies self harm. Denies substance use.  Previous medications: Celexa, Zyprexa, Tegretol, Depakote , Serzone, Topamax, Seroquel, Effexor, Lexapro, Desipramine, Neurontin, Abilify, Geodon, Propanolol, Cymbalta, Cogentin, Trihexyphenadyl, Sinmmet, Provigil, Selegiline, Requip, Amantadine, Prozac, Mirapex, Azilect, Metoclopramide, Baclofen, Artane, Namenda, Latuda , Lithium ,    GAD-7    Flowsheet Row Office Visit from 12/30/2022 in [facility] [employer] from 10/29/2022 in [facility] [employer] [facility] [employer] Visit from 05/06/2022 in [facility] [employer] from 04/25/2022 in [facility] [employer] Visit from 04/10/2022 in [facility] [employer] [facility] [place]  Total GAD-7 Score 0 16 4 9 17    Mini-Mental    Flowsheet Row Office Visit from 06/07/2019 in [facility] Neurology  Total Score (max 30 points ) 29   PHQ2-9    Flowsheet Row Clinical Support from 04/24/2023 in [facility] [employer] Visit from 12/30/2022 in [facility] [place] [facility] [employer] Visit from 10/29/2022 in [facility] [place] [facility] [employer] Visit from 07/15/2022 in [facility] [employer] Visit from 05/06/2022 in [facility] [place] [facility] [place]  PHQ-2 Total Score 0 0 2 3 0  PHQ-9 Total Score 0 -- 13 15 0   Flowsheet Row Clinical Support from 04/15/2022 in [facility] [place] ED from 04/03/2022 in [facility] Emergency Department at [facility] ED from 11/26/2021 in [facility] Emergency Department at [facility]  C-SSRS RISK CATEGORY No Risk No Risk No Risk     Review of Systems:  Review of Systems  Medications: I have reviewed the patient's current medications.  Current Outpatient Medications  Medication Sig Dispense Refill   amLODipine  (NORVASC ) 5 MG tablet TAKE 1 TABLET(5 MG) BY MOUTH DAILY 90 tablet 1   atorvastatin  (LIPITOR) 10 MG tablet Take 0.5 tablets (5 mg total) by mouth daily. 45 tablet 3   augmented betamethasone dipropionate (DIPROLENE-AF) 0.05 % cream APPLY TO THE AFFECTED AREAS OF LEG TWICE DAILY AS NEEDED FOR RASH. NOT TO FACE, GROIN, UNDERARMS 50 g 0   cariprazine (VRAYLAR) 1.5 MG capsule every other day.  Cholecalciferol  50 MCG (2000 UT) TABS 1 tablet Orally Once a day for 30 day(s)     Golimumab (SIMPONI ARIA IV) Infusion every 8 weeks     ipratropium (ATROVENT ) 0.03 % nasal spray Place 2 sprays into both nostrils every 12  (twelve) hours. 30 mL 12   levothyroxine  (SYNTHROID ) 75 MCG tablet Take 1 tablet (75 mcg total) by mouth daily. 90 tablet 3   LORazepam  (ATIVAN ) 0.5 MG tablet Take 1 tablet (0.5 mg total) by mouth 2 (two) times daily as needed. 60 tablet 2   metoprolol  succinate (TOPROL  XL) 25 MG 24 hr tablet Take 0.5 tablets (12.5 mg total) by mouth daily. 45 tablet 2   metroNIDAZOLE  (METROGEL ) 0.75 % gel Apply to face 1-2 times daily 45 g 1   mirtazapine  (REMERON ) 30 MG tablet Take 1 tablet (30 mg total) by mouth at bedtime. (Patient taking differently: Take 45 mg by mouth at bedtime. 1/2 tab daily) 90 tablet 0   Omega-3 Fatty Acids (FISH OIL) 1200 MG CAPS Take 1 capsule by mouth in the morning and at bedtime.     Probiotic Product (ALIGN) 4 MG CAPS Take 1 capsule by mouth daily in the afternoon.     No current facility-administered medications for this visit.    Medication Side Effects: None  Allergies:  Allergies  Allergen Reactions   Aripiprazole Other (See Comments)    Parkinsonism      Lactose Intolerance (Gi) Diarrhea   Methotrexate Other (See Comments)    Hair loss, severe stomatitis     Nsaids     CKD 4   Cefdinir Diarrhea    Other reaction(s): Diarrhea Yeast infection and fever; negative c diff   Etanercept Other (See Comments)    Headaches     Exemestane Other (See Comments)    Suicidal thoughts with medication     Fluoxetine Other (See Comments)    Parkinsonism   Methylprednisolone  Sodium Succ Other (See Comments)    Agitated mania   Other Other (See Comments)    Paper tape causes bruising.   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 1.4 cm  Grade I invasive lobular, ER and PR +/Her--, Ki 67 <5% Tried anastrozole for one month, but developed suicidal idea   Osteoarthritis, knee 09/24/2019   Xray 09/2019   Parkinsonism (HCC)    Drug induced   Polyposis of colon    Psoriatic arthritis (HCC)    PTSD (post-traumatic stress disorder)    Secondary hyperparathyroidism (HCC)    Tardive dyskinesia    Thyroid  disease     Past Medical History, Surgical history, Social history, and Family history were reviewed and updated as appropriate.   Please see review of systems for further details on the patient's review from today.   Objective:   Physical Exam:  There were no vitals taken for this visit.  Physical Exam Constitutional:      General: She is not in acute  distress. Musculoskeletal:        General: No deformity.  Neurological:     Mental Status: She is alert and oriented to person, place, and time.     Coordination: Coordination normal.  Psychiatric:        Attention and Perception: Attention and perception normal. She does not perceive auditory or visual hallucinations.        Mood and Affect: Mood normal. Mood is not anxious or depressed. Affect is not labile, blunt, angry or inappropriate.        Speech: Speech normal.        Behavior: Behavior normal.        Thought Content: Thought content normal. Thought content is not paranoid or delusional. Thought content does not include homicidal or suicidal ideation. Thought content does not include homicidal or suicidal plan.        Cognition and Memory: Cognition and memory normal.        Judgment: Judgment normal.     Comments: Insight intact     Lab Review:     Component Value Date/Time   NA 139 07/23/2023 1035   K 4.1 07/23/2023 1035   CL 104 07/23/2023 1035   CO2 18 (L) 07/23/2023 1035   GLUCOSE 83 07/23/2023 1035   GLUCOSE 69  (L) 07/12/2022 1108   BUN 31 (H) 07/23/2023 1035   CREATININE 2.43 (H) 07/23/2023 1035   CREATININE 2.29 (H) 05/22/2021 1044   CALCIUM  9.0 07/23/2023 1035   PROT 7.1 07/15/2022 1515   ALBUMIN 4.4 07/15/2022 1515   AST 25 07/15/2022 1515   ALT 29 07/15/2022 1515   ALKPHOS 138 (H) 07/15/2022 1515   BILITOT 0.9 07/15/2022 1515   GFRNONAA 22 (L) 04/03/2022 1425   GFRAA 25 (L) 09/13/2019 1243       Component Value Date/Time   WBC 7.7 07/12/2022 1108   RBC 4.18 07/12/2022 1108   HGB 12.9 07/12/2022 1108   HCT 38.6 07/12/2022 1108   PLT 201.0 07/12/2022 1108   MCV 92.3 07/12/2022 1108   MCH 30.5 04/03/2022 1425   MCHC 33.4 07/12/2022 1108   RDW 13.5 07/12/2022 1108   LYMPHSABS 3.4 07/12/2022 1108   MONOABS 0.9 07/12/2022 1108   EOSABS 0.3 07/12/2022 1108   BASOSABS 0.1 07/12/2022 1108    Lithium  Lvl  Date Value Ref Range Status  05/22/2021 0.7 0.6 - 1.2 mmol/L Final     No results found for: PHENYTOIN, PHENOBARB, VALPROATE, CBMZ   .res Assessment: Plan:    Plan:  Continue: Decrease Remeron  15 mg hs to 7.5mg  at hs - will plan to decrease at next visit if mood stable. Vraylar 1.5mg  every other day Lorazepam  0.5mg  TID - only taking one tablet at bedtime.  BMP ordered - patient requesting - concerns regarding kidney function.  RTC 4 weeks  Discussed potential benefits, risk, and side effects of benzodiazepines to include potential risk of tolerance and dependence, as well as possible drowsiness.  Advised patient not to drive if experiencing drowsiness and to take lowest possible effective dose to minimize risk of dependence and tolerance.   25 minutes spent dedicated to the care of this patient on the date of this encounter to include pre-visit review of records, ordering of medication, post visit documentation, and face-to-face time with the patient discussing PTSD, insomnia, GAD and BPD- 1. Discussed continuing current medication regimen.  There are no  diagnoses linked to this encounter.   Please see After Visit Summary for patient specific instructions.  Future Appointments  Date Time Provider Department Center  12/23/2023 11:30 AM Kalvin Buss Nattalie, NP [facility] None  04/12/2024 11:10 AM Shamleffer, Donell Cardinal, MD [facility] None  05/03/2024  1:40 PM [facility] ANNUAL WELLNESS VISIT 1 [facility] [place]    No orders of the defined types were placed in this encounter.   -------------------------------

## 2023-11-28 ENCOUNTER — Other Ambulatory Visit: Payer: Self-pay | Admitting: Adult Health

## 2023-11-28 DIAGNOSIS — F319 Bipolar disorder, unspecified: Secondary | ICD-10-CM

## 2023-11-28 LAB — BASIC METABOLIC PANEL WITH GFR
BUN/Creatinine Ratio: 16 (ref 12–28)
BUN: 38 mg/dL — ABNORMAL HIGH (ref 8–27)
CO2: 19 mmol/L — ABNORMAL LOW (ref 20–29)
Calcium: 9.1 mg/dL (ref 8.7–10.3)
Chloride: 105 mmol/L (ref 96–106)
Creatinine, Ser: 2.35 mg/dL — ABNORMAL HIGH (ref 0.57–1.00)
Glucose: 65 mg/dL — ABNORMAL LOW (ref 70–99)
Potassium: 4.3 mmol/L (ref 3.5–5.2)
Sodium: 140 mmol/L (ref 134–144)
eGFR: 21 mL/min/1.73 — ABNORMAL LOW (ref >59)

## 2023-12-23 ENCOUNTER — Ambulatory Visit: Admitting: Adult Health

## 2023-12-23 ENCOUNTER — Encounter: Payer: Self-pay | Admitting: Adult Health

## 2023-12-23 DIAGNOSIS — F411 Generalized anxiety disorder: Secondary | ICD-10-CM | POA: Diagnosis not present

## 2023-12-23 DIAGNOSIS — F5105 Insomnia due to other mental disorder: Secondary | ICD-10-CM

## 2023-12-23 DIAGNOSIS — F99 Mental disorder, not otherwise specified: Secondary | ICD-10-CM

## 2023-12-23 DIAGNOSIS — F4310 Post-traumatic stress disorder, unspecified: Secondary | ICD-10-CM

## 2023-12-23 DIAGNOSIS — F319 Bipolar disorder, unspecified: Secondary | ICD-10-CM | POA: Diagnosis not present

## 2023-12-23 NOTE — Progress Notes (Signed)
 Tiffany [name] [id number] 11-25-1950 73 y.o.  Subjective:   Patient ID:  Tiffany [name] is a 73 y.o. (DOB 1950-06-10) female.  Chief Complaint: No chief complaint on file.   HPI Tiffany [name] presents to the office today for follow-up of PTSD, insomnia, GAD and BPD- 1.  Describes mood today as ok. Pleasant. Denies tearfulness. Mood symptoms - denies depression and irritability. Reports anxiety at times - it comes and goes. Reports stable interest and motivation. Denies panic attacks. Reports some worry, rumination and over thinking. Denies obsessive thoughts. Denies racing thoughts. Reports mood is stable. Stating I feel like I'm doing ok. Taking medications as prescribed.  Energy levels stable. Active, has a regular exercise routine. Enjoys some usual interests and activities. Getting out more. Married. Lives with husband and daughter. Talking to family and friends.  Appetite adequate - eating healthy - renal diet. Weight loss - 155 pounds. Sleeps well most nights. Averages 10 hours. Denies daytime napping. Reports focus and concentration better. Reports she is able to keep up with tasks. Reports reading daily. Managing aspects of household.  Denis SI. Denies HI.  Denies AH or VH. Denies self harm. Denies substance use.  Previous medications: Celexa, Zyprexa, Tegretol, Depakote , Serzone, Topamax, Seroquel, Effexor, Lexapro, Desipramine, Neurontin, Abilify, Geodon, Propanolol, Cymbalta, Cogentin, Trihexyphenadyl, Sinmmet, Provigil, Selegiline, Requip, Amantadine, Prozac, Mirapex, Azilect, Metoclopramide, Baclofen, Artane, Namenda, Latuda , Lithium ,      GAD-7    Flowsheet Row Office Visit from 12/30/2022 in [facility] [place] [facility] [employer] from 10/29/2022 in [facility] [employer] [facility] [employer] Visit from 05/06/2022 in [facility] [place] [facility] [employer] from 04/25/2022 in [facility] [employer] Visit from 04/10/2022 in [facility] [place]  Total GAD-7 Score 0 16 4 9 17    Mini-Mental    Flowsheet Row Office Visit from 06/07/2019 in [facility] Neurology  Total Score (max 30 points ) 29   PHQ2-9    Flowsheet Row Clinical Support from 04/24/2023 in [facility] [employer] Visit from 12/30/2022 in [facility] [employer] Visit from 10/29/2022 in [facility] [place] [facility] [employer] Visit from 07/15/2022 in [facility] [employer] Visit from 05/06/2022 in [facility] [place] [facility] [place]  PHQ-2 Total Score 0 0 2 3 0  PHQ-9 Total Score 0 -- 13 15 0   Flowsheet Row Clinical Support from 04/15/2022 in [facility] [place] ED from 04/03/2022 in [facility] Emergency Department at [facility] ED from 11/26/2021 in [facility] Department at [facility]  C-SSRS RISK CATEGORY No Risk No Risk No Risk     Review of Systems:  Review of Systems  Musculoskeletal:  Negative for gait problem.  Neurological:  Negative for tremors.  Psychiatric/Behavioral:         Please refer to HPI    Medications: I have reviewed the patient's current medications.  Current Outpatient Medications  Medication Sig Dispense Refill   amLODipine  (NORVASC ) 5 MG tablet TAKE 1 TABLET(5 MG) BY MOUTH DAILY 90 tablet 1   atorvastatin  (LIPITOR) 10 MG tablet Take 0.5 tablets (5 mg total) by mouth daily. 45 tablet 3   augmented betamethasone dipropionate (DIPROLENE-AF) 0.05 % cream APPLY TO THE AFFECTED AREAS OF LEG TWICE  DAILY AS NEEDED FOR RASH. NOT TO FACE, GROIN, UNDERARMS 50 g 0   cariprazine (VRAYLAR) 1.5 MG capsule every other day.     Cholecalciferol  50 MCG (2000 UT) TABS 1 tablet Orally Once a day for 30 day(s)     Golimumab (SIMPONI ARIA IV) Infusion every 8 weeks      ipratropium (ATROVENT ) 0.03 % nasal spray Place 2 sprays into both nostrils every 12 (twelve) hours. 30 mL 12   levothyroxine  (SYNTHROID ) 75 MCG tablet Take 1 tablet (75 mcg total) by mouth daily. 90 tablet 3   LORazepam  (ATIVAN ) 0.5 MG tablet Take 1 tablet (0.5 mg total) by mouth 2 (two) times daily as needed. 60 tablet 2   metoprolol  succinate (TOPROL  XL) 25 MG 24 hr tablet Take 0.5 tablets (12.5 mg total) by mouth daily. 45 tablet 2   metroNIDAZOLE  (METROGEL ) 0.75 % gel Apply to face 1-2 times daily 45 g 1   mirtazapine  (REMERON ) 15 MG tablet Take 1 tablet (15 mg total) by mouth at bedtime. 90 tablet 0   Omega-3 Fatty Acids (FISH OIL) 1200 MG CAPS Take 1 capsule by mouth in the morning and at bedtime.     Probiotic Product (ALIGN) 4 MG CAPS Take 1 capsule by mouth daily in the afternoon.     No current facility-administered medications for this visit.    Medication Side Effects: None  Allergies:  Allergies  Allergen Reactions   Aripiprazole Other (See Comments)    Parkinsonism      Lactose Intolerance (Gi) Diarrhea   Methotrexate Other (See Comments)    Hair loss, severe stomatitis     Nsaids     CKD 4   Cefdinir Diarrhea    Other reaction(s): Diarrhea Yeast infection and fever; negative c diff   Etanercept Other (See Comments)    Headaches     Exemestane Other (See Comments)    Suicidal thoughts with medication     Fluoxetine Other (See Comments)    Parkinsonism   Methylprednisolone  Sodium Succ Other (See Comments)    Agitated mania   Other Other (See Comments)    Paper tape causes bruising.   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 1.4 cm  Grade I invasive lobular, ER and PR +/Her--, Ki 67 <5% Tried anastrozole for one month, but developed suicidal idea   Osteoarthritis, knee 09/24/2019   Xray 09/2019   Parkinsonism (HCC)    Drug induced   Polyposis of colon    Psoriatic arthritis (HCC)    PTSD (post-traumatic stress disorder)    Secondary hyperparathyroidism    Tardive dyskinesia    Thyroid  disease     Past Medical History, Surgical history, Social history, and Family history were reviewed and updated as appropriate.   Please see review of systems for further details on the patient's review from today.   Objective:   Physical Exam:  There were no vitals taken for this visit.  Physical  Exam Neurological:     Mental Status: She is alert and oriented to person, place, and time.     Cranial Nerves: No dysarthria.  Psychiatric:        Attention and Perception: Attention and perception normal.        Mood and Affect: Mood normal.        Speech: Speech normal.        Behavior: Behavior is cooperative.        Thought Content: Thought content normal. Thought content is not paranoid or delusional. Thought content does not include homicidal or suicidal ideation. Thought content does not include homicidal or suicidal plan.        Cognition and Memory: Cognition and memory normal.        Judgment: Judgment normal.     Comments: Insight intact     Lab Review:     Component Value Date/Time   NA 140 11/27/2023 1424   K 4.3 11/27/2023 1424   CL 105 11/27/2023 1424   CO2 19 (L) 11/27/2023 1424   GLUCOSE 65 (L) 11/27/2023 1424   GLUCOSE 69 (L) 07/12/2022 1108   BUN 38 (H) 11/27/2023 1424   CREATININE 2.35 (H) 11/27/2023 1424   CREATININE 2.29 (H) 05/22/2021 1044   CALCIUM  9.1 11/27/2023 1424   PROT 7.1 07/15/2022 1515   ALBUMIN 4.4 07/15/2022 1515   AST 25 07/15/2022 1515    ALT 29 07/15/2022 1515   ALKPHOS 138 (H) 07/15/2022 1515   BILITOT 0.9 07/15/2022 1515   GFRNONAA 22 (L) 04/03/2022 1425   GFRAA 25 (L) 09/13/2019 1243       Component Value Date/Time   WBC 7.7 07/12/2022 1108   RBC 4.18 07/12/2022 1108   HGB 12.9 07/12/2022 1108   HCT 38.6 07/12/2022 1108   PLT 201.0 07/12/2022 1108   MCV 92.3 07/12/2022 1108   MCH 30.5 04/03/2022 1425   MCHC 33.4 07/12/2022 1108   RDW 13.5 07/12/2022 1108   LYMPHSABS 3.4 07/12/2022 1108   MONOABS 0.9 07/12/2022 1108   EOSABS 0.3 07/12/2022 1108   BASOSABS 0.1 07/12/2022 1108    Lithium  Lvl  Date Value Ref Range Status  05/22/2021 0.7 0.6 - 1.2 mmol/L Final     No results found for: PHENYTOIN, PHENOBARB, VALPROATE, CBMZ   .res Assessment: Plan:    Plan:  Continue: Remeron  15 mg hs - 2 months then will decrease to 7.5mg  for 2 months Vraylar 1.5mg  every other day - samples given  Lorazepam  0.5mg  TID - only taking one tablet at bedtime.  RTC 4 weeks  Discussed potential benefits, risk, and side effects of benzodiazepines to include potential risk of tolerance and dependence, as well as possible drowsiness.  Advised patient not to drive if experiencing drowsiness and to take lowest possible effective dose to minimize risk of dependence and tolerance.   25 minutes spent dedicated to the care of this patient on the date of this encounter to include pre-visit review of records, ordering of medication, post visit documentation, and face-to-face time with the patient discussing PTSD, insomnia, GAD and BPD- 1. Discussed continuing current medication regimen.  There are no diagnoses linked to this encounter.   Please see After Visit Summary for patient specific instructions.  Future Appointments  Date Time Provider Department Center  01/30/2024 12:00 PM Randalyn Ahmed Nattalie, NP CP[facility] None  04/12/2024 11:10 AM Shamleffer, Donell Cardinal, MD [facility] None  05/03/2024  1:40 PM [facility] ANNUAL  WELLNESS VISIT 1 [facility] [place]  No orders of the defined types were placed in this encounter.   -------------------------------

## 2024-01-02 ENCOUNTER — Other Ambulatory Visit: Payer: Self-pay | Admitting: Physician Assistant

## 2024-01-30 ENCOUNTER — Ambulatory Visit: Admitting: Adult Health

## 2024-01-30 ENCOUNTER — Encounter: Payer: Self-pay | Admitting: Adult Health

## 2024-01-30 DIAGNOSIS — F5105 Insomnia due to other mental disorder: Secondary | ICD-10-CM | POA: Diagnosis not present

## 2024-01-30 DIAGNOSIS — F411 Generalized anxiety disorder: Secondary | ICD-10-CM | POA: Diagnosis not present

## 2024-01-30 DIAGNOSIS — F319 Bipolar disorder, unspecified: Secondary | ICD-10-CM | POA: Diagnosis not present

## 2024-01-30 DIAGNOSIS — F4310 Post-traumatic stress disorder, unspecified: Secondary | ICD-10-CM | POA: Diagnosis not present

## 2024-01-30 DIAGNOSIS — F99 Mental disorder, not otherwise specified: Secondary | ICD-10-CM

## 2024-01-30 NOTE — Progress Notes (Signed)
 Tiffany [name] [id number] November 29, 1950 73 y.o.  Subjective:   Patient ID:  Tiffany [name] is a 73 y.o. (DOB 1950/06/12) female.  Chief Complaint: No chief complaint on file.   HPI Shritha Bresee Mosqueda presents to the office today for follow-up of PTSD, insomnia, GAD and BPD- 1.  Describes mood today as ok. Pleasant. Reports tearfulness at times. Mood symptoms - denies depression and irritability. Reports anxiety at times - it comes and goes. Reports stable interest and motivation. Denies panic attacks. Reports some worry, rumination and over thinking. Denies obsessive thoughts. Denies racing thoughts. Reports mood is stable. Stating I feel like I'm doing alright. Taking medications as prescribed.  Energy levels stable. Active, has a regular exercise routine. Enjoys some usual interests and activities. Getting out more. Married. Lives with husband and daughter. Talking to family and friends.  Appetite adequate - eating healthy - renal diet. Weight loss - 153 pounds. Sleeps well most nights. Averages 10 hours. Denies daytime napping. Reports focus and concentration it could be improved. Reports she is able to keep up with tasks. Reports reading daily. Managing aspects of household.  Denis SI. Denies HI.  Denies AH  Reports VH - movement of clouds in bedroom at night. Denies self harm. Denies substance use.  Previous medications: Celexa, Zyprexa, Tegretol, Depakote , Serzone, Topamax, Seroquel, Effexor, Lexapro, Desipramine, Neurontin, Abilify, Geodon, Propanolol, Cymbalta, Cogentin, Trihexyphenadyl, Sinmmet, Provigil, Selegiline, Requip, Amantadine, Prozac, Mirapex, Azilect, Metoclopramide, Baclofen, Artane, Namenda, Latuda , Lithium ,    GAD-7    Flowsheet Row Office Visit from 12/30/2022 in [facility] [employer] from 10/29/2022 in [facility] [employer] [facility] [employer] Visit from 05/06/2022 in [facility]  [employer] from 04/25/2022 in [facility] [employer] Visit from 04/10/2022 in [facility] [place] [facility] [place]  Total GAD-7 Score 0 16 4 9 17    Mini-Mental    Flowsheet Row Office Visit from 06/07/2019 in [facility] Neurology  Total Score (max 30 points ) 29   PHQ2-9    Flowsheet Row Clinical Support from 04/24/2023 in [facility] [employer] Visit from 12/30/2022 in [facility] [employer] Visit from 10/29/2022 in [facility] [employer] Visit from 07/15/2022 in [facility] [employer] Visit from 05/06/2022 in [facility] [place]  PHQ-2 Total Score 0 0 2 3 0  PHQ-9 Total Score 0 -- 13 15 0   Flowsheet Row Clinical Support from 04/15/2022 in [facility] [place] [facility] [place] [facility] from 04/03/2022 in [facility] Emergency Department at [facility] ED from 11/26/2021 in [facility] Emergency Department at [facility]  C-SSRS RISK CATEGORY No Risk No Risk No Risk     Review of Systems:  Review of Systems  Musculoskeletal:  Negative for gait problem.  Neurological:  Negative for tremors.  Psychiatric/Behavioral:         Please refer to HPI    Medications: I have reviewed the patient's current medications.  Current Outpatient Medications  Medication Sig Dispense Refill   amLODipine  (NORVASC ) 5 MG tablet TAKE 1 TABLET(5 MG) BY MOUTH DAILY 90 tablet 1   atorvastatin  (LIPITOR) 10 MG tablet Take 0.5 tablets (5 mg total) by mouth daily. 45 tablet 3   augmented betamethasone dipropionate (  DIPROLENE-AF) 0.05 % cream APPLY TO THE AFFECTED AREAS OF LEG TWICE DAILY AS NEEDED FOR RASH. NOT TO FACE, GROIN, UNDERARMS 50 g 0   cariprazine (VRAYLAR) 1.5 MG capsule every other day.     Cholecalciferol  50 MCG (2000 UT) TABS 1 tablet Orally Once a day for 30 day(s)      Golimumab (SIMPONI ARIA IV) Infusion every 8 weeks     ipratropium (ATROVENT ) 0.03 % nasal spray Place 2 sprays into both nostrils every 12 (twelve) hours. 30 mL 12   levothyroxine  (SYNTHROID ) 75 MCG tablet Take 1 tablet (75 mcg total) by mouth daily. 90 tablet 3   LORazepam  (ATIVAN ) 0.5 MG tablet Take 1 tablet (0.5 mg total) by mouth 2 (two) times daily as needed. 60 tablet 2   metoprolol  succinate (TOPROL  XL) 25 MG 24 hr tablet Take 0.5 tablets (12.5 mg total) by mouth daily. 45 tablet 2   metroNIDAZOLE  (METROGEL ) 0.75 % gel Apply to face 1-2 times daily 45 g 1   mirtazapine  (REMERON ) 15 MG tablet Take 1 tablet (15 mg total) by mouth at bedtime. 90 tablet 0   Omega-3 Fatty Acids (FISH OIL) 1200 MG CAPS Take 1 capsule by mouth in the morning and at bedtime.     Probiotic Product (ALIGN) 4 MG CAPS Take 1 capsule by mouth daily in the afternoon.     No current facility-administered medications for this visit.    Medication Side Effects: None  Allergies:  Allergies  Allergen Reactions   Aripiprazole Other (See Comments)    Parkinsonism      Lactose Intolerance (Gi) Diarrhea   Methotrexate Other (See Comments)    Hair loss, severe stomatitis     Nsaids     CKD 4   Cefdinir Diarrhea    Other reaction(s): Diarrhea Yeast infection and fever; negative c diff   Etanercept Other (See Comments)    Headaches     Exemestane Other (See Comments)    Suicidal thoughts with medication     Fluoxetine Other (See Comments)    Parkinsonism   Methylprednisolone  Sodium Succ Other (See Comments)    Agitated mania   Other Other (See Comments)    Paper tape causes bruising.   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 1.4 cm  Grade I invasive lobular, ER and PR +/Her--, Ki 67 <5% Tried anastrozole for one month, but developed suicidal idea   Osteoarthritis, knee 09/24/2019   Xray 09/2019   Parkinsonism (HCC)    Drug induced   Polyposis of colon    Psoriatic arthritis (HCC)    PTSD (post-traumatic stress disorder)    Secondary hyperparathyroidism    Tardive dyskinesia    Thyroid  disease     Past Medical History, Surgical history, Social history, and Family history were reviewed and updated as appropriate.   Please see review of systems for further details on the patient's review from today.   Objective:   Physical  Exam:  There were no vitals taken for this visit.  Physical Exam Constitutional:      General: She is not in acute distress. Musculoskeletal:        General: No deformity.  Neurological:     Mental Status: She is alert and oriented to person, place, and time.     Coordination: Coordination normal.  Psychiatric:        Attention and Perception: Attention and perception normal. She does not perceive auditory or visual hallucinations.        Mood and Affect: Mood normal. Mood is not anxious or depressed. Affect is not labile, blunt, angry or inappropriate.        Speech: Speech normal.        Behavior: Behavior normal.        Thought Content: Thought content normal. Thought content is not paranoid or delusional. Thought content does not include homicidal or suicidal ideation. Thought content does not include homicidal or suicidal plan.        Cognition and Memory: Cognition and memory normal.        Judgment: Judgment normal.     Comments: Insight intact     Lab Review:     Component Value Date/Time   NA 140 11/27/2023 1424   K 4.3 11/27/2023 1424   CL 105 11/27/2023 1424   CO2  19 (L) 11/27/2023 1424   GLUCOSE 65 (L) 11/27/2023 1424   GLUCOSE 69 (L) 07/12/2022 1108   BUN 38 (H) 11/27/2023 1424   CREATININE 2.35 (H) 11/27/2023 1424   CREATININE 2.29 (H) 05/22/2021 1044   CALCIUM  9.1 11/27/2023 1424   PROT 7.1 07/15/2022 1515   ALBUMIN 4.4 07/15/2022 1515   AST 25 07/15/2022 1515   ALT 29 07/15/2022 1515   ALKPHOS 138 (H) 07/15/2022 1515   BILITOT 0.9 07/15/2022 1515   GFRNONAA 22 (L) 04/03/2022 1425   GFRAA 25 (L) 09/13/2019 1243       Component Value Date/Time   WBC 7.7 07/12/2022 1108   RBC 4.18 07/12/2022 1108   HGB 12.9 07/12/2022 1108   HCT 38.6 07/12/2022 1108   PLT 201.0 07/12/2022 1108   MCV 92.3 07/12/2022 1108   MCH 30.5 04/03/2022 1425   MCHC 33.4 07/12/2022 1108   RDW 13.5 07/12/2022 1108   LYMPHSABS 3.4 07/12/2022 1108   MONOABS 0.9 07/12/2022 1108   EOSABS 0.3 07/12/2022 1108   BASOSABS 0.1 07/12/2022 1108    Lithium  Lvl  Date Value Ref Range Status  05/22/2021 0.7 0.6 - 1.2 mmol/L Final     No results found for: PHENYTOIN, PHENOBARB, VALPROATE, CBMZ   .res Assessment: Plan:    Plan:  Continue: Remeron  7.5mg  for 2 months Vraylar 1.5mg  every other day - samples given  Lorazepam  0.5mg  TID - only taking one tablet at bedtime.  RTC 4 weeks  Discussed potential benefits, risk, and side effects of benzodiazepines to include potential risk of tolerance and dependence, as well as possible drowsiness.  Advised patient not to drive if experiencing drowsiness and to take lowest possible effective dose to minimize risk of dependence and tolerance.   25 minutes spent dedicated to the care of this patient on the date of this encounter to include pre-visit review of records, ordering of medication, post visit documentation, and face-to-face time with the patient discussing PTSD, insomnia, GAD and BPD- 1. Discussed continuing current medication regimen.  There are no diagnoses linked to this encounter.   Please see After Visit  Summary for  patient specific instructions.  Future Appointments  Date Time Provider Department Center  01/30/2024 12:00 PM Miri Jose, Angeline Mattocks, NP [facility] None  02/24/2024  1:30 PM Alias Villagran Nattalie, NP [facility] None  04/09/2024 11:00 AM Okey Vina GAILS, MD [facility]  04/12/2024 11:10 AM Shamleffer, Donell Cardinal, MD [facility] None  05/03/2024  1:40 PM [facility] ANNUAL WELLNESS VISIT 1 [facility] [place]    No orders of the defined types were placed in this encounter.   -------------------------------

## 2024-02-16 ENCOUNTER — Ambulatory Visit: Admitting: Physician Assistant

## 2024-02-16 VITALS — BP 120/76 | HR 67 | Temp 97.8°F | Ht 64.0 in | Wt 156.8 lb

## 2024-02-16 DIAGNOSIS — L989 Disorder of the skin and subcutaneous tissue, unspecified: Secondary | ICD-10-CM

## 2024-02-16 DIAGNOSIS — R059 Cough, unspecified: Secondary | ICD-10-CM

## 2024-02-16 LAB — POC COVID19 BINAXNOW: SARS Coronavirus 2 Ag: NEGATIVE

## 2024-02-16 MED ORDER — DOXYCYCLINE HYCLATE 100 MG PO TABS: 100.0000 mg | ORAL_TABLET | Freq: Two times a day (BID) | ORAL | 0 refills | Status: AC

## 2024-02-16 NOTE — Progress Notes (Signed)
 History of Present Illness:   Chief Complaint  Patient presents with   Cough   Nasal Congestion    Cough and congestion started about 8 days ago. Having a lot of fatigue. No fever. No body aches. No headaches. Also has a sore on the right are that won't heal. Has had it for over a year. She sleeps on that side and its painful.    Upper respiratory infection (URI) Symptom(s) started about 8 days ago Has cough and nasal congestion, fatigue No fever, has had clear runny nose No travel or known sick contacts Coughing up green and yellow at times Has not taken anything for symptom(s) other then pushing fluids and resting Has intentionally lost weight since our last office visit   Lesion on R ear Has persistent lesion to auricle of R ear Recurrent crusting and irritation Has tried antibiotic(s) ointment Does not have dermatology   Past Medical History:  Diagnosis Date   Bipolar 1 disorder (HCC)    Cataract 02/09/2019   Chronic diarrhea    loose stools twice a day on average for years   CKD stage G4/A1, 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 1.4 cm  Grade I invasive lobular, ER and PR +/Her--, Ki 67 <5% Tried anastrozole for one month, but developed suicidal idea   Osteoarthritis, knee 09/24/2019   Xray 09/2019   Parkinsonism (HCC)    Drug induced   Polyposis of colon    Psoriatic arthritis (HCC)    PTSD (post-traumatic stress disorder)    Secondary hyperparathyroidism    Seizures (HCC) 04/23/2019   diagnosed with epilepsy   Tardive dyskinesia    Thyroid  disease 2004   hypothyroidism     Social History   Tobacco Use   Smoking status: Never   Smokeless tobacco: Never  Vaping Use   Vaping status: Never Used   Substance Use Topics   Alcohol use: Never   Drug use: Never    Past Surgical History:  Procedure Laterality Date   ABDOMINAL HYSTERECTOMY  1987   for prolapsed uterus   BREAST SURGERY  09/19/2014   bilateral mastectomy   CARDIAC CATHETERIZATION  approximately 1975   uncertain of reason   CHOLECYSTECTOMY  1979   DILATION AND CURETTAGE OF UTERUS  1973   EYE SURGERY  09/07/2020, 09/21/2020   cataract surgery   MASTECTOMY Bilateral 09/19/2014   TONSILLECTOMY  1970   URETERAL REIMPLANTION Bilateral 1974    Family History  Problem Relation Age of Onset   Lymphoma Mother 42   Cancer Mother    Lymphoma Sister 87   Breast cancer Sister 74   Cancer Sister    Colon polyps Sister    Lung cancer Sister        former smoker   Cancer Sister    Diabetes Sister    Obesity Sister    Stroke Maternal Grandfather    Diabetes Paternal Grandfather    Colon cancer Neg Hx    Esophageal cancer Neg Hx    Stomach cancer Neg Hx    Rectal cancer Neg Hx     Allergies  Allergen Reactions   Aripiprazole Other (See Comments)    Parkinsonism      Lactose Intolerance (  Gi) Diarrhea   Methotrexate Other (See Comments)    Hair loss, severe stomatitis     Nsaids     CKD 4   Cefdinir Diarrhea    Other reaction(s): Diarrhea Yeast infection and fever; negative c diff   Etanercept Other (See Comments)    Headaches     Exemestane Other (See Comments)    Suicidal thoughts with medication     Fluoxetine Other (See Comments)    Parkinsonism   Methylprednisolone  Sodium Succ Other (See Comments)    Agitated mania   Other Other (See Comments)    Paper tape causes bruising.   Epinephrine  Palpitations    tachycardia    Nitrofurantoin Other (See Comments)     GI upset from 2012    Current Medications:   Current Outpatient Medications:    amLODipine  (NORVASC ) 5 MG tablet, TAKE 1 TABLET(5 MG) BY MOUTH DAILY, Disp: 90 tablet, Rfl: 1   atorvastatin  (LIPITOR) 10 MG tablet, Take 0.5 tablets  (5 mg total) by mouth daily., Disp: 45 tablet, Rfl: 3   betamethasone dipropionate 0.05 % cream, Apply 1 Application topically., Disp: , Rfl:    cariprazine (VRAYLAR) 1.5 MG capsule, every other day., Disp: , Rfl:    Cholecalciferol  (VITAMIN D3) 50 MCG (2000 UT) TABS, Take 2,000 mcg by mouth daily., Disp: , Rfl:    Golimumab (SIMPONI ARIA IV), Infusion every 8 weeks, Disp: , Rfl:    ipratropium (ATROVENT ) 0.03 % nasal spray, Place 2 sprays into both nostrils every 12 (twelve) hours., Disp: 30 mL, Rfl: 12   levothyroxine  (SYNTHROID ) 75 MCG tablet, Take 1 tablet (75 mcg total) by mouth daily., Disp: 90 tablet, Rfl: 3   LORazepam  (ATIVAN ) 0.5 MG tablet, Take 1 tablet (0.5 mg total) by mouth 2 (two) times daily as needed., Disp: 60 tablet, Rfl: 2   metoprolol  succinate (TOPROL  XL) 25 MG 24 hr tablet, Take 0.5 tablets (12.5 mg total) by mouth daily., Disp: 45 tablet, Rfl: 2   metroNIDAZOLE  (METROGEL ) 0.75 % gel, Apply to face 1-2 times daily, Disp: 45 g, Rfl: 1   mirtazapine  (REMERON ) 15 MG tablet, Take 1 tablet (15 mg total) by mouth at bedtime., Disp: 90 tablet, Rfl: 0   augmented betamethasone dipropionate (DIPROLENE-AF) 0.05 % cream, APPLY TO THE AFFECTED AREAS OF LEG TWICE DAILY AS NEEDED FOR RASH. NOT TO FACE, GROIN, UNDERARMS (Patient not taking: Reported on 02/16/2024), Disp: 50 g, Rfl: 0   Omega-3 Fatty Acids (FISH OIL) 1200 MG CAPS, Take 1 capsule by mouth in the morning and at bedtime. (Patient not taking: Reported on 02/16/2024), Disp: , Rfl:    Probiotic Product (ALIGN) 4 MG CAPS, Take 1 capsule by mouth daily in the afternoon. (Patient not taking: Reported on 02/16/2024), Disp: , Rfl:    Review of Systems:   Negative unless otherwise specified per HPI.  Vitals:   Vitals:   02/16/24 1034  BP: 120/76  Pulse: 67  Temp: 97.8 F (36.6 C)  TempSrc: Axillary  SpO2: 98%  Weight: 156 lb 12.8 oz (71.1 kg)  Height: 5' 4 (1.626 m)     Body mass index is 26.91 kg/m.  Physical Exam:    Physical Exam Vitals and nursing note reviewed.  Constitutional:      General: She is not in acute distress.    Appearance: She is well-developed. She is not ill-appearing or toxic-appearing.  HENT:     Head: Normocephalic and atraumatic.     Right Ear: Ear canal and external ear  normal. A middle ear effusion is present. Tympanic membrane is not erythematous, retracted or bulging.     Left Ear: Ear canal and external ear normal. A middle ear effusion is present. Tympanic membrane is not erythematous, retracted or bulging.     Ears:      Nose: Nose normal.     Right Sinus: No maxillary sinus tenderness or frontal sinus tenderness.     Left Sinus: No maxillary sinus tenderness or frontal sinus tenderness.     Mouth/Throat:     Pharynx: Uvula midline. No posterior oropharyngeal erythema.  Eyes:     General: Lids are normal.     Conjunctiva/sclera: Conjunctivae normal.  Neck:     Trachea: Trachea normal.  Cardiovascular:     Rate and Rhythm: Normal rate and regular rhythm.     Heart sounds: Normal heart sounds, S1 normal and S2 normal.  Pulmonary:     Effort: Pulmonary effort is normal.     Breath sounds: Normal breath sounds. No decreased breath sounds, wheezing, rhonchi or rales.  Lymphadenopathy:     Cervical: No cervical adenopathy.  Skin:    General: Skin is warm and dry.  Neurological:     Mental Status: She is alert.  Psychiatric:        Speech: Speech normal.        Behavior: Behavior normal. Behavior is cooperative.      Results for orders placed or performed in visit on 02/16/24  POC COVID-19 [employer]  Result Value Ref Range   SARS Coronavirus 2 Ag Negative Negative    Assessment and Plan:    Cough, unspecified type No red flags on discussion, patient is not in any obvious distress during our visit. Discussed progression of most viral illness, and recommended supportive care at this point in time. I did however provide pocket rx for oral doxycycline  should  symptoms not improve as anticipated. Discussed over the counter supportive care options, with recommendations to push fluids and rest. Reviewed return precautions including new/worsening fever, SOB, new/worsening cough or other concerns.  Recommended need to self-quarantine and practice social distancing until symptoms resolve. COVID testing negative I recommend that patient follow-up if symptoms worsen or persist despite treatment x 7-10 days, sooner if needed.  Skin lesion Due to recurrent issue, recommend close follow up with dermatology I have placed referral to [facility] today     Lucie Buttner, PA-C

## 2024-02-24 ENCOUNTER — Encounter: Payer: Self-pay | Admitting: Adult Health

## 2024-02-24 ENCOUNTER — Ambulatory Visit (INDEPENDENT_AMBULATORY_CARE_PROVIDER_SITE_OTHER): Admitting: Adult Health

## 2024-02-24 DIAGNOSIS — F411 Generalized anxiety disorder: Secondary | ICD-10-CM | POA: Diagnosis not present

## 2024-02-24 DIAGNOSIS — F319 Bipolar disorder, unspecified: Secondary | ICD-10-CM

## 2024-02-24 DIAGNOSIS — F4310 Post-traumatic stress disorder, unspecified: Secondary | ICD-10-CM

## 2024-02-24 DIAGNOSIS — Z79899 Other long term (current) drug therapy: Secondary | ICD-10-CM | POA: Diagnosis not present

## 2024-02-24 NOTE — Progress Notes (Signed)
 Tiffany [name] [id number] Sep 19, 1950 73 y.o.  Subjective:   Patient ID:  Tiffany [name] is a 73 y.o. (DOB [birth year]) female.  Chief Complaint: No chief complaint on file.   HPI Tiffany [name] presents to the office today for follow-up of PTSD, insomnia, GAD and BPD- 1.  Describes mood today as ok. Pleasant. Reports tearfulness at times. Mood symptoms - denies anxiety, depression and irritability. Reports stable interest and motivation. Denies panic attacks. Reports some worry, rumination and over thinking. Denies obsessive thoughts. Denies racing thoughts. Reports mood is stable. Stating I feel like I'm doing ok. Taking medications as prescribed.  Energy levels stable. Active, has a regular exercise routine. Enjoys some usual interests and activities. Married. Lives with husband and daughter. Talking to family and friends.  Appetite adequate - eating healthy - renal diet. Weight loss - 153 pounds. Sleeps well most nights. Averages 10+ hours. Denies daytime napping. Reports improved focus and concentration. Reports she is able to keep up with tasks. Reports reading daily. Managing aspects of household.  Denis SI. Denies HI.  Denies AH  Reports VH - movement of clouds in bedroom at night. Denies self harm. Denies substance use.  Previous medications: Celexa, Zyprexa, Tegretol, Depakote , Serzone, Topamax, Seroquel, Effexor, Lexapro, Desipramine, Neurontin, Abilify, Geodon, Propanolol, Cymbalta, Cogentin, Trihexyphenadyl, Sinmmet, Provigil, Selegiline, Requip, Amantadine, Prozac, Mirapex, Azilect, Metoclopramide, Baclofen, Artane, Namenda, Latuda , Lithium ,    GAD-7    Flowsheet Row Office Visit from 02/16/2024 in [facility] [employer] from 12/30/2022 in [facility] [employer] Visit from 10/29/2022 in [facility] [employer] [facility] [employer] from 05/06/2022 in [facility] [place] [facility] [employer] Visit from 04/25/2022 in [facility] [place]  Total GAD-7 Score 2 0 16 4 9    Mini-Mental    Flowsheet Row Office Visit from 06/07/2019 in [facility] Neu[facility]  Total Score (max 30 points ) 29   PHQ2-9    Flowsheet Row Office Visit from 02/16/2024 in [facility] [place] [facility] [place] Clinical Support from 04/24/2023 in [facility] [employer] Visit from 12/30/2022 in [facility] [employer] Visit from 10/29/2022 in [facility] [place] [facility] [employer] Visit from 07/15/2022 in [facility] [place]  PHQ-2 Total Score 0 0 0 2 3  PHQ-9 Total Score 2 0 -- 13 15   Flowsheet Row Clinical Support from 04/15/2022 in [facility] [place] [facility] from 04/03/2022 in [facility] Emergency Department at [facility] ED from 11/26/2021 in [facility] Emergency Department at [facility]  C-SSRS RISK CATEGORY No Risk No Risk No Risk     Review of Systems:  Review of Systems  Musculoskeletal:  Negative for gait problem.  Neurological:  Negative for tremors.  Psychiatric/Behavioral:         Please refer to HPI    Medications: I have reviewed the patient's current medications.  Current Outpatient Medications  Medication Sig Dispense Refill   amLODipine  (NORVASC ) 5 MG tablet TAKE 1 TABLET(5 MG) BY MOUTH DAILY 90 tablet 1   atorvastatin  (LIPITOR) 10 MG tablet Take 0.5 tablets (5 mg total) by mouth daily. 45 tablet 3   augmented betamethasone dipropionate (DIPROLENE-AF) 0.05 % cream APPLY TO THE AFFECTED AREAS OF LEG TWICE DAILY AS  NEEDED FOR RASH. NOT TO FACE, GROIN, UNDERARMS (Patient not taking: Reported on 02/16/2024) 50 g 0   betamethasone dipropionate 0.05 % cream Apply 1 Application topically.     cariprazine (VRAYLAR) 1.5 MG capsule every other day.     Cholecalciferol  (VITAMIN D3) 50 MCG  (2000 UT) TABS Take 2,000 mcg by mouth daily.     doxycycline  (VIBRA -TABS) 100 MG tablet Take 1 tablet (100 mg total) by mouth 2 (two) times daily. 14 tablet 0   Golimumab (SIMPONI ARIA IV) Infusion every 8 weeks     ipratropium (ATROVENT ) 0.03 % nasal spray Place 2 sprays into both nostrils every 12 (twelve) hours. 30 mL 12   levothyroxine  (SYNTHROID ) 75 MCG tablet Take 1 tablet (75 mcg total) by mouth daily. 90 tablet 3   LORazepam  (ATIVAN ) 0.5 MG tablet Take 1 tablet (0.5 mg total) by mouth 2 (two) times daily as needed. 60 tablet 2   metoprolol  succinate (TOPROL  XL) 25 MG 24 hr tablet Take 0.5 tablets (12.5 mg total) by mouth daily. 45 tablet 2   metroNIDAZOLE  (METROGEL ) 0.75 % gel Apply to face 1-2 times daily 45 g 1   mirtazapine  (REMERON ) 15 MG tablet Take 1 tablet (15 mg total) by mouth at bedtime. 90 tablet 0   Omega-3 Fatty Acids (FISH OIL) 1200 MG CAPS Take 1 capsule by mouth in the morning and at bedtime. (Patient not taking: Reported on 02/16/2024)     Probiotic Product (ALIGN) 4 MG CAPS Take 1 capsule by mouth daily in the afternoon. (Patient not taking: Reported on 02/16/2024)     No current facility-administered medications for this visit.    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 1.4 cm  Grade I invasive lobular, ER and PR +/Her--, Ki 67 <5% Tried anastrozole for one month, but developed suicidal idea   Osteoarthritis, knee 09/24/2019   Xray 09/2019   Parkinsonism (HCC)    Drug induced    Polyposis of colon    Psoriatic arthritis (HCC)    PTSD (post-traumatic stress disorder)    Secondary hyperparathyroidism    Seizures (HCC) 04/23/2019   diagnosed with epilepsy   Tardive dyskinesia    Thyroid  disease 2004   hypothyroidism    Past Medical History, Surgical history, Social history, and Family history were reviewed and updated as appropriate.   Please see review of systems for further details on the patient's review from today.   Objective:   Physical Exam:  There were no vitals taken for this visit.  Physical Exam Constitutional:      General: She is not in acute distress. Musculoskeletal:        General: No deformity.  Neurological:     Mental Status: She is alert and oriented to person, place, and time.     Coordination: Coordination normal.  Psychiatric:        Attention and Perception: Attention and perception normal. She does not perceive auditory or visual hallucinations.  Mood and Affect: Mood normal. Mood is not anxious or depressed. Affect is not labile, blunt, angry or inappropriate.        Speech: Speech normal.        Behavior: Behavior normal.        Thought Content: Thought content normal. Thought content is not paranoid or delusional. Thought content does not include homicidal or suicidal ideation. Thought content does not include homicidal or suicidal plan.        Cognition and Memory: Cognition and memory normal.        Judgment: Judgment normal.     Comments: Insight intact     Lab Review:     Component Value Date/Time   NA 140 11/27/2023 1424   K 4.3 11/27/2023 1424   CL 105 11/27/2023 1424   CO2 19 (L) 11/27/2023 1424   GLUCOSE 65 (L) 11/27/2023 1424   GLUCOSE 69 (L) 07/12/2022 1108   BUN 38 (H) 11/27/2023 1424   CREATININE 2.35 (H) 11/27/2023 1424   CREATININE 2.29 (H) 05/22/2021 1044   CALCIUM  9.1 11/27/2023 1424   PROT 7.1 07/15/2022 1515   ALBUMIN 4.4 07/15/2022 1515   AST 25 07/15/2022 1515   ALT 29 07/15/2022  1515   ALKPHOS 138 (H) 07/15/2022 1515   BILITOT 0.9 07/15/2022 1515   GFRNONAA 22 (L) 04/03/2022 1425   GFRAA 25 (L) 09/13/2019 1243       Component Value Date/Time   WBC 7.7 07/12/2022 1108   RBC 4.18 07/12/2022 1108   HGB 12.9 07/12/2022 1108   HCT 38.6 07/12/2022 1108   PLT 201.0 07/12/2022 1108   MCV 92.3 07/12/2022 1108   MCH 30.5 04/03/2022 1425   MCHC 33.4 07/12/2022 1108   RDW 13.5 07/12/2022 1108   LYMPHSABS 3.4 07/12/2022 1108   MONOABS 0.9 07/12/2022 1108   EOSABS 0.3 07/12/2022 1108   BASOSABS 0.1 07/12/2022 1108    Lithium  Lvl  Date Value Ref Range Status  05/22/2021 0.7 0.6 - 1.2 mmol/L Final     No results found for: PHENYTOIN, PHENOBARB, VALPROATE, CBMZ   .res Assessment: Plan:    Plan:  Continue: Remeron  7.5mg  for 2 months - 01/30/2024 - 03/31/2024 - may decreased to 3.75mg  after 2 months Vraylar 1.5mg  every other day - samples given  Lorazepam  0.5mg  TID - only taking one tablet at bedtime.  Ordered BMP - monitoring kidney function.  RTC 4 weeks  Discussed potential benefits, risk, and side effects of benzodiazepines to include potential risk of tolerance and dependence, as well as possible drowsiness.  Advised patient not to drive if experiencing drowsiness and to take lowest possible effective dose to minimize risk of dependence and tolerance.   25 minutes spent dedicated to the care of this patient on the date of this encounter to include pre-visit review of records, ordering of medication, post visit documentation, and face-to-face time with the patient discussing PTSD, insomnia, GAD and BPD- 1. Discussed continuing current taper regimen of Remeron .  There are no diagnoses linked to this encounter.   Please see After Visit Summary for patient specific instructions.  Future Appointments  Date Time Provider Department Center  02/24/2024  1:30 PM Sami Roes Nattalie, NP [facility] None  03/24/2024 10:00 AM Windy Dudek [place], NP  [facility] None  04/09/2024 11:00 AM Okey Vina GAILS, MD [facility]  04/12/2024 11:10 AM Shamleffer, Donell Cardinal, MD [facility] None  05/03/2024  1:40 PM [facility] ANNUAL WELLNESS VISIT 1 [facility] [place]    No orders of  the defined types were placed in this encounter.   -------------------------------     [1]  Allergies Allergen Reactions   Aripiprazole Other (See Comments)    Parkinsonism      Lactose Intolerance (Gi) Diarrhea   Methotrexate Other (See Comments)    Hair loss, severe stomatitis     Nsaids     CKD 4   Cefdinir Diarrhea    Other reaction(s): Diarrhea Yeast infection and fever; negative c diff   Etanercept Other (See Comments)    Headaches     Exemestane Other (See Comments)    Suicidal thoughts with medication     Fluoxetine Other (See Comments)    Parkinsonism   Methylprednisolone  Sodium Succ Other (See Comments)    Agitated mania   Other Other (See Comments)    Paper tape causes bruising.   Epinephrine  Palpitations    tachycardia    Nitrofurantoin Other (See Comments)     GI upset from 2012

## 2024-03-06 LAB — BASIC METABOLIC PANEL WITH GFR
BUN/Creatinine Ratio: 16 (ref 12–28)
BUN: 35 mg/dL — ABNORMAL HIGH (ref 8–27)
CO2: 21 mmol/L (ref 20–29)
Calcium: 9.3 mg/dL (ref 8.7–10.3)
Chloride: 105 mmol/L (ref 96–106)
Creatinine, Ser: 2.21 mg/dL — ABNORMAL HIGH (ref 0.57–1.00)
Glucose: 85 mg/dL (ref 70–99)
Potassium: 4.4 mmol/L (ref 3.5–5.2)
Sodium: 140 mmol/L (ref 134–144)
eGFR: 23 mL/min/1.73 — ABNORMAL LOW

## 2024-03-06 LAB — NMR, LIPOPROFILE
Cholesterol, Total: 131 mg/dL (ref 100–199)
HDL Particle Number: 26.1 umol/L — ABNORMAL LOW
HDL-C: 65 mg/dL
LDL Particle Number: 455 nmol/L
LDL Size: 21 nm
LDL-C (NIH Calc): 55 mg/dL (ref 0–99)
LP-IR Score: <25
Small LDL Particle Number: <90 nmol/L
Triglycerides: 50 mg/dL (ref 0–149)

## 2024-03-06 LAB — LIPOPROTEIN A (LPA): Lipoprotein (a): 29.6 nmol/L

## 2024-03-08 ENCOUNTER — Ambulatory Visit: Payer: Self-pay | Admitting: Internal Medicine

## 2024-03-23 ENCOUNTER — Telehealth: Payer: Self-pay | Admitting: Adult Health

## 2024-03-23 NOTE — Telephone Encounter (Signed)
 Tiffany [name] came by and said that she needs a refill on her mirtazapine  3,75 mg for eight weeks and she also needs a refill on her   lorazapam. Her script runes out 04/16/24. Please call her a [phone]

## 2024-03-23 NOTE — Telephone Encounter (Signed)
 Pt has appt tomorrow

## 2024-03-23 NOTE — Telephone Encounter (Signed)
 Lorazepam  LF 11/29.

## 2024-03-24 ENCOUNTER — Ambulatory Visit: Admitting: Adult Health

## 2024-03-24 ENCOUNTER — Encounter: Payer: Self-pay | Admitting: Adult Health

## 2024-03-24 DIAGNOSIS — F411 Generalized anxiety disorder: Secondary | ICD-10-CM

## 2024-03-24 DIAGNOSIS — F5105 Insomnia due to other mental disorder: Secondary | ICD-10-CM | POA: Diagnosis not present

## 2024-03-24 DIAGNOSIS — F99 Mental disorder, not otherwise specified: Secondary | ICD-10-CM

## 2024-03-24 DIAGNOSIS — F4310 Post-traumatic stress disorder, unspecified: Secondary | ICD-10-CM | POA: Diagnosis not present

## 2024-03-24 DIAGNOSIS — F319 Bipolar disorder, unspecified: Secondary | ICD-10-CM

## 2024-03-24 MED ORDER — LORAZEPAM 0.5 MG PO TABS: 0.5000 mg | ORAL_TABLET | Freq: Two times a day (BID) | ORAL | 2 refills | Status: AC | PRN

## 2024-03-24 MED ORDER — MIRTAZAPINE 7.5 MG PO TABS: ORAL_TABLET | ORAL | 1 refills | Status: AC

## 2024-03-24 NOTE — Progress Notes (Signed)
 Tiffany [name] [id number] [name] 15, 1951 74 y.o.  Virtual Visit via Telephone Note  I connected with pt on 03/24/2024 at 10:00 AM EST by telephone and verified that I am speaking with the correct person using two identifiers.   I discussed the limitations, risks, security and privacy concerns of performing an evaluation and management service by telephone and the availability of in person appointments. I also discussed with the patient that there may be a patient responsible charge related to this service. The patient expressed understanding and agreed to proceed.   I discussed the assessment and treatment plan with the patient. The patient was provided an opportunity to ask questions and all were answered. The patient agreed with the plan and demonstrated an understanding of the instructions.   The patient was advised to call back or seek an in-person evaluation if the symptoms worsen or if the condition fails to improve as anticipated.  I provided 25 minutes of non-face-to-face time during this encounter.  The patient was located at home.  The provider was located at [facility].   Tiffany LOISE Sayers, NP   Subjective:   Patient ID:  Tiffany [name] is a 74 y.o. (DOB 02-Dec-1950) female.  Chief Complaint: No chief complaint on file.   HPI Tiffany [name] presents for follow-up of PTSD, insomnia, GAD and BPD- 1.  Husband also on call.  Describes mood today as ok. Pleasant. Reports tearfulness at times. Mood symptoms - reports anxiety, depression and irritability - sometimes, but I have the ability to change that situation. Reports stable interest and motivation. Denies panic attacks. Reports some worry, rumination and over thinking - it doesn't get in my way. Denies obsessive thoughts. Denies racing thoughts. Reports mood is stable. Stating I feel like I'm doing well. Taking medications as prescribed.  Energy levels stable. Active, has a regular exercise routine. Enjoys some usual  interests and activities. Married. Lives with husband and daughter. Talking to family and friends.  Appetite adequate - eating healthy - renal diet. Weight loss - 154 pounds. Sleeps well most nights. Averages 10 to 12 hours. Denies daytime napping. Reports stable focus and concentration. Completing tasks. Reports reading daily. Managing aspects of household.  Denis SI. Denies HI.  Denies AH.  Denies VH. Denies self harm. Denies substance use.  Previous medications: Celexa, Zyprexa, Tegretol, Depakote , Serzone, Topamax, Seroquel, Effexor, Lexapro, Desipramine, Neurontin, Abilify, Geodon, Propanolol, Cymbalta, Cogentin, Trihexyphenadyl, Sinmmet, Provigil, Selegiline, Requip, Amantadine, Prozac, Mirapex, Azilect, Metoclopramide, Baclofen, Artane, Namenda, Latuda , Lithium ,     Review of Systems:  Review of Systems  Musculoskeletal:  Negative for gait problem.  Neurological:  Negative for tremors.  Psychiatric/Behavioral:         Please refer to HPI    Medications: I have reviewed the patient's current medications.  Current Outpatient Medications  Medication Sig Dispense Refill   amLODipine  (NORVASC ) 5 MG tablet TAKE 1 TABLET(5 MG) BY MOUTH DAILY 90 tablet 1   atorvastatin  (LIPITOR) 10 MG tablet Take 0.5 tablets (5 mg total) by mouth daily. 45 tablet 3   augmented betamethasone dipropionate (DIPROLENE-AF) 0.05 % cream APPLY TO THE AFFECTED AREAS OF LEG TWICE DAILY AS NEEDED FOR RASH. NOT TO FACE, GROIN, UNDERARMS (Patient not taking: Reported on 02/16/2024) 50 g 0   betamethasone dipropionate 0.05 % cream Apply 1 Application topically.     cariprazine (VRAYLAR) 1.5 MG capsule every other day.     Cholecalciferol  (VITAMIN D3) 50 MCG (2000 UT) TABS Take 2,000 mcg by mouth daily.  doxycycline  (VIBRA -TABS) 100 MG tablet Take 1 tablet (100 mg total) by mouth 2 (two) times daily. 14 tablet 0   Golimumab (SIMPONI ARIA IV) Infusion every 8 weeks     ipratropium (ATROVENT ) 0.03 % nasal spray  Place 2 sprays into both nostrils every 12 (twelve) hours. 30 mL 12   levothyroxine  (SYNTHROID ) 75 MCG tablet Take 1 tablet (75 mcg total) by mouth daily. 90 tablet 3   LORazepam  (ATIVAN ) 0.5 MG tablet Take 1 tablet (0.5 mg total) by mouth 2 (two) times daily as needed. 60 tablet 2   metoprolol  succinate (TOPROL  XL) 25 MG 24 hr tablet Take 0.5 tablets (12.5 mg total) by mouth daily. 45 tablet 2   metroNIDAZOLE  (METROGEL ) 0.75 % gel Apply to face 1-2 times daily 45 g 1   mirtazapine  (REMERON ) 7.5 MG tablet Take 1/2 tablet daily. 30 tablet 1   Omega-3 Fatty Acids (FISH OIL) 1200 MG CAPS Take 1 capsule by mouth in the morning and at bedtime. (Patient not taking: Reported on 02/16/2024)     Probiotic Product (ALIGN) 4 MG CAPS Take 1 capsule by mouth daily in the afternoon. (Patient not taking: Reported on 02/16/2024)     No current facility-administered medications for this visit.    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 1.4 cm  Grade I invasive lobular, ER and PR +/Her--, Ki 67 <5% Tried anastrozole for one month, but developed suicidal idea   Osteoarthritis, knee 09/24/2019   Xray 09/2019   Parkinsonism (HCC)    Drug induced   Polyposis of colon    Psoriatic arthritis (HCC)    PTSD (post-traumatic stress disorder)    Secondary hyperparathyroidism    Seizures (HCC) 04/23/2019   diagnosed with epilepsy   Tardive dyskinesia    Thyroid  disease 2004   hypothyroidism    Family History  Problem  Relation Age of Onset   Lymphoma Mother 16   Cancer Mother    Lymphoma Sister 31   Breast cancer Sister 57   Cancer Sister    Colon polyps Sister    Lung cancer Sister        former smoker   Cancer Sister    Diabetes Sister    Obesity Sister    Stroke Maternal Grandfather    Diabetes Paternal Grandfather    Colon cancer Neg Hx    Esophageal cancer Neg Hx    Stomach cancer Neg Hx    Rectal cancer Neg Hx     Social History   Socioeconomic History   Marital status: Married    Spouse name: Rona   Number of children: 3   Years of education: Not on file   Highest education level: Associate degree: occupational, scientist, product/process development, or vocational program  Occupational History   Occupation: retired  Tobacco Use   Smoking status: Never   Smokeless tobacco: Never  Vaping Use   Vaping status: Never Used  Substance and Sexual Activity   Alcohol use:  Never   Drug use: Never   Sexual activity: Not Currently  Other Topics Concern   Not on file  Social History Narrative   Moved to area from [place]  08/2018   Lives one story home   Right handed.   Social Drivers of Health   Tobacco Use: Low Risk (03/24/2024)   Patient History    Smoking Tobacco Use: Never    Smokeless Tobacco Use: Never    Passive Exposure: Not on file  Financial Resource Strain: Low Risk (02/16/2024)   Overall Financial Resource Strain (CARDIA)    Difficulty of Paying Living Expenses: Not hard at all  Food Insecurity: No Food Insecurity (02/16/2024)   Epic    Worried About Programme Researcher, Broadcasting/film/video in the Last Year: Never true    Ran Out of Food in the Last Year: Never true  Transportation Needs: No Transportation Needs (02/16/2024)   Epic    Lack of Transportation (Medical): No    Lack of Transportation (Non-Medical): No  Physical Activity: Sufficiently Active (02/16/2024)   Exercise Vital Sign    Days of Exercise per Week: 4 days    Minutes of Exercise per Session: 60 min  Stress: No Stress Concern Present  (02/16/2024)   [employer] of Occupational Health - Occupational Stress Questionnaire    Feeling of Stress: Not at all  Social Connections: Socially Integrated (02/16/2024)   Social Connection and Isolation Panel    Frequency of Communication with Friends and Family: More than three times a week    Frequency of Social Gatherings with Friends and Family: Once a week    Attends Religious Services: More than 4 times per year    Active Member of [employer] or Organizations: Yes    Attends Banker Meetings: More than 4 times per year    Marital Status: Married  Catering Manager Violence: Not At Risk (04/24/2023)   Humiliation, Afraid, Rape, and Kick questionnaire    Fear of Current or Ex-Partner: No    Emotionally Abused: No    Physically Abused: No    Sexually Abused: No  Depression (PHQ2-9): Low Risk (02/16/2024)   Depression (PHQ2-9)    PHQ-2 Score: 2  Alcohol Screen: Low Risk (04/24/2023)   Alcohol Screen    Last Alcohol Screening Score (AUDIT): 0  Housing: Low Risk (02/16/2024)   Epic    Unable to Pay for Housing in the Last Year: No    Number of Times Moved in the Last Year: 0    Homeless in the Last Year: No  Utilities: Not At Risk (04/24/2023)   [employer] Utilities    Threatened with loss of utilities: No  Health Literacy: Adequate Health Literacy (04/24/2023)   B1300 Health Literacy    Frequency of need for help with medical instructions: Never    Past Medical History, Surgical history, Social history, and Family history were reviewed and updated as appropriate.   Please see review of systems for further details on the patient's review from today.   Objective:   Physical Exam:  There were no vitals taken for this visit.  Physical Exam Constitutional:      General: She is not in acute distress. Musculoskeletal:        General: No deformity.  Neurological:     Mental Status: She is alert and oriented to person, place, and time.     Coordination: Coordination  normal.  Psychiatric:        Attention and Perception: Attention and perception normal. She  does not perceive auditory or visual hallucinations.        Mood and Affect: Mood normal. Mood is not anxious or depressed. Affect is not labile, blunt, angry or inappropriate.        Speech: Speech normal.        Behavior: Behavior normal.        Thought Content: Thought content normal. Thought content is not paranoid or delusional. Thought content does not include homicidal or suicidal ideation. Thought content does not include homicidal or suicidal plan.        Cognition and Memory: Cognition and memory normal.        Judgment: Judgment normal.     Comments: Insight intact     Lab Review:     Component Value Date/Time   NA 140 03/05/2024 1043   K 4.4 03/05/2024 1043   CL 105 03/05/2024 1043   CO2 21 03/05/2024 1043   GLUCOSE 85 03/05/2024 1043   GLUCOSE 69 (L) 07/12/2022 1108   BUN 35 (H) 03/05/2024 1043   CREATININE 2.21 (H) 03/05/2024 1043   CREATININE 2.29 (H) 05/22/2021 1044   CALCIUM  9.3 03/05/2024 1043   PROT 7.1 07/15/2022 1515   ALBUMIN 4.4 07/15/2022 1515   AST 25 07/15/2022 1515   ALT 29 07/15/2022 1515   ALKPHOS 138 (H) 07/15/2022 1515   BILITOT 0.9 07/15/2022 1515   GFRNONAA 22 (L) 04/03/2022 1425   GFRAA 25 (L) 09/13/2019 1243       Component Value Date/Time   WBC 7.7 07/12/2022 1108   RBC 4.18 07/12/2022 1108   HGB 12.9 07/12/2022 1108   HCT 38.6 07/12/2022 1108   PLT 201.0 07/12/2022 1108   MCV 92.3 07/12/2022 1108   MCH 30.5 04/03/2022 1425   MCHC 33.4 07/12/2022 1108   RDW 13.5 07/12/2022 1108   LYMPHSABS 3.4 07/12/2022 1108   MONOABS 0.9 07/12/2022 1108   EOSABS 0.3 07/12/2022 1108   BASOSABS 0.1 07/12/2022 1108    Lithium  Lvl  Date Value Ref Range Status  05/22/2021 0.7 0.6 - 1.2 mmol/L Final     No results found for: PHENYTOIN, PHENOBARB, VALPROATE, CBMZ   .res Assessment: Plan:    Plan:  Change: Remeron  - reduce 7.5mg  at hs to  3.75mg  at hs for 2 months  Continue: Vraylar 1.5mg  every other day - samples given  Lorazepam  0.5mg  TID - only taking one tablet at bedtime.  Ordered BMP - monitoring kidney function.  RTC 4 weeks  Discussed potential benefits, risk, and side effects of benzodiazepines to include potential risk of tolerance and dependence, as well as possible drowsiness.  Advised patient not to drive if experiencing drowsiness and to take lowest possible effective dose to minimize risk of dependence and tolerance.   25 minutes spent dedicated to the care of this patient on the date of this encounter to include pre-visit review of records, ordering of medication, post visit documentation, and face-to-face time with the patient discussing PTSD, insomnia, GAD and BPD- 1. Discussed continuing current taper regimen of Remeron  7.5 to 3.75 over the next 2 months.  Diagnoses and all orders for this visit:  Bipolar I disorder (HCC) -     mirtazapine  (REMERON ) 7.5 MG tablet; Take 1/2 tablet daily.  Generalized anxiety disorder -     LORazepam  (ATIVAN ) 0.5 MG tablet; Take 1 tablet (0.5 mg total) by mouth 2 (two) times daily as needed.  PTSD (post-traumatic stress disorder)  Insomnia due to other mental disorder    Please see  After Visit Summary for patient specific instructions.  Future Appointments  Date Time Provider Department Center  04/09/2024 11:00 AM Okey Vina GAILS, MD [facility]  04/12/2024 11:10 AM Shamleffer, Donell Cardinal, MD [facility] None  04/27/2024  1:00 PM Dynasty Holquin, Tiffany Mattocks, NP [facility] None  05/03/2024  1:40 PM [facility] ANNUAL WELLNESS VISIT 1 [facility] [place]    No orders of the defined types were placed in this encounter.     -------------------------------     [1]  Allergies Allergen Reactions   Aripiprazole Other (See Comments)    Parkinsonism      Lactose Intolerance (Gi) Diarrhea   Methotrexate Other (See Comments)    Hair loss, severe stomatitis      Nsaids     CKD 4   Cefdinir Diarrhea    Other reaction(s): Diarrhea Yeast infection and fever; negative c diff   Etanercept Other (See Comments)    Headaches     Exemestane Other (See Comments)    Suicidal thoughts with medication     Fluoxetine Other (See Comments)    Parkinsonism   Methylprednisolone  Sodium Succ Other (See Comments)    Agitated mania   Other Other (See Comments)    Paper tape causes bruising.   Epinephrine  Palpitations    tachycardia    Nitrofurantoin Other (See Comments)     GI upset from 2012

## 2024-04-05 NOTE — Progress Notes (Signed)
 "   Cardiology Office Note   Date:  04/09/2024   ID:  Tiffany, [name] [birth year], MRN [id number]  PCP:  Job Lukes, PA  Cardiologist:   Vina Gull, MD   Pt presents for follow up of dyspnea   History of Present Illness: Tiffany [name] is a 74 y.o. female with a history of dyspnea, bipolar disorder, CKD, tardive dyskinesia   The pt is followed by GORMAN Job PA   2024 Echo was normal    2024 Monitor showed no significant arrhythmias     March 2024  Myoview  scan  showed normal perfusion  Mild TID  1.22   When I saw her in Aug 2024 she was still complaining of SOB with exertion   I recomm [facility] to define anatomy  SHe did not have this done   I saw the pt in April 2025 Dec 2025  LIpids   LDL 55  HDL 654  Trig 50  Lpa 29    Since seen the pt says she is doing well    Breathing  is good   Walks a lot   No CP   NO dizziness    Renal function   Cr 2.21   Pt concerned about meds, can she back off of any   Statin??  Current Meds  Medication Sig   amLODipine  (NORVASC ) 5 MG tablet TAKE 1 TABLET(5 MG) BY MOUTH DAILY   atorvastatin  (LIPITOR) 10 MG tablet Take 0.5 tablets (5 mg total) by mouth daily.   augmented betamethasone dipropionate (DIPROLENE-AF) 0.05 % cream APPLY TO THE AFFECTED AREAS OF LEG TWICE DAILY AS NEEDED FOR RASH. NOT TO FACE, GROIN, UNDERARMS   betamethasone dipropionate 0.05 % cream Apply 1 Application topically.   cariprazine (VRAYLAR) 1.5 MG capsule every other day.   Cholecalciferol  (VITAMIN D3) 50 MCG (2000 UT) TABS Take 2,000 mcg by mouth daily.   Cholecalciferol  50 MCG (2000 UT) TABS 1 tablet Orally Once a day; Duration: 30 day(s)   Golimumab (SIMPONI ARIA IV) Infusion every 8 weeks   ipratropium (ATROVENT ) 0.03 % nasal spray Place 2 sprays into both nostrils every 12 (twelve) hours.   levothyroxine  (SYNTHROID ) 75 MCG tablet Take 1 tablet (75 mcg total) by mouth daily.   LORazepam  (ATIVAN ) 0.5 MG tablet Take 1 tablet (0.5 mg total) by mouth 2 (two) times daily as  needed.   metoprolol  succinate (TOPROL  XL) 25 MG 24 hr tablet Take 0.5 tablets (12.5 mg total) by mouth daily.   metroNIDAZOLE  (METROGEL ) 0.75 % gel Apply to face 1-2 times daily   mirtazapine  (REMERON ) 7.5 MG tablet Take 1/2 tablet daily.     Allergies:   Aripiprazole, Lactose intolerance (gi), Methotrexate, Nsaids, Cefdinir, Etanercept, Exemestane, Fluoxetine, Methylprednisolone  sodium succ, Other, Epinephrine , and Nitrofurantoin   Past Medical History:  Diagnosis Date   Bipolar 1 disorder (HCC)    Cataract 02/09/2019   Chronic diarrhea    loose stools twice a day on average for years   CKD stage G4/A1, 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 1.4 cm  Grade I invasive lobular, ER and PR +/Her--, Ki 67 <5% Tried anastrozole for one month, but developed suicidal idea   Osteoarthritis, knee 09/24/2019   Xray  09/2019   Parkinsonism (HCC)    Drug induced   Polyposis of colon    Psoriatic arthritis (HCC)    PTSD (post-traumatic stress disorder)    Secondary hyperparathyroidism    Seizures (HCC) 04/23/2019   diagnosed with epilepsy   Tardive dyskinesia    Thyroid  disease 2004   hypothyroidism    Past Surgical History:  Procedure Laterality Date   ABDOMINAL HYSTERECTOMY  1987   for prolapsed uterus   BREAST SURGERY  09/19/2014   bilateral mastectomy   CARDIAC CATHETERIZATION  approximately 1975   uncertain of reason   CHOLECYSTECTOMY  1979   DILATION AND CURETTAGE OF UTERUS  1973   EYE SURGERY  09/07/2020, 09/21/2020   cataract surgery   MASTECTOMY Bilateral 09/19/2014   TONSILLECTOMY  1970   URETERAL REIMPLANTION Bilateral 1974     Social History:  The patient  reports that she has never smoked. She has never used  smokeless tobacco. She reports that she does not drink alcohol and does not use drugs.   Family History:  The patient's family history includes Breast cancer (age of onset: 38) in her sister; Cancer in her mother, sister, and sister; Colon polyps in her sister; Diabetes in her paternal grandfather and sister; Lung cancer in her sister; Lymphoma (age of onset: 17) in her sister; Lymphoma (age of onset: 60) in her mother; Obesity in her sister; Stroke in her maternal grandfather.    ROS:  Please see the history of present illness. All other systems are reviewed and  Negative to the above problem except as noted.    PHYSICAL EXAM: VS:  BP 111/77 (BP Location: Right Arm, Patient Position: Sitting, Cuff Size: Normal)   Pulse 68   Resp 17   Ht 5' 4 (1.626 m)   Wt 158 lb (71.7 kg)   SpO2 100%   BMI 27.12 kg/m   GEN: Overweight 74 yo  in no acute distress  HEENT: normal  Neck: no JVD,,  Cardiac: RRR; no murmurs   Respiratory:  clear to auscultation  GI: soft, nontender No hepatomegaly   Ext  No LE edema   EKG:  EKG is not ordered today.    Lipid Panel    Component Value Date/Time   CHOL 182 12/10/2021 1044   TRIG 89.0 12/10/2021 1044   HDL 59.90 12/10/2021 1044   CHOLHDL 3 12/10/2021 1044   VLDL 17.8 12/10/2021 1044   LDLCALC 105 (H) 12/10/2021 1044      Wt Readings from Last 3 Encounters:  04/09/24 158 lb (71.7 kg)  02/16/24 156 lb 12.8 oz (71.1 kg)  10/24/23 163 lb (73.9 kg)      ASSESSMENT AND PLAN:  1  Dypsnea  Long hx of dyspnea  I am not convinced cardiac    Will set up for noncontrast CT to look at lungs, coronary calcifications  Keep active     2  HL On atorvastatin   Lipids are excellent  LDL 55 HDL 65  Trig 50  QUestions need for this     3 HTN  BP remains well controlled   Follow   Keep on amlodipine    4  Hx palpitations   Pt on Toprol  XL 12.5 mg    Doing well  No complaints  5 Renal  Cr  was 2.2 on last check    Follows at [facility] in nephrology     6   Metabolics   A1C is 5.2     Follow up in  Dec 2026  Sooner for problems    Current medicines are reviewed at length with the patient today.  The patient does not have concerns regarding medicines.  Signed, Vina Gull, MD   "

## 2024-04-09 ENCOUNTER — Encounter: Payer: Self-pay | Admitting: Internal Medicine

## 2024-04-09 ENCOUNTER — Ambulatory Visit: Attending: Internal Medicine | Admitting: Internal Medicine

## 2024-04-09 VITALS — BP 111/77 | HR 68 | Resp 17 | Ht 64.0 in | Wt 158.0 lb

## 2024-04-09 DIAGNOSIS — R002 Palpitations: Secondary | ICD-10-CM | POA: Insufficient documentation

## 2024-04-09 DIAGNOSIS — R0602 Shortness of breath: Secondary | ICD-10-CM | POA: Insufficient documentation

## 2024-04-09 NOTE — Patient Instructions (Addendum)
 Medication Instructions:   No changes  *If you need a refill on your cardiac medications before your next appointment, please call your pharmacy*   Lab Work: Not needed    Testing/Procedures:  Non-Cardiac CT scanning, (CAT scanning), is a noninvasive, special x-ray that produces cross-sectional images of the body using x-rays and a computer. CT scans help physicians diagnose and treat medical conditions. For some CT exams, a contrast material is used to enhance visibility in the area of the body being studied. CT scans provide greater clarity and reveal more details than regular x-ray exams.   Follow-Up: At [facility], you and your health needs are our priority.  As part of our continuing mission to provide you with exceptional heart care, we have created designated Provider Care Teams.  These Care Teams include your primary Cardiologist (physician) and Advanced Practice Providers (APPs -  Physician Assistants and Nurse Practitioners) who all work together to provide you with the care you need, when you need it.     Your next appointment:    Dec 2026  The format for your next appointment:   In Person  Provider:   Vina Gull, MD

## 2024-04-12 ENCOUNTER — Telehealth: Admitting: Internal Medicine

## 2024-04-13 ENCOUNTER — Telehealth: Admitting: Internal Medicine

## 2024-04-13 ENCOUNTER — Encounter: Payer: Self-pay | Admitting: Internal Medicine

## 2024-04-13 VITALS — Ht 64.0 in | Wt 158.0 lb

## 2024-04-13 DIAGNOSIS — E039 Hypothyroidism, unspecified: Secondary | ICD-10-CM

## 2024-04-14 ENCOUNTER — Other Ambulatory Visit

## 2024-04-15 ENCOUNTER — Other Ambulatory Visit: Payer: Self-pay | Admitting: Internal Medicine

## 2024-04-15 ENCOUNTER — Ambulatory Visit: Payer: Self-pay | Admitting: Internal Medicine

## 2024-04-15 LAB — T4, FREE: Free T4: 1.4 ng/dL (ref 0.8–1.8)

## 2024-04-15 LAB — TSH: TSH: 1.06 m[IU]/L (ref 0.40–4.50)

## 2024-04-15 MED ORDER — LEVOTHYROXINE SODIUM 75 MCG PO TABS: 75.0000 ug | ORAL_TABLET | Freq: Every day | ORAL | 3 refills | Status: AC

## 2024-04-22 ENCOUNTER — Other Ambulatory Visit

## 2024-04-27 ENCOUNTER — Ambulatory Visit: Admitting: Adult Health

## 2024-05-03 ENCOUNTER — Ambulatory Visit: Payer: Medicare Other

## 2024-05-28 ENCOUNTER — Ambulatory Visit: Admitting: Adult Health

## 2024-10-11 ENCOUNTER — Ambulatory Visit: Admitting: Internal Medicine
